# Patient Record
Sex: Male | Born: 1940
Health system: Southern US, Community
[De-identification: ages and names within clinical notes are randomized; demographics above are authoritative.]

## PROBLEM LIST (undated history)

## (undated) DIAGNOSIS — M1990 Unspecified osteoarthritis, unspecified site: Secondary | ICD-10-CM

## (undated) DIAGNOSIS — R748 Abnormal levels of other serum enzymes: Secondary | ICD-10-CM

## (undated) DIAGNOSIS — Z9289 Personal history of other medical treatment: Secondary | ICD-10-CM

## (undated) DIAGNOSIS — D696 Thrombocytopenia, unspecified: Secondary | ICD-10-CM

## (undated) DIAGNOSIS — K5790 Diverticulosis of intestine, part unspecified, without perforation or abscess without bleeding: Secondary | ICD-10-CM

## (undated) DIAGNOSIS — D869 Sarcoidosis, unspecified: Secondary | ICD-10-CM

## (undated) DIAGNOSIS — I10 Essential (primary) hypertension: Secondary | ICD-10-CM

## (undated) DIAGNOSIS — H18519 Endothelial corneal dystrophy, unspecified eye: Secondary | ICD-10-CM

## (undated) DIAGNOSIS — J42 Unspecified chronic bronchitis: Secondary | ICD-10-CM

## (undated) DIAGNOSIS — J189 Pneumonia, unspecified organism: Secondary | ICD-10-CM

## (undated) DIAGNOSIS — D500 Iron deficiency anemia secondary to blood loss (chronic): Secondary | ICD-10-CM

## (undated) DIAGNOSIS — E785 Hyperlipidemia, unspecified: Secondary | ICD-10-CM

## (undated) DIAGNOSIS — I453 Trifascicular block: Secondary | ICD-10-CM

## (undated) DIAGNOSIS — C61 Malignant neoplasm of prostate: Secondary | ICD-10-CM

## (undated) DIAGNOSIS — Z86010 Personal history of colonic polyps: Secondary | ICD-10-CM

## (undated) DIAGNOSIS — C4491 Basal cell carcinoma of skin, unspecified: Secondary | ICD-10-CM

## (undated) DIAGNOSIS — H1851 Endothelial corneal dystrophy: Secondary | ICD-10-CM

## (undated) DIAGNOSIS — C180 Malignant neoplasm of cecum: Secondary | ICD-10-CM

## (undated) DIAGNOSIS — I2510 Atherosclerotic heart disease of native coronary artery without angina pectoris: Secondary | ICD-10-CM

## (undated) HISTORY — DX: Abnormal levels of other serum enzymes: R74.8

## (undated) HISTORY — DX: Iron deficiency anemia secondary to blood loss (chronic): D50.0

## (undated) HISTORY — PX: CORONARY ANGIOPLASTY WITH STENT PLACEMENT: SHX49

## (undated) HISTORY — PX: MOHS SURGERY: SUR867

## (undated) HISTORY — DX: Hyperlipidemia, unspecified: E78.5

## (undated) HISTORY — DX: Malignant neoplasm of cecum: C18.0

## (undated) HISTORY — DX: Essential (primary) hypertension: I10

## (undated) HISTORY — DX: Trifascicular block: I45.3

## (undated) HISTORY — DX: Thrombocytopenia, unspecified: D69.6

## (undated) HISTORY — DX: Sarcoidosis, unspecified: D86.9

## (undated) HISTORY — PX: CATARACT EXTRACTION W/ INTRAOCULAR LENS  IMPLANT, BILATERAL: SHX1307

## (undated) HISTORY — PX: TONSILLECTOMY: SUR1361

## (undated) HISTORY — PX: LYMPH NODE BIOPSY: SHX201

## (undated) HISTORY — PX: INSERTION PROSTATE RADIATION SEED: SUR718

## (undated) HISTORY — DX: Diverticulosis of intestine, part unspecified, without perforation or abscess without bleeding: K57.90

## (undated) HISTORY — DX: Personal history of colonic polyps: Z86.010

## (undated) HISTORY — PX: BASAL CELL CARCINOMA EXCISION: SHX1214

## (undated) HISTORY — PX: THOROCOTOMY WITH LOBECTOMY: SHX6118

## (undated) HISTORY — PX: CORNEAL TRANSPLANT: SHX108

## (undated) HISTORY — PX: EYE SURGERY: SHX253

## (undated) HISTORY — PX: COLONOSCOPY W/ POLYPECTOMY: SHX1380

## (undated) SURGERY — THORACENTESIS
Anesthesia: LOCAL | Laterality: Right

---

## 2000-02-07 ENCOUNTER — Ambulatory Visit: Admission: RE | Admit: 2000-02-07 | Discharge: 2000-02-07 | Payer: Self-pay | Admitting: Critical Care Medicine

## 2003-09-25 ENCOUNTER — Encounter: Admission: RE | Admit: 2003-09-25 | Discharge: 2003-09-25 | Payer: Self-pay | Admitting: Critical Care Medicine

## 2003-12-17 ENCOUNTER — Emergency Department (HOSPITAL_COMMUNITY): Admission: EM | Admit: 2003-12-17 | Discharge: 2003-12-17 | Payer: Self-pay | Admitting: Emergency Medicine

## 2003-12-25 ENCOUNTER — Emergency Department (HOSPITAL_COMMUNITY): Admission: EM | Admit: 2003-12-25 | Discharge: 2003-12-25 | Payer: Self-pay

## 2004-10-31 ENCOUNTER — Ambulatory Visit: Payer: Self-pay

## 2004-10-31 ENCOUNTER — Encounter: Payer: Self-pay | Admitting: Internal Medicine

## 2005-05-12 DIAGNOSIS — Z860101 Personal history of adenomatous and serrated colon polyps: Secondary | ICD-10-CM

## 2005-05-12 DIAGNOSIS — K5790 Diverticulosis of intestine, part unspecified, without perforation or abscess without bleeding: Secondary | ICD-10-CM

## 2005-05-12 DIAGNOSIS — Z86010 Personal history of colonic polyps: Secondary | ICD-10-CM

## 2005-05-12 HISTORY — DX: Diverticulosis of intestine, part unspecified, without perforation or abscess without bleeding: K57.90

## 2005-05-12 HISTORY — DX: Personal history of adenomatous and serrated colon polyps: Z86.0101

## 2005-05-12 HISTORY — DX: Personal history of colonic polyps: Z86.010

## 2005-05-26 ENCOUNTER — Ambulatory Visit: Payer: Self-pay | Admitting: Internal Medicine

## 2005-06-08 ENCOUNTER — Encounter (INDEPENDENT_AMBULATORY_CARE_PROVIDER_SITE_OTHER): Payer: Self-pay | Admitting: Specialist

## 2005-06-08 ENCOUNTER — Ambulatory Visit: Payer: Self-pay | Admitting: Internal Medicine

## 2008-05-29 ENCOUNTER — Emergency Department: Payer: Self-pay | Admitting: Emergency Medicine

## 2009-06-12 DIAGNOSIS — C61 Malignant neoplasm of prostate: Secondary | ICD-10-CM

## 2009-06-12 HISTORY — DX: Malignant neoplasm of prostate: C61

## 2009-07-06 ENCOUNTER — Ambulatory Visit: Admission: RE | Admit: 2009-07-06 | Discharge: 2009-10-04 | Payer: Self-pay | Admitting: Radiation Oncology

## 2009-07-26 ENCOUNTER — Encounter: Admission: RE | Admit: 2009-07-26 | Discharge: 2009-07-26 | Payer: Self-pay | Admitting: Urology

## 2009-08-30 ENCOUNTER — Ambulatory Visit (HOSPITAL_BASED_OUTPATIENT_CLINIC_OR_DEPARTMENT_OTHER): Admission: RE | Admit: 2009-08-30 | Discharge: 2009-08-30 | Payer: Self-pay | Admitting: Urology

## 2009-10-20 ENCOUNTER — Ambulatory Visit: Admission: RE | Admit: 2009-10-20 | Discharge: 2009-10-24 | Payer: Self-pay | Admitting: Radiation Oncology

## 2010-09-05 LAB — COMPREHENSIVE METABOLIC PANEL
AST: 32 U/L (ref 0–37)
Albumin: 4.1 g/dL (ref 3.5–5.2)
Alkaline Phosphatase: 53 U/L (ref 39–117)
BUN: 17 mg/dL (ref 6–23)
CO2: 31 mEq/L (ref 19–32)
Chloride: 106 mEq/L (ref 96–112)
Creatinine, Ser: 0.92 mg/dL (ref 0.4–1.5)
GFR calc non Af Amer: >60 mL/min (ref >60)
Potassium: 4.4 mEq/L (ref 3.5–5.1)
Total Bilirubin: 0.9 mg/dL (ref 0.3–1.2)

## 2010-09-05 LAB — APTT: aPTT: 36 seconds (ref 24–37)

## 2010-09-05 LAB — CBC
MCV: 96.4 fL (ref 78.0–100.0)
Platelets: 134 10*3/uL — ABNORMAL LOW (ref 150–400)
WBC: 5.7 10*3/uL (ref 4.0–10.5)

## 2010-09-05 LAB — PROTIME-INR: Prothrombin Time: 12.8 seconds (ref 11.6–15.2)

## 2010-10-04 ENCOUNTER — Ambulatory Visit (AMBULATORY_SURGERY_CENTER): Payer: Medicare Other

## 2010-10-04 VITALS — Ht 72.0 in | Wt 226.0 lb

## 2010-10-04 DIAGNOSIS — Z86010 Personal history of colonic polyps: Secondary | ICD-10-CM

## 2010-10-04 MED ORDER — PEG-KCL-NACL-NASULF-NA ASC-C 100 G PO SOLR: 1.0000 | Freq: Once | ORAL | Status: AC

## 2010-10-17 ENCOUNTER — Encounter: Payer: Self-pay | Admitting: Internal Medicine

## 2010-10-18 ENCOUNTER — Encounter: Payer: Self-pay | Admitting: Internal Medicine

## 2010-10-18 ENCOUNTER — Ambulatory Visit (AMBULATORY_SURGERY_CENTER): Payer: Medicare Other | Admitting: Internal Medicine

## 2010-10-18 VITALS — HR 72 | Temp 97.9°F | Resp 16 | Ht 72.0 in | Wt 216.0 lb

## 2010-10-18 DIAGNOSIS — Z1211 Encounter for screening for malignant neoplasm of colon: Secondary | ICD-10-CM

## 2010-10-18 DIAGNOSIS — K5730 Diverticulosis of large intestine without perforation or abscess without bleeding: Secondary | ICD-10-CM

## 2010-10-18 DIAGNOSIS — Z86010 Personal history of colonic polyps: Secondary | ICD-10-CM

## 2010-10-18 MED ORDER — SODIUM CHLORIDE 0.9 % IV SOLN: 500.0000 mL | INTRAVENOUS | Status: DC

## 2010-10-18 NOTE — Patient Instructions (Signed)
Discharged instructions given with verbal understanding.  Handouts on diverticulosis and hemorrhoids given. Resume previous medications.

## 2010-10-19 ENCOUNTER — Telehealth: Payer: Self-pay | Admitting: *Deleted

## 2010-10-19 NOTE — Telephone Encounter (Signed)
Follow up Call- Patient questions:  Do you have a fever, pain , or abdominal swelling? no Pain Score  0 *  Have you tolerated food without any problems? yes  Have you been able to return to your normal activities? yes  Do you have any questions about your discharge instructions: Diet   no Medications  no Follow up visit  no  Do you have questions or concerns about your Care? no  Actions: * If pain score is 4 or above: No action needed, pain <4.  Patient states he had small amt of blood in stool this morning. He said it was a very small amt and no other symptoms. He did not seemed concern with this but i did encourage him to call us back if the continues or if it is greater amt. He understands. Sherren Kerns

## 2012-01-18 ENCOUNTER — Ambulatory Visit
Admission: RE | Admit: 2012-01-18 | Discharge: 2012-01-18 | Disposition: A | Discharge status: Home / Self Care | Payer: Medicare Other | Source: Ambulatory Visit | Attending: Physician Assistant | Admitting: Physician Assistant

## 2012-01-18 ENCOUNTER — Other Ambulatory Visit: Payer: Self-pay | Admitting: Physician Assistant

## 2012-01-18 DIAGNOSIS — R55 Syncope and collapse: Secondary | ICD-10-CM

## 2012-01-18 MED ORDER — IOHEXOL 300 MG/ML  SOLN
75.0000 mL | Freq: Once | INTRAMUSCULAR | Status: AC | PRN
  Administered 2012-01-18: 75 mL via INTRAVENOUS

## 2012-01-23 ENCOUNTER — Other Ambulatory Visit: Payer: Self-pay | Admitting: Physician Assistant

## 2012-01-23 DIAGNOSIS — H538 Other visual disturbances: Secondary | ICD-10-CM

## 2012-01-24 ENCOUNTER — Ambulatory Visit
Admission: RE | Admit: 2012-01-24 | Discharge: 2012-01-24 | Disposition: A | Discharge status: Home / Self Care | Payer: Medicare Other | Source: Ambulatory Visit | Attending: Physician Assistant | Admitting: Physician Assistant

## 2012-01-24 ENCOUNTER — Other Ambulatory Visit: Payer: Self-pay | Admitting: Physician Assistant

## 2012-01-24 DIAGNOSIS — H538 Other visual disturbances: Secondary | ICD-10-CM

## 2012-01-24 MED ORDER — GADOBENATE DIMEGLUMINE 529 MG/ML IV SOLN
20.0000 mL | Freq: Once | INTRAVENOUS | Status: AC | PRN
  Administered 2012-01-24: 20 mL via INTRAVENOUS

## 2012-01-29 ENCOUNTER — Encounter: Payer: Self-pay | Admitting: Cardiology

## 2012-01-29 ENCOUNTER — Ambulatory Visit (INDEPENDENT_AMBULATORY_CARE_PROVIDER_SITE_OTHER): Payer: Medicare Other | Admitting: Cardiology

## 2012-01-29 VITALS — BP 160/60 | HR 68 | Ht 72.0 in | Wt 229.8 lb

## 2012-01-29 DIAGNOSIS — R55 Syncope and collapse: Secondary | ICD-10-CM

## 2012-01-29 NOTE — Progress Notes (Signed)
HPI The patient presents as a new patient for evaluation of syncope. He has no past cardiac history though he has significant cardiovascular risk factors. About 2 weeks ago he was playing racket ball. He saw everything going gray. He then slumped against the back wall and slid down to the ground almost completely losing consciousness but not quite. He did have an evaluation which included a head CT which I have reviewed which demonstrated some small vessel mild ischemic changes. A few days later he tried to play again and during the second game had some lightheadedness. Evaluation following this included an MRI MRA which demonstrated some mild left anterior cerebral artery narrowing, narrowing of the proximal right subclavian and narrowing of the right internal carotid artery. There were some changes consistent with his previous history of sarcoid but no obvious etiology for the event.  The patient does not report any palpitations. He has had no chest pressure, neck or arm discomfort. He has had no new shortness of breath, PND or orthopnea. He has had no weight gain or edema.  No Known Allergies  Current Outpatient Prescriptions  Medication Sig Dispense Refill  . aspirin 81 MG tablet Take 81 mg by mouth daily.        . celecoxib (CELEBREX) 200 MG capsule Take 200 mg by mouth daily.      . Ezetimibe-Simvastatin (VYTORIN PO) Take 1 tablet by mouth daily.        [name] glucosamine-chondroitin 500-400 MG tablet Take 1 tablet by mouth daily.       [name] KRILL OIL 1000 MG CAPS Take 1,000 mg by mouth daily.      [name] LEVITRA 20 MG tablet 1 tablet as needed.      [name] losartan (COZAAR) 50 MG tablet Take 50 mg by mouth daily.        . Multiple Vitamins-Minerals (MULTIVITAMIN WITH IRON-MINERALS) liquid Take by mouth daily. Centrum mens MVI Daily        Current Facility-Administered Medications  Medication Dose Route Frequency Provider Last Rate Last Dose  . 0.9 %  sodium chloride infusion  500 mL Intravenous  Continuous Iva Boop, MD        Past Medical History  Diagnosis Date  . Hypertension   . Hyperlipidemia   . Sarcoidosis   . Cataract   . Skin cancer     left shoulder.and mid chest  . History of adenomatous polyp of colon 05/2005    8 mm adenoma    Past Surgical History  Procedure Date  . Tosilectomy   . Lung surgery     partial removal right lung  . Colonoscopy w/ polypectomy 06/08/2005 and 10/18/2010    8 mm adenoma 2006, none 2012. Diverticulosis and internal hemorrhoids.  . Eye surgery     CARCINOMA RIGHT EYELID  . Cataract extraction     WITH CORNEAL TRANSPLANT/ bil  . Lymph node biopsy     mid chest  . Prostate surgery     radiation seed implant  . Corneal transplant     x 2    Family History  Problem Relation Age of Onset  . Heart disease Father 12    Died from "hardening of the arteries" age 71  . Coronary artery disease Sister 87    History   Social History  . Marital Status: Married    Spouse Name: N/A    Number of Children: 2  . Years of Education: N/A   Occupational History  .  Unemployed    Social History Main Topics  . Smoking status: Current Some Day Smoker -- 0.5 packs/day for 45 years    Types: Cigarettes  . Smokeless tobacco: Never Used   Comment: Quit cigarettes 7 years ago but smokes cigars infrequently  . Alcohol Use: 0.5 oz/week    1 drink(s) per week  . Drug Use: No  . Sexually Active: Not on file   Other Topics Concern  . Not on file   Social History Narrative   Lives at home wife.      ROS:  Night sweats.  Otherwise as stated in the HPI and negative for all other systems.  PHYSICAL EXAM BP 160/60  Pulse 68  Ht 6' (1.829 m)  Wt 229 lb 12.8 oz (104.237 kg)  BMI 31.17 kg/m2 GENERAL:  Well appearing HEENT:  Pupils equal round and reactive, fundi not visualized, oral mucosa unremarkable NECK:  No jugular venous distention, waveform within normal limits, carotid upstroke brisk and symmetric, no bruits, no  thyromegaly LYMPHATICS:  No cervical, inguinal adenopathy LUNGS:  Clear to auscultation bilaterally BACK:  No CVA tenderness CHEST:  Unremarkable HEART:  PMI not displaced or sustained,S1 and S2 within normal limits, no S3, no S4, no clicks, no rubs, no murmurs ABD:  Flat, positive bowel sounds normal in frequency in pitch, no bruits, no rebound, no guarding, no midline pulsatile mass, no hepatomegaly, no splenomegaly EXT:  2 plus pulses throughout, no edema, no cyanosis no clubbing SKIN:  No rashes no nodules NEURO:  Cranial nerves II through XII grossly intact, motor grossly intact throughout PSYCH:  Cognitively intact, oriented to person place and time  EKG:  Sinus rhythm, right bundle branch block left anterior fascicular block, rate 68, no acute ST-T wave changes.  01/29/2012  ASSESSMENT AND PLAN  Syncope - The etiology of this patient's event is not clear. He does have significant cardiovascular risk factors.  At this point I will bring him back for an exercise perfusion study. I will also apply a 21 day event monitor particularly given the conduction disturbance on his EKG. Further evaluation will be based on these results.  Hypertension - His blood pressure is elevated today but he says he is otherwise not typically elevated. He will keep a blood pressure diary.  Tobacco use -  We discussed the need to stop smoking all tobacco.

## 2012-01-29 NOTE — Patient Instructions (Addendum)
The current medical regimen is effective;  continue present plan and medications.  Your physician has requested that you have a lexiscan myoview. For further information please visit [email or web address] Please follow instruction sheet, as given.  Your physician has recommended that you wear an event monitor for 21 days. Event monitors are medical devices that record the heart's electrical activity. Doctors most often [place] these monitors to diagnose arrhythmias. Arrhythmias are problems with the speed or rhythm of the heartbeat. The monitor is a small, portable device. You can wear one while you do your normal daily activities. This is usually used to diagnose what is causing palpitations/syncope (passing out).  Follow up with Dr Antoine Poche after all testing has been completed.

## 2012-02-08 ENCOUNTER — Ambulatory Visit (HOSPITAL_COMMUNITY): Payer: Medicare Other | Attending: Cardiovascular Disease | Admitting: Radiology

## 2012-02-08 ENCOUNTER — Encounter (INDEPENDENT_AMBULATORY_CARE_PROVIDER_SITE_OTHER): Payer: Medicare Other

## 2012-02-08 VITALS — BP 148/80 | Ht 72.0 in | Wt 225.0 lb

## 2012-02-08 DIAGNOSIS — R55 Syncope and collapse: Secondary | ICD-10-CM | POA: Insufficient documentation

## 2012-02-08 DIAGNOSIS — I10 Essential (primary) hypertension: Secondary | ICD-10-CM | POA: Insufficient documentation

## 2012-02-08 DIAGNOSIS — R0609 Other forms of dyspnea: Secondary | ICD-10-CM | POA: Insufficient documentation

## 2012-02-08 DIAGNOSIS — R0602 Shortness of breath: Secondary | ICD-10-CM

## 2012-02-08 DIAGNOSIS — F17200 Nicotine dependence, unspecified, uncomplicated: Secondary | ICD-10-CM | POA: Insufficient documentation

## 2012-02-08 DIAGNOSIS — I4510 Unspecified right bundle-branch block: Secondary | ICD-10-CM | POA: Insufficient documentation

## 2012-02-08 DIAGNOSIS — R0989 Other specified symptoms and signs involving the circulatory and respiratory systems: Secondary | ICD-10-CM | POA: Insufficient documentation

## 2012-02-08 MED ORDER — TECHNETIUM TC 99M TETROFOSMIN IV KIT
33.0000 | PACK | Freq: Once | INTRAVENOUS | Status: AC | PRN
  Administered 2012-02-08: 33 via INTRAVENOUS

## 2012-02-08 MED ORDER — TECHNETIUM TC 99M TETROFOSMIN IV KIT
11.0000 | PACK | Freq: Once | INTRAVENOUS | Status: AC | PRN
  Administered 2012-02-08: 11 via INTRAVENOUS

## 2012-02-08 MED ORDER — REGADENOSON 0.4 MG/5ML IV SOLN
0.4000 mg | Freq: Once | INTRAVENOUS | Status: AC
  Administered 2012-02-08: 0.4 mg via INTRAVENOUS

## 2012-02-08 NOTE — Progress Notes (Signed)
[facility] SITE 3 NUCLEAR MED [address] [name] [address] [phone]  Cardiology Nuclear Med Study  Luis [name] is a 71 y.o. male     MRN : [id number]     DOB: 1941/04/24  Procedure Date: 02/08/2012  Nuclear Med Background Indication for Stress Test:  Evaluation for Ischemia History:  2006 MPS Normal EF 61% Cardiac Risk Factors: Family History - CAD, Hypertension, Lipids, RBBB and Smoker  Symptoms:  DOE, Light-Headedness, Near Syncope and Syncope   Nuclear Pre-Procedure Caffeine/Decaff Intake:  None > 12 hrs NPO After: 6:30pm   Lungs:  clear O2 Sat: 97% on room air. IV 0.9% NS with Angio Cath:  20g  IV Site: R Antecubital x 1, tolerated well IV Started by:  Irean Hong, RN  Chest Size (in):  46 Cup Size: n/a  Height: 6' (1.829 m)  Weight:  225 lb (102.059 kg)  BMI:  Body mass index is 30.52 kg/(m^2). Tech Comments:  n/a    Nuclear Med Study 1 or 2 day study: 1 day  Stress Test Type:  Treadmill/Lexiscan  Reading MD: Charlton Haws, MD  Order Authorizing Provider:  Rollene Rotunda, MD  Resting Radionuclide: Technetium 38m Tetrofosmin  Resting Radionuclide Dose: 11.0 mCi   Stress Radionuclide:  Technetium 67m Tetrofosmin  Stress Radionuclide Dose: 33.0 mCi           Stress Protocol Rest HR: 64 Stress HR: 107  Rest BP: 148/80 Stress BP: 179/60  Exercise Time (min): 6:36 METS: 7.0   Predicted Max HR: 150 bpm % Max HR: 71.33 bpm Rate Pressure Product: 46962   Dose of Adenosine (mg):  n/a Dose of Lexiscan: 0.4 mg  Dose of Atropine (mg): n/a Dose of Dobutamine: n/a mcg/kg/min (at max HR)  Stress Test Technologist: Bonnita Levan, RN  Nuclear Technologist:  Domenic Polite, CNMT     Rest Procedure:  Myocardial perfusion imaging was performed at rest 45 minutes following the intravenous administration of Technetium 51m Tetrofosmin. Rest ECG: RBBB  Stress Procedure:  The patient performed treadmill exercise using a Bruce  Protocol for 6:36  minutes. The patient was unable to achieve THR, and c/o dyspnea and fatigue and denied any chest pain. The patient was given IV Lexiscan 0.4 mg over 15-seconds with concurrent low level exercise and then Technetium 33m Tetrofosmin was injected at 30-seconds while the patient continued walking one more minute. There were no significant changes with Lexiscan. Quantitative spect images were obtained after a 45-minute delay. Stress ECG: No significant change from baseline ECG  QPS Raw Data Images:  Normal; no motion artifact; normal heart/lung ratio. Stress Images:  Normal homogeneous uptake in all areas of the myocardium. Rest Images:  Normal homogeneous uptake in all areas of the myocardium. Subtraction (SDS):  Normal Transient Ischemic Dilatation (Normal <1.22):  1.07 Lung/Heart Ratio (Normal <0.45):  0.31  Quantitative Gated Spect Images QGS EDV:  101 ml QGS ESV:  38 ml  Impression Exercise Capacity:  Poor exercise capacity. BP Response:  Normal blood pressure response. Clinical Symptoms:  There is dyspnea. ECG Impression:  No significant ST segment change suggestive of ischemia. Comparison with Prior Nuclear Study: No images to compare  Overall Impression:  Normal stress nuclear study. Baseline ECG with RBBB  LV Ejection Fraction: 62%.  LV Wall Motion:  NL LV Function; NL Wall Motion   Charlton Haws

## 2012-02-19 ENCOUNTER — Other Ambulatory Visit: Payer: Self-pay | Admitting: Diagnostic Neuroimaging

## 2012-02-19 DIAGNOSIS — H5310 Unspecified subjective visual disturbances: Secondary | ICD-10-CM

## 2012-02-19 DIAGNOSIS — R42 Dizziness and giddiness: Secondary | ICD-10-CM

## 2012-02-19 DIAGNOSIS — R51 Headache: Secondary | ICD-10-CM

## 2012-02-19 DIAGNOSIS — D869 Sarcoidosis, unspecified: Secondary | ICD-10-CM

## 2012-03-14 ENCOUNTER — Encounter: Payer: Self-pay | Admitting: Cardiology

## 2012-03-14 ENCOUNTER — Ambulatory Visit (INDEPENDENT_AMBULATORY_CARE_PROVIDER_SITE_OTHER): Payer: Medicare Other | Admitting: Cardiology

## 2012-03-14 VITALS — BP 119/60 | HR 74 | Ht 72.0 in | Wt 228.8 lb

## 2012-03-14 DIAGNOSIS — R55 Syncope and collapse: Secondary | ICD-10-CM

## 2012-03-14 NOTE — Patient Instructions (Addendum)
The current medical regimen is effective;  continue present plan and medications.  Follow up as needed 

## 2012-03-14 NOTE — Progress Notes (Signed)
HPI The patient presents for followup of a syncopal episode. This is described in the previous note. Since I last saw him he has had no frank syncope presyncope though he has had a couple of episodes of lightheadedness. He's had some atypical chest discomfort sporadically sometimes at rest and sometimes with activities. He says this feels like "sandpaper" in his chest. He denies any palpitations. He's had no shortness of breath, PND or orthopnea. He has been able to play racket ball without bringing on any symptoms.  I reviewed with him today an event monitor which demonstrated normal sinus rhythm. There was artifact with a monitor came loose. He had no severe events while wearing this. He also had a stress perfusion study which I reviewed today and discussed at length with him. There was no evidence of ischemia or infarct.  No Known Allergies  Current Outpatient Prescriptions  Medication Sig Dispense Refill  . aspirin 81 MG tablet Take 81 mg by mouth daily.        . celecoxib (CELEBREX) 200 MG capsule Take 200 mg by mouth daily.      . Ezetimibe-Simvastatin (VYTORIN PO) Take 1 tablet by mouth daily.        [name] glucosamine-chondroitin 500-400 MG tablet Take 1 tablet by mouth daily.       [name] KRILL OIL 1000 MG CAPS Take 1,000 mg by mouth daily.      [name] LEVITRA 20 MG tablet 1 tablet as needed.      [name] losartan (COZAAR) 50 MG tablet Take 100 mg by mouth daily.       . Multiple Vitamins-Minerals (MULTIVITAMIN WITH IRON-MINERALS) liquid Take by mouth daily. Centrum mens MVI Daily        Current Facility-Administered Medications  Medication Dose Route Frequency Provider Last Rate Last Dose  . 0.9 %  sodium chloride infusion  500 mL Intravenous Continuous Iva Boop, MD        Past Medical History  Diagnosis Date  . Hypertension   . Hyperlipidemia   . Sarcoidosis   . Cataract   . Skin cancer     left shoulder.and mid chest  . History of adenomatous polyp of colon 05/2005    8 mm adenoma      Past Surgical History  Procedure Date  . Tosilectomy   . Lung surgery     partial removal right lung  . Colonoscopy w/ polypectomy 06/08/2005 and 10/18/2010    8 mm adenoma 2006, none 2012. Diverticulosis and internal hemorrhoids.  . Eye surgery     CARCINOMA RIGHT EYELID  . Cataract extraction     WITH CORNEAL TRANSPLANT/ bil  . Lymph node biopsy     mid chest  . Prostate surgery     radiation seed implant  . Corneal transplant     x 2    ROS:  As stated in the HPI and negative for all other systems.  PHYSICAL EXAM BP 119/60  Pulse 74  Ht 6' (1.829 m)  Wt 103.783 kg (228 lb 12.8 oz)  BMI 31.03 kg/m2 GENERAL:  Well appearing HEENT:  Pupils anisocoric , fundi not visualized, oral mucosa unremarkable NECK:  No jugular venous distention, waveform within normal limits, carotid upstroke brisk and symmetric, no bruits, no thyromegaly LYMPHATICS:  No cervical, inguinal adenopathy LUNGS:  Clear to auscultation bilaterally BACK:  No CVA tenderness CHEST:  Unremarkable HEART:  PMI not displaced or sustained,S1 and S2 within normal limits, no S3, no S4, no  clicks, no rubs, no murmurs ABD:  Flat, positive bowel sounds normal in frequency in pitch, no bruits, no rebound, no guarding, no midline pulsatile mass, no hepatomegaly, no splenomegaly EXT:  2 plus pulses throughout, no edema, no cyanosis no clubbing   ASSESSMENT AND PLAN  Syncope -  At this point in the absence of further events and with the negative studies listed above no further testing is indicated. We reviewed this at length. He will let me know if he has recurrent symptoms or change in his chest discomfort and I would reevaluate this given his risk factors.  Hypertension -  The blood pressure is at target. No change in medications is indicated. We will continue with therapeutic lifestyle changes (TLC).  Tobacco use -  We discussed the need to stop smoking all tobacco.

## 2012-04-11 NOTE — Progress Notes (Signed)
Blood drawn for labs for MRI (to be done 04/23/12); from right [facility] space without difficulty; site unremarkable.  dd

## 2012-04-23 ENCOUNTER — Other Ambulatory Visit: Payer: Medicare Other

## 2012-04-23 ENCOUNTER — Ambulatory Visit
Admission: RE | Admit: 2012-04-23 | Discharge: 2012-04-23 | Disposition: A | Discharge status: Home / Self Care | Payer: Medicare Other | Source: Ambulatory Visit | Attending: Diagnostic Neuroimaging | Admitting: Diagnostic Neuroimaging

## 2012-04-23 DIAGNOSIS — R42 Dizziness and giddiness: Secondary | ICD-10-CM

## 2012-04-23 DIAGNOSIS — H5310 Unspecified subjective visual disturbances: Secondary | ICD-10-CM

## 2012-04-23 DIAGNOSIS — R51 Headache: Secondary | ICD-10-CM

## 2012-04-23 DIAGNOSIS — D869 Sarcoidosis, unspecified: Secondary | ICD-10-CM

## 2012-04-23 MED ORDER — GADOBENATE DIMEGLUMINE 529 MG/ML IV SOLN
20.0000 mL | Freq: Once | INTRAVENOUS | Status: AC | PRN
  Administered 2012-04-23: 20 mL via INTRAVENOUS

## 2012-06-12 HISTORY — PX: FINGER SURGERY: SHX640

## 2013-06-12 HISTORY — PX: LAPAROSCOPIC CHOLECYSTECTOMY: SUR755

## 2013-08-03 ENCOUNTER — Emergency Department (HOSPITAL_COMMUNITY)
Admission: EM | Admit: 2013-08-03 | Discharge: 2013-08-04 | Disposition: A | Discharge status: Home / Self Care | Payer: Medicare Other | Attending: Emergency Medicine | Admitting: Emergency Medicine

## 2013-08-03 ENCOUNTER — Emergency Department (HOSPITAL_COMMUNITY): Payer: Medicare Other

## 2013-08-03 ENCOUNTER — Encounter (HOSPITAL_COMMUNITY): Payer: Self-pay | Admitting: Emergency Medicine

## 2013-08-03 DIAGNOSIS — Z860100 Personal history of colon polyps, unspecified: Secondary | ICD-10-CM | POA: Insufficient documentation

## 2013-08-03 DIAGNOSIS — E785 Hyperlipidemia, unspecified: Secondary | ICD-10-CM | POA: Insufficient documentation

## 2013-08-03 DIAGNOSIS — Z85828 Personal history of other malignant neoplasm of skin: Secondary | ICD-10-CM | POA: Insufficient documentation

## 2013-08-03 DIAGNOSIS — Z7982 Long term (current) use of aspirin: Secondary | ICD-10-CM | POA: Insufficient documentation

## 2013-08-03 DIAGNOSIS — I10 Essential (primary) hypertension: Secondary | ICD-10-CM | POA: Insufficient documentation

## 2013-08-03 DIAGNOSIS — Z8619 Personal history of other infectious and parasitic diseases: Secondary | ICD-10-CM | POA: Insufficient documentation

## 2013-08-03 DIAGNOSIS — Z8669 Personal history of other diseases of the nervous system and sense organs: Secondary | ICD-10-CM | POA: Insufficient documentation

## 2013-08-03 DIAGNOSIS — Z79899 Other long term (current) drug therapy: Secondary | ICD-10-CM | POA: Insufficient documentation

## 2013-08-03 DIAGNOSIS — F17200 Nicotine dependence, unspecified, uncomplicated: Secondary | ICD-10-CM | POA: Insufficient documentation

## 2013-08-03 DIAGNOSIS — K859 Acute pancreatitis without necrosis or infection, unspecified: Secondary | ICD-10-CM | POA: Insufficient documentation

## 2013-08-03 LAB — BASIC METABOLIC PANEL
BUN: 15 mg/dL (ref 6–23)
CALCIUM: 9.2 mg/dL (ref 8.4–10.5)
CO2: 27 mEq/L (ref 19–32)
Chloride: 101 mEq/L (ref 96–112)
Creatinine, Ser: 0.88 mg/dL (ref 0.50–1.35)
GFR calc Af Amer: >90 mL/min (ref >90)
GFR, EST NON AFRICAN AMERICAN: 84 mL/min — AB (ref >90)
GLUCOSE: 94 mg/dL (ref 70–99)
Potassium: 4.3 mEq/L (ref 3.7–5.3)
Sodium: 139 mEq/L (ref 137–147)

## 2013-08-03 LAB — HEPATIC FUNCTION PANEL
ALBUMIN: 3.9 g/dL (ref 3.5–5.2)
ALK PHOS: 71 U/L (ref 39–117)
ALT: 31 U/L (ref 0–53)
AST: 34 U/L (ref 0–37)
Bilirubin, Direct: <0.2 mg/dL (ref 0.0–0.3)
TOTAL PROTEIN: 6.9 g/dL (ref 6.0–8.3)
Total Bilirubin: 0.5 mg/dL (ref 0.3–1.2)

## 2013-08-03 LAB — CBC
HEMATOCRIT: 41.5 % (ref 39.0–52.0)
HEMOGLOBIN: 14.8 g/dL (ref 13.0–17.0)
MCH: 32.7 pg (ref 26.0–34.0)
MCHC: 35.7 g/dL (ref 30.0–36.0)
MCV: 91.6 fL (ref 78.0–100.0)
Platelets: 131 10*3/uL — ABNORMAL LOW (ref 150–400)
RBC: 4.53 MIL/uL (ref 4.22–5.81)
RDW: 13.3 % (ref 11.5–15.5)
WBC: 5.9 10*3/uL (ref 4.0–10.5)

## 2013-08-03 LAB — LIPASE, BLOOD: Lipase: 175 U/L — ABNORMAL HIGH (ref 11–59)

## 2013-08-03 LAB — I-STAT TROPONIN, ED
Troponin i, poc: 0 ng/mL (ref 0.00–0.08)
Troponin i, poc: 0.01 ng/mL (ref 0.00–0.08)

## 2013-08-03 LAB — PRO B NATRIURETIC PEPTIDE: Pro B Natriuretic peptide (BNP): 201.2 pg/mL — ABNORMAL HIGH (ref 0–125)

## 2013-08-03 NOTE — ED Provider Notes (Signed)
CSN: [id number]     Arrival date & time 08/03/13  1835 History   First MD Initiated Contact with Patient 08/03/13 1904     Chief Complaint  Patient presents with  . Chest Pain     (Consider location/radiation/quality/duration/timing/severity/associated sxs/prior Treatment) HPI Comments: Patient states, that he's been having intermittent episodes of a tearing sensation in his left chest that wraps around to his left mid back intermittently throughout the day, lasting seconds to a few minutes.  Not precipitated by breathing, movement, lifting, pushing, pulling.  This is associated with momentary, shortness of breath.  No diaphoresis, no nausea. He, states he's had this pain, on and off for several years, but this is the first, time it's lasted long enough for him to become concerned. He does have a medical history of high cholesterol, hypertension, sarcoidosis, for which she had a partial, lung resection in early 2003, and has been discharged from care by pulmonology. He also stated that at some point in 2013.  He had a syncopal episode where he was seen by cardiology and had a negative stress test. Patient denies any history of DVTs, recent travel, lower extremity edema.  Cancers. Patient states he is in occurs in all drinker of a beer or 2, but not on a daily basis.   Patient is a 73 y.o. male presenting with chest pain. The history is provided by the patient.  Chest Pain Pain location:  L chest Pain quality: tearing   Pain radiates to:  Mid back Pain radiates to the back: yes   Pain severity:  Moderate Onset quality:  Gradual Duration:  8 hours Timing:  Intermittent Progression:  Unchanged Chronicity:  Recurrent Context: not breathing, not eating, not lifting, no movement, not at rest and no trauma   Relieved by:  None tried Worsened by:  Nothing tried Ineffective treatments:  None tried Associated symptoms: no abdominal pain, no anorexia, no anxiety, no back pain, no dizziness, no  fatigue, no fever, no heartburn, no lower extremity edema, no nausea, no near-syncope, no numbness, no orthopnea, no shortness of breath, no syncope, not vomiting and no weakness   Risk factors: high cholesterol and male sex   Risk factors: no aortic disease, no coronary artery disease, no diabetes mellitus, no hypertension, no immobilization, no Marfan's syndrome, not obese, no prior DVT/PE, no smoking and no surgery   Risk factors comment:  Occasional cigar   Past Medical History  Diagnosis Date  . Hypertension   . Hyperlipidemia   . Sarcoidosis   . Cataract   . Skin cancer     left shoulder.and mid chest  . History of adenomatous polyp of colon 05/2005    8 mm adenoma   Past Surgical History  Procedure Laterality Date  . Tosilectomy    . Lung surgery      partial removal right lung  . Colonoscopy w/ polypectomy  06/08/2005 and 10/18/2010    8 mm adenoma 2006, none 2012. Diverticulosis and internal hemorrhoids.  . Eye surgery      CARCINOMA RIGHT EYELID  . Cataract extraction      WITH CORNEAL TRANSPLANT/ bil  . Lymph node biopsy      mid chest  . Prostate surgery      radiation seed implant  . Corneal transplant      x 2   Family History  Problem Relation Age of Onset  . Heart disease Father 27    Died from "hardening of the arteries" age 60  .  Coronary artery disease Sister 24   History  Substance Use Topics  . Smoking status: Current Some Day Smoker -- 0.50 packs/day for 45 years    Types: Cigarettes  . Smokeless tobacco: Never Used     Comment: Quit cigarettes 7 years ago but smokes cigars infrequently  . Alcohol Use: 0.5 oz/week    1 drink(s) per week    Review of Systems  Constitutional: Negative for fever and fatigue.  HENT: Negative for rhinorrhea.   Respiratory: Negative for shortness of breath.   Cardiovascular: Positive for chest pain. Negative for orthopnea, leg swelling, syncope and near-syncope.  Gastrointestinal: Negative for heartburn, nausea,  vomiting, abdominal pain, diarrhea and anorexia.  Musculoskeletal: Negative for back pain and myalgias.  Neurological: Negative for dizziness, weakness and numbness.  All other systems reviewed and are negative.      Allergies  Review of patient's allergies indicates no known allergies.  Home Medications   Current Outpatient Rx  Name  Route  Sig  Dispense  Refill  . aspirin 81 MG tablet   Oral   Take 81 mg by mouth daily.           . celecoxib (CELEBREX) 200 MG capsule   Oral   Take 200 mg by mouth 2 (two) times daily.          . Cholecalciferol (VITAMIN D-3) 1000 UNITS CAPS   Oral   Take 2,000 Units by mouth daily.         . clotrimazole-betamethasone (LOTRISONE) cream   Topical   Apply 1 application topically 2 (two) times daily as needed (for rash).         . ezetimibe (ZETIA) 10 MG tablet   Oral   Take 10 mg by mouth daily.         . fluocinonide ointment (LIDEX) 0.05 %   Topical   Apply 1 application topically 2 (two) times daily as needed (for rash).         [name] glucosamine-chondroitin 500-400 MG tablet   Oral   Take 1 tablet by mouth daily.          [name] KRILL OIL 1000 MG CAPS   Oral   Take 1,000 mg by mouth daily.         [name] LEVITRA 20 MG tablet   Oral   Take 1 tablet by mouth daily as needed for erectile dysfunction.          [name] losartan (COZAAR) 25 MG tablet   Oral   Take 25 mg by mouth every morning.         . Multiple Vitamins-Minerals (MULTIVITAMIN WITH IRON-MINERALS) liquid   Oral   Take by mouth daily. Centrum mens MVI Daily         . pravastatin (PRAVACHOL) 20 MG tablet   Oral   Take 20 mg by mouth daily.          BP 161/76  Pulse 98  Temp(Src) 98.4 F (36.9 C) (Oral)  Resp 66  SpO2 94% Physical Exam  Nursing note and vitals reviewed. Constitutional: He is oriented to person, place, and time. He appears well-developed and well-nourished.  HENT:  Head: Normocephalic.  Eyes: Pupils are equal, round, and  reactive to light.  Neck: Normal range of motion.  Cardiovascular: Normal rate and regular rhythm.   Pulmonary/Chest: Effort normal and breath sounds normal.  Abdominal: Soft. He exhibits no distension. There is no tenderness.  Musculoskeletal: Normal range of motion. He exhibits no  edema.  Neurological: He is alert and oriented to person, place, and time.  Skin: Skin is warm. No pallor.    ED Course  Procedures (including critical care time) Labs Review Labs Reviewed  CBC - Abnormal; Notable for the following:    Platelets 131 (*)    All other components within normal limits  BASIC METABOLIC PANEL - Abnormal; Notable for the following:    GFR calc non Af Amer 84 (*)    All other components within normal limits  PRO B NATRIURETIC PEPTIDE - Abnormal; Notable for the following:    Pro B Natriuretic peptide (BNP) 201.2 (*)    All other components within normal limits  LIPASE, BLOOD - Abnormal; Notable for the following:    Lipase 175 (*)    All other components within normal limits  HEPATIC FUNCTION PANEL  I-STAT TROPOININ, ED  I-STAT TROPOININ, ED   Imaging Review  patient has had 2 sets of negative cardiac markers.  His CBC and electrolytes have been reviewed.  His LFTs are normal.  It is noted, that his lipase is 175, which perhaps is the cause for his left chest pain, radiating to his left scapula, but after being seen by Dr. Wyvonnia Dusky a d-dimer has been added and waiting for the results of this, although his risk factors for a PE are minimal Discussed at length lab results, and working Dx fo pancreatitis  Pantien  has been advised to avoid all ETOH and not to smoke He has been referred to GI for follow up evaluation MDM   Final diagnoses:  Pancreatitis        Garald Balding, NP 08/04/13 0102

## 2013-08-03 NOTE — ED Notes (Signed)
Per pt sts chest pain that started about 2 hours ago. sts under his left breast and radiating to left arm and some left arm numbness. sts the pain is constant. sts some SOB.

## 2013-08-03 NOTE — ED Notes (Signed)
Patient transported to X-ray 

## 2013-08-04 ENCOUNTER — Other Ambulatory Visit (INDEPENDENT_AMBULATORY_CARE_PROVIDER_SITE_OTHER): Payer: Medicare Other

## 2013-08-04 ENCOUNTER — Encounter: Payer: Self-pay | Admitting: Internal Medicine

## 2013-08-04 ENCOUNTER — Ambulatory Visit (INDEPENDENT_AMBULATORY_CARE_PROVIDER_SITE_OTHER): Payer: Medicare Other | Admitting: Internal Medicine

## 2013-08-04 VITALS — BP 158/80 | HR 72 | Ht 72.0 in | Wt 238.0 lb

## 2013-08-04 DIAGNOSIS — R748 Abnormal levels of other serum enzymes: Secondary | ICD-10-CM

## 2013-08-04 DIAGNOSIS — R10812 Left upper quadrant abdominal tenderness: Secondary | ICD-10-CM

## 2013-08-04 DIAGNOSIS — R079 Chest pain, unspecified: Secondary | ICD-10-CM

## 2013-08-04 LAB — LIPASE: Lipase: 46 U/L (ref 11.0–59.0)

## 2013-08-04 LAB — AMYLASE: Amylase: 111 U/L (ref 27–131)

## 2013-08-04 NOTE — Patient Instructions (Addendum)
Your physician has requested that you go to the basement for the following lab work before leaving today: Amylase, Lipase  You have been scheduled for a CT scan of the abdomen  at [place].You are scheduled on 08/06/13 at 11:30am. You should arrive 15 minutes prior to your appointment time for registration. Please follow the written instructions below on the day of your exam:  WARNING: IF YOU ARE ALLERGIC TO IODINE/X-RAY DYE, PLEASE NOTIFY RADIOLOGY IMMEDIATELY AT [phone]! YOU WILL BE GIVEN A 13 HOUR PREMEDICATION PREP.  1) Do not eat or drink anything after 7:30am (4 hours prior to your test) 2) You have been given 2 bottles of oral contrast to drink. The solution may taste better if refrigerated, but do NOT add ice or any other liquid to this solution. Shake  well before drinking.    Drink 1 bottle of contrast @ 9:30am (2 hours prior to your exam)  Drink 1 bottle of contrast @ 10:30am (1 hour prior to your exam)  You may take any medications as prescribed with a small amount of water except for the following: Metformin, Glucophage, Glucovance, Avandamet, Riomet, Fortamet, Actoplus Met, Janumet, Glumetza or Metaglip. The above medications must be held the day of the exam AND 48 hours after the exam.  The purpose of you drinking the oral contrast is to aid in the visualization of your intestinal tract. The contrast solution may cause some diarrhea. Before your exam is started, you will be given a small amount of fluid to drink. Depending on your individual set of symptoms, you may also receive an intravenous injection of x-ray contrast/dye. Plan on being at [facility] for 30-45 minutes, depending on the type of exam you are having performed.     I appreciate the opportunity to care for you.  If you have any questions regarding your exam or if you need to reschedule, you may call the CT department at [phone] between the hours of 8:00 am and 5:00 pm,  Monday-Friday.  ________________________________________________________________________    I appreciate the opportunity to care for you.

## 2013-08-04 NOTE — Progress Notes (Signed)
Subjective:    Patient ID: Luis [name], male    DOB: 1941/05/31, 73 y.o.   MRN: [id number]  HPI The patient is here for evaluation after he was in the emergency department yesterday evening, because of chest pain. He had intermittent mild to moderate chest pain in the left inframammary area for about 7 or 8 hours. He has had problems like this before, off and on without obvious trigger. His lipase was elevated and he was told he had pancreatitis. He was no associated diaphoresis, dyspnea or nausea or vomiting. The pain would last for minutes at a time and then disappear and then recur.  He feels fine now. Yesterday he which had a [place] salad bleu cheese dressing for lunch, did some shopping and on the way home developed this pain and went to the emergency department because he was concerned that it could be his heart. EKG and cardiac labs were negative. The elevated lipase the conclusion was it was pancreatitis. He is noted some slightly loose stools lately but no other GI problems. I know him from prior colonoscopy and polypectomy. She also has diverticulosis.  No Known Allergies Outpatient Prescriptions Prior to Visit  Medication Sig Dispense Refill  . aspirin 81 MG tablet Take 81 mg by mouth daily.        . celecoxib (CELEBREX) 200 MG capsule Take 200 mg by mouth 2 (two) times daily.       . Cholecalciferol (VITAMIN D-3) 1000 UNITS CAPS Take 2,000 Units by mouth daily.      . clotrimazole-betamethasone (LOTRISONE) cream Apply 1 application topically 2 (two) times daily as needed (for rash).      . ezetimibe (ZETIA) 10 MG tablet Take 10 mg by mouth daily.      . fluocinonide ointment (LIDEX) 1.88 % Apply 1 application topically 2 (two) times daily as needed (for rash).      [name] glucosamine-chondroitin 500-400 MG tablet Take 1 tablet by mouth daily.       [name] KRILL OIL 1000 MG CAPS Take 1,000 mg by mouth daily.      [name] LEVITRA 20 MG tablet Take 1 tablet by mouth daily as needed for  erectile dysfunction.       [name] losartan (COZAAR) 25 MG tablet Take 25 mg by mouth every morning.      . Multiple Vitamins-Minerals (MULTIVITAMIN WITH IRON-MINERALS) liquid Take by mouth daily. Centrum mens MVI Daily      . pravastatin (PRAVACHOL) 20 MG tablet Take 20 mg by mouth daily.       Facility-Administered Medications Prior to Visit  Medication Dose Route Frequency Provider Last Rate Last Dose  . 0.9 %  sodium chloride infusion  500 mL Intravenous Continuous Gatha Mayer, MD       Past Medical History  Diagnosis Date  . Hypertension   . Hyperlipidemia   . Sarcoidosis   . Cataract   . Skin cancer     left shoulder.and mid chest  . History of adenomatous polyp of colon 05/2005    8 mm adenoma  . Elevated lipase    Past Surgical History  Procedure Laterality Date  . Tonsillectomy    . Lung surgery      partial removal right lung  . Colonoscopy w/ polypectomy  06/08/2005 and 10/18/2010    8 mm adenoma 2006, none 2012. Diverticulosis and internal hemorrhoids.  . Eye surgery Right     melanom  . Cataract  extraction Bilateral   . Lymph node biopsy      mid chest  . Prostate surgery      radiation seed implant  . Corneal transplant Bilateral   . Finger surgery Right     middle finger   History   Social History  . Marital Status: Married         Number of Children: 2    Social History Main Topics  . Smoking status: Current Some Day Smoker -- 0.50 packs/day for 45 years    Types: Cigarettes  . Smokeless tobacco: Never Used     Comment: Quit cigarettes 7 years ago but smokes cigars infrequently; form given 08-04-13  . Alcohol Use: 0.5 oz/week    1 drink(s) per week  . Drug Use: No  .     Other Topics Concern  . None   Social History Narrative   Lives at home wife.     Family History  Problem Relation Age of Onset  . Heart disease Father 68    Died from "hardening of the arteries" age 40  . Coronary artery disease Sister 61  . Colon cancer Neg Hx   .  Throat cancer Neg Hx   . Pancreatic cancer Neg Hx   . Prostate cancer Neg Hx        Review of Systems Positive for recent upper respiratory infection, eyeglasses and hearing aids. All other review of systems negative at this time.    Objective:   Physical Exam General:  Well-developed, well-nourished and in no acute distress Eyes:  anicteric. ENT:   Mouth and posterior pharynx free of lesions. Positive upper denture Neck:   supple w/o thyromegaly or mass.  Lungs: Clear to auscultation bilaterally there is a right thoracotomy scar with decreased breath sounds in this area. Heart:  S1S2, no rubs, murmurs, gallops. Abdomen:  Obese, soft, mildly tender left upper quadrant, no hepatosplenomegaly, hernia, or mass and BS+. Faint prominence of the abdominal veins Lymph:  no cervical or supraclavicular adenopathy. Extremities:   no edema Skin   no rash. Neuro:  A&O x 3.  Psych:  appropriate mood and  Affect.   Data Reviewed: Lab Results  Component Value Date   WBC 5.9 08/03/2013   HGB 14.8 08/03/2013   HCT 41.5 08/03/2013   MCV 91.6 08/03/2013   PLT 131* 08/03/2013     Chemistry      Component Value Date/Time   NA 139 08/03/2013 1900   K 4.3 08/03/2013 1900   CL 101 08/03/2013 1900   CO2 27 08/03/2013 1900   BUN 15 08/03/2013 1900   CREATININE 0.88 08/03/2013 1900      Component Value Date/Time   CALCIUM 9.2 08/03/2013 1900   ALKPHOS 71 08/03/2013 2139   AST 34 08/03/2013 2139   ALT 31 08/03/2013 2139   BILITOT 0.5 08/03/2013 2139     Lab Results  Component Value Date   LIPASE 175* 08/03/2013   Chest x-ray showed some chronic scarring and thoracotomy changes.    Assessment & Plan:   1. Elevated lipase   2. Chest pain - left   3. LUQ abdominal tenderness - mild    Cause of these problems not entirely clear to me. He could have pancreatitis. The location of pain is somewhat atypical but there could be some referred pain. It does not sound cardiac. He had a negative chemical  stress test about 18 months ago when he had some syncope. That workup was negative.  I'm going to evaluate with repeat lipase and check an amylase, will also go ahead with a CT of the abdomen with contrast. Further plans pending that. I have decided not to order an ultrasound as he does not have typical gallbladder symptoms, his LFTs were normal also.  CC: [name], MD   Lab Results  Component Value Date   LIPASE 46.0 08/04/2013   Lab Results  Component Value Date   AMYLASE 111 08/04/2013

## 2013-08-04 NOTE — ED Provider Notes (Signed)
Medical screening examination/treatment/procedure(s) were conducted as a shared visit with non-physician practitioner(s) and myself.  I personally evaluated the patient during the encounter.  Episode of "soreness" at left lower ribs, lasting a few minutes at a time.  On and off throughout the day today. Not exertional, no radiation, not pleurtic. No nausea, diaphoresis, syncope. No distress, reading book TTP L lower ribs and upper abdomen, no rash EKG unchanged. Troponin negative x2. Patient reports negative stress 2013. No tachycardia or hypoxia. Pain seems atypical for ACS, lasting a few minutes at a time, EKG unchanged, troponin negative x2. Lipase elevated. Exam not consistent with dissection. Check d-dimer to r/o PE.      Ezequiel Essex, MD 08/04/13 [phone]

## 2013-08-04 NOTE — ED Notes (Signed)
Patient dr    Daiva Eves  MD            Office phone  [phone]     Fax  [phone]

## 2013-08-04 NOTE — Discharge Instructions (Signed)
Acute Pancreatitis Acute pancreatitis is a disease in which the pancreas becomes suddenly irritated (inflamed). The pancreas is a large gland behind your stomach. The pancreas makes enzymes that help digest food. The pancreas also makes 2 hormones that help control your blood sugar. Acute pancreatitis happens when the enzymes attack and damage the pancreas. Most attacks last a couple of days and can cause serious problems. HOME CARE  Follow your doctor's diet instructions. You may need to avoid alcohol and limit fat in your diet.  Eat small meals often.  Drink enough fluids to keep your pee (urine) clear or pale yellow.  Only take medicines as told by your doctor.  Avoid drinking alcohol if it caused your disease.  Do not smoke.  Get plenty of rest.    Keep all doctor visits as told. GET HELP RIGHT AWAY IF:   You are unable to eat or keep fluids down.  Your pain becomes severe.  You have a fever or lasting symptoms for more than 2 to 3 days.  You have a fever and your symptoms suddenly get worse.  Your skin or the white part of your eyes turn yellow (jaundice).  You throw up (vomit).  You feel dizzy, or you pass out (faint).    You do not get better as quickly as expected.  You have new or worsening symptoms.  You have lasting pain, weakness, or feel sick to your stomach (nauseous).  You get better and then have another pain attack. MAKE SURE YOU:   Understand these instructions.  Will watch your condition.  Will get help right away if you are not doing well or get worse. Document Released: 11/15/2007 Document Revised: 11/28/2011 Document Reviewed: 09/07/2011 [facility] Patient Information 2014 [place]. As discussed please avoid all alcoholic beverages, do not smoke.  Make an appointment with [facility] for further evaluation

## 2013-08-06 ENCOUNTER — Ambulatory Visit (HOSPITAL_COMMUNITY)
Admission: RE | Admit: 2013-08-06 | Discharge: 2013-08-06 | Disposition: A | Discharge status: Home / Self Care | Payer: Medicare Other | Source: Ambulatory Visit | Attending: Internal Medicine | Admitting: Internal Medicine

## 2013-08-06 ENCOUNTER — Encounter (HOSPITAL_COMMUNITY): Payer: Self-pay

## 2013-08-06 ENCOUNTER — Other Ambulatory Visit: Payer: Self-pay | Admitting: Internal Medicine

## 2013-08-06 DIAGNOSIS — R1012 Left upper quadrant pain: Secondary | ICD-10-CM | POA: Insufficient documentation

## 2013-08-06 DIAGNOSIS — R10812 Left upper quadrant abdominal tenderness: Secondary | ICD-10-CM

## 2013-08-06 DIAGNOSIS — R748 Abnormal levels of other serum enzymes: Secondary | ICD-10-CM

## 2013-08-06 DIAGNOSIS — K8020 Calculus of gallbladder without cholecystitis without obstruction: Secondary | ICD-10-CM | POA: Insufficient documentation

## 2013-08-06 DIAGNOSIS — J984 Other disorders of lung: Secondary | ICD-10-CM | POA: Insufficient documentation

## 2013-08-06 DIAGNOSIS — R079 Chest pain, unspecified: Secondary | ICD-10-CM

## 2013-08-06 DIAGNOSIS — I7090 Unspecified atherosclerosis: Secondary | ICD-10-CM | POA: Insufficient documentation

## 2013-08-06 DIAGNOSIS — K7689 Other specified diseases of liver: Secondary | ICD-10-CM | POA: Insufficient documentation

## 2013-08-06 MED ORDER — IOHEXOL 300 MG/ML  SOLN
100.0000 mL | Freq: Once | INTRAMUSCULAR | Status: AC | PRN
  Administered 2013-08-06: 100 mL via INTRAVENOUS

## 2013-08-06 NOTE — Progress Notes (Signed)
Quick Note:  Pancreas ok on CT scan He does have gallstones His sxs did not match gallstones that much but it could be possible they were the problem We can send him to a surgeon vs. Watching and waiting for more problems - what would he prefer - i am happy to see hiim in f/u in 6-8 weeks also just to recheck Note repeat lipase and amylase tests were normal ______

## 2013-09-29 ENCOUNTER — Encounter: Payer: Self-pay | Admitting: Internal Medicine

## 2013-09-29 ENCOUNTER — Ambulatory Visit (INDEPENDENT_AMBULATORY_CARE_PROVIDER_SITE_OTHER): Payer: Medicare Other | Admitting: Internal Medicine

## 2013-09-29 VITALS — BP 110/66 | HR 76 | Ht 70.75 in | Wt 238.0 lb

## 2013-09-29 DIAGNOSIS — K8020 Calculus of gallbladder without cholecystitis without obstruction: Secondary | ICD-10-CM

## 2013-09-29 NOTE — Assessment & Plan Note (Addendum)
He has cholelithiasis based upon his February CT scan. I'm not a percent certain this is symptomatic though it could be. He is interested in pursuing treatment so her for her to surgery for consideration of cholecystectomy. I explained that his symptoms are not typical of symptomatic cholelithiasis though there are always exceptions.  CC: [name], MD

## 2013-09-29 NOTE — Patient Instructions (Signed)
You have been scheduled for an appointment with Dr. Greer Pickerel at [facility]. Your appointment is on May 13th at 9:45AM. Please arrive at 9:30AM for registration. Make certain to bring a list of current medications, including any over the counter medications or vitamins. Also bring your co-pay if you have one as well as your insurance cards. [facility] is located at [address], [address]. Should you need to reschedule your appointment, please contact them at [phone].   I appreciate the opportunity to care for you.

## 2013-09-29 NOTE — Progress Notes (Signed)
         Subjective:    Patient ID: Ashten Prats, male    DOB: 05/05/1941, 73 y.o.   MRN: [id number]  HPI The patient is an elderly white man I saw with an elevated lipase in the setting of some chest pain. These were detected when he was in the emergency department. I ended up getting a CT scan which showed gallstones. His situation is a bit atypical medications that left-sided pain, it does seem to be postprandially he said he has had some problem since then. When we call the results I offered a surgical referral versus followup. He chose followup come speak to me though I think he thought I could perform cholecystectomy.  Medications, allergies, past medical history, past surgical history, family history and social history are reviewed and updated in the EMR.   Review of Systems As above    Objective:   Physical Exam Obese, elderly no acute distress   Assessment & Plan:  Cholelithiasis He has cholelithiasis based upon his February CT scan. I'm not a percent certain this is symptomatic though it could be. He is interested in pursuing treatment so her for her to surgery for consideration of cholecystectomy. I explained that his symptoms are not typical of symptomatic cholelithiasis though there are always exceptions.  CC: [name], MD

## 2013-10-22 ENCOUNTER — Encounter (INDEPENDENT_AMBULATORY_CARE_PROVIDER_SITE_OTHER): Payer: Self-pay | Admitting: General Surgery

## 2013-10-22 ENCOUNTER — Ambulatory Visit (INDEPENDENT_AMBULATORY_CARE_PROVIDER_SITE_OTHER): Payer: Medicare Other | Admitting: General Surgery

## 2013-10-22 VITALS — BP 130/81 | HR 82 | Temp 97.7°F | Resp 16 | Ht 71.0 in | Wt 236.2 lb

## 2013-10-22 DIAGNOSIS — K8020 Calculus of gallbladder without cholecystitis without obstruction: Secondary | ICD-10-CM | POA: Insufficient documentation

## 2013-10-22 NOTE — Patient Instructions (Signed)
Laparoscopic Cholecystectomy °Laparoscopic cholecystectomy is surgery to remove the gallbladder. The gallbladder is located in the upper right part of the abdomen, behind the liver. It is a storage sac for bile produced in the liver. Bile aids in the digestion and absorption of fats. Cholecystectomy is often done for inflammation of the gallbladder (cholecystitis). This condition is usually caused by a buildup of gallstones (cholelithiasis) in your gallbladder. Gallstones can block the flow of bile, resulting in inflammation and pain. In severe cases, emergency surgery may be required. When emergency surgery is not required, you will have time to prepare for the procedure. °Laparoscopic surgery is an alternative to open surgery. Laparoscopic surgery has a shorter recovery time. Your common bile duct may also need to be examined during the procedure. If stones are found in the common bile duct, they may be removed. °LET YOUR HEALTH CARE PROVIDER KNOW ABOUT: °· Any allergies you have. °· All medicines you are taking, including vitamins, herbs, eye drops, creams, and over-the-counter medicines. °· Previous problems you or members of your family have had with the use of anesthetics. °· Any blood disorders you have. °· Previous surgeries you have had. °· Medical conditions you have. °RISKS AND COMPLICATIONS °Generally, this is a safe procedure. However, as with any procedure, complications can occur. Possible complications include: °· Infection. °· Damage to the common bile duct, nerves, arteries, veins, or other internal organs such as the stomach, liver, or intestines. °· Bleeding. °· A stone may remain in the common bile duct. °· A bile leak from the cyst duct that is clipped when your gallbladder is removed. °· The need to convert to open surgery, which requires a larger incision in the abdomen. This may be necessary if your surgeon thinks it is not safe to continue with a laparoscopic procedure. °BEFORE THE  PROCEDURE °· Ask your health care provider about changing or stopping any regular medicines. You will need to stop taking aspirin or blood thinners at least 5 days prior to surgery. °· Do not eat or drink anything after midnight the night before surgery. °· Let your health care provider know if you develop a cold or other infectious problem before surgery. °PROCEDURE  °· You will be given medicine to make you sleep through the procedure (general anesthetic). A breathing tube will be placed in your mouth. °· When you are asleep, your surgeon will make several small cuts (incisions) in your abdomen. °· A thin, lighted tube with a tiny camera on the end (laparoscope) is inserted through one of the small incisions. The camera on the laparoscope sends a picture to a TV screen in the operating room. This gives the surgeon a good view inside your abdomen. °· A gas will be pumped into your abdomen. This expands your abdomen so that the surgeon has more room to perform the surgery. °· Other tools needed for the procedure are inserted through the other incisions. The gallbladder is removed through one of the incisions. °· After the removal of your gallbladder, the incisions will be closed with stitches, staples, or skin glue. °AFTER THE PROCEDURE °· You will be taken to a recovery area where your progress will be checked often. °· You may be allowed to go home the same day if your pain is controlled and you can tolerate liquids. °Document Released: 05/29/2005 Document Revised: 03/19/2013 Document Reviewed: 01/08/2013 °[employer]® Patient Information ©2014 [employer]. ° °

## 2013-10-24 ENCOUNTER — Encounter (HOSPITAL_COMMUNITY): Payer: Self-pay | Admitting: Pharmacist

## 2013-10-24 NOTE — Progress Notes (Signed)
Patient ID: Luis [name], male   DOB: February 01, 1941, 73 y.o.   MRN: [id number]  Chief Complaint  Patient presents with  . Abdominal Pain    gallstone    HPI Luis [name] is a 73 y.o. male.   HPI 73 year old Caucasian male referred by Dr. Carlean Purl for evaluation of abdominal pain. The patient has been having intermittent episodes of mainly epigastric and left upper quadrant pain and left lower chest wall pain for the past several months. He ended up going to the emergency room in February for evaluation because the pain was quite severe. His cardiac workup was negative. He had an elevated lipase. He was found to have gallstones on the CT scan otherwise his scan was negative. He denies any recent medication changes around that time. He drinks alcohol occasionally but only maybe once a week. He ended up seeing gastroenterology in February and in [place] denies any jaundice, weight loss, or acholic stools. He denies any frequent NSAID use. He denies any melena or hematochezia. The discomfort is mainly in his left upper abdomen and left lower chest wall. There is no particular pattern to it. Occasionally it may occur after meals. It generally lasts for a while when it comes on. He is convinced that his gallbladder is the source of his pain and discomfort Past Medical History  Diagnosis Date  . Hypertension   . Hyperlipidemia   . Sarcoidosis   . Cataract   . Skin cancer     left shoulder.and mid chest  . History of adenomatous polyp of colon 05/2005    8 mm adenoma  . Elevated lipase   . Diverticulosis 05/2005  . Gallstones     Past Surgical History  Procedure Laterality Date  . Tonsillectomy    . Lung surgery      partial removal right lung  . Colonoscopy w/ polypectomy  06/08/2005 and 10/18/2010    8 mm adenoma 2006, none 2012. Diverticulosis and internal hemorrhoids.  . Eye surgery Right     melanom  . Cataract extraction Bilateral   . Lymph node biopsy      mid chest  . Prostate  surgery      radiation seed implant  . Corneal transplant Bilateral   . Finger surgery Right     middle finger    Family History  Problem Relation Age of Onset  . Heart disease Father 70    Died from "hardening of the arteries" age 39  . Coronary artery disease Sister 9  . Colon cancer Neg Hx   . Throat cancer Neg Hx   . Pancreatic cancer Neg Hx   . Prostate cancer Neg Hx     Social History History  Substance Use Topics  . Smoking status: Current Some Day Smoker -- 0.50 packs/day for 45 years    Types: Cigars  . Smokeless tobacco: Never Used     Comment: Quit cigarettes 7 years ago but smokes cigars infrequently; form given 08-04-13  . Alcohol Use: 0.5 oz/week    1 drink(s) per week    No Known Allergies  Current Outpatient Prescriptions  Medication Sig Dispense Refill  . celecoxib (CELEBREX) 200 MG capsule Take 200 mg by mouth 2 (two) times daily.       . clotrimazole-betamethasone (LOTRISONE) cream Apply 1 application topically 2 (two) times daily as needed (rash).       . ezetimibe (ZETIA) 10 MG tablet Take 10 mg by mouth every evening.       [name]  glucosamine-chondroitin 500-400 MG tablet Take 1 tablet by mouth every evening.       [name] aspirin EC 81 MG tablet Take 81 mg by mouth every evening.      . Ergocalciferol (VITAMIN D2) 2000 UNITS TABS Take 2,000 Units by mouth every evening.      [name] losartan (COZAAR) 50 MG tablet Take 50 mg by mouth every morning.      . Multiple Vitamin (MULTIVITAMIN WITH MINERALS) TABS tablet Take 1 tablet by mouth every evening.      . pravastatin (PRAVACHOL) 40 MG tablet Take 40 mg by mouth every evening.      [name] RA KRILL OIL 500 MG CAPS Take 500 mg by mouth every evening.      . sildenafil (REVATIO) 20 MG tablet Take 60-100 mg by mouth daily as needed (for erectile dysfunction).       No current facility-administered medications for this visit.    Review of Systems Review of Systems  Constitutional: Negative for fever, chills, appetite  change and unexpected weight change.  HENT: Positive for hearing loss. Negative for congestion and trouble swallowing.   Eyes: Negative for visual disturbance.  Respiratory: Negative for chest tightness and shortness of breath.   Cardiovascular: Negative for chest pain and leg swelling.       No PND, no orthopnea, no DOE  Gastrointestinal:       See HPI  Genitourinary: Negative for dysuria and hematuria.  Musculoskeletal: Negative.   Skin: Negative for rash.  Neurological: Negative for seizures and speech difficulty.  Hematological: Does not bruise/bleed easily.  Psychiatric/Behavioral: Negative for behavioral problems and confusion.    Blood pressure 130/81, pulse 82, temperature 97.7 F (36.5 C), temperature source Temporal, resp. rate 16, height 5\' 11"  (1.803 m), weight 236 lb 3.2 oz (107.14 kg).  Physical Exam Physical Exam  Vitals reviewed. Constitutional: He is oriented to person, place, and time. He appears well-developed and well-nourished. No distress.  obese  HENT:  Head: Normocephalic and atraumatic.  Right Ear: External ear normal.  Left Ear: External ear normal.  Eyes: Conjunctivae are normal. No scleral icterus.  Neck: Normal range of motion. Neck supple. No tracheal deviation present. No thyromegaly present.  Cardiovascular: Normal rate, normal heart sounds and intact distal pulses.   Pulmonary/Chest: Effort normal and breath sounds normal. No respiratory distress. He has no wheezes.  Abdominal: Soft. He exhibits no distension. There is no tenderness. There is no rebound and no guarding.  Protuberant abdomen; small fascial defect at umbilicus; upper midline diastasis recti  Musculoskeletal: Normal range of motion. He exhibits no edema and no tenderness.  Lymphadenopathy:    He has no cervical adenopathy.  Neurological: He is alert and oriented to person, place, and time. He exhibits normal muscle tone.  Skin: Skin is warm and dry. No rash noted. He is not  diaphoretic. No erythema. No pallor.  Psychiatric: He has a normal mood and affect. His behavior is normal. Judgment and thought content normal.    Data Reviewed Dr Celesta Aver office notes ED note 08/03/13 and labs - lipase 125 nml cardiac stress study 01/2012 Ct abd /pelvis - cholelithiasis only   Assessment    Left lower chest wall-upper abdominal pain Cholelithiasis     Plan    I had a very long conversation with the patient. His symptoms are definitely not classic for gallbladder disease. However I did explain that sometimes some patients present with atypical gallbladder symptoms. I explained that in this situation with  these types of atypical symptoms cholecystectomy has mixed results. I explained that in some patients the pain is resolved, and others there is a minor relief, and in others no change in their symptoms.  I offered him a cholecystectomy with the understanding that it may not ameliorate his pain  We discussed gallbladder disease. The patient was given Neurosurgeon. We discussed non-operative and operative management. We discussed the signs & symptoms of acute cholecystitis  I discussed laparoscopic cholecystectomy with IOC in detail.  The patient was given educational material as well as diagrams detailing the procedure.  We discussed the risks and benefits of a laparoscopic cholecystectomy including, but not limited to bleeding, infection, injury to surrounding structures such as the intestine or liver, bile leak, retained gallstones, need to convert to an open procedure, prolonged diarrhea, blood clots such as  DVT, common bile duct injury, anesthesia risks, and possible need for additional procedures.  We discussed the typical post-operative recovery course. I explained that the likelihood of improvement of their symptoms is mixed.  He has elected to proceed with surgery and will meet with the schedulers to schedule surgery  Leighton Ruff. Redmond Pulling, MD, FACS General,  Bariatric, & Minimally Invasive Surgery [facility] [place]         Gayland Curry 10/24/2013, 4:34 PM

## 2013-10-27 NOTE — Pre-Procedure Instructions (Signed)
Luis [name]  10/27/2013   Your procedure is scheduled on:  Friday, May 22nd  Report to [facility] Admitting at 0730 AM.  Call this number if you have problems the morning of surgery: [phone]   Remember:   Do not eat food or drink liquids after midnight.   Take these medicines the morning of surgery with A SIP OF WATER: none   Do not wear jewelry,  Do not wear lotions, powders, or perfumes. You may wear deodorant.  Do not shave 48 hours prior to surgery. Men may shave face and neck.  Do not bring valuables to the hospital.  [facility] is not responsible  for any belongings or valuables.               Contacts, dentures or bridgework may not be worn into surgery.  Leave suitcase in the car. After surgery it may be brought to your room.  For patients admitted to the hospital, discharge time is determined by your   treatment team.               Patients discharged the day of surgery will not be allowed to drive home.  Please read over the following fact sheets that you were given: Pain Booklet, Coughing and Deep Breathing and Surgical Site Infection Prevention [place] - Preparing for Surgery  Before surgery, you can play an important role.  Because skin is not sterile, your skin needs to be as free of germs as possible.  You can reduce the number of germs on you skin by washing with CHG (chlorahexidine gluconate) soap before surgery.  CHG is an antiseptic cleaner which kills germs and bonds with the skin to continue killing germs even after washing.  Please DO NOT use if you have an allergy to CHG or antibacterial soaps.  If your skin becomes reddened/irritated stop using the CHG and inform your nurse when you arrive at Short Stay.  Do not shave (including legs and underarms) for at least 48 hours prior to the first CHG shower.  You may shave your face.  Please follow these instructions carefully:   1.  Shower with CHG Soap the night before surgery and the morning  of Surgery.  2.  If you choose to wash your hair, wash your hair first as usual with your normal shampoo.  3.  After you shampoo, rinse your hair and body thoroughly to remove the shampoo.  4.  Use CHG as you would any other liquid soap.  You can apply CHG directly to the skin and wash gently with scrungie or a clean washcloth.  5.  Apply the CHG Soap to your body ONLY FROM THE NECK DOWN.  Do not use on open wounds or open sores.  Avoid contact with your eyes, ears, mouth and genitals (private parts).  Wash genitals (private parts) with your normal soap.  6.  Wash thoroughly, paying special attention to the area where your surgery will be performed.  7.  Thoroughly rinse your body with warm water from the neck down.  8.  DO NOT shower/wash with your normal soap after using and rinsing off the CHG Soap.  9.  Pat yourself dry with a clean towel.            10.  Wear clean pajamas.            11.  Place clean sheets on your bed the night of your first shower and do not sleep  with pets.  Day of Surgery  Do not apply any lotions/deoderants the morning of surgery.  Please wear clean clothes to the hospital/surgery center.

## 2013-10-28 ENCOUNTER — Inpatient Hospital Stay (HOSPITAL_COMMUNITY)
Admission: RE | Admit: 2013-10-28 | Discharge: 2013-10-28 | Disposition: A | Discharge status: Home / Self Care | Payer: Medicare Other | Source: Ambulatory Visit

## 2013-10-29 ENCOUNTER — Telehealth (INDEPENDENT_AMBULATORY_CARE_PROVIDER_SITE_OTHER): Payer: Self-pay | Admitting: General Surgery

## 2013-10-29 NOTE — Telephone Encounter (Signed)
changed surgery date and location from 5/22 to 6/12 from [facility] to [facility] per pt needed another date that would work for his wife

## 2013-10-29 NOTE — Telephone Encounter (Signed)
Remind me to fill out orders for [facility] on Thursday please

## 2013-10-31 ENCOUNTER — Ambulatory Visit (HOSPITAL_COMMUNITY): Admission: RE | Admit: 2013-10-31 | Payer: Medicare Other | Source: Ambulatory Visit | Admitting: General Surgery

## 2013-10-31 ENCOUNTER — Encounter (HOSPITAL_COMMUNITY): Admission: RE | Payer: Self-pay | Source: Ambulatory Visit

## 2013-10-31 SURGERY — LAPAROSCOPIC CHOLECYSTECTOMY WITH INTRAOPERATIVE CHOLANGIOGRAM
Anesthesia: General

## 2013-11-21 ENCOUNTER — Other Ambulatory Visit (INDEPENDENT_AMBULATORY_CARE_PROVIDER_SITE_OTHER): Payer: Self-pay | Admitting: General Surgery

## 2013-11-21 DIAGNOSIS — K8010 Calculus of gallbladder with chronic cholecystitis without obstruction: Secondary | ICD-10-CM

## 2013-11-24 ENCOUNTER — Other Ambulatory Visit (INDEPENDENT_AMBULATORY_CARE_PROVIDER_SITE_OTHER): Payer: Self-pay

## 2013-11-24 MED ORDER — OXYCODONE-ACETAMINOPHEN 5-325 MG PO TABS: 1.0000 | ORAL_TABLET | ORAL | Status: DC | PRN

## 2013-12-25 ENCOUNTER — Encounter (INDEPENDENT_AMBULATORY_CARE_PROVIDER_SITE_OTHER): Payer: Self-pay | Admitting: General Surgery

## 2013-12-25 ENCOUNTER — Ambulatory Visit (INDEPENDENT_AMBULATORY_CARE_PROVIDER_SITE_OTHER): Payer: Medicare Other | Admitting: General Surgery

## 2013-12-25 VITALS — BP 130/76 | HR 77 | Temp 97.5°F | Ht 71.0 in | Wt 237.0 lb

## 2013-12-25 DIAGNOSIS — Z09 Encounter for follow-up examination after completed treatment for conditions other than malignant neoplasm: Secondary | ICD-10-CM

## 2013-12-25 NOTE — Patient Instructions (Signed)
Can resume full activities

## 2013-12-25 NOTE — Progress Notes (Signed)
Subjective:     Patient ID: Luis [name], male   DOB: 10-21-1940, 73 y.o.   MRN: [id number]  HPI 73 year old male comes in for postop check after undergoing laparoscopic cholecystectomy with cholangiogram last month. He states he has had no other symptoms he had preoperatively postoperatively. He has had no epigastric or chest wall pain. He denies any nausea, vomiting, diarrhea or constipation. He denies any problems with his incisions.  Review of Systems     Objective:   Physical Exam BP 130/76  Pulse 77  Temp(Src) 97.5 F (36.4 C)  Ht 5\' 11"  (1.803 m)  Wt 237 lb (107.502 kg)  BMI 33.07 kg/m2  Gen: alert, NAD, non-toxic appearing Pupils: equal, no scleral icterus Pulm: Lungs clear to auscultation, symmetric chest rise CV: regular rate and rhythm Abd: soft, nontender, nondistended. Well-healed trocar sites. No cellulitis. No incisional hernia. Protuberant abdomen Skin: no rash, no jaundice     Assessment:     Status post laparoscopic cholecystectomy with cholangiogram     Plan:     We discussed his pathology and he was given a copy of his pathology report which showed chronic cholecystitis and cholelithiasis. I have released him to full activities. All of his questions were asked and answered. Followup as needed  Leighton Ruff. Redmond Pulling, MD, FACS General, Bariatric, & Minimally Invasive Surgery [facility], [place]

## 2014-08-10 ENCOUNTER — Encounter: Payer: Self-pay | Admitting: Nurse Practitioner

## 2014-08-10 ENCOUNTER — Ambulatory Visit (INDEPENDENT_AMBULATORY_CARE_PROVIDER_SITE_OTHER): Payer: Medicare Other | Admitting: Nurse Practitioner

## 2014-08-10 ENCOUNTER — Ambulatory Visit
Admission: RE | Admit: 2014-08-10 | Discharge: 2014-08-10 | Disposition: A | Discharge status: Home / Self Care | Payer: Medicare Other | Source: Ambulatory Visit | Attending: Nurse Practitioner | Admitting: Nurse Practitioner

## 2014-08-10 ENCOUNTER — Other Ambulatory Visit: Payer: Self-pay | Admitting: Nurse Practitioner

## 2014-08-10 VITALS — BP 120/62 | HR 76 | Ht 72.0 in | Wt 242.0 lb

## 2014-08-10 DIAGNOSIS — R079 Chest pain, unspecified: Secondary | ICD-10-CM

## 2014-08-10 DIAGNOSIS — R0600 Dyspnea, unspecified: Secondary | ICD-10-CM

## 2014-08-10 DIAGNOSIS — I200 Unstable angina: Secondary | ICD-10-CM

## 2014-08-10 LAB — CBC
HCT: 43.7 % (ref 39.0–52.0)
Hemoglobin: 15.1 g/dL (ref 13.0–17.0)
MCHC: 34.5 g/dL (ref 30.0–36.0)
MCV: 93.2 fl (ref 78.0–100.0)
Platelets: 139 10*3/uL — ABNORMAL LOW (ref 150.0–400.0)
RBC: 4.69 Mil/uL (ref 4.22–5.81)
RDW: 14.5 % (ref 11.5–15.5)
WBC: 7.8 10*3/uL (ref 4.0–10.5)

## 2014-08-10 LAB — BASIC METABOLIC PANEL
BUN: 20 mg/dL (ref 6–23)
CO2: 30 mEq/L (ref 19–32)
Calcium: 9.5 mg/dL (ref 8.4–10.5)
Chloride: 102 mEq/L (ref 96–112)
Creatinine, Ser: 0.94 mg/dL (ref 0.40–1.50)
GFR: 83.53 mL/min (ref >60.00)
Glucose, Bld: 111 mg/dL — ABNORMAL HIGH (ref 70–99)
Potassium: 4 mEq/L (ref 3.5–5.1)
Sodium: 137 mEq/L (ref 135–145)

## 2014-08-10 LAB — PROTIME-INR
INR: 1 ratio (ref 0.8–1.0)
Prothrombin Time: 10.6 s (ref 9.6–13.1)

## 2014-08-10 LAB — APTT: aPTT: 30.2 s (ref 23.4–32.7)

## 2014-08-10 LAB — BRAIN NATRIURETIC PEPTIDE: Pro B Natriuretic peptide (BNP): 60 pg/mL (ref 0.0–100.0)

## 2014-08-10 MED ORDER — NITROGLYCERIN 0.4 MG SL SUBL
0.4000 mg | SUBLINGUAL_TABLET | SUBLINGUAL | Status: DC | PRN
Start: 2014-08-10 — End: 2015-02-25

## 2014-08-10 MED ORDER — DIAZEPAM 2 MG PO TABS: 5.0000 mg | ORAL_TABLET | ORAL | Status: AC

## 2014-08-10 NOTE — Progress Notes (Addendum)
CARDIOLOGY OFFICE NOTE  Date:  08/10/2014    Luis [name] Date of Birth: December 24, 1940 Medical Record #[id number]  PCP:  Luis Sake, MD  Cardiologist:  Luis [name]  Chief Complaint  Patient presents with  . Chest Pain    Work in visit - seen for Dr. Percival [name]    History of Present Illness: Luis [name] is a 74 y.o. male who presents today for a work in visit. Seen for Dr. Percival [name]. He has no known CAD. Does have a history of HTN, HLD, prostate cancer and fibrosis due to sarcoid (remotely seen by Dr. Joya [name]).   Last seen in October of 2013 - that was for syncope - negative event monitor and negative Myoview at that time. Has not been seen since.  Comes in today. Referred by his PCP. Continues to smoke - rare cigar. He notes that he has basically done well since his last visit here - until about 3 to 4 months ago. He has started having a "burning" sensation across his chest - associated with SOB - sometimes with radiation down left arm - this is always with exertion. He has had spells with playing racquet ball. Now having with more of just daily activities. No spells at rest. Not using Viagra. Spells are getting worse but certainly not any better. He is anxious to proceed with "whatever is necessary".    Past Medical History  Diagnosis Date  . Hypertension   . Hyperlipidemia   . Sarcoidosis   . Cataract   . Skin cancer     left shoulder.and mid chest  . History of adenomatous polyp of colon 05/2005    8 mm adenoma  . Elevated lipase   . Diverticulosis 05/2005  . Gallstones     Past Surgical History  Procedure Laterality Date  . Tonsillectomy    . Lung surgery      partial removal right lung  . Colonoscopy w/ polypectomy  06/08/2005 and 10/18/2010    8 mm adenoma 2006, none 2012. Diverticulosis and internal hemorrhoids.  . Eye surgery Right     melanom  . Cataract extraction Bilateral   . Lymph node biopsy      mid chest  . Prostate surgery      radiation seed  implant  . Corneal transplant Bilateral   . Finger surgery Right     middle finger     Medications: Current Outpatient Prescriptions  Medication Sig Dispense Refill  . aspirin EC 81 MG tablet Take 81 mg by mouth every evening.    . celecoxib (CELEBREX) 200 MG capsule Take 200 mg by mouth 2 (two) times daily.     . clotrimazole-betamethasone (LOTRISONE) cream Apply 1 application topically 2 (two) times daily as needed (rash).     . Ergocalciferol (VITAMIN D2) 2000 UNITS TABS Take 2,000 Units by mouth every evening.    . ezetimibe (ZETIA) 10 MG tablet Take 10 mg by mouth every evening.     [name] glucosamine-chondroitin 500-400 MG tablet Take 1 tablet by mouth every evening.     [name] losartan (COZAAR) 50 MG tablet Take 50 mg by mouth every morning.    . Multiple Vitamin (MULTIVITAMIN WITH MINERALS) TABS tablet Take 1 tablet by mouth every evening.    . pravastatin (PRAVACHOL) 40 MG tablet Take 40 mg by mouth every evening.    [name] RA KRILL OIL 500 MG CAPS Take 500 mg by mouth every evening.    . sildenafil (REVATIO) 20 MG tablet Take  60-100 mg by mouth daily as needed (for erectile dysfunction).    . nitroGLYCERIN (NITROSTAT) 0.4 MG SL tablet Place 1 tablet (0.4 mg total) under the tongue every 5 (five) minutes as needed for chest pain. 25 tablet 3   No current facility-administered medications for this visit.    Allergies: No Known Allergies  Social History: The patient  reports that he has been smoking Cigars.  He has never used smokeless tobacco. He reports that he drinks about 0.5 oz of alcohol per week. He reports that he does not use illicit drugs.   Family History: The patient's family history includes Coronary artery disease (age of onset: 61) in his sister; Heart disease (age of onset: 1) in his father. There is no history of Colon cancer, Throat cancer, Pancreatic cancer, or Prostate cancer.   Review of Systems: Please see the history of present illness.   Otherwise, the review of  systems is positive for chest pain and DOE.   All other systems are reviewed and negative.   Physical Exam: VS:  BP 120/62 mmHg  Pulse 76  Ht 6' (1.829 m)  Wt 242 lb (109.77 kg)  BMI 32.81 kg/m2 .  BMI Body mass index is 32.81 kg/(m^2).   Oxygen level is 94% on RA.   Wt Readings from Last 3 Encounters:  08/10/14 242 lb (109.77 kg)  12/25/13 237 lb (107.502 kg)  10/22/13 236 lb 3.2 oz (107.14 kg)    General: Pleasant. Well developed, well nourished and in no acute distress.  HEENT: Normal. Neck: Supple, no JVD, carotid bruits, or masses noted.  Cardiac: Regular rate and rhythm. No murmurs, rubs, or gallops. No edema.  Respiratory:  Lungs are clear to auscultation bilaterally with normal work of breathing.  GI: Soft and nontender.  MS: No deformity or atrophy. Gait and ROM intact. Skin: Warm and dry. Color is normal.  Neuro:  Strength and sensation are intact and no gross focal deficits noted.  Psych: Alert, appropriate and with normal affect.   LABORATORY DATA:  EKG:  EKG is ordered today. This demonstrates NSR with 1st degree AV block and RBBB. Unchanged from prior tracing.   Lab Results  Component Value Date   WBC 5.9 08/03/2013   HGB 14.8 08/03/2013   HCT 41.5 08/03/2013   PLT 131* 08/03/2013   GLUCOSE 94 08/03/2013   ALT 31 08/03/2013   AST 34 08/03/2013   NA 139 08/03/2013   K 4.3 08/03/2013   CL 101 08/03/2013   CREATININE 0.88 08/03/2013   BUN 15 08/03/2013   CO2 27 08/03/2013   INR 0.97 08/23/2009    BNP (last 3 results) No results for input(s): BNP in the last 8760 hours.  ProBNP (last 3 results) No results for input(s): PROBNP in the last 8760 hours.   Other Studies Reviewed Today:  Myoview Impression from 01/2012 Exercise Capacity: Poor exercise capacity. BP Response: Normal blood pressure response. Clinical Symptoms: There is dyspnea. ECG Impression: No significant ST segment change suggestive of ischemia. Comparison with Prior Nuclear  Study: No images to compare  Overall Impression: Normal stress nuclear study. Baseline ECG with RBBB  LV Ejection Fraction: 62%. LV Wall Motion: NL LV Function; NL Wall Motion  [place]    Assessment/Plan: 1. Chest pain - exertional symptoms - sounds like unstable angina that is progressive. He needs cardiac cath. The patient understands that risks include but are not limited to stroke (1 in 1000), death (1 in 41), kidney failure [usually temporary] (  1 in 500), bleeding (1 in 200), allergic reaction [possibly serious] (1 in 200), and agrees to proceed. NTG sent in and he is instructed in how to take. He is to limit his activities in the interim.   2. HTN -  BP ok on current regimen.   3. HLD -  On statin  4. Ongoing tobacco abuse -  Smoking rare cigar.   5. Sarcoid  6. Fibrosis -  His sats do drop to 88% with walking from POD I to the lab. May need to refer back to pulmonary but will see what cath shows first.   Current medicines are reviewed with the patient today.  The patient does not have concerns regarding medicines other than what has been noted above.  The following changes have been made:  See above.  Labs/ tests ordered today include:    Orders Placed This Encounter  Procedures  . DG Chest 2 View  . Brain natriuretic peptide  . Basic metabolic panel  . CBC  . Protime-INR  . APTT  . EKG 12-Lead     Disposition:  Further disposition to follow.   Patient is agreeable to this plan and will call if any problems develop in the interim.   Signed: Burtis Junes, RN, ANP-C 08/10/2014 10:50 AM  [facility] [address] [place], [place]  [address] Phone: [phone] Fax: [phone]

## 2014-08-10 NOTE — Patient Instructions (Signed)
We will be checking the following labs today BNP, BMET, CBC, PT, PTT  Please go to [facility] to [place] on the first floor for a chest Xray - you may walk in.   Stay on your current medicines but I have sent in a RX for NTG  Use your NTG under your tongue for recurrent chest pain. May take one tablet every 5 minutes. If you are still having discomfort after 3 tablets in 15 minutes, call 911.  Your provider has recommended a cardiac catherization  You are scheduled for a cardiac catheterization on Wednesday, March 2nd at 2:30pm with Dr. Burt Knack or associate.  Go to [facility] [place] on Wednesday, March 2nd at 12:30 pm   Enter thru the [employer] entrance A No food or drink after 6am on Wednesday - clear liquids prior to 6 am is ok You may take your medications with a sip of water on the day of your procedure.   Coronary Angiogram A coronary angiogram, also called coronary angiography, is an X-ray procedure used to look at the arteries in the heart. In this procedure, a dye (contrast dye) is injected through a long, hollow tube (catheter). The catheter is about the size of a piece of cooked spaghetti and is inserted through your groin, wrist, or arm. The dye is injected into each artery, and X-rays are then taken to show if there is a blockage in the arteries of your heart.  LET [facility] CARE PROVIDER KNOW ABOUT:  Any allergies you have, including allergies to shellfish or contrast dye.   All medicines you are taking, including vitamins, herbs, eye drops, creams, and over-the-counter medicines.   Previous problems you or members of your family have had with the use of anesthetics.   Any blood disorders you have.   Previous surgeries you have had.  History of kidney problems or failure.   Other medical conditions you have.  RISKS AND COMPLICATIONS  Generally, a coronary angiogram is a safe procedure. However, about 1 person out of 1000 can  have problems that may include:  Allergic reaction to the dye.  Bleeding/bruising from the access site or other locations.  Kidney injury, especially in people with impaired kidney function.  Stroke (rare).  Heart attack (rare).  Irregular rhythms (rare)  Death (rare)  BEFORE THE PROCEDURE   Do not eat or drink anything after midnight the night before the procedure or as directed by your health care provider.   Ask your health care provider about changing or stopping your regular medicines. This is especially important if you are taking diabetes medicines or blood thinners.  PROCEDURE  You may be given a medicine to help you relax (sedative) before the procedure. This medicine is given through an intravenous (IV) access tube that is inserted into one of your veins.   The area where the catheter will be inserted will be washed and shaved. This is usually done in the groin but may be done in the fold of your arm (near your elbow) or in the wrist.   A medicine will be given to numb the area where the catheter will be inserted (local anesthetic).   The health care provider will insert the catheter into an artery. The catheter will be guided by using a special type of X-ray (fluoroscopy) of the blood vessel being examined.   A special dye will then be injected into the catheter, and X-rays will be taken. The dye will help  to show where any narrowing or blockages are located in the heart arteries.    AFTER THE PROCEDURE   If the procedure is done through the leg, you will be kept in bed lying flat for several hours. You will be instructed to not bend or cross your legs.  The insertion site will be checked frequently.   The pulse in your feet or wrist will be checked frequently.   Additional blood tests, X-rays, and an electrocardiogram may be done.     We will be arranging for a   Call the [place] office at [phone] if you have  any questions, problems or concerns.

## 2014-08-11 ENCOUNTER — Telehealth: Payer: Self-pay | Admitting: Cardiovascular Disease

## 2014-08-11 NOTE — Telephone Encounter (Signed)
New message    Patient calling wanted to know can he still have his procedure - due to  cold.

## 2014-08-11 NOTE — Telephone Encounter (Signed)
I spoke with the pt and he has developed a head cold, no fever at this time. The pt is scheduled for cardiac cath tomorrow.  The pt is currently taking Theraflu OTC and this contains a decongestant. I advised the pt to stop this medication and use Coricidin HBP as this is okay from a heart stand point.  Pt agreed with plan and at this time with no fever he can proceed with cath.

## 2014-08-12 ENCOUNTER — Ambulatory Visit (HOSPITAL_COMMUNITY)
Admission: RE | Admit: 2014-08-12 | Discharge: 2014-08-13 | Disposition: A | Discharge status: Home / Self Care | Payer: Medicare Other | Source: Ambulatory Visit | Attending: Cardiovascular Disease | Admitting: Cardiovascular Disease

## 2014-08-12 ENCOUNTER — Encounter (HOSPITAL_COMMUNITY)
Admission: RE | Disposition: A | Discharge status: Home / Self Care | Payer: Self-pay | Source: Ambulatory Visit | Attending: Cardiovascular Disease

## 2014-08-12 ENCOUNTER — Encounter (HOSPITAL_COMMUNITY): Payer: Self-pay | Admitting: Cardiovascular Disease

## 2014-08-12 DIAGNOSIS — D869 Sarcoidosis, unspecified: Secondary | ICD-10-CM | POA: Diagnosis not present

## 2014-08-12 DIAGNOSIS — I2582 Chronic total occlusion of coronary artery: Secondary | ICD-10-CM | POA: Diagnosis not present

## 2014-08-12 DIAGNOSIS — E785 Hyperlipidemia, unspecified: Secondary | ICD-10-CM | POA: Diagnosis present

## 2014-08-12 DIAGNOSIS — Z79899 Other long term (current) drug therapy: Secondary | ICD-10-CM | POA: Insufficient documentation

## 2014-08-12 DIAGNOSIS — I25118 Atherosclerotic heart disease of native coronary artery with other forms of angina pectoris: Secondary | ICD-10-CM

## 2014-08-12 DIAGNOSIS — I25110 Atherosclerotic heart disease of native coronary artery with unstable angina pectoris: Secondary | ICD-10-CM | POA: Insufficient documentation

## 2014-08-12 DIAGNOSIS — K5790 Diverticulosis of intestine, part unspecified, without perforation or abscess without bleeding: Secondary | ICD-10-CM | POA: Insufficient documentation

## 2014-08-12 DIAGNOSIS — I209 Angina pectoris, unspecified: Secondary | ICD-10-CM | POA: Diagnosis present

## 2014-08-12 DIAGNOSIS — Z85828 Personal history of other malignant neoplasm of skin: Secondary | ICD-10-CM | POA: Insufficient documentation

## 2014-08-12 DIAGNOSIS — F17290 Nicotine dependence, other tobacco product, uncomplicated: Secondary | ICD-10-CM | POA: Diagnosis not present

## 2014-08-12 DIAGNOSIS — Z8546 Personal history of malignant neoplasm of prostate: Secondary | ICD-10-CM | POA: Diagnosis not present

## 2014-08-12 DIAGNOSIS — I2510 Atherosclerotic heart disease of native coronary artery without angina pectoris: Secondary | ICD-10-CM

## 2014-08-12 DIAGNOSIS — I10 Essential (primary) hypertension: Secondary | ICD-10-CM

## 2014-08-12 DIAGNOSIS — Z955 Presence of coronary angioplasty implant and graft: Secondary | ICD-10-CM

## 2014-08-12 DIAGNOSIS — Z7982 Long term (current) use of aspirin: Secondary | ICD-10-CM | POA: Diagnosis not present

## 2014-08-12 DIAGNOSIS — I200 Unstable angina: Secondary | ICD-10-CM

## 2014-08-12 HISTORY — DX: Atherosclerotic heart disease of native coronary artery without angina pectoris: I25.10

## 2014-08-12 HISTORY — PX: LEFT HEART CATHETERIZATION WITH CORONARY ANGIOGRAM: SHX5451

## 2014-08-12 LAB — POCT ACTIVATED CLOTTING TIME: ACTIVATED CLOTTING TIME: 479 s

## 2014-08-12 SURGERY — LEFT HEART CATHETERIZATION WITH CORONARY ANGIOGRAM

## 2014-08-12 MED ORDER — ASPIRIN 81 MG PO CHEW
CHEWABLE_TABLET | ORAL | Status: AC
  Filled 2014-08-12: qty 1

## 2014-08-12 MED ORDER — ACETAMINOPHEN 325 MG PO TABS: 650.0000 mg | ORAL_TABLET | ORAL | Status: DC | PRN

## 2014-08-12 MED ORDER — SODIUM CHLORIDE 0.9 % IJ SOLN: 3.0000 mL | INTRAMUSCULAR | Status: DC | PRN

## 2014-08-12 MED ORDER — EZETIMIBE 10 MG PO TABS
10.0000 mg | ORAL_TABLET | Freq: Every evening | ORAL | Status: DC
  Administered 2014-08-12: 10 mg via ORAL
  Filled 2014-08-12 (×2): qty 1

## 2014-08-12 MED ORDER — CLOPIDOGREL BISULFATE 300 MG PO TABS
ORAL_TABLET | ORAL | Status: AC
  Filled 2014-08-12: qty 1

## 2014-08-12 MED ORDER — CLOPIDOGREL BISULFATE 75 MG PO TABS
75.0000 mg | ORAL_TABLET | Freq: Every day | ORAL | Status: DC
  Administered 2014-08-13: 08:00:00 75 mg via ORAL
  Filled 2014-08-12: qty 1

## 2014-08-12 MED ORDER — MIDAZOLAM HCL 2 MG/2ML IJ SOLN
INTRAMUSCULAR | Status: AC
  Filled 2014-08-12: qty 2

## 2014-08-12 MED ORDER — SODIUM CHLORIDE 0.9 % IJ SOLN: 3.0000 mL | Freq: Two times a day (BID) | INTRAMUSCULAR | Status: DC

## 2014-08-12 MED ORDER — VERAPAMIL HCL 2.5 MG/ML IV SOLN
INTRAVENOUS | Status: AC
  Filled 2014-08-12: qty 2

## 2014-08-12 MED ORDER — ADULT MULTIVITAMIN W/MINERALS CH
1.0000 | ORAL_TABLET | Freq: Every evening | ORAL | Status: DC
  Filled 2014-08-12 (×2): qty 1

## 2014-08-12 MED ORDER — ASPIRIN EC 81 MG PO TBEC: 81.0000 mg | DELAYED_RELEASE_TABLET | Freq: Every evening | ORAL | Status: DC

## 2014-08-12 MED ORDER — SODIUM CHLORIDE 0.9 % IV SOLN
0.2500 mg/kg/h | INTRAVENOUS | Status: DC
  Filled 2014-08-12 (×5): qty 250

## 2014-08-12 MED ORDER — FENTANYL CITRATE 0.05 MG/ML IJ SOLN
INTRAMUSCULAR | Status: AC
  Filled 2014-08-12: qty 2

## 2014-08-12 MED ORDER — PRAVASTATIN SODIUM 40 MG PO TABS
40.0000 mg | ORAL_TABLET | Freq: Every evening | ORAL | Status: DC
  Administered 2014-08-12: 40 mg via ORAL
  Filled 2014-08-12 (×4): qty 1

## 2014-08-12 MED ORDER — GUAIFENESIN-DM 100-10 MG/5ML PO SYRP
5.0000 mL | ORAL_SOLUTION | ORAL | Status: DC | PRN
  Administered 2014-08-12 – 2014-08-13 (×2): 5 mL via ORAL
  Filled 2014-08-12 (×2): qty 5

## 2014-08-12 MED ORDER — NITROGLYCERIN 1 MG/10 ML FOR IR/CATH LAB
INTRA_ARTERIAL | Status: AC
  Filled 2014-08-12: qty 10

## 2014-08-12 MED ORDER — SODIUM CHLORIDE 0.9 % IV SOLN
1.0000 mL/kg/h | INTRAVENOUS | Status: AC
Start: 2014-08-12 — End: 2014-08-13

## 2014-08-12 MED ORDER — SODIUM CHLORIDE 0.9 % IV SOLN: 250.0000 mL | INTRAVENOUS | Status: DC | PRN

## 2014-08-12 MED ORDER — MENTHOL 3 MG MT LOZG
1.0000 | LOZENGE | OROMUCOSAL | Status: DC | PRN
  Administered 2014-08-12: 3 mg via ORAL
  Filled 2014-08-12 (×2): qty 9

## 2014-08-12 MED ORDER — METOPROLOL TARTRATE 12.5 MG HALF TABLET
12.5000 mg | ORAL_TABLET | Freq: Two times a day (BID) | ORAL | Status: DC
  Administered 2014-08-12 – 2014-08-13 (×2): 12.5 mg via ORAL
  Filled 2014-08-12 (×2): qty 1

## 2014-08-12 MED ORDER — SODIUM CHLORIDE 0.9 % IV SOLN
INTRAVENOUS | Status: DC
  Administered 2014-08-12: 12:00:00 via INTRAVENOUS

## 2014-08-12 MED ORDER — OXYCODONE-ACETAMINOPHEN 5-325 MG PO TABS: 1.0000 | ORAL_TABLET | ORAL | Status: DC | PRN

## 2014-08-12 MED ORDER — LOSARTAN POTASSIUM 25 MG PO TABS
25.0000 mg | ORAL_TABLET | Freq: Every morning | ORAL | Status: DC
  Administered 2014-08-13: 25 mg via ORAL
  Filled 2014-08-12: qty 1

## 2014-08-12 MED ORDER — ONDANSETRON HCL 4 MG/2ML IJ SOLN: 4.0000 mg | Freq: Four times a day (QID) | INTRAMUSCULAR | Status: DC | PRN

## 2014-08-12 MED ORDER — HEPARIN SODIUM (PORCINE) 1000 UNIT/ML IJ SOLN
INTRAMUSCULAR | Status: AC
  Filled 2014-08-12: qty 1

## 2014-08-12 MED ORDER — ASPIRIN 81 MG PO CHEW
81.0000 mg | CHEWABLE_TABLET | ORAL | Status: AC
  Administered 2014-08-12: 81 mg via ORAL

## 2014-08-12 MED ORDER — HEPARIN (PORCINE) IN NACL 2-0.9 UNIT/ML-% IJ SOLN
INTRAMUSCULAR | Status: AC
  Filled 2014-08-12: qty 1000

## 2014-08-12 MED ORDER — BIVALIRUDIN 250 MG IV SOLR
INTRAVENOUS | Status: AC
  Filled 2014-08-12: qty 250

## 2014-08-12 MED ORDER — LIDOCAINE HCL (PF) 1 % IJ SOLN
INTRAMUSCULAR | Status: AC
  Filled 2014-08-12: qty 30

## 2014-08-12 NOTE — Progress Notes (Signed)
UR Completed Aquilla Voiles Graves-Bigelow, RN,BSN [phone]  

## 2014-08-12 NOTE — CV Procedure (Signed)
Cardiac Catheterization Procedure Note  Name: Luis [name] MRN: [id number] DOB: 04-29-1941  Procedure: Left Heart Cath, Selective Coronary Angiography, LV angiography, PTCA and stenting of the first diagonal  Indication: Exertional chest pain, CCS Class 3 angina  Procedural Details:  The right wrist was prepped, draped, and anesthetized with 1% lidocaine. Using the modified Seldinger technique, a 5/6 French Slender sheath was introduced into the right radial artery. 3 mg of verapamil was administered through the sheath, weight-based unfractionated heparin was administered intravenously. Standard Judkins catheters were used for selective coronary angiography and left ventriculography. Catheter exchanges were performed over an exchange length guidewire.  PROCEDURAL FINDINGS Hemodynamics: AO 132/59 LV 140/17   Coronary angiography: Coronary dominance: right  Left mainstem: The left main is patent. There is no obstructive disease. The vessel divides into the LAD, intermediate branch, and left circumflex.  Left anterior descending (LAD): The LAD is patent to the apex. The proximal LAD has diffuse irregularities. At the first septal perforator there is 30% stenosis. The mid and distal LAD are widely patent and the vessel wraps around the LV apex. The first diagonal is medium in caliber with a 95% focal stenosis.  Left circumflex (LCx): The left circumflex courses down the AV groove and is small in caliber. There is an intermediate branch that is totally occluded proximally just after the left main divides. This is a large branch with 2 major subbranches that fill from left to left collaterals and retrograde fill back to the proximal vessel.  Right coronary artery (RCA): This is a dominant vessel. The vessel is smooth throughout its course. The PDA, PLA, and acute marginal branches are all patent.  Left ventriculography: There is mild hypokinesis of the mid inferior wall. The LVEF is  60%.  PCI Note:  Following the diagnostic procedure, the decision was made to proceed with PCI of the diagonal.  I suspected that this would be technically feasible, but felt the totally occluded intermediate branch would be difficult to open. The patient was loaded with Plavix 600 mg. Weight-based bivalirudin was given for anticoagulation. Once a therapeutic ACT was achieved, a 6 [place] XB 4 cm guide catheter was inserted.  A cougar coronary guidewire was used to cross the lesion.  The lesion was predilated with a 2.0 mm balloon.  The lesion was then stented with a 2.25 x 12 mm Resolute Integrity DES.  The stent was postdilated with a 2.5 mm noncompliant balloon.  Following PCI, there was 0% residual stenosis and TIMI-3 flow. Final angiography confirmed an excellent result. The guide catheter was then repositioned and the wire pulled back. I made a brief attempt with the cougar workhorse wire to cross the total occlusion in the intermediate branch. I then attempted with a feeler XT wire. Both of these wires were unsuccessful. I was able to advance them into the the beak of the occlusion, but could not cross. The patient tolerated the procedure well. There were no immediate procedural complications. A TR band was used for radial hemostasis. The patient was transferred to the post catheterization recovery area for further monitoring.  PCI Data: Vessel - diagonal/Segment - diagonal 1 Percent Stenosis (pre)  90 TIMI-flow 3 Stent 2.25x12 mm Resolute DES Percent Stenosis (post) 0 TIMI-flow (post) 3  Estimated Blood Loss: minimal  Final Conclusions:   1. 2 vessel CAD with severe diagonal stenosis and total occlusion of the ramus intermedius 2. Widely patent LAD and RCA 3. Preserved LV systolic function 4. Successful PCI of the  diagonal with a DES platform 5. Unable to cross total ramus occlusion   Recommendations:  DAPT With ASA and plavix x 12 months. Initiate medical therapy. If angina persists  would refer for CTO-PCI. Films reviewed with Dr Irish Lack.  [name] MD, [facility] 08/12/2014, 3:48 PM

## 2014-08-12 NOTE — H&P (View-Only) (Signed)
CARDIOLOGY OFFICE NOTE  Date:  08/10/2014    Luis [name] Date of Birth: 1941-03-19 Medical Record #[id number]  PCP:  [name], MD  Cardiologist:  [facility]  Chief Complaint  Patient presents with  . Chest Pain    Work in visit - seen for Dr. Percival Spanish    History of Present Illness: Luis [name] is a 74 y.o. male who presents today for a work in visit. Seen for Dr. Percival Spanish. He has no known CAD. Does have a history of HTN, HLD, prostate cancer and fibrosis due to sarcoid (remotely seen by Dr. Joya Gaskins).   Last seen in October of 2013 - that was for syncope - negative event monitor and negative Myoview at that time. Has not been seen since.  Comes in today. Referred by his PCP. Continues to smoke - rare cigar. He notes that he has basically done well since his last visit here - until about 3 to 4 months ago. He has started having a "burning" sensation across his chest - associated with SOB - sometimes with radiation down left arm - this is always with exertion. He has had spells with playing racquet ball. Now having with more of just daily activities. No spells at rest. Not using Viagra. Spells are getting worse but certainly not any better. He is anxious to proceed with "whatever is necessary".    Past Medical History  Diagnosis Date  . Hypertension   . Hyperlipidemia   . Sarcoidosis   . Cataract   . Skin cancer     left shoulder.and mid chest  . History of adenomatous polyp of colon 05/2005    8 mm adenoma  . Elevated lipase   . Diverticulosis 05/2005  . Gallstones     Past Surgical History  Procedure Laterality Date  . Tonsillectomy    . Lung surgery      partial removal right lung  . Colonoscopy w/ polypectomy  06/08/2005 and 10/18/2010    8 mm adenoma 2006, none 2012. Diverticulosis and internal hemorrhoids.  . Eye surgery Right     melanom  . Cataract extraction Bilateral   . Lymph node biopsy      mid chest  . Prostate surgery      radiation seed  implant  . Corneal transplant Bilateral   . Finger surgery Right     middle finger     Medications: Current Outpatient Prescriptions  Medication Sig Dispense Refill  . aspirin EC 81 MG tablet Take 81 mg by mouth every evening.    . celecoxib (CELEBREX) 200 MG capsule Take 200 mg by mouth 2 (two) times daily.     . clotrimazole-betamethasone (LOTRISONE) cream Apply 1 application topically 2 (two) times daily as needed (rash).     . Ergocalciferol (VITAMIN D2) 2000 UNITS TABS Take 2,000 Units by mouth every evening.    . ezetimibe (ZETIA) 10 MG tablet Take 10 mg by mouth every evening.     [name] glucosamine-chondroitin 500-400 MG tablet Take 1 tablet by mouth every evening.     [name] losartan (COZAAR) 50 MG tablet Take 50 mg by mouth every morning.    . Multiple Vitamin (MULTIVITAMIN WITH MINERALS) TABS tablet Take 1 tablet by mouth every evening.    . pravastatin (PRAVACHOL) 40 MG tablet Take 40 mg by mouth every evening.    [name] RA KRILL OIL 500 MG CAPS Take 500 mg by mouth every evening.    . sildenafil (REVATIO) 20 MG tablet Take  60-100 mg by mouth daily as needed (for erectile dysfunction).    . nitroGLYCERIN (NITROSTAT) 0.4 MG SL tablet Place 1 tablet (0.4 mg total) under the tongue every 5 (five) minutes as needed for chest pain. 25 tablet 3   No current facility-administered medications for this visit.    Allergies: No Known Allergies  Social History: The patient  reports that he has been smoking Cigars.  He has never used smokeless tobacco. He reports that he drinks about 0.5 oz of alcohol per week. He reports that he does not use illicit drugs.   Family History: The patient's family history includes Coronary artery disease (age of onset: 10) in his sister; Heart disease (age of onset: 45) in his father. There is no history of Colon cancer, Throat cancer, Pancreatic cancer, or Prostate cancer.   Review of Systems: Please see the history of present illness.   Otherwise, the review of  systems is positive for chest pain and DOE.   All other systems are reviewed and negative.   Physical Exam: VS:  BP 120/62 mmHg  Pulse 76  Ht 6' (1.829 m)  Wt 242 lb (109.77 kg)  BMI 32.81 kg/m2 .  BMI Body mass index is 32.81 kg/(m^2).   Oxygen level is 94% on RA.   Wt Readings from Last 3 Encounters:  08/10/14 242 lb (109.77 kg)  12/25/13 237 lb (107.502 kg)  10/22/13 236 lb 3.2 oz (107.14 kg)    General: Pleasant. Well developed, well nourished and in no acute distress.  HEENT: Normal. Neck: Supple, no JVD, carotid bruits, or masses noted.  Cardiac: Regular rate and rhythm. No murmurs, rubs, or gallops. No edema.  Respiratory:  Lungs are clear to auscultation bilaterally with normal work of breathing.  GI: Soft and nontender.  MS: No deformity or atrophy. Gait and ROM intact. Skin: Warm and dry. Color is normal.  Neuro:  Strength and sensation are intact and no gross focal deficits noted.  Psych: Alert, appropriate and with normal affect.   LABORATORY DATA:  EKG:  EKG is ordered today. This demonstrates NSR with 1st degree AV block and RBBB. Unchanged from prior tracing.   Lab Results  Component Value Date   WBC 5.9 08/03/2013   HGB 14.8 08/03/2013   HCT 41.5 08/03/2013   PLT 131* 08/03/2013   GLUCOSE 94 08/03/2013   ALT 31 08/03/2013   AST 34 08/03/2013   NA 139 08/03/2013   K 4.3 08/03/2013   CL 101 08/03/2013   CREATININE 0.88 08/03/2013   BUN 15 08/03/2013   CO2 27 08/03/2013   INR 0.97 08/23/2009    BNP (last 3 results) No results for input(s): BNP in the last 8760 hours.  ProBNP (last 3 results) No results for input(s): PROBNP in the last 8760 hours.   Other Studies Reviewed Today:  Myoview Impression from 01/2012 Exercise Capacity: Poor exercise capacity. BP Response: Normal blood pressure response. Clinical Symptoms: There is dyspnea. ECG Impression: No significant ST segment change suggestive of ischemia. Comparison with Prior Nuclear  Study: No images to compare  Overall Impression: Normal stress nuclear study. Baseline ECG with RBBB  LV Ejection Fraction: 62%. LV Wall Motion: NL LV Function; NL Wall Motion  [place]    Assessment/Plan: 1. Chest pain - exertional symptoms - sounds like unstable angina that is progressive. He needs cardiac cath. The patient understands that risks include but are not limited to stroke (1 in 1000), death (1 in 96), kidney failure [usually temporary] (  1 in 500), bleeding (1 in 200), allergic reaction [possibly serious] (1 in 200), and agrees to proceed. NTG sent in and he is instructed in how to take. He is to limit his activities in the interim.   2. HTN -  BP ok on current regimen.   3. HLD -  On statin  4. Ongoing tobacco abuse -  Smoking rare cigar.   5. Sarcoid  6. Fibrosis -  His sats do drop to 88% with walking from POD I to the lab. May need to refer back to pulmonary but will see what cath shows first.   Current medicines are reviewed with the patient today.  The patient does not have concerns regarding medicines other than what has been noted above.  The following changes have been made:  See above.  Labs/ tests ordered today include:    Orders Placed This Encounter  Procedures  . DG Chest 2 View  . Brain natriuretic peptide  . Basic metabolic panel  . CBC  . Protime-INR  . APTT  . EKG 12-Lead     Disposition:  Further disposition to follow.   Patient is agreeable to this plan and will call if any problems develop in the interim.   Signed: Burtis Junes, RN, ANP-C 08/10/2014 10:50 AM  [place] [address] [place], [place]  [address] Phone: [phone] Fax: [phone]

## 2014-08-12 NOTE — Interval H&P Note (Signed)
History and Physical Interval Note:  08/12/2014 2:31 PM  Luis [name]  has presented today for surgery, with the diagnosis of cp  The various methods of treatment have been discussed with the patient and family. After consideration of risks, benefits and other options for treatment, the patient has consented to  Procedure(s): LEFT HEART CATHETERIZATION WITH CORONARY ANGIOGRAM (N/A) as a surgical intervention .  The patient's history has been reviewed, patient examined, no change in status, stable for surgery.  I have reviewed the patient's chart and labs.  Questions were answered to the patient's satisfaction.    Cath Lab Visit (complete for each Cath Lab visit)  Clinical Evaluation Leading to the Procedure:   ACS: No.  Non-ACS:    Anginal Classification: CCS III  Anti-ischemic medical therapy: No Therapy  Non-Invasive Test Results: No non-invasive testing performed  Prior CABG: No previous CABG       [name]

## 2014-08-13 ENCOUNTER — Encounter (HOSPITAL_COMMUNITY): Payer: Self-pay | Admitting: Nurse Practitioner

## 2014-08-13 ENCOUNTER — Telehealth: Payer: Self-pay | Admitting: Nurse Practitioner

## 2014-08-13 DIAGNOSIS — E785 Hyperlipidemia, unspecified: Secondary | ICD-10-CM | POA: Diagnosis not present

## 2014-08-13 DIAGNOSIS — I2510 Atherosclerotic heart disease of native coronary artery without angina pectoris: Secondary | ICD-10-CM

## 2014-08-13 DIAGNOSIS — D869 Sarcoidosis, unspecified: Secondary | ICD-10-CM | POA: Diagnosis present

## 2014-08-13 DIAGNOSIS — I25110 Atherosclerotic heart disease of native coronary artery with unstable angina pectoris: Secondary | ICD-10-CM | POA: Diagnosis not present

## 2014-08-13 DIAGNOSIS — I10 Essential (primary) hypertension: Secondary | ICD-10-CM

## 2014-08-13 DIAGNOSIS — I2582 Chronic total occlusion of coronary artery: Secondary | ICD-10-CM | POA: Diagnosis not present

## 2014-08-13 DIAGNOSIS — J8410 Pulmonary fibrosis, unspecified: Secondary | ICD-10-CM | POA: Insufficient documentation

## 2014-08-13 DIAGNOSIS — I200 Unstable angina: Secondary | ICD-10-CM

## 2014-08-13 LAB — BASIC METABOLIC PANEL
Anion gap: 5 (ref 5–15)
BUN: 13 mg/dL (ref 6–23)
CO2: 28 mmol/L (ref 19–32)
Calcium: 8.5 mg/dL (ref 8.4–10.5)
Chloride: 104 mmol/L (ref 96–112)
Creatinine, Ser: 0.95 mg/dL (ref 0.50–1.35)
GFR calc Af Amer: >90 mL/min (ref >90)
GFR, EST NON AFRICAN AMERICAN: 81 mL/min — AB (ref >90)
GLUCOSE: 106 mg/dL — AB (ref 70–99)
POTASSIUM: 4.2 mmol/L (ref 3.5–5.1)
Sodium: 137 mmol/L (ref 135–145)

## 2014-08-13 LAB — CBC
HCT: 39.3 % (ref 39.0–52.0)
Hemoglobin: 13.8 g/dL (ref 13.0–17.0)
MCH: 32.5 pg (ref 26.0–34.0)
MCHC: 35.1 g/dL (ref 30.0–36.0)
MCV: 92.7 fL (ref 78.0–100.0)
Platelets: 112 10*3/uL — ABNORMAL LOW (ref 150–400)
RBC: 4.24 MIL/uL (ref 4.22–5.81)
RDW: 14.1 % (ref 11.5–15.5)
WBC: 5.5 10*3/uL (ref 4.0–10.5)

## 2014-08-13 MED ORDER — CLOPIDOGREL BISULFATE 75 MG PO TABS: 75.0000 mg | ORAL_TABLET | Freq: Every day | ORAL | Status: DC

## 2014-08-13 MED ORDER — METOPROLOL TARTRATE 25 MG PO TABS: 12.5000 mg | ORAL_TABLET | Freq: Two times a day (BID) | ORAL | Status: DC

## 2014-08-13 NOTE — Discharge Summary (Signed)
Discharge Summary   Patient ID: Luis [name],  MRN: [id number], DOB/AGE: December 03, 1940 74 y.o.  Admit date: 08/12/2014 Discharge date: 08/13/2014  Primary Care Provider: [facility] Primary Cardiologist: J. Hochrein, MD   Discharge Diagnoses Principal Problem:   Unstable angina  **Status post successful PCI and drug-eluting stent placement within the first diagonal branch of the LAD with attempted PCI of the ramus intermedius.  Active Problems:   CAD (coronary artery disease)   Essential hypertension   Sarcoidosis   Hyperlipidemia  Allergies No Known Allergies  Procedures  Cardiac Catheterization and Percutaneous Coronary Intervention 3.2.2016  Hemodynamics: AO 132/59 LV 140/17              Coronary angiography: Coronary dominance: right  Left mainstem: The left main is patent. There is no obstructive disease. The vessel divides into the LAD, intermediate branch, and left circumflex. Left anterior descending (LAD): The LAD is patent to the apex. The proximal LAD has diffuse irregularities. At the first septal perforator there is 30% stenosis. The mid and distal LAD are widely patent and the vessel wraps around the LV apex. The first diagonal is medium in caliber with a 95% focal stenosis.   **The first diagonal was successfully stented using a 2.25 x 12 mm resolute integrity drug-eluting stent.**  Left circumflex (LCx): The left circumflex courses down the AV groove and is small in caliber. There is an intermediate branch that is totally occluded proximally just after the left main divides. This is a large branch with 2 major subbranches that fill from left to left collaterals and retrograde fill back to the proximal vessel.   **An attempt was made to wire the occluded ramus intermedius however this was unsuccessful.**  Right coronary artery (RCA): This is a dominant vessel. The vessel is smooth throughout its course. The PDA, PLA, and acute marginal branches are all  patent. Left ventriculography: There is mild hypokinesis of the mid inferior wall. The LVEF is 60%. _____________   History of Present Illness   74 year old male without prior cardiac history. He does have a history of hypertension, hyperlipidemia, prostate cancer, and sarcoidosis. He was recently seen in clinic secondary to complaints of exertional chest burning with occasional radiation down the left arm. Decision was made to pursue outpatient diagnostic cardiac catheterization.  Hospital Course  Patient presented to the [facility] cardiac catheterization laboratory. Diagnostic catheterization was undertaken and revealed a 95% focal stenosis within a medium caliber first diagonal branch of the LAD. He also had an occluded ramus intermedius. He otherwise had nonobstructive CAD with normal LV function. The first diagonal was successfully intervened upon using a 2.25 x 12 mm resolute integrity drug-eluting stent. An attempt was made to wire the ramus intermedius however this was unsuccessful and further intervention was aborted. Postprocedure, patient has had no recurrence of chest pain or dyspnea. He has been ambulating without recurrent symptoms or limitations and will be discharged home today in good condition.   Discharge Vitals Blood pressure 164/81, pulse 68, temperature 98.4 F (36.9 C), temperature source Oral, resp. rate 23, height 6' (1.829 m), weight 240 lb 4.8 oz (109 kg), SpO2 95 %.  Filed Weights   08/12/14 1207 08/13/14 0016  Weight: 240 lb (108.863 kg) 240 lb 4.8 oz (109 kg)  General: Pleasant, NAD Psych: Normal affect. Neuro: Alert and oriented X 3. Moves all extremities spontaneously. HEENT: Normal  Neck: Supple without bruits or JVD. Lungs:  Resp regular and unlabored, CTA. Heart: RRR no s3, s4, or  murmurs. Abdomen: Soft, non-tender, non-distended, BS + x 4.  Extremities: No clubbing, cyanosis or edema. DP/PT/Radials 2+ and equal bilaterally.  Labs  CBC  Recent  Labs  08/10/14 1052 08/13/14 0338  WBC 7.8 5.5  HGB 15.1 13.8  HCT 43.7 39.3  MCV 93.2 92.7  PLT 139.0* 037*   Basic Metabolic Panel  Recent Labs  08/10/14 1052 08/13/14 0338  NA 137 137  K 4.0 4.2  CL 102 104  CO2 30 28  GLUCOSE 111* 106*  BUN 20 13  CREATININE 0.94 0.95  CALCIUM 9.5 8.5   Disposition  Pt is being discharged home today in good condition.  Follow-up Plans & Appointments  Follow-up Information    Follow up with Truitt Merle, NP On 08/25/2014.   Specialty:  Nurse Practitioner   Why:  2:00 PM   Contact information:   [place] [place] [address] [phone]       Follow up with [name], MD.   Specialty:  Family Medicine   Why:  as scheduled.   Contact information:   Dr. Daiva Eves [place] [address] [phone]       Discharge Medications    Medication List    STOP taking these medications        celecoxib 200 MG capsule  Commonly known as:  CELEBREX     sildenafil 20 MG tablet - at least until you have followed up with your cardiologist.  Commonly known as:  REVATIO      TAKE these medications        aspirin EC 81 MG tablet  Take 81 mg by mouth every evening.     clopidogrel 75 MG tablet  Commonly known as:  PLAVIX  Take 1 tablet (75 mg total) by mouth daily with breakfast.     clotrimazole-betamethasone cream  Commonly known as:  LOTRISONE  Apply 1 application topically 2 (two) times daily as needed (rash).     ezetimibe 10 MG tablet  Commonly known as:  ZETIA  Take 10 mg by mouth every evening.     glucosamine-chondroitin 500-400 MG tablet  Take 1 tablet by mouth every evening.     losartan 50 MG tablet  Commonly known as:  COZAAR  Take 25 mg by mouth every morning.     metoprolol tartrate 25 MG tablet  Commonly known as:  LOPRESSOR  Take 0.5 tablets (12.5 mg total) by mouth 2 (two) times daily.     multivitamin with minerals Tabs tablet  Take 1  tablet by mouth every evening.     nitroGLYCERIN 0.4 MG SL tablet  Commonly known as:  NITROSTAT  Place 1 tablet (0.4 mg total) under the tongue every 5 (five) minutes as needed for chest pain.     pravastatin 40 MG tablet  Commonly known as:  PRAVACHOL  Take 40 mg by mouth every evening.     RA KRILL OIL 500 MG Caps  Take 500 mg by mouth every evening.     Vitamin D2 2000 UNITS Tabs  Take 2,000 Units by mouth every evening.       Outstanding Labs/Studies  None  Duration of Discharge Encounter   Greater than 30 minutes including physician time.  Signed, Murray Hodgkins NP 08/13/2014, 8:26 AM  I saw & examined the patient during AM rounds on the day of d/c.   Was stable for d/c.   Agree with exam (performed by me). Ready for  d/c with plan noted above.  Leonie Man, M.D., M.S. Interventional Cardiologist   Pager # [phone]

## 2014-08-13 NOTE — Progress Notes (Signed)
CARDIAC REHAB PHASE I   PRE:  Rate/Rhythm: 80 SR  BP:  Supine:   Sitting: 144/71  Standing:    SaO2:   MODE:  Ambulation: 500 ft   POST:  Rate/Rhythm: 90 SR  BP:  Supine:   Sitting: 200/80, 181/68  Standing:    SaO2:  0820-0913 Pt walked 500 ft with steady gait. No CP. Tolerated well. Education completed with pt who voiced understanding. Stressed importance of plavix with stent. Reviewed NTG use. Gave smoking cessation handout, pt smokes occasional cigar and stated not giving up. Last one in January. Left smoking cessation handout. Gave heart healthy diet, ex ed and discussed CRP 2. Permission given to refer to [place] Phase 2. Works in [employer].   Graylon Good, RN BSN  08/13/2014 9:08 AM

## 2014-08-13 NOTE — Discharge Instructions (Signed)

## 2014-08-13 NOTE — Telephone Encounter (Signed)
New Msg          Pt has TCM appt with Truitt Merle on 08/25/14 per Ignacia Bayley.

## 2014-08-14 NOTE — Telephone Encounter (Signed)
Patient contacted regarding discharge from [facility] on 08/13/14.  Patient understands to follow up with Truitt Merle, NP on 08/25/14 at 2:00 pm at [facility]. Patient understands discharge instructions? yes Patient understands medications and regiment? yes Patient understands to bring all medications to this visit? yes

## 2014-08-18 NOTE — Progress Notes (Signed)
Will forward to Truitt Merle, NP to review.  The patient is scheduled for follow with her on 08/25/14.

## 2014-08-25 ENCOUNTER — Ambulatory Visit (INDEPENDENT_AMBULATORY_CARE_PROVIDER_SITE_OTHER): Payer: Medicare Other | Admitting: Pharmacist

## 2014-08-25 ENCOUNTER — Ambulatory Visit (INDEPENDENT_AMBULATORY_CARE_PROVIDER_SITE_OTHER): Payer: Medicare Other | Admitting: Nurse Practitioner

## 2014-08-25 VITALS — BP 150/72 | HR 73 | Ht 72.0 in | Wt 238.0 lb

## 2014-08-25 DIAGNOSIS — Z955 Presence of coronary angioplasty implant and graft: Secondary | ICD-10-CM

## 2014-08-25 DIAGNOSIS — I259 Chronic ischemic heart disease, unspecified: Secondary | ICD-10-CM

## 2014-08-25 DIAGNOSIS — I10 Essential (primary) hypertension: Secondary | ICD-10-CM | POA: Diagnosis not present

## 2014-08-25 DIAGNOSIS — I200 Unstable angina: Secondary | ICD-10-CM

## 2014-08-25 DIAGNOSIS — E785 Hyperlipidemia, unspecified: Secondary | ICD-10-CM | POA: Diagnosis not present

## 2014-08-25 MED ORDER — ATORVASTATIN CALCIUM 40 MG PO TABS: 40.0000 mg | ORAL_TABLET | Freq: Every day | ORAL | Status: DC

## 2014-08-25 NOTE — Patient Instructions (Addendum)
Stay on your current medicines but we will stop the Pravachol and will start Lipitor 40 mg - I have sent this to your mail order  Fasting labs with your primary care mid April    Stay on Zetia  Keep a check on your blood pressure  Ok to stop the Losartan  Gradually resume your activities  See me in 3 months but sooner if you have any change in symptoms  Call the [place] office at [phone] if you have any questions, problems or concerns.

## 2014-08-25 NOTE — Progress Notes (Signed)
Pharmacy Transitions of Care Post-ACS Visit  Admit Complaint: [name] is a very pleasant 74 yo male discharged on 08/13/14 with unstable angina and DES. PMH is significant for HTN, HLD, prostate cancer, sarcoidosis, and CAD. Patient presents to clinic for pharmacy medication reconciliation.  Medications and allergies were reviewed with patient. All medications were brought to today's appointment.  Current Outpatient Prescriptions  Medication Sig Dispense Refill  . acetaminophen (TYLENOL) 500 MG tablet Take 500 mg by mouth every 4 (four) hours as needed for moderate pain.    [name] aspirin EC 81 MG tablet Take 81 mg by mouth every evening.    . clopidogrel (PLAVIX) 75 MG tablet Take 1 tablet (75 mg total) by mouth daily with breakfast. 30 tablet 6  . Ergocalciferol (VITAMIN D2) 2000 UNITS TABS Take 2,000 Units by mouth every evening.    . ezetimibe (ZETIA) 10 MG tablet Take 10 mg by mouth every evening.     [name] glucosamine-chondroitin 500-400 MG tablet Take 1 tablet by mouth every evening.     [name] losartan (COZAAR) 50 MG tablet Take 25 mg by mouth every morning.     . metoprolol tartrate (LOPRESSOR) 25 MG tablet Take 0.5 tablets (12.5 mg total) by mouth 2 (two) times daily. 30 tablet 6  . Multiple Vitamin (MULTIVITAMIN WITH MINERALS) TABS tablet Take 1 tablet by mouth every evening.    . nitroGLYCERIN (NITROSTAT) 0.4 MG SL tablet Place 1 tablet (0.4 mg total) under the tongue every 5 (five) minutes as needed for chest pain. 25 tablet 3  . RA KRILL OIL 500 MG CAPS Take 500 mg by mouth every evening.    . clotrimazole-betamethasone (LOTRISONE) cream Apply 1 application topically 2 (two) times daily as needed (rash).     . silodosin (RAPAFLO) 8 MG CAPS capsule Take 8 mg by mouth as needed (When he takes trips (hasn't used recently)).     No current facility-administered medications for this visit.   ACS Medication Checklist:     Medication      YES      NO N/A or C/I and Explanation  Beta Blocker [x]   []    ACEi or ARB [x]  []     High-dose statin    (atorvastatin 40 or 80mg , rosuvastatin 20 or 40mg ) []  [x]  Pravastatin 40mg  + Zetia 10mg .  Clopidogrel +/- other         anti-platelets [x]         []     Aspirin [x]  []    Nitroglycerin [x]  []     Smoking cessation counseling provided []  [x]  Patient smokes ~1 cigar per month. Has not had any since January.  Cardiac rehab referral [x]         []      Assessment/Plan: refer to chart below.    Interventions during today's visit include: Intervention     YES      NO  Explanation   Indications      Needs Therapy []  [x]     Unnecessary Therapy []  [x]    Efficacy     Suboptimal Drug Selection [x]  []  Patient qualifies for high intensity statin therapy with UA and CAD. Patient reports that he has tried multiple statins in the past and has only had muscle aches with Crestor. Most recent lipid panel from November 2015: TC 194, LDL 111, HDL 60, TG 113. Patient willing to switch to Lipitor 40mg .  Insufficient Dose/Duration []  [x]     Safety      Adverse Drug Event [x]  []  Patient still  has some sildenafil left. Discussed the interaction with NTG and informed patient that he should not take them within 24 hours of each other.    Drug Interaction []  [x]     Excessive Dose/Duration []  [x]    Compliance     Underuse []  [x]     Overuse []  [x]     Medication changes today include discontinuing pravastatin and initiating atorvastatin 40mg  daily. Will follow up with patient by phone in 1 month to discuss adherence and any medication related concerns.  Jillane Po E. Casey Fye, Pharm.D Clinical Pharmacy Resident Pager: [phone] 08/25/2014 2:36 PM

## 2014-08-25 NOTE — Progress Notes (Signed)
CARDIOLOGY OFFICE NOTE  Date:  08/25/2014    Luis [name] Date of Birth: May 07, 1941 Medical Record #[id number]  PCP:  [name], MD  Cardiologist:  [facility]    Chief Complaint  Patient presents with  . Post PCI - DES to the DX    Seen for Dr. Percival Spanish     History of Present Illness: Luis [name] is a 74 y.o. male who presents today for a post hospital/TOC visit. Seen for Dr. Percival Spanish. He has no known CAD. Does have a history of HTN, HLD, prostate cancer and fibrosis due to sarcoid (remotely seen by Dr. Joya Gaskins).   Last seen here in October of 2013 - that was for syncope - negative event monitor and negative Myoview at that time. Had not been seen since.  I saw him back earlier this month - was having exertional chest pain - cardiac cath recommended. He underwent PCI of the DX. He does have a total occlusion of the ramus intermediate as well - advised to pursue intervention if he was to continue to have angina. His LV function is normal.   Comes in today. Here alone. He has met with the pharmacist prior to my visit. He is agreeable to changing back to Lipitor - his last LDL is 111 from November (on Pravachol and Zetia). He is doing well. No chest pain. Not short of breath. He is now on Lopressor and with this his BP is much lower. His PCP stopped his Losartan as of Friday. He has been monitoring at home and it has stayed down. He is gradually increasing his activities. He "pushed it" one day shortly after discharge and had some chest pain - took NTG x 1 with complete relief - has not recurred. He was out using a chain saw yesterday and did fine. Anxious to get back to the gym and to racquet ball.    Past Medical History  Diagnosis Date  . Hypertension   . Hyperlipidemia   . Sarcoidosis   . Cataract   . Skin cancer     left shoulder.and mid chest  . History of adenomatous polyp of colon 05/2005    8 mm adenoma  . Elevated lipase   . Diverticulosis 05/2005  .  Gallstones   . CAD (coronary artery disease)     a. 08/2014 Cath/PCI: LM nl, LAD 30p, D1 95 (2.25x12 Resolute Integrity DES), LCX small, RI 100 (attempted PCI) - branches fill via L->L collats, RCA dominant, nl, RPDA/PLA nl, EF 60^.    Past Surgical History  Procedure Laterality Date  . Tonsillectomy    . Lung surgery      partial removal right lung  . Colonoscopy w/ polypectomy  06/08/2005 and 10/18/2010    8 mm adenoma 2006, none 2012. Diverticulosis and internal hemorrhoids.  . Eye surgery Right     melanom  . Cataract extraction Bilateral   . Lymph node biopsy      mid chest  . Prostate surgery      radiation seed implant  . Corneal transplant Bilateral   . Finger surgery Right     middle finger  . Left heart catheterization with coronary angiogram N/A 08/12/2014    Procedure: LEFT HEART CATHETERIZATION WITH CORONARY ANGIOGRAM;  Surgeon: Blane Ohara, MD;  Location[facility] C[facility] LAB;  Service: Cardiovascular;  Laterality: N/A;     Medications: Current Outpatient Prescriptions  Medication Sig Dispense Refill  . acetaminophen (TYLENOL) 500 MG tablet Take 500 mg by  mouth every 4 (four) hours as needed for moderate pain.    [name] aspirin EC 81 MG tablet Take 81 mg by mouth every evening.    . clopidogrel (PLAVIX) 75 MG tablet Take 1 tablet (75 mg total) by mouth daily with breakfast. 30 tablet 6  . clotrimazole-betamethasone (LOTRISONE) cream Apply 1 application topically 2 (two) times daily as needed (rash).     . Ergocalciferol (VITAMIN D2) 2000 UNITS TABS Take 2,000 Units by mouth every evening.    . ezetimibe (ZETIA) 10 MG tablet Take 10 mg by mouth every evening.     [name] glucosamine-chondroitin 500-400 MG tablet Take 1 tablet by mouth every evening.     . metoprolol tartrate (LOPRESSOR) 25 MG tablet Take 0.5 tablets (12.5 mg total) by mouth 2 (two) times daily. 30 tablet 6  . Multiple Vitamin (MULTIVITAMIN WITH MINERALS) TABS tablet Take 1 tablet by mouth every evening.    .  nitroGLYCERIN (NITROSTAT) 0.4 MG SL tablet Place 1 tablet (0.4 mg total) under the tongue every 5 (five) minutes as needed for chest pain. 25 tablet 3  . RA KRILL OIL 500 MG CAPS Take 500 mg by mouth every evening.    . silodosin (RAPAFLO) 8 MG CAPS capsule Take 8 mg by mouth as needed (When he takes trips (hasn't used recently)).    [name] atorvastatin (LIPITOR) 40 MG tablet Take 1 tablet (40 mg total) by mouth daily. 90 tablet 3   No current facility-administered medications for this visit.    Allergies: Allergies  Allergen Reactions  . Crestor [Rosuvastatin] Other (See Comments)    Muscle aches with Crestor. Has tolerated multiple other statins just fine.    Social History: The patient  reports that he has been smoking Cigars.  He has never used smokeless tobacco. He reports that he drinks about 0.5 oz of alcohol per week. He reports that he does not use illicit drugs.   Family History: The patient's family history includes Coronary artery disease (age of onset: 68) in his sister; Heart disease (age of onset: 66) in his father. There is no history of Colon cancer, Throat cancer, Pancreatic cancer, or Prostate cancer.   Review of Systems: Please see the history of present illness.   Otherwise, the review of systems is positive for .   All other systems are reviewed and negative.   Physical Exam: VS:  BP 150/72 mmHg  Pulse 73  Ht 6' (1.829 m)  Wt 238 lb (107.956 kg)  BMI 32.27 kg/m2  SpO2 96% .  BMI Body mass index is 32.27 kg/(m^2).  Repeat BP by me is 122/80.  Wt Readings from Last 3 Encounters:  08/25/14 238 lb (107.956 kg)  08/13/14 240 lb 4.8 oz (109 kg)  08/10/14 242 lb (109.77 kg)    General: Pleasant. Well developed, well nourished and in no acute distress.  HEENT: Normal. Neck: Supple, no JVD, carotid bruits, or masses noted.  Cardiac: Regular rate and rhythm. No murmurs, rubs, or gallops. No edema.  Respiratory:  Lungs are clear to auscultation bilaterally with normal  work of breathing.  GI: Soft and nontender.  MS: No deformity or atrophy. Gait and ROM intact. Skin: Warm and dry. Color is normal.  Neuro:  Strength and sensation are intact and no gross focal deficits noted.  Psych: Alert, appropriate and with normal affect.   LABORATORY DATA:  EKG:  EKG is not ordered today.   Lab Results  Component Value Date   WBC 5.5 08/13/2014  HGB 13.8 08/13/2014   HCT 39.3 08/13/2014   PLT 112* 08/13/2014   GLUCOSE 106* 08/13/2014   ALT 31 08/03/2013   AST 34 08/03/2013   NA 137 08/13/2014   K 4.2 08/13/2014   CL 104 08/13/2014   CREATININE 0.95 08/13/2014   BUN 13 08/13/2014   CO2 28 08/13/2014   INR 1.0 08/10/2014    BNP (last 3 results) No results for input(s): BNP in the last 8760 hours.  ProBNP (last 3 results)  Recent Labs  08/10/14 1052  PROBNP 60.0   No results found for: CKTOTAL, CKMB, CKMBINDEX, TROPONINI  Other Studies Reviewed Today:  Cardiac Catheterization Procedure Note  Procedure: Left Heart Cath, Selective Coronary Angiography, LV angiography, PTCA and stenting of the first diagonal  Indication: Exertional chest pain, CCS Class 3 angina  Procedural Details: The right wrist was prepped, draped, and anesthetized with 1% lidocaine. Using the modified Seldinger technique, a 5/6 French Slender sheath was introduced into the right radial artery. 3 mg of verapamil was administered through the sheath, weight-based unfractionated heparin was administered intravenously. Standard Judkins catheters were used for selective coronary angiography and left ventriculography. Catheter exchanges were performed over an exchange length guidewire.  PROCEDURAL FINDINGS Hemodynamics: AO 132/59 LV 140/17  Coronary angiography: Coronary dominance: right  Left mainstem: The left main is patent. There is no obstructive disease. The vessel divides into the LAD, intermediate branch, and left circumflex.  Left anterior descending  (LAD): The LAD is patent to the apex. The proximal LAD has diffuse irregularities. At the first septal perforator there is 30% stenosis. The mid and distal LAD are widely patent and the vessel wraps around the LV apex. The first diagonal is medium in caliber with a 95% focal stenosis.  Left circumflex (LCx): The left circumflex courses down the AV groove and is small in caliber. There is an intermediate branch that is totally occluded proximally just after the left main divides. This is a large branch with 2 major subbranches that fill from left to left collaterals and retrograde fill back to the proximal vessel.  Right coronary artery (RCA): This is a dominant vessel. The vessel is smooth throughout its course. The PDA, PLA, and acute marginal branches are all patent.  Left ventriculography: There is mild hypokinesis of the mid inferior wall. The LVEF is 60%.  PCI Note: Following the diagnostic procedure, the decision was made to proceed with PCI of the diagonal. I suspected that this would be technically feasible, but felt the totally occluded intermediate branch would be difficult to open. The patient was loaded with Plavix 600 mg. Weight-based bivalirudin was given for anticoagulation. Once a therapeutic ACT was achieved, a 6 [place] XB 4 cm guide catheter was inserted. A cougar coronary guidewire was used to cross the lesion. The lesion was predilated with a 2.0 mm balloon. The lesion was then stented with a 2.25 x 12 mm Resolute Integrity DES. The stent was postdilated with a 2.5 mm noncompliant balloon. Following PCI, there was 0% residual stenosis and TIMI-3 flow. Final angiography confirmed an excellent result. The guide catheter was then repositioned and the wire pulled back. I made a brief attempt with the cougar workhorse wire to cross the total occlusion in the intermediate branch. I then attempted with a feeler XT wire. Both of these wires were unsuccessful. I was able to advance them  into the the beak of the occlusion, but could not cross. The patient tolerated the procedure well. There were no immediate  procedural complications. A TR band was used for radial hemostasis. The patient was transferred to the post catheterization recovery area for further monitoring.  PCI Data: Vessel - diagonal/Segment - diagonal 1 Percent Stenosis (pre) 90 TIMI-flow 3 Stent 2.25x12 mm Resolute DES Percent Stenosis (post) 0 TIMI-flow (post) 3  Estimated Blood Loss: minimal  Final Conclusions:  1. 2 vessel CAD with severe diagonal stenosis and total occlusion of the ramus intermedius 2. Widely patent LAD and RCA 3. Preserved LV systolic function 4. Successful PCI of the diagonal with a DES platform 5. Unable to cross total ramus occlusion   Recommendations:  DAPT With ASA and plavix x 12 months. Initiate medical therapy. If angina persists would refer for CTO-PCI. Films reviewed with Dr Irish Lack.  [name] MD, [facility] 08/12/2014, 3:48 PM   Assessment/Plan: 1. Chest pain - s/p cath with PCI - doing well clinically. No change in current regimen. He has a plan in place to progress his activity level. See back in 3 months but sooner if he were to have a change in symptoms. For now, we do not need to entertain further PCI.   2. HTN - BP ok on current regimen. He will continue to monitor. Holding Losartan for now. Repeat by me is fine.   3. HLD - On statin - changing to Lipitor 40 mg - he will need fasting labs in about 6 weeks - he would like to do this with his PCP  4. Ongoing tobacco abuse - Smoking rare cigar.   5. Sarcoid  6. Fibrosis - currently with no complaint of dyspnea.  Current medicines are reviewed with the patient today. The patient does not have concerns regarding medicines other than what has been noted above.  Current medicines are reviewed with the patient today.  The patient does not have concerns regarding medicines other than what has been noted  above.  The following changes have been made:  See above.  Labs/ tests ordered today include:   No orders of the defined types were placed in this encounter.     Disposition:   FU with me in 3 months. He would like to continue to see me due to easy access.   Patient is agreeable to this plan and will call if any problems develop in the interim.   Signed: Burtis Junes, RN, ANP-C 08/25/2014 2:43 PM  [place] [facility] [address] [place], [place]  [address] Phone: [phone] Fax: [phone]

## 2014-08-26 ENCOUNTER — Telehealth: Payer: Self-pay | Admitting: Nurse Practitioner

## 2014-08-26 NOTE — Telephone Encounter (Signed)
New message      Talk to [facility] regarding getting a referral to cardiac rehab at [facility]

## 2014-08-28 NOTE — Telephone Encounter (Signed)
Pt stated t/w doctor's office this am about cardiac rehab they will send over referral

## 2014-08-31 ENCOUNTER — Telehealth: Payer: Self-pay | Admitting: Nurse Practitioner

## 2014-08-31 ENCOUNTER — Other Ambulatory Visit: Payer: Self-pay | Admitting: *Deleted

## 2014-08-31 MED ORDER — ATORVASTATIN CALCIUM 40 MG PO TABS: 40.0000 mg | ORAL_TABLET | Freq: Every day | ORAL | Status: DC

## 2014-08-31 NOTE — Telephone Encounter (Signed)
Pt stated insurance would not cover Lipitor and would cover the generic atorvastatin. I sent in generic to [facility] today per patient. Will Mickle Asper

## 2014-08-31 NOTE — Telephone Encounter (Signed)
Generic Lipitor will be fine.

## 2014-08-31 NOTE — Telephone Encounter (Signed)
Pt c/o medication issue:  1. Name of Medication: Lipitor  2. How are you currently taking this medication (dosage and times per day)? Unknown  3. Are you having a reaction (difficulty breathing--STAT)? No  4. What is your medication issue? Pt would like to be prescribed the generic for Lipitor. Please call back and advise.

## 2014-09-01 ENCOUNTER — Telehealth: Payer: Self-pay | Admitting: Cardiology

## 2014-09-01 ENCOUNTER — Other Ambulatory Visit: Payer: Self-pay

## 2014-09-01 MED ORDER — CLOPIDOGREL BISULFATE 75 MG PO TABS: 75.0000 mg | ORAL_TABLET | Freq: Every day | ORAL | Status: DC

## 2014-09-01 MED ORDER — METOPROLOL TARTRATE 25 MG PO TABS: 12.5000 mg | ORAL_TABLET | Freq: Two times a day (BID) | ORAL | Status: DC

## 2014-09-01 NOTE — Telephone Encounter (Signed)
Pt is aware Cecille Rubin sent a letter to Verdis Frederickson at cardiac rehab and pt should be getting a phone call

## 2014-09-01 NOTE — Telephone Encounter (Signed)
Don't see in [facility] office note that Verdis Frederickson from cardiac rehab was sent a note.  Will Mickle Asper and have Cecille Rubin route back to me

## 2014-09-01 NOTE — Telephone Encounter (Signed)
Spoke with Luis [name], patient is anxious to start cardiac rehab. Aware dr Percival Spanish is on vacation and will return next week and will be able to get signed at that time.

## 2014-09-01 NOTE — Telephone Encounter (Signed)
I have sent a message to Card[facility] Rehab that he is ok to go if he would like.

## 2014-09-01 NOTE — Telephone Encounter (Signed)
F/U       Pt states that he was recommended him for a cardiac rehab program.   Pt requesting for this to be approved.    Please return call.

## 2014-09-10 ENCOUNTER — Encounter (HOSPITAL_COMMUNITY)
Admission: RE | Admit: 2014-09-10 | Discharge: 2014-09-10 | Disposition: A | Discharge status: Home / Self Care | Payer: Medicare Other | Source: Ambulatory Visit | Attending: Cardiology | Admitting: Cardiology

## 2014-09-10 NOTE — Progress Notes (Signed)
Cardiac Rehab Medication Review by a Pharmacist  Does the patient  feel that his/her medications are working for him/her?  yes  Has the patient been experiencing any side effects to the medications prescribed?  no  Does the patient measure his/her own blood pressure or blood glucose at home?  Yes. Systolic BP runs normally between 130-140.  Does the patient have any problems obtaining medications due to transportation or finances?   no  Understanding of regimen: good Understanding of indications: good Potential of compliance: good  Pharmacist comments: Pt reports taking medications as prescribed and no issues with current medications. Pt did ask about Plavix and Lopressor if he will ever get off these medications. I explained to the patient that with a stent Plavix will generally stay on for around 1 year and then may be continued depending on the physicians recommendation. I also explained the mortality benefit to the patient of Lopressor and that he will most likely remain on that indefinitely.    Luis [name] 09/10/2014 8:20 AM

## 2014-09-14 ENCOUNTER — Encounter (HOSPITAL_COMMUNITY)
Admission: RE | Admit: 2014-09-14 | Discharge: 2014-09-14 | Disposition: A | Discharge status: Home / Self Care | Payer: Medicare Other | Source: Ambulatory Visit | Attending: Cardiology | Admitting: Cardiology

## 2014-09-14 DIAGNOSIS — Z955 Presence of coronary angioplasty implant and graft: Secondary | ICD-10-CM | POA: Insufficient documentation

## 2014-09-14 NOTE — Progress Notes (Signed)
Pt in today for her first day of exercise in the 6:45 cardiac rehab phase II program.  Pt tolerated exercise with no complaints.  Monitor shows Sr with small P wave, neg qrs.which is present on her 12 lead ekg.  Medication list reconciled and pt verbalizes compliance to her medications and denies any barriers. Psychological Assessment PHQ2 score 0.  Pt denies any depression and feels "pretty good about life".  Pt with support system which includes spouse, church family, friends and co-workers. No needs identified at this time.  Will continue to periodically check in with pt to assess maintenance of pts well being. Short term goal is to get back to playing racquet ball knowing that his heart is ok. Pt is an avid racquet ball player and desires to be heart healthy enough to continue with this as a form of aerobic activity with no fear or reoccurrence of angina.  Playing racquet ball requires a met average of 7-8.  Will monitor pt progression with his met levels adherence to home exercise will be key in achieving this goal.  Pt belongs to the [employer] silver sne[employer] program and exercises on weight machines.  Long term gaol is to lose about 26 pounds to a ideal weight of 210.  Pt desires to be around for grandchildren who he sees often. Advised pt of nutritional classes offered on tuesdays- heart healthy and lifestyle skills. Monitor weights on days of exercise.  Will monitor pt progress toward achieving this goal. Carlette Carlton RN, BSN 

## 2014-09-16 ENCOUNTER — Encounter (HOSPITAL_COMMUNITY): Payer: Medicare Other

## 2014-09-18 ENCOUNTER — Encounter (HOSPITAL_COMMUNITY): Payer: Medicare Other

## 2014-09-21 ENCOUNTER — Encounter (HOSPITAL_COMMUNITY): Payer: Medicare Other

## 2014-09-23 ENCOUNTER — Encounter (HOSPITAL_COMMUNITY)
Admission: RE | Admit: 2014-09-23 | Discharge: 2014-09-23 | Disposition: A | Discharge status: Home / Self Care | Payer: Medicare Other | Source: Ambulatory Visit | Attending: Cardiology | Admitting: Cardiology

## 2014-09-23 DIAGNOSIS — Z955 Presence of coronary angioplasty implant and graft: Secondary | ICD-10-CM | POA: Diagnosis not present

## 2014-09-23 NOTE — Progress Notes (Signed)
Pt returned to exercise today after brief absence. Quality of Life Survey Results -  Pt with scores well above 21. No needs identified, no further intervention needed at this time.  Continue to evaluate and periodic check in for mental well being. Cherre Huger, BSN

## 2014-09-25 ENCOUNTER — Encounter (HOSPITAL_COMMUNITY)
Admission: RE | Admit: 2014-09-25 | Discharge: 2014-09-25 | Disposition: A | Discharge status: Home / Self Care | Payer: Medicare Other | Source: Ambulatory Visit | Attending: Cardiology | Admitting: Cardiology

## 2014-09-25 DIAGNOSIS — Z955 Presence of coronary angioplasty implant and graft: Secondary | ICD-10-CM | POA: Diagnosis not present

## 2014-09-28 ENCOUNTER — Encounter (HOSPITAL_COMMUNITY)
Admission: RE | Admit: 2014-09-28 | Discharge: 2014-09-28 | Disposition: A | Discharge status: Home / Self Care | Payer: Medicare Other | Source: Ambulatory Visit | Attending: Cardiology | Admitting: Cardiology

## 2014-09-28 ENCOUNTER — Telehealth: Payer: Self-pay | Admitting: Nurse Practitioner

## 2014-09-28 ENCOUNTER — Encounter: Payer: Self-pay | Admitting: Cardiovascular Disease

## 2014-09-28 DIAGNOSIS — E785 Hyperlipidemia, unspecified: Secondary | ICD-10-CM

## 2014-09-28 DIAGNOSIS — Z955 Presence of coronary angioplasty implant and graft: Secondary | ICD-10-CM | POA: Diagnosis not present

## 2014-09-28 MED ORDER — PRAVASTATIN SODIUM 40 MG PO TABS: 40.0000 mg | ORAL_TABLET | Freq: Every evening | ORAL | Status: DC

## 2014-09-28 NOTE — Telephone Encounter (Signed)
New message       Pt request to talk to Truitt Merle regarding his lipitor.  He would not tell me what he wanted

## 2014-09-28 NOTE — Progress Notes (Signed)
Reviewed home exercise guidelines with patient including endpoints, temperature precautions, target heart rate and rate of perceived exertion. Pt is walking 30 minutes on 3 days outside of cardiac rehab as his mode of home exercise. [facility]. Pt voices understanding of instructions given. [name], MS, ACSM CCEP

## 2014-09-28 NOTE — Telephone Encounter (Addendum)
The pt is advised Per Truitt Merle, NP that Red Yeast Rice will not be enough to control his cholesterol level and that she wants him to stop taking Lipitor and go back on Pravastatin 40 mg daily along with Zetia 10 mg daily. Also, per Cecille Rubin the pt needs repeat lab work in 6 weeks. The pt verbalized understanding and agrees with plan. The pt states that he still has some Pravachol 40 mg at home and does not need refill at this time.  Lipids and LFTs ordered and scheduled to be drawn on 11/10/14, the pt agrees with date and time. Also, the pt is advised that Cecille Rubin says he may play Racket ball, he verbalized understanding.

## 2014-09-28 NOTE — Telephone Encounter (Signed)
The pt states that he is having aches in his legs, arms and shoulders and feels that Lipitor maybe the cause. He wants to know if he can take Red Yeast rice instead and recheck a lipids in 1 month. He also wants to know if it is okay for him to play Racket ball.  Will discuss with Truitt Merle, NP and call the pt back.

## 2014-09-30 ENCOUNTER — Encounter (HOSPITAL_COMMUNITY)
Admission: RE | Admit: 2014-09-30 | Discharge: 2014-09-30 | Disposition: A | Discharge status: Home / Self Care | Payer: Medicare Other | Source: Ambulatory Visit | Attending: Cardiology | Admitting: Cardiology

## 2014-09-30 DIAGNOSIS — Z955 Presence of coronary angioplasty implant and graft: Secondary | ICD-10-CM | POA: Diagnosis not present

## 2014-10-02 ENCOUNTER — Encounter (HOSPITAL_COMMUNITY)
Admission: RE | Admit: 2014-10-02 | Discharge: 2014-10-02 | Disposition: A | Discharge status: Home / Self Care | Payer: Medicare Other | Source: Ambulatory Visit | Attending: Cardiology | Admitting: Cardiology

## 2014-10-02 DIAGNOSIS — Z955 Presence of coronary angioplasty implant and graft: Secondary | ICD-10-CM | POA: Diagnosis not present

## 2014-10-05 ENCOUNTER — Encounter (HOSPITAL_COMMUNITY)
Admission: RE | Admit: 2014-10-05 | Discharge: 2014-10-05 | Disposition: A | Discharge status: Home / Self Care | Payer: Medicare Other | Source: Ambulatory Visit | Attending: Cardiology | Admitting: Cardiology

## 2014-10-05 DIAGNOSIS — Z955 Presence of coronary angioplasty implant and graft: Secondary | ICD-10-CM | POA: Diagnosis not present

## 2014-10-07 ENCOUNTER — Encounter (HOSPITAL_COMMUNITY)
Admission: RE | Admit: 2014-10-07 | Discharge: 2014-10-07 | Disposition: A | Discharge status: Home / Self Care | Payer: Medicare Other | Source: Ambulatory Visit | Attending: Cardiology | Admitting: Cardiology

## 2014-10-07 DIAGNOSIS — Z955 Presence of coronary angioplasty implant and graft: Secondary | ICD-10-CM | POA: Diagnosis not present

## 2014-10-09 ENCOUNTER — Encounter (HOSPITAL_COMMUNITY)
Admission: RE | Admit: 2014-10-09 | Discharge: 2014-10-09 | Disposition: A | Discharge status: Home / Self Care | Payer: Medicare Other | Source: Ambulatory Visit | Attending: Cardiology | Admitting: Cardiology

## 2014-10-09 DIAGNOSIS — Z955 Presence of coronary angioplasty implant and graft: Secondary | ICD-10-CM | POA: Diagnosis not present

## 2014-10-09 NOTE — Progress Notes (Signed)
Luis [name] 74 y.o. male Nutrition Note Spoke with pt.  Nutrition Survey reviewed with pt. Pt is following Step 2 of the Therapeutic Lifestyle Changes diet. Pt wants to lose wt. Pt has been trying to lose wt by eating healthier and decreasing portion sizes. Wt loss tips reviewed. Pt expressed understanding of the information reviewed. Pt aware of nutrition education classes offered.  Nutrition Diagnosis ? Food-and nutrition-related knowledge deficit related to lack of exposure to information as related to diagnosis of: ? CVD ? Obesity related to excessive energy intake as evidenced by a BMI of 34.1  Nutrition Intervention ? Benefits of adopting Therapeutic Lifestyle Changes discussed when Medficts reviewed. ? Pt to attend the Portion Distortion class ? Pt to attend the  ? Nutrition I class - met; 09/15/14                    ? Nutrition II class ? Pt given handouts for: ? Nutrition II class ? Continue client-centered nutrition education by RD, as part of interdisciplinary care.  Goal(s) ? Pt to identify food quantities necessary to achieve: ? wt loss to a goal wt of 216-234 lb (98.4-106.6 kg) at graduation from cardiac rehab.   Monitor and Evaluate progress toward nutrition goal with team.  Derek Mound, M.Ed, RD, LDN, CDE 10/09/2014 8:44 AM

## 2014-10-12 ENCOUNTER — Encounter (HOSPITAL_COMMUNITY)
Admission: RE | Admit: 2014-10-12 | Discharge: 2014-10-12 | Disposition: A | Discharge status: Home / Self Care | Payer: Medicare Other | Source: Ambulatory Visit | Attending: Cardiology | Admitting: Cardiology

## 2014-10-12 DIAGNOSIS — Z955 Presence of coronary angioplasty implant and graft: Secondary | ICD-10-CM | POA: Insufficient documentation

## 2014-10-14 ENCOUNTER — Encounter (HOSPITAL_COMMUNITY)
Admission: RE | Admit: 2014-10-14 | Discharge: 2014-10-14 | Disposition: A | Discharge status: Home / Self Care | Payer: Medicare Other | Source: Ambulatory Visit | Attending: Cardiology | Admitting: Cardiology

## 2014-10-14 DIAGNOSIS — Z955 Presence of coronary angioplasty implant and graft: Secondary | ICD-10-CM | POA: Diagnosis not present

## 2014-10-16 ENCOUNTER — Encounter (HOSPITAL_COMMUNITY)
Admission: RE | Admit: 2014-10-16 | Discharge: 2014-10-16 | Disposition: A | Discharge status: Home / Self Care | Payer: Medicare Other | Source: Ambulatory Visit | Attending: Cardiology | Admitting: Cardiology

## 2014-10-16 DIAGNOSIS — Z955 Presence of coronary angioplasty implant and graft: Secondary | ICD-10-CM | POA: Diagnosis not present

## 2014-10-19 ENCOUNTER — Encounter (HOSPITAL_COMMUNITY)
Admission: RE | Admit: 2014-10-19 | Discharge: 2014-10-19 | Disposition: A | Discharge status: Home / Self Care | Payer: Medicare Other | Source: Ambulatory Visit | Attending: Cardiology | Admitting: Cardiology

## 2014-10-19 DIAGNOSIS — Z955 Presence of coronary angioplasty implant and graft: Secondary | ICD-10-CM | POA: Diagnosis not present

## 2014-10-21 ENCOUNTER — Encounter (HOSPITAL_COMMUNITY)
Admission: RE | Admit: 2014-10-21 | Discharge: 2014-10-21 | Disposition: A | Discharge status: Home / Self Care | Payer: Medicare Other | Source: Ambulatory Visit | Attending: Cardiology | Admitting: Cardiology

## 2014-10-21 ENCOUNTER — Encounter: Payer: Self-pay | Admitting: *Deleted

## 2014-10-21 DIAGNOSIS — Z955 Presence of coronary angioplasty implant and graft: Secondary | ICD-10-CM | POA: Diagnosis not present

## 2014-10-23 ENCOUNTER — Telehealth: Payer: Self-pay | Admitting: Nurse Practitioner

## 2014-10-23 ENCOUNTER — Encounter (HOSPITAL_COMMUNITY)
Admission: RE | Admit: 2014-10-23 | Discharge: 2014-10-23 | Disposition: A | Discharge status: Home / Self Care | Payer: Medicare Other | Source: Ambulatory Visit | Attending: Cardiology | Admitting: Cardiology

## 2014-10-23 DIAGNOSIS — Z955 Presence of coronary angioplasty implant and graft: Secondary | ICD-10-CM | POA: Diagnosis not present

## 2014-10-23 NOTE — Progress Notes (Signed)
Pt graduated from cardiac rehab program today with completion of 12 exercise sessions in Phase II. Pt elected to discontinue his participation in cardiac rehab due to high copay.  Pt pays 40.00 per session. Pt maintained good attendance and progressed nicely during his participation in rehab as evidenced by increased MET level to 4.1.   Medication list reconciled. Repeat  PHQ score-0 .  Pt has a very positive outlook on life and enjoys having a good time.  Pt has the huge support of his wife. Pt has made lifestyle changes and should be commended for his success even during the short period of time. Pt lost 4.6 pounds during his short time with [place].  Pt feels he has the tools needed to continue with  Pt feels he has achieved his goals during cardiac rehab. Pt given clearance to return to playing raquet ball.  Cautioned pt to hold off on this due to his statement that he has been having chest tightness off and on over a couple of weeks. Pt describes this discomfort as extremely minor goes away on its own with no further complaint that day but may come back in 3-4 days.  Pt aware that he has one additional blockage that is being treated medically.  Pt does not take ntg because the tightness is so slight and quickly goes away.  This occurs at random activity levels with no real trigger.  Pt plans to contact Dr Warren Lacy office today upon urging of the rehab staff.  Offered to call the office on pt behalf, pt declined and preferred to contact them himself. Pt plans to continue exercise at the [employer] 3-4 times a week when medically appropriate.  This is a delightful pt and we enjoyed having him. Maurice Small RN

## 2014-10-23 NOTE — Telephone Encounter (Signed)
Spoke with pt about his concerns.  Advised to use Tylenol prn to which he reports "that does as much good as drinking a glass of water."  States he would like to come off of blood thinners so that he can have his back pain managed as it is almost unbearable.  States his friend uses Tramadol and he wants to know if he can take this.  Advised d/t his recent stent placement he can not come off Plavix at this time.  He would like a recommendation from [place].  He is aware she is not in the office today.   In reference to chest pain - he reports sometimes occurs once or twice a day but other times not at all, doesn't occur with activity or exercise.  Reports the discomfort is a 1 or less (scale 1 - 10) when it occurs.  It can last a few seconds or up to 5 mins.   Usually resolves on its own but he has taken SL Ntg X 2 since his procedure though not recently.  No SOB no other s/s.  He states he has not reported it because he really doesn't think it is a big deal.  Advised I will forward information for further recommendations.  He should use SL Ntg if chest pain reoccurs and report to ED for eval if no relief.

## 2014-10-23 NOTE — Telephone Encounter (Signed)
Left message to call back to discuss his concerns.  

## 2014-10-23 NOTE — Telephone Encounter (Signed)
New message      Pt is on plavix.  What can he take for arthritis?  Aleve and advil he cannot take. While at cardiac rehab---pt sometimes have "minor heart pain".  It is is very random and does not last long.  He can be exercising or sitting down.  Cardiac rehab told him to call the office.  He waits a few minutes and then it goes away.  Nitro is not needed.  Please advise on both

## 2014-10-26 ENCOUNTER — Encounter (HOSPITAL_COMMUNITY): Payer: Medicare Other

## 2014-10-26 ENCOUNTER — Telehealth: Payer: Self-pay

## 2014-10-26 NOTE — Telephone Encounter (Signed)
Left message to call back  

## 2014-10-26 NOTE — Telephone Encounter (Signed)
-----   Message from Burtis Junes, NP sent at 10/26/2014  7:44 AM EDT ----- This is in regards to his phone call from last week.  Due to recent stent placement, he cannot stop his Plavix. If he does he risks his stent clotting up which can be life threatening.  He may see his PCP regarding a RX for Tramadol - I do not prescribe this.  Cecille Rubin

## 2014-10-27 NOTE — Telephone Encounter (Signed)
Patient called back. Informed him of Truitt Merle NP reply to his message last week, due to recent stent placement, he cannot stop his Plavix. Patient states he is aware of this. Also informed him of following up with his PCP in regards to a prescription for Tramadol. Patient verbalized understanding.

## 2014-10-28 ENCOUNTER — Encounter (HOSPITAL_COMMUNITY): Payer: Medicare Other

## 2014-10-30 ENCOUNTER — Encounter (HOSPITAL_COMMUNITY): Payer: Medicare Other

## 2014-11-02 ENCOUNTER — Encounter (HOSPITAL_COMMUNITY): Payer: Medicare Other

## 2014-11-04 ENCOUNTER — Encounter (HOSPITAL_COMMUNITY): Payer: Medicare Other

## 2014-11-06 ENCOUNTER — Encounter (HOSPITAL_COMMUNITY): Payer: Medicare Other

## 2014-11-10 ENCOUNTER — Other Ambulatory Visit (INDEPENDENT_AMBULATORY_CARE_PROVIDER_SITE_OTHER): Payer: Medicare Other | Admitting: *Deleted

## 2014-11-10 ENCOUNTER — Other Ambulatory Visit: Payer: Medicare Other

## 2014-11-10 DIAGNOSIS — E785 Hyperlipidemia, unspecified: Secondary | ICD-10-CM

## 2014-11-10 LAB — HEPATIC FUNCTION PANEL
ALT: 34 U/L (ref 0–53)
AST: 39 U/L — ABNORMAL HIGH (ref 0–37)
Albumin: 4.2 g/dL (ref 3.5–5.2)
Alkaline Phosphatase: 68 U/L (ref 39–117)
Bilirubin, Direct: 0.2 mg/dL (ref 0.0–0.3)
Total Bilirubin: 0.6 mg/dL (ref 0.2–1.2)
Total Protein: 6.8 g/dL (ref 6.0–8.3)

## 2014-11-10 LAB — LIPID PANEL
Cholesterol: 161 mg/dL (ref 0–200)
HDL: 44.7 mg/dL (ref >39.00)
LDL Cholesterol: 95 mg/dL (ref 0–99)
NonHDL: 116.3
Total CHOL/HDL Ratio: 4
Triglycerides: 108 mg/dL (ref 0.0–149.0)
VLDL: 21.6 mg/dL (ref 0.0–40.0)

## 2014-11-11 ENCOUNTER — Encounter (HOSPITAL_COMMUNITY): Payer: Medicare Other

## 2014-11-13 ENCOUNTER — Encounter (HOSPITAL_COMMUNITY): Payer: Medicare Other

## 2014-11-16 ENCOUNTER — Encounter (HOSPITAL_COMMUNITY): Payer: Medicare Other

## 2014-11-18 ENCOUNTER — Encounter (HOSPITAL_COMMUNITY): Payer: Medicare Other

## 2014-11-20 ENCOUNTER — Encounter (HOSPITAL_COMMUNITY): Payer: Medicare Other

## 2014-11-20 ENCOUNTER — Ambulatory Visit: Payer: Medicare Other | Admitting: Nurse Practitioner

## 2014-11-23 ENCOUNTER — Encounter (HOSPITAL_COMMUNITY): Payer: Medicare Other

## 2014-11-25 ENCOUNTER — Encounter (HOSPITAL_COMMUNITY): Payer: Medicare Other

## 2014-11-27 ENCOUNTER — Encounter (HOSPITAL_COMMUNITY): Payer: Medicare Other

## 2014-11-30 ENCOUNTER — Encounter (HOSPITAL_COMMUNITY): Payer: Medicare Other

## 2014-12-02 ENCOUNTER — Encounter (HOSPITAL_COMMUNITY): Payer: Medicare Other

## 2014-12-04 ENCOUNTER — Encounter (HOSPITAL_COMMUNITY): Payer: Medicare Other

## 2014-12-07 ENCOUNTER — Encounter (HOSPITAL_COMMUNITY): Payer: Medicare Other

## 2014-12-09 ENCOUNTER — Encounter (HOSPITAL_COMMUNITY): Payer: Medicare Other

## 2014-12-09 ENCOUNTER — Encounter: Payer: Self-pay | Admitting: Nurse Practitioner

## 2014-12-09 ENCOUNTER — Ambulatory Visit (INDEPENDENT_AMBULATORY_CARE_PROVIDER_SITE_OTHER): Payer: Medicare Other | Admitting: Nurse Practitioner

## 2014-12-09 VITALS — BP 148/76 | HR 64 | Ht 71.75 in | Wt 233.0 lb

## 2014-12-09 DIAGNOSIS — I10 Essential (primary) hypertension: Secondary | ICD-10-CM

## 2014-12-09 DIAGNOSIS — Z955 Presence of coronary angioplasty implant and graft: Secondary | ICD-10-CM

## 2014-12-09 DIAGNOSIS — I259 Chronic ischemic heart disease, unspecified: Secondary | ICD-10-CM

## 2014-12-09 DIAGNOSIS — E785 Hyperlipidemia, unspecified: Secondary | ICD-10-CM | POA: Diagnosis not present

## 2014-12-09 MED ORDER — ISOSORBIDE MONONITRATE ER 30 MG PO TB24: 30.0000 mg | ORAL_TABLET | Freq: Every day | ORAL | Status: DC

## 2014-12-09 NOTE — Progress Notes (Signed)
CARDIOLOGY OFFICE NOTE  Date:  12/09/2014    Luis [name] Date of Birth: 01/10/1941 Medical Record #[id number]  PCP:  [name], MD  Cardiologist:  [facility]    Chief Complaint  Patient presents with  . Coronary Artery Disease    Follow up visit at request of cardiac rehab - seen for Dr. Percival Spanish.     History of Present Illness: Luis [name] is a 74 y.o. male who presents today for a follow up visit at the request of cardiac rehab. Seen for Dr. Percival Spanish. Now with known CAD. Does have a history of HTN, HLD, prostate cancer and fibrosis due to sarcoid (remotely seen by Dr. Joya Gaskins).   Last seen here in October of 2013 - that was for syncope - negative event monitor and negative Myoview at that time. Had not been seen since.  I saw him back in March - was having exertional chest pain - cardiac cath recommended. He underwent PCI of the DX by Dr. Burt Knack.  He does have a total occlusion of the ramus intermediate as well - advised to pursue intervention if he was to continue to have angina. His LV function is normal.   I saw him back for his post hospital visit back in March - he was doing well. ARB had been stopped by PCP due to low BP. He was participating in cardiac rehab.   Comes in today. Here alone. Wanting to resume playing raquetball. However, he is still having some chest pain. It is quite random. Will last about 5 minutes. Is not exertional. No NTG use. Identical to prior chest pain syndrome. Not getting worse and maybe getting less frequent. He is not using Viagra. He is exercising. Otherwise he has no complaint.    Past Medical History  Diagnosis Date  . Hypertension   . Hyperlipidemia   . Sarcoidosis   . Cataract   . Skin cancer     left shoulder.and mid chest  . History of adenomatous polyp of colon 05/2005    8 mm adenoma  . Elevated lipase   . Diverticulosis 05/2005  . Gallstones   . CAD (coronary artery disease)     a. 08/2014 Cath/PCI: LM nl, LAD  30p, D1 95 (2.25x12 Resolute Integrity DES), LCX small, RI 100 (attempted PCI) - branches fill via L->L collats, RCA dominant, nl, RPDA/PLA nl, EF 60^.    Past Surgical History  Procedure Laterality Date  . Tonsillectomy    . Lung surgery      partial removal right lung  . Colonoscopy w/ polypectomy  06/08/2005 and 10/18/2010    8 mm adenoma 2006, none 2012. Diverticulosis and internal hemorrhoids.  . Eye surgery Right     melanom  . Cataract extraction Bilateral   . Lymph node biopsy      mid chest  . Prostate surgery      radiation seed implant  . Corneal transplant Bilateral   . Finger surgery Right     middle finger  . Left heart catheterization with coronary angiogram N/A 08/12/2014    Procedure: LEFT HEART CATHETERIZATION WITH CORONARY ANGIOGRAM;  Surgeon: Blane Ohara, MD;  Location: [facility] CATH LAB;  Service: Cardiovascular;  Laterality: N/A;     Medications: Current Outpatient Prescriptions  Medication Sig Dispense Refill  . aspirin EC 81 MG tablet Take 81 mg by mouth every evening.    . clopidogrel (PLAVIX) 75 MG tablet Take 1 tablet (75 mg total) by mouth daily with breakfast.  90 tablet 1  . clotrimazole-betamethasone (LOTRISONE) cream Apply 1 application topically 2 (two) times daily as needed (rash).     . Ergocalciferol (VITAMIN D2) 2000 UNITS TABS Take 2,000 Units by mouth every evening.    . ezetimibe (ZETIA) 10 MG tablet Take 10 mg by mouth every evening.     . fluocinonide ointment (LIDEX) 2.09 % Apply 1 application topically as needed (Rash).    [name] glucosamine-chondroitin 500-400 MG tablet Take 1 tablet by mouth every evening.     . metoprolol tartrate (LOPRESSOR) 25 MG tablet Take 0.5 tablets (12.5 mg total) by mouth 2 (two) times daily. 90 tablet 1  . Multiple Vitamin (MULTIVITAMIN WITH MINERALS) TABS tablet Take 1 tablet by mouth every evening.    . nitroGLYCERIN (NITROSTAT) 0.4 MG SL tablet Place 1 tablet (0.4 mg total) under the tongue every 5 (five) minutes as  needed for chest pain. 25 tablet 3  . pravastatin (PRAVACHOL) 40 MG tablet Take 1 tablet (40 mg total) by mouth every evening. 90 tablet 3  . silodosin (RAPAFLO) 8 MG CAPS capsule Take 8 mg by mouth as needed (When he takes trips (hasn't used recently)).    . traMADol (ULTRAM) 50 MG tablet Take by mouth every 6 (six) hours as needed.    . isosorbide mononitrate (IMDUR) 30 MG 24 hr tablet Take 1 tablet (30 mg total) by mouth daily. 30 tablet 6   No current facility-administered medications for this visit.    Allergies: Allergies  Allergen Reactions  . Crestor [Rosuvastatin] Other (See Comments)    Muscle aches with Crestor. Has tolerated multiple other statins just fine.    Social History: The patient  reports that he has been smoking Cigars.  He has never used smokeless tobacco. He reports that he drinks about 0.5 oz of alcohol per week. He reports that he does not use illicit drugs.   Family History: The patient's family history includes Coronary artery disease (age of onset: 39) in his sister; Heart disease (age of onset: 70) in his father. There is no history of Colon cancer, Throat cancer, Pancreatic cancer, or Prostate cancer.   Review of Systems: Please see the history of present illness.   Otherwise, the review of systems is positive for none.   All other systems are reviewed and negative.   Physical Exam: VS:  BP 148/76 mmHg  Pulse 64  Ht 5' 11.75" (1.822 m)  Wt 233 lb (105.688 kg)  BMI 31.84 kg/m2 .  BMI Body mass index is 31.84 kg/(m^2).  Wt Readings from Last 3 Encounters:  12/09/14 233 lb (105.688 kg)  09/10/14 240 lb 15.4 oz (109.3 kg)  08/25/14 238 lb (107.956 kg)    General: Pleasant. Well developed, well nourished and in no acute distress.  HEENT: Normal. Neck: Supple, no JVD, carotid bruits, or masses noted.  Cardiac: Regular rate and rhythm. No murmurs, rubs, or gallops. No edema.  Respiratory:  Lungs are clear to auscultation bilaterally with normal work  of breathing.  GI: Soft and nontender.  MS: No deformity or atrophy. Gait and ROM intact. Skin: Warm and dry. Color is normal.  Neuro:  Strength and sensation are intact and no gross focal deficits noted.  Psych: Alert, appropriate and with normal affect.   LABORATORY DATA:  EKG:  EKG is ordered today. This shows sinus with a bifascicular block - unchanged.   Lab Results  Component Value Date   WBC 5.5 08/13/2014   HGB 13.8 08/13/2014  HCT 39.3 08/13/2014   PLT 112* 08/13/2014   GLUCOSE 106* 08/13/2014   CHOL 161 11/10/2014   TRIG 108.0 11/10/2014   HDL 44.70 11/10/2014   LDLCALC 95 11/10/2014   ALT 34 11/10/2014   AST 39* 11/10/2014   NA 137 08/13/2014   K 4.2 08/13/2014   CL 104 08/13/2014   CREATININE 0.95 08/13/2014   BUN 13 08/13/2014   CO2 28 08/13/2014   INR 1.0 08/10/2014    BNP (last 3 results) No results for input(s): BNP in the last 8760 hours.  ProBNP (last 3 results)  Recent Labs  08/10/14 1052  PROBNP 60.0     Other Studies Reviewed Today: Cardiac Catheterization Procedure Note  Procedure: Left Heart Cath, Selective Coronary Angiography, LV angiography, PTCA and stenting of the first diagonal  Indication: Exertional chest pain, CCS Class 3 angina  Procedural Details: The right wrist was prepped, draped, and anesthetized with 1% lidocaine. Using the modified Seldinger technique, a 5/6 French Slender sheath was introduced into the right radial artery. 3 mg of verapamil was administered through the sheath, weight-based unfractionated heparin was administered intravenously. Standard Judkins catheters were used for selective coronary angiography and left ventriculography. Catheter exchanges were performed over an exchange length guidewire.  PROCEDURAL FINDINGS Hemodynamics: AO 132/59 LV 140/17  Coronary angiography: Coronary dominance: right  Left mainstem: The left main is patent. There is no obstructive disease. The vessel  divides into the LAD, intermediate branch, and left circumflex.  Left anterior descending (LAD): The LAD is patent to the apex. The proximal LAD has diffuse irregularities. At the first septal perforator there is 30% stenosis. The mid and distal LAD are widely patent and the vessel wraps around the LV apex. The first diagonal is medium in caliber with a 95% focal stenosis.  Left circumflex (LCx): The left circumflex courses down the AV groove and is small in caliber. There is an intermediate branch that is totally occluded proximally just after the left main divides. This is a large branch with 2 major subbranches that fill from left to left collaterals and retrograde fill back to the proximal vessel.  Right coronary artery (RCA): This is a dominant vessel. The vessel is smooth throughout its course. The PDA, PLA, and acute marginal branches are all patent.  Left ventriculography: There is mild hypokinesis of the mid inferior wall. The LVEF is 60%.  PCI Note: Following the diagnostic procedure, the decision was made to proceed with PCI of the diagonal. I suspected that this would be technically feasible, but felt the totally occluded intermediate branch would be difficult to open. The patient was loaded with Plavix 600 mg. Weight-based bivalirudin was given for anticoagulation. Once a therapeutic ACT was achieved, a 6 [place] XB 4 cm guide catheter was inserted. A cougar coronary guidewire was used to cross the lesion. The lesion was predilated with a 2.0 mm balloon. The lesion was then stented with a 2.25 x 12 mm Resolute Integrity DES. The stent was postdilated with a 2.5 mm noncompliant balloon. Following PCI, there was 0% residual stenosis and TIMI-3 flow. Final angiography confirmed an excellent result. The guide catheter was then repositioned and the wire pulled back. I made a brief attempt with the cougar workhorse wire to cross the total occlusion in the intermediate branch. I then attempted  with a feeler XT wire. Both of these wires were unsuccessful. I was able to advance them into the the beak of the occlusion, but could not cross. The patient tolerated  the procedure well. There were no immediate procedural complications. A TR band was used for radial hemostasis. The patient was transferred to the post catheterization recovery area for further monitoring.  PCI Data: Vessel - diagonal/Segment - diagonal 1 Percent Stenosis (pre) 90 TIMI-flow 3 Stent 2.25x12 mm Resolute DES Percent Stenosis (post) 0 TIMI-flow (post) 3  Estimated Blood Loss: minimal  Final Conclusions:  1. 2 vessel CAD with severe diagonal stenosis and total occlusion of the ramus intermedius 2. Widely patent LAD and RCA 3. Preserved LV systolic function 4. Successful PCI of the diagonal with a DES platform 5. Unable to cross total ramus occlusion   Recommendations:  DAPT With ASA and plavix x 12 months. Initiate medical therapy. If angina persists would refer for CTO-PCI. Films reviewed with Dr Irish Lack.  [name] MD, [facility] 08/12/2014, 3:48 PM   Assessment/Plan: 1. Chest pain - s/p cath with PCI - overall doing well but does endorse chest pain - not exertional but similar to prior syndrome - will add low dose Imdur. To hold on racquetball for the next month - if symptoms resolve, he may go play - if symptoms do not resolve - he needs to be back in touch with [place] to consider PCI.   2. HTN - BP better at home. No change for now.   3. HLD - On statin - monitored by PCP  4. Ongoing tobacco abuse - Smoking rare cigar.   5. Sarcoid  6. Fibrosis - currently with no complaint of dyspnea.  7. Bifascicular block - no syncope noted.   Current medicines are reviewed with the patient today.  The patient does not have concerns regarding medicines other than what has been noted above.  The following changes have been made:  See above.  Labs/ tests ordered today include:    Orders Placed This  Encounter  Procedures  . EKG 12-Lead     Disposition:   FU with Dr. Burt Knack in 3 months.   Patient is agreeable to this plan and will call if any problems develop in the interim.   Signed: Burtis Junes, RN, ANP-C 12/09/2014 8:20 AM  [facility] [address] [place], [place]  [address] Phone: [phone] Fax: [phone]

## 2014-12-09 NOTE — Patient Instructions (Addendum)
We will be checking the following labs today - NONE   Medication Instructions:    Continue with your current medicines but  I am adding Imdur 30 mg a day - this is a long acting nitroglycerin to increase blood flow to your heart and prevent chest pain  Stop Viagra    Testing/Procedures To Be Arranged:  N/A  Follow-Up:   See Dr. Percival Spanish in 3 months. However, if over the next month your chest pain continues - you need to call me so we can decide if further intervention is needed.      Other Special Instructions:   May start playing raquetball in one month if your chest pain is improved.   Call the [place] office at [phone] if you have any questions, problems or concerns.

## 2014-12-11 ENCOUNTER — Encounter (HOSPITAL_COMMUNITY): Payer: Medicare Other

## 2014-12-14 ENCOUNTER — Encounter (HOSPITAL_COMMUNITY): Payer: Medicare Other

## 2014-12-16 ENCOUNTER — Encounter (HOSPITAL_COMMUNITY): Payer: Medicare Other

## 2014-12-18 ENCOUNTER — Encounter (HOSPITAL_COMMUNITY): Payer: Medicare Other

## 2014-12-21 ENCOUNTER — Encounter (HOSPITAL_COMMUNITY): Payer: Medicare Other

## 2015-01-04 ENCOUNTER — Telehealth: Payer: Self-pay | Admitting: Nurse Practitioner

## 2015-01-04 NOTE — Telephone Encounter (Signed)
New message    Patient calling regarding new medication. Patient kept a  track of his chest pain for Cecille Rubin.   Medication is not working.

## 2015-01-04 NOTE — Telephone Encounter (Signed)
Follow Up        Pt calling stating that he sent Danielle a fax and wants to make sure she received it. Please call back and advise.

## 2015-01-04 NOTE — Telephone Encounter (Signed)
Pt called still having more frequent chest pain, denies dizziness,chills, fever,  but does have some sob.  Sometimes pain radiates down pt's left arm and also has burning in chest. Stated imdur is not working.  Stated Tera Helper stated to keep diary and pt is faxing over diary today.  Pt has not tried nitro.  Stated if it continually gets worse with no relief need to go to ER.  Stated Cecille Rubin is on vacation and won't be back until next week but stated verbal understanding.  Will send to Tera Helper to advise.

## 2015-01-05 NOTE — Telephone Encounter (Signed)
S/w pt very indecisive, wants to stop imdur, pt has 5 tablets left, I advised against it.  Pt stated should really be opened again for a cath. Really think this is what pt wants. Stated if they just opened pt up again pain would go away.  Wanted to call in Ranexa ( 500 mg) bid and take it maybe for two days before seeing Cecille Rubin but after imdur was done stated will discuss with Kathrene Alu, decided to bring pt in on Wednesday with Truitt Merle, NP.

## 2015-01-05 NOTE — Telephone Encounter (Signed)
He could try Ranexa.  If symptoms somewhat better on Imdur >> increase Imdur to 60 mg QD.  If symptoms no different >> continue same dose of Imdur and start Ranexa 500 mg Twice daily.  If symptoms seem to be getting worse, he can go ahead and start the Ranexa.  Plus, he should have an appointment this week.  Richardson Dopp, PA-C   01/05/2015 4:47 PM

## 2015-01-06 NOTE — Telephone Encounter (Signed)
He needs an appointment with Dr. Irish Lack or Dr. Martinique to discuss proceeding on  PCI of his total occlusion.   I can see him but I am going to refer him on to Dr. Irish Lack or [place]ique for an intervention.

## 2015-01-06 NOTE — Telephone Encounter (Signed)
Sent scheduling a message about patient needing an appointment with Dr. Irish Lack or Dr. Martinique.

## 2015-01-11 NOTE — Telephone Encounter (Signed)
It looks like an appt has been scheduled with Kathrene Alu for 01/13/15

## 2015-01-13 ENCOUNTER — Ambulatory Visit: Payer: Medicare Other | Admitting: Nurse Practitioner

## 2015-01-13 ENCOUNTER — Telehealth: Payer: Self-pay | Admitting: *Deleted

## 2015-01-13 NOTE — Telephone Encounter (Signed)
Spoke with pt 01/12/15 and cancelled appt with Truitt Merle, NP for 01/13/15. Pt needs to be seen for CTO by Dr. Irish Lack or Dr. Martinique per telephone encounter on 7/25. Informed pt that I would call him back with an appt with Dr. Irish Lack. Pt verbalized understanding and was in agreement with this plan.

## 2015-01-18 ENCOUNTER — Telehealth: Payer: Self-pay | Admitting: Interventional Cardiology

## 2015-01-18 NOTE — Telephone Encounter (Signed)
Pt called stating that he had some episodes of CP, SOB and sweating on Friday and Saturday. No vitals available. Pt did not seek medical attention at that time. Pt states that he took one Nitro and CP was relieved. Pt states that one of the episodes was brought on by stress. Pt denies any symptoms/issues on Sunday or this morning. Pt cancelled trip for this upcoming week because of these episodes. Spoke with Truitt Merle, NP and she said pt needs to be seen for CTO by Dr. Irish Lack or Dr. [place] soon. Will discuss with Dr. Irish Lack to see when pt can be brought in for appt.

## 2015-01-18 NOTE — Telephone Encounter (Signed)
Left message to call back  

## 2015-01-18 NOTE — Telephone Encounter (Signed)
OK to add to my schedule

## 2015-01-18 NOTE — Telephone Encounter (Signed)
New message      Talk to Mylo.  He thinks he may need to be seen sooner.  He has an "episode" over the weekend.

## 2015-01-19 NOTE — Telephone Encounter (Signed)
Follow up ° ° °Pt returning your call °

## 2015-01-19 NOTE — Telephone Encounter (Signed)
Spoke with pt and scheduled him 8/10 at 9:45A with Dr. Irish Lack.

## 2015-01-20 ENCOUNTER — Ambulatory Visit (INDEPENDENT_AMBULATORY_CARE_PROVIDER_SITE_OTHER): Payer: Medicare Other | Admitting: Interventional Cardiology

## 2015-01-20 ENCOUNTER — Encounter: Payer: Self-pay | Admitting: Interventional Cardiology

## 2015-01-20 VITALS — BP 128/78 | HR 62 | Ht 72.0 in | Wt 232.8 lb

## 2015-01-20 DIAGNOSIS — I25119 Atherosclerotic heart disease of native coronary artery with unspecified angina pectoris: Secondary | ICD-10-CM | POA: Diagnosis not present

## 2015-01-20 DIAGNOSIS — I10 Essential (primary) hypertension: Secondary | ICD-10-CM

## 2015-01-20 DIAGNOSIS — I209 Angina pectoris, unspecified: Secondary | ICD-10-CM

## 2015-01-20 MED ORDER — ISOSORBIDE MONONITRATE ER 60 MG PO TB24
60.0000 mg | ORAL_TABLET | Freq: Every day | ORAL | Status: DC
Start: 2015-01-20 — End: 2015-01-27

## 2015-01-20 NOTE — Progress Notes (Signed)
Patient ID: Luis [name], male   DOB: Feb 28, 1941, 74 y.o.   MRN: [id number]     Cardiology Office Note   Date:  01/20/2015   ID:  Luis [name], DOB 1940/07/15, MRN [id number]  PCP:  [name], MD    No chief complaint on file. CAD, CTO ramus   Wt Readings from Last 3 Encounters:  01/20/15 232 lb 12.8 oz (105.597 kg)  12/09/14 233 lb (105.688 kg)  09/10/14 240 lb 15.4 oz (109.3 kg)       History of Present Illness: Luis [name] is a 74 y.o. male  Who had a diagonal stent placed about 5 months ago.  He had a CTO of the Ramus vessel which could not be opened with wire escalation.  He felt well for the first few months after the stet.  More recently, he is having more DOE ad some chest pain with exertion.  It is not every time but "more often than not."  He has been more irritable acording to his wife.  He has used NTG if the chest pain lasts for 4-5 minutes and one NTG tab gives some relief.    Prior to the stent, he had a burning feeling in his chest.  THe sx that he has now are different at times than what he had at that time. In the past few days, he has had some burning return.  He has had no change with Imdur.     Past Medical History  Diagnosis Date  . Hypertension   . Hyperlipidemia   . Sarcoidosis   . Cataract   . Skin cancer     left shoulder.and mid chest  . History of adenomatous polyp of colon 05/2005    8 mm adenoma  . Elevated lipase   . Diverticulosis 05/2005  . Gallstones   . CAD (coronary artery disease)     a. 08/2014 Cath/PCI: LM nl, LAD 30p, D1 95 (2.25x12 Resolute Integrity DES), LCX small, RI 100 (attempted PCI) - branches fill via L->L collats, RCA dominant, nl, RPDA/PLA nl, EF 60^.    Past Surgical History  Procedure Laterality Date  . Tonsillectomy    . Lung surgery      partial removal right lung  . Colonoscopy w/ polypectomy  06/08/2005 and 10/18/2010    8 mm adenoma 2006, none 2012. Diverticulosis and internal hemorrhoids.  . Eye  surgery Right     melanom  . Cataract extraction Bilateral   . Lymph node biopsy      mid chest  . Prostate surgery      radiation seed implant  . Corneal transplant Bilateral   . Finger surgery Right     middle finger  . Left heart catheterization with coronary angiogram N/A 08/12/2014    Procedure: LEFT HEART CATHETERIZATION WITH CORONARY ANGIOGRAM;  Surgeon: Blane Ohara, MD;  Location: [facility] CATH LAB;  Service: Cardiovascular;  Laterality: N/A;     Current Outpatient Prescriptions  Medication Sig Dispense Refill  . aspirin EC 81 MG tablet Take 81 mg by mouth every evening.    . clopidogrel (PLAVIX) 75 MG tablet Take 1 tablet (75 mg total) by mouth daily with breakfast. 90 tablet 1  . clotrimazole-betamethasone (LOTRISONE) cream Apply 1 application topically 2 (two) times daily as needed (rash).     . Ergocalciferol (VITAMIN D2) 2000 UNITS TABS Take 2,000 Units by mouth every evening.    . ezetimibe (ZETIA) 10 MG tablet Take 10 mg by mouth every evening.     [name]  fluocinonide ointment (LIDEX) 2.77 % Apply 1 application topically as needed (Rash).    [name] glucosamine-chondroitin 500-400 MG tablet Take 1 tablet by mouth every evening.     . isosorbide mononitrate (IMDUR) 30 MG 24 hr tablet Take 1 tablet (30 mg total) by mouth daily. 30 tablet 6  . metoprolol tartrate (LOPRESSOR) 25 MG tablet Take 0.5 tablets (12.5 mg total) by mouth 2 (two) times daily. 90 tablet 1  . Multiple Vitamin (MULTIVITAMIN WITH MINERALS) TABS tablet Take 1 tablet by mouth every evening.    . nitroGLYCERIN (NITROSTAT) 0.4 MG SL tablet Place 1 tablet (0.4 mg total) under the tongue every 5 (five) minutes as needed for chest pain. 25 tablet 3  . pravastatin (PRAVACHOL) 40 MG tablet Take 1 tablet (40 mg total) by mouth every evening. 90 tablet 3  . silodosin (RAPAFLO) 8 MG CAPS capsule Take 8 mg by mouth as needed (When he takes trips (hasn't used recently)).    . traMADol (ULTRAM) 50 MG tablet Take by mouth every 4  (four) hours.      No current facility-administered medications for this visit.    Allergies:   Crestor    Social History:  The patient  reports that he has been smoking Cigars.  He has never used smokeless tobacco. He reports that he drinks about 0.6 oz of alcohol per week. He reports that he does not use illicit drugs.   Family History:  The patient's family history includes Coronary artery disease (age of onset: 9) in his sister; Heart disease (age of onset: 60) in his father. There is no history of Colon cancer, Throat cancer, Pancreatic cancer, or Prostate cancer.    ROS:  Please see the history of present illness.   Otherwise, review of systems are positive for recurrent chest pain.   All other systems are reviewed and negative.    PHYSICAL EXAM: VS:  BP 128/78 mmHg  Pulse 62  Ht 6' (1.829 m)  Wt 232 lb 12.8 oz (105.597 kg)  BMI 31.57 kg/m2  SpO2 97% , BMI Body mass index is 31.57 kg/(m^2). GEN: Well nourished, well developed, in no acute distress HEENT: normal Neck: no JVD, carotid bruits, or masses Cardiac: RRR; no murmurs, rubs, or gallops,no edema  Respiratory:  clear to auscultation bilaterally, normal work of breathing GI: soft, nontender, nondistended, + BS MS: no deformity or atrophy Skin: warm and dry, no rash Neuro:  Strength and sensation are intact Psych: euthymic mood, full affect     Recent Labs: 08/10/2014: Pro B Natriuretic peptide (BNP) 60.0 08/13/2014: BUN 13; Creatinine, Ser 0.95; Hemoglobin 13.8; Platelets 112*; Potassium 4.2; Sodium 137 11/10/2014: ALT 34   Lipid Panel    Component Value Date/Time   CHOL 161 11/10/2014 0754   TRIG 108.0 11/10/2014 0754   HDL 44.70 11/10/2014 0754   CHOLHDL 4 11/10/2014 0754   VLDL 21.6 11/10/2014 0754   LDLCALC 95 11/10/2014 0754     Other studies Reviewed: Additional studies/ records that were reviewed today with results demonstrating: cath films reviewed..   ASSESSMENT AND PLAN:  1. CAD: Recurrent  angina. This could be from his chronic total occlusion. There is a chance that he could've had an issue with his diagonal stent. Will increase isosorbide to 60 mg daily. If this does not help, would consider switching him door to Ranexa. If that does not help, would then plan for cardiac cath. Due to his slow heart rate, metoprolol cannot be increased anymore. 2. We discussed  the risks and benefits of CTO PCI specifically. He is willing to try medical therapy. If all options are exhausted, he is willing to take the risks of CTO PCI. This lesion was already attempted once with wire escalation and not crossed. 3.   Continue aggressive secondary prevention.  He is trying to lose weight. He was able to get off of his ARB.  He will call back to let us know if his angina has improved with the medication changes. Emotional stress also seems to give him chest pain as well. Hopefully can minimize this.  Current medicines are reviewed at length with the patient today.  The patient concerns regarding his medicines were addressed.  The following changes have been made:  increased imdur  Labs/ tests ordered today include:  No orders of the defined types were placed in this encounter.    Recommend 150 minutes/week of aerobic exercise Low fat, low carb, high fiber diet recommended  Disposition:   FU Based on his symptoms   Teresita Madura., MD  01/20/2015 10:35 AM    [facility] [address], [place], [place]  [address] Phone: [phone]; Fax: [phone]

## 2015-01-20 NOTE — Patient Instructions (Signed)
Medication Instructions:  Increase Imdur to 60 mg daily  Labwork: None  Testing/Procedures: None  Follow-Up: Your physician recommends that you schedule a follow-up appointment in: To be determined   Any Other Special Instructions Will Be Listed Below (If Applicable). Please call the office in 1 week at [phone] to let us know how you are doing.

## 2015-01-26 ENCOUNTER — Telehealth: Payer: Self-pay | Admitting: Interventional Cardiology

## 2015-01-26 NOTE — Telephone Encounter (Signed)
Informed pt of new order for Ranexa 500mg  BID. Samples placed at the front. Pt verbalized understanding and states he will be by this afternoon to pick up samples.

## 2015-01-26 NOTE — Telephone Encounter (Signed)
Spoke with pt and he states that the Imdur is not working. Pt states that the pain is now occuring 2-3 times a day but is not severe. Pt states that he does check his BP at home and it typically runs 125-135/60-70, HR 70's. Pt states that Dr. Irish Lack had mentioned trying another medication if Imdur did not work. Will forward to Dr. Irish Lack for review and advisement.

## 2015-01-26 NOTE — Telephone Encounter (Signed)
New message     Pt states new medication is not working If pt does not answer at work, please call cell phone

## 2015-01-26 NOTE — Telephone Encounter (Signed)
Can try Ranexa 500 mg BID.

## 2015-01-27 ENCOUNTER — Other Ambulatory Visit: Payer: Self-pay | Admitting: *Deleted

## 2015-01-27 MED ORDER — RANOLAZINE ER 500 MG PO TB12: 500.0000 mg | ORAL_TABLET | Freq: Two times a day (BID) | ORAL | Status: DC

## 2015-02-03 ENCOUNTER — Telehealth: Payer: Self-pay | Admitting: Interventional Cardiology

## 2015-02-03 DIAGNOSIS — Z01812 Encounter for preprocedural laboratory examination: Secondary | ICD-10-CM

## 2015-02-03 NOTE — Telephone Encounter (Signed)
New message     Pt calling to give you an update on the changes that have been made

## 2015-02-03 NOTE — Telephone Encounter (Signed)
Pt called stating that the Ranexa has not helped with his pain. Pt states that CP is still occuring 2-3 times a day. Pt states that he would like to stop the Ranexa and see is sx's worsen or stay the same. Pt states that Dr. Irish Lack had pt down on CTO schedule for Sept in the event that medications didn't work. Will forward to Dr. Irish Lack for review and advisement.

## 2015-02-04 NOTE — Telephone Encounter (Signed)
Would put him on the schedule officially for the CTO day in September.

## 2015-02-04 NOTE — Telephone Encounter (Signed)
Spoke with Dr. Irish Lack and he said ok for pt to stop Ranexa. Spoke with pt and informed him that Dr. Irish Lack said ok to stop Ranexa. Informed pt that I would call and get him added to CTO schedule for Sept and would call with instructions. Pt verbalized understanding and was appreciative for call.

## 2015-02-05 ENCOUNTER — Encounter: Payer: Self-pay | Admitting: *Deleted

## 2015-02-05 NOTE — Telephone Encounter (Signed)
Spoke with pt and provided information about his CTO. Went over instructions with pt. Scheduled labs for 9/7. Pt verbalized understanding and was in agreement with this plan.

## 2015-02-09 ENCOUNTER — Other Ambulatory Visit: Payer: Self-pay | Admitting: Cardiology

## 2015-02-16 ENCOUNTER — Telehealth: Payer: Self-pay | Admitting: Interventional Cardiology

## 2015-02-16 MED ORDER — PRAVASTATIN SODIUM 40 MG PO TABS: 40.0000 mg | ORAL_TABLET | Freq: Every evening | ORAL | Status: DC

## 2015-02-16 NOTE — Telephone Encounter (Signed)
New message    Pt needs prescription for medication (unable to understand the prescription name) Pt was also given samples at last appt; pt wanting to know if he can return the samples

## 2015-02-16 NOTE — Telephone Encounter (Signed)
Spoke with pt and he requested a refill of his Pravastatin be sent in to [facility]. Pt also stated that he wanted to return the last two sample packs of Ranexa that he had so someone else could use them. Informed pt it was fine to return them but they would be destroyed because we couldn't give medication to another pt. Pt states that he will dispose of medications then.

## 2015-02-17 ENCOUNTER — Other Ambulatory Visit (INDEPENDENT_AMBULATORY_CARE_PROVIDER_SITE_OTHER): Payer: Medicare Other

## 2015-02-17 DIAGNOSIS — Z01812 Encounter for preprocedural laboratory examination: Secondary | ICD-10-CM

## 2015-02-17 DIAGNOSIS — I10 Essential (primary) hypertension: Secondary | ICD-10-CM

## 2015-02-17 DIAGNOSIS — Z5181 Encounter for therapeutic drug level monitoring: Secondary | ICD-10-CM | POA: Diagnosis not present

## 2015-02-17 DIAGNOSIS — E785 Hyperlipidemia, unspecified: Secondary | ICD-10-CM | POA: Diagnosis not present

## 2015-02-17 LAB — BASIC METABOLIC PANEL
BUN: 16 mg/dL (ref 6–23)
CHLORIDE: 102 meq/L (ref 96–112)
CO2: 27 mEq/L (ref 19–32)
CREATININE: 0.99 mg/dL (ref 0.40–1.50)
Calcium: 9 mg/dL (ref 8.4–10.5)
GFR: 78.57 mL/min (ref >60.00)
Glucose, Bld: 111 mg/dL — ABNORMAL HIGH (ref 70–99)
Potassium: 4.6 mEq/L (ref 3.5–5.1)
Sodium: 136 mEq/L (ref 135–145)

## 2015-02-17 LAB — CBC
HEMATOCRIT: 44.2 % (ref 39.0–52.0)
Hemoglobin: 15.1 g/dL (ref 13.0–17.0)
MCHC: 34.1 g/dL (ref 30.0–36.0)
MCV: 93 fl (ref 78.0–100.0)
Platelets: 130 10*3/uL — ABNORMAL LOW (ref 150.0–400.0)
RBC: 4.75 Mil/uL (ref 4.22–5.81)
RDW: 14.3 % (ref 11.5–15.5)
WBC: 5.7 10*3/uL (ref 4.0–10.5)

## 2015-02-17 LAB — PROTIME-INR
INR: 0.9 ratio (ref 0.8–1.0)
Prothrombin Time: 10.2 s (ref 9.6–13.1)

## 2015-02-23 ENCOUNTER — Telehealth: Payer: Self-pay | Admitting: Cardiology

## 2015-02-23 NOTE — Telephone Encounter (Signed)
Returned call to [facility] CPT code for CTO scheduled 02/24/15 [id number].

## 2015-02-23 NOTE — Telephone Encounter (Signed)
Caller from pre services needing CPT code.  Can be reached at [phone] ex [phone]

## 2015-02-23 NOTE — Telephone Encounter (Signed)
She needs a CPT code for the pt's procedure tomorrow

## 2015-02-24 ENCOUNTER — Encounter (HOSPITAL_COMMUNITY)
Admission: RE | Disposition: A | Discharge status: Home / Self Care | Payer: Self-pay | Source: Ambulatory Visit | Attending: Cardiology

## 2015-02-24 ENCOUNTER — Ambulatory Visit (HOSPITAL_COMMUNITY)
Admission: RE | Admit: 2015-02-24 | Discharge: 2015-02-25 | Disposition: A | Discharge status: Home / Self Care | Payer: Medicare Other | Source: Ambulatory Visit | Attending: Cardiology | Admitting: Cardiology

## 2015-02-24 ENCOUNTER — Encounter (HOSPITAL_COMMUNITY): Payer: Self-pay | Admitting: General Practice

## 2015-02-24 DIAGNOSIS — I10 Essential (primary) hypertension: Secondary | ICD-10-CM | POA: Diagnosis present

## 2015-02-24 DIAGNOSIS — I2582 Chronic total occlusion of coronary artery: Secondary | ICD-10-CM | POA: Insufficient documentation

## 2015-02-24 DIAGNOSIS — I25119 Atherosclerotic heart disease of native coronary artery with unspecified angina pectoris: Secondary | ICD-10-CM | POA: Diagnosis not present

## 2015-02-24 DIAGNOSIS — R079 Chest pain, unspecified: Secondary | ICD-10-CM | POA: Diagnosis present

## 2015-02-24 DIAGNOSIS — F17210 Nicotine dependence, cigarettes, uncomplicated: Secondary | ICD-10-CM | POA: Diagnosis not present

## 2015-02-24 DIAGNOSIS — Z85828 Personal history of other malignant neoplasm of skin: Secondary | ICD-10-CM | POA: Insufficient documentation

## 2015-02-24 DIAGNOSIS — D869 Sarcoidosis, unspecified: Secondary | ICD-10-CM | POA: Diagnosis not present

## 2015-02-24 DIAGNOSIS — I209 Angina pectoris, unspecified: Secondary | ICD-10-CM | POA: Diagnosis present

## 2015-02-24 DIAGNOSIS — J8410 Pulmonary fibrosis, unspecified: Secondary | ICD-10-CM | POA: Diagnosis not present

## 2015-02-24 DIAGNOSIS — Z8546 Personal history of malignant neoplasm of prostate: Secondary | ICD-10-CM | POA: Diagnosis not present

## 2015-02-24 DIAGNOSIS — I2510 Atherosclerotic heart disease of native coronary artery without angina pectoris: Secondary | ICD-10-CM | POA: Diagnosis present

## 2015-02-24 DIAGNOSIS — Z7982 Long term (current) use of aspirin: Secondary | ICD-10-CM | POA: Insufficient documentation

## 2015-02-24 DIAGNOSIS — E785 Hyperlipidemia, unspecified: Secondary | ICD-10-CM | POA: Diagnosis not present

## 2015-02-24 DIAGNOSIS — Z7902 Long term (current) use of antithrombotics/antiplatelets: Secondary | ICD-10-CM | POA: Diagnosis not present

## 2015-02-24 DIAGNOSIS — Z955 Presence of coronary angioplasty implant and graft: Secondary | ICD-10-CM

## 2015-02-24 HISTORY — DX: Unspecified chronic bronchitis: J42

## 2015-02-24 HISTORY — DX: Unspecified osteoarthritis, unspecified site: M19.90

## 2015-02-24 HISTORY — DX: Endothelial corneal dystrophy: H18.51

## 2015-02-24 HISTORY — DX: Endothelial corneal dystrophy, unspecified eye: H18.519

## 2015-02-24 HISTORY — DX: Basal cell carcinoma of skin, unspecified: C44.91

## 2015-02-24 HISTORY — DX: Personal history of other medical treatment: Z92.89

## 2015-02-24 HISTORY — DX: Malignant neoplasm of prostate: C61

## 2015-02-24 HISTORY — PX: CARDIAC CATHETERIZATION: SHX172

## 2015-02-24 LAB — POCT ACTIVATED CLOTTING TIME
ACTIVATED CLOTTING TIME: 276 s
ACTIVATED CLOTTING TIME: 159 s
Activated Clotting Time: 282 seconds
Activated Clotting Time: 294 seconds
Activated Clotting Time: 429 seconds
Activated Clotting Time: 214 seconds
Activated Clotting Time: 251 seconds

## 2015-02-24 SURGERY — CORONARY CTO INTERVENTION
Anesthesia: LOCAL

## 2015-02-24 MED ORDER — ONDANSETRON HCL 4 MG/2ML IJ SOLN: 4.0000 mg | Freq: Four times a day (QID) | INTRAMUSCULAR | Status: DC | PRN

## 2015-02-24 MED ORDER — HEPARIN (PORCINE) IN NACL 2-0.9 UNIT/ML-% IJ SOLN
INTRAMUSCULAR | Status: AC
  Filled 2015-02-24: qty 1500

## 2015-02-24 MED ORDER — ADULT MULTIVITAMIN W/MINERALS CH
1.0000 | ORAL_TABLET | Freq: Every evening | ORAL | Status: DC
  Filled 2015-02-24: qty 1

## 2015-02-24 MED ORDER — HEPARIN SODIUM (PORCINE) 1000 UNIT/ML IJ SOLN
INTRAMUSCULAR | Status: AC
  Filled 2015-02-24: qty 1

## 2015-02-24 MED ORDER — SODIUM CHLORIDE 0.9 % IV SOLN: 250.0000 mL | INTRAVENOUS | Status: DC | PRN

## 2015-02-24 MED ORDER — NITROGLYCERIN 0.4 MG SL SUBL: 0.4000 mg | SUBLINGUAL_TABLET | SUBLINGUAL | Status: DC | PRN

## 2015-02-24 MED ORDER — MIDAZOLAM HCL 2 MG/2ML IJ SOLN
INTRAMUSCULAR | Status: AC
  Filled 2015-02-24: qty 4

## 2015-02-24 MED ORDER — SODIUM CHLORIDE 0.9 % IJ SOLN
3.0000 mL | INTRAMUSCULAR | Status: DC | PRN
Start: 2015-02-24 — End: 2015-02-24

## 2015-02-24 MED ORDER — ACETAMINOPHEN 325 MG PO TABS: 650.0000 mg | ORAL_TABLET | ORAL | Status: DC | PRN

## 2015-02-24 MED ORDER — LIDOCAINE HCL (PF) 1 % IJ SOLN
INTRAMUSCULAR | Status: DC | PRN
  Administered 2015-02-24: 09:00:00

## 2015-02-24 MED ORDER — HYDROMORPHONE HCL 1 MG/ML IJ SOLN
0.5000 mg | INTRAMUSCULAR | Status: DC | PRN
  Administered 2015-02-24 (×3): 1 mg via INTRAVENOUS
  Administered 2015-02-25: 05:00:00 2 mg via INTRAVENOUS
  Filled 2015-02-24 (×2): qty 2
  Filled 2015-02-24: qty 1

## 2015-02-24 MED ORDER — IOHEXOL 350 MG/ML SOLN
INTRAVENOUS | Status: DC | PRN
  Administered 2015-02-24: 100 mL via INTRAVENOUS
  Administered 2015-02-24: 125 mL via INTRAVENOUS
  Administered 2015-02-24: 50 mL via INTRAVENOUS

## 2015-02-24 MED ORDER — SODIUM CHLORIDE 0.9 % IJ SOLN: 3.0000 mL | INTRAMUSCULAR | Status: DC | PRN

## 2015-02-24 MED ORDER — TRAMADOL HCL 50 MG PO TABS
50.0000 mg | ORAL_TABLET | Freq: Three times a day (TID) | ORAL | Status: DC | PRN
  Administered 2015-02-24 – 2015-02-25 (×2): 100 mg via ORAL
  Filled 2015-02-24 (×3): qty 2

## 2015-02-24 MED ORDER — NITROGLYCERIN 1 MG/10 ML FOR IR/CATH LAB
INTRA_ARTERIAL | Status: DC | PRN
  Administered 2015-02-24 (×2): 200 ug

## 2015-02-24 MED ORDER — FENTANYL CITRATE (PF) 100 MCG/2ML IJ SOLN
INTRAMUSCULAR | Status: AC
  Filled 2015-02-24: qty 4

## 2015-02-24 MED ORDER — TAMSULOSIN HCL 0.4 MG PO CAPS: 0.4000 mg | ORAL_CAPSULE | Freq: Every day | ORAL | Status: DC

## 2015-02-24 MED ORDER — LIDOCAINE HCL (PF) 1 % IJ SOLN
INTRAMUSCULAR | Status: DC | PRN
  Administered 2015-02-24: 30 mL

## 2015-02-24 MED ORDER — VITAMIN D2 50 MCG (2000 UT) PO TABS: 2000.0000 [IU] | ORAL_TABLET | Freq: Every evening | ORAL | Status: DC

## 2015-02-24 MED ORDER — NITROGLYCERIN 1 MG/10 ML FOR IR/CATH LAB
INTRA_ARTERIAL | Status: AC
  Filled 2015-02-24: qty 10

## 2015-02-24 MED ORDER — SODIUM CHLORIDE 0.9 % IJ SOLN: 3.0000 mL | Freq: Two times a day (BID) | INTRAMUSCULAR | Status: DC

## 2015-02-24 MED ORDER — LIDOCAINE HCL (PF) 1 % IJ SOLN
INTRAMUSCULAR | Status: AC
  Filled 2015-02-24: qty 60

## 2015-02-24 MED ORDER — CLOPIDOGREL BISULFATE 75 MG PO TABS
75.0000 mg | ORAL_TABLET | Freq: Once | ORAL | Status: DC
Start: 2015-02-24 — End: 2015-02-24

## 2015-02-24 MED ORDER — METOPROLOL TARTRATE 25 MG PO TABS
25.0000 mg | ORAL_TABLET | Freq: Two times a day (BID) | ORAL | Status: DC
  Administered 2015-02-24 – 2015-02-25 (×2): 25 mg via ORAL
  Filled 2015-02-24 (×3): qty 1

## 2015-02-24 MED ORDER — SODIUM CHLORIDE 0.9 % WEIGHT BASED INFUSION: 1.0000 mL/kg/h | INTRAVENOUS | Status: DC

## 2015-02-24 MED ORDER — ASPIRIN 81 MG PO CHEW
CHEWABLE_TABLET | ORAL | Status: AC
  Filled 2015-02-24: qty 1

## 2015-02-24 MED ORDER — OMEGA-3-ACID ETHYL ESTERS 1 G PO CAPS
1.0000 g | ORAL_CAPSULE | Freq: Every day | ORAL | Status: DC
  Administered 2015-02-24 – 2015-02-25 (×2): 1 g via ORAL
  Filled 2015-02-24 (×2): qty 1

## 2015-02-24 MED ORDER — PRAVASTATIN SODIUM 40 MG PO TABS
40.0000 mg | ORAL_TABLET | Freq: Every evening | ORAL | Status: DC
  Administered 2015-02-24: 40 mg via ORAL
  Filled 2015-02-24: qty 1

## 2015-02-24 MED ORDER — ASPIRIN 81 MG PO CHEW
81.0000 mg | CHEWABLE_TABLET | ORAL | Status: AC
  Administered 2015-02-24: 81 mg via ORAL

## 2015-02-24 MED ORDER — ASPIRIN EC 81 MG PO TBEC
81.0000 mg | DELAYED_RELEASE_TABLET | Freq: Every evening | ORAL | Status: DC
  Filled 2015-02-24: qty 1

## 2015-02-24 MED ORDER — SODIUM CHLORIDE 0.9 % IV SOLN
INTRAVENOUS | Status: DC | PRN
  Administered 2015-02-24: 307 mL via INTRAVENOUS

## 2015-02-24 MED ORDER — HEPARIN SODIUM (PORCINE) 1000 UNIT/ML IJ SOLN
INTRAMUSCULAR | Status: DC | PRN
  Administered 2015-02-24: 2000 [IU] via INTRAVENOUS
  Administered 2015-02-24: 3000 [IU] via INTRAVENOUS
  Administered 2015-02-24: 8000 [IU] via INTRAVENOUS
  Administered 2015-02-24: 2000 [IU] via INTRAVENOUS

## 2015-02-24 MED ORDER — FENTANYL CITRATE (PF) 100 MCG/2ML IJ SOLN
INTRAMUSCULAR | Status: DC | PRN
  Administered 2015-02-24: 50 ug via INTRAVENOUS
  Administered 2015-02-24 (×6): 25 ug via INTRAVENOUS

## 2015-02-24 MED ORDER — EZETIMIBE 10 MG PO TABS
10.0000 mg | ORAL_TABLET | Freq: Every evening | ORAL | Status: DC
  Administered 2015-02-24: 10 mg via ORAL
  Filled 2015-02-24: qty 1

## 2015-02-24 MED ORDER — SODIUM CHLORIDE 0.9 % WEIGHT BASED INFUSION
3.0000 mL/kg/h | INTRAVENOUS | Status: AC
Start: 2015-02-24 — End: 2015-02-24

## 2015-02-24 MED ORDER — CLOPIDOGREL BISULFATE 75 MG PO TABS
75.0000 mg | ORAL_TABLET | Freq: Every day | ORAL | Status: DC
  Administered 2015-02-25: 75 mg via ORAL
  Filled 2015-02-24: qty 1

## 2015-02-24 MED ORDER — SODIUM CHLORIDE 0.9 % WEIGHT BASED INFUSION
3.0000 mL/kg/h | INTRAVENOUS | Status: DC
  Administered 2015-02-24: 3 mL/kg/h via INTRAVENOUS

## 2015-02-24 MED ORDER — MIDAZOLAM HCL 2 MG/2ML IJ SOLN
INTRAMUSCULAR | Status: DC | PRN
  Administered 2015-02-24 (×2): 1 mg via INTRAVENOUS
  Administered 2015-02-24: 2 mg via INTRAVENOUS
  Administered 2015-02-24 (×4): 1 mg via INTRAVENOUS

## 2015-02-24 MED ORDER — VERAPAMIL HCL 2.5 MG/ML IV SOLN
INTRAVENOUS | Status: AC
  Filled 2015-02-24: qty 4

## 2015-02-24 MED ORDER — VITAMIN D 1000 UNITS PO TABS
2000.0000 [IU] | ORAL_TABLET | Freq: Every evening | ORAL | Status: DC
  Filled 2015-02-24: qty 2

## 2015-02-24 SURGICAL SUPPLY — 37 items
BALLN EMERGE MR 2.0X20 (BALLOONS) ×3
BALLN EMERGE MR 2.5X20 (BALLOONS) ×3
BALLN EMERGE MR 3.0X15 (BALLOONS) ×3
BALLN ~~LOC~~ EMERGE MR 3.25X20 (BALLOONS) ×3
BALLOON EMERGE MR 2.0X20 (BALLOONS) IMPLANT
BALLOON EMERGE MR 2.5X20 (BALLOONS) IMPLANT
BALLOON EMERGE MR 3.0X15 (BALLOONS) IMPLANT
BALLOON ~~LOC~~ EMERGE MR 3.25X20 (BALLOONS) IMPLANT
CATH MACH1 8F VL3.5 (CATHETERS) ×1 IMPLANT
CATH MACH1 8F VL4 (CATHETERS) ×1 IMPLANT
CATH ROTALINK PLUS 1.25MM (BURR) ×1 IMPLANT
CATH TURNPIKE 135CM (CATHETERS) ×1 IMPLANT
DEVICE WIRE ANGIOSEAL 6FR (Vascular Products) ×1 IMPLANT
ELECT DEFIB PAD ADLT CADENCE (PAD) ×1 IMPLANT
FEM STOP ARCH (HEMOSTASIS) ×1
GUIDE CATH RUNWAY 6FR FR4 (CATHETERS) ×1 IMPLANT
KIT ENCORE 26 ADVANTAGE (KITS) ×3 IMPLANT
KIT HEART LEFT (KITS) ×4 IMPLANT
LUBRICANT ROTAGLIDE 20CC VIAL (MISCELLANEOUS) ×1 IMPLANT
PACK CARDIAC CATHETERIZATION (CUSTOM PROCEDURE TRAY) ×3 IMPLANT
SHEATH BRITE TIP 8FR 35CM (SHEATH) ×1 IMPLANT
SHEATH PINNACLE 6F 10CM (SHEATH) ×1 IMPLANT
SHEATH PINNACLE 8F 10CM (SHEATH) ×1 IMPLANT
STENT SYNERGY DES 2.5X38 (Permanent Stent) ×1 IMPLANT
STENT SYNERGY DES 3X12 (Permanent Stent) ×1 IMPLANT
SYSTEM COMPRESSION FEMOSTOP (HEMOSTASIS) IMPLANT
TRANSDUCER W/STOPCOCK (MISCELLANEOUS) ×4 IMPLANT
TUBING ART PRESS 72  MALE/FEM (TUBING) ×1
TUBING ART PRESS 72 MALE/FEM (TUBING) IMPLANT
TUBING CIL FLEX 10 FLL-RA (TUBING) ×4 IMPLANT
VALVE GUARDIAN II ~~LOC~~ HEMO (MISCELLANEOUS) ×1 IMPLANT
WIRE ASAHI CONFIANZA 300CM (WIRE) ×1 IMPLANT
WIRE ASAHI MIRACLEBROS-3 180CM (WIRE) ×1 IMPLANT
WIRE ASAHI MIRACLEBROS-6 180CM (WIRE) ×1 IMPLANT
WIRE ASAHI PROWATER 180CM (WIRE) ×1 IMPLANT
WIRE EMERALD 3MM-J .035X150CM (WIRE) ×1 IMPLANT
WIRE ROTA FLOPPY .009X325CM (WIRE) ×1 IMPLANT

## 2015-02-24 NOTE — Progress Notes (Signed)
Patient arrived from cath lab with arterial sheath in right groin; small hematoma noted at site.  Left groin with Femstop in place; extensive bruising and large hematoma (marked).  Right groin sheath pulled at 1440.  Hematoma at right groin had increased in size (marked), and pressure was applied in conjunction with the sheath pull hold.  Applied pressure for 10 minutes again at right groin site for hematoma fullness.  Right groin has remained stable since then.  Femstop was removed from the left groin at time of right groin hold (1440).  Left groin remained stable until 1915, at which time additional fullness noted and pressure held for ten minutes.  Femstop reapplied to left groin at that time with moderate pressure from the belt; no air pressure applied to device.  Vital signs have remained stable and pain treated throughout the afternoon.   Night RN will continue to closely monitor.

## 2015-02-24 NOTE — Progress Notes (Signed)
Post Procedure it was noted after manual compression that a hematoma was forming. Dr. Irish Lack was notified and assessed left groin. Per Dr. Irish Lack a Femstop was place on groin. Groin is stable, Distal pulses present.

## 2015-02-24 NOTE — Progress Notes (Signed)
Site area: right groin  Site Prior to Removal:  Level 2  Pressure Applied For 20 MINUTES    Minutes Beginning at 1440  Manual:   Yes.    Patient Status During Pull:  stable  Post Pull Groin Site:  Level 2  Post Pull Instructions Given:  Yes.    Post Pull Pulses Present:  Yes.    Dressing Applied:  Yes.    Comments:

## 2015-02-24 NOTE — H&P (Signed)
Luis [name] is an 74 y.o. male.   Primary Cardiologist: PMD: Chief Complaint: CP HPI:  Who had a diagonal stent placed about 6 months ago. He had a CTO of the Ramus vessel which could not be opened with wire escalation. He felt well for the first few months after the stet. More recently, he is having more DOE ad some chest pain with exertion. It is not every time but "more often than not." He has been more irritable acording to his wife.  He has used NTG if the chest pain lasts for 4-5 minutes and one NTG tab gives some relief.   Prior to the stent, he had a burning feeling in his chest. THe sx that he has now are different at times than what he had at that time. In the past few days, he has had some burning return. He has had no change with Imdur or Ranexa.  Past Medical History  Diagnosis Date  . Hypertension   . Hyperlipidemia   . Sarcoidosis   . Cataract   . Skin cancer     left shoulder.and mid chest  . History of adenomatous polyp of colon 05/2005    8 mm adenoma  . Elevated lipase   . Diverticulosis 05/2005  . Gallstones   . CAD (coronary artery disease)     a. 08/2014 Cath/PCI: LM nl, LAD 30p, D1 95 (2.25x12 Resolute Integrity DES), LCX small, RI 100 (attempted PCI) - branches fill via L->L collats, RCA dominant, nl, RPDA/PLA nl, EF 60^.    Past Surgical History  Procedure Laterality Date  . Tonsillectomy    . Lung surgery      partial removal right lung  . Colonoscopy w/ polypectomy  06/08/2005 and 10/18/2010    8 mm adenoma 2006, none 2012. Diverticulosis and internal hemorrhoids.  . Eye surgery Right     melanom  . Cataract extraction Bilateral   . Lymph node biopsy      mid chest  . Prostate surgery      radiation seed implant  . Corneal transplant Bilateral   . Finger surgery Right     middle finger  . Left heart catheterization with coronary angiogram N/A 08/12/2014    Procedure: LEFT HEART CATHETERIZATION WITH CORONARY ANGIOGRAM;  Surgeon: Blane Ohara, MD;  Location: [facility] CATH LAB;  Service: Cardiovascular;  Laterality: N/A;    Family History  Problem Relation Age of Onset  . Heart disease Father 61    Died from "hardening of the arteries" age 46  . Coronary artery disease Sister 8  . Colon cancer Neg Hx   . Throat cancer Neg Hx   . Pancreatic cancer Neg Hx   . Prostate cancer Neg Hx    Social History:  reports that he has been smoking Cigars.  He has never used smokeless tobacco. He reports that he drinks about 0.6 oz of alcohol per week. He reports that he does not use illicit drugs.  Allergies:  Allergies  Allergen Reactions  . Crestor [Rosuvastatin] Other (See Comments)    Muscle aches with Crestor. Has tolerated multiple other statins just fine.    Medications Prior to Admission  Medication Sig Dispense Refill  . aspirin EC 81 MG tablet Take 81 mg by mouth every evening.    . clopidogrel (PLAVIX) 75 MG tablet Take 1 tablet by mouth  daily with breakfast 90 tablet 0  . clotrimazole-betamethasone (LOTRISONE) cream Apply 1 application topically 2 (two) times daily as needed (  rash).     . Ergocalciferol (VITAMIN D2) 2000 UNITS TABS Take 2,000 Units by mouth every evening.    . ezetimibe (ZETIA) 10 MG tablet Take 10 mg by mouth every evening.     . fluocinonide ointment (LIDEX) 3.29 % Apply 1 application topically as needed (Rash).    [name] MEGARED OMEGA-3 KRILL OIL PO Take 1 capsule by mouth daily.    . metoprolol tartrate (LOPRESSOR) 25 MG tablet Take one-half tablet by  mouth two times daily 90 tablet 0  . Multiple Vitamin (MULTIVITAMIN WITH MINERALS) TABS tablet Take 1 tablet by mouth every evening.    . nitroGLYCERIN (NITROSTAT) 0.4 MG SL tablet Place 1 tablet (0.4 mg total) under the tongue every 5 (five) minutes as needed for chest pain. 25 tablet 3  . Omega-3 Fatty Acids (FISH OIL) 500 MG CAPS Take 1 capsule by mouth daily.    . pravastatin (PRAVACHOL) 40 MG tablet Take 1 tablet (40 mg total) by mouth every evening. 90  tablet 3  . sildenafil (REVATIO) 20 MG tablet Take 60-120 mg by mouth as needed (erectile dysfunction).    . traMADol (ULTRAM) 50 MG tablet Take 50-100 mg by mouth See admin instructions. Take 2 tablets (100 mg) every morning, Take 1 tablet (50 mg ) every evening. Take 1 tablet (50 mg) in the afternoon if needed for pain relief.    . silodosin (RAPAFLO) 8 MG CAPS capsule Take 8 mg by mouth as needed (When he takes trips (hasn't used recently)).      No results found for this or any previous visit (from the past 48 hour(s)). No results found.  ROS: CP as noted above. No bleeing issues.  OBJECTIVE:   Vitals:   Filed Vitals:   02/24/15 0639  BP: 141/79  Pulse: 63  Temp: 97.8 F (36.6 C)  TempSrc: Oral  Resp: 18  Height: 6' (1.829 m)  Weight: 226 lb (102.513 kg)  SpO2: 97%   I&O's:  No intake or output data in the 24 hours ending 02/24/15 0904 TELEMETRY: Reviewed telemetry pt in NSR:     PHYSICAL EXAM General: Well developed, well nourished, in no acute distress Head:   Normal cephalic and atramatic  Lungs:   Clear bilaterally to auscultation. Heart:   HRRR S1 S2  No JVD.   Abdomen: abdomen soft and non-tender Msk:  Back normal,  Normal strength and tone for age. Extremities:   No edema.   Neuro: Alert and oriented. Psych:  Normal affect, responds appropriately  LABS: Basic Metabolic Panel: No results for input(s): NA, K, CL, CO2, GLUCOSE, BUN, CREATININE, CALCIUM, MG, PHOS in the last 72 hours. Liver Function Tests: No results for input(s): AST, ALT, ALKPHOS, BILITOT, PROT, ALBUMIN in the last 72 hours. No results for input(s): LIPASE, AMYLASE in the last 72 hours. CBC: No results for input(s): WBC, NEUTROABS, HGB, HCT, MCV, PLT in the last 72 hours. Cardiac Enzymes: No results for input(s): CKTOTAL, CKMB, CKMBINDEX, TROPONINI in the last 72 hours. BNP: Invalid input(s): POCBNP D-Dimer: No results for input(s): DDIMER in the last 72 hours. Hemoglobin A1C: No  results for input(s): HGBA1C in the last 72 hours. Fasting Lipid Panel: No results for input(s): CHOL, HDL, LDLCALC, TRIG, CHOLHDL, LDLDIRECT in the last 72 hours. Thyroid Function Tests: No results for input(s): TSH, T4TOTAL, T3FREE, THYROIDAB in the last 72 hours.  Invalid input(s): FREET3 Anemia Panel: No results for input(s): VITAMINB12, FOLATE, FERRITIN, TIBC, IRON, RETICCTPCT in the last 72 hours. Coag  Panel:   Lab Results  Component Value Date   INR 0.9 02/17/2015   INR 1.0 08/10/2014   INR 0.97 08/23/2009       Assessment/Plan 1) CTO of Ramus with sx refractory to medical therapy.  THe risks and benefits of CTO PCI were explained to the patient.   All questions were answered, spoke to wife and paitent. Patient on DAPT.  Plan overnight stay as well.  Alix Stowers S. 02/24/2015, 9:04 AM

## 2015-02-25 ENCOUNTER — Encounter (HOSPITAL_COMMUNITY): Payer: Self-pay | Admitting: Interventional Cardiology

## 2015-02-25 DIAGNOSIS — I10 Essential (primary) hypertension: Secondary | ICD-10-CM | POA: Diagnosis not present

## 2015-02-25 DIAGNOSIS — J8410 Pulmonary fibrosis, unspecified: Secondary | ICD-10-CM | POA: Diagnosis not present

## 2015-02-25 DIAGNOSIS — I209 Angina pectoris, unspecified: Secondary | ICD-10-CM | POA: Diagnosis not present

## 2015-02-25 DIAGNOSIS — I25119 Atherosclerotic heart disease of native coronary artery with unspecified angina pectoris: Secondary | ICD-10-CM | POA: Diagnosis not present

## 2015-02-25 DIAGNOSIS — E785 Hyperlipidemia, unspecified: Secondary | ICD-10-CM | POA: Diagnosis not present

## 2015-02-25 LAB — BASIC METABOLIC PANEL
Anion gap: 4 — ABNORMAL LOW (ref 5–15)
BUN: 18 mg/dL (ref 6–20)
CALCIUM: 8.2 mg/dL — AB (ref 8.9–10.3)
CHLORIDE: 99 mmol/L — AB (ref 101–111)
CO2: 28 mmol/L (ref 22–32)
CREATININE: 1.01 mg/dL (ref 0.61–1.24)
GFR calc non Af Amer: >60 mL/min (ref >60)
Glucose, Bld: 109 mg/dL — ABNORMAL HIGH (ref 65–99)
Potassium: 4.8 mmol/L (ref 3.5–5.1)
SODIUM: 131 mmol/L — AB (ref 135–145)

## 2015-02-25 LAB — CBC
HCT: 36.1 % — ABNORMAL LOW (ref 39.0–52.0)
Hemoglobin: 12.3 g/dL — ABNORMAL LOW (ref 13.0–17.0)
MCH: 31.8 pg (ref 26.0–34.0)
MCHC: 34.1 g/dL (ref 30.0–36.0)
MCV: 93.3 fL (ref 78.0–100.0)
PLATELETS: 123 10*3/uL — AB (ref 150–400)
RBC: 3.87 MIL/uL — AB (ref 4.22–5.81)
RDW: 14.7 % (ref 11.5–15.5)
WBC: 9.4 10*3/uL (ref 4.0–10.5)

## 2015-02-25 MED ORDER — NITROGLYCERIN 0.4 MG SL SUBL: 0.4000 mg | SUBLINGUAL_TABLET | SUBLINGUAL | Status: DC | PRN

## 2015-02-25 MED ORDER — CLOPIDOGREL BISULFATE 75 MG PO TABS
ORAL_TABLET | ORAL | Status: DC
Start: 2015-02-25 — End: 2015-03-16

## 2015-02-25 MED ORDER — VERAPAMIL HCL 2.5 MG/ML IV SOLN
INTRA_ARTERIAL | Status: DC | PRN
  Administered 2015-02-24: 11:00:00 via INTRACORONARY

## 2015-02-25 NOTE — Progress Notes (Signed)
Patient Name: Luis [name] Date of Encounter: 02/25/2015  Primary Cardiologist: Dr. Colen Darling. Irish Lack   Principal Problem:   Angina pectoris Active Problems:   CAD (coronary artery disease)   Essential hypertension   Hyperlipidemia   Pulmonary fibrosis   Sarcoidosis    SUBJECTIVE  Denies any CP or SOB last night. Some back pain laying on the bed  CURRENT MEDS . aspirin EC  81 mg Oral QPM  . cholecalciferol  2,000 Units Oral QPM  . clopidogrel  75 mg Oral Daily  . ezetimibe  10 mg Oral QPM  . metoprolol tartrate  25 mg Oral BID  . multivitamin with minerals  1 tablet Oral QPM  . omega-3 acid ethyl esters  1 g Oral Daily  . pravastatin  40 mg Oral QPM    OBJECTIVE  Filed Vitals:   02/24/15 1630 02/24/15 1800 02/24/15 1900 02/25/15 0435  BP: 151/80 146/62 138/58 118/62  Pulse:   79 67  Temp:   97.8 F (36.6 C) 97.8 F (36.6 C)  TempSrc:   Oral Oral  Resp: 15 19 19 18   Height:      Weight:    223 lb 15.8 oz (101.6 kg)  SpO2: 93% 96% 97% 98%    Intake/Output Summary (Last 24 hours) at 02/25/15 0734 Last data filed at 02/24/15 1832  Gross per 24 hour  Intake 1444.38 ml  Output      0 ml  Net 1444.38 ml   Filed Weights   02/24/15 0639 02/25/15 0435  Weight: 226 lb (102.513 kg) 223 lb 15.8 oz (101.6 kg)    PHYSICAL EXAM  General: Pleasant, NAD. Neuro: Alert and oriented X 3. Moves all extremities spontaneously. Psych: Normal affect. HEENT:  Normal  Neck: Supple without bruits or JVD. Lungs:  Resp regular and unlabored, CTA. Heart: RRR no s3, s4, or murmurs. L groin cath site dressing in place, diffusely bruised but no tenderness on palpation; right groin bruised Abdomen: Soft, non-tender, non-distended, BS + x 4.  Extremities: No clubbing, cyanosis or edema. DP/PT/Radials 2+ and equal bilaterally.  Accessory Clinical Findings  CBC  Recent Labs  02/25/15 0327  WBC 9.4  HGB 12.3*  HCT 36.1*  MCV 93.3  PLT 536*   Basic Metabolic  Panel  Recent Labs  02/25/15 0327  NA 131*  K 4.8  CL 99*  CO2 28  GLUCOSE 109*  BUN 18  CREATININE 1.01  CALCIUM 8.2*    TELE NSR without significant ventricular ectopy    ECG  NSR with RBBB   Radiology/Studies  No results found.  ASSESSMENT AND PLAN  1. CTO of ramus with sx refractory to medical therapy  - cath 02/24/2015 100% stenosis in Ramus treated with overlapping Synergy DES 2.5x38 mm and 3.0 x 53mm.   - continue ASA, plavix, metoprolol, pravastatin and zetia.  - stable for discharge today if walk ok with cardiac rehab, will give 1 week work note  2. CAD  - 08/2014 Cath/PCI: LM nl, LAD 30p, D1 95 (2.25x12 Resolute Integrity DES), LCX small, RI 100 (attempted PCI) - branches fill via L->L collats, RCA dominant, nl, RPDA/PLA nl, EF 60%.  3. HTN 4. HLD 5. Sarcoidosis  Signed, Almyra Deforest PA-C Pager: [phone]  I have examined the patient and reviewed assessment and plan and discussed with patient.  Agree with above as stated.  Bilateral groin bruising.  Palpable right femoral pulse.  2+ palpable DP pulses bilaterally.  No problems walking.  Asked him to  be careful to avoid any heavy lifting for the next week.  He agrees.  DAPT for at least a year and likely beyond.  Elleni Mozingo S.

## 2015-02-25 NOTE — Discharge Summary (Signed)
Discharge Summary   Patient ID: Luis [name],  MRN: [id number], DOB/AGE: 1941/04/26 74 y.o.  Admit date: 02/24/2015 Discharge date: 02/25/2015  Primary Care Provider: [facility] Primary Cardiologist: Dr. Percival [name]  Discharge Diagnoses Principal Problem:   Angina pectoris Active Problems:   CAD (coronary artery disease)   Essential hypertension   Hyperlipidemia   Pulmonary fibrosis   Sarcoidosis   Allergies Allergies  Allergen Reactions  . Crestor [Rosuvastatin] Other (See Comments)    Muscle aches with Crestor. Has tolerated multiple other statins just fine.    Procedures  Cardiac catheterization 02/24/2015 Conclusion     Ramus lesion, 100% stenosed. There is a 0% residual stenosis post intervention with overlapping Synergy drug-eluting stents. 2.5 x 38 mm and 3.0 x 12 mm.  Continue dual anti-platelet therapy for at least a year and likely longer given the length of this occlusion. He'll need aggressive secondary prevention as well. He'll be watched overnight.      Hospital Course  The patient is a 74 year old male with PMH of CAD, hypertension, hyperlipidemia, prostate cancer, and fibrosis due to sarcoid who presented for CTO of ramus procedure. He underwent cardiac catheterization in March 2016 which showed two-vessel CAD with severe diagonal stenosis and total occlusion of ramus intermedius. Unable to cross total ramus occlusion at the time. He underwent successful PCI of the diagonal stenosis with DES. He was placed on aspirin and Plavix. He was referred to Dr. Irish [name] in August due to increasing shortness of breath with exertion and the chest pain. After discussing various options with the patient, he agreed to undergo CTO PCI of the ramus.  Patient presented to for scheduled cardiac catheterization on 02/24/2015, the 100% stenosis in the ramus was treated with 2 overlapping DES (Synergy DES 2.4x38 mm and 3.0 x 91mm). He was seen on the following morning, at  which time she denies significant chest discomfort or shortness of breath. He does have significant left groin ecchymosis. He has been advised to avoid lifting anything greater than 5 pounds for at least one week. Emphasis has been placed on compliance with DAPT. He has ambulated with cardiac rehabilitation without significant discomfort. He is deemed stable for discharge from cardiology perspective.   Discharge Vitals Blood pressure 125/77, pulse 74, temperature 97.8 F (36.6 C), temperature source Oral, resp. rate 18, height 6' (1.829 m), weight 223 lb 15.8 oz (101.6 kg), SpO2 98 %.  Filed Weights   02/24/15 0639 02/25/15 0435  Weight: 226 lb (102.513 kg) 223 lb 15.8 oz (101.6 kg)    Labs  CBC  Recent Labs  02/25/15 0327  WBC 9.4  HGB 12.3*  HCT 36.1*  MCV 93.3  PLT 732*   Basic Metabolic Panel  Recent Labs  02/25/15 0327  NA 131*  K 4.8  CL 99*  CO2 28  GLUCOSE 109*  BUN 18  CREATININE 1.01  CALCIUM 8.2*   Disposition  Pt is being discharged home today in good condition.  Follow-up Plans & Appointments      Follow-up Information    Follow up with Luis Breeding, MD On 03/05/2015.   Specialty:  Cardiology   Why:  8:15am   Contact information:   [address] [place] [address] [phone]       Discharge Medications    Medication List    TAKE these medications        aspirin EC 81 MG tablet  Take 81 mg by mouth every evening.     clopidogrel 75  MG tablet  Commonly known as:  PLAVIX  Take 1 tablet by mouth  daily with breakfast     clotrimazole-betamethasone cream  Commonly known as:  LOTRISONE  Apply 1 application topically 2 (two) times daily as needed (rash).     ezetimibe 10 MG tablet  Commonly known as:  ZETIA  Take 10 mg by mouth every evening.     Fish Oil 500 MG Caps  Take 1 capsule by mouth daily.     fluocinonide ointment 0.05 %  Commonly known as:  LIDEX  Apply 1 application topically as needed (Rash).       MEGARED OMEGA-3 KRILL OIL PO  Take 1 capsule by mouth daily.     metoprolol tartrate 25 MG tablet  Commonly known as:  LOPRESSOR  Take one-half tablet by  mouth two times daily     multivitamin with minerals Tabs tablet  Take 1 tablet by mouth every evening.     nitroGLYCERIN 0.4 MG SL tablet  Commonly known as:  NITROSTAT  Place 1 tablet (0.4 mg total) under the tongue every 5 (five) minutes as needed for chest pain.     pravastatin 40 MG tablet  Commonly known as:  PRAVACHOL  Take 1 tablet (40 mg total) by mouth every evening.     sildenafil 20 MG tablet  Commonly known as:  REVATIO  Take 60-120 mg by mouth as needed (erectile dysfunction).     silodosin 8 MG Caps capsule  Commonly known as:  RAPAFLO  Take 8 mg by mouth as needed (When he takes trips (hasn't used recently)).     traMADol 50 MG tablet  Commonly known as:  ULTRAM  Take 50-100 mg by mouth See admin instructions. Take 2 tablets (100 mg) every morning, Take 1 tablet (50 mg ) every evening. Take 1 tablet (50 mg) in the afternoon if needed for pain relief.     Vitamin D2 2000 UNITS Tabs  Take 2,000 Units by mouth every evening.        Duration of Discharge Encounter   Greater than 30 minutes including physician time.  Luis Corrigan PA-C Pager: [phone] 02/25/2015, 9:09 AM    I have examined the patient and reviewed assessment and plan and discussed with patient. Agree with above as stated. Bilateral groin bruising. Palpable right femoral pulse. 2+ palpable DP pulses bilaterally. No problems walking. Asked him to be careful to avoid any heavy lifting for the next week. He agrees. DAPT for at least a year and likely beyond.  Luis [name].

## 2015-02-25 NOTE — Discharge Instructions (Signed)
Angina Pectoris Angina pectoris is extreme discomfort in your chest, neck, or arm. Your doctor may call it just angina. It is caused by a lack of oxygen to your heart wall. It may feel like tightness or heavy pressure. It may feel like a crushing or squeezing pain. Some people say it feels like gas. It may go down your shoulders, back, and arms. Some people have symptoms other than pain. These include:  Tiredness.  Shortness of breath.  Cold sweats.  Feeling sick to your stomach (nausea). There are four types of angina:  Stable angina. This type often lasts the same amount of time each time it happens. Activity, stress, or excitement can bring it on. It often gets better after taking a medicine called nitroglycerin. This goes under your tongue.  Unstable angina. This type can happen when you are not active or even during sleep. It can suddenly get worse or happen more often. It may not get better after taking the special medicine. It can last up to 30 minutes.  Microvascular angina. This type is more common in women. It may be more severe or last longer than other types.  Prinzmetal angina. This type often happens when you are not active or in the early morning hours. HOME CARE   Only take medicines as told by your doctor.  Stay active or exercise more as told by your doctor.  Limit very hard activity as told by your doctor.  Limit heavy lifting as told by your doctor.  Keep a healthy weight.  Learn about and eat foods that are healthy for your heart.  Do not use any tobacco such as cigarettes, chewing tobacco, or e-cigarettes. GET HELP RIGHT AWAY IF:   You have chest, neck, deep shoulder, or arm pain or discomfort that lasts more than a few minutes.  You have chest, neck, deep shoulder, or arm pain or discomfort that goes away and comes back over and over again.  You have heavy sweating that seems to happen for no reason.  You have shortness of breath or trouble  breathing.  Your angina does not get better after a few minutes of rest.  Your angina does not get better after you take nitroglycerin medicine. These can all be symptoms of a heart attack. Get help right away. Call your local emergency service (911 in [place] Do not  drive yourself to the hospital. Do not  wait to for your symptoms to go away. MAKE SURE YOU:   Understand these instructions.  Will watch your condition.  Will get help right away if you are not doing well or get worse. Document Released: 11/15/2007 Document Revised: 06/03/2013 Document Reviewed: 09/30/2013 [facility] Patient Information 2015 [place], [place]. This information is not intended to replace advice given to you by your health care provider. Make sure you discuss any questions you have with your health care provider.  No driving for 24 hours. No lifting over 5 lbs for 1 week. No sexual activity for 1 week. Keep procedure site clean & dry. If you notice increased pain, swelling, bleeding or pus, call/return!  You may shower, but no soaking baths/hot tubs/pools for 1 week.   Please do not take sublingual nitroglycerine within 24 hours of taking sildenafil

## 2015-02-25 NOTE — Progress Notes (Addendum)
CARDIAC REHAB PHASE I   PRE:  Rate/Rhythm: 74 SR    BP: sitting 125/77    SaO2:   MODE:  Ambulation: 600 ft   POST:  Rate/Rhythm: 85 SR    BP: sitting 153/64     SaO2:   Tolerated well, no c/o. Ed completed/reviewed. Pt somewhat difficult to educate due to his own perceived understanding but wife was receptive. Will refer to [facility] in [place] although pt just did in the spring and feels confident from what he learned.  [phone]   Josephina Shih Winfield CES, ACSM 02/25/2015 8:45 AM

## 2015-03-01 ENCOUNTER — Telehealth: Payer: Self-pay | Admitting: Interventional Cardiology

## 2015-03-01 NOTE — Telephone Encounter (Signed)
Discussed with patient that DC summary stated for patient to refrain from lifting over 5 lbs for at least one week. Encouraged him to go slowly when advancing weight and pace himself. Patient verbalized understanding and agreement. He changed appt with Dr. Percival Spanish on 9/23 to an appt with Dr. Irish Lack on 10/18. Will route question to Dr. Irish Lack as to when/if he would like pt to be seen sooner s/p surgery.

## 2015-03-01 NOTE — Telephone Encounter (Signed)
New Message    Pt wants to know when can he lift past 5lbs?   For the follow up after surgery when should he come in?

## 2015-03-01 NOTE — Telephone Encounter (Signed)
WOuld like him to have a groin check with extender in the next week.

## 2015-03-02 ENCOUNTER — Encounter: Payer: Self-pay | Admitting: Physician Assistant

## 2015-03-02 ENCOUNTER — Other Ambulatory Visit: Payer: Self-pay | Admitting: Physician Assistant

## 2015-03-02 ENCOUNTER — Ambulatory Visit (INDEPENDENT_AMBULATORY_CARE_PROVIDER_SITE_OTHER): Payer: Medicare Other | Admitting: Physician Assistant

## 2015-03-02 VITALS — BP 126/72 | HR 67 | Ht 72.0 in | Wt 232.1 lb

## 2015-03-02 DIAGNOSIS — Z9861 Coronary angioplasty status: Secondary | ICD-10-CM

## 2015-03-02 DIAGNOSIS — R739 Hyperglycemia, unspecified: Secondary | ICD-10-CM

## 2015-03-02 DIAGNOSIS — I453 Trifascicular block: Secondary | ICD-10-CM | POA: Insufficient documentation

## 2015-03-02 DIAGNOSIS — T148XXA Other injury of unspecified body region, initial encounter: Secondary | ICD-10-CM

## 2015-03-02 DIAGNOSIS — E785 Hyperlipidemia, unspecified: Secondary | ICD-10-CM

## 2015-03-02 DIAGNOSIS — Z9889 Other specified postprocedural states: Secondary | ICD-10-CM

## 2015-03-02 DIAGNOSIS — I2510 Atherosclerotic heart disease of native coronary artery without angina pectoris: Secondary | ICD-10-CM

## 2015-03-02 DIAGNOSIS — I10 Essential (primary) hypertension: Secondary | ICD-10-CM | POA: Diagnosis not present

## 2015-03-02 DIAGNOSIS — D696 Thrombocytopenia, unspecified: Secondary | ICD-10-CM

## 2015-03-02 DIAGNOSIS — E871 Hypo-osmolality and hyponatremia: Secondary | ICD-10-CM

## 2015-03-02 LAB — COMPREHENSIVE METABOLIC PANEL
ALBUMIN: 4.1 g/dL (ref 3.5–5.2)
ALK PHOS: 72 U/L (ref 39–117)
ALT: 41 U/L (ref 0–53)
AST: 34 U/L (ref 0–37)
BUN: 17 mg/dL (ref 6–23)
CO2: 30 mEq/L (ref 19–32)
Calcium: 9.3 mg/dL (ref 8.4–10.5)
Chloride: 101 mEq/L (ref 96–112)
Creatinine, Ser: 0.96 mg/dL (ref 0.40–1.50)
GFR: 81.4 mL/min (ref >60.00)
Glucose, Bld: 86 mg/dL (ref 70–99)
POTASSIUM: 4.3 meq/L (ref 3.5–5.1)
Sodium: 137 mEq/L (ref 135–145)
TOTAL PROTEIN: 6.8 g/dL (ref 6.0–8.3)
Total Bilirubin: 1.2 mg/dL (ref 0.2–1.2)

## 2015-03-02 LAB — CBC WITH DIFFERENTIAL/PLATELET
Basophils Absolute: 0 10*3/uL (ref 0.0–0.1)
Basophils Relative: 0.4 % (ref 0.0–3.0)
EOS PCT: 3.7 % (ref 0.0–5.0)
Eosinophils Absolute: 0.2 10*3/uL (ref 0.0–0.7)
HCT: 38.7 % — ABNORMAL LOW (ref 39.0–52.0)
HEMOGLOBIN: 13 g/dL (ref 13.0–17.0)
LYMPHS ABS: 0.6 10*3/uL — AB (ref 0.7–4.0)
Lymphocytes Relative: 10.3 % — ABNORMAL LOW (ref 12.0–46.0)
MCHC: 33.7 g/dL (ref 30.0–36.0)
MCV: 92.7 fl (ref 78.0–100.0)
MONOS PCT: 10.5 % (ref 3.0–12.0)
Monocytes Absolute: 0.6 10*3/uL (ref 0.1–1.0)
NEUTROS PCT: 75.1 % (ref 43.0–77.0)
Neutro Abs: 4.6 10*3/uL (ref 1.4–7.7)
Platelets: 159 10*3/uL (ref 150.0–400.0)
RBC: 4.17 Mil/uL — AB (ref 4.22–5.81)
RDW: 15 % (ref 11.5–15.5)
WBC: 6.1 10*3/uL (ref 4.0–10.5)

## 2015-03-02 MED ORDER — ROSUVASTATIN CALCIUM 20 MG PO TABS: 20.0000 mg | ORAL_TABLET | Freq: Every day | ORAL | Status: DC

## 2015-03-02 NOTE — Patient Instructions (Addendum)
Medication Instructions:  Your physician has recommended you make the following change in your medication:  1.  STOP THE PRAVASTATIN. 2.  START THE CRESTOR 20 mg tablet taking 1 tablet daily. 3.  It is ok to continue on the Metoprolol.  Labwork:TODAY:  CBC W/DIFF                 CMET  04-13-2015:  LIPID                     LFT'S    Testing/Procedures: We have ordered a Lower Extremity Arterial Duplex (Bilateral) to be done ASAP).  Follow-Up: Your physician wants you keep your scheduled follow-up appointment with Dr. Irish Lack 03-30-15 @ 8:15 a.m.

## 2015-03-02 NOTE — Progress Notes (Signed)
Cardiology Office Note Date:  03/02/2015  Patient ID:  Luis [name], DOB [birth year], MRN [id number] PCP:  [name], MD  Cardiologist:  Hochrein/[facility]anasi   Chief Complaint: follow-up cath  History of Present Illness: Luis [name] is a 74 y.o. male with history of CAD (s/p DES to diagonal 08/2014, s/p recent CTO PCI with overlapping DES to ramus 02/2015), HTN, HLD, prostate CA, and pulmonary fibrosis due to sarcoidosis who presents for post-cath check. He has history of cath in 08/2014 showing 2 vessel CAD with severe diagonal stenosis and total occlusion of ramus intermedius. They were unable to cross total ramus occlusion at the time. He underwent successful PCI of the diagonal stenosis with DES. He was placed on aspirin and Plavix. He was referred to Dr. Irish Lack in August due to increasing shortness of breath with exertion and the chest pain. After discussing various options with the patient, he agreed to undergo CTO PCI of the ramus. He underwent this procedure on 02/24/15 - the 100% stenosis in the ramus was treated with 2 overlapping DES (Synergy DES 2.4x38 mm and 3.0 x 25mm). Post-procedure he did well except he did have significant left groin ecchymosis. Labwork did show anemia - Hgb went from 15.1 on 02/17/15 pre-cath to 12.3 post-cath on 02/25/15. Platelet count was 123 (but this appears to generally run low per prior labs). Sodium was low at 131 on post-cath BMET with glucose 109. Last LDL was 95 in 10/2014.  He feels great since discharge. No chest pain, SOB, palpitations, near-syncope or syncope. He is anxious to get back into normal physical activity and plans to resume playing racquetball eventually. He does report progression of ecchymosis bilaterally post-cath. He says it was in both legs post-cath but has spread slightly further. This happened in the first few days after cath but seems to have stabilized with slow change in bruising to yellow/brown. He had bilateral femoral cath  sticks.   Past Medical History  Diagnosis Date  . Hypertension   . Hyperlipidemia   . Sarcoidosis   . History of adenomatous polyp of colon 05/2005    8 mm adenoma  . Elevated lipase   . Diverticulosis 05/2005  . CAD (coronary artery disease)     a. 08/2014 Cath/PCI: LM nl, LAD 30p, D1 95 (2.25x12 Resolute Integrity DES), LCX small, RI 100 (attempted PCI) - branches fill via L->L collats, RCA dominant, nl, RPDA/PLA nl, EF 60^. b. 02/24/2015 PCI CTO of Ramus DES x2.  . Chronic bronchitis     hx  . History of blood transfusion     "related to some of my surgeries"  . Arthritis     "mid back; hands; knees" (02/24/2015)  . Basal cell carcinoma     left shoulder; mid chest; right eyelid (02/24/2015)  . Prostate cancer     S/P seed implant  . Fuchs' corneal dystrophy   . Thrombocytopenia     a. Noted on prior labs, unclear of what w/u done.  . Trifascicular block     Past Surgical History  Procedure Laterality Date  . Thorocotomy with lobectomy Right ~ 1991    partial removal right lung  . Colonoscopy w/ polypectomy  06/08/2005 and 10/18/2010    8 mm adenoma 2006, none 2012. Diverticulosis and internal hemorrhoids.  . Eye surgery    . Cataract extraction w/ intraocular lens  implant, bilateral Bilateral ~ 2011-2012  . Lymph node biopsy      mid chest  . Insertion prostate radiation seed  ~  2011  . Corneal transplant Bilateral ~ 2011-2012    "@ same time as cataract OR"  . Finger surgery Right 2014    "reattached middle finger"  . Left heart catheterization with coronary angiogram N/A 08/12/2014    Procedure: LEFT HEART CATHETERIZATION WITH CORONARY ANGIOGRAM;  Surgeon: Blane Ohara, MD;  Location: [facility] CATH LAB;  Service: Cardiovascular;  Laterality: N/A;  . Coronary angioplasty with stent placement  08/2014; 02/24/2015    "1 stent + 1 stent"  . Tonsillectomy  ~ 1950  . Laparoscopic cholecystectomy  2015  . Mohs surgery Right ~ 2011    "eyelid; for basal cell"  . Basal cell  carcinoma excision Left     shoulder  . Cardiac catheterization N/A 02/24/2015    Procedure: Coronary/Bypass Graft CTO Intervention;  Surgeon: Jettie Booze, MD;  Location: [place] CV LAB;  Service: Cardiovascular;  Laterality: N/A;  . Cardiac catheterization  02/24/2015    Procedure: Coronary/Graft Atherectomy;  Surgeon: Jettie Booze, MD;  Location: [place] CV LAB;  Service: Cardiovascular;;    Current Outpatient Prescriptions  Medication Sig Dispense Refill  . aspirin EC 81 MG tablet Take 81 mg by mouth every evening.    . clopidogrel (PLAVIX) 75 MG tablet Take 1 tablet by mouth  daily with breakfast 90 tablet 3  . clotrimazole-betamethasone (LOTRISONE) cream Apply 1 application topically 2 (two) times daily as needed (rash).     . Ergocalciferol (VITAMIN D2) 2000 UNITS TABS Take 2,000 Units by mouth every evening.    . ezetimibe (ZETIA) 10 MG tablet Take 10 mg by mouth every evening.     . fluocinonide ointment (LIDEX) 2.63 % Apply 1 application topically as needed (Rash).    . metoprolol tartrate (LOPRESSOR) 25 MG tablet Take one-half tablet by  mouth two times daily 90 tablet 0  . Multiple Vitamin (MULTIVITAMIN WITH MINERALS) TABS tablet Take 1 tablet by mouth every evening.    . nitroGLYCERIN (NITROSTAT) 0.4 MG SL tablet Place 1 tablet (0.4 mg total) under the tongue every 5 (five) minutes as needed for chest pain. 25 tablet 3  . Omega-3 Fatty Acids (FISH OIL) 500 MG CAPS Take 1 capsule by mouth daily.    . pravastatin (PRAVACHOL) 40 MG tablet Take 1 tablet (40 mg total) by mouth every evening. 90 tablet 3  . sildenafil (REVATIO) 20 MG tablet Take 60-120 mg by mouth as needed (erectile dysfunction).    . silodosin (RAPAFLO) 8 MG CAPS capsule Take 8 mg by mouth as needed (When he takes trips (hasn't used recently)).    . traMADol (ULTRAM) 50 MG tablet Take 50-100 mg by mouth See admin instructions. Take 2 tablets (100 mg) every morning, Take 1 tablet (50 mg ) every  evening. Take 1 tablet (50 mg) in the afternoon if needed for pain relief.     No current facility-administered medications for this visit.    Allergies:   Crestor   Social History:  The patient  reports that he has been smoking Cigars.  He has never used smokeless tobacco. He reports that he drinks about 1.2 oz of alcohol per week. He reports that he does not use illicit drugs.   Family History:  The patient's family history includes Coronary artery disease (age of onset: 79) in his sister; Heart disease (age of onset: 83) in his father. There is no history of Colon cancer, Throat cancer, Pancreatic cancer, or Prostate cancer.  ROS:  Please see the history of  present illness.  All other systems are reviewed and otherwise negative.   PHYSICAL EXAM:  VS:  BP 126/72 mmHg  Pulse 67  Ht 6' (1.829 m)  Wt 232 lb 1.9 oz (105.289 kg)  BMI 31.47 kg/m2 BMI: Body mass index is 31.47 kg/(m^2). Well nourished, well developed WM in no acute distress HEENT: normocephalic, atraumatic Neck: no JVD, carotid bruits or masses Cardiac:  normal S1, S2; RRR; no murmurs, rubs, or gallops Lungs:  clear to auscultation bilaterally, no wheezing, rhonchi or rales Abd: soft, nontender, no hepatomegaly, + BS MS: no deformity or atrophy Ext: no edema but positive for bilateral groin ecchymosis that is primarily centered around groin sites then extends slightly around flanks. There is slight discoloration of bruising that goes down medial thighs. The bruising is soft and appears to be in early stages of resolution with yellow/brownish discoloration around the peripheries. No bruits.  Skin: warm and dry, no rash Neuro:  moves all extremities spontaneously, no focal abnormalities noted, follows commands Psych: euthymic mood, full affect   EKG:  Done today shows NSR 67bpm, 1st degree AVB, RBBB, LAFB  Recent Labs: 08/10/2014: Pro B Natriuretic peptide (BNP) 60.0 11/10/2014: ALT 34 02/25/2015: BUN 18; Creatinine, Ser  1.01; Hemoglobin 12.3*; Platelets 123*; Potassium 4.8; Sodium 131*  11/10/2014: Cholesterol 161; HDL 44.70; LDL Cholesterol 95; Total CHOL/HDL Ratio 4; Triglycerides 108.0; VLDL 21.6   Estimated Creatinine Clearance: 81.7 mL/min (by C-G formula based on Cr of 1.01).   Wt Readings from Last 3 Encounters:  03/02/15 232 lb 1.9 oz (105.289 kg)  02/25/15 223 lb 15.8 oz (101.6 kg)  01/20/15 232 lb 12.8 oz (105.597 kg)     Other studies reviewed: Additional studies/records reviewed today include: summarized above  ASSESSMENT AND PLAN:  1. CAD s/p PCI - doing great from cardiac standpoint. However, he has continued groin ecchymosis. This appears to be in early stages of resolution but he did report some extension of bruising after discharge. He also had a drop in Hgb while inpatient. As a precaution I will check bilateral groin [place] to rule out pseudoaneurysm or fistula. I asked patient to refrain from heavy lifting or strenuous activity until we get this result. Continue DAPT. 2. Essential HTN - controlled. 3. Hyperlipidemia - ideally he should be on higher potency statin given his CAD. He reports h/o myalgias many years ago with Crestor as well as myalgias with Lipitor. He has tolerated higher dose Pravachol just fine. He also tolerated Vytorin well in the past as well. He wants to try Crestor again. Will d/c Pravachol and start Crestor 20mg  daily. Check CMET today. Check lipids/LFTs in 6 weeks. He will alert [place] if he has recurrent myalgias at which time I would consider reducing the dose. 4. Recent hyponatremia on labwork - recheck today since we are getting CMET. 5. Recent hyperglycemia on labwork - patient reports h/o elevated glucose in the past. This is followed by PCP. 6. Recent thrombocytopenia on labwork - f/u today. This does appear somewhat chronic. 7. Trifascicular block - reviewed EKG with Dr. Caryl Comes. At present time he does not see a need to stop the metoprolol since Mr. Baldus has been  asymptomatic and doing well. I discussed observation of symptoms of bradycardia with the patient.   Disposition: Will plan to keep f/u with Dr. Irish Lack as previously scheduled in 1 month to reassess for full resolution of ecchymosis.  Current medicines are reviewed at length with the patient today.  The patient did not have  any concerns regarding medicines.  Raechel Ache PA-C 03/02/2015 3:38 PM     [address] [place] [address] [phone] (office)  [phone] (fax)

## 2015-03-02 NOTE — Telephone Encounter (Signed)
Contacted patient regarding Dr. Irish Lack recommendation that patient be seen this week by extended provider to follow up s/p surgery. Pt scheduled to see Melina Copa, PA, today at 2:45 pm at [facility]. Pt agreed to plan.

## 2015-03-03 ENCOUNTER — Other Ambulatory Visit: Payer: Medicare Other | Admitting: Physician Assistant

## 2015-03-03 ENCOUNTER — Telehealth: Payer: Self-pay

## 2015-03-03 ENCOUNTER — Ambulatory Visit (HOSPITAL_COMMUNITY)
Admission: RE | Admit: 2015-03-03 | Discharge: 2015-03-03 | Disposition: A | Discharge status: Home / Self Care | Payer: Medicare Other | Source: Ambulatory Visit | Attending: Physician Assistant | Admitting: Physician Assistant

## 2015-03-03 DIAGNOSIS — R58 Hemorrhage, not elsewhere classified: Secondary | ICD-10-CM | POA: Diagnosis not present

## 2015-03-03 DIAGNOSIS — I10 Essential (primary) hypertension: Secondary | ICD-10-CM | POA: Insufficient documentation

## 2015-03-03 DIAGNOSIS — Z9889 Other specified postprocedural states: Secondary | ICD-10-CM | POA: Diagnosis not present

## 2015-03-03 DIAGNOSIS — R1909 Other intra-abdominal and pelvic swelling, mass and lump: Secondary | ICD-10-CM | POA: Diagnosis not present

## 2015-03-03 DIAGNOSIS — D869 Sarcoidosis, unspecified: Secondary | ICD-10-CM | POA: Diagnosis not present

## 2015-03-03 DIAGNOSIS — I2510 Atherosclerotic heart disease of native coronary artery without angina pectoris: Secondary | ICD-10-CM | POA: Diagnosis not present

## 2015-03-03 DIAGNOSIS — E785 Hyperlipidemia, unspecified: Secondary | ICD-10-CM | POA: Diagnosis not present

## 2015-03-03 DIAGNOSIS — T148XXA Other injury of unspecified body region, initial encounter: Secondary | ICD-10-CM

## 2015-03-03 DIAGNOSIS — R1030 Lower abdominal pain, unspecified: Secondary | ICD-10-CM | POA: Diagnosis not present

## 2015-03-03 NOTE — Telephone Encounter (Signed)
Patient has decided to hold the Zetia for now, until his next blood work results to see how the Crestor works by itself. Will update medication list.  Notes Recorded by Charlie Pitter, PA-C on 03/03/2015 at 1:28 PM It's up to him - we can either continue, or hold Zetia while we try the Crestor. Either is reasonable. The Zetia was likely in place to help bring down the LDL because the pravastatin was not strong enough to do so but I think Crestor has the potential to get him to goal. Melina Copa PA-C  Notes Recorded by Dalphine Handing, RN on 03/03/2015 at 1:04 PM Patient called back about lab results. Per Melina Copa PA, please let patient know CMET totally normal. CBC shows hemoglobin is stable (improved slightly) from last week. Patient wanted to know if he was suppose to continue his zetia after starting the crestor. Will forward to [employer]. Notes Recorded by Dalphine Handing, RN on 03/03/2015 at 12:41 PM Left message for patient to call back. Notes Recorded by Charlie Pitter, PA-C on 03/03/2015 at 8:01 AM Pls let patient know CMET totally normal. CBC shows hemoglobin is stable (improved slightly) from last week. Dayna Dunn PA-C

## 2015-03-05 ENCOUNTER — Ambulatory Visit: Payer: Medicare Other | Admitting: Cardiology

## 2015-03-10 ENCOUNTER — Telehealth: Payer: Self-pay | Admitting: Interventional Cardiology

## 2015-03-10 NOTE — Telephone Encounter (Signed)
Called patient back about test results. Called patient about ultra sound of groin results. Per Melina Copa PA, shows no evidence of vascular abnormality bilaterally. OK to return to normal activity as tolerated. Patient verbalized understanding.

## 2015-03-10 NOTE — Telephone Encounter (Signed)
New message     Patient calling for test results  & restriction after procedures

## 2015-03-15 ENCOUNTER — Telehealth: Payer: Self-pay | Admitting: Interventional Cardiology

## 2015-03-15 NOTE — Telephone Encounter (Signed)
Called patient about message below. Informed patient that he has no appointment with Radiology in our system and reminded him of his next appointment with Dr. Irish Lack. Patient verbalized understanding.

## 2015-03-15 NOTE — Telephone Encounter (Signed)
New Message   Pt states that he was told that he has a radiology appointment on 03/16/2015. Pt states that it was discussed with Dr. Irish Lack that the appt was not needed. Pt requests a call back to discuss.

## 2015-03-16 ENCOUNTER — Other Ambulatory Visit: Payer: Self-pay | Admitting: Cardiology

## 2015-03-24 ENCOUNTER — Other Ambulatory Visit: Payer: Self-pay

## 2015-03-30 ENCOUNTER — Ambulatory Visit (INDEPENDENT_AMBULATORY_CARE_PROVIDER_SITE_OTHER): Payer: Medicare Other | Admitting: Interventional Cardiology

## 2015-03-30 ENCOUNTER — Encounter: Payer: Self-pay | Admitting: Interventional Cardiology

## 2015-03-30 VITALS — BP 140/60 | HR 72 | Ht 72.0 in | Wt 235.0 lb

## 2015-03-30 DIAGNOSIS — I25119 Atherosclerotic heart disease of native coronary artery with unspecified angina pectoris: Secondary | ICD-10-CM

## 2015-03-30 DIAGNOSIS — R0609 Other forms of dyspnea: Secondary | ICD-10-CM | POA: Insufficient documentation

## 2015-03-30 DIAGNOSIS — E669 Obesity, unspecified: Secondary | ICD-10-CM | POA: Diagnosis not present

## 2015-03-30 DIAGNOSIS — E785 Hyperlipidemia, unspecified: Secondary | ICD-10-CM | POA: Diagnosis not present

## 2015-03-30 NOTE — Patient Instructions (Signed)
Medication Instructions:  Same-no changes  Labwork: None  Testing/Procedures: None  Follow-Up: Your physician wants you to follow-up in: 6 months. You will receive a reminder letter in the mail two months in advance. If you don't receive a letter, please call our office to schedule the follow-up appointment.      

## 2015-03-30 NOTE — Progress Notes (Signed)
Patient ID: Luis [name], male   DOB: 02-07-1941, 74 y.o.   MRN: [id number]     Cardiology Office Note   Date:  03/30/2015   ID:  [name], DOB [birth year], MRN [id number]  PCP:  [name], MD    No chief complaint on file. f/u CTO PCI   Wt Readings from Last 3 Encounters:  03/30/15 235 lb (106.595 kg)  03/02/15 232 lb 1.9 oz (105.289 kg)  02/25/15 223 lb 15.8 oz (101.6 kg)       History of Present Illness: Luis [name] is a 74 y.o. male  with history of CAD (s/p DES to diagonal 08/2014, s/p recent CTO PCI with overlapping DES to ramus 02/2015), HTN, HLD, prostate CA, and pulmonary fibrosis due to sarcoidosis. He has history of cath in 08/2014 showing 2 vessel CAD with severe diagonal stenosis and total occlusion of ramus intermedius. They were unable to cross total ramus occlusion at the time. He underwent successful PCI of the diagonal stenosis with DES. He was placed on aspirin and Plavix.  No relief with Ranexa or Imdur.  He had CTO PCI of the ramus due to increasing shortness of breath with exertion and the chest pain.  He underwent this procedure on 02/24/15 - the 100% stenosis in the ramus was treated with 2 overlapping DES (Synergy DES 2.4x38 mm and 3.0 x 58mm).    Rare chest pain.  Not as frequent as prior to the stents.  He has even resumed jogging at this time.  No CP with jogging.  Groin bruising has resolved.    He is planning a trip to the [employer] in 2017 where he does a lot of walking.    Past Medical History  Diagnosis Date  . Hypertension   . Hyperlipidemia   . Sarcoidosis ([place])   . History of adenomatous polyp of colon 05/2005    8 mm adenoma  . Elevated lipase   . Diverticulosis 05/2005  . CAD (coronary artery disease)     a. 08/2014 Cath/PCI: LM nl, LAD 30p, D1 95 (2.25x12 Resolute Integrity DES), LCX small, RI 100 (attempted PCI) - branches fill via L->L collats, RCA dominant, nl, RPDA/PLA nl, EF 60^. b. 02/24/2015 PCI CTO of Ramus DES x2.  .  Chronic bronchitis (HCC)     hx  . History of blood transfusion     "related to some of my surgeries"  . Arthritis     "mid back; hands; knees" (02/24/2015)  . Basal cell carcinoma     left shoulder; mid chest; right eyelid (02/24/2015)  . Prostate cancer ([place])     S/P seed implant  . Fuchs' corneal dystrophy   . Thrombocytopenia ([place])     a. Noted on prior labs, unclear of what w/u done.  . Trifascicular block     Past Surgical History  Procedure Laterality Date  . Thorocotomy with lobectomy Right ~ 1991    partial removal right lung  . Colonoscopy w/ polypectomy  06/08/2005 and 10/18/2010    8 mm adenoma 2006, none 2012. Diverticulosis and internal hemorrhoids.  . Eye surgery    . Cataract extraction w/ intraocular lens  implant, bilateral Bilateral ~ 2011-2012  . Lymph node biopsy      mid chest  . Insertion prostate radiation seed  ~ 2011  . Corneal transplant Bilateral ~ 2011-2012    "@ same time as cataract OR"  . Finger surgery Right 2014    "reattached middle finger"  . Left  heart catheterization with coronary angiogram N/A 08/12/2014    Procedure: LEFT HEART CATHETERIZATION WITH CORONARY ANGIOGRAM;  Surgeon: Blane Ohara, MD;  Location: [facility] CATH LAB;  Service: Cardiovascular;  Laterality: N/A;  . Coronary angioplasty with stent placement  08/2014; 02/24/2015    "1 stent + 1 stent"  . Tonsillectomy  ~ 1950  . Laparoscopic cholecystectomy  2015  . Mohs surgery Right ~ 2011    "eyelid; for basal cell"  . Basal cell carcinoma excision Left     shoulder  . Cardiac catheterization N/A 02/24/2015    Procedure: Coronary/Bypass Graft CTO Intervention;  Surgeon: Jettie Booze, MD;  Location: [place] CV LAB;  Service: Cardiovascular;  Laterality: N/A;  . Cardiac catheterization  02/24/2015    Procedure: Coronary/Graft Atherectomy;  Surgeon: Jettie Booze, MD;  Location: [place] CV LAB;  Service: Cardiovascular;;     Current Outpatient Prescriptions    Medication Sig Dispense Refill  . aspirin EC 81 MG tablet Take 81 mg by mouth every evening.    . clopidogrel (PLAVIX) 75 MG tablet Take 1 tablet by mouth  daily with breakfast 90 tablet 0  . clotrimazole-betamethasone (LOTRISONE) cream Apply 1 application topically 2 (two) times daily as needed (rash).     . Ergocalciferol (VITAMIN D2) 2000 UNITS TABS Take 2,000 Units by mouth every evening.    . fluocinonide ointment (LIDEX) 6.06 % Apply 1 application topically as needed (Rash).    . metoprolol tartrate (LOPRESSOR) 25 MG tablet Take one-half tablet by  mouth twice a day 90 tablet 0  . Multiple Vitamin (MULTIVITAMIN WITH MINERALS) TABS tablet Take 1 tablet by mouth every evening.    . nitroGLYCERIN (NITROSTAT) 0.4 MG SL tablet Place 1 tablet (0.4 mg total) under the tongue every 5 (five) minutes as needed for chest pain. 25 tablet 3  . Omega-3 Fatty Acids (FISH OIL) 500 MG CAPS Take 1 capsule by mouth daily.    . rosuvastatin (CRESTOR) 20 MG tablet Take 1 tablet (20 mg total) by mouth daily. 90 tablet 0  . sildenafil (REVATIO) 20 MG tablet Take 60-120 mg by mouth as needed (erectile dysfunction).    . silodosin (RAPAFLO) 8 MG CAPS capsule Take 8 mg by mouth as needed (When he takes trips (hasn't used recently)).    . traMADol (ULTRAM) 50 MG tablet Take 50-100 mg by mouth See admin instructions. Take 2 tablets (100 mg) every morning, Take 1 tablet (50 mg ) every evening. Take 1 tablet (50 mg) in the afternoon if needed for pain relief.    [name] ZETIA 10 MG tablet      No current facility-administered medications for this visit.    Allergies:   Crestor    Social History:  The patient  reports that he has been smoking Cigars.  He has never used smokeless tobacco. He reports that he drinks about 1.2 oz of alcohol per week. He reports that he does not use illicit drugs.   Family History:  The patient's family history includes Coronary artery disease (age of onset: 59) in his sister; Heart disease  (age of onset: 20) in his father. There is no history of Colon cancer, Throat cancer, Pancreatic cancer, or Prostate cancer.    ROS:  Please see the history of present illness.   Otherwise, review of systems are positive for mild DOE-improving; weight gain.   All other systems are reviewed and negative.    PHYSICAL EXAM: VS:  BP 140/60 mmHg  Pulse  72  Ht 6' (1.829 m)  Wt 235 lb (106.595 kg)  BMI 31.86 kg/m2 , BMI Body mass index is 31.86 kg/(m^2). GEN: Well nourished, well developed, in no acute distress HEENT: scar above upper lip, otherwise normal Neck: no JVD, carotid bruits, or masses Cardiac: RRR; no murmurs, rubs, or gallops,no edema  Respiratory:  clear to auscultation bilaterally, normal work of breathing GI: soft, nontender, nondistended, + BS MS: no deformity or atrophy Skin: warm and dry, no rash;  No groin bruising, 2+ femoral pulses bilaterally Neuro:  Strength and sensation are intact Psych: euthymic mood, full affect     Recent Labs: 08/10/2014: Pro B Natriuretic peptide (BNP) 60.0 03/02/2015: ALT 41; BUN 17; Creatinine, Ser 0.96; Hemoglobin 13.0; Platelets 159.0; Potassium 4.3; Sodium 137   Lipid Panel    Component Value Date/Time   CHOL 161 11/10/2014 0754   TRIG 108.0 11/10/2014 0754   HDL 44.70 11/10/2014 0754   CHOLHDL 4 11/10/2014 0754   VLDL 21.6 11/10/2014 0754   LDLCALC 95 11/10/2014 0754     Other studies Reviewed: Additional studies/ records that were reviewed today with results demonstrating: cath with PCI report, vascular u/s results negative .   ASSESSMENT AND PLAN:  1. CAD: s/p multiple PCI.  DOing well.  Angina significantly reduced.  Not completely gone.  He has not felt the need to take a NTG. He will let us know if things get worse.  Imdur and Ranexa did not help in the past  2. Obesity: We spoke about trying to lose weight through diet and exercise.   3. Hyperlipidemia: Tolerating Crestor. Lipids well controlled in May  2016. 4. Dyspnea on exertion: This should improve with more exercise. BNP was normal earlier in 2016. I did give him the go ahead to try to increase his activity level slowly to improve stamina.   Current medicines are reviewed at length with the patient today.  The patient concerns regarding his medicines were addressed.  The following changes have been made:  No change  Labs/ tests ordered today include:  No orders of the defined types were placed in this encounter.    Recommend 150 minutes/week of aerobic exercise Low fat, low carb, high fiber diet recommended  Disposition:   FU in 6 months   Teresita Madura., MD  03/30/2015 8:49 AM    [facility] [address], [place], [place]  [address] Phone: [phone]; Fax: [phone]

## 2015-04-13 ENCOUNTER — Other Ambulatory Visit (INDEPENDENT_AMBULATORY_CARE_PROVIDER_SITE_OTHER): Payer: Medicare Other

## 2015-04-13 DIAGNOSIS — I25119 Atherosclerotic heart disease of native coronary artery with unspecified angina pectoris: Secondary | ICD-10-CM

## 2015-05-18 ENCOUNTER — Other Ambulatory Visit: Payer: Self-pay | Admitting: Cardiology

## 2015-05-18 NOTE — Telephone Encounter (Signed)
Rx request sent to pharmacy.  

## 2015-05-20 ENCOUNTER — Other Ambulatory Visit: Payer: Self-pay | Admitting: *Deleted

## 2015-05-20 MED ORDER — ROSUVASTATIN CALCIUM 20 MG PO TABS: 20.0000 mg | ORAL_TABLET | Freq: Every day | ORAL | Status: DC

## 2015-05-27 ENCOUNTER — Other Ambulatory Visit: Payer: Self-pay | Admitting: Physician Assistant

## 2015-06-24 ENCOUNTER — Encounter: Payer: Self-pay | Admitting: Internal Medicine

## 2015-08-16 ENCOUNTER — Other Ambulatory Visit: Payer: Self-pay

## 2015-08-16 ENCOUNTER — Telehealth: Payer: Self-pay | Admitting: Interventional Cardiology

## 2015-08-16 DIAGNOSIS — E785 Hyperlipidemia, unspecified: Secondary | ICD-10-CM

## 2015-08-16 NOTE — Telephone Encounter (Signed)
If he can stay on Plavix for the injection, it is ok.

## 2015-08-16 NOTE — Telephone Encounter (Signed)
New message   Pt calling to speak to rn about if he should have a shot or not.   pt did not go in to detail about name of shot or what the shot is for   he wants rn or Dr. To call him  It is not for an appt- pt has appt made 10-13-15

## 2015-08-16 NOTE — Telephone Encounter (Signed)
**Note De-Identified Luis [name] Obfuscation** The pt wants to know if it is ok for him to have a Cortizone injection in his spine. He states that he is a pt at [facility] but cant remember his physician's name at this time. Please advise.

## 2015-08-17 NOTE — Telephone Encounter (Signed)
The pt is advised and he verbalized understanding.  Also, this message has been faxed to Dr Laroy Apple at [facility] through the [facility] system.

## 2015-10-06 ENCOUNTER — Other Ambulatory Visit: Payer: Medicare Other

## 2015-10-13 ENCOUNTER — Encounter: Payer: Self-pay | Admitting: Interventional Cardiology

## 2015-10-13 ENCOUNTER — Other Ambulatory Visit (INDEPENDENT_AMBULATORY_CARE_PROVIDER_SITE_OTHER): Payer: Medicare Other | Admitting: *Deleted

## 2015-10-13 ENCOUNTER — Ambulatory Visit (INDEPENDENT_AMBULATORY_CARE_PROVIDER_SITE_OTHER): Payer: Medicare Other | Admitting: Interventional Cardiology

## 2015-10-13 VITALS — BP 160/80 | HR 64 | Ht 72.0 in | Wt 233.0 lb

## 2015-10-13 DIAGNOSIS — E669 Obesity, unspecified: Secondary | ICD-10-CM

## 2015-10-13 DIAGNOSIS — E785 Hyperlipidemia, unspecified: Secondary | ICD-10-CM

## 2015-10-13 DIAGNOSIS — I25119 Atherosclerotic heart disease of native coronary artery with unspecified angina pectoris: Secondary | ICD-10-CM

## 2015-10-13 DIAGNOSIS — R0609 Other forms of dyspnea: Secondary | ICD-10-CM

## 2015-10-13 DIAGNOSIS — I10 Essential (primary) hypertension: Secondary | ICD-10-CM

## 2015-10-13 LAB — LIPID PANEL
CHOL/HDL RATIO: 3.2 ratio (ref <=5.0)
CHOLESTEROL: 155 mg/dL (ref 125–200)
HDL: 49 mg/dL (ref >=40)
LDL Cholesterol: 69 mg/dL (ref <130)
TRIGLYCERIDES: 186 mg/dL — AB (ref <150)
VLDL: 37 mg/dL — ABNORMAL HIGH (ref <30)

## 2015-10-13 LAB — HEPATIC FUNCTION PANEL
ALT: 27 U/L (ref 9–46)
AST: 31 U/L (ref 10–35)
Albumin: 4.1 g/dL (ref 3.6–5.1)
Alkaline Phosphatase: 61 U/L (ref 40–115)
BILIRUBIN DIRECT: 0.2 mg/dL (ref <=0.2)
Indirect Bilirubin: 0.4 mg/dL (ref 0.2–1.2)
TOTAL PROTEIN: 6.2 g/dL (ref 6.1–8.1)
Total Bilirubin: 0.6 mg/dL (ref 0.2–1.2)

## 2015-10-13 NOTE — Addendum Note (Signed)
Addended by: Eulis Foster on: 10/13/2015 09:58 AM   Modules accepted: Orders

## 2015-10-13 NOTE — Patient Instructions (Signed)
Medication Instructions:  Stop taking Aspirin in October 2017-all other medications remain the same.  Labwork: None  Testing/Procedures: None  Follow-Up: Your physician wants you to follow-up in: 1 year. You will receive a reminder letter in the mail two months in advance. If you don't receive a letter, please call our office to schedule the follow-up appointment.   Any Other Special Instructions Will Be Listed Below (If Applicable).     If you need a refill on your cardiac medications before your next appointment, please call your pharmacy.

## 2015-10-13 NOTE — Progress Notes (Signed)
Patient ID: Luis [name], male   DOB: 30-Apr-1941, 75 y.o.   MRN: [id number]     Cardiology Office Note   Date:  10/13/2015   ID:  Luis [name], DOB Jul 02, 1940, MRN [id number]  PCP:  [name], MD    No chief complaint on file. f/u CAD   Wt Readings from Last 3 Encounters:  10/13/15 233 lb (105.688 kg)  03/30/15 235 lb (106.595 kg)  03/02/15 232 lb 1.9 oz (105.289 kg)       History of Present Illness: Luis [name] is a 75 y.o. male  with history of CAD (s/p DES to diagonal 08/2014, s/p recent CTO PCI with overlapping DES to ramus 02/2015), HTN, HLD, prostate CA, and pulmonary fibrosis due to sarcoidosis. He has history of cath in 08/2014 showing 2 vessel CAD with severe diagonal stenosis and total occlusion of ramus intermedius. They were unable to cross total ramus occlusion at the time. He underwent successful PCI of the diagonal stenosis with DES. He was placed on aspirin and Plavix. No relief with Ranexa or Imdur. He had CTO PCI of the ramus due to increasing shortness of breath with exertion and the chest pain. He underwent this procedure on 02/24/15 - the 100% stenosis in the ramus was treated with 2 overlapping DES (Synergy DES 2.4x38 mm and 3.0 x 18mm).   No chest pain or SHOB.  Walks nearly daily for exercise.  He h[place].  Weight has increased with a recent vacation.  Was recently in [place].  No bleeding problems.  Some easy bruising.    Past Medical History  Diagnosis Date  . Hypertension   . Hyperlipidemia   . Sarcoidosis ([place])   . History of adenomatous polyp of colon 05/2005    8 mm adenoma  . Elevated lipase   . Diverticulosis 05/2005  . CAD (coronary artery disease)     a. 08/2014 Cath/PCI: LM nl, LAD 30p, D1 95 (2.25x12 Resolute Integrity DES), LCX small, RI 100 (attempted PCI) - branches fill via L->L collats, RCA dominant, nl, RPDA/PLA nl, EF 60^. b. 02/24/2015 PCI CTO of Ramus DES x2.  . Chronic bronchitis (HCC)     hx  . History  of blood transfusion     "related to some of my surgeries"  . Arthritis     "mid back; hands; knees" (02/24/2015)  . Basal cell carcinoma     left shoulder; mid chest; right eyelid (02/24/2015)  . Prostate cancer ([place])     S/P seed implant  . Fuchs' corneal dystrophy   . Thrombocytopenia ([place])     a. Noted on prior labs, unclear of what w/u done.  . Trifascicular block     Past Surgical History  Procedure Laterality Date  . Thorocotomy with lobectomy Right ~ 1991    partial removal right lung  . Colonoscopy w/ polypectomy  06/08/2005 and 10/18/2010    8 mm adenoma 2006, none 2012. Diverticulosis and internal hemorrhoids.  . Eye surgery    . Cataract extraction w/ intraocular lens  implant, bilateral Bilateral ~ 2011-2012  . Lymph node biopsy      mid chest  . Insertion prostate radiation seed  ~ 2011  . Corneal transplant Bilateral ~ 2011-2012    "@ same time as cataract OR"  . Finger surgery Right 2014    "reattached middle finger"  . Left heart catheterization with coronary angiogram N/A 08/12/2014    Procedure: LEFT HEART CATHETERIZATION WITH CORONARY ANGIOGRAM;  Surgeon:  Blane Ohara, MD;  Location: [facility] CATH LAB;  Service: Cardiovascular;  Laterality: N/A;  . Coronary angioplasty with stent placement  08/2014; 02/24/2015    "1 stent + 1 stent"  . Tonsillectomy  ~ 1950  . Laparoscopic cholecystectomy  2015  . Mohs surgery Right ~ 2011    "eyelid; for basal cell"  . Basal cell carcinoma excision Left     shoulder  . Cardiac catheterization N/A 02/24/2015    Procedure: Coronary/Bypass Graft CTO Intervention;  Surgeon: Jettie Booze, MD;  Location: [place] CV LAB;  Service: Cardiovascular;  Laterality: N/A;  . Cardiac catheterization  02/24/2015    Procedure: Coronary/Graft Atherectomy;  Surgeon: Jettie Booze, MD;  Location: [place] CV LAB;  Service: Cardiovascular;;     Current Outpatient Prescriptions  Medication Sig Dispense Refill  . aspirin EC 81 MG  tablet Take 81 mg by mouth every evening.    . Cholecalciferol (VITAMIN D3) 3000 units TABS Take 3,000 mg by mouth daily.    . clopidogrel (PLAVIX) 75 MG tablet Take 1 tablet by mouth  daily with breakfast 90 tablet 1  . clotrimazole-betamethasone (LOTRISONE) cream Apply 1 application topically 2 (two) times daily as needed (rash).     . fluocinonide ointment (LIDEX) [id number] % Apply 1 application topically daily as needed (Rash).     . Glucosamine Sulfate 500 MG TABS Take 1 tablet by mouth daily.     . metoprolol tartrate (LOPRESSOR) 25 MG tablet Take one-half tablet by  mouth twice a day 90 tablet 1  . Multiple Vitamin (MULTIVITAMIN WITH MINERALS) TABS tablet Take 1 tablet by mouth every evening.    . nitroGLYCERIN (NITROSTAT) 0.4 MG SL tablet Place 0.4 mg under the tongue every 5 (five) minutes as needed for chest pain (MAY TAKE UP TO 3 DOSES BEFORE CALLING EMS).    . Omega-3 Fatty Acids (FISH OIL) 1200 MG CAPS Take 1,400 mg by mouth daily.     . rosuvastatin (CRESTOR) 20 MG tablet Take 1 tablet (20 mg total) by mouth daily. 90 tablet 1  . sildenafil (REVATIO) 20 MG tablet Take 60-120 mg by mouth as needed (erectile dysfunction).    . silodosin (RAPAFLO) 8 MG CAPS capsule Take 8 mg by mouth as needed (When he takes trips (hasn't used recently)).    . traMADol (ULTRAM) 50 MG tablet Take 50-100 mg by mouth See admin instructions. Take 2 tablets (100 mg) every morning, Take 1 tablet (50 mg ) every evening. Take 1 tablet (50 mg) in the afternoon if needed for pain relief.     No current facility-administered medications for this visit.    Allergies:   Review of patient's allergies indicates no active allergies.    Social History:  The patient  reports that he has been smoking Cigars.  He has never used smokeless tobacco. He reports that he drinks about 1.2 oz of alcohol per week. He reports that he does not use illicit drugs.   Family History:  The patient's family history includes Coronary artery  disease (age of onset: 28) in his sister; Heart attack in his sister; Heart disease (age of onset: 72) in his father. There is no history of Colon cancer, Throat cancer, Pancreatic cancer, Prostate cancer, Stroke, or Hypertension.    ROS:  Please see the history of present illness.   Otherwise, review of systems are positive for joint pains (left hip).   All other systems are reviewed and negative.    PHYSICAL  EXAM: VS:  BP 160/80 mmHg  Pulse 64  Ht 6' (1.829 m)  Wt 233 lb (105.688 kg)  BMI 31.59 kg/m2 , BMI Body mass index is 31.59 kg/(m^2). GEN: Well nourished, well developed, in no acute distress HEENT: normal Neck: no JVD, carotid bruits, or masses Cardiac: RRR; no murmurs, rubs, or gallops,no edema  Respiratory:  clear to auscultation bilaterally, normal work of breathing GI: soft, nontender, nondistended, + BS MS: no deformity or atrophy Skin: warm and dry, no rash Neuro:  Strength and sensation are intact Psych: euthymic mood, full affect    Recent Labs: 03/02/2015: ALT 41; BUN 17; Creatinine, Ser 0.96; Hemoglobin 13.0; Platelets 159.0; Potassium 4.3; Sodium 137   Lipid Panel    Component Value Date/Time   CHOL 161 11/10/2014 0754   TRIG 108.0 11/10/2014 0754   HDL 44.70 11/10/2014 0754   CHOLHDL 4 11/10/2014 0754   VLDL 21.6 11/10/2014 0754   LDLCALC 95 11/10/2014 0754     Other studies Reviewed: Additional studies/ records that were reviewed today with results demonstrating: prior cath records.   ASSESSMENT AND PLAN:  1. CAD: s/p multiple PCI. DOing well. Angina resolved. He has not felt the need to take a NTG. He will let us know if things get worse. Imdur and Ranexa did not help in the past.  No NTG use since the last visit.  2. Obesity: We spoke about trying to lose weight through diet and exercise. In October 2017, could stop aspirin but would continue clopidogrel. 3. Hyperlipidemia: Tolerating Crestor. Lipids well controlled in May 2016. 4. Dyspnea  on exertion: Only with hiking inclines.  This should improve with more exercise. BNP was normal in early 2016. I did give him the go ahead to try to increase his activity level slowly to improve stamina.  5. HTN: Controlled outside of MDs office.  He was on losartan in the past.  BP came down when he went back to work, so he stopped his medicines.    Current medicines are reviewed at length with the patient today.  The patient concerns regarding his medicines were addressed.  The following changes have been made:  No change  Labs/ tests ordered today include:  No orders of the defined types were placed in this encounter.    Recommend 150 minutes/week of aerobic exercise Low fat, low carb, high fiber diet recommended  Disposition:   FU in 1 year   Teresita Madura., MD  10/13/2015 10:54 AM    [facility] [address], [place], [place]  [address] Phone: [phone]; Fax: [phone]

## 2015-10-25 ENCOUNTER — Encounter: Payer: Self-pay | Admitting: Interventional Cardiology

## 2015-11-15 ENCOUNTER — Other Ambulatory Visit: Payer: Self-pay | Admitting: Cardiology

## 2016-01-20 ENCOUNTER — Other Ambulatory Visit: Payer: Self-pay | Admitting: Interventional Cardiology

## 2016-02-29 ENCOUNTER — Other Ambulatory Visit: Payer: Self-pay | Admitting: Nurse Practitioner

## 2016-03-14 ENCOUNTER — Other Ambulatory Visit: Payer: Self-pay | Admitting: Cardiology

## 2016-03-14 NOTE — Telephone Encounter (Signed)
Rx request sent to pharmacy.  

## 2016-04-05 ENCOUNTER — Other Ambulatory Visit: Payer: Self-pay | Admitting: Interventional Cardiology

## 2016-05-15 ENCOUNTER — Telehealth: Payer: Self-pay | Admitting: Interventional Cardiology

## 2016-05-15 NOTE — Telephone Encounter (Signed)
Pt states he continued walking, the pain resolved in seconds, pt denies any other symptoms. Pt states over the last couple of weeks he has had  several episodes of chest pain at rest that resolve in seconds or immediately with 1 NTG. Pt has been scheduled to see Nell Range, PA tomorrow at 8:30 AM.  Pt advised if he has more chest pain/shortness of breath not immediately resolved with 1 NTG he should call 911 right away.  Pt agreed.

## 2016-05-15 NOTE — Telephone Encounter (Signed)
New message  Chest pains intermitten  1. No pains now 2. SOB, sweating 3. 2 weeks 4. Comes and goes 5. Pt has nitro, has not taken it

## 2016-05-15 NOTE — Progress Notes (Signed)
Cardiology Office Note    Date:  05/16/2016   ID:  Luis [name], DOB 1941/03/18, MRN [id number]  PCP:  No primary care provider on file.  Cardiologist:  Dr. Irish Lack  CC: SOB  History of Present Illness:  Luis [name] is a 75 y.o. male with a history of CAD s/p multiple PCIs, HTN, HLD, obesity, prostate CA and pulmonary fibrosis due to sarcoidosis who presents to clinic for evaluation of chest pain.   He has history of cath in 08/2014 showing 2 vessel CAD with severe diagonal stenosis and total occlusion of ramus intermedius. They were unable to cross total ramus occlusion at the time. He underwent successful PCI of the diagonal stenosis with DES. He was placed on aspirin and Plavix. No relief with Ranexa or Imdur. He had CTO PCI of the ramus due to increasing shortness of breath with exertion and the chest pain. He underwent this procedure on 02/24/15 - the 100% stenosis in the ramus was treated with 2 overlapping DES (Synergy DES 2.4x38 mm and 3.0 x 25mm).   He was last seen by Dr. Irish Lack in 10/2015 and was doing well at that time.   He called the office yesterday to report chest pain while walking that resolved with 1 SL NTG .  Today he presents to clinic for evaluation of SOB and chest pain. For about 6 weeks he has noticed some increased exertional SOB. He can been doing something and the he will sit down and relax and then will get a twinge that last seconds. Yesterday while driving home he got some chest pain that he did take a SL NTG for. Noticed his HR went up to 124 randomly this AM on his I watch. No LE edema, orthopnea or PND. No dizziness or syncope. No blood in his stool or urine.    Past Medical History:  Diagnosis Date  . Arthritis    "mid back; hands; knees" (02/24/2015)  . Basal cell carcinoma    left shoulder; mid chest; right eyelid (02/24/2015)  . CAD (coronary artery disease)    a. 08/2014 Cath/PCI: LM nl, LAD 30p, D1 95 (2.25x12 Resolute Integrity DES), LCX  small, RI 100 (attempted PCI) - branches fill via L->L collats, RCA dominant, nl, RPDA/PLA nl, EF 60^. b. 02/24/2015 PCI CTO of Ramus DES x2.  . Chronic bronchitis (HCC)    hx  . Diverticulosis 05/2005  . Elevated lipase   . Fuchs' corneal dystrophy   . History of adenomatous polyp of colon 05/2005   8 mm adenoma  . History of blood transfusion    "related to some of my surgeries"  . Hyperlipidemia   . Hypertension   . Prostate cancer ([place])    S/P seed implant  . Sarcoidosis ([place])   . Thrombocytopenia ([place])    a. Noted on prior labs, unclear of what w/u done.  . Trifascicular block     Past Surgical History:  Procedure Laterality Date  . BASAL CELL CARCINOMA EXCISION Left    shoulder  . CARDIAC CATHETERIZATION N/A 02/24/2015   Procedure: Coronary/Bypass Graft CTO Intervention;  Surgeon: Jettie Booze, MD;  Location: [facility];  Service: Cardiovascular;  Laterality: N/A;  . CARDIAC CATHETERIZATION  02/24/2015   Procedure: Coronary/Graft Atherectomy;  Surgeon: Jettie Booze, MD;  Location: [place] [facility];  Service: Cardiovascular;;  . CATARACT EXTRACTION W/ INTRAOCULAR LENS  IMPLANT, BILATERAL Bilateral ~ 2011-2012  . COLONOSCOPY W/ POLYPECTOMY  06/08/2005 and 10/18/2010   8  mm adenoma 2006, none 2012. Diverticulosis and internal hemorrhoids.  . CORNEAL TRANSPLANT Bilateral ~ 2011-2012   "@ same time as cataract OR"  . CORONARY ANGIOPLASTY WITH STENT PLACEMENT  08/2014; 02/24/2015   "1 stent + 1 stent"  . EYE SURGERY    . FINGER SURGERY Right 2014   "reattached middle finger"  . INSERTION PROSTATE RADIATION SEED  ~ 2011  . LAPAROSCOPIC CHOLECYSTECTOMY  2015  . LEFT HEART CATHETERIZATION WITH CORONARY ANGIOGRAM N/A 08/12/2014   Procedure: LEFT HEART CATHETERIZATION WITH CORONARY ANGIOGRAM;  Surgeon: Blane Ohara, MD;  [facility];  Service: Cardiovascular;  Laterality: N/A;  . LYMPH NODE BIOPSY     mid chest  . MOHS SURGERY Right ~ 2011    "eyelid; for basal cell"  . THOROCOTOMY WITH LOBECTOMY Right ~ 1991   partial removal right lung  . TONSILLECTOMY  ~ 1950    Current Medications: Outpatient Medications Prior to Visit  Medication Sig Dispense Refill  . Cholecalciferol (VITAMIN D3) 3000 units TABS Take 3,000 mg by mouth daily.    . clopidogrel (PLAVIX) 75 MG tablet TAKE 1 TABLET BY MOUTH  DAILY WITH BREAKFAST 90 tablet 3  . clotrimazole-betamethasone (LOTRISONE) cream Apply 1 application topically 2 (two) times daily as needed (rash).     . fluocinonide ointment (LIDEX) [id number] % Apply 1 application topically daily as needed (Rash).     . Glucosamine Sulfate 500 MG TABS Take 1 tablet by mouth daily.     . metoprolol tartrate (LOPRESSOR) 25 MG tablet Take 0.5 tablets (12.5 mg total) by mouth 2 (two) times daily. 30 tablet 11  . Multiple Vitamin (MULTIVITAMIN WITH MINERALS) TABS tablet Take 1 tablet by mouth every evening.    [name] NITROSTAT 0.4 MG SL tablet PLACE 1 TABLET (0.4 MG TOTAL) UNDER THE TONGUE EVERY 5 (FIVE) MINUTES AS NEEDED FOR CHEST PAIN. 25 tablet 4  . Omega-3 Fatty Acids (FISH OIL) 1200 MG CAPS Take 1,400 mg by mouth daily.     . rosuvastatin (CRESTOR) 20 MG tablet Take 1 tablet by mouth  daily 90 tablet 3  . sildenafil (REVATIO) 20 MG tablet Take 60-120 mg by mouth as needed (erectile dysfunction).    . silodosin (RAPAFLO) 8 MG CAPS capsule Take 8 mg by mouth as needed (When he takes trips (hasn't used recently)).    . traMADol (ULTRAM) 50 MG tablet Take 50-100 mg by mouth See admin instructions. Take 2 tablets (100 mg) every morning, Take 1 tablet (50 mg ) every evening. Take 1 tablet (50 mg) in the afternoon if needed for pain relief.    [name] aspirin EC 81 MG tablet Take 81 mg by mouth every evening.    . metoprolol tartrate (LOPRESSOR) 25 MG tablet Take one-half tablet by  mouth twice a day 90 tablet 1   No facility-administered medications prior to visit.      Allergies:   Patient has no known allergies.    Social History   Social History  . Marital status: Married    Spouse name: N/A  . Number of children: 2  . Years of education: N/A   Occupational History  . Unemployed    Social History Main Topics  . Smoking status: Current Some Day Smoker    Packs/day: 0.00    Years: 59.00    Types: Cigars  . Smokeless tobacco: Never Used     Comment: 02/24/2015 "quit cigarettes ~ 2000 ago but smokes cigars ionce/month"  . Alcohol use  1.2 oz/week    1 Glasses of wine, 1 Standard drinks or equivalent per week     Comment: Drinks maybe 1-2 drinks a week or less  . Drug use: No  . Sexual activity: Not Asked   Other Topics Concern  . None   Social History Narrative   Lives at home wife.       Family History:  The patient's family history includes Coronary artery disease (age of onset: 74) in his sister; Heart attack in his sister; Heart disease (age of onset: 55) in his father.     ROS:   Please see the history of present illness.    ROS All other systems reviewed and are negative.   PHYSICAL EXAM:   VS:  BP 128/72   Pulse (!) 59   Ht 6' (1.829 m)   Wt 236 lb (107 kg)   BMI 32.01 kg/m    GEN: Well nourished, well developed, in no acute distress, obese HEENT: normal  Neck: no JVD, carotid bruits, or masses Cardiac: RRR; no murmurs, rubs, or gallops,no edema  Respiratory:  clear to auscultation bilaterally, normal work of breathing GI: soft, nontender, nondistended, + BS MS: no deformity or atrophy  Skin: warm and dry, no rash Neuro:  Alert and Oriented x 3, Strength and sensation are intact Psych: euthymic mood, full affect  Wt Readings from Last 3 Encounters:  05/16/16 236 lb (107 kg)  10/13/15 233 lb (105.7 kg)  03/30/15 235 lb (106.6 kg)      Studies/Labs Reviewed:   EKG:  EKG is ordered today.  The ekg ordered today demonstrates shows sinus brady with 1st AV block, RBBB, LAFB (trifascicilar block) stable from previous.  Recent Labs: 10/13/2015: ALT 27   Lipid  Panel    Component Value Date/Time   CHOL 155 10/13/2015 1005   TRIG 186 (H) 10/13/2015 1005   HDL 49 10/13/2015 1005   CHOLHDL 3.2 10/13/2015 1005   VLDL 37 (H) 10/13/2015 1005   LDLCALC 69 10/13/2015 1005    Additional studies/ records that were reviewed today include:   08/2014 Cardiac Catheterization Procedure Note Procedure: Left Heart Cath, Selective Coronary Angiography, LV angiography, PTCA and stenting of the first diagonal  Indication: Exertional chest pain, CCS Class 3 angina  Procedural Details: The right wrist was prepped, draped, and anesthetized with 1% lidocaine. Using the modified Seldinger technique, a 5/6 French Slender sheath was introduced into the right radial artery. 3 mg of verapamil was administered through the sheath, weight-based unfractionated heparin was administered intravenously. Standard Judkins catheters were used for selective coronary angiography and left ventriculography. Catheter exchanges were performed over an exchange length guidewire.  PROCEDURAL FINDINGS Hemodynamics: AO 132/59 LV 140/17  Coronary angiography: Coronary dominance: right  Left mainstem: The left main is patent. There is no obstructive disease. The vessel divides into the LAD, intermediate branch, and left circumflex.  Left anterior descending (LAD): The LAD is patent to the apex. The proximal LAD has diffuse irregularities. At the first septal perforator there is 30% stenosis. The mid and distal LAD are widely patent and the vessel wraps around the LV apex. The first diagonal is medium in caliber with a 95% focal stenosis.  Left circumflex (LCx): The left circumflex courses down the AV groove and is small in caliber. There is an intermediate branch that is totally occluded proximally just after the left main divides. This is a large branch with 2 major subbranches that fill from left to left collaterals and  retrograde fill back to the proximal  vessel.  Right coronary artery (RCA): This is a dominant vessel. The vessel is smooth throughout its course. The PDA, PLA, and acute marginal branches are all patent.  Left ventriculography: There is mild hypokinesis of the mid inferior wall. The LVEF is 60%. PCI Data: Vessel - diagonal/Segment - diagonal 1 Percent Stenosis (pre) 90 TIMI-flow 3 Stent 2.25x12 mm Resolute DES Percent Stenosis (post) 0 TIMI-flow (post) 3  Estimated Blood Loss: minimal  Final Conclusions:  1. 2 vessel CAD with severe diagonal stenosis and total occlusion of the ramus intermedius 2. Widely patent LAD and RCA 3. Preserved LV systolic function 4. Successful PCI of the diagonal with a DES platform 5. Unable to cross total ramus occlusion   Recommendations:  DAPT With ASA and plavix x 12 months. Initiate medical therapy. If angina persists would refer for CTO-PCI. Films reviewed with Dr Irish Lack.    02/24/15 Procedures   Coronary/Bypass Graft CTO Intervention  Coronary/Graft Atherectomy  Conclusion    Ramus lesion, 100% stenosed. There is a 0% residual stenosis post intervention with overlapping Synergy drug-eluting stents. 2.5 x 38 mm and 3.0 x 12 mm.   Continue dual anti-platelet therapy for at least a year and likely longer given the length of this occlusion.  He'll need aggressive secondary prevention as well. He'll be watched overnight.      ASSESSMENT & PLAN:   SOB: my initial plan was to do a nuclear stress test but he was hesitant as "these never show anything on him." I discussed his case with Dr. Irish Lack, his primary cardiologist. Plan is to check a BNP and basic lab work and start Ranexa 500mg  BID and follow him. If Ranexa is too expensive than we can trial imdur ( he actually does not take the sildenafil for ED often and is okay not taking this ). Will also check a 2D ECHO to evaluate for pulm HTN given his pulmonary sarcoid. If he does not improve we will plan for repeat heart  catheterization.   CAD: continue ASA/plavix, statin and BB.   HLD: LDL at goal @ 69 in 10/2015. Continue Crestor  HTN: BP well controlled currently.   Trifascicular block: HR 59. Will continue to monitor. Continue lopressor 12.5 mg daily for now. We discussed how eventually he may require a pacemaker.   Elevated HR: noted on his I watch once this AM (HR transiently up to 124 while at rest). He will watch for anymore episodes like this and let us know. We could potentially order a cardiac monitor.    Total time spent with patient was over 40 minutes which included evaluating patient, reviewing record and coordinating care. Face to face time >50%. He had many questions and I discussed case with his primary cardiologist.   Medication Adjustments/Labs and Tests Ordered: Current medicines are reviewed at length with the patient today.  Concerns regarding medicines are outlined above.  Medication changes, Labs and Tests ordered today are listed in the Patient Instructions below. Patient Instructions  Medication Instructions:  Your physician has recommended you make the following change in your medication:  1.  START Ranexa 500 mg taking 1 tablet twice a day   Labwork: TODAY:  BMET, BNP, & CBC  Testing/Procedures: Your physician has requested that you have an echocardiogram. Echocardiography is a painless test that uses sound waves to create images of your heart. It provides your doctor with information about the size and shape of your heart and how well  your heart's chambers and valves are working. This procedure takes approximately one hour. There are no restrictions for this procedure.   Follow-Up: Your physician recommends that you schedule a follow-up appointment in: 06/01/16 WITH KATIE Lyrik Dockstader, PA-C, ARRIVE AT 8:15 A.M.   Any Other Special Instructions Will Be Listed Below (If Applicable).  Echocardiogram An echocardiogram, or echocardiography, uses sound waves (ultrasound) to  produce an image of your heart. The echocardiogram is simple, painless, obtained within a short period of time, and offers valuable information to your health care provider. The images from an echocardiogram can provide information such as:  Evidence of coronary artery disease (CAD).  Heart size.  Heart muscle function.  Heart valve function.  Aneurysm detection.  Evidence of a past heart attack.  Fluid buildup around the heart.  Heart muscle thickening.  Assess heart valve function. Tell a health care provider about:  Any allergies you have.  All medicines you are taking, including vitamins, herbs, eye drops, creams, and over-the-counter medicines.  Any problems you or family members have had with anesthetic medicines.  Any blood disorders you have.  Any surgeries you have had.  Any medical conditions you have.  Whether you are pregnant or may be pregnant. What happens before the procedure? No special preparation is needed. Eat and drink normally. What happens during the procedure?  In order to produce an image of your heart, gel will be applied to your chest and a wand-like tool (transducer) will be moved over your chest. The gel will help transmit the sound waves from the transducer. The sound waves will harmlessly bounce off your heart to allow the heart images to be captured in real-time motion. These images will then be recorded.  You may need an IV to receive a medicine that improves the quality of the pictures. What happens after the procedure? You may return to your normal schedule including diet, activities, and medicines, unless your health care provider tells you otherwise. This information is not intended to replace advice given to you by your health care provider. Make sure you discuss any questions you have with your health care provider. Document Released: 05/26/2000 Document Revised: 01/15/2016 Document Reviewed: 02/03/2013 Elsevier Interactive Patient  Education  2017 [employer].  If you need a refill on your cardiac medications before your next appointment, please call your pharmacy.      Signed, Angelena Form, PA-C  05/16/2016 9:42 AM    [facility] [address], [place], [place]  [address] Phone: [phone]; Fax: [phone]

## 2016-05-15 NOTE — Telephone Encounter (Signed)
Pt states moving a cabinet a couple of days he was very short of breath. Pt states he exercises regularly, he is short of breath in the beginning but it gets better as he exercise. Pt states this morning while walking he had chest pain with pain down his left arm.

## 2016-05-16 ENCOUNTER — Ambulatory Visit (INDEPENDENT_AMBULATORY_CARE_PROVIDER_SITE_OTHER): Payer: Medicare Other | Admitting: Physician Assistant

## 2016-05-16 ENCOUNTER — Encounter: Payer: Self-pay | Admitting: Physician Assistant

## 2016-05-16 ENCOUNTER — Encounter (INDEPENDENT_AMBULATORY_CARE_PROVIDER_SITE_OTHER): Payer: Self-pay

## 2016-05-16 VITALS — BP 128/72 | HR 59 | Ht 72.0 in | Wt 236.0 lb

## 2016-05-16 DIAGNOSIS — I25119 Atherosclerotic heart disease of native coronary artery with unspecified angina pectoris: Secondary | ICD-10-CM | POA: Diagnosis not present

## 2016-05-16 DIAGNOSIS — R079 Chest pain, unspecified: Secondary | ICD-10-CM

## 2016-05-16 DIAGNOSIS — I453 Trifascicular block: Secondary | ICD-10-CM

## 2016-05-16 DIAGNOSIS — D860 Sarcoidosis of lung: Secondary | ICD-10-CM

## 2016-05-16 DIAGNOSIS — E785 Hyperlipidemia, unspecified: Secondary | ICD-10-CM | POA: Diagnosis not present

## 2016-05-16 DIAGNOSIS — E669 Obesity, unspecified: Secondary | ICD-10-CM

## 2016-05-16 DIAGNOSIS — I10 Essential (primary) hypertension: Secondary | ICD-10-CM

## 2016-05-16 DIAGNOSIS — R0609 Other forms of dyspnea: Secondary | ICD-10-CM | POA: Diagnosis not present

## 2016-05-16 LAB — CBC
HCT: 44.4 % (ref 38.5–50.0)
Hemoglobin: 14.8 g/dL (ref 13.2–17.1)
MCH: 30.6 pg (ref 27.0–33.0)
MCHC: 33.3 g/dL (ref 32.0–36.0)
MCV: 91.9 fL (ref 80.0–100.0)
MPV: 12.5 fL (ref 7.5–12.5)
PLATELETS: 150 10*3/uL (ref 140–400)
RBC: 4.83 MIL/uL (ref 4.20–5.80)
RDW: 14.5 % (ref 11.0–15.0)
WBC: 5.4 10*3/uL (ref 3.8–10.8)

## 2016-05-16 LAB — BASIC METABOLIC PANEL
BUN: 14 mg/dL (ref 7–25)
CALCIUM: 9 mg/dL (ref 8.6–10.3)
CO2: 31 mmol/L (ref 20–31)
CREATININE: 1.02 mg/dL (ref 0.70–1.18)
Chloride: 102 mmol/L (ref 98–110)
Glucose, Bld: 111 mg/dL — ABNORMAL HIGH (ref 65–99)
Potassium: 4.9 mmol/L (ref 3.5–5.3)
Sodium: 138 mmol/L (ref 135–146)

## 2016-05-16 LAB — BRAIN NATRIURETIC PEPTIDE: BRAIN NATRIURETIC PEPTIDE: 43.4 pg/mL (ref <100)

## 2016-05-16 MED ORDER — RANOLAZINE ER 500 MG PO TB12: 500.0000 mg | ORAL_TABLET | Freq: Two times a day (BID) | ORAL | 0 refills | Status: DC

## 2016-05-16 NOTE — Patient Instructions (Addendum)
Medication Instructions:  Your physician has recommended you make the following change in your medication:  1.  START Ranexa 500 mg taking 1 tablet twice a day   Labwork: TODAY:  BMET, BNP, & CBC  Testing/Procedures: Your physician has requested that you have an echocardiogram. Echocardiography is a painless test that uses sound waves to create images of your heart. It provides your doctor with information about the size and shape of your heart and how well your heart's chambers and valves are working. This procedure takes approximately one hour. There are no restrictions for this procedure.   Follow-Up: Your physician recommends that you schedule a follow-up appointment in: 06/01/16 WITH KATIE THOMPSON, PA-C, ARRIVE AT 8:15 A.M.   Any Other Special Instructions Will Be Listed Below (If Applicable).  Echocardiogram An echocardiogram, or echocardiography, uses sound waves (ultrasound) to produce an image of your heart. The echocardiogram is simple, painless, obtained within a short period of time, and offers valuable information to your health care provider. The images from an echocardiogram can provide information such as:  Evidence of coronary artery disease (CAD).  Heart size.  Heart muscle function.  Heart valve function.  Aneurysm detection.  Evidence of a past heart attack.  Fluid buildup around the heart.  Heart muscle thickening.  Assess heart valve function. Tell a health care provider about:  Any allergies you have.  All medicines you are taking, including vitamins, herbs, eye drops, creams, and over-the-counter medicines.  Any problems you or family members have had with anesthetic medicines.  Any blood disorders you have.  Any surgeries you have had.  Any medical conditions you have.  Whether you are pregnant or may be pregnant. What happens before the procedure? No special preparation is needed. Eat and drink normally. What happens during the  procedure?  In order to produce an image of your heart, gel will be applied to your chest and a wand-like tool (transducer) will be moved over your chest. The gel will help transmit the sound waves from the transducer. The sound waves will harmlessly bounce off your heart to allow the heart images to be captured in real-time motion. These images will then be recorded.  You may need an IV to receive a medicine that improves the quality of the pictures. What happens after the procedure? You may return to your normal schedule including diet, activities, and medicines, unless your health care provider tells you otherwise. This information is not intended to replace advice given to you by your health care provider. Make sure you discuss any questions you have with your health care provider. Document Released: 05/26/2000 Document Revised: 01/15/2016 Document Reviewed: 02/03/2013 Elsevier Interactive Patient Education  2017 [employer].  If you need a refill on your cardiac medications before your next appointment, please call your pharmacy.

## 2016-05-24 ENCOUNTER — Encounter: Payer: Self-pay | Admitting: Physician Assistant

## 2016-05-29 NOTE — Progress Notes (Signed)
Cardiology Office Note    Date:  06/01/2016   ID:  Luis [name], DOB 1940-08-09, MRN [id number]  PCP:  [name], MD  Cardiologist:  Dr. Irish Lack  CC: follow up of SOB/chest pain  History of Present Illness:  Luis [name] is a 75 y.o. male with a history of CAD s/p multiple PCIs, HTN, HLD, obesity, prostate CA and pulmonary fibrosis due to sarcoidosis who presents to clinic for follow up.   He has history of cath in 08/2014 showing 2 vessel CAD with severe diagonal stenosis and total occlusion of ramus intermedius. They were unable to cross total ramus occlusion at the time. He underwent successful PCI of the diagonal stenosis with DES. He was placed on aspirin and Plavix. He did not have good relief with Ranexa or Imdur and eventually had CTO PCI of the ramus due to increasing shortness of breath with exertion and the chest pain. He underwent this procedure on 02/24/15 - the 100% stenosis in the ramus was treated with 2 overlapping DES (Synergy DES 2.4x38 mm and 3.0 x 96mm).    I saw him in clinic on 05/16/16 for evaluation of SOB and chest pain x 6 weeks. I discussed case with Dr. Irish Lack who recommended checking a BNP (which was normal) and starting Ranexa to see if this helped. Plan was to re-assess at follow up and if still symptomatic set up coronary angiography. He reported his Katheran Awe said his HR went up above 120 that Am. I ordered a 2D ECHO to evaluate for pulmonary HTN given pulmonary sarcoid. This was completed on 05/30/16 and showed normal LV function, mild LVH, G1DD with no pulm HTN.   Today he presents to clinic for follow up. Ranexa has improved the CP and SOB, but still having minor chest pains that is more of a nuisance than anything.  Comes and goes and no set pattern to it. His I watch found this his HR went up to 120 again in the middle of the night. No palpitations. No LE edema, orthopnea or PND. No dizziness or syncope.   Past Medical History:  Diagnosis Date    . Arthritis    "mid back; hands; knees" (02/24/2015)  . Basal cell carcinoma    left shoulder; mid chest; right eyelid (02/24/2015)  . CAD (coronary artery disease)    a. 08/2014 Cath/PCI: LM nl, LAD 30p, D1 95 (2.25x12 Resolute Integrity DES), LCX small, RI 100 (attempted PCI) - branches fill via L->L collats, RCA dominant, nl, RPDA/PLA nl, EF 60^. b. 02/24/2015 PCI CTO of Ramus DES x2.  . Chronic bronchitis (HCC)    hx  . Diverticulosis 05/2005  . Elevated lipase   . Fuchs' corneal dystrophy   . History of adenomatous polyp of colon 05/2005   8 mm adenoma  . History of blood transfusion    "related to some of my surgeries"  . Hyperlipidemia   . Hypertension   . Prostate cancer ([place])    S/P seed implant  . Sarcoidosis ([place])   . Thrombocytopenia ([place])    a. Noted on prior labs, unclear of what w/u done.  . Trifascicular block     Past Surgical History:  Procedure Laterality Date  . BASAL CELL CARCINOMA EXCISION Left    shoulder  . CARDIAC CATHETERIZATION N/A 02/24/2015   Procedure: Coronary/Bypass Graft CTO Intervention;  Surgeon: Jettie Booze, MD;  Location: [place] [facility];  Service: Cardiovascular;  Laterality: N/A;  . CARDIAC CATHETERIZATION  02/24/2015  Procedure: Coronary/Graft Atherectomy;  Surgeon: Jettie Booze, MD;  Location: [place] [facility];  Service: Cardiovascular;;  . CATARACT EXTRACTION W/ INTRAOCULAR LENS  IMPLANT, BILATERAL Bilateral ~ 2011-2012  . COLONOSCOPY W/ POLYPECTOMY  06/08/2005 and 10/18/2010   8 mm adenoma 2006, none 2012. Diverticulosis and internal hemorrhoids.  . CORNEAL TRANSPLANT Bilateral ~ 2011-2012   "@ same time as cataract OR"  . CORONARY ANGIOPLASTY WITH STENT PLACEMENT  08/2014; 02/24/2015   "1 stent + 1 stent"  . EYE SURGERY    . FINGER SURGERY Right 2014   "reattached middle finger"  . INSERTION PROSTATE RADIATION SEED  ~ 2011  . LAPAROSCOPIC CHOLECYSTECTOMY  2015  . LEFT HEART CATHETERIZATION WITH CORONARY ANGIOGRAM  N/A 08/12/2014   Procedure: LEFT HEART CATHETERIZATION WITH CORONARY ANGIOGRAM;  Surgeon: Blane Ohara, MD;  Location: [facility] CATH LAB;  Service: Cardiovascular;  Laterality: N/A;  . LYMPH NODE BIOPSY     mid chest  . MOHS SURGERY Right ~ 2011   "eyelid; for basal cell"  . THOROCOTOMY WITH LOBECTOMY Right ~ 1991   partial removal right lung  . TONSILLECTOMY  ~ 1950    Current Medications: Outpatient Medications Prior to Visit  Medication Sig Dispense Refill  . Cholecalciferol (VITAMIN D3) 3000 units TABS Take 3,000 mg by mouth daily.    . clopidogrel (PLAVIX) 75 MG tablet TAKE 1 TABLET BY MOUTH  DAILY WITH BREAKFAST 90 tablet 3  . clotrimazole-betamethasone (LOTRISONE) cream Apply 1 application topically 2 (two) times daily as needed (rash).     . fluocinonide ointment (LIDEX) [id number] % Apply 1 application topically daily as needed (Rash).     . Glucosamine Sulfate 500 MG TABS Take 1 tablet by mouth daily.     . metoprolol tartrate (LOPRESSOR) 25 MG tablet Take 0.5 tablets (12.5 mg total) by mouth 2 (two) times daily. 30 tablet 11  . Multiple Vitamin (MULTIVITAMIN WITH MINERALS) TABS tablet Take 1 tablet by mouth every evening.    . Omega-3 Fatty Acids (FISH OIL) 1200 MG CAPS Take 1,400 mg by mouth daily.     . ranolazine (RANEXA) 500 MG 12 hr tablet Take 1 tablet (500 mg total) by mouth 2 (two) times daily. 60 tablet 0  . rosuvastatin (CRESTOR) 20 MG tablet Take 1 tablet by mouth  daily 90 tablet 3  . sildenafil (REVATIO) 20 MG tablet Take 60-120 mg by mouth as needed (erectile dysfunction).    . silodosin (RAPAFLO) 8 MG CAPS capsule Take 8 mg by mouth as needed (When he takes trips (hasn't used recently)).    . traMADol (ULTRAM) 50 MG tablet Take 50-100 mg by mouth See admin instructions. Take 2 tablets (100 mg) every morning, Take 1 tablet (50 mg ) every evening. Take 1 tablet (50 mg) in the afternoon if needed for pain relief.    [name] NITROSTAT 0.4 MG SL tablet PLACE 1 TABLET (0.4 MG TOTAL)  UNDER THE TONGUE EVERY 5 (FIVE) MINUTES AS NEEDED FOR CHEST PAIN. 25 tablet 4   No facility-administered medications prior to visit.      Allergies:   Patient has no known allergies.   Social History   Social History  . Marital status: Married    Spouse name: N/A  . Number of children: 2  . Years of education: N/A   Occupational History  . Unemployed    Social History Main Topics  . Smoking status: Current Some Day Smoker    Packs/day: 0.00    Years:  59.00    Types: Cigars  . Smokeless tobacco: Never Used     Comment: 02/24/2015 "quit cigarettes ~ 2000 ago but smokes cigars ionce/month"  . Alcohol use 1.2 oz/week    1 Glasses of wine, 1 Standard drinks or equivalent per week     Comment: Drinks maybe 1-2 drinks a week or less  . Drug use: No  . Sexual activity: Not Asked   Other Topics Concern  . None   Social History Narrative   Lives at home wife.       Family History:  The patient's family history includes Coronary artery disease (age of onset: 68) in his sister; Heart attack in his sister; Heart disease (age of onset: 43) in his father.     ROS:   Please see the history of present illness.    ROS All other systems reviewed and are negative.   PHYSICAL EXAM:   VS:  BP 136/68   Pulse 62   Ht 6' (1.829 m)   Wt 237 lb 12.8 oz (107.9 kg)   BMI 32.25 kg/m    GEN: Well nourished, well developed, in no acute distress, obese HEENT: normal  Neck: no JVD, carotid bruits, or masses Cardiac: RRR; no murmurs, rubs, or gallops,no edema  Respiratory:  clear to auscultation bilaterally, normal work of breathing GI: soft, nontender, nondistended, + BS MS: no deformity or atrophy  Skin: warm and dry, no rash Neuro:  Alert and Oriented x 3, Strength and sensation are intact Psych: euthymic mood, full affect  Wt Readings from Last 3 Encounters:  06/01/16 237 lb 12.8 oz (107.9 kg)  05/16/16 236 lb (107 kg)  10/13/15 233 lb (105.7 kg)      Studies/Labs Reviewed:    EKG:  EKG is NOT ordered today.   Recent Labs: 10/13/2015: ALT 27 05/16/2016: Brain Natriuretic Peptide 43.4; BUN 14; Creat 1.02; Hemoglobin 14.8; Platelets 150; Potassium 4.9; Sodium 138   Lipid Panel    Component Value Date/Time   CHOL 155 10/13/2015 1005   TRIG 186 (H) 10/13/2015 1005   HDL 49 10/13/2015 1005   CHOLHDL 3.2 10/13/2015 1005   VLDL 37 (H) 10/13/2015 1005   LDLCALC 69 10/13/2015 1005    Additional studies/ records that were reviewed today include:   08/2014 Cardiac Catheterization Procedure Note Procedure: Left Heart Cath, Selective Coronary Angiography, LV angiography, PTCA and stenting of the first diagonal  Indication: Exertional chest pain, CCS Class 3 angina  Procedural Details: The right wrist was prepped, draped, and anesthetized with 1% lidocaine. Using the modified Seldinger technique, a 5/6 French Slender sheath was introduced into the right radial artery. 3 mg of verapamil was administered through the sheath, weight-based unfractionated heparin was administered intravenously. Standard Judkins catheters were used for selective coronary angiography and left ventriculography. Catheter exchanges were performed over an exchange length guidewire.  PROCEDURAL FINDINGS Hemodynamics: AO 132/59 LV 140/17  Coronary angiography: Coronary dominance: right  Left mainstem: The left main is patent. There is no obstructive disease. The vessel divides into the LAD, intermediate branch, and left circumflex.  Left anterior descending (LAD): The LAD is patent to the apex. The proximal LAD has diffuse irregularities. At the first septal perforator there is 30% stenosis. The mid and distal LAD are widely patent and the vessel wraps around the LV apex. The first diagonal is medium in caliber with a 95% focal stenosis.  Left circumflex (LCx): The left circumflex courses down the AV groove and is small in caliber.  There is an intermediate branch that is  totally occluded proximally just after the left main divides. This is a large branch with 2 major subbranches that fill from left to left collaterals and retrograde fill back to the proximal vessel.  Right coronary artery (RCA): This is a dominant vessel. The vessel is smooth throughout its course. The PDA, PLA, and acute marginal branches are all patent.  Left ventriculography: There is mild hypokinesis of the mid inferior wall. The LVEF is 60%. PCI Data: Vessel - diagonal/Segment - diagonal 1 Percent Stenosis (pre) 90 TIMI-flow 3 Stent 2.25x12 mm Resolute DES Percent Stenosis (post) 0 TIMI-flow (post) 3  Estimated Blood Loss: minimal  Final Conclusions:  1. 2 vessel CAD with severe diagonal stenosis and total occlusion of the ramus intermedius 2. Widely patent LAD and RCA 3. Preserved LV systolic function 4. Successful PCI of the diagonal with a DES platform 5. Unable to cross total ramus occlusion   Recommendations:  DAPT With ASA and plavix x 12 months. Initiate medical therapy. If angina persists would refer for CTO-PCI. Films reviewed with Dr Irish Lack.    02/24/15 Coronary/Bypass Graft CTO Intervention  Coronary/Graft Atherectomy  Conclusion   Ramus lesion, 100% stenosed. There is a 0% residual stenosis post intervention with overlapping Synergy drug-eluting stents. 2.5 x 38 mm and 3.0 x 12 mm.   Continue dual anti-platelet therapy for at least a year and likely longer given the length of this occlusion.  He'll need aggressive secondary prevention as well. He'll be watched overnight.    2D ECHO: 05/30/2016 LV EF: 60% -  65% Study Conclusions - Left ventricle: The cavity size was normal. Posterior wall thickness was increased in a pattern of mild LVH. Systolic function was normal. The estimated ejection fraction was in the range of 60% to 65%. Wall motion was normal; there were no regional wall motion abnormalities. Doppler parameters  are consistent with abnormal left ventricular relaxation (grade 1 diastolic dysfunction). Doppler parameters are consistent with indeterminate ventricular filling pressure. - Aortic valve: Transvalvular velocity was within the normal range. There was no stenosis. There was no regurgitation. - Mitral valve: Transvalvular velocity was within the normal range. There was no evidence for stenosis. There was no regurgitation. - Right ventricle: The cavity size was normal. Wall thickness was normal. Systolic function was normal. - Atrial septum: No defect or patent foramen ovale was identified. - Tricuspid valve: There was no regurgitation.    ASSESSMENT & PLAN:   Chest pain/SOB: BNP normal. 2D ECHO with normal LV function, G1DD and no pulm HTN. Much better on Ranexa. Will continue to monitor this.   CAD: continue ASA/plavix, statin and BB. Ranexa has improved the CP and SOB, but still having minor chest pains that is more of a nuisance than anything. Continue to monitor for now. Provided reassurance.   HLD: LDL at goal @ 69 in 10/2015. Continue Crestor  HTN: BP well controlled currently.   Trifascicular block: HR 59. Will continue to monitor. Continue lopressor 12.5 mg daily for now. We discussed how eventually he may require a pacemaker.   Transiently Elevated HR: noted on his I watch twice. Asymptomatic. Wants to hold off on a cardiac monitor for now.    Medication Adjustments/Labs and Tests Ordered: Current medicines are reviewed at length with the patient today.  Concerns regarding medicines are outlined above.  Medication changes, Labs and Tests ordered today are listed in the Patient Instructions below. Patient Instructions  Medication Instructions:  Your physician recommends  that you continue on your current medications as directed. Please refer to the Current Medication list given to you today.   Labwork: None ordered  Testing/Procedures: None  ordered  Follow-Up: Your physician recommends that you schedule a follow-up appointment in: 3 MONTHS WITH DR. VARANASI   Any Other Special Instructions Will Be Listed Below (If Applicable).     If you need a refill on your cardiac medications before your next appointment, please call your pharmacy.      Signed, Angelena Form, PA-C  06/01/2016 9:04 AM    [facility] [place], [place], [place]  [address] Phone: [phone]; Fax: [phone]

## 2016-05-30 ENCOUNTER — Ambulatory Visit (HOSPITAL_COMMUNITY)
Admission: RE | Admit: 2016-05-30 | Discharge: 2016-05-30 | Disposition: A | Discharge status: Home / Self Care | Payer: Medicare Other | Source: Ambulatory Visit | Attending: Physician Assistant | Admitting: Physician Assistant

## 2016-05-30 DIAGNOSIS — R079 Chest pain, unspecified: Secondary | ICD-10-CM | POA: Insufficient documentation

## 2016-05-30 DIAGNOSIS — Z6832 Body mass index (BMI) 32.0-32.9, adult: Secondary | ICD-10-CM | POA: Insufficient documentation

## 2016-05-30 DIAGNOSIS — I209 Angina pectoris, unspecified: Secondary | ICD-10-CM | POA: Insufficient documentation

## 2016-05-30 DIAGNOSIS — R0609 Other forms of dyspnea: Secondary | ICD-10-CM | POA: Diagnosis not present

## 2016-05-30 LAB — ECHOCARDIOGRAM COMPLETE
CHL CUP DOP CALC LVOT VTI: 29.3 cm
E decel time: 246 msec
E/e' ratio: 10.4
FS: 38 % (ref 28–44)
IVS/LV PW RATIO, ED: 0.83
LA ID, A-P, ES: 37 mm
LA diam index: 1.62 cm/m2
LA vol A4C: 50.1 ml
LA vol index: 25.4 mL/m2
LA vol: 58.2 mL
LDCA: 1.77 cm2
LEFT ATRIUM END SYS DIAM: 37 mm
LV E/e' medial: 10.4
LV PW d: 11.8 mm — AB (ref 0.6–1.1)
LV TDI E'LATERAL: 9
LV TDI E'MEDIAL: 7.32
LV e' LATERAL: 9 cm/s
LVEEAVG: 10.4
LVOT peak grad rest: 5 mmHg
LVOTD: 15 mm
LVOTPV: 116 cm/s
LVOTSV: 52 mL
MV Dec: 246
MV pk E vel: 93.6 m/s
MVPG: 4 mmHg
MVPKAVEL: 113 m/s
RV LATERAL S' VELOCITY: 13.9 cm/s
RV TAPSE: 27.2 mm

## 2016-05-30 NOTE — Progress Notes (Signed)
  Echocardiogram 2D Echocardiogram has been performed.  Luis [name] 05/30/2016, 5:02 PM

## 2016-06-01 ENCOUNTER — Encounter: Payer: Self-pay | Admitting: Physician Assistant

## 2016-06-01 ENCOUNTER — Ambulatory Visit (INDEPENDENT_AMBULATORY_CARE_PROVIDER_SITE_OTHER): Payer: Medicare Other | Admitting: Physician Assistant

## 2016-06-01 ENCOUNTER — Encounter (INDEPENDENT_AMBULATORY_CARE_PROVIDER_SITE_OTHER): Payer: Self-pay

## 2016-06-01 VITALS — BP 136/68 | HR 62 | Ht 72.0 in | Wt 237.8 lb

## 2016-06-01 DIAGNOSIS — I10 Essential (primary) hypertension: Secondary | ICD-10-CM

## 2016-06-01 DIAGNOSIS — I25119 Atherosclerotic heart disease of native coronary artery with unspecified angina pectoris: Secondary | ICD-10-CM

## 2016-06-01 DIAGNOSIS — E785 Hyperlipidemia, unspecified: Secondary | ICD-10-CM

## 2016-06-01 NOTE — Patient Instructions (Signed)
Medication Instructions:  Your physician recommends that you continue on your current medications as directed. Please refer to the Current Medication list given to you today.   Labwork: None ordered  Testing/Procedures: None ordered  Follow-Up: Your physician recommends that you schedule a follow-up appointment in: 3 MONTHS WITH DR. VARANASI   Any Other Special Instructions Will Be Listed Below (If Applicable).     If you need a refill on your cardiac medications before your next appointment, please call your pharmacy.   

## 2016-06-11 ENCOUNTER — Other Ambulatory Visit: Payer: Self-pay | Admitting: Physician Assistant

## 2016-08-17 NOTE — Progress Notes (Signed)
Patient ID: Luis [name], male   DOB: [birth year], 76 y.o.   MRN: [id number]     Cardiology Office Note   Date:  08/18/2016   ID:  [name], DOB 28-Dec-1940, MRN [id number]  PCP:  [name], MD    Chief Complaint  Patient presents with  . 3 month f/u  f/u CAD   Wt Readings from Last 3 Encounters:  08/18/16 238 lb 6.4 oz (108.1 kg)  06/01/16 237 lb 12.8 oz (107.9 kg)  05/16/16 236 lb (107 kg)       History of Present Illness: Luis [name] is a 76 y.o. male  with history of CAD (s/p DES to diagonal 08/2014, s/p recent CTO PCI with overlapping DES to ramus 02/2015), HTN, HLD, prostate CA, and pulmonary fibrosis due to sarcoidosis. He has history of cath in 08/2014 showing 2 vessel CAD with severe diagonal stenosis and total occlusion of ramus intermedius. They were unable to cross total ramus occlusion at the time. He underwent successful PCI of the diagonal stenosis with DES. He was placed on aspirin and Plavix. No relief with Ranexa or Imdur. He had CTO PCI of the ramus due to increasing shortness of breath with exertion and the chest pain. He underwent this procedure on 02/24/15 - the 100% stenosis in the ramus was treated with 2 overlapping DES (Synergy DES 2.4x38 mm and 3.0 x 70mm).   No chest pain or SHOB until Dec 2017.  Walks nearly daily for exercise.  He hikes as well in the woods.  Weight has increased with a recent vacation.  Was recently in [place].  No bleeding problems.  Some easy bruising.  In Dec 2017, he had an episode of angina relieved with NTG.  Ranexa was started.  Since then, he has not had any significant chest pain. He wants to try being off of Ranexa.  He has not used any SL NTG.  He also wants to try coming off of the metoprolol.       Past Medical History:  Diagnosis Date  . Arthritis    "mid back; hands; knees" (02/24/2015)  . Basal cell carcinoma    left shoulder; mid chest; right eyelid (02/24/2015)  . CAD (coronary artery disease)    a.  08/2014 Cath/PCI: LM nl, LAD 30p, D1 95 (2.25x12 Resolute Integrity DES), LCX small, RI 100 (attempted PCI) - branches fill via L->L collats, RCA dominant, nl, RPDA/PLA nl, EF 60^. b. 02/24/2015 PCI CTO of Ramus DES x2.  . Chronic bronchitis (HCC)    hx  . Diverticulosis 05/2005  . Elevated lipase   . Fuchs' corneal dystrophy   . History of adenomatous polyp of colon 05/2005   8 mm adenoma  . History of blood transfusion    "related to some of my surgeries"  . Hyperlipidemia   . Hypertension   . Prostate cancer ([place])    S/P seed implant  . Sarcoidosis ([place])   . Thrombocytopenia ([place])    a. Noted on prior labs, unclear of what w/u done.  . Trifascicular block     Past Surgical History:  Procedure Laterality Date  . BASAL CELL CARCINOMA EXCISION Left    shoulder  . CARDIAC CATHETERIZATION N/A 02/24/2015   Procedure: Coronary/Bypass Graft CTO Intervention;  Surgeon: Jettie Booze, MD;  Location: [place] [facility];  Service: Cardiovascular;  Laterality: N/A;  . CARDIAC CATHETERIZATION  02/24/2015   Procedure: Coronary/Graft Atherectomy;  Surgeon: Jettie Booze, MD;  Location: [place] [facility];  Service: Cardiovascular;;  . CATARACT EXTRACTION W/ INTRAOCULAR LENS  IMPLANT, BILATERAL Bilateral ~ 2011-2012  . COLONOSCOPY W/ POLYPECTOMY  06/08/2005 and 10/18/2010   8 mm adenoma 2006, none 2012. Diverticulosis and internal hemorrhoids.  . CORNEAL TRANSPLANT Bilateral ~ 2011-2012   "@ same time as cataract OR"  . CORONARY ANGIOPLASTY WITH STENT PLACEMENT  08/2014; 02/24/2015   "1 stent + 1 stent"  . EYE SURGERY    . FINGER SURGERY Right 2014   "reattached middle finger"  . INSERTION PROSTATE RADIATION SEED  ~ 2011  . LAPAROSCOPIC CHOLECYSTECTOMY  2015  . LEFT HEART CATHETERIZATION WITH CORONARY ANGIOGRAM N/A 08/12/2014   Procedure: LEFT HEART CATHETERIZATION WITH CORONARY ANGIOGRAM;  Surgeon: Blane Ohara, MD;  Location: [facility] CATH LAB;  Service: Cardiovascular;  Laterality: N/A;   . LYMPH NODE BIOPSY     mid chest  . MOHS SURGERY Right ~ 2011   "eyelid; for basal cell"  . THOROCOTOMY WITH LOBECTOMY Right ~ 1991   partial removal right lung  . TONSILLECTOMY  ~ 1950     Current Outpatient Prescriptions  Medication Sig Dispense Refill  . azelastine (ASTELIN) 0.1 % nasal spray Place 2 sprays into both nostrils 2 (two) times daily.  0  . Cholecalciferol (VITAMIN D3) 3000 units TABS Take 3,000 mg by mouth daily.    . clopidogrel (PLAVIX) 75 MG tablet TAKE 1 TABLET BY MOUTH  DAILY WITH BREAKFAST 90 tablet 3  . clotrimazole-betamethasone (LOTRISONE) cream Apply 1 application topically 2 (two) times daily as needed (rash).     . fluocinonide ointment (LIDEX) 6.06 % Apply 1 application topically daily as needed (Rash).     . fluticasone (FLONASE) 50 MCG/ACT nasal spray Place 2 sprays into both nostrils daily.  0  . Glucosamine Sulfate 500 MG TABS Take 1 tablet by mouth daily.     . metoprolol tartrate (LOPRESSOR) 25 MG tablet Take 0.5 tablets (12.5 mg total) by mouth 2 (two) times daily. 30 tablet 11  . Multiple Vitamin (MULTIVITAMIN WITH MINERALS) TABS tablet Take 1 tablet by mouth every evening.    . nitroGLYCERIN (NITROSTAT) 0.4 MG SL tablet Place 0.4 mg under the tongue every 5 (five) minutes as needed for chest pain. X 3 doses    . Omega-3 Fatty Acids (FISH OIL) 1200 MG CAPS Take 1,400 mg by mouth daily.     [name] RANEXA 500 MG 12 hr tablet TAKE 1 TABLET (500 MG TOTAL) BY MOUTH 2 (TWO) TIMES DAILY. 60 tablet 11  . rosuvastatin (CRESTOR) 20 MG tablet Take 1 tablet by mouth  daily 90 tablet 3  . sildenafil (REVATIO) 20 MG tablet Take 60-120 mg by mouth as needed (erectile dysfunction).    . silodosin (RAPAFLO) 8 MG CAPS capsule Take 8 mg by mouth as needed (When he takes trips (hasn't used recently)).    . traMADol (ULTRAM) 50 MG tablet Take 50-100 mg by mouth See admin instructions. Take 2 tablets (100 mg) every morning, Take 1 tablet (50 mg ) every evening. Take 1 tablet  (50 mg) in the afternoon if needed for pain relief.     No current facility-administered medications for this visit.     Allergies:   Patient has no known allergies.    Social History:  The patient  reports that he has been smoking Cigars.  He has been smoking about 0.00 packs per day for the past 59.00 years. He has never used smokeless tobacco. He reports that he drinks about 1.2 oz  of alcohol per week . He reports that he does not use drugs.   Family History:  The patient's family history includes Coronary artery disease (age of onset: 62) in his sister; Heart attack in his sister; Heart disease (age of onset: 103) in his father.    ROS:  Please see the history of present illness.   Otherwise, review of systems are positive for joint pains (left hip).   All other systems are reviewed and negative.    PHYSICAL EXAM: VS:  BP 130/60   Pulse 70   Ht 5\' 11"  (1.803 m)   Wt 238 lb 6.4 oz (108.1 kg)   SpO2 97%   BMI 33.25 kg/m  , BMI Body mass index is 33.25 kg/m. GEN: Well nourished, well developed, in no acute distress  HEENT: normal  Neck: no JVD, carotid bruits, or masses Cardiac: RRR; no murmurs, rubs, or gallops,no edema  Respiratory:  clear to auscultation bilaterally, normal work of breathing GI: soft, nontender, nondistended, + BS MS: no deformity or atrophy  Skin: warm and dry, no rash Neuro:  Strength and sensation are intact Psych: euthymic mood, full affect    Recent Labs: 10/13/2015: ALT 27 05/16/2016: Brain Natriuretic Peptide 43.4; BUN 14; Creat 1.02; Hemoglobin 14.8; Platelets 150; Potassium 4.9; Sodium 138   Lipid Panel    Component Value Date/Time   CHOL 155 10/13/2015 1005   TRIG 186 (H) 10/13/2015 1005   HDL 49 10/13/2015 1005   CHOLHDL 3.2 10/13/2015 1005   VLDL 37 (H) 10/13/2015 1005   LDLCALC 69 10/13/2015 1005     Other studies Reviewed: Additional studies/ records that were reviewed today with results demonstrating: prior cath  records.   ASSESSMENT AND PLAN:  1. CAD: s/p multiple PCI. Doing well. Angina resolved. He has not felt the need to take a NTG. He would like to wean of of Ranexa.  OK to try. If he does ok without angina for a week, he can try weaning off metoprolol.  He will restart either if he has pain.  No NTG use since the last visit. 2. Obesity: We spoke about trying to lose weight through diet and exercise. In October 2017, stopped aspirin and continues clopidogrel.  Took prednisone a few weeks ago which cause some weight gain.  H/o sarcoid. 3. Hyperlipidemia: Tolerating Crestor. Lipids well controlled in May 2017.  To be checked in May 2018. 4. Dyspnea on exertion: Occurs with hiking inclines.  This should improve with more exercise. BNP was normal in early 2016. He is walking regularly.  He has had a partial lung resection in the past for sarcoid.  5. HTN: Controlled outside of MDs office and today.  He was on losartan in the past.  BP came down when he went back to work, so he stopped his medicines.  OK to try to come off of metoprolol.    Current medicines are reviewed at length with the patient today.  The patient concerns regarding his medicines were addressed.  The following changes have been made:  No change  Labs/ tests ordered today include:  No orders of the defined types were placed in this encounter.   Recommend 150 minutes/week of aerobic exercise Low fat, low carb, high fiber diet recommended  Disposition:   FU in 1 year   Signed, Larae Grooms, MD  08/18/2016 8:34 AM    [facility] [place], [place], [place]  [address] Phone: [phone]; Fax: [phone]

## 2016-08-18 ENCOUNTER — Ambulatory Visit (INDEPENDENT_AMBULATORY_CARE_PROVIDER_SITE_OTHER): Payer: Medicare Other | Admitting: Interventional Cardiology

## 2016-08-18 ENCOUNTER — Encounter: Payer: Self-pay | Admitting: Interventional Cardiology

## 2016-08-18 ENCOUNTER — Encounter (INDEPENDENT_AMBULATORY_CARE_PROVIDER_SITE_OTHER): Payer: Self-pay

## 2016-08-18 VITALS — BP 130/60 | HR 70 | Ht 71.0 in | Wt 238.4 lb

## 2016-08-18 DIAGNOSIS — I10 Essential (primary) hypertension: Secondary | ICD-10-CM

## 2016-08-18 DIAGNOSIS — E785 Hyperlipidemia, unspecified: Secondary | ICD-10-CM

## 2016-08-18 DIAGNOSIS — R0609 Other forms of dyspnea: Secondary | ICD-10-CM

## 2016-08-18 DIAGNOSIS — I25118 Atherosclerotic heart disease of native coronary artery with other forms of angina pectoris: Secondary | ICD-10-CM

## 2016-08-18 DIAGNOSIS — E669 Obesity, unspecified: Secondary | ICD-10-CM | POA: Diagnosis not present

## 2016-08-18 NOTE — Patient Instructions (Addendum)
**Note De-Identified Princesa Willig Obfuscation** Medication Instructions:  Start to wean yourself off of Renexa-if you develop chest pain please resume taking. A few weeks after you stop Ranexa you can stop taking Metoprolol.  Please call the office at [phone] to let us know if you are successful at stopping Renexa and Metoprolol. All other medications remain the same.  Labwork: None  Testing/Procedures: None  Follow-Up: Your physician wants you to follow-up in: 1 year. You will receive a reminder letter in the mail two months in advance. If you don't receive a letter, please call our office to schedule the follow-up appointment.     If you need a refill on your cardiac medications before your next appointment, please call your pharmacy.

## 2016-11-28 ENCOUNTER — Telehealth: Payer: Self-pay | Admitting: Interventional Cardiology

## 2016-11-28 NOTE — Telephone Encounter (Signed)
Patient calling complaining of not pain but "tingling" in both of his arms last night while he was laying down. He states that the tingling would go away when he got up and moved around. He states that he was up and down all night long and was unable to sleep. Patient denies having any pain or tingling in his arms at this time and states that it only happenened when he was laying down trying to sleep last night. He states that he also has diarrhea. Patient denies having any chest pain, SOB, lightheadedness, dizziness, N/V, sweating, or any other symptoms. Patient advised to follow up with his PCP. Patient states that he has an appointment with them today. ER precautions reviewed with the patient. Patient verbalized understanding.

## 2016-11-28 NOTE — Telephone Encounter (Signed)
New Message:   Pt said he had some issues last night. His arm were tingling,fel like it had fallen asleep.t  when he laid down,when he would get up it stopped. This went on all night long,he could not sleep. This is the third time this have happen.If not at [facility] call [phone].

## 2016-12-20 ENCOUNTER — Telehealth: Payer: Self-pay | Admitting: Interventional Cardiology

## 2016-12-20 DIAGNOSIS — R0989 Other specified symptoms and signs involving the circulatory and respiratory systems: Secondary | ICD-10-CM

## 2016-12-20 NOTE — Telephone Encounter (Signed)
Patient requesting call, would not go into detail. thanks

## 2016-12-20 NOTE — Telephone Encounter (Signed)
Patient states that he had his annual physical a couple of weeks ago and was told that he should contact his cardiologist about having a carotid U/S because on exam they could hear a possible blockage.

## 2016-12-21 NOTE — Telephone Encounter (Signed)
OK to schedule carotid DOppler for bruit.

## 2016-12-21 NOTE — Telephone Encounter (Signed)
Called and made patient aware that carotid doppler was ordered and that the patient would be contacted soon to schedule the appointment. Patient verbalized understanding and thanked me for the call.

## 2017-01-01 ENCOUNTER — Ambulatory Visit (HOSPITAL_COMMUNITY)
Admission: RE | Admit: 2017-01-01 | Discharge: 2017-01-01 | Disposition: A | Discharge status: Home / Self Care | Payer: Medicare Other | Source: Ambulatory Visit | Attending: Cardiology | Admitting: Cardiology

## 2017-01-01 DIAGNOSIS — R0989 Other specified symptoms and signs involving the circulatory and respiratory systems: Secondary | ICD-10-CM | POA: Diagnosis not present

## 2017-01-01 DIAGNOSIS — I6523 Occlusion and stenosis of bilateral carotid arteries: Secondary | ICD-10-CM

## 2017-01-05 ENCOUNTER — Telehealth: Payer: Self-pay | Admitting: Interventional Cardiology

## 2017-01-05 DIAGNOSIS — I739 Peripheral vascular disease, unspecified: Principal | ICD-10-CM

## 2017-01-05 DIAGNOSIS — I779 Disorder of arteries and arterioles, unspecified: Secondary | ICD-10-CM

## 2017-01-05 NOTE — Telephone Encounter (Signed)
Patient returning call for results 

## 2017-01-05 NOTE — Telephone Encounter (Signed)
Patient made aware of results. Patient verbalizes understanding. Repeat Carotid ordered for 1 year.

## 2017-01-05 NOTE — Telephone Encounter (Signed)
-----   Message from Jettie Booze, MD sent at 01/05/2017 12:45 AM EDT ----- Moderate right sided disease.  Repeat study in one year.

## 2017-02-18 ENCOUNTER — Other Ambulatory Visit: Payer: Self-pay | Admitting: Interventional Cardiology

## 2017-02-22 ENCOUNTER — Other Ambulatory Visit: Payer: Self-pay | Admitting: Nurse Practitioner

## 2017-02-23 ENCOUNTER — Other Ambulatory Visit: Payer: Self-pay

## 2017-02-23 MED ORDER — METOPROLOL TARTRATE 25 MG PO TABS: 12.5000 mg | ORAL_TABLET | Freq: Two times a day (BID) | ORAL | 3 refills | Status: DC

## 2017-03-09 ENCOUNTER — Emergency Department (HOSPITAL_COMMUNITY)
Admission: EM | Admit: 2017-03-09 | Discharge: 2017-03-09 | Disposition: A | Discharge status: Home / Self Care | Payer: Medicare Other | Attending: Emergency Medicine | Admitting: Emergency Medicine

## 2017-03-09 ENCOUNTER — Encounter (HOSPITAL_COMMUNITY): Payer: Self-pay

## 2017-03-09 ENCOUNTER — Telehealth: Payer: Self-pay

## 2017-03-09 DIAGNOSIS — Z8546 Personal history of malignant neoplasm of prostate: Secondary | ICD-10-CM | POA: Diagnosis not present

## 2017-03-09 DIAGNOSIS — I10 Essential (primary) hypertension: Secondary | ICD-10-CM | POA: Insufficient documentation

## 2017-03-09 DIAGNOSIS — K625 Hemorrhage of anus and rectum: Secondary | ICD-10-CM | POA: Diagnosis present

## 2017-03-09 DIAGNOSIS — I2510 Atherosclerotic heart disease of native coronary artery without angina pectoris: Secondary | ICD-10-CM | POA: Diagnosis not present

## 2017-03-09 DIAGNOSIS — K922 Gastrointestinal hemorrhage, unspecified: Secondary | ICD-10-CM | POA: Diagnosis not present

## 2017-03-09 DIAGNOSIS — Z85828 Personal history of other malignant neoplasm of skin: Secondary | ICD-10-CM | POA: Insufficient documentation

## 2017-03-09 DIAGNOSIS — F17210 Nicotine dependence, cigarettes, uncomplicated: Secondary | ICD-10-CM | POA: Insufficient documentation

## 2017-03-09 DIAGNOSIS — Z7902 Long term (current) use of antithrombotics/antiplatelets: Secondary | ICD-10-CM | POA: Insufficient documentation

## 2017-03-09 DIAGNOSIS — D500 Iron deficiency anemia secondary to blood loss (chronic): Secondary | ICD-10-CM | POA: Diagnosis not present

## 2017-03-09 LAB — COMPREHENSIVE METABOLIC PANEL
ALBUMIN: 3.7 g/dL (ref 3.5–5.0)
ALK PHOS: 76 U/L (ref 38–126)
ALT: 26 U/L (ref 17–63)
ANION GAP: 7 (ref 5–15)
AST: 32 U/L (ref 15–41)
BILIRUBIN TOTAL: 0.4 mg/dL (ref 0.3–1.2)
BUN: 19 mg/dL (ref 6–20)
CO2: 25 mmol/L (ref 22–32)
Calcium: 8.8 mg/dL — ABNORMAL LOW (ref 8.9–10.3)
Chloride: 106 mmol/L (ref 101–111)
Creatinine, Ser: 0.83 mg/dL (ref 0.61–1.24)
GFR calc non Af Amer: >60 mL/min (ref >60)
GLUCOSE: 123 mg/dL — AB (ref 65–99)
POTASSIUM: 4.4 mmol/L (ref 3.5–5.1)
Sodium: 138 mmol/L (ref 135–145)
TOTAL PROTEIN: 6.5 g/dL (ref 6.5–8.1)

## 2017-03-09 LAB — CBC
HCT: 35 % — ABNORMAL LOW (ref 39.0–52.0)
HEMATOCRIT: 36.8 % — AB (ref 39.0–52.0)
HEMOGLOBIN: 11.4 g/dL — AB (ref 13.0–17.0)
HEMOGLOBIN: 12 g/dL — AB (ref 13.0–17.0)
MCH: 27.5 pg (ref 26.0–34.0)
MCH: 27.5 pg (ref 26.0–34.0)
MCHC: 32.6 g/dL (ref 30.0–36.0)
MCHC: 32.6 g/dL (ref 30.0–36.0)
MCV: 84.3 fL (ref 78.0–100.0)
MCV: 84.4 fL (ref 78.0–100.0)
Platelets: 150 10*3/uL (ref 150–400)
Platelets: 163 10*3/uL (ref 150–400)
RBC: 4.15 MIL/uL — AB (ref 4.22–5.81)
RBC: 4.36 MIL/uL (ref 4.22–5.81)
RDW: 16 % — ABNORMAL HIGH (ref 11.5–15.5)
RDW: 15.9 % — AB (ref 11.5–15.5)
WBC: 5.8 10*3/uL (ref 4.0–10.5)
WBC: 5.4 10*3/uL (ref 4.0–10.5)

## 2017-03-09 LAB — POC OCCULT BLOOD, ED: Fecal Occult Bld: POSITIVE — AB

## 2017-03-09 NOTE — Telephone Encounter (Signed)
Call from PCP. Patient has presented to her clinic with complaints of bright red blood per rectum. Hgb today at her office is 7.8. The patient reports he last had a bloody bowel movement about 2 hours ago. He is On Plavix. CAD with stent. Patient will be asked to present to [facility] long ED for evaluation by ED provider. GI will be consulted if ED doctor requests.

## 2017-03-09 NOTE — ED Triage Notes (Signed)
Patient having bright red blood in his stool x 2 days. Patient went to PCP this AM and was told to come to the ED because his blood count was too low.

## 2017-03-09 NOTE — ED Provider Notes (Signed)
[facility] DEPT Provider Note   CSN: [id number] Arrival date & time: 03/09/17  1525     History   Chief Complaint Chief Complaint  Patient presents with  . Rectal Bleeding  . Abnormal Lab    HPI Luis [name] is a 76 y.o. male.  HPI  76 year old male with a history of internal hemorrhoids, diverticulosis who presents to the emergency department with hematochezia that has been ongoing for several days. He reports seeing his primary care provider today who obtained a point-of-care hemoglobin of 7.1. The patient denied any abdominal pain, fatigue, shortness of breath. Reports that is been having loose stools for the past several weeks which his primary care provider is been seeing him for. No alleviating or aggravating factors. Patient denies any other physical complaints.  Past Medical History:  Diagnosis Date  . Arthritis    "mid back; hands; knees" (02/24/2015)  . Basal cell carcinoma    left shoulder; mid chest; right eyelid (02/24/2015)  . CAD (coronary artery disease)    a. 08/2014 Cath/PCI: LM nl, LAD 30p, D1 95 (2.25x12 Resolute Integrity DES), LCX small, RI 100 (attempted PCI) - branches fill via L->L collats, RCA dominant, nl, RPDA/PLA nl, EF 60^. b. 02/24/2015 PCI CTO of Ramus DES x2.  . Chronic bronchitis (HCC)    hx  . Diverticulosis 05/2005  . Elevated lipase   . Fuchs' corneal dystrophy   . History of adenomatous polyp of colon 05/2005   8 mm adenoma  . History of blood transfusion    "related to some of my surgeries"  . Hyperlipidemia   . Hypertension   . Prostate cancer ([place])    S/P seed implant  . Sarcoidosis   . Thrombocytopenia ([place])    a. Noted on prior labs, unclear of what w/u done.  . Trifascicular block     Patient Active Problem List   Diagnosis Date Noted  . DOE (dyspnea on exertion) 03/30/2015  . Obesity (BMI 30-39.9) 03/30/2015  . Trifascicular block   . Angina pectoris ([place]) 02/24/2015  . Unstable angina ([place]) 08/13/2014  . CAD  (coronary artery disease) 08/13/2014  . Essential hypertension 08/13/2014  . Hyperlipidemia 08/13/2014  . Pulmonary fibrosis ([place]) 08/13/2014  . Sarcoidosis   . Ischemic chest pain 08/12/2014  . Syncope 01/29/2012    Past Surgical History:  Procedure Laterality Date  . BASAL CELL CARCINOMA EXCISION Left    shoulder  . CARDIAC CATHETERIZATION N/A 02/24/2015   Procedure: Coronary/Bypass Graft CTO Intervention;  Surgeon: Jettie Booze, MD;  Location: [facility];  Service: Cardiovascular;  Laterality: N/A;  . CARDIAC CATHETERIZATION  02/24/2015   Procedure: Coronary/Graft Atherectomy;  Surgeon: Jettie Booze, MD;  Location: [facility];  Service: Cardiovascular;;  . CATARACT EXTRACTION W/ INTRAOCULAR LENS  IMPLANT, BILATERAL Bilateral ~ 2011-2012  . COLONOSCOPY W/ POLYPECTOMY  06/08/2005 and 10/18/2010   8 mm adenoma 2006, none 2012. Diverticulosis and internal hemorrhoids.  . CORNEAL TRANSPLANT Bilateral ~ 2011-2012   "@ same time as cataract OR"  . CORONARY ANGIOPLASTY WITH STENT PLACEMENT  08/2014; 02/24/2015   "1 stent + 1 stent"  . EYE SURGERY    . FINGER SURGERY Right 2014   "reattached middle finger"  . INSERTION PROSTATE RADIATION SEED  ~ 2011  . LAPAROSCOPIC CHOLECYSTECTOMY  2015  . LEFT HEART CATHETERIZATION WITH CORONARY ANGIOGRAM N/A 08/12/2014   Procedure: LEFT HEART CATHETERIZATION WITH CORONARY ANGIOGRAM;  Surgeon: Blane Ohara, MD;  Location: [facility] CATH LAB;  Service: Cardiovascular;  Laterality: N/A;  . LYMPH NODE BIOPSY     mid chest  . MOHS SURGERY Right ~ 2011   "eyelid; for basal cell"  . THOROCOTOMY WITH LOBECTOMY Right ~ 1991   partial removal right lung  . TONSILLECTOMY  ~ 1950       Home Medications    Prior to Admission medications   Medication Sig Start Date End Date Taking? Authorizing Provider  Cholecalciferol (VITAMIN D3) 3000 units TABS Take 3,000 mg by mouth daily.   Yes [provider]  clopidogrel (PLAVIX) 75  MG tablet TAKE 1 TABLET BY MOUTH  DAILY WITH BREAKFAST 03/14/16  Yes Leonie Man, MD  clotrimazole-betamethasone (LOTRISONE) cream Apply 1 application topically 2 (two) times daily as needed (rash).    Yes [provider]  fluocinonide ointment (LIDEX) 5.80 % Apply 1 application topically daily as needed (Rash).    Yes [provider]  Glucosamine Sulfate 500 MG TABS Take 1 tablet by mouth daily.    Yes [provider]  metoprolol tartrate (LOPRESSOR) 25 MG tablet Take 0.5 tablets (12.5 mg total) by mouth 2 (two) times daily. 02/23/17  Yes Rogelia Mire, NP  Multiple Vitamin (MULTIVITAMIN WITH MINERALS) TABS tablet Take 1 tablet by mouth every evening.   Yes [provider]  Omega-3 Fatty Acids (FISH OIL) 1200 MG CAPS Take 1,400 mg by mouth daily.    Yes [provider]  rosuvastatin (CRESTOR) 20 MG tablet Take 1 tablet (20 mg total) by mouth daily. 02/19/17  Yes Jettie Booze, MD  traMADol (ULTRAM) 50 MG tablet Take 50-100 mg by mouth See admin instructions. Take 2 tablets (100 mg) every morning, Take 1 tablet (50 mg ) every evening. Take 1 tablet (50 mg) in the afternoon if needed for pain relief.   Yes [provider]  fluticasone (FLONASE) 50 MCG/ACT nasal spray Place 2 sprays into both nostrils daily. 08/10/16   [provider]  nitroGLYCERIN (NITROSTAT) 0.4 MG SL tablet Place 0.4 mg under the tongue every 5 (five) minutes as needed for chest pain. X 3 doses    [provider]  RANEXA 500 MG 12 hr tablet TAKE 1 TABLET (500 MG TOTAL) BY MOUTH 2 (TWO) TIMES DAILY. Patient not taking: Reported on 03/09/2017 06/13/16   Eileen Stanford, PA-C    Family History Family History  Problem Relation Age of Onset  . Heart disease Father 22       Died from "hardening of the arteries" age 63  . Coronary artery disease Sister 38  . Heart attack Sister   . Colon cancer Neg Hx   . Throat cancer Neg Hx   . Pancreatic  cancer Neg Hx   . Prostate cancer Neg Hx   . Stroke Neg Hx   . Hypertension Neg Hx     Social History Social History  Substance Use Topics  . Smoking status: Current Some Day Smoker    Packs/day: 0.00    Years: 59.00    Types: Cigars  . Smokeless tobacco: Never Used     Comment: 02/24/2015 "quit cigarettes ~ 2000 ago but smokes cigars ionce/month"  . Alcohol use 1.2 oz/week    1 Glasses of wine, 1 Standard drinks or equivalent per week     Comment: Drinks maybe 1-2 drinks a week or less     Allergies   Patient has no known allergies.   Review of Systems Review of Systems All other systems are  reviewed and are negative for acute change except as noted in the HPI   Physical Exam Updated Vital Signs BP (!) 161/80 (BP Location: Left Arm)   Pulse 67   Temp 98.2 F (36.8 C) (Oral)   Resp 18   Ht 5\' 11"  (1.803 m)   Wt 104.3 kg (230 lb)   BMI 32.08 kg/m   Physical Exam  Constitutional: He is oriented to person, place, and time. He appears well-developed and well-nourished. No distress.  HENT:  Head: Normocephalic and atraumatic.  Nose: Nose normal.  Eyes: Pupils are equal, round, and reactive to light. Conjunctivae and EOM are normal. Right eye exhibits no discharge. Left eye exhibits no discharge. No scleral icterus.  Neck: Normal range of motion. Neck supple.  Cardiovascular: Normal rate and regular rhythm.  Exam reveals no gallop and no friction rub.   No murmur heard. Pulmonary/Chest: Effort normal and breath sounds normal. No stridor. No respiratory distress. He has no rales.  Abdominal: Soft. He exhibits no distension. There is no tenderness.  Genitourinary: Rectal exam shows internal hemorrhoid and guaiac positive stool. Prostate is enlarged. Prostate is not tender.  Musculoskeletal: He exhibits no edema or tenderness.  Neurological: He is alert and oriented to person, place, and time.  Skin: Skin is warm and dry. No rash noted. He is not diaphoretic. No  erythema.  Psychiatric: He has a normal mood and affect.  Vitals reviewed.    ED Treatments / Results  Labs (all labs ordered are listed, but only abnormal results are displayed) Labs Reviewed  COMPREHENSIVE METABOLIC PANEL - Abnormal; Notable for the following:       Result Value   Glucose, Bld 123 (*)    Calcium 8.8 (*)    All other components within normal limits  CBC - Abnormal; Notable for the following:    RBC 4.15 (*)    Hemoglobin 11.4 (*)    HCT 35.0 (*)    RDW 16.0 (*)    All other components within normal limits  CBC - Abnormal; Notable for the following:    Hemoglobin 12.0 (*)    HCT 36.8 (*)    RDW 15.9 (*)    All other components within normal limits  POC OCCULT BLOOD, ED - Abnormal; Notable for the following:    Fecal Occult Bld POSITIVE (*)    All other components within normal limits  TYPE AND SCREEN  ABO/RH    EKG  EKG Interpretation None       Radiology No results found.  Procedures Procedures (including critical care time)  Medications Ordered in ED Medications - No data to display   Initial Impression / Assessment and Plan / ED Course  I have reviewed the triage vital signs and the nursing notes.  Pertinent labs & imaging results that were available during my care of the patient were reviewed by me and considered in my medical decision making (see chart for details).     Hematochezia likely secondary to internal hemorrhoids versus diverticulosis. Abdomen benign. Labs revealed the hemoglobin is 11.4. Given the discrepancy with her primary care provider's hemoglobin, a repeat hemoglobin was obtained here and noted to be 12.  Labs grossly reassuring. No indication for advanced imaging at this time.  Recommended close follow-up with primary care provider and gastroenterologist.  The patient is safe for discharge with strict return precautions.   Final Clinical Impressions(s) / ED Diagnoses   Final diagnoses:  Lower GI bleed    Disposition: Discharge  Condition: Good  I have discussed the results, Dx and Tx plan with the patient who expressed understanding and agree(s) with the plan. Discharge instructions discussed at great length. The patient was given strict return precautions who verbalized understanding of the instructions. No further questions at time of discharge.    Discharge Medication List as of 03/09/2017 10:49 PM      Follow Up: [address], [place] [address] [phone]     Gastroenterology         Fatima Blank, MD 03/09/17 [phone]

## 2017-03-10 LAB — ABO/RH: ABO/RH(D): O POS

## 2017-03-10 LAB — TYPE AND SCREEN
ABO/RH(D): O POS
Antibody Screen: NEGATIVE

## 2017-03-15 ENCOUNTER — Telehealth: Payer: Self-pay | Admitting: Internal Medicine

## 2017-03-15 NOTE — Telephone Encounter (Signed)
Patient with intermittent diarrhea. He is advised he is ok to take OTC anti- diarrheal medications until his office visit next week.

## 2017-03-20 ENCOUNTER — Ambulatory Visit (INDEPENDENT_AMBULATORY_CARE_PROVIDER_SITE_OTHER): Payer: Medicare Other | Admitting: Physician Assistant

## 2017-03-20 ENCOUNTER — Encounter: Payer: Self-pay | Admitting: Physician Assistant

## 2017-03-20 ENCOUNTER — Other Ambulatory Visit: Payer: Medicare Other

## 2017-03-20 VITALS — BP 142/58 | HR 64 | Ht 71.0 in | Wt 238.0 lb

## 2017-03-20 DIAGNOSIS — K625 Hemorrhage of anus and rectum: Secondary | ICD-10-CM

## 2017-03-20 DIAGNOSIS — R197 Diarrhea, unspecified: Secondary | ICD-10-CM | POA: Diagnosis not present

## 2017-03-20 NOTE — Progress Notes (Addendum)
Chief Complaint: Change in bowel habits, rectal bleeding  HPI:  Luis [name] is a 76 year old Caucasian male with a past medical history as listed below, who was referred to me by Alonna Buckler* for a complaint of diarrhea and rectal bleeding .     Patient regualarly sees Dr. Carlean Purl and was seen for his last colonoscopy 10/18/10. He does have history of adenomatous colon polyp removed in 2006.  At that time, patient had a finding of moderate diverticulosis in the sigmoid colon, internal hemorrhoids and was otherwise normal. Repeat was recommended in 7 years.   Patient was seen in the ER 03/09/17 with a lower GI bleed , he had been sent from his primary care provider for hemoglobin checked and noted at 7.1. Patient described having loose stools for the past several weeks at that point. His hemoglobin was rechecked twice at the ER, the first time this was 11.4 and the second time this was 12.0. Fecal occult blood was positive. It was thought this was either related to hemorrhoids or diverticular bleed.   Today, the patient explains that he started with some rectal bleeding on 03/07/17, this was after a few weeks of diarrhea. Patient tells me he went to see his PCP and was told that his hemoglobin was low and told that he needed to go to the ER. After being there for 9 hours the patient had his hemoglobin rechecked and tells me that it was normal. He was told to go home.     Patient describes that he has had diarrhea "off and on" with the help of Imodium over the past few weeks. Patient tells that when he takes Imodium he will have no bowel movement for 2-3 days and then it will come back and be loose and he will have at least 3-4 loose bowel movements that day. He has seen no further rectal bleeding since the ER- for the past 1.5 weeks.  The patient tells me he has had no changes in medication, no changes in diet and has been around nobody else who has been sick. Accompanying symptoms include an  increase in gas with bowel movements.   Patient denies any further rectal bleeding, abdominal pain, heartburn, reflux or symptoms that awaken him at night.  Past Medical History:  Diagnosis Date  . Arthritis    "mid back; hands; knees" (02/24/2015)  . Basal cell carcinoma    left shoulder; mid chest; right eyelid (02/24/2015)  . CAD (coronary artery disease)    a. 08/2014 Cath/PCI: LM nl, LAD 30p, D1 95 (2.25x12 Resolute Integrity DES), LCX small, RI 100 (attempted PCI) - branches fill via L->L collats, RCA dominant, nl, RPDA/PLA nl, EF 60^. b. 02/24/2015 PCI CTO of Ramus DES x2.  . Chronic bronchitis (HCC)    hx  . Diverticulosis 05/2005  . Elevated lipase   . Fuchs' corneal dystrophy   . History of adenomatous polyp of colon 05/2005   8 mm adenoma  . History of blood transfusion    "related to some of my surgeries"  . Hyperlipidemia   . Hypertension   . Prostate cancer ([place])    S/P seed implant  . Sarcoidosis   . Thrombocytopenia ([place])    a. Noted on prior labs, unclear of what w/u done.  . Trifascicular block     Past Surgical History:  Procedure Laterality Date  . BASAL CELL CARCINOMA EXCISION Left    shoulder  . CARDIAC CATHETERIZATION N/A 02/24/2015   Procedure: Coronary/Bypass  Graft CTO Intervention;  Surgeon: Jettie Booze, MD;  Location: [facility];  Service: Cardiovascular;  Laterality: N/A;  . CARDIAC CATHETERIZATION  02/24/2015   Procedure: Coronary/Graft Atherectomy;  Surgeon: Jettie Booze, MD;  Location: [facility];  Service: Cardiovascular;;  . CATARACT EXTRACTION W/ INTRAOCULAR LENS  IMPLANT, BILATERAL Bilateral ~ 2011-2012  . COLONOSCOPY W/ POLYPECTOMY  06/08/2005 and 10/18/2010   8 mm adenoma 2006, none 2012. Diverticulosis and internal hemorrhoids.  . CORNEAL TRANSPLANT Bilateral ~ 2011-2012   "@ same time as cataract OR"  . CORONARY ANGIOPLASTY WITH STENT PLACEMENT  08/2014; 02/24/2015   "1 stent + 1 stent"  . EYE SURGERY    .  FINGER SURGERY Right 2014   "reattached middle finger"  . INSERTION PROSTATE RADIATION SEED  ~ 2011  . LAPAROSCOPIC CHOLECYSTECTOMY  2015  . LEFT HEART CATHETERIZATION WITH CORONARY ANGIOGRAM N/A 08/12/2014   Procedure: LEFT HEART CATHETERIZATION WITH CORONARY ANGIOGRAM;  Surgeon: Blane Ohara, MD;  Location: [facility] CATH LAB;  Service: Cardiovascular;  Laterality: N/A;  . LYMPH NODE BIOPSY     mid chest  . MOHS SURGERY Right ~ 2011   "eyelid; for basal cell"  . THOROCOTOMY WITH LOBECTOMY Right ~ 1991   partial removal right lung  . TONSILLECTOMY  ~ 1950    Current Outpatient Prescriptions  Medication Sig Dispense Refill  . Cholecalciferol (VITAMIN D3) 3000 units TABS Take 3,000 mg by mouth daily.    . clopidogrel (PLAVIX) 75 MG tablet TAKE 1 TABLET BY MOUTH  DAILY WITH BREAKFAST 90 tablet 3  . clotrimazole-betamethasone (LOTRISONE) cream Apply 1 application topically 2 (two) times daily as needed (rash).     . fluocinonide ointment (LIDEX) 2.95 % Apply 1 application topically daily as needed (Rash).     . Glucosamine Sulfate 500 MG TABS Take 1 tablet by mouth daily.     . metoprolol tartrate (LOPRESSOR) 25 MG tablet Take 0.5 tablets (12.5 mg total) by mouth 2 (two) times daily. 90 tablet 3  . Multiple Vitamin (MULTIVITAMIN WITH MINERALS) TABS tablet Take 1 tablet by mouth every evening.    . nitroGLYCERIN (NITROSTAT) 0.4 MG SL tablet Place 0.4 mg under the tongue every 5 (five) minutes as needed for chest pain. X 3 doses    . Omega-3 Fatty Acids (FISH OIL) 1200 MG CAPS Take 1,400 mg by mouth daily.     . rosuvastatin (CRESTOR) 20 MG tablet Take 1 tablet (20 mg total) by mouth daily. 90 tablet 1  . sildenafil (REVATIO) 20 MG tablet Take 20 mg by mouth as needed. Takes 60mg  prn    . silodosin (RAPAFLO) 8 MG CAPS capsule Take 8 mg by mouth as needed.    . traMADol (ULTRAM) 50 MG tablet Take 50-100 mg by mouth See admin instructions. Take 2 tablets (100 mg) every morning, Take 1 tablet (50 mg  ) every evening. Take 1 tablet (50 mg) in the afternoon if needed for pain relief.     No current facility-administered medications for this visit.     Allergies as of 03/20/2017  . (No Known Allergies)    Family History  Problem Relation Age of Onset  . Heart disease Father 83       Died from "hardening of the arteries" age 54  . Coronary artery disease Sister 7  . Heart attack Sister   . Colon cancer Neg Hx   . Throat cancer Neg Hx   . Pancreatic cancer Neg Hx   .  Prostate cancer Neg Hx   . Stroke Neg Hx   . Hypertension Neg Hx     Social History   Social History  . Marital status: Married    Spouse name: N/A  . Number of children: 2  . Years of education: N/A   Occupational History  . Unemployed    Social History Main Topics  . Smoking status: Current Some Day Smoker    Packs/day: 0.00    Years: 59.00    Types: Cigars  . Smokeless tobacco: Never Used     Comment: 02/24/2015 "quit cigarettes ~ 2000 ago but smokes cigars ionce/month"  . Alcohol use 1.2 oz/week    1 Glasses of wine, 1 Standard drinks or equivalent per week     Comment: Drinks maybe 1-2 drinks a week or less  . Drug use: No  . Sexual activity: Not on file   Other Topics Concern  . Not on file   Social History Narrative   Lives at home wife.      Review of Systems:    Constitutional: No weight loss, fever or chills Skin: No rash  Cardiovascular: No chest pain Respiratory: No SOB Gastrointestinal: See HPI and otherwise negative Genitourinary: No dysuria Neurological: No headache Musculoskeletal: No new muscle or joint pain Hematologic: No bruisng Psychiatric: No history of depression or anxiety   Physical Exam:  Vital signs: BP (!) 142/58   Pulse 64   Ht 5\' 11"  (1.803 m)   Wt 238 lb (108 kg)   BMI 33.19 kg/m   Constitutional:   Pleasant Caucasian male appears to be in NAD, Well developed, Well nourished, alert and cooperative Head:  Normocephalic and atraumatic. Eyes:   PEERL,  EOMI. No icterus. Conjunctiva pink. Ears:  Normal auditory acuity. Neck:  Supple Throat: Oral cavity and pharynx without inflammation, swelling or lesion.  Respiratory: Respirations even and unlabored. Lungs clear to auscultation bilaterally.   No wheezes, crackles, or rhonchi.  Cardiovascular: Normal S1, S2. No MRG. Regular rate and rhythm. No peripheral edema, cyanosis or pallor.  Gastrointestinal:  Soft, nondistended, nontender. No rebound or guarding. Normal bowel sounds. No appreciable masses or hepatomegaly. Rectal:  Not performed.  Msk:  Symmetrical without gross deformities. Without edema, no deformity or joint abnormality.  Neurologic:  Alert and  oriented x4;  grossly normal neurologically.  Skin:  Dry and intact without significant lesions or rashes. Psychiatric: Demonstrates good judgement and reason without abnormal affect or behaviors.  RELEVANT LABS AND IMAGING: CBC    Component Value Date/Time   WBC 5.4 03/09/2017 2134   RBC 4.36 03/09/2017 2134   HGB 12.0 (L) 03/09/2017 2134   HCT 36.8 (L) 03/09/2017 2134   PLT 163 03/09/2017 2134   MCV 84.4 03/09/2017 2134   MCH 27.5 03/09/2017 2134   MCHC 32.6 03/09/2017 2134   RDW 15.9 (H) 03/09/2017 2134   LYMPHSABS 0.6 (L) 03/02/2015 1558   MONOABS 0.6 03/02/2015 1558   EOSABS 0.2 03/02/2015 1558   BASOSABS 0.0 03/02/2015 1558    CMP     Component Value Date/Time   NA 138 03/09/2017 1629   K 4.4 03/09/2017 1629   CL 106 03/09/2017 1629   CO2 25 03/09/2017 1629   GLUCOSE 123 (H) 03/09/2017 1629   BUN 19 03/09/2017 1629   CREATININE 0.83 03/09/2017 1629   CREATININE 1.02 05/16/2016 0937   CALCIUM 8.8 (L) 03/09/2017 1629   PROT 6.5 03/09/2017 1629   ALBUMIN 3.7 03/09/2017 1629   AST 32 03/09/2017  1629   ALT 26 03/09/2017 1629   ALKPHOS 76 03/09/2017 1629   BILITOT 0.4 03/09/2017 1629   GFRNONAA >60 03/09/2017 1629   GFRAA >60 03/09/2017 1629    Assessment: 1. Diarrhea: 3-4 loose stools per day over the past  3-4 weeks, does stop with Imodium for 1-2 days, initially saw some bright red blood when wiping but has not seen any over the past week or so; consider infectious versus other cause 2. Rectal bleeding: See above, likely related to internal hemorrhoids  Plan: 1. Discussed with the patient that if he sees any further rectal bleeding would recommend that we go ahead and schedule him for his colonoscopy which will be due in May of next year anyways due to his history of polyps. 2. Ordered stool studies including GI pathogen panel and O&P 3. Patient may continue Imodium use when necessary 4. Patient to follow in clinic in 3-4 weeks with me or sooner if necessary  Ellouise Newer, PA-C [facility] 03/20/2017, 1:35 PM  Cc: Alonna Buckler*   Agree with Ms. [facility] evaluation and management.  Gatha Mayer, MD, [facility]

## 2017-03-20 NOTE — Patient Instructions (Addendum)
Your physician has requested that you go to the basement for lab work before leaving today.  Stool Studies  Anderson Malta has requested that you follow up in clinic in 4-6 weeks with Dr Carlean Purl or her.  There are no open slots at this time for Dr. Carlean Purl. Please call back in 2 weeks to see if the schedule is out for [facility] and please schedule at that times. If you wish to see Dr. Carlean Purl you can see if he has any cancellations.  Thank you.

## 2017-03-22 ENCOUNTER — Ambulatory Visit: Payer: Medicare Other | Admitting: Physician Assistant

## 2017-03-26 ENCOUNTER — Other Ambulatory Visit: Payer: Medicare Other

## 2017-03-26 DIAGNOSIS — K625 Hemorrhage of anus and rectum: Secondary | ICD-10-CM

## 2017-03-26 DIAGNOSIS — R197 Diarrhea, unspecified: Secondary | ICD-10-CM

## 2017-03-27 LAB — OVA AND PARASITE EXAMINATION
CONCENTRATE RESULT:: NONE SEEN
SPECIMEN QUALITY:: ADEQUATE
TRICHROME RESULT:: NONE SEEN
VKL: 81146348

## 2017-04-02 LAB — GASTROINTESTINAL PATHOGEN PANEL PCR
C. difficile Tox A/B, PCR: NOT DETECTED
CAMPYLOBACTER, PCR: NOT DETECTED
Cryptosporidium, PCR: NOT DETECTED
E COLI (STEC) STX1/STX2, PCR: NOT DETECTED
E coli (ETEC) LT/ST PCR: NOT DETECTED
E coli 0157, PCR: NOT DETECTED
Giardia lamblia, PCR: NOT DETECTED
NOROVIRUS, PCR: NOT DETECTED
ROTAVIRUS, PCR: NOT DETECTED
SALMONELLA, PCR: NOT DETECTED
SHIGELLA, PCR: NOT DETECTED

## 2017-04-27 ENCOUNTER — Telehealth: Payer: Self-pay

## 2017-04-27 NOTE — Telephone Encounter (Signed)
-----   Message from Levin Erp, [place] sent at 04/27/2017  8:56 AM EST ----- Regarding: FW: needs a f/u appt Please have him come in for follow up appt. Thanks-JLL  ----- Message ----- From: Gatha Mayer, MD Sent: 04/26/2017   6:15 PM To: Levin Erp, PA Subject: needs a f/u appt                               Sounds like you planned for him to be seen again but I do not see that is arranged  Let's get him back in or the other option is to just set up a colonoscopy since he had bleeding and is still having intermittent diarrhea  Thanks  Glendell Docker

## 2017-04-27 NOTE — Telephone Encounter (Signed)
The pt has been schedule for followup with Anderson Malta, she is aware

## 2017-05-09 ENCOUNTER — Ambulatory Visit: Payer: Medicare Other | Admitting: Physician Assistant

## 2017-05-09 ENCOUNTER — Encounter (INDEPENDENT_AMBULATORY_CARE_PROVIDER_SITE_OTHER): Payer: Self-pay

## 2017-05-09 ENCOUNTER — Encounter: Payer: Self-pay | Admitting: Physician Assistant

## 2017-05-09 VITALS — BP 132/72 | HR 68 | Ht 70.75 in | Wt 236.4 lb

## 2017-05-09 DIAGNOSIS — Z86010 Personal history of colonic polyps: Secondary | ICD-10-CM | POA: Diagnosis not present

## 2017-05-09 DIAGNOSIS — R195 Other fecal abnormalities: Secondary | ICD-10-CM

## 2017-05-09 DIAGNOSIS — K625 Hemorrhage of anus and rectum: Secondary | ICD-10-CM

## 2017-05-09 NOTE — Progress Notes (Addendum)
Chief Complaint: Loose stools  HPI:    Luis [name] is a 76 year old Caucasian male with a past medical history as listed below, who returns to clinic today for follow-up of his diarrhea and rectal bleeding..      Please recall patient was last seen in clinic by me 03/20/17.  He regularly follows with Dr. Carlean Purl and his last colonoscopy was 5/812.  Patient does have a history of adenomatous colon polyps and repeat colonoscopy was recommended to 10/17/2017.  At time of last visit patient describes some rectal bleeding and also diarrhea "off-and-on" with the help of Imodium.  At that time patient had stool studies to include a GI pathogen panel and O&P.  He was continued on Imodium as needed.  Stool studies returned negative.    Today, the patient describes that he continues with a change in his bowel habits noting mostly loose stools.  Patient tells me that sometimes this can occur 4-5 times a day and other times can just happen once or twice.  Patient does explain that he also has occasional solid stools mixed in.  Patient denies any urgency with the stools and tells me that "it is not really hindering my life".  He tells me he it is just "inconvenient sometimes".  Patient describes that he has continued Imodium occasionally.  Patient denies seeing any further rectal bleeding.    Patient denies fever, chills, melena, weight loss, anorexia, nausea, vomiting, heartburn, reflux or symptoms that wake in the night.  Past Medical History:  Diagnosis Date  . Arthritis    "mid back; hands; knees" (02/24/2015)  . Basal cell carcinoma    left shoulder; mid chest; right eyelid (02/24/2015)  . CAD (coronary artery disease)    a. 08/2014 Cath/PCI: LM nl, LAD 30p, D1 95 (2.25x12 Resolute Integrity DES), LCX small, RI 100 (attempted PCI) - branches fill via L->L collats, RCA dominant, nl, RPDA/PLA nl, EF 60^. b. 02/24/2015 PCI CTO of Ramus DES x2.  . Chronic bronchitis (HCC)    hx  . Diverticulosis 05/2005  .  Elevated lipase   . Fuchs' corneal dystrophy   . History of adenomatous polyp of colon 05/2005   8 mm adenoma  . History of blood transfusion    "related to some of my surgeries"  . Hyperlipidemia   . Hypertension   . Prostate cancer ([place])    S/P seed implant  . Sarcoidosis   . Thrombocytopenia ([place])    a. Noted on prior labs, unclear of what w/u done.  . Trifascicular block     Past Surgical History:  Procedure Laterality Date  . BASAL CELL CARCINOMA EXCISION Left    shoulder  . CARDIAC CATHETERIZATION N/A 02/24/2015   Procedure: Coronary/Bypass Graft CTO Intervention;  Surgeon: Jettie Booze, MD;  Location: [place];  Service: Cardiovascular;  Laterality: N/A;  . CARDIAC CATHETERIZATION  02/24/2015   Procedure: Coronary/Graft Atherectomy;  Surgeon: Jettie Booze, MD;  Location: [place] [facility];  Service: Cardiovascular;;  . CATARACT EXTRACTION W/ INTRAOCULAR LENS  IMPLANT, BILATERAL Bilateral ~ 2011-2012  . COLONOSCOPY W/ POLYPECTOMY  06/08/2005 and 10/18/2010   8 mm adenoma 2006, none 2012. Diverticulosis and internal hemorrhoids.  . CORNEAL TRANSPLANT Bilateral ~ 2011-2012   "@ same time as cataract OR"  . CORONARY ANGIOPLASTY WITH STENT PLACEMENT  08/2014; 02/24/2015   "1 stent + 1 stent"  . EYE SURGERY    . FINGER SURGERY Right 2014   "reattached middle finger"  .  INSERTION PROSTATE RADIATION SEED  ~ 2011  . LAPAROSCOPIC CHOLECYSTECTOMY  2015  . LEFT HEART CATHETERIZATION WITH CORONARY ANGIOGRAM N/A 08/12/2014   Procedure: LEFT HEART CATHETERIZATION WITH CORONARY ANGIOGRAM;  Surgeon: Blane Ohara, MD;  Location[facility];  Service: Cardiovascular;  Laterality: N/A;  . LYMPH NODE BIOPSY     mid chest  . MOHS SURGERY Right ~ 2011   "eyelid; for basal cell"  . THOROCOTOMY WITH LOBECTOMY Right ~ 1991   partial removal right lung  . TONSILLECTOMY  ~ 1950    Current Outpatient Medications  Medication Sig Dispense Refill  . Cholecalciferol  (VITAMIN D3) 3000 units TABS Take 3,000 mg by mouth daily.    . clopidogrel (PLAVIX) 75 MG tablet TAKE 1 TABLET BY MOUTH  DAILY WITH BREAKFAST 90 tablet 3  . clotrimazole-betamethasone (LOTRISONE) cream Apply 1 application topically 2 (two) times daily as needed (rash).     . fluocinonide ointment (LIDEX) 5.28 % Apply 1 application topically daily as needed (Rash).     . Glucosamine Sulfate 500 MG TABS Take 1 tablet by mouth daily.     . metoprolol tartrate (LOPRESSOR) 25 MG tablet Take 0.5 tablets (12.5 mg total) by mouth 2 (two) times daily. 90 tablet 3  . Multiple Vitamin (MULTIVITAMIN WITH MINERALS) TABS tablet Take 1 tablet by mouth every evening.    . nitroGLYCERIN (NITROSTAT) 0.4 MG SL tablet Place 0.4 mg under the tongue every 5 (five) minutes as needed for chest pain. X 3 doses    . Omega-3 Fatty Acids (FISH OIL) 1200 MG CAPS Take 1,400 mg by mouth daily.     . rosuvastatin (CRESTOR) 20 MG tablet Take 1 tablet (20 mg total) by mouth daily. 90 tablet 1  . sildenafil (REVATIO) 20 MG tablet Take 20 mg by mouth as needed. Takes 60mg  prn    . silodosin (RAPAFLO) 8 MG CAPS capsule Take 8 mg by mouth as needed.    . traMADol (ULTRAM) 50 MG tablet Take 50-100 mg by mouth See admin instructions. Take 2 tablets (100 mg) every morning, Take 1 tablet (50 mg ) every evening. Take 1 tablet (50 mg) in the afternoon if needed for pain relief.     No current facility-administered medications for this visit.     Allergies as of 05/09/2017  . (No Known Allergies)    Family History  Problem Relation Age of Onset  . Heart disease Father 79       Died from "hardening of the arteries" age 76  . Coronary artery disease Sister 44  . Heart attack Sister   . Colon cancer Neg Hx   . Throat cancer Neg Hx   . Pancreatic cancer Neg Hx   . Prostate cancer Neg Hx   . Stroke Neg Hx   . Hypertension Neg Hx     Social History   Socioeconomic History  . Marital status: Married    Spouse name: Not on file    . Number of children: 2                                      Tobacco Use  . Smoking status: Current Some Day Smoker    Packs/day: 0.00    Years: 59.00    Pack years: 0.00    Types: Cigars  . Smokeless tobacco: Never Used  . Tobacco comment: 02/24/2015 "quit cigarettes ~ 2000  ago but smokes cigars ionce/month"  Substance and Sexual Activity  . Alcohol use: Yes    Alcohol/week: 1.2 oz    Types: 1 Glasses of wine, 1 Standard drinks or equivalent per week    Comment: Drinks maybe 1-2 drinks a week or less  . Drug use: No  . Sexual activity: Not on file  Other Topics Concern  . Not on file  Social History Narrative   Lives at home wife.      Review of Systems:    Constitutional: No weight loss, fever or chills Cardiovascular: No chest pain Respiratory: No SOB  Gastrointestinal: See HPI and otherwise negative   Physical Exam:  Vital signs: BP 132/72   Pulse 68   Ht 5' 10.75" (1.797 m)   Wt 236 lb 6.4 oz (107.2 kg)   SpO2 98%   BMI 33.20 kg/m   Constitutional:   Pleasant Caucasian male appears to be in NAD, Well developed, Well nourished, alert and cooperative Respiratory: Respirations even and unlabored. Lungs clear to auscultation bilaterally.   No wheezes, crackles, or rhonchi.  Cardiovascular: Normal S1, S2. No MRG. Regular rate and rhythm. No peripheral edema, cyanosis or pallor.  Gastrointestinal:  Soft, nondistended, nontender. No rebound or guarding. Normal bowel sounds. No appreciable masses or hepatomegaly. Psychiatric: Demonstrates good judgement and reason without abnormal affect or behaviors.  No recent labs or imaging.  Assessment: 1.  Loose stools: Continue, intermittent frequency, some solid, GI pathogen panel and O&P negative; most likely IBS 2.  Rectal bleeding: Has stopped 3.  History of adenomatous polyps: On colonoscopy in 2012, recommendations for repeat in 7 years  Plan: 1.  Discussed with the patient that he will be due for his  surveillance colonoscopy in May. 2.  Recommend the patient start a probiotic such as Align.  He should continue this for at least 2 months.  Provided him with a coupon. 3.  Recommend patient increase fiber in his diet to at least 25-35 g/day with use of fiber segment such as Benefiber 4.  Patient will follow in clinic with me in 3-4 months.  At that time, we will schedule him for his colonoscopy and discuss holding his Plavix.  Ellouise Newer, PA-C [facility] 05/09/2017, 9:25 AM  Cc: [name], Luis Spike*   I recommend we go ahead and schedule his colonoscopy sooner than may given change in bowel habits.  We will need to contact him about this - I think we could get ok to hold Plavix without seeing him in office and do a direct procedure.  Gatha Mayer, MD, [facility]

## 2017-05-09 NOTE — Patient Instructions (Signed)
A high fiber diet with plenty of fluids (up to 8 glasses of water daily) is suggested to relieve these symptoms.  Benefiber, 1 tablespoon once or twice daily can be used to keep bowels regular if needed.  We have given you a high fiber diet handout. Please strive to have 25-30 grams of fiber daily.   Please purchase the following medications over the counter and take as directed: Align probiotic once daily

## 2017-05-18 ENCOUNTER — Telehealth: Payer: Self-pay

## 2017-05-18 NOTE — Telephone Encounter (Signed)
The pt states he needs to call back to set up appt after he looks at his schedule.  He will call back next week.

## 2017-05-18 NOTE — Telephone Encounter (Signed)
Levin Erp, [place]  Luis Massed, RN        Please schedule for colo with Dr. Carlean Purl. He will need to hold blood thinner. Dr. Darnell Level sais ok for direct colo though, since he was just seen in office.   Thanks-JLL

## 2017-05-23 ENCOUNTER — Telehealth: Payer: Self-pay

## 2017-05-23 NOTE — Telephone Encounter (Signed)
-----   Message from Jettie Booze, MD sent at 05/23/2017 10:37 AM EST ----- OK to hold Plavix 5 days prior to procedure.  JV ----- Message ----- From: Jeoffrey Massed, RN Sent: 05/23/2017   9:56 AM To: Jettie Booze, MD  Can you please review for upcoming procedure.

## 2017-05-23 NOTE — Telephone Encounter (Signed)
The pt has been advised and verbalized understanding.  

## 2017-05-29 ENCOUNTER — Other Ambulatory Visit: Payer: Self-pay | Admitting: *Deleted

## 2017-05-29 ENCOUNTER — Telehealth: Payer: Self-pay

## 2017-05-29 MED ORDER — CLOPIDOGREL BISULFATE 75 MG PO TABS: 75.0000 mg | ORAL_TABLET | Freq: Every day | ORAL | 1 refills | Status: DC

## 2017-05-29 NOTE — Telephone Encounter (Signed)
Rx has been sent to the pharmacy electronically. ° °

## 2017-05-29 NOTE — Telephone Encounter (Signed)
error 

## 2017-06-08 ENCOUNTER — Ambulatory Visit (AMBULATORY_SURGERY_CENTER): Payer: Self-pay | Admitting: *Deleted

## 2017-06-08 ENCOUNTER — Other Ambulatory Visit: Payer: Self-pay

## 2017-06-08 VITALS — Ht 71.0 in | Wt 228.0 lb

## 2017-06-08 DIAGNOSIS — Z86010 Personal history of colonic polyps: Secondary | ICD-10-CM

## 2017-06-08 NOTE — Progress Notes (Signed)
Patient denies any allergies to eggs or soy. Patient denies any problems with anesthesia/sedation. Patient denies any oxygen use at home. Patient denies taking any diet/weight loss medications or blood thinners. EMMI education declined by pt.  

## 2017-06-15 ENCOUNTER — Telehealth: Payer: Self-pay | Admitting: Internal Medicine

## 2017-06-15 NOTE — Telephone Encounter (Signed)
Patient notified that he can keep procedure as scheduled.  He should let us know if he has fever or other concerns prior to the procedure next week.

## 2017-06-20 ENCOUNTER — Other Ambulatory Visit: Payer: Self-pay

## 2017-06-20 ENCOUNTER — Other Ambulatory Visit (INDEPENDENT_AMBULATORY_CARE_PROVIDER_SITE_OTHER): Payer: Medicare Other

## 2017-06-20 ENCOUNTER — Encounter: Payer: Self-pay | Admitting: Internal Medicine

## 2017-06-20 ENCOUNTER — Ambulatory Visit (AMBULATORY_SURGERY_CENTER): Payer: Medicare Other | Admitting: Internal Medicine

## 2017-06-20 VITALS — BP 165/61 | HR 65 | Temp 99.5°F | Resp 12 | Ht 70.0 in | Wt 236.0 lb

## 2017-06-20 DIAGNOSIS — C180 Malignant neoplasm of cecum: Secondary | ICD-10-CM

## 2017-06-20 DIAGNOSIS — Z1211 Encounter for screening for malignant neoplasm of colon: Secondary | ICD-10-CM

## 2017-06-20 DIAGNOSIS — Z86010 Personal history of colonic polyps: Secondary | ICD-10-CM | POA: Diagnosis present

## 2017-06-20 DIAGNOSIS — D120 Benign neoplasm of cecum: Secondary | ICD-10-CM

## 2017-06-20 LAB — COMPREHENSIVE METABOLIC PANEL
ALT: 22 U/L (ref 0–53)
AST: 20 U/L (ref 0–37)
Albumin: 3.6 g/dL (ref 3.5–5.2)
Alkaline Phosphatase: 61 U/L (ref 39–117)
BUN: 14 mg/dL (ref 6–23)
CALCIUM: 8.4 mg/dL (ref 8.4–10.5)
CHLORIDE: 102 meq/L (ref 96–112)
CO2: 27 meq/L (ref 19–32)
CREATININE: 0.85 mg/dL (ref 0.40–1.50)
GFR: 93.09 mL/min (ref >60.00)
GLUCOSE: 107 mg/dL — AB (ref 70–99)
Potassium: 4.6 mEq/L (ref 3.5–5.1)
SODIUM: 136 meq/L (ref 135–145)
Total Bilirubin: 0.6 mg/dL (ref 0.2–1.2)
Total Protein: 6 g/dL (ref 6.0–8.3)

## 2017-06-20 LAB — CBC WITH DIFFERENTIAL/PLATELET
BASOS PCT: 1.1 % (ref 0.0–3.0)
Basophils Absolute: 0.1 10*3/uL (ref 0.0–0.1)
Eosinophils Absolute: 0.2 10*3/uL (ref 0.0–0.7)
Eosinophils Relative: 2.1 % (ref 0.0–5.0)
HEMATOCRIT: 29.5 % — AB (ref 39.0–52.0)
Hemoglobin: 9.2 g/dL — ABNORMAL LOW (ref 13.0–17.0)
LYMPHS ABS: 0.5 10*3/uL — AB (ref 0.7–4.0)
MCHC: 31.3 g/dL (ref 30.0–36.0)
MCV: 75.3 fl — AB (ref 78.0–100.0)
Monocytes Absolute: 0.6 10*3/uL (ref 0.1–1.0)
Monocytes Relative: 8.7 % (ref 3.0–12.0)
NEUTROS ABS: 6 10*3/uL (ref 1.4–7.7)
NEUTROS PCT: 80.8 % — AB (ref 43.0–77.0)
PLATELETS: 228 10*3/uL (ref 150.0–400.0)
RBC: 3.92 Mil/uL — ABNORMAL LOW (ref 4.22–5.81)
RDW: 18 % — AB (ref 11.5–15.5)
WBC: 7.5 10*3/uL (ref 4.0–10.5)

## 2017-06-20 MED ORDER — SODIUM CHLORIDE 0.9 % IV SOLN: 500.0000 mL | INTRAVENOUS | Status: DC

## 2017-06-20 NOTE — Progress Notes (Signed)
Called to room to assist during endoscopic procedure.  Patient ID and intended procedure confirmed with present staff. Received instructions for my participation in the procedure from the performing physician.  

## 2017-06-20 NOTE — Op Note (Addendum)
[place] Patient Name: Luis [name] Procedure Date: 06/20/2017 9:34 AM MRN: [id number] Endoscopist: Gatha Mayer , MD Age: 77 Referring MD:  Date of Birth: 1940-11-01 Gender: Male Account #: [id number] Procedure:                Colonoscopy Indications:              Surveillance: Personal history of adenomatous                            polyps on last colonoscopy > 5 years ago Medicines:                Propofol per Anesthesia, Monitored Anesthesia Care Procedure:                Pre-Anesthesia Assessment:                           - Prior to the procedure, a History and Physical                            was performed, and patient medications and                            allergies were reviewed. The patient's tolerance of                            previous anesthesia was also reviewed. The risks                            and benefits of the procedure and the sedation                            options and risks were discussed with the patient.                            All questions were answered, and informed consent                            was obtained. Prior Anticoagulants: The patient                            last took Plavix (clopidogrel) 5 days prior to the                            procedure. ASA Grade Assessment: III - A patient                            with severe systemic disease. After reviewing the                            risks and benefits, the patient was deemed in                            satisfactory condition to undergo the procedure.  After obtaining informed consent, the colonoscope                            was passed under direct vision. Throughout the                            procedure, the patient's blood pressure, pulse, and                            oxygen saturations were monitored continuously. The                            Colonoscope was introduced through the anus and     advanced to the the cecum, identified by                            appendiceal orifice and ileocecal valve. The                            colonoscopy was performed without difficulty. The                            patient tolerated the procedure well. The quality                            of the bowel preparation was good. The ileocecal                            valve, appendiceal orifice, and rectum were                            photographed. The bowel preparation used was                            Miralax. Scope In: 9:50:12 AM Scope Out: 10:02:32 AM Scope Withdrawal Time: 0 hours 7 minutes 38 seconds  Total Procedure Duration: 0 hours 12 minutes 20 seconds  Findings:                 The perianal examination was normal.                           The digital rectal exam findings include flat firm                            prostate bed s/p radiation.                           An ulcerated mass was found in the cecum. The mass                            was partially circumferential (involving one-half                            of the lumen circumference). No bleeding was  present. This was biopsied with a cold forceps for                            histology. Verification of patient identification                            for the specimen was done. Estimated blood loss was                            minimal.                           Multiple diverticula were found in the entire colon.                           The exam was otherwise without abnormality on                            direct and retroflexion views. Complications:            No immediate complications. Estimated Blood Loss:     Estimated blood loss was minimal. Impression:               - Flat firm prostate bed s/p radiation found on                            digital rectal exam.                           - Malignant tumor in the cecum. Biopsied.                           -  Diverticulosis in the entire examined colon.                           - The examination was otherwise normal on direct                            and retroflexion views.                           - Personal history of colonic polyps. 8 mm adenoma                            2006, no polyps 2012 Recommendation:           - Patient has a contact number available for                            emergencies. The signs and symptoms of potential                            delayed complications were discussed with the                            patient. Return to normal activities tomorrow.  Written discharge instructions were provided to the                            patient.                           - Resume previous diet.                           - Continue present medications.                           - Await pathology results.                           - I have ordered labs - CBC, CEA and CMET                           Office will scheule CT chest/abdomen and pelvis                            with contrast re: cecal cancer                           I will call results of biopsies - sent rush                           - Repeat colonoscopy is recommended. The                            colonoscopy date will be determined after pathology                            results from today's exam become available for                            review.                           - Stay on ASA but continue to hold clopidogrel. Gatha Mayer, MD 06/20/2017 10:16:23 AM This report has been signed electronically.

## 2017-06-20 NOTE — Patient Instructions (Addendum)
There is a mass in the cecum - first part of the colon. It looks like a cancer.  I took biopsies and should know results tomorrow. Whatever it is I think it will need surgery to remove it.  To understand things better we will order CT scans and also some labs today.  Gatha Mayer, MD, [facility]   CT scan will be ordered by Dr Celesta Aver office staff and you will be called with date and time for this and instructions as to when to drink contrast solution ( given to you today)  You will be taken to lab for lab work today on discharge  Hold Plavix but continue to stay on Aspirin   Information on diverticulosis given to you today    YOU HAD AN ENDOSCOPIC PROCEDURE TODAY AT [facility]:   Refer to the procedure report that was given to you for any specific questions about what was found during the examination.  If the procedure report does not answer your questions, please call your gastroenterologist to clarify.  If you requested that your care partner not be given the details of your procedure findings, then the procedure report has been included in a sealed envelope for you to review at your convenience later.  YOU SHOULD EXPECT: Some feelings of bloating in the abdomen. Passage of more gas than usual.  Walking can help get rid of the air that was put into your GI tract during the procedure and reduce the bloating. If you had a lower endoscopy (such as a colonoscopy or flexible sigmoidoscopy) you may notice spotting of blood in your stool or on the toilet paper. If you underwent a bowel prep for your procedure, you may not have a normal bowel movement for a few days.  Please Note:  You might notice some irritation and congestion in your nose or some drainage.  This is from the oxygen used during your procedure.  There is no need for concern and it should clear up in a day or so.  SYMPTOMS TO REPORT IMMEDIATELY:   Following lower endoscopy (colonoscopy or flexible  sigmoidoscopy):  Excessive amounts of blood in the stool  Significant tenderness or worsening of abdominal pains  Swelling of the abdomen that is new, acute  Fever of 100F or higher    For urgent or emergent issues, a gastroenterologist can be reached at any hour by calling [phone].   DIET:  We do recommend a small meal at first, but then you may proceed to your regular diet.  Drink plenty of fluids but you should avoid alcoholic beverages for 24 hours.  ACTIVITY:  You should plan to take it easy for the rest of today and you should NOT DRIVE or use heavy machinery until tomorrow (because of the sedation medicines used during the test).    FOLLOW UP: Our staff will call the number listed on your records the next business day following your procedure to check on you and address any questions or concerns that you may have regarding the information given to you following your procedure. If we do not reach you, we will leave a message.  However, if you are feeling well and you are not experiencing any problems, there is no need to return our call.  We will assume that you have returned to your regular daily activities without incident.  If any biopsies were taken you will be contacted by phone or by letter within the next 1-3 weeks.  Please call us at [phone] if you have not heard about the biopsies in 3 weeks.    SIGNATURES/CONFIDENTIALITY: You and/or your care partner have signed paperwork which will be entered into your electronic medical record.  These signatures attest to the fact that that the information above on your After Visit Summary has been reviewed and is understood.  Full responsibility of the confidentiality of this discharge information lies with you and/or your care-partner

## 2017-06-21 ENCOUNTER — Encounter: Payer: Self-pay | Admitting: Internal Medicine

## 2017-06-21 ENCOUNTER — Telehealth: Payer: Self-pay

## 2017-06-21 DIAGNOSIS — K6389 Other specified diseases of intestine: Secondary | ICD-10-CM

## 2017-06-21 DIAGNOSIS — C180 Malignant neoplasm of cecum: Secondary | ICD-10-CM

## 2017-06-21 DIAGNOSIS — D500 Iron deficiency anemia secondary to blood loss (chronic): Secondary | ICD-10-CM

## 2017-06-21 HISTORY — DX: Iron deficiency anemia secondary to blood loss (chronic): D50.0

## 2017-06-21 HISTORY — DX: Malignant neoplasm of cecum: C18.0

## 2017-06-21 LAB — CEA: CEA: 1.3 ng/mL

## 2017-06-21 NOTE — Telephone Encounter (Signed)
Patient notified of CT scan abd/pelvis scheduled for 06/22/17 1:45 arrival for 2:00.  He has his contrast at home.  We reviewed instructions and he verbalized understanding.  He will call back for any questions or concerns.

## 2017-06-21 NOTE — Telephone Encounter (Signed)
  Follow up Call-  Call back number 06/20/2017  Post procedure Call Back phone  # [phone]  Permission to leave phone message Yes  Some recent data might be hidden     Patient questions:  Do you have a fever, pain , or abdominal swelling? No. Pain Score  0 *  Have you tolerated food without any problems? Yes.    Have you been able to return to your normal activities? Yes.    Do you have any questions about your discharge instructions: Diet   No. Medications  No. Follow up visit  No.  Do you have questions or concerns about your Care? No.  Actions: * If pain score is 4 or above: No action needed, pain <4.

## 2017-06-22 ENCOUNTER — Ambulatory Visit (INDEPENDENT_AMBULATORY_CARE_PROVIDER_SITE_OTHER)
Admission: RE | Admit: 2017-06-22 | Discharge: 2017-06-22 | Disposition: A | Discharge status: Home / Self Care | Payer: Medicare Other | Source: Ambulatory Visit | Attending: Internal Medicine | Admitting: Internal Medicine

## 2017-06-22 ENCOUNTER — Other Ambulatory Visit: Payer: Self-pay

## 2017-06-22 DIAGNOSIS — C189 Malignant neoplasm of colon, unspecified: Secondary | ICD-10-CM

## 2017-06-22 DIAGNOSIS — K6389 Other specified diseases of intestine: Secondary | ICD-10-CM

## 2017-06-22 DIAGNOSIS — K639 Disease of intestine, unspecified: Secondary | ICD-10-CM | POA: Diagnosis not present

## 2017-06-22 DIAGNOSIS — D509 Iron deficiency anemia, unspecified: Secondary | ICD-10-CM

## 2017-06-22 MED ORDER — IOPAMIDOL (ISOVUE-300) INJECTION 61%
100.0000 mL | Freq: Once | INTRAVENOUS | Status: AC | PRN
  Administered 2017-06-22: 100 mL via INTRAVENOUS

## 2017-06-22 NOTE — Addendum Note (Signed)
Addended by: Marlon Pel on: 06/22/2017 12:12 PM   Modules accepted: Orders

## 2017-06-22 NOTE — Progress Notes (Signed)
a 

## 2017-06-25 NOTE — Progress Notes (Signed)
I reviewed results with patient. He sees cardiology 1/22 Is waiting to hear from [place] for appointment with Dr. Redmond Pulling

## 2017-07-02 NOTE — Progress Notes (Signed)
Cardiology Office Note   Date:  07/03/2017   ID:  Luis [name], DOB March 01, 1941, MRN [id number]  PCP:  Isaias Sakai, DO    No chief complaint on file. CAD   Wt Readings from Last 3 Encounters:  07/03/17 227 lb 6.4 oz (103.1 kg)  06/20/17 236 lb (107 kg)  06/08/17 228 lb (103.4 kg)       History of Present Illness: Luis [name] is a 77 y.o. male   with history of CAD (s/p DES to diagonal 08/2014, s/p recent CTO PCI with overlapping DES to ramus 02/2015), HTN, HLD, prostate CA, and pulmonary fibrosis due to sarcoidosis. He has history of cath in 08/2014 showing 2 vessel CAD with severe diagonal stenosis and total occlusion of ramus intermedius. They were unable to cross total ramus occlusion at the time. He underwent successful PCI of the diagonal stenosis with DES. He was placed on aspirin and Plavix. No relief with Ranexa or Imdur. He had CTO PCI of the ramus due to increasing shortness of breath with exertion and the chest pain. He underwent this procedure on 02/24/15 - the 100% stenosis in the ramus was treated with 2 overlapping DES (Synergy DES 2.4x38 mm and 3.0 x 53mm).   In Dec 2017, he had an episode of angina relieved with NTG.  Ranexa was started.  He has noticed some fatigue.  He was told after colonoscopy that he had GI bleeding.  He is now getting iron infusions.  He will need a Gi surgery to remove a colon CA.    He walks regularly.  He has to go up hills.  He feels well.  Leg pains are the most prominent issue with walking.  Denies : Chest pain. Dizziness. Leg edema. Nitroglycerin use. Orthopnea. Palpitations. Paroxysmal nocturnal dyspnea. Shortness of breath. Syncope.   Past Medical History:  Diagnosis Date  . Adenocarcinoma of cecum ([place]) 06/21/2017  . Arthritis    "mid back; hands; knees" (02/24/2015)  . Basal cell carcinoma    left shoulder; mid chest; right eyelid (02/24/2015)  . CAD (coronary artery disease)    a. 08/2014 Cath/PCI: LM nl, LAD  30p, D1 95 (2.25x12 Resolute Integrity DES), LCX small, RI 100 (attempted PCI) - branches fill via L->L collats, RCA dominant, nl, RPDA/PLA nl, EF 60^. b. 02/24/2015 PCI CTO of Ramus DES x2.  . Chronic bronchitis ([facility])    hx  . Diverticulosis 05/2005  . Elevated lipase   . Fuchs' corneal dystrophy   . History of adenomatous polyp of colon 05/2005   8 mm adenoma  . History of blood transfusion    "related to some of my surgeries"  . Hyperlipidemia   . Hypertension   . Iron deficiency anemia due to chronic blood loss 06/21/2017  . Prostate cancer ([place]) 2011   S/P seed implant  . Sarcoidosis   . Thrombocytopenia ([place])    a. Noted on prior labs, unclear of what w/u done.  . Trifascicular block     Past Surgical History:  Procedure Laterality Date  . BASAL CELL CARCINOMA EXCISION Left    shoulder  . CARDIAC CATHETERIZATION N/A 02/24/2015   Procedure: Coronary/Bypass Graft CTO Intervention;  Surgeon: Jettie Booze, MD;  Location: [facility];  Service: Cardiovascular;  Laterality: N/A;  . CARDIAC CATHETERIZATION  02/24/2015   Procedure: Coronary/Graft Atherectomy;  Surgeon: Jettie Booze, MD;  Location: [facility];  Service: Cardiovascular;;  . CATARACT EXTRACTION W/ INTRAOCULAR LENS  IMPLANT,  BILATERAL Bilateral ~ 2011-2012  . COLONOSCOPY W/ POLYPECTOMY  06/08/2005 and 10/18/2010   8 mm adenoma 2006, none 2012. Diverticulosis and internal hemorrhoids.  . CORNEAL TRANSPLANT Bilateral ~ 2011-2012   "@ same time as cataract OR"  . CORONARY ANGIOPLASTY WITH STENT PLACEMENT  08/2014; 02/24/2015   "1 stent + 1 stent"  . EYE SURGERY    . FINGER SURGERY Right 2014   "reattached middle finger"  . INSERTION PROSTATE RADIATION SEED  ~ 2011  . LAPAROSCOPIC CHOLECYSTECTOMY  2015  . LEFT HEART CATHETERIZATION WITH CORONARY ANGIOGRAM N/A 08/12/2014   Procedure: LEFT HEART CATHETERIZATION WITH CORONARY ANGIOGRAM;  Surgeon: Blane Ohara, MD;  Location: [facility];  Service:  Cardiovascular;  Laterality: N/A;  . LYMPH NODE BIOPSY     mid chest  . MOHS SURGERY Right ~ 2011   "eyelid; for basal cell"  . THOROCOTOMY WITH LOBECTOMY Right ~ 1991   partial removal right lung  . TONSILLECTOMY  ~ 1950     Current Outpatient Medications  Medication Sig Dispense Refill  . ASPIRIN 81 PO Take 81 mg by mouth daily.    . Cholecalciferol (VITAMIN D3) 3000 units TABS Take 3,000 mg by mouth daily.    . clopidogrel (PLAVIX) 75 MG tablet Take 1 tablet (75 mg total) by mouth daily with breakfast. 90 tablet 1  . clotrimazole-betamethasone (LOTRISONE) cream Apply 1 application topically 2 (two) times daily as needed (rash).     . fluocinonide ointment (LIDEX) 8.52 % Apply 1 application topically daily as needed (Rash).     . Glucosamine Sulfate 500 MG TABS Take 1 tablet by mouth daily.     . metoprolol tartrate (LOPRESSOR) 25 MG tablet Take 0.5 tablets (12.5 mg total) by mouth 2 (two) times daily. 90 tablet 3  . Multiple Vitamin (MULTIVITAMIN WITH MINERALS) TABS tablet Take 1 tablet by mouth every evening.    . nitroGLYCERIN (NITROSTAT) 0.4 MG SL tablet Place 0.4 mg under the tongue every 5 (five) minutes as needed for chest pain. X 3 doses    . Omega-3 Fatty Acids (FISH OIL) 1200 MG CAPS Take 1,400 mg by mouth daily.     . rosuvastatin (CRESTOR) 20 MG tablet Take 1 tablet (20 mg total) by mouth daily. 90 tablet 1  . sildenafil (REVATIO) 20 MG tablet Take 20 mg by mouth as needed. Takes 60mg  prn    . silodosin (RAPAFLO) 8 MG CAPS capsule Take 8 mg by mouth as needed.    . traMADol (ULTRAM) 50 MG tablet Take 50-100 mg by mouth See admin instructions. Take 2 tablets (100 mg) every morning, Take 1 tablet (50 mg ) every evening. Take 1 tablet (50 mg) in the afternoon if needed for pain relief.     Current Facility-Administered Medications  Medication Dose Route Frequency Provider Last Rate Last Dose  . 0.9 %  sodium chloride infusion  500 mL Intravenous Continuous Gatha Mayer, MD         Allergies:   Patient has no known allergies.    Social History:  The patient  reports that he has been smoking cigars.  He has been smoking about 0.00 packs per day for the past 59.00 years. he has never used smokeless tobacco. He reports that he drinks about 1.2 oz of alcohol per week. He reports that he does not use drugs.   Family History:  The patient's family history includes Coronary artery disease (age of onset: 46) in his sister; Heart attack  in his sister; Heart disease (age of onset: 67) in his father.    ROS:  Please see the history of present illness.   Otherwise, review of systems are positive for recent GI bleed; he has been of of Plavix since early January; intentional weight loss.   All other systems are reviewed and negative.    PHYSICAL EXAM: VS:  BP (!) 110/54   Pulse (!) 58   Ht 5\' 10"  (1.778 m)   Wt 227 lb 6.4 oz (103.1 kg)   BMI 32.63 kg/m  , BMI Body mass index is 32.63 kg/m. GEN: Well nourished, well developed, in no acute distress  HEENT: normal  Neck: no JVD, carotid bruits, or masses Cardiac: RRR; no murmurs, rubs, or gallops,no edema  Respiratory:  clear to auscultation bilaterally, normal work of breathing GI: soft, nontender, nondistended, + BS MS: no deformity or atrophy  Skin: warm and dry, no rash Neuro:  Strength and sensation are intact Psych: euthymic mood, full affect   EKG:   The ekg ordered today demonstrates NSR, IVCD   Recent Labs: 06/20/2017: ALT 22; BUN 14; Creatinine, Ser 0.85; Hemoglobin 9.2; Platelets 228.0; Potassium 4.6; Sodium 136   Lipid Panel    Component Value Date/Time   CHOL 155 10/13/2015 1005   TRIG 186 (H) 10/13/2015 1005   HDL 49 10/13/2015 1005   CHOLHDL 3.2 10/13/2015 1005   VLDL 37 (H) 10/13/2015 1005   LDLCALC 69 10/13/2015 1005     Other studies Reviewed: Additional studies/ records that were reviewed today with results demonstrating: labs reviewed.   ASSESSMENT AND PLAN:  1. CAD: s/p  multiple PCI. No angina on medical therapy.  Continue aggressive secondary prevention. 2. Obesity: He has lost weight, with weight watchers.  3. Hyperlipidemia: LDL 87 in 5/18 4. DOE: Improved.  THen returned but likely related to anemia. Starting iron infusions. 5. HTN: Well controlled.   6. Preop eval: No further cardiac testing needed before surgery.  OK to hold aspirin 7 days prior to surgery.   Current medicines are reviewed at length with the patient today.  The patient concerns regarding his medicines were addressed.  The following changes have been made:  No change  Labs/ tests ordered today include:  No orders of the defined types were placed in this encounter.   Recommend 150 minutes/week of aerobic exercise Low fat, low carb, high fiber diet recommended  Disposition:   FU in 6 months   Signed, Larae Grooms, MD  07/03/2017 8:53 AM    [facility] [address], [place], [place]  [address] Phone: [phone]; Fax: [phone]

## 2017-07-03 ENCOUNTER — Ambulatory Visit: Payer: Medicare Other | Admitting: Interventional Cardiology

## 2017-07-03 ENCOUNTER — Encounter: Payer: Self-pay | Admitting: Interventional Cardiology

## 2017-07-03 ENCOUNTER — Ambulatory Visit (HOSPITAL_COMMUNITY)
Admission: RE | Admit: 2017-07-03 | Discharge: 2017-07-03 | Disposition: A | Discharge status: Home / Self Care | Payer: Medicare Other | Source: Ambulatory Visit | Attending: Family Medicine | Admitting: Family Medicine

## 2017-07-03 VITALS — BP 110/54 | HR 58 | Ht 70.0 in | Wt 227.4 lb

## 2017-07-03 DIAGNOSIS — E785 Hyperlipidemia, unspecified: Secondary | ICD-10-CM | POA: Diagnosis not present

## 2017-07-03 DIAGNOSIS — E669 Obesity, unspecified: Secondary | ICD-10-CM | POA: Diagnosis not present

## 2017-07-03 DIAGNOSIS — I25118 Atherosclerotic heart disease of native coronary artery with other forms of angina pectoris: Secondary | ICD-10-CM | POA: Diagnosis not present

## 2017-07-03 DIAGNOSIS — D509 Iron deficiency anemia, unspecified: Secondary | ICD-10-CM | POA: Diagnosis not present

## 2017-07-03 DIAGNOSIS — R0609 Other forms of dyspnea: Secondary | ICD-10-CM | POA: Diagnosis not present

## 2017-07-03 DIAGNOSIS — I10 Essential (primary) hypertension: Secondary | ICD-10-CM

## 2017-07-03 MED ORDER — FERUMOXYTOL INJECTION 510 MG/17 ML
510.0000 mg | INTRAVENOUS | Status: DC
  Administered 2017-07-03: 510 mg via INTRAVENOUS
  Filled 2017-07-03: qty 17

## 2017-07-03 NOTE — Discharge Instructions (Signed)

## 2017-07-03 NOTE — Patient Instructions (Signed)

## 2017-07-05 ENCOUNTER — Ambulatory Visit: Payer: Self-pay | Admitting: General Surgery

## 2017-07-10 ENCOUNTER — Ambulatory Visit (HOSPITAL_COMMUNITY)
Admission: RE | Admit: 2017-07-10 | Discharge: 2017-07-10 | Disposition: A | Discharge status: Home / Self Care | Payer: Medicare Other | Source: Ambulatory Visit | Attending: Internal Medicine | Admitting: Internal Medicine

## 2017-07-10 DIAGNOSIS — D509 Iron deficiency anemia, unspecified: Secondary | ICD-10-CM | POA: Diagnosis present

## 2017-07-10 MED ORDER — FERUMOXYTOL INJECTION 510 MG/17 ML
510.0000 mg | INTRAVENOUS | Status: DC
  Administered 2017-07-10: 510 mg via INTRAVENOUS
  Filled 2017-07-10: qty 17

## 2017-07-10 NOTE — Discharge Instructions (Signed)

## 2017-07-18 ENCOUNTER — Ambulatory Visit: Payer: Self-pay | Admitting: General Surgery

## 2017-07-20 NOTE — Patient Instructions (Addendum)
Luis [name]  07/20/2017   Your procedure is scheduled on: 07-26-17   Report to [facility] Main  Entrance    Report to admitting at 12PM   Call this number if you have problems the morning of surgery [phone]     Remember: Do not eat food After Midnight on Tuesday 07-24-17.  Drink only clear liquids all day Wednesday 07-24-17 and follow all bowel prep instructions provided by your surgeon! DRINK 2 PRESURGERY ENSURE DRINKS ON Wednesday NIGHT AT 10:00 PM AND 1 PRESURGERY DRINK THE DAY OF THE SURGERY AT 11:00AM.  NOTHING BY MOUTH EXCEPT CLEAR LIQUIDS UNTIL THREE HOURS PRIOR TO SCHEDULED SURGERY. NOTHING BY MOUTH AFTER 11:00AM DAY OF SURGERY!!!      Take these medicines the morning of surgery with A SIP OF WATER: METOPROLOL, TRAMADOL IF NEEDED                                You may not have any metal on your body including hair pins and              piercings  Do not wear jewelry, make-up, lotions, powders or perfumes, deodorant                       Men may shave face and neck.   Do not bring valuables to the hospital. [place].  Contacts, dentures or bridgework may not be worn into surgery.  Leave suitcase in the car. After surgery it may be brought to your room.                 Please read over the following fact sheets you were given: _____________________________________________________________________     CLEAR LIQUID DIET   Foods Allowed                                                                     Foods Excluded  Coffee and tea, regular and decaf                             liquids that you cannot  Plain Jell-O in any flavor                                             see through such as: Fruit ices (not with fruit pulp)                                     milk, soups, orange juice  Iced Popsicles                                    All solid food Carbonated beverages, regular and diet  Cranberry, grape and apple juices Sports drinks like Gatorade Lightly seasoned clear broth or consume(fat free) Sugar, honey syrup  Sample Menu Breakfast                                Lunch                                     Supper Cranberry juice                    Beef broth                            Chicken broth Jell-O                                     Grape juice                           Apple juice Coffee or tea                        Jell-O                                      Popsicle                                                Coffee or tea                        Coffee or tea  _____________________________________________________________________  [facility] - Preparing for Surgery Before surgery, you can play an important role.  Because skin is not sterile, your skin needs to be as free of germs as possible.  You can reduce the number of germs on your skin by washing with CHG (chlorahexidine gluconate) soap before surgery.  CHG is an antiseptic cleaner which kills germs and bonds with the skin to continue killing germs even after washing. Please DO NOT use if you have an allergy to CHG or antibacterial soaps.  If your skin becomes reddened/irritated stop using the CHG and inform your nurse when you arrive at Short Stay. Do not shave (including legs and underarms) for at least 48 hours prior to the first CHG shower.  You may shave your face/neck. Please follow these instructions carefully:  1.  Shower with CHG Soap the night before surgery and the  morning of Surgery.  2.  If you choose to wash your hair, wash your hair first as usual with your  normal  shampoo.  3.  After you shampoo, rinse your hair and body thoroughly to remove the  shampoo.                           4.  Use CHG as you would any other liquid soap.  You can apply chg directly  to the skin and wash  Gently with a scrungie or clean washcloth.  5.  Apply the CHG Soap  to your body ONLY FROM THE NECK DOWN.   Do not use on face/ open                           Wound or open sores. Avoid contact with eyes, ears mouth and genitals (private parts).                       Wash face,  Genitals (private parts) with your normal soap.             6.  Wash thoroughly, paying special attention to the area where your surgery  will be performed.  7.  Thoroughly rinse your body with warm water from the neck down.  8.  DO NOT shower/wash with your normal soap after using and rinsing off  the CHG Soap.                9.  Pat yourself dry with a clean towel.            10.  Wear clean pajamas.            11.  Place clean sheets on your bed the night of your first shower and do not  sleep with pets. Day of Surgery : Do not apply any lotions/deodorants the morning of surgery.  Please wear clean clothes to the hospital/surgery center.  FAILURE TO FOLLOW THESE INSTRUCTIONS MAY RESULT IN THE CANCELLATION OF YOUR SURGERY PATIENT SIGNATURE_________________________________  NURSE SIGNATURE__________________________________  ________________________________________________________________________   Adam Phenix  An incentive spirometer is a tool that can help keep your lungs clear and active. This tool measures how well you are filling your lungs with each breath. Taking long deep breaths may help reverse or decrease the chance of developing breathing (pulmonary) problems (especially infection) following:  A long period of time when you are unable to move or be active. BEFORE THE PROCEDURE   If the spirometer includes an indicator to show your best effort, your nurse or respiratory therapist will set it to a desired goal.  If possible, sit up straight or lean slightly forward. Try not to slouch.  Hold the incentive spirometer in an upright position. INSTRUCTIONS FOR USE  1. Sit on the edge of your bed if possible, or sit up as far as you can in bed or on a  chair. 2. Hold the incentive spirometer in an upright position. 3. Breathe out normally. 4. Place the mouthpiece in your mouth and seal your lips tightly around it. 5. Breathe in slowly and as deeply as possible, raising the piston or the ball toward the top of the column. 6. Hold your breath for 3-5 seconds or for as long as possible. Allow the piston or ball to fall to the bottom of the column. 7. Remove the mouthpiece from your mouth and breathe out normally. 8. Rest for a few seconds and repeat Steps 1 through 7 at least 10 times every 1-2 hours when you are awake. Take your time and take a few normal breaths between deep breaths. 9. The spirometer may include an indicator to show your best effort. Use the indicator as a goal to work toward during each repetition. 10. After each set of 10 deep breaths, practice coughing to be sure your lungs are clear. If you have an incision (the cut made at the time of  surgery), support your incision when coughing by placing a pillow or rolled up towels firmly against it. Once you are able to get out of bed, walk around indoors and cough well. You may stop using the incentive spirometer when instructed by your caregiver.  RISKS AND COMPLICATIONS  Take your time so you do not get dizzy or light-headed.  If you are in pain, you may need to take or ask for pain medication before doing incentive spirometry. It is harder to take a deep breath if you are having pain. AFTER USE  Rest and breathe slowly and easily.  It can be helpful to keep track of a log of your progress. Your caregiver can provide you with a simple table to help with this. If you are using the spirometer at home, follow these instructions: [place] IF:   You are having difficultly using the spirometer.  You have trouble using the spirometer as often as instructed.  Your pain medication is not giving enough relief while using the spirometer.  You develop fever of 100.5 F  (38.1 C) or higher. SEEK IMMEDIATE MEDICAL CARE IF:   You cough up bloody sputum that had not been present before.  You develop fever of 102 F (38.9 C) or greater.  You develop worsening pain at or near the incision site. MAKE SURE YOU:   Understand these instructions.  Will watch your condition.  Will get help right away if you are not doing well or get worse. Document Released: 10/09/2006 Document Revised: 08/21/2011 Document Reviewed: 12/10/2006 [facility] Patient Information 2014 [place], [place].                 ________________________________________________________________________

## 2017-07-20 NOTE — Progress Notes (Signed)
LOV/CARDIAC CLEARANCE DR Irish Lack 07-03-17 Epic  EKG 07-03-17 Epic  CT CHEST 06-22-17 Epic  ECHO 05-30-16 Epic

## 2017-07-23 ENCOUNTER — Encounter (HOSPITAL_COMMUNITY)
Admission: RE | Admit: 2017-07-23 | Discharge: 2017-07-23 | Disposition: A | Discharge status: Home / Self Care | Payer: Medicare Other | Source: Ambulatory Visit | Attending: General Surgery | Admitting: General Surgery

## 2017-07-23 ENCOUNTER — Other Ambulatory Visit: Payer: Self-pay

## 2017-07-23 ENCOUNTER — Encounter (HOSPITAL_COMMUNITY): Payer: Self-pay

## 2017-07-23 LAB — COMPREHENSIVE METABOLIC PANEL
ALBUMIN: 3.5 g/dL (ref 3.5–5.0)
ALK PHOS: 67 U/L (ref 38–126)
ALT: 23 U/L (ref 17–63)
AST: 30 U/L (ref 15–41)
Anion gap: 8 (ref 5–15)
BUN: 16 mg/dL (ref 6–20)
CALCIUM: 8.8 mg/dL — AB (ref 8.9–10.3)
CO2: 25 mmol/L (ref 22–32)
CREATININE: 0.86 mg/dL (ref 0.61–1.24)
Chloride: 105 mmol/L (ref 101–111)
GFR calc non Af Amer: >60 mL/min (ref >60)
GLUCOSE: 97 mg/dL (ref 65–99)
Potassium: 5 mmol/L (ref 3.5–5.1)
SODIUM: 138 mmol/L (ref 135–145)
Total Bilirubin: 0.6 mg/dL (ref 0.3–1.2)
Total Protein: 6.1 g/dL — ABNORMAL LOW (ref 6.5–8.1)

## 2017-07-23 LAB — CBC WITH DIFFERENTIAL/PLATELET
BASOS PCT: 0 %
Basophils Absolute: 0 10*3/uL (ref 0.0–0.1)
EOS ABS: 0.3 10*3/uL (ref 0.0–0.7)
Eosinophils Relative: 5 %
HCT: 32.9 % — ABNORMAL LOW (ref 39.0–52.0)
Hemoglobin: 10.4 g/dL — ABNORMAL LOW (ref 13.0–17.0)
Lymphocytes Relative: 13 %
Lymphs Abs: 0.8 10*3/uL (ref 0.7–4.0)
MCH: 26.5 pg (ref 26.0–34.0)
MCHC: 31.6 g/dL (ref 30.0–36.0)
MCV: 83.9 fL (ref 78.0–100.0)
Monocytes Absolute: 0.6 10*3/uL (ref 0.1–1.0)
Monocytes Relative: 10 %
Neutro Abs: 4.1 10*3/uL (ref 1.7–7.7)
Neutrophils Relative %: 72 %
Platelets: 159 10*3/uL (ref 150–400)
RBC: 3.92 MIL/uL — ABNORMAL LOW (ref 4.22–5.81)
RDW: 24.5 % — ABNORMAL HIGH (ref 11.5–15.5)
WBC: 5.8 10*3/uL (ref 4.0–10.5)

## 2017-07-23 LAB — HEMOGLOBIN A1C
Hgb A1c MFr Bld: 5.2 % (ref 4.8–5.6)
Mean Plasma Glucose: 102.54 mg/dL

## 2017-07-26 ENCOUNTER — Inpatient Hospital Stay (HOSPITAL_COMMUNITY)
Admission: RE | Admit: 2017-07-26 | Discharge: 2017-07-31 | DRG: 331 | Disposition: A | Discharge status: Home / Self Care | Payer: Medicare Other | Source: Ambulatory Visit | Attending: General Surgery | Admitting: General Surgery

## 2017-07-26 ENCOUNTER — Inpatient Hospital Stay (HOSPITAL_COMMUNITY): Payer: Medicare Other | Admitting: Anesthesiology

## 2017-07-26 ENCOUNTER — Other Ambulatory Visit: Payer: Self-pay

## 2017-07-26 ENCOUNTER — Encounter (HOSPITAL_COMMUNITY): Payer: Self-pay | Admitting: *Deleted

## 2017-07-26 ENCOUNTER — Encounter (HOSPITAL_COMMUNITY)
Admission: RE | Disposition: A | Discharge status: Home / Self Care | Payer: Self-pay | Source: Ambulatory Visit | Attending: General Surgery

## 2017-07-26 DIAGNOSIS — J209 Acute bronchitis, unspecified: Secondary | ICD-10-CM | POA: Diagnosis not present

## 2017-07-26 DIAGNOSIS — Z955 Presence of coronary angioplasty implant and graft: Secondary | ICD-10-CM | POA: Diagnosis not present

## 2017-07-26 DIAGNOSIS — Z800 Family history of malignant neoplasm of digestive organs: Secondary | ICD-10-CM

## 2017-07-26 DIAGNOSIS — E669 Obesity, unspecified: Secondary | ICD-10-CM | POA: Diagnosis present

## 2017-07-26 DIAGNOSIS — Z86010 Personal history of colonic polyps: Secondary | ICD-10-CM | POA: Diagnosis not present

## 2017-07-26 DIAGNOSIS — D860 Sarcoidosis of lung: Secondary | ICD-10-CM | POA: Diagnosis present

## 2017-07-26 DIAGNOSIS — I10 Essential (primary) hypertension: Secondary | ICD-10-CM | POA: Diagnosis present

## 2017-07-26 DIAGNOSIS — K7689 Other specified diseases of liver: Secondary | ICD-10-CM | POA: Diagnosis present

## 2017-07-26 DIAGNOSIS — Z79899 Other long term (current) drug therapy: Secondary | ICD-10-CM | POA: Diagnosis not present

## 2017-07-26 DIAGNOSIS — L728 Other follicular cysts of the skin and subcutaneous tissue: Secondary | ICD-10-CM | POA: Diagnosis present

## 2017-07-26 DIAGNOSIS — Z7982 Long term (current) use of aspirin: Secondary | ICD-10-CM

## 2017-07-26 DIAGNOSIS — Z85828 Personal history of other malignant neoplasm of skin: Secondary | ICD-10-CM | POA: Diagnosis not present

## 2017-07-26 DIAGNOSIS — Z79891 Long term (current) use of opiate analgesic: Secondary | ICD-10-CM

## 2017-07-26 DIAGNOSIS — Z947 Corneal transplant status: Secondary | ICD-10-CM

## 2017-07-26 DIAGNOSIS — Z6830 Body mass index (BMI) 30.0-30.9, adult: Secondary | ICD-10-CM

## 2017-07-26 DIAGNOSIS — Z961 Presence of intraocular lens: Secondary | ICD-10-CM | POA: Diagnosis present

## 2017-07-26 DIAGNOSIS — I2510 Atherosclerotic heart disease of native coronary artery without angina pectoris: Secondary | ICD-10-CM | POA: Diagnosis present

## 2017-07-26 DIAGNOSIS — R112 Nausea with vomiting, unspecified: Secondary | ICD-10-CM | POA: Diagnosis not present

## 2017-07-26 DIAGNOSIS — F17290 Nicotine dependence, other tobacco product, uncomplicated: Secondary | ICD-10-CM | POA: Diagnosis present

## 2017-07-26 DIAGNOSIS — Z8546 Personal history of malignant neoplasm of prostate: Secondary | ICD-10-CM | POA: Diagnosis not present

## 2017-07-26 DIAGNOSIS — Z902 Acquired absence of lung [part of]: Secondary | ICD-10-CM | POA: Diagnosis not present

## 2017-07-26 DIAGNOSIS — R05 Cough: Secondary | ICD-10-CM

## 2017-07-26 DIAGNOSIS — Z973 Presence of spectacles and contact lenses: Secondary | ICD-10-CM | POA: Diagnosis not present

## 2017-07-26 DIAGNOSIS — R0902 Hypoxemia: Secondary | ICD-10-CM | POA: Diagnosis not present

## 2017-07-26 DIAGNOSIS — R0602 Shortness of breath: Secondary | ICD-10-CM

## 2017-07-26 DIAGNOSIS — Z7902 Long term (current) use of antithrombotics/antiplatelets: Secondary | ICD-10-CM

## 2017-07-26 DIAGNOSIS — Z716 Tobacco abuse counseling: Secondary | ICD-10-CM

## 2017-07-26 DIAGNOSIS — R058 Other specified cough: Secondary | ICD-10-CM

## 2017-07-26 DIAGNOSIS — Z9049 Acquired absence of other specified parts of digestive tract: Secondary | ICD-10-CM | POA: Diagnosis not present

## 2017-07-26 DIAGNOSIS — D500 Iron deficiency anemia secondary to blood loss (chronic): Secondary | ICD-10-CM | POA: Diagnosis present

## 2017-07-26 DIAGNOSIS — C180 Malignant neoplasm of cecum: Principal | ICD-10-CM | POA: Diagnosis present

## 2017-07-26 DIAGNOSIS — D869 Sarcoidosis, unspecified: Secondary | ICD-10-CM | POA: Diagnosis not present

## 2017-07-26 DIAGNOSIS — Z8249 Family history of ischemic heart disease and other diseases of the circulatory system: Secondary | ICD-10-CM

## 2017-07-26 HISTORY — PX: LAPAROSCOPIC PARTIAL COLECTOMY: SHX5907

## 2017-07-26 LAB — BASIC METABOLIC PANEL
ANION GAP: 9 (ref 5–15)
BUN: 8 mg/dL (ref 6–20)
CALCIUM: 8.1 mg/dL — AB (ref 8.9–10.3)
CO2: 22 mmol/L (ref 22–32)
Chloride: 104 mmol/L (ref 101–111)
Creatinine, Ser: 1.17 mg/dL (ref 0.61–1.24)
GFR, EST NON AFRICAN AMERICAN: 59 mL/min — AB (ref >60)
Glucose, Bld: 190 mg/dL — ABNORMAL HIGH (ref 65–99)
Potassium: 4 mmol/L (ref 3.5–5.1)
Sodium: 135 mmol/L (ref 135–145)

## 2017-07-26 LAB — TYPE AND SCREEN
ABO/RH(D): O POS
Antibody Screen: NEGATIVE

## 2017-07-26 LAB — CBC
HCT: 34.3 % — ABNORMAL LOW (ref 39.0–52.0)
HEMOGLOBIN: 11 g/dL — AB (ref 13.0–17.0)
MCH: 26.6 pg (ref 26.0–34.0)
MCHC: 32.1 g/dL (ref 30.0–36.0)
MCV: 83.1 fL (ref 78.0–100.0)
Platelets: 129 10*3/uL — ABNORMAL LOW (ref 150–400)
RBC: 4.13 MIL/uL — AB (ref 4.22–5.81)
RDW: 24.1 % — ABNORMAL HIGH (ref 11.5–15.5)
WBC: 10.8 10*3/uL — AB (ref 4.0–10.5)

## 2017-07-26 SURGERY — LAPAROSCOPIC PARTIAL COLECTOMY
Anesthesia: General | Site: Abdomen

## 2017-07-26 MED ORDER — PROMETHAZINE HCL 25 MG/ML IJ SOLN: 12.5000 mg | Freq: Four times a day (QID) | INTRAMUSCULAR | Status: DC | PRN

## 2017-07-26 MED ORDER — PROPOFOL 10 MG/ML IV BOLUS
INTRAVENOUS | Status: AC
  Filled 2017-07-26: qty 20

## 2017-07-26 MED ORDER — TRAMADOL HCL 50 MG PO TABS
50.0000 mg | ORAL_TABLET | Freq: Four times a day (QID) | ORAL | Status: DC | PRN
  Administered 2017-07-26 – 2017-07-30 (×8): 50 mg via ORAL
  Filled 2017-07-26 (×8): qty 1

## 2017-07-26 MED ORDER — ROCURONIUM BROMIDE 10 MG/ML (PF) SYRINGE
PREFILLED_SYRINGE | INTRAVENOUS | Status: DC | PRN
  Administered 2017-07-26: 10 mg via INTRAVENOUS
  Administered 2017-07-26: 60 mg via INTRAVENOUS
  Administered 2017-07-26: 10 mg via INTRAVENOUS

## 2017-07-26 MED ORDER — BUPIVACAINE-EPINEPHRINE 0.25% -1:200000 IJ SOLN
INTRAMUSCULAR | Status: DC | PRN
  Administered 2017-07-26: 3 mL
  Administered 2017-07-26: 1000 mL
  Administered 2017-07-26: 50 mL

## 2017-07-26 MED ORDER — ACETAMINOPHEN 500 MG PO TABS: 1000.0000 mg | ORAL_TABLET | ORAL | Status: DC

## 2017-07-26 MED ORDER — HYDROMORPHONE HCL 1 MG/ML IJ SOLN
INTRAMUSCULAR | Status: AC
  Administered 2017-07-26: 0.25 mg via INTRAVENOUS
  Filled 2017-07-26: qty 1

## 2017-07-26 MED ORDER — FENTANYL CITRATE (PF) 100 MCG/2ML IJ SOLN
INTRAMUSCULAR | Status: DC | PRN
  Administered 2017-07-26: 100 ug via INTRAVENOUS
  Administered 2017-07-26 (×4): 50 ug via INTRAVENOUS
  Administered 2017-07-26: 100 ug via INTRAVENOUS
  Administered 2017-07-26: 50 ug via INTRAVENOUS
  Administered 2017-07-26: 100 ug via INTRAVENOUS

## 2017-07-26 MED ORDER — LIDOCAINE 2% (20 MG/ML) 5 ML SYRINGE
INTRAMUSCULAR | Status: DC | PRN
Start: 2017-07-26 — End: 2017-07-26
  Administered 2017-07-26: 5.63 mL/h via INTRAVENOUS

## 2017-07-26 MED ORDER — BUPIVACAINE-EPINEPHRINE 0.25% -1:200000 IJ SOLN
INTRAMUSCULAR | Status: AC
  Filled 2017-07-26: qty 1

## 2017-07-26 MED ORDER — BUPIVACAINE LIPOSOME 1.3 % IJ SUSP
20.0000 mL | Freq: Once | INTRAMUSCULAR | Status: AC
  Administered 2017-07-26: 20 mL
  Filled 2017-07-26: qty 20

## 2017-07-26 MED ORDER — LIDOCAINE 2% (20 MG/ML) 5 ML SYRINGE
INTRAMUSCULAR | Status: AC
  Filled 2017-07-26: qty 10

## 2017-07-26 MED ORDER — FENTANYL CITRATE (PF) 250 MCG/5ML IJ SOLN
INTRAMUSCULAR | Status: AC
  Filled 2017-07-26: qty 5

## 2017-07-26 MED ORDER — CHLORHEXIDINE GLUCONATE 4 % EX LIQD: 60.0000 mL | Freq: Once | CUTANEOUS | Status: DC

## 2017-07-26 MED ORDER — SODIUM CHLORIDE 0.9 % IR SOLN
Status: DC | PRN
  Administered 2017-07-26: 1000 mL

## 2017-07-26 MED ORDER — ONDANSETRON HCL 4 MG PO TABS: 4.0000 mg | ORAL_TABLET | Freq: Four times a day (QID) | ORAL | Status: DC | PRN

## 2017-07-26 MED ORDER — DIPHENHYDRAMINE HCL 12.5 MG/5ML PO ELIX: 12.5000 mg | ORAL_SOLUTION | Freq: Four times a day (QID) | ORAL | Status: DC | PRN

## 2017-07-26 MED ORDER — FENTANYL CITRATE (PF) 100 MCG/2ML IJ SOLN
INTRAMUSCULAR | Status: AC
  Filled 2017-07-26: qty 2

## 2017-07-26 MED ORDER — KETAMINE HCL 10 MG/ML IJ SOLN
INTRAMUSCULAR | Status: AC
  Filled 2017-07-26: qty 1

## 2017-07-26 MED ORDER — ACETAMINOPHEN 500 MG PO TABS
1000.0000 mg | ORAL_TABLET | ORAL | Status: AC
  Administered 2017-07-26: 1000 mg via ORAL
  Filled 2017-07-26: qty 2

## 2017-07-26 MED ORDER — PROMETHAZINE HCL 25 MG/ML IJ SOLN: 6.2500 mg | INTRAMUSCULAR | Status: DC | PRN

## 2017-07-26 MED ORDER — KETAMINE HCL 50 MG/ML IJ SOLN
INTRAMUSCULAR | Status: DC | PRN
  Administered 2017-07-26: 20 mg via INTRAMUSCULAR

## 2017-07-26 MED ORDER — METOPROLOL TARTRATE 12.5 MG HALF TABLET
12.5000 mg | ORAL_TABLET | Freq: Two times a day (BID) | ORAL | Status: DC
  Administered 2017-07-26 – 2017-07-30 (×9): 12.5 mg via ORAL
  Filled 2017-07-26 (×11): qty 1

## 2017-07-26 MED ORDER — HEPARIN SODIUM (PORCINE) 5000 UNIT/ML IJ SOLN
5000.0000 [IU] | Freq: Once | INTRAMUSCULAR | Status: AC
  Administered 2017-07-26: 5000 [IU] via SUBCUTANEOUS
  Filled 2017-07-26: qty 1

## 2017-07-26 MED ORDER — GABAPENTIN 300 MG PO CAPS
300.0000 mg | ORAL_CAPSULE | ORAL | Status: AC
  Administered 2017-07-26: 300 mg via ORAL
  Filled 2017-07-26: qty 1

## 2017-07-26 MED ORDER — ALVIMOPAN 12 MG PO CAPS
12.0000 mg | ORAL_CAPSULE | Freq: Two times a day (BID) | ORAL | Status: DC
  Administered 2017-07-27 – 2017-07-28 (×4): 12 mg via ORAL
  Filled 2017-07-26 (×6): qty 1

## 2017-07-26 MED ORDER — MEPERIDINE HCL 50 MG/ML IJ SOLN
INTRAMUSCULAR | Status: AC
  Administered 2017-07-26: 12.5 mg via INTRAVENOUS
  Filled 2017-07-26: qty 1

## 2017-07-26 MED ORDER — MORPHINE SULFATE (PF) 2 MG/ML IV SOLN
1.0000 mg | INTRAVENOUS | Status: DC | PRN
Start: 2017-07-26 — End: 2017-07-31

## 2017-07-26 MED ORDER — HYDROMORPHONE HCL 1 MG/ML IJ SOLN
0.2500 mg | INTRAMUSCULAR | Status: DC | PRN
  Administered 2017-07-26: 0.5 mg via INTRAVENOUS
  Administered 2017-07-26 (×2): 0.25 mg via INTRAVENOUS

## 2017-07-26 MED ORDER — ONDANSETRON HCL 4 MG/2ML IJ SOLN
INTRAMUSCULAR | Status: DC | PRN
  Administered 2017-07-26: 4 mg via INTRAVENOUS

## 2017-07-26 MED ORDER — TAMSULOSIN HCL 0.4 MG PO CAPS
0.4000 mg | ORAL_CAPSULE | Freq: Every day | ORAL | Status: DC
  Administered 2017-07-28 – 2017-07-30 (×2): 0.4 mg via ORAL
  Filled 2017-07-26 (×3): qty 1

## 2017-07-26 MED ORDER — DIPHENHYDRAMINE HCL 50 MG/ML IJ SOLN: 12.5000 mg | Freq: Four times a day (QID) | INTRAMUSCULAR | Status: DC | PRN

## 2017-07-26 MED ORDER — LIDOCAINE 2% (20 MG/ML) 5 ML SYRINGE
INTRAMUSCULAR | Status: DC | PRN
  Administered 2017-07-26: 60 mg via INTRAVENOUS

## 2017-07-26 MED ORDER — ENOXAPARIN SODIUM 40 MG/0.4ML ~~LOC~~ SOLN
40.0000 mg | SUBCUTANEOUS | Status: DC
  Administered 2017-07-27 – 2017-07-31 (×5): 40 mg via SUBCUTANEOUS
  Filled 2017-07-26 (×5): qty 0.4

## 2017-07-26 MED ORDER — ALVIMOPAN 12 MG PO CAPS
12.0000 mg | ORAL_CAPSULE | ORAL | Status: AC
  Administered 2017-07-26: 12 mg via ORAL
  Filled 2017-07-26: qty 1

## 2017-07-26 MED ORDER — SUGAMMADEX SODIUM 500 MG/5ML IV SOLN
INTRAVENOUS | Status: DC | PRN
  Administered 2017-07-26: 300 mg via INTRAVENOUS

## 2017-07-26 MED ORDER — LACTATED RINGERS IV SOLN
INTRAVENOUS | Status: DC
  Administered 2017-07-26 (×2): via INTRAVENOUS

## 2017-07-26 MED ORDER — GABAPENTIN 300 MG PO CAPS
300.0000 mg | ORAL_CAPSULE | Freq: Two times a day (BID) | ORAL | Status: DC
  Administered 2017-07-26 – 2017-07-31 (×10): 300 mg via ORAL
  Filled 2017-07-26 (×10): qty 1

## 2017-07-26 MED ORDER — ONDANSETRON HCL 4 MG/2ML IJ SOLN: 4.0000 mg | Freq: Four times a day (QID) | INTRAMUSCULAR | Status: DC | PRN

## 2017-07-26 MED ORDER — POTASSIUM CHLORIDE IN NACL 20-0.9 MEQ/L-% IV SOLN
INTRAVENOUS | Status: DC
  Administered 2017-07-26 – 2017-07-28 (×5): via INTRAVENOUS
  Filled 2017-07-26 (×7): qty 1000

## 2017-07-26 MED ORDER — MEPERIDINE HCL 50 MG/ML IJ SOLN
6.2500 mg | INTRAMUSCULAR | Status: DC | PRN
  Administered 2017-07-26 (×2): 12.5 mg via INTRAVENOUS

## 2017-07-26 MED ORDER — ACETAMINOPHEN 500 MG PO TABS
1000.0000 mg | ORAL_TABLET | Freq: Four times a day (QID) | ORAL | Status: AC
  Administered 2017-07-26 – 2017-07-27 (×4): 1000 mg via ORAL
  Filled 2017-07-26 (×4): qty 2

## 2017-07-26 MED ORDER — 0.9 % SODIUM CHLORIDE (POUR BTL) OPTIME
TOPICAL | Status: DC | PRN
  Administered 2017-07-26: 6000 mL

## 2017-07-26 MED ORDER — CEFOTETAN DISODIUM-DEXTROSE 2-2.08 GM-%(50ML) IV SOLR
2.0000 g | INTRAVENOUS | Status: AC
  Administered 2017-07-26: 2 g via INTRAVENOUS
  Filled 2017-07-26: qty 50

## 2017-07-26 MED ORDER — ONDANSETRON HCL 4 MG/2ML IJ SOLN
INTRAMUSCULAR | Status: AC
  Filled 2017-07-26: qty 2

## 2017-07-26 MED ORDER — PROPOFOL 10 MG/ML IV BOLUS
INTRAVENOUS | Status: DC | PRN
  Administered 2017-07-26: 150 mg via INTRAVENOUS

## 2017-07-26 SURGICAL SUPPLY — 72 items
APPLIER CLIP 5 13 M/L LIGAMAX5 (MISCELLANEOUS)
APPLIER CLIP ROT 10 11.4 M/L (STAPLE)
APR CLP MED LRG 11.4X10 (STAPLE)
APR CLP MED LRG 5 ANG JAW (MISCELLANEOUS)
BLADE EXTENDED COATED 6.5IN (ELECTRODE) ×1 IMPLANT
BRR ADH 5X3 SEPRAFILM 6 SHT (MISCELLANEOUS)
BRR ADH 6X5 SEPRAFILM 1 SHT (MISCELLANEOUS)
CELLS DAT CNTRL 66122 CELL SVR (MISCELLANEOUS) IMPLANT
CHLORAPREP W/TINT 26ML (MISCELLANEOUS) ×2 IMPLANT
CLIP APPLIE 5 13 M/L LIGAMAX5 (MISCELLANEOUS) IMPLANT
CLIP APPLIE ROT 10 11.4 M/L (STAPLE) IMPLANT
COUNTER NEEDLE 20 DBL MAG RED (NEEDLE) ×2 IMPLANT
COVER MAYO STAND STRL (DRAPES) ×6 IMPLANT
DRAIN CHANNEL 19F RND (DRAIN) IMPLANT
DRAPE LAPAROSCOPIC ABDOMINAL (DRAPES) ×2 IMPLANT
DRSG OPSITE POSTOP 4X6 (GAUZE/BANDAGES/DRESSINGS) IMPLANT
DRSG OPSITE POSTOP 4X8 (GAUZE/BANDAGES/DRESSINGS) ×1 IMPLANT
DRSG TEGADERM 2-3/8X2-3/4 SM (GAUZE/BANDAGES/DRESSINGS) ×4 IMPLANT
DRSG TELFA 3X8 NADH (GAUZE/BANDAGES/DRESSINGS) IMPLANT
ELECT REM PT RETURN 15FT ADLT (MISCELLANEOUS) ×2 IMPLANT
EVACUATOR SILICONE 100CC (DRAIN) IMPLANT
GAUZE SPONGE 2X2 8PLY STRL LF (GAUZE/BANDAGES/DRESSINGS) IMPLANT
GAUZE SPONGE 4X4 12PLY STRL (GAUZE/BANDAGES/DRESSINGS) IMPLANT
GLOVE BIO SURGEON STRL SZ7.5 (GLOVE) ×12 IMPLANT
GLOVE INDICATOR 8.0 STRL GRN (GLOVE) ×4 IMPLANT
GOWN STRL REUS W/TWL XL LVL3 (GOWN DISPOSABLE) ×14 IMPLANT
LEGGING LITHOTOMY PAIR STRL (DRAPES) IMPLANT
LIGASURE IMPACT 36 18CM CVD LR (INSTRUMENTS) ×1 IMPLANT
PACK COLON (CUSTOM PROCEDURE TRAY) ×2 IMPLANT
PAD DRESSING TELFA 3X8 NADH (GAUZE/BANDAGES/DRESSINGS) IMPLANT
PAD POSITIONING PINK XL (MISCELLANEOUS) IMPLANT
POSITIONER SURGICAL ARM (MISCELLANEOUS) ×1 IMPLANT
RELOAD PROXIMATE 75MM BLUE (ENDOMECHANICALS) ×4 IMPLANT
RELOAD STAPLE 75 3.8 BLU REG (ENDOMECHANICALS) IMPLANT
RETRACTOR WND ALEXIS 18 MED (MISCELLANEOUS) IMPLANT
RTRCTR WOUND ALEXIS 18CM MED (MISCELLANEOUS)
SEALER TISSUE X1 CVD JAW (INSTRUMENTS) IMPLANT
SEPRAFILM MEMBRANE 5X6 (MISCELLANEOUS) IMPLANT
SEPRAFILM PROCEDURAL PACK 3X5 (MISCELLANEOUS) IMPLANT
SET IRRIG TUBING LAPAROSCOPIC (IRRIGATION / IRRIGATOR) ×2 IMPLANT
SHEARS HARMONIC ACE PLUS 36CM (ENDOMECHANICALS) ×2 IMPLANT
SLEEVE XCEL OPT CAN 5 100 (ENDOMECHANICALS) ×4 IMPLANT
SPONGE GAUZE 2X2 STER 10/PKG (GAUZE/BANDAGES/DRESSINGS) ×1
SPONGE LAP 18X18 X RAY DECT (DISPOSABLE) IMPLANT
STAPLER GUN LINEAR PROX 60 (STAPLE) ×1 IMPLANT
STAPLER PROXIMATE 75MM BLUE (STAPLE) ×1 IMPLANT
STAPLER VISISTAT 35W (STAPLE) ×2 IMPLANT
SUPPORT SCROTAL LG STRP (MISCELLANEOUS) ×1 IMPLANT
SUT CHROMIC 3 0 SH 27 (SUTURE) ×1 IMPLANT
SUT ETHILON 2 0 PS N (SUTURE) IMPLANT
SUT GUT CHROMIC 3 0 (SUTURE) ×1 IMPLANT
SUT PDS AB 0 CTX 60 (SUTURE) ×2 IMPLANT
SUT PDS AB 1 TP1 96 (SUTURE) IMPLANT
SUT SILK 2 0 (SUTURE) ×2
SUT SILK 2 0 SH CR/8 (SUTURE) ×2 IMPLANT
SUT SILK 2-0 18XBRD TIE 12 (SUTURE) ×1 IMPLANT
SUT SILK 3 0 (SUTURE) ×2
SUT SILK 3 0 SH CR/8 (SUTURE) ×3 IMPLANT
SUT SILK 3-0 18XBRD TIE 12 (SUTURE) ×1 IMPLANT
SUT VIC AB 1 CTX 18 (SUTURE) IMPLANT
SUT VIC AB 3-0 SH 18 (SUTURE) IMPLANT
SYR BULB IRRIGATION 50ML (SYRINGE) ×1 IMPLANT
SYS LAPSCP GELPORT 120MM (MISCELLANEOUS) ×2
SYSTEM LAPSCP GELPORT 120MM (MISCELLANEOUS) IMPLANT
TAPE UMBILICAL COTTON 1/8X30 (MISCELLANEOUS) IMPLANT
TOWEL OR 17X26 10 PK STRL BLUE (TOWEL DISPOSABLE) ×1 IMPLANT
TOWEL OR NON WOVEN STRL DISP B (DISPOSABLE) ×2 IMPLANT
TRAY FOLEY W/METER SILVER 16FR (SET/KITS/TRAYS/PACK) ×1 IMPLANT
TROCAR BLADELESS OPT 5 100 (ENDOMECHANICALS) ×2 IMPLANT
TROCAR XCEL 12X100 BLDLESS (ENDOMECHANICALS) IMPLANT
TROCAR XCEL NON-BLD 11X100MML (ENDOMECHANICALS) IMPLANT
TUBING INSUF HEATED (TUBING) ×2 IMPLANT

## 2017-07-26 NOTE — Anesthesia Postprocedure Evaluation (Signed)
Anesthesia Post Note  Patient: Luis [name]  Procedure(s) Performed: LAPAROSCOPIC PARTIAL COLECTOMY, EXCISION SCROTAL CYST (N/A Abdomen)     Patient location during evaluation: PACU Anesthesia Type: General Level of consciousness: awake and alert Pain management: pain level controlled Vital Signs Assessment: post-procedure vital signs reviewed and stable Respiratory status: spontaneous breathing, nonlabored ventilation, respiratory function stable and patient connected to nasal cannula oxygen Cardiovascular status: blood pressure returned to baseline and stable Postop Assessment: no apparent nausea or vomiting Anesthetic complications: no    Last Vitals:  Vitals:   07/26/17 2015 07/26/17 2023  BP: (!) 192/74 (!) 157/72  Pulse: 65   Resp: 17   Temp: 36.4 C   SpO2: 100%     Last Pain:  Vitals:   07/26/17 2000  TempSrc:   PainSc: 3                  Amberia Bayless S

## 2017-07-26 NOTE — Anesthesia Procedure Notes (Signed)
Procedure Name: Intubation Performed by: Analysia Dungee J, CRNA Pre-anesthesia Checklist: Patient identified, Emergency Drugs available, Suction available, Patient being monitored and Timeout performed Patient Re-evaluated:Patient Re-evaluated prior to induction Oxygen Delivery Method: Circle system utilized Preoxygenation: Pre-oxygenation with 100% oxygen Induction Type: IV induction Ventilation: Mask ventilation without difficulty Laryngoscope Size: Mac and 4 Grade View: Grade II Tube type: Oral Tube size: 7.5 mm Number of attempts: 1 Airway Equipment and Method: Stylet Placement Confirmation: ETT inserted through vocal cords under direct vision,  positive ETCO2,  CO2 detector and breath sounds checked- equal and bilateral Secured at: 23 cm Tube secured with: Tape Dental Injury: Teeth and Oropharynx as per pre-operative assessment        

## 2017-07-26 NOTE — H&P (Signed)
Luis [name] Documented: 07/05/2017 11:03 AM Location: [facility] Patient #: [id number] DOB: February 04, 1941 Married / Language: Cleophus Molt / Race: White Male   History of Present Illness Luis Hiss M. Yosef Krogh MD; 07/05/2017 1:24 PM) The patient is a 77 year old male who presents with colorectal cancer. He is referred by Dr Carlean Purl for evaluation of colon cancer. I removed his gallbladder a few years ago. He states that he noticed some blood in his stool. He was also found to have a drop in his blood count. Initially it was felt maybe to be due to internal hemorrhoids. However he was due for a colonoscopy later this year but it was moved up because of the drop in blood count. On colonoscopy he was found to have an ulcerated mass in the cecum. Biopsy proven adenocarcinoma. His colonoscopy was January 9. He underwent CT chest abdomen and pelvis on January 11. There is no evidence of metastatic disease. He had some chronic lung scarring and a stable hepatic cyst. His CEA level was 1.3. He has been getting some intermittent iron transfusions. He has no more melena or hematochezia. He denies any abdominal pain. He denies any nausea or vomiting. He reports good appetite. He denies any chest pain, chest pressure, shortness of breath, angina. He does not smoke.   Problem List/Past Medical Luis Hiss M. 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 Gallbladder Surgery - Laparoscopic  Lung Surgery  Right. Oral Surgery  Tonsillectomy  Vasectomy   Diagnostic Studies History (Janette Ranson, CMA; 07/05/2017 11:07 AM) Colonoscopy  within last year  Allergies (Janette Ranson, CMA; 07/05/2017 11:09 AM) No Known Drug Allergies [07/05/2017]:  Medication History Luis Hiss M.  Redmond Pulling, MD; 07/05/2017 1:24 PM) Rosuvastatin Calcium (20MG  Tablet, Oral) Active. Clopidogrel Bisulfate (75MG  Tablet, Oral) Active. Metoprolol Tartrate (25MG  Tablet, Oral) Active. TraMADol HCl (50MG  Tablet, Oral) Active. Nitrostat (0.3MG  Tab Sublingual, Sublingual) Active. Fish Oil (600MG  Capsule, Oral) Active. Multi Vitamin (Oral) Active. Sildenafil Citrate (50MG  Tablet, Oral) Active. Rapaflo (8MG  Capsule, Oral) Active. Fluzone High-Dose (0.5ML Susp Pref Syr, Intramuscular) Active. Betamethasone Dipropionate Aug (0.05% Cream, External) Active. Neomycin Sulfate (500MG  Tablet, 2 (two) Oral SEE NOTE, Taken starting 07/05/2017) Active. (TAKE TWO TABLETS AT 2 PM, 3 PM, AND 10 PM THE DAY PRIOR TO SURGERY) Flagyl (500MG  Tablet, 2 (two) Oral SEE NOTE, Taken starting 07/05/2017) Active. (Take at 2pm, 3pm, and 10pm the day prior to your colon operation)  Social History (Janette Ranson, CMA; 07/05/2017 11:07 AM) Alcohol use  Occasional alcohol use. Caffeine use  Carbonated beverages, Coffee, Tea. No drug use  Tobacco use  Former smoker.  Family History (Janette Ranson, CMA; 07/05/2017 11:07 AM) Arthritis  Mother, Sister. Heart Disease  Father, Sister. Heart disease in male family member before age 29   Other Problems Leighton Ruff. Redmond Pulling, MD; 07/05/2017 1:24 PM) Arthritis  Cancer  Colon Cancer  Diverticulosis  Hemorrhoids  High blood pressure  Hypercholesterolemia  Prostate Cancer     Review of Systems (Janette Ranson CMA; 07/05/2017 11:07 AM) General Not Present- Appetite Loss, Chills, Fatigue, Fever, Night Sweats, Weight Gain and Weight Loss. Skin Not Present- Change in Wart/Mole, Dryness, Hives, Jaundice, New Lesions, Non-Healing Wounds, Rash and Ulcer. HEENT Not Present- Earache, Hearing Loss, Hoarseness, Nose Bleed, Oral Ulcers, Ringing in the Ears, Seasonal Allergies, Sinus Pain, Sore Throat, Visual Disturbances, Wears  glasses/contact lenses and Yellow  Eyes. Respiratory Not Present- Bloody sputum, Chronic Cough, Difficulty Breathing, Snoring and Wheezing. Breast Not Present- Breast Mass, Breast Pain, Nipple Discharge and Skin Changes. Gastrointestinal Present- Change in Bowel Habits and Excessive gas. Not Present- Abdominal Pain, Bloating, Bloody Stool, Chronic diarrhea, Constipation, Difficulty Swallowing, Gets full quickly at meals, Hemorrhoids, Indigestion, Nausea, Rectal Pain and Vomiting. Male Genitourinary Not Present- Blood in Urine, Change in Urinary Stream, Frequency, Impotence, Nocturia, Painful Urination, Urgency and Urine Leakage. Musculoskeletal Present- Muscle Pain. Not Present- Back Pain, Joint Pain, Joint Stiffness, Muscle Weakness and Swelling of Extremities. Neurological Not Present- Decreased Memory, Fainting, Headaches, Numbness, Seizures, Tingling, Tremor, Trouble walking and Weakness. Psychiatric Not Present- Anxiety, Bipolar, Change in Sleep Pattern, Depression, Fearful and Frequent crying. Endocrine Not Present- Cold Intolerance, Excessive Hunger, Hair Changes, Heat Intolerance, Hot flashes and New Diabetes. Hematology Not Present- Blood Thinners, Easy Bruising, Excessive bleeding, Gland problems, HIV and Persistent Infections.  Vitals (Janette Ranson CMA; 07/05/2017 11:13 AM) 07/05/2017 11:12 AM Weight: 223 lb Height: 71in Body Surface Area: 2.21 m Body Mass Index: 31.1 kg/m  Pulse: 66 (Regular)  BP: 110/60 (Sitting, Left Arm, Standard)       Physical Exam Luis Hiss M. Alexes Lamarque MD; 07/05/2017 1:22 PM) General Mental Status-Alert. General Appearance-Consistent with stated age. Hydration-Well hydrated. Voice-Normal.  Head and Neck Head-normocephalic, atraumatic with no lesions or palpable masses. Trachea-midline. Thyroid Gland Characteristics - normal size and consistency.  Eye Eyeball - Bilateral-Normal. Sclera/Conjunctiva - Bilateral-No scleral icterus.  ENMT Ears -Note: normal  external ears.  Mouth and Throat -Note: lips intact.   Chest and Lung Exam Chest and lung exam reveals -quiet, even and easy respiratory effort with no use of accessory muscles and on auscultation, normal breath sounds, no adventitious sounds and normal vocal resonance. Inspection Chest Wall - Normal. Back - normal.  Breast - Did not examine.  Cardiovascular Cardiovascular examination reveals -normal heart sounds, regular rate and rhythm with no murmurs and normal pedal pulses bilaterally.  Abdomen Inspection Inspection of the abdomen reveals - No Hernias. Note: upper midline diastasis. Skin - Scar - Note: well healed trocar scars. Palpation/Percussion Palpation and Percussion of the abdomen reveal - Soft, Non Tender, No Rebound tenderness, No Rigidity (guarding) and No hepatosplenomegaly. Auscultation Auscultation of the abdomen reveals - Bowel sounds normal.  Peripheral Vascular Upper Extremity Palpation - Pulses bilaterally normal.  Neurologic Neurologic evaluation reveals -alert and oriented x 3 with no impairment of recent or remote memory. Mental Status-Normal.  Neuropsychiatric The patient's mood and affect are described as -normal. Judgment and Insight-insight is appropriate concerning matters relevant to self.  Musculoskeletal Normal Exam - Left-Upper Extremity Strength Normal and Lower Extremity Strength Normal. Normal Exam - Right-Upper Extremity Strength Normal and Lower Extremity Strength Normal.  Lymphatic Head & Neck  General Head & Neck Lymphatics: Bilateral - Description - Normal. Axillary - Did not examine. Femoral & Inguinal - Did not examine.    Assessment & Plan Luis Hiss M. Deckard Stuber MD; 07/05/2017 1:20 PM) CECAL CANCER (C18.0) Impression: We discussed his colonoscopy, pathology report and CT imaging. We discussed his diagnosis of cecal cancer. We discussed the surgical treatment which would include a right colectomy which I think can  be done laparoscopically.  I discussed the procedure in detail. The patient was given Neurosurgeon. We discussed the risks and benefits of surgery including, but not limited to bleeding, infection (such as wound infection, abdominal abscess), injury to surrounding structures, blood clot formation, urinary retention, incisional hernia, anastomotic stricture, anastomotic  leak, anesthesia risks, pulmonary & cardiac complications such as pneumonia &/or heart attack, need for additional procedures, ileus, & prolonged hospitalization. We discussed the typical postoperative recovery course, including limitations & restrictions postoperatively. I explained that the likelihood of improvement in their symptoms is good.  The patient has elected to proceed with surgery. ERAS protocol Current Plans Pt Education - [facility] Colon Bowel Prep 2018 ERAS/Miralax/Antibiotics Pt Education - Pamphlet Given - Laparoscopic Colorectal Surgery: discussed with patient and provided information. 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 Desires removal. Will do at end of case. Discussed risk/benefits.   Leighton Ruff. Redmond Pulling, MD, FACS General, Bariatric, & Minimally Invasive Surgery [facility], [place]

## 2017-07-26 NOTE — Addendum Note (Signed)
Addendum  created 07/26/17 2338 by West Pugh, CRNA   Intraprocedure Flowsheets edited

## 2017-07-26 NOTE — Interval H&P Note (Signed)
History and Physical Interval Note:  07/26/2017 3:30 PM  Luis [name]  has presented today for surgery, with the diagnosis of CECAL CANCER  The various methods of treatment have been discussed with the patient and family. After consideration of risks, benefits and other options for treatment, the patient has consented to  Procedure(s): LAPAROSCOPIC PARTIAL COLECTOMY, POSSIBLE EXCISION SCROTAL CYST (N/A) as a surgical intervention .  The patient's history has been reviewed, patient examined, no change in status, stable for surgery.  I have reviewed the patient's chart and labs.  Questions were answered to the patient's satisfaction.     Got ERAS meds/subcu heparin/shake  Leighton Ruff. Redmond Pulling, MD, FACS General, Bariatric, & Minimally Invasive Surgery [facility] [place]  Greer Pickerel

## 2017-07-26 NOTE — Anesthesia Preprocedure Evaluation (Signed)
Anesthesia Evaluation  Patient identified by MRN, date of birth, ID band Patient awake    Reviewed: Allergy & Precautions, NPO status , Patient's Chart, lab work & pertinent test results  Airway Mallampati: II  TM Distance: >3 FB Neck ROM: Full    Dental no notable dental hx.    Pulmonary Current Smoker,    Pulmonary exam normal breath sounds clear to auscultation       Cardiovascular hypertension, + CAD, + Cardiac Stents and + DOE  Normal cardiovascular exam Rhythm:Regular Rate:Normal     Neuro/Psych negative neurological ROS  negative psych ROS   GI/Hepatic negative GI ROS, Neg liver ROS,   Endo/Other  negative endocrine ROS  Renal/GU negative Renal ROS  negative genitourinary   Musculoskeletal negative musculoskeletal ROS (+)   Abdominal   Peds negative pediatric ROS (+)  Hematology  (+) anemia ,   Anesthesia Other Findings   Reproductive/Obstetrics negative OB ROS                             Anesthesia Physical Anesthesia Plan  ASA: III  Anesthesia Plan: General   Post-op Pain Management:    Induction: Intravenous  PONV Risk Score and Plan: 2 and Ondansetron, Dexamethasone and Treatment may vary due to age or medical condition  Airway Management Planned: Oral ETT  Additional Equipment:   Intra-op Plan:   Post-operative Plan: Extubation in OR  Informed Consent: I have reviewed the patients History and Physical, chart, labs and discussed the procedure including the risks, benefits and alternatives for the proposed anesthesia with the patient or authorized representative who has indicated his/her understanding and acceptance.   Dental advisory given  Plan Discussed with: CRNA  Anesthesia Plan Comments:         Anesthesia Quick Evaluation

## 2017-07-26 NOTE — Brief Op Note (Signed)
07/26/2017  7:07 PM  PATIENT:  Luis [name]  77 y.o. male  PRE-OPERATIVE DIAGNOSIS:  CECAL CANCER; scrotal cyst  POST-OPERATIVE DIAGNOSIS:  same  PROCEDURE:  Procedure(s): LAPAROSCOPIC ASSISTED RIGHT COLECTOMY, EXCISION SCROTAL CYST (N/A)  SURGEON:  Surgeon(s) and Role:    * Greer Pickerel, MD - Primary    * Cornett, Marcello Moores, MD - Assisting  PHYSICIAN ASSISTANT:   ASSISTANTS: see above   ANESTHESIA:   general  EBL:  100 mL   BLOOD ADMINISTERED:none  DRAINS: Urinary Catheter (Foley)   LOCAL MEDICATIONS USED:  OTHER Exparel  SPECIMEN:  Source of Specimen:  right colon  DISPOSITION OF SPECIMEN:  PATHOLOGY  COUNTS:  YES  TOURNIQUET:  * No tourniquets in log *  DICTATION: .Other Dictation: Dictation Number [id number]  PLAN OF CARE: Admit to inpatient   PATIENT DISPOSITION:  PACU - hemodynamically stable.   Delay start of Pharmacological VTE agent (>24hrs) due to surgical blood loss or risk of bleeding: no  Leighton Ruff. Redmond Pulling, MD, FACS General, Bariatric, & Minimally Invasive Surgery [facility], [place]

## 2017-07-26 NOTE — Transfer of Care (Signed)
Immediate Anesthesia Transfer of Care Note  Patient: Luis [name]  Procedure(s) Performed: LAPAROSCOPIC PARTIAL COLECTOMY, EXCISION SCROTAL CYST (N/A Abdomen)  Patient Location: PACU  Anesthesia Type:General  Level of Consciousness: oriented, drowsy and patient cooperative  Airway & Oxygen Therapy: Patient Spontanous Breathing and Patient connected to face mask oxygen  Post-op Assessment: Report given to RN and Post -op Vital signs reviewed and stable  Post vital signs: Reviewed and stable  Last Vitals:  Vitals:   07/26/17 1224  BP: 132/84  Pulse: 63  Resp: 18  Temp: 36.7 C  SpO2: 100%    Last Pain:  Vitals:   07/26/17 1224  TempSrc: Oral      Patients Stated Pain Goal: 4 (14/43/15 4008)  Complications: No apparent anesthesia complications

## 2017-07-27 ENCOUNTER — Encounter (HOSPITAL_COMMUNITY): Payer: Self-pay | Admitting: General Surgery

## 2017-07-27 LAB — BASIC METABOLIC PANEL
ANION GAP: 8 (ref 5–15)
BUN: 9 mg/dL (ref 6–20)
CALCIUM: 8 mg/dL — AB (ref 8.9–10.3)
CO2: 24 mmol/L (ref 22–32)
CREATININE: 0.96 mg/dL (ref 0.61–1.24)
Chloride: 105 mmol/L (ref 101–111)
GFR calc Af Amer: >60 mL/min (ref >60)
GFR calc non Af Amer: >60 mL/min (ref >60)
GLUCOSE: 121 mg/dL — AB (ref 65–99)
Potassium: 4.4 mmol/L (ref 3.5–5.1)
Sodium: 137 mmol/L (ref 135–145)

## 2017-07-27 LAB — CBC
HCT: 31.1 % — ABNORMAL LOW (ref 39.0–52.0)
Hemoglobin: 10 g/dL — ABNORMAL LOW (ref 13.0–17.0)
MCH: 27.1 pg (ref 26.0–34.0)
MCHC: 32.2 g/dL (ref 30.0–36.0)
MCV: 84.3 fL (ref 78.0–100.0)
PLATELETS: 123 10*3/uL — AB (ref 150–400)
RBC: 3.69 MIL/uL — ABNORMAL LOW (ref 4.22–5.81)
RDW: 24.7 % — AB (ref 11.5–15.5)
WBC: 5.9 10*3/uL (ref 4.0–10.5)

## 2017-07-27 MED ORDER — ACETAMINOPHEN 325 MG PO TABS
650.0000 mg | ORAL_TABLET | Freq: Four times a day (QID) | ORAL | Status: DC
  Administered 2017-07-28 – 2017-07-31 (×13): 650 mg via ORAL
  Filled 2017-07-27 (×13): qty 2

## 2017-07-27 MED ORDER — ENSURE SURGERY PO LIQD
237.0000 mL | Freq: Two times a day (BID) | ORAL | Status: DC
  Administered 2017-07-27 – 2017-07-31 (×6): 237 mL via ORAL
  Filled 2017-07-27 (×10): qty 237

## 2017-07-27 NOTE — Op Note (Signed)
NAMEMarland [name]  USIEL, ASTARITA NO.:  [id number]  MEDICAL RECORD NO.:  [id number]  LOCATION:  36                         FACILITY:  [facility]  PHYSICIAN:  Leighton Ruff. Redmond Pulling, MD, FACSDATE OF BIRTH:  Oct 12, 1940  DATE OF PROCEDURE:  07/26/2017 DATE OF DISCHARGE:                              OPERATIVE REPORT   PREOPERATIVE DIAGNOSES: 1. Cecal cancer. 2. Scrotal cyst.  POSTOPERATIVE DIAGNOSES: 1. Cecal cancer. 2. Scrotal cyst.  PROCEDURES: 1. Laparoscopic-assisted right colectomy. 2. Excision of scrotal cyst.  SURGEON:  Leighton Ruff. Redmond Pulling, MD, FACS.  ASSISTANT SURGEON:  Marcello Moores A. Cornett, M.D. FACS  ANESTHESIA:  General.  EBL:  100.  SPECIMENS: 1. Right colon. 2. Scrotal cyst, which was discarded.  INDICATIONS FOR PROCEDURE:  The patient is a 77 year old gentleman who was having some blood per rectum and anemia.  Colonoscopy was performed, which demonstrated an ulcerated cecal mass.  Biopsy came back consistent with adenocarcinoma.  Metastatic workup was unremarkable.  He was referred for surgical evaluation and consideration for partial colectomy.  I had a long discussion with the patient and the wife regarding risks and benefits of surgery.  Please see outside records for that description.  The patient also complained of a cyst in the right portion of his scrotal sac that was bothering him.  He desired surgical excision of that.  DESCRIPTION OF PROCEDURE:  The patient underwent ERAS protocol where he had mild bowel prep the day before surgery while still drinking liquids up to 3 hours before surgery including a carb-loading protein shake.  He also received oral Tylenol, gabapentin and Entereg preoperatively in addition to 5000 units of subcutaneous heparin.  He was brought to the operating room and placed supine on the operating room table.  General endotracheal anesthesia was established.  Sequential compression devices were placed.  His left arm was tucked on his  side with the appropriate padding.  His abdomen was prepped and draped in usual standard surgical fashion.  He received IV antibiotic prior to skin incision.  Surgical time-out was performed.  Access to the abdomen was gained using the Optiview technique in the left upper quadrant.  A small incision was made.  Then, using a 5-mm 0- degree laparoscope through a 5-mm trocar, it was advanced through all layers of the abdominal wall under direct visualization and the abdominal cavity was entered.  Pneumoperitoneum was smoothly established up to the patient pressure of 15 mmHg without change in vital signs. The abdominal cavity was surveilled.  There was no evidence of injury to surrounding viscera.  The patient was placed in Trendelenburg and rotated to the left.  Three additional 30mm trocars were placed, one in the left lower quadrant, one in the lower midline and one in the right lower quadrant.  The mass was palpable with atraumatic bowel grasper in the cecum.  This small bowel was reflected up out of the pelvis.  There was peritoneal adhesion from the appendix to the pelvic sidewall.  This was taken down with Harmonic scalpel.  I then incised along the white line of Toldt just below the appendix at the small bowel mesentery, at the sacrum laterally at the pelvic inlet.  I  then started taking up the ascending colon going up the white line of Toldt with Harmonic scalpel to medialize the colon.  When I was approaching the hepatic flexure, we then placed the patient in reverse Trendelenburg.  He had a prior laparoscopic cholecystectomy, so the omentum was a little bit fused to the transverse colon, this location, but nonetheless, I was able to get a window and take down the gastrocolic ligament starting in the midportion of the transverse colon and caring it out laterally toward the hepatic flexure.  This was done in sequential fashion using harmonic. The distal stomach and the duodenal  sweep was identified, taking care to avoid any thermal energy near the duodenum.  I continued to take down the hepatic flexure in sequential fashion with Harmonic scalpel.  At this point, the cecum was well mobilized and toward the midline.  At this point, I made a small supraumbilical incision extending toward the xiphoid with a 10-blade.  The length of the incision was about 6 cm. Subcutaneous tissue was divided with electrocautery.  The fascia was entered and taken down, and the abdominal cavity was entered.  In the bottom part, the GelPort wound protector was placed.  I was unable to completely bring out the cecum.  So, therefore, the GelPort was placed on top of the wound protector.  Pneumoperitoneum was re-established and I placed my hand inside the abdomen and then using laparoscopic assistance, I was able to free one or two remaining avascular attachments with Harmonic scalpel.  At this point, I was able to bring out the cecum through the wound protector.  The right colon and proximal transverse colon were well mobilized.  I decided to divide the terminal ileum about 6 cm from the ileocecal valve.  A small rent was made in the mesentery just next to the bowel wall.  It was then divided with a GIA- 75 stapler with a blue load.  I then took down some of the mesentery in sequential fashion using the LigaSure device.  I then found an area of the transverse colon and made a similar window just next to the bowel wall with the Bovie electrocautery just distal around the right branch of the middle colic vessel.  The bowel was divided with the GIA-75 stapler with a blue load.  I then started taking down the mesentery in sequential fashion using LigaSure device.  When I got down to the ileocolonic pedicle, this was clamped with a Claiborne Billings and then freed with the LigaSure.  The 2-0 silk tie was placed around the [facility] clamp and tied for hemostasis.  This had completely freed the specimen.  It  was passed off the field.  At this point, I inspected for any bleeding. There was no evidence of significant bleeding from the mesenteric edges or from the right upper quadrant or right pericolic gutter.  At this point, I brought the small bowel stapled end next to the transverse colon in a side-to-side fashion to create a functional end-to-end anastomosis. The corner of the small bowel staple line was excised.  Just based on the orientation of the transverse colon, I decided to make an enterotomy just in front of the staple line in the tenia coli.  One limb of GIA-75 stapler was placed through the ileum, stapled through the small bowel in and the other limb of the stapler was placed through the enterotomy in the transverse colon.  The staple was brought together and fired to create a common enterotomy.  The staple line was hemostatic.  Allis clamps were then placed along the common opening and a TA-60 stapler was used to close the common defect.  There was good blood flow to the anastomosis as evidenced by little bit of bleeding that was controlled with a 3-0 silk suture.  I then reinforced the staple line with several interrupted 3-0 silk sutures.  A crotch suture was placed as well with a 3-0 silk suture.  At this point, the bowel was returned to the abdomen. I did not close the mesenteric defect as it was quite large.  The abdomen was irrigated with saline.  I then placed the GelPort back on the wound protector, went back in laparoscopically and infiltrated a Tap block on both sides of the abdomen using laparoscopic guidance.  Exparel was infiltrated in the preperitoneal space around the transversus muscle.  The abdomen was irrigated again laparoscopically.  At this point, pneumoperitoneum was released.  The GelPort was removed in its entirety.  Instruments were passed off the field and we changed gown and gloves and Bovie and suction and redraped the patient.  I then closed the  mini-midline incision with a #0 looped PDS one from above, one from below and tied centrally.  I went back in laparoscopically to view the closure.  There was nothing within our closure.  There was no air leak. The fascia was well approximated.  Exparel was then infiltrated around the fascial closure.  Pneumoperitoneum was released for the final time. The subcutaneous tissue was irrigated with saline.  I then reapproximated the skin with skin staples.  The 4 trocar sites were closed with skin staples followed by application of a 2 x 2 and Tegaderm.  The honeycomb dressing was placed over the small midline incision.  At this point, I turned my attention to the scrotal cyst. The scrotum was prepped with Betadine.  I then elliptically excised the cyst in its entirety sharply using a 15-blade.  Marcaine with epinephrine was infiltrated for hemostasis.  I then closed the defect in the scrotal skin with several interrupted 3-0 chromic sutures followed by antibiotic ointment and scrotal support.  The patient was extubated and taken to the recovery room in stable condition.  There were no immediate complications.  The patient tolerated the procedure well.     Leighton Ruff. Redmond Pulling, MD, FACS     EMW/MEDQ  D:  07/26/2017  T:  07/27/2017  Job:  [id number]

## 2017-07-27 NOTE — Progress Notes (Signed)
1 Day Post-Op   Subjective/Chief Complaint: No n/v. No flatus. Pain ok. Has abd soreness.    Objective: Vital signs in last 24 hours: Temp:  [97.6 F (36.4 C)-99 F (37.2 C)] 99 F (37.2 C) (02/15 0629) Pulse Rate:  [56-65] 56 (02/15 0629) Resp:  [14-18] 15 (02/15 0629) BP: (129-201)/(53-84) 131/61 (02/15 0629) SpO2:  [98 %-100 %] 98 % (02/15 0629) Weight:  [97.6 kg (215 lb 1 oz)-100.7 kg (222 lb)] 97.6 kg (215 lb 1 oz) (02/15 0500) Last BM Date: 07/26/17  Intake/Output from previous day: 02/14 0701 - 02/15 0700 In: 3026.7 [P.O.:360; I.V.:2666.7] Out: 675 [Urine:575; Blood:100] Intake/Output this shift: Total I/O In: 360 [P.O.:360] Out: -   Sitting in chair, nad, nontoxic cta b/l Reg Soft, obese, hypoBS, mild approp TTP, dressing ok Foley out No edema, +SCDS  Lab Results:  Recent Labs    07/26/17 1935 07/27/17 0552  WBC 10.8* 5.9  HGB 11.0* 10.0*  HCT 34.3* 31.1*  PLT 129* 123*   BMET Recent Labs    07/26/17 1935 07/27/17 0552  NA 135 137  K 4.0 4.4  CL 104 105  CO2 22 24  GLUCOSE 190* 121*  BUN 8 9  CREATININE 1.17 0.96  CALCIUM 8.1* 8.0*   PT/INR No results for input(s): LABPROT, INR in the last 72 hours. ABG No results for input(s): PHART, HCO3 in the last 72 hours.  Invalid input(s): PCO2, PO2  Studies/Results: No results found.  Anti-infectives: Anti-infectives (From admission, onward)   Start     Dose/Rate Route Frequency Ordered Stop   07/26/17 1215  cefoTEtan in Dextrose 5% (CEFOTAN) IVPB 2 g     2 g Intravenous On call to O.R. 07/26/17 1201 07/26/17 1646      Assessment/Plan: s/p Procedure(s): LAPAROSCOPIC PARTIAL COLECTOMY, EXCISION SCROTAL CYST (N/A) CAD HTN Chronic anemia  Doing well. Cont clear liquid today Add ensure surgery Dec IVF Cont chemical vte prophylaxis ERAS meds Cont Home BP meds hgb stable Ambulate, pulm toilet reDiscussed typical postop course  LOS: 1 day    Greer Pickerel 07/27/2017

## 2017-07-28 LAB — MAGNESIUM: Magnesium: 1.8 mg/dL (ref 1.7–2.4)

## 2017-07-28 LAB — BASIC METABOLIC PANEL
Anion gap: 6 (ref 5–15)
BUN: 7 mg/dL (ref 6–20)
CALCIUM: 8.2 mg/dL — AB (ref 8.9–10.3)
CO2: 26 mmol/L (ref 22–32)
CREATININE: 0.89 mg/dL (ref 0.61–1.24)
Chloride: 105 mmol/L (ref 101–111)
Glucose, Bld: 122 mg/dL — ABNORMAL HIGH (ref 65–99)
Potassium: 4.2 mmol/L (ref 3.5–5.1)
Sodium: 137 mmol/L (ref 135–145)

## 2017-07-28 LAB — CBC
HCT: 30.3 % — ABNORMAL LOW (ref 39.0–52.0)
Hemoglobin: 9.4 g/dL — ABNORMAL LOW (ref 13.0–17.0)
MCH: 26.3 pg (ref 26.0–34.0)
MCHC: 31 g/dL (ref 30.0–36.0)
MCV: 84.9 fL (ref 78.0–100.0)
PLATELETS: 138 10*3/uL — AB (ref 150–400)
RBC: 3.57 MIL/uL — ABNORMAL LOW (ref 4.22–5.81)
RDW: 24.5 % — AB (ref 11.5–15.5)
WBC: 4.1 10*3/uL (ref 4.0–10.5)

## 2017-07-28 NOTE — Progress Notes (Signed)
[facility]:  [phone] General Surgery Progress Note   LOS: 2 days  POD -  2 Days Post-Op  Chief Complaint: Colon cancer  Assessment and Plan: 1.  LAPAROSCOPIC PARTIAL (right) COLECTOMY, EXCISION SCROTAL CYST - 07/26/2017 Luis [name]  On entereg  Doing okay.  Will advance to full liquids.  2.  Anemia -   Hgb - 9.4 - 07/28/2017 3.  DVT prophylaxis - Lovenox 4.  CAD 5.  HTN   Active Problems:   Cecal cancer (HCC)   Subjective:  Sorer today that yesterday.  He passed small flatus yesterday.  Voiding well.  Objective:   Vitals:   07/27/17 2118 07/28/17 0604  BP: (!) 131/59 120/64  Pulse: 70 82  Resp: 16 16  Temp: 99.8 F (37.7 C) 99.6 F (37.6 C)  SpO2: 100% 100%     Intake/Output from previous day:  02/15 0701 - 02/16 0700 In: 720 [P.O.:720] Out: 650 [Urine:650]  Intake/Output this shift:  No intake/output data recorded.   Physical Exam:   General: Obese WM who is alert and oriented.    HEENT: Normal. Pupils equal. .   Lungs: Clear.  IS - 2,000 cc   Abdomen: Mild distention.   Wound: Clean and covered   Lab Results:    Recent Labs    07/27/17 0552 07/28/17 0430  WBC 5.9 4.1  HGB 10.0* 9.4*  HCT 31.1* 30.3*  PLT 123* 138*    BMET   Recent Labs    07/27/17 0552 07/28/17 0430  NA 137 137  K 4.4 4.2  CL 105 105  CO2 24 26  GLUCOSE 121* 122*  BUN 9 7  CREATININE 0.96 0.89  CALCIUM 8.0* 8.2*    PT/INR  No results for input(s): LABPROT, INR in the last 72 hours.  ABG  No results for input(s): PHART, HCO3 in the last 72 hours.  Invalid input(s): PCO2, PO2   Studies/Results:  No results found.   Anti-infectives:   Anti-infectives (From admission, onward)   Start     Dose/Rate Route Frequency Ordered Stop   07/26/17 1215  cefoTEtan in Dextrose 5% (CEFOTAN) IVPB 2 g     2 g Intravenous On call to O.R. 07/26/17 1201 07/26/17 1646      Alphonsa Overall, MD, FACS Pager: [facility] Office:  [phone] 07/28/2017

## 2017-07-29 ENCOUNTER — Inpatient Hospital Stay (HOSPITAL_COMMUNITY): Payer: Medicare Other

## 2017-07-29 MED ORDER — ALBUTEROL SULFATE (2.5 MG/3ML) 0.083% IN NEBU
2.5000 mg | INHALATION_SOLUTION | RESPIRATORY_TRACT | Status: DC | PRN
  Administered 2017-07-29 – 2017-07-30 (×2): 2.5 mg via RESPIRATORY_TRACT
  Filled 2017-07-29 (×3): qty 3

## 2017-07-29 NOTE — Progress Notes (Signed)
[facility]:  [phone] General Surgery Progress Note   LOS: 3 days  POD -  3 Days Post-Op  Chief Complaint: Colon cancer  Assessment and Plan: 1.  LAPAROSCOPIC PARTIAL (right) COLECTOMY, EXCISION SCROTAL CYST - 07/26/2017 Redmond Pulling  On entereg  Feels better today.  Will advance to reg diet  2.  Anemia -   Hgb - 9.4 - 07/28/2017 3.  DVT prophylaxis - Lovenox 4.  CAD 5.  HTN   Active Problems:   Cecal cancer (HCC)   Subjective:  He had several BMs.  He feels much better than yesterday, but abdomen is still sore.  Objective:   Vitals:   07/28/17 2114 07/29/17 0520  BP: (!) 116/56 128/69  Pulse: 76 74  Resp: 16 18  Temp: 99.1 F (37.3 C) 99.1 F (37.3 C)  SpO2: 99% 99%     Intake/Output from previous day:  02/16 0701 - 02/17 0700 In: 640 [P.O.:240; I.V.:400] Out: 950 [Urine:950]  Intake/Output this shift:  No intake/output data recorded.   Physical Exam:   General: Obese WM who is alert and oriented.    HEENT: Normal. Pupils equal. .   Lungs: Clear.     Abdomen: Mild distention.  Has BS.   Wound: Clean and covered   Lab Results:    Recent Labs    07/27/17 0552 07/28/17 0430  WBC 5.9 4.1  HGB 10.0* 9.4*  HCT 31.1* 30.3*  PLT 123* 138*    BMET   Recent Labs    07/27/17 0552 07/28/17 0430  NA 137 137  K 4.4 4.2  CL 105 105  CO2 24 26  GLUCOSE 121* 122*  BUN 9 7  CREATININE 0.96 0.89  CALCIUM 8.0* 8.2*    PT/INR  No results for input(s): LABPROT, INR in the last 72 hours.  ABG  No results for input(s): PHART, HCO3 in the last 72 hours.  Invalid input(s): PCO2, PO2   Studies/Results:  No results found.   Anti-infectives:   Anti-infectives (From admission, onward)   Start     Dose/Rate Route Frequency Ordered Stop   07/26/17 1215  cefoTEtan in Dextrose 5% (CEFOTAN) IVPB 2 g     2 g Intravenous On call to O.R. 07/26/17 1201 07/26/17 1646      Alphonsa Overall, MD, FACS Pager: [place]  [facility]: [phone] 07/29/2017

## 2017-07-30 DIAGNOSIS — D869 Sarcoidosis, unspecified: Secondary | ICD-10-CM

## 2017-07-30 DIAGNOSIS — J209 Acute bronchitis, unspecified: Secondary | ICD-10-CM

## 2017-07-30 DIAGNOSIS — C180 Malignant neoplasm of cecum: Principal | ICD-10-CM

## 2017-07-30 LAB — CBC WITH DIFFERENTIAL/PLATELET
BASOS ABS: 0 10*3/uL (ref 0.0–0.1)
BASOS PCT: 0 %
EOS ABS: 0 10*3/uL (ref 0.0–0.7)
Eosinophils Relative: 0 %
HCT: 28.3 % — ABNORMAL LOW (ref 39.0–52.0)
HEMOGLOBIN: 9.2 g/dL — AB (ref 13.0–17.0)
LYMPHS PCT: 3 %
Lymphs Abs: 0.2 10*3/uL — ABNORMAL LOW (ref 0.7–4.0)
MCH: 26.9 pg (ref 26.0–34.0)
MCHC: 32.5 g/dL (ref 30.0–36.0)
MCV: 82.7 fL (ref 78.0–100.0)
Monocytes Absolute: 0.7 10*3/uL (ref 0.1–1.0)
Monocytes Relative: 10 %
NEUTROS PCT: 87 %
Neutro Abs: 6.3 10*3/uL (ref 1.7–7.7)
Platelets: 147 10*3/uL — ABNORMAL LOW (ref 150–400)
RBC: 3.42 MIL/uL — AB (ref 4.22–5.81)
RDW: 25.1 % — ABNORMAL HIGH (ref 11.5–15.5)
WBC: 7.2 10*3/uL (ref 4.0–10.5)

## 2017-07-30 LAB — EXPECTORATED SPUTUM ASSESSMENT W REFEX TO RESP CULTURE

## 2017-07-30 LAB — EXPECTORATED SPUTUM ASSESSMENT W GRAM STAIN, RFLX TO RESP C

## 2017-07-30 MED ORDER — IPRATROPIUM-ALBUTEROL 0.5-2.5 (3) MG/3ML IN SOLN
3.0000 mL | Freq: Four times a day (QID) | RESPIRATORY_TRACT | Status: DC
  Administered 2017-07-30 – 2017-07-31 (×4): 3 mL via RESPIRATORY_TRACT
  Filled 2017-07-30 (×4): qty 3

## 2017-07-30 MED ORDER — AMOXICILLIN-POT CLAVULANATE 875-125 MG PO TABS
1.0000 | ORAL_TABLET | Freq: Two times a day (BID) | ORAL | Status: DC
  Administered 2017-07-30 – 2017-07-31 (×2): 1 via ORAL
  Filled 2017-07-30 (×3): qty 1

## 2017-07-30 MED ORDER — BUDESONIDE 0.5 MG/2ML IN SUSP
0.5000 mg | Freq: Two times a day (BID) | RESPIRATORY_TRACT | Status: DC
  Administered 2017-07-30 – 2017-07-31 (×2): 0.5 mg via RESPIRATORY_TRACT
  Filled 2017-07-30 (×2): qty 2

## 2017-07-30 MED ORDER — AZITHROMYCIN 250 MG PO TABS: 500.0000 mg | ORAL_TABLET | Freq: Every day | ORAL | Status: DC

## 2017-07-30 MED ORDER — GUAIFENESIN ER 600 MG PO TB12
600.0000 mg | ORAL_TABLET | Freq: Two times a day (BID) | ORAL | Status: DC
  Administered 2017-07-30 – 2017-07-31 (×3): 600 mg via ORAL
  Filled 2017-07-30 (×3): qty 1

## 2017-07-30 NOTE — Consult Note (Signed)
Name: Luis [name] MRN: [id number] DOB: 12-Jun-1941    ADMISSION DATE:  07/26/2017 CONSULTATION DATE:  07/30/17  REFERRING MD :  Dr. Greer Pickerel / [facility]   CHIEF COMPLAINT:  Cough, sputum production    HISTORY OF PRESENT ILLNESS:  77 y/o M admitted 2/14 for planned laparoscopic right colectomy in the setting of cecal cancer.    He is followed by Dr. Carlean Purl and Dr. Redmond Pulling as an outpatient.  He recently had noted blood in his stool.   Initially, this was thought to be related to hemorrhoidal bleeding.  Follow up lab work noted a drop in his hemoglobin.  Given change in Hgb, his colonoscopy was moved up to 1/9 which showed an ulcerated cecal mass.  Biopsy was consistent with adenocarcinoma.  CT of the chest / abdomen / pelvis 06/22/17 demonstrated no evidence of metastatic disease.  On 2/14, he underwent planned laparoscopic right colectomy which he tolerated well.   On 2/17, he developed increased shortness of breath, productive cough with green sputum.    PCCM consulted for evaluation of SOB, cough.   In regards to pulmonary history, he reports he has had several jobs to Mining engineer, Lobbyist, Scientist, forensic, labs and maintenance work at [employer] (currently works part time).  He is originally from [place].  He began smoking at age 52 and quit in 1998.  He began smoking again but quit smoking cigarettes again and currently smokes cigars.  He was followed by Dr. Joya Gaskins in the late 90's for sarcoidosis (pt reports biopsy proven) and was released from pulmonary care in 2001.   He reports he has had PFT's in the remote past but they were "better than most men his age".    He reports after surgery he felt well but the second day the "bottom fell out".  He developed wheezing, cough with thick green sputum production.  He had one episode of small volume vomiting but did not look at the color.  He denies fevers but has had chills.  States he has felt short of breath since POD 2. CXR  evaluated shows old scarring, no new infiltrate (2/17).  Reports normal BM / PO intake currently.    PAST MEDICAL HISTORY :   has a past medical history of Adenocarcinoma of cecum ([place]) (06/21/2017), Arthritis, Basal cell carcinoma, CAD (coronary artery disease), Chronic bronchitis ([place]), Diverticulosis (05/2005), Elevated lipase, Fuchs' corneal dystrophy, History of adenomatous polyp of colon (05/2005), History of blood transfusion, Hyperlipidemia, Hypertension, Iron deficiency anemia due to chronic blood loss (06/21/2017), Prostate cancer ([place]) (2011), Sarcoidosis, Thrombocytopenia ([place]), and Trifascicular block.   has a past surgical history that includes Thorocotomy with lobectomy (Right, ~ 1991); Colonoscopy w/ polypectomy (06/08/2005 and 10/18/2010); Eye surgery; Cataract extraction w/ intraocular lens  implant, bilateral (Bilateral, ~ 2011-2012); Lymph node biopsy; Insertion prostate radiation seed (~ 2011); Corneal transplant (Bilateral, ~ 2011-2012); Finger surgery (Right, 2014); left heart catheterization with coronary angiogram (N/A, 08/12/2014); Coronary angioplasty with stent (08/2014; 02/24/2015); Tonsillectomy (~ 1950); Laparoscopic cholecystectomy (2015); Mohs surgery (Right, ~ 2011); Excision basal cell carcinoma (Left); Cardiac catheterization (N/A, 02/24/2015); Cardiac catheterization (02/24/2015); and Laparoscopic partial colectomy (N/A, 07/26/2017).  Prior to Admission medications   Medication Sig Start Date End Date Taking? Authorizing Provider  ASPIRIN 81 PO Take 81 mg by mouth daily.   Yes [provider]  Cholecalciferol (VITAMIN D3) 2000 units capsule Take 2,000 Units by mouth daily.    Yes [provider]  clotrimazole (LOTRIMIN) 1 %  cream Apply 1 application topically 2 (two) times daily as needed (for rash).   Yes [provider]  fluocinonide ointment (LIDEX) 5.09 % Apply 1 application topically daily as needed (for rash).    Yes [provider]    glucosamine-chondroitin 500-400 MG tablet Take 1 tablet by mouth daily.   Yes [provider]  metoprolol tartrate (LOPRESSOR) 25 MG tablet Take 0.5 tablets (12.5 mg total) by mouth 2 (two) times daily. 02/23/17  Yes Theora Gianotti, NP  Multiple Vitamin (MULTIVITAMIN WITH MINERALS) TABS tablet Take 1 tablet by mouth every evening.   Yes [provider]  naproxen sodium (ALEVE) 220 MG tablet Take 440-660 mg by mouth daily as needed (for pain).   Yes [provider]  nitroGLYCERIN (NITROSTAT) 0.4 MG SL tablet Place 0.4 mg under the tongue every 5 (five) minutes as needed for chest pain. X 3 doses   Yes [provider]  Omega-3 Fatty Acids (FISH OIL PO) Take 1,400 mg by mouth daily.    Yes [provider]  OVER THE COUNTER MEDICATION Take 10 mg by mouth 2 (two) times daily. CBD 10 mg Supplement   Yes [provider]  rosuvastatin (CRESTOR) 20 MG tablet Take 1 tablet (20 mg total) by mouth daily. 02/19/17  Yes Jettie Booze, MD  sildenafil (REVATIO) 20 MG tablet Take 60 mg by mouth as needed (for ED).    Yes [provider]  silodosin (RAPAFLO) 8 MG CAPS capsule Take 8 mg by mouth daily as needed (for urination).    Yes [provider]  traMADol (ULTRAM) 50 MG tablet Take 50-100 mg by mouth 4 (four) times daily as needed for moderate pain.    Yes [provider]  clopidogrel (PLAVIX) 75 MG tablet Take 1 tablet (75 mg total) by mouth daily with breakfast. 05/29/17   Leonie Man, MD  metroNIDAZOLE (FLAGYL) 500 MG tablet Take 1,000 mg by mouth See admin instructions. Take 1000 mg by mouth at 1400, 1500 and 2200 the day prior to surgery 07/05/17   [provider]  neomycin (MYCIFRADIN) 500 MG tablet Take 1,000 mg by mouth See admin instructions. Take 1000 mg by mouth at 1400, 1500 and 2200 the day prior to surgery 07/05/17   [provider]    No Known Allergies  FAMILY HISTORY:  family  history includes Coronary artery disease (age of onset: 55) in his sister; Heart attack in his sister; Heart disease (age of onset: 7) in his father.  SOCIAL HISTORY:  reports that he has been smoking cigars.  He has been smoking about 0.00 packs per day for the past 59.00 years. he has never used smokeless tobacco. He reports that he drinks about 1.2 oz of alcohol per week. He reports that he does not use drugs.  REVIEW OF SYSTEMS:  POSITIVES IN BOLD Constitutional: Negative for fever, chills, weight loss, malaise/fatigue and diaphoresis.  HENT: Negative for hearing loss, ear pain, nosebleeds, congestion, sore throat, neck pain, tinnitus and ear discharge.   Eyes: Negative for blurred vision, double vision, photophobia, pain, discharge and redness.  Respiratory: Negative for cough, hemoptysis, green sputum production, shortness of breath, wheezing and stridor.   Cardiovascular: Negative for chest pain, palpitations, orthopnea, claudication, leg swelling and PND.  Gastrointestinal: Negative for heartburn, nausea, vomiting, abdominal pain, diarrhea, constipation, blood in stool and melena.  Genitourinary: Negative for dysuria, urgency, frequency, hematuria and flank pain.  Musculoskeletal: Negative for myalgias, back pain, joint pain and  falls.  Skin: Negative for itching and rash.  Neurological: Negative for dizziness, tingling, tremors, sensory change, speech change, focal weakness, seizures, loss of consciousness, weakness and headaches.  Endo/Heme/Allergies: Negative for environmental allergies and polydipsia. Does not bruise/bleed easily.   SUBJECTIVE:   VITAL SIGNS: Temp:  [98.1 F (36.7 C)-100.6 F (38.1 C)] 98.8 F (37.1 C) (02/18 0914) Pulse Rate:  [69-102] 77 (02/18 0914) Resp:  [18-24] 18 (02/18 0914) BP: (111-180)/(47-70) 135/68 (02/18 0914) SpO2:  [97 %-100 %] 100 % (02/18 0914) Weight:  [211 lb 11.2 oz (96 kg)] 211 lb 11.2 oz (96 kg) (02/18 0519)  PHYSICAL  EXAMINATION: General: well developed elderly male in NAD  HEENT: MM pink/moist, no jvd Neuro: AAOx4, speech clear, MAE  CV: s1s2 rrr, no m/r/g PULM: even/non-labored, coarse with occasional rhonchi bilaterally GI: soft, bsx4 active, midline abd incision with waffle dressing, lap site c/d/i  Extremities: warm/dry, no edema  Skin: no rashes or lesions   Recent Labs  Lab 07/26/17 1935 07/27/17 0552 07/28/17 0430  NA 135 137 137  K 4.0 4.4 4.2  CL 104 105 105  CO2 22 24 26   BUN 8 9 7   CREATININE 1.17 0.96 0.89  GLUCOSE 190* 121* 122*    Recent Labs  Lab 07/27/17 0552 07/28/17 0430 07/30/17 0633  HGB 10.0* 9.4* 9.2*  HCT 31.1* 30.3* 28.3*  WBC 5.9 4.1 7.2  PLT 123* 138* 147*    Dg Chest 2 View  Result Date: 07/29/2017 CLINICAL DATA:  Productive cough. Shortness of breath. Colectomy 3 days ago. EXAM: CHEST  2 VIEW COMPARISON:  Chest CT 06/22/2017 FINDINGS: Unchanged heart size and mediastinal contours. Calcified mediastinal hilar lymph nodes again seen. Surgical change in the right hemithorax with chain suture and surgical clips. Scattered right lung scarring appears similar to prior. Scattered calcified granulomas. No pulmonary edema. No pleural effusion. No pneumothorax. No acute osseous abnormalities are seen. IMPRESSION: Unchanged appearance of the chest with right lung scarring and postsurgical change. Sequela of prior granulomatous disease. No acute abnormalities. Electronically Signed   By: Jeb Levering M.D.   On: 07/29/2017 22:48      SIGNIFICANT EVENTS  2/14  Admit for planned laparoscopic right colectomy 2/17  Pt developed increased SOB, cough with green sputum production  2/18  PCCM consulted    STUDIES CT Chest / ABD / Pelvis 1/11 >> large mass like appearance in the cecum, extensive findings of old granulomatous disease with calcified mediastinal & hilar / perihilar lymph nodes, no evidence fo distant metastatic disease  CULTURES Sputum 2/18 >>    ANTIBIOTICS  Azithromycin 2/18 >>    ASSESSMENT / PLAN:  Discussion:  77 y/o M admitted 2/14 for planned laparoscopic right colectomy in the setting of ulcerated cecal mass consistent with adenocarcinoma.  POD 3/4 developed increased SOB, wheezing & cough with productive green sputum.  CXR does not show acute infiltrate.  Suspect this is acute bronchitis vs AECOPD.   Productive Cough - CXR negative  Dyspnea  Suspected COPD  Sarcoidosis  Granulomatous Lung Disease - suspect secondary to sarcoidosis, prior occupational exposures Tobacco Abuse  Plan: Azithromycin 500 mg PO x5 days  Mucinex BID Duoneb Q6 for next 24 hours > doubt will need at discharge  Aspiration precautions Plan for follow up pulmonary evaluation for obstructive lung disease with PFT's Smoking cessation counseling    Ulcerated Cecal Mass s/p R Colectomy   Plan: Post-operative care per Primary   Thank you for the consultation.  PCCM will  follow up 2/19.    Noe Gens, NP-C [facility] Pgr: [phone] or if no answer [phone] 07/30/2017, 1:02 PM

## 2017-07-30 NOTE — Discharge Instructions (Signed)
SURGERY: POST OP INSTRUCTIONS (Surgery for small bowel obstruction, colon resection, etc)   ######################################################################  EAT Gradually transition to a high fiber diet with a fiber supplement over the next few days after discharge  WALK Walk an hour a day.  Control your pain to do that.    CONTROL PAIN Control pain so that you can walk, sleep, tolerate sneezing/coughing, go up/down stairs.  HAVE A BOWEL MOVEMENT DAILY Keep your bowels regular to avoid problems.  OK to try a laxative (Miralax) to override constipation.  OK to use an antidairrheal to slow down diarrhea.  Call if not better after 2 tries  CALL IF YOU HAVE PROBLEMS/CONCERNS Call if you are still struggling despite following these instructions. Call if you have concerns not answered by these instructions  ######################################################################   DIET Follow a light diet the first few days at home.  Start with a bland diet such as soups, liquids, starchy foods, low fat foods, etc.  If you feel full, bloated, or constipated, stay on a ful liquid or pureed/blenderized diet for a few days until you feel better and no longer constipated. Be sure to drink plenty of fluids every day to avoid getting dehydrated (feeling dizzy, not urinating, etc.). Gradually add a fiber supplement to your diet over the next week.  Gradually get back to a regular solid diet.  Avoid fast food or heavy meals the first week as you are more likely to get nauseated. It is expected for your digestive tract to need a few months to get back to normal.  It is common for your bowel movements and stools to be irregular.  You will have occasional bloating and cramping that should eventually fade away.  Until you are eating solid food normally, off all pain medications, and back to regular activities; your bowels will not be normal. Focus on eating a low-fat, high fiber diet the rest of  your life (See Getting to [place], below).  CARE of your INCISION or WOUND It is good for closed incision and even open wounds to be washed every day.  Shower every day.  Short baths are fine.  Wash the incisions and wounds clean with soap & water.    If you have a closed incision(s), wash the incision with soap & water every day.  You may leave closed incisions open to air if it is dry.   You may cover the incision with clean gauze & replace it after your daily shower for comfort. If you have skin tapes (Steristrips) or skin glue (Dermabond) on your incision, leave them in place.  They will fall off on their own like a scab.  You may trim any edges that curl up with clean scissors.  If you have staples, set up an appointment for them to be removed in the office in 10 days after surgery.  If you have a drain, wash around the skin exit site with soap & water and place a new dressing of gauze or band aid around the skin every day.  Keep the drain site clean & dry.    If you have an open wound with packing, see wound care instructions.  In general, it is encouraged that you remove your dressing and packing, shower with soap & water, and replace your dressing once a day.  Pack the wound with clean gauze moistened with normal (0.9%) saline to keep the wound moist & uninfected.  Pressure on the dressing for 30 minutes will stop most  wound bleeding.  Eventually your body will heal & pull the open wound closed over the next few months.  Raw open wounds will occasionally bleed or secrete yellow drainage until it heals closed.  Drain sites will drain a little until the drain is removed.  Even closed incisions can have mild bleeding or drainage the first few days until the skin edges scab over & seal.   If you have an open wound with a wound vac, see wound vac care instructions.     ACTIVITIES as tolerated Start light daily activities --- self-care, walking, climbing stairs-- beginning the day after  surgery.  Gradually increase activities as tolerated.  Control your pain to be active.  Stop when you are tired.  Ideally, walk several times a day, eventually an hour a day.   Most people are back to most day-to-day activities in a few weeks.  It takes 4-8 weeks to get back to unrestricted, intense activity. If you can walk 30 minutes without difficulty, it is safe to try more intense activity such as jogging, treadmill, bicycling, low-impact aerobics, swimming, etc. Save the most intensive and strenuous activity for last (Usually 4-8 weeks after surgery) such as sit-ups, heavy lifting, contact sports, etc.  Refrain from any intense heavy lifting or straining until you are off narcotics for pain control.  You will have off days, but things should improve week-by-week. DO NOT PUSH THROUGH PAIN.  Let pain be your guide: If it hurts to do something, don't do it.  Pain is your body warning you to avoid that activity for another week until the pain goes down. You may drive when you are no longer taking narcotic prescription pain medication, you can comfortably wear a seatbelt, and you can safely make sudden turns/stops to protect yourself without hesitating due to pain. You may have sexual intercourse when it is comfortable. If it hurts to do something, stop.  MEDICATIONS Take your usually prescribed home medications unless otherwise directed.   Blood thinners:  Usually you can restart any strong blood thinners after the second postoperative day.  It is OK to take aspirin right away.     If you are on strong blood thinners (warfarin/Coumadin, Plavix, Xerelto, Eliquis, Pradaxa, etc), discuss with your surgeon, medicine PCP, and/or cardiologist for instructions on when to restart the blood thinner & if blood monitoring is needed (PT/INR blood check, etc).     PAIN CONTROL Pain after surgery or related to activity is often due to strain/injury to muscle, tendon, nerves and/or incisions.  This pain is  usually short-term and will improve in a few months.  To help speed the process of healing and to get back to regular activity more quickly, DO THE FOLLOWING THINGS TOGETHER: 1. Increase activity gradually.  DO NOT PUSH THROUGH PAIN 2. Use Ice and/or Heat 3. Try Gentle Massage and/or Stretching 4. Take over the counter pain medication 5. Take Narcotic prescription pain medication for more severe pain  Good pain control = faster recovery.  It is better to take more medicine to be more active than to stay in bed all day to avoid medications. 1.  Increase activity gradually Avoid heavy lifting at first, then increase to lifting as tolerated over the next 6 weeks. Do not push through the pain.  Listen to your body and avoid positions and maneuvers than reproduce the pain.  Wait a few days before trying something more intense Walking an hour a day is encouraged to help your body recover  faster and more safely.  Start slowly and stop when getting sore.  If you can walk 30 minutes without stopping or pain, you can try more intense activity (running, jogging, aerobics, cycling, swimming, treadmill, sex, sports, weightlifting, etc.) Remember: If it hurts to do it, then dont do it! 2. Use Ice and/or Heat You will have swelling and bruising around the incisions.  This will take several weeks to resolve. Ice packs or heating pads (6-8 times a day, 30-60 minutes at a time) will help sooth soreness & bruising. Some people prefer to use ice alone, heat alone, or alternate between ice & heat.  Experiment and see what works best for you.  Consider trying ice for the first few days to help decrease swelling and bruising; then, switch to heat to help relax sore spots and speed recovery. Shower every day.  Short baths are fine.  It feels good!  Keep the incisions and wounds clean with soap & water.   3. Try Gentle Massage and/or Stretching Massage at the area of pain many times a day Stop if you feel pain - do  not overdo it 4. Take over the counter pain medication This helps the muscle and nerve tissues become less irritable and calm down faster Over-the-counter anti-inflammatory medications: Acetaminophen 500mg  tabs (Tylenol) 1-2 pills with every meal and just before bedtime (avoid if you have liver problems or if you have acetaminophen in you narcotic prescription)  Ibuprofen 200mg  tabs (ex. Advil, Motrin) 3-4 pills with every meal and just before bedtime (avoid if you have kidney, stomach, IBD, or bleeding problems) Take with food/snack several times a day as directed for at least 2 weeks to help keep pain / soreness down & more manageable. 5. Take Narcotic prescription pain medication for more severe pain A prescription for strong pain control is often given to you upon discharge (for example: oxycodone/Percocet, hydrocodone/Norco/Vicodin, or tramadol/Ultram) Take your pain medication as prescribed. Be mindful that most narcotic prescriptions contain Tylenol (acetaminophen) as well - avoid taking too much Tylenol. If you are having problems/concerns with the prescription medicine (does not control pain, nausea, vomiting, rash, itching, etc.), please call us [phone] to see if we need to switch you to a different pain medicine that will work better for you and/or control your side effects better. If you need a refill on your pain medication, you must call the office before 4 pm and on weekdays only.  By federal law, prescriptions for narcotics cannot be called into a pharmacy.  They must be filled out on paper & picked up from our office by the patient or authorized caretaker.  Prescriptions cannot be filled after 4 pm nor on weekends.    WHEN TO CALL us [phone] Severe uncontrolled or worsening pain  Fever over 101 F (38.5 C) Concerns with the incision: Worsening pain, redness, rash/hives, swelling, bleeding, or drainage Reactions / problems with new medications (itching, rash, hives,  nausea, etc.) Nausea and/or vomiting Difficulty urinating Difficulty breathing Worsening fatigue, dizziness, lightheadedness, blurred vision Other concerns  The clinic staff is available to answer your questions during regular business hours (8:30am-5pm).  Please dont hesitate to call and ask to speak to one of our nurses for clinical concerns.    A surgeon from [facility] is always on call at the hospitals 24 hours/day If you have a medical emergency, go to the nearest emergency room or call 911.  FOLLOW UP in our office One the day of your discharge  from the hospital (or the next business weekday), please call [facility] to set up or confirm an appointment to see your surgeon in the office for a follow-up appointment.  Usually it is 2-3 weeks after your surgery.   If you have skin staples at your incision(s), let the office know so we can set up a time in the office for the nurse to remove them (usually around 10 days after surgery). Make sure that you call for appointments the day of discharge (or the next business weekday) from the hospital to ensure a convenient appointment time. IF YOU HAVE DISABILITY OR FAMILY LEAVE FORMS, BRING THEM TO THE OFFICE FOR PROCESSING.  DO NOT GIVE THEM TO YOUR DOCTOR.  [facility] [address], [place], [place], [place]  [address] ? [phone] - Main [phone] - [place],  [phone] - Fax [email or web address]  GETTING TO GOOD BOWEL HEALTH. It is expected for your digestive tract to need a few months to get back to normal.  It is common for your bowel movements and stools to be irregular.  You will have occasional bloating and cramping that should eventually fade away.  Until you are eating solid food normally, off all pain medications, and back to regular activities; your bowels will not be normal.   Avoiding constipation The goal: ONE SOFT BOWEL MOVEMENT A DAY!    Drink  plenty of fluids.  Choose water first. TAKE A FIBER SUPPLEMENT EVERY DAY THE REST OF YOUR LIFE During your first week back home, gradually add back a fiber supplement every day Experiment which form you can tolerate.   There are many forms such as powders, tablets, wafers, gummies, etc Psyllium bran (Metamucil), methylcellulose (Citrucel), Miralax or Glycolax, Benefiber, Flax Seed.  Adjust the dose week-by-week (1/2 dose/day to 6 doses a day) until you are moving your bowels 1-2 times a day.  Cut back the dose or try a different fiber product if it is giving you problems such as diarrhea or bloating. Sometimes a laxative is needed to help jump-start bowels if constipated until the fiber supplement can help regulate your bowels.  If you are tolerating eating & you are farting, it is okay to try a gentle laxative such as double dose MiraLax, prune juice, or Milk of Magnesia.  Avoid using laxatives too often. Stool softeners can sometimes help counteract the constipating effects of narcotic pain medicines.  It can also cause diarrhea, so avoid using for too long. If you are still constipated despite taking fiber daily, eating solids, and a few doses of laxatives, call our office. Controlling diarrhea Try drinking liquids and eating bland foods for a few days to avoid stressing your intestines further. Avoid dairy products (especially milk & ice cream) for a short time.  The intestines often can lose the ability to digest lactose when stressed. Avoid foods that cause gassiness or bloating.  Typical foods include beans and other legumes, cabbage, broccoli, and dairy foods.  Avoid greasy, spicy, fast foods.  Every person has some sensitivity to other foods, so listen to your body and avoid those foods that trigger problems for you. Probiotics (such as active yogurt, Align, etc) may help repopulate the intestines and colon with normal bacteria and calm down a sensitive digestive tract Adding a fiber  supplement gradually can help thicken stools by absorbing excess fluid and retrain the intestines to act more normally.  Slowly increase the dose over a few weeks.  Too much  fiber too soon can backfire and cause cramping & bloating. It is okay to try and slow down diarrhea with a few doses of antidiarrheal medicines.   Bismuth subsalicylate (ex. Kayopectate, Pepto Bismol) for a few doses can help control diarrhea.  Avoid if pregnant.   Loperamide (Imodium) can slow down diarrhea.  Start with one tablet (2mg ) first.  Avoid if you are having fevers or severe pain.  ILEOSTOMY PATIENTS WILL HAVE CHRONIC DIARRHEA since their colon is not in use.    Drink plenty of liquids.  You will need to drink even more glasses of water/liquid a day to avoid getting dehydrated. Record output from your ileostomy.  Expect to empty the bag every 3-4 hours at first.  Most people with a permanent ileostomy empty their bag 4-6 times at the least.   Use antidiarrheal medicine (especially Imodium) several times a day to avoid getting dehydrated.  Start with a dose at bedtime & breakfast.  Adjust up or down as needed.  Increase antidiarrheal medications as directed to avoid emptying the bag more than 8 times a day (every 3 hours). Work with your wound ostomy nurse to learn care for your ostomy.  See ostomy care instructions. TROUBLESHOOTING IRREGULAR BOWELS 1) Start with a soft & bland diet. No spicy, greasy, or fried foods.  2) Avoid gluten/wheat or dairy products from diet to see if symptoms improve. 3) Miralax 17gm or flax seed mixed in Tolu. water or juice-daily. May use 2-4 times a day as needed. 4) Gas-X, Phazyme, etc. as needed for gas & bloating.  5) Prilosec (omeprazole) over-the-counter as needed 6)  Consider probiotics (Align, Activa, etc) to help calm the bowels down  Call your doctor if you are getting worse or not getting better.  Sometimes further testing (cultures, endoscopy, X-ray studies, CT scans, bloodwork,  etc.) may be needed to help diagnose and treat the cause of the diarrhea. [facility], [address], [place], [place], [place]  [address] [phone] - Main.    [phone]  - Toll Free.   [phone] - Fax [email or web address]

## 2017-07-30 NOTE — Care Management Important Message (Signed)
Important Message  Patient Details  Name: Luis [name] MRN: [id number] Date of Birth: August 13, 1940   Medicare Important Message Given:  Yes    Kerin Salen 07/30/2017, 10:30 AMImportant Message  Patient Details  Name: Luis [name] MRN: [id number] Date of Birth: 19-Feb-1941   Medicare Important Message Given:  Yes    Kerin Salen 07/30/2017, 10:30 AM

## 2017-07-30 NOTE — Progress Notes (Addendum)
4 Days Post-Op   Subjective/Chief Complaint: Didn't walk yesterday; had SOB/ "trouble breathing"; productive cough with greenish sputum; was able to get up to 2200 on IS yesterday, so far today about 1300 Had 1 episode of vomiting yesterday am none since then +loose BMs. Min burping +flatus hasnt walked today yet   Objective: Vital signs in last 24 hours: Temp:  [98.1 F (36.7 C)-100.6 F (38.1 C)] 98.8 F (37.1 C) (02/18 0914) Pulse Rate:  [69-102] 77 (02/18 0914) Resp:  [18-24] 18 (02/18 0914) BP: (111-180)/(47-70) 135/68 (02/18 0914) SpO2:  [97 %-100 %] 100 % (02/18 0914) Weight:  [96 kg (211 lb 11.2 oz)] 96 kg (211 lb 11.2 oz) (02/18 0519) Last BM Date: 07/28/17  Intake/Output from previous day: 02/17 0701 - 02/18 0700 In: 400 [I.V.:400] Out: 600 [Urine:600] Intake/Output this shift: Total I/O In: 240 [P.O.:240] Out: -   Sitting in chair, nad, nontoxic Occasional productive cough, greenish sputum Some coarse BS on expiration Reg Soft, obese, incisions ok, min TTP +SCDs, no edema  Lab Results:  Recent Labs    07/28/17 0430 07/30/17 0633  WBC 4.1 7.2  HGB 9.4* 9.2*  HCT 30.3* 28.3*  PLT 138* 147*   BMET Recent Labs    07/28/17 0430  NA 137  K 4.2  CL 105  CO2 26  GLUCOSE 122*  BUN 7  CREATININE 0.89  CALCIUM 8.2*   PT/INR No results for input(s): LABPROT, INR in the last 72 hours. ABG No results for input(s): PHART, HCO3 in the last 72 hours.  Invalid input(s): PCO2, PO2  Studies/Results: Dg Chest 2 View  Result Date: 07/29/2017 CLINICAL DATA:  Productive cough. Shortness of breath. Colectomy 3 days ago. EXAM: CHEST  2 VIEW COMPARISON:  Chest CT 06/22/2017 FINDINGS: Unchanged heart size and mediastinal contours. Calcified mediastinal hilar lymph nodes again seen. Surgical change in the right hemithorax with chain suture and surgical clips. Scattered right lung scarring appears similar to prior. Scattered calcified granulomas. No pulmonary  edema. No pleural effusion. No pneumothorax. No acute osseous abnormalities are seen. IMPRESSION: Unchanged appearance of the chest with right lung scarring and postsurgical change. Sequela of prior granulomatous disease. No acute abnormalities. Electronically Signed   By: Jeb Levering M.D.   On: 07/29/2017 22:48    Anti-infectives: Anti-infectives (From admission, onward)   Start     Dose/Rate Route Frequency Ordered Stop   07/26/17 1215  cefoTEtan in Dextrose 5% (CEFOTAN) IVPB 2 g     2 g Intravenous On call to O.R. 07/26/17 1201 07/26/17 1646      Assessment/Plan: s/p Procedure(s): LAPAROSCOPIC PARTIAL COLECTOMY, EXCISION SCROTAL CYST (N/A) LAPAROSCOPIC PARTIAL COLECTOMY, EXCISION SCROTAL CYST (N/A) CAD HTN Chronic anemia  Low grade temp overnight, productive cough but no overt evidence of PNA on CXR, no edema on exam Send sputum for cx Will ask pulm to see pt - (would like to avoid prednisone if able too) Cont pulm toilet Add mucinex Cont diet as tolerated Cont chemical vte prophylaxis Not ready for dc today Hopefully Tuesday  Leighton Ruff. Redmond Pulling, MD, [place], Bariatric, & Minimally Invasive Surgery [facility], [place]   LOS: 4 days    Greer Pickerel 07/30/2017

## 2017-07-31 MED ORDER — FLUTICASONE PROPIONATE HFA 110 MCG/ACT IN AERO: 2.0000 | INHALATION_SPRAY | Freq: Two times a day (BID) | RESPIRATORY_TRACT | 2 refills | Status: DC

## 2017-07-31 MED ORDER — AMOXICILLIN-POT CLAVULANATE 875-125 MG PO TABS: 1.0000 | ORAL_TABLET | Freq: Two times a day (BID) | ORAL | 0 refills | Status: DC

## 2017-07-31 MED ORDER — DEXTROMETHORPHAN POLISTIREX ER 30 MG/5ML PO SUER: 30.0000 mg | Freq: Every evening | ORAL | 0 refills | Status: DC | PRN

## 2017-07-31 MED ORDER — GUAIFENESIN ER 600 MG PO TB12: 600.0000 mg | ORAL_TABLET | Freq: Two times a day (BID) | ORAL | 0 refills | Status: DC | PRN

## 2017-07-31 NOTE — Progress Notes (Signed)
Name: Luis [name] MRN: [id number] DOB: 1940-11-18    ADMISSION DATE:  07/26/2017 CONSULTATION DATE:  07/30/17  REFERRING MD :  Dr. Greer Pickerel / [facility]   CHIEF COMPLAINT:  Cough, sputum production    HISTORY OF PRESENT ILLNESS:  77 y/o M admitted 2/14 for planned laparoscopic right colectomy in the setting of cecal cancer.    He is followed by Dr. Carlean Purl and Dr. Redmond Pulling as an outpatient.  He recently had noted blood in his stool.   Initially, this was thought to be related to hemorrhoidal bleeding.  Follow up lab work noted a drop in his hemoglobin.  Given change in Hgb, his colonoscopy was moved up to 1/9 which showed an ulcerated cecal mass.  Biopsy was consistent with adenocarcinoma.  CT of the chest / abdomen / pelvis 06/22/17 demonstrated no evidence of metastatic disease.  On 2/14, he underwent planned laparoscopic right colectomy which he tolerated well.   On 2/17, he developed increased shortness of breath, productive cough with green sputum.    PCCM consulted for evaluation of SOB, cough.   In regards to pulmonary history, he reports he has had several jobs to Mining engineer, Lobbyist, Scientist, forensic, labs and maintenance work at [employer] (currently works part time).  He is originally from [place].  He began smoking at age 20 and quit in 1998.  He began smoking again but quit smoking cigarettes again and currently smokes cigars.  He was followed by Dr. Joya Gaskins in the late 90's for sarcoidosis (pt reports biopsy proven) and was released from pulmonary care in 2001.   He reports he has had PFT's in the remote past but they were "better than most men his age".    He reports after surgery he felt well but the second day the "bottom fell out".  He developed wheezing, cough with thick green sputum production.  He had one episode of small volume vomiting but did not look at the color.  He denies fevers but has had chills.  States he has felt short of breath since POD 2. CXR  evaluated shows old scarring, no new infiltrate (2/17).  Reports normal BM / PO intake currently.     SUBJECTIVE:  Pt reports feeling better today.  Hopeful to go home.  Reports less sputum production.  No fevers / chills.  Ambulated in hall without difficulty.    VITAL SIGNS: Temp:  [98.4 F (36.9 C)-99 F (37.2 C)] 98.5 F (36.9 C) (02/19 0519) Pulse Rate:  [64-83] 68 (02/19 0519) Resp:  [17-18] 18 (02/19 0519) BP: (109-125)/(50-53) 109/52 (02/19 0519) SpO2:  [93 %-100 %] 98 % (02/19 0935) Weight:  [221 lb 12.5 oz (100.6 kg)] 221 lb 12.5 oz (100.6 kg) (02/19 0519)  PHYSICAL EXAMINATION: General: elderly male in NAD sitting up in chair  HEENT: MM pink/moist, no jvd PSY: calm / appropriate  Neuro: AAOx4, speech clear, MAE  CV: s1s2 rrr, no m/r/g PULM: even/non-labored, soft rhonchi bilaterally  GI: soft, midline abd dressing c/d/i, bsx4 active  Extremities: warm/dry, no edema  Skin: no rashes or lesions    Recent Labs  Lab 07/26/17 1935 07/27/17 0552 07/28/17 0430  NA 135 137 137  K 4.0 4.4 4.2  CL 104 105 105  CO2 22 24 26   BUN 8 9 7   CREATININE 1.17 0.96 0.89  GLUCOSE 190* 121* 122*    Recent Labs  Lab 07/27/17 0552 07/28/17 0430 07/30/17 0633  HGB 10.0* 9.4* 9.2*  HCT  31.1* 30.3* 28.3*  WBC 5.9 4.1 7.2  PLT 123* 138* 147*    Dg Chest 2 View  Result Date: 07/29/2017 CLINICAL DATA:  Productive cough. Shortness of breath. Colectomy 3 days ago. EXAM: CHEST  2 VIEW COMPARISON:  Chest CT 06/22/2017 FINDINGS: Unchanged heart size and mediastinal contours. Calcified mediastinal hilar lymph nodes again seen. Surgical change in the right hemithorax with chain suture and surgical clips. Scattered right lung scarring appears similar to prior. Scattered calcified granulomas. No pulmonary edema. No pleural effusion. No pneumothorax. No acute osseous abnormalities are seen. IMPRESSION: Unchanged appearance of the chest with right lung scarring and postsurgical change.  Sequela of prior granulomatous disease. No acute abnormalities. Electronically Signed   By: Jeb Levering M.D.   On: 07/29/2017 22:48    SIGNIFICANT EVENTS  2/14  Admit for planned laparoscopic right colectomy 2/17  Pt developed increased SOB, cough with green sputum production  2/18  PCCM consulted    STUDIES CT Chest / ABD / Pelvis 1/11 >> large mass like appearance in the cecum, extensive findings of old granulomatous disease with calcified mediastinal & hilar / perihilar lymph nodes, no evidence fo distant metastatic disease  CULTURES Sputum 2/18 >>   ANTIBIOTICS  Augmentin 2/18 >>    ASSESSMENT / PLAN:  Discussion:  77 y/o M admitted 2/14 for planned laparoscopic right colectomy in the setting of ulcerated cecal mass consistent with adenocarcinoma.  POD 3/4 developed increased SOB, wheezing & cough with productive green sputum.  CXR does not show acute infiltrate.  Suspect this is acute bronchitis vs AECOPD.   Productive Cough / Acute Bronchitis - CXR negative  Dyspnea  Suspected COPD  Sarcoidosis  Granulomatous Lung Disease - suspect secondary to sarcoidosis, prior occupational exposures Tobacco Abuse  Plan: Augmentin PO x5 days  Mucinex BID Fluticasone inhaler for discharge  Delsym QHS PRN for cough   Smoking cessation counseling completed > pt does not seem ready to quit  Follow up in office with PFT's   Ulcerated Cecal Mass s/p R Colectomy   Plan: Rec's per primary     Pt is cleared for discharge from pulmonary standpoint.  Follow up as arranged.   Noe Gens, NP-C [facility] Pgr: [phone] or if no answer [phone] 07/31/2017, 12:00 PM

## 2017-08-01 ENCOUNTER — Telehealth: Payer: Self-pay | Admitting: Pulmonary Disease

## 2017-08-01 LAB — CULTURE, RESPIRATORY: CULTURE: NORMAL

## 2017-08-01 LAB — CULTURE, RESPIRATORY W GRAM STAIN

## 2017-08-02 NOTE — Telephone Encounter (Signed)
Will close encounter

## 2017-08-05 ENCOUNTER — Telehealth: Payer: Self-pay | Admitting: Pulmonary Disease

## 2017-08-05 NOTE — Telephone Encounter (Signed)
Given 5ds of augmentin on dc + flovent Still congested , sputum ahs lightened up  Offered him prednisone, but he would like [place] to check with dr Redmond Pulling, colon surgery on 2/15 He will call back tomorrow if persists

## 2017-08-06 NOTE — Telephone Encounter (Signed)
He (the patient) needs to call the surgeon to see if he can take prednisone. I would have him take 20mg  daily x 7 days

## 2017-08-06 NOTE — Telephone Encounter (Signed)
Patient is calling to see if we have checked with GI physician about patient taking prednisone.  Patient states symptoms are still persisting.  CB is [phone].

## 2017-08-06 NOTE — Telephone Encounter (Signed)
Spoke with patient. He was upset because he was under the impression that RA or BQ would call the surgeon for him. He was concerned that Dr. Redmond Pulling may have medical questions that he can't answer.   He stated that he would contact Dr. Dois Davenport office and have him contact BQ if he had any questions.   Will close this message.

## 2017-08-07 ENCOUNTER — Telehealth: Payer: Self-pay | Admitting: Pulmonary Disease

## 2017-08-07 MED ORDER — PREDNISONE 10 MG PO TABS: ORAL_TABLET | ORAL | 0 refills | Status: DC

## 2017-08-07 NOTE — Discharge Summary (Signed)
Physician Discharge Summary  Luis [name] ZJI:[id number] DOB: 05-May-1941 DOA: 07/26/2017  PCP: Isaias Sakai, DO  Admit date: 07/26/2017 Discharge date: 07/31/2017  Recommendations for Outpatient Follow-up:    Follow-up Information    Juanito Doom, MD Follow up on 08/28/2017.   Specialty:  Pulmonary Disease Why:  Appt at 11:15 am   Contact information: [place] [address] [phone]        Surgery, [place]. Go on 08/06/2017.   Specialty:  General Surgery Why:  NURSE APPOINTMENT for STAPLE REMOVAL at 3:30 PM Contact information: [address] [place] [address] [phone]        Greer Pickerel, MD. Go on 08/14/2017.   Specialty:  General Surgery Why:  at 3:15 PM for postop check Contact information: [place]  [place] [address] [phone]          Discharge Diagnoses:  1. Cecal cancer 2. Sarcoidosis with acute bronchitis 3. HTN 4. Obesity 5. Iron deficiency anemia 6. CAD  Surgical Procedure: Laparoscopic assisted right colectomy  Discharge Condition: Good Disposition: Home  Diet recommendation: Soft  Filed Weights   07/29/17 0625 07/30/17 0519 07/31/17 0519  Weight: 99.3 kg (218 lb 14.7 oz) 96 kg (211 lb 11.2 oz) 100.6 kg (221 lb 12.5 oz)    History of present illness: The patient is a 77 year old male who presents with colorectal cancer. He is referred by Dr Carlean Purl for evaluation of colon cancer. I removed his gallbladder a few years ago. He states that he noticed some blood in his stool. He was also found to have a drop in his blood count. Initially it was felt maybe to be due to internal hemorrhoids. However he was due for a colonoscopy later this year but it was moved up because of the drop in blood count. On colonoscopy he was found to have an ulcerated mass in the cecum. Biopsy proven adenocarcinoma. His colonoscopy was January 9. He underwent CT chest abdomen and pelvis  on January 11. There is no evidence of metastatic disease. He had some chronic lung scarring and a stable hepatic cyst. His CEA level was 1.3. He has been getting some intermittent iron transfusions. He has no more melena or hematochezia. He denies any abdominal pain. He denies any nausea or vomiting. He reports good appetite. He denies any chest pain, chest pressure, shortness of breath, angina. He does not smoke.     Hospital Course:  The patient was admitted on February 14 for planned laparoscopic assisted right colectomy.  ERAS protocol was utilized.  He was continued on perioperative chemical DVT prophylaxis.  He was started on clears the evening of surgery.  His diet was gradually advanced as bowel function recovered.  However his length of stay was increased due to active flare of his bronchitis.  He started having productive sputum.  Pulmonary was consulted.  He was started on oral antibiotic.  He was felt safe for discharge.  On day of discharge she was tolerating diet.  His vital signs are stable.  His pain was controlled with oral medication.  He was ambulating without difficulty.  Pulmonary had cleared him for discharge  BP (!) 109/52 (BP Location: Right Arm)   Pulse 68   Temp 98.5 F (36.9 C) (Oral)   Resp 18   Ht 5\' 11"  (1.803 m)   Wt 100.6 kg (221 lb 12.5 oz)   SpO2 98%   BMI 30.93 kg/m  Gen: alert, NAD, non-toxic appearing Pupils: equal, no scleral icterus Pulm: Lungs clear to auscultation, symmetric chest rise CV: regular rate and rhythm Abd: soft, approp mild tender, nondistended. No cellulitis. No incisional hernia Ext: no edema, no calf tenderness Skin: no rash, no jaundice    Discharge Instructions  Discharge Instructions    Call MD for:   Complete by:  As directed    Temperature >101   Call MD for:  hives   Complete by:  As directed    Call MD for:  persistant dizziness or light-headedness   Complete by:  As directed    Call MD for:   persistant nausea and vomiting   Complete by:  As directed    Call MD for:  redness, tenderness, or signs of infection (pain, swelling, redness, odor or green/yellow discharge around incision site)   Complete by:  As directed    Call MD for:  severe uncontrolled pain   Complete by:  As directed    Diet - low sodium heart healthy   Complete by:  As directed    Discharge instructions   Complete by:  As directed    See [facility] discharge instructions   Increase activity slowly   Complete by:  As directed      Allergies as of 07/31/2017   No Known Allergies     Medication List    STOP taking these medications   metroNIDAZOLE 500 MG tablet Commonly known as:  FLAGYL   neomycin 500 MG tablet Commonly known as:  MYCIFRADIN     TAKE these medications   amoxicillin-clavulanate 875-125 MG tablet Commonly known as:  AUGMENTIN Take 1 tablet by mouth 2 (two) times daily.   ASPIRIN 81 PO Take 81 mg by mouth daily.   clopidogrel 75 MG tablet Commonly known as:  PLAVIX Take 1 tablet (75 mg total) by mouth daily with breakfast.   clotrimazole 1 % cream Commonly known as:  LOTRIMIN Apply 1 application topically 2 (two) times daily as needed (for rash).   dextromethorphan 30 MG/5ML liquid Commonly known as:  DELSYM Take 5 mLs (30 mg total) by mouth at bedtime as needed for cough.   FISH OIL PO Take 1,400 mg by mouth daily.   fluocinonide ointment 0.05 % Commonly known as:  LIDEX Apply 1 application topically daily as needed (for rash).   fluticasone 110 MCG/ACT inhaler Commonly known as:  FLOVENT HFA Inhale 2 puffs into the lungs 2 (two) times daily.   glucosamine-chondroitin 500-400 MG tablet Take 1 tablet by mouth daily.   guaiFENesin 600 MG 12 hr tablet Commonly known as:  MUCINEX Take 1 tablet (600 mg total) by mouth 2 (two) times daily as needed for to loosen phlegm.   metoprolol tartrate 25 MG tablet Commonly known as:  LOPRESSOR Take 0.5 tablets (12.5 mg total) by  mouth 2 (two) times daily.   multivitamin with minerals Tabs tablet Take 1 tablet by mouth every evening.   naproxen sodium 220 MG tablet Commonly known as:  ALEVE Take 440-660 mg by mouth daily as needed (for pain).   nitroGLYCERIN 0.4 MG SL tablet Commonly known as:  NITROSTAT Place 0.4 mg under the tongue every 5 (five) minutes as needed for chest pain. X 3 doses   OVER THE COUNTER MEDICATION Take 10 mg by mouth 2 (two) times daily. CBD 10 mg Supplement   RAPAFLO 8 MG Caps capsule Generic drug:  silodosin Take 8 mg by mouth daily as needed (for urination).  rosuvastatin 20 MG tablet Commonly known as:  CRESTOR Take 1 tablet (20 mg total) by mouth daily.   sildenafil 20 MG tablet Commonly known as:  REVATIO Take 60 mg by mouth as needed (for ED).   traMADol 50 MG tablet Commonly known as:  ULTRAM Take 50-100 mg by mouth 4 (four) times daily as needed for moderate pain.   Vitamin D3 2000 units capsule Take 2,000 Units by mouth daily.      Follow-up Information    Juanito Doom, MD Follow up on 08/28/2017.   Specialty:  Pulmonary Disease Why:  Appt at 11:15 am   Contact information: [place] [address] [phone]        Surgery, [place]. Go on 08/06/2017.   Specialty:  General Surgery Why:  NURSE APPOINTMENT for STAPLE REMOVAL at 3:30 PM Contact information: [address] [place] [address] [phone]        Greer Pickerel, MD. Go on 08/14/2017.   Specialty:  General Surgery Why:  at 3:15 PM for postop check Contact information: [place] [address] [phone]            The results of significant diagnostics from this hospitalization (including imaging, microbiology, ancillary and laboratory) are listed below for reference.    Significant Diagnostic Studies: Dg Chest 2 View  Result Date: 07/29/2017 CLINICAL DATA:  Productive cough. Shortness of breath. Colectomy 3 days ago.  EXAM: CHEST  2 VIEW COMPARISON:  Chest CT 06/22/2017 FINDINGS: Unchanged heart size and mediastinal contours. Calcified mediastinal hilar lymph nodes again seen. Surgical change in the right hemithorax with chain suture and surgical clips. Scattered right lung scarring appears similar to prior. Scattered calcified granulomas. No pulmonary edema. No pleural effusion. No pneumothorax. No acute osseous abnormalities are seen. IMPRESSION: Unchanged appearance of the chest with right lung scarring and postsurgical change. Sequela of prior granulomatous disease. No acute abnormalities. Electronically Signed   By: Jeb Levering M.D.   On: 07/29/2017 22:48    Microbiology: Recent Results (from the past 240 hour(s))  Culture, expectorated sputum-assessment     Status: None   Collection Time: 07/30/17 10:56 AM  Result Value Ref Range Status   Specimen Description SPUTUM  Final   Sputum evaluation   Final    THIS SPECIMEN IS ACCEPTABLE FOR SPUTUM CULTURE Performed at [facility], [place] [address] [place], [place] [address]    Report Status 07/30/2017 FINAL  Final  Culture, respiratory (NON-Expectorated)     Status: None   Collection Time: 07/30/17 10:56 AM  Result Value Ref Range Status   Specimen Description   Final    SPUTUM Performed at [facility] [address], [place], [place] [address]    Gram Stain   Final    RARE WBC PRESENT, PREDOMINANTLY PMN FEW GRAM NEGATIVE RODS RARE GRAM POSITIVE COCCI IN PAIRS IN CLUSTERS    Culture   Final    Consistent with normal respiratory flora. Performed at [facility], [place] [address] [place], [place] [address]    Report Status 08/01/2017 FINAL  Final     Labs: BMP Latest Ref Rng & Units 07/28/2017 07/27/2017 07/26/2017  Glucose 65 - 99 mg/dL 122(H) 121(H) 190(H)  BUN 6 - 20 mg/dL 7 9 8   Creatinine 0.61 - 1.24 mg/dL 0.89 0.96 1.17  Sodium 135 - 145 mmol/L 137 137 135  Potassium 3.5 - 5.1 mmol/L 4.2 4.4  4.0  Chloride 101 - 111 mmol/L 105 105 104  CO2 22 - 32 mmol/L 26 24 22   Calcium 8.9 - 10.3 mg/dL 8.2(L) 8.0(L) 8.1(L)   CBC Latest Ref Rng & Units 07/30/2017 07/28/2017 07/27/2017  WBC 4.0 - 10.5 K/uL 7.2 4.1 5.9  Hemoglobin 13.0 - 17.0 g/dL 9.2(L) 9.4(L) 10.0(L)  Hematocrit 39.0 - 52.0 % 28.3(L) 30.3(L) 31.1(L)  Platelets 150 - 400 K/uL 147(L) 138(L) 123(L)     Active Problems:   Cecal cancer ([place])   Time coordinating discharge: 15 min  Signed:  Gayland Curry, MD [facility] Surgery, [place] [phone] 08/07/2017, 9:20 AM

## 2017-08-07 NOTE — Telephone Encounter (Signed)
Spoke with patient. He is aware of the prednisone. Will go ahead and call this into [facility] in [place].   Nothing else needed at time of call.

## 2017-08-07 NOTE — Telephone Encounter (Signed)
Thanks OK for him to have prednisone 20mg  daily x 7 days

## 2017-08-07 NOTE — Telephone Encounter (Signed)
pred Rx was already sent to pt's pharmacy by Cherina.  Called pt to let him know we already sent script to his pharmacy for him.  Pt expressed understanding. Nothing further needed at this current time.

## 2017-08-07 NOTE — Telephone Encounter (Signed)
Dr. Redmond Pulling got back to me that it is okay to give him prednisone

## 2017-08-16 ENCOUNTER — Other Ambulatory Visit: Payer: Self-pay | Admitting: Interventional Cardiology

## 2017-08-21 ENCOUNTER — Encounter: Payer: Self-pay | Admitting: Oncology

## 2017-08-21 ENCOUNTER — Telehealth: Payer: Self-pay | Admitting: Oncology

## 2017-08-21 NOTE — Telephone Encounter (Signed)
Appt has been scheduled for the pt to see Dr. Benay Spice on 3/18 at 2pm. Pt aware to arrive 30 minutes early. Letter mailed.

## 2017-08-27 ENCOUNTER — Inpatient Hospital Stay: Payer: Medicare Other | Attending: Oncology | Admitting: Oncology

## 2017-08-27 ENCOUNTER — Telehealth: Payer: Self-pay | Admitting: Oncology

## 2017-08-27 ENCOUNTER — Inpatient Hospital Stay: Payer: Medicare Other

## 2017-08-27 ENCOUNTER — Telehealth: Payer: Self-pay | Admitting: *Deleted

## 2017-08-27 VITALS — BP 138/45 | HR 63 | Temp 98.0°F | Resp 18 | Ht 71.0 in | Wt 218.7 lb

## 2017-08-27 DIAGNOSIS — Z79899 Other long term (current) drug therapy: Secondary | ICD-10-CM | POA: Insufficient documentation

## 2017-08-27 DIAGNOSIS — I2510 Atherosclerotic heart disease of native coronary artery without angina pectoris: Secondary | ICD-10-CM | POA: Insufficient documentation

## 2017-08-27 DIAGNOSIS — Z85828 Personal history of other malignant neoplasm of skin: Secondary | ICD-10-CM | POA: Diagnosis not present

## 2017-08-27 DIAGNOSIS — Z8546 Personal history of malignant neoplasm of prostate: Secondary | ICD-10-CM | POA: Diagnosis not present

## 2017-08-27 DIAGNOSIS — Z86010 Personal history of colonic polyps: Secondary | ICD-10-CM | POA: Diagnosis not present

## 2017-08-27 DIAGNOSIS — D500 Iron deficiency anemia secondary to blood loss (chronic): Secondary | ICD-10-CM

## 2017-08-27 DIAGNOSIS — Z955 Presence of coronary angioplasty implant and graft: Secondary | ICD-10-CM | POA: Insufficient documentation

## 2017-08-27 DIAGNOSIS — C180 Malignant neoplasm of cecum: Secondary | ICD-10-CM | POA: Insufficient documentation

## 2017-08-27 DIAGNOSIS — D869 Sarcoidosis, unspecified: Secondary | ICD-10-CM | POA: Diagnosis not present

## 2017-08-27 DIAGNOSIS — Z87891 Personal history of nicotine dependence: Secondary | ICD-10-CM | POA: Diagnosis not present

## 2017-08-27 DIAGNOSIS — I10 Essential (primary) hypertension: Secondary | ICD-10-CM | POA: Diagnosis not present

## 2017-08-27 DIAGNOSIS — E785 Hyperlipidemia, unspecified: Secondary | ICD-10-CM | POA: Insufficient documentation

## 2017-08-27 DIAGNOSIS — Z9841 Cataract extraction status, right eye: Secondary | ICD-10-CM | POA: Insufficient documentation

## 2017-08-27 DIAGNOSIS — D509 Iron deficiency anemia, unspecified: Secondary | ICD-10-CM | POA: Insufficient documentation

## 2017-08-27 LAB — CBC WITH DIFFERENTIAL (CANCER CENTER ONLY)
BASOS PCT: 0 %
Basophils Absolute: 0 10*3/uL (ref 0.0–0.1)
EOS ABS: 0.2 10*3/uL (ref 0.0–0.5)
Eosinophils Relative: 4 %
HEMATOCRIT: 32.9 % — AB (ref 38.4–49.9)
HEMOGLOBIN: 10.4 g/dL — AB (ref 13.0–17.1)
LYMPHS ABS: 0.8 10*3/uL — AB (ref 0.9–3.3)
Lymphocytes Relative: 13 %
MCH: 26.1 pg — AB (ref 27.2–33.4)
MCHC: 31.6 g/dL — ABNORMAL LOW (ref 32.0–36.0)
MCV: 82.7 fL (ref 79.3–98.0)
Monocytes Absolute: 0.5 10*3/uL (ref 0.1–0.9)
Monocytes Relative: 8 %
NEUTROS ABS: 4.6 10*3/uL (ref 1.5–6.5)
NEUTROS PCT: 75 %
Platelet Count: 193 10*3/uL (ref 140–400)
RBC: 3.98 MIL/uL — AB (ref 4.20–5.82)
RDW: 20.8 % — ABNORMAL HIGH (ref 11.0–14.6)
WBC: 6.1 10*3/uL (ref 4.0–10.3)

## 2017-08-27 NOTE — Progress Notes (Signed)
Luis [name]   Referring MD: Machael Raine 77 y.o.  [birth year]    Reason for Referral: Colon cancer   HPI: Luis [name] reports having a few episodes of rectal bleeding.  He was referred to Dr. Arelia Longest for a colonoscopy 06/20/2017.  An ulcerated mass was found in the cecum.  No bleeding was present.  The mass was biopsied.  Multiple diverticula were found in the entire colon.  A flat firm prostate bed was noted on digital rectal exam.  The biopsy from the cecum mass revealed invasive adenocarcinoma.  CTs of the chest, abdomen, and pelvis on 06/22/2017 revealed scattered calcified mediastinal hilar nodes.  Granulomatous disease was noted in the lungs bilaterally.  No worrisome new nodules.  A hypodense lesion in segment 2 of the liver is stable from 2015.  Soft tissue prominence was noted at the cecum no pathologic adenopathy.  Seed implants in the prostate gland.  He was found to have anemia.  He was referred to Dr. Redmond Pulling.  He was taken to the operating room on 07/26/2017 for a laparoscopic-assisted right colectomy and excision of a scrotal cyst. The pathology ([id number]) confirmed an invasive adenocarcinoma of the cecum.  Tumor invaded into pericecal connective tissue and involved the mucosa of the adjacent terminal ileum.  The resection margins were negative.  16 lymph nodes were negative for metastatic carcinoma.  No lymphovascular invasion.  Perineural invasion is present.  No macroscopic tumor perforation.  No tumor deposits.  No additional polyps.  The tumor returned microsatellite stable with no loss of mismatch repair protein expression.  He reports developing a flare of sarcoidosis following surgery.  His respiratory status has improved.  Past Medical History:  Diagnosis Date  . Adenocarcinoma of cecum (HCC)-stage II, T3N0  07/26/2017  . Arthritis    "mid back; hands; knees" (02/24/2015)  . Basal cell carcinoma    left shoulder; mid  chest; right eyelid (02/24/2015)  . CAD (coronary artery disease)    a. 08/2014 Cath/PCI: LM nl, LAD 30p, D1 95 (2.25x12 Resolute Integrity DES), LCX small, RI 100 (attempted PCI) - branches fill via L->L collats, RCA dominant, nl, RPDA/PLA nl, EF 60^. b. 02/24/2015 PCI CTO of Ramus DES x2.  . Chronic bronchitis (HCC)    hx  . Diverticulosis 05/2005  . Elevated lipase   . Fuchs' corneal dystrophy   . History of adenomatous polyp of colon 05/2005   8 mm adenoma  . History of blood transfusion    "related to some of my surgeries"  . Hyperlipidemia   . Hypertension   . Iron deficiency anemia due to chronic blood loss 06/21/2017  . Prostate cancer ([place]) 2011   S/P seed implant  . Sarcoidosis   . Thrombocytopenia ([place])    a. Noted on prior labs, unclear of what w/u done.  . Trifascicular block     Past Surgical History:  Procedure Laterality Date  . BASAL CELL CARCINOMA EXCISION Left    shoulder  . CARDIAC CATHETERIZATION N/A 02/24/2015   Procedure: Coronary/Bypass Graft CTO Intervention;  Surgeon: Jettie Booze, MD;  Location: [facility];  Service: Cardiovascular;  Laterality: N/A;  . CARDIAC CATHETERIZATION  02/24/2015   Procedure: Coronary/Graft Atherectomy;  Surgeon: Jettie Booze, MD;  Location: [facility];  Service: Cardiovascular;;  . CATARACT EXTRACTION W/ INTRAOCULAR LENS  IMPLANT, BILATERAL Bilateral ~ 2011-2012  . COLONOSCOPY W/ POLYPECTOMY  06/08/2005 and 10/18/2010   8 mm  adenoma 2006, none 2012. Diverticulosis and internal hemorrhoids.  . CORNEAL TRANSPLANT Bilateral ~ 2011-2012   "@ same time as cataract OR"  . CORONARY ANGIOPLASTY WITH STENT PLACEMENT  08/2014; 02/24/2015   "1 stent + 1 stent"  . EYE SURGERY    . FINGER SURGERY Right 2014   "reattached middle finger"  . INSERTION PROSTATE RADIATION SEED  ~ 2011  . LAPAROSCOPIC CHOLECYSTECTOMY  2015  . LAPAROSCOPIC PARTIAL COLECTOMY N/A 07/26/2017   Procedure: LAPAROSCOPIC PARTIAL COLECTOMY,  EXCISION SCROTAL CYST;  Surgeon: Greer Pickerel, MD;  Location: [facility];  Service: General;  Laterality: N/A;  . LEFT HEART CATHETERIZATION WITH CORONARY ANGIOGRAM N/A 08/12/2014   Procedure: LEFT HEART CATHETERIZATION WITH CORONARY ANGIOGRAM;  Surgeon: Blane Ohara, MD;  Location: [facility];  Service: Cardiovascular;  Laterality: N/A;  . LYMPH NODE BIOPSY     mid chest  . MOHS SURGERY Right ~ 2011   "eyelid; for basal cell"  . THOROCOTOMY WITH LOBECTOMY Right ~ 1991   partial removal right lung  . TONSILLECTOMY  ~ 1950    Medications: Reviewed  Allergies: No Known Allergies  Family history: No family history of cancer  Social History:   He lives wife in [place].  He is a Adult nurse.  He quit smoking cigarettes 10-12 years ago.  He reports occasional alcohol use.  He thinks he may have received a transfusion with lung surgery in the remote past.  No risk factor for HIV or hepatitis.  ROS:   Positives include: Sarcoidosis flare following colon surgery, rectal bleeding for a few days prior to colon surgery  A complete ROS was otherwise negative.  Physical Exam:  Blood pressure (!) 138/45, pulse 63, temperature 98 F (36.7 C), temperature source Oral, resp. rate 18, height '5\' 11"'$  (1.803 m), weight 218 lb 11.2 oz (99.2 kg), SpO2 99 %.  HEENT: Upper denture plate, oropharynx without visible mass, neck without mass Lungs: Clear bilaterally, no respiratory distress Cardiac: Regular rate and rhythm Abdomen: No hepatosplenomegaly, no mass, nontender, healed surgical incision GU: Testes without mass Vascular: No leg edema Lymph nodes: No cervical, supraclavicular, axillary, or inguinal nodes Neurologic: Alert and oriented, the motor exam appears intact in the upper and lower extremities Skin: No rash Musculoskeletal: No spine tenderness   LAB:  CBC  Hemoglobin 10.4, MCV 82.7, platelets 193,000     CMP     Component Value Date/Time   NA 137 07/28/2017 0430   K  4.2 07/28/2017 0430   CL 105 07/28/2017 0430   CO2 26 07/28/2017 0430   GLUCOSE 122 (H) 07/28/2017 0430   BUN 7 07/28/2017 0430   CREATININE 0.89 07/28/2017 0430   CREATININE 1.02 05/16/2016 0937   CALCIUM 8.2 (L) 07/28/2017 0430   PROT 6.1 (L) 07/23/2017 1150   ALBUMIN 3.5 07/23/2017 1150   AST 30 07/23/2017 1150   ALT 23 07/23/2017 1150   ALKPHOS 67 07/23/2017 1150   BILITOT 0.6 07/23/2017 1150   GFRNONAA >60 07/28/2017 0430   GFRAA >60 07/28/2017 0430     9 2019-1 0.3  Imaging:  As per HPI, CT images from 06/22/2017-reviewed   Assessment/Plan:   1. Adenocarcinoma of the cecum, stage IIA (T3N0), status post a right colectomy 07/26/2017  0/16 lymph nodes positive, no lymphovascular invasion, perineural invasion present  MSI-stable, no loss of mismatch repair protein expression 2. History of colon polyps 3. Microcytic anemia January 2019 4. Sarcoidosis 5. Coronary artery disease    Disposition:   Luis [name] has been diagnosed with adenocarcinoma of the cecum.  I discussed the prognosis and details of the surgical pathology report with him.  He has an early stage II cancer.  I discussed the lack of clear benefit from adjuvant systemic therapy in the majority of patients with resected stage II colon cancer.  The only high risk feature in his tumor is the presence of perineural invasion.  There is a small potential absolute benefit with adjuvant capecitabine chemotherapy in his case.  He has a good prognosis for a long-term disease-free survival.  I do not recommend adjuvant chemotherapy.  He is comfortable with this decision.  I contacted Dr. Saralyn Pilar and confirmed the tumor was a T3 lesion.  Luis [name] understands his family members are at increased risk of developing colorectal cancer despite the negative screening for HNPCC.  He will be sure they receive appropriate screening.  We discussed diet and exercise maneuvers that may decrease the risk of developing colon  cancer.  Luis [name] will return for an office visit and CEA in 6 months.  He will continue colonoscopy surveillance with Dr. Carlean Purl.  50 minutes were spent with the patient today.  The majority of the time was used for counseling and coordination of care.  Betsy Coder, MD  08/27/2017, 6:17 PM

## 2017-08-27 NOTE — Telephone Encounter (Signed)
-----   Message from Ladell Pier, MD sent at 08/27/2017  5:14 PM EDT ----- Please call patient, the hemoglobin is better, follow-up as scheduled

## 2017-08-27 NOTE — Telephone Encounter (Signed)
Notified pt of HGB result, per MD note below. He voiced understanding.

## 2017-08-27 NOTE — Progress Notes (Signed)
  Oncology Nurse Navigator Documentation  Navigator Location: [facility] (08/27/17 1434) Referral date to RadOnc/MedOnc: 08/20/17 (08/27/17 1434) )Navigator Encounter Type: Initial MedOnc (08/27/17 1434)   Abnormal Finding Date: 06/20/17 (08/27/17 1434) Confirmed Diagnosis Date: 06/21/17 (08/27/17 1434) Surgery Date: 07/26/17 (08/27/17 1434)           Treatment Initiated Date: 07/26/17 (08/27/17 1434) Patient Visit Type: Initial;MedOnc (08/27/17 1434)   Barriers/Navigation Needs: No barriers at this time (08/27/17 1434) Met with patient and wife at initial med/onc appointment. I introduced my role as GI navigator and provided my contact information for future questions or concerns. No barriers identified.                Acuity: Level 1 (08/27/17 1434)         Time Spent with Patient: 15 (08/27/17 1434)

## 2017-08-27 NOTE — Telephone Encounter (Signed)
Appointments scheduled AVS/Calendar printed per 3/18 los °

## 2017-08-28 ENCOUNTER — Ambulatory Visit (INDEPENDENT_AMBULATORY_CARE_PROVIDER_SITE_OTHER)
Admission: RE | Admit: 2017-08-28 | Discharge: 2017-08-28 | Disposition: A | Discharge status: Home / Self Care | Payer: Medicare Other | Source: Ambulatory Visit | Attending: Pulmonary Disease | Admitting: Pulmonary Disease

## 2017-08-28 ENCOUNTER — Encounter: Payer: Self-pay | Admitting: Pulmonary Disease

## 2017-08-28 ENCOUNTER — Ambulatory Visit: Payer: Medicare Other | Admitting: Pulmonary Disease

## 2017-08-28 ENCOUNTER — Telehealth: Payer: Self-pay | Admitting: Pulmonary Disease

## 2017-08-28 VITALS — BP 134/76 | HR 61 | Ht 71.0 in | Wt 217.0 lb

## 2017-08-28 DIAGNOSIS — D869 Sarcoidosis, unspecified: Secondary | ICD-10-CM

## 2017-08-28 NOTE — Telephone Encounter (Signed)
Now please

## 2017-08-28 NOTE — Progress Notes (Signed)
Subjective:   PATIENT ID: Luis [name] GENDER: male DOB: [birth year], MRN: [id number]  Synopsis: This is a former patient of Dr. Joya Gaskins who has biopsy-proven sarcoidosis.  He was treated off and on with prednisone for about 2-3 years after his diagnosis around 2008.  Since 2011 he did only needed periodic prednisone for episodes of acute bronchitis.  He was not seen by the pulmonary clinic for several years until 2019 when he was admitted for an elective right partial colectomy and then developed some hypoxemia and mucus production afterwards.  HPI  Chief Complaint  Patient presents with  . Hospitalization Follow-up    pt seen in hospital X1 month ago d/t dyspnea.  pt states he is doing well currently.     Luis [name] is doing well.  His breathing has improved and he has recovered from his bronchitis and surgery.  He is still taking Flovent.  He says his surgical healing is well.    He has not had any trouble breathing or cough.  He wants to start playing raquetball again.    Past Medical History:  Diagnosis Date  . Adenocarcinoma of cecum ([place]) 06/21/2017  . Arthritis    "mid back; hands; knees" (02/24/2015)  . Basal cell carcinoma    left shoulder; mid chest; right eyelid (02/24/2015)  . CAD (coronary artery disease)    a. 08/2014 Cath/PCI: LM nl, LAD 30p, D1 95 (2.25x12 Resolute Integrity DES), LCX small, RI 100 (attempted PCI) - branches fill via L->L collats, RCA dominant, nl, RPDA/PLA nl, EF 60^. b. 02/24/2015 PCI CTO of Ramus DES x2.  . Chronic bronchitis (HCC)    hx  . Diverticulosis 05/2005  . Elevated lipase   . Fuchs' corneal dystrophy   . History of adenomatous polyp of colon 05/2005   8 mm adenoma  . History of blood transfusion    "related to some of my surgeries"  . Hyperlipidemia   . Hypertension   . Iron deficiency anemia due to chronic blood loss 06/21/2017  . Prostate cancer ([place]) 2011   S/P seed implant  . Sarcoidosis   . Thrombocytopenia ([place])    a. Noted  on prior labs, unclear of what w/u done.  . Trifascicular block      Family History  Problem Relation Age of Onset  . Heart disease Father 40       Died from "hardening of the arteries" age 23  . Coronary artery disease Sister 85  . Heart attack Sister   . Colon cancer Neg Hx   . Throat cancer Neg Hx   . Pancreatic cancer Neg Hx   . Prostate cancer Neg Hx   . Stroke Neg Hx   . Hypertension Neg Hx      Social History   Socioeconomic History  . Marital status: Married    Spouse name: Not on file  . Number of children: 2  . Years of education: Not on file  . Highest education level: Not on file  Social Needs  . Financial resource strain: Not on file  . Food insecurity - worry: Not on file  . Food insecurity - inability: Not on file  . Transportation needs - medical: Not on file  . Transportation needs - non-medical: Not on file  Occupational History  . Occupation: Unemployed  Tobacco Use  . Smoking status: Former Smoker    Packs/day: 0.00    Years: 59.00    Pack years: 0.00    Types: Cigars  .  Smokeless tobacco: Never Used  . Tobacco comment: 02/24/2015 "quit cigarettes ~ 2000 ago but smokes cigars ionce/month"  Substance and Sexual Activity  . Alcohol use: Yes    Alcohol/week: 1.2 oz    Types: 1 Glasses of wine, 1 Standard drinks or equivalent per week    Comment: Drinks maybe 1-2 drinks a week or less  . Drug use: No  . Sexual activity: Not on file  Other Topics Concern  . Not on file  Social History Narrative   Lives at home wife.       No Known Allergies   Outpatient Medications Prior to Visit  Medication Sig Dispense Refill  . Cholecalciferol (VITAMIN D3) 2000 units capsule Take 2,000 Units by mouth daily.     . clopidogrel (PLAVIX) 75 MG tablet Take 1 tablet (75 mg total) by mouth daily with breakfast. 90 tablet 1  . clotrimazole (LOTRIMIN) 1 % cream Apply 1 application topically 2 (two) times daily as needed (for rash).    [name] glucosamine-chondroitin  500-400 MG tablet Take 1 tablet by mouth daily.    . metoprolol tartrate (LOPRESSOR) 25 MG tablet Take 0.5 tablets (12.5 mg total) by mouth 2 (two) times daily. 90 tablet 3  . Multiple Vitamin (MULTIVITAMIN WITH MINERALS) TABS tablet Take 1 tablet by mouth every evening.    . nitroGLYCERIN (NITROSTAT) 0.4 MG SL tablet Place 0.4 mg under the tongue every 5 (five) minutes as needed for chest pain. X 3 doses    . Omega-3 Fatty Acids (FISH OIL PO) Take 1,400 mg by mouth daily.     [name] OVER THE COUNTER MEDICATION Take 4 drops by mouth 2 (two) times daily. CBD oil    . rosuvastatin (CRESTOR) 20 MG tablet TAKE 1 TABLET BY MOUTH EVERY DAY 90 tablet 2  . sildenafil (REVATIO) 20 MG tablet Take 60 mg by mouth as needed (for ED).     [name] silodosin (RAPAFLO) 8 MG CAPS capsule Take 8 mg by mouth daily as needed (for urination).     . traMADol (ULTRAM) 50 MG tablet Take 50-100 mg by mouth 4 (four) times daily as needed for moderate pain.     . fluticasone (FLOVENT HFA) 110 MCG/ACT inhaler Inhale 2 puffs into the lungs 2 (two) times daily. 1 Inhaler 2  . ASPIRIN 81 PO Take 81 mg by mouth daily.    . fluocinonide ointment (LIDEX) 1.61 % Apply 1 application topically daily as needed (for rash).     . naproxen sodium (ALEVE) 220 MG tablet Take 440-660 mg by mouth daily as needed (for pain).     Facility-Administered Medications Prior to Visit  Medication Dose Route Frequency Provider Last Rate Last Dose  . 0.9 %  sodium chloride infusion  500 mL Intravenous Continuous Gatha Mayer, MD        Review of Systems  Constitutional: Negative for chills and malaise/fatigue.  HENT: Negative for ear discharge, nosebleeds and sinus pain.   Respiratory: Negative for cough, sputum production and wheezing.   Cardiovascular: Negative for palpitations, claudication and leg swelling.  Neurological: Negative for weakness.      Objective:  Physical Exam   Vitals:   08/28/17 1123  BP: 134/76  Pulse: 61  SpO2: 99%    Weight: 217 lb (98.4 kg)  Height: 5\' 11"  (1.803 m)    Gen: well appearing HENT: OP clear, TM's clear, neck supple PULM: Crackles left base B, normal percussion CV: RRR, no mgr, trace edema GI: BS+, soft,  nontender Derm: no cyanosis or rash Psyche: normal mood and affect   CBC    Component Value Date/Time   WBC 6.1 08/27/2017 1619   WBC 7.2 07/30/2017 0633   RBC 3.98 (L) 08/27/2017 1619   HGB 9.2 (L) 07/30/2017 0633   HCT 32.9 (L) 08/27/2017 1619   PLT 193 08/27/2017 1619   MCV 82.7 08/27/2017 1619   MCH 26.1 (L) 08/27/2017 1619   MCHC 31.6 (L) 08/27/2017 1619   RDW 20.8 (H) 08/27/2017 1619   LYMPHSABS 0.8 (L) 08/27/2017 1619   MONOABS 0.5 08/27/2017 1619   EOSABS 0.2 08/27/2017 1619   BASOSABS 0.0 08/27/2017 1619     Chest imaging: 07/2015 CXR Right lung scarring, prior rgranrulomatous disease  Other imaging: CT Chest / ABD / Pelvis 1/11 >> large mass like appearance in the cecum, extensive findings of old granulomatous disease with calcified mediastinal & hilar / perihilar lymph nodes, no evidence fo distant metastatic disease    PFT:  Labs:  Path:  Echo:  Heart Catheterization:  Records from his hospitalization for an elective hemicolectomy in February 2019 reviewed     Assessment & Plan:   Sarcoidosis - Plan: Pulmonary function test, DG Chest 2 View  Discussion: Luis [name] was hospitalized for an elective right hemicolectomy and during that time he had bronchitis and mild hypoxemia.  This is all resolved.  He has baseline sarcoidosis and had not been seen in a few years.  He has annual episodes of bronchitis.  I think the best approach at this point is to get a repeat lung function test and chest x-ray.  We will plan on seeing him on an annual basis.  Pulmonary sarcoidosis: Stop Flovent Lung function test Chest x-ray Ambulatory oximetry monitoring today  Follow-up in 1 year or sooner if needed  > 50% of this 28 minute visit spent face to  face    Current Outpatient Medications:  .  Cholecalciferol (VITAMIN D3) 2000 units capsule, Take 2,000 Units by mouth daily. , Disp: , Rfl:  .  clopidogrel (PLAVIX) 75 MG tablet, Take 1 tablet (75 mg total) by mouth daily with breakfast., Disp: 90 tablet, Rfl: 1 .  clotrimazole (LOTRIMIN) 1 % cream, Apply 1 application topically 2 (two) times daily as needed (for rash)., Disp: , Rfl:  .  glucosamine-chondroitin 500-400 MG tablet, Take 1 tablet by mouth daily., Disp: , Rfl:  .  metoprolol tartrate (LOPRESSOR) 25 MG tablet, Take 0.5 tablets (12.5 mg total) by mouth 2 (two) times daily., Disp: 90 tablet, Rfl: 3 .  Multiple Vitamin (MULTIVITAMIN WITH MINERALS) TABS tablet, Take 1 tablet by mouth every evening., Disp: , Rfl:  .  nitroGLYCERIN (NITROSTAT) 0.4 MG SL tablet, Place 0.4 mg under the tongue every 5 (five) minutes as needed for chest pain. X 3 doses, Disp: , Rfl:  .  Omega-3 Fatty Acids (FISH OIL PO), Take 1,400 mg by mouth daily. , Disp: , Rfl:  .  OVER THE COUNTER MEDICATION, Take 4 drops by mouth 2 (two) times daily. CBD oil, Disp: , Rfl:  .  rosuvastatin (CRESTOR) 20 MG tablet, TAKE 1 TABLET BY MOUTH EVERY DAY, Disp: 90 tablet, Rfl: 2 .  sildenafil (REVATIO) 20 MG tablet, Take 60 mg by mouth as needed (for ED). , Disp: , Rfl:  .  silodosin (RAPAFLO) 8 MG CAPS capsule, Take 8 mg by mouth daily as needed (for urination). , Disp: , Rfl:  .  traMADol (ULTRAM) 50 MG tablet, Take 50-100 mg by mouth  4 (four) times daily as needed for moderate pain. , Disp: , Rfl:   Current Facility-Administered Medications:  .  0.9 %  sodium chloride infusion, 500 mL, Intravenous, Continuous, Carlean Purl Ofilia Neas, MD

## 2017-08-28 NOTE — Telephone Encounter (Signed)
Patient is aware that PFT was to be scheduled now. Nothing further needed.

## 2017-08-28 NOTE — Patient Instructions (Signed)
For Sarcoidosis: Stop Flovent Check PFT Check Chest X-ray Check O2 while walking Follow up 1year of sooner if needed

## 2017-08-28 NOTE — Telephone Encounter (Signed)
BQ please advise if the PFT should be scheduled on now or at one year follow up?

## 2017-08-29 NOTE — Progress Notes (Signed)
Was able to talk to the patient regarding their results.  They verbalized an understanding of what was discussed. No further questions at this time. 

## 2017-08-30 ENCOUNTER — Ambulatory Visit (INDEPENDENT_AMBULATORY_CARE_PROVIDER_SITE_OTHER): Payer: Medicare Other | Admitting: Pulmonary Disease

## 2017-08-30 DIAGNOSIS — D869 Sarcoidosis, unspecified: Secondary | ICD-10-CM

## 2017-08-30 LAB — PULMONARY FUNCTION TEST
DL/VA % PRED: 98 %
DL/VA: 4.58 ml/min/mmHg/L
DLCO cor % pred: 61 %
DLCO cor: 20.68 ml/min/mmHg
DLCO unc % pred: 52 %
DLCO unc: 17.73 ml/min/mmHg
FEF 25-75 PRE: 1.35 L/s
FEF 25-75 Post: 1.72 L/sec
FEF2575-%CHANGE-POST: 27 %
FEF2575-%PRED-PRE: 59 %
FEF2575-%Pred-Post: 76 %
FEV1-%Change-Post: 5 %
FEV1-%PRED-PRE: 69 %
FEV1-%Pred-Post: 73 %
FEV1-PRE: 2.19 L
FEV1-Post: 2.31 L
FEV1FVC-%CHANGE-POST: 4 %
FEV1FVC-%Pred-Pre: 97 %
FEV6-%CHANGE-POST: 1 %
FEV6-%PRED-PRE: 76 %
FEV6-%Pred-Post: 77 %
FEV6-Post: 3.15 L
FEV6-Pre: 3.11 L
FEV6FVC-%Change-Post: 0 %
FEV6FVC-%PRED-POST: 105 %
FEV6FVC-%PRED-PRE: 105 %
FVC-%CHANGE-POST: 1 %
FVC-%PRED-POST: 73 %
FVC-%PRED-PRE: 72 %
FVC-POST: 3.18 L
FVC-Pre: 3.14 L
POST FEV6/FVC RATIO: 99 %
PRE FEV1/FVC RATIO: 70 %
Post FEV1/FVC ratio: 73 %
Pre FEV6/FVC Ratio: 99 %
RV % PRED: 71 %
RV: 1.89 L
TLC % PRED: 68 %
TLC: 4.99 L

## 2017-08-30 NOTE — Progress Notes (Signed)
PFT done today. 

## 2017-09-05 ENCOUNTER — Telehealth: Payer: Self-pay

## 2017-09-05 NOTE — Telephone Encounter (Signed)
-----   Message from Juanito Doom, MD sent at 09/05/2017  8:15 AM EDT ----- A, Please let him know that the PFT showed findings consistent with sarcoidosis.  He doesn't have any severe abnormalities, but I still think it is a good idea for him to keep annual visits with [place]. Thanks, Ruby Cola

## 2017-09-05 NOTE — Telephone Encounter (Signed)
Spoke with pt, aware of results/recs.  Nothing further needed.  

## 2017-10-21 ENCOUNTER — Other Ambulatory Visit: Payer: Self-pay | Admitting: Pulmonary Disease

## 2017-11-25 ENCOUNTER — Other Ambulatory Visit: Payer: Self-pay | Admitting: Cardiology

## 2017-12-19 ENCOUNTER — Other Ambulatory Visit: Payer: Self-pay | Admitting: Interventional Cardiology

## 2017-12-19 ENCOUNTER — Telehealth: Payer: Self-pay | Admitting: Interventional Cardiology

## 2017-12-19 MED ORDER — NITROGLYCERIN 0.4 MG SL SUBL: 0.4000 mg | SUBLINGUAL_TABLET | SUBLINGUAL | 4 refills | Status: DC | PRN

## 2017-12-19 NOTE — Telephone Encounter (Signed)
Discussed with Dr Rayann Heman and pt needs to be seen earlier per Dr Rayann Heman.Appt made with Almyra Deforest PA at [facility] on 12-26-17 at 3:00 pm Pt aware of recommendations if has increase in s/s will go to ED for eval ./cy

## 2017-12-19 NOTE — Telephone Encounter (Signed)
New Message    *STAT* If patient is at the pharmacy, call can be transferred to refill team.   1. Which medications need to be refilled? (please list name of each medication and dose if known) nitroGLYCERIN (NITROSTAT) 0.4 MG SL tablet  2. Which pharmacy/location (including street and city if local pharmacy) is medication to be sent to? [id number] - [place], [place]  3. Do they need a 30 day or 90 day supply?

## 2017-12-19 NOTE — Telephone Encounter (Signed)
New Message   Pt states he just wants the doctor aware when he comes to his appt on 7/25 that he has been walking 3 miles a day and recently is is becoming sob and having a little chest pain every 3-4 days  Pt c/o of Chest Pain: STAT if CP now or developed within 24 hours  1. Are you having CP right now? no  2. Are you experiencing any other symptoms (ex. SOB, nausea, vomiting, sweating)? sob  3. How long have you been experiencing CP?   4. Is your CP continuous or coming and going? coming and going  5. Have you taken Nitroglycerin? yes ?

## 2017-12-19 NOTE — Telephone Encounter (Signed)
Pt's medication was sent to pt's pharmacy as requested. Confirmation received.  °

## 2017-12-19 NOTE — Telephone Encounter (Signed)
Spoke with pt and and has noted this last week has had chest pain while walking and has been walking for 3-4 weeks , 4-5 times a week , 3 miles and notes SOB when going up hill. Pt has taken ntg 2 times with relief Pt has upcoming appt with Dr Irish Lack  Will forward to to Dr Irish Lack for review .Adonis Housekeeper

## 2017-12-25 NOTE — Telephone Encounter (Signed)
Would consider adding Imdur 30 mg daily, or increasing metoprolol of HR > 65

## 2017-12-26 ENCOUNTER — Encounter: Payer: Self-pay | Admitting: Physician Assistant

## 2017-12-26 ENCOUNTER — Ambulatory Visit: Payer: Medicare Other | Admitting: Physician Assistant

## 2017-12-26 VITALS — BP 128/54 | HR 62 | Ht 71.0 in | Wt 224.0 lb

## 2017-12-26 DIAGNOSIS — I10 Essential (primary) hypertension: Secondary | ICD-10-CM | POA: Diagnosis not present

## 2017-12-26 DIAGNOSIS — C189 Malignant neoplasm of colon, unspecified: Secondary | ICD-10-CM

## 2017-12-26 DIAGNOSIS — I25119 Atherosclerotic heart disease of native coronary artery with unspecified angina pectoris: Secondary | ICD-10-CM | POA: Diagnosis not present

## 2017-12-26 DIAGNOSIS — J8410 Pulmonary fibrosis, unspecified: Secondary | ICD-10-CM

## 2017-12-26 DIAGNOSIS — E785 Hyperlipidemia, unspecified: Secondary | ICD-10-CM

## 2017-12-26 DIAGNOSIS — R0609 Other forms of dyspnea: Secondary | ICD-10-CM | POA: Diagnosis not present

## 2017-12-26 DIAGNOSIS — R079 Chest pain, unspecified: Secondary | ICD-10-CM | POA: Diagnosis not present

## 2017-12-26 NOTE — Patient Instructions (Signed)
Medication Instructions: Your physician recommends that you continue on your current medications as directed. Please refer to the Current Medication list given to you today.  If you need a refill on your cardiac medications before your next appointment, please call your pharmacy.    Procedures/Testing: Your physician has requested that you have an exercise stress myoview. For further information please visit [email or web address]. Please follow instruction sheet, as given.  Hold: Metoprolol the morning of the test.   Follow-Up: Your physician wants you to follow-up in 3-4 months with Dr. Irish Lack.    Thank you for choosing [facility]!!

## 2017-12-26 NOTE — Progress Notes (Signed)
Cardiology Office Note    Date:  12/28/2017   ID:  [name], DOB 04-Nov-1940, MRN [id number]  PCP:  Isaias Sakai, DO  Cardiologist:  Dr. Irish Lack   Chief Complaint  Patient presents with  . Follow-up    pt c/o intermittent CP that happens at rest and goes away quickly--had an episode about 4 weeks ago that did not go away until taking nitro; sob that comes and goes. States he has been running and walking--has started getting tired more quickly.    History of Present Illness:  Luis [name] is a 77 y.o. male with PMH of CAD, hypertension, hyperlipidemia, prostate cancer s/p seed implantation, pulmonary fibrosis and history of sarcoidosis.  He has a history of DES to diagonal in March 2016, ramus intermedius was totally occluded at the time.  He eventually underwent CTO PCI with overlapping DES x2 to ramus in September 2016.  In December 2017, he had an episode of chest pain relieved with nitroglycerin.  Ranexa was started.  He was last seen by Dr. Irish Lack in January 2019, at which time he was doing well.  He has a history of colon cancer followed by Dr. Carlean Purl.  Colonoscopy this year revealed a ulcerated mass in the cecum.  Biopsy showed adenocarcinoma.  CT of chest abdomen and pelvis in January showed no evidence of metastatic disease.  He was admitted in February 2019 for laparoscopic assisted right colectomy.  During the admission, he had an episode of bronchitis and was treated with oral antibiotic.  Patient presents today for cardiology office visit.  He has been having some intermittent chest discomfort mainly at rest.  This last roughly between 30 seconds up to 2 to 3 minutes each.  There is no obvious correlation with physical exertion at this time.  He has no lower extremity edema, orthopnea or PND.   I recommend a treadmill Myoview to further assess.   Past Medical History:  Diagnosis Date  . Adenocarcinoma of cecum ([place]) 06/21/2017  . Arthritis    "mid  back; hands; knees" (02/24/2015)  . Basal cell carcinoma    left shoulder; mid chest; right eyelid (02/24/2015)  . CAD (coronary artery disease)    a. 08/2014 Cath/PCI: LM nl, LAD 30p, D1 95 (2.25x12 Resolute Integrity DES), LCX small, RI 100 (attempted PCI) - branches fill via L->L collats, RCA dominant, nl, RPDA/PLA nl, EF 60^. b. 02/24/2015 PCI CTO of Ramus DES x2.  . Chronic bronchitis (HCC)    hx  . Diverticulosis 05/2005  . Elevated lipase   . Fuchs' corneal dystrophy   . History of adenomatous polyp of colon 05/2005   8 mm adenoma  . History of blood transfusion    "related to some of my surgeries"  . Hyperlipidemia   . Hypertension   . Iron deficiency anemia due to chronic blood loss 06/21/2017  . Prostate cancer ([place]) 2011   S/P seed implant  . Sarcoidosis   . Thrombocytopenia ([place])    a. Noted on prior labs, unclear of what w/u done.  . Trifascicular block     Past Surgical History:  Procedure Laterality Date  . BASAL CELL CARCINOMA EXCISION Left    shoulder  . CARDIAC CATHETERIZATION N/A 02/24/2015   Procedure: Coronary/Bypass Graft CTO Intervention;  Surgeon: Jettie Booze, MD;  Location: [facility];  Service: Cardiovascular;  Laterality: N/A;  . CARDIAC CATHETERIZATION  02/24/2015   Procedure: Coronary/Graft Atherectomy;  Surgeon: Jettie Booze, MD;  Location: [facility];  Service: Cardiovascular;;  . CATARACT EXTRACTION W/ INTRAOCULAR LENS  IMPLANT, BILATERAL Bilateral ~ 2011-2012  . COLONOSCOPY W/ POLYPECTOMY  06/08/2005 and 10/18/2010   8 mm adenoma 2006, none 2012. Diverticulosis and internal hemorrhoids.  . CORNEAL TRANSPLANT Bilateral ~ 2011-2012   "@ same time as cataract OR"  . CORONARY ANGIOPLASTY WITH STENT PLACEMENT  08/2014; 02/24/2015   "1 stent + 1 stent"  . EYE SURGERY    . FINGER SURGERY Right 2014   "reattached middle finger"  . INSERTION PROSTATE RADIATION SEED  ~ 2011  . LAPAROSCOPIC CHOLECYSTECTOMY  2015  . LAPAROSCOPIC  PARTIAL COLECTOMY N/A 07/26/2017   Procedure: LAPAROSCOPIC PARTIAL COLECTOMY, EXCISION SCROTAL CYST;  Surgeon: Greer Pickerel, MD;  Location: [facility];  Service: General;  Laterality: N/A;  . LEFT HEART CATHETERIZATION WITH CORONARY ANGIOGRAM N/A 08/12/2014   Procedure: LEFT HEART CATHETERIZATION WITH CORONARY ANGIOGRAM;  Surgeon: Blane Ohara, MD;  Location: [facility];  Service: Cardiovascular;  Laterality: N/A;  . LYMPH NODE BIOPSY     mid chest  . MOHS SURGERY Right ~ 2011   "eyelid; for basal cell"  . THOROCOTOMY WITH LOBECTOMY Right ~ 1991   partial removal right lung  . TONSILLECTOMY  ~ 1950    Current Medications: Outpatient Medications Prior to Visit  Medication Sig Dispense Refill  . Cholecalciferol (VITAMIN D3) 2000 units capsule Take 2,000 Units by mouth daily.     . clopidogrel (PLAVIX) 75 MG tablet TAKE 1 TABLET (75 MG TOTAL) BY MOUTH DAILY WITH BREAKFAST. 90 tablet 2  . clotrimazole (LOTRIMIN) 1 % cream Apply 1 application topically 2 (two) times daily as needed (for rash).    . metoprolol tartrate (LOPRESSOR) 25 MG tablet Take 0.5 tablets (12.5 mg total) by mouth 2 (two) times daily. 90 tablet 3  . Multiple Vitamin (MULTIVITAMIN WITH MINERALS) TABS tablet Take 1 tablet by mouth every evening.    . nitroGLYCERIN (NITROSTAT) 0.4 MG SL tablet Place 1 tablet (0.4 mg total) under the tongue every 5 (five) minutes as needed for chest pain. X 3 doses 25 tablet 4  . Omega-3 Fatty Acids (FISH OIL PO) Take 3,400 mg by mouth daily.     [name] OVER THE COUNTER MEDICATION Take 1 drop by mouth 2 (two) times daily. CBD oil    . rosuvastatin (CRESTOR) 20 MG tablet TAKE 1 TABLET BY MOUTH EVERY DAY 90 tablet 2  . sildenafil (REVATIO) 20 MG tablet Take 60 mg by mouth as needed (for ED).     [name] silodosin (RAPAFLO) 8 MG CAPS capsule Take 8 mg by mouth daily as needed (for urination).     . traMADol (ULTRAM) 50 MG tablet Take 50-100 mg by mouth 4 (four) times daily as needed for moderate pain.     [name]  glucosamine-chondroitin 500-400 MG tablet Take 1 tablet by mouth daily.     Facility-Administered Medications Prior to Visit  Medication Dose Route Frequency Provider Last Rate Last Dose  . 0.9 %  sodium chloride infusion  500 mL Intravenous Continuous Gatha Mayer, MD         Allergies:   Patient has no known allergies.   Social History   Socioeconomic History  . Marital status: Married    Spouse name: Not on file  . Number of children: 2  . Years of education: Not on file  . Highest education level: Not on file  Occupational History  . Occupation: Unemployed  Social Needs  .  Financial resource strain: Not on file  . Food insecurity:    Worry: Not on file    Inability: Not on file  . Transportation needs:    Medical: Not on file    Non-medical: Not on file  Tobacco Use  . Smoking status: Former Smoker    Packs/day: 0.00    Years: 59.00    Pack years: 0.00    Types: Cigars  . Smokeless tobacco: Never Used  . Tobacco comment: 02/24/2015 "quit cigarettes ~ 2000 ago but smokes cigars ionce/month"  Substance and Sexual Activity  . Alcohol use: Yes    Alcohol/week: 1.2 oz    Types: 1 Glasses of wine, 1 Standard drinks or equivalent per week    Comment: Drinks maybe 1-2 drinks a week or less  . Drug use: No  . Sexual activity: Not on file  Lifestyle  . Physical activity:    Days per week: Not on file    Minutes per session: Not on file  . Stress: Not on file  Relationships  . Social connections:    Talks on phone: Not on file    Gets together: Not on file    Attends religious service: Not on file    Active member of club or organization: Not on file    Attends meetings of clubs or organizations: Not on file    Relationship status: Not on file  Other Topics Concern  . Not on file  Social History Narrative   Lives at home wife.       Family History:  The patient's family history includes Coronary artery disease (age of onset: 75) in his sister; Heart attack in  his sister; Heart disease (age of onset: 16) in his father.   ROS:   Please see the history of present illness.    ROS All other systems reviewed and are negative.   PHYSICAL EXAM:   VS:  BP (!) 128/54   Pulse 62   Ht 5\' 11"  (1.803 m)   Wt 224 lb (101.6 kg)   BMI 31.24 kg/m    GEN: Well nourished, well developed, in no acute distress  HEENT: normal  Neck: no JVD, carotid bruits, or masses Cardiac: RRR; no murmurs, rubs, or gallops,no edema  Respiratory:  clear to auscultation bilaterally, normal work of breathing GI: soft, nontender, nondistended, + BS MS: no deformity or atrophy  Skin: warm and dry, no rash Neuro:  Alert and Oriented x 3, Strength and sensation are intact Psych: euthymic mood, full affect  Wt Readings from Last 3 Encounters:  12/26/17 224 lb (101.6 kg)  08/28/17 217 lb (98.4 kg)  08/27/17 218 lb 11.2 oz (99.2 kg)      Studies/Labs Reviewed:   EKG:  EKG is ordered today.  The ekg ordered today demonstrates NSR with RBBB  Recent Labs: 07/23/2017: ALT 23 07/28/2017: BUN 7; Creatinine, Ser 0.89; Magnesium 1.8; Potassium 4.2; Sodium 137 08/27/2017: Hemoglobin 10.4; Platelet Count 193   Lipid Panel    Component Value Date/Time   CHOL 155 10/13/2015 1005   TRIG 186 (H) 10/13/2015 1005   HDL 49 10/13/2015 1005   CHOLHDL 3.2 10/13/2015 1005   VLDL 37 (H) 10/13/2015 1005   LDLCALC 69 10/13/2015 1005    Additional studies/ records that were reviewed today include:   Echo 05/30/2016 LV EF: 60% -   65% Study Conclusions  - Left ventricle: The cavity size was normal. Posterior wall   thickness was increased in a pattern of  mild LVH. Systolic   function was normal. The estimated ejection fraction was in the   range of 60% to 65%. Wall motion was normal; there were no   regional wall motion abnormalities. Doppler parameters are   consistent with abnormal left ventricular relaxation (grade 1   diastolic dysfunction). Doppler parameters are consistent  with   indeterminate ventricular filling pressure. - Aortic valve: Transvalvular velocity was within the normal range.   There was no stenosis. There was no regurgitation. - Mitral valve: Transvalvular velocity was within the normal range.   There was no evidence for stenosis. There was no regurgitation. - Right ventricle: The cavity size was normal. Wall thickness was   normal. Systolic function was normal. - Atrial septum: No defect or patent foramen ovale was identified. - Tricuspid valve: There was no regurgitation.   ASSESSMENT:    1. Chest pain, unspecified type   2. Dyspnea on exertion   3. Coronary artery disease involving native coronary artery of native heart with angina pectoris ([place])   4. Essential hypertension   5. Hyperlipidemia, unspecified hyperlipidemia type   6. Pulmonary fibrosis ([place])   7. Malignant neoplasm of colon, unspecified part of colon ([place])      PLAN:  In order of problems listed above:  1. Chest pain: Has both typical and atypical features.  Patient symptom typically occurs at rest and does not appears to have clear correlation with exertion.  It only last summer between 30 seconds up to 2 to 3 minutes.  Proceed with treadmill Myoview  2. CAD: Currently on Plavix monotherapy.  Continue metoprolol and Crestor  3. Hypertension: Blood pressure stable  4. Hyperlipidemia: Continue on Crestor 20 mg daily  5. Pulmonary fibrosis: Which may contribute to his baseline dyspnea.  Pending Myoview to rule out cardiac component of dyspnea  6. Colon cancer: Diagnosed this year.  Underwent right colectomy    Medication Adjustments/Labs and Tests Ordered: Current medicines are reviewed at length with the patient today.  Concerns regarding medicines are outlined above.  Medication changes, Labs and Tests ordered today are listed in the Patient Instructions below. Patient Instructions  Medication Instructions: Your physician recommends that you continue on your  current medications as directed. Please refer to the Current Medication list given to you today.  If you need a refill on your cardiac medications before your next appointment, please call your pharmacy.    Procedures/Testing: Your physician has requested that you have an exercise stress myoview. For further information please visit [email or web address]. Please follow instruction sheet, as given.  Hold: Metoprolol the morning of the test.   Follow-Up: Your physician wants you to follow-up in 3-4 months with Dr. Irish Lack.    Thank you for choosing [facility] at [facility]!!         Signed, Almyra Deforest, [place]  12/28/2017 11:47 PM    [address], [place], [place]  [address] Phone: [phone]; Fax: [phone]

## 2017-12-28 ENCOUNTER — Telehealth (HOSPITAL_COMMUNITY): Payer: Self-pay

## 2017-12-28 ENCOUNTER — Encounter: Payer: Self-pay | Admitting: Physician Assistant

## 2017-12-28 NOTE — Telephone Encounter (Signed)
Encounter complete. 

## 2018-01-01 NOTE — Telephone Encounter (Signed)
Patient was seen and evaluated by Almyra Deforest, PA on 12/26/17.

## 2018-01-02 ENCOUNTER — Ambulatory Visit (HOSPITAL_COMMUNITY)
Admission: RE | Admit: 2018-01-02 | Discharge: 2018-01-02 | Disposition: A | Discharge status: Home / Self Care | Payer: Medicare Other | Source: Ambulatory Visit | Attending: Cardiology | Admitting: Cardiology

## 2018-01-02 ENCOUNTER — Encounter (HOSPITAL_COMMUNITY): Payer: Self-pay | Admitting: Interventional Cardiology

## 2018-01-02 DIAGNOSIS — R0609 Other forms of dyspnea: Secondary | ICD-10-CM | POA: Insufficient documentation

## 2018-01-02 DIAGNOSIS — R079 Chest pain, unspecified: Secondary | ICD-10-CM | POA: Diagnosis present

## 2018-01-02 LAB — MYOCARDIAL PERFUSION IMAGING
CHL CUP RESTING HR STRESS: 71 {beats}/min
LV sys vol: 46 mL
LVDIAVOL: 120 mL (ref 62–150)
NUC STRESS TID: 0.91
Peak HR: 77 {beats}/min
SDS: 1
SRS: 1
SSS: 2

## 2018-01-02 MED ORDER — REGADENOSON 0.4 MG/5ML IV SOLN
0.4000 mg | Freq: Once | INTRAVENOUS | Status: AC
  Administered 2018-01-02: 0.4 mg via INTRAVENOUS

## 2018-01-02 MED ORDER — TECHNETIUM TC 99M TETROFOSMIN IV KIT
30.1000 | PACK | Freq: Once | INTRAVENOUS | Status: AC | PRN
  Administered 2018-01-02: 30.1 via INTRAVENOUS
  Filled 2018-01-02: qty 31

## 2018-01-02 MED ORDER — TECHNETIUM TC 99M TETROFOSMIN IV KIT
10.3000 | PACK | Freq: Once | INTRAVENOUS | Status: AC | PRN
  Administered 2018-01-02: 10.3 via INTRAVENOUS
  Filled 2018-01-02: qty 11

## 2018-01-03 ENCOUNTER — Ambulatory Visit: Payer: Medicare Other | Admitting: Interventional Cardiology

## 2018-01-07 ENCOUNTER — Telehealth: Payer: Self-pay | Admitting: Interventional Cardiology

## 2018-01-07 NOTE — Telephone Encounter (Signed)
New message  Pt states that he had stress test on 01/02/2018. He has questions if he can start running. pls advise.

## 2018-01-07 NOTE — Telephone Encounter (Signed)
Pt called because pt wife was in an exercise class today and another person in the class had fell to the ground in cardiac arrest. She is very concerned since the pt runs/walks 2-3 miles a day. They would like to hear if running is okay even though he just had a low risk stress test. Advised them that I will forward to [place] PA since he last saw the pt. Pt still has some chest discomfort and sob when running but relieved with rest,. Pt agrees and will wait for a phone call back.

## 2018-01-08 NOTE — Telephone Encounter (Signed)
I have called and informed the patient myself to exercise as tolerated, no need to push himself too hard in the summer. If symptom become worse, he will let us know and additional test can be considered at that time. He is also thinking about playing Pickleball, and understand he need to keep himself hydrated during the summer.

## 2018-02-27 ENCOUNTER — Encounter: Payer: Self-pay | Admitting: Nurse Practitioner

## 2018-02-27 ENCOUNTER — Telehealth: Payer: Self-pay | Admitting: Nurse Practitioner

## 2018-02-27 ENCOUNTER — Inpatient Hospital Stay: Payer: Medicare Other | Attending: Oncology

## 2018-02-27 ENCOUNTER — Inpatient Hospital Stay: Payer: Medicare Other | Admitting: Nurse Practitioner

## 2018-02-27 VITALS — BP 159/81 | HR 55 | Temp 97.7°F | Resp 17 | Ht 71.0 in | Wt 223.2 lb

## 2018-02-27 DIAGNOSIS — I2510 Atherosclerotic heart disease of native coronary artery without angina pectoris: Secondary | ICD-10-CM | POA: Diagnosis not present

## 2018-02-27 DIAGNOSIS — D869 Sarcoidosis, unspecified: Secondary | ICD-10-CM

## 2018-02-27 DIAGNOSIS — Z86010 Personal history of colonic polyps: Secondary | ICD-10-CM | POA: Diagnosis not present

## 2018-02-27 DIAGNOSIS — Z85038 Personal history of other malignant neoplasm of large intestine: Secondary | ICD-10-CM | POA: Diagnosis present

## 2018-02-27 DIAGNOSIS — C180 Malignant neoplasm of cecum: Secondary | ICD-10-CM

## 2018-02-27 DIAGNOSIS — Z9049 Acquired absence of other specified parts of digestive tract: Secondary | ICD-10-CM | POA: Insufficient documentation

## 2018-02-27 DIAGNOSIS — D509 Iron deficiency anemia, unspecified: Secondary | ICD-10-CM | POA: Insufficient documentation

## 2018-02-27 LAB — CEA (IN HOUSE-CHCC): CEA (CHCC-IN HOUSE): 2.96 ng/mL (ref 0.00–5.00)

## 2018-02-27 NOTE — Progress Notes (Signed)
  [facility] OFFICE PROGRESS NOTE   Diagnosis: Colon cancer  INTERVAL HISTORY:   Mr. Jaworski returns as scheduled.  He overall feels well.  Bowels moving regularly.  He occasionally notes a small amount of blood with bowel movements and feels this is due to hemorrhoids.  No abdominal pain.  No nausea or vomiting.  He has a good appetite.  Objective:  Vital signs in last 24 hours:  Blood pressure (!) 159/81, pulse (!) 55, temperature 97.7 F (36.5 C), temperature source Oral, resp. rate 17, height _0  (1.803 m), weight 223 lb 3.2 oz (101.2 kg), SpO2 96 %.    HEENT: Neck without mass. Lymphatics: No palpable cervical, supraclavicular, axillary or inguinal lymph nodes. Resp: Lungs clear bilaterally. Cardio: Regular rate and rhythm. GI: Abdomen soft and nontender.  No hepatospleno megaly.  No mass. Vascular: No leg edema.  Lab Results:  Lab Results  Component Value Date   WBC 6.1 08/27/2017   HGB 10.4 (L) 08/27/2017   HCT 32.9 (L) 08/27/2017   MCV 82.7 08/27/2017   PLT 193 08/27/2017   NEUTROABS 4.6 08/27/2017    Imaging:  No results found.  Medications: I have reviewed the patient's current medications.  Assessment/Plan: 1. Adenocarcinoma of the cecum, stage IIA (T3N0), status post a right colectomy 07/26/2017 ? 0/16 lymph nodes positive, no lymphovascular invasion, perineural invasion present ? MSI-stable, no loss of mismatch repair protein expression 2. History of colon polyps 3. Microcytic anemia January 2019 4. Sarcoidosis 5. Coronary artery disease  Disposition: Mr. Maysonet remains in clinical remission from colon cancer.  We will follow-up on the CEA from today.  He will be due for the one year colonoscopy in early 2020.  We made a referral to Dr. Celesta Aver office.  He will return for a CEA and follow-up visit in 6 months.  He will contact the office in the interim with any problems.    Ned Card ANP/GNP-BC   02/27/2018  9:48  AM

## 2018-02-27 NOTE — Telephone Encounter (Signed)
Scheduled appt per 9/18 los - gave patient AVS and calender per los.   

## 2018-03-01 ENCOUNTER — Telehealth: Payer: Self-pay | Admitting: Emergency Medicine

## 2018-03-01 NOTE — Telephone Encounter (Addendum)
He verbalized understanding of this   ----- Message from Owens Shark, NP sent at 02/27/2018  4:47 PM EDT ----- Please let him know the CEA is normal.  Follow-up as scheduled.

## 2018-03-25 ENCOUNTER — Other Ambulatory Visit: Payer: Self-pay | Admitting: Interventional Cardiology

## 2018-03-25 MED ORDER — METOPROLOL TARTRATE 25 MG PO TABS: 12.5000 mg | ORAL_TABLET | Freq: Two times a day (BID) | ORAL | 2 refills | Status: DC

## 2018-04-03 NOTE — Progress Notes (Signed)
Cardiology Office Note   Date:  04/04/2018   ID:  Luis [name], DOB 27-Feb-1941, MRN [id number]  PCP:  Isaias Sakai, DO    No chief complaint on file.  CAD  Wt Readings from Last 3 Encounters:  04/04/18 228 lb (103.4 kg)  02/27/18 223 lb 3.2 oz (101.2 kg)  01/02/18 224 lb (101.6 kg)       History of Present Illness: Luis [name] is a 77 y.o. male  with PMH of CAD, hypertension, hyperlipidemia, prostate cancer s/p seed implantation, pulmonary fibrosis and history of sarcoidosis.  He has a history of DES to diagonal in March 2016, ramus intermedius was totally occluded at the time.  He eventually underwent CTO PCI with overlapping DES x2 to ramus in September 2016.  In December 2017, he had an episode of chest pain relieved with nitroglycerin.  Ranexa was started.  Colonoscopy in early 2019 revealed a ulcerated mass in the cecum.  Biopsy showed adenocarcinoma.  CT of chest abdomen and pelvis in January showed no evidence of metastatic disease.  He was admitted in February 2019 for laparoscopic assisted right colectomy.  During the admission, he had an episode of bronchitis and was treated with oral antibiotic.  Stress test in July 2019 showed:  The left ventricular ejection fraction is normal (55-65%).  Nuclear stress EF: 62%.  No T wave inversion was noted during stress.  There was no ST segment deviation noted during stress.  This is a low risk study.  The study is normal.   Normal perfusion. LVEF 62% with normal wall motion. This is a low risk study.  Since that time, he has done well.  Denies : Chest pain. Dizziness. Leg edema. Nitroglycerin use. Orthopnea. Palpitations. Paroxysmal nocturnal dyspnea. Shortness of breath. Syncope.   He alternates jogging and walking, several times a week. He has joined [employer] and los some weight.  Past Medical History:  Diagnosis Date  . Adenocarcinoma of cecum ([place]) 06/21/2017  . Arthritis    "mid  back; hands; knees" (02/24/2015)  . Basal cell carcinoma    left shoulder; mid chest; right eyelid (02/24/2015)  . CAD (coronary artery disease)    a. 08/2014 Cath/PCI: LM nl, LAD 30p, D1 95 (2.25x12 Resolute Integrity DES), LCX small, RI 100 (attempted PCI) - branches fill via L->L collats, RCA dominant, nl, RPDA/PLA nl, EF 60^. b. 02/24/2015 PCI CTO of Ramus DES x2.  . Chronic bronchitis (HCC)    hx  . Diverticulosis 05/2005  . Elevated lipase   . Fuchs' corneal dystrophy   . History of adenomatous polyp of colon 05/2005   8 mm adenoma  . History of blood transfusion    "related to some of my surgeries"  . Hyperlipidemia   . Hypertension   . Iron deficiency anemia due to chronic blood loss 06/21/2017  . Prostate cancer ([place]) 2011   S/P seed implant  . Sarcoidosis   . Thrombocytopenia ([place])    a. Noted on prior labs, unclear of what w/u done.  . Trifascicular block     Past Surgical History:  Procedure Laterality Date  . BASAL CELL CARCINOMA EXCISION Left    shoulder  . CARDIAC CATHETERIZATION N/A 02/24/2015   Procedure: Coronary/Bypass Graft CTO Intervention;  Surgeon: Jettie Booze, MD;  Location: [facility];  Service: Cardiovascular;  Laterality: N/A;  . CARDIAC CATHETERIZATION  02/24/2015   Procedure: Coronary/Graft Atherectomy;  Surgeon: Jettie Booze, MD;  Location: [facility];  Service: Cardiovascular;;  . CATARACT EXTRACTION W/ INTRAOCULAR LENS  IMPLANT, BILATERAL Bilateral ~ 2011-2012  . COLONOSCOPY W/ POLYPECTOMY  06/08/2005 and 10/18/2010   8 mm adenoma 2006, none 2012. Diverticulosis and internal hemorrhoids.  . CORNEAL TRANSPLANT Bilateral ~ 2011-2012   "@ same time as cataract OR"  . CORONARY ANGIOPLASTY WITH STENT PLACEMENT  08/2014; 02/24/2015   "1 stent + 1 stent"  . EYE SURGERY    . FINGER SURGERY Right 2014   "reattached middle finger"  . INSERTION PROSTATE RADIATION SEED  ~ 2011  . LAPAROSCOPIC CHOLECYSTECTOMY  2015  . LAPAROSCOPIC  PARTIAL COLECTOMY N/A 07/26/2017   Procedure: LAPAROSCOPIC PARTIAL COLECTOMY, EXCISION SCROTAL CYST;  Surgeon: Greer Pickerel, MD;  Location: [facility];  Service: General;  Laterality: N/A;  . LEFT HEART CATHETERIZATION WITH CORONARY ANGIOGRAM N/A 08/12/2014   Procedure: LEFT HEART CATHETERIZATION WITH CORONARY ANGIOGRAM;  Surgeon: Blane Ohara, MD;  [facility];  Service: Cardiovascular;  Laterality: N/A;  . LYMPH NODE BIOPSY     mid chest  . MOHS SURGERY Right ~ 2011   "eyelid; for basal cell"  . THOROCOTOMY WITH LOBECTOMY Right ~ 1991   partial removal right lung  . TONSILLECTOMY  ~ 1950     Current Outpatient Medications  Medication Sig Dispense Refill  . aspirin EC 81 MG tablet Take 81 mg by mouth daily.    . Cholecalciferol (VITAMIN D3) 2000 units capsule Take 2,000 Units by mouth daily.     . clopidogrel (PLAVIX) 75 MG tablet TAKE 1 TABLET (75 MG TOTAL) BY MOUTH DAILY WITH BREAKFAST. 90 tablet 2  . metoprolol tartrate (LOPRESSOR) 25 MG tablet Take 0.5 tablets (12.5 mg total) by mouth 2 (two) times daily. 90 tablet 2  . Multiple Vitamin (MULTIVITAMIN WITH MINERALS) TABS tablet Take 1 tablet by mouth every evening.    . nitroGLYCERIN (NITROSTAT) 0.4 MG SL tablet Place 1 tablet (0.4 mg total) under the tongue every 5 (five) minutes as needed for chest pain. X 3 doses 25 tablet 4  . OVER THE COUNTER MEDICATION Take 1 drop by mouth 2 (two) times daily. CBD oil    . rosuvastatin (CRESTOR) 20 MG tablet TAKE 1 TABLET BY MOUTH EVERY DAY 90 tablet 2  . sildenafil (REVATIO) 20 MG tablet Take 60 mg by mouth as needed (for ED).     [name] silodosin (RAPAFLO) 8 MG CAPS capsule Take 8 mg by mouth daily as needed (for urination).     . traMADol (ULTRAM) 50 MG tablet Take 50-100 mg by mouth 4 (four) times daily as needed for moderate pain.     [name] triamcinolone cream (KENALOG) 0.5 % as needed for rash. APPLY TOPICALLY 2-3 TIMES A DAY TO SCAR FOR 2-3 WEEKS THEN AS NEEDED  2   Current  Facility-Administered Medications  Medication Dose Route Frequency Provider Last Rate Last Dose  . 0.9 %  sodium chloride infusion  500 mL Intravenous Continuous Gatha Mayer, MD        Allergies:   Patient has no known allergies.    Social History:  The patient  reports that he has quit smoking. His smoking use included cigars. He smoked 0.00 packs per day for 59.00 years. He has never used smokeless tobacco. He reports that he drinks about 2.0 standard drinks of alcohol per week. He reports that he does not use drugs.   Family History:  The patient's family history includes Coronary artery disease (age of onset:  24) in his sister; Heart attack in his sister; Heart disease (age of onset: 55) in his father.    ROS:  Please see the history of present illness.   Otherwise, review of systems are positive for intentional weight loss.   All other systems are reviewed and negative.    PHYSICAL EXAM: VS:  BP 128/76   Pulse 60   Ht 5\' 11"  (1.803 m)   Wt 228 lb (103.4 kg)   SpO2 93%   BMI 31.80 kg/m  , BMI Body mass index is 31.8 kg/m. GEN: Well nourished, well developed, in no acute distress  HEENT: normal  Neck: no JVD, carotid bruits, or masses Cardiac: RRR; no murmurs, rubs, or gallops,no edema  Respiratory:  clear to auscultation bilaterally, normal work of breathing GI: soft, nontender, nondistended, + BS MS: no deformity or atrophy ; brace on left wrist Skin: warm and dry, no rash Neuro:  Strength and sensation are intact Psych: euthymic mood, full affect    Recent Labs: 07/23/2017: ALT 23 07/28/2017: BUN 7; Creatinine, Ser 0.89; Magnesium 1.8; Potassium 4.2; Sodium 137 08/27/2017: Hemoglobin 10.4; Platelet Count 193   Lipid Panel    Component Value Date/Time   CHOL 155 10/13/2015 1005   TRIG 186 (H) 10/13/2015 1005   HDL 49 10/13/2015 1005   CHOLHDL 3.2 10/13/2015 1005   VLDL 37 (H) 10/13/2015 1005   LDLCALC 69 10/13/2015 1005     Other studies  Reviewed: Additional studies/ records that were reviewed today with results demonstrating: 2019 stress test reviewed.   ASSESSMENT AND PLAN:  1. CAD: Angina controlled on medical therapy.  Continue aggressive secondary prevention.  Continue healthy lifestyle. 2. HTN: The current medical regimen is effective;  continue present plan and medications. 3. Hyperlipidemia: He will get fasting lipids with his PMD.  It is more convenient to get blood work then.  4. Colon cancer: s/p right colectomy. Doing well.  5. Carotid artery disease: Moderate.  Due for f/u Doppler in 2019.  Will schedule.   Current medicines are reviewed at length with the patient today.  The patient concerns regarding his medicines were addressed.  The following changes have been made:  No change  Labs/ tests ordered today include:  No orders of the defined types were placed in this encounter.   Recommend 150 minutes/week of aerobic exercise Low fat, low carb, high fiber diet recommended  Disposition:   FU in 1 year   Signed, Larae Grooms, MD  04/04/2018 10:21 AM    [facility] [address], [place], [place]  [address] Phone: [phone]; Fax: [phone]

## 2018-04-04 ENCOUNTER — Ambulatory Visit: Payer: Medicare Other | Admitting: Interventional Cardiology

## 2018-04-04 ENCOUNTER — Encounter: Payer: Self-pay | Admitting: Interventional Cardiology

## 2018-04-04 VITALS — BP 128/76 | HR 60 | Ht 71.0 in | Wt 228.0 lb

## 2018-04-04 DIAGNOSIS — I739 Peripheral vascular disease, unspecified: Secondary | ICD-10-CM | POA: Diagnosis not present

## 2018-04-04 DIAGNOSIS — E785 Hyperlipidemia, unspecified: Secondary | ICD-10-CM | POA: Diagnosis not present

## 2018-04-04 DIAGNOSIS — I10 Essential (primary) hypertension: Secondary | ICD-10-CM | POA: Diagnosis not present

## 2018-04-04 DIAGNOSIS — I779 Disorder of arteries and arterioles, unspecified: Secondary | ICD-10-CM

## 2018-04-04 DIAGNOSIS — I25119 Atherosclerotic heart disease of native coronary artery with unspecified angina pectoris: Secondary | ICD-10-CM | POA: Diagnosis not present

## 2018-04-04 NOTE — Patient Instructions (Signed)
Medication Instructions:  Your physician recommends that you continue on your current medications as directed. Please refer to the Current Medication list given to you today.  If you need a refill on your cardiac medications before your next appointment, please call your pharmacy.   Lab work: Your physician recommends that you have FASTING lipid profile and complete metabolic panel at your Primary [place] office--Prescription given   If you have labs (blood work) drawn today and your tests are completely normal, you will receive your results only by: [name] MyChart Message (if you have MyChart) OR . A paper copy in the mail If you have any lab test that is abnormal or we need to change your treatment, we will call you to review the results.  Testing/Procedures: Your physician has requested that you have a carotid duplex. This test is an ultrasound of the carotid arteries in your neck. It looks at blood flow through these arteries that supply the brain with blood. Allow one hour for this exam. There are no restrictions or special instructions.   Follow-Up: At [facility], you and your health needs are our priority.  As part of our continuing mission to provide you with exceptional heart care, we have created designated Provider Care Teams.  These Care Teams include your primary Cardiologist (physician) and Advanced Practice Providers (APPs -  Physician Assistants and Nurse Practitioners) who all work together to provide you with the care you need, when you need it. . You will need a follow up appointment in 1 year.  Please call our office 2 months in advance to schedule this appointment.  You may see Casandra Doffing, MD or one of the following Advanced Practice Providers on your designated Care Team:   . Lyda Jester, PA-C . Dayna Dunn, PA-C . Ermalinda Barrios, PA-C  Any Other Special Instructions Will Be Listed Below (If Applicable).

## 2018-04-16 ENCOUNTER — Other Ambulatory Visit: Payer: Self-pay | Admitting: Interventional Cardiology

## 2018-04-16 DIAGNOSIS — I6523 Occlusion and stenosis of bilateral carotid arteries: Secondary | ICD-10-CM

## 2018-04-22 ENCOUNTER — Ambulatory Visit (HOSPITAL_COMMUNITY)
Admission: RE | Admit: 2018-04-22 | Discharge: 2018-04-22 | Disposition: A | Discharge status: Home / Self Care | Payer: Medicare Other | Source: Ambulatory Visit | Attending: Cardiology | Admitting: Cardiology

## 2018-04-22 DIAGNOSIS — I6523 Occlusion and stenosis of bilateral carotid arteries: Secondary | ICD-10-CM

## 2018-04-23 ENCOUNTER — Telehealth: Payer: Self-pay

## 2018-04-23 DIAGNOSIS — I6523 Occlusion and stenosis of bilateral carotid arteries: Secondary | ICD-10-CM

## 2018-04-23 NOTE — Telephone Encounter (Signed)
Called and made patient aware that his right internal carotid artery has occluded, but the other three arteries to the brain are compensating well.  Made patient aware that there was no significant blockage on the left side. Made patient aware that it is not recommended to try to unblock an occluded carotid when the body is giving adequate flow from the other branches. Made patient aware that we will continue to monitor by performing a carotid doppler every year and continue with aggressive preventive therapy including lipid lowering therapy. Patient verbalized understanding and denied having any questions at this time. Repeat study ordered for 1 year.

## 2018-04-23 NOTE — Telephone Encounter (Signed)
-----   Message from Jettie Booze, MD sent at 04/22/2018  6:37 PM EST ----- It appears the right internal carotid has occluded, but the other three arteries to the brain are compensating well.  No significant blockage on the left carotid or in either vertebrals.  It is not recommended to try to unblock an occluded carotid when the body is giving adequate flow from the other branches.  WOuld continue annual carotid DOppler and aggressive rpeventive therapy including lipid lowering therapy.

## 2018-05-19 ENCOUNTER — Other Ambulatory Visit: Payer: Self-pay | Admitting: Interventional Cardiology

## 2018-06-26 ENCOUNTER — Encounter: Payer: Self-pay | Admitting: Internal Medicine

## 2018-06-26 ENCOUNTER — Ambulatory Visit: Payer: Medicare Other | Admitting: Internal Medicine

## 2018-06-26 ENCOUNTER — Telehealth: Payer: Self-pay

## 2018-06-26 VITALS — BP 140/72 | HR 64 | Ht 71.0 in | Wt 234.0 lb

## 2018-06-26 DIAGNOSIS — I2510 Atherosclerotic heart disease of native coronary artery without angina pectoris: Secondary | ICD-10-CM

## 2018-06-26 DIAGNOSIS — Z85038 Personal history of other malignant neoplasm of large intestine: Secondary | ICD-10-CM | POA: Diagnosis not present

## 2018-06-26 DIAGNOSIS — Z7902 Long term (current) use of antithrombotics/antiplatelets: Secondary | ICD-10-CM | POA: Diagnosis not present

## 2018-06-26 DIAGNOSIS — Z9861 Coronary angioplasty status: Secondary | ICD-10-CM | POA: Diagnosis not present

## 2018-06-26 NOTE — Progress Notes (Signed)
Luis [name] 78 y.o. [birth year] [id number]  Assessment & Plan:   Encounter Diagnoses  Name Primary?  . Personal history of colon cancer Yes  . Long term current use of antithrombotics/antiplatelets   . CAD S/P percutaneous coronary angioplasty     Schedule surveillance colonoscopy.  Hold clopidogrel 5 days before and clarify with cardiology.  Okay to continue aspirin in my mind.  The risks and benefits as well as alternatives of endoscopic procedure(s) have been discussed and reviewed. All questions answered. The patient agrees to proceed.   Subjective:   Chief Complaint: History of colon cancer  HPI The patient is a 79 year old white man that had a  colonoscopy about a year ago and was found to have a cecal carcinoma.  He had a resection and has not required chemotherapy.  It was stage IIa.  He is not having any bowel issues.  He takes Plavix with a history of coronary artery disease and prior stenting many years ago but is not having cardiac symptoms. No Known Allergies Current Meds  Medication Sig  . aspirin EC 81 MG tablet Take 81 mg by mouth daily.  . Cholecalciferol (VITAMIN D3) 2000 units capsule Take 2,000 Units by mouth daily.   . clopidogrel (PLAVIX) 75 MG tablet TAKE 1 TABLET (75 MG TOTAL) BY MOUTH DAILY WITH BREAKFAST.  . metoprolol tartrate (LOPRESSOR) 25 MG tablet Take 0.5 tablets (12.5 mg total) by mouth 2 (two) times daily.  . Multiple Vitamin (MULTIVITAMIN WITH MINERALS) TABS tablet Take 1 tablet by mouth every evening.  . nitroGLYCERIN (NITROSTAT) 0.4 MG SL tablet Place 1 tablet (0.4 mg total) under the tongue every 5 (five) minutes as needed for chest pain. X 3 doses  . OVER THE COUNTER MEDICATION Take 1 drop by mouth 2 (two) times daily. CBD oil  . rosuvastatin (CRESTOR) 20 MG tablet TAKE 1 TABLET BY MOUTH EVERY DAY  . sildenafil (REVATIO) 20 MG tablet Take 60 mg by mouth as needed (for ED).   [name] silodosin (RAPAFLO) 8 MG CAPS capsule Take 8 mg by mouth  daily as needed (for urination).   . traMADol (ULTRAM) 50 MG tablet Take 50-100 mg by mouth 4 (four) times daily as needed for moderate pain.   [name] triamcinolone cream (KENALOG) 0.5 % as needed for rash. APPLY TOPICALLY 2-3 TIMES A DAY TO SCAR FOR 2-3 WEEKS THEN AS NEEDED   Past Medical History:  Diagnosis Date  . Adenocarcinoma of cecum ([place]) 06/21/2017  . Arthritis    "mid back; hands; knees" (02/24/2015)  . Basal cell carcinoma    left shoulder; mid chest; right eyelid (02/24/2015)  . CAD (coronary artery disease)    a. 08/2014 Cath/PCI: LM nl, LAD 30p, D1 95 (2.25x12 Resolute Integrity DES), LCX small, RI 100 (attempted PCI) - branches fill via L->L collats, RCA dominant, nl, RPDA/PLA nl, EF 60^. b. 02/24/2015 PCI CTO of Ramus DES x2.  . Chronic bronchitis (HCC)    hx  . Diverticulosis 05/2005  . Elevated lipase   . Fuchs' corneal dystrophy   . History of adenomatous polyp of colon 05/2005   8 mm adenoma  . History of blood transfusion    "related to some of my surgeries"  . Hyperlipidemia   . Hypertension   . Iron deficiency anemia due to chronic blood loss 06/21/2017  . Prostate cancer ([place]) 2011   S/P seed implant  . Sarcoidosis   . Thrombocytopenia ([place])    a. Noted on prior labs, unclear  of what w/u done.  . Trifascicular block    Past Surgical History:  Procedure Laterality Date  . BASAL CELL CARCINOMA EXCISION Left    shoulder  . CARDIAC CATHETERIZATION N/A 02/24/2015   Procedure: Coronary/Bypass Graft CTO Intervention;  Surgeon: Jettie Booze, MD;  Location: [facility];  Service: Cardiovascular;  Laterality: N/A;  . CARDIAC CATHETERIZATION  02/24/2015   Procedure: Coronary/Graft Atherectomy;  Surgeon: Jettie Booze, MD;  Location: [facility];  Service: Cardiovascular;;  . CATARACT EXTRACTION W/ INTRAOCULAR LENS  IMPLANT, BILATERAL Bilateral ~ 2011-2012  . COLONOSCOPY W/ POLYPECTOMY  06/08/2005 and 10/18/2010   8 mm adenoma 2006, none 2012.  Diverticulosis and internal hemorrhoids.  . CORNEAL TRANSPLANT Bilateral ~ 2011-2012   "@ same time as cataract OR"  . CORONARY ANGIOPLASTY WITH STENT PLACEMENT  08/2014; 02/24/2015   "1 stent + 1 stent"  . EYE SURGERY    . FINGER SURGERY Right 2014   "reattached middle finger"  . INSERTION PROSTATE RADIATION SEED  ~ 2011  . LAPAROSCOPIC CHOLECYSTECTOMY  2015  . LAPAROSCOPIC PARTIAL COLECTOMY N/A 07/26/2017   Procedure: LAPAROSCOPIC PARTIAL COLECTOMY, EXCISION SCROTAL CYST;  Surgeon: Greer Pickerel, MD;  [facility]: [facility];  Service: General;  Laterality: N/A;  . LEFT HEART CATHETERIZATION WITH CORONARY ANGIOGRAM N/A 08/12/2014   Procedure: LEFT HEART CATHETERIZATION WITH CORONARY ANGIOGRAM;  Surgeon: Blane Ohara, MD;  Location: [facility] CATH LAB;  Service: Cardiovascular;  Laterality: N/A;  . LYMPH NODE BIOPSY     mid chest  . MOHS SURGERY Right ~ 2011   "eyelid; for basal cell"  . THOROCOTOMY WITH LOBECTOMY Right ~ 1991   partial removal right lung  . TONSILLECTOMY  ~ 38   Social History   Social History Narrative   Lives at home wife.     family history includes Coronary artery disease (age of onset: 56) in his sister; Heart attack in his sister; Heart disease (age of onset: 32) in his father.   Review of Systems As above  Objective:   Physical Exam BP 140/72   Pulse 64   Ht 5\' 11"  (1.803 m)   Wt 234 lb (106.1 kg)   BMI 32.64 kg/m  NAD

## 2018-06-26 NOTE — Telephone Encounter (Signed)
[facility] Pre-operative Risk Assessment     Request for surgical clearance:     Endoscopy Procedure  What type of surgery is being performed?     colonscopy  When is this surgery scheduled?     07/26/2018  What type of clearance is required ?   Pharmacy  Are there any medications that need to be held prior to surgery and how long? Plavix for 5 days  Practice name and name of physician performing surgery?      [facility]  What is your office phone and fax number?      Phone- [phone]  [phone]  Anesthesia type (None, local, MAC, general) ?       MAC

## 2018-06-26 NOTE — Patient Instructions (Addendum)
You have been scheduled for a colonoscopy. Please follow written instructions given to you at your visit today.  Please pick up your prep supplies at the pharmacy within the next 1-3 days. If you use inhalers (even only as needed), please bring them with you on the day of your procedure.   You will be contacted by our office prior to your procedure for directions on holding your Plavix.  If you do not hear from our office 1 week prior to your scheduled procedure, please call [phone] to discuss.     I appreciate the opportunity to care for you. Silvano Rusk, MD, [facility]  .hospital

## 2018-07-03 ENCOUNTER — Telehealth: Payer: Self-pay

## 2018-07-03 NOTE — Telephone Encounter (Signed)
Patient informed okay to hold his plavix 5 days prior to procedure per Cardiology. He verbalized understanding. He said he has some questions he's going to write down and call back with.

## 2018-07-03 NOTE — Telephone Encounter (Signed)
   Primary Cardiologist: Dr Irish Lack  Chart reviewed as part of pre-operative protocol coverage. Given past medical history and time since last visit, based on ACC/AHA guidelines, Luis [name] would be at acceptable risk for the planned procedure without further cardiovascular testing.   OK to hold Plavix 5 days pre op if needed.   I will route this recommendation to the requesting party via Epic fax function and remove from pre-op pool.  Please call with questions.  Kerin Ransom, PA-C 07/03/2018, 12:44 PM

## 2018-07-03 NOTE — Telephone Encounter (Signed)
OK to hold Plavix 5 days prior to colonoscopy.  

## 2018-07-03 NOTE — Telephone Encounter (Signed)
Dr Valentino Hue to hold Plavix for 5 days pre colonoscopy?  Please respond to CV DIV PRE OP  Kailani Brass PA-C 07/03/2018 8:41 AM

## 2018-07-23 ENCOUNTER — Telehealth: Payer: Self-pay | Admitting: Internal Medicine

## 2018-07-23 NOTE — Telephone Encounter (Signed)
Questions answered about his diet before his procedure.

## 2018-07-23 NOTE — Telephone Encounter (Signed)
Please call pt, he has some questions regarding his procedure on Friday.

## 2018-07-26 ENCOUNTER — Ambulatory Visit (AMBULATORY_SURGERY_CENTER): Payer: Medicare Other | Admitting: Internal Medicine

## 2018-07-26 ENCOUNTER — Encounter: Payer: Self-pay | Admitting: Internal Medicine

## 2018-07-26 VITALS — BP 153/67 | HR 66 | Temp 98.9°F | Resp 8 | Ht 71.0 in | Wt 234.0 lb

## 2018-07-26 DIAGNOSIS — Z85038 Personal history of other malignant neoplasm of large intestine: Secondary | ICD-10-CM | POA: Diagnosis not present

## 2018-07-26 MED ORDER — SODIUM CHLORIDE 0.9 % IV SOLN: 500.0000 mL | Freq: Once | INTRAVENOUS | Status: DC

## 2018-07-26 NOTE — Op Note (Signed)
[place] Patient Name: Luis [name] Procedure Date: 07/26/2018 3:18 PM MRN: [id number] Endoscopist: Gatha Mayer , MD Age: 78 Referring MD:  Date of Birth: 24-Mar-1941 Gender: Male Account #: [id number] Procedure:                Colonoscopy Indications:              High risk colon cancer surveillance: Personal                            history of colon cancer Medicines:                Propofol per Anesthesia, Monitored Anesthesia Care Procedure:                Pre-Anesthesia Assessment:                           - Prior to the procedure, a History and Physical                            was performed, and patient medications and                            allergies were reviewed. The patient's tolerance of                            previous anesthesia was also reviewed. The risks                            and benefits of the procedure and the sedation                            options and risks were discussed with the patient.                            All questions were answered, and informed consent                            was obtained. Prior Anticoagulants: The patient has                            taken no previous anticoagulant or antiplatelet                            agents. ASA Grade Assessment: III - A patient with                            severe systemic disease. After reviewing the risks                            and benefits, the patient was deemed in                            satisfactory condition to undergo the procedure.  After obtaining informed consent, the colonoscope                            was passed under direct vision. Throughout the                            procedure, the patient's blood pressure, pulse, and                            oxygen saturations were monitored continuously. The                            Colonoscope was introduced through the anus and                            advanced to the  the ileocolonic anastomosis. The                            colonoscopy was performed without difficulty. The                            patient tolerated the procedure well. The quality                            of the bowel preparation was good. The rectum and                            Ileocolonic anastomsis areas were photographed. The                            bowel preparation used was Miralax. Scope In: 3:33:09 PM Scope Out: 3:44:18 PM Scope Withdrawal Time: 0 hours 9 minutes 10 seconds  Total Procedure Duration: 0 hours 11 minutes 9 seconds  Findings:                 The perianal and digital rectal examinations were                            normal.                           Multiple diverticula were found in the sigmoid                            colon.                           There was evidence of a prior end-to-side                            ileo-colonic anastomosis in the transverse colon.                            This was patent and was characterized by healthy  appearing mucosa.                           The exam was otherwise without abnormality on                            direct and retroflexion views. Complications:            No immediate complications. Estimated Blood Loss:     Estimated blood loss: none. Impression:               - Moderate diverticulosis in the sigmoid colon.                           - Patent end-to-side ileo-colonic anastomosis,                            characterized by healthy appearing mucosa.                           - The examination was otherwise normal on direct                            and retroflexion views.                           - No specimens collected.                           - Personal history of colonic polyps.                           - Personal history of malignant neoplasm of the                            colon. Recommendation:           - Patient has a contact number available for                             emergencies. The signs and symptoms of potential                            delayed complications were discussed with the                            patient. Return to normal activities tomorrow.                            Written discharge instructions were provided to the                            patient.                           - Resume previous diet.                           -  Continue present medications.                           - Repeat colonoscopy in 3 years for surveillance. Gatha Mayer, MD 07/26/2018 3:51:33 PM This report has been signed electronically.

## 2018-07-26 NOTE — Patient Instructions (Addendum)
No polyps or cancer seen today.  Guidelines indicate repeating a colonoscopy in 3 years - if doing well then we can do it.  I appreciate the opportunity to care for you. Gatha Mayer, MD, [facility]   Handout on diverticulosis given to you today Resume Plavix today per Dr Carlean Purl  YOU HAD AN ENDOSCOPIC PROCEDURE TODAY AT THE [facility]:   Refer to the procedure report that was given to you for any specific questions about what was found during the examination.  If the procedure report does not answer your questions, please call your gastroenterologist to clarify.  If you requested that your care partner not be given the details of your procedure findings, then the procedure report has been included in a sealed envelope for you to review at your convenience later.  YOU SHOULD EXPECT: Some feelings of bloating in the abdomen. Passage of more gas than usual.  Walking can help get rid of the air that was put into your GI tract during the procedure and reduce the bloating. If you had a lower endoscopy (such as a colonoscopy or flexible sigmoidoscopy) you may notice spotting of blood in your stool or on the toilet paper. If you underwent a bowel prep for your procedure, you may not have a normal bowel movement for a few days.  Please Note:  You might notice some irritation and congestion in your nose or some drainage.  This is from the oxygen used during your procedure.  There is no need for concern and it should clear up in a day or so.  SYMPTOMS TO REPORT IMMEDIATELY:   Following lower endoscopy (colonoscopy or flexible sigmoidoscopy):  Excessive amounts of blood in the stool  Significant tenderness or worsening of abdominal pains  Swelling of the abdomen that is new, acute  Fever of 100F or higher    For urgent or emergent issues, a gastroenterologist can be reached at any hour by calling [phone].   DIET:  We do recommend a small meal at first, but then you may  proceed to your regular diet.  Drink plenty of fluids but you should avoid alcoholic beverages for 24 hours.  ACTIVITY:  You should plan to take it easy for the rest of today and you should NOT DRIVE or use heavy machinery until tomorrow (because of the sedation medicines used during the test).    FOLLOW UP: Our staff will call the number listed on your records the next business day following your procedure to check on you and address any questions or concerns that you may have regarding the information given to you following your procedure. If we do not reach you, we will leave a message.  However, if you are feeling well and you are not experiencing any problems, there is no need to return our call.  We will assume that you have returned to your regular daily activities without incident.  If any biopsies were taken you will be contacted by phone or by letter within the next 1-3 weeks.  Please call us at [phone] if you have not heard about the biopsies in 3 weeks.    SIGNATURES/CONFIDENTIALITY: You and/or your care partner have signed paperwork which will be entered into your electronic medical record.  These signatures attest to the fact that that the information above on your After Visit Summary has been reviewed and is understood.  Full responsibility of the confidentiality of this discharge information lies with you and/or your care-partner.

## 2018-07-26 NOTE — Progress Notes (Signed)
To PACU, VSS. Report to [email or web address] 

## 2018-07-29 ENCOUNTER — Telehealth: Payer: Self-pay

## 2018-07-29 NOTE — Telephone Encounter (Signed)
  Follow up Call-  Call back number 07/26/2018 06/20/2017  Post procedure Call Back phone  # [phone]  Permission to leave phone message Yes Yes  Some recent data might be hidden     Patient questions:  Do you have a fever, pain , or abdominal swelling? No. Pain Score  0 *  Have you tolerated food without any problems? Yes.    Have you been able to return to your normal activities? Yes.    Do you have any questions about your discharge instructions: Diet   No. Medications  No. Follow up visit  No.  Do you have questions or concerns about your Care? No.  Actions: * If pain score is 4 or above: No action needed, pain <4.

## 2018-08-23 ENCOUNTER — Other Ambulatory Visit: Payer: Self-pay | Admitting: Cardiology

## 2018-08-27 ENCOUNTER — Inpatient Hospital Stay: Payer: Medicare Other

## 2018-08-27 ENCOUNTER — Inpatient Hospital Stay: Payer: Medicare Other | Admitting: Oncology

## 2018-09-26 ENCOUNTER — Other Ambulatory Visit: Payer: Medicare Other

## 2018-09-26 ENCOUNTER — Ambulatory Visit: Payer: Medicare Other | Admitting: Oncology

## 2018-11-25 ENCOUNTER — Other Ambulatory Visit: Payer: Self-pay

## 2018-11-25 ENCOUNTER — Telehealth: Payer: Self-pay | Admitting: Oncology

## 2018-11-25 ENCOUNTER — Inpatient Hospital Stay (HOSPITAL_BASED_OUTPATIENT_CLINIC_OR_DEPARTMENT_OTHER): Payer: Medicare Other | Admitting: Oncology

## 2018-11-25 ENCOUNTER — Inpatient Hospital Stay: Payer: Medicare Other | Attending: Oncology

## 2018-11-25 VITALS — BP 151/70 | HR 68 | Temp 98.7°F | Resp 17 | Ht 71.0 in | Wt 235.8 lb

## 2018-11-25 DIAGNOSIS — Z8719 Personal history of other diseases of the digestive system: Secondary | ICD-10-CM | POA: Insufficient documentation

## 2018-11-25 DIAGNOSIS — D509 Iron deficiency anemia, unspecified: Secondary | ICD-10-CM

## 2018-11-25 DIAGNOSIS — C180 Malignant neoplasm of cecum: Secondary | ICD-10-CM | POA: Diagnosis present

## 2018-11-25 DIAGNOSIS — I2510 Atherosclerotic heart disease of native coronary artery without angina pectoris: Secondary | ICD-10-CM | POA: Diagnosis not present

## 2018-11-25 DIAGNOSIS — D869 Sarcoidosis, unspecified: Secondary | ICD-10-CM | POA: Diagnosis not present

## 2018-11-25 DIAGNOSIS — Z79899 Other long term (current) drug therapy: Secondary | ICD-10-CM | POA: Diagnosis not present

## 2018-11-25 LAB — CEA (IN HOUSE-CHCC): CEA (CHCC-In House): 5.81 ng/mL — ABNORMAL HIGH (ref 0.00–5.00)

## 2018-11-25 NOTE — Progress Notes (Signed)
  Luis [name] OFFICE PROGRESS NOTE   Diagnosis: Colon cancer  INTERVAL HISTORY:   Luis [name] returns as scheduled.  He feels well.  Good appetite.  No difficulty with bowel function.  He recently had a basal cell carcinoma removed from the left face. He underwent a colonoscopy by Dr. Katha Cabal on 07/26/2018.  No polyps were found.  Objective:  Vital signs in last 24 hours:  Blood pressure (!) 151/70, pulse 68, temperature 98.7 F (37.1 C), temperature source Oral, resp. rate 17, height '5\' 11"'$  (1.803 m), weight 235 lb 12.8 oz (107 kg), SpO2 96 %. Limited physical examination secondary to distancing with the COVID pandemic  HEENT: Neck without mass Lymphatics: No cervical, supraclavicular, axillary, or inguinal nodes.  Prominent bilateral axillary and inguinal fat pads GI: No hepatosplenomegaly, no mass, nontender Vascular: No leg edema   Lab Results:  Lab Results  Component Value Date   WBC 6.1 08/27/2017   HGB 10.4 (L) 08/27/2017   HCT 32.9 (L) 08/27/2017   MCV 82.7 08/27/2017   PLT 193 08/27/2017   NEUTROABS 4.6 08/27/2017    CMP  Lab Results  Component Value Date   NA 137 07/28/2017   K 4.2 07/28/2017   CL 105 07/28/2017   CO2 26 07/28/2017   GLUCOSE 122 (H) 07/28/2017   BUN 7 07/28/2017   CREATININE 0.89 07/28/2017   CALCIUM 8.2 (L) 07/28/2017   PROT 6.1 (L) 07/23/2017   ALBUMIN 3.5 07/23/2017   AST 30 07/23/2017   ALT 23 07/23/2017   ALKPHOS 67 07/23/2017   BILITOT 0.6 07/23/2017   GFRNONAA >60 07/28/2017   GFRAA >60 07/28/2017    Lab Results  Component Value Date   CEA1 2.96 02/27/2018    Medications: I have reviewed the patient's current medications.   Assessment/Plan: 1. Adenocarcinoma of the cecum, stage IIA (T3N0), status post a right colectomy 07/26/2017 ? 0/16 lymph nodes positive, no lymphovascular invasion, perineural invasion present ? MSI-stable, no loss of mismatch repair protein expression ? Surveillance colonoscopy  07/26/2018-no polyps found 2. History of colon polyps 3. Microcytic anemia January 2019 4. Sarcoidosis 5. Coronary artery disease    Disposition: Luis [name] is in clinical remission from colon cancer.  We will follow-up on the CEA from today.  He will return for an office visit and CEA in 3 months.  15 minutes were spent with the patient today.  The majority of the time was used for counseling and coordination of care.  Betsy Coder, MD  11/25/2018  9:44 AM

## 2018-11-25 NOTE — Telephone Encounter (Signed)
Scheduled appt per los. Mailed printout  °

## 2018-11-25 NOTE — Patient Instructions (Signed)
Please provide a copy of your Medical Advanced Directive/Healthcare POA and Living Will  at your next appointment to have scanned into chart.

## 2018-11-26 ENCOUNTER — Telehealth: Payer: Self-pay | Admitting: *Deleted

## 2018-11-26 ENCOUNTER — Telehealth: Payer: Self-pay

## 2018-11-26 DIAGNOSIS — C180 Malignant neoplasm of cecum: Secondary | ICD-10-CM

## 2018-11-26 NOTE — Telephone Encounter (Signed)
-----   Message from Ladell Pier, MD sent at 11/26/2018 11:57 AM EDT ----- Please call patient, CEA is just above the upper normal range, likely unrelated to cancer, repeat in 2 months, will schedule CT scans if higher

## 2018-11-26 NOTE — Telephone Encounter (Signed)
Called patient per Dr. Benay Spice to let him know about recent CEA results. No answer. Left voicemail for patient to please return call to [phone].

## 2018-11-26 NOTE — Telephone Encounter (Signed)
Informed of CEA result and MD wants to recheck in 2 months. He agrees.

## 2018-11-29 ENCOUNTER — Telehealth: Payer: Self-pay | Admitting: Nurse Practitioner

## 2018-11-29 NOTE — Telephone Encounter (Signed)
Spoke with patient re 8/14 lab

## 2018-12-24 ENCOUNTER — Other Ambulatory Visit: Payer: Self-pay | Admitting: Interventional Cardiology

## 2018-12-24 ENCOUNTER — Telehealth: Payer: Self-pay | Admitting: Interventional Cardiology

## 2018-12-24 NOTE — Telephone Encounter (Signed)
° °  Pt c/o Shortness Of Breath: STAT if SOB developed within the last 24 hours or pt is noticeably SOB on the phone  1. Are you currently SOB (can you hear that pt is SOB on the phone)? no  2. How long have you been experiencing SOB? A week   3. Are you SOB when sitting or when up moving around? Up and moving; upon exertion   4. Are you currently experiencing any other symptoms? occasional pain in left arm. Pain near carotid artery

## 2018-12-24 NOTE — Telephone Encounter (Signed)
Called and spoke to patient. He states that for the past week he has had increased SOB on exertion. He states that he has also had intermittent episodes of pain in his left arm and "carotid artery" on the right side. He states that his Sx are relieved with rest. He denies chest pain, lightheadedness, dizziness, LEE, weight gain, or any other Sx. He states that these are the Sx when he had his blockage before. Patient denies NTG use. Appointment scheduled with Ermalinda Barrios, PA tomorrow at 12:00 PM. Patient answers no to all screening questions below. Patient understands that there are no visitors allowed. Patient understands not to arrive more than 15 minutes prior to the scheduled appointment time and that a mask will be required for the visit.       COVID-19 Pre-Screening Questions:  . In the past 7 to 10 days have you had a cough,  shortness of breath, headache, congestion, fever (100 or greater) body aches, chills, sore throat, or sudden loss of taste or sense of smell? NO . Have you been around anyone with known Covid 19? NO . Have you been around anyone who is awaiting Covid 19 test results in the past 7 to 10 days? NO . Have you been around anyone who has been exposed to Covid 19, or has mentioned symptoms of Covid 19 within the past 7 to 10 days? NO

## 2018-12-25 ENCOUNTER — Ambulatory Visit (INDEPENDENT_AMBULATORY_CARE_PROVIDER_SITE_OTHER): Payer: Medicare Other | Admitting: Physician Assistant

## 2018-12-25 ENCOUNTER — Other Ambulatory Visit: Payer: Self-pay

## 2018-12-25 ENCOUNTER — Encounter: Payer: Self-pay | Admitting: Physician Assistant

## 2018-12-25 VITALS — BP 140/72 | HR 60 | Ht 71.0 in | Wt 235.8 lb

## 2018-12-25 DIAGNOSIS — I6521 Occlusion and stenosis of right carotid artery: Secondary | ICD-10-CM | POA: Diagnosis not present

## 2018-12-25 DIAGNOSIS — R0609 Other forms of dyspnea: Secondary | ICD-10-CM

## 2018-12-25 DIAGNOSIS — I10 Essential (primary) hypertension: Secondary | ICD-10-CM | POA: Diagnosis not present

## 2018-12-25 DIAGNOSIS — I25810 Atherosclerosis of coronary artery bypass graft(s) without angina pectoris: Secondary | ICD-10-CM | POA: Diagnosis not present

## 2018-12-25 DIAGNOSIS — E785 Hyperlipidemia, unspecified: Secondary | ICD-10-CM

## 2018-12-25 NOTE — Patient Instructions (Signed)
Medication Instructions:  Your physician recommends that you continue on your current medications as directed. Please refer to the Current Medication list given to you today.  If you need a refill on your cardiac medications before your next appointment, please call your pharmacy.   Lab work: TODAY: CBC, BMET  If you have labs (blood work) drawn today and your tests are completely normal, you will receive your results only by: [name] MyChart Message (if you have MyChart) OR . A paper copy in the mail If you have any lab test that is abnormal or we need to change your treatment, we will call you to review the results.  Testing/Procedures: Your physician has requested that you have a cardiac catheterization. Cardiac catheterization is used to diagnose and/or treat various heart conditions. Doctors may recommend this procedure for a number of different reasons. The most common reason is to evaluate chest pain. Chest pain can be a symptom of coronary artery disease (CAD), and cardiac catheterization can show whether plaque is narrowing or blocking your heart's arteries. This procedure is also used to evaluate the valves, as well as measure the blood flow and oxygen levels in different parts of your heart. For further information please visit [email or web address]. Please follow instruction sheet, as given.   Follow-Up: Follow up with Luis Barrios, PA on 01/21/19 at 11:00 AM  Any Other Special Instructions Will Be Listed Below (If Applicable).     [facility] OFFICE [place], [address] [place] [address] Dept: [phone] Loc: [phone]  Luis [name]  12/25/2018  You are scheduled for a Cardiac Catheterization on Thursday, July 23 with Dr. Larae Grooms.  1. Please arrive at the [facility] (Main Entrance A) at [facility]: [address] [place], [place] [address] at 5:30 AM (This time is  two hours before your procedure to ensure your preparation). Free valet parking service is available.   Special note: Every effort is made to have your procedure done on time. Please understand that emergencies sometimes delay scheduled procedures.  2. Diet: Do not eat solid foods after midnight.  The patient may have clear liquids until 5am upon the day of the procedure.  3. Labs: TODAY: CBC, BMET  Your Pre-procedure COVID-19 Testing will be done on 12/30/18 at 3:20 PM at [facility] at [address] [place], [place] [address] After your swab you will be given a mask to wear and instructed to go home and quarantine/no visitors until after your procedure. If you test positive you will be notified and your procedure will be cancelled.    4. Medication instructions in preparation for your procedure:   Contrast Allergy: No  On the morning of your procedure, take your Aspirin and Plavix/Clopidogrel and any of your regular morning medicines.  You may use sips of water.  5. Plan for one night stay--bring personal belongings. 6. Bring a current list of your medications and current insurance cards. 7. You MUST have a responsible person to drive you home. 8. Someone MUST be with you the first 24 hours after you arrive home or your discharge will be delayed. 9. Please wear clothes that are easy to get on and off and wear slip-on shoes.  Thank you for allowing [place] to care for you!   -- [facility]

## 2018-12-25 NOTE — H&P (View-Only) (Signed)
Cardiology Office Note    Date:  12/25/2018   ID:  Luis [name], DOB [birth year], MRN [id number]  PCP:  Isaias Sakai, DO  Cardiologist: Larae Grooms, MD EPS: None  Chief Complaint  Patient presents with  . Shortness of Breath    History of Present Illness:  Luis [name] is a 78 y.o. male with history of CAD status post DES to the diagonal 08/2014 ramus intermedius was totally occluded at the time, eventually underwent CTO PCI with overlapping DES x2 to the ramus 02/2015.  Chest pain 05/2016 relieved with nitroglycerin and treated with Ranexa but has been off.  NST 12/2017 normal LVEF normal study.  Found to have a totally occluded right carotid on Dopplers 04/2018 but other vessels open. HTN, HLD, Also has adenocarcinoma of the colon status post colectomy.  Patient added onto my schedule for increased dyspnea on exertion and left arm pain relieved with rest. Was walking 3 miles daily but has cut back to 1 1/2 miles daily because of shortness of breath. Was camping last weekend and after carrying a propane tank he was very short of breath and extremely tired.occassionally gets left arm pain when he has dyspnea on exertion. No chest tightness. Similar to when he had his stents placed. Didn't have chest tightness then. Has gained 10-12 lbs.   Past Medical History:  Diagnosis Date  . Adenocarcinoma of cecum ([place]) 06/21/2017  . Arthritis    "mid back; hands; knees" (02/24/2015)  . Basal cell carcinoma    left shoulder; mid chest; right eyelid (02/24/2015)  . CAD (coronary artery disease)    a. 08/2014 Cath/PCI: LM nl, LAD 30p, D1 95 (2.25x12 Resolute Integrity DES), LCX small, RI 100 (attempted PCI) - branches fill via L->L collats, RCA dominant, nl, RPDA/PLA nl, EF 60^. b. 02/24/2015 PCI CTO of Ramus DES x2.  . Chronic bronchitis (HCC)    hx  . Diverticulosis 05/2005  . Elevated lipase   . Fuchs' corneal dystrophy   . History of adenomatous polyp of colon 05/2005   8  mm adenoma  . History of blood transfusion    "related to some of my surgeries"  . Hyperlipidemia   . Hypertension   . Iron deficiency anemia due to chronic blood loss 06/21/2017  . Prostate cancer ([place]) 2011   S/P seed implant  . Sarcoidosis   . Thrombocytopenia ([place])    a. Noted on prior labs, unclear of what w/u done.  . Trifascicular block     Past Surgical History:  Procedure Laterality Date  . BASAL CELL CARCINOMA EXCISION Left    shoulder  . CARDIAC CATHETERIZATION N/A 02/24/2015   Procedure: Coronary/Bypass Graft CTO Intervention;  Surgeon: Jettie Booze, MD;  Location: [facility];  Service: Cardiovascular;  Laterality: N/A;  . CARDIAC CATHETERIZATION  02/24/2015   Procedure: Coronary/Graft Atherectomy;  Surgeon: Jettie Booze, MD;  Location: [facility];  Service: Cardiovascular;;  . CATARACT EXTRACTION W/ INTRAOCULAR LENS  IMPLANT, BILATERAL Bilateral ~ 2011-2012  . COLONOSCOPY W/ POLYPECTOMY  06/08/2005 and 10/18/2010   8 mm adenoma 2006, none 2012. Diverticulosis and internal hemorrhoids.  . CORNEAL TRANSPLANT Bilateral ~ 2011-2012   "@ same time as cataract OR"  . CORONARY ANGIOPLASTY WITH STENT PLACEMENT  08/2014; 02/24/2015   "1 stent + 1 stent"  . EYE SURGERY    . FINGER SURGERY Right 2014   "reattached middle finger"  . INSERTION PROSTATE RADIATION SEED  ~ 2011  .  LAPAROSCOPIC CHOLECYSTECTOMY  2015  . LAPAROSCOPIC PARTIAL COLECTOMY N/A 07/26/2017   Procedure: LAPAROSCOPIC PARTIAL COLECTOMY, EXCISION SCROTAL CYST;  Surgeon: Greer Pickerel, MD;  Location: [facility];  Service: General;  Laterality: N/A;  . LEFT HEART CATHETERIZATION WITH CORONARY ANGIOGRAM N/A 08/12/2014   Procedure: LEFT HEART CATHETERIZATION WITH CORONARY ANGIOGRAM;  Surgeon: Blane Ohara, MD;  Location: [facility] C[facility] LAB;  Service: Cardiovascular;  Laterality: N/A;  . LYMPH NODE BIOPSY     mid chest  . MOHS SURGERY Right ~ 2011   "eyelid; for basal cell"  . THOROCOTOMY WITH  LOBECTOMY Right ~ 1991   partial removal right lung  . TONSILLECTOMY  ~ 1950    Current Medications: Current Meds  Medication Sig  . aspirin EC 81 MG tablet Take 81 mg by mouth daily.  . Cholecalciferol (VITAMIN D3) 2000 units capsule Take 2,000 Units by mouth daily.   . clopidogrel (PLAVIX) 75 MG tablet TAKE 1 TABLET (75 MG TOTAL) BY MOUTH DAILY WITH BREAKFAST.  . metoprolol tartrate (LOPRESSOR) 25 MG tablet TAKE 0.5 TABLETS BY MOUTH TWICE A DAY  . Multiple Vitamin (MULTIVITAMIN WITH MINERALS) TABS tablet Take 1 tablet by mouth every evening.  . nitroGLYCERIN (NITROSTAT) 0.4 MG SL tablet Place 1 tablet (0.4 mg total) under the tongue every 5 (five) minutes as needed for chest pain. X 3 doses  . OVER THE COUNTER MEDICATION Take 12 drops by mouth 2 (two) times daily. CBD oil  . rosuvastatin (CRESTOR) 20 MG tablet TAKE 1 TABLET BY MOUTH EVERY DAY  . sildenafil (REVATIO) 20 MG tablet Take 60 mg by mouth as needed (for ED).   [name] silodosin (RAPAFLO) 8 MG CAPS capsule Take 8 mg by mouth daily as needed (for urination).   . traMADol (ULTRAM) 50 MG tablet Take 50-100 mg by mouth 4 (four) times daily as needed for moderate pain.   [name] triamcinolone cream (KENALOG) 0.5 % as needed for rash. APPLY TOPICALLY 2-3 TIMES A DAY TO SCAR FOR 2-3 WEEKS THEN AS NEEDED     Allergies:   Patient has no known allergies.   Social History   Socioeconomic History  . Marital status: Married    Spouse name: Not on file  . Number of children: 2  . Years of education: Not on file  . Highest education level: Not on file  Occupational History  . Occupation: Unemployed  Social Needs  . Financial resource strain: Not on file  . Food insecurity    Worry: Not on file    Inability: Not on file  . Transportation needs    Medical: Not on file    Non-medical: Not on file  Tobacco Use  . Smoking status: Former Smoker    Packs/day: 0.00    Years: 59.00    Pack years: 0.00    Types: Cigars  . Smokeless tobacco:  Never Used  . Tobacco comment: 02/24/2015 "quit cigarettes ~ 2000 ago but smokes cigars ionce/month"  Substance and Sexual Activity  . Alcohol use: Yes    Alcohol/week: 2.0 standard drinks    Types: 1 Glasses of wine, 1 Standard drinks or equivalent per week    Comment: Drinks maybe 1-2 drinks a week or less  . Drug use: No  . Sexual activity: Not on file  Lifestyle  . Physical activity    Days per week: Not on file    Minutes per session: Not on file  . Stress: Not on file  Relationships  . Social connections  Talks on phone: Not on file    Gets together: Not on file    Attends religious service: Not on file    Active member of club or organization: Not on file    Attends meetings of clubs or organizations: Not on file    Relationship status: Not on file  Other Topics Concern  . Not on file  Social History Narrative   Lives at home wife.       Family History:  The patient's   family history includes Coronary artery disease (age of onset: 21) in his sister; Heart attack in his sister; Heart disease (age of onset: 68) in his father.   ROS:   Please see the history of present illness.    ROS All other systems reviewed and are negative.   PHYSICAL EXAM:   VS:  BP 140/72   Pulse 60   Ht 5\' 11"  (1.803 m)   Wt 235 lb 12.8 oz (107 kg)   SpO2 97%   BMI 32.89 kg/m   Physical Exam  GEN: Obese, in no acute distress  Neck: no JVD, carotid bruits, or masses Cardiac:RRR; no murmurs, rubs, or gallops  Respiratory:  clear to auscultation bilaterally, normal work of breathing GI: soft, nontender, nondistended, + BS Ext: without cyanosis, clubbing, or edema, Good distal pulses bilaterally Neuro:  Alert and Oriented x 3 Psych: euthymic mood, full affect  Wt Readings from Last 3 Encounters:  12/25/18 235 lb 12.8 oz (107 kg)  11/25/18 235 lb 12.8 oz (107 kg)  07/26/18 234 lb (106.1 kg)      Studies/Labs Reviewed:   EKG:  EKG is  ordered today.  The ekg ordered today  demonstrates  NSR with RBBB and LAFB  Recent Labs: No results found for requested labs within last 8760 hours.   Lipid Panel    Component Value Date/Time   CHOL 155 10/13/2015 1005   TRIG 186 (H) 10/13/2015 1005   HDL 49 10/13/2015 1005   CHOLHDL 3.2 10/13/2015 1005   VLDL 37 (H) 10/13/2015 1005   LDLCALC 69 10/13/2015 1005    Additional studies/ records that were reviewed today include:   Carotid Dopplers 11/2019Summary:   Technically challenging examination   Right Carotid: Velocities in the right ICA are consistent with a suspected total                occlusion. The CCA appears occluded. Bulky plaque visualized and                no flow was able to be detected in these segments.   Left Carotid: Velocities in the left ICA are consistent with a 1-39% stenosis.   Vertebrals:  Bilateral vertebral arteries demonstrate antegrade flow. Subclavians: Normal flow hemodynamics were seen in bilateral subclavian              arteries.       Stress test in July 2019 showed:  The left ventricular ejection fraction is normal (55-65%).  Nuclear stress EF: 62%.  No T wave inversion was noted during stress.  There was no ST segment deviation noted during stress.  This is a low risk study.  The study is normal.   Normal perfusion. LVEF 62% with normal wall motion. This is a low risk study.  Cath 2016  Ramus lesion, 100% stenosed. There is a 0% residual stenosis post intervention with overlapping Synergy drug-eluting stents. 2.5 x 38 mm and 3.0 x 12 mm.   Continue dual anti-platelet  therapy for at least a year and likely longer given the length of this occlusion.  He'll need aggressive secondary prevention as well. He'll be watched overnight.     ASSESSMENT:    1. Dyspnea on exertion   2. Coronary artery disease involving coronary bypass graft of native heart, angina presence unspecified   3. Occlusion of right carotid artery   4. Essential hypertension   5.  Hyperlipidemia, unspecified hyperlipidemia type      PLAN:  In order of problems listed above:  Dyspnea on exertion worsened over the past month and similar to his symptoms with prior stent. Patient does not want to have a stress test because does not feel like it showed anything in the past although the only 2 stress tests were in 2013 and last year.  He prefers to have a cardiac catheterization.  Does not want to start any new meds but offered him Ranexa.  Discussed with Dr. Johnsie Cancel who concurs that we can proceed with cardiac catheterization. I have reviewed the risks, indications, and alternatives to angioplasty and stenting with the patient. Risks include but are not limited to bleeding, infection, vascular injury, stroke, myocardial infection, arrhythmia, kidney injury, radiation-related injury in the case of prolonged fluoroscopy use, emergency cardiac surgery, and death. The patient understands the risks of serious complication is low (<2%) and patient agrees to proceed.     CAD status post DES to the diagonal 08/2014 ramus intermedius was totally occluded at the time, eventually underwent CTO PCI with overlapping DES x2 to the ramus 02/2015.  Chest pain 05/2016 relieved with nitroglycerin and treated with Ranexa but since stopped.  NST 12/2017 normal LVEF normal study.   Occlusion of the right carotid 04/2018 left was 1 to 39% stenosed  Essential hypertension BP stable  Hyperlipidemia managed by pcp  Covid antibody positive as part as [facility] study-no exposure or symptoms.   Medication Adjustments/Labs and Tests Ordered: Current medicines are reviewed at length with the patient today.  Concerns regarding medicines are outlined above.  Medication changes, Labs and Tests ordered today are listed in the Patient Instructions below. Patient Instructions  Medication Instructions:  Your physician recommends that you continue on your current medications as directed. Please refer to the  Current Medication list given to you today.  If you need a refill on your cardiac medications before your next appointment, please call your pharmacy.   Lab work: TODAY: CBC, BMET  If you have labs (blood work) drawn today and your tests are completely normal, you will receive your results only by: [name] MyChart Message (if you have MyChart) OR . A paper copy in the mail If you have any lab test that is abnormal or we need to change your treatment, we will call you to review the results.  Testing/Procedures: Your physician has requested that you have a cardiac catheterization. Cardiac catheterization is used to diagnose and/or treat various heart conditions. Doctors may recommend this procedure for a number of different reasons. The most common reason is to evaluate chest pain. Chest pain can be a symptom of coronary artery disease (CAD), and cardiac catheterization can show whether plaque is narrowing or blocking your heart's arteries. This procedure is also used to evaluate the valves, as well as measure the blood flow and oxygen levels in different parts of your heart. For further information please visit [email or web address]. Please follow instruction sheet, as given.   Follow-Up: Follow up with Ermalinda Barrios, PA on 01/21/19 at 11:00 AM  Any Other Special Instructions Will Be Listed Below (If Applicable).     [facility] OFFICE [place], [place] [address] Dept: [phone] Loc: [phone]  KHARSON RASMUSSON  12/25/2018  You are scheduled for a Cardiac Catheterization on Thursday, July 23 with Dr. Larae Grooms.  1. Please arrive at the [facility] (Main Entrance A) at [facility]: [address] [place], [place] [address] at 5:30 AM (This time is two hours before your procedure to ensure your preparation). Free valet parking service is available.   Special note: Every effort  is made to have your procedure done on time. Please understand that emergencies sometimes delay scheduled procedures.  2. Diet: Do not eat solid foods after midnight.  The patient may have clear liquids until 5am upon the day of the procedure.  3. Labs: TODAY: CBC, BMET  Your Pre-procedure COVID-19 Testing will be done on 12/30/18 at 3:20 PM at [facility] at [address] [place], [place] [address] After your swab you will be given a mask to wear and instructed to go home and quarantine/no visitors until after your procedure. If you test positive you will be notified and your procedure will be cancelled.    4. Medication instructions in preparation for your procedure:   Contrast Allergy: No  On the morning of your procedure, take your Aspirin and Plavix/Clopidogrel and any of your regular morning medicines.  You may use sips of water.  5. Plan for one night stay--bring personal belongings. 6. Bring a current list of your medications and current insurance cards. 7. You MUST have a responsible person to drive you home. 8. Someone MUST be with you the first 24 hours after you arrive home or your discharge will be delayed. 9. Please wear clothes that are easy to get on and off and wear slip-on shoes.  Thank you for allowing [place] to care for you!   -- [facility] Invasive Cardiovascular services      Signed, Ermalinda Barrios, PA-C  12/25/2018 1:09 PM    [address] [place], [place], [place]  [address] Phone: [phone]; Fax: [phone]

## 2018-12-25 NOTE — Progress Notes (Signed)
Cardiology Office Note    Date:  12/25/2018   ID:  Luis [name], DOB 1940-06-30, MRN [id number]  PCP:  Isaias Sakai, DO  Cardiologist: Larae Grooms, MD EPS: None  Chief Complaint  Patient presents with  . Shortness of Breath    History of Present Illness:  Luis [name] is a 78 y.o. male with history of CAD status post DES to the diagonal 08/2014 ramus intermedius was totally occluded at the time, eventually underwent CTO PCI with overlapping DES x2 to the ramus 02/2015.  Chest pain 05/2016 relieved with nitroglycerin and treated with Ranexa but has been off.  NST 12/2017 normal LVEF normal study.  Found to have a totally occluded right carotid on Dopplers 04/2018 but other vessels open. HTN, HLD, Also has adenocarcinoma of the colon status post colectomy.  Patient added onto my schedule for increased dyspnea on exertion and left arm pain relieved with rest. Was walking 3 miles daily but has cut back to 1 1/2 miles daily because of shortness of breath. Was camping last weekend and after carrying a propane tank he was very short of breath and extremely tired.occassionally gets left arm pain when he has dyspnea on exertion. No chest tightness. Similar to when he had his stents placed. Didn't have chest tightness then. Has gained 10-12 lbs.   Past Medical History:  Diagnosis Date  . Adenocarcinoma of cecum ([place]) 06/21/2017  . Arthritis    "mid back; hands; knees" (02/24/2015)  . Basal cell carcinoma    left shoulder; mid chest; right eyelid (02/24/2015)  . CAD (coronary artery disease)    a. 08/2014 Cath/PCI: LM nl, LAD 30p, D1 95 (2.25x12 Resolute Integrity DES), LCX small, RI 100 (attempted PCI) - branches fill via L->L collats, RCA dominant, nl, RPDA/PLA nl, EF 60^. b. 02/24/2015 PCI CTO of Ramus DES x2.  . Chronic bronchitis (HCC)    hx  . Diverticulosis 05/2005  . Elevated lipase   . Fuchs' corneal dystrophy   . History of adenomatous polyp of colon 05/2005   8  mm adenoma  . History of blood transfusion    "related to some of my surgeries"  . Hyperlipidemia   . Hypertension   . Iron deficiency anemia due to chronic blood loss 06/21/2017  . Prostate cancer ([place]) 2011   S/P seed implant  . Sarcoidosis   . Thrombocytopenia ([place])    a. Noted on prior labs, unclear of what w/u done.  . Trifascicular block     Past Surgical History:  Procedure Laterality Date  . BASAL CELL CARCINOMA EXCISION Left    shoulder  . CARDIAC CATHETERIZATION N/A 02/24/2015   Procedure: Coronary/Bypass Graft CTO Intervention;  Surgeon: Jettie Booze, MD;  Location: [facility];  Service: Cardiovascular;  Laterality: N/A;  . CARDIAC CATHETERIZATION  02/24/2015   Procedure: Coronary/Graft Atherectomy;  Surgeon: Jettie Booze, MD;  Location: [facility];  Service: Cardiovascular;;  . CATARACT EXTRACTION W/ INTRAOCULAR LENS  IMPLANT, BILATERAL Bilateral ~ 2011-2012  . COLONOSCOPY W/ POLYPECTOMY  06/08/2005 and 10/18/2010   8 mm adenoma 2006, none 2012. Diverticulosis and internal hemorrhoids.  . CORNEAL TRANSPLANT Bilateral ~ 2011-2012   "@ same time as cataract OR"  . CORONARY ANGIOPLASTY WITH STENT PLACEMENT  08/2014; 02/24/2015   "1 stent + 1 stent"  . EYE SURGERY    . FINGER SURGERY Right 2014   "reattached middle finger"  . INSERTION PROSTATE RADIATION SEED  ~ 2011  .  LAPAROSCOPIC CHOLECYSTECTOMY  2015  . LAPAROSCOPIC PARTIAL COLECTOMY N/A 07/26/2017   Procedure: LAPAROSCOPIC PARTIAL COLECTOMY, EXCISION SCROTAL CYST;  Surgeon: Greer Pickerel, MD;  Location: [facility];  Service: General;  Laterality: N/A;  . LEFT HEART CATHETERIZATION WITH CORONARY ANGIOGRAM N/A 08/12/2014   Procedure: LEFT HEART CATHETERIZATION WITH CORONARY ANGIOGRAM;  Surgeon: Blane Ohara, MD;  Location: [facility] C[facility];  Service: Cardiovascular;  Laterality: N/A;  . LYMPH NODE BIOPSY     mid chest  . MOHS SURGERY Right ~ 2011   "eyelid; for basal cell"  . THOROCOTOMY WITH  LOBECTOMY Right ~ 1991   partial removal right lung  . TONSILLECTOMY  ~ 1950    Current Medications: Current Meds  Medication Sig  . aspirin EC 81 MG tablet Take 81 mg by mouth daily.  . Cholecalciferol (VITAMIN D3) 2000 units capsule Take 2,000 Units by mouth daily.   . clopidogrel (PLAVIX) 75 MG tablet TAKE 1 TABLET (75 MG TOTAL) BY MOUTH DAILY WITH BREAKFAST.  . metoprolol tartrate (LOPRESSOR) 25 MG tablet TAKE 0.5 TABLETS BY MOUTH TWICE A DAY  . Multiple Vitamin (MULTIVITAMIN WITH MINERALS) TABS tablet Take 1 tablet by mouth every evening.  . nitroGLYCERIN (NITROSTAT) 0.4 MG SL tablet Place 1 tablet (0.4 mg total) under the tongue every 5 (five) minutes as needed for chest pain. X 3 doses  . OVER THE COUNTER MEDICATION Take 12 drops by mouth 2 (two) times daily. CBD oil  . rosuvastatin (CRESTOR) 20 MG tablet TAKE 1 TABLET BY MOUTH EVERY DAY  . sildenafil (REVATIO) 20 MG tablet Take 60 mg by mouth as needed (for ED).   [name] silodosin (RAPAFLO) 8 MG CAPS capsule Take 8 mg by mouth daily as needed (for urination).   . traMADol (ULTRAM) 50 MG tablet Take 50-100 mg by mouth 4 (four) times daily as needed for moderate pain.   [name] triamcinolone cream (KENALOG) 0.5 % as needed for rash. APPLY TOPICALLY 2-3 TIMES A DAY TO SCAR FOR 2-3 WEEKS THEN AS NEEDED     Allergies:   Patient has no known allergies.   Social History   Socioeconomic History  . Marital status: Married    Spouse name: Not on file  . Number of children: 2  . Years of education: Not on file  . Highest education level: Not on file  Occupational History  . Occupation: Unemployed  Social Needs  . Financial resource strain: Not on file  . Food insecurity    Worry: Not on file    Inability: Not on file  . Transportation needs    Medical: Not on file    Non-medical: Not on file  Tobacco Use  . Smoking status: Former Smoker    Packs/day: 0.00    Years: 59.00    Pack years: 0.00    Types: Cigars  . Smokeless tobacco:  Never Used  . Tobacco comment: 02/24/2015 "quit cigarettes ~ 2000 ago but smokes cigars ionce/month"  Substance and Sexual Activity  . Alcohol use: Yes    Alcohol/week: 2.0 standard drinks    Types: 1 Glasses of wine, 1 Standard drinks or equivalent per week    Comment: Drinks maybe 1-2 drinks a week or less  . Drug use: No  . Sexual activity: Not on file  Lifestyle  . Physical activity    Days per week: Not on file    Minutes per session: Not on file  . Stress: Not on file  Relationships  . Social connections  Talks on phone: Not on file    Gets together: Not on file    Attends religious service: Not on file    Active member of club or organization: Not on file    Attends meetings of clubs or organizations: Not on file    Relationship status: Not on file  Other Topics Concern  . Not on file  Social History Narrative   Lives at home wife.       Family History:  The patient's   family history includes Coronary artery disease (age of onset: 83) in his sister; Heart attack in his sister; Heart disease (age of onset: 46) in his father.   ROS:   Please see the history of present illness.    ROS All other systems reviewed and are negative.   PHYSICAL EXAM:   VS:  BP 140/72   Pulse 60   Ht 5\' 11"  (1.803 m)   Wt 235 lb 12.8 oz (107 kg)   SpO2 97%   BMI 32.89 kg/m   Physical Exam  GEN: Obese, in no acute distress  Neck: no JVD, carotid bruits, or masses Cardiac:RRR; no murmurs, rubs, or gallops  Respiratory:  clear to auscultation bilaterally, normal work of breathing GI: soft, nontender, nondistended, + BS Ext: without cyanosis, clubbing, or edema, Good distal pulses bilaterally Neuro:  Alert and Oriented x 3 Psych: euthymic mood, full affect  Wt Readings from Last 3 Encounters:  12/25/18 235 lb 12.8 oz (107 kg)  11/25/18 235 lb 12.8 oz (107 kg)  07/26/18 234 lb (106.1 kg)      Studies/Labs Reviewed:   EKG:  EKG is  ordered today.  The ekg ordered today  demonstrates  NSR with RBBB and LAFB  Recent Labs: No results found for requested labs within last 8760 hours.   Lipid Panel    Component Value Date/Time   CHOL 155 10/13/2015 1005   TRIG 186 (H) 10/13/2015 1005   HDL 49 10/13/2015 1005   CHOLHDL 3.2 10/13/2015 1005   VLDL 37 (H) 10/13/2015 1005   LDLCALC 69 10/13/2015 1005    Additional studies/ records that were reviewed today include:   Carotid Dopplers 11/2019Summary:   Technically challenging examination   Right Carotid: Velocities in the right ICA are consistent with a suspected total                occlusion. The CCA appears occluded. Bulky plaque visualized and                no flow was able to be detected in these segments.   Left Carotid: Velocities in the left ICA are consistent with a 1-39% stenosis.   Vertebrals:  Bilateral vertebral arteries demonstrate antegrade flow. Subclavians: Normal flow hemodynamics were seen in bilateral subclavian              arteries.       Stress test in July 2019 showed:  The left ventricular ejection fraction is normal (55-65%).  Nuclear stress EF: 62%.  No T wave inversion was noted during stress.  There was no ST segment deviation noted during stress.  This is a low risk study.  The study is normal.   Normal perfusion. LVEF 62% with normal wall motion. This is a low risk study.  Cath 2016  Ramus lesion, 100% stenosed. There is a 0% residual stenosis post intervention with overlapping Synergy drug-eluting stents. 2.5 x 38 mm and 3.0 x 12 mm.   Continue dual anti-platelet  therapy for at least a year and likely longer given the length of this occlusion.  He'll need aggressive secondary prevention as well. He'll be watched overnight.     ASSESSMENT:    1. Dyspnea on exertion   2. Coronary artery disease involving coronary bypass graft of native heart, angina presence unspecified   3. Occlusion of right carotid artery   4. Essential hypertension   5.  Hyperlipidemia, unspecified hyperlipidemia type      PLAN:  In order of problems listed above:  Dyspnea on exertion worsened over the past month and similar to his symptoms with prior stent. Patient does not want to have a stress test because does not feel like it showed anything in the past although the only 2 stress tests were in 2013 and last year.  He prefers to have a cardiac catheterization.  Does not want to start any new meds but offered him Ranexa.  Discussed with Dr. Johnsie Cancel who concurs that we can proceed with cardiac catheterization. I have reviewed the risks, indications, and alternatives to angioplasty and stenting with the patient. Risks include but are not limited to bleeding, infection, vascular injury, stroke, myocardial infection, arrhythmia, kidney injury, radiation-related injury in the case of prolonged fluoroscopy use, emergency cardiac surgery, and death. The patient understands the risks of serious complication is low (<0%) and patient agrees to proceed.     CAD status post DES to the diagonal 08/2014 ramus intermedius was totally occluded at the time, eventually underwent CTO PCI with overlapping DES x2 to the ramus 02/2015.  Chest pain 05/2016 relieved with nitroglycerin and treated with Ranexa but since stopped.  NST 12/2017 normal LVEF normal study.   Occlusion of the right carotid 04/2018 left was 1 to 39% stenosed  Essential hypertension BP stable  Hyperlipidemia managed by pcp  Covid antibody positive as part as [facility] study-no exposure or symptoms.   Medication Adjustments/Labs and Tests Ordered: Current medicines are reviewed at length with the patient today.  Concerns regarding medicines are outlined above.  Medication changes, Labs and Tests ordered today are listed in the Patient Instructions below. Patient Instructions  Medication Instructions:  Your physician recommends that you continue on your current medications as directed. Please refer to the  Current Medication list given to you today.  If you need a refill on your cardiac medications before your next appointment, please call your pharmacy.   Lab work: TODAY: CBC, BMET  If you have labs (blood work) drawn today and your tests are completely normal, you will receive your results only by: [name] MyChart Message (if you have MyChart) OR . A paper copy in the mail If you have any lab test that is abnormal or we need to change your treatment, we will call you to review the results.  Testing/Procedures: Your physician has requested that you have a cardiac catheterization. Cardiac catheterization is used to diagnose and/or treat various heart conditions. Doctors may recommend this procedure for a number of different reasons. The most common reason is to evaluate chest pain. Chest pain can be a symptom of coronary artery disease (CAD), and cardiac catheterization can show whether plaque is narrowing or blocking your heart's arteries. This procedure is also used to evaluate the valves, as well as measure the blood flow and oxygen levels in different parts of your heart. For further information please visit [facility]tn. Please follow instruction sheet, as given.   Follow-Up: Follow up with Ermalinda Barrios, PA on 01/21/19 at 11:00 AM  Any Other Special Instructions Will Be Listed Below (If Applicable).     [facility] OFFICE [place], [place] [address] Dept: [phone] Loc: [phone]  DAYN BARICH  12/25/2018  You are scheduled for a Cardiac Catheterization on Thursday, July 23 with Dr. Larae Grooms.  1. Please arrive at the [facility] (Main Entrance A) at [facility]: [address] [place], [place] [address] at 5:30 AM (This time is two hours before your procedure to ensure your preparation). Free valet parking service is available.   Special note: Every effort  is made to have your procedure done on time. Please understand that emergencies sometimes delay scheduled procedures.  2. Diet: Do not eat solid foods after midnight.  The patient may have clear liquids until 5am upon the day of the procedure.  3. Labs: TODAY: CBC, BMET  Your Pre-procedure COVID-19 Testing will be done on 12/30/18 at 3:20 PM at [facility] at [address] [place], [place] [address] After your swab you will be given a mask to wear and instructed to go home and quarantine/no visitors until after your procedure. If you test positive you will be notified and your procedure will be cancelled.    4. Medication instructions in preparation for your procedure:   Contrast Allergy: No  On the morning of your procedure, take your Aspirin and Plavix/Clopidogrel and any of your regular morning medicines.  You may use sips of water.  5. Plan for one night stay--bring personal belongings. 6. Bring a current list of your medications and current insurance cards. 7. You MUST have a responsible person to drive you home. 8. Someone MUST be with you the first 24 hours after you arrive home or your discharge will be delayed. 9. Please wear clothes that are easy to get on and off and wear slip-on shoes.  Thank you for allowing [place] to care for you!   -- [facility] Invasive Cardiovascular services      Signed, Ermalinda Barrios, PA-C  12/25/2018 1:09 PM    [place], [place], [place]  [address] Phone: [phone]; Fax: [phone]

## 2018-12-26 LAB — BASIC METABOLIC PANEL
BUN/Creatinine Ratio: 17 (ref 10–24)
BUN: 15 mg/dL (ref 8–27)
CO2: 25 mmol/L (ref 20–29)
Calcium: 9.2 mg/dL (ref 8.6–10.2)
Chloride: 99 mmol/L (ref 96–106)
Creatinine, Ser: 0.89 mg/dL (ref 0.76–1.27)
GFR calc Af Amer: 95 mL/min/{1.73_m2} (ref >59)
GFR calc non Af Amer: 82 mL/min/{1.73_m2} (ref >59)
Glucose: 84 mg/dL (ref 65–99)
Potassium: 4.8 mmol/L (ref 3.5–5.2)
Sodium: 138 mmol/L (ref 134–144)

## 2018-12-26 LAB — CBC
Hematocrit: 45.7 % (ref 37.5–51.0)
Hemoglobin: 15.5 g/dL (ref 13.0–17.7)
MCH: 31.1 pg (ref 26.6–33.0)
MCHC: 33.9 g/dL (ref 31.5–35.7)
MCV: 92 fL (ref 79–97)
Platelets: 142 10*3/uL — ABNORMAL LOW (ref 150–450)
RBC: 4.99 x10E6/uL (ref 4.14–5.80)
RDW: 14.4 % (ref 11.6–15.4)
WBC: 5.7 10*3/uL (ref 3.4–10.8)

## 2018-12-30 ENCOUNTER — Other Ambulatory Visit (HOSPITAL_COMMUNITY)
Admission: RE | Admit: 2018-12-30 | Discharge: 2018-12-30 | Disposition: A | Discharge status: Home / Self Care | Payer: Medicare Other | Source: Ambulatory Visit | Attending: Interventional Cardiology | Admitting: Interventional Cardiology

## 2018-12-30 DIAGNOSIS — Z1159 Encounter for screening for other viral diseases: Secondary | ICD-10-CM | POA: Insufficient documentation

## 2018-12-30 LAB — SARS CORONAVIRUS 2 (TAT 6-24 HRS): SARS Coronavirus 2: NEGATIVE

## 2019-01-01 ENCOUNTER — Telehealth: Payer: Self-pay | Admitting: *Deleted

## 2019-01-01 NOTE — Telephone Encounter (Signed)
Pt contacted pre-catheterization scheduled at [facility] for: Thursday January 02, 2019 7:30 AM Verified arrival time and place: [facility] Entrance A at: 5:30 AM  Covid-19 test date: 12/31/18  No solid food after midnight prior to cath, clear liquids until 5 AM day of procedure. Contrast allergy: no  Hold: Sildenafil until post procedure.  AM meds can be  taken pre-cath with sip of water including: ASA 81 mg Plavix 75 mg   Confirmed patient has responsible person to drive home post procedure and observe 24 hours after arriving home: yes  Due to Covid-19 pandemic, only one support person will be allowed with patient. Must be the same support person for that patient's entire stay. On arrival the support person will be required to wear a mask and will be screened, including having temporal temperature checked (below 100.4 to be cleared). They will be required to wait in the [facility] waiting room for the duration of the procedure. To limit exposure, MDs will continue to call support person after the procedure instead of speaking with them in consult room.   Patients are required to wear a mask when they enter the hospital.      COVID-19 Pre-Screening Questions:  . In the past 7 to 10 days have you had a cough,  shortness of breath, headache, congestion, fever (100 or greater) body aches, chills, sore throat, or sudden loss of taste or sense of smell? Shortness of breath . Have you been around anyone with known Covid 19? no . Have you been around anyone who is awaiting Covid 19 test results in the past 7 to 10 days? no . Have you been around anyone who has been exposed to Covid 19, or has mentioned symptoms of Covid 19 within the past 7 to 10 days? No  I reviewed procedure, mask, visitor instructions with patient, Covid-19 screening questions with patient, he verbalized understanding, thanked me for call.

## 2019-01-02 ENCOUNTER — Ambulatory Visit (HOSPITAL_COMMUNITY)
Admission: RE | Admit: 2019-01-02 | Discharge: 2019-01-02 | Disposition: A | Discharge status: Home / Self Care | Payer: Medicare Other | Attending: Interventional Cardiology | Admitting: Interventional Cardiology

## 2019-01-02 ENCOUNTER — Other Ambulatory Visit: Payer: Self-pay

## 2019-01-02 ENCOUNTER — Encounter (HOSPITAL_COMMUNITY)
Admission: RE | Disposition: A | Discharge status: Home / Self Care | Payer: Medicare Other | Source: Home / Self Care | Attending: Interventional Cardiology

## 2019-01-02 DIAGNOSIS — M1990 Unspecified osteoarthritis, unspecified site: Secondary | ICD-10-CM | POA: Insufficient documentation

## 2019-01-02 DIAGNOSIS — I6521 Occlusion and stenosis of right carotid artery: Secondary | ICD-10-CM | POA: Diagnosis not present

## 2019-01-02 DIAGNOSIS — I25119 Atherosclerotic heart disease of native coronary artery with unspecified angina pectoris: Secondary | ICD-10-CM | POA: Diagnosis not present

## 2019-01-02 DIAGNOSIS — Z8249 Family history of ischemic heart disease and other diseases of the circulatory system: Secondary | ICD-10-CM | POA: Insufficient documentation

## 2019-01-02 DIAGNOSIS — E785 Hyperlipidemia, unspecified: Secondary | ICD-10-CM | POA: Diagnosis not present

## 2019-01-02 DIAGNOSIS — I10 Essential (primary) hypertension: Secondary | ICD-10-CM | POA: Diagnosis not present

## 2019-01-02 DIAGNOSIS — Z947 Corneal transplant status: Secondary | ICD-10-CM | POA: Insufficient documentation

## 2019-01-02 DIAGNOSIS — Z79899 Other long term (current) drug therapy: Secondary | ICD-10-CM | POA: Insufficient documentation

## 2019-01-02 DIAGNOSIS — Z902 Acquired absence of lung [part of]: Secondary | ICD-10-CM | POA: Insufficient documentation

## 2019-01-02 DIAGNOSIS — I209 Angina pectoris, unspecified: Secondary | ICD-10-CM | POA: Diagnosis present

## 2019-01-02 DIAGNOSIS — R0609 Other forms of dyspnea: Secondary | ICD-10-CM | POA: Insufficient documentation

## 2019-01-02 DIAGNOSIS — Z955 Presence of coronary angioplasty implant and graft: Secondary | ICD-10-CM | POA: Insufficient documentation

## 2019-01-02 DIAGNOSIS — Z7982 Long term (current) use of aspirin: Secondary | ICD-10-CM | POA: Insufficient documentation

## 2019-01-02 DIAGNOSIS — I2584 Coronary atherosclerosis due to calcified coronary lesion: Secondary | ICD-10-CM | POA: Insufficient documentation

## 2019-01-02 DIAGNOSIS — F17290 Nicotine dependence, other tobacco product, uncomplicated: Secondary | ICD-10-CM | POA: Insufficient documentation

## 2019-01-02 DIAGNOSIS — R079 Chest pain, unspecified: Secondary | ICD-10-CM | POA: Diagnosis present

## 2019-01-02 HISTORY — PX: CORONARY STENT INTERVENTION: CATH118234

## 2019-01-02 HISTORY — PX: LEFT HEART CATH AND CORONARY ANGIOGRAPHY: CATH118249

## 2019-01-02 LAB — POCT ACTIVATED CLOTTING TIME: Activated Clotting Time: 329 seconds

## 2019-01-02 SURGERY — LEFT HEART CATH AND CORONARY ANGIOGRAPHY
Anesthesia: LOCAL

## 2019-01-02 MED ORDER — FENTANYL CITRATE (PF) 100 MCG/2ML IJ SOLN
INTRAMUSCULAR | Status: AC
  Filled 2019-01-02: qty 2

## 2019-01-02 MED ORDER — METOPROLOL TARTRATE 12.5 MG HALF TABLET: 12.5000 mg | ORAL_TABLET | Freq: Two times a day (BID) | ORAL | Status: DC

## 2019-01-02 MED ORDER — MIDAZOLAM HCL 2 MG/2ML IJ SOLN
INTRAMUSCULAR | Status: AC
  Filled 2019-01-02: qty 2

## 2019-01-02 MED ORDER — HEPARIN (PORCINE) IN NACL 1000-0.9 UT/500ML-% IV SOLN
INTRAVENOUS | Status: DC | PRN
  Administered 2019-01-02 (×2): 500 mL

## 2019-01-02 MED ORDER — HEPARIN SODIUM (PORCINE) 1000 UNIT/ML IJ SOLN
INTRAMUSCULAR | Status: DC | PRN
  Administered 2019-01-02: 2000 [IU] via INTRAVENOUS
  Administered 2019-01-02: 5000 [IU] via INTRAVENOUS
  Administered 2019-01-02: 7000 [IU] via INTRAVENOUS

## 2019-01-02 MED ORDER — ASPIRIN 81 MG PO CHEW: 81.0000 mg | CHEWABLE_TABLET | ORAL | Status: DC

## 2019-01-02 MED ORDER — SODIUM CHLORIDE 0.9 % WEIGHT BASED INFUSION
3.0000 mL/kg/h | INTRAVENOUS | Status: AC
  Administered 2019-01-02: 3 mL/kg/h via INTRAVENOUS

## 2019-01-02 MED ORDER — ACETAMINOPHEN 325 MG PO TABS: 650.0000 mg | ORAL_TABLET | ORAL | Status: DC | PRN

## 2019-01-02 MED ORDER — ASPIRIN EC 81 MG PO TBEC: 81.0000 mg | DELAYED_RELEASE_TABLET | Freq: Every day | ORAL | Status: DC

## 2019-01-02 MED ORDER — CLOPIDOGREL BISULFATE 75 MG PO TABS: 75.0000 mg | ORAL_TABLET | Freq: Every day | ORAL | Status: DC

## 2019-01-02 MED ORDER — SODIUM CHLORIDE 0.9 % IV SOLN: INTRAVENOUS | Status: AC

## 2019-01-02 MED ORDER — VITAMIN D3 50 MCG (2000 UT) PO CAPS: 2000.0000 [IU] | ORAL_CAPSULE | Freq: Every evening | ORAL | Status: DC

## 2019-01-02 MED ORDER — ACETAMINOPHEN 500 MG PO TABS: 500.0000 mg | ORAL_TABLET | Freq: Four times a day (QID) | ORAL | Status: DC | PRN

## 2019-01-02 MED ORDER — NITROGLYCERIN 0.4 MG SL SUBL: 0.4000 mg | SUBLINGUAL_TABLET | SUBLINGUAL | Status: DC | PRN

## 2019-01-02 MED ORDER — VERAPAMIL HCL 2.5 MG/ML IV SOLN
INTRAVENOUS | Status: DC | PRN
  Administered 2019-01-02: 10 mL via INTRA_ARTERIAL

## 2019-01-02 MED ORDER — HYDRALAZINE HCL 20 MG/ML IJ SOLN: 10.0000 mg | INTRAMUSCULAR | Status: DC | PRN

## 2019-01-02 MED ORDER — CLOPIDOGREL BISULFATE 75 MG PO TABS: 75.0000 mg | ORAL_TABLET | ORAL | Status: DC

## 2019-01-02 MED ORDER — ASPIRIN 81 MG PO CHEW: 81.0000 mg | CHEWABLE_TABLET | Freq: Every day | ORAL | Status: DC

## 2019-01-02 MED ORDER — TRAMADOL HCL 50 MG PO TABS: 100.0000 mg | ORAL_TABLET | Freq: Two times a day (BID) | ORAL | Status: DC

## 2019-01-02 MED ORDER — SODIUM CHLORIDE 0.9 % WEIGHT BASED INFUSION: 1.0000 mL/kg/h | INTRAVENOUS | Status: DC

## 2019-01-02 MED ORDER — SODIUM CHLORIDE 0.9 % IV SOLN: 250.0000 mL | INTRAVENOUS | Status: DC | PRN

## 2019-01-02 MED ORDER — SODIUM CHLORIDE 0.9% FLUSH: 3.0000 mL | INTRAVENOUS | Status: DC | PRN

## 2019-01-02 MED ORDER — FENTANYL CITRATE (PF) 100 MCG/2ML IJ SOLN
INTRAMUSCULAR | Status: DC | PRN
  Administered 2019-01-02 (×2): 25 ug via INTRAVENOUS

## 2019-01-02 MED ORDER — VERAPAMIL HCL 2.5 MG/ML IV SOLN
INTRAVENOUS | Status: AC
  Filled 2019-01-02: qty 2

## 2019-01-02 MED ORDER — SODIUM CHLORIDE 0.9% FLUSH: 3.0000 mL | Freq: Two times a day (BID) | INTRAVENOUS | Status: DC

## 2019-01-02 MED ORDER — NAPROXEN SODIUM 220 MG PO TABS: 220.0000 mg | ORAL_TABLET | Freq: Two times a day (BID) | ORAL | Status: DC | PRN

## 2019-01-02 MED ORDER — NITROGLYCERIN 1 MG/10 ML FOR IR/CATH LAB
INTRA_ARTERIAL | Status: DC | PRN
  Administered 2019-01-02: 400 ug via INTRA_ARTERIAL

## 2019-01-02 MED ORDER — SILDENAFIL CITRATE 20 MG PO TABS: 60.0000 mg | ORAL_TABLET | ORAL | Status: DC | PRN

## 2019-01-02 MED ORDER — ONDANSETRON HCL 4 MG/2ML IJ SOLN: 4.0000 mg | Freq: Four times a day (QID) | INTRAMUSCULAR | Status: DC | PRN

## 2019-01-02 MED ORDER — ROSUVASTATIN CALCIUM 20 MG PO TABS: 20.0000 mg | ORAL_TABLET | Freq: Every day | ORAL | Status: DC

## 2019-01-02 MED ORDER — MIDAZOLAM HCL 2 MG/2ML IJ SOLN
INTRAMUSCULAR | Status: DC | PRN
  Administered 2019-01-02: 1 mg via INTRAVENOUS
  Administered 2019-01-02: 2 mg via INTRAVENOUS

## 2019-01-02 MED ORDER — NITROGLYCERIN 1 MG/10 ML FOR IR/CATH LAB
INTRA_ARTERIAL | Status: AC
  Filled 2019-01-02: qty 10

## 2019-01-02 MED ORDER — HEPARIN SODIUM (PORCINE) 1000 UNIT/ML IJ SOLN
INTRAMUSCULAR | Status: AC
  Filled 2019-01-02: qty 1

## 2019-01-02 MED ORDER — IOHEXOL 350 MG/ML SOLN
INTRAVENOUS | Status: DC | PRN
  Administered 2019-01-02: 130 mL via INTRAVENOUS

## 2019-01-02 MED ORDER — LABETALOL HCL 5 MG/ML IV SOLN: 10.0000 mg | INTRAVENOUS | Status: DC | PRN

## 2019-01-02 MED ORDER — ADULT MULTIVITAMIN W/MINERALS CH: 1.0000 | ORAL_TABLET | Freq: Every evening | ORAL | Status: DC

## 2019-01-02 MED ORDER — LIDOCAINE HCL (PF) 1 % IJ SOLN
INTRAMUSCULAR | Status: DC | PRN
  Administered 2019-01-02: 2 mL

## 2019-01-02 MED ORDER — HEPARIN (PORCINE) IN NACL 1000-0.9 UT/500ML-% IV SOLN
INTRAVENOUS | Status: AC
  Filled 2019-01-02: qty 1000

## 2019-01-02 MED ORDER — LIDOCAINE HCL (PF) 1 % IJ SOLN
INTRAMUSCULAR | Status: AC
  Filled 2019-01-02: qty 30

## 2019-01-02 SURGICAL SUPPLY — 21 items
BALLN SAPPHIRE 2.0X12 (BALLOONS) ×2
BALLN SAPPHIRE 2.5X15 (BALLOONS) ×2
BALLN SAPPHIRE ~~LOC~~ 3.5X15 (BALLOONS) ×1 IMPLANT
BALLOON SAPPHIRE 2.0X12 (BALLOONS) IMPLANT
BALLOON SAPPHIRE 2.5X15 (BALLOONS) IMPLANT
BAND CMPR LRG ZPHR (HEMOSTASIS) ×1
BAND ZEPHYR COMPRESS 30 LONG (HEMOSTASIS) ×1 IMPLANT
CATH 5FR JL3.5 JR4 ANG PIG MP (CATHETERS) ×1 IMPLANT
CATH LAUNCHER 6FR EBU3.5 (CATHETERS) ×1 IMPLANT
GLIDESHEATH SLEND SS 6F .021 (SHEATH) ×1 IMPLANT
GUIDEWIRE INQWIRE 1.5J.035X260 (WIRE) IMPLANT
INQWIRE 1.5J .035X260CM (WIRE) ×2
KIT ENCORE 26 ADVANTAGE (KITS) ×2 IMPLANT
KIT HEART LEFT (KITS) ×2 IMPLANT
KIT HEMO VALVE WATCHDOG (MISCELLANEOUS) ×1 IMPLANT
PACK CARDIAC CATHETERIZATION (CUSTOM PROCEDURE TRAY) ×2 IMPLANT
STENT SYNERGY DES 3X24 (Permanent Stent) ×1 IMPLANT
TRANSDUCER W/STOPCOCK (MISCELLANEOUS) ×2 IMPLANT
TUBING CIL FLEX 10 FLL-RA (TUBING) ×2 IMPLANT
WIRE ASAHI PROWATER 180CM (WIRE) ×1 IMPLANT
WIRE HI TORQ BMW 190CM (WIRE) ×1 IMPLANT

## 2019-01-02 NOTE — Interval H&P Note (Signed)
Cath Lab Visit (complete for each Cath Lab visit)  Clinical Evaluation Leading to the Procedure:   ACS: No.  Non-ACS:    Anginal Classification: CCS III  Anti-ischemic medical therapy: Minimal Therapy (1 class of medications)  Non-Invasive Test Results: No non-invasive testing performed  Prior CABG: No previous CABG      History and Physical Interval Note:  01/02/2019 7:33 AM  Luis [name]  has presented today for surgery, with the diagnosis of angina.  The various methods of treatment have been discussed with the patient and family. After consideration of risks, benefits and other options for treatment, the patient has consented to  Procedure(s): LEFT HEART CATH AND CORONARY ANGIOGRAPHY (N/A) as a surgical intervention.  The patient's history has been reviewed, patient examined, no change in status, stable for surgery.  I have reviewed the patient's chart and labs.  Questions were answered to the patient's satisfaction.     Larae Grooms

## 2019-01-02 NOTE — Discharge Instructions (Signed)
DRINK PLENTY OF FLUIDS FOR THE NEXT 2-3 DAYS TO KEEP HYDRATED.  Radial Site Care  This sheet gives you information about how to care for yourself after your procedure. Your health care provider may also give you more specific instructions. If you have problems or questions, contact your health care provider. What can I expect after the procedure? After the procedure, it is common to have:  Bruising and tenderness at the catheter insertion area. Follow these instructions at home: Medicines  Take over-the-counter and prescription medicines only as told by your health care provider. Insertion site care  Follow instructions from your health care provider about how to take care of your insertion site. Make sure you: ? Wash your hands with soap and water before you change your bandage (dressing). If soap and water are not available, use hand sanitizer. ? Change your dressing as told by your health care provider.  Check your insertion site every day for signs of infection. Check for: ? Redness, swelling, or pain. ? Fluid or blood. ? Pus or a bad smell. ? Warmth.  Do not take baths, swim, or use a hot tub for 5 days. ? You may shower 24-48 hours after the procedure. ? Remove the dressing and gently wash the site with plain soap and water. ? Pat the area dry with a clean towel. ? Do not rub the site. That could cause bleeding.  Do not apply powder or lotion to the site. Activity   For 24 hours after the procedure, or as directed by your health care provider: ? Do not flex or bend the affected arm. ? Do not push or pull heavy objects with the affected arm. ? Do not drive yourself home from the hospital or clinic. You may drive 24 hours after the procedure unless your health care provider tells you not to. ? Do not operate machinery or power tools.  Do not lift anything that is heavier than 10 lbs for 5 days.  Ask your health care provider when it is okay to: ? Return to work or  school. ? Resume usual physical activities or sports. ? Resume sexual activity. General instructions  If the catheter site starts to bleed, raise your arm and put firm pressure on the site. If the bleeding does not stop, get help right away. This is a medical emergency.  If you went home on the same day as your procedure, a responsible adult should be with you for the first 24 hours after you arrive home.  Keep all follow-up visits as told by your health care provider. This is important. Contact a health care provider if:  You have a fever.  You have redness, swelling, or yellow drainage around your insertion site. Get help right away if:  You have unusual pain at the radial site.  The catheter insertion area swells very fast.  The insertion area is bleeding, and the bleeding does not stop when you hold steady pressure on the area.  Your arm or hand becomes pale, cool, tingly, or numb. These symptoms may represent a serious problem that is an emergency. Do not wait to see if the symptoms will go away. Get medical help right away. Call your local emergency services (911 in the [place] Do not drive yourself to the hospital. Summary  After the procedure, it is common to have bruising and tenderness at the site.  Follow instructions from your health care provider about how to take care of your radial site wound.  Check the wound every day for signs of infection.  Do not lift anything that is heavier than 10 lb (4.5 kg), or the limit that you are told, until your health care provider says that it is safe. This information is not intended to replace advice given to you by your health care provider. Make sure you discuss any questions you have with your health care provider. Document Released: 07/01/2010 Document Revised: 07/04/2017 Document Reviewed: 07/04/2017 Elsevier Patient Education  2020 [employer].

## 2019-01-02 NOTE — Progress Notes (Signed)
CARDIAC REHAB PHASE I   Stent education completed with pt. Pt educated on importance of ASA, Plavix, and NTG. Pt given heart healthy diet. Reviewed restrictions, site care, and exercise guidelines. Will refer to CRP II [place]. Pt very knowledgeable on heart care.  [id number] Rufina Falco, RN BSN 01/02/2019 11:24 AM

## 2019-01-02 NOTE — Discharge Summary (Addendum)
Discharge Summary/SAME DAY PCI    Patient ID: Luis [name],  MRN: [id number], DOB/AGE: [birth year] 78 y.o.  Admit date: 01/02/2019 Discharge date: 01/02/2019  Primary Care Provider: Isaias Sakai Primary Cardiologist: Dr. Irish Lack   Discharge Diagnoses    Active Problems:   Angina pectoris [facility])   Allergies No Known Allergies  Diagnostic Studies/Procedures    Cath: 01/02/19   Patent diagonal stent.  Previously placed Ramus drug eluting stent is widely patent.  Prox LAD lesion is 75% stenosed.  A drug-eluting stent was successfully placed using a STENT SYNERGY DES 3X24.  Post intervention, there is a 0% residual stenosis.  1st Diag-1 lesion is 75% stenosed. Balloon angioplasty was performed using a BALLOON SAPPHIRE 2.0X12.[name]  Post intervention, there is a 40% residual stenosis.  The left ventricular systolic function is normal.  LV end diastolic pressure is mildly elevated. LVEDP 15 mm Hg.  The left ventricular ejection fraction is 55-65% by visual estimate.  There is no aortic valve stenosis.   Successful PCI of the LAD diagonal bifurcation.  Continue medical therapy as well.   Possible same day PCI candidate.  Diagnostic Dominance: Right  Intervention   _____________   History of Present Illness     Luis [name] is a 78 y.o. male with history of CAD status post DES to the diagonal 08/2014 ramus intermedius was totally occluded at the time, eventually underwent CTO PCI with overlapping DES x2 to the ramus 02/2015.  Chest pain 05/2016 relieved with nitroglycerin and treated with Ranexa but has been off.  NST 12/2017 normal LVEF normal study.  Found to have a totally occluded right carotid on Dopplers 04/2018 but other vessels open. HTN, HLD, Also has adenocarcinoma of the colon status post colectomy.  Patient added to office schedule on 12/25/18 for increased dyspnea on exertion and left arm pain relieved with rest. Was walking 3 miles  daily but had to cut back to 1 1/2 miles daily because of shortness of breath. Was camping the weekend prior and after carrying a propane tank he was very short of breath and extremely tired. Occassionally would get left arm pain when he had dyspnea on exertion. No chest tightness. Similar to when he had his stents placed. Didn't have chest tightness then. Had gained 10-12 lbs. Given his ongoing symptoms he was referred for outpatient cardiac cath.   Hospital Course     Underwent cardiac cath noted above with PCI/DES to the LAD  And balloon angioplasty to diag bifurcation. Plan for DAPT with ASA/plavix for at least 6 months. Seen by cardiac rehab while in short stay. No complications post cath. Radial site stable but developed a small hematoma proximal to cath site which was compressed by RN. Instructions/precautions regarding cath site care given prior to discharge. Continued on home medications including statin and BB.    Luis [name] was seen by Dr. Irish Lack and determined stable for discharge home. Follow up in the office has been arranged. Medications are listed below.   _____________  Discharge Vitals Blood pressure (!) 164/64, pulse (!) 56, temperature 97.7 F (36.5 C), temperature source Oral, resp. rate 19, height 5\' 11"  (1.803 m), weight 105.7 kg, SpO2 96 %.  Filed Weights   01/02/19 0555  Weight: 105.7 kg    Labs & Radiologic Studies    CBC No results for input(s): WBC, NEUTROABS, HGB, HCT, MCV, PLT in the last 72 hours. Basic Metabolic Panel No results for input(s): NA, K, CL,  CO2, GLUCOSE, BUN, CREATININE, CALCIUM, MG, PHOS in the last 72 hours. Liver Function Tests No results for input(s): AST, ALT, ALKPHOS, BILITOT, PROT, ALBUMIN in the last 72 hours. No results for input(s): LIPASE, AMYLASE in the last 72 hours. Cardiac Enzymes No results for input(s): CKTOTAL, CKMB, CKMBINDEX, TROPONINI in the last 72 hours. BNP Invalid input(s): POCBNP D-Dimer No results for  input(s): DDIMER in the last 72 hours. Hemoglobin A1C No results for input(s): HGBA1C in the last 72 hours. Fasting Lipid Panel No results for input(s): CHOL, HDL, LDLCALC, TRIG, CHOLHDL, LDLDIRECT in the last 72 hours. Thyroid Function Tests No results for input(s): TSH, T4TOTAL, T3FREE, THYROIDAB in the last 72 hours.  Invalid input(s): FREET3 _____________  No results found. Disposition   Pt is being discharged home today in good condition.  Follow-up Plans & Appointments    Follow-up Information    Imogene Burn, PA-C Follow up on 01/08/2019.   Specialty: Cardiology Why: at 11:30am for your follow up appt.  Contact information: [address] [place] [address] [phone]          Discharge Instructions    Amb Referral to Cardiac Rehabilitation   Complete by: As directed    Diagnosis: Coronary Stents   After initial evaluation and assessments completed: Virtual Based Care may be provided alone or in conjunction with Phase 2 Cardiac Rehab based on patient barriers.: Yes      Discharge Medications     Medication List    STOP taking these medications   naproxen sodium 220 MG tablet Commonly known as: ALEVE     TAKE these medications   acetaminophen 500 MG tablet Commonly known as: TYLENOL Take 500-1,000 mg by mouth every 6 (six) hours as needed (HEADACHES.).   aspirin EC 81 MG tablet Take 81 mg by mouth daily.   CANNABIDIOL PO Take 12 drops by mouth 2 (two) times a day. CBD OIL   clopidogrel 75 MG tablet Commonly known as: PLAVIX TAKE 1 TABLET (75 MG TOTAL) BY MOUTH DAILY WITH BREAKFAST.   clotrimazole 1 % cream Commonly known as: LOTRIMIN Apply 1 application topically 3 (three) times daily as needed (skin irritation/rash).   metoprolol tartrate 25 MG tablet Commonly known as: LOPRESSOR TAKE 0.5 TABLETS BY MOUTH TWICE A DAY What changed: See the new instructions.   multivitamin with minerals Tabs tablet Take 1 tablet by  mouth every evening. One-A-Day for Men 50+   nitroGLYCERIN 0.4 MG SL tablet Commonly known as: NITROSTAT Place 1 tablet (0.4 mg total) under the tongue every 5 (five) minutes as needed for chest pain. X 3 doses   Rapaflo 8 MG Caps capsule Generic drug: silodosin Take 8 mg by mouth daily as needed (for urination).   rosuvastatin 20 MG tablet Commonly known as: CRESTOR TAKE 1 TABLET BY MOUTH EVERY DAY What changed: when to take this   sildenafil 20 MG tablet Commonly known as: REVATIO Take 60 mg by mouth as needed (for ED).   traMADol 50 MG tablet Commonly known as: ULTRAM Take 100 mg by mouth 2 (two) times a day.   triamcinolone cream 0.5 % Commonly known as: KENALOG Apply 1 application topically 3 (three) times daily as needed (rash/skin irritation.). APPLY TOPICALLY 2-3 TIMES A DAY TO SCAR FOR 2-3 WEEKS THEN AS NEEDED   Vitamin D3 50 MCG (2000 UT) capsule Take 2,000 Units by mouth every evening.       Outstanding Labs/Studies   N/a   Duration  of Discharge Encounter   Greater than 30 minutes including physician time.  Signed, Reino Bellis NP-C 01/02/2019, 12:26 PM   I have examined the patient and reviewed assessment and plan and discussed with patient.  Agree with above as stated.    PCI of LAD/diagonal performed.  COntinue aggressive secondary prevention including DAPT for 6 months.    OK for discharge.  Radial access.   Larae Grooms

## 2019-01-02 NOTE — Progress Notes (Signed)
Small hematoma present on arrival to short stay. Pressure held x 5 minutes with resolution.

## 2019-01-03 ENCOUNTER — Encounter (HOSPITAL_COMMUNITY): Payer: Self-pay | Admitting: Interventional Cardiology

## 2019-01-07 ENCOUNTER — Telehealth: Payer: Self-pay | Admitting: Physician Assistant

## 2019-01-07 DIAGNOSIS — I6529 Occlusion and stenosis of unspecified carotid artery: Secondary | ICD-10-CM | POA: Insufficient documentation

## 2019-01-07 NOTE — Telephone Encounter (Signed)

## 2019-01-07 NOTE — Progress Notes (Signed)
Cardiology Office Note    Date:  01/08/2019   ID:  [name], DOB 06-06-1941, MRN [id number]  PCP:  [name], MD  Cardiologist: Larae Grooms, MD EPS: None  Chief Complaint  Patient presents with  . Hospitalization Follow-up    History of Present Illness:  Luis [name] is a 78 y.o. male with history of CAD status post DES to the diagonal 08/2014 ramus intermedius was totally occluded at the time, eventually underwent CTO PCI with overlapping DES x2 to the ramus 02/2015.  Chest pain 05/2016 relieved with nitroglycerin and treated with Ranexa but has been off.  NST 12/2017 normal LVEF normal study.  Found to have a totally occluded right carotid on Dopplers 04/2018 but other vessels open. HTN, HLD, Also has adenocarcinoma of the colon status post colectomy.   Patient added to office schedule on 12/25/18 for increased dyspnea on exertion and left arm pain relieved with rest. Was walking 3 miles daily but had to cut back to 1 1/2 miles daily because of shortness of breath. Was camping the weekend prior and after carrying a propane tank he was very short of breath and extremely tired. Occassionally would get left arm pain when he had dyspnea on exertion. No chest tightness. Similar to when he had his stents placed. Didn't have chest tightness then. Had gained 10-12 lbs. Given his ongoing symptoms he was referred for outpatient cardiac cath.   Patient underwent cardiac cath 01/02/2019 with DES to the LAD and balloon angioplasty to the diagonal bifurcation.  Plan for aspirin and Plavix for at least 6 months.  Patient doing a lot better-not as short of breath with exercise. Never had chest tighness before the procedure. Has a mild left chest tightness when walking this am. Didn't stop and eased within a minute. Has had it at rest laying in bed and had a brief tightness. Like a pulled muscle. Has lost some weight.     Past Medical History:  Diagnosis Date  . Adenocarcinoma of  cecum ([place]) 06/21/2017  . Arthritis    "mid back; hands; knees" (02/24/2015)  . Basal cell carcinoma    left shoulder; mid chest; right eyelid (02/24/2015)  . CAD (coronary artery disease)    a. 08/2014 Cath/PCI: LM nl, LAD 30p, D1 95 (2.25x12 Resolute Integrity DES), LCX small, RI 100 (attempted PCI) - branches fill via L->L collats, RCA dominant, nl, RPDA/PLA nl, EF 60^. b. 02/24/2015 PCI CTO of Ramus DES x2.  . Chronic bronchitis (HCC)    hx  . Diverticulosis 05/2005  . Elevated lipase   . Fuchs' corneal dystrophy   . History of adenomatous polyp of colon 05/2005   8 mm adenoma  . History of blood transfusion    "related to some of my surgeries"  . Hyperlipidemia   . Hypertension   . Iron deficiency anemia due to chronic blood loss 06/21/2017  . Prostate cancer ([place]) 2011   S/P seed implant  . Sarcoidosis   . Thrombocytopenia ([place])    a. Noted on prior labs, unclear of what w/u done.  . Trifascicular block     Past Surgical History:  Procedure Laterality Date  . BASAL CELL CARCINOMA EXCISION Left    shoulder  . CARDIAC CATHETERIZATION N/A 02/24/2015   Procedure: Coronary/Bypass Graft CTO Intervention;  Surgeon: Jettie Booze, MD;  Location: [place] [facility];  Service: Cardiovascular;  Laterality: N/A;  . CARDIAC CATHETERIZATION  02/24/2015   Procedure: Coronary/Graft Atherectomy;  Surgeon:  Jettie Booze, MD;  Location: [place] [facility];  Service: Cardiovascular;;  . CATARACT EXTRACTION W/ INTRAOCULAR LENS  IMPLANT, BILATERAL Bilateral ~ 2011-2012  . COLONOSCOPY W/ POLYPECTOMY  06/08/2005 and 10/18/2010   8 mm adenoma 2006, none 2012. Diverticulosis and internal hemorrhoids.  . CORNEAL TRANSPLANT Bilateral ~ 2011-2012   "@ same time as cataract OR"  . CORONARY ANGIOPLASTY WITH STENT PLACEMENT  08/2014; 02/24/2015   "1 stent + 1 stent"  . CORONARY STENT INTERVENTION N/A 01/02/2019   Procedure: CORONARY STENT INTERVENTION;  Surgeon: Jettie Booze, MD;  Location:  [facility];  Service: Cardiovascular;  Laterality: N/A;  . EYE SURGERY    . FINGER SURGERY Right 2014   "reattached middle finger"  . INSERTION PROSTATE RADIATION SEED  ~ 2011  . LAPAROSCOPIC CHOLECYSTECTOMY  2015  . LAPAROSCOPIC PARTIAL COLECTOMY N/A 07/26/2017   Procedure: LAPAROSCOPIC PARTIAL COLECTOMY, EXCISION SCROTAL CYST;  Surgeon: Greer Pickerel, MD;  Location: [facility];  Service: General;  Laterality: N/A;  . LEFT HEART CATH AND CORONARY ANGIOGRAPHY N/A 01/02/2019   Procedure: LEFT HEART CATH AND CORONARY ANGIOGRAPHY;  Surgeon: Jettie Booze, MD;  Location: [facility];  Service: Cardiovascular;  Laterality: N/A;  . LEFT HEART CATHETERIZATION WITH CORONARY ANGIOGRAM N/A 08/12/2014   Procedure: LEFT HEART CATHETERIZATION WITH CORONARY ANGIOGRAM;  Surgeon: Blane Ohara, MD;  Location: [facility];  Service: Cardiovascular;  Laterality: N/A;  . LYMPH NODE BIOPSY     mid chest  . MOHS SURGERY Right ~ 2011   "eyelid; for basal cell"  . THOROCOTOMY WITH LOBECTOMY Right ~ 1991   partial removal right lung  . TONSILLECTOMY  ~ 1950    Current Medications: Current Meds  Medication Sig  . acetaminophen (TYLENOL) 500 MG tablet Take 500-1,000 mg by mouth every 6 (six) hours as needed (HEADACHES.).  [name] aspirin EC 81 MG tablet Take 81 mg by mouth daily.  [name] CANNABIDIOL PO Take 12 drops by mouth 2 (two) times a day. CBD OIL  . Cholecalciferol (VITAMIN D3) 2000 units capsule Take 2,000 Units by mouth every evening.   . clopidogrel (PLAVIX) 75 MG tablet TAKE 1 TABLET (75 MG TOTAL) BY MOUTH DAILY WITH BREAKFAST.  . clotrimazole (LOTRIMIN) 1 % cream Apply 1 application topically 3 (three) times daily as needed (skin irritation/rash).  . metoprolol tartrate (LOPRESSOR) 25 MG tablet TAKE 0.5 TABLETS BY MOUTH TWICE A DAY (Patient taking differently: Take 12.5 mg by mouth 2 (two) times a day. )  . Multiple Vitamin (MULTIVITAMIN WITH MINERALS) TABS tablet Take 1 tablet by mouth every  evening. One-A-Day for Men 50+  . nitroGLYCERIN (NITROSTAT) 0.4 MG SL tablet Place 1 tablet (0.4 mg total) under the tongue every 5 (five) minutes as needed for chest pain. X 3 doses  . rosuvastatin (CRESTOR) 20 MG tablet TAKE 1 TABLET BY MOUTH EVERY DAY (Patient taking differently: Take 20 mg by mouth at bedtime. )  . sildenafil (REVATIO) 20 MG tablet Take 60 mg by mouth as needed (for ED).   [name] silodosin (RAPAFLO) 8 MG CAPS capsule Take 8 mg by mouth daily as needed (for urination).   . traMADol (ULTRAM) 50 MG tablet Take 100 mg by mouth 2 (two) times a day.   . triamcinolone cream (KENALOG) 0.5 % Apply 1 application topically 3 (three) times daily as needed (rash/skin irritation.). APPLY TOPICALLY 2-3 TIMES A DAY TO SCAR FOR 2-3 WEEKS THEN AS NEEDED     Allergies:  Patient has no known allergies.   Social History   Socioeconomic History  . Marital status: Married    Spouse name: Not on file  . Number of children: 2  . Years of education: Not on file  . Highest education level: Not on file  Occupational History  . Occupation: Unemployed  Social Needs  . Financial resource strain: Not on file  . Food insecurity    Worry: Not on file    Inability: Not on file  . Transportation needs    Medical: Not on file    Non-medical: Not on file  Tobacco Use  . Smoking status: Former Smoker    Packs/day: 0.00    Years: 59.00    Pack years: 0.00    Types: Cigars  . Smokeless tobacco: Never Used  . Tobacco comment: 02/24/2015 "quit cigarettes ~ 2000 ago but smokes cigars ionce/month"  Substance and Sexual Activity  . Alcohol use: Yes    Alcohol/week: 2.0 standard drinks    Types: 1 Glasses of wine, 1 Standard drinks or equivalent per week    Comment: Drinks maybe 1-2 drinks a week or less  . Drug use: No  . Sexual activity: Not on file  Lifestyle  . Physical activity    Days per week: Not on file    Minutes per session: Not on file  . Stress: Not on file  Relationships  . Social  Herbalist on phone: Not on file    Gets together: Not on file    Attends religious service: Not on file    Active member of club or organization: Not on file    Attends meetings of clubs or organizations: Not on file    Relationship status: Not on file  Other Topics Concern  . Not on file  Social History Narrative   Lives at home wife.       Family History:  The patient's   family history includes Coronary artery disease (age of onset: 9) in his sister; Heart attack in his sister; Heart disease (age of onset: 34) in his father.   ROS:   Please see the history of present illness.    ROS All other systems reviewed and are negative.   PHYSICAL EXAM:   VS:  BP (!) 132/56   Pulse 91   Ht 5\' 11"  (1.803 m)   Wt 232 lb 6.4 oz (105.4 kg)   SpO2 96%   BMI 32.41 kg/m   Physical Exam  GEN: Obese, in no acute distress  Neck: no JVD, carotid bruits, or masses Cardiac:RRR; no murmurs, rubs, or gallops  Respiratory:  clear to auscultation bilaterally, normal work of breathing GI: soft, nontender, nondistended, + BS ATF:TDDUK arm with small hematoma and bruising good radial and brachial pulse, lower extremities without cyanosis, clubbing, or edema, Good distal pulses bilaterally Neuro:  Alert and Oriented x 3 Psych: euthymic mood, full affect  Wt Readings from Last 3 Encounters:  01/08/19 232 lb 6.4 oz (105.4 kg)  01/02/19 233 lb (105.7 kg)  12/25/18 235 lb 12.8 oz (107 kg)      Studies/Labs Reviewed:   EKG:  EKG is not ordered today.    Recent Labs: 12/25/2018: BUN 15; Creatinine, Ser 0.89; Hemoglobin 15.5; Platelets 142; Potassium 4.8; Sodium 138   Lipid Panel    Component Value Date/Time   CHOL 155 10/13/2015 1005   TRIG 186 (H) 10/13/2015 1005   HDL 49 10/13/2015 1005   CHOLHDL 3.2 10/13/2015  1005   VLDL 37 (H) 10/13/2015 1005   LDLCALC 69 10/13/2015 1005    Additional studies/ records that were reviewed today include:  Cardiac catheterization  7/23/2020Cath: 01/02/19    Patent diagonal stent.  Previously placed Ramus drug eluting stent is widely patent.  Prox LAD lesion is 75% stenosed.  A drug-eluting stent was successfully placed using a STENT SYNERGY DES 3X24.  Post intervention, there is a 0% residual stenosis.  1st Diag-1 lesion is 75% stenosed. Balloon angioplasty was performed using a BALLOON SAPPHIRE 2.0X12.[name]  Post intervention, there is a 40% residual stenosis.  The left ventricular systolic function is normal.  LV end diastolic pressure is mildly elevated. LVEDP 15 mm Hg.  The left ventricular ejection fraction is 55-65% by visual estimate.  There is no aortic valve stenosis.   Successful PCI of the LAD diagonal bifurcation.  Continue medical therapy as well.    Possible same day PCI candidate.     ASSESSMENT:    1. Coronary artery disease involving native coronary artery of native heart without angina pectoris   2. Essential hypertension   3. Mixed hyperlipidemia   4. Stenosis of right carotid artery      PLAN:  In order of problems listed above:  CAD status post DES to the diagonal 08/2014 totally occluded ramus intermedius at that time with CTO PCI with DES x2 to the ramus 02/2015, recent increase in angina and DES to the proximal LAD and PTCA of the diagonal bifurcation 01/02/2019. Having some chest pain at rest and with exertion but overall feeling much better. Offered to start amlodipine but patient wants to wait a week and let us know how he's feeling. Can't use Imdur with sildenaflil  Essential hypertension BP stable  Hyperlipidemia  on crestor managed by pcp  Occluded right carotid on Dopplers 04/2018     Medication Adjustments/Labs and Tests Ordered: Current medicines are reviewed at length with the patient today.  Concerns regarding medicines are outlined above.  Medication changes, Labs and Tests ordered today are listed in the Patient Instructions below. Patient Instructions   Medication Instructions:  Your physician recommends that you continue on your current medications as directed. Please refer to the Current Medication list given to you today.  If you need a refill on your cardiac medications before your next appointment, please call your pharmacy.   Lab work: None   If you have labs (blood work) drawn today and your tests are completely normal, you will receive your results only by: [name] MyChart Message (if you have MyChart) OR . A paper copy in the mail If you have any lab test that is abnormal or we need to change your treatment, we will call you to review the results.  Testing/Procedures: None   Follow-Up: At [facility], you and your health needs are our priority.  As part of our continuing mission to provide you with exceptional heart care, we have created designated Provider Care Teams.  These Care Teams include your primary Cardiologist (physician) and Advanced Practice Providers (APPs -  Physician Assistants and Nurse Practitioners) who all work together to provide you with the care you need, when you need it. You will need a follow up appointment in 2 months.  Please call our office 2 months in advance to schedule this appointment.  You may see Larae Grooms, MD or one of the following Advanced Practice Providers on your designated Care Team: . Ermalinda Barrios, PA-C  Any Other Special Instructions Will Be  Listed Below (If Applicable).       Sumner Boast, PA-C  01/08/2019 12:03 PM    [facility] [address], [place], [place]  [address] Phone: [phone]; Fax: [phone]

## 2019-01-08 ENCOUNTER — Ambulatory Visit (INDEPENDENT_AMBULATORY_CARE_PROVIDER_SITE_OTHER): Payer: Medicare Other | Admitting: Physician Assistant

## 2019-01-08 ENCOUNTER — Encounter: Payer: Self-pay | Admitting: Physician Assistant

## 2019-01-08 ENCOUNTER — Telehealth (HOSPITAL_COMMUNITY): Payer: Self-pay

## 2019-01-08 ENCOUNTER — Other Ambulatory Visit: Payer: Self-pay

## 2019-01-08 VITALS — BP 132/56 | HR 91 | Ht 71.0 in | Wt 232.4 lb

## 2019-01-08 DIAGNOSIS — I10 Essential (primary) hypertension: Secondary | ICD-10-CM | POA: Diagnosis not present

## 2019-01-08 DIAGNOSIS — E782 Mixed hyperlipidemia: Secondary | ICD-10-CM

## 2019-01-08 DIAGNOSIS — I6521 Occlusion and stenosis of right carotid artery: Secondary | ICD-10-CM | POA: Diagnosis not present

## 2019-01-08 DIAGNOSIS — I2510 Atherosclerotic heart disease of native coronary artery without angina pectoris: Secondary | ICD-10-CM | POA: Diagnosis not present

## 2019-01-08 NOTE — Patient Instructions (Addendum)
Medication Instructions:  Your physician recommends that you continue on your current medications as directed. Please refer to the Current Medication list given to you today.  If you need a refill on your cardiac medications before your next appointment, please call your pharmacy.   Lab work: None   If you have labs (blood work) drawn today and your tests are completely normal, you will receive your results only by: [name] MyChart Message (if you have MyChart) OR . A paper copy in the mail If you have any lab test that is abnormal or we need to change your treatment, we will call you to review the results.  Testing/Procedures: None   Follow-Up: At [facility], you and your health needs are our priority.  As part of our continuing mission to provide you with exceptional heart care, we have created designated Provider Care Teams.  These Care Teams include your primary Cardiologist (physician) and Advanced Practice Providers (APPs -  Physician Assistants and Nurse Practitioners) who all work together to provide you with the care you need, when you need it. You will need a follow up appointment in 2 months.  Please call our office 2 months in advance to schedule this appointment.  You may see Larae Grooms, MD or one of the following Advanced Practice Providers on your designated Care Team: . Ermalinda Barrios, PA-C  Any Other Special Instructions Will Be Listed Below (If Applicable).

## 2019-01-08 NOTE — Telephone Encounter (Signed)
Pt insurance is active and benefits verified through [facility] $20.00, DED 0/0 met, out of pocket $3,300/$152.10 met, co-insurance 0%. no pre-authorization required. [employer]port, 01/08/2019 @ 8:47am, REF# [id number]  Will contact patient to see if he is interested in the Cardiac Rehab Program. If interested, patient will need to complete follow up appt. Once completed, patient will be contacted for scheduling upon review by the RN Navigator.

## 2019-01-13 NOTE — Telephone Encounter (Signed)
Called patient to see if he was interested in participating in the Cardiac Rehab Program. Patient stated yes. Patient will come in for orientation on 01/30/2019 and will attend the 7:15 exercise class.  Mailed homework package.

## 2019-01-14 ENCOUNTER — Encounter (HOSPITAL_COMMUNITY)
Admission: RE | Admit: 2019-01-14 | Discharge: 2019-01-14 | Disposition: A | Discharge status: Home / Self Care | Payer: Self-pay | Source: Ambulatory Visit | Attending: Interventional Cardiology | Admitting: Interventional Cardiology

## 2019-01-14 ENCOUNTER — Telehealth (HOSPITAL_COMMUNITY): Payer: Self-pay

## 2019-01-14 ENCOUNTER — Other Ambulatory Visit: Payer: Self-pay

## 2019-01-14 NOTE — Progress Notes (Signed)
Called and spoke to pt regarding Virtual Cardiac Rehab.  Pt  was able to download the [facility] app on their smart device with no issues. Pt set up their account and received the following welcome message -"Welcome to the [facility] program. We hope that you will find the exercise program beneficial in your recovery process. Our staff is available to assist with any questions/concerns about your exercise routine. Best wishes". Brief orientation provided to with the advisement to watch the "Intro to Rehab" series located under the Resource tab. Pt verbalized understanding. Will continue to follow and monitor pt progress with feedback as needed. Cherre Huger, BSN Cardiac and Training and development officer

## 2019-01-14 NOTE — Telephone Encounter (Signed)
° °        Confirm Consent - In the setting of the current Covid19 crisis, you are scheduled for a phone visit with your Cardiac or Pulmonary team member.  Just as we do with many in-gym visits, in order for you to participate in this visit, we must obtain consent.  If you'd like, I can send this to your mychart (if signed up) or email for you to review.  Otherwise, I can obtain your verbal consent now.  By agreeing to a telephone visit, we'd like you to understand that the technology does not allow for your Cardiac or Pulmonary Rehab team member to perform a physical assessment, and thus may limit their ability to fully assess your ability to perform exercise programs. If your provider identifies any concerns that need to be evaluated in person, we will make arrangements to do so.  Finally, though the technology is pretty good, we cannot assure that it will always work on either your or our end and we cannot ensure that we have a secure connection.  Cardiac and Pulmonary Rehab Telehealth visits and At Home cardiac and pulmonary rehab are provided at no cost to you.        Are you willing to proceed?"        STAFF: Did the patient verbally acknowledge consent to telehealth visit? Document YES/NO here: Yes     Peter [email or web address]  Cardiac and Pulmonary Rehab Staff        Date 01/14/2019    @ Time 12:05pm  Pt is interested in participating in Virtual Cardiac Rehab. Pt advised that Virtual Cardiac Rehab is provided at no cost to the patient.  Checklist:  1. Pt has smart device  ie smartphone and/or ipad for downloading an app  Yes 2. Reliable internet/wifi service    Yes 3. Understands how to use their smartphone and navigate within an app.  Yes   Reviewed with pt the scheduling process for virtual cardiac rehab.  Pt verbalized understanding.

## 2019-01-21 ENCOUNTER — Ambulatory Visit: Payer: Medicare Other | Admitting: Physician Assistant

## 2019-01-23 ENCOUNTER — Other Ambulatory Visit: Payer: Self-pay | Admitting: Cardiology

## 2019-01-23 ENCOUNTER — Other Ambulatory Visit: Payer: Self-pay | Admitting: Interventional Cardiology

## 2019-01-24 ENCOUNTER — Inpatient Hospital Stay: Payer: Medicare Other | Attending: Oncology

## 2019-01-24 ENCOUNTER — Other Ambulatory Visit: Payer: Self-pay

## 2019-01-24 DIAGNOSIS — C180 Malignant neoplasm of cecum: Secondary | ICD-10-CM | POA: Insufficient documentation

## 2019-01-24 DIAGNOSIS — D509 Iron deficiency anemia, unspecified: Secondary | ICD-10-CM | POA: Insufficient documentation

## 2019-01-24 LAB — CEA (IN HOUSE-CHCC): CEA (CHCC-In House): 6.82 ng/mL — ABNORMAL HIGH (ref 0.00–5.00)

## 2019-01-27 ENCOUNTER — Telehealth: Payer: Self-pay | Admitting: *Deleted

## 2019-01-27 ENCOUNTER — Encounter: Payer: Self-pay | Admitting: *Deleted

## 2019-01-27 ENCOUNTER — Telehealth: Payer: Self-pay | Admitting: Oncology

## 2019-01-27 DIAGNOSIS — C180 Malignant neoplasm of cecum: Secondary | ICD-10-CM

## 2019-01-27 NOTE — Telephone Encounter (Signed)
-----   Message from Ladell Pier, MD sent at 01/25/2019  8:53 AM EDT ----- Please call patient, cea is still mildly elevated, schedule CTs chest and abd/pelvis with contrast next 2-3 weeks, office sherrill or lisa 1-2 days after CTs

## 2019-01-27 NOTE — Addendum Note (Signed)
Addended by: Tania Ade on: 01/27/2019 11:52 AM   Modules accepted: Orders

## 2019-01-27 NOTE — Telephone Encounter (Signed)
Scheduled appt per 8/17 sch message - pt is aware of appt date and time

## 2019-01-27 NOTE — Telephone Encounter (Addendum)
Notified patient that CEA continues to increase and MD wants CT scan of C/A/P with contrast in next 2-3 weeks with OV few days afterward. He agrees to this. Avoid Mon/Thursday. If Tue/Wed needs late afternoon. Friday anytime OK.  Orders entered and staff message sent to managed care for PA. @ 1150: Scan does not need PA. Scheduled for8/28/20 at 1045/1100. Lab scheduled for 0945. Left VM requesting a return call to go over specifics. Also sent Mychart message.

## 2019-01-30 ENCOUNTER — Other Ambulatory Visit: Payer: Self-pay | Admitting: *Deleted

## 2019-01-30 ENCOUNTER — Ambulatory Visit (HOSPITAL_COMMUNITY): Payer: Medicare Other

## 2019-01-30 DIAGNOSIS — C180 Malignant neoplasm of cecum: Secondary | ICD-10-CM

## 2019-02-03 ENCOUNTER — Ambulatory Visit (HOSPITAL_COMMUNITY): Payer: Medicare Other

## 2019-02-05 ENCOUNTER — Ambulatory Visit (HOSPITAL_COMMUNITY): Payer: Medicare Other

## 2019-02-07 ENCOUNTER — Ambulatory Visit (HOSPITAL_COMMUNITY)
Admission: RE | Admit: 2019-02-07 | Discharge: 2019-02-07 | Disposition: A | Discharge status: Home / Self Care | Payer: Medicare Other | Source: Ambulatory Visit | Attending: Oncology | Admitting: Oncology

## 2019-02-07 ENCOUNTER — Ambulatory Visit (HOSPITAL_COMMUNITY): Admission: RE | Admit: 2019-02-07 | Payer: Medicare Other | Source: Ambulatory Visit

## 2019-02-07 ENCOUNTER — Other Ambulatory Visit: Payer: Self-pay

## 2019-02-07 ENCOUNTER — Ambulatory Visit (HOSPITAL_COMMUNITY): Payer: Medicare Other

## 2019-02-07 ENCOUNTER — Inpatient Hospital Stay: Payer: Medicare Other

## 2019-02-07 DIAGNOSIS — C180 Malignant neoplasm of cecum: Secondary | ICD-10-CM | POA: Insufficient documentation

## 2019-02-07 LAB — BASIC METABOLIC PANEL - CANCER CENTER ONLY
Anion gap: 8 (ref 5–15)
BUN: 15 mg/dL (ref 8–23)
CO2: 28 mmol/L (ref 22–32)
Calcium: 9.2 mg/dL (ref 8.9–10.3)
Chloride: 102 mmol/L (ref 98–111)
Creatinine: 0.94 mg/dL (ref 0.61–1.24)
GFR, Est AFR Am: >60 mL/min (ref >60)
GFR, Estimated: >60 mL/min (ref >60)
Glucose, Bld: 98 mg/dL (ref 70–99)
Potassium: 4.5 mmol/L (ref 3.5–5.1)
Sodium: 138 mmol/L (ref 135–145)

## 2019-02-07 MED ORDER — IOHEXOL 300 MG/ML  SOLN
100.0000 mL | Freq: Once | INTRAMUSCULAR | Status: AC | PRN
  Administered 2019-02-07: 100 mL via INTRAVENOUS

## 2019-02-07 MED ORDER — SODIUM CHLORIDE (PF) 0.9 % IJ SOLN
INTRAMUSCULAR | Status: AC
  Filled 2019-02-07: qty 50

## 2019-02-10 ENCOUNTER — Telehealth (HOSPITAL_COMMUNITY): Payer: Self-pay | Admitting: *Deleted

## 2019-02-10 ENCOUNTER — Ambulatory Visit (HOSPITAL_COMMUNITY): Payer: Medicare Other

## 2019-02-10 NOTE — Telephone Encounter (Signed)
PC to pt regarding symptoms of chest discomfort reported in [facility] Virtual Cardiac Rehab App.  No answer. VM left.  Message sent to patient within app earlier today with no answer.

## 2019-02-11 ENCOUNTER — Encounter (HOSPITAL_COMMUNITY)
Admission: RE | Admit: 2019-02-11 | Discharge: 2019-02-11 | Disposition: A | Discharge status: Home / Self Care | Payer: Self-pay | Source: Ambulatory Visit | Attending: Interventional Cardiology | Admitting: Interventional Cardiology

## 2019-02-11 ENCOUNTER — Other Ambulatory Visit: Payer: Self-pay | Admitting: *Deleted

## 2019-02-11 ENCOUNTER — Inpatient Hospital Stay: Payer: Medicare Other | Attending: Oncology | Admitting: Oncology

## 2019-02-11 ENCOUNTER — Telehealth: Payer: Self-pay | Admitting: Interventional Cardiology

## 2019-02-11 ENCOUNTER — Other Ambulatory Visit: Payer: Self-pay

## 2019-02-11 ENCOUNTER — Telehealth: Payer: Self-pay | Admitting: Oncology

## 2019-02-11 VITALS — BP 146/78 | HR 61 | Temp 98.3°F | Resp 17 | Ht 71.0 in | Wt 228.9 lb

## 2019-02-11 DIAGNOSIS — Z5111 Encounter for antineoplastic chemotherapy: Secondary | ICD-10-CM | POA: Insufficient documentation

## 2019-02-11 DIAGNOSIS — C787 Secondary malignant neoplasm of liver and intrahepatic bile duct: Secondary | ICD-10-CM | POA: Diagnosis not present

## 2019-02-11 DIAGNOSIS — C180 Malignant neoplasm of cecum: Secondary | ICD-10-CM

## 2019-02-11 DIAGNOSIS — I2510 Atherosclerotic heart disease of native coronary artery without angina pectoris: Secondary | ICD-10-CM | POA: Insufficient documentation

## 2019-02-11 MED ORDER — AMLODIPINE BESYLATE 5 MG PO TABS: 5.0000 mg | ORAL_TABLET | Freq: Every day | ORAL | 5 refills | Status: DC

## 2019-02-11 NOTE — Telephone Encounter (Signed)
Called the pt on his mobile number and he reports that he has been having on/ off chest discomfort since he saw Ermalinda Barrios PA in 12/2018... he says it usually lasts for a minute or less and relieves on it's own. He says it does not happen consistently and not related to exercise only. He has not had to take any nitro until yesterday. The pain usually always occurs in the morning between rising at 5 am and going to work about 8-9 am.   Wilburn Mylar he was walking for his morning workout and he developed pain in his chest that was sharp in nature and it did not go away as quick as it usually does so he took a nitro and repeated it in 5 minutes before it went away... he denies SOB, dizziness assoc with it.   He has felt well ever since and has not had any more pain to report.   He is asking to have Michele L. Review the message... he says she spoke to him about starting some new meds at his OV if this was going to contimue to happen.   Will forward to Ermalinda Barrios PA for review and recommnedations.

## 2019-02-11 NOTE — Telephone Encounter (Signed)
Spoke with maria at [facility] and she notes the pt had exercised through their virtual APP and walked for a mile yesterday but noted chest pain before and after...she attempted to call the pt but she could not reach him... she called him this morning to follow up with him and he reports no further chest pain today and he walked 2 1/2 miles this morning but in reviewing his meds... he noted to her that he took 2 nitro for chest pain yesterday. His BP on the APP today was 147/84 and resting HR 65..   I called the pts home number and his wife said to call his mobile in about 15 minus... since he is on his way into his office.   I will call the pt back mid-morning.

## 2019-02-11 NOTE — Telephone Encounter (Signed)
Cardiac Rehab called wanting to speak to triage. She wouldn't state what is was about.

## 2019-02-11 NOTE — Telephone Encounter (Signed)
Gave avs and calendar ° °

## 2019-02-11 NOTE — Telephone Encounter (Signed)
Please send in Rx for Norvasc 5 mg once daily and f/u with Dr. Doristine Johns be virtual in the next couple weeks. thanks

## 2019-02-11 NOTE — Telephone Encounter (Signed)
Patient documented having chest discomfort yesterday before and after exercise on the virtual cardiac rehab APP. Attempted to speak to patient regarding this episode and had to leave message. Spoke to the patient this morning. The patient did not tell me how bad his discomfort was but after reviewing his medications over the phone. Mr Luis [name] disclosed that he had taken two sublingual nitroglycerin yesterday with relief. " I had planned to call Sharyn Lull about putting me on that new medication. Harrie Jeans denies having any chest discomfort today and has already walked 2.5 miles this morning. Dr Hassell Done office called and notified. Spoke with Lelon Frohlich who will contact the patient.Barnet Pall, RN,BSN 02/11/2019 8:54 AM

## 2019-02-11 NOTE — Telephone Encounter (Signed)
Called and spoke to patient and made him aware of recommendations below. Patient already has an appt on 9/16 with Ermalinda Barrios and will keep that appt. Rx for amlodipine 5 mg QD sent to preferred pharmacy.

## 2019-02-11 NOTE — Progress Notes (Signed)
  [facility] OFFICE PROGRESS NOTE   Diagnosis: Colon cancer  INTERVAL HISTORY:   Luis [name] returns for a scheduled visit.  He reports feeling well.  Good appetite and energy level.  He reports intentional weight loss. The CEA was mildly elevated on 11/25/2018.  A repeat CEA on 01/24/2019 again returned elevated.  He was referred for restaging CTs.  Objective:  Vital signs in last 24 hours:  Blood pressure (!) 146/78, pulse 61, temperature 98.3 F (36.8 C), resp. rate 17, height '5\' 11"'$  (1.803 m), weight 228 lb 14.4 oz (103.8 kg), SpO2 98 %.    HEENT: Neck without mass Lymphatics: No cervical, supraclavicular, axillary, or inguinal nodes GI: No hepatosplenomegaly, nontender, no mass Vascular: No leg edema    Lab Results:  Lab Results  Component Value Date   WBC 5.7 12/25/2018   HGB 15.5 12/25/2018   HCT 45.7 12/25/2018   MCV 92 12/25/2018   PLT 142 (L) 12/25/2018   NEUTROABS 4.6 08/27/2017    CMP  Lab Results  Component Value Date   NA 138 02/07/2019   K 4.5 02/07/2019   CL 102 02/07/2019   CO2 28 02/07/2019   GLUCOSE 98 02/07/2019   BUN 15 02/07/2019   CREATININE 0.94 02/07/2019   CALCIUM 9.2 02/07/2019   PROT 6.1 (L) 07/23/2017   ALBUMIN 3.5 07/23/2017   AST 30 07/23/2017   ALT 23 07/23/2017   ALKPHOS 67 07/23/2017   BILITOT 0.6 07/23/2017   GFRNONAA >60 02/07/2019   GFRAA >60 02/07/2019    Lab Results  Component Value Date   CEA1 6.82 (H) 01/24/2019    Imaging: CT images from 02/07/2019 reviewed with Luis [name]   Medications: I have reviewed the patient's current medications.   Assessment/Plan:  1. Adenocarcinoma of the cecum, stage IIA (T3N0), status post a right colectomy 07/26/2017 ? 0/16 lymph nodes positive, no lymphovascular invasion, perineural invasion present ? MSI-stable, no loss of mismatch repair protein expression ? Surveillance colonoscopy 07/26/2018-no polyps found ? Elevated CEA June 2020, persistently elevated  August 2020 ? CTs 02/07/2019- bilateral liver metastases, no extrahepatic metastatic disease, changes of sarcoidosis in the chest 2. History of colon polyps 3. Microcytic anemia January 2019 4. Sarcoidosis 5. Coronary artery disease     Disposition: Luis [name] has a history of early stage colon cancer.  The CEA is mildly elevated and restaging CTs are consistent with development of liver metastases.  I discussed the probable diagnosis of metastatic colon cancer, the prognosis, and treatment options with Luis [name].  We reviewed the CT images.  He will be referred for a diagnostic liver biopsy.  He will return for an office visit after the liver biopsy to make a treatment plan.  We will likely recommend initial chemotherapy and he may be a candidate for hepatic directed therapy in the future.  I will present his case at the GI tumor conference.  25 minutes were spent with the patient today.  The majority of the time was used for counseling of care.  Betsy Coder, MD  02/11/2019  12:30 PM

## 2019-02-12 ENCOUNTER — Telehealth: Payer: Self-pay | Admitting: *Deleted

## 2019-02-12 ENCOUNTER — Ambulatory Visit (HOSPITAL_COMMUNITY): Payer: Medicare Other

## 2019-02-12 NOTE — Telephone Encounter (Signed)
Patient called to inquire about status of his US liver biopsy. Determined after speaking w/central scheduling that they are waiting to hear from Dr. Irish Lack about holding Plavix for procedure. He plans to call Dr. Hassell Done office himself to request they contact radiology as soon as possible.

## 2019-02-12 NOTE — Telephone Encounter (Signed)
"  Luis [name] [phone]).  What's going on with the Biopsy I need scheduled?  Called earlier today and have called Dr. Emily Filbert office.  Dr. Emily Filbert office said they ned a call from Dr. Gearldine Shown office not from me.  I need this biopsy as soon as possible."  Message left for collaborative with this call information.  Currently no further questions or needs.

## 2019-02-13 ENCOUNTER — Encounter: Payer: Self-pay | Admitting: Oncology

## 2019-02-14 ENCOUNTER — Ambulatory Visit (HOSPITAL_COMMUNITY): Payer: Medicare Other

## 2019-02-14 ENCOUNTER — Telehealth: Payer: Self-pay | Admitting: *Deleted

## 2019-02-14 NOTE — Telephone Encounter (Signed)
Informed patient of Dr. Hassell Done response that it would be ideal to wait 3 weeks, but patient wants to pursue biopsy now. Directions were to hold Plavix x 5 days prior and resume asap after biopsy since it will take a few days for it to become therapeutic. Dr. Benay Spice defers timing of biopsy to patient. He will keep his 9/15 office visit as scheduled to discuss future chemotherapy treatment. Called central scheduling and was told to fax Dr. Hassell Done directions to [phone] att: Morey Hummingbird and they will start on scheduling. Informed them that he is very anxious to get this done as soon as possible.

## 2019-02-19 ENCOUNTER — Ambulatory Visit (HOSPITAL_COMMUNITY): Payer: Medicare Other

## 2019-02-21 ENCOUNTER — Ambulatory Visit (HOSPITAL_COMMUNITY): Payer: Medicare Other

## 2019-02-21 ENCOUNTER — Other Ambulatory Visit: Payer: Self-pay | Admitting: Student

## 2019-02-24 ENCOUNTER — Ambulatory Visit (HOSPITAL_COMMUNITY): Payer: Medicare Other

## 2019-02-24 ENCOUNTER — Telehealth: Payer: Self-pay | Admitting: Interventional Cardiology

## 2019-02-24 ENCOUNTER — Telehealth: Payer: Self-pay | Admitting: *Deleted

## 2019-02-24 ENCOUNTER — Other Ambulatory Visit: Payer: Self-pay | Admitting: Oncology

## 2019-02-24 NOTE — Telephone Encounter (Signed)
Called and spoke to patient. He states that he is having a biopsy done soon and was approved by Dr. Irish Lack to hold his plavix for 5 days prior to the procedure (I do not see this documentation). The patient states that he has already been holding for 2 days and and his procedure has been postponed until 9/21. Patient is s/p DES to LAD and  I have instructed the patient to restart his plavix for the next 2 days and I will forward to Dr. Irish Lack for additional recommendation.

## 2019-02-24 NOTE — Telephone Encounter (Signed)
  Patient got approval to be off his clopidogrel (PLAVIX) 75 MG tablet to have a biopsy done. He has been off of it for 3 days but the biopsy got rescheduled to 03/03/19. Should he take today and tomorrow and then go off of it again? Or continue off of it until the biopsy on the 21st? Please call Morey Hummingbird in Radiology Scheduling at [facility] to let her know. Please see note on 02/14/19 from Merceda Elks, RN regarding previous instructions from Dr Irish Lack. Did not see a clearance to attach message to.

## 2019-02-24 NOTE — Telephone Encounter (Signed)
Had to reschedule liver biopsy to 03/03/2019. Patient only held his Plavix 3 days instead of required 5 days. Asking if he should take it today and tomorrow and start holding it on 02/19/15/2020? Per Dr. Benay Spice: take plavix today and tomorrow, then hold. Patient notified.

## 2019-02-25 ENCOUNTER — Inpatient Hospital Stay: Payer: Medicare Other | Admitting: Oncology

## 2019-02-25 ENCOUNTER — Telehealth: Payer: Self-pay | Admitting: Oncology

## 2019-02-25 ENCOUNTER — Other Ambulatory Visit: Payer: Self-pay

## 2019-02-25 VITALS — BP 140/70 | HR 60 | Temp 98.0°F | Resp 19 | Ht 71.0 in | Wt 228.3 lb

## 2019-02-25 DIAGNOSIS — C180 Malignant neoplasm of cecum: Secondary | ICD-10-CM

## 2019-02-25 DIAGNOSIS — Z7189 Other specified counseling: Secondary | ICD-10-CM

## 2019-02-25 DIAGNOSIS — Z5111 Encounter for antineoplastic chemotherapy: Secondary | ICD-10-CM | POA: Diagnosis not present

## 2019-02-25 NOTE — Progress Notes (Signed)
  [facility] OFFICE PROGRESS NOTE   Diagnosis: Colon cancer  INTERVAL HISTORY:   Luis [name] returns for a scheduled visit.  He has not undergone the liver biopsy secondary to a delay in scheduling with the need to hold Plavix.  He feels well.  No complaint.  Objective:  Vital signs in last 24 hours:  Blood pressure 140/70, pulse 60, temperature 98 F (36.7 C), temperature source Oral, resp. rate 19, height '5\' 11"'$  (1.803 m), weight 228 lb 4.8 oz (103.6 kg), SpO2 99 %.    Physical examination not performed today  Lab Results:  Lab Results  Component Value Date   WBC 5.7 12/25/2018   HGB 15.5 12/25/2018   HCT 45.7 12/25/2018   MCV 92 12/25/2018   PLT 142 (L) 12/25/2018   NEUTROABS 4.6 08/27/2017    CMP  Lab Results  Component Value Date   NA 138 02/07/2019   K 4.5 02/07/2019   CL 102 02/07/2019   CO2 28 02/07/2019   GLUCOSE 98 02/07/2019   BUN 15 02/07/2019   CREATININE 0.94 02/07/2019   CALCIUM 9.2 02/07/2019   PROT 6.1 (L) 07/23/2017   ALBUMIN 3.5 07/23/2017   AST 30 07/23/2017   ALT 23 07/23/2017   ALKPHOS 67 07/23/2017   BILITOT 0.6 07/23/2017   GFRNONAA >60 02/07/2019   GFRAA >60 02/07/2019    Lab Results  Component Value Date   CEA1 6.82 (H) 01/24/2019     Medications: I have reviewed the patient's current medications.   Assessment/Plan: 1. Adenocarcinoma of the cecum, stage IIA (T3N0), status post a right colectomy 07/26/2017 ? 0/16 lymph nodes positive, no lymphovascular invasion, perineural invasion present ? MSI-stable, no loss of mismatch repair protein expression ? Surveillance colonoscopy 07/26/2018-no polyps found ? Elevated CEA June 2020, persistently elevated August 2020 ? CTs 02/07/2019- bilateral liver metastases, no extrahepatic metastatic disease, changes of sarcoidosis in the chest 2. History of colon polyps 3. Microcytic anemia January 2019 4. Sarcoidosis 5. Coronary artery disease    Disposition: Mr. Prinsen  appears well.  He has a history of colon cancer.  The CEA is elevated and a CT is consistent with liver metastases.  I discussed the likely diagnosis of metastatic colon cancer and treatment options with Mr. Teeple and his wife.  His wife was present by telephone for today's visit.  He is scheduled for a liver biopsy on 03/03/2019.  He has a vacation scheduled for later next week.  He will return for an office visit on 03/10/2019 with the plan to begin chemotherapy later that week.  He will be referred for placement of a Port-A-Cath.  He understands the small possibility the biopsy will reveal benign tissue.  I will present his case at the GI tumor conference next week.  I recommend proceeding with FOLFOX chemotherapy if the biopsy confirms metastatic colon cancer.  We reviewed potential toxicities associated with the FOLFOX regimen including the chance for nausea/vomiting, mucositis, diarrhea, alopecia, and hematologic toxicity.  We discussed the rash, sun sensitivity, hyperpigmentation, and hand/foot syndrome associated with 5-fluorouracil.  We discussed the allergic reaction to various types of neuropathy seen with oxaliplatin.  He agrees to proceed.  He will attend chemotherapy teaching class.  Betsy Coder, MD  02/25/2019  10:02 AM

## 2019-02-25 NOTE — Telephone Encounter (Signed)
Called and spoke with patient. Confirmed appts  °

## 2019-02-25 NOTE — Progress Notes (Signed)
START ON PATHWAY REGIMEN - Colorectal     A cycle is every 14 days:     Oxaliplatin      Leucovorin      Fluorouracil      Fluorouracil   **Always confirm dose/schedule in your pharmacy ordering system**  Patient Characteristics: Distant Metastases, Nonsurgical Candidate, KRAS/NRAS Mutation Positive/Unknown (BRAF V600 Wild-Type/Unknown), Standard Cytotoxic Therapy, First Line Standard Cytotoxic Therapy, Bevacizumab Ineligible, PS = 0,1 Tumor Location: Colon Therapeutic Status: Distant Metastases Microsatellite/Mismatch Repair Status: MSS/pMMR BRAF Mutation Status: Awaiting Test Results KRAS/NRAS Mutation Status: Awaiting Test Results Standard Cytotoxic Line of Therapy: First Line Standard Cytotoxic Therapy ECOG Performance Status: 0 Bevacizumab Eligibility: Ineligible Intent of Therapy: Non-Curative / Palliative Intent, Discussed with Patient 

## 2019-02-25 NOTE — Progress Notes (Signed)
Cardiology Office Note    Date:  02/26/2019   ID:  Luis [name], DOB 1941/05/29, MRN [id number]  PCP:  [name], MD  Cardiologist: Larae Grooms, MD EPS: None  No chief complaint on file.   History of Present Illness:  Luis [name] is a 78 y.o. male  with history of CAD status post DES to the diagonal 08/2014 ramus intermedius was totally occluded at the time, eventually underwent CTO PCI with overlapping DES x2 to the ramus 02/2015.  Chest pain 05/2016 relieved with nitroglycerin and treated with Ranexa but has been off.  NST 12/2017 normal LVEF normal study.  Found to have a totally occluded right carotid on Dopplers 04/2018 but other vessels open. HTN, HLD, Also has adenocarcinoma of the colon status post colectomy.   Patient added to office schedule on 12/25/18 for increased dyspnea on exertion and left arm pain relieved with rest. Was walking 3 miles daily but had to cut back to 1 1/2 miles daily because of shortness of breath.  No chest tightness. Similar to when he had his stents placed. Didn't have chest tightness then. Had gained 10-12 lbs. Given his ongoing symptoms he was referred for outpatient cardiac cath.    Patient underwent cardiac cath 01/02/2019 with DES to the LAD and balloon angioplasty to the diagonal bifurcation.  Plan for aspirin and Plavix for at least 6 months.  I had telemedicine visit 01/08/19 and he had some chest pain. I offered to start amlodipine but he wanted to hold off. Didn't use Imdur because of sildenafil. Patient needs liver  biopsy and has to be off his Plavix for 5 days.Discussed with Dr. Irish Lack who says it's fine to proceed with liver biopsy and hold plavix for 5 days.  A note from cardiac rehab states patient was taking NTG for chest pain and also had stopped plavix 9/8, restarted it and stopped again 9/11/200 and restarted 02/24/19 in anticipation of his liver biopsy. Patient having chest pain-like a sore muscle-keeps walking and it  goes away within seconds. Had a chest pain yesterday at rest 5-10 min relieved with 2 NTG. He says amlodipine did help a lot-not as frequent or as long. Chest pain over the weekend unrelieved with NtG but relieved with belching. He had eaten prime rib, mac and cheese, fried shrimp and loaded bake potato which is unusual for him. BP running 140's at home.  Prior to his stent in July he was not having any chest pain but his symptom was dyspnea on exertion which  he is not having now.     Past Medical History:  Diagnosis Date  . Adenocarcinoma of cecum ([place]) 06/21/2017  . Arthritis    "mid back; hands; knees" (02/24/2015)  . Basal cell carcinoma    left shoulder; mid chest; right eyelid (02/24/2015)  . CAD (coronary artery disease)    a. 08/2014 Cath/PCI: LM nl, LAD 30p, D1 95 (2.25x12 Resolute Integrity DES), LCX small, RI 100 (attempted PCI) - branches fill via L->L collats, RCA dominant, nl, RPDA/PLA nl, EF 60^. b. 02/24/2015 PCI CTO of Ramus DES x2.  . Chronic bronchitis (HCC)    hx  . Diverticulosis 05/2005  . Elevated lipase   . Fuchs' corneal dystrophy   . History of adenomatous polyp of colon 05/2005   8 mm adenoma  . History of blood transfusion    "related to some of my surgeries"  . Hyperlipidemia   . Hypertension   . Iron deficiency anemia  due to chronic blood loss 06/21/2017  . Prostate cancer ([place]) 2011   S/P seed implant  . Sarcoidosis   . Thrombocytopenia ([place])    a. Noted on prior labs, unclear of what w/u done.  . Trifascicular block     Past Surgical History:  Procedure Laterality Date  . BASAL CELL CARCINOMA EXCISION Left    shoulder  . CARDIAC CATHETERIZATION N/A 02/24/2015   Procedure: Coronary/Bypass Graft CTO Intervention;  Surgeon: Jettie Booze, MD;  Location: [facility];  Service: Cardiovascular;  Laterality: N/A;  . CARDIAC CATHETERIZATION  02/24/2015   Procedure: Coronary/Graft Atherectomy;  Surgeon: Jettie Booze, MD;  Location: [facility];  Service: Cardiovascular;;  . CATARACT EXTRACTION W/ INTRAOCULAR LENS  IMPLANT, BILATERAL Bilateral ~ 2011-2012  . COLONOSCOPY W/ POLYPECTOMY  06/08/2005 and 10/18/2010   8 mm adenoma 2006, none 2012. Diverticulosis and internal hemorrhoids.  . CORNEAL TRANSPLANT Bilateral ~ 2011-2012   "@ same time as cataract OR"  . CORONARY ANGIOPLASTY WITH STENT PLACEMENT  08/2014; 02/24/2015   "1 stent + 1 stent"  . CORONARY STENT INTERVENTION N/A 01/02/2019   Procedure: CORONARY STENT INTERVENTION;  Surgeon: Jettie Booze, MD;  Location: [facility];  Service: Cardiovascular;  Laterality: N/A;  . EYE SURGERY    . FINGER SURGERY Right 2014   "reattached middle finger"  . INSERTION PROSTATE RADIATION SEED  ~ 2011  . LAPAROSCOPIC CHOLECYSTECTOMY  2015  . LAPAROSCOPIC PARTIAL COLECTOMY N/A 07/26/2017   Procedure: LAPAROSCOPIC PARTIAL COLECTOMY, EXCISION SCROTAL CYST;  Surgeon: Greer Pickerel, MD;  Location: [facility];  Service: General;  Laterality: N/A;  . LEFT HEART CATH AND CORONARY ANGIOGRAPHY N/A 01/02/2019   Procedure: LEFT HEART CATH AND CORONARY ANGIOGRAPHY;  Surgeon: Jettie Booze, MD;  Location: [facility];  Service: Cardiovascular;  Laterality: N/A;  . LEFT HEART CATHETERIZATION WITH CORONARY ANGIOGRAM N/A 08/12/2014   Procedure: LEFT HEART CATHETERIZATION WITH CORONARY ANGIOGRAM;  Surgeon: Blane Ohara, MD;  Location: [facility];  Service: Cardiovascular;  Laterality: N/A;  . LYMPH NODE BIOPSY     mid chest  . MOHS SURGERY Right ~ 2011   "eyelid; for basal cell"  . THOROCOTOMY WITH LOBECTOMY Right ~ 1991   partial removal right lung  . TONSILLECTOMY  ~ 1950    Current Medications: Current Meds  Medication Sig  . aspirin EC 81 MG tablet Take 81 mg by mouth daily.  [name] CANNABIDIOL PO Take 12 drops by mouth 2 (two) times a day. CBD OIL  . Cholecalciferol (VITAMIN D3) 2000 units capsule Take 2,000 Units by mouth every evening.   . clopidogrel (PLAVIX)  75 MG tablet TAKE 1 TABLET (75 MG TOTAL) BY MOUTH DAILY WITH BREAKFAST.  . clotrimazole (LOTRIMIN) 1 % cream Apply 1 application topically 3 (three) times daily as needed (skin irritation/rash).  . metoprolol tartrate (LOPRESSOR) 25 MG tablet TAKE 0.5 TABLETS BY MOUTH TWICE A DAY  . Multiple Vitamin (MULTIVITAMIN WITH MINERALS) TABS tablet Take 1 tablet by mouth every evening. One-A-Day for Men 50+  . nitroGLYCERIN (NITROSTAT) 0.4 MG SL tablet Place 1 tablet (0.4 mg total) under the tongue every 5 (five) minutes as needed for chest pain. X 3 doses  . rosuvastatin (CRESTOR) 20 MG tablet TAKE 1 TABLET BY MOUTH EVERY DAY  . sildenafil (REVATIO) 20 MG tablet Take 60 mg by mouth as needed (for ED).   [name] silodosin (RAPAFLO) 8 MG CAPS capsule Take 8 mg by  mouth daily as needed (for urination).   . traMADol (ULTRAM) 50 MG tablet Take 100 mg by mouth 2 (two) times a day.   . triamcinolone cream (KENALOG) 0.5 % Apply 1 application topically 3 (three) times daily as needed (rash/skin irritation.). APPLY TOPICALLY 2-3 TIMES A DAY TO SCAR FOR 2-3 WEEKS THEN AS NEEDED  . [DISCONTINUED] amLODipine (NORVASC) 5 MG tablet Take 1 tablet (5 mg total) by mouth daily.     Allergies:   Patient has no known allergies.   Social History   Socioeconomic History  . Marital status: Married    Spouse name: Not on file  . Number of children: 2  . Years of education: Not on file  . Highest education level: Not on file  Occupational History  . Occupation: Unemployed  Social Needs  . Financial resource strain: Not on file  . Food insecurity    Worry: Not on file    Inability: Not on file  . Transportation needs    Medical: Not on file    Non-medical: Not on file  Tobacco Use  . Smoking status: Former Smoker    Packs/day: 0.00    Years: 59.00    Pack years: 0.00    Types: Cigars  . Smokeless tobacco: Never Used  . Tobacco comment: 02/24/2015 "quit cigarettes ~ 2000 ago but smokes cigars ionce/month"  Substance  and Sexual Activity  . Alcohol use: Yes    Alcohol/week: 2.0 standard drinks    Types: 1 Glasses of wine, 1 Standard drinks or equivalent per week    Comment: Drinks maybe 1-2 drinks a week or less  . Drug use: No  . Sexual activity: Not on file  Lifestyle  . Physical activity    Days per week: Not on file    Minutes per session: Not on file  . Stress: Not on file  Relationships  . Social Herbalist on phone: Not on file    Gets together: Not on file    Attends religious service: Not on file    Active member of club or organization: Not on file    Attends meetings of clubs or organizations: Not on file    Relationship status: Not on file  Other Topics Concern  . Not on file  Social History Narrative   Lives at home wife.       Family History:  The patient's family history includes Coronary artery disease (age of onset: 43) in his sister; Heart attack in his sister; Heart disease (age of onset: 108) in his father.   ROS:   Please see the history of present illness.    ROS All other systems reviewed and are negative.   PHYSICAL EXAM:   VS:  BP (!) 118/58   Pulse 61   Ht _0  (1.803 m)   Wt 229 lb 6.4 oz (104.1 kg)   SpO2 96%   BMI 31.99 kg/m   Physical Exam  GEN: Well nourished, well developed, in no acute distress  Neck: no JVD, carotid bruits, or masses Cardiac:RRR; no murmurs, rubs, or gallops  Respiratory:  clear to auscultation bilaterally, normal work of breathing GI: soft, nontender, nondistended, + BS Ext: without cyanosis, clubbing, or edema, Good distal pulses bilaterally Neuro:  Alert and Oriented x 3 Psych: euthymic mood, full affect  Wt Readings from Last 3 Encounters:  02/26/19 229 lb 6.4 oz (104.1 kg)  02/25/19 228 lb 4.8 oz (103.6 kg)  02/11/19 228 lb 14.4  oz (103.8 kg)      Studies/Labs Reviewed:   EKG:  EKG is not ordered today.   Recent Labs: 12/25/2018: Hemoglobin 15.5; Platelets 142 02/07/2019: BUN 15; Creatinine 0.94;  Potassium 4.5; Sodium 138   Lipid Panel    Component Value Date/Time   CHOL 155 10/13/2015 1005   TRIG 186 (H) 10/13/2015 1005   HDL 49 10/13/2015 1005   CHOLHDL 3.2 10/13/2015 1005   VLDL 37 (H) 10/13/2015 1005   LDLCALC 69 10/13/2015 1005    Additional studies/ records that were reviewed today include:   Cardiac catheterization 7/23/2020Cath: 01/02/19    Patent diagonal stent.  Previously placed Ramus drug eluting stent is widely patent.  Prox LAD lesion is 75% stenosed.  A drug-eluting stent was successfully placed using a STENT SYNERGY DES 3X24.  Post intervention, there is a 0% residual stenosis.  1st Diag-1 lesion is 75% stenosed. Balloon angioplasty was performed using a BALLOON SAPPHIRE 2.0X12.[name]  Post intervention, there is a 40% residual stenosis.  The left ventricular systolic function is normal.  LV end diastolic pressure is mildly elevated. LVEDP 15 mm Hg.  The left ventricular ejection fraction is 55-65% by visual estimate.  There is no aortic valve stenosis.   Successful PCI of the LAD diagonal bifurcation.  Continue medical therapy as well.    Possible same day PCI candidate.         ASSESSMENT:    1. Coronary artery disease involving native coronary artery of native heart without angina pectoris   2. Essential hypertension   3. Mixed hyperlipidemia   4. Stenosis of right carotid artery      PLAN:  In order of problems listed above:  CAD status post DES to the diagonal 08/2014 totally occluded ramus intermedius at that time with CTO PCI with DES x2 to the ramus 02/2015, recent increase in angina and DES to the proximal LAD and PTCA of the diagonal bifurcation 01/02/2019. Patient is scheduled for liver biopsy 03/03/19 and needs to hold Plavix for 5 days.I discussed with  Dr. Beau Fanny said he can proceed with this.   Patient is having some atypical chest pain described as a soreness along his left lower rib cage but is responsive to nitroglycerin at  times.  His symptom before his last stent was dyspnea on exertion without chest pain.  He says amlodipine seemed to help.  Will increase amlodipine to 5 mg twice daily.  Follow-up with Dr. Irish Lack in 4 to 6 weeks.  Essential hypertension BP stable   Hyperlipidemia  on crestor managed by pcp   Occluded right carotid on Dopplers 04/2018          Medication Adjustments/Labs and Tests Ordered: Current medicines are reviewed at length with the patient today.  Concerns regarding medicines are outlined above.  Medication changes, Labs and Tests ordered today are listed in the Patient Instructions below. There are no Patient Instructions on file for this visit.   Sumner Boast, PA-C  02/26/2019 12:08 PM    [facility] [address], [place], [place]  [address] Phone: [phone]; Fax: [phone]

## 2019-02-26 ENCOUNTER — Ambulatory Visit (INDEPENDENT_AMBULATORY_CARE_PROVIDER_SITE_OTHER): Payer: Medicare Other | Admitting: Physician Assistant

## 2019-02-26 ENCOUNTER — Telehealth (HOSPITAL_COMMUNITY): Payer: Self-pay

## 2019-02-26 ENCOUNTER — Encounter: Payer: Self-pay | Admitting: Physician Assistant

## 2019-02-26 ENCOUNTER — Telehealth: Payer: Self-pay | Admitting: *Deleted

## 2019-02-26 ENCOUNTER — Encounter (HOSPITAL_COMMUNITY)
Admission: RE | Admit: 2019-02-26 | Discharge: 2019-02-26 | Disposition: A | Discharge status: Home / Self Care | Payer: Self-pay | Source: Ambulatory Visit | Attending: Interventional Cardiology | Admitting: Interventional Cardiology

## 2019-02-26 ENCOUNTER — Ambulatory Visit (HOSPITAL_COMMUNITY): Payer: Medicare Other

## 2019-02-26 ENCOUNTER — Telehealth (HOSPITAL_COMMUNITY): Payer: Self-pay | Admitting: Family Medicine

## 2019-02-26 VITALS — BP 118/58 | HR 61 | Ht 71.0 in | Wt 229.4 lb

## 2019-02-26 DIAGNOSIS — I2510 Atherosclerotic heart disease of native coronary artery without angina pectoris: Secondary | ICD-10-CM | POA: Diagnosis not present

## 2019-02-26 DIAGNOSIS — I10 Essential (primary) hypertension: Secondary | ICD-10-CM | POA: Diagnosis not present

## 2019-02-26 DIAGNOSIS — I6521 Occlusion and stenosis of right carotid artery: Secondary | ICD-10-CM | POA: Diagnosis not present

## 2019-02-26 DIAGNOSIS — E782 Mixed hyperlipidemia: Secondary | ICD-10-CM

## 2019-02-26 MED ORDER — PROCHLORPERAZINE MALEATE 10 MG PO TABS: 10.0000 mg | ORAL_TABLET | Freq: Four times a day (QID) | ORAL | 1 refills | Status: DC | PRN

## 2019-02-26 MED ORDER — AMLODIPINE BESYLATE 5 MG PO TABS: 5.0000 mg | ORAL_TABLET | Freq: Two times a day (BID) | ORAL | 3 refills | Status: DC

## 2019-02-26 MED ORDER — LIDOCAINE-PRILOCAINE 2.5-2.5 % EX CREA: 1.0000 "application " | TOPICAL_CREAM | CUTANEOUS | 1 refills | Status: DC

## 2019-02-26 NOTE — Telephone Encounter (Signed)
It has been more than a month since his last stent.  Patient wants to have the procedure done and will have him hold Plavix 5 days prior to procedure.  Resume when safe from a bleeding standpoint.  Hopefully, this can be restarted quickly.

## 2019-02-26 NOTE — Telephone Encounter (Signed)
Successful telephone encounter to [name] to follow up on chest pain complaint during this morning virtual CR exercise session. Per [name], the chest pain was under his left breast, 2/10 in discomfort ("felt like a pulled muscle") and lasted 2-3 seconds in duration. Patient continued to walk during discomfort which was self limiting. He did not rest nor need to take nitro. Pain has not returned this morning. He DID verbalize the need to take nitro x 2 this weekend for chest discomfort which he now states was indigestion/heart burn as 2 nitro did not relieve pain but a "large belch" did. He also took nitro x 1 yesterday for chest discomfort. Cardiology is aware of onging chest discomfort and Amlodipin was prescribe 02/11/2019 for symptom control. Patient has appointment today with Ermalinda Barrios, [place] at 11:30 for follow-up and to discuss ongoing chest discomfort. Patient is strongly encouraged to discuss frequent use of Nitro with Ms. Bonnell Public as well as his appropriateness for Virtual Cardiac Rehab. Patient is instructed to follow up with CR Nursing post appointment today. Of Note, it is documented that patient stopped his plavix 02/18/2019 in preparation for liver biopsy however he forgot and does as follows ( stopped plavis 02/21/2019, restarted on 9/14, and stopped again after yesterdays dose). Will route this note to Ms. Bonnell Public. [name] E. Rollene Rotunda, RN, BSN

## 2019-02-26 NOTE — Telephone Encounter (Signed)
Unsuccessful telephone encounter to [name] in response to his incoming telephone call requesting call back. HIPAA compliant VM message left requesting call back to CR department. Per provider response to inbasket message from CR RN, patient is cleared to return to CR virtual exercise. Naz Denunzio E. Rollene Rotunda, RN BSN

## 2019-02-26 NOTE — Telephone Encounter (Signed)
Patient made aware at [place] today

## 2019-02-26 NOTE — Telephone Encounter (Signed)
VM from [place] in IR-not able to do liver biopsy and PAC on 9/21. Could do both on 03/04/19. IF not, liver bx on 9/21 and port on 9/25. Requesting return call before she calls patient. Spoke with patient, and since he is going out of town on 9/23, he wants to have both procedures on 03/04/19 instead. He will continue to hold Plavix and will expect call tomorrow from IR with specific times for 9/22. Called IR and left message that he wants both on 03/04/19.

## 2019-02-26 NOTE — Telephone Encounter (Signed)
-----   Message from Imogene Burn, PA-C sent at 02/26/2019  3:51 PM EDT ----- Yes he can continue rehab! ----- Message ----- From: Benedetto Goad, RN Sent: 02/26/2019   2:00 PM EDT To: Imogene Burn, PA-C  Thank you Selinda Eon! Read your note. Is patient appropriate to continue with virtual cardiac rehab? Just seeking clarification since he continues to log CP on the app.  Luis [name] ----- Message ----- From: Murrell Converse Sent: 02/26/2019  11:59 AM EDT To: Benedetto Goad, RN  Thank you seeing him now ----- Message ----- From: Benedetto Goad, RN Sent: 02/26/2019   8:31 AM EDT To: Imogene Burn, PA-C

## 2019-02-26 NOTE — Patient Instructions (Addendum)
Medication Instructions:  Your physician has recommended you make the following change in your medication:  INCREASE: Amlodipine to 5 mg Twice Daily  Lab work: None Ordered  Testing/Procedures: None Ordered  Follow-Up: With Dr Irish Lack 04/25/19 at 9:00

## 2019-02-27 ENCOUNTER — Telehealth (HOSPITAL_COMMUNITY): Payer: Self-pay | Admitting: Family Medicine

## 2019-02-28 ENCOUNTER — Telehealth (HOSPITAL_COMMUNITY): Payer: Self-pay

## 2019-02-28 ENCOUNTER — Ambulatory Visit (HOSPITAL_COMMUNITY): Payer: Medicare Other

## 2019-02-28 NOTE — Telephone Encounter (Signed)
Successful telephone encounter to Luis [name], per his request for call back. Luis [name] wanted to make sure CR RN understood that he had been given clearance by Cardiology to continue with his Virtual CR exercise sessions. CR RN has reviewed last office visit note and confirmed with patient that he has increased his Norvasc dosage as prescribed. He denies chest discomfort since last encounter. Will continue to monitor through virtual CR app. Lakina Mcintire E. Rollene Rotunda, RN

## 2019-03-03 ENCOUNTER — Ambulatory Visit (HOSPITAL_COMMUNITY): Payer: Medicare Other

## 2019-03-03 ENCOUNTER — Other Ambulatory Visit: Payer: Self-pay | Admitting: Radiology

## 2019-03-04 ENCOUNTER — Ambulatory Visit (HOSPITAL_COMMUNITY)
Admission: RE | Admit: 2019-03-04 | Discharge: 2019-03-04 | Disposition: A | Discharge status: Home / Self Care | Payer: Medicare Other | Source: Ambulatory Visit | Attending: Oncology | Admitting: Oncology

## 2019-03-04 ENCOUNTER — Encounter (HOSPITAL_COMMUNITY): Payer: Self-pay

## 2019-03-04 ENCOUNTER — Other Ambulatory Visit: Payer: Self-pay | Admitting: Oncology

## 2019-03-04 ENCOUNTER — Other Ambulatory Visit: Payer: Self-pay

## 2019-03-04 DIAGNOSIS — C787 Secondary malignant neoplasm of liver and intrahepatic bile duct: Secondary | ICD-10-CM | POA: Diagnosis not present

## 2019-03-04 DIAGNOSIS — C180 Malignant neoplasm of cecum: Secondary | ICD-10-CM | POA: Insufficient documentation

## 2019-03-04 HISTORY — PX: IR US GUIDE BX ASP/DRAIN: IMG2392

## 2019-03-04 HISTORY — PX: IR IMAGING GUIDED PORT INSERTION: IMG5740

## 2019-03-04 LAB — COMPREHENSIVE METABOLIC PANEL
ALT: 47 U/L — ABNORMAL HIGH (ref 0–44)
AST: 57 U/L — ABNORMAL HIGH (ref 15–41)
Albumin: 4.4 g/dL (ref 3.5–5.0)
Alkaline Phosphatase: 130 U/L — ABNORMAL HIGH (ref 38–126)
Anion gap: 8 (ref 5–15)
BUN: 19 mg/dL (ref 8–23)
CO2: 27 mmol/L (ref 22–32)
Calcium: 9.1 mg/dL (ref 8.9–10.3)
Chloride: 101 mmol/L (ref 98–111)
Creatinine, Ser: 0.87 mg/dL (ref 0.61–1.24)
GFR calc Af Amer: >60 mL/min (ref >60)
GFR calc non Af Amer: >60 mL/min (ref >60)
Glucose, Bld: 98 mg/dL (ref 70–99)
Potassium: 4.3 mmol/L (ref 3.5–5.1)
Sodium: 136 mmol/L (ref 135–145)
Total Bilirubin: 0.6 mg/dL (ref 0.3–1.2)
Total Protein: 7.3 g/dL (ref 6.5–8.1)

## 2019-03-04 LAB — CBC
HCT: 46.6 % (ref 39.0–52.0)
Hemoglobin: 15.5 g/dL (ref 13.0–17.0)
MCH: 31.1 pg (ref 26.0–34.0)
MCHC: 33.3 g/dL (ref 30.0–36.0)
MCV: 93.4 fL (ref 80.0–100.0)
Platelets: 132 10*3/uL — ABNORMAL LOW (ref 150–400)
RBC: 4.99 MIL/uL (ref 4.22–5.81)
RDW: 13.6 % (ref 11.5–15.5)
WBC: 6.4 10*3/uL (ref 4.0–10.5)
nRBC: 0 % (ref 0.0–0.2)

## 2019-03-04 LAB — PROTIME-INR
INR: 0.9 (ref 0.8–1.2)
Prothrombin Time: 12.5 seconds (ref 11.4–15.2)

## 2019-03-04 MED ORDER — FENTANYL CITRATE (PF) 100 MCG/2ML IJ SOLN
INTRAMUSCULAR | Status: AC
  Filled 2019-03-04: qty 2

## 2019-03-04 MED ORDER — MIDAZOLAM HCL 2 MG/2ML IJ SOLN
INTRAMUSCULAR | Status: AC
  Filled 2019-03-04: qty 4

## 2019-03-04 MED ORDER — SODIUM CHLORIDE 0.9 % IV SOLN
INTRAVENOUS | Status: DC
  Administered 2019-03-04: 11:00:00 via INTRAVENOUS

## 2019-03-04 MED ORDER — MIDAZOLAM HCL 2 MG/2ML IJ SOLN
INTRAMUSCULAR | Status: AC | PRN
  Administered 2019-03-04: 1 mg via INTRAVENOUS
  Administered 2019-03-04 (×2): 0.5 mg via INTRAVENOUS
  Administered 2019-03-04 (×3): 1 mg via INTRAVENOUS
  Administered 2019-03-04: 0.5 mg via INTRAVENOUS

## 2019-03-04 MED ORDER — LIDOCAINE HCL 1 % IJ SOLN
INTRAMUSCULAR | Status: AC
  Filled 2019-03-04: qty 20

## 2019-03-04 MED ORDER — LIDOCAINE-EPINEPHRINE (PF) 2 %-1:200000 IJ SOLN
INTRAMUSCULAR | Status: AC
  Filled 2019-03-04: qty 20

## 2019-03-04 MED ORDER — HEPARIN SOD (PORK) LOCK FLUSH 100 UNIT/ML IV SOLN
INTRAVENOUS | Status: AC
  Filled 2019-03-04: qty 5

## 2019-03-04 MED ORDER — HYDROCODONE-ACETAMINOPHEN 5-325 MG PO TABS
1.0000 | ORAL_TABLET | ORAL | Status: DC | PRN
  Administered 2019-03-04: 1 via ORAL
  Filled 2019-03-04: qty 1

## 2019-03-04 MED ORDER — MIDAZOLAM HCL 2 MG/2ML IJ SOLN
INTRAMUSCULAR | Status: AC
  Filled 2019-03-04: qty 2

## 2019-03-04 MED ORDER — CEFAZOLIN SODIUM-DEXTROSE 2-4 GM/100ML-% IV SOLN
INTRAVENOUS | Status: AC
  Filled 2019-03-04: qty 100

## 2019-03-04 MED ORDER — LIDOCAINE-EPINEPHRINE (PF) 1 %-1:200000 IJ SOLN
INTRAMUSCULAR | Status: AC | PRN
  Administered 2019-03-04: 10 mL

## 2019-03-04 MED ORDER — FENTANYL CITRATE (PF) 100 MCG/2ML IJ SOLN
INTRAMUSCULAR | Status: AC | PRN
  Administered 2019-03-04 (×2): 50 ug via INTRAVENOUS

## 2019-03-04 MED ORDER — LIDOCAINE HCL (PF) 1 % IJ SOLN
INTRAMUSCULAR | Status: AC | PRN
  Administered 2019-03-04 (×2): 10 mL

## 2019-03-04 MED ORDER — HEPARIN SOD (PORK) LOCK FLUSH 100 UNIT/ML IV SOLN
INTRAVENOUS | Status: AC | PRN
  Administered 2019-03-04: 500 [IU] via INTRAVENOUS

## 2019-03-04 MED ORDER — CEFAZOLIN SODIUM-DEXTROSE 2-4 GM/100ML-% IV SOLN
2.0000 g | INTRAVENOUS | Status: AC
  Administered 2019-03-04: 2 g via INTRAVENOUS

## 2019-03-04 NOTE — Discharge Instructions (Signed)
Liver Biopsy, Care After °These instructions give you information on caring for yourself after your procedure. Your doctor may also give you more specific instructions. Call your doctor if you have any problems or questions after your procedure. °What can I expect after the procedure? °After the procedure, it is common to have: °· Pain and soreness where the biopsy was done. °· Bruising around the area where the biopsy was done. °· Sleepiness and be tired for a few days. °Follow these instructions at home: °Medicines °· Take over-the-counter and prescription medicines only as told by your doctor. °· If you were prescribed an antibiotic medicine, take it as told by your doctor. Do not stop taking the antibiotic even if you start to feel better. °· Do not take medicines such as aspirin and ibuprofen. These medicines can thin your blood. Do not take these medicines unless your doctor tells you to take them. °· If you are taking prescription pain medicine, take actions to prevent or treat constipation. Your doctor may recommend that you: °? Drink enough fluid to keep your pee (urine) clear or pale yellow. °? Take over-the-counter or prescription medicines. °? Eat foods that are high in fiber, such as fresh fruits and vegetables, whole grains, and beans. °? Limit foods that are high in fat and processed sugars, such as fried and sweet foods. °Caring for your cut °· Follow instructions from your doctor about how to take care of your cuts from surgery (incisions). Make sure you: °? Wash your hands with soap and water before you change your bandage (dressing). If you cannot use soap and water, use hand sanitizer. °? Change your bandage as told by your doctor. °? Leave stitches (sutures), skin glue, or skin tape (adhesive) strips in place. They may need to stay in place for 2 weeks or longer. If tape strips get loose and curl up, you may trim the loose edges. Do not remove tape strips completely unless your doctor says it is  okay. °· Check your cuts every day for signs of infection. Check for: °? Redness, swelling, or more pain. °? Fluid or blood. °? Pus or a bad smell. °? Warmth. °· Do not take baths, swim, or use a hot tub until your doctor says it is okay to do so. °Activity ° °· Rest at home for 1-2 days or as told by your doctor. °? Avoid sitting for a long time without moving. Get up to take short walks every 1-2 hours. °· Return to your normal activities as told by your doctor. Ask what activities are safe for you. °· Do not do these things in the first 24 hours: °? Drive. °? Use machinery. °? Take a bath or shower. °· Do not lift more than 10 pounds (4.5 kg) or play contact sports for the first 2 weeks. °General instructions ° °· Do not drink alcohol in the first week after the procedure. °· Have someone stay with you for at least 24 hours after the procedure. °· Get your test results. Ask your doctor or the department that is doing the test: °? When will my results be ready? °? How will I get my results? °? What are my treatment options? °? What other tests do I need? °? What are my next steps? °· Keep all follow-up visits as told by your doctor. This is important. °Contact a doctor if: °· A cut bleeds and leaves more than just a small spot of blood. °· A cut is red, puffs up (  swells), or hurts more than before.  Fluid or something else comes from a cut.  A cut smells bad.  You have a fever or chills. Get help right away if:  You have swelling, bloating, or pain in your belly (abdomen).  You get dizzy or faint.  You have a rash.  You feel sick to your stomach (nauseous) or throw up (vomit).  You have trouble breathing, feel short of breath, or feel faint.  Your chest hurts.  You have problems talking or seeing.  You have trouble with your balance or moving your arms or legs. Summary  After the procedure, it is common to have pain, soreness, bruising, and tiredness.  Your doctor will tell you how to  take care of yourself at home. Change your bandage, take your medicines, and limit your activities as told by your doctor.  Call your doctor if you have symptoms of infection. Get help right away if your belly swells, your cut bleeds a lot, or you have trouble talking or breathing. This information is not intended to replace advice given to you by your health care provider. Make sure you discuss any questions you have with your health care provider. Document Released: 03/07/2008 Document Revised: 06/08/2017 Document Reviewed: 06/08/2017 Elsevier Patient Education  [place] An implanted port is a device that is placed under the skin. It is usually placed in the chest. The device can be used to give IV medicine, to take blood, or for dialysis. You may have an implanted port if:  You need IV medicine that would be irritating to the small veins in your hands or arms.  You need IV medicines, such as antibiotics, for a long period of time.  You need IV nutrition for a long period of time.  You need dialysis. Having a port means that your health care provider will not need to use the veins in your arms for these procedures. You may have fewer limitations when using a port than you would if you used other types of long-term IVs, and you will likely be able to return to normal activities after your incision heals. An implanted port has two main parts:  Reservoir. The reservoir is the part where a needle is inserted to give medicines or draw blood. The reservoir is round. After it is placed, it appears as a small, raised area under your skin.  Catheter. The catheter is a thin, flexible tube that connects the reservoir to a vein. Medicine that is inserted into the reservoir goes into the catheter and then into the vein. How is my port accessed? To access your port:  A numbing cream may be placed on the skin over the port site.  Your health care provider will put  on a mask and sterile gloves.  The skin over your port will be cleaned carefully with a germ-killing soap and allowed to dry.  Your health care provider will gently pinch the port and insert a needle into it.  Your health care provider will check for a blood return to make sure the port is in the vein and is not clogged.  If your port needs to remain accessed to get medicine continuously (constant infusion), your health care provider will place a clear bandage (dressing) over the needle site. The dressing and needle will need to be changed every week, or as told by your health care provider. What is flushing? Flushing helps keep the port from getting clogged. Follow  instructions from your health care provider about how and when to flush the port. Ports are usually flushed with saline solution or a medicine called heparin. The need for flushing will depend on how the port is used:  If the port is only used from time to time to give medicines or draw blood, the port may need to be flushed: ? Before and after medicines have been given. ? Before and after blood has been drawn. ? As part of routine maintenance. Flushing may be recommended every 4-6 weeks.  If a constant infusion is running, the port may not need to be flushed.  Throw away any syringes in a disposal container that is meant for sharp items (sharps container). You can buy a sharps container from a pharmacy, or you can make one by using an empty hard plastic bottle with a cover. How long will my port stay implanted? The port can stay in for as long as your health care provider thinks it is needed. When it is time for the port to come out, a surgery will be done to remove it. The surgery will be similar to the procedure that was done to put the port in. Follow these instructions at home:   Flush your port as told by your health care provider.  If you need an infusion over several days, follow instructions from your health care  provider about how to take care of your port site. Make sure you: ? Wash your hands with soap and water before you change your dressing. If soap and water are not available, use alcohol-based hand sanitizer. ? Change your dressing as told by your health care provider. ? Place any used dressings or infusion bags into a plastic bag. Throw that bag in the trash. ? Keep the dressing that covers the needle clean and dry. Do not get it wet. ? Do not use scissors or sharp objects near the tube. ? Keep the tube clamped, unless it is being used.  Check your port site every day for signs of infection. Check for: ? Redness, swelling, or pain. ? Fluid or blood. ? Pus or a bad smell.  Protect the skin around the port site. ? Avoid wearing bra straps that rub or irritate the site. ? Protect the skin around your port from seat belts. Place a soft pad over your chest if needed.  Bathe or shower as told by your health care provider. The site may get wet as long as you are not actively receiving an infusion.  Return to your normal activities as told by your health care provider. Ask your health care provider what activities are safe for you.  Carry a medical alert card or wear a medical alert bracelet at all times. This will let health care providers know that you have an implanted port in case of an emergency. Get help right away if:  You have redness, swelling, or pain at the port site.  You have fluid or blood coming from your port site.  You have pus or a bad smell coming from the port site.  You have a fever. Summary  Implanted ports are usually placed in the chest for long-term IV access.  Follow instructions from your health care provider about flushing the port and changing bandages (dressings).  Take care of the area around your port by avoiding clothing that puts pressure on the area, and by watching for signs of infection.  Protect the skin around your port from seat belts.  Place a  soft pad over your chest if needed.  Get help right away if you have a fever or you have redness, swelling, pain, drainage, or a bad smell at the port site. This information is not intended to replace advice given to you by your health care provider. Make sure you discuss any questions you have with your health care provider. Document Released: 05/29/2005 Document Revised: 09/20/2018 Document Reviewed: 07/01/2016 Elsevier Patient Education  [name]. Moderate Conscious Sedation, Adult, Care After These instructions provide you with information about caring for yourself after your procedure. Your health care provider may also give you more specific instructions. Your treatment has been planned according to current medical practices, but problems sometimes occur. Call your health care provider if you have any problems or questions after your procedure. What can I expect after the procedure? After your procedure, it is common:  To feel sleepy for several hours.  To feel clumsy and have poor balance for several hours.  To have poor judgment for several hours.  To vomit if you eat too soon. Follow these instructions at home: For at least 24 hours after the procedure:   Do not: ? Participate in activities where you could fall or become injured. ? Drive. ? Use heavy machinery. ? Drink alcohol. ? Take sleeping pills or medicines that cause drowsiness. ? Make important decisions or sign legal documents. ? Take care of children on your own.  Rest. Eating and drinking  Follow the diet recommended by your health care provider.  If you vomit: ? Drink water, juice, or soup when you can drink without vomiting. ? Make sure you have little or no nausea before eating solid foods. General instructions  Have a responsible adult stay with you until you are awake and alert.  Take over-the-counter and prescription medicines only as told by your health care provider.  If you smoke, do  not smoke without supervision.  Keep all follow-up visits as told by your health care provider. This is important. Contact a health care provider if:  You keep feeling nauseous or you keep vomiting.  You feel light-headed.  You develop a rash.  You have a fever. Get help right away if:  You have trouble breathing. This information is not intended to replace advice given to you by your health care provider. Make sure you discuss any questions you have with your health care provider. Document Released: 03/19/2013 Document Revised: 05/11/2017 Document Reviewed: 09/18/2015 Elsevier Patient Education  2020 [employer].

## 2019-03-04 NOTE — H&P (Signed)
Chief Complaint: Patient was seen in consultation today for image guided liver lesion biopsy and port placement.  Referring Physician(s): Ladell Pier  Supervising Physician: Markus Daft  Patient Status: [facility] - Out-pt  History of Present Illness: Luis [name] is a 78 y.o. male with a past medical history significant for sarcoidosis, anemia, HTN, HLD, CAD, diverticulosis, basal cell carcinoma, prostate cancer and colon cancer s/p right colectomy 07/26/17 followed by Dr. Benay Spice who presents today for a liver lesion biopsy and port placement. Mr. Zirkle has been followed by oncology since 2019 after his diagnosis of colon cancer, he was seen by Dr. Benay Spice on 02/11/19 where the results of his CT chest/abd/pelvis dated 02/07/19 were discussed. CT chest/abd/pelvis with contrast had showed development of bilateral hepatic metastasis without extrahepatic metastatic disease in the abdomen or pelvis. After discussion of these results patient was planned to start chemotherapy and undergo a liver lesion biopsy for tissue confirmation. He was scheduled for a liver lesion biopsy in IR however this was rescheduled for today as his Plavix was not held.   Patient reports he feels well overall, he read a lot about ports and liver biopsies so he feels he has a very good understanding of what we are doing today. He reports he had a lung biopsy many years ago which was very painful and he is hoping these procedures will not be painful like that.   Past Medical History:  Diagnosis Date  . Adenocarcinoma of cecum ([place]) 06/21/2017  . Arthritis    "mid back; hands; knees" (02/24/2015)  . Basal cell carcinoma    left shoulder; mid chest; right eyelid (02/24/2015)  . CAD (coronary artery disease)    a. 08/2014 Cath/PCI: LM nl, LAD 30p, D1 95 (2.25x12 Resolute Integrity DES), LCX small, RI 100 (attempted PCI) - branches fill via L->L collats, RCA dominant, nl, RPDA/PLA nl, EF 60^. b. 02/24/2015 PCI CTO of Ramus DES  x2.  . Chronic bronchitis (HCC)    hx  . Diverticulosis 05/2005  . Elevated lipase   . Fuchs' corneal dystrophy   . History of adenomatous polyp of colon 05/2005   8 mm adenoma  . History of blood transfusion    "related to some of my surgeries"  . Hyperlipidemia   . Hypertension   . Iron deficiency anemia due to chronic blood loss 06/21/2017  . Prostate cancer ([place]) 2011   S/P seed implant  . Sarcoidosis   . Thrombocytopenia ([place])    a. Noted on prior labs, unclear of what w/u done.  . Trifascicular block     Past Surgical History:  Procedure Laterality Date  . BASAL CELL CARCINOMA EXCISION Left    shoulder  . CARDIAC CATHETERIZATION N/A 02/24/2015   Procedure: Coronary/Bypass Graft CTO Intervention;  Surgeon: Jettie Booze, MD;  Location: [facility];  Service: Cardiovascular;  Laterality: N/A;  . CARDIAC CATHETERIZATION  02/24/2015   Procedure: Coronary/Graft Atherectomy;  Surgeon: Jettie Booze, MD;  Location: [place] [facility];  Service: Cardiovascular;;  . CATARACT EXTRACTION W/ INTRAOCULAR LENS  IMPLANT, BILATERAL Bilateral ~ 2011-2012  . COLONOSCOPY W/ POLYPECTOMY  06/08/2005 and 10/18/2010   8 mm adenoma 2006, none 2012. Diverticulosis and internal hemorrhoids.  . CORNEAL TRANSPLANT Bilateral ~ 2011-2012   "@ same time as cataract OR"  . CORONARY ANGIOPLASTY WITH STENT PLACEMENT  08/2014; 02/24/2015   "1 stent + 1 stent"  . CORONARY STENT INTERVENTION N/A 01/02/2019   Procedure: CORONARY STENT INTERVENTION;  Surgeon:  Jettie Booze, MD;  Location: [place] [facility];  Service: Cardiovascular;  Laterality: N/A;  . EYE SURGERY    . FINGER SURGERY Right 2014   "reattached middle finger"  . INSERTION PROSTATE RADIATION SEED  ~ 2011  . LAPAROSCOPIC CHOLECYSTECTOMY  2015  . LAPAROSCOPIC PARTIAL COLECTOMY N/A 07/26/2017   Procedure: LAPAROSCOPIC PARTIAL COLECTOMY, EXCISION SCROTAL CYST;  Surgeon: Greer Pickerel, MD;  Location: [facility];  Service: General;   Laterality: N/A;  . LEFT HEART CATH AND CORONARY ANGIOGRAPHY N/A 01/02/2019   Procedure: LEFT HEART CATH AND CORONARY ANGIOGRAPHY;  Surgeon: Jettie Booze, MD;  Location: [place] [facility];  Service: Cardiovascular;  Laterality: N/A;  . LEFT HEART CATHETERIZATION WITH CORONARY ANGIOGRAM N/A 08/12/2014   Procedure: LEFT HEART CATHETERIZATION WITH CORONARY ANGIOGRAM;  Surgeon: Blane Ohara, MD;  Location: [facility] CATH LAB;  Service: Cardiovascular;  Laterality: N/A;  . LYMPH NODE BIOPSY     mid chest  . MOHS SURGERY Right ~ 2011   "eyelid; for basal cell"  . THOROCOTOMY WITH LOBECTOMY Right ~ 1991   partial removal right lung  . TONSILLECTOMY  ~ 1950    Allergies: Patient has no known allergies.  Medications: Prior to Admission medications   Medication Sig Start Date End Date Taking? Authorizing Provider  amLODipine (NORVASC) 5 MG tablet Take 1 tablet (5 mg total) by mouth 2 (two) times daily. 02/26/19   Imogene Burn, PA-C  aspirin EC 81 MG tablet Take 81 mg by mouth daily.    [provider]  CANNABIDIOL PO Take 12 drops by mouth 2 (two) times a day. CBD OIL    [provider]  Cholecalciferol (VITAMIN D3) 2000 units capsule Take 2,000 Units by mouth every evening.     [provider]  clopidogrel (PLAVIX) 75 MG tablet TAKE 1 TABLET (75 MG TOTAL) BY MOUTH DAILY WITH BREAKFAST. 01/24/19   Jettie Booze, MD  clotrimazole (LOTRIMIN) 1 % cream Apply 1 application topically 3 (three) times daily as needed (skin irritation/rash).    [provider]  lidocaine-prilocaine (EMLA) cream Apply 1 application topically as directed. Apply to port 1 hour prior to stick and cover with plastic wrap 02/26/19   Ladell Pier, MD  metoprolol tartrate (LOPRESSOR) 25 MG tablet TAKE 0.5 TABLETS BY MOUTH TWICE A DAY 12/24/18   Jettie Booze, MD  Multiple Vitamin (MULTIVITAMIN WITH MINERALS) TABS tablet Take 1 tablet by mouth every evening. One-A-Day for Men  50+    [provider]  nitroGLYCERIN (NITROSTAT) 0.4 MG SL tablet Place 1 tablet (0.4 mg total) under the tongue every 5 (five) minutes as needed for chest pain. X 3 doses 12/19/17   Jettie Booze, MD  prochlorperazine (COMPAZINE) 10 MG tablet Take 1 tablet (10 mg total) by mouth every 6 (six) hours as needed. 02/26/19   Ladell Pier, MD  rosuvastatin (CRESTOR) 20 MG tablet TAKE 1 TABLET BY MOUTH EVERY DAY 01/24/19   Jettie Booze, MD  sildenafil (REVATIO) 20 MG tablet Take 60 mg by mouth as needed (for ED).     [provider]  silodosin (RAPAFLO) 8 MG CAPS capsule Take 8 mg by mouth daily as needed (for urination).     [provider]  traMADol (ULTRAM) 50 MG tablet Take 100 mg by mouth 2 (two) times a day.     [provider]  triamcinolone cream (KENALOG) 0.5 % Apply 1 application topically 3 (three) times daily as  needed (rash/skin irritation.). APPLY TOPICALLY 2-3 TIMES A DAY TO SCAR FOR 2-3 WEEKS THEN AS NEEDED 03/29/18   [provider]     Family History  Problem Relation Age of Onset  . Heart disease Father 48       Died from "hardening of the arteries" age 22  . Coronary artery disease Sister 64  . Heart attack Sister   . Colon cancer Neg Hx   . Throat cancer Neg Hx   . Pancreatic cancer Neg Hx   . Prostate cancer Neg Hx   . Stroke Neg Hx   . Hypertension Neg Hx     Social History   Socioeconomic History  . Marital status: Married    Spouse name: Not on file  . Number of children: 2  . Years of education: Not on file  . Highest education level: Not on file  Occupational History  . Occupation: Unemployed  Social Needs  . Financial resource strain: Not on file  . Food insecurity    Worry: Not on file    Inability: Not on file  . Transportation needs    Medical: Not on file    Non-medical: Not on file  Tobacco Use  . Smoking status: Former Smoker    Packs/day: 0.00    Years: 59.00    Pack years: 0.00     Types: Cigars  . Smokeless tobacco: Never Used  . Tobacco comment: 02/24/2015 "quit cigarettes ~ 2000 ago but smokes cigars ionce/month"  Substance and Sexual Activity  . Alcohol use: Yes    Alcohol/week: 2.0 standard drinks    Types: 1 Glasses of wine, 1 Standard drinks or equivalent per week    Comment: Drinks maybe 1-2 drinks a week or less  . Drug use: No  . Sexual activity: Not on file  Lifestyle  . Physical activity    Days per week: Not on file    Minutes per session: Not on file  . Stress: Not on file  Relationships  . Social Herbalist on phone: Not on file    Gets together: Not on file    Attends religious service: Not on file    Active member of club or organization: Not on file    Attends meetings of clubs or organizations: Not on file    Relationship status: Not on file  Other Topics Concern  . Not on file  Social History Narrative   Lives at home wife.       Review of Systems: A 12 point ROS discussed and pertinent positives are indicated in the HPI above.  All other systems are negative.  Review of Systems  Constitutional: Negative for chills and fever.  HENT: Negative for nosebleeds.   Respiratory: Negative for cough and shortness of breath.   Cardiovascular: Negative for chest pain.  Gastrointestinal: Negative for abdominal pain, blood in stool, diarrhea, nausea and vomiting.  Genitourinary: Negative for dysuria and hematuria.  Musculoskeletal: Negative for back pain.  Skin: Negative for rash.  Neurological: Negative for dizziness, syncope and headaches.    Vital Signs: There were no vitals taken for this visit.  Physical Exam Vitals signs reviewed.  Constitutional:      General: He is not in acute distress.    Comments: Pleasant, talkative, good historian.  HENT:     Head: Normocephalic.     Mouth/Throat:     Mouth: Mucous membranes are moist.     Pharynx: Oropharynx is clear. No oropharyngeal  exudate or posterior oropharyngeal  erythema.  Cardiovascular:     Rate and Rhythm: Normal rate and regular rhythm.  Pulmonary:     Effort: Pulmonary effort is normal.     Breath sounds: Normal breath sounds.  Abdominal:     General: Bowel sounds are normal. There is no distension.     Palpations: Abdomen is soft.     Tenderness: There is no abdominal tenderness.  Skin:    General: Skin is warm and dry.  Neurological:     Mental Status: He is alert and oriented to person, place, and time.  Psychiatric:        Mood and Affect: Mood normal.        Behavior: Behavior normal.        Thought Content: Thought content normal.        Judgment: Judgment normal.      MD Evaluation Airway: WNL Heart: WNL Abdomen: WNL Chest/ Lungs: WNL ASA  Classification: 3 Mallampati/Airway Score: Two   Imaging: Ct Chest W Contrast  Result Date: 02/07/2019 CLINICAL DATA:  Colon cancer. Rising CEA. Restaging. Prostate cancer 9 years ago. Right thoracotomy for sarcoidosis. EXAM: CT CHEST, ABDOMEN, AND PELVIS WITH CONTRAST TECHNIQUE: Multidetector CT imaging of the chest, abdomen and pelvis was performed following the standard protocol during bolus administration of intravenous contrast. CONTRAST:  165mL OMNIPAQUE IOHEXOL 300 MG/ML  SOLN COMPARISON:  06/22/2017 FINDINGS: CT CHEST FINDINGS Cardiovascular: Aortic atherosclerosis. Tortuous thoracic aorta. Mild cardiomegaly, without pericardial effusion. Dense coronary artery atherosclerosis. No central pulmonary embolism, on this non-dedicated study. Mediastinum/Nodes: No supraclavicular adenopathy. Calcified mediastinal and hilar nodes are likely related to the clinical history of sarcoidosis. Lungs/Pleura: No pleural fluid. Right upper lobe wedge resection. Bilateral multiple calcified granulomas. Mild progression of upper lobe predominant areas of peribronchovascular nodularity, architectural distortion, and consolidation. The most masslike is in the posterior right upper lobe at image 50/4.  Given this underlying diffuse process, no convincing evidence of pulmonary metastasis. Musculoskeletal: No acute osseous abnormality. Right thoracotomy changes. CT ABDOMEN PELVIS FINDINGS Hepatobiliary: Development of bilateral hepatic metastasis. Segment 7/8 5.4 x 5.2 cm lesion image 54/2. Segment 6 mass measures 5.8 x 5.6 cm on 66/2. Subtle segment 2 lesion measures 3.8 x 3.4 cm on 50/2. Cholecystectomy, without biliary ductal dilatation. Pancreas: Fatty replacement valve in the head and uncinate process. Spleen: Normal in size, without focal abnormality. Normal in size, without focal abnormality. Adrenals/Urinary Tract: Normal adrenal glands. Inter and lower pole left larger than right renal sinus cysts. Subcentimeter upper pole right renal lesion is too small to characterize but favored to represent a cyst or tiny angiomyolipoma. No hydronephrosis. Normal urinary bladder. Stomach/Bowel: Normal stomach, without wall thickening. Tiny periampullary duodenal diverticulum. Otherwise normal small bowel. Extensive colonic diverticulosis. Partial right hemicolectomy. No locally recurrent disease. Vascular/Lymphatic: Aortic and branch vessel atherosclerosis. No abdominopelvic adenopathy. Reproductive: Radiation seeds in the prostate. Other: No significant free fluid. Musculoskeletal: Left superior pubic ramus sclerotic lesion is similar and likely a bone island. Partial degenerative fusion of the bilateral sacroiliac joints. IMPRESSION: 1. Development of bilateral hepatic metastasis. 2. No extrahepatic metastatic disease in the abdomen or pelvis. 3. Mild progression of sarcoidosis since 06/22/2017. Given the underlying chronic findings, no evidence of thoracic metastasis. 4. Coronary artery atherosclerosis. Aortic Atherosclerosis (ICD10-I70.0). Electronically Signed   By: Abigail Miyamoto M.D.   On: 02/07/2019 16:07   Ct Abdomen Pelvis W Contrast  Result Date: 02/07/2019 CLINICAL DATA:  Colon cancer. Rising CEA.  Restaging. Prostate cancer 9 years  ago. Right thoracotomy for sarcoidosis. EXAM: CT CHEST, ABDOMEN, AND PELVIS WITH CONTRAST TECHNIQUE: Multidetector CT imaging of the chest, abdomen and pelvis was performed following the standard protocol during bolus administration of intravenous contrast. CONTRAST:  181mL OMNIPAQUE IOHEXOL 300 MG/ML  SOLN COMPARISON:  06/22/2017 FINDINGS: CT CHEST FINDINGS Cardiovascular: Aortic atherosclerosis. Tortuous thoracic aorta. Mild cardiomegaly, without pericardial effusion. Dense coronary artery atherosclerosis. No central pulmonary embolism, on this non-dedicated study. Mediastinum/Nodes: No supraclavicular adenopathy. Calcified mediastinal and hilar nodes are likely related to the clinical history of sarcoidosis. Lungs/Pleura: No pleural fluid. Right upper lobe wedge resection. Bilateral multiple calcified granulomas. Mild progression of upper lobe predominant areas of peribronchovascular nodularity, architectural distortion, and consolidation. The most masslike is in the posterior right upper lobe at image 50/4. Given this underlying diffuse process, no convincing evidence of pulmonary metastasis. Musculoskeletal: No acute osseous abnormality. Right thoracotomy changes. CT ABDOMEN PELVIS FINDINGS Hepatobiliary: Development of bilateral hepatic metastasis. Segment 7/8 5.4 x 5.2 cm lesion image 54/2. Segment 6 mass measures 5.8 x 5.6 cm on 66/2. Subtle segment 2 lesion measures 3.8 x 3.4 cm on 50/2. Cholecystectomy, without biliary ductal dilatation. Pancreas: Fatty replacement valve in the head and uncinate process. Spleen: Normal in size, without focal abnormality. Normal in size, without focal abnormality. Adrenals/Urinary Tract: Normal adrenal glands. Inter and lower pole left larger than right renal sinus cysts. Subcentimeter upper pole right renal lesion is too small to characterize but favored to represent a cyst or tiny angiomyolipoma. No hydronephrosis. Normal urinary  bladder. Stomach/Bowel: Normal stomach, without wall thickening. Tiny periampullary duodenal diverticulum. Otherwise normal small bowel. Extensive colonic diverticulosis. Partial right hemicolectomy. No locally recurrent disease. Vascular/Lymphatic: Aortic and branch vessel atherosclerosis. No abdominopelvic adenopathy. Reproductive: Radiation seeds in the prostate. Other: No significant free fluid. Musculoskeletal: Left superior pubic ramus sclerotic lesion is similar and likely a bone island. Partial degenerative fusion of the bilateral sacroiliac joints. IMPRESSION: 1. Development of bilateral hepatic metastasis. 2. No extrahepatic metastatic disease in the abdomen or pelvis. 3. Mild progression of sarcoidosis since 06/22/2017. Given the underlying chronic findings, no evidence of thoracic metastasis. 4. Coronary artery atherosclerosis. Aortic Atherosclerosis (ICD10-I70.0). Electronically Signed   By: Abigail Miyamoto M.D.   On: 02/07/2019 16:07    Labs:  CBC: Recent Labs    12/25/18 1306  WBC 5.7  HGB 15.5  HCT 45.7  PLT 142*    COAGS: No results for input(s): INR, APTT in the last 8760 hours.  BMP: Recent Labs    12/25/18 1306 02/07/19 1004  NA 138 138  K 4.8 4.5  CL 99 102  CO2 25 28  GLUCOSE 84 98  BUN 15 15  CALCIUM 9.2 9.2  CREATININE 0.89 0.94  GFRNONAA 82 >60  GFRAA 95 >60    LIVER FUNCTION TESTS: No results for input(s): BILITOT, AST, ALT, ALKPHOS, PROT, ALBUMIN in the last 8760 hours.  TUMOR MARKERS: No results for input(s): AFPTM, CEA, CA199, CHROMGRNA in the last 8760 hours.  Assessment and Plan:  78 y/o M with history of colon cancer s/p right colectomy (07/2017) followed by Dr. Benay Spice noted to have bilateral hepatic metastasis on CT chest/abd/pelvis dated 02/07/19 who presents today for a liver lesion biopsy + port placement.   Patient has been NPO since 6 am today, last does of Plavix 02/25/19. Afebrile, WBC 6.4, hgb 15.5, plt 132, creatinine 0.87, INR  0.9  Risks and benefits of liver lesion biopsy was discussed with the patient and/or patient's family including, but not limited  to bleeding, infection, damage to adjacent structures or low yield requiring additional tests.  Risks and benefits of image guided port-a-catheter placement were discussed with the patient including, but not limited to bleeding, infection, pneumothorax, or fibrin sheath development and need for additional procedures.  All of the patient's questions were answered, patient is agreeable to proceed.  Consent signed and in chart.  Thank you for this interesting consult.  I greatly enjoyed meeting KELLEE KNEECE and look forward to participating in their care.  A copy of this report was sent to the requesting provider on this date.  Electronically Signed: Joaquim Nam, PA-C 03/04/2019, 10:49 AM   I spent a total of  15 Minutes in face to face in clinical consultation, greater than 50% of which was counseling/coordinating care for liver lesion biopsy + port placement.

## 2019-03-04 NOTE — Sedation Documentation (Signed)
Port a cath placement completed. Patient being prepped for liver biopsy.

## 2019-03-04 NOTE — Procedures (Signed)
Interventional Radiology Procedure:   Indications: New liver lesions  Procedure: Port placement and US guided liver lesion biopsy  Findings: Right jugular port, tip at SVC/RA junction; 3 cores from right hepatic lesion.  Complications: None     EBL: Minimal, less than 10 ml  Plan: Discharge in 3 hours.  Keep port site and incisions dry for at least 24 hours.     Zanyiah Posten R. Anselm Pancoast, MD  Pager: [phone]

## 2019-03-04 NOTE — Sedation Documentation (Signed)
Liver biopsy procedure begun.

## 2019-03-05 ENCOUNTER — Ambulatory Visit (HOSPITAL_COMMUNITY): Payer: Medicare Other

## 2019-03-05 ENCOUNTER — Encounter: Payer: Self-pay | Admitting: *Deleted

## 2019-03-05 ENCOUNTER — Telehealth: Payer: Self-pay | Admitting: *Deleted

## 2019-03-05 ENCOUNTER — Other Ambulatory Visit: Payer: Self-pay | Admitting: Oncology

## 2019-03-05 LAB — SURGICAL PATHOLOGY

## 2019-03-05 NOTE — Progress Notes (Addendum)
Per Dr. Benay Spice request: email to [facility] Pathology requesting Foundation One testing on case #[id number]. Dx: Stage IV Colon Code: C18.9

## 2019-03-05 NOTE — Telephone Encounter (Signed)
Had biopsy and port placed on 9/22. Asking when to resume ASA and Plavix? Per Dr. Benay Spice: Resume both on 03/06/2019. Notified of Dr. Gearldine Shown answer and he reports he forgot and took it today anyway. Instructed him to call for any bleeding or a hematoma at biopsy site or port site.

## 2019-03-07 ENCOUNTER — Other Ambulatory Visit (HOSPITAL_COMMUNITY): Payer: Medicare Other

## 2019-03-07 ENCOUNTER — Ambulatory Visit (HOSPITAL_COMMUNITY): Payer: Medicare Other

## 2019-03-07 ENCOUNTER — Other Ambulatory Visit: Payer: Self-pay | Admitting: *Deleted

## 2019-03-07 ENCOUNTER — Telehealth: Payer: Self-pay | Admitting: Oncology

## 2019-03-07 ENCOUNTER — Other Ambulatory Visit: Payer: Self-pay | Admitting: Oncology

## 2019-03-07 DIAGNOSIS — C180 Malignant neoplasm of cecum: Secondary | ICD-10-CM

## 2019-03-07 NOTE — Progress Notes (Signed)
Per Dr. Benay Spice: Needs CBC and CMP on 03/10/19. Order placed and scheduling message sent.

## 2019-03-07 NOTE — Telephone Encounter (Signed)
Confirmed 9/28 add on lab with patient.

## 2019-03-10 ENCOUNTER — Encounter: Payer: Self-pay | Admitting: Nurse Practitioner

## 2019-03-10 ENCOUNTER — Ambulatory Visit (HOSPITAL_COMMUNITY): Payer: Medicare Other

## 2019-03-10 ENCOUNTER — Other Ambulatory Visit: Payer: Self-pay

## 2019-03-10 ENCOUNTER — Inpatient Hospital Stay (HOSPITAL_BASED_OUTPATIENT_CLINIC_OR_DEPARTMENT_OTHER): Payer: Medicare Other | Admitting: Nurse Practitioner

## 2019-03-10 ENCOUNTER — Inpatient Hospital Stay: Payer: Medicare Other

## 2019-03-10 VITALS — BP 140/71 | HR 59 | Temp 98.3°F | Resp 17 | Ht 71.0 in | Wt 227.2 lb

## 2019-03-10 DIAGNOSIS — C180 Malignant neoplasm of cecum: Secondary | ICD-10-CM | POA: Diagnosis not present

## 2019-03-10 DIAGNOSIS — Z5111 Encounter for antineoplastic chemotherapy: Secondary | ICD-10-CM | POA: Diagnosis not present

## 2019-03-10 LAB — CBC WITH DIFFERENTIAL (CANCER CENTER ONLY)
Abs Immature Granulocytes: 0.01 10*3/uL (ref 0.00–0.07)
Basophils Absolute: 0 10*3/uL (ref 0.0–0.1)
Basophils Relative: 1 %
Eosinophils Absolute: 0.3 10*3/uL (ref 0.0–0.5)
Eosinophils Relative: 5 %
HCT: 44.5 % (ref 39.0–52.0)
Hemoglobin: 14.9 g/dL (ref 13.0–17.0)
Immature Granulocytes: 0 %
Lymphocytes Relative: 10 %
Lymphs Abs: 0.6 10*3/uL — ABNORMAL LOW (ref 0.7–4.0)
MCH: 30.8 pg (ref 26.0–34.0)
MCHC: 33.5 g/dL (ref 30.0–36.0)
MCV: 91.9 fL (ref 80.0–100.0)
Monocytes Absolute: 0.7 10*3/uL (ref 0.1–1.0)
Monocytes Relative: 11 %
Neutro Abs: 4.4 10*3/uL (ref 1.7–7.7)
Neutrophils Relative %: 73 %
Platelet Count: 145 10*3/uL — ABNORMAL LOW (ref 150–400)
RBC: 4.84 MIL/uL (ref 4.22–5.81)
RDW: 13.6 % (ref 11.5–15.5)
WBC Count: 6 10*3/uL (ref 4.0–10.5)
nRBC: 0 % (ref 0.0–0.2)

## 2019-03-10 LAB — CMP (CANCER CENTER ONLY)
ALT: 49 U/L — ABNORMAL HIGH (ref 0–44)
AST: 53 U/L — ABNORMAL HIGH (ref 15–41)
Albumin: 4.2 g/dL (ref 3.5–5.0)
Alkaline Phosphatase: 132 U/L — ABNORMAL HIGH (ref 38–126)
Anion gap: 8 (ref 5–15)
BUN: 18 mg/dL (ref 8–23)
CO2: 28 mmol/L (ref 22–32)
Calcium: 9.4 mg/dL (ref 8.9–10.3)
Chloride: 101 mmol/L (ref 98–111)
Creatinine: 0.97 mg/dL (ref 0.61–1.24)
GFR, Est AFR Am: >60 mL/min (ref >60)
GFR, Estimated: >60 mL/min (ref >60)
Glucose, Bld: 104 mg/dL — ABNORMAL HIGH (ref 70–99)
Potassium: 4.9 mmol/L (ref 3.5–5.1)
Sodium: 137 mmol/L (ref 135–145)
Total Bilirubin: 0.6 mg/dL (ref 0.3–1.2)
Total Protein: 7 g/dL (ref 6.5–8.1)

## 2019-03-10 NOTE — Progress Notes (Addendum)
  [facility] OFFICE PROGRESS NOTE   Diagnosis: Colon cancer  INTERVAL HISTORY:   Mr. Oleson returns as scheduled.  He feels well overall.  He has a good appetite and good energy level.  No pre-existing neuropathy symptoms.  Objective:  Vital signs in last 24 hours:  Blood pressure 140/71, pulse (!) 59, temperature 98.3 F (36.8 C), temperature source Oral, resp. rate 17, height _0  (1.803 m), weight 227 lb 3.2 oz (103.1 kg), SpO2 99 %.    GI: Abdomen soft and nontender.  No hepatomegaly.  Soft, reducible ventral hernia. Vascular: No leg edema. Skin: Palms without erythema. Port-A-Cath without erythema.   Lab Results:  Lab Results  Component Value Date   WBC 6.0 03/10/2019   HGB 14.9 03/10/2019   HCT 44.5 03/10/2019   MCV 91.9 03/10/2019   PLT 145 (L) 03/10/2019   NEUTROABS 4.4 03/10/2019    Imaging:  No results found.  Medications: I have reviewed the patient's current medications.  Assessment/Plan: 1. Adenocarcinoma of the cecum, stage IIA(T3N0), status post a right colectomy 07/26/2017 ? 0/16 lymph nodes positive, no lymphovascular invasion, perineural invasion present ? MSI-stable, no loss of mismatch repair protein expression ? Surveillance colonoscopy 07/26/2018-no polyps found ? Elevated CEA June 2020, persistently elevated August 2020 ? CTs 02/07/2019- bilateral liver metastases, no extrahepatic metastatic disease, changes of sarcoidosis in the chest ? Biopsy liver lesion 03/04/2019-metastatic adenocarcinoma, with morphology consistent with metastatic colonic adenocarcinoma 2. History of colon polyps 3. Microcytic anemia January 2019 4. Sarcoidosis 5. Coronary artery disease 6. Port-A-Cath placement, interventional radiology, 03/04/2019  Disposition: Mr. Lotito appears stable.  We reviewed the biopsy result from the liver lesion.  He understands the biopsy showed metastatic adenocarcinoma consistent with metastatic colon cancer.  He is  scheduled for cycle 1 FOLFOX 03/12/2019.  He agrees to proceed.  We again reviewed potential toxicities.  We discussed bone marrow toxicity, nausea, mouth sores, diarrhea, hair loss, allergic reaction.  We discussed the various forms of neuropathy associated with oxaliplatin.  He will attend a chemotherapy education class later today.  He has Compazine and EMLA cream at home.  We reviewed the CBC from today, adequate to proceed with treatment.  He will return for cycle 1 FOLFOX 03/12/2019.  He will return for lab, follow-up, cycle 2 FOLFOX 03/26/2019.  He will contact the office in the interim with any problems.  Patient seen with Dr. Benay Spice.  25 minutes were spent face-to-face at today's visit with the majority of that time involved in counseling/coordination of care.    Ned Card ANP/GNP-BC   03/10/2019  11:30 AM This was a shared visit with Ned Card.  The liver biopsy confirmed a diagnosis of metastatic colon cancer.  We are waiting on results of molecular testing.  I presented his case at the GI tumor conference last week.  Her Byerly reviewed the CT images and does not feel he is currently a candidate for hepatic resection.  Mr. Daniel will begin FOLFOX chemotherapy this week.  We reviewed potential toxicities associated with this regimen and he agrees to proceed. The plan is to schedule a restaging CT evaluation after 5 cycles of FOLFOX. Julieanne Manson, MD

## 2019-03-10 NOTE — Progress Notes (Signed)
Foundation 1 was requested by Manuela Schwartz RN on 03/05/19 on the biopsy from 03/04/2019.

## 2019-03-11 ENCOUNTER — Telehealth: Payer: Self-pay | Admitting: Oncology

## 2019-03-11 NOTE — Telephone Encounter (Signed)
I talk with patient he uses my chart for appointments

## 2019-03-12 ENCOUNTER — Inpatient Hospital Stay: Payer: Medicare Other

## 2019-03-12 ENCOUNTER — Encounter: Payer: Self-pay | Admitting: Oncology

## 2019-03-12 ENCOUNTER — Other Ambulatory Visit: Payer: Self-pay

## 2019-03-12 ENCOUNTER — Ambulatory Visit (HOSPITAL_COMMUNITY): Payer: Medicare Other

## 2019-03-12 VITALS — BP 141/64 | HR 61 | Temp 98.3°F | Resp 18

## 2019-03-12 DIAGNOSIS — C180 Malignant neoplasm of cecum: Secondary | ICD-10-CM

## 2019-03-12 DIAGNOSIS — Z5111 Encounter for antineoplastic chemotherapy: Secondary | ICD-10-CM | POA: Diagnosis not present

## 2019-03-12 MED ORDER — PALONOSETRON HCL INJECTION 0.25 MG/5ML
INTRAVENOUS | Status: AC
  Filled 2019-03-12: qty 5

## 2019-03-12 MED ORDER — DEXAMETHASONE SODIUM PHOSPHATE 10 MG/ML IJ SOLN
INTRAMUSCULAR | Status: AC
  Filled 2019-03-12: qty 1

## 2019-03-12 MED ORDER — DEXAMETHASONE SODIUM PHOSPHATE 10 MG/ML IJ SOLN
10.0000 mg | Freq: Once | INTRAMUSCULAR | Status: AC
  Administered 2019-03-12: 10 mg via INTRAVENOUS

## 2019-03-12 MED ORDER — OXALIPLATIN CHEMO INJECTION 100 MG/20ML
85.0000 mg/m2 | Freq: Once | INTRAVENOUS | Status: AC
  Administered 2019-03-12: 195 mg via INTRAVENOUS
  Filled 2019-03-12: qty 39

## 2019-03-12 MED ORDER — LEUCOVORIN CALCIUM INJECTION 350 MG
400.0000 mg/m2 | Freq: Once | INTRAVENOUS | Status: AC
  Administered 2019-03-12: 912 mg via INTRAVENOUS
  Filled 2019-03-12: qty 45.6

## 2019-03-12 MED ORDER — DEXTROSE 5 % IV SOLN
Freq: Once | INTRAVENOUS | Status: AC
  Administered 2019-03-12: 10:00:00 via INTRAVENOUS
  Filled 2019-03-12: qty 250

## 2019-03-12 MED ORDER — SODIUM CHLORIDE 0.9 % IV SOLN
2400.0000 mg/m2 | INTRAVENOUS | Status: DC
  Administered 2019-03-12: 13:00:00 5450 mg via INTRAVENOUS
  Filled 2019-03-12: qty 109

## 2019-03-12 MED ORDER — FLUOROURACIL CHEMO INJECTION 2.5 GM/50ML
400.0000 mg/m2 | Freq: Once | INTRAVENOUS | Status: AC
  Administered 2019-03-12: 900 mg via INTRAVENOUS
  Filled 2019-03-12: qty 18

## 2019-03-12 MED ORDER — DEXTROSE 5 % IV SOLN
Freq: Once | INTRAVENOUS | Status: AC
  Administered 2019-03-12: 09:00:00 via INTRAVENOUS
  Filled 2019-03-12: qty 250

## 2019-03-12 MED ORDER — PALONOSETRON HCL INJECTION 0.25 MG/5ML
0.2500 mg | Freq: Once | INTRAVENOUS | Status: AC
  Administered 2019-03-12: 0.25 mg via INTRAVENOUS

## 2019-03-12 NOTE — Progress Notes (Signed)
Met with patient in lobby area to introduce myself as Arboriculturist and to offer available resources.  Discussed one-time $26 Engineer, drilling to assist with personal expenses while going through treatment.  Gave him my card if interested in applying and for any additional financial questions or concerns.

## 2019-03-12 NOTE — Patient Instructions (Signed)
[place] Discharge Instructions for Patients Receiving Chemotherapy  Today you received the following chemotherapy agents Oxaliplatin, leucovorin, 5FU (Fluorouraci)  To help prevent nausea and vomiting after your treatment, we encourage you to take your nausea medication as directed   If you develop nausea and vomiting that is not controlled by your nausea medication, call the clinic.   BELOW ARE SYMPTOMS THAT SHOULD BE REPORTED IMMEDIATELY:  *FEVER GREATER THAN 100.5 F  *CHILLS WITH OR WITHOUT FEVER  NAUSEA AND VOMITING THAT IS NOT CONTROLLED WITH YOUR NAUSEA MEDICATION  *UNUSUAL SHORTNESS OF BREATH  *UNUSUAL BRUISING OR BLEEDING  TENDERNESS IN MOUTH AND THROAT WITH OR WITHOUT PRESENCE OF ULCERS  *URINARY PROBLEMS  *BOWEL PROBLEMS  UNUSUAL RASH Items with * indicate a potential emergency and should be followed up as soon as possible.  Feel free to call the clinic should you have any questions or concerns. The clinic phone number is [phone].  Please show the [place] at check-in to the Emergency Department and triage nurse.  Oxaliplatin Injection What is this medicine? OXALIPLATIN (ox AL i PLA tin) is a chemotherapy drug. It targets fast dividing cells, like cancer cells, and causes these cells to die. This medicine is used to treat cancers of the colon and rectum, and many other cancers. This medicine may be used for other purposes; ask your health care provider or pharmacist if you have questions. COMMON BRAND NAME(S): Eloxatin What should I tell my health care provider before I take this medicine? They need to know if you have any of these conditions:  kidney disease  an unusual or allergic reaction to oxaliplatin, other chemotherapy, other medicines, foods, dyes, or preservatives  pregnant or trying to get pregnant  breast-feeding How should I use this medicine? This drug is given as an infusion into a vein. It is administered in a  hospital or clinic by a specially trained health care professional. Talk to your pediatrician regarding the use of this medicine in children. Special care may be needed. Overdosage: If you think you have taken too much of this medicine contact a poison control center or emergency room at once. NOTE: This medicine is only for you. Do not share this medicine with others. What if I miss a dose? It is important not to miss a dose. Call your doctor or health care professional if you are unable to keep an appointment. What may interact with this medicine?  medicines to increase blood counts like filgrastim, pegfilgrastim, sargramostim  probenecid  some antibiotics like amikacin, gentamicin, neomycin, polymyxin B, streptomycin, tobramycin  zalcitabine Talk to your doctor or health care professional before taking any of these medicines:  acetaminophen  aspirin  ibuprofen  ketoprofen  naproxen This list may not describe all possible interactions. Give your health care provider a list of all the medicines, herbs, non-prescription drugs, or dietary supplements you use. Also tell them if you smoke, drink alcohol, or use illegal drugs. Some items may interact with your medicine. What should I watch for while using this medicine? Your condition will be monitored carefully while you are receiving this medicine. You will need important blood work done while you are taking this medicine. This medicine can make you more sensitive to cold. Do not drink cold drinks or use ice. Cover exposed skin before coming in contact with cold temperatures or cold objects. When out in cold weather wear warm clothing and cover your mouth and nose to warm the air that goes  into your lungs. Tell your doctor if you get sensitive to the cold. This drug may make you feel generally unwell. This is not uncommon, as chemotherapy can affect healthy cells as well as cancer cells. Report any side effects. Continue your course of  treatment even though you feel ill unless your doctor tells you to stop. In some cases, you may be given additional medicines to help with side effects. Follow all directions for their use. Call your doctor or health care professional for advice if you get a fever, chills or sore throat, or other symptoms of a cold or flu. Do not treat yourself. This drug decreases your body's ability to fight infections. Try to avoid being around people who are sick. This medicine may increase your risk to bruise or bleed. Call your doctor or health care professional if you notice any unusual bleeding. Be careful brushing and flossing your teeth or using a toothpick because you may get an infection or bleed more easily. If you have any dental work done, tell your dentist you are receiving this medicine. Avoid taking products that contain aspirin, acetaminophen, ibuprofen, naproxen, or ketoprofen unless instructed by your doctor. These medicines may hide a fever. Do not become pregnant while taking this medicine. Women should inform their doctor if they wish to become pregnant or think they might be pregnant. There is a potential for serious side effects to an unborn child. Talk to your health care professional or pharmacist for more information. Do not breast-feed an infant while taking this medicine. Call your doctor or health care professional if you get diarrhea. Do not treat yourself. What side effects may I notice from receiving this medicine? Side effects that you should report to your doctor or health care professional as soon as possible:  allergic reactions like skin rash, itching or hives, swelling of the face, lips, or tongue  low blood counts - This drug may decrease the number of white blood cells, red blood cells and platelets. You may be at increased risk for infections and bleeding.  signs of infection - fever or chills, cough, sore throat, pain or difficulty passing urine  signs of decreased  platelets or bleeding - bruising, pinpoint red spots on the skin, black, tarry stools, nosebleeds  signs of decreased red blood cells - unusually weak or tired, fainting spells, lightheadedness  breathing problems  chest pain, pressure  cough  diarrhea  jaw tightness  mouth sores  nausea and vomiting  pain, swelling, redness or irritation at the injection site  pain, tingling, numbness in the hands or feet  problems with balance, talking, walking  redness, blistering, peeling or loosening of the skin, including inside the mouth  trouble passing urine or change in the amount of urine Side effects that usually do not require medical attention (report to your doctor or health care professional if they continue or are bothersome):  changes in vision  constipation  hair loss  loss of appetite  metallic taste in the mouth or changes in taste  stomach pain This list may not describe all possible side effects. Call your doctor for medical advice about side effects. You may report side effects to FDA at 1-800-FDA-1088. Where should I keep my medicine? This drug is given in a hospital or clinic and will not be stored at home. NOTE: This sheet is a summary. It may not cover all possible information. If you have questions about this medicine, talk to your doctor, pharmacist, or health care provider.  2020 Elsevier/Gold Standard (2007-12-24 17:22:47)   Leucovorin injection What is this medicine? LEUCOVORIN (loo koe VOR in) is used to prevent or treat the harmful effects of some medicines. This medicine is used to treat anemia caused by a low amount of folic acid in the body. It is also used with 5-fluorouracil (5-FU) to treat colon cancer. This medicine may be used for other purposes; ask your health care provider or pharmacist if you have questions. What should I tell my health care provider before I take this medicine? They need to know if you have any of these  conditions:  anemia from low levels of vitamin B-12 in the blood  an unusual or allergic reaction to leucovorin, folic acid, other medicines, foods, dyes, or preservatives  pregnant or trying to get pregnant  breast-feeding How should I use this medicine? This medicine is for injection into a muscle or into a vein. It is given by a health care professional in a hospital or clinic setting. Talk to your pediatrician regarding the use of this medicine in children. Special care may be needed. Overdosage: If you think you have taken too much of this medicine contact a poison control center or emergency room at once. NOTE: This medicine is only for you. Do not share this medicine with others. What if I miss a dose? This does not apply. What may interact with this medicine?  capecitabine  fluorouracil  phenobarbital  phenytoin  primidone  trimethoprim-sulfamethoxazole This list may not describe all possible interactions. Give your health care provider a list of all the medicines, herbs, non-prescription drugs, or dietary supplements you use. Also tell them if you smoke, drink alcohol, or use illegal drugs. Some items may interact with your medicine. What should I watch for while using this medicine? Your condition will be monitored carefully while you are receiving this medicine. This medicine may increase the side effects of 5-fluorouracil, 5-FU. Tell your doctor or health care professional if you have diarrhea or mouth sores that do not get better or that get worse. What side effects may I notice from receiving this medicine? Side effects that you should report to your doctor or health care professional as soon as possible:  allergic reactions like skin rash, itching or hives, swelling of the face, lips, or tongue  breathing problems  fever, infection  mouth sores  unusual bleeding or bruising  unusually weak or tired Side effects that usually do not require medical  attention (report to your doctor or health care professional if they continue or are bothersome):  constipation or diarrhea  loss of appetite  nausea, vomiting This list may not describe all possible side effects. Call your doctor for medical advice about side effects. You may report side effects to FDA at 1-800-FDA-1088. Where should I keep my medicine? This drug is given in a hospital or clinic and will not be stored at home. NOTE: This sheet is a summary. It may not cover all possible information. If you have questions about this medicine, talk to your doctor, pharmacist, or health care provider.  2020 Elsevier/Gold Standard (2007-12-03 16:50:29)   Fluorouracil, 5FU; Diclofenac topical cream What is this medicine? FLUOROURACIL; DICLOFENAC (flure oh YOOR a sil; dye KLOE fen ak) is a combination of a topical chemotherapy agent and non-steroidal anti-inflammatory drug (NSAID). It is used on the skin to treat skin cancer and skin conditions that could become cancer. This medicine may be used for other purposes; ask your health care provider or pharmacist  if you have questions. COMMON BRAND NAME(S): FLUORAC What should I tell my health care provider before I take this medicine? They need to know if you have any of these conditions:  bleeding problems  cigarette smoker  DPD enzyme deficiency  heart disease  high blood pressure  if you frequently drink alcohol containing drinks  kidney disease  liver disease  open or infected skin  stomach problems  swelling or open sores at the treatment site  recent or planned coronary artery bypass graft (CABG) surgery  an unusual or allergic reaction to fluorouracil, diclofenac, aspirin, other NSAIDs, other medicines, foods, dyes, or preservatives  pregnant or trying to get pregnant  breast-feeding How should I use this medicine? This medicine is only for use on the skin. Follow the directions on the prescription label. Wash hands  before and after use. Wash affected area and gently pat dry. To apply this medicine use a cotton-tipped applicator, or use gloves if applying with fingertips. If applied with unprotected fingertips, it is very important to wash your hands well after you apply this medicine. Avoid applying to the eyes, nose, or mouth. Apply enough medicine to cover the affected area. You can cover the area with a light gauze dressing, but do not use tight or air-tight dressings. Finish the full course prescribed by your doctor or health care professional, even if you think your condition is better. Do not stop taking except on the advice of your doctor or health care professional. Talk to your pediatrician regarding the use of this medicine in children. Special care may be needed. Overdosage: If you think you have taken too much of this medicine contact a poison control center or emergency room at once. NOTE: This medicine is only for you. Do not share this medicine with others. What if I miss a dose? If you miss a dose, apply it as soon as you can. If it is almost time for your next dose, only use that dose. Do not apply extra doses. Contact your doctor or health care professional if you miss more than one dose. What may interact with this medicine? Interactions are not expected. Do not use any other skin products without telling your doctor or health care professional. This list may not describe all possible interactions. Give your health care provider a list of all the medicines, herbs, non-prescription drugs, or dietary supplements you use. Also tell them if you smoke, drink alcohol, or use illegal drugs. Some items may interact with your medicine. What should I watch for while using this medicine? Visit your doctor or healthcare provider for checks on your progress. You will need to use this medicine for 2 to 6 weeks. This may be longer depending on the condition being treated. You may not see full healing for another  1 to 2 months after you stop using the medicine. This medicine may cause serious skin reactions. They can happen weeks to months after starting the medicine. Contact your healthcare provider right away if you notice fevers or flu-like symptoms with a rash. The rash may be red or purple and then turn into blisters or peeling of the skin. Or, you might notice a red rash with swelling of the face, lips or lymph nodes in your neck or under your arms. Treated areas of skin can look unsightly during and for several weeks after treatment with this medicine. This medicine can make you more sensitive to the sun. Keep out of the sun. If you cannot avoid  being in the sun, wear protective clothing and use sunscreen. Do not use sun lamps or tanning beds/booths. If a pet comes in contact with the area where this medicine was applied to your skin or if it is ingested, they may have a serious risk of side effects. If accidental contact happens, the skin of the pet should be washed right away with soap and water. Contact your vet right away if your pet becomes exposed. Do not become pregnant while taking this medicine. Women should inform their doctor if they wish to become pregnant or think they might be pregnant. There is a potential for serious side effects to an unborn child. Talk to your healthcare provider or pharmacist for more information. What side effects may I notice from receiving this medicine? Side effects that you should report to your doctor or health care professional as soon as possible:  allergic reactions like skin rash, itching or hives, swelling of the face, lips, or tongue  black or bloody stools, blood in the urine or vomit  blurred vision  chest pain  difficulty breathing or wheezing  rash, fever, and swollen lymph nodes  redness, blistering, peeling or loosening of the skin, including inside the mouth  severe redness and swelling of normal skin  slurred speech or weakness on one  side of the body  trouble passing urine or change in the amount of urine  unexplained weight gain or swelling  unusually weak or tired  yellowing of eyes or skin Side effects that usually do not require medical attention (report to your doctor or health care professional if they continue or are bothersome):  increased sensitivity of the skin to sun and ultraviolet light  pain and burning of the affected area  scaling or swelling of the affected area  skin rash, itching of the affected area  tenderness This list may not describe all possible side effects. Call your doctor for medical advice about side effects. You may report side effects to FDA at 1-800-FDA-1088. Where should I keep my medicine? Keep out of the reach of children and pets. Store at room temperature between 20 and 25 degrees C (68 and 77 degrees F). Throw away any unused medicine after the expiration date. NOTE: This sheet is a summary. It may not cover all possible information. If you have questions about this medicine, talk to your doctor, pharmacist, or health care provider.  2020 Elsevier/Gold Standard (2018-08-14 13:31:57)

## 2019-03-13 ENCOUNTER — Telehealth: Payer: Self-pay | Admitting: *Deleted

## 2019-03-13 ENCOUNTER — Telehealth: Payer: Self-pay | Admitting: Emergency Medicine

## 2019-03-13 NOTE — Telephone Encounter (Signed)
Returning pt's VM left earlier for [facility].  Pt reporting constipation starting today.  States that he would like to wait to see if it resolves by tomorrow, and if it doesn't he would try stool softeners.  RN agreed with pt's plan.  Pt also reports that on his schedule there are only 3 courses out of the original 5 listed at this time.  Pt made aware that appts are being made at this time and that a more detailed long term calendar would soon be available for the rest of his txs.  Pt verbalized understanding.  Pt also wanted to let us know that his d/c paperwork stated d/c instructions for 5FU skin cream rather than the 5FU infusion, but that he is aware of information/side effects of 5FU infusion and that he has no concerns or questions about his infusion pump at this time.

## 2019-03-14 ENCOUNTER — Inpatient Hospital Stay: Payer: Medicare Other | Attending: Oncology

## 2019-03-14 ENCOUNTER — Other Ambulatory Visit: Payer: Self-pay

## 2019-03-14 ENCOUNTER — Ambulatory Visit (HOSPITAL_COMMUNITY): Payer: Medicare Other

## 2019-03-14 VITALS — BP 128/72 | HR 62 | Temp 98.3°F | Resp 18

## 2019-03-14 DIAGNOSIS — Z5111 Encounter for antineoplastic chemotherapy: Secondary | ICD-10-CM | POA: Insufficient documentation

## 2019-03-14 DIAGNOSIS — C180 Malignant neoplasm of cecum: Secondary | ICD-10-CM

## 2019-03-14 MED ORDER — SODIUM CHLORIDE 0.9% FLUSH
10.0000 mL | INTRAVENOUS | Status: DC | PRN
  Administered 2019-03-14: 10 mL
  Filled 2019-03-14: qty 10

## 2019-03-14 MED ORDER — HEPARIN SOD (PORK) LOCK FLUSH 100 UNIT/ML IV SOLN
500.0000 [IU] | Freq: Once | INTRAVENOUS | Status: AC | PRN
  Administered 2019-03-14: 500 [IU]
  Filled 2019-03-14: qty 5

## 2019-03-18 NOTE — Progress Notes (Signed)
[facility] results received and placed on MD desk for review.

## 2019-03-19 ENCOUNTER — Encounter (HOSPITAL_COMMUNITY): Payer: Self-pay | Admitting: Oncology

## 2019-03-21 ENCOUNTER — Encounter: Payer: Self-pay | Admitting: Oncology

## 2019-03-23 ENCOUNTER — Other Ambulatory Visit: Payer: Self-pay | Admitting: Oncology

## 2019-03-24 ENCOUNTER — Encounter: Payer: Self-pay | Admitting: *Deleted

## 2019-03-26 ENCOUNTER — Inpatient Hospital Stay: Payer: Medicare Other

## 2019-03-26 ENCOUNTER — Inpatient Hospital Stay (HOSPITAL_BASED_OUTPATIENT_CLINIC_OR_DEPARTMENT_OTHER): Payer: Medicare Other | Admitting: Oncology

## 2019-03-26 ENCOUNTER — Other Ambulatory Visit: Payer: Self-pay

## 2019-03-26 ENCOUNTER — Encounter: Payer: Self-pay | Admitting: Oncology

## 2019-03-26 VITALS — BP 127/55 | HR 57 | Temp 98.3°F | Resp 18 | Ht 71.0 in | Wt 227.1 lb

## 2019-03-26 DIAGNOSIS — C180 Malignant neoplasm of cecum: Secondary | ICD-10-CM

## 2019-03-26 DIAGNOSIS — Z5111 Encounter for antineoplastic chemotherapy: Secondary | ICD-10-CM | POA: Diagnosis not present

## 2019-03-26 DIAGNOSIS — Z95828 Presence of other vascular implants and grafts: Secondary | ICD-10-CM

## 2019-03-26 LAB — CBC WITH DIFFERENTIAL (CANCER CENTER ONLY)
Abs Immature Granulocytes: 0 10*3/uL (ref 0.00–0.07)
Basophils Absolute: 0 10*3/uL (ref 0.0–0.1)
Basophils Relative: 1 %
Eosinophils Absolute: 0.3 10*3/uL (ref 0.0–0.5)
Eosinophils Relative: 9 %
HCT: 39.9 % (ref 39.0–52.0)
Hemoglobin: 13.7 g/dL (ref 13.0–17.0)
Immature Granulocytes: 0 %
Lymphocytes Relative: 12 %
Lymphs Abs: 0.4 10*3/uL — ABNORMAL LOW (ref 0.7–4.0)
MCH: 31.2 pg (ref 26.0–34.0)
MCHC: 34.3 g/dL (ref 30.0–36.0)
MCV: 90.9 fL (ref 80.0–100.0)
Monocytes Absolute: 0.5 10*3/uL (ref 0.1–1.0)
Monocytes Relative: 14 %
Neutro Abs: 2.4 10*3/uL (ref 1.7–7.7)
Neutrophils Relative %: 64 %
Platelet Count: 96 10*3/uL — ABNORMAL LOW (ref 150–400)
RBC: 4.39 MIL/uL (ref 4.22–5.81)
RDW: 13.7 % (ref 11.5–15.5)
WBC Count: 3.8 10*3/uL — ABNORMAL LOW (ref 4.0–10.5)
nRBC: 0 % (ref 0.0–0.2)

## 2019-03-26 LAB — CMP (CANCER CENTER ONLY)
ALT: 42 U/L (ref 0–44)
AST: 44 U/L — ABNORMAL HIGH (ref 15–41)
Albumin: 3.7 g/dL (ref 3.5–5.0)
Alkaline Phosphatase: 148 U/L — ABNORMAL HIGH (ref 38–126)
Anion gap: 8 (ref 5–15)
BUN: 15 mg/dL (ref 8–23)
CO2: 26 mmol/L (ref 22–32)
Calcium: 8.4 mg/dL — ABNORMAL LOW (ref 8.9–10.3)
Chloride: 105 mmol/L (ref 98–111)
Creatinine: 0.85 mg/dL (ref 0.61–1.24)
GFR, Est AFR Am: >60 mL/min (ref >60)
GFR, Estimated: >60 mL/min (ref >60)
Glucose, Bld: 109 mg/dL — ABNORMAL HIGH (ref 70–99)
Potassium: 4.5 mmol/L (ref 3.5–5.1)
Sodium: 139 mmol/L (ref 135–145)
Total Bilirubin: 0.5 mg/dL (ref 0.3–1.2)
Total Protein: 6.4 g/dL — ABNORMAL LOW (ref 6.5–8.1)

## 2019-03-26 MED ORDER — DEXTROSE 5 % IV SOLN
Freq: Once | INTRAVENOUS | Status: AC
  Administered 2019-03-26: 12:00:00 via INTRAVENOUS
  Filled 2019-03-26: qty 250

## 2019-03-26 MED ORDER — SODIUM CHLORIDE 0.9 % IV SOLN
2400.0000 mg/m2 | INTRAVENOUS | Status: DC
  Administered 2019-03-26: 5450 mg via INTRAVENOUS
  Filled 2019-03-26: qty 109

## 2019-03-26 MED ORDER — DEXAMETHASONE SODIUM PHOSPHATE 10 MG/ML IJ SOLN
INTRAMUSCULAR | Status: AC
  Filled 2019-03-26: qty 1

## 2019-03-26 MED ORDER — OXALIPLATIN CHEMO INJECTION 100 MG/20ML
65.0000 mg/m2 | Freq: Once | INTRAVENOUS | Status: AC
  Administered 2019-03-26: 150 mg via INTRAVENOUS
  Filled 2019-03-26: qty 20

## 2019-03-26 MED ORDER — PALONOSETRON HCL INJECTION 0.25 MG/5ML
0.2500 mg | Freq: Once | INTRAVENOUS | Status: AC
  Administered 2019-03-26: 0.25 mg via INTRAVENOUS

## 2019-03-26 MED ORDER — DEXAMETHASONE SODIUM PHOSPHATE 10 MG/ML IJ SOLN
10.0000 mg | Freq: Once | INTRAMUSCULAR | Status: AC
  Administered 2019-03-26: 10 mg via INTRAVENOUS

## 2019-03-26 MED ORDER — FLUOROURACIL CHEMO INJECTION 2.5 GM/50ML
400.0000 mg/m2 | Freq: Once | INTRAVENOUS | Status: AC
  Administered 2019-03-26: 900 mg via INTRAVENOUS
  Filled 2019-03-26: qty 18

## 2019-03-26 MED ORDER — PALONOSETRON HCL INJECTION 0.25 MG/5ML
INTRAVENOUS | Status: AC
  Filled 2019-03-26: qty 5

## 2019-03-26 MED ORDER — LEUCOVORIN CALCIUM INJECTION 350 MG
400.0000 mg/m2 | Freq: Once | INTRAVENOUS | Status: AC
  Administered 2019-03-26: 912 mg via INTRAVENOUS
  Filled 2019-03-26: qty 45.6

## 2019-03-26 MED ORDER — SODIUM CHLORIDE 0.9% FLUSH
10.0000 mL | INTRAVENOUS | Status: DC | PRN
  Administered 2019-03-26: 10 mL
  Filled 2019-03-26: qty 10

## 2019-03-26 NOTE — Patient Instructions (Signed)
[facility] Discharge Instructions for Patients Receiving Chemotherapy  Today you received the following chemotherapy agents Oxaliplatin, leucovorin, 5FU (Fluorouraci)  To help prevent nausea and vomiting after your treatment, we encourage you to take your nausea medication as directed   If you develop nausea and vomiting that is not controlled by your nausea medication, call the clinic.   BELOW ARE SYMPTOMS THAT SHOULD BE REPORTED IMMEDIATELY:  *FEVER GREATER THAN 100.5 F  *CHILLS WITH OR WITHOUT FEVER  NAUSEA AND VOMITING THAT IS NOT CONTROLLED WITH YOUR NAUSEA MEDICATION  *UNUSUAL SHORTNESS OF BREATH  *UNUSUAL BRUISING OR BLEEDING  TENDERNESS IN MOUTH AND THROAT WITH OR WITHOUT PRESENCE OF ULCERS  *URINARY PROBLEMS  *BOWEL PROBLEMS  UNUSUAL RASH Items with * indicate a potential emergency and should be followed up as soon as possible.  Feel free to call the clinic should you have any questions or concerns. The clinic phone number is [phone].  Please show the [facility] at check-in to the Emergency Department and triage nurse.  Oxaliplatin Injection What is this medicine? OXALIPLATIN (ox AL i PLA tin) is a chemotherapy drug. It targets fast dividing cells, like cancer cells, and causes these cells to die. This medicine is used to treat cancers of the colon and rectum, and many other cancers. This medicine may be used for other purposes; ask your health care provider or pharmacist if you have questions. COMMON BRAND NAME(S): Eloxatin What should I tell my health care provider before I take this medicine? They need to know if you have any of these conditions:  kidney disease  an unusual or allergic reaction to oxaliplatin, other chemotherapy, other medicines, foods, dyes, or preservatives  pregnant or trying to get pregnant  breast-feeding How should I use this medicine? This drug is given as an infusion into a vein. It is administered in a  hospital or clinic by a specially trained health care professional. Talk to your pediatrician regarding the use of this medicine in children. Special care may be needed. Overdosage: If you think you have taken too much of this medicine contact a poison control center or emergency room at once. NOTE: This medicine is only for you. Do not share this medicine with others. What if I miss a dose? It is important not to miss a dose. Call your doctor or health care professional if you are unable to keep an appointment. What may interact with this medicine?  medicines to increase blood counts like filgrastim, pegfilgrastim, sargramostim  probenecid  some antibiotics like amikacin, gentamicin, neomycin, polymyxin B, streptomycin, tobramycin  zalcitabine Talk to your doctor or health care professional before taking any of these medicines:  acetaminophen  aspirin  ibuprofen  ketoprofen  naproxen This list may not describe all possible interactions. Give your health care provider a list of all the medicines, herbs, non-prescription drugs, or dietary supplements you use. Also tell them if you smoke, drink alcohol, or use illegal drugs. Some items may interact with your medicine. What should I watch for while using this medicine? Your condition will be monitored carefully while you are receiving this medicine. You will need important blood work done while you are taking this medicine. This medicine can make you more sensitive to cold. Do not drink cold drinks or use ice. Cover exposed skin before coming in contact with cold temperatures or cold objects. When out in cold weather wear warm clothing and cover your mouth and nose to warm the air that goes  into your lungs. Tell your doctor if you get sensitive to the cold. This drug may make you feel generally unwell. This is not uncommon, as chemotherapy can affect healthy cells as well as cancer cells. Report any side effects. Continue your course of  treatment even though you feel ill unless your doctor tells you to stop. In some cases, you may be given additional medicines to help with side effects. Follow all directions for their use. Call your doctor or health care professional for advice if you get a fever, chills or sore throat, or other symptoms of a cold or flu. Do not treat yourself. This drug decreases your body's ability to fight infections. Try to avoid being around people who are sick. This medicine may increase your risk to bruise or bleed. Call your doctor or health care professional if you notice any unusual bleeding. Be careful brushing and flossing your teeth or using a toothpick because you may get an infection or bleed more easily. If you have any dental work done, tell your dentist you are receiving this medicine. Avoid taking products that contain aspirin, acetaminophen, ibuprofen, naproxen, or ketoprofen unless instructed by your doctor. These medicines may hide a fever. Do not become pregnant while taking this medicine. Women should inform their doctor if they wish to become pregnant or think they might be pregnant. There is a potential for serious side effects to an unborn child. Talk to your health care professional or pharmacist for more information. Do not breast-feed an infant while taking this medicine. Call your doctor or health care professional if you get diarrhea. Do not treat yourself. What side effects may I notice from receiving this medicine? Side effects that you should report to your doctor or health care professional as soon as possible:  allergic reactions like skin rash, itching or hives, swelling of the face, lips, or tongue  low blood counts - This drug may decrease the number of white blood cells, red blood cells and platelets. You may be at increased risk for infections and bleeding.  signs of infection - fever or chills, cough, sore throat, pain or difficulty passing urine  signs of decreased  platelets or bleeding - bruising, pinpoint red spots on the skin, black, tarry stools, nosebleeds  signs of decreased red blood cells - unusually weak or tired, fainting spells, lightheadedness  breathing problems  chest pain, pressure  cough  diarrhea  jaw tightness  mouth sores  nausea and vomiting  pain, swelling, redness or irritation at the injection site  pain, tingling, numbness in the hands or feet  problems with balance, talking, walking  redness, blistering, peeling or loosening of the skin, including inside the mouth  trouble passing urine or change in the amount of urine Side effects that usually do not require medical attention (report to your doctor or health care professional if they continue or are bothersome):  changes in vision  constipation  hair loss  loss of appetite  metallic taste in the mouth or changes in taste  stomach pain This list may not describe all possible side effects. Call your doctor for medical advice about side effects. You may report side effects to FDA at 1-800-FDA-1088. Where should I keep my medicine? This drug is given in a hospital or clinic and will not be stored at home. NOTE: This sheet is a summary. It may not cover all possible information. If you have questions about this medicine, talk to your doctor, pharmacist, or health care provider.  2020 Elsevier/Gold Standard (2007-12-24 17:22:47)   Leucovorin injection What is this medicine? LEUCOVORIN (loo koe VOR in) is used to prevent or treat the harmful effects of some medicines. This medicine is used to treat anemia caused by a low amount of folic acid in the body. It is also used with 5-fluorouracil (5-FU) to treat colon cancer. This medicine may be used for other purposes; ask your health care provider or pharmacist if you have questions. What should I tell my health care provider before I take this medicine? They need to know if you have any of these  conditions:  anemia from low levels of vitamin B-12 in the blood  an unusual or allergic reaction to leucovorin, folic acid, other medicines, foods, dyes, or preservatives  pregnant or trying to get pregnant  breast-feeding How should I use this medicine? This medicine is for injection into a muscle or into a vein. It is given by a health care professional in a hospital or clinic setting. Talk to your pediatrician regarding the use of this medicine in children. Special care may be needed. Overdosage: If you think you have taken too much of this medicine contact a poison control center or emergency room at once. NOTE: This medicine is only for you. Do not share this medicine with others. What if I miss a dose? This does not apply. What may interact with this medicine?  capecitabine  fluorouracil  phenobarbital  phenytoin  primidone  trimethoprim-sulfamethoxazole This list may not describe all possible interactions. Give your health care provider a list of all the medicines, herbs, non-prescription drugs, or dietary supplements you use. Also tell them if you smoke, drink alcohol, or use illegal drugs. Some items may interact with your medicine. What should I watch for while using this medicine? Your condition will be monitored carefully while you are receiving this medicine. This medicine may increase the side effects of 5-fluorouracil, 5-FU. Tell your doctor or health care professional if you have diarrhea or mouth sores that do not get better or that get worse. What side effects may I notice from receiving this medicine? Side effects that you should report to your doctor or health care professional as soon as possible:  allergic reactions like skin rash, itching or hives, swelling of the face, lips, or tongue  breathing problems  fever, infection  mouth sores  unusual bleeding or bruising  unusually weak or tired Side effects that usually do not require medical  attention (report to your doctor or health care professional if they continue or are bothersome):  constipation or diarrhea  loss of appetite  nausea, vomiting This list may not describe all possible side effects. Call your doctor for medical advice about side effects. You may report side effects to FDA at 1-800-FDA-1088. Where should I keep my medicine? This drug is given in a hospital or clinic and will not be stored at home. NOTE: This sheet is a summary. It may not cover all possible information. If you have questions about this medicine, talk to your doctor, pharmacist, or health care provider.  2020 Elsevier/Gold Standard (2007-12-03 16:50:29)   Fluorouracil, 5FU; Diclofenac topical cream What is this medicine? FLUOROURACIL; DICLOFENAC (flure oh YOOR a sil; dye KLOE fen ak) is a combination of a topical chemotherapy agent and non-steroidal anti-inflammatory drug (NSAID). It is used on the skin to treat skin cancer and skin conditions that could become cancer. This medicine may be used for other purposes; ask your health care provider or pharmacist  if you have questions. COMMON BRAND NAME(S): FLUORAC What should I tell my health care provider before I take this medicine? They need to know if you have any of these conditions:  bleeding problems  cigarette smoker  DPD enzyme deficiency  heart disease  high blood pressure  if you frequently drink alcohol containing drinks  kidney disease  liver disease  open or infected skin  stomach problems  swelling or open sores at the treatment site  recent or planned coronary artery bypass graft (CABG) surgery  an unusual or allergic reaction to fluorouracil, diclofenac, aspirin, other NSAIDs, other medicines, foods, dyes, or preservatives  pregnant or trying to get pregnant  breast-feeding How should I use this medicine? This medicine is only for use on the skin. Follow the directions on the prescription label. Wash hands  before and after use. Wash affected area and gently pat dry. To apply this medicine use a cotton-tipped applicator, or use gloves if applying with fingertips. If applied with unprotected fingertips, it is very important to wash your hands well after you apply this medicine. Avoid applying to the eyes, nose, or mouth. Apply enough medicine to cover the affected area. You can cover the area with a light gauze dressing, but do not use tight or air-tight dressings. Finish the full course prescribed by your doctor or health care professional, even if you think your condition is better. Do not stop taking except on the advice of your doctor or health care professional. Talk to your pediatrician regarding the use of this medicine in children. Special care may be needed. Overdosage: If you think you have taken too much of this medicine contact a poison control center or emergency room at once. NOTE: This medicine is only for you. Do not share this medicine with others. What if I miss a dose? If you miss a dose, apply it as soon as you can. If it is almost time for your next dose, only use that dose. Do not apply extra doses. Contact your doctor or health care professional if you miss more than one dose. What may interact with this medicine? Interactions are not expected. Do not use any other skin products without telling your doctor or health care professional. This list may not describe all possible interactions. Give your health care provider a list of all the medicines, herbs, non-prescription drugs, or dietary supplements you use. Also tell them if you smoke, drink alcohol, or use illegal drugs. Some items may interact with your medicine. What should I watch for while using this medicine? Visit your doctor or healthcare provider for checks on your progress. You will need to use this medicine for 2 to 6 weeks. This may be longer depending on the condition being treated. You may not see full healing for another  1 to 2 months after you stop using the medicine. This medicine may cause serious skin reactions. They can happen weeks to months after starting the medicine. Contact your healthcare provider right away if you notice fevers or flu-like symptoms with a rash. The rash may be red or purple and then turn into blisters or peeling of the skin. Or, you might notice a red rash with swelling of the face, lips or lymph nodes in your neck or under your arms. Treated areas of skin can look unsightly during and for several weeks after treatment with this medicine. This medicine can make you more sensitive to the sun. Keep out of the sun. If you cannot avoid  being in the sun, wear protective clothing and use sunscreen. Do not use sun lamps or tanning beds/booths. If a pet comes in contact with the area where this medicine was applied to your skin or if it is ingested, they may have a serious risk of side effects. If accidental contact happens, the skin of the pet should be washed right away with soap and water. Contact your vet right away if your pet becomes exposed. Do not become pregnant while taking this medicine. Women should inform their doctor if they wish to become pregnant or think they might be pregnant. There is a potential for serious side effects to an unborn child. Talk to your healthcare provider or pharmacist for more information. What side effects may I notice from receiving this medicine? Side effects that you should report to your doctor or health care professional as soon as possible:  allergic reactions like skin rash, itching or hives, swelling of the face, lips, or tongue  black or bloody stools, blood in the urine or vomit  blurred vision  chest pain  difficulty breathing or wheezing  rash, fever, and swollen lymph nodes  redness, blistering, peeling or loosening of the skin, including inside the mouth  severe redness and swelling of normal skin  slurred speech or weakness on one  side of the body  trouble passing urine or change in the amount of urine  unexplained weight gain or swelling  unusually weak or tired  yellowing of eyes or skin Side effects that usually do not require medical attention (report to your doctor or health care professional if they continue or are bothersome):  increased sensitivity of the skin to sun and ultraviolet light  pain and burning of the affected area  scaling or swelling of the affected area  skin rash, itching of the affected area  tenderness This list may not describe all possible side effects. Call your doctor for medical advice about side effects. You may report side effects to FDA at 1-800-FDA-1088. Where should I keep my medicine? Keep out of the reach of children and pets. Store at room temperature between 20 and 25 degrees C (68 and 77 degrees F). Throw away any unused medicine after the expiration date. NOTE: This sheet is a summary. It may not cover all possible information. If you have questions about this medicine, talk to your doctor, pharmacist, or health care provider.  2020 Elsevier/Gold Standard (2018-08-14 13:31:57)

## 2019-03-26 NOTE — Progress Notes (Signed)
Per Dr. Benay Spice ok to treat with platelets below 100. Oxali was dose reduced.

## 2019-03-26 NOTE — Progress Notes (Signed)
Patient called to obtain information regarding one-time $700 [place].  Based on income guidelines for his household of 2, patient states he exceeds income guidelines.  Advised patient I would also research if there is any copay assistance available for his diagnosis and should something come available, I would reach out to him to advise. He verbalized understanding.  He has my card for any additional financial questions or concerns.

## 2019-03-26 NOTE — Progress Notes (Signed)
  [facility] OFFICE PROGRESS NOTE   Diagnosis: Colon cancer  INTERVAL HISTORY:   Luis [name] completed cycle 1 of FOLFOX on 03/12/2019.  No nausea, mouth sores, or diarrhea.  He had cold sensitivity for 2 days following chemotherapy.  He noticed tingling in the feet in the evening beginning approximately 2 days ago.  He is exercising.  He has a "cold sore "at the upper outer lip.  Objective:  Vital signs in last 24 hours:  Blood pressure (!) 127/55, pulse (!) 57, temperature 98.3 F (36.8 C), temperature source Temporal, resp. rate 18, height '5\' 11"'$  (1.803 m), weight 227 lb 1.6 oz (103 kg), SpO2 99 %.    HEENT: No thrush or ulcers, resolving ulceration beneath the nasal septum GI: No hepatosplenomegaly, nontender Vascular: No leg edema Skin: Palms without erythema, mild plaque of erythema at the right lower anterior lateral neck  Portacath/PICC-without erythema  Lab Results:  Lab Results  Component Value Date   WBC 3.8 (L) 03/26/2019   HGB 13.7 03/26/2019   HCT 39.9 03/26/2019   MCV 90.9 03/26/2019   PLT 96 (L) 03/26/2019   NEUTROABS 2.4 03/26/2019    CMP  Lab Results  Component Value Date   NA 137 03/10/2019   K 4.9 03/10/2019   CL 101 03/10/2019   CO2 28 03/10/2019   GLUCOSE 104 (H) 03/10/2019   BUN 18 03/10/2019   CREATININE 0.97 03/10/2019   CALCIUM 9.4 03/10/2019   PROT 7.0 03/10/2019   ALBUMIN 4.2 03/10/2019   AST 53 (H) 03/10/2019   ALT 49 (H) 03/10/2019   ALKPHOS 132 (H) 03/10/2019   BILITOT 0.6 03/10/2019   GFRNONAA >60 03/10/2019   GFRAA >60 03/10/2019    Lab Results  Component Value Date   CEA1 6.82 (H) 01/24/2019     Medications: I have reviewed the patient's current medications.   Assessment/Plan: 1. Adenocarcinoma of the cecum, stage IIA(T3N0), status post a right colectomy 07/26/2017 ? 0/16 lymph nodes positive, no lymphovascular invasion, perineural invasion present ? MSI-stable, no loss of mismatch repair protein  expression ? Surveillance colonoscopy 07/26/2018-no polyps found ? Elevated CEA June 2020, persistently elevated August 2020 ? CTs 02/07/2019- bilateral liver metastases, no extrahepatic metastatic disease, changes of sarcoidosis in the chest ? Biopsy liver lesion 03/04/2019-metastatic adenocarcinoma, with morphology consistent with metastatic colonic adenocarcinoma ? Cycle 1 FOLFOX 03/12/2019 ? Cycle 2 FOLFOX 03/26/2019, oxaliplatin dose reduced secondary to thrombocytopenia 2. History of colon polyps 3. Microcytic anemia January 2019 4. Sarcoidosis 5. Coronary artery disease 6. Port-A-Cath placement, interventional radiology, 03/04/2019 7. Thrombocytopenia, mild thrombocytopenia predating chemotherapy     Disposition: Luis [name] tolerated the first cycle of FOLFOX well.  He will complete cycle 2 today.  I dose reduced the oxaliplatin secondary to mild thrombocytopenia.  He is maintained on aspirin and Plavix.  He will call for bleeding or bruising.  Luis [name] will return for an office visit and chemotherapy in 2 weeks.  Betsy Coder, MD  03/26/2019  10:52 AM

## 2019-03-28 ENCOUNTER — Inpatient Hospital Stay: Payer: Medicare Other

## 2019-03-28 ENCOUNTER — Other Ambulatory Visit: Payer: Self-pay

## 2019-03-28 VITALS — BP 145/63 | HR 61 | Temp 98.2°F | Resp 18

## 2019-03-28 DIAGNOSIS — C180 Malignant neoplasm of cecum: Secondary | ICD-10-CM

## 2019-03-28 DIAGNOSIS — Z5111 Encounter for antineoplastic chemotherapy: Secondary | ICD-10-CM | POA: Diagnosis not present

## 2019-03-28 MED ORDER — HEPARIN SOD (PORK) LOCK FLUSH 100 UNIT/ML IV SOLN
500.0000 [IU] | Freq: Once | INTRAVENOUS | Status: AC | PRN
  Administered 2019-03-28: 500 [IU]
  Filled 2019-03-28: qty 5

## 2019-03-28 MED ORDER — SODIUM CHLORIDE 0.9% FLUSH
10.0000 mL | INTRAVENOUS | Status: DC | PRN
  Administered 2019-03-28: 10 mL
  Filled 2019-03-28: qty 10

## 2019-04-05 ENCOUNTER — Other Ambulatory Visit: Payer: Self-pay | Admitting: Oncology

## 2019-04-09 ENCOUNTER — Inpatient Hospital Stay: Payer: Medicare Other

## 2019-04-09 ENCOUNTER — Other Ambulatory Visit: Payer: Self-pay

## 2019-04-09 ENCOUNTER — Inpatient Hospital Stay (HOSPITAL_BASED_OUTPATIENT_CLINIC_OR_DEPARTMENT_OTHER): Payer: Medicare Other | Admitting: Oncology

## 2019-04-09 VITALS — BP 120/59 | HR 58 | Temp 98.5°F | Resp 17 | Ht 71.0 in | Wt 226.8 lb

## 2019-04-09 DIAGNOSIS — Z95828 Presence of other vascular implants and grafts: Secondary | ICD-10-CM

## 2019-04-09 DIAGNOSIS — C180 Malignant neoplasm of cecum: Secondary | ICD-10-CM

## 2019-04-09 DIAGNOSIS — Z5111 Encounter for antineoplastic chemotherapy: Secondary | ICD-10-CM | POA: Diagnosis not present

## 2019-04-09 LAB — CBC WITH DIFFERENTIAL (CANCER CENTER ONLY)
Abs Immature Granulocytes: 0.01 10*3/uL (ref 0.00–0.07)
Basophils Absolute: 0 10*3/uL (ref 0.0–0.1)
Basophils Relative: 1 %
Eosinophils Absolute: 0.1 10*3/uL (ref 0.0–0.5)
Eosinophils Relative: 3 %
HCT: 38.1 % — ABNORMAL LOW (ref 39.0–52.0)
Hemoglobin: 13.2 g/dL (ref 13.0–17.0)
Immature Granulocytes: 0 %
Lymphocytes Relative: 11 %
Lymphs Abs: 0.4 10*3/uL — ABNORMAL LOW (ref 0.7–4.0)
MCH: 30.9 pg (ref 26.0–34.0)
MCHC: 34.6 g/dL (ref 30.0–36.0)
MCV: 89.2 fL (ref 80.0–100.0)
Monocytes Absolute: 0.6 10*3/uL (ref 0.1–1.0)
Monocytes Relative: 16 %
Neutro Abs: 2.6 10*3/uL (ref 1.7–7.7)
Neutrophils Relative %: 69 %
Platelet Count: 80 10*3/uL — ABNORMAL LOW (ref 150–400)
RBC: 4.27 MIL/uL (ref 4.22–5.81)
RDW: 14.5 % (ref 11.5–15.5)
WBC Count: 3.8 10*3/uL — ABNORMAL LOW (ref 4.0–10.5)
nRBC: 0 % (ref 0.0–0.2)

## 2019-04-09 LAB — CMP (CANCER CENTER ONLY)
ALT: 37 U/L (ref 0–44)
AST: 40 U/L (ref 15–41)
Albumin: 3.6 g/dL (ref 3.5–5.0)
Alkaline Phosphatase: 107 U/L (ref 38–126)
Anion gap: 8 (ref 5–15)
BUN: 18 mg/dL (ref 8–23)
CO2: 26 mmol/L (ref 22–32)
Calcium: 8.7 mg/dL — ABNORMAL LOW (ref 8.9–10.3)
Chloride: 104 mmol/L (ref 98–111)
Creatinine: 0.89 mg/dL (ref 0.61–1.24)
GFR, Est AFR Am: >60 mL/min (ref >60)
GFR, Estimated: >60 mL/min (ref >60)
Glucose, Bld: 118 mg/dL — ABNORMAL HIGH (ref 70–99)
Potassium: 4.6 mmol/L (ref 3.5–5.1)
Sodium: 138 mmol/L (ref 135–145)
Total Bilirubin: 0.5 mg/dL (ref 0.3–1.2)
Total Protein: 6.2 g/dL — ABNORMAL LOW (ref 6.5–8.1)

## 2019-04-09 LAB — CEA (IN HOUSE-CHCC): CEA (CHCC-In House): 6.69 ng/mL — ABNORMAL HIGH (ref 0.00–5.00)

## 2019-04-09 MED ORDER — DEXAMETHASONE SODIUM PHOSPHATE 10 MG/ML IJ SOLN
INTRAMUSCULAR | Status: AC
  Filled 2019-04-09: qty 1

## 2019-04-09 MED ORDER — OXALIPLATIN CHEMO INJECTION 100 MG/20ML
55.0000 mg/m2 | Freq: Once | INTRAVENOUS | Status: AC
  Administered 2019-04-09: 125 mg via INTRAVENOUS
  Filled 2019-04-09: qty 20

## 2019-04-09 MED ORDER — SODIUM CHLORIDE 0.9 % IV SOLN
2400.0000 mg/m2 | INTRAVENOUS | Status: DC
  Administered 2019-04-09: 5450 mg via INTRAVENOUS
  Filled 2019-04-09: qty 109

## 2019-04-09 MED ORDER — PALONOSETRON HCL INJECTION 0.25 MG/5ML
INTRAVENOUS | Status: AC
  Filled 2019-04-09: qty 5

## 2019-04-09 MED ORDER — FLUOROURACIL CHEMO INJECTION 2.5 GM/50ML
400.0000 mg/m2 | Freq: Once | INTRAVENOUS | Status: AC
  Administered 2019-04-09: 900 mg via INTRAVENOUS
  Filled 2019-04-09: qty 18

## 2019-04-09 MED ORDER — PALONOSETRON HCL INJECTION 0.25 MG/5ML
0.2500 mg | Freq: Once | INTRAVENOUS | Status: AC
  Administered 2019-04-09: 0.25 mg via INTRAVENOUS

## 2019-04-09 MED ORDER — DEXAMETHASONE SODIUM PHOSPHATE 10 MG/ML IJ SOLN
10.0000 mg | Freq: Once | INTRAMUSCULAR | Status: AC
  Administered 2019-04-09: 10 mg via INTRAVENOUS

## 2019-04-09 MED ORDER — DEXTROSE 5 % IV SOLN
Freq: Once | INTRAVENOUS | Status: AC
  Administered 2019-04-09: 11:00:00 via INTRAVENOUS
  Filled 2019-04-09: qty 250

## 2019-04-09 MED ORDER — SODIUM CHLORIDE 0.9% FLUSH
10.0000 mL | INTRAVENOUS | Status: DC | PRN
  Administered 2019-04-09: 10 mL
  Filled 2019-04-09: qty 10

## 2019-04-09 MED ORDER — LEUCOVORIN CALCIUM INJECTION 350 MG
400.0000 mg/m2 | Freq: Once | INTRAVENOUS | Status: AC
  Administered 2019-04-09: 912 mg via INTRAVENOUS
  Filled 2019-04-09: qty 45.6

## 2019-04-09 NOTE — Patient Instructions (Signed)
Oxaliplatin Injection What is this medicine? OXALIPLATIN (ox AL i PLA tin) is a chemotherapy drug. It targets fast dividing cells, like cancer cells, and causes these cells to die. This medicine is used to treat cancers of the colon and rectum, and many other cancers. This medicine may be used for other purposes; ask your health care provider or pharmacist if you have questions. COMMON BRAND NAME(S): Eloxatin What should I tell my health care provider before I take this medicine? They need to know if you have any of these conditions:  kidney disease  an unusual or allergic reaction to oxaliplatin, other chemotherapy, other medicines, foods, dyes, or preservatives  pregnant or trying to get pregnant  breast-feeding How should I use this medicine? This drug is given as an infusion into a vein. It is administered in a hospital or clinic by a specially trained health care professional. Talk to your pediatrician regarding the use of this medicine in children. Special care may be needed. Overdosage: If you think you have taken too much of this medicine contact a poison control center or emergency room at once. NOTE: This medicine is only for you. Do not share this medicine with others. What if I miss a dose? It is important not to miss a dose. Call your doctor or health care professional if you are unable to keep an appointment. What may interact with this medicine?  medicines to increase blood counts like filgrastim, pegfilgrastim, sargramostim  probenecid  some antibiotics like amikacin, gentamicin, neomycin, polymyxin B, streptomycin, tobramycin  zalcitabine Talk to your doctor or health care professional before taking any of these medicines:  acetaminophen  aspirin  ibuprofen  ketoprofen  naproxen This list may not describe all possible interactions. Give your health care provider a list of all the medicines, herbs, non-prescription drugs, or dietary supplements you use. Also  tell them if you smoke, drink alcohol, or use illegal drugs. Some items may interact with your medicine. What should I watch for while using this medicine? Your condition will be monitored carefully while you are receiving this medicine. You will need important blood work done while you are taking this medicine. This medicine can make you more sensitive to cold. Do not drink cold drinks or use ice. Cover exposed skin before coming in contact with cold temperatures or cold objects. When out in cold weather wear warm clothing and cover your mouth and nose to warm the air that goes into your lungs. Tell your doctor if you get sensitive to the cold. This drug may make you feel generally unwell. This is not uncommon, as chemotherapy can affect healthy cells as well as cancer cells. Report any side effects. Continue your course of treatment even though you feel ill unless your doctor tells you to stop. In some cases, you may be given additional medicines to help with side effects. Follow all directions for their use. Call your doctor or health care professional for advice if you get a fever, chills or sore throat, or other symptoms of a cold or flu. Do not treat yourself. This drug decreases your body's ability to fight infections. Try to avoid being around people who are sick. This medicine may increase your risk to bruise or bleed. Call your doctor or health care professional if you notice any unusual bleeding. Be careful brushing and flossing your teeth or using a toothpick because you may get an infection or bleed more easily. If you have any dental work done, tell your dentist you   are receiving this medicine. Avoid taking products that contain aspirin, acetaminophen, ibuprofen, naproxen, or ketoprofen unless instructed by your doctor. These medicines may hide a fever. Do not become pregnant while taking this medicine. Women should inform their doctor if they wish to become pregnant or think they might be  pregnant. There is a potential for serious side effects to an unborn child. Talk to your health care professional or pharmacist for more information. Do not breast-feed an infant while taking this medicine. Call your doctor or health care professional if you get diarrhea. Do not treat yourself. What side effects may I notice from receiving this medicine? Side effects that you should report to your doctor or health care professional as soon as possible:  allergic reactions like skin rash, itching or hives, swelling of the face, lips, or tongue  low blood counts - This drug may decrease the number of white blood cells, red blood cells and platelets. You may be at increased risk for infections and bleeding.  signs of infection - fever or chills, cough, sore throat, pain or difficulty passing urine  signs of decreased platelets or bleeding - bruising, pinpoint red spots on the skin, black, tarry stools, nosebleeds  signs of decreased red blood cells - unusually weak or tired, fainting spells, lightheadedness  breathing problems  chest pain, pressure  cough  diarrhea  jaw tightness  mouth sores  nausea and vomiting  pain, swelling, redness or irritation at the injection site  pain, tingling, numbness in the hands or feet  problems with balance, talking, walking  redness, blistering, peeling or loosening of the skin, including inside the mouth  trouble passing urine or change in the amount of urine Side effects that usually do not require medical attention (report to your doctor or health care professional if they continue or are bothersome):  changes in vision  constipation  hair loss  loss of appetite  metallic taste in the mouth or changes in taste  stomach pain This list may not describe all possible side effects. Call your doctor for medical advice about side effects. You may report side effects to FDA at 1-800-FDA-1088. Where should I keep my medicine? This drug  is given in a hospital or clinic and will not be stored at home. NOTE: This sheet is a summary. It may not cover all possible information. If you have questions about this medicine, talk to your doctor, pharmacist, or health care provider.  2020 Elsevier/Gold Standard (2007-12-24 17:22:47)  Leucovorin injection What is this medicine? LEUCOVORIN (loo koe VOR in) is used to prevent or treat the harmful effects of some medicines. This medicine is used to treat anemia caused by a low amount of folic acid in the body. It is also used with 5-fluorouracil (5-FU) to treat colon cancer. This medicine may be used for other purposes; ask your health care provider or pharmacist if you have questions. What should I tell my health care provider before I take this medicine? They need to know if you have any of these conditions:  anemia from low levels of vitamin B-12 in the blood  an unusual or allergic reaction to leucovorin, folic acid, other medicines, foods, dyes, or preservatives  pregnant or trying to get pregnant  breast-feeding How should I use this medicine? This medicine is for injection into a muscle or into a vein. It is given by a health care professional in a hospital or clinic setting. Talk to your pediatrician regarding the use of this medicine   in children. Special care may be needed. Overdosage: If you think you have taken too much of this medicine contact a poison control center or emergency room at once. NOTE: This medicine is only for you. Do not share this medicine with others. What if I miss a dose? This does not apply. What may interact with this medicine?  capecitabine  fluorouracil  phenobarbital  phenytoin  primidone  trimethoprim-sulfamethoxazole This list may not describe all possible interactions. Give your health care provider a list of all the medicines, herbs, non-prescription drugs, or dietary supplements you use. Also tell them if you smoke, drink alcohol, or  use illegal drugs. Some items may interact with your medicine. What should I watch for while using this medicine? Your condition will be monitored carefully while you are receiving this medicine. This medicine may increase the side effects of 5-fluorouracil, 5-FU. Tell your doctor or health care professional if you have diarrhea or mouth sores that do not get better or that get worse. What side effects may I notice from receiving this medicine? Side effects that you should report to your doctor or health care professional as soon as possible:  allergic reactions like skin rash, itching or hives, swelling of the face, lips, or tongue  breathing problems  fever, infection  mouth sores  unusual bleeding or bruising  unusually weak or tired Side effects that usually do not require medical attention (report to your doctor or health care professional if they continue or are bothersome):  constipation or diarrhea  loss of appetite  nausea, vomiting This list may not describe all possible side effects. Call your doctor for medical advice about side effects. You may report side effects to FDA at 1-800-FDA-1088. Where should I keep my medicine? This drug is given in a hospital or clinic and will not be stored at home. NOTE: This sheet is a summary. It may not cover all possible information. If you have questions about this medicine, talk to your doctor, pharmacist, or health care provider.  2020 Elsevier/Gold Standard (2007-12-03 16:50:29)  Fluorouracil, 5-FU injection What is this medicine? FLUOROURACIL, 5-FU (flure oh YOOR a sil) is a chemotherapy drug. It slows the growth of cancer cells. This medicine is used to treat many types of cancer like breast cancer, colon or rectal cancer, pancreatic cancer, and stomach cancer. This medicine may be used for other purposes; ask your health care provider or pharmacist if you have questions. COMMON BRAND NAME(S): Adrucil What should I tell my  health care provider before I take this medicine? They need to know if you have any of these conditions:  blood disorders  dihydropyrimidine dehydrogenase (DPD) deficiency  infection (especially a virus infection such as chickenpox, cold sores, or herpes)  kidney disease  liver disease  malnourished, poor nutrition  recent or ongoing radiation therapy  an unusual or allergic reaction to fluorouracil, other chemotherapy, other medicines, foods, dyes, or preservatives  pregnant or trying to get pregnant  breast-feeding How should I use this medicine? This drug is given as an infusion or injection into a vein. It is administered in a hospital or clinic by a specially trained health care professional. Talk to your pediatrician regarding the use of this medicine in children. Special care may be needed. Overdosage: If you think you have taken too much of this medicine contact a poison control center or emergency room at once. NOTE: This medicine is only for you. Do not share this medicine with others. What if I miss   a dose? It is important not to miss your dose. Call your doctor or health care professional if you are unable to keep an appointment. What may interact with this medicine?  allopurinol  cimetidine  dapsone  digoxin  hydroxyurea  leucovorin  levamisole  medicines for seizures like ethotoin, fosphenytoin, phenytoin  medicines to increase blood counts like filgrastim, pegfilgrastim, sargramostim  medicines that treat or prevent blood clots like warfarin, enoxaparin, and dalteparin  methotrexate  metronidazole  pyrimethamine  some other chemotherapy drugs like busulfan, cisplatin, estramustine, vinblastine  trimethoprim  trimetrexate  vaccines Talk to your doctor or health care professional before taking any of these medicines:  acetaminophen  aspirin  ibuprofen  ketoprofen  naproxen This list may not describe all possible interactions.  Give your health care provider a list of all the medicines, herbs, non-prescription drugs, or dietary supplements you use. Also tell them if you smoke, drink alcohol, or use illegal drugs. Some items may interact with your medicine. What should I watch for while using this medicine? Visit your doctor for checks on your progress. This drug may make you feel generally unwell. This is not uncommon, as chemotherapy can affect healthy cells as well as cancer cells. Report any side effects. Continue your course of treatment even though you feel ill unless your doctor tells you to stop. In some cases, you may be given additional medicines to help with side effects. Follow all directions for their use. Call your doctor or health care professional for advice if you get a fever, chills or sore throat, or other symptoms of a cold or flu. Do not treat yourself. This drug decreases your body's ability to fight infections. Try to avoid being around people who are sick. This medicine may increase your risk to bruise or bleed. Call your doctor or health care professional if you notice any unusual bleeding. Be careful brushing and flossing your teeth or using a toothpick because you may get an infection or bleed more easily. If you have any dental work done, tell your dentist you are receiving this medicine. Avoid taking products that contain aspirin, acetaminophen, ibuprofen, naproxen, or ketoprofen unless instructed by your doctor. These medicines may hide a fever. Do not become pregnant while taking this medicine. Women should inform their doctor if they wish to become pregnant or think they might be pregnant. There is a potential for serious side effects to an unborn child. Talk to your health care professional or pharmacist for more information. Do not breast-feed an infant while taking this medicine. Men should inform their doctor if they wish to father a child. This medicine may lower sperm counts. Do not treat  diarrhea with over the counter products. Contact your doctor if you have diarrhea that lasts more than 2 days or if it is severe and watery. This medicine can make you more sensitive to the sun. Keep out of the sun. If you cannot avoid being in the sun, wear protective clothing and use sunscreen. Do not use sun lamps or tanning beds/booths. What side effects may I notice from receiving this medicine? Side effects that you should report to your doctor or health care professional as soon as possible:  allergic reactions like skin rash, itching or hives, swelling of the face, lips, or tongue  low blood counts - this medicine may decrease the number of white blood cells, red blood cells and platelets. You may be at increased risk for infections and bleeding.  signs of infection - fever   or chills, cough, sore throat, pain or difficulty passing urine  signs of decreased platelets or bleeding - bruising, pinpoint red spots on the skin, black, tarry stools, blood in the urine  signs of decreased red blood cells - unusually weak or tired, fainting spells, lightheadedness  breathing problems  changes in vision  chest pain  mouth sores  nausea and vomiting  pain, swelling, redness at site where injected  pain, tingling, numbness in the hands or feet  redness, swelling, or sores on hands or feet  stomach pain  unusual bleeding Side effects that usually do not require medical attention (report to your doctor or health care professional if they continue or are bothersome):  changes in finger or toe nails  diarrhea  dry or itchy skin  hair loss  headache  loss of appetite  sensitivity of eyes to the light  stomach upset  unusually teary eyes This list may not describe all possible side effects. Call your doctor for medical advice about side effects. You may report side effects to FDA at 1-800-FDA-1088. Where should I keep my medicine? This drug is given in a hospital or clinic  and will not be stored at home. NOTE: This sheet is a summary. It may not cover all possible information. If you have questions about this medicine, talk to your doctor, pharmacist, or health care provider.  2020 Elsevier/Gold Standard (2007-10-02 13:53:16)  

## 2019-04-09 NOTE — Progress Notes (Signed)
Per Dr. Benay Spice: OK to treat today with platelets 80,000. Dose reduced the oxaliplatin.

## 2019-04-09 NOTE — Progress Notes (Signed)
  [place] OFFICE PROGRESS NOTE   Diagnosis: Colon cancer  INTERVAL HISTORY:   Luis [name] completed another cycle of FOLFOX on 03/26/2019.  No nausea/vomiting.  He has 1 ulcer at the lower inner lip.  This has resolved.  No diarrhea.  Cold sensitivity lasted 2 to 3 days following chemotherapy.  No neuropathy symptoms at present.  Good appetite and energy level.  Objective:  Vital signs in last 24 hours:  Blood pressure (!) 120/59, pulse (!) 58, temperature 98.5 F (36.9 C), temperature source Temporal, resp. rate 17, height '5\' 11"'$  (1.803 m), weight 226 lb 12.8 oz (102.9 kg), SpO2 98 %.   Limited physical examination secondary to distancing with the Covid pandemic HEENT: No thrush or ulcers GI: Soft and nontender, no hepatomegaly Vascular: No leg edema  Skin: Palms without erythema  Portacath/PICC-without erythema  Lab Results:  Lab Results  Component Value Date   WBC 3.8 (L) 04/09/2019   HGB 13.2 04/09/2019   HCT 38.1 (L) 04/09/2019   MCV 89.2 04/09/2019   PLT 80 (L) 04/09/2019   NEUTROABS 2.6 04/09/2019    CMP  Lab Results  Component Value Date   NA 139 03/26/2019   K 4.5 03/26/2019   CL 105 03/26/2019   CO2 26 03/26/2019   GLUCOSE 109 (H) 03/26/2019   BUN 15 03/26/2019   CREATININE 0.85 03/26/2019   CALCIUM 8.4 (L) 03/26/2019   PROT 6.4 (L) 03/26/2019   ALBUMIN 3.7 03/26/2019   AST 44 (H) 03/26/2019   ALT 42 03/26/2019   ALKPHOS 148 (H) 03/26/2019   BILITOT 0.5 03/26/2019   GFRNONAA >60 03/26/2019   GFRAA >60 03/26/2019    Lab Results  Component Value Date   CEA1 6.82 (H) 01/24/2019     Medications: I have reviewed the patient's current medications.   Assessment/Plan: 1. Adenocarcinoma of the cecum, stage IIA(T3N0), status post a right colectomy 07/26/2017 ? 0/16 lymph nodes positive, no lymphovascular invasion, perineural invasion present ? MSI-stable, no loss of mismatch repair protein expression ? Surveillance colonoscopy  07/26/2018-no polyps found ? Elevated CEA June 2020, persistently elevated August 2020 ? CTs 02/07/2019- bilateral liver metastases, no extrahepatic metastatic disease, changes of sarcoidosis in the chest ? Biopsy liver lesion 03/04/2019-metastatic adenocarcinoma, with morphology consistent with metastatic colonic adenocarcinoma ? Cycle 1 FOLFOX 03/12/2019 ? Cycle 2 FOLFOX 03/26/2019, oxaliplatin dose reduced secondary to thrombocytopenia ? Cycle 3 FOLFOX 04/09/2019, oxaliplatin further dose reduced and aspirin held secondary to thrombocytopenia 2. History of colon polyps 3. Microcytic anemia January 2019 4. Sarcoidosis 5. Coronary artery disease 6. Port-A-Cath placement, interventional radiology, 03/04/2019 7. Thrombocytopenia, mild thrombocytopenia predating chemotherapy   Disposition: Mr. Berwick appears stable.  He has tolerated the chemotherapy well.  He has progressive thrombocytopenia.  We dose reduced the oxaliplatin again today.  He will hold aspirin.  He will call for bleeding or bruising. Mr. Balan will return for an office visit and cycle 4 FOLFOX in 2 weeks.  We will follow-up on the CEA from today.  Betsy Coder, MD  04/09/2019  10:22 AM

## 2019-04-10 ENCOUNTER — Telehealth: Payer: Self-pay | Admitting: Oncology

## 2019-04-10 NOTE — Telephone Encounter (Signed)
Scheduled appt per 10/28 los - added additional appts to what is already scheduled. Pt to get an updated schedule next visit.

## 2019-04-11 ENCOUNTER — Inpatient Hospital Stay: Payer: Medicare Other

## 2019-04-11 ENCOUNTER — Other Ambulatory Visit: Payer: Self-pay

## 2019-04-11 VITALS — BP 122/67 | HR 61 | Temp 98.6°F | Resp 18

## 2019-04-11 DIAGNOSIS — C180 Malignant neoplasm of cecum: Secondary | ICD-10-CM

## 2019-04-11 DIAGNOSIS — Z5111 Encounter for antineoplastic chemotherapy: Secondary | ICD-10-CM | POA: Diagnosis not present

## 2019-04-11 MED ORDER — SODIUM CHLORIDE 0.9% FLUSH
10.0000 mL | INTRAVENOUS | Status: DC | PRN
  Administered 2019-04-11: 10 mL
  Filled 2019-04-11: qty 10

## 2019-04-11 MED ORDER — HEPARIN SOD (PORK) LOCK FLUSH 100 UNIT/ML IV SOLN
500.0000 [IU] | Freq: Once | INTRAVENOUS | Status: AC | PRN
  Administered 2019-04-11: 13:00:00 500 [IU]
  Filled 2019-04-11: qty 5

## 2019-04-18 ENCOUNTER — Other Ambulatory Visit: Payer: Self-pay | Admitting: Oncology

## 2019-04-23 ENCOUNTER — Inpatient Hospital Stay: Payer: Medicare Other | Admitting: Oncology

## 2019-04-23 ENCOUNTER — Other Ambulatory Visit: Payer: Self-pay

## 2019-04-23 ENCOUNTER — Inpatient Hospital Stay: Payer: Medicare Other

## 2019-04-23 ENCOUNTER — Inpatient Hospital Stay: Payer: Medicare Other | Attending: Oncology

## 2019-04-23 VITALS — BP 125/63 | HR 63 | Temp 98.5°F | Resp 17 | Ht 71.0 in | Wt 230.4 lb

## 2019-04-23 DIAGNOSIS — Z95828 Presence of other vascular implants and grafts: Secondary | ICD-10-CM

## 2019-04-23 DIAGNOSIS — C787 Secondary malignant neoplasm of liver and intrahepatic bile duct: Secondary | ICD-10-CM | POA: Insufficient documentation

## 2019-04-23 DIAGNOSIS — Z5111 Encounter for antineoplastic chemotherapy: Secondary | ICD-10-CM | POA: Diagnosis not present

## 2019-04-23 DIAGNOSIS — C180 Malignant neoplasm of cecum: Secondary | ICD-10-CM

## 2019-04-23 LAB — CMP (CANCER CENTER ONLY)
ALT: 35 U/L (ref 0–44)
AST: 40 U/L (ref 15–41)
Albumin: 3.7 g/dL (ref 3.5–5.0)
Alkaline Phosphatase: 107 U/L (ref 38–126)
Anion gap: 7 (ref 5–15)
BUN: 16 mg/dL (ref 8–23)
CO2: 26 mmol/L (ref 22–32)
Calcium: 8.5 mg/dL — ABNORMAL LOW (ref 8.9–10.3)
Chloride: 103 mmol/L (ref 98–111)
Creatinine: 0.9 mg/dL (ref 0.61–1.24)
GFR, Est AFR Am: >60 mL/min (ref >60)
GFR, Estimated: >60 mL/min (ref >60)
Glucose, Bld: 99 mg/dL (ref 70–99)
Potassium: 4.6 mmol/L (ref 3.5–5.1)
Sodium: 136 mmol/L (ref 135–145)
Total Bilirubin: 0.6 mg/dL (ref 0.3–1.2)
Total Protein: 6.4 g/dL — ABNORMAL LOW (ref 6.5–8.1)

## 2019-04-23 LAB — CBC WITH DIFFERENTIAL (CANCER CENTER ONLY)
Abs Immature Granulocytes: 0.01 10*3/uL (ref 0.00–0.07)
Basophils Absolute: 0.1 10*3/uL (ref 0.0–0.1)
Basophils Relative: 1 %
Eosinophils Absolute: 0.1 10*3/uL (ref 0.0–0.5)
Eosinophils Relative: 3 %
HCT: 37.7 % — ABNORMAL LOW (ref 39.0–52.0)
Hemoglobin: 13.1 g/dL (ref 13.0–17.0)
Immature Granulocytes: 0 %
Lymphocytes Relative: 11 %
Lymphs Abs: 0.5 10*3/uL — ABNORMAL LOW (ref 0.7–4.0)
MCH: 31.3 pg (ref 26.0–34.0)
MCHC: 34.7 g/dL (ref 30.0–36.0)
MCV: 90.2 fL (ref 80.0–100.0)
Monocytes Absolute: 0.8 10*3/uL (ref 0.1–1.0)
Monocytes Relative: 17 %
Neutro Abs: 3.1 10*3/uL (ref 1.7–7.7)
Neutrophils Relative %: 68 %
Platelet Count: 82 10*3/uL — ABNORMAL LOW (ref 150–400)
RBC: 4.18 MIL/uL — ABNORMAL LOW (ref 4.22–5.81)
RDW: 15.7 % — ABNORMAL HIGH (ref 11.5–15.5)
WBC Count: 4.6 10*3/uL (ref 4.0–10.5)
nRBC: 0 % (ref 0.0–0.2)

## 2019-04-23 MED ORDER — DEXAMETHASONE SODIUM PHOSPHATE 10 MG/ML IJ SOLN
INTRAMUSCULAR | Status: AC
  Filled 2019-04-23: qty 1

## 2019-04-23 MED ORDER — DEXAMETHASONE SODIUM PHOSPHATE 10 MG/ML IJ SOLN
10.0000 mg | Freq: Once | INTRAMUSCULAR | Status: AC
  Administered 2019-04-23: 10 mg via INTRAVENOUS

## 2019-04-23 MED ORDER — DEXTROSE 5 % IV SOLN
Freq: Once | INTRAVENOUS | Status: AC
  Administered 2019-04-23: 13:00:00 via INTRAVENOUS
  Filled 2019-04-23: qty 250

## 2019-04-23 MED ORDER — SODIUM CHLORIDE 0.9 % IV SOLN
2400.0000 mg/m2 | INTRAVENOUS | Status: DC
  Administered 2019-04-23: 5450 mg via INTRAVENOUS
  Filled 2019-04-23: qty 109

## 2019-04-23 MED ORDER — LEUCOVORIN CALCIUM INJECTION 350 MG
400.0000 mg/m2 | Freq: Once | INTRAVENOUS | Status: AC
  Administered 2019-04-23: 912 mg via INTRAVENOUS
  Filled 2019-04-23: qty 45.6

## 2019-04-23 MED ORDER — OXALIPLATIN CHEMO INJECTION 100 MG/20ML
55.0000 mg/m2 | Freq: Once | INTRAVENOUS | Status: AC
  Administered 2019-04-23: 125 mg via INTRAVENOUS
  Filled 2019-04-23: qty 20

## 2019-04-23 MED ORDER — FLUOROURACIL CHEMO INJECTION 2.5 GM/50ML
400.0000 mg/m2 | Freq: Once | INTRAVENOUS | Status: AC
  Administered 2019-04-23: 16:00:00 900 mg via INTRAVENOUS
  Filled 2019-04-23: qty 18

## 2019-04-23 MED ORDER — PALONOSETRON HCL INJECTION 0.25 MG/5ML
INTRAVENOUS | Status: AC
  Filled 2019-04-23: qty 5

## 2019-04-23 MED ORDER — SODIUM CHLORIDE 0.9% FLUSH
10.0000 mL | INTRAVENOUS | Status: DC | PRN
  Filled 2019-04-23: qty 10

## 2019-04-23 MED ORDER — SODIUM CHLORIDE 0.9% FLUSH
10.0000 mL | INTRAVENOUS | Status: DC | PRN
  Administered 2019-04-23: 12:00:00 10 mL
  Filled 2019-04-23: qty 10

## 2019-04-23 MED ORDER — PALONOSETRON HCL INJECTION 0.25 MG/5ML
0.2500 mg | Freq: Once | INTRAVENOUS | Status: AC
  Administered 2019-04-23: 0.25 mg via INTRAVENOUS

## 2019-04-23 NOTE — Progress Notes (Signed)
Per Dr. Benay Spice: OK to treat with platelet count 82,000

## 2019-04-23 NOTE — Progress Notes (Signed)
  [place] OFFICE PROGRESS NOTE   Diagnosis: Colon cancer  INTERVAL HISTORY:   Luis [name] completed another cycle of FOLFOX on 04/09/2019.  No nausea or mouth sores.  He had cold sensitivity following chemotherapy.  This has resolved.  He noticed a "pins-and-needles "sensation in his feet today.  No peripheral numbness at present.  Mild diarrhea did not require medication.  Objective:  Vital signs in last 24 hours:  Blood pressure 125/63, pulse 63, temperature 98.5 F (36.9 C), temperature source Temporal, resp. rate 17, height '5\' 11"'$  (1.803 m), weight 230 lb 6.4 oz (104.5 kg), SpO2 98 %.     Limited physical examination secondary to distancing Covid pandemic GI: No hepatomegaly, nontender Vascular: No leg edema Neuro: The vibratory sense is intact in most of the fingertips, mild loss of vibratory sense at a few fingertips Skin: Palms without erythema  Portacath/PICC-without erythema  Lab Results:  Lab Results  Component Value Date   WBC 4.6 04/23/2019   HGB 13.1 04/23/2019   HCT 37.7 (L) 04/23/2019   MCV 90.2 04/23/2019   PLT 82 (L) 04/23/2019   NEUTROABS 3.1 04/23/2019    CMP  Lab Results  Component Value Date   NA 138 04/09/2019   K 4.6 04/09/2019   CL 104 04/09/2019   CO2 26 04/09/2019   GLUCOSE 118 (H) 04/09/2019   BUN 18 04/09/2019   CREATININE 0.89 04/09/2019   CALCIUM 8.7 (L) 04/09/2019   PROT 6.2 (L) 04/09/2019   ALBUMIN 3.6 04/09/2019   AST 40 04/09/2019   ALT 37 04/09/2019   ALKPHOS 107 04/09/2019   BILITOT 0.5 04/09/2019   GFRNONAA >60 04/09/2019   GFRAA >60 04/09/2019    Lab Results  Component Value Date   CEA1 6.69 (H) 04/09/2019     Medications: I have reviewed the patient's current medications.   Assessment/Plan: 1. Adenocarcinoma of the cecum, stage IIA(T3N0), status post a right colectomy 07/26/2017 ? 0/16 lymph nodes positive, no lymphovascular invasion, perineural invasion present ? MSI-stable, no loss of  mismatch repair protein expression ? Surveillance colonoscopy 07/26/2018-no polyps found ? Elevated CEA June 2020, persistently elevated August 2020 ? CTs 02/07/2019- bilateral liver metastases, no extrahepatic metastatic disease, changes of sarcoidosis in the chest ? Biopsy liver lesion 03/04/2019-metastatic adenocarcinoma, with morphology consistent with metastatic colonic adenocarcinoma ? Cycle 1 FOLFOX 03/12/2019 ? Cycle 2 FOLFOX 03/26/2019, oxaliplatin dose reduced secondary to thrombocytopenia ? Cycle 3 FOLFOX 04/09/2019, oxaliplatin further dose reduced and aspirin held secondary to thrombocytopenia ? Cycle 4 FOLFOX 04/23/2019 2. History of colon polyps 3. Microcytic anemia January 2019 4. Sarcoidosis 5. Coronary artery disease 6. Port-A-Cath placement, interventional radiology, 03/04/2019 7. Thrombocytopenia, mild thrombocytopenia predating chemotherapy  Disposition: Luis [name] has completed 3 cycles of FOLFOX.  He has tolerated the chemotherapy well.  He has stable mild thrombocytopenia.  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  He will complete cycle 4 FOLFOX today. Luis [name] will return for an office visit and chemotherapy in 2 weeks.  He will undergo a restaging CT evaluation after cycle 5 FOLFOX.  Betsy Coder, MD  04/23/2019  12:40 PM

## 2019-04-23 NOTE — Progress Notes (Signed)
Cardiology Office Note   Date:  04/25/2019   ID:  Luis [name], DOB Aug 28, 1940, MRN [id number]  PCP:  [name], MD    No chief complaint on file.  CAD  Wt Readings from Last 3 Encounters:  04/25/19 238 lb 12.8 oz (108.3 kg)  04/23/19 230 lb 6.4 oz (104.5 kg)  04/09/19 226 lb 12.8 oz (102.9 kg)       History of Present Illness: Luis [name] is a 78 y.o. male  with history of CADstatus post DES to the diagonal 08/2014 ramus intermedius was totally occluded at the time, eventually underwent CTO PCI with overlapping DES x2 to the ramus 02/2015. Chest pain 05/2016 relieved with nitroglycerin and treated with Ranexa but has been off. NST 12/2017 normal LVEF normal study. Found to have a totally occluded right carotid on Dopplers 04/2018 but other vessels open. HTN, HLD,Also has adenocarcinoma of the colon status post colectomy.  Patient addedto office schedule on 7/15/20for increased dyspnea on exertion and left arm pain relieved with rest.Was walking 3 miles daily but had tocut back to 1 1/2 miles daily because of shortness of breath.  No chest tightness. Similar to when he had his stents placed. Didn't have chest tightness then. Hadgained 10-12 lbs.Given his ongoing symptoms he was referred for outpatient cardiac cath.  Patient underwent cardiac cath 01/02/2019 with DES to the LAD and balloon angioplasty to the diagonal bifurcation. Plan for aspirin and Plavix for at least 6 months.  He needed liver biopsy.He has had some CP with eating certin foods and with exertion but improved with NTG and amlodipine.  Sx resolved.  He is getting chemotherapy until Thanksgiving- then another scan, may get nausea, but this has been ok so far.  Surgery planned once tumor has shrunk.    Denies : Chest pain. Dizziness. Leg edema. Nitroglycerin use. Orthopnea. Palpitations. Paroxysmal nocturnal dyspnea. Shortness of breath. Syncope.   Still working.  Fatigue on the day of  chemo.    He has been walking some.     Past Medical History:  Diagnosis Date  . Adenocarcinoma of cecum ([facility]) 06/21/2017  . Arthritis    "mid back; hands; knees" (02/24/2015)  . Basal cell carcinoma    left shoulder; mid chest; right eyelid (02/24/2015)  . CAD (coronary artery disease)    a. 08/2014 Cath/PCI: LM nl, LAD 30p, D1 95 (2.25x12 Resolute Integrity DES), LCX small, RI 100 (attempted PCI) - branches fill via L->L collats, RCA dominant, nl, RPDA/PLA nl, EF 60^. b. 02/24/2015 PCI CTO of Ramus DES x2.  . Chronic bronchitis ([facility])    hx  . Diverticulosis 05/2005  . Elevated lipase   . Fuchs' corneal dystrophy   . History of adenomatous polyp of colon 05/2005   8 mm adenoma  . History of blood transfusion    "related to some of my surgeries"  . Hyperlipidemia   . Hypertension   . Iron deficiency anemia due to chronic blood loss 06/21/2017  . Prostate cancer ([facility]) 2011   S/P seed implant  . Sarcoidosis   . Thrombocytopenia ([facility])    a. Noted on prior labs, unclear of what w/u done.  . Trifascicular block     Past Surgical History:  Procedure Laterality Date  . BASAL CELL CARCINOMA EXCISION Left    shoulder  . CARDIAC CATHETERIZATION N/A 02/24/2015   Procedure: Coronary/Bypass Graft CTO Intervention;  Surgeon: Jettie Booze, MD;  Location: [facility];  Service: Cardiovascular;  Laterality: N/A;  . CARDIAC CATHETERIZATION  02/24/2015   Procedure: Coronary/Graft Atherectomy;  Surgeon: Jettie Booze, MD;  Location: [facility];  Service: Cardiovascular;;  . CATARACT EXTRACTION W/ INTRAOCULAR LENS  IMPLANT, BILATERAL Bilateral ~ 2011-2012  . COLONOSCOPY W/ POLYPECTOMY  06/08/2005 and 10/18/2010   8 mm adenoma 2006, none 2012. Diverticulosis and internal hemorrhoids.  . CORNEAL TRANSPLANT Bilateral ~ 2011-2012   "@ same time as cataract OR"  . CORONARY ANGIOPLASTY WITH STENT PLACEMENT  08/2014; 02/24/2015   "1 stent + 1 stent"  . CORONARY STENT INTERVENTION  N/A 01/02/2019   Procedure: CORONARY STENT INTERVENTION;  Surgeon: Jettie Booze, MD;  Location: [facility];  Service: Cardiovascular;  Laterality: N/A;  . EYE SURGERY    . FINGER SURGERY Right 2014   "reattached middle finger"  . INSERTION PROSTATE RADIATION SEED  ~ 2011  . IR IMAGING GUIDED PORT INSERTION  03/04/2019  . IR US GUIDE BX ASP/DRAIN  03/04/2019  . LAPAROSCOPIC CHOLECYSTECTOMY  2015  . LAPAROSCOPIC PARTIAL COLECTOMY N/A 07/26/2017   Procedure: LAPAROSCOPIC PARTIAL COLECTOMY, EXCISION SCROTAL CYST;  Surgeon: Greer Pickerel, MD;  Location: [facility];  Service: General;  Laterality: N/A;  . LEFT HEART CATH AND CORONARY ANGIOGRAPHY N/A 01/02/2019   Procedure: LEFT HEART CATH AND CORONARY ANGIOGRAPHY;  Surgeon: Jettie Booze, MD;  Location: [facility];  Service: Cardiovascular;  Laterality: N/A;  . LEFT HEART CATHETERIZATION WITH CORONARY ANGIOGRAM N/A 08/12/2014   Procedure: LEFT HEART CATHETERIZATION WITH CORONARY ANGIOGRAM;  Surgeon: Blane Ohara, MD;  Location: [facility];  Service: Cardiovascular;  Laterality: N/A;  . LYMPH NODE BIOPSY     mid chest  . MOHS SURGERY Right ~ 2011   "eyelid; for basal cell"  . THOROCOTOMY WITH LOBECTOMY Right ~ 1991   partial removal right lung  . TONSILLECTOMY  ~ 1950     Current Outpatient Medications  Medication Sig Dispense Refill  . amLODipine (NORVASC) 5 MG tablet Take 1 tablet (5 mg total) by mouth 2 (two) times daily. 180 tablet 3  . aspirin EC 81 MG tablet Take 81 mg by mouth daily.    [name] CANNABIDIOL PO Take 12 drops by mouth 2 (two) times a day. CBD OIL    . Cholecalciferol (VITAMIN D3) 2000 units capsule Take 2,000 Units by mouth every evening.     . clopidogrel (PLAVIX) 75 MG tablet TAKE 1 TABLET (75 MG TOTAL) BY MOUTH DAILY WITH BREAKFAST. 90 tablet 3  . clotrimazole (LOTRIMIN) 1 % cream Apply 1 application topically 3 (three) times daily as needed (skin irritation/rash).    [name] lidocaine-prilocaine (EMLA)  cream Apply 1 application topically as directed. Apply to port 1 hour prior to stick and cover with plastic wrap 30 g 1  . metoprolol tartrate (LOPRESSOR) 25 MG tablet TAKE 0.5 TABLETS BY MOUTH TWICE A DAY 90 tablet 2  . Multiple Vitamin (MULTIVITAMIN WITH MINERALS) TABS tablet Take 1 tablet by mouth every evening. One-A-Day for Men 50+    . nitroGLYCERIN (NITROSTAT) 0.4 MG SL tablet Place 1 tablet (0.4 mg total) under the tongue every 5 (five) minutes as needed for chest pain. X 3 doses 25 tablet 4  . prochlorperazine (COMPAZINE) 10 MG tablet Take 1 tablet (10 mg total) by mouth every 6 (six) hours as needed. 60 tablet 1  . rosuvastatin (CRESTOR) 20 MG tablet TAKE 1 TABLET BY MOUTH EVERY DAY 90 tablet 2  . sildenafil (REVATIO) 20 MG tablet  Take 60 mg by mouth as needed (for ED).     [name] silodosin (RAPAFLO) 8 MG CAPS capsule Take 8 mg by mouth daily as needed (for urination).     . traMADol (ULTRAM) 50 MG tablet Take 100 mg by mouth 2 (two) times a day.     . triamcinolone cream (KENALOG) 0.5 % Apply 1 application topically 3 (three) times daily as needed (rash/skin irritation.). APPLY TOPICALLY 2-3 TIMES A DAY TO SCAR FOR 2-3 WEEKS THEN AS NEEDED  2   No current facility-administered medications for this visit.     Allergies:   Patient has no known allergies.    Social History:  The patient  reports that he has quit smoking. His smoking use included cigars. He smoked 0.00 packs per day for 59.00 years. He has never used smokeless tobacco. He reports current alcohol use of about 2.0 standard drinks of alcohol per week. He reports that he does not use drugs.   Family History:  The patient's family history includes Coronary artery disease (age of onset: 51) in his sister; Heart attack in his sister; Heart disease (age of onset: 20) in his father.    ROS:  Please see the history of present illness.   Otherwise, review of systems are positive for .   All other systems are reviewed and negative.     PHYSICAL EXAM: VS:  BP 126/60   Pulse 85   Ht 5\' 11"  (1.803 m)   Wt 238 lb 12.8 oz (108.3 kg)   SpO2 95%   BMI 33.31 kg/m  , BMI Body mass index is 33.31 kg/m. GEN: Well nourished, well developed, in no acute distress  HEENT: normal  Neck: no JVD, carotid bruits, or masses Cardiac: RRR; no murmurs, rubs, or gallops,no edema  Respiratory:  clear to auscultation bilaterally, normal work of breathing GI: soft, nontender, nondistended, + BS MS: no deformity or atrophy  Skin: warm and dry, no rash Neuro:  Strength and sensation are intact Psych: euthymic mood, full affect   EKG:   The ekg ordered 12/2018 demonstrates NSR, bifascicular block   Recent Labs: 04/23/2019: ALT 35; BUN 16; Creatinine 0.90; Hemoglobin 13.1; Platelet Count 82; Potassium 4.6; Sodium 136   Lipid Panel    Component Value Date/Time   CHOL 155 10/13/2015 1005   TRIG 186 (H) 10/13/2015 1005   HDL 49 10/13/2015 1005   CHOLHDL 3.2 10/13/2015 1005   VLDL 37 (H) 10/13/2015 1005   LDLCALC 69 10/13/2015 1005     Other studies Reviewed: Additional studies/ records that were reviewed today with results demonstrating: labs reviewed.   ASSESSMENT AND PLAN:  1. CAD: Clopidogrel monotherapy at this time. No angina.  COntinue aggressive secondary prevention.   2. HTN: The current medical regimen is effective;  continue present plan and medications. 3. Hyperlipidemia: Will get fasting lipid panel with PMD. - Dr. Lisbeth Ply. 4. Carotid disease: KNown occluded carotid artery. No sx. Routine Dopplers.   Current medicines are reviewed at length with the patient today.  The patient concerns regarding his medicines were addressed.  The following changes have been made:  No change  Labs/ tests ordered today include:  No orders of the defined types were placed in this encounter.   Recommend 150 minutes/week of aerobic exercise Low fat, low carb, high fiber diet recommended  Disposition:   FU in 1  year   Signed, Larae Grooms, MD  04/25/2019 9:22 AM    [facility]  [address], [place], [place]  [address] Phone: [phone]; Fax: [phone]

## 2019-04-23 NOTE — Patient Instructions (Signed)
[facility] Discharge Instructions for Patients Receiving Chemotherapy  Today you received the following chemotherapy agents Oxaliplatin, leucovorin, 5FU (Fluorouraci)  To help prevent nausea and vomiting after your treatment, we encourage you to take your nausea medication as directed   If you develop nausea and vomiting that is not controlled by your nausea medication, call the clinic.   BELOW ARE SYMPTOMS THAT SHOULD BE REPORTED IMMEDIATELY:  *FEVER GREATER THAN 100.5 F  *CHILLS WITH OR WITHOUT FEVER  NAUSEA AND VOMITING THAT IS NOT CONTROLLED WITH YOUR NAUSEA MEDICATION  *UNUSUAL SHORTNESS OF BREATH  *UNUSUAL BRUISING OR BLEEDING  TENDERNESS IN MOUTH AND THROAT WITH OR WITHOUT PRESENCE OF ULCERS  *URINARY PROBLEMS  *BOWEL PROBLEMS  UNUSUAL RASH Items with * indicate a potential emergency and should be followed up as soon as possible.  Feel free to call the clinic should you have any questions or concerns. The clinic phone number is [phone].  Please show the [place] at check-in to the Emergency Department and triage nurse.  Oxaliplatin Injection What is this medicine? OXALIPLATIN (ox AL i PLA tin) is a chemotherapy drug. It targets fast dividing cells, like cancer cells, and causes these cells to die. This medicine is used to treat cancers of the colon and rectum, and many other cancers. This medicine may be used for other purposes; ask your health care provider or pharmacist if you have questions. COMMON BRAND NAME(S): Eloxatin What should I tell my health care provider before I take this medicine? They need to know if you have any of these conditions:  kidney disease  an unusual or allergic reaction to oxaliplatin, other chemotherapy, other medicines, foods, dyes, or preservatives  pregnant or trying to get pregnant  breast-feeding How should I use this medicine? This drug is given as an infusion into a vein. It is administered in a  hospital or clinic by a specially trained health care professional. Talk to your pediatrician regarding the use of this medicine in children. Special care may be needed. Overdosage: If you think you have taken too much of this medicine contact a poison control center or emergency room at once. NOTE: This medicine is only for you. Do not share this medicine with others. What if I miss a dose? It is important not to miss a dose. Call your doctor or health care professional if you are unable to keep an appointment. What may interact with this medicine?  medicines to increase blood counts like filgrastim, pegfilgrastim, sargramostim  probenecid  some antibiotics like amikacin, gentamicin, neomycin, polymyxin B, streptomycin, tobramycin  zalcitabine Talk to your doctor or health care professional before taking any of these medicines:  acetaminophen  aspirin  ibuprofen  ketoprofen  naproxen This list may not describe all possible interactions. Give your health care provider a list of all the medicines, herbs, non-prescription drugs, or dietary supplements you use. Also tell them if you smoke, drink alcohol, or use illegal drugs. Some items may interact with your medicine. What should I watch for while using this medicine? Your condition will be monitored carefully while you are receiving this medicine. You will need important blood work done while you are taking this medicine. This medicine can make you more sensitive to cold. Do not drink cold drinks or use ice. Cover exposed skin before coming in contact with cold temperatures or cold objects. When out in cold weather wear warm clothing and cover your mouth and nose to warm the air that goes  into your lungs. Tell your doctor if you get sensitive to the cold. This drug may make you feel generally unwell. This is not uncommon, as chemotherapy can affect healthy cells as well as cancer cells. Report any side effects. Continue your course of  treatment even though you feel ill unless your doctor tells you to stop. In some cases, you may be given additional medicines to help with side effects. Follow all directions for their use. Call your doctor or health care professional for advice if you get a fever, chills or sore throat, or other symptoms of a cold or flu. Do not treat yourself. This drug decreases your body's ability to fight infections. Try to avoid being around people who are sick. This medicine may increase your risk to bruise or bleed. Call your doctor or health care professional if you notice any unusual bleeding. Be careful brushing and flossing your teeth or using a toothpick because you may get an infection or bleed more easily. If you have any dental work done, tell your dentist you are receiving this medicine. Avoid taking products that contain aspirin, acetaminophen, ibuprofen, naproxen, or ketoprofen unless instructed by your doctor. These medicines may hide a fever. Do not become pregnant while taking this medicine. Women should inform their doctor if they wish to become pregnant or think they might be pregnant. There is a potential for serious side effects to an unborn child. Talk to your health care professional or pharmacist for more information. Do not breast-feed an infant while taking this medicine. Call your doctor or health care professional if you get diarrhea. Do not treat yourself. What side effects may I notice from receiving this medicine? Side effects that you should report to your doctor or health care professional as soon as possible:  allergic reactions like skin rash, itching or hives, swelling of the face, lips, or tongue  low blood counts - This drug may decrease the number of white blood cells, red blood cells and platelets. You may be at increased risk for infections and bleeding.  signs of infection - fever or chills, cough, sore throat, pain or difficulty passing urine  signs of decreased  platelets or bleeding - bruising, pinpoint red spots on the skin, black, tarry stools, nosebleeds  signs of decreased red blood cells - unusually weak or tired, fainting spells, lightheadedness  breathing problems  chest pain, pressure  cough  diarrhea  jaw tightness  mouth sores  nausea and vomiting  pain, swelling, redness or irritation at the injection site  pain, tingling, numbness in the hands or feet  problems with balance, talking, walking  redness, blistering, peeling or loosening of the skin, including inside the mouth  trouble passing urine or change in the amount of urine Side effects that usually do not require medical attention (report to your doctor or health care professional if they continue or are bothersome):  changes in vision  constipation  hair loss  loss of appetite  metallic taste in the mouth or changes in taste  stomach pain This list may not describe all possible side effects. Call your doctor for medical advice about side effects. You may report side effects to FDA at 1-800-FDA-1088. Where should I keep my medicine? This drug is given in a hospital or clinic and will not be stored at home. NOTE: This sheet is a summary. It may not cover all possible information. If you have questions about this medicine, talk to your doctor, pharmacist, or health care provider.  2020 Elsevier/Gold Standard (2007-12-24 17:22:47)   Leucovorin injection What is this medicine? LEUCOVORIN (loo koe VOR in) is used to prevent or treat the harmful effects of some medicines. This medicine is used to treat anemia caused by a low amount of folic acid in the body. It is also used with 5-fluorouracil (5-FU) to treat colon cancer. This medicine may be used for other purposes; ask your health care provider or pharmacist if you have questions. What should I tell my health care provider before I take this medicine? They need to know if you have any of these  conditions:  anemia from low levels of vitamin B-12 in the blood  an unusual or allergic reaction to leucovorin, folic acid, other medicines, foods, dyes, or preservatives  pregnant or trying to get pregnant  breast-feeding How should I use this medicine? This medicine is for injection into a muscle or into a vein. It is given by a health care professional in a hospital or clinic setting. Talk to your pediatrician regarding the use of this medicine in children. Special care may be needed. Overdosage: If you think you have taken too much of this medicine contact a poison control center or emergency room at once. NOTE: This medicine is only for you. Do not share this medicine with others. What if I miss a dose? This does not apply. What may interact with this medicine?  capecitabine  fluorouracil  phenobarbital  phenytoin  primidone  trimethoprim-sulfamethoxazole This list may not describe all possible interactions. Give your health care provider a list of all the medicines, herbs, non-prescription drugs, or dietary supplements you use. Also tell them if you smoke, drink alcohol, or use illegal drugs. Some items may interact with your medicine. What should I watch for while using this medicine? Your condition will be monitored carefully while you are receiving this medicine. This medicine may increase the side effects of 5-fluorouracil, 5-FU. Tell your doctor or health care professional if you have diarrhea or mouth sores that do not get better or that get worse. What side effects may I notice from receiving this medicine? Side effects that you should report to your doctor or health care professional as soon as possible:  allergic reactions like skin rash, itching or hives, swelling of the face, lips, or tongue  breathing problems  fever, infection  mouth sores  unusual bleeding or bruising  unusually weak or tired Side effects that usually do not require medical  attention (report to your doctor or health care professional if they continue or are bothersome):  constipation or diarrhea  loss of appetite  nausea, vomiting This list may not describe all possible side effects. Call your doctor for medical advice about side effects. You may report side effects to FDA at 1-800-FDA-1088. Where should I keep my medicine? This drug is given in a hospital or clinic and will not be stored at home. NOTE: This sheet is a summary. It may not cover all possible information. If you have questions about this medicine, talk to your doctor, pharmacist, or health care provider.  2020 Elsevier/Gold Standard (2007-12-03 16:50:29)   Fluorouracil, 5FU; Diclofenac topical cream What is this medicine? FLUOROURACIL; DICLOFENAC (flure oh YOOR a sil; dye KLOE fen ak) is a combination of a topical chemotherapy agent and non-steroidal anti-inflammatory drug (NSAID). It is used on the skin to treat skin cancer and skin conditions that could become cancer. This medicine may be used for other purposes; ask your health care provider or pharmacist  if you have questions. COMMON BRAND NAME(S): FLUORAC What should I tell my health care provider before I take this medicine? They need to know if you have any of these conditions:  bleeding problems  cigarette smoker  DPD enzyme deficiency  heart disease  high blood pressure  if you frequently drink alcohol containing drinks  kidney disease  liver disease  open or infected skin  stomach problems  swelling or open sores at the treatment site  recent or planned coronary artery bypass graft (CABG) surgery  an unusual or allergic reaction to fluorouracil, diclofenac, aspirin, other NSAIDs, other medicines, foods, dyes, or preservatives  pregnant or trying to get pregnant  breast-feeding How should I use this medicine? This medicine is only for use on the skin. Follow the directions on the prescription label. Wash hands  before and after use. Wash affected area and gently pat dry. To apply this medicine use a cotton-tipped applicator, or use gloves if applying with fingertips. If applied with unprotected fingertips, it is very important to wash your hands well after you apply this medicine. Avoid applying to the eyes, nose, or mouth. Apply enough medicine to cover the affected area. You can cover the area with a light gauze dressing, but do not use tight or air-tight dressings. Finish the full course prescribed by your doctor or health care professional, even if you think your condition is better. Do not stop taking except on the advice of your doctor or health care professional. Talk to your pediatrician regarding the use of this medicine in children. Special care may be needed. Overdosage: If you think you have taken too much of this medicine contact a poison control center or emergency room at once. NOTE: This medicine is only for you. Do not share this medicine with others. What if I miss a dose? If you miss a dose, apply it as soon as you can. If it is almost time for your next dose, only use that dose. Do not apply extra doses. Contact your doctor or health care professional if you miss more than one dose. What may interact with this medicine? Interactions are not expected. Do not use any other skin products without telling your doctor or health care professional. This list may not describe all possible interactions. Give your health care provider a list of all the medicines, herbs, non-prescription drugs, or dietary supplements you use. Also tell them if you smoke, drink alcohol, or use illegal drugs. Some items may interact with your medicine. What should I watch for while using this medicine? Visit your doctor or healthcare provider for checks on your progress. You will need to use this medicine for 2 to 6 weeks. This may be longer depending on the condition being treated. You may not see full healing for another  1 to 2 months after you stop using the medicine. This medicine may cause serious skin reactions. They can happen weeks to months after starting the medicine. Contact your healthcare provider right away if you notice fevers or flu-like symptoms with a rash. The rash may be red or purple and then turn into blisters or peeling of the skin. Or, you might notice a red rash with swelling of the face, lips or lymph nodes in your neck or under your arms. Treated areas of skin can look unsightly during and for several weeks after treatment with this medicine. This medicine can make you more sensitive to the sun. Keep out of the sun. If you cannot avoid  being in the sun, wear protective clothing and use sunscreen. Do not use sun lamps or tanning beds/booths. If a pet comes in contact with the area where this medicine was applied to your skin or if it is ingested, they may have a serious risk of side effects. If accidental contact happens, the skin of the pet should be washed right away with soap and water. Contact your vet right away if your pet becomes exposed. Do not become pregnant while taking this medicine. Women should inform their doctor if they wish to become pregnant or think they might be pregnant. There is a potential for serious side effects to an unborn child. Talk to your healthcare provider or pharmacist for more information. What side effects may I notice from receiving this medicine? Side effects that you should report to your doctor or health care professional as soon as possible:  allergic reactions like skin rash, itching or hives, swelling of the face, lips, or tongue  black or bloody stools, blood in the urine or vomit  blurred vision  chest pain  difficulty breathing or wheezing  rash, fever, and swollen lymph nodes  redness, blistering, peeling or loosening of the skin, including inside the mouth  severe redness and swelling of normal skin  slurred speech or weakness on one  side of the body  trouble passing urine or change in the amount of urine  unexplained weight gain or swelling  unusually weak or tired  yellowing of eyes or skin Side effects that usually do not require medical attention (report to your doctor or health care professional if they continue or are bothersome):  increased sensitivity of the skin to sun and ultraviolet light  pain and burning of the affected area  scaling or swelling of the affected area  skin rash, itching of the affected area  tenderness This list may not describe all possible side effects. Call your doctor for medical advice about side effects. You may report side effects to FDA at 1-800-FDA-1088. Where should I keep my medicine? Keep out of the reach of children and pets. Store at room temperature between 20 and 25 degrees C (68 and 77 degrees F). Throw away any unused medicine after the expiration date. NOTE: This sheet is a summary. It may not cover all possible information. If you have questions about this medicine, talk to your doctor, pharmacist, or health care provider.  2020 Elsevier/Gold Standard (2018-08-14 13:31:57)

## 2019-04-25 ENCOUNTER — Other Ambulatory Visit: Payer: Self-pay

## 2019-04-25 ENCOUNTER — Encounter: Payer: Self-pay | Admitting: Interventional Cardiology

## 2019-04-25 ENCOUNTER — Ambulatory Visit: Payer: Medicare Other | Admitting: Interventional Cardiology

## 2019-04-25 ENCOUNTER — Inpatient Hospital Stay: Payer: Medicare Other

## 2019-04-25 VITALS — BP 124/56 | HR 62 | Temp 98.9°F | Resp 18

## 2019-04-25 VITALS — BP 126/60 | HR 85 | Ht 71.0 in | Wt 238.8 lb

## 2019-04-25 DIAGNOSIS — I6521 Occlusion and stenosis of right carotid artery: Secondary | ICD-10-CM | POA: Diagnosis not present

## 2019-04-25 DIAGNOSIS — Z95828 Presence of other vascular implants and grafts: Secondary | ICD-10-CM

## 2019-04-25 DIAGNOSIS — E782 Mixed hyperlipidemia: Secondary | ICD-10-CM

## 2019-04-25 DIAGNOSIS — Z5111 Encounter for antineoplastic chemotherapy: Secondary | ICD-10-CM | POA: Diagnosis not present

## 2019-04-25 DIAGNOSIS — I2510 Atherosclerotic heart disease of native coronary artery without angina pectoris: Secondary | ICD-10-CM

## 2019-04-25 DIAGNOSIS — I10 Essential (primary) hypertension: Secondary | ICD-10-CM | POA: Diagnosis not present

## 2019-04-25 MED ORDER — HEPARIN SOD (PORK) LOCK FLUSH 100 UNIT/ML IV SOLN
500.0000 [IU] | Freq: Once | INTRAVENOUS | Status: AC | PRN
  Administered 2019-04-25: 500 [IU]
  Filled 2019-04-25: qty 5

## 2019-04-25 MED ORDER — SODIUM CHLORIDE 0.9% FLUSH
10.0000 mL | INTRAVENOUS | Status: DC | PRN
  Administered 2019-04-25: 15:00:00 10 mL
  Filled 2019-04-25: qty 10

## 2019-04-25 NOTE — Patient Instructions (Signed)

## 2019-04-25 NOTE — Patient Instructions (Signed)
Medication Instructions:  Your physician recommends that you continue on your current medications as directed. Please refer to the Current Medication list given to you today.  *If you need a refill on your cardiac medications before your next appointment, please call your pharmacy*  Lab Work: Please have fasting cholesterol checked by your Primary Care Doctor  If you have labs (blood work) drawn today and your tests are completely normal, you will receive your results only by: [name] MyChart Message (if you have MyChart) OR . A paper copy in the mail If you have any lab test that is abnormal or we need to change your treatment, we will call you to review the results.  Testing/Procedures: None ordered  Follow-Up: At [facility], you and your health needs are our priority.  As part of our continuing mission to provide you with exceptional heart care, we have created designated Provider Care Teams.  These Care Teams include your primary Cardiologist (physician) and Advanced Practice Providers (APPs -  Physician Assistants and Nurse Practitioners) who all work together to provide you with the care you need, when you need it.  Your next appointment:   12 months  The format for your next appointment:   In Person  Provider:   You may see Larae Grooms, MD or one of the following Advanced Practice Providers on your designated Care Team:    Melina Copa, PA-C  Ermalinda Barrios, PA-C   Other Instructions

## 2019-05-02 ENCOUNTER — Ambulatory Visit (HOSPITAL_COMMUNITY)
Admission: RE | Admit: 2019-05-02 | Discharge: 2019-05-02 | Disposition: A | Discharge status: Home / Self Care | Payer: Medicare Other | Source: Ambulatory Visit | Attending: Cardiology | Admitting: Cardiology

## 2019-05-02 ENCOUNTER — Other Ambulatory Visit: Payer: Self-pay

## 2019-05-02 DIAGNOSIS — I6523 Occlusion and stenosis of bilateral carotid arteries: Secondary | ICD-10-CM | POA: Diagnosis present

## 2019-05-04 ENCOUNTER — Other Ambulatory Visit: Payer: Self-pay | Admitting: Oncology

## 2019-05-06 ENCOUNTER — Other Ambulatory Visit: Payer: Self-pay

## 2019-05-06 DIAGNOSIS — I6523 Occlusion and stenosis of bilateral carotid arteries: Secondary | ICD-10-CM

## 2019-05-07 ENCOUNTER — Inpatient Hospital Stay (HOSPITAL_BASED_OUTPATIENT_CLINIC_OR_DEPARTMENT_OTHER): Payer: Medicare Other | Admitting: Oncology

## 2019-05-07 ENCOUNTER — Other Ambulatory Visit: Payer: Self-pay

## 2019-05-07 ENCOUNTER — Inpatient Hospital Stay: Payer: Medicare Other

## 2019-05-07 VITALS — BP 106/72 | HR 63 | Temp 98.7°F | Resp 17 | Ht 71.0 in | Wt 231.6 lb

## 2019-05-07 DIAGNOSIS — C180 Malignant neoplasm of cecum: Secondary | ICD-10-CM | POA: Diagnosis not present

## 2019-05-07 DIAGNOSIS — Z5111 Encounter for antineoplastic chemotherapy: Secondary | ICD-10-CM | POA: Diagnosis not present

## 2019-05-07 DIAGNOSIS — Z95828 Presence of other vascular implants and grafts: Secondary | ICD-10-CM

## 2019-05-07 LAB — CBC WITH DIFFERENTIAL (CANCER CENTER ONLY)
Abs Immature Granulocytes: 0.01 10*3/uL (ref 0.00–0.07)
Basophils Absolute: 0 10*3/uL (ref 0.0–0.1)
Basophils Relative: 1 %
Eosinophils Absolute: 0.1 10*3/uL (ref 0.0–0.5)
Eosinophils Relative: 3 %
HCT: 36.5 % — ABNORMAL LOW (ref 39.0–52.0)
Hemoglobin: 12.6 g/dL — ABNORMAL LOW (ref 13.0–17.0)
Immature Granulocytes: 0 %
Lymphocytes Relative: 14 %
Lymphs Abs: 0.5 10*3/uL — ABNORMAL LOW (ref 0.7–4.0)
MCH: 32.1 pg (ref 26.0–34.0)
MCHC: 34.5 g/dL (ref 30.0–36.0)
MCV: 92.9 fL (ref 80.0–100.0)
Monocytes Absolute: 0.6 10*3/uL (ref 0.1–1.0)
Monocytes Relative: 18 %
Neutro Abs: 2.2 10*3/uL (ref 1.7–7.7)
Neutrophils Relative %: 64 %
Platelet Count: 74 10*3/uL — ABNORMAL LOW (ref 150–400)
RBC: 3.93 MIL/uL — ABNORMAL LOW (ref 4.22–5.81)
RDW: 16.5 % — ABNORMAL HIGH (ref 11.5–15.5)
WBC Count: 3.5 10*3/uL — ABNORMAL LOW (ref 4.0–10.5)
nRBC: 0 % (ref 0.0–0.2)

## 2019-05-07 LAB — CMP (CANCER CENTER ONLY)
ALT: 37 U/L (ref 0–44)
AST: 45 U/L — ABNORMAL HIGH (ref 15–41)
Albumin: 3.6 g/dL (ref 3.5–5.0)
Alkaline Phosphatase: 101 U/L (ref 38–126)
Anion gap: 7 (ref 5–15)
BUN: 18 mg/dL (ref 8–23)
CO2: 25 mmol/L (ref 22–32)
Calcium: 8.5 mg/dL — ABNORMAL LOW (ref 8.9–10.3)
Chloride: 105 mmol/L (ref 98–111)
Creatinine: 0.97 mg/dL (ref 0.61–1.24)
GFR, Est AFR Am: >60 mL/min (ref >60)
GFR, Estimated: >60 mL/min (ref >60)
Glucose, Bld: 114 mg/dL — ABNORMAL HIGH (ref 70–99)
Potassium: 4.6 mmol/L (ref 3.5–5.1)
Sodium: 137 mmol/L (ref 135–145)
Total Bilirubin: 0.7 mg/dL (ref 0.3–1.2)
Total Protein: 6.3 g/dL — ABNORMAL LOW (ref 6.5–8.1)

## 2019-05-07 LAB — CEA (IN HOUSE-CHCC): CEA (CHCC-In House): 3.34 ng/mL (ref 0.00–5.00)

## 2019-05-07 MED ORDER — DEXTROSE 5 % IV SOLN
Freq: Once | INTRAVENOUS | Status: AC
  Administered 2019-05-07: 13:00:00 via INTRAVENOUS
  Filled 2019-05-07: qty 250

## 2019-05-07 MED ORDER — PALONOSETRON HCL INJECTION 0.25 MG/5ML
INTRAVENOUS | Status: AC
  Filled 2019-05-07: qty 5

## 2019-05-07 MED ORDER — SODIUM CHLORIDE 0.9 % IV SOLN
2400.0000 mg/m2 | INTRAVENOUS | Status: DC
  Administered 2019-05-07: 5450 mg via INTRAVENOUS
  Filled 2019-05-07: qty 109

## 2019-05-07 MED ORDER — PALONOSETRON HCL INJECTION 0.25 MG/5ML
0.2500 mg | Freq: Once | INTRAVENOUS | Status: AC
  Administered 2019-05-07: 0.25 mg via INTRAVENOUS

## 2019-05-07 MED ORDER — SODIUM CHLORIDE 0.9% FLUSH
10.0000 mL | INTRAVENOUS | Status: DC | PRN
  Administered 2019-05-07: 10 mL
  Filled 2019-05-07: qty 10

## 2019-05-07 MED ORDER — DEXAMETHASONE SODIUM PHOSPHATE 10 MG/ML IJ SOLN
INTRAMUSCULAR | Status: AC
  Filled 2019-05-07: qty 1

## 2019-05-07 MED ORDER — DEXAMETHASONE SODIUM PHOSPHATE 10 MG/ML IJ SOLN
10.0000 mg | Freq: Once | INTRAMUSCULAR | Status: AC
  Administered 2019-05-07: 10 mg via INTRAVENOUS

## 2019-05-07 MED ORDER — OXALIPLATIN CHEMO INJECTION 100 MG/20ML
55.0000 mg/m2 | Freq: Once | INTRAVENOUS | Status: AC
  Administered 2019-05-07: 125 mg via INTRAVENOUS
  Filled 2019-05-07: qty 20

## 2019-05-07 MED ORDER — FLUOROURACIL CHEMO INJECTION 2.5 GM/50ML
400.0000 mg/m2 | Freq: Once | INTRAVENOUS | Status: AC
  Administered 2019-05-07: 900 mg via INTRAVENOUS
  Filled 2019-05-07: qty 18

## 2019-05-07 MED ORDER — LEUCOVORIN CALCIUM INJECTION 350 MG
400.0000 mg/m2 | Freq: Once | INTRAVENOUS | Status: AC
  Administered 2019-05-07: 912 mg via INTRAVENOUS
  Filled 2019-05-07: qty 45.6

## 2019-05-07 NOTE — Patient Instructions (Signed)
[facility] Discharge Instructions for Patients Receiving Chemotherapy  Today you received the following chemotherapy agents Oxaliplatin, Leucovorin and Adrucil   To help prevent nausea and vomiting after your treatment, we encourage you to take your nausea medication as directed.    If you develop nausea and vomiting that is not controlled by your nausea medication, call the clinic.   BELOW ARE SYMPTOMS THAT SHOULD BE REPORTED IMMEDIATELY:  *FEVER GREATER THAN 100.5 F  *CHILLS WITH OR WITHOUT FEVER  NAUSEA AND VOMITING THAT IS NOT CONTROLLED WITH YOUR NAUSEA MEDICATION  *UNUSUAL SHORTNESS OF BREATH  *UNUSUAL BRUISING OR BLEEDING  TENDERNESS IN MOUTH AND THROAT WITH OR WITHOUT PRESENCE OF ULCERS  *URINARY PROBLEMS  *BOWEL PROBLEMS  UNUSUAL RASH Items with * indicate a potential emergency and should be followed up as soon as possible.  Feel free to call the clinic should you have any questions or concerns. The clinic phone number is [phone].  Please show the CHEMO ALERT CARD at check-in to the Emergency Department and triage nurse.   

## 2019-05-07 NOTE — Progress Notes (Signed)
Per Dr. Benay Spice: OK to treat w/platelets 74,000

## 2019-05-07 NOTE — Patient Instructions (Signed)

## 2019-05-07 NOTE — Progress Notes (Signed)
  [facility] OFFICE PROGRESS NOTE   Diagnosis: Colon cancer  INTERVAL HISTORY:   Luis [name] completed another cycle of FOLFOX on 04/23/2019.  No nausea/vomiting or mouth sores.  He reports loose stool following chemotherapy, up to daily.  He had burning with bowel movements.  He had cold sensitivity following chemotherapy lasting 3-4 days.  This has resolved.  No neuropathy symptoms at present.  He would like to have a "cortisone injection "for chronic hip pain.  No bleeding.  Objective:  Vital signs in last 24 hours:  Blood pressure 106/72, pulse 63, temperature 98.7 F (37.1 C), temperature source Temporal, resp. rate 17, height '5\' 11"'$  (1.803 m), weight 231 lb 9.6 oz (105.1 kg), SpO2 98 %.   Limited physical examination secondary to distancing with the Covid pandemic GI: No hepatomegaly, nontender Vascular: No leg edema Neuro: The vibratory sense is intact at the fingertips bilaterally Skin: Palms without erythema  Portacath/PICC-without erythema  Lab Results:  Lab Results  Component Value Date   WBC 3.5 (L) 05/07/2019   HGB 12.6 (L) 05/07/2019   HCT 36.5 (L) 05/07/2019   MCV 92.9 05/07/2019   PLT 74 (L) 05/07/2019   NEUTROABS 2.2 05/07/2019    CMP  Lab Results  Component Value Date   NA 137 05/07/2019   K 4.6 05/07/2019   CL 105 05/07/2019   CO2 25 05/07/2019   GLUCOSE 114 (H) 05/07/2019   BUN 18 05/07/2019   CREATININE 0.97 05/07/2019   CALCIUM 8.5 (L) 05/07/2019   PROT 6.3 (L) 05/07/2019   ALBUMIN 3.6 05/07/2019   AST 45 (H) 05/07/2019   ALT 37 05/07/2019   ALKPHOS 101 05/07/2019   BILITOT 0.7 05/07/2019   GFRNONAA >60 05/07/2019   GFRAA >60 05/07/2019    Lab Results  Component Value Date   CEA1 6.69 (H) 04/09/2019     Medications: I have reviewed the patient's current medications.   Assessment/Plan: 1. Adenocarcinoma of the cecum, stage IIA(T3N0), status post a right colectomy 07/26/2017 ? 0/16 lymph nodes positive, no  lymphovascular invasion, perineural invasion present ? MSI-stable, no loss of mismatch repair protein expression ? Surveillance colonoscopy 07/26/2018-no polyps found ? Elevated CEA June 2020, persistently elevated August 2020 ? CTs 02/07/2019- bilateral liver metastases, no extrahepatic metastatic disease, changes of sarcoidosis in the chest ? Biopsy liver lesion 03/04/2019-metastatic adenocarcinoma, with morphology consistent with metastatic colonic adenocarcinoma ? Cycle 1 FOLFOX 03/12/2019 ? Cycle 2 FOLFOX 03/26/2019, oxaliplatin dose reduced secondary to thrombocytopenia ? Cycle 3 FOLFOX 04/09/2019, oxaliplatin further dose reduced and aspirin held secondary to thrombocytopenia ? Cycle 4 FOLFOX 04/23/2019 ? Cycle 5 FOLFOX 05/07/2019 2. History of colon polyps 3. Microcytic anemia January 2019 4. Sarcoidosis 5. Coronary artery disease 6. Port-A-Cath placement, interventional radiology, 03/04/2019 7. Thrombocytopenia, mild thrombocytopenia predating chemotherapy    Disposition: Luis [name] appears stable.  He has completed 4 cycles of FOLFOX.  He will complete cycle 5 today.  He has stable thrombocytopenia.  He will call for bleeding.  He remains off of aspirin. Luis [name] will undergo a restaging CT evaluation after this cycle.  He will return for an office visit on 05/27/2019.  Betsy Coder, MD  05/07/2019  12:20 PM

## 2019-05-09 ENCOUNTER — Inpatient Hospital Stay: Payer: Medicare Other

## 2019-05-09 ENCOUNTER — Other Ambulatory Visit: Payer: Self-pay

## 2019-05-09 VITALS — BP 138/63 | HR 61 | Temp 98.7°F | Resp 18

## 2019-05-09 DIAGNOSIS — Z5111 Encounter for antineoplastic chemotherapy: Secondary | ICD-10-CM | POA: Diagnosis not present

## 2019-05-09 DIAGNOSIS — C180 Malignant neoplasm of cecum: Secondary | ICD-10-CM

## 2019-05-09 MED ORDER — SODIUM CHLORIDE 0.9% FLUSH
10.0000 mL | INTRAVENOUS | Status: DC | PRN
  Administered 2019-05-09: 10 mL
  Filled 2019-05-09: qty 10

## 2019-05-09 MED ORDER — HEPARIN SOD (PORK) LOCK FLUSH 100 UNIT/ML IV SOLN
500.0000 [IU] | Freq: Once | INTRAVENOUS | Status: AC | PRN
  Administered 2019-05-09: 14:00:00 500 [IU]
  Filled 2019-05-09: qty 5

## 2019-05-23 ENCOUNTER — Ambulatory Visit (HOSPITAL_COMMUNITY)
Admission: RE | Admit: 2019-05-23 | Discharge: 2019-05-23 | Disposition: A | Discharge status: Home / Self Care | Payer: Medicare Other | Source: Ambulatory Visit | Attending: Oncology | Admitting: Oncology

## 2019-05-23 ENCOUNTER — Other Ambulatory Visit: Payer: Self-pay

## 2019-05-23 ENCOUNTER — Telehealth: Payer: Self-pay | Admitting: *Deleted

## 2019-05-23 ENCOUNTER — Encounter (HOSPITAL_COMMUNITY): Payer: Self-pay

## 2019-05-23 DIAGNOSIS — C180 Malignant neoplasm of cecum: Secondary | ICD-10-CM | POA: Diagnosis not present

## 2019-05-23 MED ORDER — SODIUM CHLORIDE (PF) 0.9 % IJ SOLN
INTRAMUSCULAR | Status: AC
  Filled 2019-05-23: qty 50

## 2019-05-23 MED ORDER — IOHEXOL 300 MG/ML  SOLN
100.0000 mL | Freq: Once | INTRAMUSCULAR | Status: AC | PRN
  Administered 2019-05-23: 100 mL via INTRAVENOUS

## 2019-05-23 NOTE — Telephone Encounter (Signed)
-----   Message from Ladell Pier, MD sent at 05/23/2019  3:03 PM EST ----- Please call patient, CT shows improvement in liver metastases and no new finding, follow-up as scheduled

## 2019-05-23 NOTE — Telephone Encounter (Signed)
Notified of CT showing improvement in liver metastasis with no new cancer observed. F/U as scheduled.

## 2019-05-25 ENCOUNTER — Other Ambulatory Visit: Payer: Self-pay | Admitting: Oncology

## 2019-05-27 ENCOUNTER — Encounter: Payer: Self-pay | Admitting: Nurse Practitioner

## 2019-05-27 ENCOUNTER — Inpatient Hospital Stay: Payer: Medicare Other

## 2019-05-27 ENCOUNTER — Inpatient Hospital Stay: Payer: Medicare Other | Attending: Nurse Practitioner | Admitting: Nurse Practitioner

## 2019-05-27 ENCOUNTER — Other Ambulatory Visit: Payer: Self-pay

## 2019-05-27 VITALS — BP 131/57 | HR 66 | Temp 98.3°F | Resp 18

## 2019-05-27 VITALS — BP 131/57 | HR 97 | Temp 98.3°F | Resp 17 | Wt 232.0 lb

## 2019-05-27 DIAGNOSIS — C180 Malignant neoplasm of cecum: Secondary | ICD-10-CM | POA: Insufficient documentation

## 2019-05-27 DIAGNOSIS — C787 Secondary malignant neoplasm of liver and intrahepatic bile duct: Secondary | ICD-10-CM | POA: Diagnosis not present

## 2019-05-27 DIAGNOSIS — Z95828 Presence of other vascular implants and grafts: Secondary | ICD-10-CM

## 2019-05-27 DIAGNOSIS — Z5189 Encounter for other specified aftercare: Secondary | ICD-10-CM | POA: Diagnosis not present

## 2019-05-27 DIAGNOSIS — Z5111 Encounter for antineoplastic chemotherapy: Secondary | ICD-10-CM | POA: Insufficient documentation

## 2019-05-27 LAB — CMP (CANCER CENTER ONLY)
ALT: 41 U/L (ref 0–44)
AST: 51 U/L — ABNORMAL HIGH (ref 15–41)
Albumin: 3.7 g/dL (ref 3.5–5.0)
Alkaline Phosphatase: 116 U/L (ref 38–126)
Anion gap: 8 (ref 5–15)
BUN: 16 mg/dL (ref 8–23)
CO2: 27 mmol/L (ref 22–32)
Calcium: 8.5 mg/dL — ABNORMAL LOW (ref 8.9–10.3)
Chloride: 105 mmol/L (ref 98–111)
Creatinine: 0.84 mg/dL (ref 0.61–1.24)
GFR, Est AFR Am: >60 mL/min (ref >60)
GFR, Estimated: >60 mL/min (ref >60)
Glucose, Bld: 104 mg/dL — ABNORMAL HIGH (ref 70–99)
Potassium: 4.5 mmol/L (ref 3.5–5.1)
Sodium: 140 mmol/L (ref 135–145)
Total Bilirubin: 0.4 mg/dL (ref 0.3–1.2)
Total Protein: 6.2 g/dL — ABNORMAL LOW (ref 6.5–8.1)

## 2019-05-27 LAB — CBC WITH DIFFERENTIAL (CANCER CENTER ONLY)
Abs Immature Granulocytes: 0.01 10*3/uL (ref 0.00–0.07)
Basophils Absolute: 0 10*3/uL (ref 0.0–0.1)
Basophils Relative: 1 %
Eosinophils Absolute: 0.1 10*3/uL (ref 0.0–0.5)
Eosinophils Relative: 4 %
HCT: 36.5 % — ABNORMAL LOW (ref 39.0–52.0)
Hemoglobin: 12.5 g/dL — ABNORMAL LOW (ref 13.0–17.0)
Immature Granulocytes: 0 %
Lymphocytes Relative: 14 %
Lymphs Abs: 0.4 10*3/uL — ABNORMAL LOW (ref 0.7–4.0)
MCH: 32.7 pg (ref 26.0–34.0)
MCHC: 34.2 g/dL (ref 30.0–36.0)
MCV: 95.5 fL (ref 80.0–100.0)
Monocytes Absolute: 0.7 10*3/uL (ref 0.1–1.0)
Monocytes Relative: 26 %
Neutro Abs: 1.5 10*3/uL — ABNORMAL LOW (ref 1.7–7.7)
Neutrophils Relative %: 55 %
Platelet Count: 116 10*3/uL — ABNORMAL LOW (ref 150–400)
RBC: 3.82 MIL/uL — ABNORMAL LOW (ref 4.22–5.81)
RDW: 17.2 % — ABNORMAL HIGH (ref 11.5–15.5)
WBC Count: 2.7 10*3/uL — ABNORMAL LOW (ref 4.0–10.5)
nRBC: 0 % (ref 0.0–0.2)

## 2019-05-27 LAB — CEA (IN HOUSE-CHCC): CEA (CHCC-In House): 3.3 ng/mL (ref 0.00–5.00)

## 2019-05-27 MED ORDER — SODIUM CHLORIDE 0.9% FLUSH
10.0000 mL | INTRAVENOUS | Status: DC | PRN
  Filled 2019-05-27: qty 10

## 2019-05-27 MED ORDER — SODIUM CHLORIDE 0.9 % IV SOLN
2400.0000 mg/m2 | INTRAVENOUS | Status: DC
  Administered 2019-05-27: 5450 mg via INTRAVENOUS
  Filled 2019-05-27: qty 109

## 2019-05-27 MED ORDER — DEXAMETHASONE SODIUM PHOSPHATE 10 MG/ML IJ SOLN
10.0000 mg | Freq: Once | INTRAMUSCULAR | Status: AC
  Administered 2019-05-27: 10 mg via INTRAVENOUS

## 2019-05-27 MED ORDER — PALONOSETRON HCL INJECTION 0.25 MG/5ML
INTRAVENOUS | Status: AC
  Filled 2019-05-27: qty 5

## 2019-05-27 MED ORDER — LEUCOVORIN CALCIUM INJECTION 350 MG
400.0000 mg/m2 | Freq: Once | INTRAVENOUS | Status: AC
  Administered 2019-05-27: 912 mg via INTRAVENOUS
  Filled 2019-05-27: qty 45.6

## 2019-05-27 MED ORDER — HEPARIN SOD (PORK) LOCK FLUSH 100 UNIT/ML IV SOLN
500.0000 [IU] | Freq: Once | INTRAVENOUS | Status: DC | PRN
  Filled 2019-05-27: qty 5

## 2019-05-27 MED ORDER — SODIUM CHLORIDE 0.9% FLUSH
10.0000 mL | INTRAVENOUS | Status: DC | PRN
  Administered 2019-05-27: 10 mL
  Filled 2019-05-27: qty 10

## 2019-05-27 MED ORDER — DEXTROSE 5 % IV SOLN
Freq: Once | INTRAVENOUS | Status: AC
  Filled 2019-05-27: qty 250

## 2019-05-27 MED ORDER — PALONOSETRON HCL INJECTION 0.25 MG/5ML
0.2500 mg | Freq: Once | INTRAVENOUS | Status: AC
  Administered 2019-05-27: 0.25 mg via INTRAVENOUS

## 2019-05-27 MED ORDER — FLUOROURACIL CHEMO INJECTION 2.5 GM/50ML
400.0000 mg/m2 | Freq: Once | INTRAVENOUS | Status: AC
  Administered 2019-05-27: 900 mg via INTRAVENOUS
  Filled 2019-05-27: qty 18

## 2019-05-27 MED ORDER — OXALIPLATIN CHEMO INJECTION 100 MG/20ML
55.0000 mg/m2 | Freq: Once | INTRAVENOUS | Status: AC
  Administered 2019-05-27: 125 mg via INTRAVENOUS
  Filled 2019-05-27: qty 20

## 2019-05-27 MED ORDER — DEXAMETHASONE SODIUM PHOSPHATE 10 MG/ML IJ SOLN
INTRAMUSCULAR | Status: AC
  Filled 2019-05-27: qty 1

## 2019-05-27 NOTE — Progress Notes (Addendum)
[place] OFFICE PROGRESS NOTE   Diagnosis: Colon cancer  INTERVAL HISTORY:   Mr. Daigler returns as scheduled.  He completed cycle 5 FOLFOX 05/07/2019.  He denies nausea/vomiting.  He had a mouth sore a few weeks ago which has resolved.  No diarrhea.  Cold sensitivity typically lasts 3 or 4 days.  He has occasional intermittent numbness and tingling in the hands and feet unrelated to cold exposure.  Symptoms do not interfere with activity.  Objective:  Vital signs in last 24 hours:  Blood pressure (!) 131/57, pulse 97, temperature 98.3 F (36.8 C), resp. rate 17, weight 232 lb (105.2 kg).    HEENT: No thrush or ulcers. GI: Abdomen soft and nontender.  No hepatomegaly.  No apparent ascites. Vascular: No leg edema. Neuro: Vibratory sense mildly decreased over the fingertips per tuning fork exam. Skin: Palms without erythema. Port-A-Cath without erythema.   Lab Results:  Lab Results  Component Value Date   WBC 2.7 (L) 05/27/2019   HGB 12.5 (L) 05/27/2019   HCT 36.5 (L) 05/27/2019   MCV 95.5 05/27/2019   PLT 116 (L) 05/27/2019   NEUTROABS 1.5 (L) 05/27/2019    Imaging:  No results found.  Medications: I have reviewed the patient's current medications.  Assessment/Plan: 1. Adenocarcinoma of the cecum, stage IIA(T3N0), status post a right colectomy 07/26/2017 ? 0/16 lymph nodes positive, no lymphovascular invasion, perineural invasion present ? MSI-stable, no loss of mismatch repair protein expression ? Surveillance colonoscopy 07/26/2018-no polyps found ? Elevated CEA June 2020, persistently elevated August 2020 ? CTs 02/07/2019-bilateral liver metastases, no extrahepatic metastatic disease, changes of sarcoidosis in the chest ? Biopsy liver lesion 03/04/2019-metastatic adenocarcinoma, with morphology consistent with metastatic colonic adenocarcinoma ? Cycle 1 FOLFOX 03/12/2019 ? Cycle 2 FOLFOX 03/26/2019, oxaliplatin dose reduced secondary to  thrombocytopenia ? Cycle 3 FOLFOX 04/09/2019, oxaliplatin further dose reduced and aspirin held secondary to thrombocytopenia ? Cycle 4 FOLFOX 04/23/2019 ? Cycle 5 FOLFOX 05/07/2019 ? CTs 05/23/2019-decrease in liver lesions.  No new or progressive findings. 2. History of colon polyps 3. Microcytic anemia January 2019 4. Sarcoidosis 5. Coronary artery disease 6. Port-A-Cath placement, interventional radiology, 03/04/2019 7. Thrombocytopenia, mild thrombocytopenia predating chemotherapy   Disposition: Mr. Mitzel appears stable.  He has completed 5 cycles of FOLFOX.  Restaging CTs show improvement in the liver lesions and no new disease.  Plan to proceed with cycle 6 FOLFOX today as scheduled.  His case will be presented at the GI tumor conference to see if he is a candidate for ablation of the liver lesions.  We reviewed the CBC from today.  He has mild neutropenia.  He will receive white cell growth factor support on the day of pump discontinuation.  We reviewed potential toxicities including bone pain, rash, splenic rupture.  He agrees to proceed.  We reviewed neutropenic precautions.  He understands to contact the office with fever, chills, other signs of infection.  He will return for lab, follow-up, FOLFOX in 2 weeks.  He will contact the office in the interim as outlined above or with any other problems.    Patient seen with Dr. Benay Spice.  CT images reviewed on the computer with Mr. Wissing.  25 minutes were spent face-to-face at today's visit with the majority of that time involved in counseling/coordination of care.    Ned Card ANP/GNP-BC   05/27/2019  10:04 AM This was a shared visit with Ned Card.  We reviewed the restaging CT findings and images Mr. Menees.  The liver lesions are smaller.  He will complete another cycle of FOLFOX today.  I doubt he will be a candidate for liver resection.  I will present his case at the GI tumor conference to discuss the possibility of  ablation and SBRT.  Julieanne Manson, MD

## 2019-05-27 NOTE — Patient Instructions (Signed)
[facility] Discharge Instructions for Patients Receiving Chemotherapy  Today you received the following chemotherapy agents: Oxaliplatin/Leucovorin/5FU  To help prevent nausea and vomiting after your treatment, we encourage you to take your nausea medication as directed.   If you develop nausea and vomiting that is not controlled by your nausea medication, call the clinic.   BELOW ARE SYMPTOMS THAT SHOULD BE REPORTED IMMEDIATELY:  *FEVER GREATER THAN 100.5 F  *CHILLS WITH OR WITHOUT FEVER  NAUSEA AND VOMITING THAT IS NOT CONTROLLED WITH YOUR NAUSEA MEDICATION  *UNUSUAL SHORTNESS OF BREATH  *UNUSUAL BRUISING OR BLEEDING  TENDERNESS IN MOUTH AND THROAT WITH OR WITHOUT PRESENCE OF ULCERS  *URINARY PROBLEMS  *BOWEL PROBLEMS  UNUSUAL RASH Items with * indicate a potential emergency and should be followed up as soon as possible.  Feel free to call the clinic should you have any questions or concerns. The clinic phone number is [phone].  Please show the CHEMO ALERT CARD at check-in to the Emergency Department and triage nurse.   

## 2019-05-29 ENCOUNTER — Other Ambulatory Visit: Payer: Self-pay

## 2019-05-29 ENCOUNTER — Inpatient Hospital Stay: Payer: Medicare Other

## 2019-05-29 VITALS — BP 134/65 | HR 64 | Temp 98.1°F | Resp 20

## 2019-05-29 DIAGNOSIS — C180 Malignant neoplasm of cecum: Secondary | ICD-10-CM

## 2019-05-29 DIAGNOSIS — Z5111 Encounter for antineoplastic chemotherapy: Secondary | ICD-10-CM | POA: Diagnosis not present

## 2019-05-29 MED ORDER — PEGFILGRASTIM-CBQV 6 MG/0.6ML ~~LOC~~ SOSY
PREFILLED_SYRINGE | SUBCUTANEOUS | Status: AC
  Filled 2019-05-29: qty 0.6

## 2019-05-29 MED ORDER — SODIUM CHLORIDE 0.9% FLUSH
10.0000 mL | INTRAVENOUS | Status: DC | PRN
  Administered 2019-05-29: 10 mL
  Filled 2019-05-29: qty 10

## 2019-05-29 MED ORDER — HEPARIN SOD (PORK) LOCK FLUSH 100 UNIT/ML IV SOLN
500.0000 [IU] | Freq: Once | INTRAVENOUS | Status: AC | PRN
  Administered 2019-05-29: 500 [IU]
  Filled 2019-05-29: qty 5

## 2019-05-29 MED ORDER — PEGFILGRASTIM-CBQV 6 MG/0.6ML ~~LOC~~ SOSY
6.0000 mg | PREFILLED_SYRINGE | Freq: Once | SUBCUTANEOUS | Status: AC
  Administered 2019-05-29: 6 mg via SUBCUTANEOUS

## 2019-05-29 NOTE — Patient Instructions (Signed)

## 2019-06-03 ENCOUNTER — Telehealth: Payer: Self-pay | Admitting: Oncology

## 2019-06-03 NOTE — Telephone Encounter (Signed)
Scheduled per los. Called and spoke with patient. Confirmed appt 

## 2019-06-08 ENCOUNTER — Other Ambulatory Visit: Payer: Self-pay | Admitting: Oncology

## 2019-06-10 ENCOUNTER — Encounter: Payer: Self-pay | Admitting: Nurse Practitioner

## 2019-06-10 ENCOUNTER — Other Ambulatory Visit: Payer: Self-pay

## 2019-06-10 ENCOUNTER — Inpatient Hospital Stay: Payer: Medicare Other

## 2019-06-10 ENCOUNTER — Inpatient Hospital Stay (HOSPITAL_BASED_OUTPATIENT_CLINIC_OR_DEPARTMENT_OTHER): Payer: Medicare Other | Admitting: Nurse Practitioner

## 2019-06-10 VITALS — BP 140/66 | HR 66 | Temp 98.7°F | Resp 18 | Ht 71.0 in | Wt 234.1 lb

## 2019-06-10 DIAGNOSIS — Z95828 Presence of other vascular implants and grafts: Secondary | ICD-10-CM

## 2019-06-10 DIAGNOSIS — C180 Malignant neoplasm of cecum: Secondary | ICD-10-CM | POA: Diagnosis not present

## 2019-06-10 DIAGNOSIS — Z5111 Encounter for antineoplastic chemotherapy: Secondary | ICD-10-CM | POA: Diagnosis not present

## 2019-06-10 LAB — CMP (CANCER CENTER ONLY)
ALT: 33 U/L (ref 0–44)
AST: 42 U/L — ABNORMAL HIGH (ref 15–41)
Albumin: 3.8 g/dL (ref 3.5–5.0)
Alkaline Phosphatase: 120 U/L (ref 38–126)
Anion gap: 10 (ref 5–15)
BUN: 16 mg/dL (ref 8–23)
CO2: 26 mmol/L (ref 22–32)
Calcium: 8.6 mg/dL — ABNORMAL LOW (ref 8.9–10.3)
Chloride: 103 mmol/L (ref 98–111)
Creatinine: 0.88 mg/dL (ref 0.61–1.24)
GFR, Est AFR Am: >60 mL/min (ref >60)
GFR, Estimated: >60 mL/min (ref >60)
Glucose, Bld: 137 mg/dL — ABNORMAL HIGH (ref 70–99)
Potassium: 4.2 mmol/L (ref 3.5–5.1)
Sodium: 139 mmol/L (ref 135–145)
Total Bilirubin: 0.5 mg/dL (ref 0.3–1.2)
Total Protein: 6.3 g/dL — ABNORMAL LOW (ref 6.5–8.1)

## 2019-06-10 LAB — CBC WITH DIFFERENTIAL (CANCER CENTER ONLY)
Abs Immature Granulocytes: 0.12 10*3/uL — ABNORMAL HIGH (ref 0.00–0.07)
Basophils Absolute: 0 10*3/uL (ref 0.0–0.1)
Basophils Relative: 1 %
Eosinophils Absolute: 0.2 10*3/uL (ref 0.0–0.5)
Eosinophils Relative: 2 %
HCT: 37.4 % — ABNORMAL LOW (ref 39.0–52.0)
Hemoglobin: 12.6 g/dL — ABNORMAL LOW (ref 13.0–17.0)
Immature Granulocytes: 2 %
Lymphocytes Relative: 7 %
Lymphs Abs: 0.5 10*3/uL — ABNORMAL LOW (ref 0.7–4.0)
MCH: 32.9 pg (ref 26.0–34.0)
MCHC: 33.7 g/dL (ref 30.0–36.0)
MCV: 97.7 fL (ref 80.0–100.0)
Monocytes Absolute: 0.6 10*3/uL (ref 0.1–1.0)
Monocytes Relative: 9 %
Neutro Abs: 5.1 10*3/uL (ref 1.7–7.7)
Neutrophils Relative %: 79 %
Platelet Count: 94 10*3/uL — ABNORMAL LOW (ref 150–400)
RBC: 3.83 MIL/uL — ABNORMAL LOW (ref 4.22–5.81)
RDW: 17.5 % — ABNORMAL HIGH (ref 11.5–15.5)
WBC Count: 6.4 10*3/uL (ref 4.0–10.5)
nRBC: 0 % (ref 0.0–0.2)

## 2019-06-10 MED ORDER — SODIUM CHLORIDE 0.9% FLUSH
10.0000 mL | INTRAVENOUS | Status: DC | PRN
  Administered 2019-06-10: 10 mL
  Filled 2019-06-10: qty 10

## 2019-06-10 NOTE — Progress Notes (Signed)
  [place] OFFICE PROGRESS NOTE   Diagnosis: Colon cancer  INTERVAL HISTORY:   [name] returns as scheduled.  He completed cycle 6 FOLFOX 05/27/2019.  He denies nausea/vomiting.  No mouth sores.  No diarrhea.  He estimates cold sensitivity lasted 3 or 4 days.  No persistent neuropathy symptoms.  He noted "muscle soreness" after the Udenyca injection.  Objective:  Vital signs in last 24 hours:  Blood pressure 140/66, pulse 66, temperature 98.7 F (37.1 C), temperature source Temporal, resp. rate 18, height '5\' 11"'$  (1.803 m), weight 234 lb 1.6 oz (106.2 kg), SpO2 97 %.    HEENT: No thrush or ulcers. GI: Abdomen soft and nontender.  No hepatomegaly. Vascular: No leg edema. Neuro: Vibratory sense intact over the fingertips per tuning fork exam. Skin: Palms with mild dryness, no erythema. Port-A-Cath without erythema.   Lab Results:  Lab Results  Component Value Date   WBC 6.4 06/10/2019   HGB 12.6 (L) 06/10/2019   HCT 37.4 (L) 06/10/2019   MCV 97.7 06/10/2019   PLT 94 (L) 06/10/2019   NEUTROABS 5.1 06/10/2019    Imaging:  No results found.  Medications: I have reviewed the patient's current medications.  Assessment/Plan: 1. Adenocarcinoma of the cecum, stage IIA(T3N0), status post a right colectomy 07/26/2017 ? 0/16 lymph nodes positive, no lymphovascular invasion, perineural invasion present ? MSI-stable, no loss of mismatch repair protein expression ? Surveillance colonoscopy 07/26/2018-no polyps found ? Elevated CEA June 2020, persistently elevated August 2020 ? CTs 02/07/2019-bilateral liver metastases, no extrahepatic metastatic disease, changes of sarcoidosis in the chest ? Biopsy liver lesion 03/04/2019-metastatic adenocarcinoma, with morphology consistent with metastatic colonic adenocarcinoma ? Cycle 1 FOLFOX 03/12/2019 ? Cycle 2 FOLFOX 03/26/2019, oxaliplatin dose reduced secondary to thrombocytopenia ? Cycle 3 FOLFOX 04/09/2019, oxaliplatin  further dose reduced and aspirin held secondary to thrombocytopenia ? Cycle 4 FOLFOX 04/23/2019 ? Cycle 5 FOLFOX 05/07/2019 ? CTs 05/23/2019-decrease in liver lesions.  No new or progressive findings. ? Cycle 6 FOLFOX 05/27/2019 ? Cycle 7 FOLFOX 06/14/2019 2. History of colon polyps 3. Microcytic anemia January 2019 4. Sarcoidosis 5. Coronary artery disease 6. Port-A-Cath placement, interventional radiology, 03/04/2019 7. Thrombocytopenia, mild thrombocytopenia predating chemotherapy  Disposition: [name] appears stable.  He has completed 6 cycles of FOLFOX.  Plan to proceed with cycle 7 as scheduled on 06/14/2019.  We reviewed the CBC from today.  Counts adequate to proceed with treatment.  He has stable mild thrombocytopenia.  He will return for lab, follow-up, cycle 8 FOLFOX on 07/02/2019.  He will contact the office in the interim with any problems.  Plan reviewed with Dr. Benay Spice.    Luis [name] ANP/GNP-BC   06/10/2019  10:53 AM

## 2019-06-12 ENCOUNTER — Telehealth: Payer: Self-pay | Admitting: Oncology

## 2019-06-12 NOTE — Telephone Encounter (Signed)
Scheduled per los. Called and left msg. Mailed printout  °

## 2019-06-14 ENCOUNTER — Other Ambulatory Visit: Payer: Self-pay

## 2019-06-14 ENCOUNTER — Inpatient Hospital Stay: Payer: Medicare PPO | Attending: Nurse Practitioner

## 2019-06-14 VITALS — BP 129/63 | HR 60 | Temp 99.0°F | Resp 18

## 2019-06-14 DIAGNOSIS — Z5111 Encounter for antineoplastic chemotherapy: Secondary | ICD-10-CM | POA: Insufficient documentation

## 2019-06-14 DIAGNOSIS — C180 Malignant neoplasm of cecum: Secondary | ICD-10-CM | POA: Diagnosis present

## 2019-06-14 DIAGNOSIS — Z5189 Encounter for other specified aftercare: Secondary | ICD-10-CM | POA: Diagnosis not present

## 2019-06-14 MED ORDER — LEUCOVORIN CALCIUM INJECTION 350 MG
400.0000 mg/m2 | Freq: Once | INTRAVENOUS | Status: AC
  Administered 2019-06-14: 10:00:00 912 mg via INTRAVENOUS
  Filled 2019-06-14: qty 45.6

## 2019-06-14 MED ORDER — DEXTROSE 5 % IV SOLN
Freq: Once | INTRAVENOUS | Status: AC
  Filled 2019-06-14: qty 250

## 2019-06-14 MED ORDER — OXALIPLATIN CHEMO INJECTION 100 MG/20ML
55.0000 mg/m2 | Freq: Once | INTRAVENOUS | Status: AC
  Administered 2019-06-14: 10:00:00 125 mg via INTRAVENOUS
  Filled 2019-06-14: qty 20

## 2019-06-14 MED ORDER — FLUOROURACIL CHEMO INJECTION 2.5 GM/50ML
400.0000 mg/m2 | Freq: Once | INTRAVENOUS | Status: AC
  Administered 2019-06-14: 12:00:00 900 mg via INTRAVENOUS
  Filled 2019-06-14: qty 18

## 2019-06-14 MED ORDER — DEXAMETHASONE SODIUM PHOSPHATE 10 MG/ML IJ SOLN
10.0000 mg | Freq: Once | INTRAMUSCULAR | Status: AC
  Administered 2019-06-14: 09:00:00 10 mg via INTRAVENOUS

## 2019-06-14 MED ORDER — DEXAMETHASONE SODIUM PHOSPHATE 10 MG/ML IJ SOLN
INTRAMUSCULAR | Status: AC
  Filled 2019-06-14: qty 1

## 2019-06-14 MED ORDER — PALONOSETRON HCL INJECTION 0.25 MG/5ML
INTRAVENOUS | Status: AC
  Filled 2019-06-14: qty 5

## 2019-06-14 MED ORDER — PALONOSETRON HCL INJECTION 0.25 MG/5ML
0.2500 mg | Freq: Once | INTRAVENOUS | Status: AC
  Administered 2019-06-14: 0.25 mg via INTRAVENOUS

## 2019-06-14 MED ORDER — SODIUM CHLORIDE 0.9 % IV SOLN
2400.0000 mg/m2 | INTRAVENOUS | Status: DC
  Administered 2019-06-14: 12:00:00 5450 mg via INTRAVENOUS
  Filled 2019-06-14: qty 109

## 2019-06-14 MED ORDER — SODIUM CHLORIDE 0.9% FLUSH
10.0000 mL | INTRAVENOUS | Status: DC | PRN
  Filled 2019-06-14: qty 10

## 2019-06-16 ENCOUNTER — Other Ambulatory Visit: Payer: Self-pay

## 2019-06-16 ENCOUNTER — Inpatient Hospital Stay: Payer: Medicare PPO

## 2019-06-16 VITALS — BP 128/62 | HR 65 | Temp 98.2°F | Resp 18

## 2019-06-16 DIAGNOSIS — C180 Malignant neoplasm of cecum: Secondary | ICD-10-CM

## 2019-06-16 DIAGNOSIS — Z5111 Encounter for antineoplastic chemotherapy: Secondary | ICD-10-CM | POA: Diagnosis not present

## 2019-06-16 MED ORDER — PEGFILGRASTIM-CBQV 6 MG/0.6ML ~~LOC~~ SOSY
6.0000 mg | PREFILLED_SYRINGE | Freq: Once | SUBCUTANEOUS | Status: AC
  Administered 2019-06-16: 6 mg via SUBCUTANEOUS

## 2019-06-16 MED ORDER — HEPARIN SOD (PORK) LOCK FLUSH 100 UNIT/ML IV SOLN
500.0000 [IU] | Freq: Once | INTRAVENOUS | Status: AC | PRN
  Administered 2019-06-16: 500 [IU]
  Filled 2019-06-16: qty 5

## 2019-06-16 MED ORDER — PEGFILGRASTIM-CBQV 6 MG/0.6ML ~~LOC~~ SOSY
PREFILLED_SYRINGE | SUBCUTANEOUS | Status: AC
  Filled 2019-06-16: qty 0.6

## 2019-06-16 MED ORDER — SODIUM CHLORIDE 0.9% FLUSH
10.0000 mL | INTRAVENOUS | Status: DC | PRN
  Administered 2019-06-16: 10 mL
  Filled 2019-06-16: qty 10

## 2019-06-16 NOTE — Patient Instructions (Signed)

## 2019-06-17 ENCOUNTER — Telehealth: Payer: Self-pay | Admitting: *Deleted

## 2019-06-17 NOTE — Telephone Encounter (Signed)
Having some diarrhea and asking if OK to take Imodium? Informed him OK to take Imodium and please call if not under control in 24 hours.

## 2019-06-18 ENCOUNTER — Telehealth: Payer: Self-pay | Admitting: *Deleted

## 2019-06-18 NOTE — Telephone Encounter (Signed)
Has H/O sarcoidosis and has had left thoracotomy. Tends to get bronchitis in the winter. IF he needs to be treated is it OK to take antibiotic, cough med and prednisone if PCP thinks he needs it? Informed him this would be OK in regards to his chemo.

## 2019-06-23 ENCOUNTER — Encounter (HOSPITAL_COMMUNITY)
Admission: RE | Admit: 2019-06-23 | Discharge: 2019-06-23 | Disposition: A | Discharge status: Home / Self Care | Payer: Self-pay | Source: Ambulatory Visit | Attending: Interventional Cardiology | Admitting: Interventional Cardiology

## 2019-06-23 NOTE — Progress Notes (Signed)
[facility]  Patient is currently undergoing chemotherapy and is sometimes tired and having bone pain after his treatment and isn't able to exercise on those days. Patient states yesterday was the first day he was able to walk a mile without pain since his last treatment. Patient finds the exercise in between his chemo treatments to be beneficial in helping him stay in shape. Patient also states that the app is beneficial for him being able to log his exercise and his vital signs for [place] to review.  Luis Passer, MS, ACSM [facility] Doom 06/23/2019 [phone]

## 2019-06-27 ENCOUNTER — Other Ambulatory Visit: Payer: Self-pay | Admitting: Interventional Cardiology

## 2019-06-28 ENCOUNTER — Other Ambulatory Visit: Payer: Self-pay | Admitting: Oncology

## 2019-07-02 ENCOUNTER — Inpatient Hospital Stay: Payer: Medicare PPO

## 2019-07-02 ENCOUNTER — Inpatient Hospital Stay (HOSPITAL_BASED_OUTPATIENT_CLINIC_OR_DEPARTMENT_OTHER): Payer: Medicare PPO | Admitting: Oncology

## 2019-07-02 ENCOUNTER — Other Ambulatory Visit: Payer: Self-pay

## 2019-07-02 VITALS — BP 125/68 | HR 66 | Temp 97.7°F | Resp 18 | Ht 71.0 in | Wt 235.3 lb

## 2019-07-02 DIAGNOSIS — C180 Malignant neoplasm of cecum: Secondary | ICD-10-CM | POA: Diagnosis not present

## 2019-07-02 DIAGNOSIS — Z95828 Presence of other vascular implants and grafts: Secondary | ICD-10-CM

## 2019-07-02 DIAGNOSIS — Z5111 Encounter for antineoplastic chemotherapy: Secondary | ICD-10-CM | POA: Diagnosis not present

## 2019-07-02 LAB — CBC WITH DIFFERENTIAL (CANCER CENTER ONLY)
Abs Immature Granulocytes: 0.05 10*3/uL (ref 0.00–0.07)
Basophils Absolute: 0 10*3/uL (ref 0.0–0.1)
Basophils Relative: 1 %
Eosinophils Absolute: 0.1 10*3/uL (ref 0.0–0.5)
Eosinophils Relative: 2 %
HCT: 37.5 % — ABNORMAL LOW (ref 39.0–52.0)
Hemoglobin: 12.6 g/dL — ABNORMAL LOW (ref 13.0–17.0)
Immature Granulocytes: 1 %
Lymphocytes Relative: 7 %
Lymphs Abs: 0.5 10*3/uL — ABNORMAL LOW (ref 0.7–4.0)
MCH: 33.4 pg (ref 26.0–34.0)
MCHC: 33.6 g/dL (ref 30.0–36.0)
MCV: 99.5 fL (ref 80.0–100.0)
Monocytes Absolute: 0.8 10*3/uL (ref 0.1–1.0)
Monocytes Relative: 11 %
Neutro Abs: 5.6 10*3/uL (ref 1.7–7.7)
Neutrophils Relative %: 78 %
Platelet Count: 143 10*3/uL — ABNORMAL LOW (ref 150–400)
RBC: 3.77 MIL/uL — ABNORMAL LOW (ref 4.22–5.81)
RDW: 16.3 % — ABNORMAL HIGH (ref 11.5–15.5)
WBC Count: 7.1 10*3/uL (ref 4.0–10.5)
nRBC: 0 % (ref 0.0–0.2)

## 2019-07-02 LAB — CMP (CANCER CENTER ONLY)
ALT: 40 U/L (ref 0–44)
AST: 45 U/L — ABNORMAL HIGH (ref 15–41)
Albumin: 3.8 g/dL (ref 3.5–5.0)
Alkaline Phosphatase: 129 U/L — ABNORMAL HIGH (ref 38–126)
Anion gap: 10 (ref 5–15)
BUN: 15 mg/dL (ref 8–23)
CO2: 26 mmol/L (ref 22–32)
Calcium: 8.3 mg/dL — ABNORMAL LOW (ref 8.9–10.3)
Chloride: 103 mmol/L (ref 98–111)
Creatinine: 0.81 mg/dL (ref 0.61–1.24)
GFR, Est AFR Am: >60 mL/min (ref >60)
GFR, Estimated: >60 mL/min (ref >60)
Glucose, Bld: 138 mg/dL — ABNORMAL HIGH (ref 70–99)
Potassium: 4.5 mmol/L (ref 3.5–5.1)
Sodium: 139 mmol/L (ref 135–145)
Total Bilirubin: 0.5 mg/dL (ref 0.3–1.2)
Total Protein: 6.4 g/dL — ABNORMAL LOW (ref 6.5–8.1)

## 2019-07-02 LAB — CEA (IN HOUSE-CHCC): CEA (CHCC-In House): 3.71 ng/mL (ref 0.00–5.00)

## 2019-07-02 MED ORDER — DEXTROSE 5 % IV SOLN
Freq: Once | INTRAVENOUS | Status: AC
  Filled 2019-07-02: qty 250

## 2019-07-02 MED ORDER — FLUOROURACIL CHEMO INJECTION 2.5 GM/50ML
400.0000 mg/m2 | Freq: Once | INTRAVENOUS | Status: AC
  Administered 2019-07-02: 16:00:00 900 mg via INTRAVENOUS
  Filled 2019-07-02: qty 18

## 2019-07-02 MED ORDER — PALONOSETRON HCL INJECTION 0.25 MG/5ML
0.2500 mg | Freq: Once | INTRAVENOUS | Status: AC
  Administered 2019-07-02: 13:00:00 0.25 mg via INTRAVENOUS

## 2019-07-02 MED ORDER — DEXAMETHASONE SODIUM PHOSPHATE 10 MG/ML IJ SOLN
10.0000 mg | Freq: Once | INTRAMUSCULAR | Status: AC
  Administered 2019-07-02: 13:00:00 10 mg via INTRAVENOUS

## 2019-07-02 MED ORDER — DEXAMETHASONE SODIUM PHOSPHATE 10 MG/ML IJ SOLN
INTRAMUSCULAR | Status: AC
  Filled 2019-07-02: qty 1

## 2019-07-02 MED ORDER — SODIUM CHLORIDE 0.9 % IV SOLN
2400.0000 mg/m2 | INTRAVENOUS | Status: DC
  Administered 2019-07-02: 16:00:00 5450 mg via INTRAVENOUS
  Filled 2019-07-02: qty 109

## 2019-07-02 MED ORDER — SODIUM CHLORIDE 0.9% FLUSH
10.0000 mL | INTRAVENOUS | Status: DC | PRN
  Administered 2019-07-02: 10 mL
  Filled 2019-07-02: qty 10

## 2019-07-02 MED ORDER — PALONOSETRON HCL INJECTION 0.25 MG/5ML
INTRAVENOUS | Status: AC
  Filled 2019-07-02: qty 5

## 2019-07-02 MED ORDER — OXALIPLATIN CHEMO INJECTION 100 MG/20ML
55.0000 mg/m2 | Freq: Once | INTRAVENOUS | Status: AC
  Administered 2019-07-02: 14:00:00 125 mg via INTRAVENOUS
  Filled 2019-07-02: qty 25

## 2019-07-02 MED ORDER — LEUCOVORIN CALCIUM INJECTION 350 MG
400.0000 mg/m2 | Freq: Once | INTRAVENOUS | Status: AC
  Administered 2019-07-02: 912 mg via INTRAVENOUS
  Filled 2019-07-02: qty 45.6

## 2019-07-02 NOTE — Patient Instructions (Signed)
[facility] Discharge Instructions for Patients Receiving Chemotherapy  Today you received the following chemotherapy agents: Oxaliplatin/Leucovorin/5FU  To help prevent nausea and vomiting after your treatment, we encourage you to take your nausea medication as directed.   If you develop nausea and vomiting that is not controlled by your nausea medication, call the clinic.   BELOW ARE SYMPTOMS THAT SHOULD BE REPORTED IMMEDIATELY:  *FEVER GREATER THAN 100.5 F  *CHILLS WITH OR WITHOUT FEVER  NAUSEA AND VOMITING THAT IS NOT CONTROLLED WITH YOUR NAUSEA MEDICATION  *UNUSUAL SHORTNESS OF BREATH  *UNUSUAL BRUISING OR BLEEDING  TENDERNESS IN MOUTH AND THROAT WITH OR WITHOUT PRESENCE OF ULCERS  *URINARY PROBLEMS  *BOWEL PROBLEMS  UNUSUAL RASH Items with * indicate a potential emergency and should be followed up as soon as possible.  Feel free to call the clinic should you have any questions or concerns. The clinic phone number is [phone].  Please show the CHEMO ALERT CARD at check-in to the Emergency Department and triage nurse.   

## 2019-07-02 NOTE — Progress Notes (Signed)
[place] OFFICE PROGRESS NOTE   Diagnosis: Colon cancer  INTERVAL HISTORY:   Luis [name] completed another cycle of FOLFOX on 06/14/2019.  No nausea/vomiting, mouth sores, or diarrhea.  He had prolonged cold sensitivity following chemotherapy.  No neuropathy symptoms at present.  He reports increased exertional dyspnea.  No chest pain, cough, or fever.  No dyspnea ambulating into the office today. He reports prolonged bone pain after receiving Udenyca.  Objective:  Vital signs in last 24 hours:  Blood pressure 125/68, pulse 66, temperature 97.7 F (36.5 C), temperature source Temporal, resp. rate 18, height '5\' 11"'$  (1.803 m), weight 235 lb 4.8 oz (106.7 kg), SpO2 98 %.    HEENT: No thrush or ulcers Resp: Lungs clear bilaterally, no respiratory distress Cardio: Regular rate and rhythm GI: No hepatomegaly, no mass, nontender Vascular: No leg edema Neuro: Loss of vibratory sense at the negatives bilaterally Skin: Palms without erythema  Portacath/PICC-without erythema  Lab Results:  Lab Results  Component Value Date   WBC 7.1 07/02/2019   HGB 12.6 (L) 07/02/2019   HCT 37.5 (L) 07/02/2019   MCV 99.5 07/02/2019   PLT 143 (L) 07/02/2019   NEUTROABS 5.6 07/02/2019    CMP  Lab Results  Component Value Date   NA 139 06/10/2019   K 4.2 06/10/2019   CL 103 06/10/2019   CO2 26 06/10/2019   GLUCOSE 137 (H) 06/10/2019   BUN 16 06/10/2019   CREATININE 0.88 06/10/2019   CALCIUM 8.6 (L) 06/10/2019   PROT 6.3 (L) 06/10/2019   ALBUMIN 3.8 06/10/2019   AST 42 (H) 06/10/2019   ALT 33 06/10/2019   ALKPHOS 120 06/10/2019   BILITOT 0.5 06/10/2019   GFRNONAA >60 06/10/2019   GFRAA >60 06/10/2019    Lab Results  Component Value Date   CEA1 3.30 05/27/2019    Medications: I have reviewed the patient's current medications.   Assessment/Plan: 1. Adenocarcinoma of the cecum, stage IIA(T3N0), status post a right colectomy 07/26/2017 ? 0/16 lymph nodes positive,  no lymphovascular invasion, perineural invasion present ? MSI-stable, no loss of mismatch repair protein expression ? Surveillance colonoscopy 07/26/2018-no polyps found ? Elevated CEA June 2020, persistently elevated August 2020 ? CTs 02/07/2019-bilateral liver metastases, no extrahepatic metastatic disease, changes of sarcoidosis in the chest ? Biopsy liver lesion 03/04/2019-metastatic adenocarcinoma, with morphology consistent with metastatic colonic adenocarcinoma ? Cycle 1 FOLFOX 03/12/2019 ? Cycle 2 FOLFOX 03/26/2019, oxaliplatin dose reduced secondary to thrombocytopenia ? Cycle 3 FOLFOX 04/09/2019, oxaliplatin further dose reduced and aspirin held secondary to thrombocytopenia ? Cycle 4 FOLFOX 04/23/2019 ? Cycle 5 FOLFOX 05/07/2019 ? CTs 05/23/2019-decrease in liver lesions.  No new or progressive findings. ? Cycle 6 FOLFOX 05/27/2019 ? Cycle 7 FOLFOX 06/14/2019 ? Cycle 8 FOLFOX 07/02/2019-Udenyca held 2. History of colon polyps 3. Microcytic anemia January 2019 4. Sarcoidosis 5. Coronary artery disease 6. Port-A-Cath placement, interventional radiology, 03/04/2019 7. Thrombocytopenia, mild thrombocytopenia predating chemotherapy 8. Oxaliplatin neuropathy    Disposition: Luis [name] has completed 7 cycles of FOLFOX.  He continues to tolerate the chemotherapy well.  We will hold Udenyca with this cycle secondary to bone pain.  The neutrophil count is adequate today.  I doubt the exertional dyspnea is related to chemotherapy or colon cancer.  I will refer him to cardiology or his primary physician if the dyspnea persists.  He has mild oxaliplatin neuropathy.  Oxaliplatin will be discontinued if the neuropathy symptoms progress.  Luis [name] has received a COVID-19 vaccine.  He will  return for an office visit and chemotherapy in 2 weeks.  The plan is to schedule a restaging CT evaluation after cycle 10 FOLFOX. Betsy Coder, MD     07/02/2019  12:01 PM

## 2019-07-03 MED ORDER — ONDANSETRON HCL 8 MG PO TABS
ORAL_TABLET | ORAL | Status: AC
  Filled 2019-07-03: qty 1

## 2019-07-04 ENCOUNTER — Inpatient Hospital Stay: Payer: Medicare PPO

## 2019-07-04 ENCOUNTER — Other Ambulatory Visit: Payer: Self-pay

## 2019-07-04 ENCOUNTER — Telehealth: Payer: Self-pay | Admitting: Oncology

## 2019-07-04 VITALS — BP 117/69 | HR 64 | Temp 98.7°F | Resp 18

## 2019-07-04 DIAGNOSIS — Z5111 Encounter for antineoplastic chemotherapy: Secondary | ICD-10-CM | POA: Diagnosis not present

## 2019-07-04 DIAGNOSIS — Z95828 Presence of other vascular implants and grafts: Secondary | ICD-10-CM

## 2019-07-04 MED ORDER — HEPARIN SOD (PORK) LOCK FLUSH 100 UNIT/ML IV SOLN
500.0000 [IU] | Freq: Once | INTRAVENOUS | Status: AC | PRN
  Administered 2019-07-04: 500 [IU]
  Filled 2019-07-04: qty 5

## 2019-07-04 MED ORDER — SODIUM CHLORIDE 0.9% FLUSH
10.0000 mL | INTRAVENOUS | Status: DC | PRN
  Administered 2019-07-04: 10 mL
  Filled 2019-07-04: qty 10

## 2019-07-04 NOTE — Telephone Encounter (Signed)
Scheduled per los. Called and left msg. Mailed pirntout  °

## 2019-07-04 NOTE — Patient Instructions (Signed)

## 2019-07-13 ENCOUNTER — Other Ambulatory Visit: Payer: Self-pay | Admitting: Oncology

## 2019-07-16 ENCOUNTER — Inpatient Hospital Stay: Payer: Medicare PPO | Attending: Nurse Practitioner | Admitting: Oncology

## 2019-07-16 ENCOUNTER — Other Ambulatory Visit: Payer: Self-pay

## 2019-07-16 ENCOUNTER — Inpatient Hospital Stay: Payer: Medicare PPO

## 2019-07-16 VITALS — BP 129/56 | HR 62 | Temp 99.1°F | Resp 18 | Ht 71.0 in | Wt 236.5 lb

## 2019-07-16 DIAGNOSIS — C787 Secondary malignant neoplasm of liver and intrahepatic bile duct: Secondary | ICD-10-CM | POA: Diagnosis not present

## 2019-07-16 DIAGNOSIS — Z95828 Presence of other vascular implants and grafts: Secondary | ICD-10-CM

## 2019-07-16 DIAGNOSIS — C180 Malignant neoplasm of cecum: Secondary | ICD-10-CM

## 2019-07-16 DIAGNOSIS — Z5111 Encounter for antineoplastic chemotherapy: Secondary | ICD-10-CM | POA: Diagnosis present

## 2019-07-16 LAB — CBC WITH DIFFERENTIAL (CANCER CENTER ONLY)
Abs Immature Granulocytes: 0.01 10*3/uL (ref 0.00–0.07)
Basophils Absolute: 0 10*3/uL (ref 0.0–0.1)
Basophils Relative: 1 %
Eosinophils Absolute: 0.2 10*3/uL (ref 0.0–0.5)
Eosinophils Relative: 4 %
HCT: 35 % — ABNORMAL LOW (ref 39.0–52.0)
Hemoglobin: 12 g/dL — ABNORMAL LOW (ref 13.0–17.0)
Immature Granulocytes: 0 %
Lymphocytes Relative: 8 %
Lymphs Abs: 0.4 10*3/uL — ABNORMAL LOW (ref 0.7–4.0)
MCH: 33.4 pg (ref 26.0–34.0)
MCHC: 34.3 g/dL (ref 30.0–36.0)
MCV: 97.5 fL (ref 80.0–100.0)
Monocytes Absolute: 0.8 10*3/uL (ref 0.1–1.0)
Monocytes Relative: 15 %
Neutro Abs: 3.8 10*3/uL (ref 1.7–7.7)
Neutrophils Relative %: 72 %
Platelet Count: 113 10*3/uL — ABNORMAL LOW (ref 150–400)
RBC: 3.59 MIL/uL — ABNORMAL LOW (ref 4.22–5.81)
RDW: 15.1 % (ref 11.5–15.5)
WBC Count: 5.2 10*3/uL (ref 4.0–10.5)
nRBC: 0 % (ref 0.0–0.2)

## 2019-07-16 LAB — CMP (CANCER CENTER ONLY)
ALT: 34 U/L (ref 0–44)
AST: 43 U/L — ABNORMAL HIGH (ref 15–41)
Albumin: 3.6 g/dL (ref 3.5–5.0)
Alkaline Phosphatase: 109 U/L (ref 38–126)
Anion gap: 5 (ref 5–15)
BUN: 15 mg/dL (ref 8–23)
CO2: 28 mmol/L (ref 22–32)
Calcium: 8.4 mg/dL — ABNORMAL LOW (ref 8.9–10.3)
Chloride: 105 mmol/L (ref 98–111)
Creatinine: 0.83 mg/dL (ref 0.61–1.24)
GFR, Est AFR Am: >60 mL/min (ref >60)
GFR, Estimated: >60 mL/min (ref >60)
Glucose, Bld: 111 mg/dL — ABNORMAL HIGH (ref 70–99)
Potassium: 4.5 mmol/L (ref 3.5–5.1)
Sodium: 138 mmol/L (ref 135–145)
Total Bilirubin: 0.5 mg/dL (ref 0.3–1.2)
Total Protein: 6 g/dL — ABNORMAL LOW (ref 6.5–8.1)

## 2019-07-16 MED ORDER — PALONOSETRON HCL INJECTION 0.25 MG/5ML
0.2500 mg | Freq: Once | INTRAVENOUS | Status: AC
  Administered 2019-07-16: 0.25 mg via INTRAVENOUS

## 2019-07-16 MED ORDER — DEXAMETHASONE SODIUM PHOSPHATE 10 MG/ML IJ SOLN
10.0000 mg | Freq: Once | INTRAMUSCULAR | Status: AC
  Administered 2019-07-16: 10 mg via INTRAVENOUS

## 2019-07-16 MED ORDER — FLUOROURACIL CHEMO INJECTION 2.5 GM/50ML
400.0000 mg/m2 | Freq: Once | INTRAVENOUS | Status: AC
  Administered 2019-07-16: 900 mg via INTRAVENOUS
  Filled 2019-07-16: qty 18

## 2019-07-16 MED ORDER — DEXAMETHASONE SODIUM PHOSPHATE 10 MG/ML IJ SOLN
INTRAMUSCULAR | Status: AC
  Filled 2019-07-16: qty 1

## 2019-07-16 MED ORDER — OXALIPLATIN CHEMO INJECTION 100 MG/20ML
55.0000 mg/m2 | Freq: Once | INTRAVENOUS | Status: AC
  Administered 2019-07-16: 125 mg via INTRAVENOUS
  Filled 2019-07-16: qty 25

## 2019-07-16 MED ORDER — DEXTROSE 5 % IV SOLN
Freq: Once | INTRAVENOUS | Status: AC
  Filled 2019-07-16: qty 250

## 2019-07-16 MED ORDER — SODIUM CHLORIDE 0.9 % IV SOLN
2400.0000 mg/m2 | INTRAVENOUS | Status: DC
  Administered 2019-07-16: 5450 mg via INTRAVENOUS
  Filled 2019-07-16: qty 109

## 2019-07-16 MED ORDER — SODIUM CHLORIDE 0.9% FLUSH
10.0000 mL | INTRAVENOUS | Status: DC | PRN
  Administered 2019-07-16: 10:00:00 10 mL
  Filled 2019-07-16: qty 10

## 2019-07-16 MED ORDER — PALONOSETRON HCL INJECTION 0.25 MG/5ML
INTRAVENOUS | Status: AC
  Filled 2019-07-16: qty 5

## 2019-07-16 MED ORDER — LEUCOVORIN CALCIUM INJECTION 350 MG
400.0000 mg/m2 | Freq: Once | INTRAVENOUS | Status: AC
  Administered 2019-07-16: 12:00:00 912 mg via INTRAVENOUS
  Filled 2019-07-16: qty 45.6

## 2019-07-16 NOTE — Progress Notes (Signed)
  [place] OFFICE PROGRESS NOTE   Diagnosis: Colon cancer  INTERVAL HISTORY:   Mr. Hoos completed another cycle of FOLFOX on 07/02/2019.  No nausea/vomiting, mouth sores, or diarrhea.  He feels well.  He is working.  He reports cold sensitivity for several days following chemotherapy.  No neuropathy symptoms at present.  Objective:  Vital signs in last 24 hours:  Blood pressure (!) 129/56, pulse 62, temperature 99.1 F (37.3 C), temperature source Temporal, resp. rate 18, height '5\' 11"'$  (1.803 m), weight 236 lb 8 oz (107.3 kg), SpO2 96 %.    Limited physical examination secondary to distancing GI: No hepatomegaly, no mass, nontender Vascular: No leg edema Neuro: Mild loss of vibratory sense at the greater than left fingers    Portacath/PICC-without erythema  Lab Results:  Lab Results  Component Value Date   WBC 5.2 07/16/2019   HGB 12.0 (L) 07/16/2019   HCT 35.0 (L) 07/16/2019   MCV 97.5 07/16/2019   PLT 113 (L) 07/16/2019   NEUTROABS 3.8 07/16/2019    CMP  Lab Results  Component Value Date   NA 139 07/02/2019   K 4.5 07/02/2019   CL 103 07/02/2019   CO2 26 07/02/2019   GLUCOSE 138 (H) 07/02/2019   BUN 15 07/02/2019   CREATININE 0.81 07/02/2019   CALCIUM 8.3 (L) 07/02/2019   PROT 6.4 (L) 07/02/2019   ALBUMIN 3.8 07/02/2019   AST 45 (H) 07/02/2019   ALT 40 07/02/2019   ALKPHOS 129 (H) 07/02/2019   BILITOT 0.5 07/02/2019   GFRNONAA >60 07/02/2019   GFRAA >60 07/02/2019    Lab Results  Component Value Date   CEA1 3.71 07/02/2019    Medications: I have reviewed the patient's current medications.   Assessment/Plan: 1. Adenocarcinoma of the cecum, stage IIA(T3N0), status post a right colectomy 07/26/2017 ? 0/16 lymph nodes positive, no lymphovascular invasion, perineural invasion present ? MSI-stable, no loss of mismatch repair protein expression ? Surveillance colonoscopy 07/26/2018-no polyps found ? Elevated CEA June 2020,  persistently elevated August 2020 ? CTs 02/07/2019-bilateral liver metastases, no extrahepatic metastatic disease, changes of sarcoidosis in the chest ? Biopsy liver lesion 03/04/2019-metastatic adenocarcinoma, with morphology consistent with metastatic colonic adenocarcinoma ? Cycle 1 FOLFOX 03/12/2019 ? Cycle 2 FOLFOX 03/26/2019, oxaliplatin dose reduced secondary to thrombocytopenia ? Cycle 3 FOLFOX 04/09/2019, oxaliplatin further dose reduced and aspirin held secondary to thrombocytopenia ? Cycle 4 FOLFOX 04/23/2019 ? Cycle 5 FOLFOX 05/07/2019 ? CTs 05/23/2019-decrease in liver lesions.  No new or progressive findings. ? Cycle 6 FOLFOX 05/27/2019 ? Cycle 7 FOLFOX 06/14/2019 ? Cycle 8 FOLFOX 07/02/2019-Udenyca held ? Cycle 9 FOLFOX 07/16/2019-Udenyca held 2. History of colon polyps 3. Microcytic anemia January 2019 4. Sarcoidosis 5. Coronary artery disease 6. Port-A-Cath placement, interventional radiology, 03/04/2019 7. Thrombocytopenia, mild thrombocytopenia predating chemotherapy 8. Oxaliplatin neuropathy     Disposition: Mr. Sky appears unchanged.  He is tolerating the chemotherapy well.  He has mild oxaliplatin neuropathy symptoms.  He will complete another cycle of FOLFOX today.  Ellen Henri will be held again today.  Mr. Parlee will return for an office visit and chemotherapy in 2 weeks.  He will undergo a restaging CT evaluation after cycle 10.  Betsy Coder, MD  07/16/2019  10:25 AM

## 2019-07-16 NOTE — Patient Instructions (Signed)
[facility] Discharge Instructions for Patients Receiving Chemotherapy  Today you received the following chemotherapy agents Oxaliplatin, Leucovorin and Adrucil   To help prevent nausea and vomiting after your treatment, we encourage you to take your nausea medication as directed.    If you develop nausea and vomiting that is not controlled by your nausea medication, call the clinic.   BELOW ARE SYMPTOMS THAT SHOULD BE REPORTED IMMEDIATELY:  *FEVER GREATER THAN 100.5 F  *CHILLS WITH OR WITHOUT FEVER  NAUSEA AND VOMITING THAT IS NOT CONTROLLED WITH YOUR NAUSEA MEDICATION  *UNUSUAL SHORTNESS OF BREATH  *UNUSUAL BRUISING OR BLEEDING  TENDERNESS IN MOUTH AND THROAT WITH OR WITHOUT PRESENCE OF ULCERS  *URINARY PROBLEMS  *BOWEL PROBLEMS  UNUSUAL RASH Items with * indicate a potential emergency and should be followed up as soon as possible.  Feel free to call the clinic should you have any questions or concerns. The clinic phone number is [phone].  Please show the CHEMO ALERT CARD at check-in to the Emergency Department and triage nurse.   

## 2019-07-18 ENCOUNTER — Inpatient Hospital Stay: Payer: Medicare PPO

## 2019-07-18 ENCOUNTER — Telehealth: Payer: Self-pay | Admitting: Physician Assistant

## 2019-07-18 ENCOUNTER — Other Ambulatory Visit: Payer: Self-pay

## 2019-07-18 VITALS — BP 128/62 | HR 62 | Temp 98.7°F | Resp 18

## 2019-07-18 DIAGNOSIS — C180 Malignant neoplasm of cecum: Secondary | ICD-10-CM

## 2019-07-18 MED ORDER — SODIUM CHLORIDE 0.9% FLUSH
10.0000 mL | INTRAVENOUS | Status: DC | PRN
  Filled 2019-07-18: qty 10

## 2019-07-18 MED ORDER — HEPARIN SOD (PORK) LOCK FLUSH 100 UNIT/ML IV SOLN
500.0000 [IU] | Freq: Once | INTRAVENOUS | Status: DC | PRN
  Filled 2019-07-18: qty 5

## 2019-07-18 NOTE — Telephone Encounter (Signed)
OK to add back RAnexa 500 mg BID.  JV

## 2019-07-18 NOTE — Telephone Encounter (Signed)
Patient calling stating he is having  Pt c/o Shortness Of Breath: STAT if SOB developed within the last 24 hours or pt is noticeably SOB on the phone  1. Are you currently SOB (can you hear that pt is SOB on the phone)? no  2. How long have you been experiencing SOB? Few weeks  3. Are you SOB when sitting or when up moving around? Moving around and walking briskly   4. Are you currently experiencing any other symptoms? No  Patient states he had SOB before and took Ranexa, which helped. He would like to know if he should start that medication again or be seen first.

## 2019-07-18 NOTE — Telephone Encounter (Signed)
Called and spoke to patient. He states that for the past several weeks that he has had increased SOB on exertion, especially when he goes for his morning walk [place]. He states that he has not been able to be as active as he used to. He denies SOB at rest, arm pain, chest pain, swelling, or any other Sx. Patient states that he has gained a little weight 5-10 lbs in the past several weeks that attributes to being on steroids. BP 125/55 HR 60-70s. Patient is taking his plavix and other meds as prescribed. He states that he was on Ranexa in the past and his SOB was better while on it. Denies NTG use. Patient had Cath in 01/02/19 with DES to LAD and balloon angioplasty to diagonal bifurcation. Will forward to Dr. Irish Lack for review and recommendation.

## 2019-07-21 MED ORDER — RANOLAZINE ER 500 MG PO TB12: 500.0000 mg | ORAL_TABLET | Freq: Two times a day (BID) | ORAL | 3 refills | Status: DC

## 2019-07-21 NOTE — Telephone Encounter (Signed)
Called and spoke to patient and made him aware that Dr. Irish Lack would like to restart ranexa 500 mg BID. Instructed patient to let us know if his Sx did not improve. Patient verbalized understanding and thanked me for the call.

## 2019-07-27 ENCOUNTER — Other Ambulatory Visit: Payer: Self-pay | Admitting: Oncology

## 2019-07-30 ENCOUNTER — Inpatient Hospital Stay (HOSPITAL_BASED_OUTPATIENT_CLINIC_OR_DEPARTMENT_OTHER): Payer: Medicare PPO | Admitting: Oncology

## 2019-07-30 ENCOUNTER — Other Ambulatory Visit: Payer: Self-pay

## 2019-07-30 ENCOUNTER — Inpatient Hospital Stay: Payer: Medicare PPO

## 2019-07-30 VITALS — BP 132/78 | HR 60 | Temp 98.5°F | Resp 17 | Ht 71.0 in | Wt 236.7 lb

## 2019-07-30 DIAGNOSIS — Z5111 Encounter for antineoplastic chemotherapy: Secondary | ICD-10-CM | POA: Diagnosis not present

## 2019-07-30 DIAGNOSIS — Z95828 Presence of other vascular implants and grafts: Secondary | ICD-10-CM

## 2019-07-30 DIAGNOSIS — C180 Malignant neoplasm of cecum: Secondary | ICD-10-CM

## 2019-07-30 LAB — CBC WITH DIFFERENTIAL (CANCER CENTER ONLY)
Abs Immature Granulocytes: 0.01 10*3/uL (ref 0.00–0.07)
Basophils Absolute: 0 10*3/uL (ref 0.0–0.1)
Basophils Relative: 1 %
Eosinophils Absolute: 0.2 10*3/uL (ref 0.0–0.5)
Eosinophils Relative: 5 %
HCT: 36.1 % — ABNORMAL LOW (ref 39.0–52.0)
Hemoglobin: 12.4 g/dL — ABNORMAL LOW (ref 13.0–17.0)
Immature Granulocytes: 0 %
Lymphocytes Relative: 12 %
Lymphs Abs: 0.5 10*3/uL — ABNORMAL LOW (ref 0.7–4.0)
MCH: 33.7 pg (ref 26.0–34.0)
MCHC: 34.3 g/dL (ref 30.0–36.0)
MCV: 98.1 fL (ref 80.0–100.0)
Monocytes Absolute: 0.7 10*3/uL (ref 0.1–1.0)
Monocytes Relative: 18 %
Neutro Abs: 2.5 10*3/uL (ref 1.7–7.7)
Neutrophils Relative %: 64 %
Platelet Count: 82 10*3/uL — ABNORMAL LOW (ref 150–400)
RBC: 3.68 MIL/uL — ABNORMAL LOW (ref 4.22–5.81)
RDW: 14.6 % (ref 11.5–15.5)
WBC Count: 3.9 10*3/uL — ABNORMAL LOW (ref 4.0–10.5)
nRBC: 0 % (ref 0.0–0.2)

## 2019-07-30 LAB — CMP (CANCER CENTER ONLY)
ALT: 39 U/L (ref 0–44)
AST: 47 U/L — ABNORMAL HIGH (ref 15–41)
Albumin: 3.8 g/dL (ref 3.5–5.0)
Alkaline Phosphatase: 89 U/L (ref 38–126)
Anion gap: 6 (ref 5–15)
BUN: 16 mg/dL (ref 8–23)
CO2: 27 mmol/L (ref 22–32)
Calcium: 8.5 mg/dL — ABNORMAL LOW (ref 8.9–10.3)
Chloride: 104 mmol/L (ref 98–111)
Creatinine: 0.9 mg/dL (ref 0.61–1.24)
GFR, Est AFR Am: >60 mL/min (ref >60)
GFR, Estimated: >60 mL/min (ref >60)
Glucose, Bld: 93 mg/dL (ref 70–99)
Potassium: 4.5 mmol/L (ref 3.5–5.1)
Sodium: 137 mmol/L (ref 135–145)
Total Bilirubin: 0.6 mg/dL (ref 0.3–1.2)
Total Protein: 6.4 g/dL — ABNORMAL LOW (ref 6.5–8.1)

## 2019-07-30 LAB — CEA (IN HOUSE-CHCC): CEA (CHCC-In House): 2.53 ng/mL (ref 0.00–5.00)

## 2019-07-30 MED ORDER — PALONOSETRON HCL INJECTION 0.25 MG/5ML
INTRAVENOUS | Status: AC
  Filled 2019-07-30: qty 5

## 2019-07-30 MED ORDER — FLUOROURACIL CHEMO INJECTION 2.5 GM/50ML
400.0000 mg/m2 | Freq: Once | INTRAVENOUS | Status: AC
  Administered 2019-07-30: 900 mg via INTRAVENOUS
  Filled 2019-07-30: qty 18

## 2019-07-30 MED ORDER — DEXTROSE 5 % IV SOLN
Freq: Once | INTRAVENOUS | Status: AC
  Filled 2019-07-30: qty 250

## 2019-07-30 MED ORDER — SODIUM CHLORIDE 0.9 % IV SOLN
2400.0000 mg/m2 | INTRAVENOUS | Status: DC
  Administered 2019-07-30: 5450 mg via INTRAVENOUS
  Filled 2019-07-30: qty 109

## 2019-07-30 MED ORDER — PALONOSETRON HCL INJECTION 0.25 MG/5ML
0.2500 mg | Freq: Once | INTRAVENOUS | Status: AC
  Administered 2019-07-30: 0.25 mg via INTRAVENOUS

## 2019-07-30 MED ORDER — DEXAMETHASONE SODIUM PHOSPHATE 10 MG/ML IJ SOLN
INTRAMUSCULAR | Status: AC
  Filled 2019-07-30: qty 1

## 2019-07-30 MED ORDER — SODIUM CHLORIDE 0.9% FLUSH
10.0000 mL | INTRAVENOUS | Status: DC | PRN
  Administered 2019-07-30: 10 mL
  Filled 2019-07-30: qty 10

## 2019-07-30 MED ORDER — LEUCOVORIN CALCIUM INJECTION 350 MG
400.0000 mg/m2 | Freq: Once | INTRAVENOUS | Status: AC
  Administered 2019-07-30: 912 mg via INTRAVENOUS
  Filled 2019-07-30: qty 45.6

## 2019-07-30 MED ORDER — OXALIPLATIN CHEMO INJECTION 100 MG/20ML
55.0000 mg/m2 | Freq: Once | INTRAVENOUS | Status: AC
  Administered 2019-07-30: 125 mg via INTRAVENOUS
  Filled 2019-07-30: qty 25

## 2019-07-30 MED ORDER — DEXAMETHASONE SODIUM PHOSPHATE 10 MG/ML IJ SOLN
10.0000 mg | Freq: Once | INTRAMUSCULAR | Status: AC
  Administered 2019-07-30: 10 mg via INTRAVENOUS

## 2019-07-30 NOTE — Progress Notes (Signed)
[place] OFFICE PROGRESS NOTE   Diagnosis: Colon cancer  INTERVAL HISTORY:   Luis [name] completed another cycle of FOLFOX on 07/16/2019.  No nausea or diarrhea.  He reports prolonged cold sensitivity.  No peripheral numbness at present.  He has noted thickening of the skin with a slight feeling at the distal fingers and has to lick his finger prior to returning the page in the paper.  This has been present since beginning chemotherapy and has not progressed.  Objective:  Vital signs in last 24 hours:  Blood pressure 132/78, pulse 60, temperature 98.5 F (36.9 C), temperature source Temporal, resp. rate 17, height '5\' 11"'$  (1.803 m), weight 236 lb 11.2 oz (107.4 kg), SpO2 98 %.   Limited physical examination secondary to distancing with the Covid pandemic GI: No hepatomegaly, nontender Vascular: No leg edema Neuro: Mild loss of vibratory sense at the fingertips bilaterally Skin: Palms without erythema, mild skin thickening of the distal fingers  Portacath/PICC-without erythema  Lab Results:  Lab Results  Component Value Date   WBC 3.9 (L) 07/30/2019   HGB 12.4 (L) 07/30/2019   HCT 36.1 (L) 07/30/2019   MCV 98.1 07/30/2019   PLT 82 (L) 07/30/2019   NEUTROABS 2.5 07/30/2019    CMP  Lab Results  Component Value Date   NA 137 07/30/2019   K 4.5 07/30/2019   CL 104 07/30/2019   CO2 27 07/30/2019   GLUCOSE 93 07/30/2019   BUN 16 07/30/2019   CREATININE 0.90 07/30/2019   CALCIUM 8.5 (L) 07/30/2019   PROT 6.4 (L) 07/30/2019   ALBUMIN 3.8 07/30/2019   AST 47 (H) 07/30/2019   ALT 39 07/30/2019   ALKPHOS 89 07/30/2019   BILITOT 0.6 07/30/2019   GFRNONAA >60 07/30/2019   GFRAA >60 07/30/2019    Lab Results  Component Value Date   CEA1 3.71 07/02/2019     Medications: I have reviewed the patient's current medications.   Assessment/Plan:  1. Adenocarcinoma of the cecum, stage IIA(T3N0), status post a right colectomy 07/26/2017 ? 0/16 lymph nodes  positive, no lymphovascular invasion, perineural invasion present ? MSI-stable, no loss of mismatch repair protein expression ? Surveillance colonoscopy 07/26/2018-no polyps found ? Elevated CEA June 2020, persistently elevated August 2020 ? CTs 02/07/2019-bilateral liver metastases, no extrahepatic metastatic disease, changes of sarcoidosis in the chest ? Biopsy liver lesion 03/04/2019-metastatic adenocarcinoma, with morphology consistent with metastatic colonic adenocarcinoma ? Cycle 1 FOLFOX 03/12/2019 ? Cycle 2 FOLFOX 03/26/2019, oxaliplatin dose reduced secondary to thrombocytopenia ? Cycle 3 FOLFOX 04/09/2019, oxaliplatin further dose reduced and aspirin held secondary to thrombocytopenia ? Cycle 4 FOLFOX 04/23/2019 ? Cycle 5 FOLFOX 05/07/2019 ? CTs 05/23/2019-decrease in liver lesions.  No new or progressive findings. ? Cycle 6 FOLFOX 05/27/2019 ? Cycle 7 FOLFOX 06/14/2019 ? Cycle 8 FOLFOX 07/02/2019-Udenyca held ? Cycle 9 FOLFOX 07/16/2019-Udenyca held ? Cycle 10 FOLFOX 07/30/2019-Udenyca held 2. History of colon polyps 3. Microcytic anemia January 2019 4. Sarcoidosis 5. Coronary artery disease 6. Port-A-Cath placement, interventional radiology, 03/04/2019 7. Thrombocytopenia, mild thrombocytopenia predating chemotherapy 8. Oxaliplatin neuropathy   Disposition: Luis [name] appears unchanged.  He has mild oxaliplatin neuropathy.  He understands the chance of developing progressive and long-lasting neuropathy.  He agrees to proceed with cycle 10 FOLFOX today.  He has mild thrombocytopenia.  He will call for bleeding or bruising.  He has been treated in the past with a platelet count in this range and has not developed severe thrombocytopenia.  He will undergo a  restaging CT after this cycle.  Betsy Coder, MD  07/30/2019  11:37 AM

## 2019-07-30 NOTE — Patient Instructions (Signed)
[facility] Discharge Instructions for Patients Receiving Chemotherapy  Today you received the following chemotherapy agents Oxaliplatin, Leucovorin and Adrucil   To help prevent nausea and vomiting after your treatment, we encourage you to take your nausea medication as directed.    If you develop nausea and vomiting that is not controlled by your nausea medication, call the clinic.   BELOW ARE SYMPTOMS THAT SHOULD BE REPORTED IMMEDIATELY:  *FEVER GREATER THAN 100.5 F  *CHILLS WITH OR WITHOUT FEVER  NAUSEA AND VOMITING THAT IS NOT CONTROLLED WITH YOUR NAUSEA MEDICATION  *UNUSUAL SHORTNESS OF BREATH  *UNUSUAL BRUISING OR BLEEDING  TENDERNESS IN MOUTH AND THROAT WITH OR WITHOUT PRESENCE OF ULCERS  *URINARY PROBLEMS  *BOWEL PROBLEMS  UNUSUAL RASH Items with * indicate a potential emergency and should be followed up as soon as possible.  Feel free to call the clinic should you have any questions or concerns. The clinic phone number is [phone].  Please show the CHEMO ALERT CARD at check-in to the Emergency Department and triage nurse.   

## 2019-07-30 NOTE — Progress Notes (Signed)
Per Dr. Benay Spice: OK to treat w/platelets 82,000

## 2019-08-01 ENCOUNTER — Other Ambulatory Visit: Payer: Self-pay

## 2019-08-01 ENCOUNTER — Telehealth: Payer: Self-pay | Admitting: Oncology

## 2019-08-01 ENCOUNTER — Inpatient Hospital Stay: Payer: Medicare PPO

## 2019-08-01 DIAGNOSIS — C180 Malignant neoplasm of cecum: Secondary | ICD-10-CM

## 2019-08-01 DIAGNOSIS — Z5111 Encounter for antineoplastic chemotherapy: Secondary | ICD-10-CM | POA: Diagnosis not present

## 2019-08-01 MED ORDER — SODIUM CHLORIDE 0.9% FLUSH
10.0000 mL | INTRAVENOUS | Status: DC | PRN
  Administered 2019-08-01: 10 mL
  Filled 2019-08-01: qty 10

## 2019-08-01 MED ORDER — HEPARIN SOD (PORK) LOCK FLUSH 100 UNIT/ML IV SOLN
500.0000 [IU] | Freq: Once | INTRAVENOUS | Status: AC | PRN
  Administered 2019-08-01: 500 [IU]
  Filled 2019-08-01: qty 5

## 2019-08-01 NOTE — Telephone Encounter (Signed)
Scheduled per los. Called and spoke with patient. Confirmed appt 

## 2019-08-04 ENCOUNTER — Encounter (HOSPITAL_COMMUNITY)
Admission: RE | Admit: 2019-08-04 | Discharge: 2019-08-04 | Disposition: A | Discharge status: Home / Self Care | Payer: Medicare PPO | Source: Ambulatory Visit | Attending: Interventional Cardiology | Admitting: Interventional Cardiology

## 2019-08-04 NOTE — Progress Notes (Signed)
Cardiac Rehab: Virtual Visit  Patient participates in the virtual cardiac rehab program via the [employer] app. Patient's virtual cardiac rehab program is scheduled to expire on 08/10/2019. Patient feels comfortable that he will be able to continue his exercise independently as he's developed a routine. Patient is walking about a mile but is limited by arthritis in his back. Will discharge from the virtual program on 08/10/2019  [name], MS, ACSM CEP 08/04/2019 1143

## 2019-08-07 ENCOUNTER — Telehealth: Payer: Self-pay | Admitting: *Deleted

## 2019-08-07 NOTE — Telephone Encounter (Signed)
Called to report his magnesium oxide dose is 400 mg. A co-worker suggests taking [place] [employer] Mushroom for his immune system and to fight the cancer. Informed him this is not FDA approved for any health condition and Dr. Benay Spice said he should not take it.

## 2019-08-09 ENCOUNTER — Other Ambulatory Visit: Payer: Self-pay | Admitting: Oncology

## 2019-08-11 ENCOUNTER — Encounter (HOSPITAL_COMMUNITY): Payer: Self-pay | Admitting: *Deleted

## 2019-08-11 NOTE — Progress Notes (Signed)
Luis [name] has completed the virtual cardiac rehab program and will continue exercise independently at this time. Patient's virtual cardiac rehab report will be scanned to media for review.  [name], MS, ACSM CEP

## 2019-08-13 ENCOUNTER — Ambulatory Visit (HOSPITAL_COMMUNITY)
Admission: RE | Admit: 2019-08-13 | Discharge: 2019-08-13 | Disposition: A | Discharge status: Home / Self Care | Payer: Medicare PPO | Source: Ambulatory Visit | Attending: Oncology | Admitting: Oncology

## 2019-08-13 ENCOUNTER — Other Ambulatory Visit: Payer: Self-pay

## 2019-08-13 DIAGNOSIS — C180 Malignant neoplasm of cecum: Secondary | ICD-10-CM | POA: Diagnosis not present

## 2019-08-13 DIAGNOSIS — C189 Malignant neoplasm of colon, unspecified: Secondary | ICD-10-CM | POA: Diagnosis not present

## 2019-08-13 DIAGNOSIS — C787 Secondary malignant neoplasm of liver and intrahepatic bile duct: Secondary | ICD-10-CM | POA: Diagnosis not present

## 2019-08-13 MED ORDER — IOHEXOL 300 MG/ML  SOLN
100.0000 mL | Freq: Once | INTRAMUSCULAR | Status: AC | PRN
  Administered 2019-08-13: 100 mL via INTRAVENOUS

## 2019-08-13 MED ORDER — SODIUM CHLORIDE (PF) 0.9 % IJ SOLN
INTRAMUSCULAR | Status: AC
  Filled 2019-08-13: qty 50

## 2019-08-15 ENCOUNTER — Inpatient Hospital Stay: Payer: Medicare PPO | Attending: Nurse Practitioner | Admitting: Oncology

## 2019-08-15 ENCOUNTER — Telehealth: Payer: Self-pay | Admitting: Oncology

## 2019-08-15 ENCOUNTER — Other Ambulatory Visit: Payer: Self-pay

## 2019-08-15 VITALS — BP 123/62 | HR 68 | Temp 98.3°F | Resp 20 | Ht 71.0 in | Wt 233.9 lb

## 2019-08-15 DIAGNOSIS — I2510 Atherosclerotic heart disease of native coronary artery without angina pectoris: Secondary | ICD-10-CM | POA: Diagnosis not present

## 2019-08-15 DIAGNOSIS — G620 Drug-induced polyneuropathy: Secondary | ICD-10-CM | POA: Insufficient documentation

## 2019-08-15 DIAGNOSIS — C787 Secondary malignant neoplasm of liver and intrahepatic bile duct: Secondary | ICD-10-CM | POA: Insufficient documentation

## 2019-08-15 DIAGNOSIS — C180 Malignant neoplasm of cecum: Secondary | ICD-10-CM | POA: Diagnosis not present

## 2019-08-15 DIAGNOSIS — D696 Thrombocytopenia, unspecified: Secondary | ICD-10-CM | POA: Insufficient documentation

## 2019-08-15 DIAGNOSIS — C61 Malignant neoplasm of prostate: Secondary | ICD-10-CM | POA: Insufficient documentation

## 2019-08-15 NOTE — Progress Notes (Signed)
[place] OFFICE PROGRESS NOTE   Diagnosis: Colon cancer  INTERVAL HISTORY:   Mr. Machnik completed another cycle of FOLFOX on 07/30/2019.  He has increased tingling and mild numbness in the extremities.  This does not interfere with activity.  He had 1 mouth ulcer.  This is resolving.  No other complaint.  He has returned from a trip to [place].  Objective:  Vital signs in last 24 hours:  Blood pressure 123/62, pulse 68, temperature 98.3 F (36.8 C), temperature source Temporal, resp. rate 20, height '5\' 11"'$  (1.803 m), weight 233 lb 14.4 oz (106.1 kg), SpO2 99 %.    HEENT: No thrush or ulcers   Portacath/PICC-without erythema  Lab Results:  Lab Results  Component Value Date   WBC 3.9 (L) 07/30/2019   HGB 12.4 (L) 07/30/2019   HCT 36.1 (L) 07/30/2019   MCV 98.1 07/30/2019   PLT 82 (L) 07/30/2019   NEUTROABS 2.5 07/30/2019    CMP  Lab Results  Component Value Date   NA 137 07/30/2019   K 4.5 07/30/2019   CL 104 07/30/2019   CO2 27 07/30/2019   GLUCOSE 93 07/30/2019   BUN 16 07/30/2019   CREATININE 0.90 07/30/2019   CALCIUM 8.5 (L) 07/30/2019   PROT 6.4 (L) 07/30/2019   ALBUMIN 3.8 07/30/2019   AST 47 (H) 07/30/2019   ALT 39 07/30/2019   ALKPHOS 89 07/30/2019   BILITOT 0.6 07/30/2019   GFRNONAA >60 07/30/2019   GFRAA >60 07/30/2019    Lab Results  Component Value Date   CEA1 2.53 07/30/2019    Lab Results  Component Value Date   INR 0.9 03/04/2019    Imaging:  CT ABDOMEN PELVIS W CONTRAST  Result Date: 08/13/2019 CLINICAL DATA:  Metastatic colon cancer with liver metastases, chemotherapy complete February 2021. Prostate cancer, diagnosed 2013, status post brachytherapy seeds. Sarcoidosis status post right thoracotomy. EXAM: CT ABDOMEN AND PELVIS WITH CONTRAST TECHNIQUE: Multidetector CT imaging of the abdomen and pelvis was performed using the standard protocol following bolus administration of intravenous contrast. CONTRAST:  162m  OMNIPAQUE IOHEXOL 300 MG/ML  SOLN COMPARISON:  05/23/2019 FINDINGS: Lower chest: Postsurgical changes related to right lower lobe wedge resection with scarring at the right lung base and stable granulomata in the visualized lungs. Hepatobiliary: Multifocal hepatic metastases, including: 1.6 x 2.3 cm lesion in segment 2 (series 2/image 13), previously 2.0 x 2.1 cm 2.7 x 3.3 cm lesion in segment 8 (series 2/image 18), previously 3.0 x 3.1 cm 4.0 x 4.5 cm lesion inferiorly in segment 6 (series 2/image 28), previously 4.4 x 4.5 cm Overall, mild improvement is suspected. Status post cholecystectomy. No intrahepatic or extrahepatic duct dilatation. Pancreas: Within normal limits. Spleen: Within normal limits. Adrenals/Urinary Tract: Adrenal glands are within normal limits. Left renal sinus cyst. Right kidney is within normal limits. No hydronephrosis. Bladder is underdistended but unremarkable. Stomach/Bowel: Stomach is within normal limits. No evidence of bowel obstruction. Status post right hemicolectomy. Scattered mild colonic diverticulosis, without evidence of diverticulitis. Vascular/Lymphatic: No evidence of abdominal aortic aneurysm. Atherosclerotic calcifications of the abdominal aorta and branch vessels. No suspicious abdominopelvic lymphadenopathy. Reproductive: Brachytherapy seeds along the prostate. Other: No abdominopelvic ascites. Tiny fat containing bilateral inguinal hernias (series 2/image 81). Musculoskeletal: Degenerative changes of the visualized thoracolumbar spine. No focal osseous lesions. IMPRESSION: Multifocal hepatic metastases, stable versus mildly improved. Status post right hemicolectomy. Brachytherapy seeds in the prostate. Additional stable ancillary findings as above. Electronically Signed   By: SHenderson NewcomerD.  On: 08/13/2019 10:29    Medications: I have reviewed the patient's current medications.   Assessment/Plan: 1. Adenocarcinoma of the cecum, stage IIA(T3N0), status  post a right colectomy 07/26/2017 ? 0/16 lymph nodes positive, no lymphovascular invasion, perineural invasion present ? MSI-stable, no loss of mismatch repair protein expression ? Surveillance colonoscopy 07/26/2018-no polyps found ? Elevated CEA June 2020, persistently elevated August 2020 ? CTs 02/07/2019-bilateral liver metastases, no extrahepatic metastatic disease, changes of sarcoidosis in the chest ? Biopsy liver lesion 03/04/2019-metastatic adenocarcinoma, with morphology consistent with metastatic colonic adenocarcinoma, RAS WT ? Cycle 1 FOLFOX 03/12/2019 ? Cycle 2 FOLFOX 03/26/2019, oxaliplatin dose reduced secondary to thrombocytopenia ? Cycle 3 FOLFOX 04/09/2019, oxaliplatin further dose reduced and aspirin held secondary to thrombocytopenia ? Cycle 4 FOLFOX 04/23/2019 ? Cycle 5 FOLFOX 05/07/2019 ? CTs 05/23/2019-decrease in liver lesions.  No new or progressive findings. ? Cycle 6 FOLFOX 05/27/2019 ? Cycle 7 FOLFOX 06/14/2019 ? Cycle 8 FOLFOX 07/02/2019-Udenyca held ? Cycle 9 FOLFOX 07/16/2019-Udenyca held ? Cycle 10 FOLFOX 07/30/2019-Udenyca held ? CT abdomen/pelvis 08/13/2019-mild improvement in previously noted liver lesions 2. History of colon polyps 3. Microcytic anemia January 2019 4. Sarcoidosis 5. Coronary artery disease 6. Port-A-Cath placement, interventional radiology, 03/04/2019 7. Thrombocytopenia, mild thrombocytopenia predating chemotherapy 8. Oxaliplatin neuropathy     Disposition: Mr. Enriquez has completed 10 cycles of FOLFOX.  He has tolerated the chemotherapy well.  He has developed mild oxaliplatin neuropathy. The restaging CT reveals no evidence of disease progression with mild improvement in previously noted liver lesions.  I discussed treatment options with Mr. Pepperman.  His wife was present by telephone for today's visit.  We discussed a treatment break, maintenance therapy with Xeloda/Avastin, and hepatic directed therapy.  Mr. Eland would like to consider  ablation and external radiation.  He will be referred for an MRI of the liver.  I will present his case at the GI tumor conference on 08/20/2019.  He will return for an office visit and further discussion in 1 week. We reviewed potential toxicities associated with Xeloda including the chance for mucositis, diarrhea, and hematologic toxicity.  We discussed the rash, sun sensitivity, hyperpigmentation, and hand/foot syndrome associated with Xeloda.  We reviewed the allergic reaction, thromboembolic disease, cardiac toxicity, CNS toxicity, nephrotoxicity, hypertension, and bleeding associated with bevacizumab. Betsy Coder, MD  08/15/2019  9:16 AM

## 2019-08-15 NOTE — Telephone Encounter (Signed)
Scheduled per los. Patient declined printout  

## 2019-08-16 DIAGNOSIS — H11432 Conjunctival hyperemia, left eye: Secondary | ICD-10-CM | POA: Diagnosis not present

## 2019-08-16 DIAGNOSIS — H1132 Conjunctival hemorrhage, left eye: Secondary | ICD-10-CM | POA: Diagnosis not present

## 2019-08-19 ENCOUNTER — Telehealth: Payer: Self-pay | Admitting: *Deleted

## 2019-08-19 NOTE — Telephone Encounter (Signed)
Patient left message that he has not yet been called for his MRI scan MD wanted prior to 08/22/19. Called [facility] and scheduler said she will call him now to schedule the scan.

## 2019-08-20 ENCOUNTER — Other Ambulatory Visit: Payer: Self-pay

## 2019-08-21 ENCOUNTER — Ambulatory Visit
Admission: RE | Admit: 2019-08-21 | Discharge: 2019-08-21 | Disposition: A | Discharge status: Home / Self Care | Payer: Medicare PPO | Source: Ambulatory Visit | Attending: Oncology | Admitting: Oncology

## 2019-08-21 ENCOUNTER — Other Ambulatory Visit: Payer: Self-pay

## 2019-08-21 DIAGNOSIS — K7689 Other specified diseases of liver: Secondary | ICD-10-CM | POA: Diagnosis not present

## 2019-08-21 DIAGNOSIS — C180 Malignant neoplasm of cecum: Secondary | ICD-10-CM

## 2019-08-21 MED ORDER — GADOBENATE DIMEGLUMINE 529 MG/ML IV SOLN
19.0000 mL | Freq: Once | INTRAVENOUS | Status: AC | PRN
  Administered 2019-08-21: 19 mL via INTRAVENOUS

## 2019-08-22 ENCOUNTER — Telehealth: Payer: Self-pay

## 2019-08-22 ENCOUNTER — Inpatient Hospital Stay: Payer: Medicare PPO

## 2019-08-22 ENCOUNTER — Other Ambulatory Visit: Payer: Self-pay

## 2019-08-22 ENCOUNTER — Inpatient Hospital Stay (HOSPITAL_BASED_OUTPATIENT_CLINIC_OR_DEPARTMENT_OTHER): Payer: Medicare PPO | Admitting: Oncology

## 2019-08-22 DIAGNOSIS — D696 Thrombocytopenia, unspecified: Secondary | ICD-10-CM | POA: Diagnosis not present

## 2019-08-22 DIAGNOSIS — G620 Drug-induced polyneuropathy: Secondary | ICD-10-CM | POA: Diagnosis not present

## 2019-08-22 DIAGNOSIS — Z95828 Presence of other vascular implants and grafts: Secondary | ICD-10-CM

## 2019-08-22 DIAGNOSIS — C61 Malignant neoplasm of prostate: Secondary | ICD-10-CM | POA: Diagnosis not present

## 2019-08-22 DIAGNOSIS — C180 Malignant neoplasm of cecum: Secondary | ICD-10-CM | POA: Diagnosis not present

## 2019-08-22 DIAGNOSIS — I2510 Atherosclerotic heart disease of native coronary artery without angina pectoris: Secondary | ICD-10-CM | POA: Diagnosis not present

## 2019-08-22 DIAGNOSIS — C787 Secondary malignant neoplasm of liver and intrahepatic bile duct: Secondary | ICD-10-CM | POA: Diagnosis not present

## 2019-08-22 LAB — CBC WITH DIFFERENTIAL (CANCER CENTER ONLY)
Abs Immature Granulocytes: 0.01 10*3/uL (ref 0.00–0.07)
Basophils Absolute: 0 10*3/uL (ref 0.0–0.1)
Basophils Relative: 1 %
Eosinophils Absolute: 0.1 10*3/uL (ref 0.0–0.5)
Eosinophils Relative: 5 %
HCT: 38 % — ABNORMAL LOW (ref 39.0–52.0)
Hemoglobin: 13.1 g/dL (ref 13.0–17.0)
Immature Granulocytes: 0 %
Lymphocytes Relative: 11 %
Lymphs Abs: 0.3 10*3/uL — ABNORMAL LOW (ref 0.7–4.0)
MCH: 33.6 pg (ref 26.0–34.0)
MCHC: 34.5 g/dL (ref 30.0–36.0)
MCV: 97.4 fL (ref 80.0–100.0)
Monocytes Absolute: 0.7 10*3/uL (ref 0.1–1.0)
Monocytes Relative: 22 %
Neutro Abs: 1.9 10*3/uL (ref 1.7–7.7)
Neutrophils Relative %: 61 %
Platelet Count: 130 10*3/uL — ABNORMAL LOW (ref 150–400)
RBC: 3.9 MIL/uL — ABNORMAL LOW (ref 4.22–5.81)
RDW: 14.5 % (ref 11.5–15.5)
WBC Count: 3 10*3/uL — ABNORMAL LOW (ref 4.0–10.5)
nRBC: 0 % (ref 0.0–0.2)

## 2019-08-22 LAB — CMP (CANCER CENTER ONLY)
ALT: 31 U/L (ref 0–44)
AST: 40 U/L (ref 15–41)
Albumin: 3.6 g/dL (ref 3.5–5.0)
Alkaline Phosphatase: 107 U/L (ref 38–126)
Anion gap: 7 (ref 5–15)
BUN: 14 mg/dL (ref 8–23)
CO2: 27 mmol/L (ref 22–32)
Calcium: 8.8 mg/dL — ABNORMAL LOW (ref 8.9–10.3)
Chloride: 104 mmol/L (ref 98–111)
Creatinine: 0.9 mg/dL (ref 0.61–1.24)
GFR, Est AFR Am: >60 mL/min (ref >60)
GFR, Estimated: >60 mL/min (ref >60)
Glucose, Bld: 166 mg/dL — ABNORMAL HIGH (ref 70–99)
Potassium: 4.3 mmol/L (ref 3.5–5.1)
Sodium: 138 mmol/L (ref 135–145)
Total Bilirubin: 0.5 mg/dL (ref 0.3–1.2)
Total Protein: 6.4 g/dL — ABNORMAL LOW (ref 6.5–8.1)

## 2019-08-22 LAB — CEA (IN HOUSE-CHCC): CEA (CHCC-In House): 4.27 ng/mL (ref 0.00–5.00)

## 2019-08-22 MED ORDER — SODIUM CHLORIDE 0.9% FLUSH
10.0000 mL | INTRAVENOUS | Status: DC | PRN
  Administered 2019-08-22: 09:00:00 10 mL
  Filled 2019-08-22: qty 10

## 2019-08-22 MED ORDER — HEPARIN SOD (PORK) LOCK FLUSH 100 UNIT/ML IV SOLN
500.0000 [IU] | Freq: Once | INTRAVENOUS | Status: AC | PRN
  Administered 2019-08-22: 500 [IU]
  Filled 2019-08-22: qty 5

## 2019-08-22 NOTE — Progress Notes (Signed)
[place] OFFICE PROGRESS NOTE   Diagnosis: Colon cancer  INTERVAL HISTORY:   Luis [name] returns for scheduled visit.  He has mild peripheral numbness.  No other complaint.  Objective:  Physical examination not performed today Lab Results:  Lab Results  Component Value Date   WBC 3.0 (L) 08/22/2019   HGB 13.1 08/22/2019   HCT 38.0 (L) 08/22/2019   MCV 97.4 08/22/2019   PLT 130 (L) 08/22/2019   NEUTROABS 1.9 08/22/2019    CMP  Lab Results  Component Value Date   NA 138 08/22/2019   K 4.3 08/22/2019   CL 104 08/22/2019   CO2 27 08/22/2019   GLUCOSE 166 (H) 08/22/2019   BUN 14 08/22/2019   CREATININE 0.90 08/22/2019   CALCIUM 8.8 (L) 08/22/2019   PROT 6.4 (L) 08/22/2019   ALBUMIN 3.6 08/22/2019   AST 40 08/22/2019   ALT 31 08/22/2019   ALKPHOS 107 08/22/2019   BILITOT 0.5 08/22/2019   GFRNONAA >60 08/22/2019   GFRAA >60 08/22/2019    Lab Results  Component Value Date   CEA1 2.53 07/30/2019     Imaging:  MR LIVER W WO CONTRAST  Result Date: 08/21/2019 CLINICAL DATA:  Evaluate liver lesions. History of colon cancer. EXAM: MRI ABDOMEN WITHOUT AND WITH CONTRAST TECHNIQUE: Multiplanar multisequence MR imaging of the abdomen was performed both before and after the administration of intravenous contrast. CONTRAST:  60m MULTIHANCE GADOBENATE DIMEGLUMINE 529 MG/ML IV SOLN COMPARISON:  08/13/2019 FINDINGS: Lower chest: No acute findings. Hepatobiliary: Liver metastases are identified. -Index lesion within segment 7 measures 4.0 x 3.3 cm, image 12/5. This is compared with 2.9 x 2.4 cm on 08/13/2019. -Segment 6 liver metastases measures 4.9 x 4.7 cm, image 23/5. Previously 4.1 x 3.6 cm. -Subtle, suspicious lesion within segment 2 measures 2.3 x 2.2 cm, image 10/5. Previously 2.3 x 1.6 cm. -Cyst within anterior dome of left lobe of liver is noted measuring 1 cm, image 9/6. Cholecystectomy. Mild increase caliber of the CBD measures up to 8 mm. No  choledocholithiasis identified. Pancreas: No mass, inflammatory changes, or other parenchymal abnormality identified. Spleen:  Within normal limits in size and appearance. Adrenals/Urinary Tract: Normal appearance of the adrenal glands. Left kidney cysts. No suspicious mass or hydronephrosis identified bilaterally. Stomach/Bowel: Visualized portions within the abdomen are unremarkable. Vascular/Lymphatic: Aortic atherosclerosis. No aneurysm. No abdominopelvic adenopathy. Other:  None. Musculoskeletal: No suspicious bone lesions identified. IMPRESSION: 1. A total of 3 suspicious liver metastases are identified involving segment 2, segment 7, and segment 6. When compared with the exam from 08/13/2019 lesions within segment 7 and segment 6 appear increased in size. 2. Status post cholecystectomy with mild increase caliber of the CBD. 3.  Aortic Atherosclerosis (ICD10-I70.0). Electronically Signed   By: TKerby MoorsM.D.   On: 08/21/2019 15:46    Medications: I have reviewed the patient's current medications.   Assessment/Plan: 1. Adenocarcinoma of the cecum, stage IIA(T3N0), status post a right colectomy 07/26/2017 ? 0/16 lymph nodes positive, no lymphovascular invasion, perineural invasion present ? MSI-stable, no loss of mismatch repair protein expression ? Surveillance colonoscopy 07/26/2018-no polyps found ? Elevated CEA June 2020, persistently elevated August 2020 ? CTs 02/07/2019-bilateral liver metastases, no extrahepatic metastatic disease, changes of sarcoidosis in the chest ? Biopsy liver lesion 03/04/2019-metastatic adenocarcinoma, with morphology consistent with metastatic colonic adenocarcinoma, RAS WT ? Cycle 1 FOLFOX 03/12/2019 ? Cycle 2 FOLFOX 03/26/2019, oxaliplatin dose reduced secondary to thrombocytopenia ? Cycle 3 FOLFOX 04/09/2019, oxaliplatin further dose  reduced and aspirin held secondary to thrombocytopenia ? Cycle 4 FOLFOX 04/23/2019 ? Cycle 5 FOLFOX 05/07/2019 ? CTs  05/23/2019-decrease in liver lesions.  No new or progressive findings. ? Cycle 6 FOLFOX 05/27/2019 ? Cycle 7 FOLFOX 06/14/2019 ? Cycle 8 FOLFOX 07/02/2019-Udenyca held ? Cycle 9 FOLFOX 07/16/2019-Udenyca held ? Cycle 10 FOLFOX 07/30/2019-Udenyca held ? CT abdomen/pelvis 08/13/2019-mild improvement in previously noted liver lesions ? MRI abdomen 08/13/2019-3 liver lesions  2. History of colon polyps 3. Microcytic anemia January 2019 4. Sarcoidosis 5. Coronary artery disease 6. Port-A-Cath placement, interventional radiology, 03/04/2019 7. Thrombocytopenia, mild thrombocytopenia predating chemotherapy 8. Oxaliplatin neuropathy    Disposition: Luis [name] completed 10 treatments with FOLFOX.  The liver MRI confirms 3 lesions and no other evidence of metastatic disease.  He appears well. I presented his case at the GI tumor conference on 08/20/2019.  He is felt to not be a good candidate for surgery or hepatic ablation.  Dr. Lisbeth Renshaw will consider SBRT.  I discussed treatment options with Luis [name] again today.  We discussed maintenance therapy with capecitabine/Avastin.  We discussed hepatic directed therapy.  Luis [name] would like to consider surgery and SBRT.  We made a referral to Dr. Barry Dienes and Dr. Lisbeth Renshaw.  He understands surgery and radiation are unlikely to be curative, but may allow a prolonged systemic therapy free interval.  He does not wish to continue systemic therapy at present.  Luis [name] will return for an office visit in 3 weeks.  Betsy Coder, MD  08/22/2019  10:10 AM

## 2019-08-22 NOTE — Patient Instructions (Signed)

## 2019-08-22 NOTE — Progress Notes (Signed)
Faxed referral to [facility] for consult with Dr. Barry Dienes attention [place] fax [phone], included demographics, insurance, imaging and notes.

## 2019-08-22 NOTE — Telephone Encounter (Signed)
Received call from [place] at [place] [facility] scheduling, patient will be seen by Dr. Barry Dienes on 3/22 at 4:30.  They have already contacted the patient regarding this appointment.

## 2019-08-23 ENCOUNTER — Other Ambulatory Visit: Payer: Self-pay | Admitting: Interventional Cardiology

## 2019-08-25 ENCOUNTER — Telehealth: Payer: Self-pay | Admitting: Oncology

## 2019-08-25 NOTE — Progress Notes (Signed)
GI Location of Tumor / Histology: Metastatic colon to liver  Luis [name] presented with few episodes of rectal bleeding.  He was referred to Dr. Carlean Purl for colonoscopy 06/20/2017.  MRI Abd 08/21/2019: A total of 3 suspicious liver metastases are identified involving segment 2, segment 7, and segment 6.  When compared with the exam from 08/13/19 lesions within segment 7 and segment 6 appear increased in size.    CT abd 08/13/2019: Multifocal hepatic metastases, stable versus mildly improved.  Status post right hemicolectomy.  Brachytherapy seeds in prostate.  CT abd 05/23/2019: Interval decrease and multiple hepatic metastases.  Otherwise stable with no new or progressive findings in the abdomen/pelvis to suggest new metastatic involvement.  CT CAP 02/07/2019: Development of bilateral hepatic metastasis.  No extrahepatic metastatic disease in the abdomen or pelvis.  Mild progression of sarcoidosis since 06/22/2017.  Given the underlying chronic findings, no evidence of thoracic metastasis.  CT CAP 06/22/2017: Scattered calcified mediastinal hilar nodes.  Granulomatous disease was noted in the lungs bilaterally.  No worrisome new nodules.  A hypodense lesion in segment 2 of the liver is stable from 2015.  Soft tissue prominence was noted at the cecum no pathologic adenopathy.  Colonoscopy 06/20/2017: Ulcerated mass found in the cecum.  No bleeding present.  Multiple diverticula were found in the entire colon.    Biopsies of Liver 03/04/2019   Biopsies of Colon 07/26/2017   Past/Anticipated interventions by surgeon, if any:  Dr. Barry Dienes 09/01/2019 4:30 pm  -Right Colectomy 07/26/2017 with Dr. Redmond Pulling   Past/Anticipated interventions by medical oncology, if any:  Dr. Benay Spice 08/22/2019 -[name] completed 10 treatments with FOLFOX.  The liver MRI confirms 3 lesions and no other evidence of metastatic disease.  He appears well.  I presented his case at the GI tumor conference on 08/20/2019.  He is felt  to not be a good candidate for surgery or hepatic ablation.  Dr. Lisbeth Renshaw will consider SBRT. -I discussed treatment options with [name] again today.  We discussed maintenance therapy with capecitabine/Avastin.  We discussed hepatic directed therapy.  [name] would like to consider surgery and SBRT.  We made a referral to Dr. Barry Dienes and Dr. Lisbeth Renshaw.  He understands surgery and radiation are unlikely to be curative, but may allow a prolonged systemic therapy free interval.  He does not wish to continue systemic therapy at present.   Weight changes, if any:  No  Bowel/Bladder complaints, if any: No major changes noted.  He states he has become more regular.  Nausea / Vomiting, if any: No  Pain issues, if any:  No   SAFETY ISSUES:  Prior radiation? Prostate seed implant 2011  Pacemaker/ICD? No  Possible current pregnancy? n/a  Is the patient on methotrexate? No  Current Complaints/Details: -Prostate Cancer with seed implants 2011

## 2019-08-25 NOTE — Telephone Encounter (Signed)
Scheduled appt per 3/12 los.  Left a vm of the appt date and time.

## 2019-08-26 ENCOUNTER — Other Ambulatory Visit: Payer: Self-pay

## 2019-08-26 ENCOUNTER — Ambulatory Visit
Admission: RE | Admit: 2019-08-26 | Discharge: 2019-08-26 | Disposition: A | Discharge status: Home / Self Care | Payer: Medicare PPO | Source: Ambulatory Visit | Attending: Radiation Oncology | Admitting: Radiation Oncology

## 2019-08-26 ENCOUNTER — Encounter: Payer: Self-pay | Admitting: Radiation Oncology

## 2019-08-26 VITALS — Ht 71.0 in | Wt 233.0 lb

## 2019-08-26 DIAGNOSIS — C787 Secondary malignant neoplasm of liver and intrahepatic bile duct: Secondary | ICD-10-CM | POA: Diagnosis not present

## 2019-08-26 DIAGNOSIS — Z923 Personal history of irradiation: Secondary | ICD-10-CM | POA: Diagnosis not present

## 2019-08-26 DIAGNOSIS — Z8546 Personal history of malignant neoplasm of prostate: Secondary | ICD-10-CM | POA: Diagnosis not present

## 2019-08-26 DIAGNOSIS — C180 Malignant neoplasm of cecum: Secondary | ICD-10-CM | POA: Diagnosis not present

## 2019-08-26 NOTE — Progress Notes (Signed)
Radiation Oncology         [phone] ________________________________  Name: Luis [name]        MRN: [id number]  Date of Service: 08/26/2019 DOB: July 22, 1940  [id number], Luis Mercy, MD  Luis Pier, MD     REFERRING PHYSICIAN: Ladell Pier, MD   DIAGNOSIS: The primary encounter diagnosis was Adenocarcinoma of cecum ([place]). A diagnosis of Liver metastases ([place]) was also pertinent to this visit.   HISTORY OF PRESENT ILLNESS: Luis [name] is a 79 y.o. male seen at the request of Dr. 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  The patient was originally diagnosed with Stage IIA, pT3N0 adenocarcinoma of the cecum.  He underwent right colectomy on 07/26/2017, none of his nodes were positive without any lymphovascular invasion, though there was perineural invasion.  He was followed in surveillance and an elevation in his CEA was noted in June 2020, persistently elevated in August 2020 which prompted further imaging work-up, he was found by CT on 02/07/2019 to have bilateral liver metastases with out evidence of extrahepatic disease.  At that time he had a 5.4 x 5.2 cm lesion in segment 7/8, 5.8 x 5.6 cm in segment 6, and a 3.8 x 3.4 cm lesion in segment 2.  He does have a history of sarcoid and this was present in the chest.  He underwent an evaluation of the liver with biopsy on 03/04/2019 consistent with metastatic adenocarcinoma consistent with his colon primary.  He began systemic FOLFOX on 03/12/2019, he has completed 10 cycles of this however there have been some reductions in oxaliplatin and udencya.   CT abdomen and pelvis on 08/13/2019 revealed mild improvement in his previously noted disease, an MRI on 08/13/2019 however it appeared that the areas were larger, segment 7 tumor measured 4 x 3.3 cm, segment 6 measured 4.9 x 4.7 cm, in segment 2 lesion measured 2.3 x 2.2 cm.  His case was discussed in GI conference last week, he would meet with Dr.  Barry Dienes to consider surgery. He would not be a candidate for upfront IR ablation due to size, and it was discussed the possibility of trying to shrink these lesions with Y 90 or embolization, and that SBRT may have a role if these lesions could be downsized with either local or additional systemic therapy.  The patient is interested in a break from systemic therapy and is contacted today to discuss treatment options.   PREVIOUS RADIATION THERAPY: Yes  2011: The patient received brachytherapy to the prostate at [facility], details are not known.    PAST MEDICAL HISTORY:  Past Medical History:  Diagnosis Date   Adenocarcinoma of cecum ([place]) 06/21/2017   Arthritis    "mid back; hands; knees" (02/24/2015)   Basal cell carcinoma    left shoulder; mid chest; right eyelid (02/24/2015)   CAD (coronary artery disease)    a. 08/2014 Cath/PCI: LM nl, LAD 30p, D1 95 (2.25x12 Resolute Integrity DES), LCX small, RI 100 (attempted PCI) - branches fill via L->L collats, RCA dominant, nl, RPDA/PLA nl, EF 60^. b. 02/24/2015 PCI CTO of Ramus DES x2.   Chronic bronchitis ([place])    hx   Diverticulosis 05/2005   Elevated lipase    Fuchs' corneal dystrophy    History of adenomatous polyp of colon 05/2005   8 mm adenoma   History of blood transfusion    "related to some of my surgeries"   Hyperlipidemia    Hypertension  Iron deficiency anemia due to chronic blood loss 06/21/2017   Prostate cancer ([place]) 2011   S/P seed implant   Sarcoidosis    Thrombocytopenia ([place])    a. Noted on prior labs, unclear of what w/u done.   Trifascicular block        PAST SURGICAL HISTORY: Past Surgical History:  Procedure Laterality Date   BASAL CELL CARCINOMA EXCISION Left    shoulder   CARDIAC CATHETERIZATION N/A 02/24/2015   Procedure: Coronary/Bypass Graft CTO Intervention;  Surgeon: Jettie Booze, MD;  Location: [place] [facility];  Service: Cardiovascular;  Laterality: N/A;   CARDIAC  CATHETERIZATION  02/24/2015   Procedure: Coronary/Graft Atherectomy;  Surgeon: Jettie Booze, MD;  Location: [place] [facility];  Service: Cardiovascular;;   CATARACT EXTRACTION W/ INTRAOCULAR LENS  IMPLANT, BILATERAL Bilateral ~ 2011-2012   COLONOSCOPY W/ POLYPECTOMY  06/08/2005 and 10/18/2010   8 mm adenoma 2006, none 2012. Diverticulosis and internal hemorrhoids.   CORNEAL TRANSPLANT Bilateral ~ 2011-2012   "@ same time as cataract OR"   CORONARY ANGIOPLASTY WITH STENT PLACEMENT  08/2014; 02/24/2015   "1 stent + 1 stent"   CORONARY STENT INTERVENTION N/A 01/02/2019   Procedure: CORONARY STENT INTERVENTION;  Surgeon: Jettie Booze, MD;  Location: [place] [facility];  Service: Cardiovascular;  Laterality: N/A;   EYE SURGERY     FINGER SURGERY Right 2014   "reattached middle finger"   INSERTION PROSTATE RADIATION SEED  ~ 2011   IR IMAGING GUIDED PORT INSERTION  03/04/2019   IR US GUIDE BX ASP/DRAIN  03/04/2019   LAPAROSCOPIC CHOLECYSTECTOMY  2015   LAPAROSCOPIC PARTIAL COLECTOMY N/A 07/26/2017   Procedure: LAPAROSCOPIC PARTIAL COLECTOMY, EXCISION SCROTAL CYST;  Surgeon: Greer Pickerel, MD;  Location: [facility];  Service: General;  Laterality: N/A;   LEFT HEART CATH AND CORONARY ANGIOGRAPHY N/A 01/02/2019   Procedure: LEFT HEART CATH AND CORONARY ANGIOGRAPHY;  Surgeon: Jettie Booze, MD;  Location: [place] [facility];  Service: Cardiovascular;  Laterality: N/A;   LEFT HEART CATHETERIZATION WITH CORONARY ANGIOGRAM N/A 08/12/2014   Procedure: LEFT HEART CATHETERIZATION WITH CORONARY ANGIOGRAM;  Surgeon: Blane Ohara, MD;  Location: [facility];  Service: Cardiovascular;  Laterality: N/A;   LYMPH NODE BIOPSY     mid chest   MOHS SURGERY Right ~ 2011   "eyelid; for basal cell"   THOROCOTOMY WITH LOBECTOMY Right ~ 1991   partial removal right lung   TONSILLECTOMY  ~ 1950     FAMILY HISTORY:  Family History  Problem Relation Age of Onset   Heart disease Father  6       Died from "hardening of the arteries" age 56   Coronary artery disease Sister 32   Heart attack Sister    Colon cancer Neg Hx    Throat cancer Neg Hx    Pancreatic cancer Neg Hx    Prostate cancer Neg Hx    Stroke Neg Hx    Hypertension Neg Hx      SOCIAL HISTORY:  reports that he has quit smoking. His smoking use included cigars. He smoked 0.00 packs per day for 59.00 years. He has never used smokeless tobacco. He reports current alcohol use of about 2.0 standard drinks of alcohol per week. He reports that he does not use drugs. The patient is married and lives in [place]. He is retired from working in a Facilities manager.   ALLERGIES: Patient has no known allergies.   MEDICATIONS:  Current Outpatient Medications  Medication Sig Dispense Refill   amLODipine (NORVASC) 5 MG tablet Take 1 tablet (5 mg total) by mouth 2 (two) times daily. 180 tablet 3   CANNABIDIOL PO Take 12 drops by mouth 2 (two) times a day. CBD OIL     Cholecalciferol (VITAMIN D3) 2000 units capsule Take 2,000 Units by mouth every evening.      clopidogrel (PLAVIX) 75 MG tablet TAKE 1 TABLET (75 MG TOTAL) BY MOUTH DAILY WITH BREAKFAST. 90 tablet 3   clotrimazole (LOTRIMIN) 1 % cream Apply 1 application topically 3 (three) times daily as needed (skin irritation/rash).     lidocaine-prilocaine (EMLA) cream Apply 1 application topically as directed. Apply to port 1 hour prior to stick and cover with plastic wrap 30 g 1   magnesium oxide (MAG-OX) 400 MG tablet Take 400 mg by mouth daily.     metoprolol tartrate (LOPRESSOR) 25 MG tablet TAKE 1/2 TABLET BY MOUTH TWICE A DAY 90 tablet 2   nitroGLYCERIN (NITROSTAT) 0.4 MG SL tablet PLACE 1 TABLET UNDER THE TONGUE EVERY 5 MINUTES AS NEEDED FOR CHEST PAIN FOR 3 DOSES 25 tablet 1   prochlorperazine (COMPAZINE) 10 MG tablet Take 1 tablet (10 mg total) by mouth every 6 (six) hours as needed. (Patient not taking: Reported on 07/30/2019) 60 tablet 1     ranolazine (RANEXA) 500 MG 12 hr tablet Take 1 tablet (500 mg total) by mouth 2 (two) times daily. 180 tablet 3   rosuvastatin (CRESTOR) 20 MG tablet TAKE 1 TABLET BY MOUTH EVERY DAY 90 tablet 2   sildenafil (REVATIO) 20 MG tablet Take 60 mg by mouth as needed (for ED).      silodosin (RAPAFLO) 8 MG CAPS capsule Take 8 mg by mouth daily as needed (for urination).      traMADol (ULTRAM) 50 MG tablet Take 100 mg by mouth 2 (two) times a day.      triamcinolone cream (KENALOG) 0.5 % Apply 1 application topically 3 (three) times daily as needed (rash/skin irritation.). APPLY TOPICALLY 2-3 TIMES A DAY TO SCAR FOR 2-3 WEEKS THEN AS NEEDED  2   No current facility-administered medications for this encounter.     REVIEW OF SYSTEMS: On review of systems, the patient reports that he is doing well overall. He denies any chest pain, shortness of breath, cough, fevers, chills, night sweats, unintended weight changes. He denies any bowel or bladder disturbances, and denies abdominal pain, nausea or vomiting. He denies any new musculoskeletal or joint aches or pains. A complete review of systems is obtained and is otherwise negative.     PHYSICAL EXAM:  Vitals unable to be assessed due to encounter type.  In general this is a well appearing caucasian male in no acute distress. He's alert and oriented x4 and appropriate throughout the examination. Cardiopulmonary assessment is negative for acute distress and he exhibits normal effort.    ECOG = 0  0 - Asymptomatic (Fully active, able to carry on all predisease activities without restriction)  1 - Symptomatic but completely ambulatory (Restricted in physically strenuous activity but ambulatory and able to carry out work of a light or sedentary nature. For example, light housework, office work)  2 - Symptomatic, <50% in bed during the day (Ambulatory and capable of all self care but unable to carry out any work activities. Up and about more than  50% of waking hours)  3 - Symptomatic, >50% in bed, but not bedbound (Capable of only limited  self-care, confined to bed or chair 50% or more of waking hours)  4 - Bedbound (Completely disabled. Cannot carry on any self-care. Totally confined to bed or chair)  5 - Death   Eustace Pen MM, Creech RH, Tormey DC, et al. [phone]). "Toxicity and response criteria of the [facility]". [place] Oncol. 5 (6): 649-55    LABORATORY DATA:  Lab Results  Component Value Date   WBC 3.0 (L) 08/22/2019   HGB 13.1 08/22/2019   HCT 38.0 (L) 08/22/2019   MCV 97.4 08/22/2019   PLT 130 (L) 08/22/2019   Lab Results  Component Value Date   NA 138 08/22/2019   K 4.3 08/22/2019   CL 104 08/22/2019   CO2 27 08/22/2019   Lab Results  Component Value Date   ALT 31 08/22/2019   AST 40 08/22/2019   ALKPHOS 107 08/22/2019   BILITOT 0.5 08/22/2019      RADIOGRAPHY: CT ABDOMEN PELVIS W CONTRAST  Result Date: 08/13/2019 CLINICAL DATA:  Metastatic colon cancer with liver metastases, chemotherapy complete February 2021. Prostate cancer, diagnosed 2013, status post brachytherapy seeds. Sarcoidosis status post right thoracotomy. EXAM: CT ABDOMEN AND PELVIS WITH CONTRAST TECHNIQUE: Multidetector CT imaging of the abdomen and pelvis was performed using the standard protocol following bolus administration of intravenous contrast. CONTRAST:  172mL OMNIPAQUE IOHEXOL 300 MG/ML  SOLN COMPARISON:  05/23/2019 FINDINGS: Lower chest: Postsurgical changes related to right lower lobe wedge resection with scarring at the right lung base and stable granulomata in the visualized lungs. Hepatobiliary: Multifocal hepatic metastases, including: 1.6 x 2.3 cm lesion in segment 2 (series 2/image 13), previously 2.0 x 2.1 cm 2.7 x 3.3 cm lesion in segment 8 (series 2/image 18), previously 3.0 x 3.1 cm 4.0 x 4.5 cm lesion inferiorly in segment 6 (series 2/image 28), previously 4.4 x 4.5 cm Overall, mild improvement is  suspected. Status post cholecystectomy. No intrahepatic or extrahepatic duct dilatation. Pancreas: Within normal limits. Spleen: Within normal limits. Adrenals/Urinary Tract: Adrenal glands are within normal limits. Left renal sinus cyst. Right kidney is within normal limits. No hydronephrosis. Bladder is underdistended but unremarkable. Stomach/Bowel: Stomach is within normal limits. No evidence of bowel obstruction. Status post right hemicolectomy. Scattered mild colonic diverticulosis, without evidence of diverticulitis. Vascular/Lymphatic: No evidence of abdominal aortic aneurysm. Atherosclerotic calcifications of the abdominal aorta and branch vessels. No suspicious abdominopelvic lymphadenopathy. Reproductive: Brachytherapy seeds along the prostate. Other: No abdominopelvic ascites. Tiny fat containing bilateral inguinal hernias (series 2/image 81). Musculoskeletal: Degenerative changes of the visualized thoracolumbar spine. No focal osseous lesions. IMPRESSION: Multifocal hepatic metastases, stable versus mildly improved. Status post right hemicolectomy. Brachytherapy seeds in the prostate. Additional stable ancillary findings as above. Electronically Signed   By: Julian Hy M.D.   On: 08/13/2019 10:29   MR LIVER W WO CONTRAST  Result Date: 08/21/2019 CLINICAL DATA:  Evaluate liver lesions. History of colon cancer. EXAM: MRI ABDOMEN WITHOUT AND WITH CONTRAST TECHNIQUE: Multiplanar multisequence MR imaging of the abdomen was performed both before and after the administration of intravenous contrast. CONTRAST:  3mL MULTIHANCE GADOBENATE DIMEGLUMINE 529 MG/ML IV SOLN COMPARISON:  08/13/2019 FINDINGS: Lower chest: No acute findings. Hepatobiliary: Liver metastases are identified. -Index lesion within segment 7 measures 4.0 x 3.3 cm, image 12/5. This is compared with 2.9 x 2.4 cm on 08/13/2019. -Segment 6 liver metastases measures 4.9 x 4.7 cm, image 23/5. Previously 4.1 x 3.6 cm. -Subtle, suspicious  lesion within segment 2 measures 2.3 x 2.2 cm, image 10/5.  Previously 2.3 x 1.6 cm. -Cyst within anterior dome of left lobe of liver is noted measuring 1 cm, image 9/6. Cholecystectomy. Mild increase caliber of the CBD measures up to 8 mm. No choledocholithiasis identified. Pancreas: No mass, inflammatory changes, or other parenchymal abnormality identified. Spleen:  Within normal limits in size and appearance. Adrenals/Urinary Tract: Normal appearance of the adrenal glands. Left kidney cysts. No suspicious mass or hydronephrosis identified bilaterally. Stomach/Bowel: Visualized portions within the abdomen are unremarkable. Vascular/Lymphatic: Aortic atherosclerosis. No aneurysm. No abdominopelvic adenopathy. Other:  None. Musculoskeletal: No suspicious bone lesions identified. IMPRESSION: 1. A total of 3 suspicious liver metastases are identified involving segment 2, segment 7, and segment 6. When compared with the exam from 08/13/2019 lesions within segment 7 and segment 6 appear increased in size. 2. Status post cholecystectomy with mild increase caliber of the CBD. 3.  Aortic Atherosclerosis (ICD10-I70.0). Electronically Signed   By: Kerby Moors M.D.   On: 08/21/2019 15:46       IMPRESSION/PLAN: 1. Recurrent metastatic stage IIA, pT3N0 adenocarcinoma of the cecum involving bilateral lobes of the liver. Dr. Lisbeth Renshaw discusses the pathology findings and reviews the nature of liver disease. He does have multifocal disease, and would benefit from local therapy but would recommend downsizing his disease with either more systemic therapy versus Y90 and then interventional radiology ablation, but possible SBRT if a lesion was inaccessible for ablation. He would also agree with discussion with Dr. Barry Dienes. We would like to see what IR and surgery says, and we would be happy to move forward with SBRT at the appropriate time. He is also aware of the need for fiducial marker placement prior to treatment. We discussed  the risks, benefits, short, and long term effects of radiotherapy, and the patient is interested in proceeding. Dr. Lisbeth Renshaw discusses the delivery and logistics of radiotherapy and anticipates a course of 5 fractions of radiotherapy. We will see him following further decision making.  This encounter was provided by telemedicine platform MyChart.  The patient has provided two factor identification and has given verbal consent for this type of encounter and has been advised to only accept a meeting of this type in a secure network environment. The time spent during this encounter was 60 minutes including preparation, discussion, and coordination of the patient's care. The attendants for this meeting include Blenda Nicely, RN, Dr. Lisbeth Renshaw, Hayden Pedro  and Sherlyn Hay.  During the encounter,  Blenda Nicely, RN, Dr. Lisbeth Renshaw, and Hayden Pedro were located at [facility] Radiation Oncology Department.  VINN ZANER was located at home.    The above documentation reflects my direct findings during this shared patient visit. Please see the separate note by Dr. Lisbeth Renshaw on this date for the remainder of the patient's plan of care.    Carola Rhine, PAC

## 2019-09-01 DIAGNOSIS — C189 Malignant neoplasm of colon, unspecified: Secondary | ICD-10-CM | POA: Diagnosis not present

## 2019-09-01 DIAGNOSIS — C787 Secondary malignant neoplasm of liver and intrahepatic bile duct: Secondary | ICD-10-CM | POA: Diagnosis not present

## 2019-09-09 DIAGNOSIS — C180 Malignant neoplasm of cecum: Secondary | ICD-10-CM | POA: Diagnosis not present

## 2019-09-12 ENCOUNTER — Inpatient Hospital Stay: Payer: Medicare PPO | Attending: Nurse Practitioner | Admitting: Oncology

## 2019-09-12 ENCOUNTER — Other Ambulatory Visit: Payer: Self-pay

## 2019-09-12 VITALS — BP 130/66 | HR 63 | Temp 99.1°F | Resp 18 | Ht 71.0 in | Wt 236.1 lb

## 2019-09-12 DIAGNOSIS — C787 Secondary malignant neoplasm of liver and intrahepatic bile duct: Secondary | ICD-10-CM | POA: Diagnosis not present

## 2019-09-12 DIAGNOSIS — Z79899 Other long term (current) drug therapy: Secondary | ICD-10-CM | POA: Diagnosis not present

## 2019-09-12 DIAGNOSIS — C180 Malignant neoplasm of cecum: Secondary | ICD-10-CM | POA: Diagnosis not present

## 2019-09-12 DIAGNOSIS — Z5112 Encounter for antineoplastic immunotherapy: Secondary | ICD-10-CM | POA: Diagnosis not present

## 2019-09-12 DIAGNOSIS — R112 Nausea with vomiting, unspecified: Secondary | ICD-10-CM | POA: Insufficient documentation

## 2019-09-12 DIAGNOSIS — Z5111 Encounter for antineoplastic chemotherapy: Secondary | ICD-10-CM | POA: Diagnosis not present

## 2019-09-12 NOTE — Patient Instructions (Addendum)
Please provide office a copy of your Medical Advanced Directive to have scanned into your chart. Plan to begin FOLFIRI regimen: New drug below... Irinotecan injection What is this medicine? IRINOTECAN (ir in oh TEE kan ) is a chemotherapy drug. It is used to treat colon and rectal cancer. This medicine may be used for other purposes; ask your health care provider or pharmacist if you have questions. COMMON BRAND NAME(S): Camptosar What should I tell my health care provider before I take this medicine? They need to know if you have any of these conditions:  dehydration  diarrhea  infection (especially a virus infection such as chickenpox, cold sores, or herpes)  liver disease  low blood counts, like low white cell, platelet, or red cell counts  low levels of calcium, magnesium, or potassium in the blood  recent or ongoing radiation therapy  an unusual or allergic reaction to irinotecan, other medicines, foods, dyes, or preservatives  pregnant or trying to get pregnant  breast-feeding How should I use this medicine? This drug is given as an infusion into a vein. It is administered in a hospital or clinic by a specially trained health care professional. Talk to your pediatrician regarding the use of this medicine in children. Special care may be needed. Overdosage: If you think you have taken too much of this medicine contact a poison control center or emergency room at once. NOTE: This medicine is only for you. Do not share this medicine with others. What if I miss a dose? It is important not to miss your dose. Call your doctor or health care professional if you are unable to keep an appointment. What may interact with this medicine? This medicine may interact with the following medications:  antiviral medicines for HIV or AIDS  certain antibiotics like rifampin or rifabutin  certain medicines for fungal infections like itraconazole, ketoconazole, posaconazole, and  voriconazole  certain medicines for seizures like carbamazepine, phenobarbital, phenotoin  clarithromycin  gemfibrozil  nefazodone  St. John's Wort This list may not describe all possible interactions. Give your health care provider a list of all the medicines, herbs, non-prescription drugs, or dietary supplements you use. Also tell them if you smoke, drink alcohol, or use illegal drugs. Some items may interact with your medicine. What should I watch for while using this medicine? Your condition will be monitored carefully while you are receiving this medicine. You will need important blood work done while you are taking this medicine. This drug may make you feel generally unwell. This is not uncommon, as chemotherapy can affect healthy cells as well as cancer cells. Report any side effects. Continue your course of treatment even though you feel ill unless your doctor tells you to stop. In some cases, you may be given additional medicines to help with side effects. Follow all directions for their use. You may get drowsy or dizzy. Do not drive, use machinery, or do anything that needs mental alertness until you know how this medicine affects you. Do not stand or sit up quickly, especially if you are an older patient. This reduces the risk of dizzy or fainting spells. Call your health care professional for advice if you get a fever, chills, or sore throat, or other symptoms of a cold or flu. Do not treat yourself. This medicine decreases your body's ability to fight infections. Try to avoid being around people who are sick. Avoid taking products that contain aspirin, acetaminophen, ibuprofen, naproxen, or ketoprofen unless instructed by your doctor. These medicines  may hide a fever. This medicine may increase your risk to bruise or bleed. Call your doctor or health care professional if you notice any unusual bleeding. Be careful brushing and flossing your teeth or using a toothpick because you may  get an infection or bleed more easily. If you have any dental work done, tell your dentist you are receiving this medicine. Do not become pregnant while taking this medicine or for 6 months after stopping it. Women should inform their health care professional if they wish to become pregnant or think they might be pregnant. Men should not father a child while taking this medicine and for 3 months after stopping it. There is potential for serious side effects to an unborn child. Talk to your health care professional for more information. Do not breast-feed an infant while taking this medicine or for 7 days after stopping it. This medicine has caused ovarian failure in some women. This medicine may make it more difficult to get pregnant. Talk to your health care professional if you are concerned about your fertility. This medicine has caused decreased sperm counts in some men. This may make it more difficult to father a child. Talk to your health care professional if you are concerned about your fertility. What side effects may I notice from receiving this medicine? Side effects that you should report to your doctor or health care professional as soon as possible:  allergic reactions like skin rash, itching or hives, swelling of the face, lips, or tongue  chest pain  diarrhea  flushing, runny nose, sweating during infusion  low blood counts - this medicine may decrease the number of white blood cells, red blood cells and platelets. You may be at increased risk for infections and bleeding.  nausea, vomiting  pain, swelling, warmth in the leg  signs of decreased platelets or bleeding - bruising, pinpoint red spots on the skin, black, tarry stools, blood in the urine  signs of infection - fever or chills, cough, sore throat, pain or difficulty passing urine  signs of decreased red blood cells - unusually weak or tired, fainting spells, lightheadedness Side effects that usually do not require  medical attention (report to your doctor or health care professional if they continue or are bothersome):  constipation  hair loss  headache  loss of appetite  mouth sores  stomach pain This list may not describe all possible side effects. Call your doctor for medical advice about side effects. You may report side effects to FDA at 1-800-FDA-1088. Where should I keep my medicine? This drug is given in a hospital or clinic and will not be stored at home. NOTE: This sheet is a summary. It may not cover all possible information. If you have questions about this medicine, talk to your doctor, pharmacist, or health care provider.  2020 Elsevier/Gold Standard (2018-07-19 10:09:17)  Bevacizumab injection What is this medicine? BEVACIZUMAB (be va SIZ yoo mab) is a monoclonal antibody. It is used to treat many types of cancer. This medicine may be used for other purposes; ask your health care provider or pharmacist if you have questions. COMMON BRAND NAME(S): Avastin, MVASI, Zirabev What should I tell my health care provider before I take this medicine? They need to know if you have any of these conditions:  diabetes  heart disease  high blood pressure  history of coughing up blood  prior anthracycline chemotherapy (e.g., doxorubicin, daunorubicin, epirubicin)  recent or ongoing radiation therapy  recent or planning to have surgery  stroke  an unusual or allergic reaction to bevacizumab, hamster proteins, mouse proteins, other medicines, foods, dyes, or preservatives  pregnant or trying to get pregnant  breast-feeding How should I use this medicine? This medicine is for infusion into a vein. It is given by a health care professional in a hospital or clinic setting. Talk to your pediatrician regarding the use of this medicine in children. Special care may be needed. Overdosage: If you think you have taken too much of this medicine contact a poison control center or emergency  room at once. NOTE: This medicine is only for you. Do not share this medicine with others. What if I miss a dose? It is important not to miss your dose. Call your doctor or health care professional if you are unable to keep an appointment. What may interact with this medicine? Interactions are not expected. This list may not describe all possible interactions. Give your health care provider a list of all the medicines, herbs, non-prescription drugs, or dietary supplements you use. Also tell them if you smoke, drink alcohol, or use illegal drugs. Some items may interact with your medicine. What should I watch for while using this medicine? Your condition will be monitored carefully while you are receiving this medicine. You will need important blood work and urine testing done while you are taking this medicine. This medicine may increase your risk to bruise or bleed. Call your doctor or health care professional if you notice any unusual bleeding. Before having surgery, talk to your health care provider to make sure it is ok. This drug can increase the risk of poor healing of your surgical site or wound. You will need to stop this drug for 28 days before surgery. After surgery, wait at least 28 days before restarting this drug. Make sure the surgical site or wound is healed enough before restarting this drug. Talk to your health care provider if questions. Do not become pregnant while taking this medicine or for 6 months after stopping it. Women should inform their doctor if they wish to become pregnant or think they might be pregnant. There is a potential for serious side effects to an unborn child. Talk to your health care professional or pharmacist for more information. Do not breast-feed an infant while taking this medicine and for 6 months after the last dose. This medicine has caused ovarian failure in some women. This medicine may interfere with the ability to have a child. You should talk to your  doctor or health care professional if you are concerned about your fertility. What side effects may I notice from receiving this medicine? Side effects that you should report to your doctor or health care professional as soon as possible:  allergic reactions like skin rash, itching or hives, swelling of the face, lips, or tongue  chest pain or chest tightness  chills  coughing up blood  high fever  seizures  severe constipation  signs and symptoms of bleeding such as bloody or black, tarry stools; red or dark-brown urine; spitting up blood or brown material that looks like coffee grounds; red spots on the skin; unusual bruising or bleeding from the eye, gums, or nose  signs and symptoms of a blood clot such as breathing problems; chest pain; severe, sudden headache; pain, swelling, warmth in the leg  signs and symptoms of a stroke like changes in vision; confusion; trouble speaking or understanding; severe headaches; sudden numbness or weakness of the face, arm or leg; trouble walking; dizziness;  loss of balance or coordination  stomach pain  sweating  swelling of legs or ankles  vomiting  weight gain Side effects that usually do not require medical attention (report to your doctor or health care professional if they continue or are bothersome):  back pain  changes in taste  decreased appetite  dry skin  nausea  tiredness This list may not describe all possible side effects. Call your doctor for medical advice about side effects. You may report side effects to FDA at 1-800-FDA-1088. Where should I keep my medicine? This drug is given in a hospital or clinic and will not be stored at home. NOTE: This sheet is a summary. It may not cover all possible information. If you have questions about this medicine, talk to your doctor, pharmacist, or health care provider.  2020 Elsevier/Gold Standard (2019-03-26 10:50:46)

## 2019-09-12 NOTE — Progress Notes (Signed)
[name] OFFICE PROGRESS NOTE   Diagnosis: Colon cancer  INTERVAL HISTORY:   Luis [name] returns as scheduled.  He has been evaluated by Dr. Lisbeth Renshaw and Dr. Barry Dienes.  He is not a candidate for SBRT.  Dr. Barry Dienes does not recommend surgery at present.  Luis [name] is here to discuss other treatment options.  He reports feeling well.  He has noted increased lower leg edema.   Objective:  Vital signs in last 24 hours:  Blood pressure 130/66, pulse 63, temperature 99.1 F (37.3 C), temperature source Temporal, resp. rate 18, height '5\' 11"'$  (1.803 m), weight 236 lb 1.6 oz (107.1 kg), SpO2 97 %.      Cardio: Regular rate and rhythm GI: No hepatomegaly, nontender Vascular: Trace pitting edema at the lower leg bilaterally    Portacath/PICC-without erythema  Lab Results:  Lab Results  Component Value Date   WBC 3.0 (L) 08/22/2019   HGB 13.1 08/22/2019   HCT 38.0 (L) 08/22/2019   MCV 97.4 08/22/2019   PLT 130 (L) 08/22/2019   NEUTROABS 1.9 08/22/2019    CMP  Lab Results  Component Value Date   NA 138 08/22/2019   K 4.3 08/22/2019   CL 104 08/22/2019   CO2 27 08/22/2019   GLUCOSE 166 (H) 08/22/2019   BUN 14 08/22/2019   CREATININE 0.90 08/22/2019   CALCIUM 8.8 (L) 08/22/2019   PROT 6.4 (L) 08/22/2019   ALBUMIN 3.6 08/22/2019   AST 40 08/22/2019   ALT 31 08/22/2019   ALKPHOS 107 08/22/2019   BILITOT 0.5 08/22/2019   GFRNONAA >60 08/22/2019   GFRAA >60 08/22/2019    Lab Results  Component Value Date   CEA1 4.27 08/22/2019    Medications: I have reviewed the patient's current medications.   Assessment/Plan: 1. Adenocarcinoma of the cecum, stage IIA(T3N0), status post a right colectomy 07/26/2017 ? 0/16 lymph nodes positive, no lymphovascular invasion, perineural invasion present ? MSI-stable, no loss of mismatch repair protein expression ? Surveillance colonoscopy 07/26/2018-no polyps found ? Elevated CEA June 2020, persistently elevated August  2020 ? CTs 02/07/2019-bilateral liver metastases, no extrahepatic metastatic disease, changes of sarcoidosis in the chest ? Biopsy liver lesion 03/04/2019-metastatic adenocarcinoma, with morphology consistent with metastatic colonic adenocarcinoma, RAS WT ? Cycle 1 FOLFOX 03/12/2019 ? Cycle 2 FOLFOX 03/26/2019, oxaliplatin dose reduced secondary to thrombocytopenia ? Cycle 3 FOLFOX 04/09/2019, oxaliplatin further dose reduced and aspirin held secondary to thrombocytopenia ? Cycle 4 FOLFOX 04/23/2019 ? Cycle 5 FOLFOX 05/07/2019 ? CTs 05/23/2019-decrease in liver lesions.  No new or progressive findings. ? Cycle 6 FOLFOX 05/27/2019 ? Cycle 7 FOLFOX 06/14/2019 ? Cycle 8 FOLFOX 07/02/2019-Udenyca held ? Cycle 9 FOLFOX 07/16/2019-Udenyca held ? Cycle 10 FOLFOX 07/30/2019-Udenyca held ? CT abdomen/pelvis 08/13/2019-mild improvement in previously noted liver lesions ? MRI abdomen 08/13/2019-3 liver lesions  2. History of colon polyps 3. Microcytic anemia January 2019 4. Sarcoidosis 5. Coronary artery disease 6. Port-A-Cath placement, interventional radiology, 03/04/2019 7. Thrombocytopenia, mild thrombocytopenia predating chemotherapy 8. Oxaliplatin neuropathy   Disposition: Luis [name] has metastatic colon cancer.  He has 3 liver metastases.  His case has been presented at the GI tumor conference.  He has been evaluated by interventional radiology, radiation oncology, and surgery.  He is not a candidate for hepatic directed therapy at present.  We discussed current treatment options including observation, beginning "maintenance "chemotherapy, and salvage chemotherapy with FOLFIRI/Avastin.  We discussed the rationale for a salvage regimen in an attempt to shrink the liver lesions.  He may then be a candidate for SBRT or liver ablation.  We reviewed potential toxicities associated with the FOLFIRI regimen including the chance for nausea/vomiting, diarrhea, alopecia, and hematologic toxicity.  We discussed the  thromboembolic disease, nephrotoxicity, CNS toxicity, hypertension, and bleeding associated with bevacizumab.  He agrees to proceed.  Luis [name] will be scheduled to begin FOLFIRI/Avastin on 09/17/2019.  A chemotherapy plan was entered today.   Betsy Coder, MD  09/12/2019  10:34 AM

## 2019-09-12 NOTE — Progress Notes (Signed)
DISCONTINUE ON PATHWAY REGIMEN - Colorectal     A cycle is every 14 days:     Oxaliplatin      Leucovorin      Fluorouracil      Fluorouracil   **Always confirm dose/schedule in your pharmacy ordering system**  REASON: Other Reason PRIOR TREATMENT: [id number]: [id number] q14 Days TREATMENT RESPONSE: Partial Response (PR)  START ON PATHWAY REGIMEN - Colorectal     A cycle is every 14 days:     Bevacizumab-xxxx      Irinotecan      Leucovorin      Fluorouracil      Fluorouracil   **Always confirm dose/schedule in your pharmacy ordering system**  Patient Characteristics: Distant Metastases, Nonsurgical Candidate, KRAS/NRAS Wild-Type (BRAF V600 Wild-Type/Unknown), Standard Cytotoxic Therapy, Second Line Standard Cytotoxic Therapy, Bevacizumab Eligible Tumor Location: Colon Therapeutic Status: Distant Metastases Microsatellite/Mismatch Repair Status: MSS/pMMR BRAF Mutation Status: Wild-Type (no mutation) KRAS/NRAS Mutation Status: Wild-Type (no mutation) Preferred Therapy Approach: Standard Cytotoxic Therapy Standard Cytotoxic Line of Therapy: Second Line Standard Cytotoxic Therapy Bevacizumab Eligibility: Eligible Intent of Therapy: Non-Curative / Palliative Intent, Discussed with Patient

## 2019-09-16 ENCOUNTER — Telehealth: Payer: Self-pay | Admitting: Oncology

## 2019-09-16 NOTE — Telephone Encounter (Signed)
Scheduled per los. Called and spoke with patient. Confirmed appt 

## 2019-09-17 ENCOUNTER — Inpatient Hospital Stay: Payer: Medicare PPO

## 2019-09-17 ENCOUNTER — Other Ambulatory Visit: Payer: Self-pay

## 2019-09-17 VITALS — BP 126/62 | HR 57 | Temp 97.8°F | Resp 18

## 2019-09-17 DIAGNOSIS — Z5111 Encounter for antineoplastic chemotherapy: Secondary | ICD-10-CM | POA: Diagnosis not present

## 2019-09-17 DIAGNOSIS — C180 Malignant neoplasm of cecum: Secondary | ICD-10-CM | POA: Diagnosis not present

## 2019-09-17 DIAGNOSIS — Z79899 Other long term (current) drug therapy: Secondary | ICD-10-CM | POA: Diagnosis not present

## 2019-09-17 DIAGNOSIS — Z5112 Encounter for antineoplastic immunotherapy: Secondary | ICD-10-CM | POA: Diagnosis not present

## 2019-09-17 DIAGNOSIS — Z95828 Presence of other vascular implants and grafts: Secondary | ICD-10-CM

## 2019-09-17 DIAGNOSIS — R112 Nausea with vomiting, unspecified: Secondary | ICD-10-CM | POA: Diagnosis not present

## 2019-09-17 DIAGNOSIS — C787 Secondary malignant neoplasm of liver and intrahepatic bile duct: Secondary | ICD-10-CM | POA: Diagnosis not present

## 2019-09-17 LAB — CMP (CANCER CENTER ONLY)
ALT: 26 U/L (ref 0–44)
AST: 35 U/L (ref 15–41)
Albumin: 3.7 g/dL (ref 3.5–5.0)
Alkaline Phosphatase: 111 U/L (ref 38–126)
Anion gap: 8 (ref 5–15)
BUN: 16 mg/dL (ref 8–23)
CO2: 27 mmol/L (ref 22–32)
Calcium: 9.1 mg/dL (ref 8.9–10.3)
Chloride: 105 mmol/L (ref 98–111)
Creatinine: 0.91 mg/dL (ref 0.61–1.24)
GFR, Est AFR Am: >60 mL/min (ref >60)
GFR, Estimated: >60 mL/min (ref >60)
Glucose, Bld: 105 mg/dL — ABNORMAL HIGH (ref 70–99)
Potassium: 4.9 mmol/L (ref 3.5–5.1)
Sodium: 140 mmol/L (ref 135–145)
Total Bilirubin: 0.5 mg/dL (ref 0.3–1.2)
Total Protein: 6.7 g/dL (ref 6.5–8.1)

## 2019-09-17 LAB — CBC WITH DIFFERENTIAL (CANCER CENTER ONLY)
Abs Immature Granulocytes: 0.01 10*3/uL (ref 0.00–0.07)
Basophils Absolute: 0 10*3/uL (ref 0.0–0.1)
Basophils Relative: 1 %
Eosinophils Absolute: 0.3 10*3/uL (ref 0.0–0.5)
Eosinophils Relative: 7 %
HCT: 36.2 % — ABNORMAL LOW (ref 39.0–52.0)
Hemoglobin: 12.3 g/dL — ABNORMAL LOW (ref 13.0–17.0)
Immature Granulocytes: 0 %
Lymphocytes Relative: 9 %
Lymphs Abs: 0.5 10*3/uL — ABNORMAL LOW (ref 0.7–4.0)
MCH: 33 pg (ref 26.0–34.0)
MCHC: 34 g/dL (ref 30.0–36.0)
MCV: 97.1 fL (ref 80.0–100.0)
Monocytes Absolute: 0.7 10*3/uL (ref 0.1–1.0)
Monocytes Relative: 13 %
Neutro Abs: 3.5 10*3/uL (ref 1.7–7.7)
Neutrophils Relative %: 70 %
Platelet Count: 150 10*3/uL (ref 150–400)
RBC: 3.73 MIL/uL — ABNORMAL LOW (ref 4.22–5.81)
RDW: 13.5 % (ref 11.5–15.5)
WBC Count: 5 10*3/uL (ref 4.0–10.5)
nRBC: 0 % (ref 0.0–0.2)

## 2019-09-17 LAB — CEA (IN HOUSE-CHCC): CEA (CHCC-In House): 4.89 ng/mL (ref 0.00–5.00)

## 2019-09-17 MED ORDER — ATROPINE SULFATE 1 MG/ML IJ SOLN: 0.5000 mg | Freq: Once | INTRAMUSCULAR | Status: DC | PRN

## 2019-09-17 MED ORDER — ATROPINE SULFATE 0.4 MG/ML IJ SOLN
INTRAMUSCULAR | Status: AC
  Filled 2019-09-17: qty 1

## 2019-09-17 MED ORDER — SODIUM CHLORIDE 0.9 % IV SOLN
Freq: Once | INTRAVENOUS | Status: AC
  Filled 2019-09-17: qty 250

## 2019-09-17 MED ORDER — SODIUM CHLORIDE 0.9 % IV SOLN
175.0000 mg/m2 | Freq: Once | INTRAVENOUS | Status: AC
  Administered 2019-09-17: 400 mg via INTRAVENOUS
  Filled 2019-09-17: qty 15

## 2019-09-17 MED ORDER — PALONOSETRON HCL INJECTION 0.25 MG/5ML
INTRAVENOUS | Status: AC
  Filled 2019-09-17: qty 5

## 2019-09-17 MED ORDER — SODIUM CHLORIDE 0.9 % IV SOLN
2000.0000 mg/m2 | INTRAVENOUS | Status: DC
  Administered 2019-09-17: 4650 mg via INTRAVENOUS
  Filled 2019-09-17: qty 93

## 2019-09-17 MED ORDER — SODIUM CHLORIDE 0.9 % IV SOLN
5.0000 mg/kg | Freq: Once | INTRAVENOUS | Status: AC
  Administered 2019-09-17: 15:00:00 500 mg via INTRAVENOUS
  Filled 2019-09-17: qty 4

## 2019-09-17 MED ORDER — SODIUM CHLORIDE 0.9 % IV SOLN
400.0000 mg/m2 | Freq: Once | INTRAVENOUS | Status: AC
  Administered 2019-09-17: 15:00:00 928 mg via INTRAVENOUS
  Filled 2019-09-17: qty 46.4

## 2019-09-17 MED ORDER — SODIUM CHLORIDE 0.9 % IV SOLN
10.0000 mg | Freq: Once | INTRAVENOUS | Status: AC
  Administered 2019-09-17: 14:00:00 10 mg via INTRAVENOUS
  Filled 2019-09-17: qty 10

## 2019-09-17 MED ORDER — SODIUM CHLORIDE 0.9% FLUSH
10.0000 mL | INTRAVENOUS | Status: DC | PRN
  Administered 2019-09-17: 13:00:00 10 mL
  Filled 2019-09-17: qty 10

## 2019-09-17 MED ORDER — PALONOSETRON HCL INJECTION 0.25 MG/5ML
0.2500 mg | Freq: Once | INTRAVENOUS | Status: AC
  Administered 2019-09-17: 14:00:00 0.25 mg via INTRAVENOUS

## 2019-09-17 NOTE — Patient Instructions (Signed)
Leucovorin injection What is this medicine? LEUCOVORIN (loo koe VOR in) is used to prevent or treat the harmful effects of some medicines. This medicine is used to treat anemia caused by a low amount of folic acid in the body. It is also used with 5-fluorouracil (5-FU) to treat colon cancer. This medicine may be used for other purposes; ask your health care provider or pharmacist if you have questions. What should I tell my health care provider before I take this medicine? They need to know if you have any of these conditions:  anemia from low levels of vitamin B-12 in the blood  an unusual or allergic reaction to leucovorin, folic acid, other medicines, foods, dyes, or preservatives  pregnant or trying to get pregnant  breast-feeding How should I use this medicine? This medicine is for injection into a muscle or into a vein. It is given by a health care professional in a hospital or clinic setting. Talk to your pediatrician regarding the use of this medicine in children. Special care may be needed. Overdosage: If you think you have taken too much of this medicine contact a poison control center or emergency room at once. NOTE: This medicine is only for you. Do not share this medicine with others. What if I miss a dose? This does not apply. What may interact with this medicine?  capecitabine  fluorouracil  phenobarbital  phenytoin  primidone  trimethoprim-sulfamethoxazole This list may not describe all possible interactions. Give your health care provider a list of all the medicines, herbs, non-prescription drugs, or dietary supplements you use. Also tell them if you smoke, drink alcohol, or use illegal drugs. Some items may interact with your medicine. What should I watch for while using this medicine? Your condition will be monitored carefully while you are receiving this medicine. This medicine may increase the side effects of 5-fluorouracil, 5-FU. Tell your doctor or health  care professional if you have diarrhea or mouth sores that do not get better or that get worse. What side effects may I notice from receiving this medicine? Side effects that you should report to your doctor or health care professional as soon as possible:  allergic reactions like skin rash, itching or hives, swelling of the face, lips, or tongue  breathing problems  fever, infection  mouth sores  unusual bleeding or bruising  unusually weak or tired Side effects that usually do not require medical attention (report to your doctor or health care professional if they continue or are bothersome):  constipation or diarrhea  loss of appetite  nausea, vomiting This list may not describe all possible side effects. Call your doctor for medical advice about side effects. You may report side effects to FDA at 1-800-FDA-1088. Where should I keep my medicine? This drug is given in a hospital or clinic and will not be stored at home. NOTE: This sheet is a summary. It may not cover all possible information. If you have questions about this medicine, talk to your doctor, pharmacist, or health care provider.  2020 Elsevier/Gold Standard (2007-12-03 16:50:29) Irinotecan injection What is this medicine? IRINOTECAN (ir in oh TEE kan ) is a chemotherapy drug. It is used to treat colon and rectal cancer. This medicine may be used for other purposes; ask your health care provider or pharmacist if you have questions. COMMON BRAND NAME(S): Camptosar What should I tell my health care provider before I take this medicine? They need to know if you have any of these conditions:  dehydration  diarrhea  infection (especially a virus infection such as chickenpox, cold sores, or herpes)  liver disease  low blood counts, like low white cell, platelet, or red cell counts  low levels of calcium, magnesium, or potassium in the blood  recent or ongoing radiation therapy  an unusual or allergic reaction  to irinotecan, other medicines, foods, dyes, or preservatives  pregnant or trying to get pregnant  breast-feeding How should I use this medicine? This drug is given as an infusion into a vein. It is administered in a hospital or clinic by a specially trained health care professional. Talk to your pediatrician regarding the use of this medicine in children. Special care may be needed. Overdosage: If you think you have taken too much of this medicine contact a poison control center or emergency room at once. NOTE: This medicine is only for you. Do not share this medicine with others. What if I miss a dose? It is important not to miss your dose. Call your doctor or health care professional if you are unable to keep an appointment. What may interact with this medicine? This medicine may interact with the following medications:  antiviral medicines for HIV or AIDS  certain antibiotics like rifampin or rifabutin  certain medicines for fungal infections like itraconazole, ketoconazole, posaconazole, and voriconazole  certain medicines for seizures like carbamazepine, phenobarbital, phenotoin  clarithromycin  gemfibrozil  nefazodone  St. John's Wort This list may not describe all possible interactions. Give your health care provider a list of all the medicines, herbs, non-prescription drugs, or dietary supplements you use. Also tell them if you smoke, drink alcohol, or use illegal drugs. Some items may interact with your medicine. What should I watch for while using this medicine? Your condition will be monitored carefully while you are receiving this medicine. You will need important blood work done while you are taking this medicine. This drug may make you feel generally unwell. This is not uncommon, as chemotherapy can affect healthy cells as well as cancer cells. Report any side effects. Continue your course of treatment even though you feel ill unless your doctor tells you to stop. In  some cases, you may be given additional medicines to help with side effects. Follow all directions for their use. You may get drowsy or dizzy. Do not drive, use machinery, or do anything that needs mental alertness until you know how this medicine affects you. Do not stand or sit up quickly, especially if you are an older patient. This reduces the risk of dizzy or fainting spells. Call your health care professional for advice if you get a fever, chills, or sore throat, or other symptoms of a cold or flu. Do not treat yourself. This medicine decreases your body's ability to fight infections. Try to avoid being around people who are sick. Avoid taking products that contain aspirin, acetaminophen, ibuprofen, naproxen, or ketoprofen unless instructed by your doctor. These medicines may hide a fever. This medicine may increase your risk to bruise or bleed. Call your doctor or health care professional if you notice any unusual bleeding. Be careful brushing and flossing your teeth or using a toothpick because you may get an infection or bleed more easily. If you have any dental work done, tell your dentist you are receiving this medicine. Do not become pregnant while taking this medicine or for 6 months after stopping it. Women should inform their health care professional if they wish to become pregnant or think they might be pregnant. Men should  not father a child while taking this medicine and for 3 months after stopping it. There is potential for serious side effects to an unborn child. Talk to your health care professional for more information. Do not breast-feed an infant while taking this medicine or for 7 days after stopping it. This medicine has caused ovarian failure in some women. This medicine may make it more difficult to get pregnant. Talk to your health care professional if you are concerned about your fertility. This medicine has caused decreased sperm counts in some men. This may make it more  difficult to father a child. Talk to your health care professional if you are concerned about your fertility. What side effects may I notice from receiving this medicine? Side effects that you should report to your doctor or health care professional as soon as possible:  allergic reactions like skin rash, itching or hives, swelling of the face, lips, or tongue  chest pain  diarrhea  flushing, runny nose, sweating during infusion  low blood counts - this medicine may decrease the number of white blood cells, red blood cells and platelets. You may be at increased risk for infections and bleeding.  nausea, vomiting  pain, swelling, warmth in the leg  signs of decreased platelets or bleeding - bruising, pinpoint red spots on the skin, black, tarry stools, blood in the urine  signs of infection - fever or chills, cough, sore throat, pain or difficulty passing urine  signs of decreased red blood cells - unusually weak or tired, fainting spells, lightheadedness Side effects that usually do not require medical attention (report to your doctor or health care professional if they continue or are bothersome):  constipation  hair loss  headache  loss of appetite  mouth sores  stomach pain This list may not describe all possible side effects. Call your doctor for medical advice about side effects. You may report side effects to FDA at 1-800-FDA-1088. Where should I keep my medicine? This drug is given in a hospital or clinic and will not be stored at home. NOTE: This sheet is a summary. It may not cover all possible information. If you have questions about this medicine, talk to your doctor, pharmacist, or health care provider.  2020 Elsevier/Gold Standard (2018-07-19 10:09:17) Bevacizumab injection What is this medicine? BEVACIZUMAB (be va SIZ yoo mab) is a monoclonal antibody. It is used to treat many types of cancer. This medicine may be used for other purposes; ask your health  care provider or pharmacist if you have questions. COMMON BRAND NAME(S): Avastin, MVASI, Zirabev What should I tell my health care provider before I take this medicine? They need to know if you have any of these conditions:  diabetes  heart disease  high blood pressure  history of coughing up blood  prior anthracycline chemotherapy (e.g., doxorubicin, daunorubicin, epirubicin)  recent or ongoing radiation therapy  recent or planning to have surgery  stroke  an unusual or allergic reaction to bevacizumab, hamster proteins, mouse proteins, other medicines, foods, dyes, or preservatives  pregnant or trying to get pregnant  breast-feeding How should I use this medicine? This medicine is for infusion into a vein. It is given by a health care professional in a hospital or clinic setting. Talk to your pediatrician regarding the use of this medicine in children. Special care may be needed. Overdosage: If you think you have taken too much of this medicine contact a poison control center or emergency room at once. NOTE: This medicine is  only for you. Do not share this medicine with others. What if I miss a dose? It is important not to miss your dose. Call your doctor or health care professional if you are unable to keep an appointment. What may interact with this medicine? Interactions are not expected. This list may not describe all possible interactions. Give your health care provider a list of all the medicines, herbs, non-prescription drugs, or dietary supplements you use. Also tell them if you smoke, drink alcohol, or use illegal drugs. Some items may interact with your medicine. What should I watch for while using this medicine? Your condition will be monitored carefully while you are receiving this medicine. You will need important blood work and urine testing done while you are taking this medicine. This medicine may increase your risk to bruise or bleed. Call your doctor or  health care professional if you notice any unusual bleeding. Before having surgery, talk to your health care provider to make sure it is ok. This drug can increase the risk of poor healing of your surgical site or wound. You will need to stop this drug for 28 days before surgery. After surgery, wait at least 28 days before restarting this drug. Make sure the surgical site or wound is healed enough before restarting this drug. Talk to your health care provider if questions. Do not become pregnant while taking this medicine or for 6 months after stopping it. Women should inform their doctor if they wish to become pregnant or think they might be pregnant. There is a potential for serious side effects to an unborn child. Talk to your health care professional or pharmacist for more information. Do not breast-feed an infant while taking this medicine and for 6 months after the last dose. This medicine has caused ovarian failure in some women. This medicine may interfere with the ability to have a child. You should talk to your doctor or health care professional if you are concerned about your fertility. What side effects may I notice from receiving this medicine? Side effects that you should report to your doctor or health care professional as soon as possible:  allergic reactions like skin rash, itching or hives, swelling of the face, lips, or tongue  chest pain or chest tightness  chills  coughing up blood  high fever  seizures  severe constipation  signs and symptoms of bleeding such as bloody or black, tarry stools; red or dark-brown urine; spitting up blood or brown material that looks like coffee grounds; red spots on the skin; unusual bruising or bleeding from the eye, gums, or nose  signs and symptoms of a blood clot such as breathing problems; chest pain; severe, sudden headache; pain, swelling, warmth in the leg  signs and symptoms of a stroke like changes in vision; confusion; trouble  speaking or understanding; severe headaches; sudden numbness or weakness of the face, arm or leg; trouble walking; dizziness; loss of balance or coordination  stomach pain  sweating  swelling of legs or ankles  vomiting  weight gain Side effects that usually do not require medical attention (report to your doctor or health care professional if they continue or are bothersome):  back pain  changes in taste  decreased appetite  dry skin  nausea  tiredness This list may not describe all possible side effects. Call your doctor for medical advice about side effects. You may report side effects to FDA at 1-800-FDA-1088. Where should I keep my medicine? This drug is given in  a hospital or clinic and will not be stored at home. NOTE: This sheet is a summary. It may not cover all possible information. If you have questions about this medicine, talk to your doctor, pharmacist, or health care provider.  2020 Elsevier/Gold Standard (2019-03-26 10:50:46)

## 2019-09-17 NOTE — Progress Notes (Signed)
First time Zirabev, Irinotecan, Leucovorin tolerated without complaint. NO stomach cramping reported when asked. Pt. Familiar with pump process and return date.

## 2019-09-17 NOTE — Progress Notes (Signed)
Ok to give bevacizumab today without urine protein per Ned Card, NP

## 2019-09-17 NOTE — Patient Instructions (Signed)

## 2019-09-18 ENCOUNTER — Telehealth: Payer: Self-pay | Admitting: *Deleted

## 2019-09-18 NOTE — Telephone Encounter (Signed)
Had sweats and some chills during the night (no fever) and woke up w/nausea this morning. Vomited X 2, but feeling better now (was at work). Asking if OK to take the prn compazine. Encouraged him to take the compazine prn and start with light diet today and advance as tolerated. Call back if not effective and if he develops a fever.

## 2019-09-19 ENCOUNTER — Other Ambulatory Visit: Payer: Self-pay

## 2019-09-19 ENCOUNTER — Inpatient Hospital Stay: Payer: Medicare PPO

## 2019-09-19 VITALS — BP 126/72 | HR 62 | Temp 98.2°F | Resp 18

## 2019-09-19 DIAGNOSIS — Z79899 Other long term (current) drug therapy: Secondary | ICD-10-CM | POA: Diagnosis not present

## 2019-09-19 DIAGNOSIS — R112 Nausea with vomiting, unspecified: Secondary | ICD-10-CM | POA: Diagnosis not present

## 2019-09-19 DIAGNOSIS — C180 Malignant neoplasm of cecum: Secondary | ICD-10-CM

## 2019-09-19 DIAGNOSIS — Z5111 Encounter for antineoplastic chemotherapy: Secondary | ICD-10-CM | POA: Diagnosis not present

## 2019-09-19 DIAGNOSIS — C787 Secondary malignant neoplasm of liver and intrahepatic bile duct: Secondary | ICD-10-CM | POA: Diagnosis not present

## 2019-09-19 DIAGNOSIS — Z5112 Encounter for antineoplastic immunotherapy: Secondary | ICD-10-CM | POA: Diagnosis not present

## 2019-09-19 MED ORDER — HEPARIN SOD (PORK) LOCK FLUSH 100 UNIT/ML IV SOLN
500.0000 [IU] | Freq: Once | INTRAVENOUS | Status: AC | PRN
  Administered 2019-09-19: 500 [IU]
  Filled 2019-09-19: qty 5

## 2019-09-19 MED ORDER — SODIUM CHLORIDE 0.9% FLUSH
10.0000 mL | INTRAVENOUS | Status: DC | PRN
  Administered 2019-09-19: 10 mL
  Filled 2019-09-19: qty 10

## 2019-09-25 NOTE — Progress Notes (Signed)
Pharmacist Chemotherapy Monitoring - Follow Up Assessment    I verify that I have reviewed each item in the below checklist:  . Regimen for the patient is scheduled for the appropriate day and plan matches scheduled date. [name] Appropriate non-routine labs are ordered dependent on drug ordered. . If applicable, additional medications reviewed and ordered per protocol based on lifetime cumulative doses and/or treatment regimen.   Plan for follow-up and/or issues identified: No . I-vent associated with next due treatment: No Miller Edgington D 09/25/2019 4:01 PM

## 2019-09-26 DIAGNOSIS — G8929 Other chronic pain: Secondary | ICD-10-CM | POA: Diagnosis not present

## 2019-09-26 DIAGNOSIS — C787 Secondary malignant neoplasm of liver and intrahepatic bile duct: Secondary | ICD-10-CM | POA: Diagnosis not present

## 2019-09-26 DIAGNOSIS — I25119 Atherosclerotic heart disease of native coronary artery with unspecified angina pectoris: Secondary | ICD-10-CM | POA: Diagnosis not present

## 2019-09-26 DIAGNOSIS — Z8546 Personal history of malignant neoplasm of prostate: Secondary | ICD-10-CM | POA: Diagnosis not present

## 2019-09-26 DIAGNOSIS — Z6831 Body mass index (BMI) 31.0-31.9, adult: Secondary | ICD-10-CM | POA: Diagnosis not present

## 2019-09-26 DIAGNOSIS — M1990 Unspecified osteoarthritis, unspecified site: Secondary | ICD-10-CM | POA: Diagnosis not present

## 2019-09-26 DIAGNOSIS — C189 Malignant neoplasm of colon, unspecified: Secondary | ICD-10-CM | POA: Diagnosis not present

## 2019-09-26 DIAGNOSIS — E669 Obesity, unspecified: Secondary | ICD-10-CM | POA: Diagnosis not present

## 2019-09-26 DIAGNOSIS — Z85828 Personal history of other malignant neoplasm of skin: Secondary | ICD-10-CM | POA: Diagnosis not present

## 2019-09-26 DIAGNOSIS — Z87891 Personal history of nicotine dependence: Secondary | ICD-10-CM | POA: Diagnosis not present

## 2019-09-26 DIAGNOSIS — E785 Hyperlipidemia, unspecified: Secondary | ICD-10-CM | POA: Diagnosis not present

## 2019-09-26 DIAGNOSIS — I10 Essential (primary) hypertension: Secondary | ICD-10-CM | POA: Diagnosis not present

## 2019-09-27 ENCOUNTER — Other Ambulatory Visit: Payer: Self-pay | Admitting: Interventional Cardiology

## 2019-09-28 ENCOUNTER — Other Ambulatory Visit: Payer: Self-pay | Admitting: Oncology

## 2019-10-01 ENCOUNTER — Other Ambulatory Visit: Payer: Self-pay

## 2019-10-01 ENCOUNTER — Inpatient Hospital Stay: Payer: Medicare PPO

## 2019-10-01 ENCOUNTER — Inpatient Hospital Stay: Payer: Medicare PPO | Admitting: Oncology

## 2019-10-01 VITALS — BP 139/66 | HR 62 | Temp 98.5°F | Resp 17 | Ht 71.0 in | Wt 231.9 lb

## 2019-10-01 DIAGNOSIS — Z5112 Encounter for antineoplastic immunotherapy: Secondary | ICD-10-CM | POA: Diagnosis not present

## 2019-10-01 DIAGNOSIS — C180 Malignant neoplasm of cecum: Secondary | ICD-10-CM

## 2019-10-01 DIAGNOSIS — Z95828 Presence of other vascular implants and grafts: Secondary | ICD-10-CM

## 2019-10-01 DIAGNOSIS — Z5111 Encounter for antineoplastic chemotherapy: Secondary | ICD-10-CM | POA: Diagnosis not present

## 2019-10-01 DIAGNOSIS — C787 Secondary malignant neoplasm of liver and intrahepatic bile duct: Secondary | ICD-10-CM | POA: Diagnosis not present

## 2019-10-01 DIAGNOSIS — R112 Nausea with vomiting, unspecified: Secondary | ICD-10-CM | POA: Diagnosis not present

## 2019-10-01 DIAGNOSIS — Z79899 Other long term (current) drug therapy: Secondary | ICD-10-CM | POA: Diagnosis not present

## 2019-10-01 LAB — CMP (CANCER CENTER ONLY)
ALT: 25 U/L (ref 0–44)
AST: 33 U/L (ref 15–41)
Albumin: 3.7 g/dL (ref 3.5–5.0)
Alkaline Phosphatase: 119 U/L (ref 38–126)
Anion gap: 7 (ref 5–15)
BUN: 16 mg/dL (ref 8–23)
CO2: 26 mmol/L (ref 22–32)
Calcium: 8.6 mg/dL — ABNORMAL LOW (ref 8.9–10.3)
Chloride: 106 mmol/L (ref 98–111)
Creatinine: 0.91 mg/dL (ref 0.61–1.24)
GFR, Est AFR Am: >60 mL/min (ref >60)
GFR, Estimated: >60 mL/min (ref >60)
Glucose, Bld: 96 mg/dL (ref 70–99)
Potassium: 4.6 mmol/L (ref 3.5–5.1)
Sodium: 139 mmol/L (ref 135–145)
Total Bilirubin: 0.5 mg/dL (ref 0.3–1.2)
Total Protein: 6.3 g/dL — ABNORMAL LOW (ref 6.5–8.1)

## 2019-10-01 LAB — CBC WITH DIFFERENTIAL (CANCER CENTER ONLY)
Abs Immature Granulocytes: 0.01 10*3/uL (ref 0.00–0.07)
Basophils Absolute: 0 10*3/uL (ref 0.0–0.1)
Basophils Relative: 1 %
Eosinophils Absolute: 0.2 10*3/uL (ref 0.0–0.5)
Eosinophils Relative: 5 %
HCT: 35 % — ABNORMAL LOW (ref 39.0–52.0)
Hemoglobin: 11.9 g/dL — ABNORMAL LOW (ref 13.0–17.0)
Immature Granulocytes: 0 %
Lymphocytes Relative: 10 %
Lymphs Abs: 0.4 10*3/uL — ABNORMAL LOW (ref 0.7–4.0)
MCH: 32.6 pg (ref 26.0–34.0)
MCHC: 34 g/dL (ref 30.0–36.0)
MCV: 95.9 fL (ref 80.0–100.0)
Monocytes Absolute: 0.5 10*3/uL (ref 0.1–1.0)
Monocytes Relative: 13 %
Neutro Abs: 2.9 10*3/uL (ref 1.7–7.7)
Neutrophils Relative %: 71 %
Platelet Count: 119 10*3/uL — ABNORMAL LOW (ref 150–400)
RBC: 3.65 MIL/uL — ABNORMAL LOW (ref 4.22–5.81)
RDW: 13.4 % (ref 11.5–15.5)
WBC Count: 4 10*3/uL (ref 4.0–10.5)
nRBC: 0 % (ref 0.0–0.2)

## 2019-10-01 LAB — TOTAL PROTEIN, URINE DIPSTICK: Protein, ur: NEGATIVE mg/dL

## 2019-10-01 MED ORDER — ATROPINE SULFATE 1 MG/ML IJ SOLN: 0.5000 mg | Freq: Once | INTRAMUSCULAR | Status: DC | PRN

## 2019-10-01 MED ORDER — SODIUM CHLORIDE 0.9 % IV SOLN
175.0000 mg/m2 | Freq: Once | INTRAVENOUS | Status: AC
  Administered 2019-10-01: 400 mg via INTRAVENOUS
  Filled 2019-10-01: qty 15

## 2019-10-01 MED ORDER — SODIUM CHLORIDE 0.9 % IV SOLN
Freq: Once | INTRAVENOUS | Status: AC
  Filled 2019-10-01: qty 250

## 2019-10-01 MED ORDER — ATROPINE SULFATE 1 MG/ML IJ SOLN
INTRAMUSCULAR | Status: AC
  Filled 2019-10-01: qty 1

## 2019-10-01 MED ORDER — SODIUM CHLORIDE 0.9 % IV SOLN
2000.0000 mg/m2 | INTRAVENOUS | Status: DC
  Administered 2019-10-01: 16:00:00 4650 mg via INTRAVENOUS
  Filled 2019-10-01: qty 93

## 2019-10-01 MED ORDER — SODIUM CHLORIDE 0.9 % IV SOLN
10.0000 mg | Freq: Once | INTRAVENOUS | Status: AC
  Administered 2019-10-01: 10 mg via INTRAVENOUS
  Filled 2019-10-01: qty 10

## 2019-10-01 MED ORDER — PALONOSETRON HCL INJECTION 0.25 MG/5ML
INTRAVENOUS | Status: AC
  Filled 2019-10-01: qty 5

## 2019-10-01 MED ORDER — SODIUM CHLORIDE 0.9% FLUSH
10.0000 mL | INTRAVENOUS | Status: DC | PRN
  Filled 2019-10-01: qty 10

## 2019-10-01 MED ORDER — SODIUM CHLORIDE 0.9 % IV SOLN
400.0000 mg/m2 | Freq: Once | INTRAVENOUS | Status: AC
  Administered 2019-10-01: 928 mg via INTRAVENOUS
  Filled 2019-10-01: qty 46.4

## 2019-10-01 MED ORDER — PALONOSETRON HCL INJECTION 0.25 MG/5ML
0.2500 mg | Freq: Once | INTRAVENOUS | Status: AC
  Administered 2019-10-01: 0.25 mg via INTRAVENOUS

## 2019-10-01 MED ORDER — SODIUM CHLORIDE 0.9% FLUSH
10.0000 mL | INTRAVENOUS | Status: DC | PRN
  Administered 2019-10-01: 10 mL
  Filled 2019-10-01: qty 10

## 2019-10-01 MED ORDER — SODIUM CHLORIDE 0.9 % IV SOLN
5.0000 mg/kg | Freq: Once | INTRAVENOUS | Status: AC
  Administered 2019-10-01: 500 mg via INTRAVENOUS
  Filled 2019-10-01: qty 16

## 2019-10-01 NOTE — Patient Instructions (Addendum)
[facility] Discharge Instructions for Patients Receiving Chemotherapy  Today you received the following chemotherapy agents: Bevacizumab, Irinotecan, Leucovorin and Adrucil.  To help prevent nausea and vomiting after your treatment, we encourage you to take your nausea medication as directed.    If you develop nausea and vomiting that is not controlled by your nausea medication, call the clinic.   BELOW ARE SYMPTOMS THAT SHOULD BE REPORTED IMMEDIATELY:  *FEVER GREATER THAN 100.5 F  *CHILLS WITH OR WITHOUT FEVER  NAUSEA AND VOMITING THAT IS NOT CONTROLLED WITH YOUR NAUSEA MEDICATION  *UNUSUAL SHORTNESS OF BREATH  *UNUSUAL BRUISING OR BLEEDING  TENDERNESS IN MOUTH AND THROAT WITH OR WITHOUT PRESENCE OF ULCERS  *URINARY PROBLEMS  *BOWEL PROBLEMS  UNUSUAL RASH Items with * indicate a potential emergency and should be followed up as soon as possible.  Feel free to call the clinic should you have any questions or concerns. The clinic phone number is [phone].  Please show the CHEMO ALERT CARD at check-in to the Emergency Department and triage nurse.   

## 2019-10-01 NOTE — Progress Notes (Signed)
[facility] OFFICE PROGRESS NOTE   Diagnosis: Colon cancer  INTERVAL HISTORY:   Luis [name] completed a first cycle of FOLFIRI/Avastin on 09/17/2019.  He had 2 episodes of nausea/vomiting on the day following chemotherapy.  The nausea then resolved.  No diarrhea or mouth sores.  He had an episode of chest discomfort typical of his angina days following chemotherapy.  This lasted for less than 5 minutes and was relieved with nitroglycerin. Neuropathy symptoms have improved.  Objective:  Vital signs in last 24 hours:  Blood pressure 139/66, pulse 62, temperature 98.5 F (36.9 C), temperature source Temporal, resp. rate 17, height '5\' 11"'$  (1.803 m), weight 231 lb 14.4 oz (105.2 kg), SpO2 98 %.    Resp: Lungs clear bilaterally Cardio: Regular rate and rhythm GI: No hepatosplenomegaly, nontender Vascular: Trace edema at the lower leg bilaterally, left greater than right  Skin: Palms without erythema  Portacath/PICC-without erythema  Lab Results:  Lab Results  Component Value Date   WBC 4.0 10/01/2019   HGB 11.9 (L) 10/01/2019   HCT 35.0 (L) 10/01/2019   MCV 95.9 10/01/2019   PLT 119 (L) 10/01/2019   NEUTROABS 2.9 10/01/2019    CMP  Lab Results  Component Value Date   NA 139 10/01/2019   K 4.6 10/01/2019   CL 106 10/01/2019   CO2 26 10/01/2019   GLUCOSE 96 10/01/2019   BUN 16 10/01/2019   CREATININE 0.91 10/01/2019   CALCIUM 8.6 (L) 10/01/2019   PROT 6.3 (L) 10/01/2019   ALBUMIN 3.7 10/01/2019   AST 33 10/01/2019   ALT 25 10/01/2019   ALKPHOS 119 10/01/2019   BILITOT 0.5 10/01/2019   GFRNONAA >60 10/01/2019   GFRAA >60 10/01/2019    Lab Results  Component Value Date   CEA1 4.89 09/17/2019     Medications: I have reviewed the patient's current medications.   Assessment/Plan: 1. Adenocarcinoma of the cecum, stage IIA(T3N0), status post a right colectomy 07/26/2017 ? 0/16 lymph nodes positive, no lymphovascular invasion, perineural invasion  present ? MSI-stable, no loss of mismatch repair protein expression ? Surveillance colonoscopy 07/26/2018-no polyps found ? Elevated CEA June 2020, persistently elevated August 2020 ? CTs 02/07/2019-bilateral liver metastases, no extrahepatic metastatic disease, changes of sarcoidosis in the chest ? Biopsy liver lesion 03/04/2019-metastatic adenocarcinoma, with morphology consistent with metastatic colonic adenocarcinoma, RAS WT ? Cycle 1 FOLFOX 03/12/2019 ? Cycle 2 FOLFOX 03/26/2019, oxaliplatin dose reduced secondary to thrombocytopenia ? Cycle 3 FOLFOX 04/09/2019, oxaliplatin further dose reduced and aspirin held secondary to thrombocytopenia ? Cycle 4 FOLFOX 04/23/2019 ? Cycle 5 FOLFOX 05/07/2019 ? CTs 05/23/2019-decrease in liver lesions.  No new or progressive findings. ? Cycle 6 FOLFOX 05/27/2019 ? Cycle 7 FOLFOX 06/14/2019 ? Cycle 8 FOLFOX 07/02/2019-Udenyca held ? Cycle 9 FOLFOX 07/16/2019-Udenyca held ? Cycle 10 FOLFOX 07/30/2019-Udenyca held ? CT abdomen/pelvis 08/13/2019-mild improvement in previously noted liver lesions ? MRI abdomen 08/13/2019-3 liver lesions  ? Cycle 1 FOLFIRI/Avastin 09/17/2019 ? Cycle 2 FOLFIRI/Avastin 10/01/2019 2. History of colon polyps 3. Microcytic anemia January 2019 4. Sarcoidosis 5. Coronary artery disease 6. Port-A-Cath placement, interventional radiology, 03/04/2019 7. Thrombocytopenia, mild thrombocytopenia predating chemotherapy 8. Oxaliplatin neuropathy    Disposition: Luis [name] appears stable.  He had nausea and vomiting on the day following cycle 1 FOLFIRI.  I offered to adjust the antiemetic premedications today, but he did not wish to make a change.  He will call for nausea following this cycle of chemotherapy.  He will complete cycle 2 FOLFIRI/Avastin today.  Luis [name] will contact us for recurrent chest pain.  He will return for an office visit in the next cycle of chemotherapy in 2 weeks.  Betsy Coder, MD  10/01/2019  12:06 PM

## 2019-10-02 ENCOUNTER — Telehealth: Payer: Self-pay | Admitting: Oncology

## 2019-10-02 NOTE — Telephone Encounter (Signed)
Scheduled per los. Called and spoke with patient. Confirmed appt 

## 2019-10-03 ENCOUNTER — Other Ambulatory Visit: Payer: Self-pay

## 2019-10-03 ENCOUNTER — Inpatient Hospital Stay: Payer: Medicare PPO

## 2019-10-03 VITALS — BP 128/72 | HR 72 | Temp 98.0°F | Resp 18

## 2019-10-03 DIAGNOSIS — R112 Nausea with vomiting, unspecified: Secondary | ICD-10-CM | POA: Diagnosis not present

## 2019-10-03 DIAGNOSIS — I10 Essential (primary) hypertension: Secondary | ICD-10-CM | POA: Diagnosis not present

## 2019-10-03 DIAGNOSIS — C787 Secondary malignant neoplasm of liver and intrahepatic bile duct: Secondary | ICD-10-CM | POA: Diagnosis not present

## 2019-10-03 DIAGNOSIS — C180 Malignant neoplasm of cecum: Secondary | ICD-10-CM | POA: Diagnosis not present

## 2019-10-03 DIAGNOSIS — Z5112 Encounter for antineoplastic immunotherapy: Secondary | ICD-10-CM | POA: Diagnosis not present

## 2019-10-03 DIAGNOSIS — Z79899 Other long term (current) drug therapy: Secondary | ICD-10-CM | POA: Diagnosis not present

## 2019-10-03 DIAGNOSIS — I2510 Atherosclerotic heart disease of native coronary artery without angina pectoris: Secondary | ICD-10-CM | POA: Diagnosis not present

## 2019-10-03 DIAGNOSIS — Z5111 Encounter for antineoplastic chemotherapy: Secondary | ICD-10-CM | POA: Diagnosis not present

## 2019-10-03 DIAGNOSIS — I771 Stricture of artery: Secondary | ICD-10-CM | POA: Diagnosis not present

## 2019-10-03 DIAGNOSIS — Z1331 Encounter for screening for depression: Secondary | ICD-10-CM | POA: Diagnosis not present

## 2019-10-03 DIAGNOSIS — C189 Malignant neoplasm of colon, unspecified: Secondary | ICD-10-CM | POA: Diagnosis not present

## 2019-10-03 DIAGNOSIS — D860 Sarcoidosis of lung: Secondary | ICD-10-CM | POA: Diagnosis not present

## 2019-10-03 DIAGNOSIS — E785 Hyperlipidemia, unspecified: Secondary | ICD-10-CM | POA: Diagnosis not present

## 2019-10-03 MED ORDER — SODIUM CHLORIDE 0.9% FLUSH
10.0000 mL | INTRAVENOUS | Status: DC | PRN
  Administered 2019-10-03: 10 mL
  Filled 2019-10-03: qty 10

## 2019-10-03 MED ORDER — HEPARIN SOD (PORK) LOCK FLUSH 100 UNIT/ML IV SOLN
500.0000 [IU] | Freq: Once | INTRAVENOUS | Status: AC | PRN
  Administered 2019-10-03: 500 [IU]
  Filled 2019-10-03: qty 5

## 2019-10-09 NOTE — Progress Notes (Signed)
Pharmacist Chemotherapy Monitoring - Follow Up Assessment    I verify that I have reviewed each item in the below checklist:  . Regimen for the patient is scheduled for the appropriate day and plan matches scheduled date. [name] Appropriate non-routine labs are ordered dependent on drug ordered. . If applicable, additional medications reviewed and ordered per protocol based on lifetime cumulative doses and/or treatment regimen.   Plan for follow-up and/or issues identified: Yes . I-vent associated with next due treatment: Yes . MD and/or nursing notified: No   Kennith Center, Pharm.D., CPP 10/09/2019@8 :46 AM

## 2019-10-10 DIAGNOSIS — C180 Malignant neoplasm of cecum: Secondary | ICD-10-CM | POA: Diagnosis not present

## 2019-10-12 ENCOUNTER — Other Ambulatory Visit: Payer: Self-pay | Admitting: Oncology

## 2019-10-15 ENCOUNTER — Inpatient Hospital Stay: Payer: Medicare PPO | Attending: Nurse Practitioner | Admitting: Nurse Practitioner

## 2019-10-15 ENCOUNTER — Encounter: Payer: Self-pay | Admitting: Nurse Practitioner

## 2019-10-15 ENCOUNTER — Inpatient Hospital Stay: Payer: Medicare PPO

## 2019-10-15 ENCOUNTER — Other Ambulatory Visit: Payer: Self-pay

## 2019-10-15 VITALS — BP 135/62 | HR 60 | Temp 98.0°F | Resp 18 | Ht 71.0 in | Wt 234.4 lb

## 2019-10-15 DIAGNOSIS — Z79899 Other long term (current) drug therapy: Secondary | ICD-10-CM | POA: Insufficient documentation

## 2019-10-15 DIAGNOSIS — C180 Malignant neoplasm of cecum: Secondary | ICD-10-CM | POA: Insufficient documentation

## 2019-10-15 DIAGNOSIS — Z5112 Encounter for antineoplastic immunotherapy: Secondary | ICD-10-CM | POA: Diagnosis not present

## 2019-10-15 DIAGNOSIS — Z5111 Encounter for antineoplastic chemotherapy: Secondary | ICD-10-CM | POA: Diagnosis not present

## 2019-10-15 DIAGNOSIS — C787 Secondary malignant neoplasm of liver and intrahepatic bile duct: Secondary | ICD-10-CM | POA: Diagnosis not present

## 2019-10-15 LAB — CMP (CANCER CENTER ONLY)
ALT: 24 U/L (ref 0–44)
AST: 29 U/L (ref 15–41)
Albumin: 3.6 g/dL (ref 3.5–5.0)
Alkaline Phosphatase: 102 U/L (ref 38–126)
Anion gap: 7 (ref 5–15)
BUN: 17 mg/dL (ref 8–23)
CO2: 26 mmol/L (ref 22–32)
Calcium: 8.6 mg/dL — ABNORMAL LOW (ref 8.9–10.3)
Chloride: 107 mmol/L (ref 98–111)
Creatinine: 0.87 mg/dL (ref 0.61–1.24)
GFR, Est AFR Am: >60 mL/min (ref >60)
GFR, Estimated: >60 mL/min (ref >60)
Glucose, Bld: 107 mg/dL — ABNORMAL HIGH (ref 70–99)
Potassium: 4.5 mmol/L (ref 3.5–5.1)
Sodium: 140 mmol/L (ref 135–145)
Total Bilirubin: 0.4 mg/dL (ref 0.3–1.2)
Total Protein: 6.4 g/dL — ABNORMAL LOW (ref 6.5–8.1)

## 2019-10-15 LAB — CBC WITH DIFFERENTIAL (CANCER CENTER ONLY)
Abs Immature Granulocytes: 0.01 10*3/uL (ref 0.00–0.07)
Basophils Absolute: 0 10*3/uL (ref 0.0–0.1)
Basophils Relative: 1 %
Eosinophils Absolute: 0.1 10*3/uL (ref 0.0–0.5)
Eosinophils Relative: 3 %
HCT: 34.2 % — ABNORMAL LOW (ref 39.0–52.0)
Hemoglobin: 11.7 g/dL — ABNORMAL LOW (ref 13.0–17.0)
Immature Granulocytes: 0 %
Lymphocytes Relative: 12 %
Lymphs Abs: 0.4 10*3/uL — ABNORMAL LOW (ref 0.7–4.0)
MCH: 32.8 pg (ref 26.0–34.0)
MCHC: 34.2 g/dL (ref 30.0–36.0)
MCV: 95.8 fL (ref 80.0–100.0)
Monocytes Absolute: 0.5 10*3/uL (ref 0.1–1.0)
Monocytes Relative: 14 %
Neutro Abs: 2.4 10*3/uL (ref 1.7–7.7)
Neutrophils Relative %: 70 %
Platelet Count: 129 10*3/uL — ABNORMAL LOW (ref 150–400)
RBC: 3.57 MIL/uL — ABNORMAL LOW (ref 4.22–5.81)
RDW: 14 % (ref 11.5–15.5)
WBC Count: 3.4 10*3/uL — ABNORMAL LOW (ref 4.0–10.5)
nRBC: 0 % (ref 0.0–0.2)

## 2019-10-15 MED ORDER — ATROPINE SULFATE 1 MG/ML IJ SOLN: 0.5000 mg | Freq: Once | INTRAMUSCULAR | Status: DC | PRN

## 2019-10-15 MED ORDER — SODIUM CHLORIDE 0.9 % IV SOLN
175.0000 mg/m2 | Freq: Once | INTRAVENOUS | Status: AC
  Administered 2019-10-15: 400 mg via INTRAVENOUS
  Filled 2019-10-15: qty 15

## 2019-10-15 MED ORDER — PALONOSETRON HCL INJECTION 0.25 MG/5ML
0.2500 mg | Freq: Once | INTRAVENOUS | Status: AC
  Administered 2019-10-15: 0.25 mg via INTRAVENOUS

## 2019-10-15 MED ORDER — SODIUM CHLORIDE 0.9 % IV SOLN
400.0000 mg/m2 | Freq: Once | INTRAVENOUS | Status: AC
  Administered 2019-10-15: 928 mg via INTRAVENOUS
  Filled 2019-10-15: qty 46.4

## 2019-10-15 MED ORDER — ATROPINE SULFATE 0.4 MG/ML IJ SOLN
INTRAMUSCULAR | Status: AC
  Filled 2019-10-15: qty 1

## 2019-10-15 MED ORDER — SODIUM CHLORIDE 0.9 % IV SOLN
Freq: Once | INTRAVENOUS | Status: AC
  Filled 2019-10-15: qty 250

## 2019-10-15 MED ORDER — HEPARIN SOD (PORK) LOCK FLUSH 100 UNIT/ML IV SOLN
500.0000 [IU] | Freq: Once | INTRAVENOUS | Status: DC | PRN
  Filled 2019-10-15: qty 5

## 2019-10-15 MED ORDER — SODIUM CHLORIDE 0.9 % IV SOLN
10.0000 mg | Freq: Once | INTRAVENOUS | Status: AC
  Administered 2019-10-15: 10 mg via INTRAVENOUS
  Filled 2019-10-15: qty 10

## 2019-10-15 MED ORDER — SODIUM CHLORIDE 0.9% FLUSH
10.0000 mL | INTRAVENOUS | Status: DC | PRN
  Filled 2019-10-15: qty 10

## 2019-10-15 MED ORDER — PALONOSETRON HCL INJECTION 0.25 MG/5ML
INTRAVENOUS | Status: AC
  Filled 2019-10-15: qty 5

## 2019-10-15 MED ORDER — SODIUM CHLORIDE 0.9 % IV SOLN
5.0000 mg/kg | Freq: Once | INTRAVENOUS | Status: AC
  Administered 2019-10-15: 500 mg via INTRAVENOUS
  Filled 2019-10-15: qty 4

## 2019-10-15 MED ORDER — SODIUM CHLORIDE 0.9 % IV SOLN
2000.0000 mg/m2 | INTRAVENOUS | Status: DC
  Administered 2019-10-15: 4650 mg via INTRAVENOUS
  Filled 2019-10-15: qty 93

## 2019-10-15 NOTE — Patient Instructions (Signed)
Please provide copy of your Medical Advanced Directive to have scanned into your record. 

## 2019-10-15 NOTE — Progress Notes (Signed)
  [place] OFFICE PROGRESS NOTE   Diagnosis: Colon cancer  INTERVAL HISTORY:   Luis [name] returns as scheduled.  He completed cycle 2 FOLFIRI/Avastin 10/01/2019.  He denies nausea/vomiting.  He has a single mouth sore that has resolved.  No diarrhea.  He intermittently notes burning at the "edge of the anus" with a bowel movement.  At times he sees small spots of blood on the toilet tissue.  He reports having internal hemorrhoids, no external hemorrhoids.  He has been applying clobetasol cream with improvement in the burning sensation.  Stable dyspnea on exertion.  No further episodes of chest pain.  Objective:  Vital signs in last 24 hours:  Blood pressure 135/62, pulse 60, temperature 98 F (36.7 C), temperature source Temporal, resp. rate 18, height '5\' 11"'$  (1.803 m), weight 234 lb 6.4 oz (106.3 kg), SpO2 100 %.    HEENT: No thrush or ulcers. GI: Abdomen soft and nontender.  No hepatomegaly.  No external hemorrhoids noted.  No erythema.  Tiny very superficial break in the skin, right anal verge, no drainage. Vascular: Trace edema at the lower legs bilaterally.  Skin: Palms without erythema. Port-A-Cath without erythema.   Lab Results:  Lab Results  Component Value Date   WBC 3.4 (L) 10/15/2019   HGB 11.7 (L) 10/15/2019   HCT 34.2 (L) 10/15/2019   MCV 95.8 10/15/2019   PLT 129 (L) 10/15/2019   NEUTROABS 2.4 10/15/2019    Imaging:  No results found.  Medications: I have reviewed the patient's current medications.  Assessment/Plan: 1. Adenocarcinoma of the cecum, stage IIA(T3N0), status post a right colectomy 07/26/2017 ? 0/16 lymph nodes positive, no lymphovascular invasion, perineural invasion present ? MSI-stable, no loss of mismatch repair protein expression ? Surveillance colonoscopy 07/26/2018-no polyps found ? Elevated CEA June 2020, persistently elevated August 2020 ? CTs 02/07/2019-bilateral liver metastases, no extrahepatic metastatic disease,  changes of sarcoidosis in the chest ? Biopsy liver lesion 03/04/2019-metastatic adenocarcinoma, with morphology consistent with metastatic colonic adenocarcinoma, RAS WT ? Cycle 1 FOLFOX 03/12/2019 ? Cycle 2 FOLFOX 03/26/2019, oxaliplatin dose reduced secondary to thrombocytopenia ? Cycle 3 FOLFOX 04/09/2019, oxaliplatin further dose reduced and aspirin held secondary to thrombocytopenia ? Cycle 4 FOLFOX 04/23/2019 ? Cycle 5 FOLFOX 05/07/2019 ? CTs 05/23/2019-decrease in liver lesions. No new or progressive findings. ? Cycle 6 FOLFOX 05/27/2019 ? Cycle 7 FOLFOX 06/14/2019 ? Cycle 8 FOLFOX 07/02/2019-Udenyca held ? Cycle 9 FOLFOX 07/16/2019-Udenyca held ? Cycle 10 FOLFOX 07/30/2019-Udenyca held ? CT abdomen/pelvis 08/13/2019-mild improvement in previously noted liver lesions ? MRI abdomen 08/13/2019-3 liver lesions  ? Cycle 1 FOLFIRI/Avastin 09/17/2019 ? Cycle 2 FOLFIRI/Avastin 10/01/2019 ? Cycle 3 FOLFIRI/Avastin 10/15/2019 2. History of colon polyps 3. Microcytic anemia January 2019 4. Sarcoidosis 5. Coronary artery disease 6. Port-A-Cath placement, interventional radiology, 03/04/2019 7. Thrombocytopenia, mild thrombocytopenia predating chemotherapy 8. Oxaliplatin neuropathy  Disposition: Luis [name] appears stable.  He has completed 2 cycles of FOLFIRI/Avastin.  He tolerated cycle 2 well.  Plan to proceed with cycle 3 today as scheduled.  We reviewed the CBC from today.  Counts adequate to proceed with treatment.  He will return for lab, follow-up, FOLFIRI/Avastin in 2 weeks.  He will contact the office in the interim with any problems.  Plan reviewed with Dr. Benay Spice.    Ned Card ANP/GNP-BC   10/15/2019  10:12 AM

## 2019-10-15 NOTE — Patient Instructions (Signed)
[facility] Discharge Instructions for Patients Receiving Chemotherapy  Today you received the following chemotherapy agents: Bevacizumab, Irinotecan, Leucovorin and Adrucil.  To help prevent nausea and vomiting after your treatment, we encourage you to take your nausea medication as directed.    If you develop nausea and vomiting that is not controlled by your nausea medication, call the clinic.   BELOW ARE SYMPTOMS THAT SHOULD BE REPORTED IMMEDIATELY:  *FEVER GREATER THAN 100.5 F  *CHILLS WITH OR WITHOUT FEVER  NAUSEA AND VOMITING THAT IS NOT CONTROLLED WITH YOUR NAUSEA MEDICATION  *UNUSUAL SHORTNESS OF BREATH  *UNUSUAL BRUISING OR BLEEDING  TENDERNESS IN MOUTH AND THROAT WITH OR WITHOUT PRESENCE OF ULCERS  *URINARY PROBLEMS  *BOWEL PROBLEMS  UNUSUAL RASH Items with * indicate a potential emergency and should be followed up as soon as possible.  Feel free to call the clinic should you have any questions or concerns. The clinic phone number is [phone].  Please show the CHEMO ALERT CARD at check-in to the Emergency Department and triage nurse.   

## 2019-10-17 ENCOUNTER — Inpatient Hospital Stay: Payer: Medicare PPO

## 2019-10-17 ENCOUNTER — Other Ambulatory Visit: Payer: Self-pay

## 2019-10-17 VITALS — BP 142/70 | HR 58 | Temp 98.7°F | Resp 18

## 2019-10-17 DIAGNOSIS — Z5111 Encounter for antineoplastic chemotherapy: Secondary | ICD-10-CM | POA: Diagnosis not present

## 2019-10-17 DIAGNOSIS — Z5112 Encounter for antineoplastic immunotherapy: Secondary | ICD-10-CM | POA: Diagnosis not present

## 2019-10-17 DIAGNOSIS — C180 Malignant neoplasm of cecum: Secondary | ICD-10-CM | POA: Diagnosis not present

## 2019-10-17 DIAGNOSIS — C787 Secondary malignant neoplasm of liver and intrahepatic bile duct: Secondary | ICD-10-CM | POA: Diagnosis not present

## 2019-10-17 DIAGNOSIS — Z79899 Other long term (current) drug therapy: Secondary | ICD-10-CM | POA: Diagnosis not present

## 2019-10-17 MED ORDER — HEPARIN SOD (PORK) LOCK FLUSH 100 UNIT/ML IV SOLN
500.0000 [IU] | Freq: Once | INTRAVENOUS | Status: AC | PRN
  Administered 2019-10-17: 500 [IU]
  Filled 2019-10-17: qty 5

## 2019-10-17 MED ORDER — SODIUM CHLORIDE 0.9% FLUSH
10.0000 mL | INTRAVENOUS | Status: DC | PRN
  Administered 2019-10-17: 10 mL
  Filled 2019-10-17: qty 10

## 2019-10-17 NOTE — Patient Instructions (Signed)

## 2019-10-22 ENCOUNTER — Telehealth: Payer: Self-pay | Admitting: Interventional Cardiology

## 2019-10-22 ENCOUNTER — Telehealth: Payer: Self-pay | Admitting: *Deleted

## 2019-10-22 NOTE — Telephone Encounter (Signed)
*  STAT* If patient is at the pharmacy, call can be transferred to refill team.   1. Which medications need to be refilled? (please list name of each medication and dose if known)  ranolazine (RANEXA) 500 MG 12 hr tablet  2. Which pharmacy/location (including street and city if local pharmacy) is medication to be sent to? [id number] - [place], [place]  3. Do they need a 30 day or 90 day supply? 90 day supply   Patient states when he tried to pick up this medication they stated he was not due for a refill until 11/17/19, but they had only given him a 30 day supply prior. Please advise.

## 2019-10-22 NOTE — Telephone Encounter (Signed)
I spoke to the patient who has experienced CP, SOB and sweating twice over the past 7 weeks (the start of Chemo treatment).  His most recent occurred on 5/12 @ 3 am.  His CP radiated to his left arm, but was relieved after 2 Nitros.    He was told by his Oncologist that Chemo would give him these side effects.  His Oncologist is supposed to be calling Dr Irish Lack to discuss.  The patient has 2 more treatments of Chemo and would like to wait until after treatments to see if symptoms subside.  He will call to f/u or go to the ED, if symptoms worsen.

## 2019-10-22 NOTE — Telephone Encounter (Signed)
Pt c/o of Chest Pain: STAT if CP now or developed within 24 hours  1. Are you having CP right now? No  2. Are you experiencing any other symptoms (ex. SOB, nausea, vomiting, sweating)? Sweating   3. How long have you been experiencing CP? About 15 minutes   4. Is your CP continuous or coming and going? Continuous   5. Have you taken Nitroglycerin? Yes, took 2 after first didn't work    Jabe is calling stating he had about a 15 minute episode of CP yesterday 10/21/19. He states he also experienced swelling. He took two nitroglycerin, but it was not at the same time. He states he took the first one and waited, after feeling like it wasn't working he took another. He states the pain was continuous. Please advise.   ?

## 2019-10-22 NOTE — Telephone Encounter (Signed)
Called pt and informed him that his medication was ready to be picked up at his pharmacy and if he has any other problems, questions or concerns, to give our office a call back. Pt verbalized understanding.

## 2019-10-22 NOTE — Telephone Encounter (Addendum)
Reports he was awakened from sleep last night with angina. Took a NTG and repeated 5 minutes later w/relief. No dyspnea or other symptoms. Says he feels "very sensitive to cold today". Will relay information to Dr. Benay Spice and suggested he contact his cardiologist as well. Per Dr. Benay Spice: He needs to call cardiologist. Patient agrees.

## 2019-10-30 NOTE — Progress Notes (Signed)
Pharmacist Chemotherapy Monitoring - Follow Up Assessment    I verify that I have reviewed each item in the below checklist:  . Regimen for the patient is scheduled for the appropriate day and plan matches scheduled date. [name] Appropriate non-routine labs are ordered dependent on drug ordered. . If applicable, additional medications reviewed and ordered per protocol based on lifetime cumulative doses and/or treatment regimen.   Plan for follow-up and/or issues identified: Yes . I-vent associated with next due treatment: Yes . MD and/or nursing notified: No  Luis [name] 10/30/2019 8:30 AM

## 2019-11-01 ENCOUNTER — Other Ambulatory Visit: Payer: Self-pay | Admitting: Oncology

## 2019-11-05 ENCOUNTER — Inpatient Hospital Stay: Payer: Medicare PPO

## 2019-11-05 ENCOUNTER — Inpatient Hospital Stay: Payer: Medicare PPO | Admitting: Oncology

## 2019-11-05 ENCOUNTER — Other Ambulatory Visit: Payer: Self-pay

## 2019-11-05 VITALS — BP 141/71 | HR 57 | Temp 98.0°F | Resp 16 | Wt 234.4 lb

## 2019-11-05 DIAGNOSIS — Z95828 Presence of other vascular implants and grafts: Secondary | ICD-10-CM

## 2019-11-05 DIAGNOSIS — C180 Malignant neoplasm of cecum: Secondary | ICD-10-CM

## 2019-11-05 DIAGNOSIS — Z5111 Encounter for antineoplastic chemotherapy: Secondary | ICD-10-CM | POA: Diagnosis not present

## 2019-11-05 DIAGNOSIS — C787 Secondary malignant neoplasm of liver and intrahepatic bile duct: Secondary | ICD-10-CM | POA: Diagnosis not present

## 2019-11-05 DIAGNOSIS — Z79899 Other long term (current) drug therapy: Secondary | ICD-10-CM | POA: Diagnosis not present

## 2019-11-05 DIAGNOSIS — Z5112 Encounter for antineoplastic immunotherapy: Secondary | ICD-10-CM | POA: Diagnosis not present

## 2019-11-05 LAB — CBC WITH DIFFERENTIAL (CANCER CENTER ONLY)
Abs Immature Granulocytes: 0.02 10*3/uL (ref 0.00–0.07)
Basophils Absolute: 0 10*3/uL (ref 0.0–0.1)
Basophils Relative: 1 %
Eosinophils Absolute: 0.2 10*3/uL (ref 0.0–0.5)
Eosinophils Relative: 6 %
HCT: 35.9 % — ABNORMAL LOW (ref 39.0–52.0)
Hemoglobin: 12.1 g/dL — ABNORMAL LOW (ref 13.0–17.0)
Immature Granulocytes: 1 %
Lymphocytes Relative: 10 %
Lymphs Abs: 0.3 10*3/uL — ABNORMAL LOW (ref 0.7–4.0)
MCH: 31.8 pg (ref 26.0–34.0)
MCHC: 33.7 g/dL (ref 30.0–36.0)
MCV: 94.5 fL (ref 80.0–100.0)
Monocytes Absolute: 0.7 10*3/uL (ref 0.1–1.0)
Monocytes Relative: 21 %
Neutro Abs: 2.1 10*3/uL (ref 1.7–7.7)
Neutrophils Relative %: 61 %
Platelet Count: 135 10*3/uL — ABNORMAL LOW (ref 150–400)
RBC: 3.8 MIL/uL — ABNORMAL LOW (ref 4.22–5.81)
RDW: 14.7 % (ref 11.5–15.5)
WBC Count: 3.4 10*3/uL — ABNORMAL LOW (ref 4.0–10.5)
nRBC: 0 % (ref 0.0–0.2)

## 2019-11-05 LAB — CMP (CANCER CENTER ONLY)
ALT: 26 U/L (ref 0–44)
AST: 32 U/L (ref 15–41)
Albumin: 3.7 g/dL (ref 3.5–5.0)
Alkaline Phosphatase: 93 U/L (ref 38–126)
Anion gap: 7 (ref 5–15)
BUN: 16 mg/dL (ref 8–23)
CO2: 28 mmol/L (ref 22–32)
Calcium: 8.9 mg/dL (ref 8.9–10.3)
Chloride: 105 mmol/L (ref 98–111)
Creatinine: 0.87 mg/dL (ref 0.61–1.24)
GFR, Est AFR Am: >60 mL/min (ref >60)
GFR, Estimated: >60 mL/min (ref >60)
Glucose, Bld: 103 mg/dL — ABNORMAL HIGH (ref 70–99)
Potassium: 4.9 mmol/L (ref 3.5–5.1)
Sodium: 140 mmol/L (ref 135–145)
Total Bilirubin: 0.6 mg/dL (ref 0.3–1.2)
Total Protein: 6.6 g/dL (ref 6.5–8.1)

## 2019-11-05 LAB — TOTAL PROTEIN, URINE DIPSTICK: Protein, ur: NEGATIVE mg/dL

## 2019-11-05 MED ORDER — ATROPINE SULFATE 0.4 MG/ML IJ SOLN
INTRAMUSCULAR | Status: AC
  Filled 2019-11-05: qty 1

## 2019-11-05 MED ORDER — PALONOSETRON HCL INJECTION 0.25 MG/5ML
INTRAVENOUS | Status: AC
  Filled 2019-11-05: qty 5

## 2019-11-05 MED ORDER — ATROPINE SULFATE 0.4 MG/ML IJ SOLN
0.4000 mg | Freq: Once | INTRAMUSCULAR | Status: AC
  Administered 2019-11-05: 0.4 mg via INTRAVENOUS

## 2019-11-05 MED ORDER — SODIUM CHLORIDE 0.9 % IV SOLN
5.0000 mg/kg | Freq: Once | INTRAVENOUS | Status: AC
  Administered 2019-11-05: 500 mg via INTRAVENOUS
  Filled 2019-11-05: qty 4

## 2019-11-05 MED ORDER — SODIUM CHLORIDE 0.9 % IV SOLN
Freq: Once | INTRAVENOUS | Status: AC
  Filled 2019-11-05: qty 250

## 2019-11-05 MED ORDER — SODIUM CHLORIDE 0.9 % IV SOLN
400.0000 mg/m2 | Freq: Once | INTRAVENOUS | Status: AC
  Administered 2019-11-05: 928 mg via INTRAVENOUS
  Filled 2019-11-05: qty 46.4

## 2019-11-05 MED ORDER — SODIUM CHLORIDE 0.9 % IV SOLN
2000.0000 mg/m2 | INTRAVENOUS | Status: DC
  Administered 2019-11-05: 4650 mg via INTRAVENOUS
  Filled 2019-11-05: qty 93

## 2019-11-05 MED ORDER — ATROPINE SULFATE 1 MG/ML IJ SOLN: 0.5000 mg | Freq: Once | INTRAMUSCULAR | Status: DC | PRN

## 2019-11-05 MED ORDER — SODIUM CHLORIDE 0.9% FLUSH
10.0000 mL | INTRAVENOUS | Status: DC | PRN
  Administered 2019-11-05: 10 mL
  Filled 2019-11-05: qty 10

## 2019-11-05 MED ORDER — PALONOSETRON HCL INJECTION 0.25 MG/5ML
0.2500 mg | Freq: Once | INTRAVENOUS | Status: AC
  Administered 2019-11-05: 0.25 mg via INTRAVENOUS

## 2019-11-05 MED ORDER — SODIUM CHLORIDE 0.9 % IV SOLN
10.0000 mg | Freq: Once | INTRAVENOUS | Status: AC
  Administered 2019-11-05: 10 mg via INTRAVENOUS
  Filled 2019-11-05: qty 10

## 2019-11-05 MED ORDER — SODIUM CHLORIDE 0.9 % IV SOLN
176.0000 mg/m2 | Freq: Once | INTRAVENOUS | Status: AC
  Administered 2019-11-05: 400 mg via INTRAVENOUS
  Filled 2019-11-05: qty 15
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
## 2019-11-05 NOTE — Patient Instructions (Signed)
Please provide a copy of your Medical Advanced Directive to have scanned into your record 

## 2019-11-05 NOTE — Patient Instructions (Signed)
[facility] Discharge Instructions for Patients Receiving Chemotherapy  Today you received the following chemotherapy agents: Bevacizumab, Irinotecan, Leucovorin and Adrucil.  To help prevent nausea and vomiting after your treatment, we encourage you to take your nausea medication as directed.    If you develop nausea and vomiting that is not controlled by your nausea medication, call the clinic.   BELOW ARE SYMPTOMS THAT SHOULD BE REPORTED IMMEDIATELY:  *FEVER GREATER THAN 100.5 F  *CHILLS WITH OR WITHOUT FEVER  NAUSEA AND VOMITING THAT IS NOT CONTROLLED WITH YOUR NAUSEA MEDICATION  *UNUSUAL SHORTNESS OF BREATH  *UNUSUAL BRUISING OR BLEEDING  TENDERNESS IN MOUTH AND THROAT WITH OR WITHOUT PRESENCE OF ULCERS  *URINARY PROBLEMS  *BOWEL PROBLEMS  UNUSUAL RASH Items with * indicate a potential emergency and should be followed up as soon as possible.  Feel free to call the clinic should you have any questions or concerns. The clinic phone number is [phone].  Please show the CHEMO ALERT CARD at check-in to the Emergency Department and triage nurse.   

## 2019-11-07 ENCOUNTER — Inpatient Hospital Stay: Payer: Medicare PPO

## 2019-11-07 ENCOUNTER — Other Ambulatory Visit: Payer: Self-pay

## 2019-11-07 DIAGNOSIS — C787 Secondary malignant neoplasm of liver and intrahepatic bile duct: Secondary | ICD-10-CM | POA: Diagnosis not present

## 2019-11-07 DIAGNOSIS — Z5111 Encounter for antineoplastic chemotherapy: Secondary | ICD-10-CM | POA: Diagnosis not present

## 2019-11-07 DIAGNOSIS — C180 Malignant neoplasm of cecum: Secondary | ICD-10-CM | POA: Diagnosis not present

## 2019-11-07 DIAGNOSIS — Z95828 Presence of other vascular implants and grafts: Secondary | ICD-10-CM

## 2019-11-07 DIAGNOSIS — Z5112 Encounter for antineoplastic immunotherapy: Secondary | ICD-10-CM | POA: Diagnosis not present

## 2019-11-07 DIAGNOSIS — Z79899 Other long term (current) drug therapy: Secondary | ICD-10-CM | POA: Diagnosis not present

## 2019-11-07 MED ORDER — SODIUM CHLORIDE 0.9% FLUSH
10.0000 mL | INTRAVENOUS | Status: DC | PRN
  Administered 2019-11-07: 10 mL
  Filled 2019-11-07: qty 10

## 2019-11-07 MED ORDER — HEPARIN SOD (PORK) LOCK FLUSH 100 UNIT/ML IV SOLN
500.0000 [IU] | Freq: Once | INTRAVENOUS | Status: AC | PRN
  Administered 2019-11-07: 500 [IU]
  Filled 2019-11-07: qty 5

## 2019-11-09 DIAGNOSIS — C180 Malignant neoplasm of cecum: Secondary | ICD-10-CM | POA: Diagnosis not present

## 2019-11-09 IMAGING — CT CT CHEST WITH CONTRAST
2 of 5 series · 12 of 36 positions shown, 15 images · IV contrast (omnipaque)
Comparison: 06/22/2017

CLINICAL DATA: Colon cancer. Rising CEA. Restaging. Prostate cancer
9 years ago. Right thoracotomy for sarcoidosis.

EXAM:
CT CHEST, ABDOMEN, AND PELVIS WITH CONTRAST
TECHNIQUE: Multidetector CT imaging of the chest, abdomen and pelvis was
performed following the standard protocol during bolus
administration of intravenous contrast.
CONTRAST:  100mL OMNIPAQUE IOHEXOL 300 MG/ML  SOLN

[Series 2: cap with · axial · 0.97mm/px · z∈[-600,-40]mm · 9 of 140 slices shown, 12 images]
[im 14/140  mediastinal]
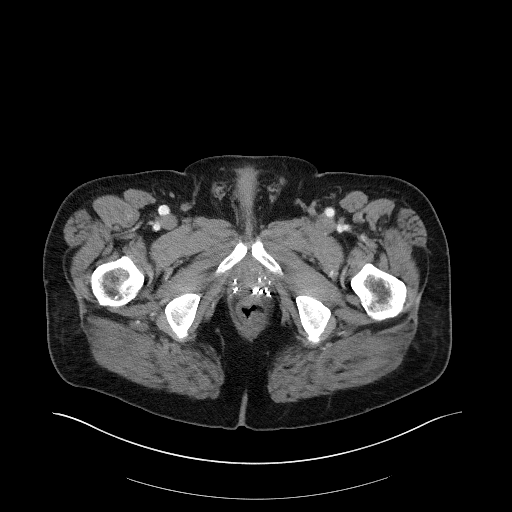
[im 14/140  lung]
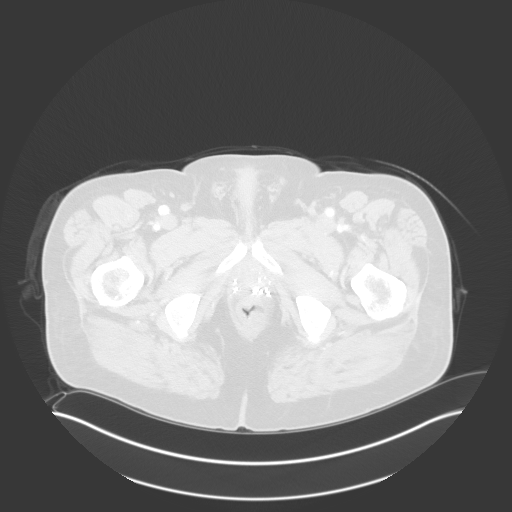
[im 28/140  lung]
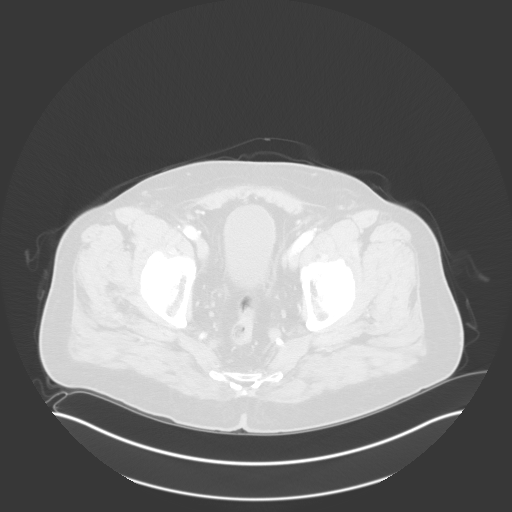
[im 42/140  lung]
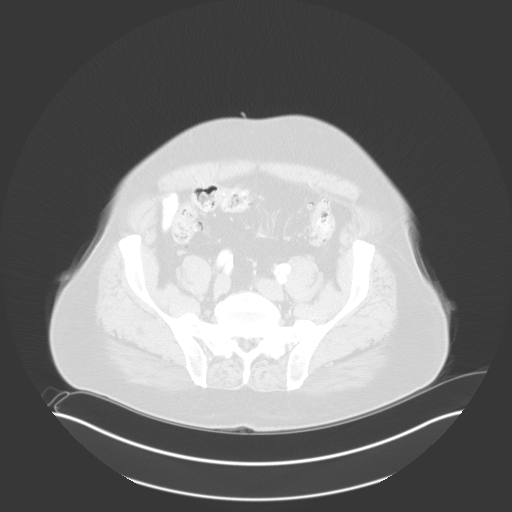
[im 56/140  lung]
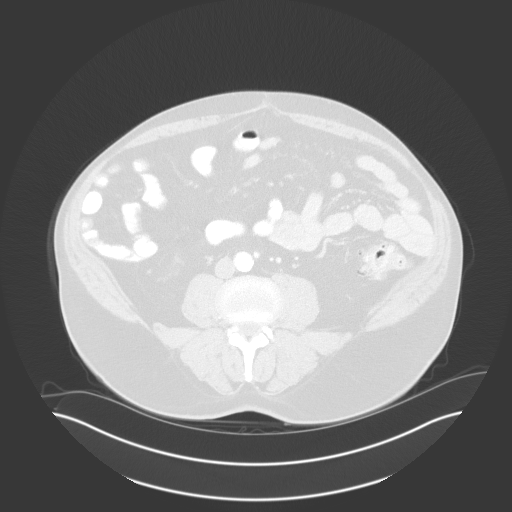
[im 70/140  mediastinal]
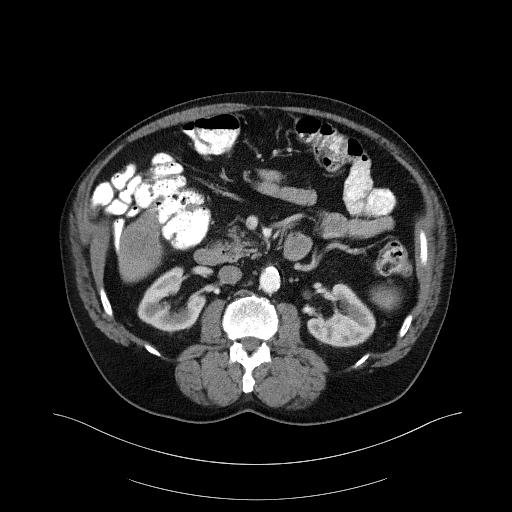
[im 70/140  lung]
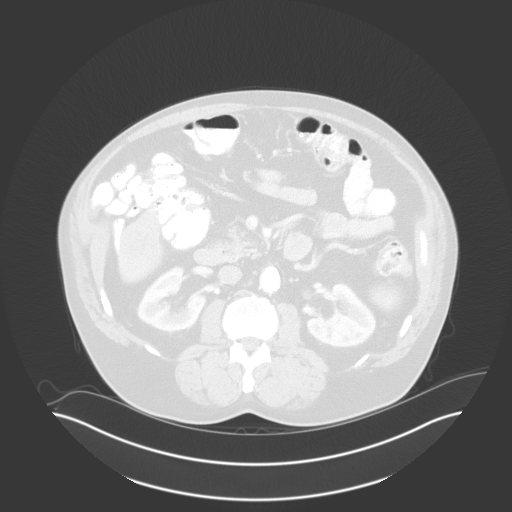
[im 84/140  lung]
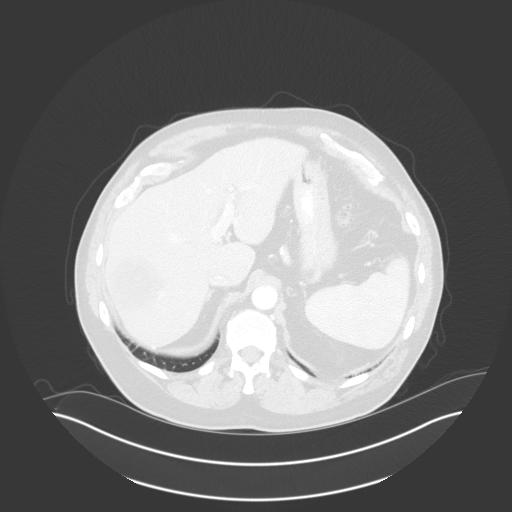
[im 98/140  lung]
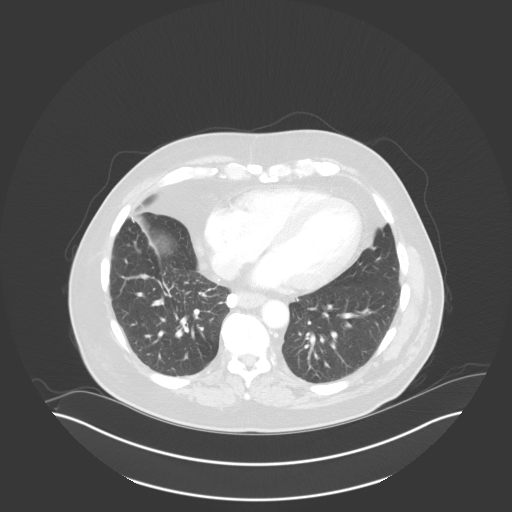
[im 112/140  lung]
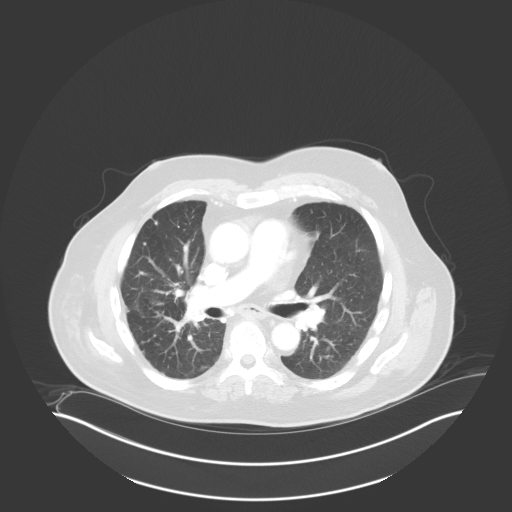
[im 126/140  mediastinal]
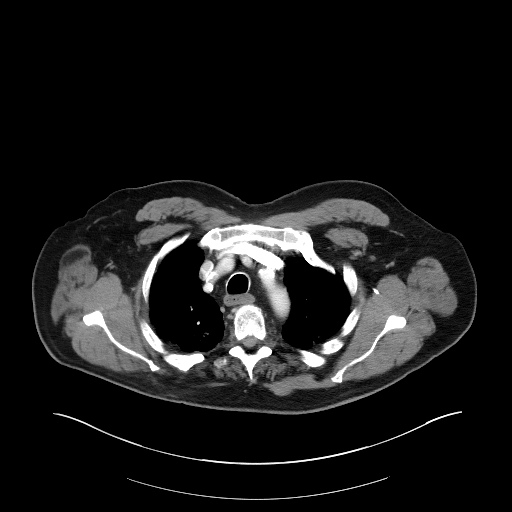
[im 126/140  lung]
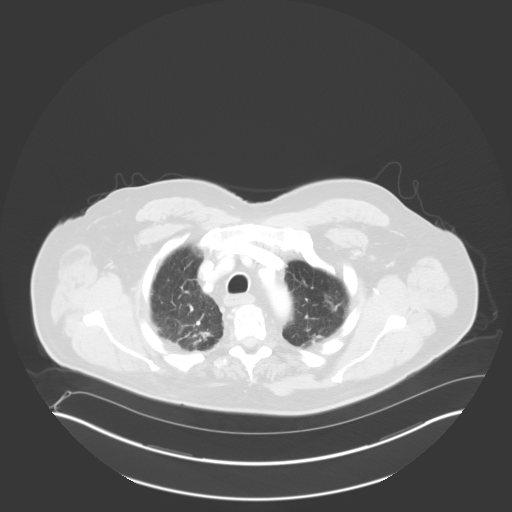

[Series 5: coronals · coronal · 0.90mm/px · 3 of 175 slices shown]
[im 35/175  lung]
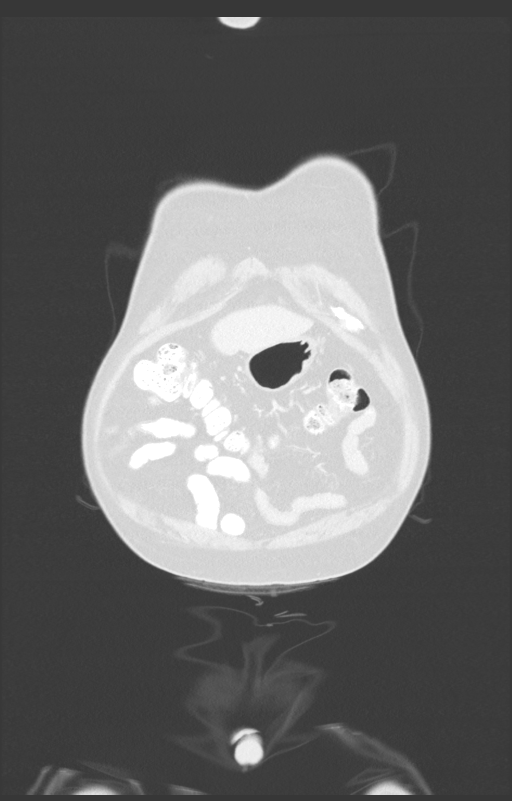
[im 70/175  lung]
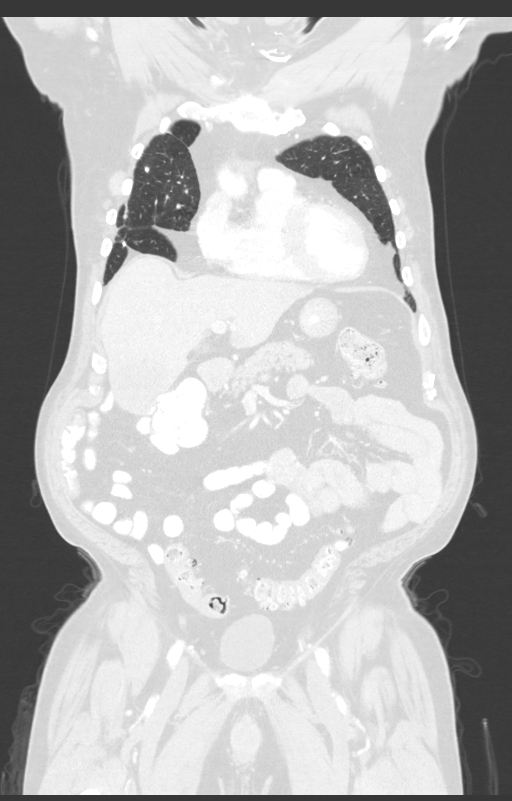
[im 105/175  lung]
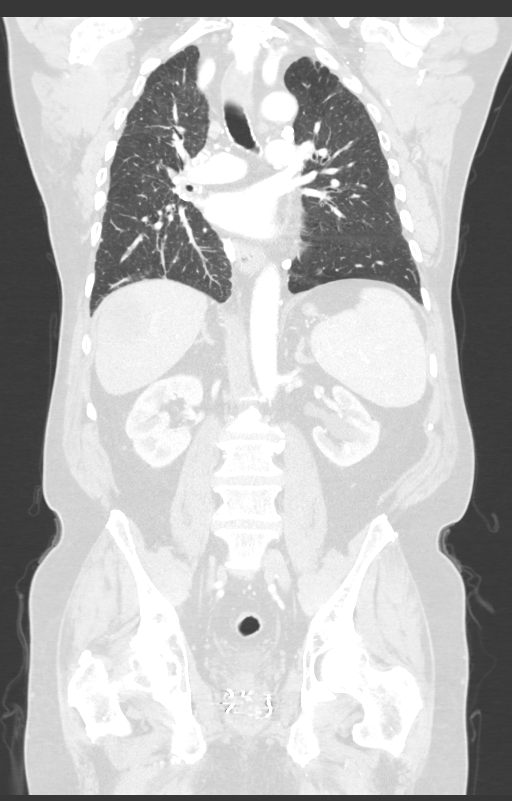

[12 of 36 positions shown; findings below may reference images not displayed]

FINDINGS: CT CHEST FINDINGS

Cardiovascular: Aortic atherosclerosis. Tortuous thoracic aorta.
Mild cardiomegaly, without pericardial effusion. Dense coronary
artery atherosclerosis. No central pulmonary embolism, on this
non-dedicated study.

Mediastinum/Nodes: No supraclavicular adenopathy. Calcified
mediastinal and hilar nodes are likely related to the clinical
history of sarcoidosis.

Lungs/Pleura: No pleural fluid. Right upper lobe wedge resection.
Bilateral multiple calcified granulomas.

Mild progression of upper lobe predominant areas of
peribronchovascular nodularity, architectural distortion, and
consolidation. The most masslike is in the posterior right upper
lobe at image 50/4. Given this underlying diffuse process, no
convincing evidence of pulmonary metastasis.

Musculoskeletal: No acute osseous abnormality. Right thoracotomy
changes.

CT ABDOMEN PELVIS FINDINGS

Hepatobiliary: Development of bilateral hepatic metastasis. Segment
[DATE] 5.4 x 5.2 cm lesion image 54/2.

Segment 6 mass measures 5.8 x 5.6 cm on 66/2.

Subtle segment 2 lesion measures 3.8 x 3.4 cm on 50/2.

Cholecystectomy, without biliary ductal dilatation.

Pancreas: Fatty replacement valve in the head and uncinate process.

Spleen: Normal in size, without focal abnormality. Normal in size,
without focal abnormality.

Adrenals/Urinary Tract: Normal adrenal glands. Inter and lower pole
left larger than right renal sinus cysts. Subcentimeter upper pole
right renal lesion is too small to characterize but favored to
represent a cyst or tiny angiomyolipoma. No hydronephrosis. Normal
urinary bladder.

Stomach/Bowel: Normal stomach, without wall thickening. Tiny
periampullary duodenal diverticulum. Otherwise normal small bowel.

Extensive colonic diverticulosis. Partial right hemicolectomy. No
locally recurrent disease.

Vascular/Lymphatic: Aortic and branch vessel atherosclerosis. No
abdominopelvic adenopathy.

Reproductive: Radiation seeds in the prostate.

Other: No significant free fluid.

Musculoskeletal: Left superior pubic ramus sclerotic lesion is
similar and likely a bone island. Partial degenerative fusion of the
bilateral sacroiliac joints.
IMPRESSION: 1. Development of bilateral hepatic metastasis.
2. No extrahepatic metastatic disease in the abdomen or pelvis.
3. Mild progression of sarcoidosis since 06/22/2017. Given the
underlying chronic findings, no evidence of thoracic metastasis.
4. Coronary artery atherosclerosis. Aortic Atherosclerosis
([id number]).

## 2019-11-19 ENCOUNTER — Inpatient Hospital Stay: Payer: Medicare PPO | Attending: Nurse Practitioner | Admitting: Oncology

## 2019-11-19 ENCOUNTER — Inpatient Hospital Stay: Payer: Medicare PPO

## 2019-11-19 ENCOUNTER — Other Ambulatory Visit: Payer: Self-pay

## 2019-11-19 VITALS — BP 122/65 | HR 60 | Temp 98.1°F | Resp 18 | Ht 71.0 in | Wt 230.5 lb

## 2019-11-19 DIAGNOSIS — C787 Secondary malignant neoplasm of liver and intrahepatic bile duct: Secondary | ICD-10-CM | POA: Diagnosis not present

## 2019-11-19 DIAGNOSIS — Z5111 Encounter for antineoplastic chemotherapy: Secondary | ICD-10-CM | POA: Insufficient documentation

## 2019-11-19 DIAGNOSIS — Z5112 Encounter for antineoplastic immunotherapy: Secondary | ICD-10-CM | POA: Insufficient documentation

## 2019-11-19 DIAGNOSIS — Z95828 Presence of other vascular implants and grafts: Secondary | ICD-10-CM

## 2019-11-19 DIAGNOSIS — C180 Malignant neoplasm of cecum: Secondary | ICD-10-CM

## 2019-11-19 DIAGNOSIS — Z79899 Other long term (current) drug therapy: Secondary | ICD-10-CM | POA: Diagnosis not present

## 2019-11-19 LAB — CMP (CANCER CENTER ONLY)
ALT: 27 U/L (ref 0–44)
AST: 29 U/L (ref 15–41)
Albumin: 3.6 g/dL (ref 3.5–5.0)
Alkaline Phosphatase: 100 U/L (ref 38–126)
Anion gap: 11 (ref 5–15)
BUN: 17 mg/dL (ref 8–23)
CO2: 24 mmol/L (ref 22–32)
Calcium: 8.8 mg/dL — ABNORMAL LOW (ref 8.9–10.3)
Chloride: 106 mmol/L (ref 98–111)
Creatinine: 0.95 mg/dL (ref 0.61–1.24)
GFR, Est AFR Am: >60 mL/min (ref >60)
GFR, Estimated: >60 mL/min (ref >60)
Glucose, Bld: 109 mg/dL — ABNORMAL HIGH (ref 70–99)
Potassium: 4.4 mmol/L (ref 3.5–5.1)
Sodium: 141 mmol/L (ref 135–145)
Total Bilirubin: 0.5 mg/dL (ref 0.3–1.2)
Total Protein: 6.4 g/dL — ABNORMAL LOW (ref 6.5–8.1)

## 2019-11-19 LAB — CBC WITH DIFFERENTIAL (CANCER CENTER ONLY)
Abs Immature Granulocytes: 0.01 10*3/uL (ref 0.00–0.07)
Basophils Absolute: 0 10*3/uL (ref 0.0–0.1)
Basophils Relative: 1 %
Eosinophils Absolute: 0.2 10*3/uL (ref 0.0–0.5)
Eosinophils Relative: 5 %
HCT: 35.4 % — ABNORMAL LOW (ref 39.0–52.0)
Hemoglobin: 11.9 g/dL — ABNORMAL LOW (ref 13.0–17.0)
Immature Granulocytes: 0 %
Lymphocytes Relative: 10 %
Lymphs Abs: 0.4 10*3/uL — ABNORMAL LOW (ref 0.7–4.0)
MCH: 31.9 pg (ref 26.0–34.0)
MCHC: 33.6 g/dL (ref 30.0–36.0)
MCV: 94.9 fL (ref 80.0–100.0)
Monocytes Absolute: 0.4 10*3/uL (ref 0.1–1.0)
Monocytes Relative: 11 %
Neutro Abs: 2.8 10*3/uL (ref 1.7–7.7)
Neutrophils Relative %: 73 %
Platelet Count: 113 10*3/uL — ABNORMAL LOW (ref 150–400)
RBC: 3.73 MIL/uL — ABNORMAL LOW (ref 4.22–5.81)
RDW: 14.6 % (ref 11.5–15.5)
WBC Count: 3.8 10*3/uL — ABNORMAL LOW (ref 4.0–10.5)
nRBC: 0 % (ref 0.0–0.2)

## 2019-11-19 LAB — CEA (IN HOUSE-CHCC): CEA (CHCC-In House): 3.02 ng/mL (ref 0.00–5.00)

## 2019-11-19 MED ORDER — SODIUM CHLORIDE 0.9% FLUSH
10.0000 mL | INTRAVENOUS | Status: DC | PRN
  Administered 2019-11-19: 10 mL
  Filled 2019-11-19: qty 10

## 2019-11-19 MED ORDER — SODIUM CHLORIDE 0.9 % IV SOLN
5.0000 mg/kg | Freq: Once | INTRAVENOUS | Status: AC
  Administered 2019-11-19: 500 mg via INTRAVENOUS
  Filled 2019-11-19: qty 16

## 2019-11-19 MED ORDER — PALONOSETRON HCL INJECTION 0.25 MG/5ML
INTRAVENOUS | Status: AC
  Filled 2019-11-19: qty 5

## 2019-11-19 MED ORDER — ATROPINE SULFATE 1 MG/ML IJ SOLN
INTRAMUSCULAR | Status: AC
  Filled 2019-11-19: qty 1

## 2019-11-19 MED ORDER — SODIUM CHLORIDE 0.9 % IV SOLN
Freq: Once | INTRAVENOUS | Status: AC
  Filled 2019-11-19: qty 250

## 2019-11-19 MED ORDER — SODIUM CHLORIDE 0.9 % IV SOLN
400.0000 mg/m2 | Freq: Once | INTRAVENOUS | Status: AC
  Administered 2019-11-19: 928 mg via INTRAVENOUS
  Filled 2019-11-19: qty 46.4

## 2019-11-19 MED ORDER — SODIUM CHLORIDE 0.9% FLUSH
10.0000 mL | INTRAVENOUS | Status: DC | PRN
  Filled 2019-11-19: qty 10

## 2019-11-19 MED ORDER — SODIUM CHLORIDE 0.9 % IV SOLN
176.0000 mg/m2 | Freq: Once | INTRAVENOUS | Status: AC
  Administered 2019-11-19: 400 mg via INTRAVENOUS
  Filled 2019-11-19: qty 5

## 2019-11-19 MED ORDER — PALONOSETRON HCL INJECTION 0.25 MG/5ML
0.2500 mg | Freq: Once | INTRAVENOUS | Status: AC
  Administered 2019-11-19: 0.25 mg via INTRAVENOUS

## 2019-11-19 MED ORDER — HEPARIN SOD (PORK) LOCK FLUSH 100 UNIT/ML IV SOLN
500.0000 [IU] | Freq: Once | INTRAVENOUS | Status: DC | PRN
  Filled 2019-11-19: qty 5

## 2019-11-19 MED ORDER — SODIUM CHLORIDE 0.9 % IV SOLN
10.0000 mg | Freq: Once | INTRAVENOUS | Status: AC
  Administered 2019-11-19: 10 mg via INTRAVENOUS
  Filled 2019-11-19: qty 10

## 2019-11-19 MED ORDER — ATROPINE SULFATE 1 MG/ML IJ SOLN
0.5000 mg | Freq: Once | INTRAMUSCULAR | Status: AC | PRN
  Administered 2019-11-19: 0.5 mg via INTRAVENOUS

## 2019-11-19 MED ORDER — SODIUM CHLORIDE 0.9 % IV SOLN
2000.0000 mg/m2 | INTRAVENOUS | Status: DC
  Administered 2019-11-19: 4650 mg via INTRAVENOUS
  Filled 2019-11-19: qty 93

## 2019-11-19 NOTE — Progress Notes (Signed)
[place] OFFICE PROGRESS NOTE   Diagnosis: Colon cancer  INTERVAL HISTORY:   [name] returns as scheduled.  He completed another cycle of FOLFIRI on 11/05/2019.  He developed 1 ulcer at the anterior left buccal mucosa.  This has resolved.  No nausea/vomiting or diarrhea.  He has constipation, relieved with Dulcolax and a stool softener.  He has discomfort with bowel movements when he is constipated. Good appetite.  He is exercising.  He reports stable neuropathy symptoms in the hands and feet.  This does not interfere with activity.  Objective:  Vital signs in last 24 hours:  Blood pressure 122/65, pulse 60, temperature 98.1 F (36.7 C), temperature source Temporal, resp. rate 18, height '5\' 11"'$  (1.803 m), weight 230 lb 8 oz (104.6 kg), SpO2 96 %.    HEENT: No thrush, 1 mm healing ulcer at the anterior left buccal mucosa near the angle of the lips Resp: Lungs clear bilaterally Cardio: Regular rate and rhythm GI: No hepatosplenomegaly, nontender Vascular: Trace ankle edema bilaterally  Skin: Palms without erythema  Portacath/PICC-without erythema  Lab Results:  Lab Results  Component Value Date   WBC 3.8 (L) 11/19/2019   HGB 11.9 (L) 11/19/2019   HCT 35.4 (L) 11/19/2019   MCV 94.9 11/19/2019   PLT 113 (L) 11/19/2019   NEUTROABS 2.8 11/19/2019    CMP  Lab Results  Component Value Date   NA 140 11/05/2019   K 4.9 11/05/2019   CL 105 11/05/2019   CO2 28 11/05/2019   GLUCOSE 103 (H) 11/05/2019   BUN 16 11/05/2019   CREATININE 0.87 11/05/2019   CALCIUM 8.9 11/05/2019   PROT 6.6 11/05/2019   ALBUMIN 3.7 11/05/2019   AST 32 11/05/2019   ALT 26 11/05/2019   ALKPHOS 93 11/05/2019   BILITOT 0.6 11/05/2019   GFRNONAA >60 11/05/2019   GFRAA >60 11/05/2019    Lab Results  Component Value Date   CEA1 4.89 09/17/2019    Medications: I have reviewed the patient's current medications.   Assessment/Plan: 1. Adenocarcinoma of the cecum, stage  IIA(T3N0), status post a right colectomy 07/26/2017 ? 0/16 lymph nodes positive, no lymphovascular invasion, perineural invasion present ? MSI-stable, no loss of mismatch repair protein expression ? Surveillance colonoscopy 07/26/2018-no polyps found ? Elevated CEA June 2020, persistently elevated August 2020 ? CTs 02/07/2019-bilateral liver metastases, no extrahepatic metastatic disease, changes of sarcoidosis in the chest ? Biopsy liver lesion 03/04/2019-metastatic adenocarcinoma, with morphology consistent with metastatic colonic adenocarcinoma, RAS WT ? Cycle 1 FOLFOX 03/12/2019 ? Cycle 2 FOLFOX 03/26/2019, oxaliplatin dose reduced secondary to thrombocytopenia ? Cycle 3 FOLFOX 04/09/2019, oxaliplatin further dose reduced and aspirin held secondary to thrombocytopenia ? Cycle 4 FOLFOX 04/23/2019 ? Cycle 5 FOLFOX 05/07/2019 ? CTs 05/23/2019-decrease in liver lesions. No new or progressive findings. ? Cycle 6 FOLFOX 05/27/2019 ? Cycle 7 FOLFOX 06/14/2019 ? Cycle 8 FOLFOX 07/02/2019-Udenyca held ? Cycle 9 FOLFOX 07/16/2019-Udenyca held ? Cycle 10 FOLFOX 07/30/2019-Udenyca held ? CT abdomen/pelvis 08/13/2019-mild improvement in previously noted liver lesions ? MRI abdomen 08/13/2019-3 liver lesions  ? Cycle 1 FOLFIRI/Avastin 09/17/2019 ? Cycle 2 FOLFIRI/Avastin 10/01/2019 ? Cycle 3 FOLFIRI/Avastin 10/15/2019 ? Cycle 4 FOLFIRI/Avastin 11/05/2019 ? Cycle 5 FOLFIRI/Avastin 11/19/2019 2. History of colon polyps 3. Microcytic anemia January 2019 4. Sarcoidosis 5. Coronary artery disease 6. Port-A-Cath placement, interventional radiology, 03/04/2019 7. Thrombocytopenia, mild thrombocytopenia predating chemotherapy 8. Oxaliplatin neuropathy      Disposition: Luis [name] appears stable.  He continues to tolerate the chemotherapy well.  He will complete another cycle of FOLFIRI/Avastin today.  He will return for an office visit and chemotherapy in 2 weeks.  He will undergo a restaging CT prior to the next  cycle of chemotherapy.  He will call for bleeding or bruising.  Betsy Coder, MD  11/19/2019  8:41 AM

## 2019-11-19 NOTE — Patient Instructions (Signed)
[place] Discharge Instructions for Patients Receiving Chemotherapy  Today you received the following chemotherapy agents: Bevacizumab, Irinotecan, Leucovorin and Adrucil.  To help prevent nausea and vomiting after your treatment, we encourage you to take your nausea medication as directed.    If you develop nausea and vomiting that is not controlled by your nausea medication, call the clinic.   BELOW ARE SYMPTOMS THAT SHOULD BE REPORTED IMMEDIATELY:  *FEVER GREATER THAN 100.5 F  *CHILLS WITH OR WITHOUT FEVER  NAUSEA AND VOMITING THAT IS NOT CONTROLLED WITH YOUR NAUSEA MEDICATION  *UNUSUAL SHORTNESS OF BREATH  *UNUSUAL BRUISING OR BLEEDING  TENDERNESS IN MOUTH AND THROAT WITH OR WITHOUT PRESENCE OF ULCERS  *URINARY PROBLEMS  *BOWEL PROBLEMS  UNUSUAL RASH Items with * indicate a potential emergency and should be followed up as soon as possible.  Feel free to call the clinic should you have any questions or concerns. The clinic phone number is [phone].  Please show the [place] at check-in to the Emergency Department and triage nurse.

## 2019-11-21 ENCOUNTER — Inpatient Hospital Stay: Payer: Medicare PPO

## 2019-11-21 ENCOUNTER — Other Ambulatory Visit: Payer: Self-pay

## 2019-11-21 VITALS — BP 122/72 | HR 72 | Temp 98.2°F | Resp 18

## 2019-11-21 DIAGNOSIS — C180 Malignant neoplasm of cecum: Secondary | ICD-10-CM | POA: Diagnosis not present

## 2019-11-21 DIAGNOSIS — C787 Secondary malignant neoplasm of liver and intrahepatic bile duct: Secondary | ICD-10-CM | POA: Diagnosis not present

## 2019-11-21 DIAGNOSIS — Z79899 Other long term (current) drug therapy: Secondary | ICD-10-CM | POA: Diagnosis not present

## 2019-11-21 DIAGNOSIS — Z5111 Encounter for antineoplastic chemotherapy: Secondary | ICD-10-CM | POA: Diagnosis not present

## 2019-11-21 DIAGNOSIS — Z5112 Encounter for antineoplastic immunotherapy: Secondary | ICD-10-CM | POA: Diagnosis not present

## 2019-11-21 MED ORDER — HEPARIN SOD (PORK) LOCK FLUSH 100 UNIT/ML IV SOLN
500.0000 [IU] | Freq: Once | INTRAVENOUS | Status: AC | PRN
  Administered 2019-11-21: 500 [IU]
  Filled 2019-11-21: qty 5

## 2019-11-21 MED ORDER — SODIUM CHLORIDE 0.9% FLUSH
10.0000 mL | INTRAVENOUS | Status: DC | PRN
  Administered 2019-11-21: 10 mL
  Filled 2019-11-21: qty 10

## 2019-11-26 ENCOUNTER — Telehealth: Payer: Self-pay | Admitting: *Deleted

## 2019-11-26 NOTE — Telephone Encounter (Signed)
Called to report he voided during the night and noted hematuria--no clots. No further hematuria today after several voids. No pain w/urination or fever. Has also on occasion small amount of blood in stool w/BM, but admits to some constipation after tx. Instructed him to push fluids and monitor. Per Dr. Benay Spice, call for increased bleeding. Called patient w/MD response and he reports he had soft BM today and saw blood on tissue when he wiped. States he has been told he has internal hemorrhoids. Suggested he obtain Preparation H suppository or ointment and insert in rectum 1-2 week to see if this helps.

## 2019-11-27 ENCOUNTER — Other Ambulatory Visit: Payer: Self-pay

## 2019-11-27 ENCOUNTER — Encounter (HOSPITAL_COMMUNITY): Payer: Self-pay

## 2019-11-27 ENCOUNTER — Ambulatory Visit (HOSPITAL_COMMUNITY)
Admission: RE | Admit: 2019-11-27 | Discharge: 2019-11-27 | Disposition: A | Discharge status: Home / Self Care | Payer: Medicare PPO | Source: Ambulatory Visit | Attending: Oncology | Admitting: Oncology

## 2019-11-27 DIAGNOSIS — K5730 Diverticulosis of large intestine without perforation or abscess without bleeding: Secondary | ICD-10-CM | POA: Diagnosis not present

## 2019-11-27 DIAGNOSIS — C787 Secondary malignant neoplasm of liver and intrahepatic bile duct: Secondary | ICD-10-CM | POA: Diagnosis not present

## 2019-11-27 DIAGNOSIS — C180 Malignant neoplasm of cecum: Secondary | ICD-10-CM | POA: Diagnosis not present

## 2019-11-27 DIAGNOSIS — C189 Malignant neoplasm of colon, unspecified: Secondary | ICD-10-CM | POA: Diagnosis not present

## 2019-11-27 MED ORDER — SODIUM CHLORIDE (PF) 0.9 % IJ SOLN
INTRAMUSCULAR | Status: AC
  Filled 2019-11-27: qty 50

## 2019-11-27 MED ORDER — IOHEXOL 300 MG/ML  SOLN
100.0000 mL | Freq: Once | INTRAMUSCULAR | Status: AC | PRN
  Administered 2019-11-27: 100 mL via INTRAVENOUS

## 2019-11-29 ENCOUNTER — Other Ambulatory Visit: Payer: Self-pay | Admitting: Oncology

## 2019-12-01 ENCOUNTER — Telehealth: Payer: Self-pay | Admitting: *Deleted

## 2019-12-01 NOTE — Telephone Encounter (Signed)
Called patient w/CT results and to f/u as scheduled. MD suggests to continue current treatment. He was hoping to go on maintenance therapy. Suggested he discuss all his concerns at next visit.

## 2019-12-01 NOTE — Telephone Encounter (Signed)
-----   Message from Ladell Pier, MD sent at 11/28/2019  2:00 PM EDT ----- Please call patient, CT shows stable liver lesions, and no evidence of new metastases, plan to continue FOLFIRI/Avastin, follow-up as scheduled

## 2019-12-03 ENCOUNTER — Inpatient Hospital Stay: Payer: Medicare PPO | Admitting: Nurse Practitioner

## 2019-12-03 ENCOUNTER — Inpatient Hospital Stay: Payer: Medicare PPO

## 2019-12-03 ENCOUNTER — Other Ambulatory Visit: Payer: Self-pay

## 2019-12-03 VITALS — BP 127/56 | HR 67 | Temp 97.8°F | Resp 18 | Wt 231.5 lb

## 2019-12-03 DIAGNOSIS — C180 Malignant neoplasm of cecum: Secondary | ICD-10-CM

## 2019-12-03 DIAGNOSIS — Z79899 Other long term (current) drug therapy: Secondary | ICD-10-CM | POA: Diagnosis not present

## 2019-12-03 DIAGNOSIS — Z95828 Presence of other vascular implants and grafts: Secondary | ICD-10-CM

## 2019-12-03 DIAGNOSIS — C787 Secondary malignant neoplasm of liver and intrahepatic bile duct: Secondary | ICD-10-CM | POA: Diagnosis not present

## 2019-12-03 DIAGNOSIS — Z5111 Encounter for antineoplastic chemotherapy: Secondary | ICD-10-CM | POA: Diagnosis not present

## 2019-12-03 DIAGNOSIS — Z5112 Encounter for antineoplastic immunotherapy: Secondary | ICD-10-CM | POA: Diagnosis not present

## 2019-12-03 LAB — CBC WITH DIFFERENTIAL (CANCER CENTER ONLY)
Abs Immature Granulocytes: 0.01 10*3/uL (ref 0.00–0.07)
Basophils Absolute: 0 10*3/uL (ref 0.0–0.1)
Basophils Relative: 0 %
Eosinophils Absolute: 0.2 10*3/uL (ref 0.0–0.5)
Eosinophils Relative: 5 %
HCT: 35 % — ABNORMAL LOW (ref 39.0–52.0)
Hemoglobin: 11.8 g/dL — ABNORMAL LOW (ref 13.0–17.0)
Immature Granulocytes: 0 %
Lymphocytes Relative: 12 %
Lymphs Abs: 0.4 10*3/uL — ABNORMAL LOW (ref 0.7–4.0)
MCH: 31.9 pg (ref 26.0–34.0)
MCHC: 33.7 g/dL (ref 30.0–36.0)
MCV: 94.6 fL (ref 80.0–100.0)
Monocytes Absolute: 0.4 10*3/uL (ref 0.1–1.0)
Monocytes Relative: 11 %
Neutro Abs: 2.3 10*3/uL (ref 1.7–7.7)
Neutrophils Relative %: 72 %
Platelet Count: 113 10*3/uL — ABNORMAL LOW (ref 150–400)
RBC: 3.7 MIL/uL — ABNORMAL LOW (ref 4.22–5.81)
RDW: 15.4 % (ref 11.5–15.5)
WBC Count: 3.3 10*3/uL — ABNORMAL LOW (ref 4.0–10.5)
nRBC: 0 % (ref 0.0–0.2)

## 2019-12-03 LAB — CMP (CANCER CENTER ONLY)
ALT: 35 U/L (ref 0–44)
AST: 36 U/L (ref 15–41)
Albumin: 3.7 g/dL (ref 3.5–5.0)
Alkaline Phosphatase: 86 U/L (ref 38–126)
Anion gap: 6 (ref 5–15)
BUN: 15 mg/dL (ref 8–23)
CO2: 25 mmol/L (ref 22–32)
Calcium: 8.7 mg/dL — ABNORMAL LOW (ref 8.9–10.3)
Chloride: 105 mmol/L (ref 98–111)
Creatinine: 0.98 mg/dL (ref 0.61–1.24)
GFR, Est AFR Am: >60 mL/min (ref >60)
GFR, Estimated: >60 mL/min (ref >60)
Glucose, Bld: 113 mg/dL — ABNORMAL HIGH (ref 70–99)
Potassium: 4.6 mmol/L (ref 3.5–5.1)
Sodium: 136 mmol/L (ref 135–145)
Total Bilirubin: 0.8 mg/dL (ref 0.3–1.2)
Total Protein: 6.4 g/dL — ABNORMAL LOW (ref 6.5–8.1)

## 2019-12-03 LAB — CEA (IN HOUSE-CHCC): CEA (CHCC-In House): 2.59 ng/mL (ref 0.00–5.00)

## 2019-12-03 MED ORDER — PALONOSETRON HCL INJECTION 0.25 MG/5ML
INTRAVENOUS | Status: AC
  Filled 2019-12-03: qty 5

## 2019-12-03 MED ORDER — PALONOSETRON HCL INJECTION 0.25 MG/5ML
0.2500 mg | Freq: Once | INTRAVENOUS | Status: AC
  Administered 2019-12-03: 0.25 mg via INTRAVENOUS

## 2019-12-03 MED ORDER — ATROPINE SULFATE 1 MG/ML IJ SOLN
INTRAMUSCULAR | Status: AC
  Filled 2019-12-03: qty 1

## 2019-12-03 MED ORDER — SODIUM CHLORIDE 0.9 % IV SOLN
10.0000 mg | Freq: Once | INTRAVENOUS | Status: AC
  Administered 2019-12-03: 10 mg via INTRAVENOUS
  Filled 2019-12-03: qty 10

## 2019-12-03 MED ORDER — SODIUM CHLORIDE 0.9 % IV SOLN
Freq: Once | INTRAVENOUS | Status: AC
  Filled 2019-12-03: qty 250

## 2019-12-03 MED ORDER — SODIUM CHLORIDE 0.9 % IV SOLN
2000.0000 mg/m2 | INTRAVENOUS | Status: DC
  Administered 2019-12-03: 4650 mg via INTRAVENOUS
  Filled 2019-12-03: qty 93

## 2019-12-03 MED ORDER — SODIUM CHLORIDE 0.9 % IV SOLN
176.0000 mg/m2 | Freq: Once | INTRAVENOUS | Status: AC
  Administered 2019-12-03: 400 mg via INTRAVENOUS
  Filled 2019-12-03: qty 5

## 2019-12-03 MED ORDER — SODIUM CHLORIDE 0.9% FLUSH
10.0000 mL | INTRAVENOUS | Status: DC | PRN
  Administered 2019-12-03: 10 mL
  Filled 2019-12-03: qty 10

## 2019-12-03 MED ORDER — SODIUM CHLORIDE 0.9 % IV SOLN
400.0000 mg/m2 | Freq: Once | INTRAVENOUS | Status: AC
  Administered 2019-12-03: 928 mg via INTRAVENOUS
  Filled 2019-12-03: qty 46.4

## 2019-12-03 MED ORDER — SODIUM CHLORIDE 0.9 % IV SOLN
6.0000 mg/kg | Freq: Once | INTRAVENOUS | Status: AC
  Administered 2019-12-03: 600 mg via INTRAVENOUS
  Filled 2019-12-03: qty 10

## 2019-12-03 MED ORDER — ATROPINE SULFATE 1 MG/ML IJ SOLN
0.5000 mg | Freq: Once | INTRAMUSCULAR | Status: AC | PRN
  Administered 2019-12-03: 0.5 mg via INTRAVENOUS

## 2019-12-03 MED ORDER — DOXYCYCLINE HYCLATE 100 MG PO TABS: 100.0000 mg | ORAL_TABLET | Freq: Two times a day (BID) | ORAL | 4 refills | Status: DC

## 2019-12-03 NOTE — Progress Notes (Addendum)
[place] OFFICE PROGRESS NOTE   Diagnosis: Colon cancer  INTERVAL HISTORY:   Luis [name] returns as scheduled.  He completed cycle 5 FOLFIRI/Avastin 11/19/2019.  He denies nausea/vomiting.  He has single mouth sore which has resolved.  No diarrhea.  Some constipation with an episode of associated hemorrhoidal bleeding.  No other bleeding.  No fever or cough.  He has occasional dyspnea.  Objective:  Vital signs in last 24 hours:  Blood pressure (!) 127/56, pulse 67, temperature 97.8 F (36.6 C), temperature source Temporal, resp. rate 18, weight 231 lb 8 oz (105 kg), SpO2 97 %.    HEENT: No thrush or ulcers. Resp: Lungs clear bilaterally. Cardio: Regular rate and rhythm. GI: Abdomen soft and nontender.  No hepatomegaly. Vascular: No leg edema. Port-A-Cath without erythema.  Lab Results:  Lab Results  Component Value Date   WBC 3.3 (L) 12/03/2019   HGB 11.8 (L) 12/03/2019   HCT 35.0 (L) 12/03/2019   MCV 94.6 12/03/2019   PLT 113 (L) 12/03/2019   NEUTROABS 2.3 12/03/2019    Imaging: CT images 11/27/2019-reviewed Medications: I have reviewed the patient's current medications.  Assessment/Plan: 1. Adenocarcinoma of the cecum, stage IIA(T3N0), status post a right colectomy 07/26/2017 ? 0/16 lymph nodes positive, no lymphovascular invasion, perineural invasion present ? MSI-stable, no loss of mismatch repair protein expression ? Surveillance colonoscopy 07/26/2018-no polyps found ? Elevated CEA June 2020, persistently elevated August 2020 ? CTs 02/07/2019-bilateral liver metastases, no extrahepatic metastatic disease, changes of sarcoidosis in the chest ? Biopsy liver lesion 03/04/2019-metastatic adenocarcinoma, with morphology consistent with metastatic colonic adenocarcinoma, RAS WT ? Cycle 1 FOLFOX 03/12/2019 ? Cycle 2 FOLFOX 03/26/2019, oxaliplatin dose reduced secondary to thrombocytopenia ? Cycle 3 FOLFOX 04/09/2019, oxaliplatin further dose reduced and  aspirin held secondary to thrombocytopenia ? Cycle 4 FOLFOX 04/23/2019 ? Cycle 5 FOLFOX 05/07/2019 ? CTs 05/23/2019-decrease in liver lesions. No new or progressive findings. ? Cycle 6 FOLFOX 05/27/2019 ? Cycle 7 FOLFOX 06/14/2019 ? Cycle 8 FOLFOX 07/02/2019-Udenyca held ? Cycle 9 FOLFOX 07/16/2019-Udenyca held ? Cycle 10 FOLFOX 07/30/2019-Udenyca held ? CT abdomen/pelvis 08/13/2019-mild improvement in previously noted liver lesions ? MRI abdomen 08/13/2019-3 liver lesions ? Cycle 1 FOLFIRI/Avastin 09/17/2019 ? Cycle 2 FOLFIRI/Avastin 10/01/2019 ? Cycle 3 FOLFIRI/Avastin 10/15/2019 ? Cycle 4 FOLFIRI/Avastin 11/05/2019 ? Cycle 5 FOLFIRI/Avastin 11/19/2019 ? CTs 11/27/2019-stable liver lesions; no new liver lesions.  No other evidence of metastatic disease in the abdomen or pelvis. ? Cycle 1 FOLFIRI/Panitumumab 12/03/2019 2. History of colon polyps 3. Microcytic anemia January 2019 4. Sarcoidosis 5. Coronary artery disease 6. Port-A-Cath placement, interventional radiology, 03/04/2019 7. Thrombocytopenia, mild thrombocytopenia predating chemotherapy 8. Oxaliplatin neuropathy  Disposition: Luis [name] appears stable.  He has completed 5 cycles of FOLFIRI/Avastin.  Recent restaging CTs show stable disease in the liver, no new disease.  Dr. Benay Spice discussed options to include continuation of the current treatment on a 3-week schedule, maintenance therapy with Xeloda and continuation of Avastin, changing treatment to FOLFIRI/Panitumumab for 4 or 5 cycles in an attempt to shrink the liver lesions to see if he may then be a candidate for SBRT or liver ablation.  Luis [name] would like to change treatment to FOLFIRI/Panitumumab.  We reviewed potential toxicities associated with Panitumumab including allergic reaction, diarrhea, rash, paronychia.  He understands the rationale for beginning doxycycline.  A prescription was sent to his pharmacy.  Plan to proceed with cycle 1 FOLFIRI/Panitumumab today.  He will return for  lab, follow-up, cycle 2 FOLFIRI/Panitumumab in 2  weeks.  Patient seen with Dr. Benay Spice.    Ned Card ANP/GNP-BC   12/03/2019  10:28 AM This was a shared visit with Ned Card.  We reviewed the restaging CT findings with Luis [name].  He appears to have stable disease.  We discussed treatment options.  This included a discussion of continuing FOLFIRI/Avastin, a treatment break, maintenance therapy with 5-FU or Xeloda/Avastin, and switching to a different systemic regimen.  Luis [name] would like to continue treatment with the goal of decreasing the size of the liver lesions so that he may be a candidate for hepatic directed therapy.  I recommend discontinuing bevacizumab and adding Panitumumab.  We reviewed potential toxicities associated with Panitumumab.  He agrees to proceed.  He understands the decreased predicted response rate with Panitumumab in patients with right-sided colorectal cancer.   Julieanne Manson, MD

## 2019-12-03 NOTE — Patient Instructions (Signed)
[place] Discharge Instructions for Patients Receiving Chemotherapy  Today you received the following chemotherapy agents: panitumumab, irinotecan, leucovorin, and fluorouracil.  To help prevent nausea and vomiting after your treatment, we encourage you to take your nausea medication as directed.   If you develop nausea and vomiting that is not controlled by your nausea medication, call the clinic.   BELOW ARE SYMPTOMS THAT SHOULD BE REPORTED IMMEDIATELY:  *FEVER GREATER THAN 100.5 F  *CHILLS WITH OR WITHOUT FEVER  NAUSEA AND VOMITING THAT IS NOT CONTROLLED WITH YOUR NAUSEA MEDICATION  *UNUSUAL SHORTNESS OF BREATH  *UNUSUAL BRUISING OR BLEEDING  TENDERNESS IN MOUTH AND THROAT WITH OR WITHOUT PRESENCE OF ULCERS  *URINARY PROBLEMS  *BOWEL PROBLEMS  UNUSUAL RASH Items with * indicate a potential emergency and should be followed up as soon as possible.  Feel free to call the clinic should you have any questions or concerns. The clinic phone number is [phone].  Please show the [facility] at check-in to the Emergency Department and triage nurse.  Panitumumab Solution for Injection What is this medicine? PANITUMUMAB (pan i TOOM ue mab) is a monoclonal antibody. It is used to treat colorectal cancer. This medicine may be used for other purposes; ask your health care provider or pharmacist if you have questions. COMMON BRAND NAME(S): Vectibix What should I tell my health care provider before I take this medicine? They need to know if you have any of these conditions:  eye disease, vision problems  low levels of calcium, magnesium, or potassium in the blood  lung or breathing disease, like asthma  skin conditions or sensitivity  an unusual or allergic reaction to panitumumab, other medicines, foods, dyes, or preservatives  pregnant or trying to get pregnant  breast-feeding How should I use this medicine? This drug is given as an infusion  into a vein. It is administered in a hospital or clinic by a specially trained health care professional. Talk to your pediatrician regarding the use of this medicine in children. Special care may be needed. Overdosage: If you think you have taken too much of this medicine contact a poison control center or emergency room at once. NOTE: This medicine is only for you. Do not share this medicine with others. What if I miss a dose? It is important not to miss your dose. Call your doctor or health care professional if you are unable to keep an appointment. What may interact with this medicine? Do not take this medicine with any of the following medications:  bevacizumab This list may not describe all possible interactions. Give your health care provider a list of all the medicines, herbs, non-prescription drugs, or dietary supplements you use. Also tell them if you smoke, drink alcohol, or use illegal drugs. Some items may interact with your medicine. What should I watch for while using this medicine? Visit your doctor for checks on your progress. This drug may make you feel generally unwell. This is not uncommon, as chemotherapy can affect healthy cells as well as cancer cells. Report any side effects. Continue your course of treatment even though you feel ill unless your doctor tells you to stop. This medicine can make you more sensitive to the sun. Keep out of the sun while receiving this medicine and for 2 months after the last dose. If you cannot avoid being in the sun, wear protective clothing and use sunscreen. Do not use sun lamps or tanning beds/booths. In some cases, you may be given  additional medicines to help with side effects. Follow all directions for their use. Call your doctor or health care professional for advice if you get a fever, chills or sore throat, or other symptoms of a cold or flu. Do not treat yourself. This drug decreases your body's ability to fight infections. Try to avoid  being around people who are sick. Avoid taking products that contain aspirin, acetaminophen, ibuprofen, naproxen, or ketoprofen unless instructed by your doctor. These medicines may hide a fever. Do not become pregnant while taking this medicine and for 2 months after the last dose. Women should inform their doctor if they wish to become pregnant or think they might be pregnant. There is a potential for serious side effects to an unborn child. Talk to your health care professional or pharmacist for more information. Do not breast-feed an infant while taking this medicine or for 2 months after the last dose. What side effects may I notice from receiving this medicine? Side effects that you should report to your doctor or health care professional as soon as possible:  allergic reactions like skin rash, itching or hives, swelling of the face, lips, or tongue  breathing problems  changes in vision  eye pain  fast, irregular heartbeat  fever, chills  mouth sores  red spots on the skin  redness, blistering, peeling or loosening of the skin, including inside the mouth  signs and symptoms of kidney injury like trouble passing urine or change in the amount of urine  signs and symptoms of low blood pressure like dizziness; feeling faint or lightheaded, falls; unusually weak or tired  signs of low calcium like fast heartbeat, muscle cramps or muscle pain; pain, tingling, numbness in the hands or feet; seizures  signs and symptoms of low magnesium like muscle cramps, pain, or weakness; tremors; seizures; or fast, irregular heartbeat  signs and symptoms of low potassium like muscle cramps or muscle pain; chest pain; dizziness; feeling faint or lightheaded, falls; palpitations; breathing problems; or fast, irregular heartbeat  swelling of the ankles, feet, hands Side effects that usually do not require medical attention (report to your doctor or health care professional if they continue or are  bothersome):  changes in skin like acne, cracks, skin dryness  diarrhea  eyelash growth  headache  mouth sores  nail changes  nausea, vomiting This list may not describe all possible side effects. Call your doctor for medical advice about side effects. You may report side effects to FDA at 1-800-FDA-1088. Where should I keep my medicine? This drug is given in a hospital or clinic and will not be stored at home. NOTE: This sheet is a summary. It may not cover all possible information. If you have questions about this medicine, talk to your doctor, pharmacist, or health care provider.  2020 Elsevier/Gold Standard (2015-12-17 16:45:04)

## 2019-12-03 NOTE — Progress Notes (Signed)
Spoke w/ Lisa/Dr. Benay Spice - patient's adjunctive antibody being changed from bevacizumab to panitumumab today. PA approved per Seth Bake. Pharmacy has drug on hand. Magnesium levels okay to start w/ C2 per Lattie Haw.   Demetrius Charity, PharmD, BCPS, [facility] Pharmacist Pharmacy Phone: [phone] 12/03/2019

## 2019-12-03 NOTE — Patient Instructions (Signed)
Panitumumab Solution for Injection What is this medicine? PANITUMUMAB (pan i TOOM ue mab) is a monoclonal antibody. It is used to treat colorectal cancer. This medicine may be used for other purposes; ask your health care provider or pharmacist if you have questions. COMMON BRAND NAME(S): Vectibix What should I tell my health care provider before I take this medicine? They need to know if you have any of these conditions:  eye disease, vision problems  low levels of calcium, magnesium, or potassium in the blood  lung or breathing disease, like asthma  skin conditions or sensitivity  an unusual or allergic reaction to panitumumab, other medicines, foods, dyes, or preservatives  pregnant or trying to get pregnant  breast-feeding How should I use this medicine? This drug is given as an infusion into a vein. It is administered in a hospital or clinic by a specially trained health care professional. Talk to your pediatrician regarding the use of this medicine in children. Special care may be needed. Overdosage: If you think you have taken too much of this medicine contact a poison control center or emergency room at once. NOTE: This medicine is only for you. Do not share this medicine with others. What if I miss a dose? It is important not to miss your dose. Call your doctor or health care professional if you are unable to keep an appointment. What may interact with this medicine? Do not take this medicine with any of the following medications:  bevacizumab This list may not describe all possible interactions. Give your health care provider a list of all the medicines, herbs, non-prescription drugs, or dietary supplements you use. Also tell them if you smoke, drink alcohol, or use illegal drugs. Some items may interact with your medicine. What should I watch for while using this medicine? Visit your doctor for checks on your progress. This drug may make you feel generally unwell. This is  not uncommon, as chemotherapy can affect healthy cells as well as cancer cells. Report any side effects. Continue your course of treatment even though you feel ill unless your doctor tells you to stop. This medicine can make you more sensitive to the sun. Keep out of the sun while receiving this medicine and for 2 months after the last dose. If you cannot avoid being in the sun, wear protective clothing and use sunscreen. Do not use sun lamps or tanning beds/booths. In some cases, you may be given additional medicines to help with side effects. Follow all directions for their use. Call your doctor or health care professional for advice if you get a fever, chills or sore throat, or other symptoms of a cold or flu. Do not treat yourself. This drug decreases your body's ability to fight infections. Try to avoid being around people who are sick. Avoid taking products that contain aspirin, acetaminophen, ibuprofen, naproxen, or ketoprofen unless instructed by your doctor. These medicines may hide a fever. Do not become pregnant while taking this medicine and for 2 months after the last dose. Women should inform their doctor if they wish to become pregnant or think they might be pregnant. There is a potential for serious side effects to an unborn child. Talk to your health care professional or pharmacist for more information. Do not breast-feed an infant while taking this medicine or for 2 months after the last dose. What side effects may I notice from receiving this medicine? Side effects that you should report to your doctor or health care professional as soon  as possible:  allergic reactions like skin rash, itching or hives, swelling of the face, lips, or tongue  breathing problems  changes in vision  eye pain  fast, irregular heartbeat  fever, chills  mouth sores  red spots on the skin  redness, blistering, peeling or loosening of the skin, including inside the mouth  signs and symptoms of  kidney injury like trouble passing urine or change in the amount of urine  signs and symptoms of low blood pressure like dizziness; feeling faint or lightheaded, falls; unusually weak or tired  signs of low calcium like fast heartbeat, muscle cramps or muscle pain; pain, tingling, numbness in the hands or feet; seizures  signs and symptoms of low magnesium like muscle cramps, pain, or weakness; tremors; seizures; or fast, irregular heartbeat  signs and symptoms of low potassium like muscle cramps or muscle pain; chest pain; dizziness; feeling faint or lightheaded, falls; palpitations; breathing problems; or fast, irregular heartbeat  swelling of the ankles, feet, hands Side effects that usually do not require medical attention (report to your doctor or health care professional if they continue or are bothersome):  changes in skin like acne, cracks, skin dryness  diarrhea  eyelash growth  headache  mouth sores  nail changes  nausea, vomiting This list may not describe all possible side effects. Call your doctor for medical advice about side effects. You may report side effects to FDA at 1-800-FDA-1088. Where should I keep my medicine? This drug is given in a hospital or clinic and will not be stored at home. NOTE: This sheet is a summary. It may not cover all possible information. If you have questions about this medicine, talk to your doctor, pharmacist, or health care provider.  2020 Elsevier/Gold Standard (2015-12-17 16:45:04)

## 2019-12-04 ENCOUNTER — Telehealth: Payer: Self-pay | Admitting: *Deleted

## 2019-12-04 ENCOUNTER — Telehealth: Payer: Self-pay | Admitting: Nurse Practitioner

## 2019-12-04 NOTE — Telephone Encounter (Signed)
Patient called to ask if his chemo is every 2 or 3 weeks? Also asking for his next appointment times. Informed nurse that the tubing popped out of pump last night, but he was able to put it back together and it is running now. Informed him it is every 2 weeks and to go by scheduling when he comes in for pump d/c tomorrow to get his appointments.

## 2019-12-04 NOTE — Telephone Encounter (Signed)
No los on 6/23.

## 2019-12-05 ENCOUNTER — Inpatient Hospital Stay: Payer: Medicare PPO

## 2019-12-05 ENCOUNTER — Other Ambulatory Visit: Payer: Self-pay

## 2019-12-05 VITALS — BP 131/58 | HR 61 | Temp 98.1°F | Resp 18

## 2019-12-05 DIAGNOSIS — C189 Malignant neoplasm of colon, unspecified: Secondary | ICD-10-CM | POA: Diagnosis not present

## 2019-12-05 DIAGNOSIS — C787 Secondary malignant neoplasm of liver and intrahepatic bile duct: Secondary | ICD-10-CM | POA: Diagnosis not present

## 2019-12-05 DIAGNOSIS — Z79899 Other long term (current) drug therapy: Secondary | ICD-10-CM | POA: Diagnosis not present

## 2019-12-05 DIAGNOSIS — Z5111 Encounter for antineoplastic chemotherapy: Secondary | ICD-10-CM | POA: Diagnosis not present

## 2019-12-05 DIAGNOSIS — J029 Acute pharyngitis, unspecified: Secondary | ICD-10-CM | POA: Diagnosis not present

## 2019-12-05 DIAGNOSIS — C180 Malignant neoplasm of cecum: Secondary | ICD-10-CM

## 2019-12-05 DIAGNOSIS — Z5112 Encounter for antineoplastic immunotherapy: Secondary | ICD-10-CM | POA: Diagnosis not present

## 2019-12-05 DIAGNOSIS — R062 Wheezing: Secondary | ICD-10-CM | POA: Diagnosis not present

## 2019-12-05 MED ORDER — SODIUM CHLORIDE 0.9% FLUSH
10.0000 mL | INTRAVENOUS | Status: DC | PRN
  Administered 2019-12-05: 10 mL
  Filled 2019-12-05: qty 10

## 2019-12-05 MED ORDER — HEPARIN SOD (PORK) LOCK FLUSH 100 UNIT/ML IV SOLN
500.0000 [IU] | Freq: Once | INTRAVENOUS | Status: AC | PRN
  Administered 2019-12-05: 500 [IU]
  Filled 2019-12-05: qty 5

## 2019-12-08 ENCOUNTER — Telehealth: Payer: Self-pay | Admitting: Nurse Practitioner

## 2019-12-08 NOTE — Telephone Encounter (Signed)
Scheduled per 6/25 sch message. Pt is aware of appt times and dates.

## 2019-12-10 DIAGNOSIS — C180 Malignant neoplasm of cecum: Secondary | ICD-10-CM | POA: Diagnosis not present

## 2019-12-15 ENCOUNTER — Other Ambulatory Visit: Payer: Self-pay | Admitting: Oncology

## 2019-12-17 ENCOUNTER — Inpatient Hospital Stay: Payer: Medicare PPO

## 2019-12-17 ENCOUNTER — Encounter: Payer: Self-pay | Admitting: Nurse Practitioner

## 2019-12-17 ENCOUNTER — Other Ambulatory Visit: Payer: Self-pay

## 2019-12-17 ENCOUNTER — Inpatient Hospital Stay: Payer: Medicare PPO | Attending: Nurse Practitioner | Admitting: Nurse Practitioner

## 2019-12-17 VITALS — BP 118/62 | HR 62 | Temp 97.7°F | Resp 18 | Ht 71.0 in | Wt 230.4 lb

## 2019-12-17 DIAGNOSIS — C180 Malignant neoplasm of cecum: Secondary | ICD-10-CM

## 2019-12-17 DIAGNOSIS — Z5111 Encounter for antineoplastic chemotherapy: Secondary | ICD-10-CM | POA: Diagnosis not present

## 2019-12-17 DIAGNOSIS — C787 Secondary malignant neoplasm of liver and intrahepatic bile duct: Secondary | ICD-10-CM | POA: Diagnosis not present

## 2019-12-17 DIAGNOSIS — Z5112 Encounter for antineoplastic immunotherapy: Secondary | ICD-10-CM | POA: Diagnosis not present

## 2019-12-17 DIAGNOSIS — Z79899 Other long term (current) drug therapy: Secondary | ICD-10-CM | POA: Diagnosis not present

## 2019-12-17 LAB — CMP (CANCER CENTER ONLY)
ALT: 44 U/L (ref 0–44)
AST: 40 U/L (ref 15–41)
Albumin: 3.4 g/dL — ABNORMAL LOW (ref 3.5–5.0)
Alkaline Phosphatase: 86 U/L (ref 38–126)
Anion gap: 7 (ref 5–15)
BUN: 16 mg/dL (ref 8–23)
CO2: 26 mmol/L (ref 22–32)
Calcium: 8.7 mg/dL — ABNORMAL LOW (ref 8.9–10.3)
Chloride: 102 mmol/L (ref 98–111)
Creatinine: 0.86 mg/dL (ref 0.61–1.24)
GFR, Est AFR Am: >60 mL/min (ref >60)
GFR, Estimated: >60 mL/min (ref >60)
Glucose, Bld: 100 mg/dL — ABNORMAL HIGH (ref 70–99)
Potassium: 4.6 mmol/L (ref 3.5–5.1)
Sodium: 135 mmol/L (ref 135–145)
Total Bilirubin: 0.8 mg/dL (ref 0.3–1.2)
Total Protein: 6.2 g/dL — ABNORMAL LOW (ref 6.5–8.1)

## 2019-12-17 LAB — CBC WITH DIFFERENTIAL (CANCER CENTER ONLY)
Abs Immature Granulocytes: 0.02 10*3/uL (ref 0.00–0.07)
Basophils Absolute: 0 10*3/uL (ref 0.0–0.1)
Basophils Relative: 1 %
Eosinophils Absolute: 0.1 10*3/uL (ref 0.0–0.5)
Eosinophils Relative: 3 %
HCT: 34.9 % — ABNORMAL LOW (ref 39.0–52.0)
Hemoglobin: 11.8 g/dL — ABNORMAL LOW (ref 13.0–17.0)
Immature Granulocytes: 0 %
Lymphocytes Relative: 8 %
Lymphs Abs: 0.4 10*3/uL — ABNORMAL LOW (ref 0.7–4.0)
MCH: 32.1 pg (ref 26.0–34.0)
MCHC: 33.8 g/dL (ref 30.0–36.0)
MCV: 94.8 fL (ref 80.0–100.0)
Monocytes Absolute: 0.7 10*3/uL (ref 0.1–1.0)
Monocytes Relative: 15 %
Neutro Abs: 3.4 10*3/uL (ref 1.7–7.7)
Neutrophils Relative %: 73 %
Platelet Count: 93 10*3/uL — ABNORMAL LOW (ref 150–400)
RBC: 3.68 MIL/uL — ABNORMAL LOW (ref 4.22–5.81)
RDW: 16.2 % — ABNORMAL HIGH (ref 11.5–15.5)
WBC Count: 4.6 10*3/uL (ref 4.0–10.5)
nRBC: 0 % (ref 0.0–0.2)

## 2019-12-17 LAB — MAGNESIUM: Magnesium: 1.9 mg/dL (ref 1.7–2.4)

## 2019-12-17 MED ORDER — FLUTICASONE PROPIONATE 0.05 % EX CREA: TOPICAL_CREAM | Freq: Two times a day (BID) | CUTANEOUS | 2 refills | Status: DC

## 2019-12-17 MED ORDER — SODIUM CHLORIDE 0.9 % IV SOLN
Freq: Once | INTRAVENOUS | Status: AC
  Filled 2019-12-17: qty 250

## 2019-12-17 MED ORDER — SODIUM CHLORIDE 0.9 % IV SOLN: 6.0000 mg/kg | Freq: Once | INTRAVENOUS | Status: DC

## 2019-12-17 MED ORDER — SODIUM CHLORIDE 0.9 % IV SOLN
10.0000 mg | Freq: Once | INTRAVENOUS | Status: AC
  Administered 2019-12-17: 10 mg via INTRAVENOUS
  Filled 2019-12-17: qty 10

## 2019-12-17 MED ORDER — SODIUM CHLORIDE 0.9 % IV SOLN
2000.0000 mg/m2 | INTRAVENOUS | Status: DC
  Administered 2019-12-17: 4650 mg via INTRAVENOUS
  Filled 2019-12-17: qty 93

## 2019-12-17 MED ORDER — SODIUM CHLORIDE 0.9 % IV SOLN
3.0000 mg/kg | Freq: Once | INTRAVENOUS | Status: AC
  Administered 2019-12-17: 300 mg via INTRAVENOUS
  Filled 2019-12-17: qty 15

## 2019-12-17 MED ORDER — SODIUM CHLORIDE 0.9 % IV SOLN
400.0000 mg/m2 | Freq: Once | INTRAVENOUS | Status: AC
  Administered 2019-12-17: 928 mg via INTRAVENOUS
  Filled 2019-12-17: qty 46.4

## 2019-12-17 MED ORDER — ATROPINE SULFATE 1 MG/ML IJ SOLN
0.5000 mg | Freq: Once | INTRAMUSCULAR | Status: AC | PRN
  Administered 2019-12-17: 0.5 mg via INTRAVENOUS

## 2019-12-17 MED ORDER — PALONOSETRON HCL INJECTION 0.25 MG/5ML
INTRAVENOUS | Status: AC
  Filled 2019-12-17: qty 5

## 2019-12-17 MED ORDER — ATROPINE SULFATE 1 MG/ML IJ SOLN
INTRAMUSCULAR | Status: AC
  Filled 2019-12-17: qty 1

## 2019-12-17 MED ORDER — SODIUM CHLORIDE 0.9 % IV SOLN
Freq: Once | INTRAVENOUS | Status: DC
  Filled 2019-12-17: qty 250

## 2019-12-17 MED ORDER — SODIUM CHLORIDE 0.9 % IV SOLN
176.0000 mg/m2 | Freq: Once | INTRAVENOUS | Status: AC
  Administered 2019-12-17: 400 mg via INTRAVENOUS
  Filled 2019-12-17: qty 15

## 2019-12-17 MED ORDER — PALONOSETRON HCL INJECTION 0.25 MG/5ML
0.2500 mg | Freq: Once | INTRAVENOUS | Status: AC
  Administered 2019-12-17: 0.25 mg via INTRAVENOUS

## 2019-12-17 NOTE — Progress Notes (Signed)
Spoke w/ Lattie Haw, okay to treat with platelet count today. Panitumumab dose to be reduced to 3 mg/kg today for severe rash and Mg level is being added on to labs today.   Demetrius Charity, PharmD, BCPS, [facility] Pharmacist Pharmacy Phone: [phone] 12/17/2019

## 2019-12-17 NOTE — Progress Notes (Addendum)
[place] OFFICE PROGRESS NOTE   Diagnosis: Colon cancer  INTERVAL HISTORY:   Luis [name] returns as scheduled.  He completed cycle 1 FOLFIRI/Panitumumab 12/03/2019.  He had a single episode of nausea on day 2.  No mouth sores.  He developed diarrhea after taking laxatives for 2 days.  He went to the bathroom frequently, stools semiloose, for several days.  He took Imodium with good results.  He has developed a rash over his trunk/neck/face.  The rash is pruritic.  He recently noted a mild rash on his right upper arm.  He has been wheezing recently.  He was prescribed a 5-day course of steroids last week.  Objective:  Vital signs in last 24 hours:  Blood pressure 118/62, pulse 62, temperature 97.7 F (36.5 C), temperature source Temporal, resp. rate 18, height '5\' 11"'$  (1.803 m), weight 230 lb 6.4 oz (104.5 kg), SpO2 97 %.    HEENT: No thrush or ulcers. Resp: Lungs clear bilaterally. Cardio: Regular rate and rhythm. GI: Abdomen soft and nontender.  No hepatomegaly. Vascular: No leg edema. Skin: Significant acne type rash over the anterior chest, back, neck, scalp, face.  Similar lesions scattered over the extremities as well. Port-A-Cath without erythema.   Lab Results:  Lab Results  Component Value Date   WBC 4.6 12/17/2019   HGB 11.8 (L) 12/17/2019   HCT 34.9 (L) 12/17/2019   MCV 94.8 12/17/2019   PLT 93 (L) 12/17/2019   NEUTROABS 3.4 12/17/2019    Imaging:  No results found.  Medications: I have reviewed the patient's current medications.  Assessment/Plan: 1. Adenocarcinoma of the cecum, stage IIA(T3N0), status post a right colectomy 07/26/2017 ? 0/16 lymph nodes positive, no lymphovascular invasion, perineural invasion present ? MSI-stable, no loss of mismatch repair protein expression ? Surveillance colonoscopy 07/26/2018-no polyps found ? Elevated CEA June 2020, persistently elevated August 2020 ? CTs 02/07/2019-bilateral liver metastases, no  extrahepatic metastatic disease, changes of sarcoidosis in the chest ? Biopsy liver lesion 03/04/2019-metastatic adenocarcinoma, with morphology consistent with metastatic colonic adenocarcinoma, RAS WT ? Cycle 1 FOLFOX 03/12/2019 ? Cycle 2 FOLFOX 03/26/2019, oxaliplatin dose reduced secondary to thrombocytopenia ? Cycle 3 FOLFOX 04/09/2019, oxaliplatin further dose reduced and aspirin held secondary to thrombocytopenia ? Cycle 4 FOLFOX 04/23/2019 ? Cycle 5 FOLFOX 05/07/2019 ? CTs 05/23/2019-decrease in liver lesions. No new or progressive findings. ? Cycle 6 FOLFOX 05/27/2019 ? Cycle 7 FOLFOX 06/14/2019 ? Cycle 8 FOLFOX 07/02/2019-Udenyca held ? Cycle 9 FOLFOX 07/16/2019-Udenyca held ? Cycle 10 FOLFOX 07/30/2019-Udenyca held ? CT abdomen/pelvis 08/13/2019-mild improvement in previously noted liver lesions ? MRI abdomen 08/13/2019-3 liver lesions ? Cycle 1 FOLFIRI/Avastin 09/17/2019 ? Cycle 2 FOLFIRI/Avastin 10/01/2019 ? Cycle 3 FOLFIRI/Avastin 10/15/2019 ? Cycle 4 FOLFIRI/Avastin 11/05/2019 ? Cycle 5 FOLFIRI/Avastin 11/19/2019 ? CTs 11/27/2019-stable liver lesions; no new liver lesions.  No other evidence of metastatic disease in the abdomen or pelvis. ? Cycle 1 FOLFIRI/Panitumumab 12/03/2019 ? Cycle 2 FOLFIRI/Panitumumab 12/17/2019 (Panitumumab dose reduced due to rash) 2. History of colon polyps 3. Microcytic anemia January 2019 4. Sarcoidosis 5. Coronary artery disease 6. Port-A-Cath placement, interventional radiology, 03/04/2019 7. Thrombocytopenia, mild thrombocytopenia predating chemotherapy 8. Oxaliplatin neuropathy  Disposition: Luis [name] has completed 1 cycle of FOLFIRI/Panitumumab.  He has developed a significant rash related to Panitumumab.  We discussed options to include holding Panitumumab today versus a dose reduction.  He would like to proceed with treatment and dose reduce the Panitumumab.  Plan to proceed with cycle 2 today as scheduled.  Panitumumab  will be dose reduced by 50%.  He will  continue doxycycline.  Prescription sent to his pharmacy for fluticasone to apply to the bothersome areas on his face twice a day.  We reviewed the CBC and chemistry panel from today.  Labs adequate to proceed with treatment.  He has mild progressive thrombocytopenia.  He understands to contact the office with bleeding.  We are adding a magnesium level.  He will return for lab, follow-up, cycle 3 FOLFIRI/Panitumumab in 2 weeks.  Patient seen with Dr. Benay Spice.  Ned Card ANP/GNP-BC   12/17/2019  10:51 AM This was a shared visit with Ned Card.  Luis [name] was interviewed and examined.  He has developed a Panitumumab skin rash.  He appears to be tolerating the rash well.  We discussed holding Panitumumab with a cycle versus proceeding with a dose reduction.  He prefers proceeding with FOLFIRI/Panitumumab today.  He will be prescribed fluticasone ointment to use as needed on his face.  Luis [name] will return for an office visit in the next cycle of FOLFIRI/Panitumumab in 2 weeks.  Julieanne Manson, MD

## 2019-12-17 NOTE — Patient Instructions (Signed)
[facility] Discharge Instructions for Patients Receiving Chemotherapy  Today you received the following chemotherapy agents: Vectibix, Irinotecan, Leucovorin, 5FU   To help prevent nausea and vomiting after your treatment, we encourage you to take your nausea medication as directed.    If you develop nausea and vomiting that is not controlled by your nausea medication, call the clinic.   BELOW ARE SYMPTOMS THAT SHOULD BE REPORTED IMMEDIATELY:  *FEVER GREATER THAN 100.5 F  *CHILLS WITH OR WITHOUT FEVER  NAUSEA AND VOMITING THAT IS NOT CONTROLLED WITH YOUR NAUSEA MEDICATION  *UNUSUAL SHORTNESS OF BREATH  *UNUSUAL BRUISING OR BLEEDING  TENDERNESS IN MOUTH AND THROAT WITH OR WITHOUT PRESENCE OF ULCERS  *URINARY PROBLEMS  *BOWEL PROBLEMS  UNUSUAL RASH Items with * indicate a potential emergency and should be followed up as soon as possible.  Feel free to call the clinic should you have any questions or concerns. The clinic phone number is [phone].  Please show the CHEMO ALERT CARD at check-in to the Emergency Department and triage nurse.   

## 2019-12-18 ENCOUNTER — Telehealth: Payer: Self-pay | Admitting: Nurse Practitioner

## 2019-12-18 NOTE — Telephone Encounter (Signed)
Per 7/7 los, no changes made to pt schedule  

## 2019-12-19 ENCOUNTER — Other Ambulatory Visit: Payer: Self-pay

## 2019-12-19 ENCOUNTER — Inpatient Hospital Stay: Payer: Medicare PPO

## 2019-12-19 VITALS — BP 122/69 | HR 62 | Temp 98.1°F | Resp 18

## 2019-12-19 DIAGNOSIS — C180 Malignant neoplasm of cecum: Secondary | ICD-10-CM | POA: Diagnosis not present

## 2019-12-19 DIAGNOSIS — Z5111 Encounter for antineoplastic chemotherapy: Secondary | ICD-10-CM | POA: Diagnosis not present

## 2019-12-19 DIAGNOSIS — Z79899 Other long term (current) drug therapy: Secondary | ICD-10-CM | POA: Diagnosis not present

## 2019-12-19 DIAGNOSIS — C787 Secondary malignant neoplasm of liver and intrahepatic bile duct: Secondary | ICD-10-CM | POA: Diagnosis not present

## 2019-12-19 DIAGNOSIS — Z5112 Encounter for antineoplastic immunotherapy: Secondary | ICD-10-CM | POA: Diagnosis not present

## 2019-12-19 MED ORDER — SODIUM CHLORIDE 0.9% FLUSH
10.0000 mL | INTRAVENOUS | Status: DC | PRN
  Administered 2019-12-19: 10 mL
  Filled 2019-12-19: qty 10

## 2019-12-19 MED ORDER — HEPARIN SOD (PORK) LOCK FLUSH 100 UNIT/ML IV SOLN
500.0000 [IU] | Freq: Once | INTRAVENOUS | Status: AC | PRN
  Administered 2019-12-19: 500 [IU]
  Filled 2019-12-19: qty 5

## 2019-12-19 NOTE — Patient Instructions (Signed)

## 2019-12-28 ENCOUNTER — Other Ambulatory Visit: Payer: Self-pay | Admitting: Oncology

## 2019-12-29 ENCOUNTER — Other Ambulatory Visit: Payer: Self-pay | Admitting: Physician Assistant

## 2019-12-31 ENCOUNTER — Inpatient Hospital Stay: Payer: Medicare PPO

## 2019-12-31 ENCOUNTER — Inpatient Hospital Stay: Payer: Medicare PPO | Admitting: Nurse Practitioner

## 2020-01-01 ENCOUNTER — Other Ambulatory Visit: Payer: Self-pay

## 2020-01-01 ENCOUNTER — Inpatient Hospital Stay: Payer: Medicare PPO | Admitting: Oncology

## 2020-01-01 ENCOUNTER — Telehealth: Payer: Self-pay | Admitting: Oncology

## 2020-01-01 ENCOUNTER — Inpatient Hospital Stay: Payer: Medicare PPO

## 2020-01-01 VITALS — BP 133/67 | HR 60 | Temp 97.7°F | Resp 17 | Ht 71.0 in | Wt 230.1 lb

## 2020-01-01 DIAGNOSIS — Z5111 Encounter for antineoplastic chemotherapy: Secondary | ICD-10-CM | POA: Diagnosis not present

## 2020-01-01 DIAGNOSIS — Z5112 Encounter for antineoplastic immunotherapy: Secondary | ICD-10-CM | POA: Diagnosis not present

## 2020-01-01 DIAGNOSIS — C787 Secondary malignant neoplasm of liver and intrahepatic bile duct: Secondary | ICD-10-CM | POA: Diagnosis not present

## 2020-01-01 DIAGNOSIS — Z95828 Presence of other vascular implants and grafts: Secondary | ICD-10-CM

## 2020-01-01 DIAGNOSIS — Z79899 Other long term (current) drug therapy: Secondary | ICD-10-CM | POA: Diagnosis not present

## 2020-01-01 DIAGNOSIS — C180 Malignant neoplasm of cecum: Secondary | ICD-10-CM | POA: Diagnosis not present

## 2020-01-01 LAB — CBC WITH DIFFERENTIAL (CANCER CENTER ONLY)
Abs Immature Granulocytes: 0.02 10*3/uL (ref 0.00–0.07)
Basophils Absolute: 0 10*3/uL (ref 0.0–0.1)
Basophils Relative: 1 %
Eosinophils Absolute: 0.2 10*3/uL (ref 0.0–0.5)
Eosinophils Relative: 5 %
HCT: 35.1 % — ABNORMAL LOW (ref 39.0–52.0)
Hemoglobin: 11.7 g/dL — ABNORMAL LOW (ref 13.0–17.0)
Immature Granulocytes: 1 %
Lymphocytes Relative: 15 %
Lymphs Abs: 0.6 10*3/uL — ABNORMAL LOW (ref 0.7–4.0)
MCH: 31.4 pg (ref 26.0–34.0)
MCHC: 33.3 g/dL (ref 30.0–36.0)
MCV: 94.1 fL (ref 80.0–100.0)
Monocytes Absolute: 0.6 10*3/uL (ref 0.1–1.0)
Monocytes Relative: 17 %
Neutro Abs: 2.4 10*3/uL (ref 1.7–7.7)
Neutrophils Relative %: 61 %
Platelet Count: 172 10*3/uL (ref 150–400)
RBC: 3.73 MIL/uL — ABNORMAL LOW (ref 4.22–5.81)
RDW: 16.8 % — ABNORMAL HIGH (ref 11.5–15.5)
WBC Count: 3.8 10*3/uL — ABNORMAL LOW (ref 4.0–10.5)
nRBC: 0 % (ref 0.0–0.2)

## 2020-01-01 LAB — CMP (CANCER CENTER ONLY)
ALT: 28 U/L (ref 0–44)
AST: 33 U/L (ref 15–41)
Albumin: 3.5 g/dL (ref 3.5–5.0)
Alkaline Phosphatase: 85 U/L (ref 38–126)
Anion gap: 8 (ref 5–15)
BUN: 18 mg/dL (ref 8–23)
CO2: 25 mmol/L (ref 22–32)
Calcium: 9.5 mg/dL (ref 8.9–10.3)
Chloride: 106 mmol/L (ref 98–111)
Creatinine: 0.84 mg/dL (ref 0.61–1.24)
GFR, Est AFR Am: >60 mL/min (ref >60)
GFR, Estimated: >60 mL/min (ref >60)
Glucose, Bld: 97 mg/dL (ref 70–99)
Potassium: 4.5 mmol/L (ref 3.5–5.1)
Sodium: 139 mmol/L (ref 135–145)
Total Bilirubin: 0.6 mg/dL (ref 0.3–1.2)
Total Protein: 6.6 g/dL (ref 6.5–8.1)

## 2020-01-01 LAB — MAGNESIUM: Magnesium: 2 mg/dL (ref 1.7–2.4)

## 2020-01-01 MED ORDER — ATROPINE SULFATE 1 MG/ML IJ SOLN
0.5000 mg | Freq: Once | INTRAMUSCULAR | Status: AC | PRN
  Administered 2020-01-01: 0.5 mg via INTRAVENOUS

## 2020-01-01 MED ORDER — ATROPINE SULFATE 1 MG/ML IJ SOLN
INTRAMUSCULAR | Status: AC
  Filled 2020-01-01: qty 1

## 2020-01-01 MED ORDER — SODIUM CHLORIDE 0.9% FLUSH
10.0000 mL | INTRAVENOUS | Status: DC | PRN
  Administered 2020-01-01: 10 mL
  Filled 2020-01-01: qty 10

## 2020-01-01 MED ORDER — PALONOSETRON HCL INJECTION 0.25 MG/5ML
0.2500 mg | Freq: Once | INTRAVENOUS | Status: AC
  Administered 2020-01-01: 0.25 mg via INTRAVENOUS

## 2020-01-01 MED ORDER — PALONOSETRON HCL INJECTION 0.25 MG/5ML
INTRAVENOUS | Status: AC
  Filled 2020-01-01: qty 5

## 2020-01-01 MED ORDER — SODIUM CHLORIDE 0.9 % IV SOLN
3.0000 mg/kg | Freq: Once | INTRAVENOUS | Status: AC
  Administered 2020-01-01: 300 mg via INTRAVENOUS
  Filled 2020-01-01: qty 15

## 2020-01-01 MED ORDER — SODIUM CHLORIDE 0.9 % IV SOLN
176.0000 mg/m2 | Freq: Once | INTRAVENOUS | Status: AC
  Administered 2020-01-01: 400 mg via INTRAVENOUS
  Filled 2020-01-01: qty 5

## 2020-01-01 MED ORDER — SODIUM CHLORIDE 0.9 % IV SOLN
2000.0000 mg/m2 | INTRAVENOUS | Status: DC
  Administered 2020-01-01: 4650 mg via INTRAVENOUS
  Filled 2020-01-01: qty 93

## 2020-01-01 MED ORDER — SODIUM CHLORIDE 0.9 % IV SOLN
10.0000 mg | Freq: Once | INTRAVENOUS | Status: AC
  Administered 2020-01-01: 10 mg via INTRAVENOUS
  Filled 2020-01-01: qty 1
  Filled 2020-01-01: qty 10

## 2020-01-01 MED ORDER — SODIUM CHLORIDE 0.9 % IV SOLN
400.0000 mg/m2 | Freq: Once | INTRAVENOUS | Status: AC
  Administered 2020-01-01: 928 mg via INTRAVENOUS
  Filled 2020-01-01: qty 46.4

## 2020-01-01 MED ORDER — SODIUM CHLORIDE 0.9 % IV SOLN
Freq: Once | INTRAVENOUS | Status: AC
  Filled 2020-01-01: qty 250

## 2020-01-01 NOTE — Patient Instructions (Signed)
[facility] Discharge Instructions for Patients Receiving Chemotherapy  Today you received the following chemotherapy agents: Irinotecan, Leucovorin, Fluorouracil, and Vectibix  To help prevent nausea and vomiting after your treatment, we encourage you to take your nausea medication as prescribed.    If you develop nausea and vomiting that is not controlled by your nausea medication, call the clinic.   BELOW ARE SYMPTOMS THAT SHOULD BE REPORTED IMMEDIATELY:  *FEVER GREATER THAN 100.5 F  *CHILLS WITH OR WITHOUT FEVER  NAUSEA AND VOMITING THAT IS NOT CONTROLLED WITH YOUR NAUSEA MEDICATION  *UNUSUAL SHORTNESS OF BREATH  *UNUSUAL BRUISING OR BLEEDING  TENDERNESS IN MOUTH AND THROAT WITH OR WITHOUT PRESENCE OF ULCERS  *URINARY PROBLEMS  *BOWEL PROBLEMS  UNUSUAL RASH Items with * indicate a potential emergency and should be followed up as soon as possible.  Feel free to call the clinic should you have any questions or concerns. The clinic phone number is [phone].  Please show the CHEMO ALERT CARD at check-in to the Emergency Department and triage nurse.   

## 2020-01-01 NOTE — Telephone Encounter (Signed)
Scheduled per 07/22 los, patient will be notified per My chart.

## 2020-01-01 NOTE — Progress Notes (Signed)
[place] OFFICE PROGRESS NOTE   Diagnosis: Colon cancer  INTERVAL HISTORY:   Luis [name] completed another cycle of FOLFIRI/Panitumumab on 12/17/2019.  No nausea/vomiting or diarrhea.  The rash over the face has improved with fluticasone.  Stable rash over the trunk.  He is exercising.  No chest pain.  Objective:  Vital signs in last 24 hours:  Blood pressure (!) 133/67, pulse 60, temperature 97.7 F (36.5 C), temperature source Temporal, resp. rate 17, height '5\' 11"'$  (1.803 m), weight (!) 230 lb 1.6 oz (104.4 kg), SpO2 100 %.    HEENT: No thrush or ulcers Resp: Lungs clear bilaterally Cardio: Regular rate and rhythm GI: No hepatomegaly, nontender Vascular: Trace lower pretibial edema bilaterally  Skin: Acne type rash over the face and trunk, the rash over the face has regressed significantly, the trunk rash is less bright  Portacath/PICC-without erythema  Lab Results:  Lab Results  Component Value Date   WBC 3.8 (L) 01/01/2020   HGB 11.7 (L) 01/01/2020   HCT 35.1 (L) 01/01/2020   MCV 94.1 01/01/2020   PLT 172 01/01/2020   NEUTROABS 2.4 01/01/2020    CMP  Lab Results  Component Value Date   NA 139 01/01/2020   K 4.5 01/01/2020   CL 106 01/01/2020   CO2 25 01/01/2020   GLUCOSE 97 01/01/2020   BUN 18 01/01/2020   CREATININE 0.84 01/01/2020   CALCIUM 9.5 01/01/2020   PROT 6.6 01/01/2020   ALBUMIN 3.5 01/01/2020   AST 33 01/01/2020   ALT 28 01/01/2020   ALKPHOS 85 01/01/2020   BILITOT 0.6 01/01/2020   GFRNONAA >60 01/01/2020   GFRAA >60 01/01/2020    Lab Results  Component Value Date   CEA1 2.59 12/03/2019     Medications: I have reviewed the patient's current medications.   Assessment/Plan: 1. Adenocarcinoma of the cecum, stage IIA(T3N0), status post a right colectomy 07/26/2017 ? 0/16 lymph nodes positive, no lymphovascular invasion, perineural invasion present ? MSI-stable, no loss of mismatch repair protein  expression ? Surveillance colonoscopy 07/26/2018-no polyps found ? Elevated CEA June 2020, persistently elevated August 2020 ? CTs 02/07/2019-bilateral liver metastases, no extrahepatic metastatic disease, changes of sarcoidosis in the chest ? Biopsy liver lesion 03/04/2019-metastatic adenocarcinoma, with morphology consistent with metastatic colonic adenocarcinoma, RAS WT ? Cycle 1 FOLFOX 03/12/2019 ? Cycle 2 FOLFOX 03/26/2019, oxaliplatin dose reduced secondary to thrombocytopenia ? Cycle 3 FOLFOX 04/09/2019, oxaliplatin further dose reduced and aspirin held secondary to thrombocytopenia ? Cycle 4 FOLFOX 04/23/2019 ? Cycle 5 FOLFOX 05/07/2019 ? CTs 05/23/2019-decrease in liver lesions. No new or progressive findings. ? Cycle 6 FOLFOX 05/27/2019 ? Cycle 7 FOLFOX 06/14/2019 ? Cycle 8 FOLFOX 07/02/2019-Udenyca held ? Cycle 9 FOLFOX 07/16/2019-Udenyca held ? Cycle 10 FOLFOX 07/30/2019-Udenyca held ? CT abdomen/pelvis 08/13/2019-mild improvement in previously noted liver lesions ? MRI abdomen 08/13/2019-3 liver lesions ? Cycle 1 FOLFIRI/Avastin 09/17/2019 ? Cycle 2 FOLFIRI/Avastin 10/01/2019 ? Cycle 3 FOLFIRI/Avastin 10/15/2019 ? Cycle 4 FOLFIRI/Avastin 11/05/2019 ? Cycle 5 FOLFIRI/Avastin 11/19/2019 ? CTs 11/27/2019-stable liver lesions; no new liver lesions.  No other evidence of metastatic disease in the abdomen or pelvis. ? Cycle 1 FOLFIRI/Panitumumab 12/03/2019 ? Cycle 2 FOLFIRI/Panitumumab 12/17/2019 (Panitumumab dose reduced due to rash) ? Cycle 3 FOLFIRI/Panitumumab 01/01/2020 2. History of colon polyps 3. Microcytic anemia January 2019 4. Sarcoidosis 5. Coronary artery disease 6. Port-A-Cath placement, interventional radiology, 03/04/2019 7. Thrombocytopenia, mild thrombocytopenia predating chemotherapy 8. Oxaliplatin neuropathy    Disposition: Luis [name] appears stable.  The skin rash  is partially improved.  The plan is to continue Panitumumab at the current dose.  He will complete another cycle  of FOLFIRI/Panitumumab today.  Luis [name] will return for an office visit and chemotherapy in 2 weeks.  Betsy Coder, MD  01/01/2020  12:33 PM

## 2020-01-01 NOTE — Patient Instructions (Signed)

## 2020-01-02 ENCOUNTER — Inpatient Hospital Stay: Payer: Medicare PPO

## 2020-01-03 ENCOUNTER — Other Ambulatory Visit: Payer: Self-pay

## 2020-01-03 ENCOUNTER — Inpatient Hospital Stay: Payer: Medicare PPO

## 2020-01-03 VITALS — BP 133/59 | HR 68 | Temp 98.1°F | Resp 17 | Ht 71.0 in

## 2020-01-03 DIAGNOSIS — Z5111 Encounter for antineoplastic chemotherapy: Secondary | ICD-10-CM | POA: Diagnosis not present

## 2020-01-03 DIAGNOSIS — Z5112 Encounter for antineoplastic immunotherapy: Secondary | ICD-10-CM | POA: Diagnosis not present

## 2020-01-03 DIAGNOSIS — C787 Secondary malignant neoplasm of liver and intrahepatic bile duct: Secondary | ICD-10-CM | POA: Diagnosis not present

## 2020-01-03 DIAGNOSIS — Z79899 Other long term (current) drug therapy: Secondary | ICD-10-CM | POA: Diagnosis not present

## 2020-01-03 DIAGNOSIS — C180 Malignant neoplasm of cecum: Secondary | ICD-10-CM | POA: Diagnosis not present

## 2020-01-03 MED ORDER — SODIUM CHLORIDE 0.9% FLUSH
10.0000 mL | INTRAVENOUS | Status: DC | PRN
  Administered 2020-01-03: 10 mL
  Filled 2020-01-03: qty 10

## 2020-01-03 MED ORDER — HEPARIN SOD (PORK) LOCK FLUSH 100 UNIT/ML IV SOLN
500.0000 [IU] | Freq: Once | INTRAVENOUS | Status: AC | PRN
  Administered 2020-01-03: 500 [IU]
  Filled 2020-01-03: qty 5

## 2020-01-09 DIAGNOSIS — C180 Malignant neoplasm of cecum: Secondary | ICD-10-CM | POA: Diagnosis not present

## 2020-01-10 ENCOUNTER — Other Ambulatory Visit: Payer: Self-pay | Admitting: Oncology

## 2020-01-14 ENCOUNTER — Inpatient Hospital Stay: Payer: Medicare PPO

## 2020-01-14 ENCOUNTER — Other Ambulatory Visit: Payer: Self-pay

## 2020-01-14 ENCOUNTER — Inpatient Hospital Stay: Payer: Medicare PPO | Attending: Nurse Practitioner | Admitting: Oncology

## 2020-01-14 ENCOUNTER — Telehealth: Payer: Self-pay | Admitting: Interventional Cardiology

## 2020-01-14 VITALS — BP 136/65 | HR 67 | Temp 97.6°F | Resp 18 | Ht 71.0 in | Wt 232.7 lb

## 2020-01-14 DIAGNOSIS — C787 Secondary malignant neoplasm of liver and intrahepatic bile duct: Secondary | ICD-10-CM | POA: Insufficient documentation

## 2020-01-14 DIAGNOSIS — Z79899 Other long term (current) drug therapy: Secondary | ICD-10-CM | POA: Insufficient documentation

## 2020-01-14 DIAGNOSIS — Z95828 Presence of other vascular implants and grafts: Secondary | ICD-10-CM

## 2020-01-14 DIAGNOSIS — C180 Malignant neoplasm of cecum: Secondary | ICD-10-CM | POA: Insufficient documentation

## 2020-01-14 DIAGNOSIS — Z5112 Encounter for antineoplastic immunotherapy: Secondary | ICD-10-CM | POA: Insufficient documentation

## 2020-01-14 DIAGNOSIS — Z5111 Encounter for antineoplastic chemotherapy: Secondary | ICD-10-CM | POA: Diagnosis not present

## 2020-01-14 LAB — CBC WITH DIFFERENTIAL (CANCER CENTER ONLY)
Abs Immature Granulocytes: 0.01 10*3/uL (ref 0.00–0.07)
Basophils Absolute: 0 10*3/uL (ref 0.0–0.1)
Basophils Relative: 0 %
Eosinophils Absolute: 0.2 10*3/uL (ref 0.0–0.5)
Eosinophils Relative: 5 %
HCT: 33.4 % — ABNORMAL LOW (ref 39.0–52.0)
Hemoglobin: 11.2 g/dL — ABNORMAL LOW (ref 13.0–17.0)
Immature Granulocytes: 0 %
Lymphocytes Relative: 8 %
Lymphs Abs: 0.4 10*3/uL — ABNORMAL LOW (ref 0.7–4.0)
MCH: 32 pg (ref 26.0–34.0)
MCHC: 33.5 g/dL (ref 30.0–36.0)
MCV: 95.4 fL (ref 80.0–100.0)
Monocytes Absolute: 0.6 10*3/uL (ref 0.1–1.0)
Monocytes Relative: 10 %
Neutro Abs: 4 10*3/uL (ref 1.7–7.7)
Neutrophils Relative %: 77 %
Platelet Count: 115 10*3/uL — ABNORMAL LOW (ref 150–400)
RBC: 3.5 MIL/uL — ABNORMAL LOW (ref 4.22–5.81)
RDW: 17.1 % — ABNORMAL HIGH (ref 11.5–15.5)
WBC Count: 5.3 10*3/uL (ref 4.0–10.5)
nRBC: 0 % (ref 0.0–0.2)

## 2020-01-14 LAB — CMP (CANCER CENTER ONLY)
ALT: 28 U/L (ref 0–44)
AST: 35 U/L (ref 15–41)
Albumin: 3.5 g/dL (ref 3.5–5.0)
Alkaline Phosphatase: 84 U/L (ref 38–126)
Anion gap: 7 (ref 5–15)
BUN: 16 mg/dL (ref 8–23)
CO2: 24 mmol/L (ref 22–32)
Calcium: 9.2 mg/dL (ref 8.9–10.3)
Chloride: 106 mmol/L (ref 98–111)
Creatinine: 0.84 mg/dL (ref 0.61–1.24)
GFR, Est AFR Am: >60 mL/min (ref >60)
GFR, Estimated: >60 mL/min (ref >60)
Glucose, Bld: 129 mg/dL — ABNORMAL HIGH (ref 70–99)
Potassium: 4.3 mmol/L (ref 3.5–5.1)
Sodium: 137 mmol/L (ref 135–145)
Total Bilirubin: 0.6 mg/dL (ref 0.3–1.2)
Total Protein: 6.2 g/dL — ABNORMAL LOW (ref 6.5–8.1)

## 2020-01-14 LAB — MAGNESIUM: Magnesium: 1.8 mg/dL (ref 1.7–2.4)

## 2020-01-14 MED ORDER — ATROPINE SULFATE 1 MG/ML IJ SOLN
INTRAMUSCULAR | Status: AC
  Filled 2020-01-14: qty 1

## 2020-01-14 MED ORDER — ATROPINE SULFATE 1 MG/ML IJ SOLN
0.5000 mg | Freq: Once | INTRAMUSCULAR | Status: AC | PRN
  Administered 2020-01-14: 0.5 mg via INTRAVENOUS

## 2020-01-14 MED ORDER — SODIUM CHLORIDE 0.9 % IV SOLN
3.0000 mg/kg | Freq: Once | INTRAVENOUS | Status: AC
  Administered 2020-01-14: 300 mg via INTRAVENOUS
  Filled 2020-01-14: qty 15

## 2020-01-14 MED ORDER — SODIUM CHLORIDE 0.9 % IV SOLN
Freq: Once | INTRAVENOUS | Status: AC
  Filled 2020-01-14: qty 250

## 2020-01-14 MED ORDER — PALONOSETRON HCL INJECTION 0.25 MG/5ML
0.2500 mg | Freq: Once | INTRAVENOUS | Status: AC
  Administered 2020-01-14: 0.25 mg via INTRAVENOUS

## 2020-01-14 MED ORDER — SODIUM CHLORIDE 0.9 % IV SOLN
Freq: Once | INTRAVENOUS | Status: DC
  Filled 2020-01-14: qty 250

## 2020-01-14 MED ORDER — SODIUM CHLORIDE 0.9 % IV SOLN
10.0000 mg | Freq: Once | INTRAVENOUS | Status: AC
  Administered 2020-01-14: 10 mg via INTRAVENOUS
  Filled 2020-01-14: qty 10

## 2020-01-14 MED ORDER — PALONOSETRON HCL INJECTION 0.25 MG/5ML
INTRAVENOUS | Status: AC
  Filled 2020-01-14: qty 5

## 2020-01-14 MED ORDER — SODIUM CHLORIDE 0.9% FLUSH
10.0000 mL | INTRAVENOUS | Status: DC | PRN
  Administered 2020-01-14: 10 mL
  Filled 2020-01-14: qty 10

## 2020-01-14 MED ORDER — SODIUM CHLORIDE 0.9 % IV SOLN
400.0000 mg/m2 | Freq: Once | INTRAVENOUS | Status: AC
  Administered 2020-01-14: 928 mg via INTRAVENOUS
  Filled 2020-01-14: qty 46.4

## 2020-01-14 MED ORDER — SODIUM CHLORIDE 0.9 % IV SOLN
2000.0000 mg/m2 | INTRAVENOUS | Status: DC
  Administered 2020-01-14: 4650 mg via INTRAVENOUS
  Filled 2020-01-14: qty 93

## 2020-01-14 MED ORDER — SODIUM CHLORIDE 0.9 % IV SOLN
400.0000 mg | Freq: Once | INTRAVENOUS | Status: AC
  Administered 2020-01-14: 400 mg via INTRAVENOUS
  Filled 2020-01-14: qty 15

## 2020-01-14 NOTE — Telephone Encounter (Signed)
Pt c/o Shortness Of Breath: STAT if SOB developed within the last 24 hours or pt is noticeably SOB on the phone  1. Are you currently SOB (can you hear that pt is SOB on the phone)? No  2. How long have you been experiencing SOB? A few month, patient states SOB is gradually becoming worse  3. Are you SOB when sitting or when up moving around? When up and moving around  4. Are you currently experiencing any other symptoms? No

## 2020-01-14 NOTE — Progress Notes (Signed)
[place] OFFICE PROGRESS NOTE   Diagnosis: Colon cancer  INTERVAL HISTORY:   Luis [name] completed another cycle of FOLFIRI/Panitumumab on 01/01/2020.  No nausea/vomiting, mouth sores, or diarrhea.  The skin rash has improved.  He reports exertional dyspnea for several months.  This has progressed recently.  No chest pain. He reports mild lower leg swelling bilaterally since beginning chemotherapy.  He had dyspnea when working around his campsite last weekend.  He has dyspnea when walking up hills.  He does not have dyspnea when walking on flat surfaces.  He walked 2 miles this morning.  Objective:  Vital signs in last 24 hours:  Blood pressure 136/65, pulse 67, temperature 97.6 F (36.4 C), temperature source Temporal, resp. rate 18, height '5\' 11"'$  (1.803 m), weight 232 lb 11.2 oz (105.6 kg), SpO2 100 %.    HEENT: No thrush or ulcers Resp: Lungs clear bilaterally, no respiratory distress Cardio: Regular rate and rhythm, the neck veins are not distended GI: No hepatomegaly Vascular: Trace pitting edema at the lower leg and ankle bilaterally  Skin: Acne type rash over the face and trunk, palms without erythema.  The rash appears less red and is drying.  Portacath/PICC-without erythema  Lab Results:  Lab Results  Component Value Date   WBC 5.3 01/14/2020   HGB 11.2 (L) 01/14/2020   HCT 33.4 (L) 01/14/2020   MCV 95.4 01/14/2020   PLT 115 (L) 01/14/2020   NEUTROABS 4.0 01/14/2020    CMP  Lab Results  Component Value Date   NA 139 01/01/2020   K 4.5 01/01/2020   CL 106 01/01/2020   CO2 25 01/01/2020   GLUCOSE 97 01/01/2020   BUN 18 01/01/2020   CREATININE 0.84 01/01/2020   CALCIUM 9.5 01/01/2020   PROT 6.6 01/01/2020   ALBUMIN 3.5 01/01/2020   AST 33 01/01/2020   ALT 28 01/01/2020   ALKPHOS 85 01/01/2020   BILITOT 0.6 01/01/2020   GFRNONAA >60 01/01/2020   GFRAA >60 01/01/2020    Lab Results  Component Value Date   CEA1 2.59 12/03/2019      Medications: I have reviewed the patient's current medications.   Assessment/Plan: 1. Adenocarcinoma of the cecum, stage IIA(T3N0), status post a right colectomy 07/26/2017 ? 0/16 lymph nodes positive, no lymphovascular invasion, perineural invasion present ? MSI-stable, no loss of mismatch repair protein expression ? Surveillance colonoscopy 07/26/2018-no polyps found ? Elevated CEA June 2020, persistently elevated August 2020 ? CTs 02/07/2019-bilateral liver metastases, no extrahepatic metastatic disease, changes of sarcoidosis in the chest ? Biopsy liver lesion 03/04/2019-metastatic adenocarcinoma, with morphology consistent with metastatic colonic adenocarcinoma, RAS WT ? Cycle 1 FOLFOX 03/12/2019 ? Cycle 2 FOLFOX 03/26/2019, oxaliplatin dose reduced secondary to thrombocytopenia ? Cycle 3 FOLFOX 04/09/2019, oxaliplatin further dose reduced and aspirin held secondary to thrombocytopenia ? Cycle 4 FOLFOX 04/23/2019 ? Cycle 5 FOLFOX 05/07/2019 ? CTs 05/23/2019-decrease in liver lesions. No new or progressive findings. ? Cycle 6 FOLFOX 05/27/2019 ? Cycle 7 FOLFOX 06/14/2019 ? Cycle 8 FOLFOX 07/02/2019-Udenyca held ? Cycle 9 FOLFOX 07/16/2019-Udenyca held ? Cycle 10 FOLFOX 07/30/2019-Udenyca held ? CT abdomen/pelvis 08/13/2019-mild improvement in previously noted liver lesions ? MRI abdomen 08/13/2019-3 liver lesions ? Cycle 1 FOLFIRI/Avastin 09/17/2019 ? Cycle 2 FOLFIRI/Avastin 10/01/2019 ? Cycle 3 FOLFIRI/Avastin 10/15/2019 ? Cycle 4 FOLFIRI/Avastin 11/05/2019 ? Cycle 5 FOLFIRI/Avastin 11/19/2019 ? CTs 11/27/2019-stable liver lesions; no new liver lesions.  No other evidence of metastatic disease in the abdomen or pelvis. ? Cycle 1 FOLFIRI/Panitumumab 12/03/2019 ? Cycle  2 FOLFIRI/Panitumumab 12/17/2019 (Panitumumab dose reduced due to rash) ? Cycle 3 FOLFIRI/Panitumumab 01/01/2020 ? Cycle 4 FOLFIRI/Panitumumab 01/14/2020 2. History of colon polyps 3. Microcytic anemia January  2019 4. Sarcoidosis 5. Coronary artery disease 6. Port-A-Cath placement, interventional radiology, 03/04/2019 7. Thrombocytopenia, mild thrombocytopenia predating chemotherapy 8. Oxaliplatin neuropathy   Disposition: Luis [name] has completed 3 cycles of FOLFIRI/Panitumumab.  The skin rash has improved.  He is tolerating the chemotherapy well.  He has developed progressive exertional dyspnea.  I doubt this is related to the chemotherapy or the mild anemia.  I am concerned the dyspnea may be related to heart disease.  I recommended he contact Dr. Irish Lack to evaluate the exertional dyspnea.  Luis [name] will complete another cycle of FOLFIRI/Panitumumab today.  He will return for an office visit and chemotherapy in 2 weeks.  He will undergo a restaging CT evaluation after the next cycle of chemotherapy.  Betsy Coder, MD  01/14/2020  10:04 AM

## 2020-01-14 NOTE — Telephone Encounter (Signed)
Attempted phone call to pt and left voicemail message to contact triage at [phone]. 

## 2020-01-15 ENCOUNTER — Telehealth: Payer: Self-pay | Admitting: Oncology

## 2020-01-15 ENCOUNTER — Telehealth: Payer: Self-pay | Admitting: Interventional Cardiology

## 2020-01-15 NOTE — Telephone Encounter (Signed)
Spoke with the pt and he reports that a few days ago he was packing for a camping trip and he was exerting himself quite a bit and he got very short of breath and he had to stop for a while to rest.. he says his HR was 113 but it got better after a few minutes to 73 and he felt much better. He says that he walks everyday and sometimes on an incline and does not have any difficulty with it.   He says that his initial time that he was diagnosed with a blockage he had SOB with a "normal" stress test... he is asking to possibly have the MD consider another Cath if Dr. Irish Lack feels necessary. He says it felt like it did when he needed his stent.   I advised him that I will need to forward to Dr. Irish Lack and Gerrianne Scale PA per his request since he last saw her and reports that he felt comfortable with her advice.   I have asked that he considers going to the ED prior to hearing back from [place] if anything is to change or worsens... I have also instructed him to take a nitro of needed.   I am unable to find him an appt in the next few weeks.   Will forward to Dr. Irish Lack to review when he returns to the office.

## 2020-01-15 NOTE — Telephone Encounter (Signed)
Follow Up:     Pt said somebody called him from here, he did not know who called.

## 2020-01-15 NOTE — Telephone Encounter (Signed)
Scheduled per 8/4 los. Noted to give pt appt calendar on next visit. 

## 2020-01-15 NOTE — Telephone Encounter (Signed)
Follow up  ° ° °Pt returning call  °

## 2020-01-16 ENCOUNTER — Other Ambulatory Visit: Payer: Self-pay | Admitting: Interventional Cardiology

## 2020-01-16 ENCOUNTER — Other Ambulatory Visit: Payer: Self-pay

## 2020-01-16 ENCOUNTER — Inpatient Hospital Stay: Payer: Medicare PPO

## 2020-01-16 VITALS — BP 139/63 | HR 62 | Temp 98.2°F | Resp 16

## 2020-01-16 DIAGNOSIS — C787 Secondary malignant neoplasm of liver and intrahepatic bile duct: Secondary | ICD-10-CM | POA: Diagnosis not present

## 2020-01-16 DIAGNOSIS — Z5112 Encounter for antineoplastic immunotherapy: Secondary | ICD-10-CM | POA: Diagnosis not present

## 2020-01-16 DIAGNOSIS — C180 Malignant neoplasm of cecum: Secondary | ICD-10-CM

## 2020-01-16 DIAGNOSIS — Z5111 Encounter for antineoplastic chemotherapy: Secondary | ICD-10-CM | POA: Diagnosis not present

## 2020-01-16 DIAGNOSIS — Z79899 Other long term (current) drug therapy: Secondary | ICD-10-CM | POA: Diagnosis not present

## 2020-01-16 MED ORDER — HEPARIN SOD (PORK) LOCK FLUSH 100 UNIT/ML IV SOLN
500.0000 [IU] | Freq: Once | INTRAVENOUS | Status: AC | PRN
  Administered 2020-01-16: 500 [IU]
  Filled 2020-01-16: qty 5

## 2020-01-16 MED ORDER — SODIUM CHLORIDE 0.9% FLUSH
10.0000 mL | INTRAVENOUS | Status: DC | PRN
  Administered 2020-01-16: 10 mL
  Filled 2020-01-16: qty 10

## 2020-01-16 NOTE — Telephone Encounter (Signed)
I will let Dr. Irish Lack weigh in on this as he only had one episode of shortness of breath when exerting himself pretty hard and no symptoms with walking daily. Continue to monitor closely and wait for Dr. Irish Lack to review. Thanks, Selinda Eon

## 2020-01-16 NOTE — Patient Instructions (Signed)

## 2020-01-17 NOTE — Telephone Encounter (Signed)
WOuld continue to monitor symptoms.  Make sure he has SL NTG to use at home if needed.  If he has recurrent sx, could consider another ischemic eval or adding additional medical therapy.   JV

## 2020-01-19 DIAGNOSIS — M48061 Spinal stenosis, lumbar region without neurogenic claudication: Secondary | ICD-10-CM | POA: Diagnosis not present

## 2020-01-19 DIAGNOSIS — M545 Low back pain: Secondary | ICD-10-CM | POA: Diagnosis not present

## 2020-01-19 NOTE — Telephone Encounter (Signed)
Called and spoke to patient. He states that he continues to have SOB on exertion. He states that it has been going on for the past 3 months. He states that it is not unbearable and that he has not had another episode that was bad like the other day. I asked the patient to continue to monitor his Sx and let us know if they change or worsen. The patient does have SL NTG at home to use if needed. Made patient aware that if his Sx return we will consider ischemic work-up or changing his meds. Patient verbalized understanding to all instruction and is agreement with this plan.

## 2020-01-23 DIAGNOSIS — R7303 Prediabetes: Secondary | ICD-10-CM | POA: Diagnosis not present

## 2020-01-23 DIAGNOSIS — E785 Hyperlipidemia, unspecified: Secondary | ICD-10-CM | POA: Diagnosis not present

## 2020-01-23 DIAGNOSIS — I10 Essential (primary) hypertension: Secondary | ICD-10-CM | POA: Diagnosis not present

## 2020-01-23 DIAGNOSIS — M545 Low back pain: Secondary | ICD-10-CM | POA: Diagnosis not present

## 2020-01-23 DIAGNOSIS — C189 Malignant neoplasm of colon, unspecified: Secondary | ICD-10-CM | POA: Diagnosis not present

## 2020-01-23 DIAGNOSIS — C787 Secondary malignant neoplasm of liver and intrahepatic bile duct: Secondary | ICD-10-CM | POA: Diagnosis not present

## 2020-01-23 DIAGNOSIS — M48061 Spinal stenosis, lumbar region without neurogenic claudication: Secondary | ICD-10-CM | POA: Diagnosis not present

## 2020-01-23 DIAGNOSIS — Z79899 Other long term (current) drug therapy: Secondary | ICD-10-CM | POA: Diagnosis not present

## 2020-01-23 DIAGNOSIS — Z125 Encounter for screening for malignant neoplasm of prostate: Secondary | ICD-10-CM | POA: Diagnosis not present

## 2020-01-23 DIAGNOSIS — D696 Thrombocytopenia, unspecified: Secondary | ICD-10-CM | POA: Diagnosis not present

## 2020-01-23 DIAGNOSIS — Z9181 History of falling: Secondary | ICD-10-CM | POA: Diagnosis not present

## 2020-01-23 DIAGNOSIS — M5416 Radiculopathy, lumbar region: Secondary | ICD-10-CM | POA: Diagnosis not present

## 2020-01-24 ENCOUNTER — Other Ambulatory Visit: Payer: Self-pay | Admitting: Oncology

## 2020-01-28 ENCOUNTER — Inpatient Hospital Stay: Payer: Medicare PPO

## 2020-01-28 ENCOUNTER — Inpatient Hospital Stay: Payer: Medicare PPO | Admitting: Nurse Practitioner

## 2020-01-28 ENCOUNTER — Other Ambulatory Visit: Payer: Self-pay | Admitting: Nurse Practitioner

## 2020-01-28 ENCOUNTER — Encounter: Payer: Self-pay | Admitting: Nurse Practitioner

## 2020-01-28 ENCOUNTER — Other Ambulatory Visit: Payer: Self-pay

## 2020-01-28 VITALS — BP 129/63 | HR 67 | Temp 97.8°F | Resp 18 | Ht 71.0 in | Wt 231.7 lb

## 2020-01-28 DIAGNOSIS — C787 Secondary malignant neoplasm of liver and intrahepatic bile duct: Secondary | ICD-10-CM | POA: Diagnosis not present

## 2020-01-28 DIAGNOSIS — C180 Malignant neoplasm of cecum: Secondary | ICD-10-CM

## 2020-01-28 DIAGNOSIS — Z5112 Encounter for antineoplastic immunotherapy: Secondary | ICD-10-CM | POA: Diagnosis not present

## 2020-01-28 DIAGNOSIS — Z79899 Other long term (current) drug therapy: Secondary | ICD-10-CM | POA: Diagnosis not present

## 2020-01-28 DIAGNOSIS — Z95828 Presence of other vascular implants and grafts: Secondary | ICD-10-CM

## 2020-01-28 DIAGNOSIS — Z5111 Encounter for antineoplastic chemotherapy: Secondary | ICD-10-CM | POA: Diagnosis not present

## 2020-01-28 LAB — CBC WITH DIFFERENTIAL (CANCER CENTER ONLY)
Abs Immature Granulocytes: 0.01 10*3/uL (ref 0.00–0.07)
Basophils Absolute: 0 10*3/uL (ref 0.0–0.1)
Basophils Relative: 1 %
Eosinophils Absolute: 0.3 10*3/uL (ref 0.0–0.5)
Eosinophils Relative: 6 %
HCT: 34.2 % — ABNORMAL LOW (ref 39.0–52.0)
Hemoglobin: 11.2 g/dL — ABNORMAL LOW (ref 13.0–17.0)
Immature Granulocytes: 0 %
Lymphocytes Relative: 10 %
Lymphs Abs: 0.5 10*3/uL — ABNORMAL LOW (ref 0.7–4.0)
MCH: 31.6 pg (ref 26.0–34.0)
MCHC: 32.7 g/dL (ref 30.0–36.0)
MCV: 96.6 fL (ref 80.0–100.0)
Monocytes Absolute: 0.6 10*3/uL (ref 0.1–1.0)
Monocytes Relative: 13 %
Neutro Abs: 3.3 10*3/uL (ref 1.7–7.7)
Neutrophils Relative %: 70 %
Platelet Count: 147 10*3/uL — ABNORMAL LOW (ref 150–400)
RBC: 3.54 MIL/uL — ABNORMAL LOW (ref 4.22–5.81)
RDW: 17.5 % — ABNORMAL HIGH (ref 11.5–15.5)
WBC Count: 4.7 10*3/uL (ref 4.0–10.5)
nRBC: 0 % (ref 0.0–0.2)

## 2020-01-28 LAB — CMP (CANCER CENTER ONLY)
ALT: 25 U/L (ref 0–44)
AST: 31 U/L (ref 15–41)
Albumin: 3.4 g/dL — ABNORMAL LOW (ref 3.5–5.0)
Alkaline Phosphatase: 93 U/L (ref 38–126)
Anion gap: 5 (ref 5–15)
BUN: 14 mg/dL (ref 8–23)
CO2: 26 mmol/L (ref 22–32)
Calcium: 9.2 mg/dL (ref 8.9–10.3)
Chloride: 106 mmol/L (ref 98–111)
Creatinine: 0.8 mg/dL (ref 0.61–1.24)
GFR, Est AFR Am: >60 mL/min (ref >60)
GFR, Estimated: >60 mL/min (ref >60)
Glucose, Bld: 105 mg/dL — ABNORMAL HIGH (ref 70–99)
Potassium: 4.1 mmol/L (ref 3.5–5.1)
Sodium: 137 mmol/L (ref 135–145)
Total Bilirubin: 0.6 mg/dL (ref 0.3–1.2)
Total Protein: 6.2 g/dL — ABNORMAL LOW (ref 6.5–8.1)

## 2020-01-28 LAB — MAGNESIUM: Magnesium: 1.7 mg/dL (ref 1.7–2.4)

## 2020-01-28 LAB — CEA (IN HOUSE-CHCC): CEA (CHCC-In House): 2.26 ng/mL (ref 0.00–5.00)

## 2020-01-28 MED ORDER — SODIUM CHLORIDE 0.9 % IV SOLN
10.0000 mg | Freq: Once | INTRAVENOUS | Status: AC
  Administered 2020-01-28: 10 mg via INTRAVENOUS
  Filled 2020-01-28: qty 10

## 2020-01-28 MED ORDER — ATROPINE SULFATE 1 MG/ML IJ SOLN
0.5000 mg | Freq: Once | INTRAMUSCULAR | Status: AC | PRN
  Administered 2020-01-28: 0.5 mg via INTRAVENOUS

## 2020-01-28 MED ORDER — SODIUM CHLORIDE 0.9% FLUSH
10.0000 mL | INTRAVENOUS | Status: DC | PRN
  Administered 2020-01-28: 10 mL
  Filled 2020-01-28: qty 10

## 2020-01-28 MED ORDER — ATROPINE SULFATE 0.4 MG/ML IJ SOLN
INTRAMUSCULAR | Status: AC
  Filled 2020-01-28: qty 1

## 2020-01-28 MED ORDER — SODIUM CHLORIDE 0.9% FLUSH
10.0000 mL | INTRAVENOUS | Status: DC | PRN
  Filled 2020-01-28: qty 10

## 2020-01-28 MED ORDER — ATROPINE SULFATE 1 MG/ML IJ SOLN
INTRAMUSCULAR | Status: AC
  Filled 2020-01-28: qty 1

## 2020-01-28 MED ORDER — SODIUM CHLORIDE 0.9 % IV SOLN
2000.0000 mg/m2 | INTRAVENOUS | Status: DC
  Administered 2020-01-28: 4650 mg via INTRAVENOUS
  Filled 2020-01-28: qty 93

## 2020-01-28 MED ORDER — SODIUM CHLORIDE 0.9 % IV SOLN
176.0000 mg/m2 | Freq: Once | INTRAVENOUS | Status: AC
  Administered 2020-01-28: 400 mg via INTRAVENOUS
  Filled 2020-01-28: qty 15

## 2020-01-28 MED ORDER — SODIUM CHLORIDE 0.9 % IV SOLN
Freq: Once | INTRAVENOUS | Status: AC
  Filled 2020-01-28: qty 250

## 2020-01-28 MED ORDER — SODIUM CHLORIDE 0.9 % IV SOLN
400.0000 mg/m2 | Freq: Once | INTRAVENOUS | Status: AC
  Administered 2020-01-28: 928 mg via INTRAVENOUS
  Filled 2020-01-28: qty 46.4

## 2020-01-28 MED ORDER — PALONOSETRON HCL INJECTION 0.25 MG/5ML
0.2500 mg | Freq: Once | INTRAVENOUS | Status: AC
  Administered 2020-01-28: 0.25 mg via INTRAVENOUS

## 2020-01-28 MED ORDER — SODIUM CHLORIDE 0.9 % IV SOLN
3.0000 mg/kg | Freq: Once | INTRAVENOUS | Status: AC
  Administered 2020-01-28: 300 mg via INTRAVENOUS
  Filled 2020-01-28: qty 15

## 2020-01-28 MED ORDER — HEPARIN SOD (PORK) LOCK FLUSH 100 UNIT/ML IV SOLN
500.0000 [IU] | Freq: Once | INTRAVENOUS | Status: DC | PRN
  Filled 2020-01-28: qty 5

## 2020-01-28 MED ORDER — PALONOSETRON HCL INJECTION 0.25 MG/5ML
INTRAVENOUS | Status: AC
  Filled 2020-01-28: qty 5

## 2020-01-28 NOTE — Progress Notes (Signed)
[place] OFFICE PROGRESS NOTE   Diagnosis: Colon cancer  INTERVAL HISTORY:   Luis [name] returns as scheduled.  He completed cycle 4 FOLFIRI/Panitumumab 01/14/2020.  He has occasional mild nausea.  No vomiting.  No mouth sores.  No diarrhea.  Some issues with constipation.  He is taking Dulcolax.  Skin rash overall is better.  He continues doxycycline.  He continues to have mild exertional dyspnea.  No fever.  He has recently noticed a cough and mild wheezing in the morning hours.  He plans to contact pulmonary.  He reports receiving the Covid booster recently.  Objective:  Vital signs in last 24 hours:  Blood pressure 129/63, pulse 67, temperature 97.8 F (36.6 C), temperature source Tympanic, resp. rate 18, height _0  (1.803 m), weight 231 lb 11.2 oz (105.1 kg), SpO2 96 %.    HEENT: No thrush or ulcers. Resp: A few expiratory wheezes right lung field.  No respiratory distress. Cardio: Regular rate and rhythm. GI: Abdomen soft and nontender.  No hepatomegaly. Vascular: Trace bilateral pretibial edema.  Skin: Acne type rash face and trunk. Port-A-Cath without erythema.   Lab Results:  Lab Results  Component Value Date   WBC 5.3 01/14/2020   HGB 11.2 (L) 01/14/2020   HCT 33.4 (L) 01/14/2020   MCV 95.4 01/14/2020   PLT 115 (L) 01/14/2020   NEUTROABS 4.0 01/14/2020    Imaging:  No results found.  Medications: I have reviewed the patient's current medications.  Assessment/Plan: 1. Adenocarcinoma of the cecum, stage IIA(T3N0), status post a right colectomy 07/26/2017 ? 0/16 lymph nodes positive, no lymphovascular invasion, perineural invasion present ? MSI-stable, no loss of mismatch repair protein expression ? Surveillance colonoscopy 07/26/2018-no polyps found ? Elevated CEA June 2020, persistently elevated August 2020 ? CTs 02/07/2019-bilateral liver metastases, no extrahepatic metastatic disease, changes of sarcoidosis in the chest ? Biopsy liver  lesion 03/04/2019-metastatic adenocarcinoma, with morphology consistent with metastatic colonic adenocarcinoma, RAS WT ? Cycle 1 FOLFOX 03/12/2019 ? Cycle 2 FOLFOX 03/26/2019, oxaliplatin dose reduced secondary to thrombocytopenia ? Cycle 3 FOLFOX 04/09/2019, oxaliplatin further dose reduced and aspirin held secondary to thrombocytopenia ? Cycle 4 FOLFOX 04/23/2019 ? Cycle 5 FOLFOX 05/07/2019 ? CTs 05/23/2019-decrease in liver lesions. No new or progressive findings. ? Cycle 6 FOLFOX 05/27/2019 ? Cycle 7 FOLFOX 06/14/2019 ? Cycle 8 FOLFOX 07/02/2019-Udenyca held ? Cycle 9 FOLFOX 07/16/2019-Udenyca held ? Cycle 10 FOLFOX 07/30/2019-Udenyca held ? CT abdomen/pelvis 08/13/2019-mild improvement in previously noted liver lesions ? MRI abdomen 08/13/2019-3 liver lesions ? Cycle 1 FOLFIRI/Avastin 09/17/2019 ? Cycle 2 FOLFIRI/Avastin 10/01/2019 ? Cycle 3 FOLFIRI/Avastin 10/15/2019 ? Cycle 4 FOLFIRI/Avastin 11/05/2019 ? Cycle 5 FOLFIRI/Avastin 11/19/2019 ? CTs 11/27/2019-stable liver lesions; no new liver lesions. No other evidence of metastatic disease in the abdomen or pelvis. ? Cycle 1 FOLFIRI/Panitumumab 12/03/2019 ? Cycle 2 FOLFIRI/Panitumumab 12/17/2019 (Panitumumab dose reduced due to rash) ? Cycle 3 FOLFIRI/Panitumumab 01/01/2020 ? Cycle 4 FOLFIRI/Panitumumab 01/14/2020 ? Cycle 5 FOLFIRI/Panitumumab 01/28/2020 2. History of colon polyps 3. Microcytic anemia January 2019 4. Sarcoidosis 5. Coronary artery disease 6. Port-A-Cath placement, interventional radiology, 03/04/2019 7. Thrombocytopenia, mild thrombocytopenia predating chemotherapy 8. Oxaliplatin neuropathy   Disposition: [name] appears stable.  He has completed 4 cycles of FOLFIRI/Panitumumab.  He continues to tolerate the chemotherapy well.  Plan to proceed with cycle 5 today as scheduled.  Restaging CTs prior to his next visit.  We reviewed the CBC from today.  Counts adequate to proceed with treatment.    The mild dyspnea  on exertion is  better.  Recently he has noticed mild wheezing in the morning hours.  He plans to contact his pulmonologist.  He will return for follow-up in 2 weeks.  He will contact the office in the interim with any problems.    Ned Card ANP/GNP-BC   01/28/2020  9:34 AM

## 2020-01-28 NOTE — Patient Instructions (Signed)

## 2020-01-28 NOTE — Patient Instructions (Signed)
[facility] Discharge Instructions for Patients Receiving Chemotherapy  Today you received the following chemotherapy agents: Irinotecan, Leucovorin, Fluorouracil, and Vectibix  To help prevent nausea and vomiting after your treatment, we encourage you to take your nausea medication as prescribed.    If you develop nausea and vomiting that is not controlled by your nausea medication, call the clinic.   BELOW ARE SYMPTOMS THAT SHOULD BE REPORTED IMMEDIATELY:  *FEVER GREATER THAN 100.5 F  *CHILLS WITH OR WITHOUT FEVER  NAUSEA AND VOMITING THAT IS NOT CONTROLLED WITH YOUR NAUSEA MEDICATION  *UNUSUAL SHORTNESS OF BREATH  *UNUSUAL BRUISING OR BLEEDING  TENDERNESS IN MOUTH AND THROAT WITH OR WITHOUT PRESENCE OF ULCERS  *URINARY PROBLEMS  *BOWEL PROBLEMS  UNUSUAL RASH Items with * indicate a potential emergency and should be followed up as soon as possible.  Feel free to call the clinic should you have any questions or concerns. The clinic phone number is [phone].  Please show the [place] at check-in to the Emergency Department and triage nurse.

## 2020-01-29 ENCOUNTER — Telehealth: Payer: Self-pay | Admitting: Nurse Practitioner

## 2020-01-29 NOTE — Telephone Encounter (Signed)
Scheduled appointments per 8/18 los. Patient is aware of appointments dates and times.

## 2020-01-30 ENCOUNTER — Other Ambulatory Visit: Payer: Self-pay

## 2020-01-30 ENCOUNTER — Inpatient Hospital Stay: Payer: Medicare PPO

## 2020-01-30 DIAGNOSIS — Z95828 Presence of other vascular implants and grafts: Secondary | ICD-10-CM

## 2020-01-30 DIAGNOSIS — C787 Secondary malignant neoplasm of liver and intrahepatic bile duct: Secondary | ICD-10-CM | POA: Diagnosis not present

## 2020-01-30 DIAGNOSIS — Z5112 Encounter for antineoplastic immunotherapy: Secondary | ICD-10-CM | POA: Diagnosis not present

## 2020-01-30 DIAGNOSIS — Z5111 Encounter for antineoplastic chemotherapy: Secondary | ICD-10-CM | POA: Diagnosis not present

## 2020-01-30 DIAGNOSIS — Z79899 Other long term (current) drug therapy: Secondary | ICD-10-CM | POA: Diagnosis not present

## 2020-01-30 DIAGNOSIS — C180 Malignant neoplasm of cecum: Secondary | ICD-10-CM | POA: Diagnosis not present

## 2020-01-30 MED ORDER — HEPARIN SOD (PORK) LOCK FLUSH 100 UNIT/ML IV SOLN
500.0000 [IU] | Freq: Once | INTRAVENOUS | Status: AC | PRN
  Administered 2020-01-30: 500 [IU]
  Filled 2020-01-30: qty 5

## 2020-01-30 MED ORDER — SODIUM CHLORIDE 0.9% FLUSH
10.0000 mL | INTRAVENOUS | Status: DC | PRN
  Administered 2020-01-30: 10 mL
  Filled 2020-01-30: qty 10

## 2020-02-02 DIAGNOSIS — M48061 Spinal stenosis, lumbar region without neurogenic claudication: Secondary | ICD-10-CM | POA: Diagnosis not present

## 2020-02-02 DIAGNOSIS — M545 Low back pain: Secondary | ICD-10-CM | POA: Diagnosis not present

## 2020-02-04 ENCOUNTER — Other Ambulatory Visit: Payer: Self-pay

## 2020-02-04 ENCOUNTER — Ambulatory Visit: Payer: Medicare PPO | Admitting: Pulmonary Disease

## 2020-02-04 ENCOUNTER — Ambulatory Visit (INDEPENDENT_AMBULATORY_CARE_PROVIDER_SITE_OTHER): Payer: Medicare PPO

## 2020-02-04 ENCOUNTER — Encounter: Payer: Self-pay | Admitting: Pulmonary Disease

## 2020-02-04 VITALS — BP 126/72 | HR 72 | Temp 98.4°F | Ht 71.0 in | Wt 234.0 lb

## 2020-02-04 DIAGNOSIS — C180 Malignant neoplasm of cecum: Secondary | ICD-10-CM | POA: Diagnosis not present

## 2020-02-04 DIAGNOSIS — R0609 Other forms of dyspnea: Secondary | ICD-10-CM

## 2020-02-04 DIAGNOSIS — D869 Sarcoidosis, unspecified: Secondary | ICD-10-CM | POA: Diagnosis not present

## 2020-02-04 DIAGNOSIS — R0600 Dyspnea, unspecified: Secondary | ICD-10-CM

## 2020-02-04 DIAGNOSIS — J90 Pleural effusion, not elsewhere classified: Secondary | ICD-10-CM | POA: Diagnosis not present

## 2020-02-04 DIAGNOSIS — F17200 Nicotine dependence, unspecified, uncomplicated: Secondary | ICD-10-CM | POA: Diagnosis not present

## 2020-02-04 NOTE — Assessment & Plan Note (Signed)
Plan: We recommend that you stop smoking 

## 2020-02-04 NOTE — Patient Instructions (Addendum)
You were seen today by Lauraine Rinne, NP  for:   1. Sarcoidosis 2. DOE (dyspnea on exertion)  - DG Chest 2 View; Future - Pulmonary function test; Future  Walk today in office  Chest x-ray today  We will order pulmonary function testing to compare to 2019 pulmonary function test  We will have you establish with a new pulmonologist in our office  3. Adenocarcinoma of cecum [facility])  Keep follow-up with oncology   We recommend today:  Orders Placed This Encounter  Procedures  . DG Chest 2 View    Standing Status:   Future    Standing Expiration Date:   06/05/2020    Order Specific Question:   Reason for Exam (SYMPTOM  OR DIAGNOSIS REQUIRED)    Answer:   sarcoid    Order Specific Question:   Preferred imaging location?    Answer:   Internal    Order Specific Question:   Radiology Contrast Protocol - do NOT remove file path    Answer:   [email or web address]  . Pulmonary function test    Standing Status:   Future    Standing Expiration Date:   02/03/2021    Order Specific Question:   Where should this test be performed?    Answer:   [facility]    Order Specific Question:   Full PFT: includes the following: basic spirometry, spirometry pre & post bronchodilator, diffusion capacity (DLCO), lung volumes    Answer:   Full PFT   Orders Placed This Encounter  Procedures  . DG Chest 2 View  . Pulmonary function test   No orders of the defined types were placed in this encounter.   Follow Up:    Return in about 6 weeks (around 03/17/2020), or if symptoms worsen or fail to improve, for Follow up for FULL PFT - 60 min, NEW PULMONOLOGIST IN 70min SLOT.   Please do your part to reduce the spread of COVID-19:      Reduce your risk of any infection  and COVID19 by using the similar precautions used for avoiding the common cold or flu:  [name] Wash your hands often with soap and warm water for at least 20 seconds.  If soap and water are not  readily available, use an alcohol-based hand sanitizer with at least 60% alcohol.  . If coughing or sneezing, cover your mouth and nose by coughing or sneezing into the elbow areas of your shirt or coat, into a tissue or into your sleeve (not your hands). Langley Gauss A MASK when in public  . Avoid shaking hands with others and consider head nods or verbal greetings only. . Avoid touching your eyes, nose, or mouth with unwashed hands.  . Avoid close contact with people who are sick. . Avoid places or events with large numbers of people in one location, like concerts or sporting events. . If you have some symptoms but not all symptoms, continue to monitor at home and seek medical attention if your symptoms worsen. . If you are having a medical emergency, call 911.   [place] / e-Visit: [email or web address]         [facility]: [phone]  [facility]: [phone]                   [facility]: [phone]     It is flu season:   >>> Best ways to protect herself from  the flu: Receive the yearly flu vaccine, practice good hand hygiene washing with soap and also using hand sanitizer when available, eat a nutritious meals, get adequate rest, hydrate appropriately   Please contact the office if your symptoms worsen or you have concerns that you are not improving.   Thank you for choosing [facility] for your healthcare, and for allowing [place] to partner with you on your healthcare journey. I am thankful to be able to provide care to you today.   [name] FNP-C

## 2020-02-04 NOTE — Assessment & Plan Note (Signed)
Plan: Continue follow-up with oncology 

## 2020-02-04 NOTE — Progress Notes (Signed)
$'@Patient'r$  ID: Luis [name], male    DOB: December 09, 1940, 79 y.o.   MRN: [id number]  Chief Complaint  Patient presents with  . Follow-up    Sarcoidosis, more SOB and wheezing    Referring provider: HamrickLorin Mercy, MD  HPI:  79 year old male current smoker (1 cigar a month) followed in our office for sarcoidosis and bronchitis  PMH: CAD, hypertension, hyperlipidemia, adenocarcinoma of the cecum  smoker/ Smoking History: Current smoker Maintenance:   Pt of: Former patient of [name]  02/04/2020  - Visit   79 year old male current smoker followed in our office for sarcoidosis and annual bronchitis.  Patient was last seen in our office in March/2019 by [name].  At that point time it was recommended that he stop Flovent.  He have a walking test in office.  They obtain a chest x-ray and a pulmonary function test.  It was recommended that he follow-up in 1 year or sooner if needed.  Patient has been following with oncology Dr. Ammie Dalton for an adenocarcinoma of the cecum.  Those most recent follow-ups are on 01/14/2020 as well as 01/28/2020 see those assessment and plan as listed below:  01/14/20 - Oncology  Assessment/Plan: 1. Adenocarcinoma of the cecum, stage IIA(T3N0), status post a right colectomy 07/26/2017 ? 0/16 lymph nodes positive, no lymphovascular invasion, perineural invasion present ? MSI-stable, no loss of mismatch repair protein expression ? Surveillance colonoscopy 07/26/2018-no polyps found ? Elevated CEA June 2020, persistently elevated August 2020 ? CTs 02/07/2019-bilateral liver metastases, no extrahepatic metastatic disease, changes of sarcoidosis in the chest ? Biopsy liver lesion 03/04/2019-metastatic adenocarcinoma, with morphology consistent with metastatic colonic adenocarcinoma, RAS WT ? Cycle 1 FOLFOX 03/12/2019 ? Cycle 2 FOLFOX 03/26/2019, oxaliplatin dose reduced secondary to thrombocytopenia ? Cycle 3 FOLFOX 04/09/2019, oxaliplatin further dose reduced and  aspirin held secondary to thrombocytopenia ? Cycle 4 FOLFOX 04/23/2019 ? Cycle 5 FOLFOX 05/07/2019 ? CTs 05/23/2019-decrease in liver lesions. No new or progressive findings. ? Cycle 6 FOLFOX 05/27/2019 ? Cycle 7 FOLFOX 06/14/2019 ? Cycle 8 FOLFOX 07/02/2019-Udenyca held ? Cycle 9 FOLFOX 07/16/2019-Udenyca held ? Cycle 10 FOLFOX 07/30/2019-Udenyca held ? CT abdomen/pelvis 08/13/2019-mild improvement in previously noted liver lesions ? MRI abdomen 08/13/2019-3 liver lesions ? Cycle 1 FOLFIRI/Avastin 09/17/2019 ? Cycle 2 FOLFIRI/Avastin 10/01/2019 ? Cycle 3 FOLFIRI/Avastin 10/15/2019 ? Cycle 4 FOLFIRI/Avastin 11/05/2019 ? Cycle 5 FOLFIRI/Avastin 11/19/2019 ? CTs 11/27/2019-stable liver lesions; no new liver lesions. No other evidence of metastatic disease in the abdomen or pelvis. ? Cycle 1 FOLFIRI/Panitumumab 12/03/2019 ? Cycle 2 FOLFIRI/Panitumumab 12/17/2019 (Panitumumab dose reduced due to rash) ? Cycle 3 FOLFIRI/Panitumumab 01/01/2020 ? Cycle 4 FOLFIRI/Panitumumab 01/14/2020 2. History of colon polyps 3. Microcytic anemia January 2019 4. Sarcoidosis 5. Coronary artery disease 6. Port-A-Cath placement, interventional radiology, 03/04/2019 7. Thrombocytopenia, mild thrombocytopenia predating chemotherapy 8. Oxaliplatin neuropathy   Disposition: [name] has completed 3 cycles of FOLFIRI/Panitumumab.  The skin rash has improved.  He is tolerating the chemotherapy well.  He has developed progressive exertional dyspnea.  I doubt this is related to the chemotherapy or the mild anemia.  I am concerned the dyspnea may be related to heart disease.  I recommended he contact Dr. Irish Lack to evaluate the exertional dyspnea.  Mr. Fuster will complete another cycle of FOLFIRI/Panitumumab today.  He will return for an office visit and chemotherapy in 2 weeks.  He will undergo a restaging CT evaluation after the next cycle of chemotherapy.  Betsy Coder, MD  01/28/20 - Oncology NP Disposition:  [name]  appears stable.  He has completed 4 cycles of FOLFIRI/Panitumumab.  He continues to tolerate the chemotherapy well.  Plan to proceed with cycle 5 today as scheduled.  Restaging CTs prior to his next visit.  We reviewed the CBC from today.  Counts adequate to proceed with treatment.    The mild dyspnea on exertion is better.  Recently he has noticed mild wheezing in the morning hours.  He plans to contact his pulmonologist.  He will return for follow-up in 2 weeks.  He will contact the office in the interim with any problems.     It appears that patient has not yet been scheduled with cardiology for further evaluation of his progressive dyspnea.  Patient has noticed that he is having worsening cough as well as shortness of breath.  We will discuss and evaluate this today.  Patient was walked in office today without any oxygen desaturations on room air.  He is already maintained on chronic doxycycline due to reactions from his chemotherapy medications.  He occasionally has a cough and wheezing.  He has aspects of postnasal drip.  The cough is occasionally productive with yellow to green mucus.  He has some occasional lower extremity swelling.  Patient reports that he has contacted cardiology who did not feel this was a cardiovascular issue.  They have encouraged him to monitor his symptoms and if chest pain develops to schedule an appointment with them.  Questionaires / Pulmonary Flowsheets:   ACT:  No flowsheet data found.  MMRC: No flowsheet data found.  Epworth:  No flowsheet data found.  Tests:   02/07/2019-CT chest with contrast-mild progression of sarcoidosis since 06/22/2017  08/30/2017-pulmonary function test-FVC 3.14 (72% predicted), postbronchodilator ratio 73, postbronchodilator FEV1 2.31 (73% predicted),  no postbronchodilator response, TLC 4.99 (68% predicted), DLCO 17.73 (52% predicted) Mild obstructive airways disease, moderate restriction, moderate diffusion  defect  01/02/2019-cardiac cath   Patent diagonal stent.  Previously placed Ramus drug eluting stent is widely patent.  Prox LAD lesion is 75% stenosed.  A drug-eluting stent was successfully placed using a STENT SYNERGY DES 3X24.  Post intervention, there is a 0% residual stenosis.  1st Diag-1 lesion is 75% stenosed. Balloon angioplasty was performed using a BALLOON SAPPHIRE 2.0X12.[name]  Post intervention, there is a 40% residual stenosis.  The left ventricular systolic function is normal.  LV end diastolic pressure is mildly elevated. LVEDP 15 mm Hg.  The left ventricular ejection fraction is 55-65% by visual estimate.  There is no aortic valve stenosis.   FENO:  No results found for: NITRICOXIDE  PFT: PFT Results Latest Ref Rng & Units 08/30/2017  FVC-Pre L 3.14  FVC-Predicted Pre % 72  FVC-Post L 3.18  FVC-Predicted Post % 73  Pre FEV1/FVC % % 70  Post FEV1/FCV % % 73  FEV1-Pre L 2.19  FEV1-Predicted Pre % 69  FEV1-Post L 2.31  DLCO uncorrected ml/min/mmHg 17.73  DLCO UNC% % 52  DLCO corrected ml/min/mmHg 20.68  DLCO COR %Predicted % 61  DLVA Predicted % 98  TLC L 4.99  TLC % Predicted % 68  RV % Predicted % 71    WALK:  SIX MIN WALK 08/28/2017  Supplimental Oxygen during Test? (L/min) No  Tech Comments: pt walked a fast pace, tolerated walk well.     Imaging: No results found.  Lab Results:  CBC    Component Value Date/Time   WBC 4.7 01/28/2020 0844   WBC 6.4 03/04/2019 1100   RBC 3.54 (  L) 01/28/2020 0844   HGB 11.2 (L) 01/28/2020 0844   HGB 15.5 12/25/2018 1306   HCT 34.2 (L) 01/28/2020 0844   HCT 45.7 12/25/2018 1306   PLT 147 (L) 01/28/2020 0844   PLT 142 (L) 12/25/2018 1306   MCV 96.6 01/28/2020 0844   MCV 92 12/25/2018 1306   MCH 31.6 01/28/2020 0844   MCHC 32.7 01/28/2020 0844   RDW 17.5 (H) 01/28/2020 0844   RDW 14.4 12/25/2018 1306   LYMPHSABS 0.5 (L) 01/28/2020 0844   MONOABS 0.6 01/28/2020 0844   EOSABS 0.3 01/28/2020 0844    BASOSABS 0.0 01/28/2020 0844    BMET    Component Value Date/Time   NA 137 01/28/2020 0844   NA 138 12/25/2018 1306   K 4.1 01/28/2020 0844   CL 106 01/28/2020 0844   CO2 26 01/28/2020 0844   GLUCOSE 105 (H) 01/28/2020 0844   BUN 14 01/28/2020 0844   BUN 15 12/25/2018 1306   CREATININE 0.80 01/28/2020 0844   CREATININE 1.02 05/16/2016 0937   CALCIUM 9.2 01/28/2020 0844   GFRNONAA >60 01/28/2020 0844   GFRAA >60 01/28/2020 0844    BNP    Component Value Date/Time   BNP 43.4 05/16/2016 0937    ProBNP    Component Value Date/Time   PROBNP 60.0 08/10/2014 1052    Specialty Problems      Pulmonary Problems   Pulmonary fibrosis (HCC)   DOE (dyspnea on exertion)      No Known Allergies  Immunization History  Administered Date(s) Administered  . DTaP / IPV 09/25/2012  . Fluad Quad(high Dose 65+) 02/19/2019  . Influenza Split 03/27/2014  . Influenza, High Dose Seasonal PF 03/27/2014, 01/27/2017, 02/22/2018  . Influenza-Unspecified 02/24/2017  . PFIZER SARS-COV-2 Vaccination 07/11/2019, 07/21/2019  . Pneumococcal Conjugate-13 03/27/2014  . Pneumococcal Polysaccharide-23 11/10/2009  . Pneumococcal-Unspecified 03/12/2014  . Tdap 03/24/2011, 09/25/2012  . Zoster 03/21/2010  . Zoster Recombinat (Shingrix) 09/01/2018, 02/19/2019   Received [id number] booster   Past Medical History:  Diagnosis Date  . Adenocarcinoma of cecum (HCC) 06/21/2017  . Arthritis    "mid back; hands; knees" (02/24/2015)  . Basal cell carcinoma    left shoulder; mid chest; right eyelid (02/24/2015)  . CAD (coronary artery disease)    a. 08/2014 Cath/PCI: LM nl, LAD 30p, D1 95 (2.25x12 Resolute Integrity DES), LCX small, RI 100 (attempted PCI) - branches fill via L->L collats, RCA dominant, nl, RPDA/PLA nl, EF 60^. b. 02/24/2015 PCI CTO of Ramus DES x2.  . Chronic bronchitis (HCC)    hx  . Diverticulosis 05/2005  . Elevated lipase   . Fuchs' corneal dystrophy   . History of adenomatous polyp of  colon 05/2005   8 mm adenoma  . History of blood transfusion    "related to some of my surgeries"  . Hyperlipidemia   . Hypertension   . Iron deficiency anemia due to chronic blood loss 06/21/2017  . Prostate cancer (HCC) 2011   S/P seed implant  . Sarcoidosis   . Thrombocytopenia (HCC)    a. Noted on prior labs, unclear of what w/u done.  . Trifascicular block     Tobacco History: Social History   Tobacco Use  Smoking Status Light Tobacco Smoker  . Packs/day: 0.00  . Years: 59.00  . Pack years: 0.00  . Types: Cigars  Smokeless Tobacco Never Used  Tobacco Comment   02/24/2015 "quit cigarettes ~ 2000 ago but smokes cigars ionce/month"   Ready to quit: No Counseling  given: Yes Comment: 02/24/2015 "quit cigarettes ~ 2000 ago but smokes cigars ionce/month"  We recommend that you stop smoking completely  Outpatient Encounter Medications as of 02/04/2020  Medication Sig  . amLODipine (NORVASC) 5 MG tablet TAKE 1 TABLET BY MOUTH TWICE A DAY  . CANNABIDIOL PO Take 12 drops by mouth 2 (two) times a day. CBD OIL  . Cholecalciferol (VITAMIN D3) 2000 units capsule Take 2,000 Units by mouth every evening.   . clopidogrel (PLAVIX) 75 MG tablet TAKE 1 TABLET (75 MG TOTAL) BY MOUTH DAILY WITH BREAKFAST.  . clotrimazole (LOTRIMIN) 1 % cream Apply 1 application topically 3 (three) times daily as needed (skin irritation/rash).  [name] doxycycline (VIBRA-TABS) 100 MG tablet Take 1 tablet (100 mg total) by mouth 2 (two) times daily.  . fluticasone (CUTIVATE) 0.05 % cream Apply topically 2 (two) times daily. Apply to affected areas on face 2 times daily  . magnesium oxide (MAG-OX) 400 MG tablet Take 400 mg by mouth daily.  . metoprolol tartrate (LOPRESSOR) 25 MG tablet TAKE 1/2 TABLET BY MOUTH TWICE A DAY  . nitroGLYCERIN (NITROSTAT) 0.4 MG SL tablet PLACE 1 TABLET UNDER THE TONGUE EVERY 5 MINUTES AS NEEDED FOR CHEST PAIN FOR 3 DOSES  . prochlorperazine (COMPAZINE) 10 MG tablet Take 1 tablet (10 mg  total) by mouth every 6 (six) hours as needed.  . ranolazine (RANEXA) 500 MG 12 hr tablet Take 1 tablet (500 mg total) by mouth 2 (two) times daily.  . rosuvastatin (CRESTOR) 20 MG tablet TAKE 1 TABLET BY MOUTH EVERY DAY  . traMADol (ULTRAM) 50 MG tablet Take 100 mg by mouth 2 (two) times a day.    No facility-administered encounter medications on file as of 02/04/2020.     Review of Systems  Review of Systems  Constitutional: Positive for fatigue. Negative for activity change, chills, fever and unexpected weight change.  HENT: Positive for congestion (yellow / green mucous ) and postnasal drip. Negative for sinus pressure, sinus pain and sore throat.   Eyes: Negative.   Respiratory: Positive for cough, shortness of breath and wheezing.   Cardiovascular: Positive for leg swelling. Negative for chest pain and palpitations.  Gastrointestinal: Negative for constipation, diarrhea, nausea and vomiting.  Endocrine: Negative.   Genitourinary: Negative.   Musculoskeletal: Negative.   Skin: Positive for rash.       Reactions to chemo - on chronic doxy d/t rash   Neurological: Negative for dizziness and headaches.  Psychiatric/Behavioral: Negative.  Negative for dysphoric mood. The patient is not nervous/anxious.   All other systems reviewed and are negative.    Physical Exam  BP 126/72 (BP Location: Left Arm, Cuff Size: Normal)   Pulse 72   Temp 98.4 F (36.9 C) (Oral)   Ht $R'5\' 11"'GQ$  (1.803 m)   Wt 234 lb (106.1 kg)   SpO2 98%   BMI 32.64 kg/m   Wt Readings from Last 5 Encounters:  02/04/20 234 lb (106.1 kg)  01/28/20 231 lb 11.2 oz (105.1 kg)  01/14/20 232 lb 11.2 oz (105.6 kg)  01/01/20 (!) 230 lb 1.6 oz (104.4 kg)  12/17/19 230 lb 6.4 oz (104.5 kg)    BMI Readings from Last 5 Encounters:  02/04/20 32.64 kg/m  01/28/20 32.32 kg/m  01/14/20 32.46 kg/m  01/03/20 32.09 kg/m  01/01/20 32.09 kg/m     Physical Exam Vitals and nursing note reviewed.  Constitutional:       General: He is not in acute distress.    Appearance:  Normal appearance. He is obese.  HENT:     Head: Normocephalic and atraumatic.     Right Ear: Hearing and external ear normal.     Left Ear: Hearing and external ear normal.     Nose: Nose normal. No mucosal edema or rhinorrhea.     Right Turbinates: Not enlarged.     Left Turbinates: Not enlarged.     Mouth/Throat:     Mouth: Mucous membranes are dry.     Pharynx: Oropharynx is clear. No oropharyngeal exudate.  Eyes:     Pupils: Pupils are equal, round, and reactive to light.  Cardiovascular:     Rate and Rhythm: Normal rate and regular rhythm.     Pulses: Normal pulses.     Heart sounds: Normal heart sounds. No murmur heard.   Pulmonary:     Effort: Pulmonary effort is normal.     Breath sounds: Wheezing (RUL wheeze) present. No decreased breath sounds or rales.  Musculoskeletal:     Cervical back: Normal range of motion.     Right lower leg: No edema.     Left lower leg: No edema.  Lymphadenopathy:     Cervical: No cervical adenopathy.  Skin:    General: Skin is warm and dry.     Capillary Refill: Capillary refill takes less than 2 seconds.     Findings: No erythema or rash.  Neurological:     General: No focal deficit present.     Mental Status: He is alert and oriented to person, place, and time.     Motor: No weakness.     Coordination: Coordination normal.     Gait: Gait is intact. Gait normal.  Psychiatric:        Mood and Affect: Mood normal.        Behavior: Behavior normal. Behavior is cooperative.        Thought Content: Thought content normal.        Judgment: Judgment normal.       Assessment & Plan:   Adenocarcinoma of cecum ([place]) Plan: Continue follow-up with oncology  Sarcoidosis Previous history of sarcoidosis Previous management of [name] Noticed increased dyspnea on exertion over the last 3 to 4 months with occasional cough and wheezing Increased allergic rhinitis symptoms, mild  postnasal drip on exam Right upper lobe airway wheeze on clinical exam today Mild progression seen of sarcoid in 2020 CT chest Last pulmonary function testing 2019  Plan: Walk today in office Chest x-ray today We will obtain pulmonary function testing We will have patient establish with new pulmonologist after obtaining pulmonary function testing  DOE (dyspnea on exertion) Patient with worsened dyspnea on exertion Presenting today as a follow-up from last being seen in 2019 Patient with history of sarcoid 2020 CT chest showed mild progression Dyspnea could be him multifactorial given ongoing oncology treatments, fluid retention, sarcoid  Plan: Walk today in office Chest x-ray today We will order pulmonary function testing to further evaluate We will have patient establish with new pulmonologist in our office  Smoker Plan: We recommend that you stop smoking    Return in about 6 weeks (around 03/17/2020), or if symptoms worsen or fail to improve, for Follow up for FULL PFT - 60 min, NEW PULMONOLOGIST IN 55min SLOT.   Lauraine Rinne, NP 02/04/2020   This appointment required 32 minutes of patient care (this includes precharting, chart review, review of results, face-to-face care, etc.).

## 2020-02-04 NOTE — Assessment & Plan Note (Signed)
Previous history of sarcoidosis Previous management of [name] Noticed increased dyspnea on exertion over the last 3 to 4 months with occasional cough and wheezing Increased allergic rhinitis symptoms, mild postnasal drip on exam Right upper lobe airway wheeze on clinical exam today Mild progression seen of sarcoid in 2020 CT chest Last pulmonary function testing 2019  Plan: Walk today in office Chest x-ray today We will obtain pulmonary function testing We will have patient establish with new pulmonologist after obtaining pulmonary function testing

## 2020-02-04 NOTE — Assessment & Plan Note (Signed)
Patient with worsened dyspnea on exertion Presenting today as a follow-up from last being seen in 2019 Patient with history of sarcoid 2020 CT chest showed mild progression Dyspnea could be him multifactorial given ongoing oncology treatments, fluid retention, sarcoid  Plan: Walk today in office Chest x-ray today We will order pulmonary function testing to further evaluate We will have patient establish with new pulmonologist in our office

## 2020-02-05 NOTE — Progress Notes (Signed)
Reviewed and agree with assessment/plan.   Chesley Mires, MD [facility] Pulmonary/Critical Care 02/05/2020, 8:53 AM Pager:  [phone]

## 2020-02-06 ENCOUNTER — Ambulatory Visit
Admission: RE | Admit: 2020-02-06 | Discharge: 2020-02-06 | Disposition: A | Discharge status: Home / Self Care | Payer: Medicare PPO | Source: Ambulatory Visit | Attending: Oncology | Admitting: Oncology

## 2020-02-06 DIAGNOSIS — C180 Malignant neoplasm of cecum: Secondary | ICD-10-CM

## 2020-02-06 DIAGNOSIS — I700 Atherosclerosis of aorta: Secondary | ICD-10-CM | POA: Diagnosis not present

## 2020-02-06 DIAGNOSIS — K5730 Diverticulosis of large intestine without perforation or abscess without bleeding: Secondary | ICD-10-CM | POA: Diagnosis not present

## 2020-02-06 DIAGNOSIS — K449 Diaphragmatic hernia without obstruction or gangrene: Secondary | ICD-10-CM | POA: Diagnosis not present

## 2020-02-06 DIAGNOSIS — K7689 Other specified diseases of liver: Secondary | ICD-10-CM | POA: Diagnosis not present

## 2020-02-06 MED ORDER — IOPAMIDOL (ISOVUE-300) INJECTION 61%
100.0000 mL | Freq: Once | INTRAVENOUS | Status: AC | PRN
  Administered 2020-02-06: 100 mL via INTRAVENOUS

## 2020-02-08 ENCOUNTER — Other Ambulatory Visit: Payer: Self-pay | Admitting: Oncology

## 2020-02-09 DIAGNOSIS — C180 Malignant neoplasm of cecum: Secondary | ICD-10-CM | POA: Diagnosis not present

## 2020-02-10 NOTE — Progress Notes (Signed)
Called and spoke with patient about xray results per [name] NP. All questions answered and patient stated he saw the result on mychart. Patient expressed full understanding.

## 2020-02-11 ENCOUNTER — Other Ambulatory Visit: Payer: Self-pay

## 2020-02-11 ENCOUNTER — Inpatient Hospital Stay: Payer: Medicare PPO

## 2020-02-11 ENCOUNTER — Telehealth: Payer: Self-pay | Admitting: Oncology

## 2020-02-11 ENCOUNTER — Inpatient Hospital Stay: Payer: Medicare PPO | Attending: Nurse Practitioner | Admitting: Oncology

## 2020-02-11 VITALS — BP 138/62 | HR 63 | Temp 97.9°F | Resp 17 | Wt 234.0 lb

## 2020-02-11 DIAGNOSIS — C180 Malignant neoplasm of cecum: Secondary | ICD-10-CM

## 2020-02-11 DIAGNOSIS — Z79899 Other long term (current) drug therapy: Secondary | ICD-10-CM | POA: Insufficient documentation

## 2020-02-11 DIAGNOSIS — C787 Secondary malignant neoplasm of liver and intrahepatic bile duct: Secondary | ICD-10-CM | POA: Diagnosis not present

## 2020-02-11 DIAGNOSIS — Z5111 Encounter for antineoplastic chemotherapy: Secondary | ICD-10-CM | POA: Insufficient documentation

## 2020-02-11 DIAGNOSIS — Z95828 Presence of other vascular implants and grafts: Secondary | ICD-10-CM

## 2020-02-11 DIAGNOSIS — Z5112 Encounter for antineoplastic immunotherapy: Secondary | ICD-10-CM | POA: Insufficient documentation

## 2020-02-11 LAB — CBC WITH DIFFERENTIAL (CANCER CENTER ONLY)
Abs Immature Granulocytes: 0.01 10*3/uL (ref 0.00–0.07)
Basophils Absolute: 0 10*3/uL (ref 0.0–0.1)
Basophils Relative: 1 %
Eosinophils Absolute: 0.2 10*3/uL (ref 0.0–0.5)
Eosinophils Relative: 6 %
HCT: 33.6 % — ABNORMAL LOW (ref 39.0–52.0)
Hemoglobin: 11.1 g/dL — ABNORMAL LOW (ref 13.0–17.0)
Immature Granulocytes: 0 %
Lymphocytes Relative: 13 %
Lymphs Abs: 0.5 10*3/uL — ABNORMAL LOW (ref 0.7–4.0)
MCH: 31.5 pg (ref 26.0–34.0)
MCHC: 33 g/dL (ref 30.0–36.0)
MCV: 95.5 fL (ref 80.0–100.0)
Monocytes Absolute: 0.5 10*3/uL (ref 0.1–1.0)
Monocytes Relative: 14 %
Neutro Abs: 2.5 10*3/uL (ref 1.7–7.7)
Neutrophils Relative %: 66 %
Platelet Count: 146 10*3/uL — ABNORMAL LOW (ref 150–400)
RBC: 3.52 MIL/uL — ABNORMAL LOW (ref 4.22–5.81)
RDW: 17.4 % — ABNORMAL HIGH (ref 11.5–15.5)
WBC Count: 3.7 10*3/uL — ABNORMAL LOW (ref 4.0–10.5)
nRBC: 0 % (ref 0.0–0.2)

## 2020-02-11 LAB — CMP (CANCER CENTER ONLY)
ALT: 28 U/L (ref 0–44)
AST: 36 U/L (ref 15–41)
Albumin: 3.4 g/dL — ABNORMAL LOW (ref 3.5–5.0)
Alkaline Phosphatase: 73 U/L (ref 38–126)
Anion gap: 5 (ref 5–15)
BUN: 12 mg/dL (ref 8–23)
CO2: 28 mmol/L (ref 22–32)
Calcium: 9.6 mg/dL (ref 8.9–10.3)
Chloride: 104 mmol/L (ref 98–111)
Creatinine: 0.79 mg/dL (ref 0.61–1.24)
GFR, Est AFR Am: >60 mL/min (ref >60)
GFR, Estimated: >60 mL/min (ref >60)
Glucose, Bld: 103 mg/dL — ABNORMAL HIGH (ref 70–99)
Potassium: 4.4 mmol/L (ref 3.5–5.1)
Sodium: 137 mmol/L (ref 135–145)
Total Bilirubin: 0.6 mg/dL (ref 0.3–1.2)
Total Protein: 6.3 g/dL — ABNORMAL LOW (ref 6.5–8.1)

## 2020-02-11 LAB — MAGNESIUM: Magnesium: 1.8 mg/dL (ref 1.7–2.4)

## 2020-02-11 MED ORDER — ATROPINE SULFATE 1 MG/ML IJ SOLN
0.5000 mg | Freq: Once | INTRAMUSCULAR | Status: AC | PRN
  Administered 2020-02-11: 0.5 mg via INTRAVENOUS

## 2020-02-11 MED ORDER — ATROPINE SULFATE 1 MG/ML IJ SOLN
INTRAMUSCULAR | Status: AC
  Filled 2020-02-11: qty 1

## 2020-02-11 MED ORDER — SODIUM CHLORIDE 0.9% FLUSH
10.0000 mL | INTRAVENOUS | Status: DC | PRN
  Filled 2020-02-11: qty 10

## 2020-02-11 MED ORDER — SODIUM CHLORIDE 0.9 % IV SOLN
3.0000 mg/kg | Freq: Once | INTRAVENOUS | Status: AC
  Administered 2020-02-11: 300 mg via INTRAVENOUS
  Filled 2020-02-11: qty 15

## 2020-02-11 MED ORDER — SODIUM CHLORIDE 0.9 % IV SOLN
10.0000 mg | Freq: Once | INTRAVENOUS | Status: AC
  Administered 2020-02-11: 10 mg via INTRAVENOUS
  Filled 2020-02-11: qty 10

## 2020-02-11 MED ORDER — SODIUM CHLORIDE 0.9% FLUSH
10.0000 mL | INTRAVENOUS | Status: DC | PRN
  Administered 2020-02-11: 10 mL
  Filled 2020-02-11: qty 10

## 2020-02-11 MED ORDER — SODIUM CHLORIDE 0.9 % IV SOLN
175.0000 mg/m2 | Freq: Once | INTRAVENOUS | Status: AC
  Administered 2020-02-11: 400 mg via INTRAVENOUS
  Filled 2020-02-11: qty 5

## 2020-02-11 MED ORDER — HEPARIN SOD (PORK) LOCK FLUSH 100 UNIT/ML IV SOLN
500.0000 [IU] | Freq: Once | INTRAVENOUS | Status: DC | PRN
  Filled 2020-02-11: qty 5

## 2020-02-11 MED ORDER — PALONOSETRON HCL INJECTION 0.25 MG/5ML
INTRAVENOUS | Status: AC
  Filled 2020-02-11: qty 5

## 2020-02-11 MED ORDER — SODIUM CHLORIDE 0.9 % IV SOLN
Freq: Once | INTRAVENOUS | Status: AC
  Filled 2020-02-11: qty 250

## 2020-02-11 MED ORDER — PALONOSETRON HCL INJECTION 0.25 MG/5ML
0.2500 mg | Freq: Once | INTRAVENOUS | Status: AC
  Administered 2020-02-11: 0.25 mg via INTRAVENOUS

## 2020-02-11 MED ORDER — SODIUM CHLORIDE 0.9 % IV SOLN
2000.0000 mg/m2 | INTRAVENOUS | Status: DC
  Administered 2020-02-11: 4650 mg via INTRAVENOUS
  Filled 2020-02-11: qty 93

## 2020-02-11 MED ORDER — SODIUM CHLORIDE 0.9 % IV SOLN
400.0000 mg/m2 | Freq: Once | INTRAVENOUS | Status: AC
  Administered 2020-02-11: 928 mg via INTRAVENOUS
  Filled 2020-02-11: qty 46.4

## 2020-02-11 NOTE — Telephone Encounter (Signed)
Scheduled per 9/1 los. Pt is aware of appt time and date.

## 2020-02-11 NOTE — Progress Notes (Signed)
[facility] OFFICE PROGRESS NOTE   Diagnosis: Colon cancer  INTERVAL HISTORY:   Luis [name] returns as scheduled.  He completed another cycle of FOLFIRI/Panitumumab on 01/28/2020.  The skin rash is partially improved.  He has developed linear cracks at the right sole.  This is healing.  No other complaint.  He is scheduled for pulmonary function studies to evaluate the exertional dyspnea.  Objective:  Vital signs in last 24 hours:  There were no vitals taken for this visit.    HEENT: No thrush or ulcers Resp: Lungs clear bilaterally Cardio: Regular rate and rhythm GI: No hepatosplenomegaly, nontender Vascular: No leg edema  Skin: Acne type rash over the trunk, few areas of paronychia at the hands, mild erythema, superficial desquamation, and superficial linear ulcers at the bilateral soles  Portacath/PICC-without erythema  Lab Results:  Lab Results  Component Value Date   WBC 3.7 (L) 02/11/2020   HGB 11.1 (L) 02/11/2020   HCT 33.6 (L) 02/11/2020   MCV 95.5 02/11/2020   PLT 146 (L) 02/11/2020   NEUTROABS 2.5 02/11/2020    CMP  Lab Results  Component Value Date   NA 137 02/11/2020   K 4.4 02/11/2020   CL 104 02/11/2020   CO2 28 02/11/2020   GLUCOSE 103 (H) 02/11/2020   BUN 12 02/11/2020   CREATININE 0.79 02/11/2020   CALCIUM 9.6 02/11/2020   PROT 6.3 (L) 02/11/2020   ALBUMIN 3.4 (L) 02/11/2020   AST 36 02/11/2020   ALT 28 02/11/2020   ALKPHOS 73 02/11/2020   BILITOT 0.6 02/11/2020   GFRNONAA >60 02/11/2020   GFRAA >60 02/11/2020    Lab Results  Component Value Date   CEA1 2.26 01/28/2020    Medications: I have reviewed the patient's current medications.   Assessment/Plan: 1. Adenocarcinoma of the cecum, stage IIA(T3N0), status post a right colectomy 07/26/2017 ? 0/16 lymph nodes positive, no lymphovascular invasion, perineural invasion present ? MSI-stable, no loss of mismatch repair protein expression ? Surveillance colonoscopy  07/26/2018-no polyps found ? Elevated CEA June 2020, persistently elevated August 2020 ? CTs 02/07/2019-bilateral liver metastases, no extrahepatic metastatic disease, changes of sarcoidosis in the chest ? Biopsy liver lesion 03/04/2019-metastatic adenocarcinoma, with morphology consistent with metastatic colonic adenocarcinoma, RAS WT ? Cycle 1 FOLFOX 03/12/2019 ? Cycle 2 FOLFOX 03/26/2019, oxaliplatin dose reduced secondary to thrombocytopenia ? Cycle 3 FOLFOX 04/09/2019, oxaliplatin further dose reduced and aspirin held secondary to thrombocytopenia ? Cycle 4 FOLFOX 04/23/2019 ? Cycle 5 FOLFOX 05/07/2019 ? CTs 05/23/2019-decrease in liver lesions. No new or progressive findings. ? Cycle 6 FOLFOX 05/27/2019 ? Cycle 7 FOLFOX 06/14/2019 ? Cycle 8 FOLFOX 07/02/2019-Udenyca held ? Cycle 9 FOLFOX 07/16/2019-Udenyca held ? Cycle 10 FOLFOX 07/30/2019-Udenyca held ? CT abdomen/pelvis 08/13/2019-mild improvement in previously noted liver lesions ? MRI abdomen 08/13/2019-3 liver lesions ? Cycle 1 FOLFIRI/Avastin 09/17/2019 ? Cycle 2 FOLFIRI/Avastin 10/01/2019 ? Cycle 3 FOLFIRI/Avastin 10/15/2019 ? Cycle 4 FOLFIRI/Avastin 11/05/2019 ? Cycle 5 FOLFIRI/Avastin 11/19/2019 ? CTs 11/27/2019-stable liver lesions; no new liver lesions. No other evidence of metastatic disease in the abdomen or pelvis. ? Cycle 1 FOLFIRI/Panitumumab 12/03/2019 ? Cycle 2 FOLFIRI/Panitumumab 12/17/2019 (Panitumumab dose reduced due to rash) ? Cycle 3 FOLFIRI/Panitumumab 01/01/2020 ? Cycle 4 FOLFIRI/Panitumumab 01/14/2020 ? Cycle 5 FOLFIRI/Panitumumab 01/28/2020 ? CT abdomen/pelvis 02/06/2020-decreased size of right hepatic lesions, subtle lesion in the lateral left hepatic lobe-not clearly identified, new evidence of disease progression ? Cycle 6 FOLFIRI/Panitumumab 02/11/2020 2. History of colon polyps 3. Microcytic anemia January 2019 4. Sarcoidosis  5. Coronary artery disease 6. Port-A-Cath placement, interventional radiology,  03/04/2019 7. Thrombocytopenia, mild thrombocytopenia predating chemotherapy 8. Oxaliplatin neuropathy     Disposition: Luis [name] appears stable.  He has completed 5 cycles of FOLFIRI/Panitumumab.  The restaging CT confirms improvement in the liver metastases.  I reviewed the CT images with Luis [name].  I recommend continuing FOLFIRI/Panitumumab he agrees.  I will present his case at the GI tumor conference.  He may be a candidate for hepatic ablation or SBRT if the liver lesions continue to decrease in size.  We will plan for a restaging CT or MRI after cycle 8.  He will moisturizers and as needed fluticasone for the skin rash.  He will follow up with pulmonary medicine to evaluate the exertional dyspnea.    Betsy Coder, MD  02/11/2020  11:46 AM

## 2020-02-11 NOTE — Patient Instructions (Signed)
[facility] Discharge Instructions for Patients Receiving Chemotherapy  Today you received the following chemotherapy agents: Irinotecan, Leucovorin, Fluorouracil, and Vectibix  To help prevent nausea and vomiting after your treatment, we encourage you to take your nausea medication as prescribed.    If you develop nausea and vomiting that is not controlled by your nausea medication, call the clinic.   BELOW ARE SYMPTOMS THAT SHOULD BE REPORTED IMMEDIATELY:  *FEVER GREATER THAN 100.5 F  *CHILLS WITH OR WITHOUT FEVER  NAUSEA AND VOMITING THAT IS NOT CONTROLLED WITH YOUR NAUSEA MEDICATION  *UNUSUAL SHORTNESS OF BREATH  *UNUSUAL BRUISING OR BLEEDING  TENDERNESS IN MOUTH AND THROAT WITH OR WITHOUT PRESENCE OF ULCERS  *URINARY PROBLEMS  *BOWEL PROBLEMS  UNUSUAL RASH Items with * indicate a potential emergency and should be followed up as soon as possible.  Feel free to call the clinic should you have any questions or concerns. The clinic phone number is [phone].  Please show the [facility] at check-in to the Emergency Department and triage nurse.

## 2020-02-13 ENCOUNTER — Other Ambulatory Visit: Payer: Self-pay

## 2020-02-13 ENCOUNTER — Inpatient Hospital Stay: Payer: Medicare PPO

## 2020-02-13 VITALS — BP 148/68 | HR 71 | Temp 97.8°F | Resp 18

## 2020-02-13 DIAGNOSIS — Z79899 Other long term (current) drug therapy: Secondary | ICD-10-CM | POA: Diagnosis not present

## 2020-02-13 DIAGNOSIS — C180 Malignant neoplasm of cecum: Secondary | ICD-10-CM | POA: Diagnosis not present

## 2020-02-13 DIAGNOSIS — Z5112 Encounter for antineoplastic immunotherapy: Secondary | ICD-10-CM | POA: Diagnosis not present

## 2020-02-13 DIAGNOSIS — C787 Secondary malignant neoplasm of liver and intrahepatic bile duct: Secondary | ICD-10-CM | POA: Diagnosis not present

## 2020-02-13 DIAGNOSIS — Z5111 Encounter for antineoplastic chemotherapy: Secondary | ICD-10-CM | POA: Diagnosis not present

## 2020-02-13 MED ORDER — HEPARIN SOD (PORK) LOCK FLUSH 100 UNIT/ML IV SOLN
500.0000 [IU] | Freq: Once | INTRAVENOUS | Status: AC | PRN
  Administered 2020-02-13: 500 [IU]
  Filled 2020-02-13: qty 5

## 2020-02-13 MED ORDER — SODIUM CHLORIDE 0.9% FLUSH
10.0000 mL | INTRAVENOUS | Status: DC | PRN
  Administered 2020-02-13: 10 mL
  Filled 2020-02-13: qty 10

## 2020-02-20 DIAGNOSIS — M48061 Spinal stenosis, lumbar region without neurogenic claudication: Secondary | ICD-10-CM | POA: Diagnosis not present

## 2020-02-20 DIAGNOSIS — M545 Low back pain: Secondary | ICD-10-CM | POA: Diagnosis not present

## 2020-02-22 ENCOUNTER — Other Ambulatory Visit: Payer: Self-pay | Admitting: Oncology

## 2020-02-24 MED FILL — Dexamethasone Sodium Phosphate Inj 100 MG/10ML: INTRAMUSCULAR | Qty: 1 | Status: AC

## 2020-02-25 ENCOUNTER — Inpatient Hospital Stay: Payer: Medicare PPO

## 2020-02-25 ENCOUNTER — Other Ambulatory Visit: Payer: Self-pay

## 2020-02-25 ENCOUNTER — Ambulatory Visit (HOSPITAL_COMMUNITY)
Admission: RE | Admit: 2020-02-25 | Discharge: 2020-02-25 | Disposition: A | Discharge status: Home / Self Care | Payer: Medicare PPO | Source: Ambulatory Visit | Attending: Nurse Practitioner | Admitting: Nurse Practitioner

## 2020-02-25 ENCOUNTER — Inpatient Hospital Stay: Payer: Medicare PPO | Admitting: Nurse Practitioner

## 2020-02-25 ENCOUNTER — Encounter: Payer: Self-pay | Admitting: Nurse Practitioner

## 2020-02-25 VITALS — BP 129/64 | HR 68 | Temp 98.1°F | Resp 18 | Ht 71.0 in | Wt 234.2 lb

## 2020-02-25 DIAGNOSIS — Z5111 Encounter for antineoplastic chemotherapy: Secondary | ICD-10-CM | POA: Diagnosis not present

## 2020-02-25 DIAGNOSIS — C180 Malignant neoplasm of cecum: Secondary | ICD-10-CM

## 2020-02-25 DIAGNOSIS — R0602 Shortness of breath: Secondary | ICD-10-CM | POA: Diagnosis not present

## 2020-02-25 DIAGNOSIS — Z5112 Encounter for antineoplastic immunotherapy: Secondary | ICD-10-CM | POA: Diagnosis not present

## 2020-02-25 DIAGNOSIS — C787 Secondary malignant neoplasm of liver and intrahepatic bile duct: Secondary | ICD-10-CM | POA: Diagnosis not present

## 2020-02-25 DIAGNOSIS — Z79899 Other long term (current) drug therapy: Secondary | ICD-10-CM | POA: Diagnosis not present

## 2020-02-25 LAB — CMP (CANCER CENTER ONLY)
ALT: 39 U/L (ref 0–44)
AST: 36 U/L (ref 15–41)
Albumin: 3.4 g/dL — ABNORMAL LOW (ref 3.5–5.0)
Alkaline Phosphatase: 78 U/L (ref 38–126)
Anion gap: 4 — ABNORMAL LOW (ref 5–15)
BUN: 17 mg/dL (ref 8–23)
CO2: 26 mmol/L (ref 22–32)
Calcium: 8.6 mg/dL — ABNORMAL LOW (ref 8.9–10.3)
Chloride: 106 mmol/L (ref 98–111)
Creatinine: 0.78 mg/dL (ref 0.61–1.24)
GFR, Est AFR Am: >60 mL/min (ref >60)
GFR, Estimated: >60 mL/min (ref >60)
Glucose, Bld: 103 mg/dL — ABNORMAL HIGH (ref 70–99)
Potassium: 4.2 mmol/L (ref 3.5–5.1)
Sodium: 136 mmol/L (ref 135–145)
Total Bilirubin: 0.7 mg/dL (ref 0.3–1.2)
Total Protein: 6 g/dL — ABNORMAL LOW (ref 6.5–8.1)

## 2020-02-25 LAB — CBC WITH DIFFERENTIAL (CANCER CENTER ONLY)
Abs Immature Granulocytes: 0.03 10*3/uL (ref 0.00–0.07)
Basophils Absolute: 0 10*3/uL (ref 0.0–0.1)
Basophils Relative: 0 %
Eosinophils Absolute: 0.3 10*3/uL (ref 0.0–0.5)
Eosinophils Relative: 6 %
HCT: 34.3 % — ABNORMAL LOW (ref 39.0–52.0)
Hemoglobin: 11.3 g/dL — ABNORMAL LOW (ref 13.0–17.0)
Immature Granulocytes: 1 %
Lymphocytes Relative: 11 %
Lymphs Abs: 0.5 10*3/uL — ABNORMAL LOW (ref 0.7–4.0)
MCH: 31.5 pg (ref 26.0–34.0)
MCHC: 32.9 g/dL (ref 30.0–36.0)
MCV: 95.5 fL (ref 80.0–100.0)
Monocytes Absolute: 0.7 10*3/uL (ref 0.1–1.0)
Monocytes Relative: 15 %
Neutro Abs: 3.3 10*3/uL (ref 1.7–7.7)
Neutrophils Relative %: 67 %
Platelet Count: 142 10*3/uL — ABNORMAL LOW (ref 150–400)
RBC: 3.59 MIL/uL — ABNORMAL LOW (ref 4.22–5.81)
RDW: 17.8 % — ABNORMAL HIGH (ref 11.5–15.5)
WBC Count: 4.8 10*3/uL (ref 4.0–10.5)
nRBC: 0 % (ref 0.0–0.2)

## 2020-02-25 LAB — MAGNESIUM: Magnesium: 1.7 mg/dL (ref 1.7–2.4)

## 2020-02-25 MED ORDER — IOHEXOL 350 MG/ML SOLN
100.0000 mL | Freq: Once | INTRAVENOUS | Status: AC | PRN
  Administered 2020-02-25: 100 mL via INTRAVENOUS

## 2020-02-25 MED ORDER — HEPARIN SOD (PORK) LOCK FLUSH 100 UNIT/ML IV SOLN: 500.0000 [IU] | Freq: Once | INTRAVENOUS | Status: AC

## 2020-02-25 MED ORDER — HEPARIN SOD (PORK) LOCK FLUSH 100 UNIT/ML IV SOLN
INTRAVENOUS | Status: AC
  Administered 2020-02-25: 500 [IU] via INTRAVENOUS
  Filled 2020-02-25: qty 5

## 2020-02-25 NOTE — Progress Notes (Addendum)
[place] OFFICE PROGRESS NOTE   Diagnosis: Colon cancer  INTERVAL HISTORY:   Luis [name] returns as scheduled.  He completed cycle 6 FOLFIRI/Panitumumab 02/11/2020.  No significant nausea/vomiting after chemotherapy.  He had a single mouth sore which has resolved.  No diarrhea.  He feels the skin rash is better.  He continues doxycycline.  He reports improvement in back pain since an injection.  He notes increased dyspnea with less exertion.  He has an occasional cough.  He wheezes in the morning hours.  The wheezing resolves when he expectorates mucus.  No fever.  He wonders if the dyspnea is related to sarcoid.  He reports being scheduled for PFTs 03/10/2020.  He denies chest pain.  He notes bilateral leg swelling.  No calf pain.  He quit smoking cigarettes 20 years ago.  He has an occasional cigar.  Objective:  Vital signs in last 24 hours:  Blood pressure 129/64, pulse 68, temperature 98.1 F (36.7 C), temperature source Tympanic, resp. rate 18, height 5' 11" (1.803 m), weight 234 lb 3.2 oz (106.2 kg), SpO2 100 %.    HEENT: No thrush or ulcers. Resp: Distant breath sounds, occasional rhonchi.  No respiratory distress. Cardio: Regular rate and rhythm. GI: Abdomen soft and nontender.  No hepatomegaly. Vascular: Pitting edema at the lower legs/ankles. Neuro: Alert and oriented. Skin: Acne type rash over the trunk. Port-A-Cath without erythema.  Lab Results:  Lab Results  Component Value Date   WBC 4.8 02/25/2020   HGB 11.3 (L) 02/25/2020   HCT 34.3 (L) 02/25/2020   MCV 95.5 02/25/2020   PLT 142 (L) 02/25/2020   NEUTROABS 3.3 02/25/2020    Imaging:  No results found.  Medications: I have reviewed the patient's current medications.  Assessment/Plan: 1. Adenocarcinoma of the cecum, stage IIA(T3N0), status post a right colectomy 07/26/2017 ? 0/16 lymph nodes positive, no lymphovascular invasion, perineural invasion present ? MSI-stable, no loss of mismatch  repair protein expression ? Surveillance colonoscopy 07/26/2018-no polyps found ? Elevated CEA June 2020, persistently elevated August 2020 ? CTs 02/07/2019-bilateral liver metastases, no extrahepatic metastatic disease, changes of sarcoidosis in the chest ? Biopsy liver lesion 03/04/2019-metastatic adenocarcinoma, with morphology consistent with metastatic colonic adenocarcinoma, RAS WT ? Cycle 1 FOLFOX 03/12/2019 ? Cycle 2 FOLFOX 03/26/2019, oxaliplatin dose reduced secondary to thrombocytopenia ? Cycle 3 FOLFOX 04/09/2019, oxaliplatin further dose reduced and aspirin held secondary to thrombocytopenia ? Cycle 4 FOLFOX 04/23/2019 ? Cycle 5 FOLFOX 05/07/2019 ? CTs 05/23/2019-decrease in liver lesions. No new or progressive findings. ? Cycle 6 FOLFOX 05/27/2019 ? Cycle 7 FOLFOX 06/14/2019 ? Cycle 8 FOLFOX 07/02/2019-Udenyca held ? Cycle 9 FOLFOX 07/16/2019-Udenyca held ? Cycle 10 FOLFOX 07/30/2019-Udenyca held ? CT abdomen/pelvis 08/13/2019-mild improvement in previously noted liver lesions ? MRI abdomen 08/13/2019-3 liver lesions ? Cycle 1 FOLFIRI/Avastin 09/17/2019 ? Cycle 2 FOLFIRI/Avastin 10/01/2019 ? Cycle 3 FOLFIRI/Avastin 10/15/2019 ? Cycle 4 FOLFIRI/Avastin 11/05/2019 ? Cycle 5 FOLFIRI/Avastin 11/19/2019 ? CTs 11/27/2019-stable liver lesions; no new liver lesions. No other evidence of metastatic disease in the abdomen or pelvis. ? Cycle 1 FOLFIRI/Panitumumab 12/03/2019 ? Cycle 2 FOLFIRI/Panitumumab 12/17/2019 (Panitumumab dose reduced due to rash) ? Cycle 3 FOLFIRI/Panitumumab 01/01/2020 ? Cycle 4 FOLFIRI/Panitumumab 01/14/2020 ? Cycle 5 FOLFIRI/Panitumumab 01/28/2020 ? CT abdomen/pelvis 02/06/2020-decreased size of right hepatic lesions, subtle lesion in the lateral left hepatic lobe-not clearly identified, new evidence of disease progression ? Cycle 6 FOLFIRI/Panitumumab 02/11/2020 2. History of colon polyps 3. Microcytic anemia January 2019 4. Sarcoidosis 5. Coronary artery disease  6. Port-A-Cath  placement, interventional radiology, 03/04/2019 7. Thrombocytopenia, mild thrombocytopenia predating chemotherapy 8. Oxaliplatin neuropathy    Disposition: Luis [name] appears stable.  He has completed 6 cycles of FOLFIRI/Panitumumab.  We are holding today's treatment.  He has progressive dyspnea on exertion.  We are referring him for a stat chest CT rule out PE, interstitial lung disease.  He is scheduled for PFTs in 2 weeks.  We will try to arrange for PFTs this week.  Additional follow-up will be scheduled pending today's CT results.  Patient seen with Dr. Benay Spice.  Ned Card ANP/GNP-BC   02/25/2020  10:32 AM  Addendum 4:45 PM-CT negative for PE, areas of scarring and granulomatous disease noted.  This was a shared visit with Ned Card.  Luis [name] continues treatment with FOLFIRI/Panitumumab.  He has developed progressive exertional dyspnea.  We referred him for a CT evaluation to look for evidence of pulmonary embolism, infection, or pneumonitis.  There are no acute changes on the CT.  He has been scheduled for pulmonary function studies.  I suspect the dyspnea is related to chronic lung disease including sarcoidosis   The plan is to continue FOLFIRI/Panitumumab.  He will be referred for repeat pulmonary evaluation.  Julieanne Manson, MD

## 2020-02-26 ENCOUNTER — Telehealth: Payer: Self-pay | Admitting: Nurse Practitioner

## 2020-02-26 ENCOUNTER — Telehealth: Payer: Self-pay | Admitting: *Deleted

## 2020-02-26 NOTE — Telephone Encounter (Signed)
Infusion and pump appointments cancelled per 9/15 los. No new appointments scheduled per 9/15 los.

## 2020-02-26 NOTE — Telephone Encounter (Signed)
Patient left VM asking for CT results to be explained and what is next step and when? MD notified of request.

## 2020-02-26 NOTE — Telephone Encounter (Signed)
Per Dr. Benay Spice: CT shows no PE, cancer or pneumonitis related to his cancer. Next step is to get the PFT's done as soon as we are able that were ordered on 02/04/20 by pulmonary. Offer his chemo next week or 9/29 as scheduled. Left VM for patient with this information. Sent email to Andre Lefort at [facility] to request assistance in getting the PFTs done as soon as possible.

## 2020-02-27 ENCOUNTER — Telehealth: Payer: Self-pay | Admitting: *Deleted

## 2020-02-27 ENCOUNTER — Inpatient Hospital Stay: Payer: Medicare PPO

## 2020-02-27 ENCOUNTER — Telehealth: Payer: Self-pay

## 2020-02-27 NOTE — Telephone Encounter (Signed)
Patient left VM that PFT's were scheduled for 03/10/20 after his chemo is completed. He would like to keep next tx at 03/10/20 and asking if OK to schedule the next two treatments in 3 week intervals? This was OK per Dr. Benay Spice. Also sent copy of CT scan and staff message to [name], [place] w/pulmonary requesting he be seen week of 9/20--may need inhaler.

## 2020-02-27 NOTE — Telephone Encounter (Signed)
Thank you   Luis [name]  

## 2020-03-01 ENCOUNTER — Telehealth: Payer: Self-pay | Admitting: *Deleted

## 2020-03-01 NOTE — Telephone Encounter (Signed)
Over past week has had increasing trouble sleeping. Sometimes, can't get to sleep and others, can't stay asleep. Asking if he can try the OTC ZzzQuil Nighttime Sleep Aid? OK per Dr. Benay Spice.

## 2020-03-07 ENCOUNTER — Other Ambulatory Visit: Payer: Self-pay | Admitting: Oncology

## 2020-03-09 ENCOUNTER — Other Ambulatory Visit: Payer: Self-pay | Admitting: Nurse Practitioner

## 2020-03-09 DIAGNOSIS — C180 Malignant neoplasm of cecum: Secondary | ICD-10-CM

## 2020-03-10 ENCOUNTER — Inpatient Hospital Stay: Payer: Medicare PPO

## 2020-03-10 ENCOUNTER — Other Ambulatory Visit: Payer: Self-pay

## 2020-03-10 ENCOUNTER — Ambulatory Visit (INDEPENDENT_AMBULATORY_CARE_PROVIDER_SITE_OTHER): Payer: Medicare PPO | Admitting: Pulmonary Disease

## 2020-03-10 ENCOUNTER — Encounter: Payer: Self-pay | Admitting: Nurse Practitioner

## 2020-03-10 ENCOUNTER — Inpatient Hospital Stay: Payer: Medicare PPO | Admitting: Nurse Practitioner

## 2020-03-10 VITALS — BP 127/67 | HR 68 | Temp 98.0°F | Resp 18 | Ht 71.0 in | Wt 237.0 lb

## 2020-03-10 DIAGNOSIS — C180 Malignant neoplasm of cecum: Secondary | ICD-10-CM

## 2020-03-10 DIAGNOSIS — D869 Sarcoidosis, unspecified: Secondary | ICD-10-CM

## 2020-03-10 DIAGNOSIS — Z79899 Other long term (current) drug therapy: Secondary | ICD-10-CM | POA: Diagnosis not present

## 2020-03-10 DIAGNOSIS — C787 Secondary malignant neoplasm of liver and intrahepatic bile duct: Secondary | ICD-10-CM | POA: Diagnosis not present

## 2020-03-10 DIAGNOSIS — Z5112 Encounter for antineoplastic immunotherapy: Secondary | ICD-10-CM | POA: Diagnosis not present

## 2020-03-10 DIAGNOSIS — Z5111 Encounter for antineoplastic chemotherapy: Secondary | ICD-10-CM | POA: Diagnosis not present

## 2020-03-10 DIAGNOSIS — Z95828 Presence of other vascular implants and grafts: Secondary | ICD-10-CM

## 2020-03-10 DIAGNOSIS — R0609 Other forms of dyspnea: Secondary | ICD-10-CM

## 2020-03-10 LAB — PULMONARY FUNCTION TEST
DL/VA % pred: 118 %
DL/VA: 4.63 ml/min/mmHg/L
DLCO cor % pred: 84 %
DLCO cor: 21.4 ml/min/mmHg
DLCO unc % pred: 75 %
DLCO unc: 19.1 ml/min/mmHg
FEF 25-75 Post: 1.06 L/sec
FEF 25-75 Pre: 1.05 L/sec
FEF2575-%Change-Post: 0 %
FEF2575-%Pred-Post: 48 %
FEF2575-%Pred-Pre: 48 %
FEV1-%Change-Post: 1 %
FEV1-%Pred-Post: 63 %
FEV1-%Pred-Pre: 62 %
FEV1-Post: 1.96 L
FEV1-Pre: 1.93 L
FEV1FVC-%Change-Post: 3 %
FEV1FVC-%Pred-Pre: 91 %
FEV6-%Change-Post: -1 %
FEV6-%Pred-Post: 70 %
FEV6-%Pred-Pre: 71 %
FEV6-Post: 2.85 L
FEV6-Pre: 2.88 L
FEV6FVC-%Change-Post: 0 %
FEV6FVC-%Pred-Post: 104 %
FEV6FVC-%Pred-Pre: 104 %
FVC-%Change-Post: -1 %
FVC-%Pred-Post: 67 %
FVC-%Pred-Pre: 68 %
FVC-Post: 2.9 L
FVC-Pre: 2.94 L
Post FEV1/FVC ratio: 68 %
Post FEV6/FVC ratio: 98 %
Pre FEV1/FVC ratio: 66 %
Pre FEV6/FVC Ratio: 98 %
RV % pred: 76 %
RV: 2.04 L
TLC % pred: 69 %
TLC: 5.02 L

## 2020-03-10 LAB — CBC WITH DIFFERENTIAL (CANCER CENTER ONLY)
Abs Immature Granulocytes: 0.02 10*3/uL (ref 0.00–0.07)
Basophils Absolute: 0 10*3/uL (ref 0.0–0.1)
Basophils Relative: 1 %
Eosinophils Absolute: 0.3 10*3/uL (ref 0.0–0.5)
Eosinophils Relative: 7 %
HCT: 34.9 % — ABNORMAL LOW (ref 39.0–52.0)
Hemoglobin: 11.4 g/dL — ABNORMAL LOW (ref 13.0–17.0)
Immature Granulocytes: 0 %
Lymphocytes Relative: 10 %
Lymphs Abs: 0.5 10*3/uL — ABNORMAL LOW (ref 0.7–4.0)
MCH: 31 pg (ref 26.0–34.0)
MCHC: 32.7 g/dL (ref 30.0–36.0)
MCV: 94.8 fL (ref 80.0–100.0)
Monocytes Absolute: 0.8 10*3/uL (ref 0.1–1.0)
Monocytes Relative: 15 %
Neutro Abs: 3.4 10*3/uL (ref 1.7–7.7)
Neutrophils Relative %: 67 %
Platelet Count: 145 10*3/uL — ABNORMAL LOW (ref 150–400)
RBC: 3.68 MIL/uL — ABNORMAL LOW (ref 4.22–5.81)
RDW: 17.1 % — ABNORMAL HIGH (ref 11.5–15.5)
WBC Count: 5 10*3/uL (ref 4.0–10.5)
nRBC: 0 % (ref 0.0–0.2)

## 2020-03-10 LAB — CMP (CANCER CENTER ONLY)
ALT: 44 U/L (ref 0–44)
AST: 50 U/L — ABNORMAL HIGH (ref 15–41)
Albumin: 3.5 g/dL (ref 3.5–5.0)
Alkaline Phosphatase: 78 U/L (ref 38–126)
Anion gap: 7 (ref 5–15)
BUN: 13 mg/dL (ref 8–23)
CO2: 27 mmol/L (ref 22–32)
Calcium: 8.9 mg/dL (ref 8.9–10.3)
Chloride: 106 mmol/L (ref 98–111)
Creatinine: 0.84 mg/dL (ref 0.61–1.24)
GFR, Est AFR Am: >60 mL/min (ref >60)
GFR, Estimated: >60 mL/min (ref >60)
Glucose, Bld: 91 mg/dL (ref 70–99)
Potassium: 4.2 mmol/L (ref 3.5–5.1)
Sodium: 140 mmol/L (ref 135–145)
Total Bilirubin: 0.5 mg/dL (ref 0.3–1.2)
Total Protein: 6.4 g/dL — ABNORMAL LOW (ref 6.5–8.1)

## 2020-03-10 LAB — CEA (IN HOUSE-CHCC): CEA (CHCC-In House): 2.34 ng/mL (ref 0.00–5.00)

## 2020-03-10 LAB — MAGNESIUM: Magnesium: 1.9 mg/dL (ref 1.7–2.4)

## 2020-03-10 MED ORDER — SODIUM CHLORIDE 0.9 % IV SOLN
3.0000 mg/kg | Freq: Once | INTRAVENOUS | Status: AC
  Administered 2020-03-10: 300 mg via INTRAVENOUS
  Filled 2020-03-10: qty 15

## 2020-03-10 MED ORDER — SODIUM CHLORIDE 0.9 % IV SOLN
Freq: Once | INTRAVENOUS | Status: AC
  Filled 2020-03-10: qty 250

## 2020-03-10 MED ORDER — SODIUM CHLORIDE 0.9 % IV SOLN
400.0000 mg/m2 | Freq: Once | INTRAVENOUS | Status: AC
  Administered 2020-03-10: 928 mg via INTRAVENOUS
  Filled 2020-03-10: qty 46.4

## 2020-03-10 MED ORDER — PALONOSETRON HCL INJECTION 0.25 MG/5ML
INTRAVENOUS | Status: AC
  Filled 2020-03-10: qty 5

## 2020-03-10 MED ORDER — SODIUM CHLORIDE 0.9% FLUSH
10.0000 mL | INTRAVENOUS | Status: DC | PRN
  Administered 2020-03-10: 10 mL
  Filled 2020-03-10: qty 10

## 2020-03-10 MED ORDER — PALONOSETRON HCL INJECTION 0.25 MG/5ML
0.2500 mg | Freq: Once | INTRAVENOUS | Status: AC
  Administered 2020-03-10: 0.25 mg via INTRAVENOUS

## 2020-03-10 MED ORDER — SODIUM CHLORIDE 0.9 % IV SOLN
10.0000 mg | Freq: Once | INTRAVENOUS | Status: AC
  Administered 2020-03-10: 10 mg via INTRAVENOUS
  Filled 2020-03-10: qty 10

## 2020-03-10 MED ORDER — ATROPINE SULFATE 1 MG/ML IJ SOLN
INTRAMUSCULAR | Status: AC
  Filled 2020-03-10: qty 1

## 2020-03-10 MED ORDER — LORAZEPAM 0.5 MG PO TABS: 0.5000 mg | ORAL_TABLET | Freq: Every evening | ORAL | 0 refills | Status: DC | PRN

## 2020-03-10 MED ORDER — SODIUM CHLORIDE 0.9 % IV SOLN
2000.0000 mg/m2 | INTRAVENOUS | Status: DC
  Administered 2020-03-10: 4650 mg via INTRAVENOUS
  Filled 2020-03-10: qty 93

## 2020-03-10 MED ORDER — SODIUM CHLORIDE 0.9 % IV SOLN
175.0000 mg/m2 | Freq: Once | INTRAVENOUS | Status: AC
  Administered 2020-03-10: 400 mg via INTRAVENOUS
  Filled 2020-03-10: qty 15

## 2020-03-10 MED ORDER — ATROPINE SULFATE 1 MG/ML IJ SOLN
0.5000 mg | Freq: Once | INTRAMUSCULAR | Status: AC | PRN
  Administered 2020-03-10: 0.5 mg via INTRAVENOUS

## 2020-03-10 NOTE — Progress Notes (Signed)
PFT done today. 

## 2020-03-10 NOTE — Patient Instructions (Signed)
[facility] Discharge Instructions for Patients Receiving Chemotherapy  Today you received the following chemotherapy agents: Irinotecan, Leucovorin, Fluorouracil, and Vectibix  To help prevent nausea and vomiting after your treatment, we encourage you to take your nausea medication as prescribed.    If you develop nausea and vomiting that is not controlled by your nausea medication, call the clinic.   BELOW ARE SYMPTOMS THAT SHOULD BE REPORTED IMMEDIATELY:  *FEVER GREATER THAN 100.5 F  *CHILLS WITH OR WITHOUT FEVER  NAUSEA AND VOMITING THAT IS NOT CONTROLLED WITH YOUR NAUSEA MEDICATION  *UNUSUAL SHORTNESS OF BREATH  *UNUSUAL BRUISING OR BLEEDING  TENDERNESS IN MOUTH AND THROAT WITH OR WITHOUT PRESENCE OF ULCERS  *URINARY PROBLEMS  *BOWEL PROBLEMS  UNUSUAL RASH Items with * indicate a potential emergency and should be followed up as soon as possible.  Feel free to call the clinic should you have any questions or concerns. The clinic phone number is [phone].  Please show the [facility] at check-in to the Emergency Department and triage nurse.

## 2020-03-10 NOTE — Progress Notes (Signed)
[facility] [place] OFFICE PROGRESS NOTE   Diagnosis: Colon cancer  INTERVAL HISTORY:   [name] returns as scheduled.  He completed cycle 6 FOLFIRI/Panitumumab 02/11/2020.  Cycle 7 was held on 02/25/2020 due to progressive dyspnea on exertion.  He was referred for a chest CT which showed no evidence of PE, infection or pneumonitis.  He reports his breathing is stable.  He continues to have intermittent dyspnea on exertion.  He typically notes wheezing in the morning hours and at bedtime.  No nausea or vomiting.  No diarrhea.  He has a single mouth sore which is resolving.  His main complaint today is difficulty sleeping.  He tried Benadryl which did not help.  Objective:  Vital signs in last 24 hours:  Blood pressure 127/67, pulse 68, temperature 98 F (36.7 C), temperature source Tympanic, resp. rate 18, height 5' 11" (1.803 m), weight 237 lb (107.5 kg), SpO2 100 %.    HEENT: No thrush or ulcers. Resp: Distant breath sounds.  No respiratory distress.  No wheezes. Cardio: Regular rate and rhythm. GI: Abdomen soft and nontender.  No hepatomegaly. Vascular: Trace pitting edema at the lower legs/ankles bilaterally.  Skin: Acne type rash over the trunk. Port-A-Cath without erythema.   Lab Results:  Lab Results  Component Value Date   WBC 5.0 03/10/2020   HGB 11.4 (L) 03/10/2020   HCT 34.9 (L) 03/10/2020   MCV 94.8 03/10/2020   PLT 145 (L) 03/10/2020   NEUTROABS 3.4 03/10/2020    Imaging:  No results found.  Medications: I have reviewed the patient's current medications.  Assessment/Plan: 1. Adenocarcinoma of the cecum, stage IIA(T3N0), status post a right colectomy 07/26/2017 ? 0/16 lymph nodes positive, no lymphovascular invasion, perineural invasion present ? MSI-stable, no loss of mismatch repair protein expression ? Surveillance colonoscopy 07/26/2018-no polyps found ? Elevated CEA June 2020, persistently elevated August 2020 ? CTs 02/07/2019-bilateral liver  metastases, no extrahepatic metastatic disease, changes of sarcoidosis in the chest ? Biopsy liver lesion 03/04/2019-metastatic adenocarcinoma, with morphology consistent with metastatic colonic adenocarcinoma, RAS WT ? Cycle 1 FOLFOX 03/12/2019 ? Cycle 2 FOLFOX 03/26/2019, oxaliplatin dose reduced secondary to thrombocytopenia ? Cycle 3 FOLFOX 04/09/2019, oxaliplatin further dose reduced and aspirin held secondary to thrombocytopenia ? Cycle 4 FOLFOX 04/23/2019 ? Cycle 5 FOLFOX 05/07/2019 ? CTs 05/23/2019-decrease in liver lesions. No new or progressive findings. ? Cycle 6 FOLFOX 05/27/2019 ? Cycle 7 FOLFOX 06/14/2019 ? Cycle 8 FOLFOX 07/02/2019-Udenyca held ? Cycle 9 FOLFOX 07/16/2019-Udenyca held ? Cycle 10 FOLFOX 07/30/2019-Udenyca held ? CT abdomen/pelvis 08/13/2019-mild improvement in previously noted liver lesions ? MRI abdomen 08/13/2019-3 liver lesions ? Cycle 1 FOLFIRI/Avastin 09/17/2019 ? Cycle 2 FOLFIRI/Avastin 10/01/2019 ? Cycle 3 FOLFIRI/Avastin 10/15/2019 ? Cycle 4 FOLFIRI/Avastin 11/05/2019 ? Cycle 5 FOLFIRI/Avastin 11/19/2019 ? CTs 11/27/2019-stable liver lesions; no new liver lesions. No other evidence of metastatic disease in the abdomen or pelvis. ? Cycle 1 FOLFIRI/Panitumumab 12/03/2019 ? Cycle 2 FOLFIRI/Panitumumab 12/17/2019 (Panitumumab dose reduced due to rash) ? Cycle 3 FOLFIRI/Panitumumab 01/01/2020 ? Cycle 4 FOLFIRI/Panitumumab 01/14/2020 ? Cycle 5 FOLFIRI/Panitumumab 01/28/2020 ? CT abdomen/pelvis 02/06/2020-decreased size of right hepatic lesions, subtle lesion in the lateral left hepatic lobe-not clearly identified, new evidence of disease progression ? Cycle 6 FOLFIRI/Panitumumab 02/11/2020 ? Cycle 7 FOLFIRI/Panitumumab 03/10/2020 2. History of colon polyps 3. Microcytic anemia January 2019 4. Sarcoidosis 5. Coronary artery disease 6. Port-A-Cath placement, interventional radiology, 03/04/2019 7. Thrombocytopenia, mild thrombocytopenia predating chemotherapy 8. Oxaliplatin  neuropathy 9. Dyspnea on exertion-CT chest 02/25/2020 negative for  PE, infection, pneumonitis; scheduled for PFTs 03/10/2020, followed by pulmonary.   Disposition: [name] appears unchanged.  He has completed 6 cycles of FOLFIRI/Panitumumab.  Cycle 7 was held on 02/25/2020 due to progressive dyspnea.  He was referred for a chest CT which showed no acute changes.  The dyspnea is stable.  He is scheduled for PFTs later today and will continue to follow-up with pulmonary.  Our plan is to proceed with cycle 7 FOLFIRI/Panitumumab today as scheduled.  [name] is in agreement.  We reviewed the CBC from today.  Counts adequate to proceed with treatment.  For the difficulty sleeping he will try Ativan 0.5 mg at bedtime as needed.  He understands he should not drive after taking Ativan.  He will return for lab, follow-up, cycle 8 FOLFIRI/Panitumumab in 2 weeks.  He will contact the office in the interim with any problems.  Plan reviewed with Dr. Benay Spice.    Ned Card ANP/GNP-BC   03/10/2020  9:40 AM

## 2020-03-10 NOTE — Patient Instructions (Signed)

## 2020-03-11 ENCOUNTER — Telehealth: Payer: Self-pay | Admitting: Oncology

## 2020-03-11 DIAGNOSIS — C180 Malignant neoplasm of cecum: Secondary | ICD-10-CM | POA: Diagnosis not present

## 2020-03-11 DIAGNOSIS — M48061 Spinal stenosis, lumbar region without neurogenic claudication: Secondary | ICD-10-CM | POA: Diagnosis not present

## 2020-03-11 DIAGNOSIS — M545 Low back pain: Secondary | ICD-10-CM | POA: Diagnosis not present

## 2020-03-12 ENCOUNTER — Inpatient Hospital Stay: Payer: Medicare PPO | Attending: Nurse Practitioner

## 2020-03-12 ENCOUNTER — Other Ambulatory Visit: Payer: Self-pay

## 2020-03-12 VITALS — BP 145/77 | HR 66 | Resp 18

## 2020-03-12 DIAGNOSIS — Z79899 Other long term (current) drug therapy: Secondary | ICD-10-CM | POA: Insufficient documentation

## 2020-03-12 DIAGNOSIS — C787 Secondary malignant neoplasm of liver and intrahepatic bile duct: Secondary | ICD-10-CM | POA: Insufficient documentation

## 2020-03-12 DIAGNOSIS — C180 Malignant neoplasm of cecum: Secondary | ICD-10-CM | POA: Insufficient documentation

## 2020-03-12 DIAGNOSIS — Z5112 Encounter for antineoplastic immunotherapy: Secondary | ICD-10-CM | POA: Insufficient documentation

## 2020-03-12 DIAGNOSIS — Z5111 Encounter for antineoplastic chemotherapy: Secondary | ICD-10-CM | POA: Diagnosis not present

## 2020-03-12 MED ORDER — SODIUM CHLORIDE 0.9% FLUSH
10.0000 mL | INTRAVENOUS | Status: DC | PRN
  Administered 2020-03-12: 10 mL
  Filled 2020-03-12: qty 10

## 2020-03-12 MED ORDER — HEPARIN SOD (PORK) LOCK FLUSH 100 UNIT/ML IV SOLN
500.0000 [IU] | Freq: Once | INTRAVENOUS | Status: AC | PRN
  Administered 2020-03-12: 500 [IU]
  Filled 2020-03-12: qty 5

## 2020-03-16 ENCOUNTER — Telehealth: Payer: Self-pay | Admitting: Oncology

## 2020-03-16 NOTE — Telephone Encounter (Signed)
Scheduled appointments per 9/29 los. Spoke to patient who is aware of appointments dates and times.

## 2020-03-17 ENCOUNTER — Encounter: Payer: Self-pay | Admitting: Pulmonary Disease

## 2020-03-17 ENCOUNTER — Ambulatory Visit (INDEPENDENT_AMBULATORY_CARE_PROVIDER_SITE_OTHER): Payer: Medicare PPO | Admitting: Pulmonary Disease

## 2020-03-17 ENCOUNTER — Other Ambulatory Visit: Payer: Self-pay

## 2020-03-17 VITALS — BP 124/56 | HR 85 | Temp 97.4°F | Ht 70.5 in | Wt 238.2 lb

## 2020-03-17 DIAGNOSIS — R0609 Other forms of dyspnea: Secondary | ICD-10-CM

## 2020-03-17 DIAGNOSIS — R0600 Dyspnea, unspecified: Secondary | ICD-10-CM | POA: Diagnosis not present

## 2020-03-17 MED ORDER — BREO ELLIPTA 100-25 MCG/INH IN AEPB: 1.0000 | INHALATION_SPRAY | Freq: Every day | RESPIRATORY_TRACT | 11 refills | Status: DC

## 2020-03-17 NOTE — Patient Instructions (Signed)
Nice to meet you!  I wonder if the nasal congestion and the breathing issues are related - something like asthma.  Use Breo 1 puff once a day. I picked a mid strength dose. If not better in 4 weeks I propose we add a pill called singulair (treats nasal allergies and asthma) or increase the dose. We can touch base over the phone or mychart. If able, please send me a mychart message - I find this communication tool most useful in these situations.  Come back in 3 months with Dr. Silas Flood.

## 2020-03-20 ENCOUNTER — Other Ambulatory Visit: Payer: Self-pay | Admitting: Oncology

## 2020-03-22 NOTE — Progress Notes (Signed)
@Patient  ID: Luis [name], male    DOB: 1940/06/27, 79 y.o.   MRN: [id number]  Chief Complaint  Patient presents with  . Follow-up    Recent CT and PFT--occ sob, prod cough with green mucus and occ wheezing.    Referring provider: [name], MD  HPI:   Luis [name] is a 79 year old man with past medical history significant for sarcoidosis treated for years of prednisone currently in remission, significant coronary artery disease status post multiple drug-eluting stents, currently on chemotherapy for adenocarcinoma of the cecum whom we are seeing in follow-up for dyspnea on exertion.  Overall, disorders are largely unchanged.  Formally followed with [name] for sarcoidosis.  This has been quiescent for some time.  Most recent CT scan reviewed which reveals on my interpretation stable bilateral fibrosis, scattered calcified granulomas in the lung as well as significant calcified hilar and mediastinal lymphadenopathy and sequela of prior lung surgery.  He has been seen in clinic recently by [name].  Primary complaint was dyspnea on exertion as well as cough.  These persist.  Chest imaging obtained as above.  PFTs obtained last week which reveal suggestion of mild to moderate restriction on spirometry without significant bronchodilator response.  Moderate restriction confirmed with total lung capacity 69% of predicted.  DLCO corrects to within normal limits with adjustment for hemoglobin.  Cough and shortness of breath coincide with worsening nasal congestion.  Postnasal drip.  Not really using meds to address this this.  He is tolerating chemotherapy well.  Review of labs indicate stable hemoglobin without concern for symptomatic anemia at this time.   Questionaires / Pulmonary Flowsheets:   ACT:  No flowsheet data found.  MMRC: No flowsheet data found.  [facility]:  No flowsheet data found.  Tests:   FENO:  No results found for: NITRICOXIDE  PFT: PFT Results Latest  Ref Rng & Units 03/10/2020 08/30/2017  FVC-Pre L 2.94 3.14  FVC-Predicted Pre % 68 72  FVC-Post L 2.90 3.18  FVC-Predicted Post % 67 73  Pre FEV1/FVC % % 66 70  Post FEV1/FCV % % 68 73  FEV1-Pre L 1.93 2.19  FEV1-Predicted Pre % 62 69  FEV1-Post L 1.96 2.31  DLCO uncorrected ml/min/mmHg 19.10 17.73  DLCO UNC% % 75 52  DLCO corrected ml/min/mmHg 21.40 20.68  DLCO COR %Predicted % 84 61  DLVA Predicted % 118 98  TLC L 5.02 4.99  TLC % Predicted % 69 68  RV % Predicted % 76 71    WALK:  SIX MIN WALK 08/28/2017  Supplimental Oxygen during Test? (L/min) No  Tech Comments: pt walked a fast pace, tolerated walk well.     Imaging: Personally reviewed and as per EMR and discussion in this note  CT ANGIO CHEST PE W OR WO CONTRAST  Result Date: 02/25/2020 CLINICAL DATA:  Chest pain and shortness of breath. History of colon carcinoma. History of sarcoidosis EXAM: CT ANGIOGRAPHY CHEST WITH CONTRAST TECHNIQUE: Multidetector CT imaging of the chest was performed using the standard protocol during bolus administration of intravenous contrast. Multiplanar CT image reconstructions and MIPs were obtained to evaluate the vascular anatomy. CONTRAST:  176mL OMNIPAQUE IOHEXOL 350 MG/ML SOLN COMPARISON:  Chest CT February 07, 2019; chest radiograph February 04, 2020 FINDINGS: Cardiovascular: There is no appreciable pulmonary embolus. There is no thoracic aortic aneurysm or dissection. There are scattered foci of calcification in proximal visualized great vessels. There are multiple foci of aortic atherosclerosis. There are multiple foci of coronary  artery calcification. There is no pericardial effusion or pericardial thickening. Port-A-Cath tip is at the cavoatrial junction. Mediastinum/Nodes: No thyroid lesions evident. There are multiple calcified lymph nodes throughout the hilar and mediastinal regions, likely secondary to prior sarcoidosis. There is no evident adenopathy by size criteria. There is a small hiatal  hernia. Lungs/Pleura: Postoperative change on the right. There are scattered calcified parenchymal lung granulomas, also noted previously. There is scarring in the right upper lobe with peribronchial thickening. Focal consolidation in a portion of the posterior segment right upper lobe is stable in appearance. The degree of peribronchial thickening and nodularity in the right upper lobe region is stable. A lesser degree of scarring is noted in the left apex. There is no new opacity in the lung parenchyma. No frank edema or consolidation. No pleural effusions are evident. Upper Abdomen: Gallbladder is absent. There are foci of upper abdominal aortic and proximal superior mesenteric artery vascular calcification. Visualized upper abdominal structures otherwise appear unremarkable. Musculoskeletal: There is degenerative change in the thoracic spine. No blastic or lytic bone lesions are evident. No appreciable chest wall lesions. Review of the MIP images confirms the above findings. IMPRESSION: 1. No demonstrable pulmonary embolus. No thoracic aortic aneurysm or dissection. There is aortic atherosclerosis as well as foci of coronary artery and great vessel calcification. 2. Areas of parenchymal lung scarring and granulomatous disease, likely secondary to known sarcoidosis history. Stable right upper lobe peribronchovascular thickening and nodularity with a degree of consolidation in the posterior segment right upper lobe, stable, and cicatrization. A lesser degree of scarring is noted in the left apex and in the base regions. There is postoperative change on the right inferiorly. These changes are likely secondary to sarcoidosis. There has been no progression of nodularity or consolidation in the right upper lobe. No new opacity evident. No pleural effusions. 3. Multiple calcified lymph nodes consistent with prior sarcoidosis. No adenopathy by size criteria. 4.  Small hiatal hernia. 5.  Gallbladder absent. Aortic  Atherosclerosis (ICD10-I70.0). Electronically Signed   By: Lowella Grip III M.D.   On: 02/25/2020 13:39    Lab Results: Personally reviewed, eosinophils routinely 200-300 CBC    Component Value Date/Time   WBC 5.0 03/10/2020 0855   WBC 6.4 03/04/2019 1100   RBC 3.68 (L) 03/10/2020 0855   HGB 11.4 (L) 03/10/2020 0855   HGB 15.5 12/25/2018 1306   HCT 34.9 (L) 03/10/2020 0855   HCT 45.7 12/25/2018 1306   PLT 145 (L) 03/10/2020 0855   PLT 142 (L) 12/25/2018 1306   MCV 94.8 03/10/2020 0855   MCV 92 12/25/2018 1306   MCH 31.0 03/10/2020 0855   MCHC 32.7 03/10/2020 0855   RDW 17.1 (H) 03/10/2020 0855   RDW 14.4 12/25/2018 1306   LYMPHSABS 0.5 (L) 03/10/2020 0855   MONOABS 0.8 03/10/2020 0855   EOSABS 0.3 03/10/2020 0855   BASOSABS 0.0 03/10/2020 0855    BMET    Component Value Date/Time   NA 140 03/10/2020 0855   NA 138 12/25/2018 1306   K 4.2 03/10/2020 0855   CL 106 03/10/2020 0855   CO2 27 03/10/2020 0855   GLUCOSE 91 03/10/2020 0855   BUN 13 03/10/2020 0855   BUN 15 12/25/2018 1306   CREATININE 0.84 03/10/2020 0855   CREATININE 1.02 05/16/2016 0937   CALCIUM 8.9 03/10/2020 0855   GFRNONAA >60 03/10/2020 0855   GFRAA >60 03/10/2020 0855    BNP    Component Value Date/Time   BNP 43.4 05/16/2016 3151  ProBNP    Component Value Date/Time   PROBNP 60.0 08/10/2014 1052    Specialty Problems      Pulmonary Problems   Pulmonary fibrosis (HCC)   DOE (dyspnea on exertion)      No Known Allergies  Immunization History  Administered Date(s) Administered  . DTaP / IPV 09/25/2012  . Fluad Quad(high Dose 65+) 02/19/2019  . Influenza Split 03/27/2014  . Influenza, High Dose Seasonal PF 03/27/2014, 01/27/2017, 02/22/2018  . Influenza-Unspecified 02/24/2017, 03/10/2020  . PFIZER SARS-COV-2 Vaccination 07/11/2019, 07/21/2019, 01/26/2020  . Pneumococcal Conjugate-13 03/27/2014  . Pneumococcal Polysaccharide-23 11/10/2009  . Pneumococcal-Unspecified  03/12/2014  . Tdap 03/24/2011, 09/25/2012  . Zoster 03/21/2010  . Zoster Recombinat (Shingrix) 09/01/2018, 02/19/2019    Past Medical History:  Diagnosis Date  . Adenocarcinoma of cecum ([facility]) 06/21/2017  . Arthritis    "mid back; hands; knees" (02/24/2015)  . Basal cell carcinoma    left shoulder; mid chest; right eyelid (02/24/2015)  . CAD (coronary artery disease)    a. 08/2014 Cath/PCI: LM nl, LAD 30p, D1 95 (2.25x12 Resolute Integrity DES), LCX small, RI 100 (attempted PCI) - branches fill via L->L collats, RCA dominant, nl, RPDA/PLA nl, EF 60^. b. 02/24/2015 PCI CTO of Ramus DES x2.  . Chronic bronchitis (HCC)    hx  . Diverticulosis 05/2005  . Elevated lipase   . Fuchs' corneal dystrophy   . History of adenomatous polyp of colon 05/2005   8 mm adenoma  . History of blood transfusion    "related to some of my surgeries"  . Hyperlipidemia   . Hypertension   . Iron deficiency anemia due to chronic blood loss 06/21/2017  . Prostate cancer ([facility]) 2011   S/P seed implant  . Sarcoidosis   . Thrombocytopenia ([facility])    a. Noted on prior labs, unclear of what w/u done.  . Trifascicular block     Tobacco History: Social History   Tobacco Use  Smoking Status Light Tobacco Smoker  . Packs/day: 0.00  . Years: 59.00  . Pack years: 0.00  . Types: Cigars  Smokeless Tobacco Never Used  Tobacco Comment   02/24/2015 "quit cigarettes ~ 2000 ago but smokes cigars ionce/month"   Ready to quit: Not Answered Counseling given: Not Answered Comment: 02/24/2015 "quit cigarettes ~ 2000 ago but smokes cigars ionce/month"   Continue to not smoke  Outpatient Encounter Medications as of 03/17/2020  Medication Sig  . amLODipine (NORVASC) 5 MG tablet TAKE 1 TABLET BY MOUTH TWICE A DAY  . CANNABIDIOL PO Take 12 drops by mouth 2 (two) times a day. CBD OIL  . Cholecalciferol (VITAMIN D3) 2000 units capsule Take 2,000 Units by mouth every evening.   . clopidogrel (PLAVIX) 75 MG tablet TAKE 1 TABLET  (75 MG TOTAL) BY MOUTH DAILY WITH BREAKFAST.  . clotrimazole (LOTRIMIN) 1 % cream Apply 1 application topically 3 (three) times daily as needed (skin irritation/rash).  [name] doxycycline (VIBRA-TABS) 100 MG tablet Take 1 tablet (100 mg total) by mouth 2 (two) times daily.  . fluticasone (CUTIVATE) 0.05 % cream APPLY TO AFFECTED AREAS ON FACE 2 TIMES DAILY  . LORazepam (ATIVAN) 0.5 MG tablet Take 1 tablet (0.5 mg total) by mouth at bedtime as needed for sleep. Do not drive while taking  . magnesium oxide (MAG-OX) 400 MG tablet Take 400 mg by mouth daily.   . metoprolol tartrate (LOPRESSOR) 25 MG tablet TAKE 1/2 TABLET BY MOUTH TWICE A DAY  . nitroGLYCERIN (NITROSTAT) 0.4 MG SL tablet  PLACE 1 TABLET UNDER THE TONGUE EVERY 5 MINUTES AS NEEDED FOR CHEST PAIN FOR 3 DOSES  . prochlorperazine (COMPAZINE) 10 MG tablet Take 1 tablet (10 mg total) by mouth every 6 (six) hours as needed.  . ranolazine (RANEXA) 500 MG 12 hr tablet Take 1 tablet (500 mg total) by mouth 2 (two) times daily.  . rosuvastatin (CRESTOR) 20 MG tablet TAKE 1 TABLET BY MOUTH EVERY DAY  . traMADol (ULTRAM) 50 MG tablet Take 100 mg by mouth 2 (two) times a day.   . diphenhydrAMINE HCl, Sleep, 25 MG CAPS Take 25 mg by mouth at bedtime as needed.  . fluticasone furoate-vilanterol (BREO ELLIPTA) 100-25 MCG/INH AEPB Inhale 1 puff into the lungs daily.   No facility-administered encounter medications on file as of 03/17/2020.     Review of Systems  Review of Systems  No chest pain exertion.  No orthopnea or PND.  Comprehensive review of systems otherwise negative. Physical Exam  BP (!) 124/56 (BP Location: Left Arm, Cuff Size: Normal)   Pulse 85   Temp (!) 97.4 F (36.3 C) (Temporal)   Ht 5' 10.5" (1.791 m)   Wt 238 lb 3.2 oz (108 kg)   SpO2 99%   BMI 33.70 kg/m   Wt Readings from Last 5 Encounters:  03/17/20 238 lb 3.2 oz (108 kg)  03/10/20 237 lb (107.5 kg)  02/25/20 234 lb 3.2 oz (106.2 kg)  02/11/20 234 lb (106.1 kg)    02/04/20 234 lb (106.1 kg)    BMI Readings from Last 5 Encounters:  03/17/20 33.70 kg/m  03/10/20 33.05 kg/m  02/25/20 32.66 kg/m  02/11/20 32.64 kg/m  02/04/20 32.64 kg/m     Physical Exam General: Well-appearing, no acute distress Eyes: EOMI, no icterus Neck: No JVD supple Respiratory clear to station bilaterally, no wheezes or crackles Cardiovascular: Regular rhythm, no murmurs Abdomen: Nondistended, bowel sounds present MSK: No joint effusion, no synovitis Gait, no weakness Psych: Normal mood, full affect   Assessment & Plan:   Dyspnea on exertion: Suspect is multifactorial.  PFTs reveal moderate restriction likely sequela of burned-out sarcoid in addition to prior lung resection when this was diagnosed many years ago.  No evidence of active pulmonary sarcoid on most recent imaging.  No evidence of any pneumonitis from chemotherapy regimen.  He endorses nasal congestion that coincides with worsening dyspnea.  Raises high suspicion for asthma.  Notably, he has had eosinophil levels as high as 300 as well.  Asthma: Presumed diagnosis given atopic symptoms and dyspnea on exertion.  Start mid dose of Breo for ICS/LABA therapy.  If not improving in the coming weeks anticipate adding montelukast.  Consider increasing Breo dose as well.  Nasal congestion: Seasonal allergies.  Likely exacerbating presumed asthma.  Consider adding montelukast as above in the coming weeks.  Return in about 3 months (around 06/17/2020).   Lanier Clam, MD 03/22/2020

## 2020-03-24 ENCOUNTER — Other Ambulatory Visit: Payer: Medicare PPO

## 2020-03-24 ENCOUNTER — Ambulatory Visit: Payer: Medicare PPO

## 2020-03-24 ENCOUNTER — Ambulatory Visit: Payer: Medicare PPO | Admitting: Nurse Practitioner

## 2020-03-25 ENCOUNTER — Other Ambulatory Visit: Payer: Medicare PPO

## 2020-03-25 ENCOUNTER — Inpatient Hospital Stay: Payer: Medicare PPO

## 2020-03-25 ENCOUNTER — Ambulatory Visit: Payer: Medicare PPO | Admitting: Oncology

## 2020-03-25 ENCOUNTER — Other Ambulatory Visit: Payer: Self-pay

## 2020-03-25 ENCOUNTER — Inpatient Hospital Stay: Payer: Medicare PPO | Admitting: Oncology

## 2020-03-25 ENCOUNTER — Encounter: Payer: Self-pay | Admitting: Oncology

## 2020-03-25 VITALS — BP 122/60 | HR 66 | Temp 98.0°F | Resp 20 | Ht 70.0 in | Wt 241.4 lb

## 2020-03-25 DIAGNOSIS — Z5111 Encounter for antineoplastic chemotherapy: Secondary | ICD-10-CM | POA: Diagnosis not present

## 2020-03-25 DIAGNOSIS — C180 Malignant neoplasm of cecum: Secondary | ICD-10-CM

## 2020-03-25 DIAGNOSIS — C787 Secondary malignant neoplasm of liver and intrahepatic bile duct: Secondary | ICD-10-CM | POA: Diagnosis not present

## 2020-03-25 DIAGNOSIS — Z95828 Presence of other vascular implants and grafts: Secondary | ICD-10-CM

## 2020-03-25 DIAGNOSIS — Z5112 Encounter for antineoplastic immunotherapy: Secondary | ICD-10-CM | POA: Diagnosis not present

## 2020-03-25 DIAGNOSIS — Z79899 Other long term (current) drug therapy: Secondary | ICD-10-CM | POA: Diagnosis not present

## 2020-03-25 LAB — CBC WITH DIFFERENTIAL (CANCER CENTER ONLY)
Abs Immature Granulocytes: 0.01 10*3/uL (ref 0.00–0.07)
Basophils Absolute: 0 10*3/uL (ref 0.0–0.1)
Basophils Relative: 1 %
Eosinophils Absolute: 0.3 10*3/uL (ref 0.0–0.5)
Eosinophils Relative: 10 %
HCT: 33.1 % — ABNORMAL LOW (ref 39.0–52.0)
Hemoglobin: 10.8 g/dL — ABNORMAL LOW (ref 13.0–17.0)
Immature Granulocytes: 0 %
Lymphocytes Relative: 12 %
Lymphs Abs: 0.4 10*3/uL — ABNORMAL LOW (ref 0.7–4.0)
MCH: 31.1 pg (ref 26.0–34.0)
MCHC: 32.6 g/dL (ref 30.0–36.0)
MCV: 95.4 fL (ref 80.0–100.0)
Monocytes Absolute: 0.5 10*3/uL (ref 0.1–1.0)
Monocytes Relative: 15 %
Neutro Abs: 2.1 10*3/uL (ref 1.7–7.7)
Neutrophils Relative %: 62 %
Platelet Count: 134 10*3/uL — ABNORMAL LOW (ref 150–400)
RBC: 3.47 MIL/uL — ABNORMAL LOW (ref 4.22–5.81)
RDW: 16.7 % — ABNORMAL HIGH (ref 11.5–15.5)
WBC Count: 3.4 10*3/uL — ABNORMAL LOW (ref 4.0–10.5)
nRBC: 0 % (ref 0.0–0.2)

## 2020-03-25 LAB — CMP (CANCER CENTER ONLY)
ALT: 32 U/L (ref 0–44)
AST: 37 U/L (ref 15–41)
Albumin: 3.3 g/dL — ABNORMAL LOW (ref 3.5–5.0)
Alkaline Phosphatase: 73 U/L (ref 38–126)
Anion gap: 5 (ref 5–15)
BUN: 12 mg/dL (ref 8–23)
CO2: 27 mmol/L (ref 22–32)
Calcium: 9.1 mg/dL (ref 8.9–10.3)
Chloride: 107 mmol/L (ref 98–111)
Creatinine: 0.82 mg/dL (ref 0.61–1.24)
GFR, Estimated: >60 mL/min (ref >60)
Glucose, Bld: 171 mg/dL — ABNORMAL HIGH (ref 70–99)
Potassium: 3.9 mmol/L (ref 3.5–5.1)
Sodium: 139 mmol/L (ref 135–145)
Total Bilirubin: 0.5 mg/dL (ref 0.3–1.2)
Total Protein: 5.9 g/dL — ABNORMAL LOW (ref 6.5–8.1)

## 2020-03-25 LAB — MAGNESIUM: Magnesium: 1.8 mg/dL (ref 1.7–2.4)

## 2020-03-25 MED ORDER — PALONOSETRON HCL INJECTION 0.25 MG/5ML
INTRAVENOUS | Status: AC
  Filled 2020-03-25: qty 5

## 2020-03-25 MED ORDER — SODIUM CHLORIDE 0.9 % IV SOLN
175.0000 mg/m2 | Freq: Once | INTRAVENOUS | Status: AC
  Administered 2020-03-25: 400 mg via INTRAVENOUS
  Filled 2020-03-25: qty 15

## 2020-03-25 MED ORDER — SODIUM CHLORIDE 0.9% FLUSH
10.0000 mL | INTRAVENOUS | Status: DC | PRN
  Administered 2020-03-25: 10 mL
  Filled 2020-03-25: qty 10

## 2020-03-25 MED ORDER — SODIUM CHLORIDE 0.9 % IV SOLN
10.0000 mg | Freq: Once | INTRAVENOUS | Status: AC
  Administered 2020-03-25: 10 mg via INTRAVENOUS
  Filled 2020-03-25: qty 10

## 2020-03-25 MED ORDER — SODIUM CHLORIDE 0.9 % IV SOLN
2000.0000 mg/m2 | INTRAVENOUS | Status: DC
  Administered 2020-03-25: 4650 mg via INTRAVENOUS
  Filled 2020-03-25: qty 93

## 2020-03-25 MED ORDER — SODIUM CHLORIDE 0.9 % IV SOLN
Freq: Once | INTRAVENOUS | Status: AC
  Filled 2020-03-25: qty 250

## 2020-03-25 MED ORDER — PALONOSETRON HCL INJECTION 0.25 MG/5ML
0.2500 mg | Freq: Once | INTRAVENOUS | Status: AC
  Administered 2020-03-25: 0.25 mg via INTRAVENOUS

## 2020-03-25 MED ORDER — SODIUM CHLORIDE 0.9 % IV SOLN
3.0000 mg/kg | Freq: Once | INTRAVENOUS | Status: AC
  Administered 2020-03-25: 300 mg via INTRAVENOUS
  Filled 2020-03-25: qty 15

## 2020-03-25 MED ORDER — SODIUM CHLORIDE 0.9 % IV SOLN
400.0000 mg/m2 | Freq: Once | INTRAVENOUS | Status: AC
  Administered 2020-03-25: 928 mg via INTRAVENOUS
  Filled 2020-03-25: qty 46.4

## 2020-03-25 MED ORDER — ATROPINE SULFATE 1 MG/ML IJ SOLN
INTRAMUSCULAR | Status: AC
  Filled 2020-03-25: qty 1

## 2020-03-25 MED ORDER — ATROPINE SULFATE 1 MG/ML IJ SOLN
0.5000 mg | Freq: Once | INTRAMUSCULAR | Status: AC | PRN
  Administered 2020-03-25: 0.5 mg via INTRAVENOUS

## 2020-03-25 NOTE — Patient Instructions (Signed)

## 2020-03-25 NOTE — Patient Instructions (Signed)
[facility] Discharge Instructions for Patients Receiving Chemotherapy  Today you received the following chemotherapy agents: panitumumab/irinotecan/leucovorin/fluorouracil  To help prevent nausea and vomiting after your treatment, we encourage you to take your nausea medication as directed.   If you develop nausea and vomiting that is not controlled by your nausea medication, call the clinic.   BELOW ARE SYMPTOMS THAT SHOULD BE REPORTED IMMEDIATELY:  *FEVER GREATER THAN 100.5 F  *CHILLS WITH OR WITHOUT FEVER  NAUSEA AND VOMITING THAT IS NOT CONTROLLED WITH YOUR NAUSEA MEDICATION  *UNUSUAL SHORTNESS OF BREATH  *UNUSUAL BRUISING OR BLEEDING  TENDERNESS IN MOUTH AND THROAT WITH OR WITHOUT PRESENCE OF ULCERS  *URINARY PROBLEMS  *BOWEL PROBLEMS  UNUSUAL RASH Items with * indicate a potential emergency and should be followed up as soon as possible.  Feel free to call the clinic should you have any questions or concerns. The clinic phone number is [phone].  Please show the [facility] at check-in to the Emergency Department and triage nurse.

## 2020-03-25 NOTE — Progress Notes (Signed)
[facility] OFFICE PROGRESS NOTE   Diagnosis: Colon cancer  INTERVAL HISTORY:   Luis [name] completed another cycle of FOLFIRI and Panitumumab on 03/10/2020.  No mouth sores, nausea, or diarrhea.  He has a persistent rash over the trunk and extremities.  He is taking doxycycline.  He uses fluticasone cream as needed.  He has seen pulmonary medicine for evaluation of dyspnea.  Pulmonary function studies revealed moderate restriction.  He was diagnosed with asthma.  He was started on an inhaler.  He reports partial improvement in dyspnea.  Objective:  Vital signs in last 24 hours:  Blood pressure 122/60, pulse 66, temperature 98 F (36.7 C), temperature source Tympanic, resp. rate 20, height $RemoveBe'5\' 10"'EWebeluzn$  (1.778 m), weight 241 lb 6.4 oz (109.5 kg), SpO2 98 %.    HEENT: No thrush or ulcers Resp: Lungs clear bilaterally Cardio: Regular rate and rhythm Vascular: Trace lower leg edema bilaterally  Skin: Acne type rash over the trunk and extremities  Portacath/PICC-without erythema  Lab Results:  Lab Results  Component Value Date   WBC 3.4 (L) 03/25/2020   HGB 10.8 (L) 03/25/2020   HCT 33.1 (L) 03/25/2020   MCV 95.4 03/25/2020   PLT 134 (L) 03/25/2020   NEUTROABS 2.1 03/25/2020    CMP  Lab Results  Component Value Date   NA 139 03/25/2020   K 3.9 03/25/2020   CL 107 03/25/2020   CO2 27 03/25/2020   GLUCOSE 171 (H) 03/25/2020   BUN 12 03/25/2020   CREATININE 0.82 03/25/2020   CALCIUM 9.1 03/25/2020   PROT 5.9 (L) 03/25/2020   ALBUMIN 3.3 (L) 03/25/2020   AST 37 03/25/2020   ALT 32 03/25/2020   ALKPHOS 73 03/25/2020   BILITOT 0.5 03/25/2020   GFRNONAA >60 03/25/2020   GFRAA >60 03/10/2020    Lab Results  Component Value Date   CEA1 2.34 03/10/2020    Medications: I have reviewed the patient's current medications.   Assessment/Plan: 1. Adenocarcinoma of the cecum, stage IIA(T3N0), status post a right colectomy 07/26/2017 ? 0/16 lymph nodes positive,  no lymphovascular invasion, perineural invasion present ? MSI-stable, no loss of mismatch repair protein expression ? Surveillance colonoscopy 07/26/2018-no polyps found ? Elevated CEA June 2020, persistently elevated August 2020 ? CTs 02/07/2019-bilateral liver metastases, no extrahepatic metastatic disease, changes of sarcoidosis in the chest ? Biopsy liver lesion 03/04/2019-metastatic adenocarcinoma, with morphology consistent with metastatic colonic adenocarcinoma, RAS WT ? Cycle 1 FOLFOX 03/12/2019 ? Cycle 2 FOLFOX 03/26/2019, oxaliplatin dose reduced secondary to thrombocytopenia ? Cycle 3 FOLFOX 04/09/2019, oxaliplatin further dose reduced and aspirin held secondary to thrombocytopenia ? Cycle 4 FOLFOX 04/23/2019 ? Cycle 5 FOLFOX 05/07/2019 ? CTs 05/23/2019-decrease in liver lesions. No new or progressive findings. ? Cycle 6 FOLFOX 05/27/2019 ? Cycle 7 FOLFOX 06/14/2019 ? Cycle 8 FOLFOX 07/02/2019-Udenyca held ? Cycle 9 FOLFOX 07/16/2019-Udenyca held ? Cycle 10 FOLFOX 07/30/2019-Udenyca held ? CT abdomen/pelvis 08/13/2019-mild improvement in previously noted liver lesions ? MRI abdomen 08/13/2019-3 liver lesions ? Cycle 1 FOLFIRI/Avastin 09/17/2019 ? Cycle 2 FOLFIRI/Avastin 10/01/2019 ? Cycle 3 FOLFIRI/Avastin 10/15/2019 ? Cycle 4 FOLFIRI/Avastin 11/05/2019 ? Cycle 5 FOLFIRI/Avastin 11/19/2019 ? CTs 11/27/2019-stable liver lesions; no new liver lesions. No other evidence of metastatic disease in the abdomen or pelvis. ? Cycle 1 FOLFIRI/Panitumumab 12/03/2019 ? Cycle 2 FOLFIRI/Panitumumab 12/17/2019 (Panitumumab dose reduced due to rash) ? Cycle 3 FOLFIRI/Panitumumab 01/01/2020 ? Cycle 4 FOLFIRI/Panitumumab 01/14/2020 ? Cycle 5 FOLFIRI/Panitumumab 01/28/2020 ? CT abdomen/pelvis 02/06/2020-decreased size of right hepatic lesions, subtle lesion  in the lateral left hepatic lobe-not clearly identified, no evidence of disease progression ? Cycle 6 FOLFIRI/Panitumumab 02/11/2020 ? Cycle 7 FOLFIRI/Panitumumab  03/10/2020 ? Cycle 8 FOLFIRI/Panitumumab 03/26/2020 2. History of colon polyps 3. Microcytic anemia January 2019 4. Sarcoidosis 5. Coronary artery disease 6. Port-A-Cath placement, interventional radiology, 03/04/2019 7. Thrombocytopenia, mild thrombocytopenia predating chemotherapy 8. Oxaliplatin neuropathy 9. Dyspnea on exertion-CT chest 02/25/2020 negative for PE, infection, pneumonitis; scheduled for PFTs 03/10/2020, followed by pulmonary.    Disposition: Luis [name] appears stable.  He will complete another cycle of FOLFIRI today.  He will continue follow-up with pulmonary medicine for evaluation of the exertional dyspnea.  Luis [name] will return for an office visit and chemotherapy on 04/14/2020.  He will be scheduled for a restaging CT after the next cycle of chemotherapy.  Betsy Coder, MD  03/25/2020  8:37 AM

## 2020-03-26 ENCOUNTER — Other Ambulatory Visit: Payer: Self-pay | Admitting: *Deleted

## 2020-03-26 MED ORDER — GABAPENTIN 100 MG PO CAPS: 200.0000 mg | ORAL_CAPSULE | Freq: Every day | ORAL | 1 refills | Status: DC

## 2020-03-26 NOTE — Telephone Encounter (Signed)
Dr. Silas Flood, Please see patients mychart message and advise.  Thank you.

## 2020-03-27 ENCOUNTER — Ambulatory Visit: Payer: Medicare PPO

## 2020-03-27 ENCOUNTER — Other Ambulatory Visit: Payer: Self-pay

## 2020-03-27 VITALS — BP 124/57 | HR 65 | Temp 97.7°F | Resp 18

## 2020-03-27 DIAGNOSIS — C180 Malignant neoplasm of cecum: Secondary | ICD-10-CM | POA: Diagnosis not present

## 2020-03-27 DIAGNOSIS — Z5111 Encounter for antineoplastic chemotherapy: Secondary | ICD-10-CM | POA: Diagnosis not present

## 2020-03-27 DIAGNOSIS — Z79899 Other long term (current) drug therapy: Secondary | ICD-10-CM | POA: Diagnosis not present

## 2020-03-27 DIAGNOSIS — Z5112 Encounter for antineoplastic immunotherapy: Secondary | ICD-10-CM | POA: Diagnosis not present

## 2020-03-27 DIAGNOSIS — C787 Secondary malignant neoplasm of liver and intrahepatic bile duct: Secondary | ICD-10-CM | POA: Diagnosis not present

## 2020-03-27 MED ORDER — SODIUM CHLORIDE 0.9% FLUSH
10.0000 mL | INTRAVENOUS | Status: DC | PRN
  Administered 2020-03-27: 10 mL
  Filled 2020-03-27: qty 10

## 2020-03-27 MED ORDER — HEPARIN SOD (PORK) LOCK FLUSH 100 UNIT/ML IV SOLN
500.0000 [IU] | Freq: Once | INTRAVENOUS | Status: AC | PRN
  Administered 2020-03-27: 500 [IU]
  Filled 2020-03-27: qty 5

## 2020-03-27 NOTE — Patient Instructions (Signed)

## 2020-04-09 ENCOUNTER — Other Ambulatory Visit: Payer: Self-pay | Admitting: Oncology

## 2020-04-09 DIAGNOSIS — M545 Low back pain, unspecified: Secondary | ICD-10-CM | POA: Diagnosis not present

## 2020-04-09 DIAGNOSIS — M48061 Spinal stenosis, lumbar region without neurogenic claudication: Secondary | ICD-10-CM | POA: Diagnosis not present

## 2020-04-10 DIAGNOSIS — C180 Malignant neoplasm of cecum: Secondary | ICD-10-CM | POA: Diagnosis not present

## 2020-04-13 ENCOUNTER — Telehealth: Payer: Self-pay | Admitting: Pulmonary Disease

## 2020-04-13 NOTE — Telephone Encounter (Signed)
Spoke with pt. He is wanting to schedule a follow up with Dr. Silas Flood to discuss how using [facility] is going. Not sure why this wasn't scheduled when he called in. Pt has been scheduled to see Dr. Silas Flood on 04/28/20 at 1630. Nothing further was needed.

## 2020-04-14 ENCOUNTER — Ambulatory Visit: Payer: Medicare PPO | Admitting: Oncology

## 2020-04-14 ENCOUNTER — Inpatient Hospital Stay (HOSPITAL_BASED_OUTPATIENT_CLINIC_OR_DEPARTMENT_OTHER): Payer: Medicare PPO | Admitting: Nurse Practitioner

## 2020-04-14 ENCOUNTER — Inpatient Hospital Stay: Payer: Medicare PPO

## 2020-04-14 ENCOUNTER — Other Ambulatory Visit: Payer: Self-pay

## 2020-04-14 ENCOUNTER — Other Ambulatory Visit: Payer: Medicare PPO

## 2020-04-14 ENCOUNTER — Inpatient Hospital Stay: Payer: Medicare PPO | Attending: Nurse Practitioner

## 2020-04-14 ENCOUNTER — Ambulatory Visit: Payer: Medicare PPO

## 2020-04-14 ENCOUNTER — Encounter: Payer: Self-pay | Admitting: Nurse Practitioner

## 2020-04-14 VITALS — BP 115/53 | HR 61 | Temp 98.5°F | Resp 16 | Ht 70.0 in | Wt 240.4 lb

## 2020-04-14 DIAGNOSIS — Z79899 Other long term (current) drug therapy: Secondary | ICD-10-CM | POA: Diagnosis not present

## 2020-04-14 DIAGNOSIS — Z5111 Encounter for antineoplastic chemotherapy: Secondary | ICD-10-CM | POA: Diagnosis not present

## 2020-04-14 DIAGNOSIS — Z95828 Presence of other vascular implants and grafts: Secondary | ICD-10-CM

## 2020-04-14 DIAGNOSIS — C180 Malignant neoplasm of cecum: Secondary | ICD-10-CM

## 2020-04-14 DIAGNOSIS — Z5112 Encounter for antineoplastic immunotherapy: Secondary | ICD-10-CM | POA: Diagnosis not present

## 2020-04-14 LAB — CMP (CANCER CENTER ONLY)
ALT: 36 U/L (ref 0–44)
AST: 35 U/L (ref 15–41)
Albumin: 3.4 g/dL — ABNORMAL LOW (ref 3.5–5.0)
Alkaline Phosphatase: 68 U/L (ref 38–126)
Anion gap: 6 (ref 5–15)
BUN: 15 mg/dL (ref 8–23)
CO2: 27 mmol/L (ref 22–32)
Calcium: 8.8 mg/dL — ABNORMAL LOW (ref 8.9–10.3)
Chloride: 105 mmol/L (ref 98–111)
Creatinine: 0.77 mg/dL (ref 0.61–1.24)
GFR, Estimated: >60 mL/min (ref >60)
Glucose, Bld: 101 mg/dL — ABNORMAL HIGH (ref 70–99)
Potassium: 4.2 mmol/L (ref 3.5–5.1)
Sodium: 138 mmol/L (ref 135–145)
Total Bilirubin: 0.5 mg/dL (ref 0.3–1.2)
Total Protein: 5.8 g/dL — ABNORMAL LOW (ref 6.5–8.1)

## 2020-04-14 LAB — CBC WITH DIFFERENTIAL (CANCER CENTER ONLY)
Abs Immature Granulocytes: 0.09 10*3/uL — ABNORMAL HIGH (ref 0.00–0.07)
Basophils Absolute: 0 10*3/uL (ref 0.0–0.1)
Basophils Relative: 1 %
Eosinophils Absolute: 0.5 10*3/uL (ref 0.0–0.5)
Eosinophils Relative: 8 %
HCT: 33.1 % — ABNORMAL LOW (ref 39.0–52.0)
Hemoglobin: 10.8 g/dL — ABNORMAL LOW (ref 13.0–17.0)
Immature Granulocytes: 2 %
Lymphocytes Relative: 7 %
Lymphs Abs: 0.4 10*3/uL — ABNORMAL LOW (ref 0.7–4.0)
MCH: 30.7 pg (ref 26.0–34.0)
MCHC: 32.6 g/dL (ref 30.0–36.0)
MCV: 94 fL (ref 80.0–100.0)
Monocytes Absolute: 0.9 10*3/uL (ref 0.1–1.0)
Monocytes Relative: 15 %
Neutro Abs: 4 10*3/uL (ref 1.7–7.7)
Neutrophils Relative %: 67 %
Platelet Count: 197 10*3/uL (ref 150–400)
RBC: 3.52 MIL/uL — ABNORMAL LOW (ref 4.22–5.81)
RDW: 17.3 % — ABNORMAL HIGH (ref 11.5–15.5)
WBC Count: 5.8 10*3/uL (ref 4.0–10.5)
nRBC: 0.5 % — ABNORMAL HIGH (ref 0.0–0.2)

## 2020-04-14 LAB — MAGNESIUM: Magnesium: 1.9 mg/dL (ref 1.7–2.4)

## 2020-04-14 LAB — CEA (IN HOUSE-CHCC): CEA (CHCC-In House): 2.1 ng/mL (ref 0.00–5.00)

## 2020-04-14 MED ORDER — ATROPINE SULFATE 1 MG/ML IJ SOLN
INTRAMUSCULAR | Status: AC
  Filled 2020-04-14: qty 1

## 2020-04-14 MED ORDER — ATROPINE SULFATE 1 MG/ML IJ SOLN
0.5000 mg | Freq: Once | INTRAMUSCULAR | Status: AC | PRN
  Administered 2020-04-14: 0.5 mg via INTRAVENOUS

## 2020-04-14 MED ORDER — HEPARIN SOD (PORK) LOCK FLUSH 100 UNIT/ML IV SOLN
500.0000 [IU] | Freq: Once | INTRAVENOUS | Status: DC | PRN
  Filled 2020-04-14: qty 5

## 2020-04-14 MED ORDER — PALONOSETRON HCL INJECTION 0.25 MG/5ML
INTRAVENOUS | Status: AC
  Filled 2020-04-14: qty 5

## 2020-04-14 MED ORDER — PALONOSETRON HCL INJECTION 0.25 MG/5ML
0.2500 mg | Freq: Once | INTRAVENOUS | Status: AC
  Administered 2020-04-14: 0.25 mg via INTRAVENOUS

## 2020-04-14 MED ORDER — SODIUM CHLORIDE 0.9 % IV SOLN
Freq: Once | INTRAVENOUS | Status: AC
  Filled 2020-04-14: qty 250

## 2020-04-14 MED ORDER — SODIUM CHLORIDE 0.9 % IV SOLN
400.0000 mg/m2 | Freq: Once | INTRAVENOUS | Status: AC
  Administered 2020-04-14: 928 mg via INTRAVENOUS
  Filled 2020-04-14: qty 46.4

## 2020-04-14 MED ORDER — SODIUM CHLORIDE 0.9 % IV SOLN
3.0000 mg/kg | Freq: Once | INTRAVENOUS | Status: AC
  Administered 2020-04-14: 300 mg via INTRAVENOUS
  Filled 2020-04-14: qty 15

## 2020-04-14 MED ORDER — SODIUM CHLORIDE 0.9 % IV SOLN
10.0000 mg | Freq: Once | INTRAVENOUS | Status: AC
  Administered 2020-04-14: 10 mg via INTRAVENOUS
  Filled 2020-04-14: qty 10

## 2020-04-14 MED ORDER — SODIUM CHLORIDE 0.9% FLUSH
10.0000 mL | INTRAVENOUS | Status: DC | PRN
  Administered 2020-04-14: 10 mL
  Filled 2020-04-14: qty 10

## 2020-04-14 MED ORDER — SODIUM CHLORIDE 0.9 % IV SOLN
2000.0000 mg/m2 | INTRAVENOUS | Status: DC
  Administered 2020-04-14: 4650 mg via INTRAVENOUS
  Filled 2020-04-14: qty 93

## 2020-04-14 MED ORDER — SODIUM CHLORIDE 0.9 % IV SOLN
175.0000 mg/m2 | Freq: Once | INTRAVENOUS | Status: AC
  Administered 2020-04-14: 400 mg via INTRAVENOUS
  Filled 2020-04-14: qty 15

## 2020-04-14 MED ORDER — SODIUM CHLORIDE 0.9% FLUSH
10.0000 mL | INTRAVENOUS | Status: DC | PRN
  Filled 2020-04-14: qty 10

## 2020-04-14 NOTE — Patient Instructions (Signed)

## 2020-04-14 NOTE — Patient Instructions (Signed)
[facility] Discharge Instructions for Patients Receiving Chemotherapy  Today you received the following chemotherapy agents panitumab, irinotecan, leucovorin, fluorouracil.  To help prevent nausea and vomiting after your treatment, we encourage you to take your nausea medication as directed.    If you develop nausea and vomiting that is not controlled by your nausea medication, call the clinic.   BELOW ARE SYMPTOMS THAT SHOULD BE REPORTED IMMEDIATELY:  *FEVER GREATER THAN 100.5 F  *CHILLS WITH OR WITHOUT FEVER  NAUSEA AND VOMITING THAT IS NOT CONTROLLED WITH YOUR NAUSEA MEDICATION  *UNUSUAL SHORTNESS OF BREATH  *UNUSUAL BRUISING OR BLEEDING  TENDERNESS IN MOUTH AND THROAT WITH OR WITHOUT PRESENCE OF ULCERS  *URINARY PROBLEMS  *BOWEL PROBLEMS  UNUSUAL RASH Items with * indicate a potential emergency and should be followed up as soon as possible.  Feel free to call the clinic should you have any questions or concerns. The clinic phone number is [phone].  Please show the [facility] at check-in to the Emergency Department and triage nurse.

## 2020-04-14 NOTE — Progress Notes (Signed)
[place] OFFICE PROGRESS NOTE   Diagnosis: Colon cancer  INTERVAL HISTORY:   [name] returns as scheduled.  He completed cycle 8 FOLFIRI/Panitumumab 03/26/2020.  He denies nausea/vomiting.  He had a few mouth sores which did not affect his ability to eat/drink.  Some constipation.  No diarrhea.  No hand or foot pain or redness.  Skin rash is better.  He continues doxycycline.  Dyspnea has improved beginning a new inhaler.  He notes improvement in neuropathy symptoms since beginning gabapentin 200 mg daily.  He plans to decrease to 100 mg daily.  Objective:  Vital signs in last 24 hours:  Blood pressure (!) 115/53, pulse 61, temperature 98.5 F (36.9 C), temperature source Tympanic, resp. rate 16, height $RemoveBe'5\' 10"'gXzenrdCY$  (1.778 m), weight 240 lb 6.4 oz (109 kg), SpO2 99 %.    HEENT: No thrush or ulcers. Resp: Lungs clear bilaterally. Cardio: Regular rate and rhythm. GI: No hepatomegaly. Vascular: Trace edema at the lower legs bilaterally.  Skin: Acne type rash over face, trunk, extremities. Port-A-Cath without erythema.   Lab Results:  Lab Results  Component Value Date   WBC 5.8 04/14/2020   HGB 10.8 (L) 04/14/2020   HCT 33.1 (L) 04/14/2020   MCV 94.0 04/14/2020   PLT 197 04/14/2020   NEUTROABS 4.0 04/14/2020    Imaging:  No results found.  Medications: I have reviewed the patient's current medications.  Assessment/Plan: 1. Adenocarcinoma of the cecum, stage IIA(T3N0), status post a right colectomy 07/26/2017 ? 0/16 lymph nodes positive, no lymphovascular invasion, perineural invasion present ? MSI-stable, no loss of mismatch repair protein expression ? Surveillance colonoscopy 07/26/2018-no polyps found ? Elevated CEA June 2020, persistently elevated August 2020 ? CTs 02/07/2019-bilateral liver metastases, no extrahepatic metastatic disease, changes of sarcoidosis in the chest ? Biopsy liver lesion 03/04/2019-metastatic adenocarcinoma, with morphology  consistent with metastatic colonic adenocarcinoma, RAS WT ? Cycle 1 FOLFOX 03/12/2019 ? Cycle 2 FOLFOX 03/26/2019, oxaliplatin dose reduced secondary to thrombocytopenia ? Cycle 3 FOLFOX 04/09/2019, oxaliplatin further dose reduced and aspirin held secondary to thrombocytopenia ? Cycle 4 FOLFOX 04/23/2019 ? Cycle 5 FOLFOX 05/07/2019 ? CTs 05/23/2019-decrease in liver lesions. No new or progressive findings. ? Cycle 6 FOLFOX 05/27/2019 ? Cycle 7 FOLFOX 06/14/2019 ? Cycle 8 FOLFOX 07/02/2019-Udenyca held ? Cycle 9 FOLFOX 07/16/2019-Udenyca held ? Cycle 10 FOLFOX 07/30/2019-Udenyca held ? CT abdomen/pelvis 08/13/2019-mild improvement in previously noted liver lesions ? MRI abdomen 08/13/2019-3 liver lesions ? Cycle 1 FOLFIRI/Avastin 09/17/2019 ? Cycle 2 FOLFIRI/Avastin 10/01/2019 ? Cycle 3 FOLFIRI/Avastin 10/15/2019 ? Cycle 4 FOLFIRI/Avastin 11/05/2019 ? Cycle 5 FOLFIRI/Avastin 11/19/2019 ? CTs 11/27/2019-stable liver lesions; no new liver lesions. No other evidence of metastatic disease in the abdomen or pelvis. ? Cycle 1 FOLFIRI/Panitumumab 12/03/2019 ? Cycle 2 FOLFIRI/Panitumumab 12/17/2019 (Panitumumab dose reduced due to rash) ? Cycle 3 FOLFIRI/Panitumumab 01/01/2020 ? Cycle 4 FOLFIRI/Panitumumab 01/14/2020 ? Cycle 5 FOLFIRI/Panitumumab 01/28/2020 ? CT abdomen/pelvis 02/06/2020-decreased size of right hepatic lesions, subtle lesion in the lateral left hepatic lobe-not clearly identified, no evidence of disease progression ? Cycle 6 FOLFIRI/Panitumumab 02/11/2020 ? Cycle 7 FOLFIRI/Panitumumab 03/10/2020 ? Cycle 8 FOLFIRI/Panitumumab 03/26/2020 ? Cycle 9 FOLFIRI/Panitumumab 04/14/2020 2. History of colon polyps 3. Microcytic anemia January 2019 4. Sarcoidosis 5. Coronary artery disease 6. Port-A-Cath placement, interventional radiology, 03/04/2019 7. Thrombocytopenia, mild thrombocytopenia predating chemotherapy 8. Oxaliplatin neuropathy 9. Dyspnea on exertion-CT chest 02/25/2020 negative for PE, infection,  pneumonitis; scheduled for PFTs 03/10/2020, followed by pulmonary.    Disposition: [name] appears stable.  He  has completed 8 cycles of FOLFIRI/Panitumumab.  Overall he is tolerating treatment well.  Plan to proceed with cycle 9 today as scheduled.  Restaging CTs prior to his next visit.  We reviewed the CBC and chemistry panel from today.  Labs adequate to proceed with treatment.  He will return for lab, follow-up, treatment in 2 weeks.  He will contact the office in the interim with any problems.    Ned Card ANP/GNP-BC   04/14/2020  9:40 AM

## 2020-04-15 ENCOUNTER — Telehealth: Payer: Self-pay | Admitting: Oncology

## 2020-04-15 NOTE — Telephone Encounter (Signed)
Scheduled appointments per 11/3 los. Spoke to patient who is aware of appointment date and time.

## 2020-04-16 ENCOUNTER — Other Ambulatory Visit: Payer: Self-pay

## 2020-04-16 ENCOUNTER — Inpatient Hospital Stay: Payer: Medicare PPO

## 2020-04-16 VITALS — BP 137/62 | HR 65 | Temp 98.0°F | Resp 18

## 2020-04-16 DIAGNOSIS — Z79899 Other long term (current) drug therapy: Secondary | ICD-10-CM | POA: Diagnosis not present

## 2020-04-16 DIAGNOSIS — Z5112 Encounter for antineoplastic immunotherapy: Secondary | ICD-10-CM | POA: Diagnosis not present

## 2020-04-16 DIAGNOSIS — C180 Malignant neoplasm of cecum: Secondary | ICD-10-CM

## 2020-04-16 DIAGNOSIS — Z5111 Encounter for antineoplastic chemotherapy: Secondary | ICD-10-CM | POA: Diagnosis not present

## 2020-04-16 MED ORDER — SODIUM CHLORIDE 0.9% FLUSH
10.0000 mL | INTRAVENOUS | Status: DC | PRN
  Administered 2020-04-16: 10 mL
  Filled 2020-04-16: qty 10

## 2020-04-16 MED ORDER — HEPARIN SOD (PORK) LOCK FLUSH 100 UNIT/ML IV SOLN
500.0000 [IU] | Freq: Once | INTRAVENOUS | Status: AC | PRN
  Administered 2020-04-16: 500 [IU]
  Filled 2020-04-16: qty 5

## 2020-04-25 ENCOUNTER — Other Ambulatory Visit: Payer: Self-pay | Admitting: Oncology

## 2020-04-26 ENCOUNTER — Ambulatory Visit (HOSPITAL_COMMUNITY)
Admission: RE | Admit: 2020-04-26 | Discharge: 2020-04-26 | Disposition: A | Discharge status: Home / Self Care | Payer: Medicare PPO | Source: Ambulatory Visit | Attending: Nurse Practitioner | Admitting: Nurse Practitioner

## 2020-04-26 ENCOUNTER — Other Ambulatory Visit: Payer: Self-pay

## 2020-04-26 ENCOUNTER — Encounter (HOSPITAL_COMMUNITY): Payer: Self-pay

## 2020-04-26 DIAGNOSIS — C180 Malignant neoplasm of cecum: Secondary | ICD-10-CM | POA: Diagnosis not present

## 2020-04-26 DIAGNOSIS — K4020 Bilateral inguinal hernia, without obstruction or gangrene, not specified as recurrent: Secondary | ICD-10-CM | POA: Diagnosis not present

## 2020-04-26 DIAGNOSIS — D1771 Benign lipomatous neoplasm of kidney: Secondary | ICD-10-CM | POA: Diagnosis not present

## 2020-04-26 DIAGNOSIS — K5730 Diverticulosis of large intestine without perforation or abscess without bleeding: Secondary | ICD-10-CM | POA: Diagnosis not present

## 2020-04-26 DIAGNOSIS — Z8546 Personal history of malignant neoplasm of prostate: Secondary | ICD-10-CM | POA: Diagnosis not present

## 2020-04-26 MED ORDER — IOHEXOL 300 MG/ML  SOLN
100.0000 mL | Freq: Once | INTRAMUSCULAR | Status: AC | PRN
  Administered 2020-04-26: 100 mL via INTRAVENOUS

## 2020-04-27 ENCOUNTER — Other Ambulatory Visit: Payer: Self-pay | Admitting: Nurse Practitioner

## 2020-04-27 DIAGNOSIS — C180 Malignant neoplasm of cecum: Secondary | ICD-10-CM

## 2020-04-28 ENCOUNTER — Ambulatory Visit: Payer: Medicare PPO | Admitting: Pulmonary Disease

## 2020-04-28 ENCOUNTER — Other Ambulatory Visit: Payer: Self-pay

## 2020-04-28 ENCOUNTER — Encounter: Payer: Self-pay | Admitting: Pulmonary Disease

## 2020-04-28 VITALS — BP 130/62 | HR 71 | Temp 97.1°F | Ht 71.0 in | Wt 241.2 lb

## 2020-04-28 DIAGNOSIS — J4520 Mild intermittent asthma, uncomplicated: Secondary | ICD-10-CM

## 2020-04-28 DIAGNOSIS — R0600 Dyspnea, unspecified: Secondary | ICD-10-CM | POA: Diagnosis not present

## 2020-04-28 DIAGNOSIS — R0609 Other forms of dyspnea: Secondary | ICD-10-CM

## 2020-04-28 MED ORDER — BREO ELLIPTA 200-25 MCG/INH IN AEPB: 1.0000 | INHALATION_SPRAY | Freq: Every day | RESPIRATORY_TRACT | 11 refills | Status: DC

## 2020-04-28 NOTE — Patient Instructions (Signed)
Nice to see you gain!  Use the breo you have twice a day until you run out  Then use the new higher dose prescription of breo once a day thereafter  Send me a message and let me know how the breathing is doing in 6 weeks.  Come back for follow up with Dr. Silas Flood in 3 months

## 2020-04-29 ENCOUNTER — Inpatient Hospital Stay: Payer: Medicare PPO

## 2020-04-29 ENCOUNTER — Other Ambulatory Visit: Payer: Self-pay

## 2020-04-29 ENCOUNTER — Inpatient Hospital Stay (HOSPITAL_BASED_OUTPATIENT_CLINIC_OR_DEPARTMENT_OTHER): Payer: Medicare PPO | Admitting: Oncology

## 2020-04-29 VITALS — BP 129/61 | HR 68 | Temp 97.0°F | Resp 16 | Ht 71.0 in | Wt 240.4 lb

## 2020-04-29 DIAGNOSIS — C180 Malignant neoplasm of cecum: Secondary | ICD-10-CM | POA: Diagnosis not present

## 2020-04-29 DIAGNOSIS — Z5111 Encounter for antineoplastic chemotherapy: Secondary | ICD-10-CM | POA: Diagnosis not present

## 2020-04-29 DIAGNOSIS — Z5112 Encounter for antineoplastic immunotherapy: Secondary | ICD-10-CM | POA: Diagnosis not present

## 2020-04-29 DIAGNOSIS — Z95828 Presence of other vascular implants and grafts: Secondary | ICD-10-CM

## 2020-04-29 DIAGNOSIS — Z79899 Other long term (current) drug therapy: Secondary | ICD-10-CM | POA: Diagnosis not present

## 2020-04-29 LAB — CBC WITH DIFFERENTIAL (CANCER CENTER ONLY)
Abs Immature Granulocytes: 0.02 10*3/uL (ref 0.00–0.07)
Basophils Absolute: 0 10*3/uL (ref 0.0–0.1)
Basophils Relative: 1 %
Eosinophils Absolute: 0.7 10*3/uL — ABNORMAL HIGH (ref 0.0–0.5)
Eosinophils Relative: 14 %
HCT: 32.5 % — ABNORMAL LOW (ref 39.0–52.0)
Hemoglobin: 10.6 g/dL — ABNORMAL LOW (ref 13.0–17.0)
Immature Granulocytes: 0 %
Lymphocytes Relative: 8 %
Lymphs Abs: 0.4 10*3/uL — ABNORMAL LOW (ref 0.7–4.0)
MCH: 30.7 pg (ref 26.0–34.0)
MCHC: 32.6 g/dL (ref 30.0–36.0)
MCV: 94.2 fL (ref 80.0–100.0)
Monocytes Absolute: 0.6 10*3/uL (ref 0.1–1.0)
Monocytes Relative: 12 %
Neutro Abs: 3.1 10*3/uL (ref 1.7–7.7)
Neutrophils Relative %: 65 %
Platelet Count: 127 10*3/uL — ABNORMAL LOW (ref 150–400)
RBC: 3.45 MIL/uL — ABNORMAL LOW (ref 4.22–5.81)
RDW: 18.3 % — ABNORMAL HIGH (ref 11.5–15.5)
WBC Count: 4.8 10*3/uL (ref 4.0–10.5)
nRBC: 0 % (ref 0.0–0.2)

## 2020-04-29 LAB — CMP (CANCER CENTER ONLY)
ALT: 38 U/L (ref 0–44)
AST: 45 U/L — ABNORMAL HIGH (ref 15–41)
Albumin: 3.5 g/dL (ref 3.5–5.0)
Alkaline Phosphatase: 68 U/L (ref 38–126)
Anion gap: 5 (ref 5–15)
BUN: 14 mg/dL (ref 8–23)
CO2: 26 mmol/L (ref 22–32)
Calcium: 8.6 mg/dL — ABNORMAL LOW (ref 8.9–10.3)
Chloride: 108 mmol/L (ref 98–111)
Creatinine: 0.83 mg/dL (ref 0.61–1.24)
GFR, Estimated: >60 mL/min (ref >60)
Glucose, Bld: 104 mg/dL — ABNORMAL HIGH (ref 70–99)
Potassium: 4.4 mmol/L (ref 3.5–5.1)
Sodium: 139 mmol/L (ref 135–145)
Total Bilirubin: 0.5 mg/dL (ref 0.3–1.2)
Total Protein: 6 g/dL — ABNORMAL LOW (ref 6.5–8.1)

## 2020-04-29 LAB — MAGNESIUM: Magnesium: 1.9 mg/dL (ref 1.7–2.4)

## 2020-04-29 MED ORDER — SODIUM CHLORIDE 0.9% FLUSH
10.0000 mL | INTRAVENOUS | Status: DC | PRN
  Administered 2020-04-29: 10 mL
  Filled 2020-04-29: qty 10

## 2020-04-29 MED ORDER — SODIUM CHLORIDE 0.9 % IV SOLN
3.0000 mg/kg | Freq: Once | INTRAVENOUS | Status: AC
  Administered 2020-04-29: 300 mg via INTRAVENOUS
  Filled 2020-04-29: qty 15

## 2020-04-29 MED ORDER — SODIUM CHLORIDE 0.9 % IV SOLN
175.0000 mg/m2 | Freq: Once | INTRAVENOUS | Status: AC
  Administered 2020-04-29: 400 mg via INTRAVENOUS
  Filled 2020-04-29: qty 5

## 2020-04-29 MED ORDER — ATROPINE SULFATE 1 MG/ML IJ SOLN
0.5000 mg | Freq: Once | INTRAMUSCULAR | Status: AC | PRN
  Administered 2020-04-29: 0.5 mg via INTRAVENOUS

## 2020-04-29 MED ORDER — PALONOSETRON HCL INJECTION 0.25 MG/5ML
0.2500 mg | Freq: Once | INTRAVENOUS | Status: AC
  Administered 2020-04-29: 0.25 mg via INTRAVENOUS

## 2020-04-29 MED ORDER — SODIUM CHLORIDE 0.9 % IV SOLN
2000.0000 mg/m2 | INTRAVENOUS | Status: DC
  Administered 2020-04-29: 4650 mg via INTRAVENOUS
  Filled 2020-04-29: qty 93

## 2020-04-29 MED ORDER — ATROPINE SULFATE 1 MG/ML IJ SOLN
INTRAMUSCULAR | Status: AC
  Filled 2020-04-29: qty 1

## 2020-04-29 MED ORDER — SODIUM CHLORIDE 0.9 % IV SOLN
10.0000 mg | Freq: Once | INTRAVENOUS | Status: AC
  Administered 2020-04-29: 10 mg via INTRAVENOUS
  Filled 2020-04-29: qty 10

## 2020-04-29 MED ORDER — SODIUM CHLORIDE 0.9 % IV SOLN
Freq: Once | INTRAVENOUS | Status: AC
  Filled 2020-04-29: qty 250

## 2020-04-29 MED ORDER — PALONOSETRON HCL INJECTION 0.25 MG/5ML
INTRAVENOUS | Status: AC
  Filled 2020-04-29: qty 5

## 2020-04-29 MED ORDER — SODIUM CHLORIDE 0.9 % IV SOLN
400.0000 mg/m2 | Freq: Once | INTRAVENOUS | Status: AC
  Administered 2020-04-29: 928 mg via INTRAVENOUS
  Filled 2020-04-29: qty 46.4

## 2020-04-29 NOTE — Progress Notes (Signed)
@Patient  ID: Luis [name], male    DOB: Jan 04, 1941, 79 y.o.   MRN: [id number]  Chief Complaint  Patient presents with  . Follow-up    productive cough with clear to light green phlegm    Referring provider: [name], MD  HPI:   Luis [name] is a 79 year old man with past medical history significant for sarcoidosis treated for years of prednisone currently in remission, significant coronary artery disease status post multiple drug-eluting stents, currently on chemotherapy for adenocarcinoma of the cecum whom we are seeing in follow-up for dyspnea on exertion.  Adherent to the mid dose Breo.  He feels like this is helped his breathing.  He endorses lingering cough.  Feels like it is congestion in his chest.  Sometimes brings up phlegm often dry.  Review of systems does endorse mild hoarseness of voice.  Discussed this is likely due to the inhaled steroid.  Discussed the option of increasing Breo to see if it helps with his ongoing dyspnea and cough with the anticipated side effect of possible worsening hoarseness.  Also discussed we could keep Breo dose the same and add montelukast to see if it helps with the cough.  We agreed to increase Breo and he will be [name] consistent and rinsing out his mouth with water after every use as it seems he is slacked off a bit on this.  Hopefully this will improve or minimize worsening hoarseness.  HPI at initial visit: Overall, disorders are largely unchanged.  Formally followed with [name] for sarcoidosis.  This has been quiescent for some time.  Most recent CT scan reviewed which reveals on my interpretation stable bilateral fibrosis, scattered calcified granulomas in the lung as well as significant calcified hilar and mediastinal lymphadenopathy and sequela of prior lung surgery.  He has been seen in clinic recently by [name].  Primary complaint was dyspnea on exertion as well as cough.  These persist.  Chest imaging obtained as above.   PFTs obtained last week which reveal suggestion of mild to moderate restriction on spirometry without significant bronchodilator response.  Moderate restriction confirmed with total lung capacity 69% of predicted.  DLCO corrects to within normal limits with adjustment for hemoglobin.  Cough and shortness of breath coincide with worsening nasal congestion.  Postnasal drip.  Not really using meds to address this this.  He is tolerating chemotherapy well.  Review of labs indicate stable hemoglobin without concern for symptomatic anemia at this time.   Questionaires / Pulmonary Flowsheets:   ACT:  No flowsheet data found.  MMRC: No flowsheet data found.  Epworth:  No flowsheet data found.  Tests:   FENO:  No results found for: NITRICOXIDE  PFT: PFT Results Latest Ref Rng & Units 03/10/2020 08/30/2017  FVC-Pre L 2.94 3.14  FVC-Predicted Pre % 68 72  FVC-Post L 2.90 3.18  FVC-Predicted Post % 67 73  Pre FEV1/FVC % % 66 70  Post FEV1/FCV % % 68 73  FEV1-Pre L 1.93 2.19  FEV1-Predicted Pre % 62 69  FEV1-Post L 1.96 2.31  DLCO uncorrected ml/min/mmHg 19.10 17.73  DLCO UNC% % 75 52  DLCO corrected ml/min/mmHg 21.40 20.68  DLCO COR %Predicted % 84 61  DLVA Predicted % 118 98  TLC L 5.02 4.99  TLC % Predicted % 69 68  RV % Predicted % 76 71    WALK:  SIX MIN WALK 08/28/2017  Supplimental Oxygen during Test? (L/min) No  Tech Comments: pt walked a fast pace,  tolerated walk well.     Imaging: Personally reviewed and as per EMR and discussion in this note  CT Abdomen Pelvis W Contrast  Result Date: 04/26/2020 CLINICAL DATA:  Cecal cancer. Right colectomy 07/26/2017. Ongoing chemotherapy. Known hepatic metastatic lesions. History prostate cancer with brachytherapy seeds placed in 2014. EXAM: CT ABDOMEN AND PELVIS WITH CONTRAST TECHNIQUE: Multidetector CT imaging of the abdomen and pelvis was performed using the standard protocol following bolus administration of intravenous  contrast. CONTRAST:  169mL OMNIPAQUE IOHEXOL 300 MG/ML  SOLN COMPARISON:  02/06/2020 FINDINGS: Lower chest: Stable scarring particularly at the right lung base. Stable old granulomatous disease in the lung bases. Borderline cardiomegaly. Hepatobiliary: Currently 3 discrete hepatic lesions are identified. The segment 2 lesion is sharply defined and probably a cyst. Laterally in the right hepatic lobe, and ill-defined hypodense lesion measuring 3.7 by 2.8 cm on image 20 of series 2 previously measured 3.3 by 2.3 cm by my measurements. Accordingly this has mildly enlarged. An ill-defined hypodense lesion along the inferior margin of the right hepatic lobe measures 4.2 by 2.8 cm on image 29 of series 2, previously 3.8 by 2.9 cm by my measurements. Accordingly this lesion is stable to minimally enlarged. No new lesion is identified.  Cholecystectomy noted. Pancreas: Unremarkable Spleen: Unremarkable Adrenals/Urinary Tract: Both adrenal glands appear normal. 0.8 cm angiomyolipoma of the right kidney upper pole, image 31 series 2, stable. Stomach/Bowel: Small periampullary duodenal diverticulum. Right hemicolectomy. Sigmoid colon diverticulosis. Scattered diverticula of the descending colon. Vascular/Lymphatic: Aortoiliac atherosclerotic vascular disease. No pathologic adenopathy is identified. Reproductive: Stable small size of the prostate gland with brachytherapy seeds in place. Other: No supplemental non-categorized findings. Musculoskeletal: Small bilateral direct inguinal hernias contain adipose tissue. Bridging spurring of the sacroiliac joints. Lumbar spondylosis and degenerative disc disease cause multilevel impingement, especially at L4-5. IMPRESSION: 1. Mild enlargement of the two hypodense lesions in the right hepatic lobe compatible with progressive malignancy. 2. Other imaging findings of potential clinical significance: Borderline cardiomegaly. Stable old granulomatous disease in the lung bases. Sigmoid  colon diverticulosis. Small bilateral direct inguinal hernias contain adipose tissue. Lumbar spondylosis and degenerative disc disease causing multilevel impingement, especially at L4-5. Stable small angiomyolipoma of the right kidney upper pole. 3. Aortic atherosclerosis. Aortic Atherosclerosis (ICD10-I70.0). Electronically Signed   By: Van Clines M.D.   On: 04/26/2020 14:16    Lab Results: Personally reviewed, eosinophils routinely 200-300 CBC    Component Value Date/Time   WBC 5.8 04/14/2020 0856   WBC 6.4 03/04/2019 1100   RBC 3.52 (L) 04/14/2020 0856   HGB 10.8 (L) 04/14/2020 0856   HGB 15.5 12/25/2018 1306   HCT 33.1 (L) 04/14/2020 0856   HCT 45.7 12/25/2018 1306   PLT 197 04/14/2020 0856   PLT 142 (L) 12/25/2018 1306   MCV 94.0 04/14/2020 0856   MCV 92 12/25/2018 1306   MCH 30.7 04/14/2020 0856   MCHC 32.6 04/14/2020 0856   RDW 17.3 (H) 04/14/2020 0856   RDW 14.4 12/25/2018 1306   LYMPHSABS 0.4 (L) 04/14/2020 0856   MONOABS 0.9 04/14/2020 0856   EOSABS 0.5 04/14/2020 0856   BASOSABS 0.0 04/14/2020 0856    BMET    Component Value Date/Time   NA 138 04/14/2020 0856   NA 138 12/25/2018 1306   K 4.2 04/14/2020 0856   CL 105 04/14/2020 0856   CO2 27 04/14/2020 0856   GLUCOSE 101 (H) 04/14/2020 0856   BUN 15 04/14/2020 0856   BUN 15 12/25/2018 1306   CREATININE  0.77 04/14/2020 0856   CREATININE 1.02 05/16/2016 0937   CALCIUM 8.8 (L) 04/14/2020 0856   GFRNONAA >60 04/14/2020 0856   GFRAA >60 03/10/2020 0855    BNP    Component Value Date/Time   BNP 43.4 05/16/2016 0937    ProBNP    Component Value Date/Time   PROBNP 60.0 08/10/2014 1052    Specialty Problems      Pulmonary Problems   Pulmonary fibrosis (HCC)   DOE (dyspnea on exertion)      No Known Allergies  Immunization History  Administered Date(s) Administered  . DTaP / IPV 09/25/2012  . Fluad Quad(high Dose 65+) 02/19/2019  . Influenza Split 03/27/2014  . Influenza, High Dose  Seasonal PF 03/27/2014, 01/27/2017, 02/22/2018  . Influenza-Unspecified 02/24/2017, 03/10/2020  . PFIZER SARS-COV-2 Vaccination 07/11/2019, 07/21/2019, 01/26/2020  . Pneumococcal Conjugate-13 03/27/2014  . Pneumococcal Polysaccharide-23 11/10/2009  . Pneumococcal-Unspecified 03/12/2014  . Tdap 03/24/2011, 09/25/2012  . Zoster 03/21/2010  . Zoster Recombinat (Shingrix) 09/01/2018, 02/19/2019    Past Medical History:  Diagnosis Date  . Adenocarcinoma of cecum ([place]) 06/21/2017  . Arthritis    "mid back; hands; knees" (02/24/2015)  . Basal cell carcinoma    left shoulder; mid chest; right eyelid (02/24/2015)  . CAD (coronary artery disease)    a. 08/2014 Cath/PCI: LM nl, LAD 30p, D1 95 (2.25x12 Resolute Integrity DES), LCX small, RI 100 (attempted PCI) - branches fill via L->L collats, RCA dominant, nl, RPDA/PLA nl, EF 60^. b. 02/24/2015 PCI CTO of Ramus DES x2.  . Chronic bronchitis (HCC)    hx  . Diverticulosis 05/2005  . Elevated lipase   . Fuchs' corneal dystrophy   . History of adenomatous polyp of colon 05/2005   8 mm adenoma  . History of blood transfusion    "related to some of my surgeries"  . Hyperlipidemia   . Hypertension   . Iron deficiency anemia due to chronic blood loss 06/21/2017  . Prostate cancer ([place]) 2011   S/P seed implant  . Sarcoidosis   . Thrombocytopenia ([place])    a. Noted on prior labs, unclear of what w/u done.  . Trifascicular block     Tobacco History: Social History   Tobacco Use  Smoking Status Light Tobacco Smoker  . Packs/day: 0.00  . Years: 59.00  . Pack years: 0.00  . Types: Cigars  Smokeless Tobacco Never Used  Tobacco Comment   02/24/2015 "quit cigarettes ~ 2000 ago but smokes cigars ionce/month"   Ready to quit: Not Answered Counseling given: Not Answered Comment: 02/24/2015 "quit cigarettes ~ 2000 ago but smokes cigars ionce/month"   Continue to not smoke  Outpatient Encounter Medications as of 04/28/2020  Medication Sig  .  amLODipine (NORVASC) 5 MG tablet TAKE 1 TABLET BY MOUTH TWICE A DAY  . CANNABIDIOL PO Take 12 drops by mouth 2 (two) times a day. CBD OIL  . Cholecalciferol (VITAMIN D3) 2000 units capsule Take 2,000 Units by mouth every evening.   . clopidogrel (PLAVIX) 75 MG tablet TAKE 1 TABLET (75 MG TOTAL) BY MOUTH DAILY WITH BREAKFAST.  [name] doxycycline (VIBRA-TABS) 100 MG tablet TAKE 1 TABLET BY MOUTH TWICE A DAY  . fluticasone (CUTIVATE) 0.05 % cream APPLY TO AFFECTED AREAS ON FACE 2 TIMES DAILY  . gabapentin (NEURONTIN) 100 MG capsule Take 2 capsules (200 mg total) by mouth at bedtime.  . magnesium oxide (MAG-OX) 400 MG tablet Take 400 mg by mouth daily.   . metoprolol tartrate (LOPRESSOR) 25 MG tablet TAKE  1/2 TABLET BY MOUTH TWICE A DAY  . nitroGLYCERIN (NITROSTAT) 0.4 MG SL tablet PLACE 1 TABLET UNDER THE TONGUE EVERY 5 MINUTES AS NEEDED FOR CHEST PAIN FOR 3 DOSES  . Polyethylene Glycol 3350 (MIRALAX PO) Take 17 g by mouth daily as needed.  . prochlorperazine (COMPAZINE) 10 MG tablet Take 1 tablet (10 mg total) by mouth every 6 (six) hours as needed.  . ranolazine (RANEXA) 500 MG 12 hr tablet Take 1 tablet (500 mg total) by mouth 2 (two) times daily.  . rosuvastatin (CRESTOR) 20 MG tablet TAKE 1 TABLET BY MOUTH EVERY DAY  . traMADol (ULTRAM) 50 MG tablet Take 100 mg by mouth 2 (two) times a day.   . [DISCONTINUED] fluticasone furoate-vilanterol (BREO ELLIPTA) 100-25 MCG/INH AEPB Inhale 1 puff into the lungs daily.  . fluticasone furoate-vilanterol (BREO ELLIPTA) 200-25 MCG/INH AEPB Inhale 1 puff into the lungs daily.  . [DISCONTINUED] fluticasone furoate-vilanterol (BREO ELLIPTA) 200-25 MCG/INH AEPB Inhale 1 puff into the lungs daily.   No facility-administered encounter medications on file as of 04/28/2020.     Review of Systems  N/a  Physical Exam  BP 130/62 (BP Location: Right Arm, Cuff Size: Normal)   Pulse 71   Temp (!) 97.1 F (36.2 C) (Other (Comment)) Comment (Src): wrist  Ht 5\' 11"   (1.803 m)   Wt 241 lb 3.2 oz (109.4 kg)   SpO2 98% Comment: room air  BMI 33.64 kg/m   Wt Readings from Last 5 Encounters:  04/28/20 241 lb 3.2 oz (109.4 kg)  04/14/20 240 lb 6.4 oz (109 kg)  03/25/20 241 lb 6.4 oz (109.5 kg)  03/17/20 238 lb 3.2 oz (108 kg)  03/10/20 237 lb (107.5 kg)    BMI Readings from Last 5 Encounters:  04/28/20 33.64 kg/m  04/14/20 34.49 kg/m  03/25/20 34.64 kg/m  03/17/20 33.70 kg/m  03/10/20 33.05 kg/m     Physical Exam General: Well-appearing, no acute distress Eyes: EOMI, no icterus Neck: No JVD supple Respiratory:L clear to auscultation bilaterally, no wheezes or crackles Cardiovascular: Regular rhythm, no murmurs Abdomen: Nondistended, bowel sounds present Psych: Normal mood, full affect   Assessment & Plan:   Dyspnea on exertion: Imaging and PFTs reviewed.  Suspect is multifactorial.  PFTs on my interpretation reveal moderate restriction likely sequela of burned-out sarcoid in addition to prior lung resection when this was diagnosed many years ago.  No evidence of active pulmonary sarcoid on most recent imaging.  No evidence of any pneumonitis from chemotherapy regimen.  He endorses nasal congestion that coincides with worsening dyspnea.  Raises high suspicion for asthma.  Notably, he has had eosinophil levels as high as 300 as well. Empirically treated with mid dose Breo and DOE has improved.   Asthma: Presumed diagnosis given atopic symptoms and dyspnea on exertion. Improving.  Increase Breo to high dose.  If not adequately improved in the coming weeks anticipate adding montelukast.    Cough: Seasonal allergies.  Phlegm from asthma. Addressed above.  Return in about 3 months (around 07/29/2020).   Lanier Clam, MD 04/29/2020

## 2020-04-29 NOTE — Progress Notes (Signed)
[place] OFFICE PROGRESS NOTE   Diagnosis: Colon cancer  INTERVAL HISTORY:   Luis [name] completed another cycle of FOLFIRI/Panitumumab on 04/14/2020.  No mouth sores or diarrhea.  Stable skin rash.  Good appetite.  He is working.  He continues follow-up with pulmonary medicine for the exertional dyspnea.  He is using an inhaler.  Objective:  Vital signs in last 24 hours:  Blood pressure 129/61, pulse 68, temperature (!) 97 F (36.1 C), temperature source Tympanic, resp. rate 16, height $RemoveBe'5\' 11"'hshWiOfUj$  (1.803 m), weight 240 lb 6.4 oz (109 kg), SpO2 100 %.    HEENT: No thrush or ulcers Resp: Lungs clear bilaterally Cardio: Regular rate and rhythm GI: No hepatosplenomegaly, nontender Vascular: No leg edema  Skin: Pustular rash over the trunk, palms without erythema  Portacath/PICC-without erythema  Lab Results:  Lab Results  Component Value Date   WBC 4.8 04/29/2020   HGB 10.6 (L) 04/29/2020   HCT 32.5 (L) 04/29/2020   MCV 94.2 04/29/2020   PLT 127 (L) 04/29/2020   NEUTROABS 3.1 04/29/2020    CMP  Lab Results  Component Value Date   NA 139 04/29/2020   K 4.4 04/29/2020   CL 108 04/29/2020   CO2 26 04/29/2020   GLUCOSE 104 (H) 04/29/2020   BUN 14 04/29/2020   CREATININE 0.83 04/29/2020   CALCIUM 8.6 (L) 04/29/2020   PROT 6.0 (L) 04/29/2020   ALBUMIN 3.5 04/29/2020   AST 45 (H) 04/29/2020   ALT 38 04/29/2020   ALKPHOS 68 04/29/2020   BILITOT 0.5 04/29/2020   GFRNONAA >60 04/29/2020   GFRAA >60 03/10/2020    Lab Results  Component Value Date   CEA1 2.10 04/14/2020    Lab Results  Component Value Date   INR 0.9 03/04/2019    Imaging:  CT Abdomen Pelvis W Contrast  Result Date: 04/26/2020 CLINICAL DATA:  Cecal cancer. Right colectomy 07/26/2017. Ongoing chemotherapy. Known hepatic metastatic lesions. History prostate cancer with brachytherapy seeds placed in 2014. EXAM: CT ABDOMEN AND PELVIS WITH CONTRAST TECHNIQUE: Multidetector CT imaging  of the abdomen and pelvis was performed using the standard protocol following bolus administration of intravenous contrast. CONTRAST:  128mL OMNIPAQUE IOHEXOL 300 MG/ML  SOLN COMPARISON:  02/06/2020 FINDINGS: Lower chest: Stable scarring particularly at the right lung base. Stable old granulomatous disease in the lung bases. Borderline cardiomegaly. Hepatobiliary: Currently 3 discrete hepatic lesions are identified. The segment 2 lesion is sharply defined and probably a cyst. Laterally in the right hepatic lobe, and ill-defined hypodense lesion measuring 3.7 by 2.8 cm on image 20 of series 2 previously measured 3.3 by 2.3 cm by my measurements. Accordingly this has mildly enlarged. An ill-defined hypodense lesion along the inferior margin of the right hepatic lobe measures 4.2 by 2.8 cm on image 29 of series 2, previously 3.8 by 2.9 cm by my measurements. Accordingly this lesion is stable to minimally enlarged. No new lesion is identified.  Cholecystectomy noted. Pancreas: Unremarkable Spleen: Unremarkable Adrenals/Urinary Tract: Both adrenal glands appear normal. 0.8 cm angiomyolipoma of the right kidney upper pole, image 31 series 2, stable. Stomach/Bowel: Small periampullary duodenal diverticulum. Right hemicolectomy. Sigmoid colon diverticulosis. Scattered diverticula of the descending colon. Vascular/Lymphatic: Aortoiliac atherosclerotic vascular disease. No pathologic adenopathy is identified. Reproductive: Stable small size of the prostate gland with brachytherapy seeds in place. Other: No supplemental non-categorized findings. Musculoskeletal: Small bilateral direct inguinal hernias contain adipose tissue. Bridging spurring of the sacroiliac joints. Lumbar spondylosis and degenerative disc disease cause multilevel impingement,  especially at L4-5. IMPRESSION: 1. Mild enlargement of the two hypodense lesions in the right hepatic lobe compatible with progressive malignancy. 2. Other imaging findings of  potential clinical significance: Borderline cardiomegaly. Stable old granulomatous disease in the lung bases. Sigmoid colon diverticulosis. Small bilateral direct inguinal hernias contain adipose tissue. Lumbar spondylosis and degenerative disc disease causing multilevel impingement, especially at L4-5. Stable small angiomyolipoma of the right kidney upper pole. 3. Aortic atherosclerosis. Aortic Atherosclerosis (ICD10-I70.0). Electronically Signed   By: Van Clines M.D.   On: 04/26/2020 14:16    Medications: I have reviewed the patient's current medications.   Assessment/Plan: 1. Adenocarcinoma of the cecum, stage IIA(T3N0), status post a right colectomy 07/26/2017 ? 0/16 lymph nodes positive, no lymphovascular invasion, perineural invasion present ? MSI-stable, no loss of mismatch repair protein expression ? Surveillance colonoscopy 07/26/2018-no polyps found ? Elevated CEA June 2020, persistently elevated August 2020 ? CTs 02/07/2019-bilateral liver metastases, no extrahepatic metastatic disease, changes of sarcoidosis in the chest ? Biopsy liver lesion 03/04/2019-metastatic adenocarcinoma, with morphology consistent with metastatic colonic adenocarcinoma, RAS WT ? Cycle 1 FOLFOX 03/12/2019 ? Cycle 2 FOLFOX 03/26/2019, oxaliplatin dose reduced secondary to thrombocytopenia ? Cycle 3 FOLFOX 04/09/2019, oxaliplatin further dose reduced and aspirin held secondary to thrombocytopenia ? Cycle 4 FOLFOX 04/23/2019 ? Cycle 5 FOLFOX 05/07/2019 ? CTs 05/23/2019-decrease in liver lesions. No new or progressive findings. ? Cycle 6 FOLFOX 05/27/2019 ? Cycle 7 FOLFOX 06/14/2019 ? Cycle 8 FOLFOX 07/02/2019-Udenyca held ? Cycle 9 FOLFOX 07/16/2019-Udenyca held ? Cycle 10 FOLFOX 07/30/2019-Udenyca held ? CT abdomen/pelvis 08/13/2019-mild improvement in previously noted liver lesions ? MRI abdomen 08/13/2019-3 liver lesions ? Cycle 1 FOLFIRI/Avastin 09/17/2019 ? Cycle 2 FOLFIRI/Avastin 10/01/2019 ? Cycle 3  FOLFIRI/Avastin 10/15/2019 ? Cycle 4 FOLFIRI/Avastin 11/05/2019 ? Cycle 5 FOLFIRI/Avastin 11/19/2019 ? CTs 11/27/2019-stable liver lesions; no new liver lesions. No other evidence of metastatic disease in the abdomen or pelvis. ? Cycle 1 FOLFIRI/Panitumumab 12/03/2019 ? Cycle 2 FOLFIRI/Panitumumab 12/17/2019 (Panitumumab dose reduced due to rash) ? Cycle 3 FOLFIRI/Panitumumab 01/01/2020 ? Cycle 4 FOLFIRI/Panitumumab 01/14/2020 ? Cycle 5 FOLFIRI/Panitumumab 01/28/2020 ? CT abdomen/pelvis 02/06/2020-decreased size of right hepatic lesions, subtle lesion in the lateral left hepatic lobe-not clearly identified, no evidence of disease progression ? Cycle 6 FOLFIRI/Panitumumab 02/11/2020 ? Cycle 7 FOLFIRI/Panitumumab 03/10/2020 ? Cycle 8 FOLFIRI/Panitumumab 03/26/2020 ? Cycle 9 FOLFIRI/Panitumumab 04/14/2020 ? CT abdomen/pelvis 04/26/2020-mild enlargement of 2 liver lesions on initial report, repeat review with radiology consistent with stable disease ? Cycle 10 FOLFIRI/Panitumumab 04/29/2020 2. History of colon polyps 3. Microcytic anemia January 2019 4. Sarcoidosis 5. Coronary artery disease 6. Port-A-Cath placement, interventional radiology, 03/04/2019 7. Thrombocytopenia, mild thrombocytopenia predating chemotherapy 8. Oxaliplatin neuropathy 9. Dyspnea on exertion-CT chest 02/25/2020 negative for PE, infection, pneumonitis; scheduled for PFTs 03/10/2020, followed by pulmonary.      Disposition: Luis [name] appears stable.  He has been maintained on FOLFIRI/Panitumumab since June.  I reviewed the CT images with Luis [name] and a radiologist.  The liver lesions do not appear significantly changed.  Only 2 lesions are identified.  He appears to be a candidate for ablation.  We decided to continue FOLFIRI/Panitumumab.  He will be referred for an MRI of the liver.  I will present his case at the GI tumor conference within the next few weeks to discuss options for hepatic directed therapy.  Luis [name] will return  for an office visit with the plan to continue FOLFIRI/Panitumumab on 05/19/2020.  Betsy Coder, MD  04/29/2020  11:35 AM

## 2020-04-29 NOTE — Patient Instructions (Signed)
[place] [facility] [facility] Discharge Instructions for Patients Receiving Chemotherapy  Today you received the following chemotherapy agents panitumab, irinotecan, leucovorin, fluorouracil.  To help prevent nausea and vomiting after your treatment, we encourage you to take your nausea medication as directed.    If you develop nausea and vomiting that is not controlled by your nausea medication, call the clinic.   BELOW ARE SYMPTOMS THAT SHOULD BE REPORTED IMMEDIATELY:  *FEVER GREATER THAN 100.5 F  *CHILLS WITH OR WITHOUT FEVER  NAUSEA AND VOMITING THAT IS NOT CONTROLLED WITH YOUR NAUSEA MEDICATION  *UNUSUAL SHORTNESS OF BREATH  *UNUSUAL BRUISING OR BLEEDING  TENDERNESS IN MOUTH AND THROAT WITH OR WITHOUT PRESENCE OF ULCERS  *URINARY PROBLEMS  *BOWEL PROBLEMS  UNUSUAL RASH Items with * indicate a potential emergency and should be followed up as soon as possible.  Feel free to call the clinic should you have any questions or concerns. The clinic phone number is [phone].  Please show the [facility] at check-in to the Emergency Department and triage nurse.

## 2020-04-29 NOTE — Patient Instructions (Signed)

## 2020-04-30 ENCOUNTER — Telehealth: Payer: Self-pay | Admitting: Oncology

## 2020-04-30 DIAGNOSIS — M48061 Spinal stenosis, lumbar region without neurogenic claudication: Secondary | ICD-10-CM | POA: Diagnosis not present

## 2020-04-30 DIAGNOSIS — Z139 Encounter for screening, unspecified: Secondary | ICD-10-CM | POA: Diagnosis not present

## 2020-04-30 DIAGNOSIS — Z6835 Body mass index (BMI) 35.0-35.9, adult: Secondary | ICD-10-CM | POA: Diagnosis not present

## 2020-04-30 DIAGNOSIS — M5416 Radiculopathy, lumbar region: Secondary | ICD-10-CM | POA: Diagnosis not present

## 2020-04-30 DIAGNOSIS — M545 Low back pain, unspecified: Secondary | ICD-10-CM | POA: Diagnosis not present

## 2020-04-30 DIAGNOSIS — Z79899 Other long term (current) drug therapy: Secondary | ICD-10-CM | POA: Diagnosis not present

## 2020-04-30 NOTE — Telephone Encounter (Signed)
Scheduled appointments per 11/18 los. Spoke to patient who is aware of appointment date and time.

## 2020-05-01 ENCOUNTER — Other Ambulatory Visit: Payer: Self-pay

## 2020-05-01 ENCOUNTER — Inpatient Hospital Stay: Payer: Medicare PPO

## 2020-05-01 VITALS — BP 148/87 | HR 72 | Temp 98.1°F | Resp 20

## 2020-05-01 DIAGNOSIS — Z79899 Other long term (current) drug therapy: Secondary | ICD-10-CM | POA: Diagnosis not present

## 2020-05-01 DIAGNOSIS — C180 Malignant neoplasm of cecum: Secondary | ICD-10-CM | POA: Diagnosis not present

## 2020-05-01 DIAGNOSIS — Z5112 Encounter for antineoplastic immunotherapy: Secondary | ICD-10-CM | POA: Diagnosis not present

## 2020-05-01 DIAGNOSIS — Z5111 Encounter for antineoplastic chemotherapy: Secondary | ICD-10-CM | POA: Diagnosis not present

## 2020-05-01 MED ORDER — SODIUM CHLORIDE 0.9% FLUSH
10.0000 mL | INTRAVENOUS | Status: DC | PRN
  Administered 2020-05-01: 10 mL
  Filled 2020-05-01: qty 10

## 2020-05-01 MED ORDER — HEPARIN SOD (PORK) LOCK FLUSH 100 UNIT/ML IV SOLN
500.0000 [IU] | Freq: Once | INTRAVENOUS | Status: AC | PRN
  Administered 2020-05-01: 500 [IU]
  Filled 2020-05-01: qty 5

## 2020-05-11 DIAGNOSIS — M48061 Spinal stenosis, lumbar region without neurogenic claudication: Secondary | ICD-10-CM | POA: Diagnosis not present

## 2020-05-11 DIAGNOSIS — M545 Low back pain, unspecified: Secondary | ICD-10-CM | POA: Diagnosis not present

## 2020-05-14 ENCOUNTER — Encounter (HOSPITAL_COMMUNITY): Payer: Medicare PPO

## 2020-05-16 ENCOUNTER — Other Ambulatory Visit: Payer: Self-pay | Admitting: Oncology

## 2020-05-17 ENCOUNTER — Other Ambulatory Visit: Payer: Self-pay

## 2020-05-17 ENCOUNTER — Other Ambulatory Visit: Payer: Self-pay | Admitting: Interventional Cardiology

## 2020-05-17 ENCOUNTER — Ambulatory Visit (HOSPITAL_COMMUNITY)
Admission: RE | Admit: 2020-05-17 | Discharge: 2020-05-17 | Disposition: A | Discharge status: Home / Self Care | Payer: Medicare PPO | Source: Ambulatory Visit | Attending: Cardiology | Admitting: Cardiology

## 2020-05-17 DIAGNOSIS — I6523 Occlusion and stenosis of bilateral carotid arteries: Secondary | ICD-10-CM

## 2020-05-19 ENCOUNTER — Ambulatory Visit
Admission: RE | Admit: 2020-05-19 | Discharge: 2020-05-19 | Disposition: A | Discharge status: Home / Self Care | Payer: Medicare PPO | Source: Ambulatory Visit | Attending: Oncology | Admitting: Oncology

## 2020-05-19 ENCOUNTER — Other Ambulatory Visit: Payer: Self-pay

## 2020-05-19 DIAGNOSIS — C189 Malignant neoplasm of colon, unspecified: Secondary | ICD-10-CM | POA: Diagnosis not present

## 2020-05-19 DIAGNOSIS — K7689 Other specified diseases of liver: Secondary | ICD-10-CM | POA: Diagnosis not present

## 2020-05-19 DIAGNOSIS — D179 Benign lipomatous neoplasm, unspecified: Secondary | ICD-10-CM | POA: Diagnosis not present

## 2020-05-19 DIAGNOSIS — C180 Malignant neoplasm of cecum: Secondary | ICD-10-CM

## 2020-05-19 MED ORDER — GADOBENATE DIMEGLUMINE 529 MG/ML IV SOLN
20.0000 mL | Freq: Once | INTRAVENOUS | Status: AC | PRN
  Administered 2020-05-19: 20 mL via INTRAVENOUS

## 2020-05-19 MED FILL — Dexamethasone Sodium Phosphate Inj 100 MG/10ML: INTRAMUSCULAR | Qty: 1 | Status: AC

## 2020-05-20 ENCOUNTER — Inpatient Hospital Stay: Payer: Medicare PPO

## 2020-05-20 ENCOUNTER — Encounter: Payer: Self-pay | Admitting: Nurse Practitioner

## 2020-05-20 ENCOUNTER — Inpatient Hospital Stay (HOSPITAL_BASED_OUTPATIENT_CLINIC_OR_DEPARTMENT_OTHER): Payer: Medicare PPO | Admitting: Nurse Practitioner

## 2020-05-20 ENCOUNTER — Inpatient Hospital Stay: Payer: Medicare PPO | Attending: Nurse Practitioner

## 2020-05-20 ENCOUNTER — Other Ambulatory Visit: Payer: Self-pay

## 2020-05-20 VITALS — BP 129/65 | HR 67 | Temp 98.2°F | Resp 19 | Ht 71.0 in | Wt 239.5 lb

## 2020-05-20 DIAGNOSIS — Z8719 Personal history of other diseases of the digestive system: Secondary | ICD-10-CM | POA: Insufficient documentation

## 2020-05-20 DIAGNOSIS — C180 Malignant neoplasm of cecum: Secondary | ICD-10-CM | POA: Insufficient documentation

## 2020-05-20 DIAGNOSIS — Z95828 Presence of other vascular implants and grafts: Secondary | ICD-10-CM

## 2020-05-20 DIAGNOSIS — G620 Drug-induced polyneuropathy: Secondary | ICD-10-CM | POA: Diagnosis not present

## 2020-05-20 DIAGNOSIS — I2510 Atherosclerotic heart disease of native coronary artery without angina pectoris: Secondary | ICD-10-CM | POA: Insufficient documentation

## 2020-05-20 DIAGNOSIS — C787 Secondary malignant neoplasm of liver and intrahepatic bile duct: Secondary | ICD-10-CM | POA: Insufficient documentation

## 2020-05-20 DIAGNOSIS — D696 Thrombocytopenia, unspecified: Secondary | ICD-10-CM | POA: Insufficient documentation

## 2020-05-20 LAB — CBC WITH DIFFERENTIAL (CANCER CENTER ONLY)
Abs Immature Granulocytes: 0.03 10*3/uL (ref 0.00–0.07)
Basophils Absolute: 0 10*3/uL (ref 0.0–0.1)
Basophils Relative: 1 %
Eosinophils Absolute: 0.8 10*3/uL — ABNORMAL HIGH (ref 0.0–0.5)
Eosinophils Relative: 15 %
HCT: 35.3 % — ABNORMAL LOW (ref 39.0–52.0)
Hemoglobin: 11.4 g/dL — ABNORMAL LOW (ref 13.0–17.0)
Immature Granulocytes: 1 %
Lymphocytes Relative: 11 %
Lymphs Abs: 0.5 10*3/uL — ABNORMAL LOW (ref 0.7–4.0)
MCH: 30.5 pg (ref 26.0–34.0)
MCHC: 32.3 g/dL (ref 30.0–36.0)
MCV: 94.4 fL (ref 80.0–100.0)
Monocytes Absolute: 0.8 10*3/uL (ref 0.1–1.0)
Monocytes Relative: 16 %
Neutro Abs: 2.8 10*3/uL (ref 1.7–7.7)
Neutrophils Relative %: 56 %
Platelet Count: 178 10*3/uL (ref 150–400)
RBC: 3.74 MIL/uL — ABNORMAL LOW (ref 4.22–5.81)
RDW: 18.3 % — ABNORMAL HIGH (ref 11.5–15.5)
WBC Count: 4.9 10*3/uL (ref 4.0–10.5)
nRBC: 0 % (ref 0.0–0.2)

## 2020-05-20 LAB — CMP (CANCER CENTER ONLY)
ALT: 38 U/L (ref 0–44)
AST: 45 U/L — ABNORMAL HIGH (ref 15–41)
Albumin: 3.6 g/dL (ref 3.5–5.0)
Alkaline Phosphatase: 67 U/L (ref 38–126)
Anion gap: 7 (ref 5–15)
BUN: 17 mg/dL (ref 8–23)
CO2: 25 mmol/L (ref 22–32)
Calcium: 9.1 mg/dL (ref 8.9–10.3)
Chloride: 106 mmol/L (ref 98–111)
Creatinine: 0.85 mg/dL (ref 0.61–1.24)
GFR, Estimated: >60 mL/min (ref >60)
Glucose, Bld: 109 mg/dL — ABNORMAL HIGH (ref 70–99)
Potassium: 4.5 mmol/L (ref 3.5–5.1)
Sodium: 138 mmol/L (ref 135–145)
Total Bilirubin: 0.4 mg/dL (ref 0.3–1.2)
Total Protein: 6.3 g/dL — ABNORMAL LOW (ref 6.5–8.1)

## 2020-05-20 LAB — MAGNESIUM: Magnesium: 1.9 mg/dL (ref 1.7–2.4)

## 2020-05-20 MED ORDER — SODIUM CHLORIDE 0.9% FLUSH
10.0000 mL | INTRAVENOUS | Status: DC | PRN
  Administered 2020-05-20: 10 mL
  Filled 2020-05-20: qty 10

## 2020-05-20 NOTE — Progress Notes (Signed)
[place] OFFICE PROGRESS NOTE   Diagnosis: Colon cancer  INTERVAL HISTORY:   Luis [name] returns as scheduled.  He completed cycle 11 FOLFIRI/Panitumumab 04/29/2020.  He denies nausea/vomiting.  No mouth sores.  No diarrhea.  Skin rash continues to be improved.  Objective:  Vital signs in last 24 hours:  Blood pressure 129/65, pulse 67, temperature 98.2 F (36.8 C), temperature source Tympanic, resp. rate 19, height 5' 11" (1.803 m), weight 239 lb 8 oz (108.6 kg), SpO2 98 %.    HEENT: No thrush or ulcers. Resp: Lungs clear bilaterally. Cardio: Regular rate and rhythm. GI: No hepatomegaly. Vascular: No leg edema.  Skin: Erythematous rash over the trunk. Port-A-Cath without erythema.  Lab Results:  Lab Results  Component Value Date   WBC 4.9 05/20/2020   HGB 11.4 (L) 05/20/2020   HCT 35.3 (L) 05/20/2020   MCV 94.4 05/20/2020   PLT 178 05/20/2020   NEUTROABS 2.8 05/20/2020    Imaging:  No results found.  Medications: I have reviewed the patient's current medications.  Assessment/Plan: 1. Adenocarcinoma of the cecum, stage IIA(T3N0), status post a right colectomy 07/26/2017 ? 0/16 lymph nodes positive, no lymphovascular invasion, perineural invasion present ? MSI-stable, no loss of mismatch repair protein expression ? Surveillance colonoscopy 07/26/2018-no polyps found ? Elevated CEA June 2020, persistently elevated August 2020 ? CTs 02/07/2019-bilateral liver metastases, no extrahepatic metastatic disease, changes of sarcoidosis in the chest ? Biopsy liver lesion 03/04/2019-metastatic adenocarcinoma, with morphology consistent with metastatic colonic adenocarcinoma, RAS WT ? Cycle 1 FOLFOX 03/12/2019 ? Cycle 2 FOLFOX 03/26/2019, oxaliplatin dose reduced secondary to thrombocytopenia ? Cycle 3 FOLFOX 04/09/2019, oxaliplatin further dose reduced and aspirin held secondary to thrombocytopenia ? Cycle 4 FOLFOX 04/23/2019 ? Cycle 5 FOLFOX  05/07/2019 ? CTs 05/23/2019-decrease in liver lesions. No new or progressive findings. ? Cycle 6 FOLFOX 05/27/2019 ? Cycle 7 FOLFOX 06/14/2019 ? Cycle 8 FOLFOX 07/02/2019-Udenyca held ? Cycle 9 FOLFOX 07/16/2019-Udenyca held ? Cycle 10 FOLFOX 07/30/2019-Udenyca held ? CT abdomen/pelvis 08/13/2019-mild improvement in previously noted liver lesions ? MRI abdomen 08/13/2019-3 liver lesions ? Cycle 1 FOLFIRI/Avastin 09/17/2019 ? Cycle 2 FOLFIRI/Avastin 10/01/2019 ? Cycle 3 FOLFIRI/Avastin 10/15/2019 ? Cycle 4 FOLFIRI/Avastin 11/05/2019 ? Cycle 5 FOLFIRI/Avastin 11/19/2019 ? CTs 11/27/2019-stable liver lesions; no new liver lesions. No other evidence of metastatic disease in the abdomen or pelvis. ? Cycle 1 FOLFIRI/Panitumumab 12/03/2019 ? Cycle 2 FOLFIRI/Panitumumab 12/17/2019 (Panitumumab dose reduced due to rash) ? Cycle 3 FOLFIRI/Panitumumab 01/01/2020 ? Cycle 4 FOLFIRI/Panitumumab 01/14/2020 ? Cycle 5 FOLFIRI/Panitumumab 01/28/2020 ? CT abdomen/pelvis 02/06/2020-decreased size of right hepatic lesions, subtle lesion in the lateral left hepatic lobe-not clearly identified, noevidence of disease progression ? Cycle 6 FOLFIRI/Panitumumab 02/11/2020 ? Cycle 7 FOLFIRI/Panitumumab 03/10/2020 ? Cycle 8 FOLFIRI/Panitumumab 03/26/2020 ? Cycle 9 FOLFIRI/Panitumumab 04/14/2020 ? CT abdomen/pelvis 04/26/2020-mild enlargement of 2 liver lesions on initial report, repeat review with radiology consistent with stable disease ? Cycle 10 FOLFIRI/Panitumumab 04/29/2020 ? MRI liver 05/19/2020-right hepatic lobe lesions diminished in size when compared to the study of March 2021.  Lesion in hepatic subsegment VI with very limited assessment as well due to adjacent bowel. ? 05/20/2020 referred to interventional radiology to consider ablation of liver lesions 2. History of colon polyps 3. Microcytic anemia January 2019 4. Sarcoidosis 5. Coronary artery disease 6. Port-A-Cath placement, interventional radiology,  03/04/2019 7. Thrombocytopenia, mild thrombocytopenia predating chemotherapy 8. Oxaliplatin neuropathy 9. Dyspnea on exertion-CT chest 02/25/2020 negative for PE, infection, pneumonitis; scheduled for PFTs 03/10/2020, followed by pulmonary  Disposition: Luis [name] appears stable.  He has completed 10 cycles of FOLFIRI/Panitumumab.  Recent restaging MRI of the liver shows right hepatic lobe lesions are decreased in size.  He may be an ablation candidate.  We made a referral to interventional radiology.  Chemotherapy is being placed on hold pending that evaluation.  His case will be presented at the GI tumor conference.  He will return for lab, follow-up, possible chemotherapy 06/07/2020.  We are available to see him sooner if needed.  Patient seen with Dr. Sherrill.    Lisa Thomas ANP/GNP-BC   05/20/2020  10:45 AM This was a shared visit with Lisa Thomas.  We discussed the MRI findings with Luis [name].  There is no evidence of disease progression.  The hepatic lesions are now potentially amenable to ablation.  We will refer him to interventional radiology and I will present his case at the GI tumor conference.  Systemic therapy will be placed on hold for now.  Brad Sherrill, MD       

## 2020-05-22 ENCOUNTER — Inpatient Hospital Stay: Payer: Medicare PPO

## 2020-05-26 ENCOUNTER — Other Ambulatory Visit: Payer: Self-pay

## 2020-05-26 ENCOUNTER — Emergency Department (HOSPITAL_COMMUNITY): Payer: Medicare PPO

## 2020-05-26 ENCOUNTER — Emergency Department (HOSPITAL_COMMUNITY)
Admission: EM | Admit: 2020-05-26 | Discharge: 2020-05-26 | Disposition: A | Discharge status: Home / Self Care | Payer: Medicare PPO | Attending: Emergency Medicine | Admitting: Emergency Medicine

## 2020-05-26 ENCOUNTER — Encounter: Payer: Self-pay | Admitting: *Deleted

## 2020-05-26 ENCOUNTER — Telehealth: Payer: Self-pay | Admitting: Interventional Cardiology

## 2020-05-26 ENCOUNTER — Ambulatory Visit
Admission: RE | Admit: 2020-05-26 | Discharge: 2020-05-26 | Disposition: A | Discharge status: Home / Self Care | Payer: Medicare PPO | Source: Ambulatory Visit | Attending: Nurse Practitioner | Admitting: Nurse Practitioner

## 2020-05-26 DIAGNOSIS — J90 Pleural effusion, not elsewhere classified: Secondary | ICD-10-CM | POA: Diagnosis not present

## 2020-05-26 DIAGNOSIS — I119 Hypertensive heart disease without heart failure: Secondary | ICD-10-CM | POA: Diagnosis not present

## 2020-05-26 DIAGNOSIS — Z7902 Long term (current) use of antithrombotics/antiplatelets: Secondary | ICD-10-CM | POA: Insufficient documentation

## 2020-05-26 DIAGNOSIS — C189 Malignant neoplasm of colon, unspecified: Secondary | ICD-10-CM | POA: Diagnosis not present

## 2020-05-26 DIAGNOSIS — I2510 Atherosclerotic heart disease of native coronary artery without angina pectoris: Secondary | ICD-10-CM | POA: Insufficient documentation

## 2020-05-26 DIAGNOSIS — R2243 Localized swelling, mass and lump, lower limb, bilateral: Secondary | ICD-10-CM | POA: Diagnosis not present

## 2020-05-26 DIAGNOSIS — F17290 Nicotine dependence, other tobacco product, uncomplicated: Secondary | ICD-10-CM | POA: Insufficient documentation

## 2020-05-26 DIAGNOSIS — R0789 Other chest pain: Secondary | ICD-10-CM | POA: Insufficient documentation

## 2020-05-26 DIAGNOSIS — Z79899 Other long term (current) drug therapy: Secondary | ICD-10-CM | POA: Insufficient documentation

## 2020-05-26 DIAGNOSIS — J8410 Pulmonary fibrosis, unspecified: Secondary | ICD-10-CM | POA: Diagnosis not present

## 2020-05-26 DIAGNOSIS — R079 Chest pain, unspecified: Secondary | ICD-10-CM

## 2020-05-26 DIAGNOSIS — C787 Secondary malignant neoplasm of liver and intrahepatic bile duct: Secondary | ICD-10-CM | POA: Diagnosis not present

## 2020-05-26 DIAGNOSIS — Z8546 Personal history of malignant neoplasm of prostate: Secondary | ICD-10-CM | POA: Diagnosis not present

## 2020-05-26 DIAGNOSIS — C180 Malignant neoplasm of cecum: Secondary | ICD-10-CM

## 2020-05-26 DIAGNOSIS — C801 Malignant (primary) neoplasm, unspecified: Secondary | ICD-10-CM | POA: Diagnosis not present

## 2020-05-26 DIAGNOSIS — I25119 Atherosclerotic heart disease of native coronary artery with unspecified angina pectoris: Secondary | ICD-10-CM

## 2020-05-26 DIAGNOSIS — Z20822 Contact with and (suspected) exposure to covid-19: Secondary | ICD-10-CM | POA: Diagnosis not present

## 2020-05-26 DIAGNOSIS — K449 Diaphragmatic hernia without obstruction or gangrene: Secondary | ICD-10-CM | POA: Diagnosis not present

## 2020-05-26 HISTORY — PX: IR RADIOLOGIST EVAL & MGMT: IMG5224

## 2020-05-26 LAB — CBC
HCT: 38.5 % — ABNORMAL LOW (ref 39.0–52.0)
Hemoglobin: 12 g/dL — ABNORMAL LOW (ref 13.0–17.0)
MCH: 30.3 pg (ref 26.0–34.0)
MCHC: 31.2 g/dL (ref 30.0–36.0)
MCV: 97.2 fL (ref 80.0–100.0)
Platelets: 175 10*3/uL (ref 150–400)
RBC: 3.96 MIL/uL — ABNORMAL LOW (ref 4.22–5.81)
RDW: 17.6 % — ABNORMAL HIGH (ref 11.5–15.5)
WBC: 6.1 10*3/uL (ref 4.0–10.5)
nRBC: 0 % (ref 0.0–0.2)

## 2020-05-26 LAB — BASIC METABOLIC PANEL
Anion gap: 9 (ref 5–15)
BUN: 19 mg/dL (ref 8–23)
CO2: 23 mmol/L (ref 22–32)
Calcium: 9 mg/dL (ref 8.9–10.3)
Chloride: 105 mmol/L (ref 98–111)
Creatinine, Ser: 0.86 mg/dL (ref 0.61–1.24)
GFR, Estimated: >60 mL/min (ref >60)
Glucose, Bld: 116 mg/dL — ABNORMAL HIGH (ref 70–99)
Potassium: 4.5 mmol/L (ref 3.5–5.1)
Sodium: 137 mmol/L (ref 135–145)

## 2020-05-26 LAB — RESP PANEL BY RT-PCR (FLU A&B, COVID) ARPGX2
Influenza A by PCR: NEGATIVE
Influenza B by PCR: NEGATIVE
SARS Coronavirus 2 by RT PCR: NEGATIVE

## 2020-05-26 LAB — TROPONIN I (HIGH SENSITIVITY)
Troponin I (High Sensitivity): 8 ng/L (ref <18)
Troponin I (High Sensitivity): 7 ng/L (ref <18)

## 2020-05-26 MED ORDER — HEPARIN BOLUS VIA INFUSION
4000.0000 [IU] | Freq: Once | INTRAVENOUS | Status: AC
  Administered 2020-05-26: 4000 [IU] via INTRAVENOUS
  Filled 2020-05-26: qty 4000

## 2020-05-26 MED ORDER — IOHEXOL 350 MG/ML SOLN
75.0000 mL | Freq: Once | INTRAVENOUS | Status: AC | PRN
  Administered 2020-05-26: 75 mL via INTRAVENOUS

## 2020-05-26 MED ORDER — HEPARIN (PORCINE) 25000 UT/250ML-% IV SOLN
1200.0000 [IU]/h | INTRAVENOUS | Status: DC
  Administered 2020-05-26: 1200 [IU]/h via INTRAVENOUS
  Filled 2020-05-26: qty 250

## 2020-05-26 NOTE — Progress Notes (Signed)
The proposed treatment discussed in conference is for discussion purposes only and is not a binding recommendation.  The patients have not been physically examined, or presented with their treatment options.  Therefore, final treatment plans cannot be decided.   

## 2020-05-26 NOTE — Discharge Instructions (Addendum)
Keep appointment with Dr. Irish Lack as scheduled regarding your chest pain  Return to the ED for any worsening symptoms

## 2020-05-26 NOTE — ED Provider Notes (Signed)
[facility] EMERGENCY DEPARTMENT Provider Note   CSN: [id number] Arrival date & time: 05/26/20  1006     History Chief Complaint  Patient presents with  . Chest Pain    Luis [name] is a 79 y.o. 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  Patient reports he took 3 nitroglycerin between 4 and 4:45 without much relief.  After the third nitroglycerin he had some relief of his pain.  He called his cardiologist today given he was having continued pain he told him to come to the ED for further evaluation.  Patient states that since that episode he has had intermittent sharp pain that lasts several seconds and then goes away.  He denies any shortness of breath, diaphoresis, nausea, vomiting.  Patient reports that when he had stents placed he was having only shortness of breath.  He states he never has shortness of breath and chest pain together.  He does report he is currently receiving chemotherapy, most recent chemotherapy session 2 weeks ago.  He is unsure if anything made the chest pain better or worse.  He is on Plavix however is not on anticoagulation medication.  Denies fevers, chills, any other associated symptoms.  Cath 01/02/2019  Patent diagonal stent.  Previously placed Ramus drug eluting stent is widely patent.  Prox LAD lesion is 75% stenosed.  A drug-eluting stent was successfully placed using a STENT SYNERGY DES 3X24.  Post intervention, there is a 0% residual stenosis.  1st Diag-1 lesion is 75% stenosed. Balloon angioplasty was performed using a BALLOON SAPPHIRE 2.0X12.[name]  Post intervention, there is a 40% residual stenosis.  The left ventricular systolic function is normal.  LV end diastolic pressure is mildly  elevated. LVEDP 15 mm Hg.  The left ventricular ejection fraction is 55-65% by visual estimate.  There is no aortic valve stenosis.    The history is provided by the patient and medical records.    HPI: A 79 year old patient with a history of peripheral artery disease, hypertension and hypercholesterolemia presents for evaluation of chest pain. Initial onset of pain was more than 6 hours ago. The patient's chest pain is not worse with exertion. The patient's chest pain is middle- or left-sided, is not well-localized, is not described as heaviness/pressure/tightness, is not sharp and does not radiate to the arms/jaw/neck. The patient does not complain of nausea and denies diaphoresis. The patient has a family history of coronary artery disease in a first-degree relative with onset less than age 51. The patient has no history of stroke, has not smoked in the past 90 days, denies any history of treated diabetes and does not have an elevated BMI (>=30).   Past Medical History:  Diagnosis Date  . Adenocarcinoma of cecum ([place]) 06/21/2017  . Arthritis    "mid back; hands; knees" (02/24/2015)  . Basal cell carcinoma    left shoulder; mid chest; right eyelid (02/24/2015)  . CAD (coronary artery disease)    a. 08/2014 Cath/PCI: LM nl, LAD 30p, D1 95 (2.25x12 Resolute Integrity DES), LCX small, RI 100 (attempted PCI) - branches fill via L->L collats, RCA dominant, nl, RPDA/PLA nl, EF 60^. b. 02/24/2015 PCI CTO of Ramus DES x2.  . Chronic bronchitis ([place])  hx  . Diverticulosis 05/2005  . Elevated lipase   . Fuchs' corneal dystrophy   . History of adenomatous polyp of colon 05/2005   8 mm adenoma  . History of blood transfusion    "related to some of my surgeries"  . Hyperlipidemia   . Hypertension   . Iron deficiency anemia due to chronic blood loss 06/21/2017  . Prostate cancer ([place]) 2011   S/P seed implant  . Sarcoidosis   . Thrombocytopenia ([place])    a. Noted on prior labs, unclear of what  w/u done.  . Trifascicular block     Patient Active Problem List   Diagnosis Date Noted  . Smoker 02/04/2020  . Liver metastases ([place]) 08/26/2019  . Port-A-Cath in place 03/26/2019  . Goals of care, counseling/discussion 02/25/2019  . Carotid stenosis 01/07/2019  . Cecal cancer ([place]) 07/26/2017  . Adenocarcinoma of cecum ([place]) 06/21/2017  . Iron deficiency anemia due to chronic blood loss 06/21/2017  . DOE (dyspnea on exertion) 03/30/2015  . Obesity (BMI 30-39.9) 03/30/2015  . Trifascicular block   . Angina pectoris ([place]) 02/24/2015  . CAD (coronary artery disease) 08/13/2014  . Essential hypertension 08/13/2014  . Hyperlipidemia 08/13/2014  . Pulmonary fibrosis ([place]) 08/13/2014  . Sarcoidosis   . Syncope 01/29/2012    Past Surgical History:  Procedure Laterality Date  . BASAL CELL CARCINOMA EXCISION Left    shoulder  . CARDIAC CATHETERIZATION N/A 02/24/2015   Procedure: Coronary/Bypass Graft CTO Intervention;  Surgeon: Jettie Booze, MD;  Location: [facility];  Service: Cardiovascular;  Laterality: N/A;  . CARDIAC CATHETERIZATION  02/24/2015   Procedure: Coronary/Graft Atherectomy;  Surgeon: Jettie Booze, MD;  Location: [facility];  Service: Cardiovascular;;  . CATARACT EXTRACTION W/ INTRAOCULAR LENS  IMPLANT, BILATERAL Bilateral ~ 2011-2012  . COLONOSCOPY W/ POLYPECTOMY  06/08/2005 and 10/18/2010   8 mm adenoma 2006, none 2012. Diverticulosis and internal hemorrhoids.  . CORNEAL TRANSPLANT Bilateral ~ 2011-2012   "@ same time as cataract OR"  . CORONARY ANGIOPLASTY WITH STENT PLACEMENT  08/2014; 02/24/2015   "1 stent + 1 stent"  . CORONARY STENT INTERVENTION N/A 01/02/2019   Procedure: CORONARY STENT INTERVENTION;  Surgeon: Jettie Booze, MD;  Location: [facility];  Service: Cardiovascular;  Laterality: N/A;  . EYE SURGERY    . FINGER SURGERY Right 2014   "reattached middle finger"  . INSERTION PROSTATE RADIATION SEED  ~ 2011  . IR  IMAGING GUIDED PORT INSERTION  03/04/2019  . IR RADIOLOGIST EVAL & MGMT  05/26/2020  . IR US GUIDE BX ASP/DRAIN  03/04/2019  . LAPAROSCOPIC CHOLECYSTECTOMY  2015  . LAPAROSCOPIC PARTIAL COLECTOMY N/A 07/26/2017   Procedure: LAPAROSCOPIC PARTIAL COLECTOMY, EXCISION SCROTAL CYST;  Surgeon: Greer Pickerel, MD;  Location: [facility];  Service: General;  Laterality: N/A;  . LEFT HEART CATH AND CORONARY ANGIOGRAPHY N/A 01/02/2019   Procedure: LEFT HEART CATH AND CORONARY ANGIOGRAPHY;  Surgeon: Jettie Booze, MD;  Location: [facility];  Service: Cardiovascular;  Laterality: N/A;  . LEFT HEART CATHETERIZATION WITH CORONARY ANGIOGRAM N/A 08/12/2014   Procedure: LEFT HEART CATHETERIZATION WITH CORONARY ANGIOGRAM;  Surgeon: Blane Ohara, MD;  Location: [facility];  Service: Cardiovascular;  Laterality: N/A;  . LYMPH NODE BIOPSY     mid chest  . MOHS SURGERY Right ~ 2011   "eyelid; for basal cell"  . THOROCOTOMY WITH LOBECTOMY Right ~ 1991   partial removal right lung  . TONSILLECTOMY  ~  1950       Family History  Problem Relation Age of Onset  . Heart disease Father 89       Died from "hardening of the arteries" age 68  . Coronary artery disease Sister 23  . Heart attack Sister   . Colon cancer Neg Hx   . Throat cancer Neg Hx   . Pancreatic cancer Neg Hx   . Prostate cancer Neg Hx   . Stroke Neg Hx   . Hypertension Neg Hx     Social History   Tobacco Use  . Smoking status: Light Tobacco Smoker    Packs/day: 0.00    Years: 59.00    Pack years: 0.00    Types: Cigars  . Smokeless tobacco: Never Used  . Tobacco comment: 02/24/2015 "quit cigarettes ~ 2000 ago but smokes cigars ionce/month"  Vaping Use  . Vaping Use: Never used  Substance Use Topics  . Alcohol use: Yes    Alcohol/week: 2.0 standard drinks    Types: 1 Glasses of wine, 1 Standard drinks or equivalent per week    Comment: Drinks maybe 1-2 drinks a week or less  . Drug use: No    Home Medications Prior to  Admission medications   Medication Sig Start Date End Date Taking? Authorizing Provider  amLODipine (NORVASC) 5 MG tablet TAKE 1 TABLET BY MOUTH TWICE A DAY 12/30/19   Jettie Booze, MD  CANNABIDIOL PO Take 12 drops by mouth 2 (two) times a day. CBD OIL    [provider]  Cholecalciferol (VITAMIN D3) 2000 units capsule Take 2,000 Units by mouth every evening.     [provider]  clopidogrel (PLAVIX) 75 MG tablet TAKE 1 TABLET (75 MG TOTAL) BY MOUTH DAILY WITH BREAKFAST. 01/24/19   Jettie Booze, MD  doxycycline (VIBRA-TABS) 100 MG tablet TAKE 1 TABLET BY MOUTH TWICE A DAY 04/28/20   Ladell Pier, MD  fluticasone (CUTIVATE) 0.05 % cream APPLY TO AFFECTED AREAS ON FACE 2 TIMES DAILY 03/09/20   Ladell Pier, MD  fluticasone furoate-vilanterol (BREO ELLIPTA) 200-25 MCG/INH AEPB Inhale 1 puff into the lungs daily. 04/28/20   Hunsucker, Bonna Gains, MD  gabapentin (NEURONTIN) 100 MG capsule Take 2 capsules (200 mg total) by mouth at bedtime. 03/26/20   Ladell Pier, MD  magnesium oxide (MAG-OX) 400 MG tablet Take 400 mg by mouth daily.     [provider]  metoprolol tartrate (LOPRESSOR) 25 MG tablet TAKE 1/2 TABLET BY MOUTH TWICE A DAY 06/27/19   Jettie Booze, MD  nitroGLYCERIN (NITROSTAT) 0.4 MG SL tablet PLACE 1 TABLET UNDER THE TONGUE EVERY 5 MINUTES AS NEEDED FOR CHEST PAIN FOR 3 DOSES Patient not taking: No sig reported 01/16/20   Jettie Booze, MD  Polyethylene Glycol 3350 (MIRALAX PO) Take 17 g by mouth daily as needed.    [provider]  prochlorperazine (COMPAZINE) 10 MG tablet Take 1 tablet (10 mg total) by mouth every 6 (six) hours as needed. Patient not taking: No sig reported 02/26/19   Ladell Pier, MD  ranolazine (RANEXA) 500 MG 12 hr tablet Take 1 tablet (500 mg total) by mouth 2 (two) times daily. 07/21/19   Jettie Booze, MD  rosuvastatin (CRESTOR) 20 MG tablet TAKE 1 TABLET BY MOUTH EVERY DAY 09/29/19    Jettie Booze, MD  traMADol (ULTRAM) 50 MG tablet Take 100 mg by mouth 2 (two) times a day.    [provider]  Allergies    Patient has no known allergies.  Review of Systems   Review of Systems  Constitutional: Negative for chills and fever.  Respiratory: Negative for cough and shortness of breath.   Cardiovascular: Positive for chest pain. Negative for palpitations and leg swelling.  Gastrointestinal: Negative for nausea and vomiting.  All other systems reviewed and are negative.   Physical Exam Updated Vital Signs BP (!) 143/77   Pulse 73   Temp 98.1 F (36.7 C) (Oral)   Resp 20   SpO2 99%   Physical Exam Vitals and nursing note reviewed.  Constitutional:      Appearance: He is not ill-appearing or diaphoretic.  HENT:     Head: Normocephalic and atraumatic.  Eyes:     Conjunctiva/sclera: Conjunctivae normal.  Cardiovascular:     Rate and Rhythm: Normal rate and regular rhythm.     Pulses:          Radial pulses are 2+ on the right side and 2+ on the left side.       Dorsalis pedis pulses are 2+ on the right side and 2+ on the left side.     Heart sounds: Normal heart sounds.  Pulmonary:     Effort: Pulmonary effort is normal.     Breath sounds: Normal breath sounds. No decreased breath sounds, wheezing, rhonchi or rales.  Chest:     Chest wall: Tenderness present.  Abdominal:     Palpations: Abdomen is soft.     Tenderness: There is no abdominal tenderness.  Musculoskeletal:     Cervical back: Neck supple.     Right lower leg: Edema present.     Left lower leg: Edema present.     Comments: 1+ pitting edema bilaterally  Skin:    General: Skin is warm and dry.  Neurological:     Mental Status: He is alert.     ED Results / Procedures / Treatments   Labs (all labs ordered are listed, but only abnormal results are displayed) Labs Reviewed  BASIC METABOLIC PANEL - Abnormal; Notable for the following components:      Result Value    Glucose, Bld 116 (*)    All other components within normal limits  CBC - Abnormal; Notable for the following components:   RBC 3.96 (*)    Hemoglobin 12.0 (*)    HCT 38.5 (*)    RDW 17.6 (*)    All other components within normal limits  RESP PANEL BY RT-PCR (FLU A&B, COVID) ARPGX2  TROPONIN I (HIGH SENSITIVITY)  TROPONIN I (HIGH SENSITIVITY)    EKG EKG Interpretation  Date/Time:  Wednesday May 26 2020 10:08:59 EST Ventricular Rate:  69 PR Interval:  264 QRS Duration: 132 QT Interval:  434 QTC Calculation: 465 R Axis:   -39 Text Interpretation: Sinus rhythm with 1st degree A-V block Left axis deviation Right bundle branch block Abnormal ECG No acute changes No significant change since last tracing Confirmed by Varney Biles ([id number]) on 05/26/2020 1:42:35 PM   Radiology DG Chest 2 View  Result Date: 05/26/2020 CLINICAL DATA:  Chest pain. EXAM: CHEST - 2 VIEW COMPARISON:  February 04, 2020. FINDINGS: Stable cardiomediastinal silhouette. Right internal jugular Port-A-Cath is unchanged in position. No pneumothorax or pleural effusion is noted. Stable scarring and postoperative changes noted in the right lung base. Left lung is clear. Bony thorax is unremarkable. IMPRESSION: No active cardiopulmonary disease. Electronically Signed   By: Marijo Conception M.D.   On: 05/26/2020  10:44   CT Angio Chest PE W/Cm &/Or Wo Cm  Result Date: 05/26/2020 CLINICAL DATA:  Chest pain. History of sarcoidosis. History of colon carcinoma EXAM: CT ANGIOGRAPHY CHEST WITH CONTRAST TECHNIQUE: Multidetector CT imaging of the chest was performed using the standard protocol during bolus administration of intravenous contrast. Multiplanar CT image reconstructions and MIPs were obtained to evaluate the vascular anatomy. CONTRAST:  25mL OMNIPAQUE IOHEXOL 350 MG/ML SOLN COMPARISON:  Chest radiograph May 26, 2020; CT angiogram chest February 25, 2020. Chest CT February 07, 2019 FINDINGS: Cardiovascular: There is  no demonstrable pulmonary embolus. There is no thoracic aortic aneurysm or dissection. There are scattered foci of calcification in proximal visualized great vessels. There are foci of aortic atherosclerosis. There are multiple foci of coronary artery calcification. There is no pericardial effusion or pericardial thickening. There is a Port-A-Cath with tip in the superior vena cava near the cavoatrial junction. Main pulmonary outflow tract measures 3.4 cm in diameter, prominent. Mediastinum/Nodes: Thyroid appears unremarkable. Multiple calcified lymph nodes again noted, consistent with prior granulomatous disease. Sarcoidosis most likely etiology. No adenopathy evident by size criteria. Small hiatal hernia present. Lungs/Pleura: Postoperative changes noted on the right. Scattered parenchymal granulomas again noted. There are scattered areas of scarring and atelectatic change bilaterally. There is persistent consolidation in a portion of the posterior segment right upper lobe. There are areas of mosaic attenuation in the lung parenchyma bilaterally. No appreciable pleural effusions. Upper Abdomen: Gallbladder absent. Subtle area of decreased attenuation in the right lobe of the liver inferiorly is smaller compared to prior study from 2020 and may represent treated metastasis. Upper abdominal aortic and major mesenteric arterial atherosclerotic vascular calcification noted. There is incomplete visualization of an angiomyolipoma in the right kidney anteriorly measuring 8 x 6 mm. Visualized upper abdominal structures otherwise appear unremarkable. Musculoskeletal: There are foci of degenerative change in the thoracic spine. No blastic or lytic bone lesions evident. No chest wall lesions appreciable. Review of the MIP images confirms the above findings. IMPRESSION: 1. No demonstrable pulmonary embolus. No thoracic aortic aneurysm or dissection. There are foci of aortic atherosclerosis as well as foci of great vessel and  coronary artery calcification. 2. Prominence of the main pulmonary tract is felt to be indicative of a degree of pulmonary arterial hypertension. 3. Areas of parenchymal lung scarring and granulomatous disease consistent with known sarcoidosis. Postoperative change on the right. Consolidation in a portion of the right upper lobe with associated cicatrization is stable and may represent chronic obstructive disease from sarcoidosis. 4. Somewhat mosaic attenuation at multiple sites in the lungs, likely due to underlying small airways obstructive disease. 5. No adenopathy by size criteria. Calcified lymph nodes likely due to sarcoidosis. 6.  Small hiatal hernia. 7.  Suspect treated metastasis inferior right lobe of the liver. 8.  Subcentimeter angiomyolipoma in the right kidney. 9.  Gallbladder absent. Aortic Atherosclerosis (ICD10-I70.0). Electronically Signed   By: Lowella Grip III M.D.   On: 05/26/2020 14:07   IR Radiologist Eval & Mgmt  Result Date: 05/26/2020 Please refer to notes tab for details about interventional procedure. (Op Note)   Procedures Procedures (including critical care time)  Medications Ordered in ED Medications  iohexol (OMNIPAQUE) 350 MG/ML injection 75 mL (75 mLs Intravenous Contrast Given 05/26/20 1340)  heparin bolus via infusion 4,000 Units (4,000 Units Intravenous Bolus from Bag 05/26/20 1516)    ED Course  I have reviewed the triage vital signs and the nursing notes.  Pertinent labs & imaging results that  were available during my care of the patient were reviewed by me and considered in my medical decision making (see chart for details).    MDM Rules/Calculators/A&P HEAR Score: 45                        79 year old male who presents to the ED today complaining of sudden onset of left-sided chest pain that woke him up out of his sleep around 4 AM this morning. He took three nitroglycerin with mild relief after the third one however continues to have remittent  episodes of chest pain the last several seconds.  No other symptoms including shortness of breath, nausea, vomiting, diaphoresis.  Lysed by his cardiologist to come to the ED for further evaluation.  On arrival vitals are stable.  Patient is afebrile, tachycardic and nontachypneic.  He appears to be in no acute distress.  During my evaluation he is not having any active chest pain however states that he had an episode several minutes ago.  EKG is essentially unchanged compared to baseline.  Chest x-ray clear.  Initial troponin of 8.  Plan to repeat.  On exam patient does have a small area of chest wall tenderness palpation along the left side.  He has equal pulses bilaterally.  Breath sounds are unlabored and equal.  No wheezes, rales, rhonchi.  Patient does have history of colon cancer with mets to the liver and currently receives chemotherapy, most recent 2 weeks ago.  He is on Plavix for his stents, not fully anticoagulated.  Given his history of cancer there is some concern for PE at this time however symptoms do sound more concerning for ACS.  Will obtain a CT scan.  We will plan for repeat troponin in the discussed with cardiology.  CTA negative. Repeat troponin 7  Having physician Dr. Kathrynn Humble has evaluated patient as well and recommends heparinizing at this time for concern for ACS with cardiology consultation.  Dicsussed case with Trish from cardiology, someone will come see patient.   Cardiology has evaluated patient - does not think he needs to come in today. Heparin discontinued at this time. Pt to follow up with cardiology - has an appointment with Dr. Irish Lack already scheduled. Stable for discharge.   This note was prepared using Dragon voice recognition software and may include unintentional dictation errors due to the inherent limitations of voice recognition software.  Final Clinical Impression(s) / ED Diagnoses Final diagnoses:  Nonspecific chest pain    Rx / DC Orders ED  Discharge Orders    None       Discharge Instructions     Keep appointment with Dr. Irish Lack as scheduled regarding your chest pain  Return to the ED for any worsening symptoms        Eustaquio Maize, PA-C 05/26/20 [place], [place], MD 05/27/20 [phone]

## 2020-05-26 NOTE — ED Triage Notes (Signed)
Pt with L sided chest pain with radiation to L arm onset this morning at 0400, woke him out of sleep. Took three nitro with change in pain from constant to intermittent. Hx CAD and stent placement x 3.

## 2020-05-26 NOTE — Progress Notes (Signed)
ANTICOAGULATION CONSULT NOTE  Pharmacy Consult for Heparin Indication: chest pain/ACS  No Known Allergies  Patient Measurements: Height: 5\' 11"  (180.3 cm) Weight: 108.6 kg (239 lb 6.7 oz) IBW/kg (Calculated) : 75.3 Heparin Dosing Weight: 98.5 kg  Vital Signs: Temp: 98.1 F (36.7 C) (12/15 1015) Temp Source: Oral (12/15 1015) BP: 143/77 (12/15 1245) Pulse Rate: 73 (12/15 1245)  Labs: Recent Labs    05/26/20 1018  HGB 12.0*  HCT 38.5*  PLT 175  CREATININE 0.86  TROPONINIHS 8    Estimated Creatinine Clearance: 87.3 mL/min (by C-G formula based on SCr of 0.86 mg/dL).   Medical History: Past Medical History:  Diagnosis Date  . Adenocarcinoma of cecum ([place]) 06/21/2017  . Arthritis    "mid back; hands; knees" (02/24/2015)  . Basal cell carcinoma    left shoulder; mid chest; right eyelid (02/24/2015)  . CAD (coronary artery disease)    a. 08/2014 Cath/PCI: LM nl, LAD 30p, D1 95 (2.25x12 Resolute Integrity DES), LCX small, RI 100 (attempted PCI) - branches fill via L->L collats, RCA dominant, nl, RPDA/PLA nl, EF 60^. b. 02/24/2015 PCI CTO of Ramus DES x2.  . Chronic bronchitis ([facility])    hx  . Diverticulosis 05/2005  . Elevated lipase   . Fuchs' corneal dystrophy   . History of adenomatous polyp of colon 05/2005   8 mm adenoma  . History of blood transfusion    "related to some of my surgeries"  . Hyperlipidemia   . Hypertension   . Iron deficiency anemia due to chronic blood loss 06/21/2017  . Prostate cancer ([place]) 2011   S/P seed implant  . Sarcoidosis   . Thrombocytopenia ([place])    a. Noted on prior labs, unclear of what w/u done.  . Trifascicular block     Medications:  Scheduled:  . heparin  4,000 Units Intravenous Once    Assessment: Patient is a 32 yom that presents to the ED with complaints of CP. The patient has a previous history of CAD and is s/p stent placement. With patients history and presentation pharmacy has been asked to start heparin at this time  for ACS/CP.   Goal of Therapy:  Heparin level 0.3-0.7 units/ml Monitor platelets by anticoagulation protocol: Yes   Plan:  - Heparin bolus 4000 units IV x 1 dose - Heparin drip @ 1200 units/hr - Heparin level in ~ 6 hours  - Monitor patient for s/s of bleeding and CBC while on heparin   Duanne Limerick PharmD. BCPS  05/26/2020,2:10 PM

## 2020-05-26 NOTE — Consult Note (Signed)
Cardiology Consultation:   Patient ID: Luis [name] MRN: [id number]; DOB: 1941-05-01  Admit date: 05/26/2020 Date of Consult: 05/26/2020  Primary Care Provider: [name], MD [facility] Cardiologist: Larae Grooms, MD  [facility] Electrophysiologist:  None    Patient Profile:   Luis [name] is a 79 y.o. male with a history of CAD s/p multiple PCIs (DES to 1st Diag in 08/2014, DES x2 to CTO of ramus in 02/2015, DES to LAD and balloon angioplasty to Diagonal bifurcation in 12/2018), carotid artery disease with known occlusion on right, sarcoidosis, hypertension, hyperlipidemia, and adenocarcinoma of cecum s/p right colectomy in 07/2017 with metastasis to liver on chemo, who is being seen today for the evaluation of chest pain at the request of Dr. Kathrynn Humble.   History of Present Illness:   Luis [name] is a 79 year old male with the above history who is followed by Dr. Irish Lack. At visit in 02/2019, patient reported some chest pain with eating certain foods and with exertion. Pain was felt to be atypical. Amlodipine was increased. At follow-up visit with Dr. Irish Lack, patient reported symptoms had resolved.  Patient called our office today with reports of chest pain that radiated down his left arm. He took 3 doses of Nitro as well as Pepcid with no results. He was advised to come to the ED for further evaluation.  Patient reports occasional chest pain while at rest that resolves before he can even get the Nitro. However, this occurs very infrequently. Patient reports he was in his usual state of health until this morning when he was awakened from sleep with left sided chest pain that radiated down the left side of his chest and down his arm. No associated diaphoresis, shortness of breath, or diaphoresis. He ranks the pain as a 3/10 on the pain scale at most. No exertional chest pain. He does also reports dyspnea on exertion for the past 3-4 months. Dyspnea was his presenting  symptom prior to previous PCIs. However, he states this feels different. He was seen by Pulmonology and was felt to have asthma. He also has a known history of sarcoidosis. He denies any orthopnea or PND. He has some lower extremity edema from the chemo. No palpitations, lightheadedness, dizziness, or syncope. No fever, chills, or recent illnesses. No abnormal bleeding in urine or stools.   In the ED, vitals stable. EKG shows normal sinus rhythm with no acute ischemic changes. High-sensitivity troponin negative x2. Chest x-ray showed no acute findings. WBC 6.1, Hgb 12.0, Plts 175. Na 137, K 4.5, Glucose 116, BUN 19, Cr 0.86. Respiratory panel pending.   At the time of this evaluation, he is chest pain free.   He reports that he smokes a cigar about every 2-3 months. He previously smoked cigarettes but quit about 20 years. He does report a family history of heart disease. His father died from a MI in his 80's. His sister recently passed away and she had a history of MIs as well.    Past Medical History:  Diagnosis Date  . Adenocarcinoma of cecum ([place]) 06/21/2017  . Arthritis    "mid back; hands; knees" (02/24/2015)  . Basal cell carcinoma    left shoulder; mid chest; right eyelid (02/24/2015)  . CAD (coronary artery disease)    a. 08/2014 Cath/PCI: LM nl, LAD 30p, D1 95 (2.25x12 Resolute Integrity DES), LCX small, RI 100 (attempted PCI) - branches fill via L->L collats, RCA dominant, nl, RPDA/PLA nl, EF 60^. b. 02/24/2015 PCI  CTO of Ramus DES x2.  . Chronic bronchitis (HCC)    hx  . Diverticulosis 05/2005  . Elevated lipase   . Fuchs' corneal dystrophy   . History of adenomatous polyp of colon 05/2005   8 mm adenoma  . History of blood transfusion    "related to some of my surgeries"  . Hyperlipidemia   . Hypertension   . Iron deficiency anemia due to chronic blood loss 06/21/2017  . Prostate cancer ([place]) 2011   S/P seed implant  . Sarcoidosis   . Thrombocytopenia ([place])    a. Noted on  prior labs, unclear of what w/u done.  . Trifascicular block     Past Surgical History:  Procedure Laterality Date  . BASAL CELL CARCINOMA EXCISION Left    shoulder  . CARDIAC CATHETERIZATION N/A 02/24/2015   Procedure: Coronary/Bypass Graft CTO Intervention;  Surgeon: Jettie Booze, MD;  Location: [facility];  Service: Cardiovascular;  Laterality: N/A;  . CARDIAC CATHETERIZATION  02/24/2015   Procedure: Coronary/Graft Atherectomy;  Surgeon: Jettie Booze, MD;  Location: [facility];  Service: Cardiovascular;;  . CATARACT EXTRACTION W/ INTRAOCULAR LENS  IMPLANT, BILATERAL Bilateral ~ 2011-2012  . COLONOSCOPY W/ POLYPECTOMY  06/08/2005 and 10/18/2010   8 mm adenoma 2006, none 2012. Diverticulosis and internal hemorrhoids.  . CORNEAL TRANSPLANT Bilateral ~ 2011-2012   "@ same time as cataract OR"  . CORONARY ANGIOPLASTY WITH STENT PLACEMENT  08/2014; 02/24/2015   "1 stent + 1 stent"  . CORONARY STENT INTERVENTION N/A 01/02/2019   Procedure: CORONARY STENT INTERVENTION;  Surgeon: Jettie Booze, MD;  Location: [facility];  Service: Cardiovascular;  Laterality: N/A;  . EYE SURGERY    . FINGER SURGERY Right 2014   "reattached middle finger"  . INSERTION PROSTATE RADIATION SEED  ~ 2011  . IR IMAGING GUIDED PORT INSERTION  03/04/2019  . IR RADIOLOGIST EVAL & MGMT  05/26/2020  . IR US GUIDE BX ASP/DRAIN  03/04/2019  . LAPAROSCOPIC CHOLECYSTECTOMY  2015  . LAPAROSCOPIC PARTIAL COLECTOMY N/A 07/26/2017   Procedure: LAPAROSCOPIC PARTIAL COLECTOMY, EXCISION SCROTAL CYST;  Surgeon: Greer Pickerel, MD;  Location: [facility];  Service: General;  Laterality: N/A;  . LEFT HEART CATH AND CORONARY ANGIOGRAPHY N/A 01/02/2019   Procedure: LEFT HEART CATH AND CORONARY ANGIOGRAPHY;  Surgeon: Jettie Booze, MD;  Location: [facility];  Service: Cardiovascular;  Laterality: N/A;  . LEFT HEART CATHETERIZATION WITH CORONARY ANGIOGRAM N/A 08/12/2014   Procedure: LEFT HEART  CATHETERIZATION WITH CORONARY ANGIOGRAM;  Surgeon: Blane Ohara, MD;  Location: [facility];  Service: Cardiovascular;  Laterality: N/A;  . LYMPH NODE BIOPSY     mid chest  . MOHS SURGERY Right ~ 2011   "eyelid; for basal cell"  . THOROCOTOMY WITH LOBECTOMY Right ~ 1991   partial removal right lung  . TONSILLECTOMY  ~ 1950     Home Medications:  Prior to Admission medications   Medication Sig Start Date End Date Taking? Authorizing Provider  amLODipine (NORVASC) 5 MG tablet TAKE 1 TABLET BY MOUTH TWICE A DAY 12/30/19   Jettie Booze, MD  CANNABIDIOL PO Take 12 drops by mouth 2 (two) times a day. CBD OIL    [provider]  Cholecalciferol (VITAMIN D3) 2000 units capsule Take 2,000 Units by mouth every evening.     [provider]  clopidogrel (PLAVIX) 75 MG tablet TAKE 1 TABLET (75 MG TOTAL) BY MOUTH DAILY WITH BREAKFAST.  01/24/19   Jettie Booze, MD  doxycycline (VIBRA-TABS) 100 MG tablet TAKE 1 TABLET BY MOUTH TWICE A DAY 04/28/20   Ladell Pier, MD  fluticasone (CUTIVATE) 0.05 % cream APPLY TO AFFECTED AREAS ON FACE 2 TIMES DAILY 03/09/20   Ladell Pier, MD  fluticasone furoate-vilanterol (BREO ELLIPTA) 200-25 MCG/INH AEPB Inhale 1 puff into the lungs daily. 04/28/20   Hunsucker, Bonna Gains, MD  gabapentin (NEURONTIN) 100 MG capsule Take 2 capsules (200 mg total) by mouth at bedtime. 03/26/20   Ladell Pier, MD  magnesium oxide (MAG-OX) 400 MG tablet Take 400 mg by mouth daily.     [provider]  metoprolol tartrate (LOPRESSOR) 25 MG tablet TAKE 1/2 TABLET BY MOUTH TWICE A DAY 06/27/19   Jettie Booze, MD  nitroGLYCERIN (NITROSTAT) 0.4 MG SL tablet PLACE 1 TABLET UNDER THE TONGUE EVERY 5 MINUTES AS NEEDED FOR CHEST PAIN FOR 3 DOSES Patient not taking: No sig reported 01/16/20   Jettie Booze, MD  Polyethylene Glycol 3350 (MIRALAX PO) Take 17 g by mouth daily as needed.    [provider]  prochlorperazine  (COMPAZINE) 10 MG tablet Take 1 tablet (10 mg total) by mouth every 6 (six) hours as needed. Patient not taking: No sig reported 02/26/19   Ladell Pier, MD  ranolazine (RANEXA) 500 MG 12 hr tablet Take 1 tablet (500 mg total) by mouth 2 (two) times daily. 07/21/19   Jettie Booze, MD  rosuvastatin (CRESTOR) 20 MG tablet TAKE 1 TABLET BY MOUTH EVERY DAY 09/29/19   Jettie Booze, MD  traMADol (ULTRAM) 50 MG tablet Take 100 mg by mouth 2 (two) times a day.    [provider]    Inpatient Medications: Scheduled Meds:  Continuous Infusions:  PRN Meds:   Allergies:   No Known Allergies  Social History:   Social History   Socioeconomic History  . Marital status: Married    Spouse name: Not on file  . Number of children: 2  . Years of education: Not on file  . Highest education level: Not on file  Occupational History  . Occupation: Unemployed  Tobacco Use  . Smoking status: Light Tobacco Smoker    Packs/day: 0.00    Years: 59.00    Pack years: 0.00    Types: Cigars  . Smokeless tobacco: Never Used  . Tobacco comment: 02/24/2015 "quit cigarettes ~ 2000 ago but smokes cigars ionce/month"  Vaping Use  . Vaping Use: Never used  Substance and Sexual Activity  . Alcohol use: Yes    Alcohol/week: 2.0 standard drinks    Types: 1 Glasses of wine, 1 Standard drinks or equivalent per week    Comment: Drinks maybe 1-2 drinks a week or less  . Drug use: No  . Sexual activity: Not on file  Other Topics Concern  . Not on file  Social History Narrative   Lives at home wife.     Social Determinants of Health   Financial Resource Strain: Not on file  Food Insecurity: Not on file  Transportation Needs: Not on file  Physical Activity: Not on file  Stress: Not on file  Social Connections: Not on file  Intimate Partner Violence: Not on file    Family History:    Family History  Problem Relation Age of Onset  . Heart disease Father 41       Died from  "hardening of the arteries" age 41  . Coronary artery disease  Sister 22  . Heart attack Sister   . Colon cancer Neg Hx   . Throat cancer Neg Hx   . Pancreatic cancer Neg Hx   . Prostate cancer Neg Hx   . Stroke Neg Hx   . Hypertension Neg Hx      ROS:  Please see the history of present illness.  All other ROS reviewed and negative.     Physical Exam/Data:   Vitals:   05/26/20 1400 05/26/20 1414 05/26/20 1500 05/26/20 1600  BP:  132/67 135/69 (!) 145/89  Pulse:  72 73 74  Resp:  20 18 16   Temp:      TempSrc:      SpO2:  98% 96% 98%  Weight: 108.6 kg     Height: 5\' 11"  (1.803 m)      No intake or output data in the 24 hours ending 05/26/20 1642 Last 3 Weights 05/26/2020 05/20/2020 04/29/2020  Weight (lbs) 239 lb 6.7 oz 239 lb 8 oz 240 lb 6.4 oz  Weight (kg) 108.6 kg 108.636 kg 109.045 kg     Body mass index is 33.39 kg/m.  General: 78 y.o. male resting comfortably in no acute distress. HEENT: Normocephalic and atraumatic. Sclera clear.  Neck: Supple. No carotid bruits. No JVD. Heart: RRR. Distinct S1 and S2. No murmurs, gallops, or rubs. Radial pulses 2+ and equal bilaterally. Lungs: No increased work of breathing. Clear to ausculation bilaterally. No wheezes, rhonchi, or rales.  Abdomen: Soft, non-distended, and non-tender to palpation. Bowel sounds present. MSK: Normal strength and tone for age. Extremities: 2+ pitting edema of bilateral lower extremities.    Skin: Warm and dry. Neuro: Alert and oriented x3. No focal deficits. Psych: Normal affect. Responds appropriately.   EKG:  The EKG was personally reviewed and demonstrates:  Normal sinus rhythm, rate 69 bpm, with 1 degree AV block and RBBB. No acute ST/T changes.   Telemetry:  Telemetry was personally reviewed and demonstrates:  Sinus rhythm with rates in the 60's to 80's.   Relevant CV Studies:  Left Heart Catheterization 01/02/2019:  Patent diagonal stent.  Previously placed Ramus drug eluting stent is  widely patent.  Prox LAD lesion is 75% stenosed.  A drug-eluting stent was successfully placed using a STENT SYNERGY DES 3X24.  Post intervention, there is a 0% residual stenosis.  1st Diag-1 lesion is 75% stenosed. Balloon angioplasty was performed using a BALLOON SAPPHIRE 2.0X12.[name]  Post intervention, there is a 40% residual stenosis.  The left ventricular systolic function is normal.  LV end diastolic pressure is mildly elevated. LVEDP 15 mm Hg.  The left ventricular ejection fraction is 55-65% by visual estimate.  There is no aortic valve stenosis.   Successful PCI of the LAD diagonal bifurcation.  Continue medical therapy as well.   Possible same day PCI candidate. _______________  Carotid Ultrasound 05/17/2020: Summary: - Right Carotid: Evidence consistent with a total occlusion of the right  ICA. The CCA appears occluded. Known occlusions of the proximal  CCA-distal ICA. Trickle flow noted in the ECA, possible  near-occlusion.  - Left Carotid: Velocities in the left ICA are consistent with a 1-39%  stenosis.  - Vertebrals: Bilateral vertebral arteries demonstrate antegrade flow.  - Subclavians: Normal flow hemodynamics were seen in bilateral subclavian arteries.     Laboratory Data:  High Sensitivity Troponin:   Recent Labs  Lab 05/26/20 1018 05/26/20 1303  TROPONINIHS 8 7     Chemistry Recent Labs  Lab 05/20/20 1028 05/26/20 1018  NA 138 137  K 4.5 4.5  CL 106 105  CO2 25 23  GLUCOSE 109* 116*  BUN 17 19  CREATININE 0.85 0.86  CALCIUM 9.1 9.0  GFRNONAA >60 >60  ANIONGAP 7 9    Recent Labs  Lab 05/20/20 1028  PROT 6.3*  ALBUMIN 3.6  AST 45*  ALT 38  ALKPHOS 67  BILITOT 0.4   Hematology Recent Labs  Lab 05/20/20 1028 05/26/20 1018  WBC 4.9 6.1  RBC 3.74* 3.96*  HGB 11.4* 12.0*  HCT 35.3* 38.5*  MCV 94.4 97.2  MCH 30.5 30.3  MCHC 32.3 31.2  RDW 18.3* 17.6*  PLT 178 175   BNPNo results for input(s): BNP, PROBNP in the last  168 hours.  DDimer No results for input(s): DDIMER in the last 168 hours.   Radiology/Studies:  DG Chest 2 View  Result Date: 05/26/2020 CLINICAL DATA:  Chest pain. EXAM: CHEST - 2 VIEW COMPARISON:  February 04, 2020. FINDINGS: Stable cardiomediastinal silhouette. Right internal jugular Port-A-Cath is unchanged in position. No pneumothorax or pleural effusion is noted. Stable scarring and postoperative changes noted in the right lung base. Left lung is clear. Bony thorax is unremarkable. IMPRESSION: No active cardiopulmonary disease. Electronically Signed   By: Marijo Conception M.D.   On: 05/26/2020 10:44   CT Angio Chest PE W/Cm &/Or Wo Cm  Result Date: 05/26/2020 CLINICAL DATA:  Chest pain. History of sarcoidosis. History of colon carcinoma EXAM: CT ANGIOGRAPHY CHEST WITH CONTRAST TECHNIQUE: Multidetector CT imaging of the chest was performed using the standard protocol during bolus administration of intravenous contrast. Multiplanar CT image reconstructions and MIPs were obtained to evaluate the vascular anatomy. CONTRAST:  55mL OMNIPAQUE IOHEXOL 350 MG/ML SOLN COMPARISON:  Chest radiograph May 26, 2020; CT angiogram chest February 25, 2020. Chest CT February 07, 2019 FINDINGS: Cardiovascular: There is no demonstrable pulmonary embolus. There is no thoracic aortic aneurysm or dissection. There are scattered foci of calcification in proximal visualized great vessels. There are foci of aortic atherosclerosis. There are multiple foci of coronary artery calcification. There is no pericardial effusion or pericardial thickening. There is a Port-A-Cath with tip in the superior vena cava near the cavoatrial junction. Main pulmonary outflow tract measures 3.4 cm in diameter, prominent. Mediastinum/Nodes: Thyroid appears unremarkable. Multiple calcified lymph nodes again noted, consistent with prior granulomatous disease. Sarcoidosis most likely etiology. No adenopathy evident by size criteria. Small hiatal  hernia present. Lungs/Pleura: Postoperative changes noted on the right. Scattered parenchymal granulomas again noted. There are scattered areas of scarring and atelectatic change bilaterally. There is persistent consolidation in a portion of the posterior segment right upper lobe. There are areas of mosaic attenuation in the lung parenchyma bilaterally. No appreciable pleural effusions. Upper Abdomen: Gallbladder absent. Subtle area of decreased attenuation in the right lobe of the liver inferiorly is smaller compared to prior study from 2020 and may represent treated metastasis. Upper abdominal aortic and major mesenteric arterial atherosclerotic vascular calcification noted. There is incomplete visualization of an angiomyolipoma in the right kidney anteriorly measuring 8 x 6 mm. Visualized upper abdominal structures otherwise appear unremarkable. Musculoskeletal: There are foci of degenerative change in the thoracic spine. No blastic or lytic bone lesions evident. No chest wall lesions appreciable. Review of the MIP images confirms the above findings. IMPRESSION: 1. No demonstrable pulmonary embolus. No thoracic aortic aneurysm or dissection. There are foci of aortic atherosclerosis as well as foci of great vessel and coronary artery calcification. 2. Prominence of the main  pulmonary tract is felt to be indicative of a degree of pulmonary arterial hypertension. 3. Areas of parenchymal lung scarring and granulomatous disease consistent with known sarcoidosis. Postoperative change on the right. Consolidation in a portion of the right upper lobe with associated cicatrization is stable and may represent chronic obstructive disease from sarcoidosis. 4. Somewhat mosaic attenuation at multiple sites in the lungs, likely due to underlying small airways obstructive disease. 5. No adenopathy by size criteria. Calcified lymph nodes likely due to sarcoidosis. 6.  Small hiatal hernia. 7.  Suspect treated metastasis inferior  right lobe of the liver. 8.  Subcentimeter angiomyolipoma in the right kidney. 9.  Gallbladder absent. Aortic Atherosclerosis (ICD10-I70.0). Electronically Signed   By: Lowella Grip III M.D.   On: 05/26/2020 14:07   IR Radiologist Eval & Mgmt  Result Date: 05/26/2020 Please refer to notes tab for details about interventional procedure. (Op Note)    Assessment and Plan:   Chest Pain History of CAD  - Patient presented with chest pain that woke him up from sleep. No exertional chest pain. He has a history of CAD s/p multiple PCIs (DES to 1st Diag in 08/2014, DES x2 to CTO of ramus in 02/2015, DES to LAD and balloon angioplasty to Diagonal bifurcation in 12/2018). - EKG shows no acute ischemic changes. - High-sensitivity troponin negative x2.  - Chest CTA negative for PE. - Currently chest pain free.  - Continue Plavix 75mg  daily and Crestor 20mg  daily. - Continue antianginals: home Amlodipine 5mg  twice daily, Lopressor 12.5mg  twice daily, Ranexa 500mg  twice daily.  - Discussed with MD - presentation atypical and unlike prior presenting symptoms. OK to be discharge. If patient has recurrent pain, will likely need repeat ischemic evaluation.  Carotid Artery Disease - History of known occlusion on the right. Most recent dopplers in 05/2020 showed stable disease. - Continue Plavix and statin.  Hypertension - BP well controlled. - Continue home medications.  Hyperlipidemia - Continue home Crestor    HEAR Score (for undifferentiated chest pain):  HEAR Score: 4    For questions or updates, please contact [place] Please consult [email or web address] for contact info under    Signed, Darreld Mclean, PA-C  05/26/2020 4:42 PM

## 2020-05-26 NOTE — ED Notes (Signed)
Patient verbalizes understanding of discharge instructions. Opportunity for questioning and answers were provided. Armband removed by staff, pt discharged from ED.  

## 2020-05-26 NOTE — Telephone Encounter (Signed)
Returned call to Pt.  Per Pt he woke up this morning at 4:00 am with chest pain radiating down his left arm.  Calls it a 3/10 on pain scale.  He took 3 nitro tabs with no relief.  Also took a pepcid with no relief.  Pt has history of stents placed by Dr. Irish Lack.  Per Pt this pain is the same as he experienced prior to his previous stent placements.  He denies other symptoms with chest pain.  Spoke with Dr. Irish Lack.  Per Dr. Irish Lack have Pt go to ER for evaluation.  Pt notified and in agreement.

## 2020-05-26 NOTE — Telephone Encounter (Signed)
    Pt c/o of Chest Pain: STAT if CP now or developed within 24 hours  1. Are you having CP right now? Yes  2. Are you experiencing any other symptoms (ex. SOB, nausea, vomiting, sweating)? No  3. How long have you been experiencing CP? Started this morning  4. Is your CP continuous or coming and going? Coming and going   5. Have you taken Nitroglycerin? Yes, took 3  Pt said he felt CP this morning already took 3 and he still feel the pressure on his chect ?

## 2020-05-26 NOTE — Consult Note (Addendum)
Chief Complaint: Metastatic Colorectal Carcinoma  Referring Physician(s): Dr. Benay Spice  History of Present Illness: Luis [name] is a 79 y.o. male presents to [facility] today as a scheduled consultation for discussion of metastatic colorectal carcinoma, possible locoregional therapy and candidacy.    Luis [name] is scheduled today as a virtual telemedicine visit with our clinic.  He joins [place] today with his wife, and actually is he in the [facility] ED during our phone call interview.  He is experiencing some chest pain which brought him to the ED for evaluation.  We confirmed his identity with 2 personal identifiers.  He prefers to continue our consult today instead of reschedule.   Luis [name]'s colorectal carcinoma was discovered on colonoscopy June 20, 2017.  He then had surgical follow up with Dr. Greer Pickerel, undergoing resection 07/26/2017.    Path report:     He then had increased CEA discovered during surveillance, and re-staging CT on 02/07/19 confirmed liver metastatic disease, with 2 right sided liver lesions (6.4cm segment 6, 5.5cm segment 7) and single left liver lesion (3.4cm segment 2).   Biopsy was performed with Path   He was initiated on chemotherapy with Dr. Benay Spice, with the records indicating cycle 1 with FOLFOX 03/12/2019.  The 3 lesions improved, but were persisting on follow up imaging after 10 cycles, and his strategy was changed to FOLFIRI/Avastin 09/17/2019. Then FOLFIRI/Vectibix 12/03/2019.  Last dose 04/29/20.   He has had some oxaliplatin neuropathy, and skin rash, but otherwise he tells me he generally feels well.  The current chest pain is a new problem.   He is active during the day, with ECOG of 1.    His most recent imaging includes a CT 04/26/2020, and MRI 05/19/2020.  There is decreased size of the 2 right sided lesions compared to baseline imaging, and the left lesion is not well seen on either CT or MRI.    Past Medical History:  Diagnosis Date  .  Adenocarcinoma of cecum ([place]) 06/21/2017  . Arthritis    "mid back; hands; knees" (02/24/2015)  . Basal cell carcinoma    left shoulder; mid chest; right eyelid (02/24/2015)  . CAD (coronary artery disease)    a. 08/2014 Cath/PCI: LM nl, LAD 30p, D1 95 (2.25x12 Resolute Integrity DES), LCX small, RI 100 (attempted PCI) - branches fill via L->L collats, RCA dominant, nl, RPDA/PLA nl, EF 60^. b. 02/24/2015 PCI CTO of Ramus DES x2.  . Chronic bronchitis (HCC)    hx  . Diverticulosis 05/2005  . Elevated lipase   . Fuchs' corneal dystrophy   . History of adenomatous polyp of colon 05/2005   8 mm adenoma  . History of blood transfusion    "related to some of my surgeries"  . Hyperlipidemia   . Hypertension   . Iron deficiency anemia due to chronic blood loss 06/21/2017  . Prostate cancer ([place]) 2011   S/P seed implant  . Sarcoidosis   . Thrombocytopenia ([place])    a. Noted on prior labs, unclear of what w/u done.  . Trifascicular block     Past Surgical History:  Procedure Laterality Date  . BASAL CELL CARCINOMA EXCISION Left    shoulder  . CARDIAC CATHETERIZATION N/A 02/24/2015   Procedure: Coronary/Bypass Graft CTO Intervention;  Surgeon: Jettie Booze, MD;  Location: [facility];  Service: Cardiovascular;  Laterality: N/A;  . CARDIAC CATHETERIZATION  02/24/2015   Procedure: Coronary/Graft Atherectomy;  Surgeon: Jettie Booze, MD;  Location: [place];  Service: Cardiovascular;;  . CATARACT EXTRACTION W/ INTRAOCULAR LENS  IMPLANT, BILATERAL Bilateral ~ 2011-2012  . COLONOSCOPY W/ POLYPECTOMY  06/08/2005 and 10/18/2010   8 mm adenoma 2006, none 2012. Diverticulosis and internal hemorrhoids.  . CORNEAL TRANSPLANT Bilateral ~ 2011-2012   "@ same time as cataract OR"  . CORONARY ANGIOPLASTY WITH STENT PLACEMENT  08/2014; 02/24/2015   "1 stent + 1 stent"  . CORONARY STENT INTERVENTION N/A 01/02/2019   Procedure: CORONARY STENT INTERVENTION;  Surgeon: Jettie Booze, MD;  Location: [facility];  Service: Cardiovascular;  Laterality: N/A;  . EYE SURGERY    . FINGER SURGERY Right 2014   "reattached middle finger"  . INSERTION PROSTATE RADIATION SEED  ~ 2011  . IR IMAGING GUIDED PORT INSERTION  03/04/2019  . IR US GUIDE BX ASP/DRAIN  03/04/2019  . LAPAROSCOPIC CHOLECYSTECTOMY  2015  . LAPAROSCOPIC PARTIAL COLECTOMY N/A 07/26/2017   Procedure: LAPAROSCOPIC PARTIAL COLECTOMY, EXCISION SCROTAL CYST;  Surgeon: Greer Pickerel, MD;  Location: [facility];  Service: General;  Laterality: N/A;  . LEFT HEART CATH AND CORONARY ANGIOGRAPHY N/A 01/02/2019   Procedure: LEFT HEART CATH AND CORONARY ANGIOGRAPHY;  Surgeon: Jettie Booze, MD;  Location: [place] [facility];  Service: Cardiovascular;  Laterality: N/A;  . LEFT HEART CATHETERIZATION WITH CORONARY ANGIOGRAM N/A 08/12/2014   Procedure: LEFT HEART CATHETERIZATION WITH CORONARY ANGIOGRAM;  Surgeon: Blane Ohara, MD;  Location: [facility] C[facility];  Service: Cardiovascular;  Laterality: N/A;  . LYMPH NODE BIOPSY     mid chest  . MOHS SURGERY Right ~ 2011   "eyelid; for basal cell"  . THOROCOTOMY WITH LOBECTOMY Right ~ 1991   partial removal right lung  . TONSILLECTOMY  ~ 1950    Allergies: Patient has no known allergies.  Medications: Prior to Admission medications   Medication Sig Start Date End Date Taking? Authorizing Provider  amLODipine (NORVASC) 5 MG tablet TAKE 1 TABLET BY MOUTH TWICE A DAY 12/30/19   Jettie Booze, MD  CANNABIDIOL PO Take 12 drops by mouth 2 (two) times a day. CBD OIL    [provider]  Cholecalciferol (VITAMIN D3) 2000 units capsule Take 2,000 Units by mouth every evening.     [provider]  clopidogrel (PLAVIX) 75 MG tablet TAKE 1 TABLET (75 MG TOTAL) BY MOUTH DAILY WITH BREAKFAST. 01/24/19   Jettie Booze, MD  doxycycline (VIBRA-TABS) 100 MG tablet TAKE 1 TABLET BY MOUTH TWICE A DAY 04/28/20   Ladell Pier, MD  fluticasone (CUTIVATE) 0.05 %  cream APPLY TO AFFECTED AREAS ON FACE 2 TIMES DAILY 03/09/20   Ladell Pier, MD  fluticasone furoate-vilanterol (BREO ELLIPTA) 200-25 MCG/INH AEPB Inhale 1 puff into the lungs daily. 04/28/20   Hunsucker, Bonna Gains, MD  gabapentin (NEURONTIN) 100 MG capsule Take 2 capsules (200 mg total) by mouth at bedtime. 03/26/20   Ladell Pier, MD  magnesium oxide (MAG-OX) 400 MG tablet Take 400 mg by mouth daily.     [provider]  metoprolol tartrate (LOPRESSOR) 25 MG tablet TAKE 1/2 TABLET BY MOUTH TWICE A DAY 06/27/19   Jettie Booze, MD  nitroGLYCERIN (NITROSTAT) 0.4 MG SL tablet PLACE 1 TABLET UNDER THE TONGUE EVERY 5 MINUTES AS NEEDED FOR CHEST PAIN FOR 3 DOSES Patient not taking: No sig reported 01/16/20   Jettie Booze, MD  Polyethylene Glycol 3350 (MIRALAX PO) Take 17 g by mouth daily as needed.  [provider]  prochlorperazine (COMPAZINE) 10 MG tablet Take 1 tablet (10 mg total) by mouth every 6 (six) hours as needed. Patient not taking: No sig reported 02/26/19   Ladell Pier, MD  ranolazine (RANEXA) 500 MG 12 hr tablet Take 1 tablet (500 mg total) by mouth 2 (two) times daily. 07/21/19   Jettie Booze, MD  rosuvastatin (CRESTOR) 20 MG tablet TAKE 1 TABLET BY MOUTH EVERY DAY 09/29/19   Jettie Booze, MD  traMADol (ULTRAM) 50 MG tablet Take 100 mg by mouth 2 (two) times a day.    [provider]     Family History  Problem Relation Age of Onset  . Heart disease Father 93       Died from "hardening of the arteries" age 40  . Coronary artery disease Sister 33  . Heart attack Sister   . Colon cancer Neg Hx   . Throat cancer Neg Hx   . Pancreatic cancer Neg Hx   . Prostate cancer Neg Hx   . Stroke Neg Hx   . Hypertension Neg Hx     Social History   Socioeconomic History  . Marital status: Married    Spouse name: Not on file  . Number of children: 2  . Years of education: Not on file  . Highest education level: Not on file   Occupational History  . Occupation: Unemployed  Tobacco Use  . Smoking status: Light Tobacco Smoker    Packs/day: 0.00    Years: 59.00    Pack years: 0.00    Types: Cigars  . Smokeless tobacco: Never Used  . Tobacco comment: 02/24/2015 "quit cigarettes ~ 2000 ago but smokes cigars ionce/month"  Vaping Use  . Vaping Use: Never used  Substance and Sexual Activity  . Alcohol use: Yes    Alcohol/week: 2.0 standard drinks    Types: 1 Glasses of wine, 1 Standard drinks or equivalent per week    Comment: Drinks maybe 1-2 drinks a week or less  . Drug use: No  . Sexual activity: Not on file  Other Topics Concern  . Not on file  Social History Narrative   Lives at home wife.     Social Determinants of Health   Financial Resource Strain: Not on file  Food Insecurity: Not on file  Transportation Needs: Not on file  Physical Activity: Not on file  Stress: Not on file  Social Connections: Not on file    ECOG Status: 1 - Symptomatic but completely ambulatory  Review of Systems  Review of Systems: A 12 point ROS discussed and pertinent positives are indicated in the HPI above.  All other systems are negative.  Physical Exam No direct physical exam was performed (except for noted visual exam findings with Video Visits).    Vital Signs: There were no vitals taken for this visit.  Imaging: DG Chest 2 View  Result Date: 05/26/2020 CLINICAL DATA:  Chest pain. EXAM: CHEST - 2 VIEW COMPARISON:  February 04, 2020. FINDINGS: Stable cardiomediastinal silhouette. Right internal jugular Port-A-Cath is unchanged in position. No pneumothorax or pleural effusion is noted. Stable scarring and postoperative changes noted in the right lung base. Left lung is clear. Bony thorax is unremarkable. IMPRESSION: No active cardiopulmonary disease. Electronically Signed   By: Marijo Conception M.D.   On: 05/26/2020 10:44   CT Angio Chest PE W/Cm &/Or Wo Cm  Result Date: 05/26/2020 CLINICAL DATA:  Chest  pain. History of sarcoidosis. History of  colon carcinoma EXAM: CT ANGIOGRAPHY CHEST WITH CONTRAST TECHNIQUE: Multidetector CT imaging of the chest was performed using the standard protocol during bolus administration of intravenous contrast. Multiplanar CT image reconstructions and MIPs were obtained to evaluate the vascular anatomy. CONTRAST:  48mL OMNIPAQUE IOHEXOL 350 MG/ML SOLN COMPARISON:  Chest radiograph May 26, 2020; CT angiogram chest February 25, 2020. Chest CT February 07, 2019 FINDINGS: Cardiovascular: There is no demonstrable pulmonary embolus. There is no thoracic aortic aneurysm or dissection. There are scattered foci of calcification in proximal visualized great vessels. There are foci of aortic atherosclerosis. There are multiple foci of coronary artery calcification. There is no pericardial effusion or pericardial thickening. There is a Port-A-Cath with tip in the superior vena cava near the cavoatrial junction. Main pulmonary outflow tract measures 3.4 cm in diameter, prominent. Mediastinum/Nodes: Thyroid appears unremarkable. Multiple calcified lymph nodes again noted, consistent with prior granulomatous disease. Sarcoidosis most likely etiology. No adenopathy evident by size criteria. Small hiatal hernia present. Lungs/Pleura: Postoperative changes noted on the right. Scattered parenchymal granulomas again noted. There are scattered areas of scarring and atelectatic change bilaterally. There is persistent consolidation in a portion of the posterior segment right upper lobe. There are areas of mosaic attenuation in the lung parenchyma bilaterally. No appreciable pleural effusions. Upper Abdomen: Gallbladder absent. Subtle area of decreased attenuation in the right lobe of the liver inferiorly is smaller compared to prior study from 2020 and may represent treated metastasis. Upper abdominal aortic and major mesenteric arterial atherosclerotic vascular calcification noted. There is incomplete  visualization of an angiomyolipoma in the right kidney anteriorly measuring 8 x 6 mm. Visualized upper abdominal structures otherwise appear unremarkable. Musculoskeletal: There are foci of degenerative change in the thoracic spine. No blastic or lytic bone lesions evident. No chest wall lesions appreciable. Review of the MIP images confirms the above findings. IMPRESSION: 1. No demonstrable pulmonary embolus. No thoracic aortic aneurysm or dissection. There are foci of aortic atherosclerosis as well as foci of great vessel and coronary artery calcification. 2. Prominence of the main pulmonary tract is felt to be indicative of a degree of pulmonary arterial hypertension. 3. Areas of parenchymal lung scarring and granulomatous disease consistent with known sarcoidosis. Postoperative change on the right. Consolidation in a portion of the right upper lobe with associated cicatrization is stable and may represent chronic obstructive disease from sarcoidosis. 4. Somewhat mosaic attenuation at multiple sites in the lungs, likely due to underlying small airways obstructive disease. 5. No adenopathy by size criteria. Calcified lymph nodes likely due to sarcoidosis. 6.  Small hiatal hernia. 7.  Suspect treated metastasis inferior right lobe of the liver. 8.  Subcentimeter angiomyolipoma in the right kidney. 9.  Gallbladder absent. Aortic Atherosclerosis (ICD10-I70.0). Electronically Signed   By: Lowella Grip III M.D.   On: 05/26/2020 14:07   Luis LIVER W WO CONTRAST  Result Date: 05/20/2020 CLINICAL DATA:  Restaging of colonic carcinoma in this 79 year old male. Recent imaging displayed increased size of hepatic lesions. EXAM: MRI ABDOMEN WITHOUT AND WITH CONTRAST TECHNIQUE: Multiplanar multisequence Luis imaging of the abdomen was performed both before and after the administration of intravenous contrast. CONTRAST:  65mL MULTIHANCE GADOBENATE DIMEGLUMINE 529 MG/ML IV SOLN COMPARISON:  Examination from April 26, 2020  FINDINGS: Lower chest: Incidental imaging of the lung bases on MRI is unremarkable. Hepatobiliary: No signs of fat or iron deposition in the liver. Post cholecystectomy without biliary ductal dilation. Lesion in the posterior RIGHT hepatic lobe, lateral RIGHT hepatic lobe on precontrast  imaging measuring approximately 2.9 x 2.2 cm as compared to 4.0 x 3.3 cm in March of 2021. 2.3 x 1.8 cm on post-contrast images this is at the boundary of hepatic subsegment VII and VIII. No appreciable internal enhancement. Study limited by artifact in the anterior liver particularly on the LEFT with stable appearance of a small area the anterior LEFT hepatic lobe, hepatic subsegment II Area in hepatic subsegment VI not well evaluated better seen on recent abdomen and pelvis CT, grossly stable compared to imaging from November. Portal vein is patent. Pancreas:  No peripancreatic inflammation or ductal dilation. Spleen:  Normal appearance of the spleen. Adrenals/Urinary Tract: Adrenal glands are normal. Symmetric renal enhancement. No suspicious renal lesion or hydronephrosis. Small angiomyolipoma as on previous imaging. Stomach/Bowel: Gastrointestinal tract without acute process to the extent evaluated, limited assessment on MRI not performed for bowel evaluation. Vascular/Lymphatic:  No adenopathy.  Normal caliber abdominal aorta. Other:  No ascites. Musculoskeletal: No focal lesion to the extent evaluated. IMPRESSION: 1. Signs of metastatic disease in the liver as seen on prior imaging with evidence of post treatment changes. RIGHT hepatic lobe lesions are diminished in size when compared to the study of March of 2021. Accounting for differences in comparison, comparing CT with MRI lesions may be slightly smaller compared to the most recent comparison though do not appear larger and do not display appreciable internal enhancement. 2. Lesion in hepatic subsegment VI with very limited assessment as well due to adjacent bowel.  Continued follow-up with CT may be best given patient body habitus and the number of CT comparisons. 3. No adenopathy or other signs of disease. 4. Small RIGHT upper pole angiomyolipoma. Electronically Signed   By: Zetta Bills M.D.   On: 05/20/2020 11:45   VAS US CAROTID  Result Date: 05/18/2020 Carotid Arterial Duplex Study Indications:       Carotid artery disease with right CCA and ICA occlusions.                    Patient denies any cerebrovascular symptoms. Risk Factors:      Hypertension, hyperlipidemia, current smoker, coronary artery                    disease. Limitations        Technically challenging exam due to patient limited mobility                    of neck (neck fusion). Comparison Study:  Previous carotid duplex performed 05/01/20 showed occlusion                    of the right CCA and ICA with trickle flow in the ECA. LICA                    velocities of 86/28 cm/sec. Performing Technologist: Mariane Masters RVT  Examination Guidelines: A complete evaluation includes B-mode imaging, spectral Doppler, color Doppler, and power Doppler as needed of all accessible portions of each vessel. Bilateral testing is considered an integral part of a complete examination. Limited examinations for reoccurring indications may be performed as noted.  Right Carotid Findings: +----------+--------+--------+--------+--------------------+-------------------+           PSV cm/sEDV cm/sStenosisPlaque Description  Comments            +----------+--------+--------+--------+--------------------+-------------------+ CCA Prox  0       0       Occludeddiffuse and calcific                    +----------+--------+--------+--------+--------------------+-------------------+  CCA Distal0       0       Occludeddiffuse and calcific                    +----------+--------+--------+--------+--------------------+-------------------+ ICA Prox  0       0       Occludedheterogenous                             +----------+--------+--------+--------+--------------------+-------------------+ ICA Mid   0       0       Occludedheterogenous                            +----------+--------+--------+--------+--------------------+-------------------+ ECA       38      0                                   low flow                                                                  velocities, near                                                          occlusion           +----------+--------+--------+--------+--------------------+-------------------+ +----------+--------+-------+----------------+-------------------+           PSV cm/sEDV cmsDescribe        Arm Pressure (mmHG) +----------+--------+-------+----------------+-------------------+ [id number]            Multiphasic, [id number]                 +----------+--------+-------+----------------+-------------------+ +---------+--------+--+--------+--+---------+ VertebralPSV cm/s91EDV cm/s16Antegrade +---------+--------+--+--------+--+---------+  Left Carotid Findings: +----------+--------+--------+--------+------------------+--------+           PSV cm/sEDV cm/sStenosisPlaque DescriptionComments +----------+--------+--------+--------+------------------+--------+ CCA Prox  126     26                                         +----------+--------+--------+--------+------------------+--------+ CCA Distal100     22                                         +----------+--------+--------+--------+------------------+--------+ ICA Prox  98      32      1-39%   heterogenous               +----------+--------+--------+--------+------------------+--------+ ICA Mid   98      24              heterogenous               +----------+--------+--------+--------+------------------+--------+ ICA Distal111     35                                          +----------+--------+--------+--------+------------------+--------+  ECA       171     30                                         +----------+--------+--------+--------+------------------+--------+ +----------+--------+--------+----------------+-------------------+           PSV cm/sEDV cm/sDescribe        Arm Pressure (mmHG) +----------+--------+--------+----------------+-------------------+ [id number]             Multiphasic, [id number]                 +----------+--------+--------+----------------+-------------------+ +---------+--------+--+--------+--+---------+ VertebralPSV cm/s71EDV cm/s15Antegrade +---------+--------+--+--------+--+---------+   Summary: Right Carotid: Evidence consistent with a total occlusion of the right ICA. The                CCA appears occluded. Known occlusions of the proximal CCA-distal                ICA. Trickle flow noted in the ECA, possible near-occlusion. Left Carotid: Velocities in the left ICA are consistent with a 1-39% stenosis. Vertebrals:  Bilateral vertebral arteries demonstrate antegrade flow. Subclavians: Normal flow hemodynamics were seen in bilateral subclavian              arteries. *See table(s) above for measurements and observations. Suggest follow up study in 12 months. Electronically signed by Ena Dawley MD on 05/18/2020 at 8:14:53 AM.    Final     Labs:  CBC: Recent Labs    04/14/20 0856 04/29/20 1026 05/20/20 1028 05/26/20 1018  WBC 5.8 4.8 4.9 6.1  HGB 10.8* 10.6* 11.4* 12.0*  HCT 33.1* 32.5* 35.3* 38.5*  PLT 197 127* 178 175    COAGS: No results for input(s): INR, APTT in the last 8760 hours.  BMP: Recent Labs    01/28/20 0844 02/11/20 1027 02/25/20 0920 03/10/20 0855 03/25/20 0739 04/14/20 0856 04/29/20 1026 05/20/20 1028 05/26/20 1018  NA 137 137 136 140   < > 138 139 138 137  K 4.1 4.4 4.2 4.2   < > 4.2 4.4 4.5 4.5  CL 106 104 106 106   < > 105 108 106 105  CO2 26 28 26 27    < > 27 26 25 23    GLUCOSE 105* 103* 103* 91   < > 101* 104* 109* 116*  BUN 14 12 17 13    < > 15 14 17 19   CALCIUM 9.2 9.6 8.6* 8.9   < > 8.8* 8.6* 9.1 9.0  CREATININE 0.80 0.79 0.78 0.84   < > 0.77 0.83 0.85 0.86  GFRNONAA >60 >60 >60 >60   < > >60 >60 >60 >60  GFRAA >60 >60 >60 >60  --   --   --   --   --    < > = values in this interval not displayed.    LIVER FUNCTION TESTS: Recent Labs    03/25/20 0739 04/14/20 0856 04/29/20 1026 05/20/20 1028  BILITOT 0.5 0.5 0.5 0.4  AST 37 35 45* 45*  ALT 32 36 38 38  ALKPHOS 73 68 68 67  PROT 5.9* 5.8* 6.0* 6.3*  ALBUMIN 3.3* 3.4* 3.5 3.6    TUMOR MARKERS: No results for input(s): AFPTM, CEA, CA199, CHROMGRNA in the last 8760 hours.  Assessment and Plan:  Luis [name] is a 79 yo male with metastatic colorectal carcinoma.    He has ECOG of 1, and the 3 liver lesions are decreased  over time secondary to his chemotherapy treatment.   I had a discussion with Luis [name] and his wife regarding the natural history of liver mets and CRCA, and the options that Vascular/Interventional Radiology can offer from standpoint of locoregional therapy, which is typically palliative in the setting of non-resectable disease, as in this case.   I did introduce the concept of arterial embolization, with options including drug-eluting beads (irinotecan) as well as radio-therapy (y90), but focused our discussion on Image-guided tissue ablation such as microwave ablation.    I had a lengthy discussion with him regarding the logistics of image guided ablation, including GETA, 23 hour observation, the anticipated time of 2-4 hour in-room time, and the risk/benefit analysis.  Specific risks include bleeding, infection, risk of anesthesia, organ injury/dysfunction, need for further procedure, hospitalization, injury to local structures, recurrence, cardiopulmonary collapse, death.   I did let him know that I have discussed his case with Dr. Benay Spice today, and that it is our impression,  as well as the impression of the tumor board (where his case was presented this morning), that loco-regional therapy targeting the 2 right liver lesions is reasonable, with observation strategy for the left liver lesion.  SBRT could also play a role.    For now, our treatment plan is for image guided tissue ablation of the 2 right liver lesions, and active surveillance of the left lesion.   He understands and would like to proceed.   Plan: - CT guided ablation of 2 right sided liver mets, with GETA at [facility], with Dr. Earleen Newport   Thank you for this interesting consult.  I greatly enjoyed meeting Luis [name] and look forward to participating in their care.  A copy of this report was sent to the requesting provider on this date.  Electronically Signed: Corrie Mckusick 05/26/2020, 3:49 PM   I spent a total of  60 Minutes   in remote  clinical consultation, greater than 50% of which was counseling/coordinating care for metastatic colorectal carcinoma, possible CT guided tissue ablation.    Visit type: Audio and video (webex).   Alternative for in-person consultation at [facility], [address] [place], [place], [place]. This visit type was conducted due to national recommendations for restrictions regarding the COVID-19 Pandemic (e.g. social distancing).  This format is felt to be most appropriate for this patient at this time.  All issues noted in this document were discussed and addressed.

## 2020-05-28 ENCOUNTER — Other Ambulatory Visit: Payer: Self-pay | Admitting: Interventional Cardiology

## 2020-05-31 DIAGNOSIS — Z947 Corneal transplant status: Secondary | ICD-10-CM | POA: Diagnosis not present

## 2020-05-31 DIAGNOSIS — Z961 Presence of intraocular lens: Secondary | ICD-10-CM | POA: Diagnosis not present

## 2020-05-31 DIAGNOSIS — H524 Presbyopia: Secondary | ICD-10-CM | POA: Diagnosis not present

## 2020-05-31 DIAGNOSIS — H52209 Unspecified astigmatism, unspecified eye: Secondary | ICD-10-CM | POA: Diagnosis not present

## 2020-06-06 ENCOUNTER — Other Ambulatory Visit: Payer: Self-pay | Admitting: Oncology

## 2020-06-07 ENCOUNTER — Other Ambulatory Visit: Payer: Medicare PPO

## 2020-06-07 ENCOUNTER — Inpatient Hospital Stay: Payer: Medicare PPO

## 2020-06-07 ENCOUNTER — Other Ambulatory Visit: Payer: Self-pay

## 2020-06-07 ENCOUNTER — Inpatient Hospital Stay: Payer: Medicare PPO | Admitting: Nurse Practitioner

## 2020-06-07 ENCOUNTER — Ambulatory Visit: Payer: Medicare PPO | Admitting: Nurse Practitioner

## 2020-06-07 ENCOUNTER — Encounter: Payer: Self-pay | Admitting: Nurse Practitioner

## 2020-06-07 ENCOUNTER — Ambulatory Visit: Payer: Medicare PPO

## 2020-06-07 VITALS — BP 149/72 | HR 70 | Temp 97.8°F | Resp 15 | Ht 71.0 in | Wt 244.4 lb

## 2020-06-07 DIAGNOSIS — Z95828 Presence of other vascular implants and grafts: Secondary | ICD-10-CM

## 2020-06-07 DIAGNOSIS — G620 Drug-induced polyneuropathy: Secondary | ICD-10-CM | POA: Diagnosis not present

## 2020-06-07 DIAGNOSIS — D696 Thrombocytopenia, unspecified: Secondary | ICD-10-CM | POA: Diagnosis not present

## 2020-06-07 DIAGNOSIS — C180 Malignant neoplasm of cecum: Secondary | ICD-10-CM

## 2020-06-07 DIAGNOSIS — Z8719 Personal history of other diseases of the digestive system: Secondary | ICD-10-CM | POA: Diagnosis not present

## 2020-06-07 DIAGNOSIS — C787 Secondary malignant neoplasm of liver and intrahepatic bile duct: Secondary | ICD-10-CM | POA: Diagnosis not present

## 2020-06-07 DIAGNOSIS — I2510 Atherosclerotic heart disease of native coronary artery without angina pectoris: Secondary | ICD-10-CM | POA: Diagnosis not present

## 2020-06-07 LAB — CMP (CANCER CENTER ONLY)
ALT: 38 U/L (ref 0–44)
AST: 43 U/L — ABNORMAL HIGH (ref 15–41)
Albumin: 3.7 g/dL (ref 3.5–5.0)
Alkaline Phosphatase: 68 U/L (ref 38–126)
Anion gap: 6 (ref 5–15)
BUN: 16 mg/dL (ref 8–23)
CO2: 27 mmol/L (ref 22–32)
Calcium: 8.9 mg/dL (ref 8.9–10.3)
Chloride: 106 mmol/L (ref 98–111)
Creatinine: 0.83 mg/dL (ref 0.61–1.24)
GFR, Estimated: >60 mL/min (ref >60)
Glucose, Bld: 105 mg/dL — ABNORMAL HIGH (ref 70–99)
Potassium: 4.2 mmol/L (ref 3.5–5.1)
Sodium: 139 mmol/L (ref 135–145)
Total Bilirubin: 0.5 mg/dL (ref 0.3–1.2)
Total Protein: 6.3 g/dL — ABNORMAL LOW (ref 6.5–8.1)

## 2020-06-07 LAB — CBC WITH DIFFERENTIAL (CANCER CENTER ONLY)
Abs Immature Granulocytes: 0.01 10*3/uL (ref 0.00–0.07)
Basophils Absolute: 0 10*3/uL (ref 0.0–0.1)
Basophils Relative: 1 %
Eosinophils Absolute: 0.8 10*3/uL — ABNORMAL HIGH (ref 0.0–0.5)
Eosinophils Relative: 12 %
HCT: 37.6 % — ABNORMAL LOW (ref 39.0–52.0)
Hemoglobin: 12.1 g/dL — ABNORMAL LOW (ref 13.0–17.0)
Immature Granulocytes: 0 %
Lymphocytes Relative: 10 %
Lymphs Abs: 0.6 10*3/uL — ABNORMAL LOW (ref 0.7–4.0)
MCH: 30.2 pg (ref 26.0–34.0)
MCHC: 32.2 g/dL (ref 30.0–36.0)
MCV: 93.8 fL (ref 80.0–100.0)
Monocytes Absolute: 0.7 10*3/uL (ref 0.1–1.0)
Monocytes Relative: 12 %
Neutro Abs: 4 10*3/uL (ref 1.7–7.7)
Neutrophils Relative %: 65 %
Platelet Count: 138 10*3/uL — ABNORMAL LOW (ref 150–400)
RBC: 4.01 MIL/uL — ABNORMAL LOW (ref 4.22–5.81)
RDW: 17.1 % — ABNORMAL HIGH (ref 11.5–15.5)
WBC Count: 6.1 10*3/uL (ref 4.0–10.5)
nRBC: 0 % (ref 0.0–0.2)

## 2020-06-07 LAB — MAGNESIUM: Magnesium: 1.8 mg/dL (ref 1.7–2.4)

## 2020-06-07 MED ORDER — SODIUM CHLORIDE 0.9% FLUSH
10.0000 mL | INTRAVENOUS | Status: DC | PRN
  Administered 2020-06-07: 10 mL
  Filled 2020-06-07: qty 10

## 2020-06-07 MED ORDER — SODIUM CHLORIDE 0.9% FLUSH
10.0000 mL | INTRAVENOUS | Status: DC | PRN
Start: 2020-06-07 — End: 2020-06-07
  Administered 2020-06-07: 10 mL
  Filled 2020-06-07: qty 10

## 2020-06-07 MED ORDER — HEPARIN SOD (PORK) LOCK FLUSH 100 UNIT/ML IV SOLN
500.0000 [IU] | Freq: Once | INTRAVENOUS | Status: AC | PRN
  Administered 2020-06-07: 500 [IU]
  Filled 2020-06-07: qty 5

## 2020-06-07 NOTE — Progress Notes (Signed)
[facility] OFFICE PROGRESS NOTE   Diagnosis: Colon cancer  INTERVAL HISTORY:   Luis [name] returns as scheduled.  He was referred to interventional radiology to consider ablation of liver lesions at the time of his last visit on 05/20/2020.  He was seen in the emergency department 05/26/2020 for evaluation of chest pain.  Evaluation in the emergency department was unremarkable, symptoms resolved.  He has follow-up with Dr. Irish Lack on 06/16/2020.  He has occasional chest pain.  No nausea or vomiting.  No constipation or diarrhea.  He has noted intermittent leg swelling over the past 3 to 4 weeks.  The swelling improves overnight.  He change in baseline dyspnea.  Objective:  Vital signs in last 24 hours:  Blood pressure (!) 149/72, pulse 70, temperature 97.8 F (36.6 C), temperature source Tympanic, resp. rate 15, height $RemoveBe'5\' 11"'aACnMdvVK$  (1.803 m), weight 244 lb 6.4 oz (110.9 kg), SpO2 98 %.    Resp: Lungs clear bilaterally. Cardio: Regular rate and rhythm.  No neck vein distention. GI: No hepatomegaly. Vascular: Pitting edema at the lower legs bilaterally. Port-A-Cath without erythema.  Lab Results:  Lab Results  Component Value Date   WBC 6.1 06/07/2020   HGB 12.1 (L) 06/07/2020   HCT 37.6 (L) 06/07/2020   MCV 93.8 06/07/2020   PLT 138 (L) 06/07/2020   NEUTROABS 4.0 06/07/2020    Imaging:  No results found.  Medications: I have reviewed the patient's current medications.  Assessment/Plan: 1. Adenocarcinoma of the cecum, stage IIA(T3N0), status post a right colectomy 07/26/2017 ? 0/16 lymph nodes positive, no lymphovascular invasion, perineural invasion present ? MSI-stable, no loss of mismatch repair protein expression ? Surveillance colonoscopy 07/26/2018-no polyps found ? Elevated CEA June 2020, persistently elevated August 2020 ? CTs 02/07/2019-bilateral liver metastases, no extrahepatic metastatic disease, changes of sarcoidosis in the chest ? Biopsy liver  lesion 03/04/2019-metastatic adenocarcinoma, with morphology consistent with metastatic colonic adenocarcinoma, RAS WT ? Cycle 1 FOLFOX 03/12/2019 ? Cycle 2 FOLFOX 03/26/2019, oxaliplatin dose reduced secondary to thrombocytopenia ? Cycle 3 FOLFOX 04/09/2019, oxaliplatin further dose reduced and aspirin held secondary to thrombocytopenia ? Cycle 4 FOLFOX 04/23/2019 ? Cycle 5 FOLFOX 05/07/2019 ? CTs 05/23/2019-decrease in liver lesions. No new or progressive findings. ? Cycle 6 FOLFOX 05/27/2019 ? Cycle 7 FOLFOX 06/14/2019 ? Cycle 8 FOLFOX 07/02/2019-Udenyca held ? Cycle 9 FOLFOX 07/16/2019-Udenyca held ? Cycle 10 FOLFOX 07/30/2019-Udenyca held ? CT abdomen/pelvis 08/13/2019-mild improvement in previously noted liver lesions ? MRI abdomen 08/13/2019-3 liver lesions ? Cycle 1 FOLFIRI/Avastin 09/17/2019 ? Cycle 2 FOLFIRI/Avastin 10/01/2019 ? Cycle 3 FOLFIRI/Avastin 10/15/2019 ? Cycle 4 FOLFIRI/Avastin 11/05/2019 ? Cycle 5 FOLFIRI/Avastin 11/19/2019 ? CTs 11/27/2019-stable liver lesions; no new liver lesions. No other evidence of metastatic disease in the abdomen or pelvis. ? Cycle 1 FOLFIRI/Panitumumab 12/03/2019 ? Cycle 2 FOLFIRI/Panitumumab 12/17/2019 (Panitumumab dose reduced due to rash) ? Cycle 3 FOLFIRI/Panitumumab 01/01/2020 ? Cycle 4 FOLFIRI/Panitumumab 01/14/2020 ? Cycle 5 FOLFIRI/Panitumumab 01/28/2020 ? CT abdomen/pelvis 02/06/2020-decreased size of right hepatic lesions, subtle lesion in the lateral left hepatic lobe-not clearly identified, noevidence of disease progression ? Cycle 6 FOLFIRI/Panitumumab 02/11/2020 ? Cycle 7 FOLFIRI/Panitumumab 03/10/2020 ? Cycle 8 FOLFIRI/Panitumumab 03/26/2020 ? Cycle 9 FOLFIRI/Panitumumab 04/14/2020 ? CT abdomen/pelvis 04/26/2020-mild enlargement of 2 liver lesions on initial report, repeat review with radiology consistent with stable disease ? Cycle 10 FOLFIRI/Panitumumab 04/29/2020 ? MRI liver 05/19/2020-right hepatic lobe lesions diminished in size when compared to  the study of March 2021.  Lesion in hepatic subsegment VI with very limited  assessment as well due to adjacent bowel. ? 05/20/2020 referred to interventional radiology to consider ablation of liver lesions 2. History of colon polyps 3. Microcytic anemia January 2019 4. Sarcoidosis 5. Coronary artery disease 6. Port-A-Cath placement, interventional radiology, 03/04/2019 7. Thrombocytopenia, mild thrombocytopenia predating chemotherapy 8. Oxaliplatin neuropathy 9. Dyspnea on exertion-CT chest 02/25/2020 negative for PE, infection, pneumonitis; scheduled for PFTs 03/10/2020, followed by pulmonary   Disposition: Mr. Homan appears unchanged.  He has seen Dr. Earleen Newport with the plan for CT-guided ablation of 2 right-sided liver metastases, observation for the left liver lesion.  Chemotherapy remains on hold.  We will see him in follow-up in 4 weeks.  For the leg edema he will try elevation and support stockings.  Plan reviewed with Dr. Benay Spice.    Ned Card ANP/GNP-BC   06/07/2020  12:01 PM

## 2020-06-08 ENCOUNTER — Telehealth: Payer: Self-pay | Admitting: Nurse Practitioner

## 2020-06-08 NOTE — Telephone Encounter (Signed)
Scheduled appointments per 12/27 los. Spoke to patient who is aware of appointments date and times.  

## 2020-06-09 ENCOUNTER — Inpatient Hospital Stay: Payer: Medicare PPO

## 2020-06-10 DIAGNOSIS — Z20822 Contact with and (suspected) exposure to covid-19: Secondary | ICD-10-CM | POA: Diagnosis not present

## 2020-06-10 DIAGNOSIS — J029 Acute pharyngitis, unspecified: Secondary | ICD-10-CM | POA: Diagnosis not present

## 2020-06-10 DIAGNOSIS — R6889 Other general symptoms and signs: Secondary | ICD-10-CM | POA: Diagnosis not present

## 2020-06-14 ENCOUNTER — Telehealth: Payer: Self-pay | Admitting: Oncology

## 2020-06-14 NOTE — Telephone Encounter (Signed)
Rescheduled appointments on 1/28 per provider PAL request. Called patient, no answer. Left message with updated appointments date and times.

## 2020-06-15 ENCOUNTER — Other Ambulatory Visit: Payer: Self-pay | Admitting: Oncology

## 2020-06-15 DIAGNOSIS — C180 Malignant neoplasm of cecum: Secondary | ICD-10-CM

## 2020-06-16 ENCOUNTER — Ambulatory Visit: Payer: Medicare PPO | Admitting: Interventional Cardiology

## 2020-06-16 ENCOUNTER — Encounter: Payer: Self-pay | Admitting: Interventional Cardiology

## 2020-06-16 ENCOUNTER — Other Ambulatory Visit: Payer: Self-pay

## 2020-06-16 VITALS — BP 150/50 | HR 78 | Ht 70.0 in | Wt 246.0 lb

## 2020-06-16 DIAGNOSIS — I10 Essential (primary) hypertension: Secondary | ICD-10-CM | POA: Diagnosis not present

## 2020-06-16 DIAGNOSIS — I2510 Atherosclerotic heart disease of native coronary artery without angina pectoris: Secondary | ICD-10-CM | POA: Diagnosis not present

## 2020-06-16 DIAGNOSIS — I6521 Occlusion and stenosis of right carotid artery: Secondary | ICD-10-CM | POA: Diagnosis not present

## 2020-06-16 DIAGNOSIS — R0600 Dyspnea, unspecified: Secondary | ICD-10-CM | POA: Diagnosis not present

## 2020-06-16 DIAGNOSIS — R0609 Other forms of dyspnea: Secondary | ICD-10-CM

## 2020-06-16 DIAGNOSIS — Z955 Presence of coronary angioplasty implant and graft: Secondary | ICD-10-CM

## 2020-06-16 DIAGNOSIS — E782 Mixed hyperlipidemia: Secondary | ICD-10-CM | POA: Diagnosis not present

## 2020-06-16 MED ORDER — FUROSEMIDE 40 MG PO TABS: 40.0000 mg | ORAL_TABLET | Freq: Every day | ORAL | 3 refills | Status: DC

## 2020-06-16 NOTE — Patient Instructions (Signed)
Medication Instructions:  Your physician has recommended you make the following change in your medication:   START: furosemide (lasix) 40 mg tablet: Take 1 tablet by mouth once a day  *If you need a refill on your cardiac medications before your next appointment, please call your pharmacy*   Lab Work: Your physician recommends that you return for lab work in: 1 week for BMET  If you have labs (blood work) drawn today and your tests are completely normal, you will receive your results only by: [name] MyChart Message (if you have MyChart) OR . A paper copy in the mail If you have any lab test that is abnormal or we need to change your treatment, we will call you to review the results.   Testing/Procedures: None   Follow-Up: At [facility], you and your health needs are our priority.  As part of our continuing mission to provide you with exceptional heart care, we have created designated Provider Care Teams.  These Care Teams include your primary Cardiologist (physician) and Advanced Practice Providers (APPs -  Physician Assistants and Nurse Practitioners) who all work together to provide you with the care you need, when you need it.  We recommend signing up for the patient portal called "MyChart".  Sign up information is provided on this After Visit Summary.  MyChart is used to connect with patients for Virtual Visits (Telemedicine).  Patients are able to view lab/test results, encounter notes, upcoming appointments, etc.  Non-urgent messages can be sent to your provider as well.   To learn more about what you can do with MyChart, go to [email or web address].    Your next appointment:   6 month(s)  The format for your next appointment:   In Person  Provider:   You may see Lance Muss, MD or one of the following Advanced Practice Providers on your designated Care Team:    Ronie Spies, PA-C  Jacolyn Reedy, PA-C    Other Instructions  High-Fiber Diet Fiber, also called  dietary fiber, is a type of carbohydrate that is found in fruits, vegetables, whole grains, and beans. A high-fiber diet can have many health benefits. Your health care provider may recommend a high-fiber diet to help:  Prevent constipation. Fiber can make your bowel movements more regular.  Lower your cholesterol.  Relieve the following conditions: ? Swelling of veins in the anus (hemorrhoids). ? Swelling and irritation (inflammation) of specific areas of the digestive tract (uncomplicated diverticulosis). ? A problem of the large intestine (colon) that sometimes causes pain and diarrhea (irritable bowel syndrome, IBS).  Prevent overeating as part of a weight-loss plan.  Prevent heart disease, type 2 diabetes, and certain cancers. What is my plan? The recommended daily fiber intake in grams (g) includes:  38 g for men age 41 or younger.  30 g for men over age 41.  25 g for women age 51 or younger.  21 g for women over age 96. You can get the recommended daily intake of dietary fiber by:  Eating a variety of fruits, vegetables, grains, and beans.  Taking a fiber supplement, if it is not possible to get enough fiber through your diet. What do I need to know about a high-fiber diet?  It is better to get fiber through food sources rather than from fiber supplements. There is not a lot of research about how effective supplements are.  Always check the fiber content on the nutrition facts label of any prepackaged food. Look for foods that contain  5 g of fiber or more per serving.  Talk with a diet and nutrition specialist (dietitian) if you have questions about specific foods that are recommended or not recommended for your medical condition, especially if those foods are not listed below.  Gradually increase how much fiber you consume. If you increase your intake of dietary fiber too quickly, you may have bloating, cramping, or gas.  Drink plenty of water. Water helps you to digest  fiber. What are tips for following this plan?  Eat a wide variety of high-fiber foods.  Make sure that half of the grains that you eat each day are whole grains.  Eat breads and cereals that are made with whole-grain flour instead of refined flour or white flour.  Eat brown rice, bulgur wheat, or millet instead of white rice.  Start the day with a breakfast that is high in fiber, such as a cereal that contains 5 g of fiber or more per serving.  Use beans in place of meat in soups, salads, and pasta dishes.  Eat high-fiber snacks, such as berries, raw vegetables, nuts, and popcorn.  Choose whole fruits and vegetables instead of processed forms like juice or sauce. What foods can I eat?  Fruits Berries. Pears. Apples. Oranges. Avocado. Prunes and raisins. Dried figs. Vegetables Sweet potatoes. Spinach. Kale. Artichokes. Cabbage. Broccoli. Cauliflower. Green peas. Carrots. Squash. Grains Whole-grain breads. Multigrain cereal. Oats and oatmeal. Brown rice. Barley. Bulgur wheat. Valier. Quinoa. Bran muffins. Popcorn. Rye wafer crackers. Meats and other proteins Navy, kidney, and pinto beans. Soybeans. Split peas. Lentils. Nuts and seeds. Dairy Fiber-fortified yogurt. Beverages Fiber-fortified soy milk. Fiber-fortified orange juice. Other foods Fiber bars. The items listed above may not be a complete list of recommended foods and beverages. Contact a dietitian for more options. What foods are not recommended? Fruits Fruit juice. Cooked, strained fruit. Vegetables Fried potatoes. Canned vegetables. Well-cooked vegetables. Grains White bread. Pasta made with refined flour. White rice. Meats and other proteins Fatty cuts of meat. Fried chicken or fried fish. Dairy Milk. Yogurt. Cream cheese. Sour cream. Fats and oils Butters. Beverages Soft drinks. Other foods Cakes and pastries. The items listed above may not be a complete list of foods and beverages to avoid. Contact a  dietitian for more information. Summary  Fiber is a type of carbohydrate. It is found in fruits, vegetables, whole grains, and beans.  There are many health benefits of eating a high-fiber diet, such as preventing constipation, lowering blood cholesterol, helping with weight loss, and reducing your risk of heart disease, diabetes, and certain cancers.  Gradually increase your intake of fiber. Increasing too fast can result in cramping, bloating, and gas. Drink plenty of water while you increase your fiber.  The best sources of fiber include whole fruits and vegetables, whole grains, nuts, seeds, and beans. This information is not intended to replace advice given to you by your health care provider. Make sure you discuss any questions you have with your health care provider. Document Revised: 04/02/2017 Document Reviewed: 04/02/2017 Elsevier Patient Education  2020 [employer].

## 2020-06-16 NOTE — Progress Notes (Signed)
Cardiology Office Note   Date:  06/16/2020   ID:  Luis [name], DOB 23-Sep-1940, MRN [id number]  PCP:  [name], MD    No chief complaint on file.  CAD  Wt Readings from Last 3 Encounters:  06/16/20 246 lb (111.6 kg)  06/07/20 244 lb 6.4 oz (110.9 kg)  05/26/20 239 lb 6.7 oz (108.6 kg)       History of Present Illness: Luis [name] is a 80 y.o. male   with history of CADstatus post DES to the diagonal 08/2014 ramus intermedius was totally occluded at the time, eventually underwent CTO PCI with overlapping DES x2 to the ramus 02/2015. Chest pain 05/2016 relieved with nitroglycerin and treated with Ranexa but has been off. NST 12/2017 normal LVEF normal study. Found to have a totally occluded right carotid on Dopplers 04/2018 but other vessels open. HTN, HLD,Also has adenocarcinoma of the colon status post colectomy.  Patient addedto office schedule on 7/15/20for increased dyspnea on exertion and left arm pain relieved with rest.Was walking 3 miles daily but had tocut back to 1 1/2 miles daily because of shortness of breath. No chest tightness. Similar to when he had his stents placed. Didn't have chest tightness then. Hadgained 10-12 lbs.Given his ongoing symptoms he was referred for outpatient cardiac cath.  Patient underwent cardiac cath 01/02/2019 with DES to the LAD and balloon angioplasty to the diagonal bifurcation. Plan for aspirin and Plavix for at least 6 months.  He had liver biopsy.He has had some CP with eating certin foods and with exertion but improved with NTG and amlodipine.  Sx resolved.  He got chemotherapy for adenoCA of colon with liver Mets, then Surgery planned once tumor had shrunk.  He decided not to have surgery, so he was treated with chemotherapy and will have an ablation of the tumor on the liver.   Had a recent episode of chest pain with negative w/u in ER.    Pain was different from his prior angina.  Troponin was negative.   ECG was unchanged.  He was seen by Dr. Martinique and sent home.  Plan was to consider repeat heart cath if symptoms persisted.  Since then, he has felt well.  Walking is limited by spinal stenosis.    Breathing better with Breo.     Past Medical History:  Diagnosis Date  . Adenocarcinoma of cecum ([place]) 06/21/2017  . Arthritis    "mid back; hands; knees" (02/24/2015)  . Basal cell carcinoma    left shoulder; mid chest; right eyelid (02/24/2015)  . CAD (coronary artery disease)    a. 08/2014 Cath/PCI: LM nl, LAD 30p, D1 95 (2.25x12 Resolute Integrity DES), LCX small, RI 100 (attempted PCI) - branches fill via L->L collats, RCA dominant, nl, RPDA/PLA nl, EF 60^. b. 02/24/2015 PCI CTO of Ramus DES x2.  . Chronic bronchitis (H[facility])    hx  . Diverticulosis 05/2005  . Elevated lipase   . Fuchs' corneal dystrophy   . History of adenomatous polyp of colon 05/2005   8 mm adenoma  . History of blood transfusion    "related to some of my surgeries"  . Hyperlipidemia   . Hypertension   . Iron deficiency anemia due to chronic blood loss 06/21/2017  . Prostate cancer ([place]) 2011   S/P seed implant  . Sarcoidosis   . Thrombocytopenia ([place])    a. Noted on prior labs, unclear of what w/u done.  . Trifascicular block  Past Surgical History:  Procedure Laterality Date  . BASAL CELL CARCINOMA EXCISION Left    shoulder  . CARDIAC CATHETERIZATION N/A 02/24/2015   Procedure: Coronary/Bypass Graft CTO Intervention;  Surgeon: Jettie Booze, MD;  Location: [facility];  Service: Cardiovascular;  Laterality: N/A;  . CARDIAC CATHETERIZATION  02/24/2015   Procedure: Coronary/Graft Atherectomy;  Surgeon: Jettie Booze, MD;  Location: [facility];  Service: Cardiovascular;;  . CATARACT EXTRACTION W/ INTRAOCULAR LENS  IMPLANT, BILATERAL Bilateral ~ 2011-2012  . COLONOSCOPY W/ POLYPECTOMY  06/08/2005 and 10/18/2010   8 mm adenoma 2006, none 2012. Diverticulosis and internal hemorrhoids.  .  CORNEAL TRANSPLANT Bilateral ~ 2011-2012   "@ same time as cataract OR"  . CORONARY ANGIOPLASTY WITH STENT PLACEMENT  08/2014; 02/24/2015   "1 stent + 1 stent"  . CORONARY STENT INTERVENTION N/A 01/02/2019   Procedure: CORONARY STENT INTERVENTION;  Surgeon: Jettie Booze, MD;  Location: [facility];  Service: Cardiovascular;  Laterality: N/A;  . EYE SURGERY    . FINGER SURGERY Right 2014   "reattached middle finger"  . INSERTION PROSTATE RADIATION SEED  ~ 2011  . IR IMAGING GUIDED PORT INSERTION  03/04/2019  . IR RADIOLOGIST EVAL & MGMT  05/26/2020  . IR US GUIDE BX ASP/DRAIN  03/04/2019  . LAPAROSCOPIC CHOLECYSTECTOMY  2015  . LAPAROSCOPIC PARTIAL COLECTOMY N/A 07/26/2017   Procedure: LAPAROSCOPIC PARTIAL COLECTOMY, EXCISION SCROTAL CYST;  Surgeon: Greer Pickerel, MD;  Location: [facility];  Service: General;  Laterality: N/A;  . LEFT HEART CATH AND CORONARY ANGIOGRAPHY N/A 01/02/2019   Procedure: LEFT HEART CATH AND CORONARY ANGIOGRAPHY;  Surgeon: Jettie Booze, MD;  Location: [facility];  Service: Cardiovascular;  Laterality: N/A;  . LEFT HEART CATHETERIZATION WITH CORONARY ANGIOGRAM N/A 08/12/2014   Procedure: LEFT HEART CATHETERIZATION WITH CORONARY ANGIOGRAM;  Surgeon: Blane Ohara, MD;  Location: [facility];  Service: Cardiovascular;  Laterality: N/A;  . LYMPH NODE BIOPSY     mid chest  . MOHS SURGERY Right ~ 2011   "eyelid; for basal cell"  . THOROCOTOMY WITH LOBECTOMY Right ~ 1991   partial removal right lung  . TONSILLECTOMY  ~ 1950     Current Outpatient Medications  Medication Sig Dispense Refill  . amLODipine (NORVASC) 5 MG tablet TAKE 1 TABLET BY MOUTH TWICE A DAY 180 tablet 3  . CANNABIDIOL PO Take 12 drops by mouth 2 (two) times a day. CBD OIL    . Cholecalciferol (VITAMIN D3) 2000 units capsule Take 2,000 Units by mouth every evening.     . clopidogrel (PLAVIX) 75 MG tablet Take 1 tablet (75 mg total) by mouth daily. 90 tablet 0  . doxycycline  (VIBRA-TABS) 100 MG tablet TAKE 1 TABLET BY MOUTH TWICE A DAY 60 tablet 4  . fluticasone (CUTIVATE) 0.05 % cream APPLY TO AFFECTED AREAS ON FACE 2 TIMES DAILY 30 g 2  . fluticasone furoate-vilanterol (BREO ELLIPTA) 200-25 MCG/INH AEPB Inhale 1 puff into the lungs daily. 180 each 11  . gabapentin (NEURONTIN) 100 MG capsule Take 2 capsules (200 mg total) by mouth at bedtime. 60 capsule 1  . magnesium oxide (MAG-OX) 400 MG tablet Take 400 mg by mouth daily.     . metoprolol tartrate (LOPRESSOR) 25 MG tablet TAKE 1/2 TABLET BY MOUTH TWICE A DAY 90 tablet 2  . nitroGLYCERIN (NITROSTAT) 0.4 MG SL tablet PLACE 1 TABLET UNDER THE TONGUE EVERY 5 MINUTES AS NEEDED FOR CHEST PAIN  FOR 3 DOSES 25 tablet 4  . Polyethylene Glycol 3350 (MIRALAX PO) Take 17 g by mouth daily as needed.    . prochlorperazine (COMPAZINE) 10 MG tablet Take 1 tablet (10 mg total) by mouth every 6 (six) hours as needed. 60 tablet 1  . ranolazine (RANEXA) 500 MG 12 hr tablet Take 1 tablet (500 mg total) by mouth 2 (two) times daily. Please keep upcoming appointment in Jan 2022 for future refills. Thank you 180 tablet 0  . rosuvastatin (CRESTOR) 20 MG tablet TAKE 1 TABLET BY MOUTH EVERY DAY 90 tablet 2  . traMADol (ULTRAM) 50 MG tablet Take 100 mg by mouth 2 (two) times a day.     No current facility-administered medications for this visit.    Allergies:   Patient has no known allergies.    Social History:  The patient  reports that he has been smoking cigars. He has been smoking about 0.00 packs per day for the past 59.00 years. He has never used smokeless tobacco. He reports current alcohol use of about 2.0 standard drinks of alcohol per week. He reports that he does not use drugs.   Family History:  The patient's family history includes Coronary artery disease (age of onset: 67) in his sister; Heart attack in his sister; Heart disease (age of onset: 17) in his father.    ROS:  Please see the history of present illness.    Otherwise, review of systems are positive for decreased exercise tolerance since chemo started.   All other systems are reviewed and negative.    PHYSICAL EXAM: VS:  BP (!) 150/50   Pulse 78   Ht 5\' 10"  (1.778 m)   Wt 246 lb (111.6 kg)   SpO2 98%   BMI 35.30 kg/m  , BMI Body mass index is 35.3 kg/m. GEN: Well nourished, well developed, in no acute distress  HEENT: normal  Neck: no JVD, carotid bruits, or masses Cardiac: RRR; no murmurs, rubs, or gallops,1+ bilateral LE pitting edema  Respiratory:  clear to auscultation bilaterally, normal work of breathing GI: soft, nontender, nondistended, + BS MS: no deformity or atrophy  Skin: warm and dry, no rash Neuro:  Strength and sensation are intact Psych: euthymic mood, full affect   EKG:   The ekg ordered today demonstrates    Recent Labs: 06/07/2020: ALT 38; BUN 16; Creatinine 0.83; Hemoglobin 12.1; Magnesium 1.8; Platelet Count 138; Potassium 4.2; Sodium 139   Lipid Panel    Component Value Date/Time   CHOL 155 10/13/2015 1005   TRIG 186 (H) 10/13/2015 1005   HDL 49 10/13/2015 1005   CHOLHDL 3.2 10/13/2015 1005   VLDL 37 (H) 10/13/2015 1005   LDLCALC 69 10/13/2015 1005     Other studies Reviewed: Additional studies/ records that were reviewed today with results demonstrating: hospital records reviewed; LDL 88 in 8/21.   ASSESSMENT AND PLAN:  1. CAD: Several PCI's in the past.  Had some recurrent atypical chest pain in December 2021.  Clopidogrel monotherapy.  Continue aggressive secondary prevention. 2. Colon cancer: Now going through chemotherapy. 3. Hyperlipidemia: Whole food, plant-based diet.  Continue high-dose statin.   4. Peripheral arterial disease: Known occluded carotid artery.  Continue routine Dopplers. 5. Hypertension: Low-salt diet.  130s systolic at home.  6. LE edema: Start furosemide 40 mg daily.  He wants to eat foods rich in potassium rather than take a tab.  BMet in 1 week.    Current medicines  are reviewed at length  with the patient today.  The patient concerns regarding his medicines were addressed.  The following changes have been made:  No change  Labs/ tests ordered today include:  No orders of the defined types were placed in this encounter.   Recommend 150 minutes/week of aerobic exercise Low fat, low carb, high fiber diet recommended  Disposition:   FU in 6 months    Signed, Larae Grooms, MD  06/16/2020 4:21 PM    [facility] [address], [place], [place]  [address] Phone: [phone]; Fax: [phone]

## 2020-06-18 ENCOUNTER — Telehealth: Payer: Self-pay | Admitting: *Deleted

## 2020-06-18 NOTE — Telephone Encounter (Signed)
Patient reports he has not heard from radiology yet regarding scheduling of CT guided ablation of liver masses. Also asking how long he can go without chemo before it spreads more (last tx 04/29/20). He is willing to have another treatment soon if necessary. Called Tiffany in IR: Was seen on 12/15 by Dr. Jacqualyn Posey and orders are in for the procedure. She will inform Jocelyn Lamer in clinic that patient is calling to inquire about this. Most likely still waiting for insurance approval.

## 2020-06-18 NOTE — Telephone Encounter (Signed)
Pt seen 06/16/20 will close out this encounter.

## 2020-06-18 NOTE — Telephone Encounter (Signed)
Informed patient that procedure is waiting for insurance approval. Have submitted the information and are now waiting for response. Per Dr. Benay Spice: OK to wait if procedure can be scheduled in the next few weeks.

## 2020-06-23 ENCOUNTER — Other Ambulatory Visit (HOSPITAL_COMMUNITY): Payer: Self-pay | Admitting: Interventional Radiology

## 2020-06-23 ENCOUNTER — Telehealth: Payer: Self-pay | Admitting: Interventional Cardiology

## 2020-06-23 DIAGNOSIS — C189 Malignant neoplasm of colon, unspecified: Secondary | ICD-10-CM

## 2020-06-23 NOTE — Telephone Encounter (Signed)
   Primary Cardiologist: Larae Grooms, MD  Chart reviewed as part of pre-operative protocol coverage. Patient was contacted 06/23/2020 in reference to pre-operative risk assessment for pending surgery as outlined below.  Luis [name] was last seen on 06/16/2020 by Dr. Irish Lack.  Since that day, Luis [name] has done well without chest pain or shortness of breath.  Atypical chest discomfort occurred in December 2021 has not recurred since.  He has been able to walk a mile this morning and doing woodwork in his shop without any exertional chest discomfort.  Therefore, based on ACC/AHA guidelines, the patient would be at acceptable risk for the planned procedure without further cardiovascular testing.   The patient was advised that if he develops new symptoms prior to surgery to contact our office to arrange for a follow-up visit, and he verbalized understanding.  I will route this recommendation to the requesting party via Epic fax function and remove from pre-op pool. Please call with questions.  Dr. Irish Lack, patient last PCI was in 12/2018, given lack of recurrent chest discomfort, are you okay with him start holding the Plavix for biopsy of liver mass.  Since he already took Plavix this morning and there is only 6 days between now and the time of the procedure, I have asked him to start holding the Plavix.  [place], [place] 06/23/2020, 11:41 AM

## 2020-06-23 NOTE — Progress Notes (Addendum)
COVID Vaccine Completed: x3 Date COVID Vaccine completed:  07-11-19, 07-21-19 & 01-26-20 COVID vaccine manufacturer: [place]      PCP - Daiva Eves, MD Cardiologist - Irish Lack, MD  Cardiac clearance in chart dated 06-23-20 by Almyra Deforest, PA  Chest x-ray - 05-26-20 in Epic EKG - 05-26-20 in Epic Stress Test - 01-02-18 in Epic ECHO - 05-30-16 in Epic Cardiac Cath - 01-02-19 in Epic Pacemaker/ICD device last checked:  Sleep Study - 2+ years ago, neg sleep apnea.  Done in [place] CPAP -   Fasting Blood Sugar - N/A Checks Blood Sugar _____ times a day  Blood Thinner Instructions:  Plavix 75 mg Aspirin Instructions: Last Dose:06-23-20  Anesthesia review: CAD, carotid stenosis, trifascicular block, pulmonary fibrosis, sarcoidosis, HTN  Patient denies shortness of breath, fever, cough and chest pain at PAT appointment.  Pt able to climb a flight of stairs and do housework and ADLs independently   Patient verbalized understanding of instructions that were given to them at the PAT appointment. Patient was also instructed that they will need to review over the PAT instructions again at home before surgery.

## 2020-06-23 NOTE — Telephone Encounter (Signed)
OK to hold Plavix for liver biopsy.  Restart when safe from a bleeding standpoint.

## 2020-06-23 NOTE — Telephone Encounter (Signed)
Patient calling to find out more information on his surgery.

## 2020-06-23 NOTE — Telephone Encounter (Signed)
Updated the patient, he is aware

## 2020-06-23 NOTE — Patient Instructions (Addendum)
DUE TO COVID-19 ONLY ONE VISITOR IS ALLOWED TO COME WITH YOU AND STAY IN THE WAITING ROOM ONLY DURING PRE OP AND PROCEDURE.   IF YOU WILL BE ADMITTED INTO THE HOSPITAL YOU ARE ALLOWED ONE SUPPORT PERSON DURING VISITATION HOURS ONLY (10AM -8PM)    The support person may change daily.  The support person must pass our screening, gel in and out, and wear a mask at all times, including in the patients room.  Patients must also wear a mask when staff or their support person are in the room.   COVID SWAB TESTING MUST BE COMPLETED ON:   Monday, 06-28-20 @ 2:45 PM   [address] [place], [place] [address]  (Must self quarantine after testing. Follow instructions on handout.)   Your procedure is scheduled on:   Wednesday, 06-30-20   Report to [facility] Main  Entrance    Report to admitting at 10:00 AM   Call this number if you have problems the morning of surgery [phone]   Do not eat food or drink liquids :After Midnight.    Oral Hygiene is also important to reduce your risk of infection.                                    Remember - BRUSH YOUR TEETH THE MORNING OF SURGERY WITH YOUR REGULAR TOOTHPASTE   Do NOT smoke after Midnight   Take these medicines the morning of surgery with A SIP OF WATER:   Amlodipine, Doxycycline, Metoprolol, Rosuvastatin, Ranexa                                You may not have any metal on your body including  jewelry, and body piercings             Do not wear  lotions, powders, perfumes/cologne, or deodorant             Men may shave face and neck.   Do not bring valuables to the hospital. [place].   Contacts, dentures or bridgework may not be worn into surgery.   Bring small overnight bag day of surgery.     Special Instructions: Bring a copy of your healthcare power of attorney and living will documents         the day of surgery if you haven't scanned them in before.              Please read  over the following fact sheets you were given: IF YOU HAVE QUESTIONS ABOUT YOUR PRE OP INSTRUCTIONS PLEASE CALL [phone]   [facility] - Preparing for Surgery Before surgery, you can play an important role.  Because skin is not sterile, your skin needs to be as free of germs as possible.  You can reduce the number of germs on your skin by washing with CHG (chlorahexidine gluconate) soap before surgery.  CHG is an antiseptic cleaner which kills germs and bonds with the skin to continue killing germs even after washing. Please DO NOT use if you have an allergy to CHG or antibacterial soaps.  If your skin becomes reddened/irritated stop using the CHG and inform your nurse when you arrive at Short Stay. Do not shave (including legs and underarms) for at least 48 hours prior to the first CHG shower.  You may shave your face/neck.  Please follow these instructions carefully:  1.  Shower with CHG Soap the night before surgery and the  morning of surgery.  2.  If you choose to wash your hair, wash your hair first as usual with your normal  shampoo.  3.  After you shampoo, rinse your hair and body thoroughly to remove the shampoo.                             4.  Use CHG as you would any other liquid soap.  You can apply chg directly to the skin and wash.  Gently with a scrungie or clean washcloth.  5.  Apply the CHG Soap to your body ONLY FROM THE NECK DOWN.   Do   not use on face/ open                           Wound or open sores. Avoid contact with eyes, ears mouth and   genitals (private parts).                       Wash face,  Genitals (private parts) with your normal soap.             6.  Wash thoroughly, paying special attention to the area where your    surgery  will be performed.  7.  Thoroughly rinse your body with warm water from the neck down.  8.  DO NOT shower/wash with your normal soap after using and rinsing off the CHG Soap.                9.  Pat yourself dry with a clean towel.             10.  Wear clean pajamas.            11.  Place clean sheets on your bed the night of your first shower and do not  sleep with pets. Day of Surgery : Do not apply any lotions/deodorants the morning of surgery.  Please wear clean clothes to the hospital/surgery center.  FAILURE TO FOLLOW THESE INSTRUCTIONS MAY RESULT IN THE CANCELLATION OF YOUR SURGERY  PATIENT SIGNATURE_________________________________  NURSE SIGNATURE__________________________________  ________________________________________________________________________

## 2020-06-23 NOTE — Telephone Encounter (Signed)
   [facility] Pre-operative Risk Assessment    HEARTCARE STAFF: - Please ensure there is not already an duplicate clearance open for this procedure. - Under Visit Info/Reason for Call, type in Other and utilize the format Clearance MM/DD/YY or Clearance TBD. Do not use dashes or single digits. - If request is for dental extraction, please clarify the # of teeth to be extracted.  Request for surgical clearance:  1. What type of surgery is being performed? Ablation   2. When is this surgery scheduled? -06-30-20   3. What type of clearance is required (medical clearance vs. Pharmacy clearance to hold med vs. Both)? Medicine  4. Are there any medications that need to be held prior to surgery and how long?  Plavix  5. Practice name and name of physician performing surgery? Dr Corrie Mckusick 6.  7. What is the office phone number?  [phone] 8.    7.   What is the office fax number? send through Epic  8.   Anesthesia type (None, local, MAC, general) ? General   Luis [name] 06/23/2020, 10:07 AM  _________________________________________________________________   (provider comments below)

## 2020-06-23 NOTE — Progress Notes (Signed)
Please enter orders for PAT visit scheduled for 04-14-21 

## 2020-06-24 ENCOUNTER — Other Ambulatory Visit: Payer: Self-pay

## 2020-06-24 ENCOUNTER — Encounter (HOSPITAL_COMMUNITY): Payer: Self-pay

## 2020-06-24 ENCOUNTER — Encounter (HOSPITAL_COMMUNITY)
Admission: RE | Admit: 2020-06-24 | Discharge: 2020-06-24 | Disposition: A | Discharge status: Home / Self Care | Payer: Medicare PPO | Source: Ambulatory Visit | Attending: Interventional Radiology | Admitting: Interventional Radiology

## 2020-06-24 DIAGNOSIS — I2510 Atherosclerotic heart disease of native coronary artery without angina pectoris: Secondary | ICD-10-CM | POA: Insufficient documentation

## 2020-06-24 DIAGNOSIS — I10 Essential (primary) hypertension: Secondary | ICD-10-CM | POA: Insufficient documentation

## 2020-06-24 DIAGNOSIS — F17210 Nicotine dependence, cigarettes, uncomplicated: Secondary | ICD-10-CM | POA: Insufficient documentation

## 2020-06-24 DIAGNOSIS — Z955 Presence of coronary angioplasty implant and graft: Secondary | ICD-10-CM | POA: Insufficient documentation

## 2020-06-24 DIAGNOSIS — Z01812 Encounter for preprocedural laboratory examination: Secondary | ICD-10-CM | POA: Diagnosis not present

## 2020-06-24 DIAGNOSIS — Z7902 Long term (current) use of antithrombotics/antiplatelets: Secondary | ICD-10-CM | POA: Insufficient documentation

## 2020-06-24 DIAGNOSIS — C189 Malignant neoplasm of colon, unspecified: Secondary | ICD-10-CM | POA: Diagnosis not present

## 2020-06-24 DIAGNOSIS — D869 Sarcoidosis, unspecified: Secondary | ICD-10-CM | POA: Insufficient documentation

## 2020-06-24 HISTORY — DX: Pneumonia, unspecified organism: J18.9

## 2020-06-24 LAB — BASIC METABOLIC PANEL
Anion gap: 12 (ref 5–15)
BUN: 18 mg/dL (ref 8–23)
CO2: 28 mmol/L (ref 22–32)
Calcium: 9.2 mg/dL (ref 8.9–10.3)
Chloride: 100 mmol/L (ref 98–111)
Creatinine, Ser: 0.96 mg/dL (ref 0.61–1.24)
GFR, Estimated: >60 mL/min (ref >60)
Glucose, Bld: 130 mg/dL — ABNORMAL HIGH (ref 70–99)
Potassium: 4.2 mmol/L (ref 3.5–5.1)
Sodium: 140 mmol/L (ref 135–145)

## 2020-06-24 LAB — CBC
HCT: 38.6 % — ABNORMAL LOW (ref 39.0–52.0)
Hemoglobin: 13.1 g/dL (ref 13.0–17.0)
MCH: 33.1 pg (ref 26.0–34.0)
MCHC: 33.9 g/dL (ref 30.0–36.0)
MCV: 97.5 fL (ref 80.0–100.0)
Platelets: 159 10*3/uL (ref 150–400)
RBC: 3.96 MIL/uL — ABNORMAL LOW (ref 4.22–5.81)
RDW: 16.3 % — ABNORMAL HIGH (ref 11.5–15.5)
WBC: 6 10*3/uL (ref 4.0–10.5)
nRBC: 0 % (ref 0.0–0.2)

## 2020-06-24 NOTE — Anesthesia Preprocedure Evaluation (Addendum)
Anesthesia Evaluation  Patient identified by MRN, date of birth, ID band Patient awake    Reviewed: Allergy & Precautions, NPO status , Patient's Chart, lab work & pertinent test results, reviewed documented beta blocker date and time   History of Anesthesia Complications Negative for: history of anesthetic complications  Airway Mallampati: II  TM Distance: >3 FB Neck ROM: Full    Dental  (+) Dental Advisory Given, Edentulous Upper   Pulmonary Current Smoker,    Pulmonary exam normal        Cardiovascular hypertension, Pt. on medications and Pt. on home beta blockers + CAD and + Cardiac Stents  Normal cardiovascular exam  Stress Test 01/02/2018   The left ventricular ejection fraction is normal (55-65%).  Nuclear stress EF: 62%.  No T wave inversion was noted during stress.  There was no ST segment deviation noted during stress.  This is a low risk study.  The study is normal.  Normal perfusion. LVEF 62% with normal wall motion. This is a low risk study.    Neuro/Psych negative neurological ROS     GI/Hepatic Neg liver ROS, Colon Cancer   Endo/Other  negative endocrine ROS  Renal/GU negative Renal ROS     Musculoskeletal negative musculoskeletal ROS (+)   Abdominal   Peds  Hematology negative hematology ROS (+)   Anesthesia Other Findings   Reproductive/Obstetrics                                                           Anesthesia Evaluation  Patient identified by MRN, date of birth, ID band Patient awake    Reviewed: Allergy & Precautions, NPO status , Patient's Chart, lab work & pertinent test results  Airway Mallampati: II  TM Distance: >3 FB Neck ROM: Full    Dental no notable dental hx.    Pulmonary Current Smoker,    Pulmonary exam normal breath sounds clear to auscultation       Cardiovascular hypertension, + CAD, + Cardiac Stents and + DOE   Normal cardiovascular exam Rhythm:Regular Rate:Normal     Neuro/Psych negative neurological ROS  negative psych ROS   GI/Hepatic negative GI ROS, Neg liver ROS,   Endo/Other  negative endocrine ROS  Renal/GU negative Renal ROS  negative genitourinary   Musculoskeletal negative musculoskeletal ROS (+)   Abdominal   Peds negative pediatric ROS (+)  Hematology  (+) anemia ,   Anesthesia Other Findings   Reproductive/Obstetrics negative OB ROS                             Anesthesia Physical Anesthesia Plan  ASA: III  Anesthesia Plan: General   Post-op Pain Management:    Induction: Intravenous  PONV Risk Score and Plan: 2 and Ondansetron, Dexamethasone and Treatment may vary due to age or medical condition  Airway Management Planned: Oral ETT  Additional Equipment:   Intra-op Plan:   Post-operative Plan: Extubation in OR  Informed Consent: I have reviewed the patients History and Physical, chart, labs and discussed the procedure including the risks, benefits and alternatives for the proposed anesthesia with the patient or authorized representative who has indicated his/her understanding and acceptance.   Dental advisory given  Plan Discussed with: CRNA  Anesthesia Plan Comments:  Anesthesia Quick Evaluation  Anesthesia Physical Anesthesia Plan  ASA: III  Anesthesia Plan: General   Post-op Pain Management:    Induction: Intravenous  PONV Risk Score and Plan: 2 and Ondansetron and Dexamethasone  Airway Management Planned: Oral ETT  Additional Equipment:   Intra-op Plan:   Post-operative Plan: Extubation in OR  Informed Consent: I have reviewed the patients History and Physical, chart, labs and discussed the procedure including the risks, benefits and alternatives for the proposed anesthesia with the patient or authorized representative who has indicated his/her understanding and acceptance.      Dental advisory given  Plan Discussed with: CRNA and Anesthesiologist  Anesthesia Plan Comments: (See PAT note 06/24/2020, Konrad Felix, PA-C)      Anesthesia Quick Evaluation

## 2020-06-24 NOTE — Progress Notes (Signed)
Anesthesia Chart Review   Case: [id number] Date/Time: 06/30/20 1145   Procedure: IR WITH ANESTHESIA MICROWAVE ABLATION (N/A )   Anesthesia type: General   Pre-op diagnosis: COLON CANCER   [facility]   Surgeons: Corrie Mckusick, DO      DISCUSSION:80 y.o. light tobacco smoker with h/o HTN, sarcoidosis, CAD (DES 2016, DES 12/2018), colon cancer scheduled for above procedure 06/30/20 with Dr. Corrie Mckusick.   Per cardiology preoperative assessment 06/23/2020, "Chart reviewed as part of pre-operative protocol coverage. Patient was contacted 06/23/2020 in reference to pre-operative risk assessment for pending surgery as outlined below.  Luis [name] was last seen on 06/16/2020 by Dr. Irish Lack.  Since that day, Luis [name] has done well without chest pain or shortness of breath.  Atypical chest discomfort occurred in December 2021 has not recurred since.  He has been able to walk a mile this morning and doing woodwork in his shop without any exertional chest discomfort.  Therefore, based on ACC/AHA guidelines, the patient would be at acceptable risk for the planned procedure without further cardiovascular testing.   The patient was advised that if he develops new symptoms prior to surgery to contact our office to arrange for a follow-up visit, and he verbalized understanding.  I will route this recommendation to the requesting party via Epic fax function and remove from pre-op pool. Please call with questions.  Dr. Irish Lack, patient last PCI was in 12/2018, given lack of recurrent chest discomfort, are you okay with him start holding the Plavix for biopsy of liver mass.  Since he already took Plavix this morning and there is only 6 days between now and the time of the procedure, I have asked him to start holding the Plavix."  Anticipate pt can proceed with planned procedure barring acute status change.   VS: BP (!) 110/57   Pulse 71   Temp 37.1 C (Oral)   Resp 14   Ht 5\' 11"   (1.803 m)   Wt 108.9 kg   SpO2 98%   BMI 33.47 kg/m   PROVIDERS: Hamrick, Lorin Mercy, MD is PCP   Larae Grooms, MD is Cardiologist  LABS: Labs reviewed: Acceptable for surgery. (all labs ordered are listed, but only abnormal results are displayed)  Labs Reviewed  BASIC METABOLIC PANEL - Abnormal; Notable for the following components:      Result Value   Glucose, Bld 130 (*)    All other components within normal limits  CBC - Abnormal; Notable for the following components:   RBC 3.96 (*)    HCT 38.6 (*)    RDW 16.3 (*)    All other components within normal limits     IMAGES:   EKG: 05/27/2020 Rate 69 bpm Sinus rhythm with 1st degree AV block Left axis deviation  RBBB  CV: Stress Test 01/02/2018   The left ventricular ejection fraction is normal (55-65%).  Nuclear stress EF: 62%.  No T wave inversion was noted during stress.  There was no ST segment deviation noted during stress.  This is a low risk study.  The study is normal.   Normal perfusion. LVEF 62% with normal wall motion. This is a low risk study.  Echo 05/30/2016 Study Conclusions   - Left ventricle: The cavity size was normal. Posterior wall  thickness was increased in a pattern of mild LVH. Systolic  function was normal. The estimated ejection fraction was in the  range of 60% to 65%. Wall motion was  normal; there were no  regional wall motion abnormalities. Doppler parameters are  consistent with abnormal left ventricular relaxation (grade 1  diastolic dysfunction). Doppler parameters are consistent with  indeterminate ventricular filling pressure.  - Aortic valve: Transvalvular velocity was within the normal range.  There was no stenosis. There was no regurgitation.  - Mitral valve: Transvalvular velocity was within the normal range.  There was no evidence for stenosis. There was no regurgitation.  - Right ventricle: The cavity size was normal. Wall thickness was   normal. Systolic function was normal.  - Atrial septum: No defect or patent foramen ovale was identified.  - Tricuspid valve: There was no regurgitation. Past Medical History:  Diagnosis Date  . Adenocarcinoma of cecum ([place]) 06/21/2017  . Arthritis    "mid back; hands; knees" (02/24/2015)  . Basal cell carcinoma    left shoulder; mid chest; right eyelid (02/24/2015)  . CAD (coronary artery disease)    a. 08/2014 Cath/PCI: LM nl, LAD 30p, D1 95 (2.25x12 Resolute Integrity DES), LCX small, RI 100 (attempted PCI) - branches fill via L->L collats, RCA dominant, nl, RPDA/PLA nl, EF 60^. b. 02/24/2015 PCI CTO of Ramus DES x2.  . Chronic bronchitis (HCC)    hx  . Diverticulosis 05/2005  . Elevated lipase   . Fuchs' corneal dystrophy   . History of adenomatous polyp of colon 05/2005   8 mm adenoma  . History of blood transfusion    "related to some of my surgeries"  . Hyperlipidemia   . Hypertension   . Iron deficiency anemia due to chronic blood loss 06/21/2017  . Pneumonia   . Prostate cancer ([place]) 2011   S/P seed implant  . Sarcoidosis   . Thrombocytopenia ([place])    a. Noted on prior labs, unclear of what w/u done.  . Trifascicular block     Past Surgical History:  Procedure Laterality Date  . BASAL CELL CARCINOMA EXCISION Left    shoulder  . CARDIAC CATHETERIZATION N/A 02/24/2015   Procedure: Coronary/Bypass Graft CTO Intervention;  Surgeon: Jettie Booze, MD;  Location: [facility];  Service: Cardiovascular;  Laterality: N/A;  . CARDIAC CATHETERIZATION  02/24/2015   Procedure: Coronary/Graft Atherectomy;  Surgeon: Jettie Booze, MD;  Location: [facility];  Service: Cardiovascular;;  . CATARACT EXTRACTION W/ INTRAOCULAR LENS  IMPLANT, BILATERAL Bilateral ~ 2011-2012  . COLONOSCOPY W/ POLYPECTOMY  06/08/2005 and 10/18/2010   8 mm adenoma 2006, none 2012. Diverticulosis and internal hemorrhoids.  . CORNEAL TRANSPLANT Bilateral ~ 2011-2012   "@ same time as  cataract OR"  . CORONARY ANGIOPLASTY WITH STENT PLACEMENT  08/2014; 02/24/2015   "1 stent + 1 stent"  . CORONARY STENT INTERVENTION N/A 01/02/2019   Procedure: CORONARY STENT INTERVENTION;  Surgeon: Jettie Booze, MD;  Location: [facility];  Service: Cardiovascular;  Laterality: N/A;  . EYE SURGERY    . FINGER SURGERY Right 2014   "reattached middle finger"  . INSERTION PROSTATE RADIATION SEED  ~ 2011  . IR IMAGING GUIDED PORT INSERTION  03/04/2019  . IR RADIOLOGIST EVAL & MGMT  05/26/2020  . IR US GUIDE BX ASP/DRAIN  03/04/2019  . LAPAROSCOPIC CHOLECYSTECTOMY  2015  . LAPAROSCOPIC PARTIAL COLECTOMY N/A 07/26/2017   Procedure: LAPAROSCOPIC PARTIAL COLECTOMY, EXCISION SCROTAL CYST;  Surgeon: Greer Pickerel, MD;  [facility];  Service: General;  Laterality: N/A;  . LEFT HEART CATH AND CORONARY ANGIOGRAPHY N/A 01/02/2019   Procedure: LEFT HEART CATH AND CORONARY  ANGIOGRAPHY;  Surgeon: Jettie Booze, MD;  Location: [facility];  Service: Cardiovascular;  Laterality: N/A;  . LEFT HEART CATHETERIZATION WITH CORONARY ANGIOGRAM N/A 08/12/2014   Procedure: LEFT HEART CATHETERIZATION WITH CORONARY ANGIOGRAM;  Surgeon: Blane Ohara, MD;  Location: [facility] LAB;  Service: Cardiovascular;  Laterality: N/A;  . LYMPH NODE BIOPSY     mid chest  . MOHS SURGERY Right ~ 2011   "eyelid; for basal cell"  . THOROCOTOMY WITH LOBECTOMY Right ~ 1991   partial removal right lung  . TONSILLECTOMY  ~ 1950    MEDICATIONS: . amLODipine (NORVASC) 5 MG tablet  . CANNABIDIOL PO  . Cholecalciferol (VITAMIN D3) 2000 units capsule  . clopidogrel (PLAVIX) 75 MG tablet  . doxycycline (VIBRA-TABS) 100 MG tablet  . fluticasone (CUTIVATE) 0.05 % cream  . fluticasone furoate-vilanterol (BREO ELLIPTA) 200-25 MCG/INH AEPB  . furosemide (LASIX) 40 MG tablet  . gabapentin (NEURONTIN) 100 MG capsule  . metoprolol tartrate (LOPRESSOR) 25 MG tablet  . naproxen sodium (ALEVE) 220 MG tablet  .  nitroGLYCERIN (NITROSTAT) 0.4 MG SL tablet  . Polyethylene Glycol 3350 (MIRALAX PO)  . prochlorperazine (COMPAZINE) 10 MG tablet  . ranolazine (RANEXA) 500 MG 12 hr tablet  . rosuvastatin (CRESTOR) 20 MG tablet  . traMADol (ULTRAM) 50 MG tablet   No current facility-administered medications for this encounter.    Konrad Felix, PA-C [facility] [phone]

## 2020-06-26 ENCOUNTER — Other Ambulatory Visit (HOSPITAL_COMMUNITY)
Admission: RE | Admit: 2020-06-26 | Discharge: 2020-06-26 | Disposition: A | Discharge status: Home / Self Care | Payer: Medicare PPO | Source: Ambulatory Visit | Attending: Interventional Radiology | Admitting: Interventional Radiology

## 2020-06-26 DIAGNOSIS — Z01812 Encounter for preprocedural laboratory examination: Secondary | ICD-10-CM | POA: Insufficient documentation

## 2020-06-26 DIAGNOSIS — Z20822 Contact with and (suspected) exposure to covid-19: Secondary | ICD-10-CM | POA: Insufficient documentation

## 2020-06-26 LAB — SARS CORONAVIRUS 2 (TAT 6-24 HRS): SARS Coronavirus 2: NEGATIVE

## 2020-06-28 ENCOUNTER — Other Ambulatory Visit (HOSPITAL_COMMUNITY): Payer: Medicare PPO

## 2020-06-28 ENCOUNTER — Other Ambulatory Visit: Payer: Self-pay | Admitting: Radiology

## 2020-06-29 ENCOUNTER — Other Ambulatory Visit: Payer: Self-pay | Admitting: Student

## 2020-06-30 ENCOUNTER — Other Ambulatory Visit: Payer: Self-pay | Admitting: Interventional Cardiology

## 2020-06-30 ENCOUNTER — Ambulatory Visit (HOSPITAL_COMMUNITY)
Admission: RE | Admit: 2020-06-30 | Discharge: 2020-06-30 | Disposition: A | Discharge status: Home / Self Care | Payer: Medicare PPO | Source: Ambulatory Visit | Attending: Interventional Radiology | Admitting: Interventional Radiology

## 2020-06-30 ENCOUNTER — Other Ambulatory Visit: Payer: Self-pay

## 2020-06-30 ENCOUNTER — Encounter (HOSPITAL_COMMUNITY): Payer: Self-pay | Admitting: Interventional Radiology

## 2020-06-30 ENCOUNTER — Ambulatory Visit (HOSPITAL_COMMUNITY): Payer: Medicare PPO | Admitting: Physician Assistant

## 2020-06-30 ENCOUNTER — Ambulatory Visit (HOSPITAL_COMMUNITY): Payer: Medicare PPO | Admitting: Registered Nurse

## 2020-06-30 ENCOUNTER — Encounter (HOSPITAL_COMMUNITY): Payer: Self-pay

## 2020-06-30 ENCOUNTER — Ambulatory Visit (HOSPITAL_COMMUNITY)
Admission: RE | Admit: 2020-06-30 | Discharge: 2020-06-30 | Disposition: A | Discharge status: Home / Self Care | Payer: Medicare PPO | Attending: Interventional Radiology | Admitting: Interventional Radiology

## 2020-06-30 ENCOUNTER — Encounter (HOSPITAL_COMMUNITY)
Admission: RE | Disposition: A | Discharge status: Home / Self Care | Payer: Self-pay | Source: Home / Self Care | Attending: Interventional Radiology

## 2020-06-30 DIAGNOSIS — C787 Secondary malignant neoplasm of liver and intrahepatic bile duct: Secondary | ICD-10-CM | POA: Insufficient documentation

## 2020-06-30 DIAGNOSIS — I2510 Atherosclerotic heart disease of native coronary artery without angina pectoris: Secondary | ICD-10-CM | POA: Diagnosis not present

## 2020-06-30 DIAGNOSIS — R160 Hepatomegaly, not elsewhere classified: Secondary | ICD-10-CM | POA: Diagnosis not present

## 2020-06-30 DIAGNOSIS — I10 Essential (primary) hypertension: Secondary | ICD-10-CM | POA: Diagnosis not present

## 2020-06-30 DIAGNOSIS — Z7902 Long term (current) use of antithrombotics/antiplatelets: Secondary | ICD-10-CM | POA: Insufficient documentation

## 2020-06-30 DIAGNOSIS — Z79899 Other long term (current) drug therapy: Secondary | ICD-10-CM | POA: Diagnosis not present

## 2020-06-30 DIAGNOSIS — C189 Malignant neoplasm of colon, unspecified: Secondary | ICD-10-CM

## 2020-06-30 DIAGNOSIS — K769 Liver disease, unspecified: Secondary | ICD-10-CM | POA: Diagnosis not present

## 2020-06-30 DIAGNOSIS — D500 Iron deficiency anemia secondary to blood loss (chronic): Secondary | ICD-10-CM | POA: Diagnosis not present

## 2020-06-30 DIAGNOSIS — K7689 Other specified diseases of liver: Secondary | ICD-10-CM | POA: Diagnosis not present

## 2020-06-30 HISTORY — PX: RADIOLOGY WITH ANESTHESIA: SHX6223

## 2020-06-30 LAB — CBC
HCT: 39.5 % (ref 39.0–52.0)
Hemoglobin: 13 g/dL (ref 13.0–17.0)
MCH: 30.7 pg (ref 26.0–34.0)
MCHC: 32.9 g/dL (ref 30.0–36.0)
MCV: 93.2 fL (ref 80.0–100.0)
Platelets: 161 10*3/uL (ref 150–400)
RBC: 4.24 MIL/uL (ref 4.22–5.81)
RDW: 16.1 % — ABNORMAL HIGH (ref 11.5–15.5)
WBC: 6.6 10*3/uL (ref 4.0–10.5)
nRBC: 0 % (ref 0.0–0.2)

## 2020-06-30 LAB — PROTIME-INR
INR: 0.9 (ref 0.8–1.2)
Prothrombin Time: 12.1 seconds (ref 11.4–15.2)

## 2020-06-30 LAB — BASIC METABOLIC PANEL
Anion gap: 10 (ref 5–15)
BUN: 17 mg/dL (ref 8–23)
CO2: 25 mmol/L (ref 22–32)
Calcium: 9.3 mg/dL (ref 8.9–10.3)
Chloride: 104 mmol/L (ref 98–111)
Creatinine, Ser: 0.9 mg/dL (ref 0.61–1.24)
GFR, Estimated: >60 mL/min (ref >60)
Glucose, Bld: 114 mg/dL — ABNORMAL HIGH (ref 70–99)
Potassium: 4.2 mmol/L (ref 3.5–5.1)
Sodium: 139 mmol/L (ref 135–145)

## 2020-06-30 SURGERY — IR WITH ANESTHESIA
Anesthesia: General

## 2020-06-30 MED ORDER — CEFAZOLIN SODIUM-DEXTROSE 2-4 GM/100ML-% IV SOLN
2.0000 g | INTRAVENOUS | Status: AC
  Administered 2020-06-30: 2 g via INTRAVENOUS

## 2020-06-30 MED ORDER — ONDANSETRON HCL 4 MG/2ML IJ SOLN
INTRAMUSCULAR | Status: DC | PRN
  Administered 2020-06-30: 4 mg via INTRAVENOUS

## 2020-06-30 MED ORDER — EPHEDRINE SULFATE-NACL 50-0.9 MG/10ML-% IV SOSY
PREFILLED_SYRINGE | INTRAVENOUS | Status: DC | PRN
  Administered 2020-06-30: 10 mg via INTRAVENOUS

## 2020-06-30 MED ORDER — ORAL CARE MOUTH RINSE: 15.0000 mL | Freq: Once | OROMUCOSAL | Status: AC

## 2020-06-30 MED ORDER — PHENYLEPHRINE HCL-NACL 10-0.9 MG/250ML-% IV SOLN
INTRAVENOUS | Status: DC | PRN
  Administered 2020-06-30: 50 ug/min via INTRAVENOUS

## 2020-06-30 MED ORDER — PHENYLEPHRINE 40 MCG/ML (10ML) SYRINGE FOR IV PUSH (FOR BLOOD PRESSURE SUPPORT)
PREFILLED_SYRINGE | INTRAVENOUS | Status: DC | PRN
  Administered 2020-06-30: 120 ug via INTRAVENOUS
  Administered 2020-06-30 (×2): 80 ug via INTRAVENOUS
  Administered 2020-06-30: 120 ug via INTRAVENOUS

## 2020-06-30 MED ORDER — CHLORHEXIDINE GLUCONATE 0.12 % MT SOLN
15.0000 mL | Freq: Once | OROMUCOSAL | Status: AC
  Administered 2020-06-30: 15 mL via OROMUCOSAL
  Filled 2020-06-30: qty 15

## 2020-06-30 MED ORDER — DEXAMETHASONE SODIUM PHOSPHATE 10 MG/ML IJ SOLN
INTRAMUSCULAR | Status: DC | PRN
  Administered 2020-06-30: 10 mg via INTRAVENOUS

## 2020-06-30 MED ORDER — IOHEXOL 350 MG/ML SOLN
100.0000 mL | Freq: Once | INTRAVENOUS | Status: AC | PRN
  Administered 2020-06-30: 80 mL via INTRAVENOUS

## 2020-06-30 MED ORDER — FENTANYL CITRATE (PF) 250 MCG/5ML IJ SOLN
INTRAMUSCULAR | Status: DC | PRN
  Administered 2020-06-30: 100 ug via INTRAVENOUS

## 2020-06-30 MED ORDER — LIDOCAINE 2% (20 MG/ML) 5 ML SYRINGE
INTRAMUSCULAR | Status: DC | PRN
  Administered 2020-06-30: 100 mg via INTRAVENOUS

## 2020-06-30 MED ORDER — LACTATED RINGERS IV SOLN: INTRAVENOUS | Status: DC

## 2020-06-30 MED ORDER — SODIUM CHLORIDE 0.9 % IV SOLN: INTRAVENOUS | Status: DC

## 2020-06-30 MED ORDER — FENTANYL CITRATE (PF) 100 MCG/2ML IJ SOLN: 25.0000 ug | INTRAMUSCULAR | Status: DC | PRN

## 2020-06-30 MED ORDER — PROPOFOL 10 MG/ML IV BOLUS
INTRAVENOUS | Status: DC | PRN
  Administered 2020-06-30: 180 mg via INTRAVENOUS

## 2020-06-30 MED ORDER — ROCURONIUM BROMIDE 10 MG/ML (PF) SYRINGE
PREFILLED_SYRINGE | INTRAVENOUS | Status: DC | PRN
  Administered 2020-06-30: 60 mg via INTRAVENOUS

## 2020-06-30 MED ORDER — SUGAMMADEX SODIUM 200 MG/2ML IV SOLN
INTRAVENOUS | Status: DC | PRN
  Administered 2020-06-30: 400 mg via INTRAVENOUS

## 2020-06-30 MED ORDER — FENTANYL CITRATE (PF) 250 MCG/5ML IJ SOLN
INTRAMUSCULAR | Status: AC
  Filled 2020-06-30: qty 5

## 2020-06-30 MED ORDER — AMISULPRIDE (ANTIEMETIC) 5 MG/2ML IV SOLN: 10.0000 mg | Freq: Once | INTRAVENOUS | Status: DC | PRN

## 2020-06-30 MED ORDER — CEFAZOLIN SODIUM-DEXTROSE 2-4 GM/100ML-% IV SOLN
INTRAVENOUS | Status: AC
  Filled 2020-06-30: qty 100

## 2020-06-30 NOTE — Anesthesia Postprocedure Evaluation (Signed)
Anesthesia Post Note  Patient: Luis [name]  Procedure(s) Performed: IR WITH ANESTHESIA MICROWAVE ABLATION (N/A )     Patient location during evaluation: PACU Anesthesia Type: General Level of consciousness: awake and alert and oriented Pain management: pain level controlled Vital Signs Assessment: post-procedure vital signs reviewed and stable Respiratory status: spontaneous breathing, nonlabored ventilation and respiratory function stable Cardiovascular status: blood pressure returned to baseline Postop Assessment: no apparent nausea or vomiting Anesthetic complications: no   No complications documented.  Last Vitals:  Vitals:   06/30/20 1430 06/30/20 1445  BP: 132/61 136/69  Pulse: 61 63  Resp: 12 15  Temp:  36.4 C  SpO2: 99% 98%    Last Pain:  Vitals:   06/30/20 1445  TempSrc:   PainSc: 0-No pain                 Brennan Bailey

## 2020-06-30 NOTE — Anesthesia Procedure Notes (Signed)
Procedure Name: Intubation Date/Time: 06/30/2020 12:58 PM Performed by: Talbot Grumbling, CRNA Pre-anesthesia Checklist: Patient identified, Emergency Drugs available, Suction available and Patient being monitored Patient Re-evaluated:Patient Re-evaluated prior to induction Oxygen Delivery Method: Circle system utilized Preoxygenation: Pre-oxygenation with 100% oxygen Induction Type: IV induction Ventilation: Mask ventilation without difficulty Laryngoscope Size: Mac and 3 Grade View: Grade II Tube type: Oral Tube size: 8.0 mm Number of attempts: 1 Airway Equipment and Method: Stylet Placement Confirmation: ETT inserted through vocal cords under direct vision,  positive ETCO2 and breath sounds checked- equal and bilateral Secured at: 23 cm Tube secured with: Tape Dental Injury: Teeth and Oropharynx as per pre-operative assessment

## 2020-06-30 NOTE — Transfer of Care (Signed)
Immediate Anesthesia Transfer of Care Note  Patient: Luis [name]  Procedure(s) Performed: IR WITH ANESTHESIA MICROWAVE ABLATION (N/A )  Patient Location: PACU  Anesthesia Type:General  Level of Consciousness: awake, alert  and oriented  Airway & Oxygen Therapy: Patient Spontanous Breathing and Patient connected to face mask oxygen  Post-op Assessment: Report given to RN and Post -op Vital signs reviewed and stable  Post vital signs: Reviewed and stable  Last Vitals:  Vitals Value Taken Time  BP 119/64 06/30/20 1406  Temp    Pulse 66 06/30/20 1408  Resp 17 06/30/20 1408  SpO2 100 % 06/30/20 1408  Vitals shown include unvalidated device data.  Last Pain:  Vitals:   06/30/20 1016  TempSrc: Oral         Complications: No complications documented.

## 2020-06-30 NOTE — H&P (Signed)
Referring Physician(s): Sherrill,B  Supervising Physician: Corrie Mckusick  Patient Status:  [facility] OP TBA  Chief Complaint:  Metastatic colon cancer to liver  Subjective: Patient familiar to IR service from Port-A-Cath placement in 2020 and tele consultation with Dr. Earleen Newport on 05/26/2020 to discuss treatment options for metastatic colon cancer to the liver.  He was deemed an appropriate candidate for CT-guided microwave ablation of 2 right hepatic lesions and presents today for the procedure.  Additional left hepatic lesion will be observed for now.  Patient denies fever, headache, chest pain, dyspnea, abdominal pain, nausea, vomiting or bleeding.  He does have  chronic back pain and occasional cough.  Additional medical history as listed below.  Past Medical History:  Diagnosis Date  . Adenocarcinoma of cecum ([place]) 06/21/2017  . Arthritis    "mid back; hands; knees" (02/24/2015)  . Basal cell carcinoma    left shoulder; mid chest; right eyelid (02/24/2015)  . CAD (coronary artery disease)    a. 08/2014 Cath/PCI: LM nl, LAD 30p, D1 95 (2.25x12 Resolute Integrity DES), LCX small, RI 100 (attempted PCI) - branches fill via L->L collats, RCA dominant, nl, RPDA/PLA nl, EF 60^. b. 02/24/2015 PCI CTO of Ramus DES x2.  . Chronic bronchitis (HCC)    hx  . Diverticulosis 05/2005  . Elevated lipase   . Fuchs' corneal dystrophy   . History of adenomatous polyp of colon 05/2005   8 mm adenoma  . History of blood transfusion    "related to some of my surgeries"  . Hyperlipidemia   . Hypertension   . Iron deficiency anemia due to chronic blood loss 06/21/2017  . Pneumonia   . Prostate cancer ([place]) 2011   S/P seed implant  . Sarcoidosis   . Thrombocytopenia ([place])    a. Noted on prior labs, unclear of what w/u done.  . Trifascicular block    Past Surgical History:  Procedure Laterality Date  . BASAL CELL CARCINOMA EXCISION Left    shoulder  . CARDIAC CATHETERIZATION N/A 02/24/2015    Procedure: Coronary/Bypass Graft CTO Intervention;  Surgeon: Jettie Booze, MD;  Location: [facility];  Service: Cardiovascular;  Laterality: N/A;  . CARDIAC CATHETERIZATION  02/24/2015   Procedure: Coronary/Graft Atherectomy;  Surgeon: Jettie Booze, MD;  Location: [facility];  Service: Cardiovascular;;  . CATARACT EXTRACTION W/ INTRAOCULAR LENS  IMPLANT, BILATERAL Bilateral ~ 2011-2012  . COLONOSCOPY W/ POLYPECTOMY  06/08/2005 and 10/18/2010   8 mm adenoma 2006, none 2012. Diverticulosis and internal hemorrhoids.  . CORNEAL TRANSPLANT Bilateral ~ 2011-2012   "@ same time as cataract OR"  . CORONARY ANGIOPLASTY WITH STENT PLACEMENT  08/2014; 02/24/2015   "1 stent + 1 stent"  . CORONARY STENT INTERVENTION N/A 01/02/2019   Procedure: CORONARY STENT INTERVENTION;  Surgeon: Jettie Booze, MD;  Location: [facility];  Service: Cardiovascular;  Laterality: N/A;  . EYE SURGERY    . FINGER SURGERY Right 2014   "reattached middle finger"  . INSERTION PROSTATE RADIATION SEED  ~ 2011  . IR IMAGING GUIDED PORT INSERTION  03/04/2019  . IR RADIOLOGIST EVAL & MGMT  05/26/2020  . IR US GUIDE BX ASP/DRAIN  03/04/2019  . LAPAROSCOPIC CHOLECYSTECTOMY  2015  . LAPAROSCOPIC PARTIAL COLECTOMY N/A 07/26/2017   Procedure: LAPAROSCOPIC PARTIAL COLECTOMY, EXCISION SCROTAL CYST;  Surgeon: Greer Pickerel, MD;  Location: [facility];  Service: General;  Laterality: N/A;  . LEFT HEART CATH AND CORONARY ANGIOGRAPHY N/A 01/02/2019  Procedure: LEFT HEART CATH AND CORONARY ANGIOGRAPHY;  Surgeon: Jettie Booze, MD;  Location: [place] [facility];  Service: Cardiovascular;  Laterality: N/A;  . LEFT HEART CATHETERIZATION WITH CORONARY ANGIOGRAM N/A 08/12/2014   Procedure: LEFT HEART CATHETERIZATION WITH CORONARY ANGIOGRAM;  Surgeon: Blane Ohara, MD;  Location: [facility] CATH LAB;  Service: Cardiovascular;  Laterality: N/A;  . LYMPH NODE BIOPSY     mid chest  . MOHS SURGERY Right ~ 2011    "eyelid; for basal cell"  . THOROCOTOMY WITH LOBECTOMY Right ~ 1991   partial removal right lung  . TONSILLECTOMY  ~ 1950        Allergies: Patient has no known allergies.  Medications: Prior to Admission medications   Medication Sig Start Date End Date Taking? Authorizing Provider  amLODipine (NORVASC) 5 MG tablet TAKE 1 TABLET BY MOUTH TWICE A DAY Patient taking differently: Take 5 mg by mouth 2 (two) times daily. 12/30/19  Yes Jettie Booze, MD  CANNABIDIOL PO Take 12 drops by mouth 2 (two) times a day. CBD OIL   Yes [provider]  Cholecalciferol (VITAMIN D3) 2000 units capsule Take 2,000 Units by mouth every evening.    Yes [provider]  doxycycline (VIBRA-TABS) 100 MG tablet TAKE 1 TABLET BY MOUTH TWICE A DAY Patient taking differently: Take 100 mg by mouth 2 (two) times daily. 04/28/20  Yes Ladell Pier, MD  fluticasone (CUTIVATE) 0.05 % cream APPLY TO AFFECTED AREAS ON FACE 2 TIMES DAILY Patient taking differently: Apply 1 application topically 2 (two) times daily. 06/15/20  Yes Ladell Pier, MD  fluticasone furoate-vilanterol (BREO ELLIPTA) 200-25 MCG/INH AEPB Inhale 1 puff into the lungs daily. 04/28/20  Yes Hunsucker, Bonna Gains, MD  furosemide (LASIX) 40 MG tablet Take 1 tablet (40 mg total) by mouth daily. 06/16/20  Yes Jettie Booze, MD  naproxen sodium (ALEVE) 220 MG tablet Take 440 mg by mouth daily.   Yes [provider]  nitroGLYCERIN (NITROSTAT) 0.4 MG SL tablet PLACE 1 TABLET UNDER THE TONGUE EVERY 5 MINUTES AS NEEDED FOR CHEST PAIN FOR 3 DOSES Patient taking differently: Place 0.4 mg under the tongue every 5 (five) minutes as needed for chest pain. 01/16/20  Yes Jettie Booze, MD  Polyethylene Glycol 3350 (MIRALAX PO) Take 17 g by mouth daily as needed (constipation).   Yes [provider]  prochlorperazine (COMPAZINE) 10 MG tablet Take 1 tablet (10 mg total) by mouth every 6 (six) hours as  needed. Patient taking differently: Take 10 mg by mouth every 6 (six) hours as needed for vomiting or nausea. 02/26/19  Yes Ladell Pier, MD  ranolazine (RANEXA) 500 MG 12 hr tablet Take 1 tablet (500 mg total) by mouth 2 (two) times daily. Please keep upcoming appointment in Jan 2022 for future refills. Thank you 05/28/20  Yes Jettie Booze, MD  rosuvastatin (CRESTOR) 20 MG tablet TAKE 1 TABLET BY MOUTH EVERY DAY Patient taking differently: Take 20 mg by mouth daily. 09/29/19  Yes Jettie Booze, MD  traMADol (ULTRAM) 50 MG tablet Take 50-100 mg by mouth every 6 (six) hours as needed for moderate pain or severe pain.   Yes [provider]  clopidogrel (PLAVIX) 75 MG tablet Take 1 tablet (75 mg total) by mouth daily. 05/28/20   Jettie Booze, MD  gabapentin (NEURONTIN) 100 MG capsule Take 2 capsules (200 mg total) by mouth at bedtime. Patient not taking: No sig reported 03/26/20  Ladell Pier, MD  metoprolol tartrate (LOPRESSOR) 25 MG tablet TAKE 1/2 TABLET BY MOUTH TWICE A DAY 06/30/20   Jettie Booze, MD     Vital Signs: BP (!) 141/70   Pulse 67   Temp 97.9 F (36.6 C) (Oral)   Resp 12   SpO2 98%   Physical Exam awake, alert.  Chest clear to auscultation bilaterally.  Heart with regular rate and rhythm.  Abdomen soft, positive bowel sounds, nontender.  Pretibial edema noted bilaterally.  Imaging: No results found.  Labs:  CBC: Recent Labs    05/26/20 1018 06/07/20 1119 06/24/20 0844 06/30/20 1003  WBC 6.1 6.1 6.0 6.6  HGB 12.0* 12.1* 13.1 13.0  HCT 38.5* 37.6* 38.6* 39.5  PLT 175 138* 159 161    COAGS: No results for input(s): INR, APTT in the last 8760 hours.  BMP: Recent Labs    01/28/20 0844 02/11/20 1027 02/25/20 0920 03/10/20 0855 03/25/20 0739 05/20/20 1028 05/26/20 1018 06/07/20 1119 06/24/20 0844  NA 137 137 136 140   < > 138 137 139 140  K 4.1 4.4 4.2 4.2   < > 4.5 4.5 4.2 4.2  CL 106 104 106 106   < > 106  105 106 100  CO2 26 28 26 27    < > 25 23 27 28   GLUCOSE 105* 103* 103* 91   < > 109* 116* 105* 130*  BUN 14 12 17 13    < > 17 19 16 18   CALCIUM 9.2 9.6 8.6* 8.9   < > 9.1 9.0 8.9 9.2  CREATININE 0.80 0.79 0.78 0.84   < > 0.85 0.86 0.83 0.96  GFRNONAA >60 >60 >60 >60   < > >60 >60 >60 >60  GFRAA >60 >60 >60 >60  --   --   --   --   --    < > = values in this interval not displayed.    LIVER FUNCTION TESTS: Recent Labs    04/14/20 0856 04/29/20 1026 05/20/20 1028 06/07/20 1119  BILITOT 0.5 0.5 0.4 0.5  AST 35 45* 45* 43*  ALT 36 38 38 38  ALKPHOS 68 68 67 68  PROT 5.8* 6.0* 6.3* 6.3*  ALBUMIN 3.4* 3.5 3.6 3.7    Assessment and Plan: Patient with past medical history significant for sarcoidosis, prostate cancer 2011, hypertension, hyperlipidemia, Fuchs dystrophy, diverticulosis, coronary artery disease with prior stenting and metastatic colon cancer to liver.He underwent tele consultation with Dr. Earleen Newport on 05/26/2020 to discuss treatment options for metastatic colon cancer to the liver.  He was deemed an appropriate candidate for CT-guided microwave ablation of 2 right hepatic lesions and presents today for the procedure.  Additional left hepatic lesion will be observed for now.  Details/risks of procedure, including but not limited to, internal bleeding, infection, injury to adjacent structures, anesthesia related complications discussed with patient with his understanding and consent.  Post procedure he will be observed in the hospital overnight.   Electronically Signed: D. Rowe Robert, PA-C 06/30/2020, 10:43 AM   I spent a total of 30 minutes at the the patient's bedside AND on the patient's hospital floor or unit, greater than 50% of which was counseling/coordinating care for CT-guided microwave ablation of right hepatic lobe lesions

## 2020-06-30 NOTE — Procedures (Signed)
Interventional Radiology Procedure Note  Procedure:   Patient presents for Image guided ablation of 2 separate right liver metastatic lesions, planned microwave ablation.  No ablation performed based on findings.  GETA   Findings: CT shows significant enlargement of the 2 right sided liver lesions, as well as re-appearance of the left liver lesion.  All are ~5cm diameter at this point.   We deferred treatment today, and will discuss with Oncology a new plan, potentially y90 therapy.   Complications: None  Recommendations:  - DC home when goals met - Plan for another discussion about options, in particular y90.     Signed,  Dulcy Fanny. Earleen Newport, DO

## 2020-06-30 NOTE — Sedation Documentation (Signed)
Anesthesia in to sedate and monitor. 

## 2020-07-01 ENCOUNTER — Encounter: Payer: Self-pay | Admitting: Oncology

## 2020-07-01 ENCOUNTER — Encounter (HOSPITAL_COMMUNITY): Payer: Self-pay | Admitting: Interventional Radiology

## 2020-07-02 ENCOUNTER — Other Ambulatory Visit: Payer: Self-pay

## 2020-07-02 ENCOUNTER — Inpatient Hospital Stay: Payer: Medicare PPO | Attending: Nurse Practitioner | Admitting: Oncology

## 2020-07-02 VITALS — BP 137/64 | HR 64 | Temp 97.1°F | Resp 17 | Ht 71.0 in | Wt 243.7 lb

## 2020-07-02 DIAGNOSIS — I2510 Atherosclerotic heart disease of native coronary artery without angina pectoris: Secondary | ICD-10-CM | POA: Diagnosis not present

## 2020-07-02 DIAGNOSIS — D869 Sarcoidosis, unspecified: Secondary | ICD-10-CM | POA: Diagnosis not present

## 2020-07-02 DIAGNOSIS — C180 Malignant neoplasm of cecum: Secondary | ICD-10-CM | POA: Diagnosis not present

## 2020-07-02 DIAGNOSIS — Z95828 Presence of other vascular implants and grafts: Secondary | ICD-10-CM | POA: Diagnosis not present

## 2020-07-02 DIAGNOSIS — C787 Secondary malignant neoplasm of liver and intrahepatic bile duct: Secondary | ICD-10-CM | POA: Diagnosis not present

## 2020-07-02 DIAGNOSIS — D696 Thrombocytopenia, unspecified: Secondary | ICD-10-CM | POA: Diagnosis not present

## 2020-07-02 DIAGNOSIS — G620 Drug-induced polyneuropathy: Secondary | ICD-10-CM | POA: Diagnosis not present

## 2020-07-02 MED ORDER — HEPARIN SOD (PORK) LOCK FLUSH 100 UNIT/ML IV SOLN
500.0000 [IU] | Freq: Once | INTRAVENOUS | Status: AC | PRN
  Administered 2020-07-02: 500 [IU]
  Filled 2020-07-02: qty 5

## 2020-07-02 MED ORDER — SODIUM CHLORIDE 0.9% FLUSH
10.0000 mL | INTRAVENOUS | Status: DC | PRN
  Administered 2020-07-02 (×2): 10 mL
  Filled 2020-07-02: qty 10

## 2020-07-02 NOTE — Progress Notes (Signed)
[facility] OFFICE PROGRESS NOTE   Diagnosis: Colon cancer  INTERVAL HISTORY:   Luis [name] was scheduled for ablation of 2 liver lesions on 06/30/2020.  On CT 06/30/2020 the targeted lesions had enlarged with the segment 7 lesion now measuring 4.3 x 3.8 cm and the segment 6 lesion measuring 5.8 cm.  A third lesion is now present in segment 2 measuring 4.5 cm.  No other lesions.  The procedure was aborted.  He discussed Y 90 treatment with Dr. Earleen Newport.  Luis [name] reports improvement in exertional dyspnea with an inhaler.  The skin rash has improved.  Objective:  Vital signs in last 24 hours:  Blood pressure 137/64, pulse 64, temperature (!) 97.1 F (36.2 C), temperature source Tympanic, resp. rate 17, height $RemoveBe'5\' 11"'BZekmxPtE$  (1.803 m), weight 110.5 kg, SpO2 100 %.   Resp: Lungs clear bilaterally Cardio: Regular rate and rhythm GI: No hepatomegaly Vascular: Trace lower leg edema bilaterally  Skin: Resolving acneform rash over the chest and back  Portacath/PICC-without erythema  Lab Results:  Lab Results  Component Value Date   WBC 6.6 06/30/2020   HGB 13.0 06/30/2020   HCT 39.5 06/30/2020   MCV 93.2 06/30/2020   PLT 161 06/30/2020   NEUTROABS 4.0 06/07/2020    CMP  Lab Results  Component Value Date   NA 139 06/30/2020   K 4.2 06/30/2020   CL 104 06/30/2020   CO2 25 06/30/2020   GLUCOSE 114 (H) 06/30/2020   BUN 17 06/30/2020   CREATININE 0.90 06/30/2020   CALCIUM 9.3 06/30/2020   PROT 6.3 (L) 06/07/2020   ALBUMIN 3.7 06/07/2020   AST 43 (H) 06/07/2020   ALT 38 06/07/2020   ALKPHOS 68 06/07/2020   BILITOT 0.5 06/07/2020   GFRNONAA >60 06/30/2020   GFRAA >60 03/10/2020    Lab Results  Component Value Date   CEA1 2.10 04/14/2020    Lab Results  Component Value Date   INR 0.9 06/30/2020    Imaging:  CT ABDOMEN W WO CONTRAST  Result Date: 06/30/2020 CLINICAL DATA:  80 year old male with a history of metastatic colorectal carcinoma to the liver,  previously treated with chemotherapy, presents for image guided ablation of 2 right-sided liver lesions. Current CT is performed with general anesthesia preparing for CT-guided ablation. EXAM: CT ABDOMEN WITHOUT AND WITH CONTRAST TECHNIQUE: Multidetector CT imaging of the abdomen was performed following the standard protocol before and following the bolus administration of intravenous contrast. CONTRAST:  6mL OMNIPAQUE IOHEXOL 350 MG/ML SOLN COMPARISON:  05/26/2020, MR 05/19/2020, 04/26/2020, 02/07/2019 FINDINGS: Lower chest: Atelectatic changes at the lung bases. Hepatobiliary: There are 3 lesions now within the liver. The targeted right-sided liver lesions are larger than on comparison CT and MRI. The more cranial segment 7 lesion measures approximately 4.3 cm x 3.8 cm, larger than the prior CT of 3 cm. The more caudal lesion in the segment 6, extending to the margin of the liver is estimated greatest diameter 5.8 cm within low segment 6. Additional third lesion which was previously not visualized within segment 2 in the left liver was present on the baseline CT of August 2020, and has recurred/regrown. Greatest extubated diameter 4.5 cm. These 3 irregularly hypoenhancing lesions are the only 3 lesions identified. There is a cystic lesion within the more midline segment 2 which has been present previously and unchanged. Pancreas: Unremarkable Spleen: Unremarkable Adrenals/Urinary Tract: - Right adrenal gland:  Unremarkable - Left adrenal gland: Unremarkable. - Right kidney: Visualized right kidney with no  hydronephrosis or nephrolithiasis. Small right-sided fat containing lesion, again most likely angiomyolipoma. - Left Kidney: Visualized left kidney with no hydronephrosis or nephrolithiasis. Symmetric perfusion to the right. - Urinary Bladder: Not visualized Stomach/Bowel: Visualized stomach, small bowel, colon unremarkable. Vascular/Lymphatic: Atherosclerotic changes again noted. No lymphadenopathy. Other: None  Musculoskeletal: No acute displaced fracture. IMPRESSION: Initial CT for the planned image guided ablation of right-sided liver lesions demonstrates significant interval enlargement of the 2 right-sided liver lesion targets, now estimated maximum diameter 3.8 cm (segment 7), and 5.0 cm (segment 6), as well as interval recurrence of the left sided segment 2 lesion, estimated 4.5 cm. Ablation was deferred given the findings. Signed, Dulcy Fanny. Dellia Nims, RPVI [facility] Radiology Electronically Signed   By: Corrie Mckusick D.O.   On: 06/30/2020 15:50    Medications: I have reviewed the patient's current medications.   Assessment/Plan: 1. Adenocarcinoma of the cecum, stage IIA(T3N0), status post a right colectomy 07/26/2017 ? 0/16 lymph nodes positive, no lymphovascular invasion, perineural invasion present ? MSI-stable, no loss of mismatch repair protein expression ? Surveillance colonoscopy 07/26/2018-no polyps found ? Elevated CEA June 2020, persistently elevated August 2020 ? CTs 02/07/2019-bilateral liver metastases, no extrahepatic metastatic disease, changes of sarcoidosis in the chest ? Biopsy liver lesion 03/04/2019-metastatic adenocarcinoma, with morphology consistent with metastatic colonic adenocarcinoma, RAS WT ? Cycle 1 FOLFOX 03/12/2019 ? Cycle 2 FOLFOX 03/26/2019, oxaliplatin dose reduced secondary to thrombocytopenia ? Cycle 3 FOLFOX 04/09/2019, oxaliplatin further dose reduced and aspirin held secondary to thrombocytopenia ? Cycle 4 FOLFOX 04/23/2019 ? Cycle 5 FOLFOX 05/07/2019 ? CTs 05/23/2019-decrease in liver lesions. No new or progressive findings. ? Cycle 6 FOLFOX 05/27/2019 ? Cycle 7 FOLFOX 06/14/2019 ? Cycle 8 FOLFOX 07/02/2019-Udenyca held ? Cycle 9 FOLFOX 07/16/2019-Udenyca held ? Cycle 10 FOLFOX 07/30/2019-Udenyca held ? CT abdomen/pelvis 08/13/2019-mild improvement in previously noted liver lesions ? MRI abdomen 08/13/2019-3 liver  lesions ? Cycle 1 FOLFIRI/Avastin 09/17/2019 ? Cycle 2 FOLFIRI/Avastin 10/01/2019 ? Cycle 3 FOLFIRI/Avastin 10/15/2019 ? Cycle 4 FOLFIRI/Avastin 11/05/2019 ? Cycle 5 FOLFIRI/Avastin 11/19/2019 ? CTs 11/27/2019-stable liver lesions; no new liver lesions. No other evidence of metastatic disease in the abdomen or pelvis. ? Cycle 1 FOLFIRI/Panitumumab 12/03/2019 ? Cycle 2 FOLFIRI/Panitumumab 12/17/2019 (Panitumumab dose reduced due to rash) ? Cycle 3 FOLFIRI/Panitumumab 01/01/2020 ? Cycle 4 FOLFIRI/Panitumumab 01/14/2020 ? Cycle 5 FOLFIRI/Panitumumab 01/28/2020 ? CT abdomen/pelvis 02/06/2020-decreased size of right hepatic lesions, subtle lesion in the lateral left hepatic lobe-not clearly identified, noevidence of disease progression ? Cycle 6 FOLFIRI/Panitumumab 02/11/2020 ? Cycle 7 FOLFIRI/Panitumumab 03/10/2020 ? Cycle 8 FOLFIRI/Panitumumab 03/26/2020 ? Cycle 9 FOLFIRI/Panitumumab 04/14/2020 ? CT abdomen/pelvis 04/26/2020-mild enlargement of 2 liver lesions on initial report, repeat review with radiology consistent with stable disease ? Cycle 10 FOLFIRI/Panitumumab 04/29/2020 ? MRI liver 05/19/2020-right hepatic lobe lesions diminished in size when compared to the study of March 2021.  Lesion in hepatic subsegment VI with very limited assessment as well due to adjacent bowel. ? 05/20/2020 referred to interventional radiology to consider ablation of liver lesions ? CT abdomen 06/30/2020 prior to planned ablation procedure, enlargement of segment 7 liver lesion and segment 6 liver lesion.  Interval recurrence of segment 2 lesion measuring 4.5 cm, ablation aborted 2. History of colon polyps 3. Microcytic anemia January 2019 4. Sarcoidosis 5. Coronary artery disease 6. Port-A-Cath placement, interventional radiology, 03/04/2019 7. Thrombocytopenia, mild thrombocytopenia predating chemotherapy 8. Oxaliplatin neuropathy 9. Dyspnea on exertion-CT chest 02/25/2020 negative for PE, infection, pneumonitis; scheduled for  PFTs 03/10/2020, followed by  pulmonary     Disposition: Luis [name] has metastatic colon cancer.  Disease is limited to the liver.  He was scheduled to undergo ablation of 2 measurable right-sided liver lesions earlier this week, but a CT confirmed enlargement of these lesions rendering them no longer amenable to ablation, and a previously noted left-sided liver lesion has progressed.  The liver ablation was aborted.  I discussed treatment options with Luis [name].  He has been treated with multiple systemic therapy regimens, most recently FOLFIRI/panitumumab.  I think it is unlikely repeat treatment with FOLFIRI/panitumumab will render the hepatic lesions amenable to ablation.  We discussed treatment options including maintenance therapy with Xeloda +/- Avastin, concurrent chemotherapy and Y 90, and referral to [facility] to consider other hepatic directed therapy.  He agrees to referral to Dr. Fayrene Helper and Dr. Reynaldo Minium.  I will initiate these referrals today.  Luis [name] will return for an office visit and further discussion on 07/23/2020.  Betsy Coder, MD  07/02/2020  2:24 PM

## 2020-07-05 ENCOUNTER — Telehealth: Payer: Self-pay | Admitting: Oncology

## 2020-07-05 NOTE — Telephone Encounter (Signed)
Scheduled appointment per 1/21 los. Called patient, no answer. Left message with appointment date and time.

## 2020-07-09 ENCOUNTER — Other Ambulatory Visit: Payer: Medicare PPO

## 2020-07-09 ENCOUNTER — Ambulatory Visit: Payer: Medicare PPO | Admitting: Oncology

## 2020-07-09 DIAGNOSIS — C787 Secondary malignant neoplasm of liver and intrahepatic bile duct: Secondary | ICD-10-CM | POA: Diagnosis not present

## 2020-07-09 DIAGNOSIS — C189 Malignant neoplasm of colon, unspecified: Secondary | ICD-10-CM | POA: Diagnosis not present

## 2020-07-09 NOTE — Progress Notes (Signed)
Per request [facility] report faxed to [facility]. (attn: Franchot Gallo)

## 2020-07-12 DIAGNOSIS — C180 Malignant neoplasm of cecum: Secondary | ICD-10-CM | POA: Diagnosis not present

## 2020-07-12 DIAGNOSIS — J64 Unspecified pneumoconiosis: Secondary | ICD-10-CM | POA: Diagnosis not present

## 2020-07-12 DIAGNOSIS — R590 Localized enlarged lymph nodes: Secondary | ICD-10-CM | POA: Diagnosis not present

## 2020-07-12 DIAGNOSIS — C189 Malignant neoplasm of colon, unspecified: Secondary | ICD-10-CM | POA: Diagnosis not present

## 2020-07-12 DIAGNOSIS — K5900 Constipation, unspecified: Secondary | ICD-10-CM | POA: Diagnosis not present

## 2020-07-12 DIAGNOSIS — R600 Localized edema: Secondary | ICD-10-CM | POA: Diagnosis not present

## 2020-07-12 DIAGNOSIS — C787 Secondary malignant neoplasm of liver and intrahepatic bile duct: Secondary | ICD-10-CM | POA: Diagnosis not present

## 2020-07-12 DIAGNOSIS — Z01818 Encounter for other preprocedural examination: Secondary | ICD-10-CM | POA: Diagnosis not present

## 2020-07-13 ENCOUNTER — Other Ambulatory Visit: Payer: Medicare PPO

## 2020-07-13 ENCOUNTER — Other Ambulatory Visit: Payer: Self-pay | Admitting: Interventional Cardiology

## 2020-07-13 ENCOUNTER — Telehealth: Payer: Self-pay

## 2020-07-13 ENCOUNTER — Ambulatory Visit: Payer: Medicare PPO | Admitting: Oncology

## 2020-07-13 NOTE — Telephone Encounter (Signed)
Ok to stop metoprolol

## 2020-07-13 NOTE — Telephone Encounter (Signed)
Spoke with patient regarding upcoming procedure, wanted to discuss his HTN medication. Pt would like to reduce his medications; states B/P 125/45-50; advised would send message to Dr. Irish Lack to see if it would be ok to decrease amlodipine to once daily, or discontinue metoprolol.

## 2020-07-13 NOTE — Telephone Encounter (Signed)
   [facility] Pre-operative Risk Assessment     Request for surgical clearance:  1. What type of surgery is being performed? IntraArterial Hepatic pump placement  2. When is this surgery scheduled? 07/23/20  3. What type of clearance is required (medical clearance vs. Pharmacy clearance to hold med vs. Both)? both  4. Are there any medications that need to be held prior to surgery and how long?not noted  5. Practice name and name of physician performing surgery?[facility]/Dr. Binnie Rail   6. What is the office phone number? Call Santiago Glad RN for questions: cell: [phone]   7.   What is the office fax number? [phone]  8.   Anesthesia type (None, local, MAC, general) ? General   Luis [name] 07/13/2020, 5:35 PM  _________________________________________________________________   (provider comments below)

## 2020-07-14 NOTE — Telephone Encounter (Signed)
   Primary Cardiologist: Larae Grooms, MD  Chart reviewed and patient contacted today by phone as part of pre-operative protocol coverage. He has been chest pain free. Given past medical history and time since last visit, based on ACC/AHA guidelines, Luis [name] would be at acceptable risk for the planned procedure without further cardiovascular testing.   Ok to hold Plavix 5-7 days pre op- the patient is aware of these instructions.  The patient was advised that if he develops new symptoms prior to surgery to contact our office to arrange for a follow-up visit, and he verbalized understanding.  I will route this recommendation to the requesting party via Epic fax function and remove from pre-op pool.  Please call with questions.  Kerin Ransom, PA-C 07/14/2020, 8:55 AM

## 2020-07-14 NOTE — Telephone Encounter (Signed)
Rn called patient and stated that Dr. Irish Lack stated it was ok to stop taking his metoprolol, pt stated he would take his b/p in the mornings, and if they were low or he had issues he would call us. Patient denied any questions or concerns.

## 2020-07-15 ENCOUNTER — Encounter: Payer: Self-pay | Admitting: Nurse Practitioner

## 2020-07-16 ENCOUNTER — Ambulatory Visit: Payer: Medicare PPO | Admitting: Oncology

## 2020-07-16 ENCOUNTER — Telehealth: Payer: Self-pay | Admitting: Oncology

## 2020-07-16 ENCOUNTER — Other Ambulatory Visit: Payer: Medicare PPO

## 2020-07-16 ENCOUNTER — Encounter: Payer: Self-pay | Admitting: *Deleted

## 2020-07-16 NOTE — Telephone Encounter (Signed)
Scheduled appt per 2/4 sch msg - pt is aware of appt date and time

## 2020-07-16 NOTE — Progress Notes (Signed)
Patient sent MyChart message that he is having surgery on 07/23/20 per Dr. Fayrene Helper and needs to reschedule. Per Dr. Benay Spice, will see him 1 week after surgery. Scheduling message sent.

## 2020-07-18 DIAGNOSIS — C20 Malignant neoplasm of rectum: Secondary | ICD-10-CM | POA: Diagnosis not present

## 2020-07-18 DIAGNOSIS — C189 Malignant neoplasm of colon, unspecified: Secondary | ICD-10-CM | POA: Diagnosis not present

## 2020-07-18 DIAGNOSIS — C787 Secondary malignant neoplasm of liver and intrahepatic bile duct: Secondary | ICD-10-CM | POA: Diagnosis not present

## 2020-07-19 DIAGNOSIS — I6529 Occlusion and stenosis of unspecified carotid artery: Secondary | ICD-10-CM | POA: Diagnosis not present

## 2020-07-19 DIAGNOSIS — I2510 Atherosclerotic heart disease of native coronary artery without angina pectoris: Secondary | ICD-10-CM | POA: Diagnosis not present

## 2020-07-19 DIAGNOSIS — C182 Malignant neoplasm of ascending colon: Secondary | ICD-10-CM | POA: Diagnosis not present

## 2020-07-19 DIAGNOSIS — I4510 Unspecified right bundle-branch block: Secondary | ICD-10-CM | POA: Diagnosis not present

## 2020-07-19 DIAGNOSIS — C189 Malignant neoplasm of colon, unspecified: Secondary | ICD-10-CM | POA: Diagnosis not present

## 2020-07-19 DIAGNOSIS — Z01818 Encounter for other preprocedural examination: Secondary | ICD-10-CM | POA: Diagnosis not present

## 2020-07-19 DIAGNOSIS — C180 Malignant neoplasm of cecum: Secondary | ICD-10-CM | POA: Diagnosis not present

## 2020-07-19 DIAGNOSIS — I440 Atrioventricular block, first degree: Secondary | ICD-10-CM | POA: Diagnosis not present

## 2020-07-19 DIAGNOSIS — C19 Malignant neoplasm of rectosigmoid junction: Secondary | ICD-10-CM | POA: Diagnosis not present

## 2020-07-19 DIAGNOSIS — C787 Secondary malignant neoplasm of liver and intrahepatic bile duct: Secondary | ICD-10-CM | POA: Diagnosis not present

## 2020-07-19 DIAGNOSIS — R9431 Abnormal electrocardiogram [ECG] [EKG]: Secondary | ICD-10-CM | POA: Diagnosis not present

## 2020-07-19 DIAGNOSIS — I10 Essential (primary) hypertension: Secondary | ICD-10-CM | POA: Diagnosis not present

## 2020-07-19 DIAGNOSIS — D869 Sarcoidosis, unspecified: Secondary | ICD-10-CM | POA: Diagnosis not present

## 2020-07-19 DIAGNOSIS — I444 Left anterior fascicular block: Secondary | ICD-10-CM | POA: Diagnosis not present

## 2020-07-19 DIAGNOSIS — Z8546 Personal history of malignant neoplasm of prostate: Secondary | ICD-10-CM | POA: Diagnosis not present

## 2020-07-19 DIAGNOSIS — Z7982 Long term (current) use of aspirin: Secondary | ICD-10-CM | POA: Diagnosis not present

## 2020-07-20 DIAGNOSIS — C787 Secondary malignant neoplasm of liver and intrahepatic bile duct: Secondary | ICD-10-CM | POA: Diagnosis not present

## 2020-07-20 DIAGNOSIS — N401 Enlarged prostate with lower urinary tract symptoms: Secondary | ICD-10-CM | POA: Diagnosis not present

## 2020-07-20 DIAGNOSIS — C182 Malignant neoplasm of ascending colon: Secondary | ICD-10-CM | POA: Diagnosis not present

## 2020-07-20 DIAGNOSIS — K660 Peritoneal adhesions (postprocedural) (postinfection): Secondary | ICD-10-CM | POA: Diagnosis not present

## 2020-07-20 DIAGNOSIS — R319 Hematuria, unspecified: Secondary | ICD-10-CM | POA: Diagnosis not present

## 2020-07-20 DIAGNOSIS — F17290 Nicotine dependence, other tobacco product, uncomplicated: Secondary | ICD-10-CM | POA: Diagnosis not present

## 2020-07-20 DIAGNOSIS — I888 Other nonspecific lymphadenitis: Secondary | ICD-10-CM | POA: Diagnosis not present

## 2020-07-20 DIAGNOSIS — Z20822 Contact with and (suspected) exposure to covid-19: Secondary | ICD-10-CM | POA: Diagnosis not present

## 2020-07-20 DIAGNOSIS — I2510 Atherosclerotic heart disease of native coronary artery without angina pectoris: Secondary | ICD-10-CM | POA: Diagnosis not present

## 2020-07-20 DIAGNOSIS — C189 Malignant neoplasm of colon, unspecified: Secondary | ICD-10-CM | POA: Diagnosis not present

## 2020-07-20 DIAGNOSIS — D869 Sarcoidosis, unspecified: Secondary | ICD-10-CM | POA: Diagnosis not present

## 2020-07-20 DIAGNOSIS — I10 Essential (primary) hypertension: Secondary | ICD-10-CM | POA: Diagnosis not present

## 2020-07-20 DIAGNOSIS — R310 Gross hematuria: Secondary | ICD-10-CM | POA: Diagnosis not present

## 2020-07-20 DIAGNOSIS — C19 Malignant neoplasm of rectosigmoid junction: Secondary | ICD-10-CM | POA: Diagnosis not present

## 2020-07-20 DIAGNOSIS — R338 Other retention of urine: Secondary | ICD-10-CM | POA: Diagnosis not present

## 2020-07-23 ENCOUNTER — Inpatient Hospital Stay: Payer: Medicare PPO | Admitting: Nurse Practitioner

## 2020-07-28 ENCOUNTER — Other Ambulatory Visit: Payer: Self-pay

## 2020-07-28 ENCOUNTER — Observation Stay (HOSPITAL_COMMUNITY)
Admission: EM | Admit: 2020-07-28 | Discharge: 2020-07-29 | Disposition: A | Discharge status: Home / Self Care | Payer: Medicare PPO | Attending: Urology | Admitting: Urology

## 2020-07-28 DIAGNOSIS — Z7901 Long term (current) use of anticoagulants: Secondary | ICD-10-CM | POA: Diagnosis not present

## 2020-07-28 DIAGNOSIS — Z85828 Personal history of other malignant neoplasm of skin: Secondary | ICD-10-CM | POA: Diagnosis not present

## 2020-07-28 DIAGNOSIS — Z8546 Personal history of malignant neoplasm of prostate: Secondary | ICD-10-CM | POA: Diagnosis not present

## 2020-07-28 DIAGNOSIS — I2510 Atherosclerotic heart disease of native coronary artery without angina pectoris: Secondary | ICD-10-CM | POA: Diagnosis not present

## 2020-07-28 DIAGNOSIS — F17290 Nicotine dependence, other tobacco product, uncomplicated: Secondary | ICD-10-CM | POA: Diagnosis not present

## 2020-07-28 DIAGNOSIS — Z20822 Contact with and (suspected) exposure to covid-19: Secondary | ICD-10-CM | POA: Diagnosis not present

## 2020-07-28 DIAGNOSIS — R319 Hematuria, unspecified: Secondary | ICD-10-CM | POA: Diagnosis present

## 2020-07-28 DIAGNOSIS — I10 Essential (primary) hypertension: Secondary | ICD-10-CM | POA: Insufficient documentation

## 2020-07-28 DIAGNOSIS — R339 Retention of urine, unspecified: Secondary | ICD-10-CM | POA: Diagnosis not present

## 2020-07-28 DIAGNOSIS — Z79899 Other long term (current) drug therapy: Secondary | ICD-10-CM | POA: Insufficient documentation

## 2020-07-28 DIAGNOSIS — Z85038 Personal history of other malignant neoplasm of large intestine: Secondary | ICD-10-CM | POA: Diagnosis not present

## 2020-07-28 DIAGNOSIS — R310 Gross hematuria: Secondary | ICD-10-CM | POA: Diagnosis not present

## 2020-07-28 DIAGNOSIS — N3289 Other specified disorders of bladder: Secondary | ICD-10-CM | POA: Diagnosis not present

## 2020-07-28 DIAGNOSIS — R338 Other retention of urine: Secondary | ICD-10-CM | POA: Diagnosis not present

## 2020-07-28 LAB — BASIC METABOLIC PANEL
Anion gap: 10 (ref 5–15)
Anion gap: 12 (ref 5–15)
BUN: 12 mg/dL (ref 8–23)
BUN: 10 mg/dL (ref 8–23)
CO2: 25 mmol/L (ref 22–32)
CO2: 20 mmol/L — ABNORMAL LOW (ref 22–32)
Calcium: 8.7 mg/dL — ABNORMAL LOW (ref 8.9–10.3)
Calcium: 8.6 mg/dL — ABNORMAL LOW (ref 8.9–10.3)
Chloride: 101 mmol/L (ref 98–111)
Chloride: 100 mmol/L (ref 98–111)
Creatinine, Ser: 0.81 mg/dL (ref 0.61–1.24)
Creatinine, Ser: 0.8 mg/dL (ref 0.61–1.24)
GFR, Estimated: >60 mL/min (ref >60)
GFR, Estimated: >60 mL/min (ref >60)
Glucose, Bld: 128 mg/dL — ABNORMAL HIGH (ref 70–99)
Glucose, Bld: 117 mg/dL — ABNORMAL HIGH (ref 70–99)
Potassium: 4.2 mmol/L (ref 3.5–5.1)
Potassium: 4.1 mmol/L (ref 3.5–5.1)
Sodium: 136 mmol/L (ref 135–145)
Sodium: 132 mmol/L — ABNORMAL LOW (ref 135–145)

## 2020-07-28 LAB — URINALYSIS, ROUTINE W REFLEX MICROSCOPIC
Bacteria, UA: NONE SEEN
Bilirubin Urine: NEGATIVE
Glucose, UA: NEGATIVE mg/dL
Ketones, ur: NEGATIVE mg/dL
Leukocytes,Ua: NEGATIVE
Nitrite: NEGATIVE
Protein, ur: 100 mg/dL — AB
RBC / HPF: >50 RBC/hpf — ABNORMAL HIGH (ref 0–5)
Specific Gravity, Urine: 1.016 (ref 1.005–1.030)
pH: 7 (ref 5.0–8.0)

## 2020-07-28 LAB — CBC
HCT: 33.4 % — ABNORMAL LOW (ref 39.0–52.0)
Hemoglobin: 10.8 g/dL — ABNORMAL LOW (ref 13.0–17.0)
MCH: 30.2 pg (ref 26.0–34.0)
MCHC: 32.3 g/dL (ref 30.0–36.0)
MCV: 93.3 fL (ref 80.0–100.0)
Platelets: 160 10*3/uL (ref 150–400)
RBC: 3.58 MIL/uL — ABNORMAL LOW (ref 4.22–5.81)
RDW: 15.4 % (ref 11.5–15.5)
WBC: 4.9 10*3/uL (ref 4.0–10.5)
nRBC: 0 % (ref 0.0–0.2)

## 2020-07-28 LAB — HEMOGLOBIN AND HEMATOCRIT, BLOOD
HCT: 35.3 % — ABNORMAL LOW (ref 39.0–52.0)
Hemoglobin: 11.3 g/dL — ABNORMAL LOW (ref 13.0–17.0)

## 2020-07-28 LAB — SARS CORONAVIRUS 2 (TAT 6-24 HRS): SARS Coronavirus 2: NEGATIVE

## 2020-07-28 MED ORDER — DIPHENHYDRAMINE HCL 50 MG/ML IJ SOLN
12.5000 mg | Freq: Four times a day (QID) | INTRAMUSCULAR | Status: DC | PRN
Start: 2020-07-28 — End: 2020-07-29

## 2020-07-28 MED ORDER — FLEET ENEMA 7-19 GM/118ML RE ENEM
1.0000 | ENEMA | Freq: Once | RECTAL | Status: AC
  Administered 2020-07-28: 1 via RECTAL
  Filled 2020-07-28: qty 1

## 2020-07-28 MED ORDER — FUROSEMIDE 40 MG PO TABS
40.0000 mg | ORAL_TABLET | Freq: Every day | ORAL | Status: DC
  Administered 2020-07-28 – 2020-07-29 (×2): 40 mg via ORAL
  Filled 2020-07-28: qty 2
  Filled 2020-07-28: qty 1

## 2020-07-28 MED ORDER — GABAPENTIN 100 MG PO CAPS: 200.0000 mg | ORAL_CAPSULE | Freq: Every day | ORAL | Status: DC

## 2020-07-28 MED ORDER — TRAMADOL HCL 50 MG PO TABS
50.0000 mg | ORAL_TABLET | Freq: Four times a day (QID) | ORAL | Status: DC | PRN
  Administered 2020-07-28 – 2020-07-29 (×2): 100 mg via ORAL
  Filled 2020-07-28 (×2): qty 2

## 2020-07-28 MED ORDER — ASPIRIN 81 MG PO CHEW
81.0000 mg | CHEWABLE_TABLET | Freq: Every day | ORAL | Status: DC
  Administered 2020-07-28 – 2020-07-29 (×2): 81 mg via ORAL
  Filled 2020-07-28 (×2): qty 1

## 2020-07-28 MED ORDER — ACETAMINOPHEN 500 MG PO TABS
1000.0000 mg | ORAL_TABLET | Freq: Once | ORAL | Status: AC
  Administered 2020-07-28: 1000 mg via ORAL
  Filled 2020-07-28: qty 2

## 2020-07-28 MED ORDER — DOXYCYCLINE HYCLATE 100 MG PO TABS: 100.0000 mg | ORAL_TABLET | Freq: Two times a day (BID) | ORAL | Status: DC

## 2020-07-28 MED ORDER — CHLORHEXIDINE GLUCONATE CLOTH 2 % EX PADS
6.0000 | MEDICATED_PAD | Freq: Every day | CUTANEOUS | Status: DC
  Administered 2020-07-29: 6 via TOPICAL

## 2020-07-28 MED ORDER — SODIUM CHLORIDE 0.9 % IR SOLN: 3000.0000 mL | Status: DC

## 2020-07-28 MED ORDER — ACETAMINOPHEN 325 MG PO TABS
650.0000 mg | ORAL_TABLET | ORAL | Status: DC | PRN
  Administered 2020-07-29: 650 mg via ORAL
  Filled 2020-07-28: qty 2

## 2020-07-28 MED ORDER — RANOLAZINE ER 500 MG PO TB12
500.0000 mg | ORAL_TABLET | Freq: Two times a day (BID) | ORAL | Status: DC
  Administered 2020-07-29 (×2): 500 mg via ORAL
  Filled 2020-07-28 (×3): qty 1

## 2020-07-28 MED ORDER — AMLODIPINE BESYLATE 5 MG PO TABS
5.0000 mg | ORAL_TABLET | Freq: Two times a day (BID) | ORAL | Status: DC
  Administered 2020-07-28 – 2020-07-29 (×2): 5 mg via ORAL
  Filled 2020-07-28 (×2): qty 1

## 2020-07-28 MED ORDER — BELLADONNA ALKALOIDS-OPIUM 16.2-60 MG RE SUPP
1.0000 | Freq: Three times a day (TID) | RECTAL | Status: DC | PRN
  Administered 2020-07-28: 1 via RECTAL
  Filled 2020-07-28: qty 1

## 2020-07-28 MED ORDER — FLUTICASONE FUROATE-VILANTEROL 200-25 MCG/INH IN AEPB
1.0000 | INHALATION_SPRAY | Freq: Every day | RESPIRATORY_TRACT | Status: DC
  Filled 2020-07-28: qty 28

## 2020-07-28 MED ORDER — NITROGLYCERIN 0.4 MG SL SUBL: 0.4000 mg | SUBLINGUAL_TABLET | SUBLINGUAL | Status: DC | PRN

## 2020-07-28 MED ORDER — ONDANSETRON HCL 4 MG/2ML IJ SOLN: 4.0000 mg | INTRAMUSCULAR | Status: DC | PRN

## 2020-07-28 MED ORDER — ROSUVASTATIN CALCIUM 20 MG PO TABS
20.0000 mg | ORAL_TABLET | Freq: Every day | ORAL | Status: DC
  Administered 2020-07-28 – 2020-07-29 (×2): 20 mg via ORAL
  Filled 2020-07-28: qty 4
  Filled 2020-07-28: qty 1

## 2020-07-28 MED ORDER — DIPHENHYDRAMINE HCL 12.5 MG/5ML PO ELIX
12.5000 mg | ORAL_SOLUTION | Freq: Four times a day (QID) | ORAL | Status: DC | PRN
Start: 2020-07-28 — End: 2020-07-29

## 2020-07-28 MED ORDER — SODIUM CHLORIDE 0.9 % IR SOLN
3000.0000 mL | Status: DC
  Administered 2020-07-28 (×2): 3000 mL

## 2020-07-28 MED ORDER — DOCUSATE SODIUM 100 MG PO CAPS
100.0000 mg | ORAL_CAPSULE | Freq: Two times a day (BID) | ORAL | Status: DC
  Administered 2020-07-28 – 2020-07-29 (×2): 100 mg via ORAL
  Filled 2020-07-28 (×2): qty 1

## 2020-07-28 NOTE — ED Notes (Signed)
Unable to obtain supplies for continuous irrigation at this time. Have tried supplies, called urology floor and ordering it. Will continue to find resources

## 2020-07-28 NOTE — ED Notes (Signed)
Pt exhibiting urinary retention, bladder scan results shared with MD Cardama, indwelling foley catheter inserted by Randall Hiss NT & Jinny Blossom RN

## 2020-07-28 NOTE — ED Notes (Signed)
CBI stopped and pt is awaiting urology

## 2020-07-28 NOTE — ED Notes (Signed)
MD paged for pain meds RE likely bladder spasms.  CBI rate decreased to reduce pain, with some improvement.

## 2020-07-28 NOTE — ED Provider Notes (Addendum)
[facility] EMERGENCY DEPARTMENT Provider Note   CSN: [id number] Arrival date & time: 07/28/20  0154     History Chief Complaint  Patient presents with  . Hematuria  . Dysuria    Luis [name] is a 80 y.o. male history of adenocarcinoma of the cecum, prostate cancer, metastases, CAD, obesity, hypertension, hyperlipidemia, sarcoidosis.  Patient had hepatic arterial infusion pump placed 06/22/2020.  Patient reports that during that hospitalization he was having difficulty with constipation and urinary retention he had a temporary Foley placed.  He reports that his urination improved and he is able to void without difficulty, he was discharged home.  Over the last 1-2 days his urinary retention has gradually worsened he reports he was unable to urinate yesterday.  Prior to my evaluation patient had Foley catheter placed by nursing staff after bladder scan showed 635 mL.  Patient reports that since that was placed over an hour ago he is feeling much better and has no complaints he reports that his urinary retention was associated with a full pressure-like feeling which was moderate in intensity at this lower abdomen, nonradiating and since completely resolved.  He reports he has no complaints at this time.  He reports he is a follow-up appointment with his urologist at [facility] on August 09, 2020.  Denies fever/chills, chest pain, nausea/vomiting, abdominal pain, testicular pain/swelling or any additional complaints.  HPI     Past Medical History:  Diagnosis Date  . Adenocarcinoma of cecum ([place]) 06/21/2017  . Arthritis    "mid back; hands; knees" (02/24/2015)  . Basal cell carcinoma    left shoulder; mid chest; right eyelid (02/24/2015)  . CAD (coronary artery disease)    a. 08/2014 Cath/PCI: LM nl, LAD 30p, D1 95 (2.25x12 Resolute Integrity DES), LCX small, RI 100 (attempted PCI) - branches fill via L->L collats, RCA dominant, nl, RPDA/PLA nl, EF 60^. b. 02/24/2015 PCI CTO  of Ramus DES x2.  . Chronic bronchitis (HCC)    hx  . Diverticulosis 05/2005  . Elevated lipase   . Fuchs' corneal dystrophy   . History of adenomatous polyp of colon 05/2005   8 mm adenoma  . History of blood transfusion    "related to some of my surgeries"  . Hyperlipidemia   . Hypertension   . Iron deficiency anemia due to chronic blood loss 06/21/2017  . Pneumonia   . Prostate cancer ([place]) 2011   S/P seed implant  . Sarcoidosis   . Thrombocytopenia ([place])    a. Noted on prior labs, unclear of what w/u done.  . Trifascicular block     Patient Active Problem List   Diagnosis Date Noted  . Adenocarcinoma ([place])   . Smoker 02/04/2020  . Liver metastases ([place]) 08/26/2019  . Port-A-Cath in place 03/26/2019  . Goals of care, counseling/discussion 02/25/2019  . Carotid stenosis 01/07/2019  . Cecal cancer ([place]) 07/26/2017  . Adenocarcinoma of cecum ([place]) 06/21/2017  . Iron deficiency anemia due to chronic blood loss 06/21/2017  . DOE (dyspnea on exertion) 03/30/2015  . Obesity (BMI 30-39.9) 03/30/2015  . Trifascicular block   . Nonspecific chest pain 02/24/2015  . CAD (coronary artery disease) 08/13/2014  . Essential hypertension 08/13/2014  . Hyperlipidemia 08/13/2014  . Pulmonary fibrosis ([place]) 08/13/2014  . Sarcoidosis   . Syncope 01/29/2012    Past Surgical History:  Procedure Laterality Date  . BASAL CELL CARCINOMA EXCISION Left    shoulder  . CARDIAC CATHETERIZATION N/A 02/24/2015   Procedure:  Coronary/Bypass Graft CTO Intervention;  Surgeon: Jettie Booze, MD;  Location: [facility] CV LAB;  Service: Cardiovascular;  Laterality: N/A;  . CARDIAC CATHETERIZATION  02/24/2015   Procedure: Coronary/Graft Atherectomy;  Surgeon: Jettie Booze, MD;  Location: [facility];  Service: Cardiovascular;;  . CATARACT EXTRACTION W/ INTRAOCULAR LENS  IMPLANT, BILATERAL Bilateral ~ 2011-2012  . COLONOSCOPY W/ POLYPECTOMY  06/08/2005 and 10/18/2010   8 mm adenoma  2006, none 2012. Diverticulosis and internal hemorrhoids.  . CORNEAL TRANSPLANT Bilateral ~ 2011-2012   "@ same time as cataract OR"  . CORONARY ANGIOPLASTY WITH STENT PLACEMENT  08/2014; 02/24/2015   "1 stent + 1 stent"  . CORONARY STENT INTERVENTION N/A 01/02/2019   Procedure: CORONARY STENT INTERVENTION;  Surgeon: Jettie Booze, MD;  Location: [place] [facility];  Service: Cardiovascular;  Laterality: N/A;  . EYE SURGERY    . FINGER SURGERY Right 2014   "reattached middle finger"  . INSERTION PROSTATE RADIATION SEED  ~ 2011  . IR IMAGING GUIDED PORT INSERTION  03/04/2019  . IR RADIOLOGIST EVAL & MGMT  05/26/2020  . IR US GUIDE BX ASP/DRAIN  03/04/2019  . LAPAROSCOPIC CHOLECYSTECTOMY  2015  . LAPAROSCOPIC PARTIAL COLECTOMY N/A 07/26/2017   Procedure: LAPAROSCOPIC PARTIAL COLECTOMY, EXCISION SCROTAL CYST;  Surgeon: Greer Pickerel, MD;  Location: [facility];  Service: General;  Laterality: N/A;  . LEFT HEART CATH AND CORONARY ANGIOGRAPHY N/A 01/02/2019   Procedure: LEFT HEART CATH AND CORONARY ANGIOGRAPHY;  Surgeon: Jettie Booze, MD;  Location: [place] [facility] LAB;  Service: Cardiovascular;  Laterality: N/A;  . LEFT HEART CATHETERIZATION WITH CORONARY ANGIOGRAM N/A 08/12/2014   Procedure: LEFT HEART CATHETERIZATION WITH CORONARY ANGIOGRAM;  Surgeon: Blane Ohara, MD;  Location: [facility] CATH LAB;  Service: Cardiovascular;  Laterality: N/A;  . LYMPH NODE BIOPSY     mid chest  . MOHS SURGERY Right ~ 2011   "eyelid; for basal cell"  . RADIOLOGY WITH ANESTHESIA N/A 06/30/2020   Procedure: IR WITH ANESTHESIA MICROWAVE ABLATION;  Surgeon: Corrie Mckusick, DO;  Location: [facility];  Service: Anesthesiology;  Laterality: N/A;  . THOROCOTOMY WITH LOBECTOMY Right ~ 1991   partial removal right lung  . TONSILLECTOMY  ~ 1950       Family History  Problem Relation Age of Onset  . Heart disease Father 78       Died from "hardening of the arteries" age 35  . Coronary artery disease Sister 62  . Heart  attack Sister   . Colon cancer Neg Hx   . Throat cancer Neg Hx   . Pancreatic cancer Neg Hx   . Prostate cancer Neg Hx   . Stroke Neg Hx   . Hypertension Neg Hx     Social History   Tobacco Use  . Smoking status: Light Tobacco Smoker    Packs/day: 0.00    Years: 59.00    Pack years: 0.00    Types: Cigars  . Smokeless tobacco: Never Used  . Tobacco comment: 02/24/2015 "quit cigarettes ~ 2000 ago but smokes cigars ionce/month"  Vaping Use  . Vaping Use: Never used  Substance Use Topics  . Alcohol use: Yes    Alcohol/week: 2.0 standard drinks    Types: 1 Glasses of wine, 1 Standard drinks or equivalent per week    Comment: Drinks maybe 1-2 drinks a week or less  . Drug use: No    Home Medications Prior to Admission medications   Medication Sig Start Date  End Date Taking? Authorizing Provider  amLODipine (NORVASC) 5 MG tablet TAKE 1 TABLET BY MOUTH TWICE A DAY Patient taking differently: Take 5 mg by mouth 2 (two) times daily. 12/30/19   Jettie Booze, MD  CANNABIDIOL PO Take 12 drops by mouth 2 (two) times a day. CBD OIL    [provider]  Cholecalciferol (VITAMIN D3) 2000 units capsule Take 2,000 Units by mouth every evening.     [provider]  clopidogrel (PLAVIX) 75 MG tablet Take 1 tablet (75 mg total) by mouth daily. 05/28/20   Jettie Booze, MD  doxycycline (VIBRA-TABS) 100 MG tablet TAKE 1 TABLET BY MOUTH TWICE A DAY Patient taking differently: Take 100 mg by mouth 2 (two) times daily. 04/28/20   Ladell Pier, MD  fluticasone (CUTIVATE) 0.05 % cream APPLY TO AFFECTED AREAS ON FACE 2 TIMES DAILY Patient taking differently: Apply 1 application topically 2 (two) times daily. 06/15/20   Ladell Pier, MD  fluticasone furoate-vilanterol (BREO ELLIPTA) 200-25 MCG/INH AEPB Inhale 1 puff into the lungs daily. 04/28/20   Hunsucker, Bonna Gains, MD  furosemide (LASIX) 40 MG tablet Take 1 tablet (40 mg total) by mouth daily. 06/16/20   Jettie Booze, MD  gabapentin (NEURONTIN) 100 MG capsule Take 2 capsules (200 mg total) by mouth at bedtime. 03/26/20   Ladell Pier, MD  naproxen sodium (ALEVE) 220 MG tablet Take 440 mg by mouth daily.    [provider]  nitroGLYCERIN (NITROSTAT) 0.4 MG SL tablet PLACE 1 TABLET UNDER THE TONGUE EVERY 5 MINUTES AS NEEDED FOR CHEST PAIN FOR 3 DOSES Patient taking differently: Place 0.4 mg under the tongue every 5 (five) minutes as needed for chest pain. 01/16/20   Jettie Booze, MD  Polyethylene Glycol 3350 (MIRALAX PO) Take 17 g by mouth daily as needed (constipation).    [provider]  prochlorperazine (COMPAZINE) 10 MG tablet Take 1 tablet (10 mg total) by mouth every 6 (six) hours as needed. Patient taking differently: Take 10 mg by mouth every 6 (six) hours as needed for vomiting or nausea. 02/26/19   Ladell Pier, MD  ranolazine (RANEXA) 500 MG 12 hr tablet Take 1 tablet (500 mg total) by mouth 2 (two) times daily. 07/14/20   Jettie Booze, MD  rosuvastatin (CRESTOR) 20 MG tablet TAKE 1 TABLET BY MOUTH EVERY DAY Patient taking differently: Take 20 mg by mouth daily. 09/29/19   Jettie Booze, MD  traMADol (ULTRAM) 50 MG tablet Take 50-100 mg by mouth every 6 (six) hours as needed for moderate pain or severe pain.    [provider]    Allergies    Patient has no known allergies.  Review of Systems   Review of Systems Ten systems are reviewed and are negative for acute change except as noted in the HPI  Physical Exam Updated Vital Signs BP (!) 149/76   Pulse 78   Temp 97.9 F (36.6 C) (Oral)   Resp 17   SpO2 99%   Physical Exam Constitutional:      General: He is not in acute distress.    Appearance: Normal appearance. He is well-developed. He is not ill-appearing or diaphoretic.  HENT:     Head: Normocephalic and atraumatic.  Eyes:     General: Vision grossly intact. Gaze aligned appropriately.     Pupils: Pupils are equal,  round, and reactive to light.  Neck:     Trachea: Trachea and phonation  normal.  Pulmonary:     Effort: Pulmonary effort is normal. No respiratory distress.  Abdominal:     General: There is no distension.     Palpations: Abdomen is soft.     Tenderness: There is no abdominal tenderness. There is no guarding or rebound.     Comments: Postop incisions and bruising of the abdomen noted without signs of infection.  Musculoskeletal:        General: Normal range of motion.     Cervical back: Normal range of motion.  Skin:    General: Skin is warm and dry.  Neurological:     Mental Status: He is alert.     GCS: GCS eye subscore is 4. GCS verbal subscore is 5. GCS motor subscore is 6.     Comments: Speech is clear and goal oriented, follows commands Major Cranial nerves without deficit, no facial droop Moves extremities without ataxia, coordination intact  Psychiatric:        Behavior: Behavior normal.     ED Results / Procedures / Treatments   Labs (all labs ordered are listed, but only abnormal results are displayed) Labs Reviewed  URINALYSIS, ROUTINE W REFLEX MICROSCOPIC - Abnormal; Notable for the following components:      Result Value   Color, Urine AMBER (*)    APPearance CLOUDY (*)    Hgb urine dipstick LARGE (*)    Protein, ur 100 (*)    RBC / HPF >50 (*)    All other components within normal limits  CBC - Abnormal; Notable for the following components:   RBC 3.58 (*)    Hemoglobin 10.8 (*)    HCT 33.4 (*)    All other components within normal limits  BASIC METABOLIC PANEL - Abnormal; Notable for the following components:   Glucose, Bld 128 (*)    Calcium 8.7 (*)    All other components within normal limits  URINE CULTURE    EKG None  Radiology No results found.  Procedures Procedures   Medications Ordered in ED Medications - No data to display  ED Course  I have reviewed the triage vital signs and the nursing notes.  Pertinent labs & imaging results  that were available during my care of the patient were reviewed by me and considered in my medical decision making (see chart for details).    MDM Rules/Calculators/A&P                         Additional history obtained from: 1. Nursing notes from this visit. 2. Review of electronic medical records.  I was able to review patient's urology note from 07/26/2020.  They believe urinary retention was likely multifactorial due to recent surgery/anesthesia decreased ambulation, BPH and some element of clot retention.  They recommended aggressive bowel regimen, ambulation and tamsulosin for discharge and did a voiding trial while he was admitted.  His hematuria resolved and they thought that it may be due to prostatic bleeding after recent catheter removal given aspirin, heparin and Plavix during admission.  They did consider other etiologies such as radiation cystitis and brachytherapy or urothelial malignancy.  They obtain CT GU which did not give an obvious cause of hematuria.  They plan outpatient cystoscopy on 08/10/2019. ----- I reviewed and interpreted labs which include: CBC shows anemia of 10.8, CBC on 07/26/2020 at [facility] is 10.5.  Anemia slightly improved from 2 days ago.  No leukocytosis to suggest infectious process. BMP shows  no emergent electrolyte derangement AKI or gap Urinalysis shows RBCs and hemoglobin, no nitrites leukocytes or WBCs to suggest infection.  Urine cultures pending.  Patient's vital signs are stable, symptoms resolved following insertion of Foley catheter.  Shared decision making made with patient, he is asking to be discharged to home, feel patient stress is reasonable at this time, I have encouraged him to call his urologist office today to schedule a follow-up appointment sooner than the 28th.  No indication for antibiotics or imaging at this time.  At this time there does not appear to be any evidence of an acute emergency medical condition and the patient appears  stable for discharge with appropriate outpatient follow up. Diagnosis was discussed with patient who verbalizes understanding of care plan and is agreeable to discharge. I have discussed return precautions with patient who verbalizes understanding. Patient encouraged to follow-up with their PCP and Urology. All questions answered.  Patient's case discussed with Dr. Ronnald Nian who agrees with plan to discharge with follow-up.  =========================== Addendum: Patient is fully clotted, nursing staff attempted multiple times to irrigate and even change the Foley out.  Unfortunately this problem continued, I consulted urology and spoke to Dr. Alinda Money at 12:30 PM, he is asked that we obtain the urology cart from the OR and he is on his way to evaluate the patient. - 2:45 PM: Patient seen and evaluated by Dr. Alinda Money, Dr. Alinda Money is admitting patient to his service.    Note: Portions of this report may have been transcribed using voice recognition software. Every effort was made to ensure accuracy; however, inadvertent computerized transcription errors may still be present. Final Clinical Impression(s) / ED Diagnoses Final diagnoses:  Urinary retention  Hematuria, unspecified type    Rx / DC Orders ED Discharge Orders    None       Gari Crown 07/28/20 0757    Lennice Sites, DO 07/28/20 0802    Deliah Boston, PA-C 07/28/20 1445    Lennice Sites, DO 07/28/20 1452

## 2020-07-28 NOTE — ED Notes (Signed)
Pt c/o urinary retention, nothing draining from foley at this time

## 2020-07-28 NOTE — H&P (Signed)
H&P  Chief Complaint: Hematuria with clot retention  History of Present Illness: Luis [name] is a 80 year old gentleman with a distant history of prostate cancer s/p brachytherapy about 10 years ago.  He has been most recently followed by Dr. Louis Meckel and was seen by him last in the spring of 2020.  He does have metastatic colon cancer to the liver followed by Dr. Benay Spice.  He has been treated with systemic therapy and underwent treatment at [facility] last Friday with intra-arterial chemotherapy to his hepatic metastases.  He did develop gross hematuria at the time felt to most likely be related to urethral catheter trauma.  Urology was consulted and he had two catheters during his admission with irrigation of clots and CBI.  He was discharged Sunday without a catheter after his urine had cleared.  A hematuria protocol CT scan was performed during his admission and was unremarkable for an etiology.  He developed intermittent hematuria and passed few clots before developing urinary retention this morning causing him to come to the ED.  He had a regular 16 Fr catheter placed that returned red urine and did not drain well.  He then had an 18 Fr 3 way catheter placed by the ER staff with similar issues.   He does have a history of CAD with history of cardiac stent many years ago.  He has been off Plavix since early last week and has been on ASA 81 mg.    Past Medical History:  Diagnosis Date  . Adenocarcinoma of cecum ([place]) 06/21/2017  . Arthritis    "mid back; hands; knees" (02/24/2015)  . Basal cell carcinoma    left shoulder; mid chest; right eyelid (02/24/2015)  . CAD (coronary artery disease)    a. 08/2014 Cath/PCI: LM nl, LAD 30p, D1 95 (2.25x12 Resolute Integrity DES), LCX small, RI 100 (attempted PCI) - branches fill via L->L collats, RCA dominant, nl, RPDA/PLA nl, EF 60^. b. 02/24/2015 PCI CTO of Ramus DES x2.  . Chronic bronchitis (HCC)    hx  . Diverticulosis 05/2005  . Elevated lipase   . Fuchs'  corneal dystrophy   . History of adenomatous polyp of colon 05/2005   8 mm adenoma  . History of blood transfusion    "related to some of my surgeries"  . Hyperlipidemia   . Hypertension   . Iron deficiency anemia due to chronic blood loss 06/21/2017  . Pneumonia   . Prostate cancer ([place]) 2011   S/P seed implant  . Sarcoidosis   . Thrombocytopenia ([place])    a. Noted on prior labs, unclear of what w/u done.  . Trifascicular block     Past Surgical History:  Procedure Laterality Date  . BASAL CELL CARCINOMA EXCISION Left    shoulder  . CARDIAC CATHETERIZATION N/A 02/24/2015   Procedure: Coronary/Bypass Graft CTO Intervention;  Surgeon: Jettie Booze, MD;  Location: [place];  Service: Cardiovascular;  Laterality: N/A;  . CARDIAC CATHETERIZATION  02/24/2015   Procedure: Coronary/Graft Atherectomy;  Surgeon: Jettie Booze, MD;  Location: [place] [facility];  Service: Cardiovascular;;  . CATARACT EXTRACTION W/ INTRAOCULAR LENS  IMPLANT, BILATERAL Bilateral ~ 2011-2012  . COLONOSCOPY W/ POLYPECTOMY  06/08/2005 and 10/18/2010   8 mm adenoma 2006, none 2012. Diverticulosis and internal hemorrhoids.  . CORNEAL TRANSPLANT Bilateral ~ 2011-2012   "@ same time as cataract OR"  . CORONARY ANGIOPLASTY WITH STENT PLACEMENT  08/2014; 02/24/2015   "1 stent + 1 stent"  .  CORONARY STENT INTERVENTION N/A 01/02/2019   Procedure: CORONARY STENT INTERVENTION;  Surgeon: Jettie Booze, MD;  Location: [place] [facility];  Service: Cardiovascular;  Laterality: N/A;  . EYE SURGERY    . FINGER SURGERY Right 2014   "reattached middle finger"  . INSERTION PROSTATE RADIATION SEED  ~ 2011  . IR IMAGING GUIDED PORT INSERTION  03/04/2019  . IR RADIOLOGIST EVAL & MGMT  05/26/2020  . IR US GUIDE BX ASP/DRAIN  03/04/2019  . LAPAROSCOPIC CHOLECYSTECTOMY  2015  . LAPAROSCOPIC PARTIAL COLECTOMY N/A 07/26/2017   Procedure: LAPAROSCOPIC PARTIAL COLECTOMY, EXCISION SCROTAL CYST;  Surgeon: Greer Pickerel, MD;  Location: [facility];  Service: General;  Laterality: N/A;  . LEFT HEART CATH AND CORONARY ANGIOGRAPHY N/A 01/02/2019   Procedure: LEFT HEART CATH AND CORONARY ANGIOGRAPHY;  Surgeon: Jettie Booze, MD;  Location: [place] [facility];  Service: Cardiovascular;  Laterality: N/A;  . LEFT HEART CATHETERIZATION WITH CORONARY ANGIOGRAM N/A 08/12/2014   Procedure: LEFT HEART CATHETERIZATION WITH CORONARY ANGIOGRAM;  Surgeon: Blane Ohara, MD;  Location: [facility];  Service: Cardiovascular;  Laterality: N/A;  . LYMPH NODE BIOPSY     mid chest  . MOHS SURGERY Right ~ 2011   "eyelid; for basal cell"  . RADIOLOGY WITH ANESTHESIA N/A 06/30/2020   Procedure: IR WITH ANESTHESIA MICROWAVE ABLATION;  Surgeon: Corrie Mckusick, DO;  Location: [facility];  Service: Anesthesiology;  Laterality: N/A;  . THOROCOTOMY WITH LOBECTOMY Right ~ 1991   partial removal right lung  . TONSILLECTOMY  ~ 1950    Home Medications:  No current facility-administered medications on file prior to encounter.   Current Outpatient Medications on File Prior to Encounter  Medication Sig Dispense Refill  . amLODipine (NORVASC) 5 MG tablet TAKE 1 TABLET BY MOUTH TWICE A DAY (Patient taking differently: Take 5 mg by mouth 2 (two) times daily.) 180 tablet 3  . CANNABIDIOL PO Take 12 drops by mouth 2 (two) times a day. CBD OIL    . Cholecalciferol (VITAMIN D3) 2000 units capsule Take 2,000 Units by mouth every evening.     . clopidogrel (PLAVIX) 75 MG tablet Take 1 tablet (75 mg total) by mouth daily. 90 tablet 0  . doxycycline (VIBRA-TABS) 100 MG tablet TAKE 1 TABLET BY MOUTH TWICE A DAY (Patient taking differently: Take 100 mg by mouth 2 (two) times daily.) 60 tablet 4  . fluticasone (CUTIVATE) 0.05 % cream APPLY TO AFFECTED AREAS ON FACE 2 TIMES DAILY (Patient taking differently: Apply 1 application topically 2 (two) times daily.) 30 g 2  . fluticasone furoate-vilanterol (BREO ELLIPTA) 200-25 MCG/INH AEPB Inhale 1 puff into  the lungs daily. 180 each 11  . furosemide (LASIX) 40 MG tablet Take 1 tablet (40 mg total) by mouth daily. 90 tablet 3  . gabapentin (NEURONTIN) 100 MG capsule Take 2 capsules (200 mg total) by mouth at bedtime. 60 capsule 1  . naproxen sodium (ALEVE) 220 MG tablet Take 440 mg by mouth daily.    . nitroGLYCERIN (NITROSTAT) 0.4 MG SL tablet PLACE 1 TABLET UNDER THE TONGUE EVERY 5 MINUTES AS NEEDED FOR CHEST PAIN FOR 3 DOSES (Patient taking differently: Place 0.4 mg under the tongue every 5 (five) minutes as needed for chest pain.) 25 tablet 4  . Polyethylene Glycol 3350 (MIRALAX PO) Take 17 g by mouth daily as needed (constipation).    . prochlorperazine (COMPAZINE) 10 MG tablet Take 1 tablet (10 mg total) by mouth every 6 (six) hours  as needed. (Patient taking differently: Take 10 mg by mouth every 6 (six) hours as needed for vomiting or nausea.) 60 tablet 1  . ranolazine (RANEXA) 500 MG 12 hr tablet Take 1 tablet (500 mg total) by mouth 2 (two) times daily. 180 tablet 3  . rosuvastatin (CRESTOR) 20 MG tablet TAKE 1 TABLET BY MOUTH EVERY DAY (Patient taking differently: Take 20 mg by mouth daily.) 90 tablet 2  . traMADol (ULTRAM) 50 MG tablet Take 50-100 mg by mouth every 6 (six) hours as needed for moderate pain or severe pain.       Allergies: No Known Allergies  Family History  Problem Relation Age of Onset  . Heart disease Father 20       Died from "hardening of the arteries" age 41  . Coronary artery disease Sister 31  . Heart attack Sister   . Colon cancer Neg Hx   . Throat cancer Neg Hx   . Pancreatic cancer Neg Hx   . Prostate cancer Neg Hx   . Stroke Neg Hx   . Hypertension Neg Hx     Social History:  reports that he has been smoking cigars. He has been smoking about 0.00 packs per day for the past 59.00 years. He has never used smokeless tobacco. He reports current alcohol use of about 2.0 standard drinks of alcohol per week. He reports that he does not use drugs.  ROS: A  complete review of systems was performed.  All systems are negative except for pertinent findings as noted.  Physical Exam:  Vital signs in last 24 hours: Temp:  [97.6 F (36.4 C)-98.2 F (36.8 C)] 98.2 F (36.8 C) (02/16 0802) Pulse Rate:  [74-91] 76 (02/16 1115) Resp:  [16-19] 18 (02/16 1115) BP: (136-177)/(66-76) 149/66 (02/16 1115) SpO2:  [96 %-100 %] 99 % (02/16 1115) Constitutional:  Alert and oriented, No acute distress Cardiovascular: Regular rate and rhythm, No JVD Respiratory: Normal respiratory effort GI: Abdomen is soft, nontender, nondistended, no abdominal masses Genitourinary: No CVAT. Normal male phallus, testes are descended bilaterally and non-tender and without masses, scrotum is normal in appearance without lesions or masses, perineum is normal on inspection.  He has indwelling 18 Fr 3 way catheter with red tinged urine draining. Lymphatic: No lymphadenopathy Neurologic: Grossly intact, no focal deficits Psychiatric: Normal mood and affect  Laboratory Data:  Recent Labs    07/28/20 0225  WBC 4.9  HGB 10.8*  HCT 33.4*  PLT 160    Recent Labs    07/28/20 0225  NA 136  K 4.2  CL 101  GLUCOSE 128*  BUN 12  CALCIUM 8.7*  CREATININE 0.81     Results for orders placed or performed during the hospital encounter of 07/28/20 (from the past 24 hour(s))  CBC     Status: Abnormal   Collection Time: 07/28/20  2:25 AM  Result Value Ref Range   WBC 4.9 4.0 - 10.5 K/uL   RBC 3.58 (L) 4.22 - 5.81 MIL/uL   Hemoglobin 10.8 (L) 13.0 - 17.0 g/dL   HCT 33.4 (L) 39.0 - 52.0 %   MCV 93.3 80.0 - 100.0 fL   MCH 30.2 26.0 - 34.0 pg   MCHC 32.3 30.0 - 36.0 g/dL   RDW 15.4 11.5 - 15.5 %   Platelets 160 150 - 400 K/uL   nRBC 0.0 0.0 - 0.2 %  Basic metabolic panel     Status: Abnormal   Collection Time: 07/28/20  2:25 AM  Result Value Ref Range   Sodium 136 135 - 145 mmol/L   Potassium 4.2 3.5 - 5.1 mmol/L   Chloride 101 98 - 111 mmol/L   CO2 25 22 - 32 mmol/L    Glucose, Bld 128 (H) 70 - 99 mg/dL   BUN 12 8 - 23 mg/dL   Creatinine, Ser 0.81 0.61 - 1.24 mg/dL   Calcium 8.7 (L) 8.9 - 10.3 mg/dL   GFR, Estimated >60 >60 mL/min   Anion gap 10 5 - 15  Urinalysis, Routine w reflex microscopic Urine, Clean Catch     Status: Abnormal   Collection Time: 07/28/20  5:49 AM  Result Value Ref Range   Color, Urine AMBER (A) YELLOW   APPearance CLOUDY (A) CLEAR   Specific Gravity, Urine 1.016 1.005 - 1.030   pH 7.0 5.0 - 8.0   Glucose, UA NEGATIVE NEGATIVE mg/dL   Hgb urine dipstick LARGE (A) NEGATIVE   Bilirubin Urine NEGATIVE NEGATIVE   Ketones, ur NEGATIVE NEGATIVE mg/dL   Protein, ur 100 (A) NEGATIVE mg/dL   Nitrite NEGATIVE NEGATIVE   Leukocytes,Ua NEGATIVE NEGATIVE   RBC / HPF >50 (H) 0 - 5 RBC/hpf   WBC, UA 0-5 0 - 5 WBC/hpf   Bacteria, UA NONE SEEN NONE SEEN   No results found for this or any previous visit (from the past 240 hour(s)).  Renal Function: Recent Labs    07/28/20 0225  CREATININE 0.81   CrCl cannot be calculated (Unknown ideal weight.).  Radiologic Imaging:  CT GU protocol inc CT dual abd and pelvis with and without contrast  Anatomical Region Laterality Modality  Abdomen -- Computed Tomography  Pelvis -- --    Narrative Performed by [facility] CT abdomen and pelvis, without and with IV contrast   Comparison: 07/12/2020.   Indication: Hematuria, unknown cause, C18.9 Malignant neoplasm of colon,  unspecified (CMS-HCC), C78.7 Secondary malignant neoplasm of liver and  intrahepatic bile duct (CMS-HCC).   Technique: CT imaging was performed of the abdomen and pelvis without and  with IV contrast. A genitourinary protocol CT was performed, with  noncontrast imaging from the kidneys through the bladder, nephrographic  phase imaging through the kidneys, and excretory phase imaging from the  kidneys through the bladder.  Iodinated contrast was used due to the  indications for the examination, to improve  disease detection and to  further define anatomy. This examination was interpreted using coronal and  sagittal reformatted images to optimize visualization of the collecting  system and ureters.   Findings:  - Lower Thorax: No suspicious pulmonary abnormalities. No pleural or  pericardial effusions.   - Liver: Interval placement of a hepatic arterial infusion pump. There is a  small amount of stranding/fluid within the porta hepatis and adjacent to  the duodenum which is likely postprocedural. Similar size and appearance of  multifocal metastatic liver disease. The portal and hepatic veins are  patent.   - Biliary and Gallbladder: No intrahepatic or extrahepatic bile duct  dilatation. The gallbladder is surgically absent.   - Spleen: Normal in appearance.    - Pancreas: 0.9 cm cystic lesion within the pancreatic body (series 12,  image 21), likely a small sidebranch IPMN (series 13, image 16). No  pancreatic ductal dilatation.   - Adrenal Glands: Normal in appearance.   - Kidneys: Enhance symmetrically. No suspicious renal lesions.  Subcentimeter angiomyolipoma within the superior pole of the right kidney.  No hydronephrosis.   - Abdominal and Pelvic Vasculature: No  abdominal aortic aneurysm. Replaced  common hepatic artery with origin off of the SMA.   - Gastrointestinal Tract: Postsurgical changes status post right  hemicolectomy. No evidence of obstruction.   - Peritoneum/Mesentery/Retroperitoneum: No free fluid.    - Lymph Nodes: No retroperitoneal or mesenteric lymphadenopathy.    - Body Wall: Postprocedural subcutaneous air around the left abdominal wall  and likely a trace amount of air within the preperitoneal space (3:12).   - Musculoskeletal: No aggressive appearing osseous lesions.    Impression:   No CT explanation for hematuria.   Postsurgical changes from recent placement of a hepatic arterial infusion  pump. Similar appearance of metastatic liver  disease.    The preliminary report (critical or emergent communication) was reviewed  prior to this dictation and there are no substantial differences between  the preliminary results and the impressions in this final report.   Electronically Signed by: Keenan Bachelor, MD, [facility]  Electronically Signed on: 07/26/2020 8:09 AM Performed by [facility]  Procedure:  I removed his 18 Fr 3 way catheter after unsuccessfully being able to irrigate it.  He then passed a few large fresh clots.  I prepped him sterilely and inserted a 22 Fr 3 way hematuria catheter.  I irrigated multiple small clots.  The bladder irrigated well but the urine remained slightly red.  I then began CBI with NS on a slow to moderate CBI drip.   Impression/Assessment:  Gross hematuria with clot retention  Plan:  He will be admitted for continuous bladder irrigation with plans to titrate this off overnight.  He will continue ASA 81 mg but hold his Plavix.  I will notify Dr. Louis Meckel of his admission.  He will subsequently need cystoscopy once his urine clear either here or at [facility].  Dutch Gray 07/28/2020, 3:08 PM  Pryor Curia MD   CC: Dr. Burman Nieves

## 2020-07-28 NOTE — ED Notes (Signed)
Pt still unable to urinate 

## 2020-07-28 NOTE — Discharge Instructions (Addendum)
At this time there does not appear to be the presence of an emergent medical condition, however there is always the potential for conditions to change. Please read and follow the below instructions.  1. Please return to the Emergency Department immediately for any new or worsening symptoms. 2. Please be sure to follow up with your Primary Care Provider within one week regarding your visit today; please call their office to schedule an appointment even if you are feeling better for a follow-up visit. 3. Please call your urologist today to schedule a follow-up appointment for your urinary retention, please inform them that you now have a Foley in place and you will need a sooner follow-up than February 28. 4. Your urine is currently being cultured by the lab, if it grows out bacteria that require antibiotics you will be called with a prescription.  Go to the nearest Emergency Department immediately if:  You have fever or chills 1. You have redness, swelling, or pain where the catheter goes into your body. 2. You have fluid, blood, pus, or a bad smell coming from the area where the catheter goes into your body. 3. Your skin feels warm where the catheter goes into your body. 4. You have a fever. 5. You have pain in your: ? Belly (abdomen). ? Legs. ? Lower back. ? Bladder. 6. You see blood in the catheter. 7. Your pee is pink or red. 8. You feel sick to your stomach (nauseous). 9. You throw up (vomit). 10. You have chills. 11. Your pee is not draining into the bag. 12. Your catheter gets pulled out. 13. You have any new/concerning or worsening of symptoms    Please read the additional information packets attached to your discharge summary.  Do not take your medicine if  develop an itchy rash, swelling in your mouth or lips, or difficulty breathing; call 911 and seek immediate emergency medical attention if this occurs.  You may review your lab tests and imaging results in their entirety  on your MyChart account.  Please discuss all results of fully with your primary care provider and other specialist at your follow-up visit.  Note: Portions of this text may have been transcribed using voice recognition software. Every effort was made to ensure accuracy; however, inadvertent computerized transcription errors may still be present.   Foley Catheter Care A soft, flexible tube (Foley catheter) may have been placed in your bladder to drain urine and fluid. Follow these instructions: Taking Care of the Catheter  Keep the area where the catheter leaves your body clean.   Attach the catheter to the leg so there is no tension on the catheter.   Keep the drainage bag below the level of the bladder, but keep it OFF the floor.   Do not take long soaking baths. Your caregiver will give instructions about showering.   Wash your hands before touching ANYTHING related to the catheter or bag.   Using mild soap and warm water on a washcloth:   Clean the area closest to the catheter insertion site using a circular motion around the catheter.   Clean the catheter itself by wiping AWAY from the insertion site for several inches down the tube.   NEVER wipe upward as this could sweep bacteria up into the urethra (tube in your body that normally drains the bladder) and cause infection.   Place a small amount of sterile lubricant at the tip of the penis where the catheter is entering.  Taking Care of  the Drainage Bags  Two drainage bags may be taken home: a large overnight drainage bag, and a smaller leg bag which fits underneath clothing.   It is okay to wear the overnight bag at any time, but NEVER wear the smaller leg bag at night.   Keep the drainage bag well below the level of your bladder. This prevents backflow of urine into the bladder and allows the urine to drain freely.   Anchor the tubing to your leg to prevent pulling or tension on the catheter. Use tape or a leg strap  provided by the hospital.   Empty the drainage bag when it is 1/2 to 3/4 full. Wash your hands before and after touching the bag.   Periodically check the tubing for kinks to make sure there is no pressure on the tubing which could restrict the flow of urine.  Changing the Drainage Bags  Cleanse both ends of the clean bag with alcohol before changing.   Pinch off the rubber catheter to avoid urine spillage during the disconnection.   Disconnect the dirty bag and connect the clean one.   Empty the dirty bag carefully to avoid a urine spill.   Attach the new bag to the leg with tape or a leg strap.  Cleaning the Drainage Bags  Whenever a drainage bag is disconnected, it must be cleaned quickly so it is ready for the next use.   Wash the bag in warm, soapy water.   Rinse the bag thoroughly with warm water.   Soak the bag for 30 minutes in a solution of white vinegar and water (1 cup vinegar to 1 quart warm water).   Rinse with warm water.  SEEK MEDICAL CARE IF:   You have chills or night sweats.   You are leaking around your catheter or have problems with your catheter. It is not uncommon to have sporadic leakage around your catheter as a result of bladder spasms. If the leakage stops, there is not much need for concern. If you are uncertain, call your caregiver.   You develop side effects that you think are coming from your medicines.  SEEK IMMEDIATE MEDICAL CARE IF:   You are suddenly unable to urinate. Check to see if there are any kinks in the drainage tubing that may cause this. If you cannot find any kinks, call your caregiver immediately. This is an emergency.   You develop shortness of breath or chest pains.   Bleeding persists or clots develop in your urine.   You have a fever.   You develop pain in your back or over your lower belly (abdomen).   You develop pain or swelling in your legs.   Any problems you are having get worse rather than better.  MAKE SURE  YOU:   Understand these instructions.   Will watch your condition.   Will get help right away if you are not doing well or get worse.

## 2020-07-28 NOTE — ED Provider Notes (Signed)
I personally evaluated the patient during the encounter and completed a history, physical, procedures, medical decision making to contribute to the overall care of the patient and decision making for the patient briefly, the patient is a 80 y.o. male with history of liver cancer metastasis who presents to the ED with urinary retention.  Patient with unremarkable vitals.  Recently had chemo pump placed at [facility].  Had urinary retention while in the hospital and had a Foley placed.  That eventually cleared.  However started to notice that he was passing clots yesterday.  He had urinary retention upon arrival here.  Foley catheter was placed but still continued to have clots despite irrigation.  Talked with Dr. Alinda Money with urology who will come evaluate the patient.  He is not on any blood thinners.  Plavix was held for surgery.  Patient states that he followed with urology group for his prostate cancer in the past.  He was supposed to follow-up with [facility] urologist for cystoscopy in 2 weeks.  Will appreciate urology recommendations at this time.  This chart was dictated using voice recognition software.  Despite best efforts to proofread,  errors can occur which can change the documentation meaning.     EKG Interpretation None           Lennice Sites, DO 07/28/20 1257

## 2020-07-28 NOTE — ED Notes (Signed)
Bladder scan showed 6108ml

## 2020-07-28 NOTE — ED Triage Notes (Signed)
Pt c/o hematuria/dysuria, was admitted to [facility] for hepatic pump placement, discharged Monday with no issues urinating. States he was severely constipated on discharge, enema took care of "most of that," but states that urinary issues have slowly began since discharge. States it appears to be a repeat of previous issues.

## 2020-07-28 NOTE — ED Notes (Signed)
Urology provider at bedside

## 2020-07-28 NOTE — ED Notes (Signed)
Put in request for [facility] to transport patient to [facility]

## 2020-07-28 NOTE — ED Notes (Signed)
Unable to manually  irrigate foley 50 ml insert without return. PA-C Morelli aware

## 2020-07-28 NOTE — ED Notes (Signed)
Pt foley with clots and bloody urine. Pt concerned for blockage. Dr Ronnald Nian aware and reassessed pt

## 2020-07-29 ENCOUNTER — Other Ambulatory Visit: Payer: Self-pay

## 2020-07-29 DIAGNOSIS — R310 Gross hematuria: Secondary | ICD-10-CM | POA: Diagnosis not present

## 2020-07-29 DIAGNOSIS — Z8546 Personal history of malignant neoplasm of prostate: Secondary | ICD-10-CM | POA: Diagnosis not present

## 2020-07-29 DIAGNOSIS — R338 Other retention of urine: Secondary | ICD-10-CM | POA: Diagnosis not present

## 2020-07-29 DIAGNOSIS — N3289 Other specified disorders of bladder: Secondary | ICD-10-CM | POA: Diagnosis not present

## 2020-07-29 DIAGNOSIS — Z7901 Long term (current) use of anticoagulants: Secondary | ICD-10-CM | POA: Diagnosis not present

## 2020-07-29 LAB — URINE CULTURE: Culture: NO GROWTH

## 2020-07-29 LAB — HEMOGLOBIN AND HEMATOCRIT, BLOOD
HCT: 32.2 % — ABNORMAL LOW (ref 39.0–52.0)
Hemoglobin: 10.5 g/dL — ABNORMAL LOW (ref 13.0–17.0)

## 2020-07-29 MED ORDER — CLOPIDOGREL BISULFATE 75 MG PO TABS: 75.0000 mg | ORAL_TABLET | Freq: Every day | ORAL | 0 refills | Status: DC

## 2020-07-29 NOTE — Progress Notes (Signed)
Patient ID: Luis [name], male   DOB: 1940-12-23, 80 y.o.   MRN: [id number]    Subjective: Doing better this morning.  He had one episode requiring hand irrigation last night but no clots removed.  Objective: Vital signs in last 24 hours: Temp:  [98 F (36.7 C)-98.8 F (37.1 C)] 98.6 F (37 C) (02/17 0556) Pulse Rate:  [74-91] 74 (02/17 0556) Resp:  [16-20] 16 (02/17 0556) BP: (121-156)/(64-82) 121/70 (02/17 0556) SpO2:  [95 %-100 %] 95 % (02/17 0556) Weight:  [108.5 kg] 108.5 kg (02/16 2151)  Intake/Output from previous day: 02/16 0701 - 02/17 0700 In: 3000  Out: 8900 [Urine:8900] Intake/Output this shift: No intake/output data recorded.  Physical Exam:  General: Alert and oriented GU: Urine clear on minimal CBI  Lab Results: Recent Labs    07/28/20 0225 07/28/20 1828 07/29/20 0515  HGB 10.8* 11.3* 10.5*  HCT 33.4* 35.3* 32.2*   BMET Recent Labs    07/28/20 0225 07/28/20 1828  NA 136 132*  K 4.2 4.1  CL 101 100  CO2 25 20*  GLUCOSE 128* 117*  BUN 12 10  CREATININE 0.81 0.80  CALCIUM 8.7* 8.6*     Studies/Results: No results found.  Assessment/Plan: Hematuria: Will hold CBI this morning.  Dr. Louis Meckel to see later to determine plans for discharge.   LOS: 0 days   Luis [name] 07/29/2020, 7:24 AM

## 2020-07-29 NOTE — Discharge Summary (Signed)
Date of admission: 07/28/2020  Date of discharge: 07/29/2020  Admission diagnosis: gross hematuria/clot retention  Discharge diagnosis: same  Secondary diagnoses:  Patient Active Problem List   Diagnosis Date Noted  . Hematuria 07/28/2020  . Adenocarcinoma ([place])   . Smoker 02/04/2020  . Liver metastases ([place]) 08/26/2019  . Port-A-Cath in place 03/26/2019  . Goals of care, counseling/discussion 02/25/2019  . Carotid stenosis 01/07/2019  . Cecal cancer ([place]) 07/26/2017  . Adenocarcinoma of cecum ([place]) 06/21/2017  . Iron deficiency anemia due to chronic blood loss 06/21/2017  . DOE (dyspnea on exertion) 03/30/2015  . Obesity (BMI 30-39.9) 03/30/2015  . Trifascicular block   . Nonspecific chest pain 02/24/2015  . CAD (coronary artery disease) 08/13/2014  . Essential hypertension 08/13/2014  . Hyperlipidemia 08/13/2014  . Pulmonary fibrosis ([place]) 08/13/2014  . Sarcoidosis   . Syncope 01/29/2012    Procedures performed:   History and Physical: For full details, please see admission history and physical. Briefly, Luis [name] is a 80 y.o. year old patient with gross hematuria following a procedure at [facility].   Hospital Course: Patient was admitted by Dr. Alinda Money and a 97F 3-way hematuria catheter was placed.  His bladder was irrigated and clots removed.  He was placed on CBI overnight and his urine cleared.  With the CBI off the urine remained clear.  His hemoglobin was stable.  He is being discharged home with the catheter and will be scheduled for follow-up next week for catheter removal.  We will then schedule him for cystoscopy to complete his hematuria evaluation.   Laboratory values:  Recent Labs    07/28/20 0225 07/28/20 1828 07/29/20 0515  WBC 4.9  --   --   HGB 10.8* 11.3* 10.5*  HCT 33.4* 35.3* 32.2*   Recent Labs    07/28/20 0225 07/28/20 1828  NA 136 132*  K 4.2 4.1  CL 101 100  CO2 25 20*  GLUCOSE 128* 117*  BUN 12 10  CREATININE 0.81 0.80  CALCIUM  8.7* 8.6*   No results for input(s): LABPT, INR in the last 72 hours. No results for input(s): LABURIN in the last 72 hours. Results for orders placed or performed during the hospital encounter of 07/28/20  Urine C&S     Status: None   Collection Time: 07/28/20  7:19 AM   Specimen: Urine, Random  Result Value Ref Range Status   Specimen Description URINE, RANDOM  Final   Special Requests NONE  Final   Culture   Final    NO GROWTH Performed at [facility], [place] [address] [place], [place] [address]    Report Status 07/29/2020 FINAL  Final  SARS CORONAVIRUS 2 (TAT 6-24 HRS) Nasopharyngeal Nasopharyngeal Swab     Status: None   Collection Time: 07/28/20  2:51 PM   Specimen: Nasopharyngeal Swab  Result Value Ref Range Status   SARS Coronavirus 2 NEGATIVE NEGATIVE Final    Comment: (NOTE) SARS-CoV-2 target nucleic acids are NOT DETECTED.  The SARS-CoV-2 RNA is generally detectable in upper and lower respiratory specimens during the acute phase of infection. Negative results do not preclude SARS-CoV-2 infection, do not rule out co-infections with other pathogens, and should not be used as the sole basis for treatment or other patient management decisions. Negative results must be combined with clinical observations, patient history, and epidemiological information. The expected result is Negative.  Fact Sheet for Patients: SugarRoll.be  Fact Sheet for Healthcare Providers: https://www.woods-mathews.com/  This test is not  yet approved or cleared by the [place] and  has been authorized for detection and/or diagnosis of SARS-CoV-2 by FDA under an Emergency Use Authorization (EUA). This EUA will remain  in effect (meaning this test can be used) for the duration of the COVID-19 declaration under Se ction 564(b)(1) of the Act, 21 U.S.C. section 360bbb-3(b)(1), unless the authorization is terminated or revoked  sooner.  Performed at [facility], [place] [address], [place], [place] [address]     Disposition: Home  Discharge instruction: The patient was instructed to be ambulatory but told to refrain from heavy lifting, strenuous activity, or driving.   Discharge medications:  Allergies as of 07/29/2020   No Known Allergies     Medication List    TAKE these medications   amLODipine 5 MG tablet Commonly known as: NORVASC TAKE 1 TABLET BY MOUTH TWICE A DAY   aspirin EC 81 MG tablet Take 81 mg by mouth daily. Swallow whole.   Breo Ellipta 200-25 MCG/INH Aepb Generic drug: fluticasone furoate-vilanterol Inhale 1 puff into the lungs daily.   CANNABIDIOL PO Take 12 drops by mouth 2 (two) times a day. CBD OIL   clopidogrel 75 MG tablet Commonly known as: PLAVIX Take 1 tablet (75 mg total) by mouth daily. Hold until catheter is removed by urology next week What changed: additional instructions   furosemide 40 MG tablet Commonly known as: LASIX Take 1 tablet (40 mg total) by mouth daily.   metoprolol tartrate 25 MG tablet Commonly known as: LOPRESSOR Take 12.5 mg by mouth 2 (two) times daily.   MIRALAX PO Take 17 g by mouth daily as needed (constipation).   naproxen sodium 220 MG tablet Commonly known as: ALEVE Take 440 mg by mouth daily as needed (pain).   nitroGLYCERIN 0.4 MG SL tablet Commonly known as: NITROSTAT PLACE 1 TABLET UNDER THE TONGUE EVERY 5 MINUTES AS NEEDED FOR CHEST PAIN FOR 3 DOSES What changed: See the new instructions.   prochlorperazine 10 MG tablet Commonly known as: COMPAZINE Take 1 tablet (10 mg total) by mouth every 6 (six) hours as needed. What changed: reasons to take this   ranolazine 500 MG 12 hr tablet Commonly known as: RANEXA Take 1 tablet (500 mg total) by mouth 2 (two) times daily.   rosuvastatin 20 MG tablet Commonly known as: CRESTOR TAKE 1 TABLET BY MOUTH EVERY DAY   tamsulosin 0.4 MG Caps capsule Commonly known as:  FLOMAX Take 0.4 mg by mouth daily.   traMADol 50 MG tablet Commonly known as: ULTRAM Take 50-100 mg by mouth every 6 (six) hours as needed for moderate pain or severe pain.   Turmeric 500 MG Caps Take 500 mg by mouth daily.   Vitamin D3 50 MCG (2000 UT) capsule Take 2,000 Units by mouth every evening.       Followup:   Follow-up Information    [facility].   Specialty: Emergency Medicine Why: Return as needed for new or worsening symptoms Contact information: [address] [id number] [place] [address] [phone]       [name], MD.   Specialty: Family Medicine Contact information: [place] [address] [phone]        Go to Ardis Hughs, MD.   Specialty: Urology Why: We will contact you with an appointment. Contact information: [place] [address] [phone]

## 2020-07-29 NOTE — Progress Notes (Signed)
Paged Dr. Alinda Money and Urology regarding patient. Patient had 409 mls in bladder after bladder scanning and was really uncomfortable. Informed charge nurse. Patient had some relief and from pain med. Nothing was drained in bag, 4th floor hand irrigated, got some return and patient now has some relief.

## 2020-07-30 ENCOUNTER — Telehealth: Payer: Self-pay | Admitting: *Deleted

## 2020-07-30 ENCOUNTER — Inpatient Hospital Stay: Payer: Medicare PPO | Admitting: Oncology

## 2020-07-30 NOTE — Telephone Encounter (Signed)
Patient had called to cancel his appointment today via after hours line. He reports having his surgery on 07/23/20 with pump inserted for arterial chemotherapy. Returns to [place] on 2/21 to see Dr. Reynaldo Minium and Dr. Fayrene Helper. Has appointment w/urologist, Dr. Louis Meckel on 2/22 due to hematuria w/clots, which has caused him to need to have indwelling foley cath. Will f/u next week w/patient.

## 2020-08-01 DIAGNOSIS — R0982 Postnasal drip: Secondary | ICD-10-CM

## 2020-08-02 ENCOUNTER — Encounter: Payer: Self-pay | Admitting: Nurse Practitioner

## 2020-08-02 ENCOUNTER — Telehealth: Payer: Self-pay | Admitting: Oncology

## 2020-08-02 DIAGNOSIS — R339 Retention of urine, unspecified: Secondary | ICD-10-CM | POA: Diagnosis not present

## 2020-08-02 DIAGNOSIS — C189 Malignant neoplasm of colon, unspecified: Secondary | ICD-10-CM | POA: Diagnosis not present

## 2020-08-02 DIAGNOSIS — R600 Localized edema: Secondary | ICD-10-CM | POA: Diagnosis not present

## 2020-08-02 DIAGNOSIS — Z9049 Acquired absence of other specified parts of digestive tract: Secondary | ICD-10-CM | POA: Diagnosis not present

## 2020-08-02 DIAGNOSIS — C787 Secondary malignant neoplasm of liver and intrahepatic bile duct: Secondary | ICD-10-CM | POA: Diagnosis not present

## 2020-08-02 DIAGNOSIS — K5900 Constipation, unspecified: Secondary | ICD-10-CM | POA: Diagnosis not present

## 2020-08-02 DIAGNOSIS — R319 Hematuria, unspecified: Secondary | ICD-10-CM | POA: Diagnosis not present

## 2020-08-02 DIAGNOSIS — Z5111 Encounter for antineoplastic chemotherapy: Secondary | ICD-10-CM | POA: Diagnosis not present

## 2020-08-02 DIAGNOSIS — C182 Malignant neoplasm of ascending colon: Secondary | ICD-10-CM | POA: Diagnosis not present

## 2020-08-02 DIAGNOSIS — R300 Dysuria: Secondary | ICD-10-CM | POA: Diagnosis not present

## 2020-08-02 DIAGNOSIS — Z79899 Other long term (current) drug therapy: Secondary | ICD-10-CM | POA: Diagnosis not present

## 2020-08-02 MED ORDER — FLUTICASONE PROPIONATE 50 MCG/ACT NA SUSP: 1.0000 | Freq: Every day | NASAL | 2 refills | Status: DC

## 2020-08-02 MED ORDER — LORATADINE 10 MG PO TABS: 10.0000 mg | ORAL_TABLET | Freq: Every day | ORAL | 11 refills | Status: DC

## 2020-08-02 NOTE — Telephone Encounter (Signed)
Scheduled appt per 2/21 sch msg - no answer. Left message with appt date and time.

## 2020-08-02 NOTE — Telephone Encounter (Signed)
Spoke with patient. Describes nasal congestion, clear post nasal drip. Denies cough. Denies significant DOE, SOB. Left sided chest pain has occurred after recent placement of chemotherapy pump on left flank/abdomen below rib cage. Suspect post nasal drip - loratadine and Flonase sent to local pharmacy. Patient meeting with surgeon office who placed pump later today - most likely pain is post operative in my opinion given lack of respiratory issues.  If issues ongoing later  in week can pursue CXR - this was relayed to patient who is in agreement.

## 2020-08-02 NOTE — Telephone Encounter (Signed)
Received the following email from the pt:  This is part of a message that I wanted to send to my Pulmonologist, Location manager.  Since space is limited I'm sending the appropriate part to you.  Is there a way that I can send the complete document so that as a team you can consider appropriate steps?  I have some congestion in my head and chest. Because of all of the medications I am on I'm not sure what to take or who to call. I have a pain on the left side of my chest which makes it difficult to take a deep breath. When I try to take a deep breath it produces a sharp pain in the left side of my chest near the rib cage.   Somehow I feel that this is connected to the congestion. Last but least since coming home I am constantly cold.  To be comfortable I need to turn the thermostat up to a level that makes my wife uncomfortable.  I the following upcoming appointments.  I will send this message to the appropriate doctors so they are aware of what is going on. o        Mon, Feb 21 -        Beginning at 11:05 AM - [facility], Labs blood draw [facility], Amy [place] PA [facility], Trula Slade MD [facility], Oncology treatment o        Mon, Feb 28 -        Beginning at 12:55 PM    [facility], Labs blood draw [facility], Vickii Penna MD [place] - Dr. Larey Days MD  I called and spoke with the pt  He states having increased congestion in head and chest for about a week now  He coughs up some light green to grey sputum in the am's  He states has had chills but no fever  He has a sharp pain in the left side of his chest- constant and worsens with deep inspiration  He feels like his SOB has been worse as well  He states that he went to ED approx 2 months ago for the same discomfort and was told it was not cardiac related  He has ov on 08/09/20  Asking for sooner recs  Has been vaccinated against covid x 3  Please advise, thank you!

## 2020-08-04 ENCOUNTER — Telehealth: Payer: Self-pay | Admitting: *Deleted

## 2020-08-04 DIAGNOSIS — R310 Gross hematuria: Secondary | ICD-10-CM | POA: Diagnosis not present

## 2020-08-04 DIAGNOSIS — R338 Other retention of urine: Secondary | ICD-10-CM | POA: Diagnosis not present

## 2020-08-04 DIAGNOSIS — Z8546 Personal history of malignant neoplasm of prostate: Secondary | ICD-10-CM | POA: Diagnosis not present

## 2020-08-04 NOTE — Telephone Encounter (Signed)
Called Luis [name] to f/u on his status. Reports visit at [facility] on 2/21 and his hepatic infusion pump was started at 50 % strength. Returns on 08/09/20 for chemotherapy treatment. Saw urology on 2/22--put on antibiotic and foley was removed over the weekend. Still has occasional hematuria but no clots. Goes to pulmonary physician on 08/06/20. He is willing to see Dr. Benay Spice if he wishes to see him or a telephone visit. He is committed to [facility] on Mondays.

## 2020-08-06 ENCOUNTER — Encounter: Payer: Self-pay | Admitting: *Deleted

## 2020-08-06 DIAGNOSIS — S61216A Laceration without foreign body of right little finger without damage to nail, initial encounter: Secondary | ICD-10-CM | POA: Diagnosis not present

## 2020-08-06 DIAGNOSIS — Z6832 Body mass index (BMI) 32.0-32.9, adult: Secondary | ICD-10-CM | POA: Diagnosis not present

## 2020-08-06 NOTE — Progress Notes (Signed)
Per Dr. Benay Spice: Schedule for OV or video visit on 3/4 or 3/11. Scheduling message sent.

## 2020-08-09 ENCOUNTER — Other Ambulatory Visit: Payer: Self-pay

## 2020-08-09 ENCOUNTER — Encounter: Payer: Self-pay | Admitting: Pulmonary Disease

## 2020-08-09 ENCOUNTER — Ambulatory Visit: Payer: Medicare PPO | Admitting: Pulmonary Disease

## 2020-08-09 VITALS — BP 118/74 | HR 75 | Temp 97.9°F | Ht 71.0 in | Wt 230.6 lb

## 2020-08-09 DIAGNOSIS — R0600 Dyspnea, unspecified: Secondary | ICD-10-CM | POA: Diagnosis not present

## 2020-08-09 DIAGNOSIS — D869 Sarcoidosis, unspecified: Secondary | ICD-10-CM | POA: Diagnosis not present

## 2020-08-09 DIAGNOSIS — R059 Cough, unspecified: Secondary | ICD-10-CM | POA: Diagnosis not present

## 2020-08-09 DIAGNOSIS — R0609 Other forms of dyspnea: Secondary | ICD-10-CM

## 2020-08-09 NOTE — Progress Notes (Signed)
@Patient  ID: Luis [name], male    DOB: Oct 19, 1940, 80 y.o.   MRN: [id number]  Chief Complaint  Patient presents with  . Follow-up    3 month f/u for DOE. States he has been doing well since last visit. Still has some head congestion. Has not been able to use Flonase due to it not being covered by insurance. Denies any increased SOB.     Referring provider: [name], MD  HPI:   Luis [name] is a 80 year old man with past medical history significant for sarcoidosis treated for years of prednisone currently in remission, significant coronary artery disease status post multiple drug-eluting stents, currently on chemotherapy for adenocarcinoma of the cecum whom we are seeing in follow-up for dyspnea on exertion. Recent note x 2 from surgeon in [facility] system reviewed.  Most recent oncology note reviewed.   Doing ok. Dyspnea much better on Breo. Has had cough last few weeks. Associated with nasal congestion. Post nasal drip description. Started loratadine and Flonase last week. Not much better. Still with nasal congestion, phlegm in back of throat.   Recent surgery to place chemotherapy pump in left left flank.   HPI at initial visit: Overall, disorders are largely unchanged.  Formally followed with [name] for sarcoidosis.  This has been quiescent for some time.  Most recent CT scan reviewed which reveals on my interpretation stable bilateral fibrosis, scattered calcified granulomas in the lung as well as significant calcified hilar and mediastinal lymphadenopathy and sequela of prior lung surgery.  He has been seen in clinic recently by [name].  Primary complaint was dyspnea on exertion as well as cough.  These persist.  Chest imaging obtained as above.  PFTs obtained last week which reveal suggestion of mild to moderate restriction on spirometry without significant bronchodilator response.  Moderate restriction confirmed with total lung capacity 69% of predicted.  DLCO corrects to  within normal limits with adjustment for hemoglobin.  Cough and shortness of breath coincide with worsening nasal congestion.  Postnasal drip.  Not really using meds to address this this.  He is tolerating chemotherapy well.  Review of labs indicate stable hemoglobin without concern for symptomatic anemia at this time.   Questionaires / Pulmonary Flowsheets:   ACT:  No flowsheet data found.  MMRC: mMRC Dyspnea Scale mMRC Score  08/09/2020 0    Epworth:  No flowsheet data found.  Tests:   FENO:  No results found for: NITRICOXIDE  PFT: PFT Results Latest Ref Rng & Units 03/10/2020 08/30/2017  FVC-Pre L 2.94 3.14  FVC-Predicted Pre % 68 72  FVC-Post L 2.90 3.18  FVC-Predicted Post % 67 73  Pre FEV1/FVC % % 66 70  Post FEV1/FCV % % 68 73  FEV1-Pre L 1.93 2.19  FEV1-Predicted Pre % 62 69  FEV1-Post L 1.96 2.31  DLCO uncorrected ml/min/mmHg 19.10 17.73  DLCO UNC% % 75 52  DLCO corrected ml/min/mmHg 21.40 20.68  DLCO COR %Predicted % 84 61  DLVA Predicted % 118 98  TLC L 5.02 4.99  TLC % Predicted % 69 68  RV % Predicted % 76 71    WALK:  SIX MIN WALK 08/28/2017  Supplimental Oxygen during Test? (L/min) No  Tech Comments: pt walked a fast pace, tolerated walk well.     Imaging: Personally reviewed and as per EMR and discussion in this note  No results found.  Lab Results: Personally reviewed, eosinophils routinely 200-300 CBC    Component Value Date/Time  WBC 4.9 07/28/2020 0225   RBC 3.58 (L) 07/28/2020 0225   HGB 10.5 (L) 07/29/2020 0515   HGB 12.1 (L) 06/07/2020 1119   HGB 15.5 12/25/2018 1306   HCT 32.2 (L) 07/29/2020 0515   HCT 45.7 12/25/2018 1306   PLT 160 07/28/2020 0225   PLT 138 (L) 06/07/2020 1119   PLT 142 (L) 12/25/2018 1306   MCV 93.3 07/28/2020 0225   MCV 92 12/25/2018 1306   MCH 30.2 07/28/2020 0225   MCHC 32.3 07/28/2020 0225   RDW 15.4 07/28/2020 0225   RDW 14.4 12/25/2018 1306   LYMPHSABS 0.6 (L) 06/07/2020 1119   MONOABS 0.7  06/07/2020 1119   EOSABS 0.8 (H) 06/07/2020 1119   BASOSABS 0.0 06/07/2020 1119    BMET    Component Value Date/Time   NA 132 (L) 07/28/2020 1828   NA 138 12/25/2018 1306   K 4.1 07/28/2020 1828   CL 100 07/28/2020 1828   CO2 20 (L) 07/28/2020 1828   GLUCOSE 117 (H) 07/28/2020 1828   BUN 10 07/28/2020 1828   BUN 15 12/25/2018 1306   CREATININE 0.80 07/28/2020 1828   CREATININE 0.83 06/07/2020 1119   CREATININE 1.02 05/16/2016 0937   CALCIUM 8.6 (L) 07/28/2020 1828   GFRNONAA >60 07/28/2020 1828   GFRNONAA >60 06/07/2020 1119   GFRAA >60 03/10/2020 0855    BNP    Component Value Date/Time   BNP 43.4 05/16/2016 0937    ProBNP    Component Value Date/Time   PROBNP 60.0 08/10/2014 1052    Specialty Problems      Pulmonary Problems   Pulmonary fibrosis (HCC)   DOE (dyspnea on exertion)      No Known Allergies  Immunization History  Administered Date(s) Administered  . DTaP / IPV 09/25/2012  . Fluad Quad(high Dose 65+) 02/19/2019  . Influenza Split 03/27/2014  . Influenza, High Dose Seasonal PF 03/27/2014, 01/27/2017, 02/22/2018  . Influenza-Unspecified 02/24/2017, 03/10/2020  . PFIZER(Purple Top)SARS-COV-2 Vaccination 07/11/2019, 07/21/2019, 01/26/2020  . Pneumococcal Conjugate-13 03/27/2014  . Pneumococcal Polysaccharide-23 11/10/2009  . Pneumococcal-Unspecified 03/12/2014  . Tdap 03/24/2011, 09/25/2012  . Zoster 03/21/2010  . Zoster Recombinat (Shingrix) 09/01/2018, 02/19/2019    Past Medical History:  Diagnosis Date  . Adenocarcinoma of cecum ([place]) 06/21/2017  . Arthritis    "mid back; hands; knees" (02/24/2015)  . Basal cell carcinoma    left shoulder; mid chest; right eyelid (02/24/2015)  . CAD (coronary artery disease)    a. 08/2014 Cath/PCI: LM nl, LAD 30p, D1 95 (2.25x12 Resolute Integrity DES), LCX small, RI 100 (attempted PCI) - branches fill via L->L collats, RCA dominant, nl, RPDA/PLA nl, EF 60^. b. 02/24/2015 PCI CTO of Ramus DES x2.  .  Chronic bronchitis (HCC)    hx  . Diverticulosis 05/2005  . Elevated lipase   . Fuchs' corneal dystrophy   . History of adenomatous polyp of colon 05/2005   8 mm adenoma  . History of blood transfusion    "related to some of my surgeries"  . Hyperlipidemia   . Hypertension   . Iron deficiency anemia due to chronic blood loss 06/21/2017  . Pneumonia   . Prostate cancer ([place]) 2011   S/P seed implant  . Sarcoidosis   . Thrombocytopenia ([place])    a. Noted on prior labs, unclear of what w/u done.  . Trifascicular block     Tobacco History: Social History   Tobacco Use  Smoking Status Light Tobacco Smoker  . Packs/day: 0.00  .  Years: 59.00  . Pack years: 0.00  . Types: Cigars  Smokeless Tobacco Never Used  Tobacco Comment   02/24/2015 "quit cigarettes ~ 2000 ago but smokes cigars ionce/month"   Ready to quit: Not Answered Counseling given: Not Answered Comment: 02/24/2015 "quit cigarettes ~ 2000 ago but smokes cigars ionce/month"   Continue to not smoke  Outpatient Encounter Medications as of 08/09/2020  Medication Sig  . amLODipine (NORVASC) 5 MG tablet TAKE 1 TABLET BY MOUTH TWICE A DAY (Patient taking differently: Take 5 mg by mouth 2 (two) times daily.)  . CANNABIDIOL PO Take 12 drops by mouth 2 (two) times a day. CBD OIL  . Cholecalciferol (VITAMIN D3) 2000 units capsule Take 2,000 Units by mouth every evening.   . clopidogrel (PLAVIX) 75 MG tablet Take 1 tablet (75 mg total) by mouth daily. Hold until catheter is removed by urology next week  . fluticasone (FLONASE) 50 MCG/ACT nasal spray Place 1 spray into both nostrils daily.  . fluticasone furoate-vilanterol (BREO ELLIPTA) 200-25 MCG/INH AEPB Inhale 1 puff into the lungs daily.  . furosemide (LASIX) 40 MG tablet Take 1 tablet (40 mg total) by mouth daily.  [name] loratadine (CLARITIN) 10 MG tablet Take 1 tablet (10 mg total) by mouth daily.  . nitroGLYCERIN (NITROSTAT) 0.4 MG SL tablet PLACE 1 TABLET UNDER THE TONGUE EVERY  5 MINUTES AS NEEDED FOR CHEST PAIN FOR 3 DOSES (Patient taking differently: Place 0.4 mg under the tongue every 5 (five) minutes as needed for chest pain.)  . Polyethylene Glycol 3350 (MIRALAX PO) Take 17 g by mouth daily as needed (constipation).  . prochlorperazine (COMPAZINE) 10 MG tablet Take 1 tablet (10 mg total) by mouth every 6 (six) hours as needed. (Patient taking differently: Take 10 mg by mouth every 6 (six) hours as needed for vomiting or nausea.)  . ranolazine (RANEXA) 500 MG 12 hr tablet Take 1 tablet (500 mg total) by mouth 2 (two) times daily.  . rosuvastatin (CRESTOR) 20 MG tablet TAKE 1 TABLET BY MOUTH EVERY DAY (Patient taking differently: Take 20 mg by mouth daily.)  . tamsulosin (FLOMAX) 0.4 MG CAPS capsule Take 0.4 mg by mouth daily.  . traMADol (ULTRAM) 50 MG tablet Take 50-100 mg by mouth every 6 (six) hours as needed for moderate pain or severe pain.  . Turmeric 500 MG CAPS Take 500 mg by mouth daily.  . [DISCONTINUED] aspirin EC 81 MG tablet Take 81 mg by mouth daily. Swallow whole.  . [DISCONTINUED] metoprolol tartrate (LOPRESSOR) 25 MG tablet Take 12.5 mg by mouth 2 (two) times daily.  . [DISCONTINUED] naproxen sodium (ALEVE) 220 MG tablet Take 440 mg by mouth daily as needed (pain).   No facility-administered encounter medications on file as of 08/09/2020.     Review of Systems  N/a  Physical Exam  BP 118/74   Pulse 75   Temp 97.9 F (36.6 C) (Temporal)   Ht 5\' 11"  (1.803 m)   Wt 230 lb 9.6 oz (104.6 kg)   SpO2 98% Comment: on RA  BMI 32.16 kg/m   Wt Readings from Last 5 Encounters:  08/09/20 230 lb 9.6 oz (104.6 kg)  07/28/20 239 lb 3.2 oz (108.5 kg)  07/02/20 243 lb 11.2 oz (110.5 kg)  06/24/20 240 lb (108.9 kg)  06/16/20 246 lb (111.6 kg)    BMI Readings from Last 5 Encounters:  08/09/20 32.16 kg/m  07/28/20 33.36 kg/m  07/02/20 33.99 kg/m  06/24/20 33.47 kg/m  06/16/20 35.30 kg/m  Physical Exam General: Well-appearing, no  acute distress Eyes: EOMI, no icterus Neck: No JVD supple Respiratory:L clear to auscultation bilaterally, no wheezes or crackles Cardiovascular: Regular rhythm, no murmurs Abdomen: Nondistended, bowel sounds present Psych: Normal mood, full affect   Assessment & Plan:   Dyspnea on exertion: Imaging and PFTs reviewed.  Suspect is multifactorial.  PFTs on my interpretation reveal moderate restriction likely sequela of burned-out sarcoid in addition to prior lung resection when this was diagnosed many years ago.  No evidence of active pulmonary sarcoid on most recent imaging.  No evidence of any pneumonitis from chemotherapy regimen.  He endorses nasal congestion that coincides with worsening dyspnea.  Raises high suspicion for asthma.  Notably, he has had eosinophil levels as high as 300 as well. Empirically treated with mid dose Breo and DOE has improved.   Asthma: Presumed diagnosis given atopic symptoms and dyspnea on exertion. Improving.  High dose Breo.  If not adequately improved in the coming weeks anticipate adding montelukast.    Cough: related to nasal congestion, inflamed nasal passages on exam. Continue flonase. Add montelukast if not improving in coming weeks.   Return in about 3 months (around 11/06/2020).   Lanier Clam, MD 08/09/2020

## 2020-08-09 NOTE — Patient Instructions (Signed)
Let me know how the cough is next week   We can try singulair if not getting better  Come back in 3 months

## 2020-08-16 DIAGNOSIS — C189 Malignant neoplasm of colon, unspecified: Secondary | ICD-10-CM | POA: Diagnosis not present

## 2020-08-16 DIAGNOSIS — R319 Hematuria, unspecified: Secondary | ICD-10-CM | POA: Diagnosis not present

## 2020-08-16 DIAGNOSIS — Z5111 Encounter for antineoplastic chemotherapy: Secondary | ICD-10-CM | POA: Diagnosis not present

## 2020-08-16 DIAGNOSIS — Z79899 Other long term (current) drug therapy: Secondary | ICD-10-CM | POA: Diagnosis not present

## 2020-08-16 DIAGNOSIS — C787 Secondary malignant neoplasm of liver and intrahepatic bile duct: Secondary | ICD-10-CM | POA: Diagnosis not present

## 2020-08-16 DIAGNOSIS — R600 Localized edema: Secondary | ICD-10-CM | POA: Diagnosis not present

## 2020-08-16 DIAGNOSIS — Z5112 Encounter for antineoplastic immunotherapy: Secondary | ICD-10-CM | POA: Diagnosis not present

## 2020-08-16 DIAGNOSIS — C182 Malignant neoplasm of ascending colon: Secondary | ICD-10-CM | POA: Diagnosis not present

## 2020-08-16 DIAGNOSIS — R300 Dysuria: Secondary | ICD-10-CM | POA: Diagnosis not present

## 2020-08-17 ENCOUNTER — Other Ambulatory Visit: Payer: Self-pay | Admitting: Interventional Cardiology

## 2020-08-18 DIAGNOSIS — C787 Secondary malignant neoplasm of liver and intrahepatic bile duct: Secondary | ICD-10-CM | POA: Diagnosis not present

## 2020-08-18 DIAGNOSIS — C189 Malignant neoplasm of colon, unspecified: Secondary | ICD-10-CM | POA: Diagnosis not present

## 2020-08-20 ENCOUNTER — Inpatient Hospital Stay: Payer: Medicare PPO | Attending: Nurse Practitioner | Admitting: Oncology

## 2020-08-20 ENCOUNTER — Other Ambulatory Visit: Payer: Self-pay

## 2020-08-20 VITALS — BP 115/73 | HR 73 | Temp 98.9°F | Resp 17 | Ht 71.0 in | Wt 239.5 lb

## 2020-08-20 DIAGNOSIS — C180 Malignant neoplasm of cecum: Secondary | ICD-10-CM | POA: Diagnosis not present

## 2020-08-20 DIAGNOSIS — G620 Drug-induced polyneuropathy: Secondary | ICD-10-CM | POA: Diagnosis not present

## 2020-08-20 DIAGNOSIS — C787 Secondary malignant neoplasm of liver and intrahepatic bile duct: Secondary | ICD-10-CM | POA: Diagnosis not present

## 2020-08-20 NOTE — Progress Notes (Signed)
[place] OFFICE PROGRESS NOTE   Diagnosis: Colon cancer  INTERVAL HISTORY:   Luis [name] has been evaluated by Dr. Doree Fudge and Reynaldo Minium at [facility].  He underwent placement of a hepatic infusion pump on 07/23/2020.  Cycle 1 FUDR at 50% dose 08/02/2020.  He began FOLFIRI/panitumumab 08/16/2020.  He developed gross hematuria and clot retention after he returned home from the hepatic infusion pump placement.  A Foley catheter was placed and he was admitted 07/28/2020 for bladder irrigation.  He was discharged on 07/29/2020.  Luis [name] reports feeling well.  Good appetite.  He has returned to work.  No nausea/vomiting or diarrhea following chemotherapy this week.  He feels the skin rash is coming back.  Leg edema improved with furosemide.  Objective:  Vital signs in last 24 hours:  Blood pressure 115/73, pulse 73, temperature 98.9 F (37.2 C), temperature source Tympanic, resp. rate 17, height $RemoveBe'5\' 11"'tPrqGIDpN$  (1.803 m), weight 239 lb 8 oz (108.6 kg), SpO2 99 %.    Resp: Lungs clear bilaterally Cardio: Regular rate and rhythm GI: No hepatomegaly, nontender, left abdominal hepatic infusion pump with a healed incision Vascular: Trace pitting edema of the right greater than left lower leg  Skin: Mild acne type rash at the anterior chest and upper back  Portacath/PICC-without erythema  Lab Results:  Lab Results  Component Value Date   WBC 4.9 07/28/2020   HGB 10.5 (L) 07/29/2020   HCT 32.2 (L) 07/29/2020   MCV 93.3 07/28/2020   PLT 160 07/28/2020   NEUTROABS 4.0 06/07/2020    CMP  Lab Results  Component Value Date   NA 132 (L) 07/28/2020   K 4.1 07/28/2020   CL 100 07/28/2020   CO2 20 (L) 07/28/2020   GLUCOSE 117 (H) 07/28/2020   BUN 10 07/28/2020   CREATININE 0.80 07/28/2020   CALCIUM 8.6 (L) 07/28/2020   PROT 6.3 (L) 06/07/2020   ALBUMIN 3.7 06/07/2020   AST 43 (H) 06/07/2020   ALT 38 06/07/2020   ALKPHOS 68 06/07/2020   BILITOT 0.5 06/07/2020   GFRNONAA >60  07/28/2020   GFRAA >60 03/10/2020     Medications: I have reviewed the patient's current medications.   Assessment/Plan: 1. Adenocarcinoma of the cecum, stage IIA(T3N0), status post a right colectomy 07/26/2017 ? 0/16 lymph nodes positive, no lymphovascular invasion, perineural invasion present ? MSI-stable, no loss of mismatch repair protein expression ? Surveillance colonoscopy 07/26/2018-no polyps found ? Elevated CEA June 2020, persistently elevated August 2020 ? CTs 02/07/2019-bilateral liver metastases, no extrahepatic metastatic disease, changes of sarcoidosis in the chest ? Biopsy liver lesion 03/04/2019-metastatic adenocarcinoma, with morphology consistent with metastatic colonic adenocarcinoma, RAS WT ? Cycle 1 FOLFOX 03/12/2019 ? Cycle 2 FOLFOX 03/26/2019, oxaliplatin dose reduced secondary to thrombocytopenia ? Cycle 3 FOLFOX 04/09/2019, oxaliplatin further dose reduced and aspirin held secondary to thrombocytopenia ? Cycle 4 FOLFOX 04/23/2019 ? Cycle 5 FOLFOX 05/07/2019 ? CTs 05/23/2019-decrease in liver lesions. No new or progressive findings. ? Cycle 6 FOLFOX 05/27/2019 ? Cycle 7 FOLFOX 06/14/2019 ? Cycle 8 FOLFOX 07/02/2019-Udenyca held ? Cycle 9 FOLFOX 07/16/2019-Udenyca held ? Cycle 10 FOLFOX 07/30/2019-Udenyca held ? CT abdomen/pelvis 08/13/2019-mild improvement in previously noted liver lesions ? MRI abdomen 08/13/2019-3 liver lesions ? Cycle 1 FOLFIRI/Avastin 09/17/2019 ? Cycle 2 FOLFIRI/Avastin 10/01/2019 ? Cycle 3 FOLFIRI/Avastin 10/15/2019 ? Cycle 4 FOLFIRI/Avastin 11/05/2019 ? Cycle 5 FOLFIRI/Avastin 11/19/2019 ? CTs 11/27/2019-stable liver lesions; no new liver lesions. No other evidence of metastatic disease in the abdomen or pelvis. ? Cycle  1 FOLFIRI/Panitumumab 12/03/2019 ? Cycle 2 FOLFIRI/Panitumumab 12/17/2019 (Panitumumab dose reduced due to rash) ? Cycle 3 FOLFIRI/Panitumumab 01/01/2020 ? Cycle 4 FOLFIRI/Panitumumab 01/14/2020 ? Cycle 5 FOLFIRI/Panitumumab  01/28/2020 ? CT abdomen/pelvis 02/06/2020-decreased size of right hepatic lesions, subtle lesion in the lateral left hepatic lobe-not clearly identified, noevidence of disease progression ? Cycle 6 FOLFIRI/Panitumumab 02/11/2020 ? Cycle 7 FOLFIRI/Panitumumab 03/10/2020 ? Cycle 8 FOLFIRI/Panitumumab 03/26/2020 ? Cycle 9 FOLFIRI/Panitumumab 04/14/2020 ? CT abdomen/pelvis 04/26/2020-mild enlargement of 2 liver lesions on initial report, repeat review with radiology consistent with stable disease ? Cycle 10 FOLFIRI/Panitumumab 04/29/2020 ? MRI liver 05/19/2020-right hepatic lobe lesions diminished in size when compared to the study of March 2021.  Lesion in hepatic subsegment VI with very limited assessment as well due to adjacent bowel. ? 05/20/2020 referred to interventional radiology to consider ablation of liver lesions ? CT abdomen 06/30/2020 prior to planned ablation procedure, enlargement of segment 7 liver lesion and segment 6 liver lesion.  Interval recurrence of segment 2 lesion measuring 4.5 cm, ablation aborted ? 07/23/2020-diagnostic laparoscopy, robotic assisted insertion of hepatic infusion pump, portal lymphadenectomy-granulomatous inflammation compatible with sarcoidosis, core tumor biopsy segment 6, infiltrating adenocarcinoma ? 08/02/2020-cycle 1 FUDR at 50% dose ? 08/16/2020-FOLFIRI/panitumumab 2. History of colon polyps 3. Microcytic anemia January 2019 4. Sarcoidosis 5. Coronary artery disease 6. Port-A-Cath placement, interventional radiology, 03/04/2019 7. Thrombocytopenia, mild thrombocytopenia predating chemotherapy 8. Oxaliplatin neuropathy 9. Dyspnea on exertion-CT chest 02/25/2020 negative for PE, infection, pneumonitis; scheduled for PFTs 03/10/2020, followed by pulmonary      Disposition: Luis [name] has metastatic colon cancer.  He underwent placement of a hepatic arterial infusion pump by Dr. Fayrene Helper on 07/23/2020.  He resumed FOLFIRI/panitumumab on 08/16/2020.  Luis [name]  appears well.  He is scheduled for close clinical follow-up with Dr. Reynaldo Minium.  I am available to see him as needed.  He will return for an office visit here after restaging CTs in May.  Betsy Coder, MD  08/20/2020  2:19 PM

## 2020-08-23 ENCOUNTER — Telehealth: Payer: Self-pay | Admitting: Oncology

## 2020-08-23 NOTE — Telephone Encounter (Signed)
Scheduled appointments per 3/11 los. Spoke to patient who is aware of appointment date and time.

## 2020-08-30 DIAGNOSIS — Z5111 Encounter for antineoplastic chemotherapy: Secondary | ICD-10-CM | POA: Diagnosis not present

## 2020-08-30 DIAGNOSIS — C787 Secondary malignant neoplasm of liver and intrahepatic bile duct: Secondary | ICD-10-CM | POA: Diagnosis not present

## 2020-08-30 DIAGNOSIS — R21 Rash and other nonspecific skin eruption: Secondary | ICD-10-CM | POA: Diagnosis not present

## 2020-08-30 DIAGNOSIS — R300 Dysuria: Secondary | ICD-10-CM | POA: Diagnosis not present

## 2020-08-30 DIAGNOSIS — C182 Malignant neoplasm of ascending colon: Secondary | ICD-10-CM | POA: Diagnosis not present

## 2020-08-30 DIAGNOSIS — Z5112 Encounter for antineoplastic immunotherapy: Secondary | ICD-10-CM | POA: Diagnosis not present

## 2020-08-30 DIAGNOSIS — R319 Hematuria, unspecified: Secondary | ICD-10-CM | POA: Diagnosis not present

## 2020-08-30 DIAGNOSIS — Z79899 Other long term (current) drug therapy: Secondary | ICD-10-CM | POA: Diagnosis not present

## 2020-08-30 DIAGNOSIS — C189 Malignant neoplasm of colon, unspecified: Secondary | ICD-10-CM | POA: Diagnosis not present

## 2020-08-30 DIAGNOSIS — R600 Localized edema: Secondary | ICD-10-CM | POA: Diagnosis not present

## 2020-09-01 DIAGNOSIS — C787 Secondary malignant neoplasm of liver and intrahepatic bile duct: Secondary | ICD-10-CM | POA: Diagnosis not present

## 2020-09-01 DIAGNOSIS — C189 Malignant neoplasm of colon, unspecified: Secondary | ICD-10-CM | POA: Diagnosis not present

## 2020-09-06 ENCOUNTER — Telehealth: Payer: Self-pay | Admitting: Oncology

## 2020-09-06 NOTE — Telephone Encounter (Signed)
Calendar & letter has been mailed to the patient with updated address for appointments at [facility]  

## 2020-09-09 DIAGNOSIS — Z8546 Personal history of malignant neoplasm of prostate: Secondary | ICD-10-CM | POA: Diagnosis not present

## 2020-09-09 DIAGNOSIS — R310 Gross hematuria: Secondary | ICD-10-CM | POA: Diagnosis not present

## 2020-09-13 DIAGNOSIS — R21 Rash and other nonspecific skin eruption: Secondary | ICD-10-CM | POA: Diagnosis not present

## 2020-09-13 DIAGNOSIS — Z9049 Acquired absence of other specified parts of digestive tract: Secondary | ICD-10-CM | POA: Diagnosis not present

## 2020-09-13 DIAGNOSIS — C180 Malignant neoplasm of cecum: Secondary | ICD-10-CM | POA: Diagnosis not present

## 2020-09-13 DIAGNOSIS — Z5112 Encounter for antineoplastic immunotherapy: Secondary | ICD-10-CM | POA: Diagnosis not present

## 2020-09-13 DIAGNOSIS — C787 Secondary malignant neoplasm of liver and intrahepatic bile duct: Secondary | ICD-10-CM | POA: Diagnosis not present

## 2020-09-13 DIAGNOSIS — R300 Dysuria: Secondary | ICD-10-CM | POA: Diagnosis not present

## 2020-09-13 DIAGNOSIS — Z5111 Encounter for antineoplastic chemotherapy: Secondary | ICD-10-CM | POA: Diagnosis not present

## 2020-09-13 DIAGNOSIS — C189 Malignant neoplasm of colon, unspecified: Secondary | ICD-10-CM | POA: Diagnosis not present

## 2020-09-13 DIAGNOSIS — R600 Localized edema: Secondary | ICD-10-CM | POA: Diagnosis not present

## 2020-09-13 DIAGNOSIS — R319 Hematuria, unspecified: Secondary | ICD-10-CM | POA: Diagnosis not present

## 2020-09-15 DIAGNOSIS — C189 Malignant neoplasm of colon, unspecified: Secondary | ICD-10-CM | POA: Diagnosis not present

## 2020-09-15 DIAGNOSIS — C787 Secondary malignant neoplasm of liver and intrahepatic bile duct: Secondary | ICD-10-CM | POA: Diagnosis not present

## 2020-09-27 ENCOUNTER — Telehealth: Payer: Self-pay | Admitting: *Deleted

## 2020-09-27 DIAGNOSIS — L988 Other specified disorders of the skin and subcutaneous tissue: Secondary | ICD-10-CM | POA: Diagnosis not present

## 2020-09-27 DIAGNOSIS — Z5111 Encounter for antineoplastic chemotherapy: Secondary | ICD-10-CM | POA: Diagnosis not present

## 2020-09-27 DIAGNOSIS — C787 Secondary malignant neoplasm of liver and intrahepatic bile duct: Secondary | ICD-10-CM | POA: Diagnosis not present

## 2020-09-27 DIAGNOSIS — Z5112 Encounter for antineoplastic immunotherapy: Secondary | ICD-10-CM | POA: Diagnosis not present

## 2020-09-27 DIAGNOSIS — R5383 Other fatigue: Secondary | ICD-10-CM | POA: Diagnosis not present

## 2020-09-27 DIAGNOSIS — R21 Rash and other nonspecific skin eruption: Secondary | ICD-10-CM | POA: Diagnosis not present

## 2020-09-27 DIAGNOSIS — C189 Malignant neoplasm of colon, unspecified: Secondary | ICD-10-CM | POA: Diagnosis not present

## 2020-09-27 DIAGNOSIS — C180 Malignant neoplasm of cecum: Secondary | ICD-10-CM | POA: Diagnosis not present

## 2020-09-27 MED ORDER — PREDNISONE 20 MG PO TABS: 40.0000 mg | ORAL_TABLET | Freq: Every day | ORAL | 0 refills | Status: AC

## 2020-09-27 MED ORDER — AMOXICILLIN-POT CLAVULANATE 875-125 MG PO TABS: 1.0000 | ORAL_TABLET | Freq: Two times a day (BID) | ORAL | 0 refills | Status: AC

## 2020-09-27 NOTE — Telephone Encounter (Signed)
Dr. Silas Flood, please see mychart message sent by pt and advise:  To: [facility] POOL    From: Sherlyn Hay "Chuck"    Created: 09/26/2020 10:10 PM     *-*-*This message was handled on 09/27/2020 9:44 AM by Atiya Yera P*-*-*  I have got  what seems to be a congestion in my bronchial area.  I have a cough, it makes my throat sore,  and it brings up a dark green mucus.  I do not have fever, aches, pains or any other symptoms.  It makes the shortness of breath worse.  It feels much like what I usually get each winter.  Do you want me to come in earlier than our appointment on 4/28, or do you want to call something in?  In past years what would be prescribed for this would be an antibiotic and prednisone for one week.  Let me know.  I am at [facility] all day Monday.

## 2020-09-27 NOTE — Telephone Encounter (Signed)
Patient called to report he has CT scan and sees Dr. Fayrene Helper at [facility] on 5/16 and would like to move his 5/13 visit here to 11/01/20. He is going to tell Dr. Fayrene Helper that he prefers the liver ablation to resection and wants Dr. Benay Spice aware as well. Dr. Benay Spice notified. Scheduling message sent for requested change.

## 2020-09-27 NOTE — Telephone Encounter (Signed)
Prednisone 40 mg daily x 5 days and Augmentin BID x 7 days. Likely asthma flare. Abx in case coinciding infection.

## 2020-09-29 DIAGNOSIS — C787 Secondary malignant neoplasm of liver and intrahepatic bile duct: Secondary | ICD-10-CM | POA: Diagnosis not present

## 2020-09-29 DIAGNOSIS — C189 Malignant neoplasm of colon, unspecified: Secondary | ICD-10-CM | POA: Diagnosis not present

## 2020-10-07 ENCOUNTER — Encounter: Payer: Self-pay | Admitting: Pulmonary Disease

## 2020-10-07 ENCOUNTER — Other Ambulatory Visit: Payer: Self-pay

## 2020-10-07 ENCOUNTER — Ambulatory Visit: Payer: Medicare PPO | Admitting: Pulmonary Disease

## 2020-10-07 VITALS — BP 130/72 | HR 77 | Temp 97.5°F | Ht 70.5 in | Wt 239.2 lb

## 2020-10-07 DIAGNOSIS — R0600 Dyspnea, unspecified: Secondary | ICD-10-CM | POA: Diagnosis not present

## 2020-10-07 DIAGNOSIS — R0609 Other forms of dyspnea: Secondary | ICD-10-CM

## 2020-10-07 DIAGNOSIS — J4520 Mild intermittent asthma, uncomplicated: Secondary | ICD-10-CM

## 2020-10-07 NOTE — Patient Instructions (Addendum)
Nice to see you again  Increase Claritin to 1 pill twice a day  Continue the Breo ok to go to the lower dose  Ok to keep off the nasal spray  Follow up with Dr. Silas Flood in 6 months

## 2020-10-11 DIAGNOSIS — G629 Polyneuropathy, unspecified: Secondary | ICD-10-CM | POA: Diagnosis not present

## 2020-10-11 DIAGNOSIS — Z5112 Encounter for antineoplastic immunotherapy: Secondary | ICD-10-CM | POA: Diagnosis not present

## 2020-10-11 DIAGNOSIS — C787 Secondary malignant neoplasm of liver and intrahepatic bile duct: Secondary | ICD-10-CM | POA: Diagnosis not present

## 2020-10-11 DIAGNOSIS — C180 Malignant neoplasm of cecum: Secondary | ICD-10-CM | POA: Diagnosis not present

## 2020-10-11 DIAGNOSIS — R5383 Other fatigue: Secondary | ICD-10-CM | POA: Diagnosis not present

## 2020-10-11 DIAGNOSIS — R140 Abdominal distension (gaseous): Secondary | ICD-10-CM | POA: Diagnosis not present

## 2020-10-11 DIAGNOSIS — R21 Rash and other nonspecific skin eruption: Secondary | ICD-10-CM | POA: Diagnosis not present

## 2020-10-11 DIAGNOSIS — Z5111 Encounter for antineoplastic chemotherapy: Secondary | ICD-10-CM | POA: Diagnosis not present

## 2020-10-11 DIAGNOSIS — C189 Malignant neoplasm of colon, unspecified: Secondary | ICD-10-CM | POA: Diagnosis not present

## 2020-10-22 ENCOUNTER — Ambulatory Visit: Payer: Medicare PPO | Admitting: Nurse Practitioner

## 2020-10-25 DIAGNOSIS — Z5111 Encounter for antineoplastic chemotherapy: Secondary | ICD-10-CM | POA: Diagnosis not present

## 2020-10-25 DIAGNOSIS — Z01818 Encounter for other preprocedural examination: Secondary | ICD-10-CM | POA: Diagnosis not present

## 2020-10-25 DIAGNOSIS — R5383 Other fatigue: Secondary | ICD-10-CM | POA: Diagnosis not present

## 2020-10-25 DIAGNOSIS — C189 Malignant neoplasm of colon, unspecified: Secondary | ICD-10-CM | POA: Diagnosis not present

## 2020-10-25 DIAGNOSIS — C180 Malignant neoplasm of cecum: Secondary | ICD-10-CM | POA: Diagnosis not present

## 2020-10-25 DIAGNOSIS — C787 Secondary malignant neoplasm of liver and intrahepatic bile duct: Secondary | ICD-10-CM | POA: Diagnosis not present

## 2020-10-25 DIAGNOSIS — Z5112 Encounter for antineoplastic immunotherapy: Secondary | ICD-10-CM | POA: Diagnosis not present

## 2020-10-25 DIAGNOSIS — R21 Rash and other nonspecific skin eruption: Secondary | ICD-10-CM | POA: Diagnosis not present

## 2020-10-25 DIAGNOSIS — I6529 Occlusion and stenosis of unspecified carotid artery: Secondary | ICD-10-CM | POA: Diagnosis not present

## 2020-10-25 DIAGNOSIS — I10 Essential (primary) hypertension: Secondary | ICD-10-CM | POA: Diagnosis not present

## 2020-10-25 DIAGNOSIS — D500 Iron deficiency anemia secondary to blood loss (chronic): Secondary | ICD-10-CM | POA: Diagnosis not present

## 2020-10-25 DIAGNOSIS — I2510 Atherosclerotic heart disease of native coronary artery without angina pectoris: Secondary | ICD-10-CM | POA: Diagnosis not present

## 2020-10-25 DIAGNOSIS — C182 Malignant neoplasm of ascending colon: Secondary | ICD-10-CM | POA: Diagnosis not present

## 2020-11-01 ENCOUNTER — Other Ambulatory Visit: Payer: Self-pay

## 2020-11-01 ENCOUNTER — Encounter: Payer: Self-pay | Admitting: Nurse Practitioner

## 2020-11-01 ENCOUNTER — Inpatient Hospital Stay: Payer: Medicare PPO | Attending: Nurse Practitioner | Admitting: Nurse Practitioner

## 2020-11-01 ENCOUNTER — Ambulatory Visit
Admission: RE | Admit: 2020-11-01 | Discharge: 2020-11-01 | Disposition: A | Discharge status: Home / Self Care | Payer: Self-pay | Source: Ambulatory Visit | Attending: Nurse Practitioner | Admitting: Nurse Practitioner

## 2020-11-01 VITALS — BP 158/76 | HR 84 | Temp 98.2°F | Resp 19 | Ht 70.0 in | Wt 238.2 lb

## 2020-11-01 DIAGNOSIS — G620 Drug-induced polyneuropathy: Secondary | ICD-10-CM | POA: Insufficient documentation

## 2020-11-01 DIAGNOSIS — Z79899 Other long term (current) drug therapy: Secondary | ICD-10-CM | POA: Insufficient documentation

## 2020-11-01 DIAGNOSIS — Z86010 Personal history of colonic polyps: Secondary | ICD-10-CM | POA: Insufficient documentation

## 2020-11-01 DIAGNOSIS — C180 Malignant neoplasm of cecum: Secondary | ICD-10-CM

## 2020-11-01 DIAGNOSIS — C787 Secondary malignant neoplasm of liver and intrahepatic bile duct: Secondary | ICD-10-CM | POA: Insufficient documentation

## 2020-11-01 DIAGNOSIS — R197 Diarrhea, unspecified: Secondary | ICD-10-CM | POA: Insufficient documentation

## 2020-11-01 DIAGNOSIS — I2510 Atherosclerotic heart disease of native coronary artery without angina pectoris: Secondary | ICD-10-CM | POA: Insufficient documentation

## 2020-11-01 DIAGNOSIS — T451X5A Adverse effect of antineoplastic and immunosuppressive drugs, initial encounter: Secondary | ICD-10-CM | POA: Diagnosis not present

## 2020-11-01 DIAGNOSIS — D869 Sarcoidosis, unspecified: Secondary | ICD-10-CM | POA: Diagnosis not present

## 2020-11-01 DIAGNOSIS — D696 Thrombocytopenia, unspecified: Secondary | ICD-10-CM | POA: Diagnosis not present

## 2020-11-01 DIAGNOSIS — Z9049 Acquired absence of other specified parts of digestive tract: Secondary | ICD-10-CM | POA: Diagnosis not present

## 2020-11-01 NOTE — Progress Notes (Signed)
CT results from [facility] to chart  Order placed  Request placed with [facility]

## 2020-11-01 NOTE — Progress Notes (Addendum)
Luis [name]   Diagnosis: Colon cancer  INTERVAL HISTORY:   Luis [name] returns for follow-up.  He continues FOLFIRI/Panitumumab and FUDR at [facility].  He feels he overall is tolerating treatment well.  No nausea or vomiting.  He has periodic diarrhea.  Skin rash is better since beginning a topical cream.  Objective:  Vital signs in last 24 hours:  Blood pressure (!) 158/76, pulse 84, temperature 98.2 F (36.8 C), temperature source Oral, resp. rate 19, height _0  (1.778 m), weight 238 lb 3.2 oz (108 kg), SpO2 97 %.    HEENT: No thrush or ulcers. Resp: Lungs clear bilaterally. Cardio: Regular rate and rhythm. GI: No hepatomegaly.  Hepatic infusion pump left abdomen. Vascular: No leg edema. Skin: Skin in general has a dry appearance.  Multiple linear breaks in skin at the fingertips. Port-A-Cath without erythema.  Lab Results:  Lab Results  Component Value Date   WBC 4.9 07/28/2020   HGB 10.5 (L) 07/29/2020   HCT 32.2 (L) 07/29/2020   MCV 93.3 07/28/2020   PLT 160 07/28/2020   NEUTROABS 4.0 06/07/2020    Imaging:  No results found.  Medications: I have reviewed the patient's current medications.  Assessment/Plan: 1. Adenocarcinoma of the cecum, stage IIA(T3N0), status post a right colectomy 07/26/2017 ? 0/16 lymph nodes positive, no lymphovascular invasion, perineural invasion present ? MSI-stable, no loss of mismatch repair protein expression ? Surveillance colonoscopy 07/26/2018-no polyps found ? Elevated CEA June 2020, persistently elevated August 2020 ? CTs 02/07/2019-bilateral liver metastases, no extrahepatic metastatic disease, changes of sarcoidosis in the chest ? Biopsy liver lesion 03/04/2019-metastatic adenocarcinoma, with morphology consistent with metastatic colonic adenocarcinoma, RAS WT ? Cycle 1 FOLFOX 03/12/2019 ? Cycle 2 FOLFOX 03/26/2019, oxaliplatin dose reduced secondary to thrombocytopenia ? Cycle 3 FOLFOX  04/09/2019, oxaliplatin further dose reduced and aspirin held secondary to thrombocytopenia ? Cycle 4 FOLFOX 04/23/2019 ? Cycle 5 FOLFOX 05/07/2019 ? CTs 05/23/2019-decrease in liver lesions. No new or progressive findings. ? Cycle 6 FOLFOX 05/27/2019 ? Cycle 7 FOLFOX 06/14/2019 ? Cycle 8 FOLFOX 07/02/2019-Udenyca held ? Cycle 9 FOLFOX 07/16/2019-Udenyca held ? Cycle 10 FOLFOX 07/30/2019-Udenyca held ? CT abdomen/pelvis 08/13/2019-mild improvement in previously noted liver lesions ? MRI abdomen 08/13/2019-3 liver lesions ? Cycle 1 FOLFIRI/Avastin 09/17/2019 ? Cycle 2 FOLFIRI/Avastin 10/01/2019 ? Cycle 3 FOLFIRI/Avastin 10/15/2019 ? Cycle 4 FOLFIRI/Avastin 11/05/2019 ? Cycle 5 FOLFIRI/Avastin 11/19/2019 ? CTs 11/27/2019-stable liver lesions; no new liver lesions. No other evidence of metastatic disease in the abdomen or pelvis. ? Cycle 1 FOLFIRI/Panitumumab 12/03/2019 ? Cycle 2 FOLFIRI/Panitumumab 12/17/2019 (Panitumumab dose reduced due to rash) ? Cycle 3 FOLFIRI/Panitumumab 01/01/2020 ? Cycle 4 FOLFIRI/Panitumumab 01/14/2020 ? Cycle 5 FOLFIRI/Panitumumab 01/28/2020 ? CT abdomen/pelvis 02/06/2020-decreased size of right hepatic lesions, subtle lesion in the lateral left hepatic lobe-not clearly identified, noevidence of disease progression ? Cycle 6 FOLFIRI/Panitumumab 02/11/2020 ? Cycle 7 FOLFIRI/Panitumumab 03/10/2020 ? Cycle 8 FOLFIRI/Panitumumab 03/26/2020 ? Cycle 9 FOLFIRI/Panitumumab 04/14/2020 ? CT abdomen/pelvis 04/26/2020-mild enlargement of 2 liver lesions on initial report, repeat review with radiology consistent with stable disease ? Cycle 10 FOLFIRI/Panitumumab 04/29/2020 ? MRI liver 05/19/2020-right hepatic lobe lesions diminished in size when compared to the study of March 2021. Lesion in hepatic subsegment VI with very limited assessment as well due to adjacent bowel. ? 05/20/2020 referred to interventional radiology to consider ablation of liver lesions ? CT abdomen 06/30/2020 prior to planned  ablation procedure, enlargement of segment 7 liver lesion and segment 6 liver lesion.  Interval recurrence  of segment 2 lesion measuring 4.5 cm, ablation aborted ? 07/23/2020-diagnostic laparoscopy, robotic assisted insertion of hepatic infusion pump, portal lymphadenectomy-granulomatous inflammation compatible with sarcoidosis, core tumor biopsy segment 6, infiltrating adenocarcinoma ? 08/02/2020-cycle 1 FUDR at 50% dose ? 08/16/2020-FOLFIRI/panitumumab ? CTs 10/25/2020-significantly decreased size of hepatic metastases ? 10/25/2020 cycle 6 FOLFIRI/Panitumumab, FUDR # 4 ? Plan for surgery with Dr. Fayrene Helper in mid July 2. History of colon polyps 3. Microcytic anemia January 2019 4. Sarcoidosis 5. Coronary artery disease 6. Port-A-Cath placement, interventional radiology, 03/04/2019 7. Thrombocytopenia, mild thrombocytopenia predating chemotherapy 8. Oxaliplatin neuropathy 9. Dyspnea on exertion-CT chest 02/25/2020 negative for PE, infection, pneumonitis; scheduled for PFTs 03/10/2020, followed by pulmonary    Disposition: Luis [name] appears stable.  He is on active treatment with FOLFIRI/Panitumumab and FUDR at [facility].  Restaging CTs last week showed improvement in liver metastases.  The plan at present is for him to have surgery with Dr. Fayrene Helper in mid July.  He will return for a follow-up visit here the week of 01/03/2021.  We are available to see him sooner if needed.  Patient seen with Dr. Benay Spice.    Ned Card ANP/GNP-BC   11/01/2020  8:38 AM This was a shared visit with Ned Card.  Luis [name] continues FOLFIRI/panitumumab and FUDR under the direction of Dr. Reynaldo Minium and Fayrene Helper.  The liver lesions have decreased in size on this regimen.  The plan is for an attempt at surgical resection in July.  We will see him after surgery.  We are available to see him sooner as needed.  I was present for greater than 50% of today's visit.  I performed medical decision making.  Julieanne Manson,  MD

## 2020-11-06 DIAGNOSIS — S50311A Abrasion of right elbow, initial encounter: Secondary | ICD-10-CM | POA: Diagnosis not present

## 2020-11-06 DIAGNOSIS — S61401A Unspecified open wound of right hand, initial encounter: Secondary | ICD-10-CM | POA: Diagnosis not present

## 2020-11-06 DIAGNOSIS — S80211A Abrasion, right knee, initial encounter: Secondary | ICD-10-CM | POA: Diagnosis not present

## 2020-11-09 DIAGNOSIS — F17200 Nicotine dependence, unspecified, uncomplicated: Secondary | ICD-10-CM | POA: Diagnosis not present

## 2020-11-09 DIAGNOSIS — Z5111 Encounter for antineoplastic chemotherapy: Secondary | ICD-10-CM | POA: Diagnosis not present

## 2020-11-09 DIAGNOSIS — C182 Malignant neoplasm of ascending colon: Secondary | ICD-10-CM | POA: Diagnosis not present

## 2020-11-09 DIAGNOSIS — C787 Secondary malignant neoplasm of liver and intrahepatic bile duct: Secondary | ICD-10-CM | POA: Diagnosis not present

## 2020-11-09 DIAGNOSIS — Z5112 Encounter for antineoplastic immunotherapy: Secondary | ICD-10-CM | POA: Diagnosis not present

## 2020-11-09 DIAGNOSIS — C189 Malignant neoplasm of colon, unspecified: Secondary | ICD-10-CM | POA: Diagnosis not present

## 2020-11-11 DIAGNOSIS — C189 Malignant neoplasm of colon, unspecified: Secondary | ICD-10-CM | POA: Diagnosis not present

## 2020-11-11 DIAGNOSIS — C787 Secondary malignant neoplasm of liver and intrahepatic bile duct: Secondary | ICD-10-CM | POA: Diagnosis not present

## 2020-11-22 DIAGNOSIS — Z20822 Contact with and (suspected) exposure to covid-19: Secondary | ICD-10-CM | POA: Diagnosis not present

## 2020-11-22 DIAGNOSIS — C787 Secondary malignant neoplasm of liver and intrahepatic bile duct: Secondary | ICD-10-CM | POA: Diagnosis not present

## 2020-11-22 DIAGNOSIS — L089 Local infection of the skin and subcutaneous tissue, unspecified: Secondary | ICD-10-CM | POA: Diagnosis not present

## 2020-11-22 DIAGNOSIS — C189 Malignant neoplasm of colon, unspecified: Secondary | ICD-10-CM | POA: Diagnosis not present

## 2020-11-28 DIAGNOSIS — H6691 Otitis media, unspecified, right ear: Secondary | ICD-10-CM | POA: Diagnosis not present

## 2020-12-01 NOTE — Progress Notes (Signed)
@Patient  ID: Luis [name], male    DOB: Mar 04, 1941, 80 y.o.   MRN: [id number]  Chief Complaint  Patient presents with   Follow-up    Requesting to review medications    Referring provider: Hamrick, Lorin Mercy, MD  HPI:   Luis [name] is a 80 year old man with past medical history significant for sarcoidosis treated for years of prednisone currently in remission, significant coronary artery disease status post multiple drug-eluting stents, currently on chemotherapy for adenocarcinoma of the cecum whom we are seeing in follow-up for dyspnea on exertion. Recent note from surgeon in [facility] system reviewed.  Most recent oncology note reviewed.   Doing ok. Dyspnea much better on Breo. Still with nsasal congestion. Wants to decrease Breo since DOE doing better.   HPI at initial visit: Overall, disorders are largely unchanged.  Formally followed with [name] for sarcoidosis.  This has been quiescent for some time.  Most recent CT scan reviewed which reveals on my interpretation stable bilateral fibrosis, scattered calcified granulomas in the lung as well as significant calcified hilar and mediastinal lymphadenopathy and sequela of prior lung surgery.  He has been seen in clinic recently by [name].  Primary complaint was dyspnea on exertion as well as cough.  These persist.  Chest imaging obtained as above.  PFTs obtained last week which reveal suggestion of mild to moderate restriction on spirometry without significant bronchodilator response.  Moderate restriction confirmed with total lung capacity 69% of predicted.  DLCO corrects to within normal limits with adjustment for hemoglobin.  Cough and shortness of breath coincide with worsening nasal congestion.  Postnasal drip.  Not really using meds to address this this.  He is tolerating chemotherapy well.  Review of labs indicate stable hemoglobin without concern for symptomatic anemia at this time.   Questionaires / Pulmonary Flowsheets:    ACT:  No flowsheet data found.  MMRC: mMRC Dyspnea Scale mMRC Score  08/09/2020 0    Epworth:  No flowsheet data found.  Tests:   FENO:  No results found for: NITRICOXIDE  PFT: PFT Results Latest Ref Rng & Units 03/10/2020 08/30/2017  FVC-Pre L 2.94 3.14  FVC-Predicted Pre % 68 72  FVC-Post L 2.90 3.18  FVC-Predicted Post % 67 73  Pre FEV1/FVC % % 66 70  Post FEV1/FCV % % 68 73  FEV1-Pre L 1.93 2.19  FEV1-Predicted Pre % 62 69  FEV1-Post L 1.96 2.31  DLCO uncorrected ml/min/mmHg 19.10 17.73  DLCO UNC% % 75 52  DLCO corrected ml/min/mmHg 21.40 20.68  DLCO COR %Predicted % 84 61  DLVA Predicted % 118 98  TLC L 5.02 4.99  TLC % Predicted % 69 68  RV % Predicted % 76 71    WALK:  SIX MIN WALK 08/28/2017  Supplimental Oxygen during Test? (L/min) No  Tech Comments: pt walked a fast pace, tolerated walk well.     Imaging: Personally reviewed and as per EMR and discussion in this note  No results found.  Lab Results: Personally reviewed, eosinophils routinely 200-300 CBC    Component Value Date/Time   WBC 4.9 07/28/2020 0225   RBC 3.58 (L) 07/28/2020 0225   HGB 10.5 (L) 07/29/2020 0515   HGB 12.1 (L) 06/07/2020 1119   HGB 15.5 12/25/2018 1306   HCT 32.2 (L) 07/29/2020 0515   HCT 45.7 12/25/2018 1306   PLT 160 07/28/2020 0225   PLT 138 (L) 06/07/2020 1119   PLT 142 (L) 12/25/2018 1306   MCV 93.3 07/28/2020  0225   MCV 92 12/25/2018 1306   MCH 30.2 07/28/2020 0225   MCHC 32.3 07/28/2020 0225   RDW 15.4 07/28/2020 0225   RDW 14.4 12/25/2018 1306   LYMPHSABS 0.6 (L) 06/07/2020 1119   MONOABS 0.7 06/07/2020 1119   EOSABS 0.8 (H) 06/07/2020 1119   BASOSABS 0.0 06/07/2020 1119    BMET    Component Value Date/Time   NA 132 (L) 07/28/2020 1828   NA 138 12/25/2018 1306   K 4.1 07/28/2020 1828   CL 100 07/28/2020 1828   CO2 20 (L) 07/28/2020 1828   GLUCOSE 117 (H) 07/28/2020 1828   BUN 10 07/28/2020 1828   BUN 15 12/25/2018 1306   CREATININE 0.80  07/28/2020 1828   CREATININE 0.83 06/07/2020 1119   CREATININE 1.02 05/16/2016 0937   CALCIUM 8.6 (L) 07/28/2020 1828   GFRNONAA >60 07/28/2020 1828   GFRNONAA >60 06/07/2020 1119   GFRAA >60 03/10/2020 0855    BNP    Component Value Date/Time   BNP 43.4 05/16/2016 0937    ProBNP    Component Value Date/Time   PROBNP 60.0 08/10/2014 1052    Specialty Problems       Pulmonary Problems   Pulmonary fibrosis (HCC)   DOE (dyspnea on exertion)    No Known Allergies  Immunization History  Administered Date(s) Administered   DTaP / IPV 09/25/2012   Fluad Quad(high Dose 65+) 02/19/2019   Influenza Split 03/27/2014   Influenza, High Dose Seasonal PF 03/27/2014, 01/27/2017, 02/22/2018   Influenza-Unspecified 02/24/2017, 03/10/2020   PFIZER(Purple Top)SARS-COV-2 Vaccination 07/11/2019, 07/21/2019, 01/26/2020   Pneumococcal Conjugate-13 03/27/2014   Pneumococcal Polysaccharide-23 11/10/2009   Pneumococcal-Unspecified 03/12/2014   Tdap 03/24/2011, 09/25/2012   Zoster Recombinat (Shingrix) 09/01/2018, 02/19/2019   Zoster, Live 03/21/2010    Past Medical History:  Diagnosis Date   Adenocarcinoma of cecum ([place]) 06/21/2017   Arthritis    "mid back; hands; knees" (02/24/2015)   Basal cell carcinoma    left shoulder; mid chest; right eyelid (02/24/2015)   CAD (coronary artery disease)    a. 08/2014 Cath/PCI: LM nl, LAD 30p, D1 95 (2.25x12 Resolute Integrity DES), LCX small, RI 100 (attempted PCI) - branches fill via L->L collats, RCA dominant, nl, RPDA/PLA nl, EF 60^. b. 02/24/2015 PCI CTO of Ramus DES x2.   Chronic bronchitis ([place])    hx   Diverticulosis 05/2005   Elevated lipase    Fuchs' corneal dystrophy    History of adenomatous polyp of colon 05/2005   8 mm adenoma   History of blood transfusion    "related to some of my surgeries"   Hyperlipidemia    Hypertension    Iron deficiency anemia due to chronic blood loss 06/21/2017   Pneumonia    Prostate cancer ([place]) 2011    S/P seed implant   Sarcoidosis    Thrombocytopenia ([place])    a. Noted on prior labs, unclear of what w/u done.   Trifascicular block     Tobacco History: Social History   Tobacco Use  Smoking Status Light Smoker   Packs/day: 0.00   Years: 59.00   Pack years: 0.00   Types: Cigars, Cigarettes  Smokeless Tobacco Never  Tobacco Comments   02/24/2015 "quit cigarettes ~ 2000 ago but smokes cigars once/month"verified 10/07/20/jm   Ready to quit: Not Answered Counseling given: Not Answered Tobacco comments: 02/24/2015 "quit cigarettes ~ 2000 ago but smokes cigars once/month"verified 10/07/20/jm   Continue to not smoke  Outpatient Encounter Medications as of 10/07/2020  Medication Sig   CANNABIDIOL PO Take 12 drops by mouth 2 (two) times a day. CBD OIL   Cholecalciferol (VITAMIN D3) 2000 units capsule Take 2,000 Units by mouth every evening.    clopidogrel (PLAVIX) 75 MG tablet Take 1 tablet (75 mg total) by mouth daily. Hold until catheter is removed by urology next week   fluticasone furoate-vilanterol (BREO ELLIPTA) 200-25 MCG/INH AEPB Inhale 1 puff into the lungs daily.   loratadine (CLARITIN) 10 MG tablet Take 1 tablet (10 mg total) by mouth daily.   metroNIDAZOLE (METROCREAM) 0.75 % cream Apply as directed for rash starting the day before cycle 1 day 1.   ranolazine (RANEXA) 500 MG 12 hr tablet Take 1 tablet (500 mg total) by mouth 2 (two) times daily.   rosuvastatin (CRESTOR) 20 MG tablet TAKE 1 TABLET BY MOUTH EVERY DAY   traMADol (ULTRAM) 50 MG tablet Take 50-100 mg by mouth every 6 (six) hours as needed for moderate pain or severe pain.   dexamethasone (DECADRON) 4 MG tablet Take 2 tablets (8mg  total) by mouth in the morning for 2 days after the first day of each cycle, then as directed. (Patient not taking: No sig reported)   fluticasone (FLONASE) 50 MCG/ACT nasal spray Place 1 spray into both nostrils daily. (Patient not taking: No sig reported)   nitroGLYCERIN (NITROSTAT) 0.4  MG SL tablet PLACE 1 TABLET UNDER THE TONGUE EVERY 5 MINUTES AS NEEDED FOR CHEST PAIN FOR 3 DOSES (Patient not taking: No sig reported)   Polyethylene Glycol 3350 (MIRALAX PO) Take 17 g by mouth daily as needed (constipation). (Patient not taking: No sig reported)   prochlorperazine (COMPAZINE) 10 MG tablet Take 1 tablet (10 mg total) by mouth every 6 (six) hours as needed. (Patient not taking: No sig reported)   [DISCONTINUED] amLODipine (NORVASC) 5 MG tablet TAKE 1 TABLET BY MOUTH TWICE A DAY (Patient not taking: Reported on 10/07/2020)   [DISCONTINUED] furosemide (LASIX) 40 MG tablet Take 1 tablet (40 mg total) by mouth daily. (Patient not taking: No sig reported)   [DISCONTINUED] tamsulosin (FLOMAX) 0.4 MG CAPS capsule Take 0.4 mg by mouth daily. (Patient not taking: Reported on 10/07/2020)   [DISCONTINUED] Turmeric 500 MG CAPS Take 500 mg by mouth daily. (Patient not taking: Reported on 10/07/2020)   No facility-administered encounter medications on file as of 10/07/2020.     Review of Systems  N/a  Physical Exam  BP 130/72 (BP Location: Left Arm, Cuff Size: Normal)   Pulse 77   Temp (!) 97.5 F (36.4 C) (Temporal)   Ht 5' 10.5" (1.791 m)   Wt 239 lb 3.2 oz (108.5 kg)   SpO2 99% Comment: RA  BMI 33.84 kg/m   Wt Readings from Last 5 Encounters:  11/01/20 238 lb 3.2 oz (108 kg)  10/07/20 239 lb 3.2 oz (108.5 kg)  08/20/20 239 lb 8 oz (108.6 kg)  08/09/20 230 lb 9.6 oz (104.6 kg)  07/28/20 239 lb 3.2 oz (108.5 kg)    BMI Readings from Last 5 Encounters:  11/01/20 34.18 kg/m  10/07/20 33.84 kg/m  08/20/20 33.40 kg/m  08/09/20 32.16 kg/m  07/28/20 33.36 kg/m     Physical Exam General: Well-appearing, no acute distress Eyes: EOMI, no icterus Neck: No JVD supple Respiratory:L clear to auscultation bilaterally, no wheezes or crackles Cardiovascular: Regular rhythm, no murmurs Abdomen: Nondistended, bowel sounds present Psych: Normal mood, full affect   Assessment  & Plan:   Dyspnea on exertion: Imaging and PFTs reviewed.  Suspect is multifactorial.  PFTs on my interpretation reveal moderate restriction likely sequela of burned-out sarcoid in addition to prior lung resection when this was diagnosed many years ago.  No evidence of active pulmonary sarcoid on most recent imaging.  No evidence of any pneumonitis from chemotherapy regimen.  He endorsed nasal congestion that coincides with worsening dyspnea raising high suspicion for asthma.    Asthma: Presumed diagnosis given atopic symptoms and dyspnea on exertion. Improved. De-escalate to low dose Breo.     Cough: related to nasal congestion. Continue flonase. Increase anti-histamine to BID.    Return in about 6 months (around 04/08/2021).   Lanier Clam, MD 12/01/2020

## 2020-12-06 DIAGNOSIS — C787 Secondary malignant neoplasm of liver and intrahepatic bile duct: Secondary | ICD-10-CM | POA: Diagnosis not present

## 2020-12-06 DIAGNOSIS — C189 Malignant neoplasm of colon, unspecified: Secondary | ICD-10-CM | POA: Diagnosis not present

## 2020-12-06 DIAGNOSIS — F17200 Nicotine dependence, unspecified, uncomplicated: Secondary | ICD-10-CM | POA: Diagnosis not present

## 2020-12-06 DIAGNOSIS — C182 Malignant neoplasm of ascending colon: Secondary | ICD-10-CM | POA: Diagnosis not present

## 2020-12-06 DIAGNOSIS — I6529 Occlusion and stenosis of unspecified carotid artery: Secondary | ICD-10-CM | POA: Diagnosis not present

## 2020-12-06 DIAGNOSIS — I2510 Atherosclerotic heart disease of native coronary artery without angina pectoris: Secondary | ICD-10-CM | POA: Diagnosis not present

## 2020-12-06 DIAGNOSIS — Z139 Encounter for screening, unspecified: Secondary | ICD-10-CM | POA: Diagnosis not present

## 2020-12-06 DIAGNOSIS — D869 Sarcoidosis, unspecified: Secondary | ICD-10-CM | POA: Diagnosis not present

## 2020-12-06 DIAGNOSIS — I10 Essential (primary) hypertension: Secondary | ICD-10-CM | POA: Diagnosis not present

## 2020-12-06 DIAGNOSIS — J90 Pleural effusion, not elsewhere classified: Secondary | ICD-10-CM | POA: Diagnosis not present

## 2020-12-06 DIAGNOSIS — D500 Iron deficiency anemia secondary to blood loss (chronic): Secondary | ICD-10-CM | POA: Diagnosis not present

## 2020-12-06 DIAGNOSIS — Z01818 Encounter for other preprocedural examination: Secondary | ICD-10-CM | POA: Diagnosis not present

## 2020-12-09 DIAGNOSIS — L089 Local infection of the skin and subcutaneous tissue, unspecified: Secondary | ICD-10-CM | POA: Diagnosis not present

## 2020-12-09 DIAGNOSIS — Z6834 Body mass index (BMI) 34.0-34.9, adult: Secondary | ICD-10-CM | POA: Diagnosis not present

## 2020-12-09 DIAGNOSIS — H6691 Otitis media, unspecified, right ear: Secondary | ICD-10-CM | POA: Diagnosis not present

## 2020-12-09 DIAGNOSIS — Z1331 Encounter for screening for depression: Secondary | ICD-10-CM | POA: Diagnosis not present

## 2020-12-16 ENCOUNTER — Telehealth: Payer: Self-pay | Admitting: Pulmonary Disease

## 2020-12-16 MED ORDER — PREDNISONE 20 MG PO TABS: 40.0000 mg | ORAL_TABLET | Freq: Every day | ORAL | 0 refills | Status: AC

## 2020-12-16 NOTE — Telephone Encounter (Signed)
Received the following message from patient:   "I have gotten a cold.  The cough is productive, and the sputum is an olive-green color.  I do not have a fever, and I can feel congestion in my bronchial area.  Do you want me to come in, or do you want to call in a prescription?  I am scheduled for surgery next Tuesday (7/12), so I would like to get this taken care of as soon as possible. My next scheduled appointment with you will be in October."  I called the patient due to the nature of the MyChart message. He stated that his cough got worse about 3-4 days ago. He has noticed any worsening SOB, just the productive cough. He denied any body aches, fevers or chills. Also denied being around anyone recently who has been sick. He is scheduled to have liver surgery at [facility] on Tuesday.   Pharmacy is [facility] in [place].   MH, can you please advise? Thanks!

## 2020-12-16 NOTE — Telephone Encounter (Signed)
Recommend Covid test. Will send prednisone 40 mg daily x 5 days for presumed asthma exacerbation due to URI. If no better by Monday, needs to be seen in clinic, CXR. If worsens in interim needs to be seen in clinic vs go to ED.

## 2020-12-16 NOTE — Telephone Encounter (Signed)
Spoke with pt who says he is usually treated with prednisone and ABX. Dr. Kavin Leech recommendation where reviewed with pt and pt stated understanding. Nothing further needed at this time.

## 2020-12-23 ENCOUNTER — Telehealth: Payer: Self-pay | Admitting: Pulmonary Disease

## 2020-12-23 ENCOUNTER — Ambulatory Visit
Admission: EM | Admit: 2020-12-23 | Discharge: 2020-12-23 | Disposition: A | Discharge status: Home / Self Care | Payer: Medicare PPO | Attending: Emergency Medicine | Admitting: Emergency Medicine

## 2020-12-23 ENCOUNTER — Ambulatory Visit: Payer: Medicare PPO

## 2020-12-23 ENCOUNTER — Ambulatory Visit (INDEPENDENT_AMBULATORY_CARE_PROVIDER_SITE_OTHER): Payer: Medicare PPO

## 2020-12-23 ENCOUNTER — Other Ambulatory Visit: Payer: Self-pay

## 2020-12-23 DIAGNOSIS — J1282 Pneumonia due to coronavirus disease 2019: Secondary | ICD-10-CM | POA: Diagnosis not present

## 2020-12-23 DIAGNOSIS — I10 Essential (primary) hypertension: Secondary | ICD-10-CM

## 2020-12-23 DIAGNOSIS — R0602 Shortness of breath: Secondary | ICD-10-CM

## 2020-12-23 DIAGNOSIS — U071 COVID-19: Secondary | ICD-10-CM | POA: Diagnosis not present

## 2020-12-23 DIAGNOSIS — R059 Cough, unspecified: Secondary | ICD-10-CM

## 2020-12-23 DIAGNOSIS — J90 Pleural effusion, not elsewhere classified: Secondary | ICD-10-CM | POA: Diagnosis not present

## 2020-12-23 MED ORDER — DOXYCYCLINE HYCLATE 100 MG PO CAPS: 100.0000 mg | ORAL_CAPSULE | Freq: Two times a day (BID) | ORAL | 0 refills | Status: DC

## 2020-12-23 MED ORDER — ALBUTEROL SULFATE HFA 108 (90 BASE) MCG/ACT IN AERS: 1.0000 | INHALATION_SPRAY | Freq: Four times a day (QID) | RESPIRATORY_TRACT | 0 refills | Status: DC | PRN

## 2020-12-23 NOTE — Telephone Encounter (Signed)
I recommend he be seen again and a CXR be performed. I suspect this is ongoing viral symptoms, exacerbating underlying asthma. He has been treated for asthma exacerbation with a course of prednisone and treated for COVID with paxlovid. If feeling worse after this treatment, it is prudent to be seen by a provider and chest imaging be done. Can he be seen at our clinic? Or would he need to go to urgent care? If he can not be seen anywhere please let me know.

## 2020-12-23 NOTE — Telephone Encounter (Signed)
Called and spoke with Patient.  Patient stated he was prescribed prednisone last week for cough, and followed up with NP Saturday, and  was tested for covid.  Patient's covid test was positive and Patient was prescribed Paxlovid.  Patient stated he completed Paxlovid and is isolated.  Patient stated he is having increased sob, feels at times it is difficult to get a full breath. Patient stated he has head congestion, chest congestion, and feels his lungs are full of fluid/mucus.  Patient stated he is not able to cough any sputum from his lungs, but feels it in his chest.  Patient denies fever or chills.   Message routed to Dr. Silas Flood to advise

## 2020-12-23 NOTE — ED Provider Notes (Signed)
Luis [name]    CSN: [id number] Arrival date & time: 12/23/20  1253      History   Chief Complaint Chief Complaint  Patient presents with   Covid Positive   Cough     HPI Luis [name] is a 80 y.o. male.  Patient presents with 10-day history of cough and shortness of breath.  No fever, chills, or other symptoms.  He tested positive for COVID at home on 12/18/2020.  He was treated with prednisone by his pulmonologist on 12/16/2020 prior to the COVID test.  He was treated with Paxlovid by his PCP.  His medical history includes sarcoidosis, chronic bronchitis, pulmonary fibrosis, dyspnea on exertion, hypertension, CAD, obesity, prostate cancer, lung cancer, right lung lobe removal.  The history is provided by the patient and medical records.   Past Medical History:  Diagnosis Date   Adenocarcinoma of cecum ([place]) 06/21/2017   Arthritis    "mid back; hands; knees" (02/24/2015)   Basal cell carcinoma    left shoulder; mid chest; right eyelid (02/24/2015)   CAD (coronary artery disease)    a. 08/2014 Cath/PCI: LM nl, LAD 30p, D1 95 (2.25x12 Resolute Integrity DES), LCX small, RI 100 (attempted PCI) - branches fill via L->L collats, RCA dominant, nl, RPDA/PLA nl, EF 60^. b. 02/24/2015 PCI CTO of Ramus DES x2.   Chronic bronchitis ([place])    hx   Diverticulosis 05/2005   Elevated lipase    Fuchs' corneal dystrophy    History of adenomatous polyp of colon 05/2005   8 mm adenoma   History of blood transfusion    "related to some of my surgeries"   Hyperlipidemia    Hypertension    Iron deficiency anemia due to chronic blood loss 06/21/2017   Pneumonia    Prostate cancer ([place]) 2011   S/P seed implant   Sarcoidosis    Thrombocytopenia ([place])    a. Noted on prior labs, unclear of what w/u done.   Trifascicular block     Patient Active Problem List   Diagnosis Date Noted   Hematuria 07/28/2020   Adenocarcinoma ([place])    Smoker 02/04/2020   Liver metastases ([place]) 08/26/2019    Port-A-Cath in place 03/26/2019   Goals of care, counseling/discussion 02/25/2019   Carotid stenosis 01/07/2019   Cecal cancer ([place]) 07/26/2017   Adenocarcinoma of cecum ([place]) 06/21/2017   Iron deficiency anemia due to chronic blood loss 06/21/2017   DOE (dyspnea on exertion) 03/30/2015   Obesity (BMI 30-39.9) 03/30/2015   Trifascicular block    Nonspecific chest pain 02/24/2015   CAD (coronary artery disease) 08/13/2014   Essential hypertension 08/13/2014   Hyperlipidemia 08/13/2014   Pulmonary fibrosis ([place]) 08/13/2014   Sarcoidosis    Syncope 01/29/2012    Past Surgical History:  Procedure Laterality Date   BASAL CELL CARCINOMA EXCISION Left    shoulder   CARDIAC CATHETERIZATION N/A 02/24/2015   Procedure: Coronary/Bypass Graft CTO Intervention;  Surgeon: Jettie Booze, MD;  Location: [facility];  Service: Cardiovascular;  Laterality: N/A;   CARDIAC CATHETERIZATION  02/24/2015   Procedure: Coronary/Graft Atherectomy;  Surgeon: Jettie Booze, MD;  Location: [facility];  Service: Cardiovascular;;   CATARACT EXTRACTION W/ INTRAOCULAR LENS  IMPLANT, BILATERAL Bilateral ~ 2011-2012   COLONOSCOPY W/ POLYPECTOMY  06/08/2005 and 10/18/2010   8 mm adenoma 2006, none 2012. Diverticulosis and internal hemorrhoids.   CORNEAL TRANSPLANT Bilateral ~ 2011-2012   "@ same time as cataract OR"  CORONARY ANGIOPLASTY WITH STENT PLACEMENT  08/2014; 02/24/2015   "1 stent + 1 stent"   CORONARY STENT INTERVENTION N/A 01/02/2019   Procedure: CORONARY STENT INTERVENTION;  Surgeon: Jettie Booze, MD;  Location: [facility];  Service: Cardiovascular;  Laterality: N/A;   EYE SURGERY     FINGER SURGERY Right 2014   "reattached middle finger"   INSERTION PROSTATE RADIATION SEED  ~ 2011   IR IMAGING GUIDED PORT INSERTION  03/04/2019   IR RADIOLOGIST EVAL & MGMT  05/26/2020   IR US GUIDE BX ASP/DRAIN  03/04/2019   LAPAROSCOPIC CHOLECYSTECTOMY  2015   LAPAROSCOPIC  PARTIAL COLECTOMY N/A 07/26/2017   Procedure: LAPAROSCOPIC PARTIAL COLECTOMY, EXCISION SCROTAL CYST;  Surgeon: [place]er Pickerel, MD;  Location: [facility];  Service: General;  Laterality: N/A;   LEFT HEART CATH AND CORONARY ANGIOGRAPHY N/A 01/02/2019   Procedure: LEFT HEART CATH AND CORONARY ANGIOGRAPHY;  Surgeon: Jettie Booze, MD;  Location: [facility];  Service: Cardiovascular;  Laterality: N/A;   LEFT HEART CATHETERIZATION WITH CORONARY ANGIOGRAM N/A 08/12/2014   Procedure: LEFT HEART CATHETERIZATION WITH CORONARY ANGIOGRAM;  Surgeon: Blane Ohara, MD;  Location: [facility];  Service: Cardiovascular;  Laterality: N/A;   LYMPH NODE BIOPSY     mid chest   MOHS SURGERY Right ~ 2011   "eyelid; for basal cell"   RADIOLOGY WITH ANESTHESIA N/A 06/30/2020   Procedure: IR WITH ANESTHESIA MICROWAVE ABLATION;  Surgeon: Corrie Mckusick, DO;  Location: [facility];  Service: Anesthesiology;  Laterality: N/A;   THOROCOTOMY WITH LOBECTOMY Right ~ 1991   partial removal right lung   TONSILLECTOMY  ~ 1950       Home Medications    Prior to Admission medications   Medication Sig Start Date End Date Taking? Authorizing Provider  albuterol (VENTOLIN HFA) 108 (90 Base) MCG/ACT inhaler Inhale 1-2 puffs into the lungs every 6 (six) hours as needed for wheezing or shortness of breath. 12/23/20  Yes Sharion Balloon, NP  CANNABIDIOL PO Take 12 drops by mouth 2 (two) times a day. CBD OIL   Yes [provider]  Cholecalciferol (VITAMIN D3) 2000 units capsule Take 2,000 Units by mouth every evening.    Yes [provider]  clopidogrel (PLAVIX) 75 MG tablet Take 1 tablet (75 mg total) by mouth daily. Hold until catheter is removed by urology next week 07/29/20  Yes Ardis Hughs, MD  doxycycline (VIBRAMYCIN) 100 MG capsule Take 1 capsule (100 mg total) by mouth 2 (two) times daily. 12/23/20  Yes Sharion Balloon, NP  fluticasone furoate-vilanterol (BREO ELLIPTA) 200-25 MCG/INH AEPB Inhale 1 puff  into the lungs daily. 04/28/20  Yes Hunsucker, Bonna Gains, MD  loratadine (CLARITIN) 10 MG tablet Take 1 tablet (10 mg total) by mouth daily. 08/02/20  Yes Hunsucker, Bonna Gains, MD  nitroGLYCERIN (NITROSTAT) 0.4 MG SL tablet PLACE 1 TABLET UNDER THE TONGUE EVERY 5 MINUTES AS NEEDED FOR CHEST PAIN FOR 3 DOSES 01/16/20  Yes Jettie Booze, MD  ranolazine (RANEXA) 500 MG 12 hr tablet Take 1 tablet (500 mg total) by mouth 2 (two) times daily. 07/14/20  Yes Jettie Booze, MD  rosuvastatin (CRESTOR) 20 MG tablet TAKE 1 TABLET BY MOUTH EVERY DAY 08/17/20  Yes Jettie Booze, MD  traMADol (ULTRAM) 50 MG tablet Take 50-100 mg by mouth every 6 (six) hours as needed for moderate pain or severe pain.   Yes [provider]  dexamethasone (DECADRON) 4 MG tablet  Take 2 tablets (8mg  total) by mouth in the morning for 2 days after the first day of each cycle, then as directed. Patient not taking: No sig reported 07/28/20   [provider]  fluticasone (FLONASE) 50 MCG/ACT nasal spray Place 1 spray into both nostrils daily. Patient not taking: No sig reported 08/02/20   Hunsucker, Bonna Gains, MD  metroNIDAZOLE (METROCREAM) 0.75 % cream Apply as directed for rash starting the day before cycle 1 day 1. 07/28/20   [provider]  Polyethylene Glycol 3350 (MIRALAX PO) Take 17 g by mouth daily as needed (constipation). Patient not taking: No sig reported    [provider]  prochlorperazine (COMPAZINE) 10 MG tablet Take 1 tablet (10 mg total) by mouth every 6 (six) hours as needed. Patient not taking: No sig reported 02/26/19   Ladell [place], MD    Family History Family History  Problem Relation Age of Onset   Heart disease Father 71       Died from "hardening of the arteries" age 25   Coronary artery disease Sister 50   Heart attack Sister    Colon cancer Neg Hx    Throat cancer Neg Hx    Pancreatic cancer Neg Hx    Prostate cancer Neg Hx    Stroke Neg Hx     Hypertension Neg Hx     Social History Social History   Tobacco Use   Smoking status: Light Smoker    Packs/day: 0.00    Years: 59.00    Pack years: 0.00    Types: Cigars, Cigarettes   Smokeless tobacco: Never   Tobacco comments:    02/24/2015 "quit cigarettes ~ 2000 ago but smokes cigars once/month"verified 10/07/20/jm  Vaping Use   Vaping Use: Never used  Substance Use Topics   Alcohol use: Yes    Alcohol/week: 2.0 standard drinks    Types: 1 Glasses of wine, 1 Standard drinks or equivalent per week    Comment: Drinks maybe 1-2 drinks a week or less   Drug use: No     Allergies   Patient has no known allergies.   Review of Systems Review of Systems  Constitutional:  Negative for chills and fever.  Respiratory:  Positive for cough and shortness of breath.   Cardiovascular:  Negative for chest pain and palpitations.  Skin:  Negative for color change and rash.  All other systems reviewed and are negative.   Physical Exam Triage Vital Signs ED Triage Vitals  Enc Vitals Group     BP      Pulse      Resp      Temp      Temp src      SpO2      Weight      Height      Head Circumference      Peak Flow      Pain Score      Pain Loc      Pain Edu?      Excl. in [place]?    No data found.  Updated Vital Signs BP (!) 173/84 (BP Location: Left Arm)   Pulse 79   Temp 98.8 F (37.1 C) (Oral)   Resp 20   SpO2 94%   Visual Acuity Right Eye Distance:   Left Eye Distance:   Bilateral Distance:    Right Eye Near:   Left Eye Near:    Bilateral Near:     Physical Exam Vitals and  nursing note reviewed.  Constitutional:      General: He is not in acute distress.    Appearance: He is well-developed.  HENT:     Head: Normocephalic and atraumatic.     Mouth/Throat:     Mouth: Mucous membranes are moist.  Eyes:     Conjunctiva/sclera: Conjunctivae normal.  Cardiovascular:     Rate and Rhythm: Normal rate and regular rhythm.     Heart sounds: Normal heart  sounds.  Pulmonary:     Effort: Pulmonary effort is normal. No respiratory distress.     Breath sounds: Rhonchi present.     Comments: Scattered rhonchi throughout right lung and in left lower lung. Abdominal:     Palpations: Abdomen is soft.     Tenderness: There is no abdominal tenderness.  Musculoskeletal:     Cervical back: Neck supple.  Skin:    General: Skin is warm and dry.  Neurological:     General: No focal deficit present.     Mental Status: He is alert and oriented to person, place, and time.     Gait: Gait normal.  Psychiatric:        Mood and Affect: Mood normal.        Behavior: Behavior normal.     UC Treatments / Results  Labs (all labs ordered are listed, but only abnormal results are displayed) Labs Reviewed - No data to display  EKG   Radiology DG Chest 2 View  Result Date: 12/23/2020 CLINICAL DATA:  Cough and shortness of breath.  COVID positive. EXAM: CHEST - 2 VIEW COMPARISON:  CT chest dated Nov 01, 2020. Chest x-ray dated May 26, 2020. FINDINGS: Unchanged right chest wall port catheter. Stable cardiomediastinal silhouette with calcified mediastinal and bilateral hilar nodes. Stable scarring and postsurgical changes of the right lung. New small right pleural effusion. No consolidation or pneumothorax. No acute osseous abnormality. IMPRESSION: 1. New small right pleural effusion. Electronically Signed   By: Titus Dubin M.D.   On: 12/23/2020 13:41    Procedures Procedures (including critical care time)  Medications Ordered in UC Medications - No data to display  Initial Impression / Assessment and Plan / UC Course  I have reviewed the triage vital signs and the nursing notes.  Pertinent labs & imaging results that were available during my care of the patient were reviewed by me and considered in my medical decision making (see chart for details).  COVID-19, pneumonia due to COVID-19, elevated blood pressure reading with known hypertension.   O2 sat 94% on room air.  Treating with doxycycline and albuterol inhaler.  Patient just completed a course of prednisone.  He also completed treatment with Paxlovid this morning.  Instructed patient to schedule a follow-up appointment with his PCP or pulmonologist for a recheck of his lungs in 2 to 3 days.  Strict ED precautions discussed at length.  Also discussed that his blood pressure is elevated today and needs to be rechecked by his PCP in 1 to 2 weeks.  He agrees to plan of care.   Final Clinical Impressions(s) / UC Diagnoses   Final diagnoses:  COVID-19  Pneumonia due to COVID-19 virus  Elevated blood pressure reading in office with diagnosis of hypertension     Discharge Instructions      Take the doxycycline and use the albuterol inhaler as directed.  Schedule a follow-up appointment with your primary care provider or pulmonologist for a recheck of your lungs in 2-3 days.  Go to the emergency department if you have acute shortness of breath or other concerning symptoms.  Your blood pressure is elevated today at 173/84.  Please have this rechecked by your primary care provider in 1-2 weeks.          ED Prescriptions     Medication Sig Dispense Auth. Provider   albuterol (VENTOLIN HFA) 108 (90 Base) MCG/ACT inhaler Inhale 1-2 puffs into the lungs every 6 (six) hours as needed for wheezing or shortness of breath. 18 g Sharion Balloon, NP   doxycycline (VIBRAMYCIN) 100 MG capsule Take 1 capsule (100 mg total) by mouth 2 (two) times daily. 20 capsule Sharion Balloon, NP      PDMP not reviewed this encounter.   Sharion Balloon, NP 12/23/20 1347

## 2020-12-23 NOTE — Telephone Encounter (Signed)
Dr. Silas Flood, please see new message from pt stating that he did get a cxr performed.  Pt went to [facility] today and they did a cxr while there which can be seen in epic.

## 2020-12-23 NOTE — Telephone Encounter (Signed)
I spoke with the pt and made aware of response per Dr Silas Flood. I advised it's very important that he be seen, but unfortunately we have no openings with any of the MD's or NP's here in the office for the remainder of this week and next wk. He is able to go to urgent care, his spouse was on the phone as well and she stated she would take him today. They are going to go to the [facility] in [place]. I am forwarding back to Dr Silas Flood to make him aware.

## 2020-12-23 NOTE — ED Triage Notes (Signed)
Pt started on Prednisone last week then tested positive for COVID on Sat 07/09.  Finished Prednisone 07/11 and completed Paxlovid  this morning.  Cough is not improving, feels tight in lungs.   Pt had upper part of R lung lobe removed previously.   Pt currently has liver CA that had metastasized from colon and was scheduled for surgery 07/12.

## 2020-12-23 NOTE — Discharge Instructions (Addendum)
Take the doxycycline and use the albuterol inhaler as directed.  Schedule a follow-up appointment with your primary care provider or pulmonologist for a recheck of your lungs in 2-3 days.    Go to the emergency department if you have acute shortness of breath or other concerning symptoms.  Your blood pressure is elevated today at 173/84.  Please have this rechecked by your primary care provider in 1-2 weeks.

## 2020-12-26 ENCOUNTER — Encounter: Payer: Self-pay | Admitting: Oncology

## 2020-12-30 DIAGNOSIS — D500 Iron deficiency anemia secondary to blood loss (chronic): Secondary | ICD-10-CM | POA: Diagnosis not present

## 2020-12-30 DIAGNOSIS — Z01818 Encounter for other preprocedural examination: Secondary | ICD-10-CM | POA: Diagnosis not present

## 2020-12-30 DIAGNOSIS — C182 Malignant neoplasm of ascending colon: Secondary | ICD-10-CM | POA: Diagnosis not present

## 2020-12-30 DIAGNOSIS — C180 Malignant neoplasm of cecum: Secondary | ICD-10-CM | POA: Diagnosis not present

## 2020-12-30 DIAGNOSIS — D869 Sarcoidosis, unspecified: Secondary | ICD-10-CM | POA: Diagnosis not present

## 2020-12-30 DIAGNOSIS — I6529 Occlusion and stenosis of unspecified carotid artery: Secondary | ICD-10-CM | POA: Diagnosis not present

## 2020-12-30 DIAGNOSIS — I2510 Atherosclerotic heart disease of native coronary artery without angina pectoris: Secondary | ICD-10-CM | POA: Diagnosis not present

## 2020-12-30 DIAGNOSIS — Z8546 Personal history of malignant neoplasm of prostate: Secondary | ICD-10-CM | POA: Diagnosis not present

## 2020-12-30 DIAGNOSIS — C787 Secondary malignant neoplasm of liver and intrahepatic bile duct: Secondary | ICD-10-CM | POA: Diagnosis not present

## 2020-12-30 DIAGNOSIS — I10 Essential (primary) hypertension: Secondary | ICD-10-CM | POA: Diagnosis not present

## 2020-12-30 DIAGNOSIS — F17200 Nicotine dependence, unspecified, uncomplicated: Secondary | ICD-10-CM | POA: Diagnosis not present

## 2021-01-03 ENCOUNTER — Inpatient Hospital Stay: Payer: Medicare PPO | Admitting: Oncology

## 2021-01-03 DIAGNOSIS — C787 Secondary malignant neoplasm of liver and intrahepatic bile duct: Secondary | ICD-10-CM | POA: Diagnosis not present

## 2021-01-03 DIAGNOSIS — C189 Malignant neoplasm of colon, unspecified: Secondary | ICD-10-CM | POA: Diagnosis not present

## 2021-01-03 DIAGNOSIS — Z9109 Other allergy status, other than to drugs and biological substances: Secondary | ICD-10-CM | POA: Diagnosis not present

## 2021-01-03 DIAGNOSIS — G8918 Other acute postprocedural pain: Secondary | ICD-10-CM | POA: Diagnosis not present

## 2021-01-03 DIAGNOSIS — Z87891 Personal history of nicotine dependence: Secondary | ICD-10-CM | POA: Diagnosis not present

## 2021-01-03 DIAGNOSIS — I2510 Atherosclerotic heart disease of native coronary artery without angina pectoris: Secondary | ICD-10-CM | POA: Diagnosis not present

## 2021-01-03 DIAGNOSIS — Z79899 Other long term (current) drug therapy: Secondary | ICD-10-CM | POA: Diagnosis not present

## 2021-01-03 DIAGNOSIS — Z7902 Long term (current) use of antithrombotics/antiplatelets: Secondary | ICD-10-CM | POA: Diagnosis not present

## 2021-01-03 DIAGNOSIS — C180 Malignant neoplasm of cecum: Secondary | ICD-10-CM | POA: Diagnosis not present

## 2021-01-03 DIAGNOSIS — K229 Disease of esophagus, unspecified: Secondary | ICD-10-CM | POA: Diagnosis not present

## 2021-01-03 DIAGNOSIS — J811 Chronic pulmonary edema: Secondary | ICD-10-CM | POA: Diagnosis not present

## 2021-01-03 DIAGNOSIS — J90 Pleural effusion, not elsewhere classified: Secondary | ICD-10-CM | POA: Diagnosis not present

## 2021-01-03 DIAGNOSIS — I10 Essential (primary) hypertension: Secondary | ICD-10-CM | POA: Diagnosis not present

## 2021-01-03 DIAGNOSIS — Z01818 Encounter for other preprocedural examination: Secondary | ICD-10-CM | POA: Diagnosis not present

## 2021-01-03 DIAGNOSIS — Z8546 Personal history of malignant neoplasm of prostate: Secondary | ICD-10-CM | POA: Diagnosis not present

## 2021-01-10 ENCOUNTER — Other Ambulatory Visit: Payer: Self-pay | Admitting: Pulmonary Disease

## 2021-01-10 DIAGNOSIS — J90 Pleural effusion, not elsewhere classified: Secondary | ICD-10-CM

## 2021-01-10 NOTE — Progress Notes (Signed)
2 week follow up CXR since new pleural effusion on CXR performed in ED 7/14.

## 2021-01-11 NOTE — Telephone Encounter (Signed)
Ok - understood. I reviewed the reports. Similar report of right sided pleural effusion. We will not plan on reepat imaging for now. I trust his [place] doctors will look after it as well.

## 2021-01-11 NOTE — Telephone Encounter (Signed)
Spoke with pt. Pt states he was discharged from [facility] 01/10/21 after having a liver resection due to mets. He had a CT Chest/Abd./ Pelvis while there on 7/25//22 (Care Everywhere). Dr Silas Flood, could you review those results and see if you still want pt to come for cxr. Due to his recent surgery pt states it would be several days before he would be able to come for cxr.

## 2021-01-12 ENCOUNTER — Emergency Department (HOSPITAL_COMMUNITY)
Admission: EM | Admit: 2021-01-12 | Discharge: 2021-01-12 | Disposition: A | Discharge status: Home / Self Care | Payer: Medicare PPO | Attending: Emergency Medicine | Admitting: Emergency Medicine

## 2021-01-12 ENCOUNTER — Encounter (HOSPITAL_COMMUNITY): Payer: Self-pay

## 2021-01-12 ENCOUNTER — Other Ambulatory Visit: Payer: Self-pay

## 2021-01-12 ENCOUNTER — Emergency Department (HOSPITAL_COMMUNITY): Payer: Medicare PPO

## 2021-01-12 DIAGNOSIS — R0602 Shortness of breath: Secondary | ICD-10-CM | POA: Diagnosis not present

## 2021-01-12 DIAGNOSIS — R918 Other nonspecific abnormal finding of lung field: Secondary | ICD-10-CM | POA: Diagnosis not present

## 2021-01-12 DIAGNOSIS — Z85828 Personal history of other malignant neoplasm of skin: Secondary | ICD-10-CM | POA: Insufficient documentation

## 2021-01-12 DIAGNOSIS — F17210 Nicotine dependence, cigarettes, uncomplicated: Secondary | ICD-10-CM | POA: Diagnosis not present

## 2021-01-12 DIAGNOSIS — U071 COVID-19: Secondary | ICD-10-CM | POA: Diagnosis not present

## 2021-01-12 DIAGNOSIS — R600 Localized edema: Secondary | ICD-10-CM | POA: Insufficient documentation

## 2021-01-12 DIAGNOSIS — Z8505 Personal history of malignant neoplasm of liver: Secondary | ICD-10-CM | POA: Insufficient documentation

## 2021-01-12 DIAGNOSIS — Z85038 Personal history of other malignant neoplasm of large intestine: Secondary | ICD-10-CM | POA: Diagnosis not present

## 2021-01-12 DIAGNOSIS — Z85048 Personal history of other malignant neoplasm of rectum, rectosigmoid junction, and anus: Secondary | ICD-10-CM | POA: Insufficient documentation

## 2021-01-12 DIAGNOSIS — I2510 Atherosclerotic heart disease of native coronary artery without angina pectoris: Secondary | ICD-10-CM | POA: Diagnosis not present

## 2021-01-12 DIAGNOSIS — J9811 Atelectasis: Secondary | ICD-10-CM | POA: Diagnosis not present

## 2021-01-12 DIAGNOSIS — I10 Essential (primary) hypertension: Secondary | ICD-10-CM | POA: Diagnosis not present

## 2021-01-12 DIAGNOSIS — F17290 Nicotine dependence, other tobacco product, uncomplicated: Secondary | ICD-10-CM | POA: Diagnosis not present

## 2021-01-12 DIAGNOSIS — Z7902 Long term (current) use of antithrombotics/antiplatelets: Secondary | ICD-10-CM | POA: Diagnosis not present

## 2021-01-12 DIAGNOSIS — Z8546 Personal history of malignant neoplasm of prostate: Secondary | ICD-10-CM | POA: Insufficient documentation

## 2021-01-12 DIAGNOSIS — I700 Atherosclerosis of aorta: Secondary | ICD-10-CM | POA: Diagnosis not present

## 2021-01-12 DIAGNOSIS — J90 Pleural effusion, not elsewhere classified: Secondary | ICD-10-CM | POA: Diagnosis not present

## 2021-01-12 LAB — CBC WITH DIFFERENTIAL/PLATELET
Abs Immature Granulocytes: 0.02 10*3/uL (ref 0.00–0.07)
Basophils Absolute: 0 10*3/uL (ref 0.0–0.1)
Basophils Relative: 0 %
Eosinophils Absolute: 0.2 10*3/uL (ref 0.0–0.5)
Eosinophils Relative: 5 %
HCT: 28.5 % — ABNORMAL LOW (ref 39.0–52.0)
Hemoglobin: 8.8 g/dL — ABNORMAL LOW (ref 13.0–17.0)
Immature Granulocytes: 1 %
Lymphocytes Relative: 7 %
Lymphs Abs: 0.3 10*3/uL — ABNORMAL LOW (ref 0.7–4.0)
MCH: 29.8 pg (ref 26.0–34.0)
MCHC: 30.9 g/dL (ref 30.0–36.0)
MCV: 96.6 fL (ref 80.0–100.0)
Monocytes Absolute: 0.7 10*3/uL (ref 0.1–1.0)
Monocytes Relative: 15 %
Neutro Abs: 3.2 10*3/uL (ref 1.7–7.7)
Neutrophils Relative %: 72 %
Platelets: 167 10*3/uL (ref 150–400)
RBC: 2.95 MIL/uL — ABNORMAL LOW (ref 4.22–5.81)
RDW: 16.7 % — ABNORMAL HIGH (ref 11.5–15.5)
WBC: 4.4 10*3/uL (ref 4.0–10.5)
nRBC: 0 % (ref 0.0–0.2)

## 2021-01-12 LAB — COMPREHENSIVE METABOLIC PANEL
ALT: 84 U/L — ABNORMAL HIGH (ref 0–44)
AST: 49 U/L — ABNORMAL HIGH (ref 15–41)
Albumin: 3.5 g/dL (ref 3.5–5.0)
Alkaline Phosphatase: 110 U/L (ref 38–126)
Anion gap: 7 (ref 5–15)
BUN: 9 mg/dL (ref 8–23)
CO2: 27 mmol/L (ref 22–32)
Calcium: 8.6 mg/dL — ABNORMAL LOW (ref 8.9–10.3)
Chloride: 102 mmol/L (ref 98–111)
Creatinine, Ser: 0.66 mg/dL (ref 0.61–1.24)
GFR, Estimated: >60 mL/min (ref >60)
Glucose, Bld: 108 mg/dL — ABNORMAL HIGH (ref 70–99)
Potassium: 3.7 mmol/L (ref 3.5–5.1)
Sodium: 136 mmol/L (ref 135–145)
Total Bilirubin: 0.9 mg/dL (ref 0.3–1.2)
Total Protein: 6.5 g/dL (ref 6.5–8.1)

## 2021-01-12 LAB — TROPONIN I (HIGH SENSITIVITY)
Troponin I (High Sensitivity): 10 ng/L (ref <18)
Troponin I (High Sensitivity): 10 ng/L (ref <18)

## 2021-01-12 LAB — RESP PANEL BY RT-PCR (FLU A&B, COVID) ARPGX2
Influenza A by PCR: NEGATIVE
Influenza B by PCR: NEGATIVE
SARS Coronavirus 2 by RT PCR: POSITIVE — AB

## 2021-01-12 LAB — D-DIMER, QUANTITATIVE: D-Dimer, Quant: 5.67 ug/mL-FEU — ABNORMAL HIGH (ref 0.00–0.50)

## 2021-01-12 LAB — BRAIN NATRIURETIC PEPTIDE: B Natriuretic Peptide: 89.2 pg/mL (ref 0.0–100.0)

## 2021-01-12 MED ORDER — FUROSEMIDE 20 MG PO TABS: 20.0000 mg | ORAL_TABLET | Freq: Every day | ORAL | 0 refills | Status: DC

## 2021-01-12 MED ORDER — IOHEXOL 350 MG/ML SOLN
80.0000 mL | Freq: Once | INTRAVENOUS | Status: AC | PRN
  Administered 2021-01-12: 75 mL via INTRAVENOUS

## 2021-01-12 MED ORDER — FUROSEMIDE 10 MG/ML IJ SOLN
20.0000 mg | Freq: Once | INTRAMUSCULAR | Status: AC
  Administered 2021-01-12: 20 mg via INTRAVENOUS
  Filled 2021-01-12: qty 4

## 2021-01-12 NOTE — ED Triage Notes (Signed)
Pt endorses progressive SHOB since Thursday. Pt had a liver resection and multiple tumors removed from mid chest last week.

## 2021-01-12 NOTE — ED Provider Notes (Signed)
Emergency Medicine Provider Triage Evaluation Note  LOHGAN ALGHAMDI , a 80 y.o. male  was evaluated in triage.  Pt complains of sob. Was recently admitted for [facility] (Dr. Ivery Quale) for liver resection. Started to become sob during admission and it has since worsened. Reports abd distension and ble swelling.  Review of Systems  Positive: Abd distension, sob, leg swelling Negative: fever  Physical Exam  BP (!) 177/86 (BP Location: Left Arm)   Pulse 88   Temp 98.3 F (36.8 C) (Oral)   Resp 18   SpO2 94%  Gen:   Awake, no distress   Resp:  Normal effort  MSK:   Moves extremities without difficulty  Other:  Ble edema, abd distension w/o ttp  Medical Decision Making  Medically screening exam initiated at 1:02 PM.  Appropriate orders placed.  TANYA RUNNING was informed that the remainder of the evaluation will be completed by another provider, this initial triage assessment does not replace that evaluation, and the importance of remaining in the ED until their evaluation is complete.     Rodney Booze, PA-C 01/12/21 1306    Regan Lemming, MD 01/12/21 1430

## 2021-01-12 NOTE — Telephone Encounter (Signed)
Called and spoke with patient. He verbalized understanding. He wanted to know if our office would reach out to [place] on his behalf since he is not sure who he needs to contact. I advised him that I would call the office and ask for them to reach out to him.   He did state that he was feeling a little better this morning. The pain had gotten so bad last night that he considered calling EMS. I advised him if the pain starts to increase again and his SOB gets worse to call 911. He verbalized understanding.   I called the [facility] at [phone] and spoke with [facility]. She stated that the patient needed to call the triage line. Their number is [phone], opt 6.   Called patient back to provide him with this information. He verbalized understanding. He will call them today.   Nothing further needed at time of call.

## 2021-01-12 NOTE — Telephone Encounter (Signed)
He should contact surgeon from [facility], where he recently had liver resection, to notify them of his worsening symptoms. If unable to speak with their team I would present to ED d/t worsening sob and chest discomfort. I will forward to Dr. Silas Flood as he will be checking his messages in the evening.

## 2021-01-12 NOTE — ED Provider Notes (Signed)
Care assumed from [facility], [place], at shift change, please see their notes for full documentation of patient's complaint/HPI. Briefly, pt here with shortness of breath mostly with laying flat. Recent laparoscopic procedure last week at [facility]. Results so far show CXR with right sided pleural effusion, hgb of 8.8 (likely from recent procedure), and D dimer 5.67. Awaiting repeat troponin,  BNP, and CTA. Plan is to dispo accordingly.   Physical Exam  BP (!) 165/86   Pulse 78   Temp 98.3 F (36.8 C) (Oral)   Resp 18   SpO2 98%   Physical Exam  ED Course/Procedures   Clinical Course as of 01/12/21 1757  Wed Jan 12, 2021  1704 SARS Coronavirus 2 by RT PCR(!): POSITIVE [MV]    Clinical Course User Index [MV] Eustaquio Maize, PA-C    Procedures  Results for orders placed or performed during the hospital encounter of 01/12/21  Resp Panel by RT-PCR (Flu A&B, Covid) Nasopharyngeal Swab   Specimen: Nasopharyngeal Swab; Nasopharyngeal(NP) swabs in vial transport medium  Result Value Ref Range   SARS Coronavirus 2 by RT PCR POSITIVE (A) NEGATIVE   Influenza A by PCR NEGATIVE NEGATIVE   Influenza B by PCR NEGATIVE NEGATIVE  Comprehensive metabolic panel  Result Value Ref Range   Sodium 136 135 - 145 mmol/L   Potassium 3.7 3.5 - 5.1 mmol/L   Chloride 102 98 - 111 mmol/L   CO2 27 22 - 32 mmol/L   Glucose, Bld 108 (H) 70 - 99 mg/dL   BUN 9 8 - 23 mg/dL   Creatinine, Ser 0.66 0.61 - 1.24 mg/dL   Calcium 8.6 (L) 8.9 - 10.3 mg/dL   Total Protein 6.5 6.5 - 8.1 g/dL   Albumin 3.5 3.5 - 5.0 g/dL   AST 49 (H) 15 - 41 U/L   ALT 84 (H) 0 - 44 U/L   Alkaline Phosphatase 110 38 - 126 U/L   Total Bilirubin 0.9 0.3 - 1.2 mg/dL   GFR, Estimated >60 >60 mL/min   Anion gap 7 5 - 15  CBC with Differential  Result Value Ref Range   WBC 4.4 4.0 - 10.5 K/uL   RBC 2.95 (L) 4.22 - 5.81 MIL/uL   Hemoglobin 8.8 (L) 13.0 - 17.0 g/dL   HCT 28.5 (L) 39.0 - 52.0 %   MCV 96.6 80.0 - 100.0 fL   MCH 29.8  26.0 - 34.0 pg   MCHC 30.9 30.0 - 36.0 g/dL   RDW 16.7 (H) 11.5 - 15.5 %   Platelets 167 150 - 400 K/uL   nRBC 0.0 0.0 - 0.2 %   Neutrophils Relative % 72 %   Neutro Abs 3.2 1.7 - 7.7 K/uL   Lymphocytes Relative 7 %   Lymphs Abs 0.3 (L) 0.7 - 4.0 K/uL   Monocytes Relative 15 %   Monocytes Absolute 0.7 0.1 - 1.0 K/uL   Eosinophils Relative 5 %   Eosinophils Absolute 0.2 0.0 - 0.5 K/uL   Basophils Relative 0 %   Basophils Absolute 0.0 0.0 - 0.1 K/uL   Immature Granulocytes 1 %   Abs Immature Granulocytes 0.02 0.00 - 0.07 K/uL  Brain natriuretic peptide  Result Value Ref Range   B Natriuretic Peptide 89.2 0.0 - 100.0 pg/mL  D-dimer, quantitative  Result Value Ref Range   D-Dimer, Quant 5.67 (H) 0.00 - 0.50 ug/mL-FEU  Troponin I (High Sensitivity)  Result Value Ref Range   Troponin I (High Sensitivity) 10 <18 ng/L  Troponin I (  High Sensitivity)  Result Value Ref Range   Troponin I (High Sensitivity) 10 <18 ng/L   DG Chest 2 View  Result Date: 01/12/2021 CLINICAL DATA:  sob, fluid overload EXAM: CHEST - 2 VIEW COMPARISON:  12/23/2020 FINDINGS: Stable cardiomediastinal contours. Atherosclerotic calcification of the aortic knob. Right-sided chest port remains in place. Moderate right-sided pleural effusion, increased from prior. Hazy right basilar opacity. Suture line noted within the right lung base. No pneumothorax. IMPRESSION: Moderate right-sided pleural effusion, increased from prior. Electronically Signed   By: Davina Poke D.O.   On: 01/12/2021 14:03   CT Angio Chest PE W/Cm &/Or Wo Cm  Result Date: 01/12/2021 CLINICAL DATA:  Progressive shortness of breath since last week, history of metastatic colorectal cancer, recent resection of liver and chest metastases EXAM: CT ANGIOGRAPHY CHEST WITH CONTRAST TECHNIQUE: Multidetector CT imaging of the chest was performed using the standard protocol during bolus administration of intravenous contrast. Multiplanar CT image reconstructions  and MIPs were obtained to evaluate the vascular anatomy. CONTRAST:  79m OMNIPAQUE IOHEXOL 350 MG/ML SOLN COMPARISON:  05/26/2020, 01/12/2021 FINDINGS: Cardiovascular: This is a technically suboptimal contrast bolus. There is sufficient contrast enhancement to exclude central and proximal segmental pulmonary emboli. The distal segmental and subsegmental branches are incompletely opacified and cannot be fully evaluated. The heart is unremarkable without pericardial effusion. No evidence of thoracic aortic aneurysm or dissection. Moderate atherosclerosis of the aorta and coronary vessels. Right chest wall port tip within the superior vena cava. Mediastinum/Nodes: Numerous calcified mediastinal and hilar lymph nodes are unchanged. No pathologic adenopathy. Thyroid, trachea, and esophagus are stable. Lungs/Pleura: There is a small right pleural effusion, partially loculated at the right apex. Volume estimated less than 1 L. stable areas of consolidation in the right upper lobe likely scarring. Minimal compressive atelectasis left lower lobe. No pneumothorax. Central airways are patent. Upper Abdomen: Hypodensity right lobe liver measuring up to 4 cm consistent with metastatic disease, increased since prior study. Evaluation of the liver is limited due to timing of contrast bolus. There is a small amount of fluid along the capsule left lobe liver measuring 2.4 x 3.8 cm, please correlate with recent surgical history as this could reflect postoperative seroma. There is a surgical drain in the central upper abdomen interposed between the stomach and liver. Musculoskeletal: Subcutaneous gas within the chest wall likely related to recent surgical intervention. Please correlate with operative history. There are no acute or destructive bony lesions. Reconstructed images demonstrate no additional findings. Review of the MIP images confirms the above findings. IMPRESSION: 1. Limited evaluation of the pulmonary vasculature due to  suboptimal contrast bolus. No central or proximal segmental pulmonary emboli. 2. Small right pleural effusion, partially loculated at the right apex, volume estimated less than 1 L. 3. Chronic right upper lobe consolidation likely scarring. Compressive atelectasis right lower lobe. 4. Enlarging liver hypodensity consistent with metastatic disease. 5. Postsurgical changes central upper abdomen, with likely postoperative seroma along the inferior margin left lobe liver. 6.  Aortic Atherosclerosis (ICD10-I70.0). Electronically Signed   By: MRanda NgoM.D.   On: 01/12/2021 16:36    MDM  CTA negative for PE. Does show small R pleural effusion.  Repeat troponin unchanged at 10. BNP within normal limits at this time.   Pt able to ambulate with pulse ox 95-100%. Reports improvement after lasix today. His COVID test did return positive however he had COVID at the beginning of July and was on Paxlovid for same. Will discharge home at this time  with PCP follow up. Will prescribe short course of lasix. Pt instructed to see PCP this week to have potassium level rechecked. He is in agreement with plan and stable for discharge home.   This note was prepared using Dragon voice recognition software and may include unintentional dictation errors due to the inherent limitations of voice recognition software.        Eustaquio Maize, PA-C 01/12/21 [place], Ankit, MD 01/13/21 [phone]

## 2021-01-12 NOTE — Discharge Instructions (Addendum)
Please follow up with your PCP regarding ED visit today. I have prescribed a very short course of Lasix for you. Please take as prescribed. You will need to have your potassium level rechecked in 1-2 weeks as lasix can decrease your potassium.   Return to the ED for any new/worsening symptoms

## 2021-01-12 NOTE — ED Provider Notes (Signed)
[name] DEPT Provider Note   CSN: [id number] Arrival date & time: 01/12/21  1233     History Chief Complaint  Patient presents with   Shortness of Breath    Luis [name] is a 80 y.o. male with pertinent past medical history of CAD, colorectal cancer metastasized to the liver with recent diagnostic laparoscopically at [facility] last week.  Patient was discharged after good hospital course, patient states that while he was in the hospital he developed shortness of breath, only when he laid down.  Patient states that they were not concerned about this and when he was discharged he was not short of breath while walking.  Patient states that the shortness of breath has been progressively worse since he was discharged on Thursday.  States that its worse when he lays down.  Denies any cough or URI symptoms.  Denies any fevers.  Denies any chest pain, does admit to some leg swelling and abdominal swelling.  Does not have a history of heart failure.  Patient also admits to stopping his Plavix for the surgery, states that surgery was supposed to be scheduled 712 however got COVID therefore surgery had to be postponed.  However patient stopped taking the Plavix during 712 and also patient had missed multiple weeks of Plavix.  Denies any leg swelling or calf pain.  Denies any abdominal pain, nausea, vomiting.  Denies any hemoptysis.  HPI     Past Medical History:  Diagnosis Date   Adenocarcinoma of cecum ([place]) 06/21/2017   Arthritis    "mid back; hands; knees" (02/24/2015)   Basal cell carcinoma    left shoulder; mid chest; right eyelid (02/24/2015)   CAD (coronary artery disease)    a. 08/2014 Cath/PCI: LM nl, LAD 30p, D1 95 (2.25x12 Resolute Integrity DES), LCX small, RI 100 (attempted PCI) - branches fill via L->L collats, RCA dominant, nl, RPDA/PLA nl, EF 60^. b. 02/24/2015 PCI CTO of Ramus DES x2.   Chronic bronchitis ([place])    hx   Diverticulosis 05/2005   Elevated  lipase    Fuchs' corneal dystrophy    History of adenomatous polyp of colon 05/2005   8 mm adenoma   History of blood transfusion    "related to some of my surgeries"   Hyperlipidemia    Hypertension    Iron deficiency anemia due to chronic blood loss 06/21/2017   Pneumonia    Prostate cancer ([place]) 2011   S/P seed implant   Sarcoidosis    Thrombocytopenia ([place])    a. Noted on prior labs, unclear of what w/u done.   Trifascicular block     Patient Active Problem List   Diagnosis Date Noted   Hematuria 07/28/2020   Adenocarcinoma ([place])    Smoker 02/04/2020   Liver metastases ([place]) 08/26/2019   Port-A-Cath in place 03/26/2019   Goals of care, counseling/discussion 02/25/2019   Carotid stenosis 01/07/2019   Cecal cancer ([place]) 07/26/2017   Adenocarcinoma of cecum ([place]) 06/21/2017   Iron deficiency anemia due to chronic blood loss 06/21/2017   DOE (dyspnea on exertion) 03/30/2015   Obesity (BMI 30-39.9) 03/30/2015   Trifascicular block    Nonspecific chest pain 02/24/2015   CAD (coronary artery disease) 08/13/2014   Essential hypertension 08/13/2014   Hyperlipidemia 08/13/2014   Pulmonary fibrosis ([place]) 08/13/2014   Sarcoidosis    Syncope 01/29/2012    Past Surgical History:  Procedure Laterality Date   BASAL CELL CARCINOMA EXCISION Left    shoulder  CARDIAC CATHETERIZATION N/A 02/24/2015   Procedure: Coronary/Bypass Graft CTO Intervention;  Surgeon: Jettie Booze, MD;  Location: [facility];  Service: Cardiovascular;  Laterality: N/A;   CARDIAC CATHETERIZATION  02/24/2015   Procedure: Coronary/Graft Atherectomy;  Surgeon: Jettie Booze, MD;  Location: [facility];  Service: Cardiovascular;;   CATARACT EXTRACTION W/ INTRAOCULAR LENS  IMPLANT, BILATERAL Bilateral ~ 2011-2012   COLONOSCOPY W/ POLYPECTOMY  06/08/2005 and 10/18/2010   8 mm adenoma 2006, none 2012. Diverticulosis and internal hemorrhoids.   CORNEAL TRANSPLANT Bilateral ~ 2011-2012   "@  same time as cataract OR"   CORONARY ANGIOPLASTY WITH STENT PLACEMENT  08/2014; 02/24/2015   "1 stent + 1 stent"   CORONARY STENT INTERVENTION N/A 01/02/2019   Procedure: CORONARY STENT INTERVENTION;  Surgeon: Jettie Booze, MD;  Location: [facility];  Service: Cardiovascular;  Laterality: N/A;   EYE SURGERY     FINGER SURGERY Right 2014   "reattached middle finger"   INSERTION PROSTATE RADIATION SEED  ~ 2011   IR IMAGING GUIDED PORT INSERTION  03/04/2019   IR RADIOLOGIST EVAL & MGMT  05/26/2020   IR US GUIDE BX ASP/DRAIN  03/04/2019   LAPAROSCOPIC CHOLECYSTECTOMY  2015   LAPAROSCOPIC PARTIAL COLECTOMY N/A 07/26/2017   Procedure: LAPAROSCOPIC PARTIAL COLECTOMY, EXCISION SCROTAL CYST;  Surgeon: Greer Pickerel, MD;  Location: [facility];  Service: General;  Laterality: N/A;   LEFT HEART CATH AND CORONARY ANGIOGRAPHY N/A 01/02/2019   Procedure: LEFT HEART CATH AND CORONARY ANGIOGRAPHY;  Surgeon: Jettie Booze, MD;  Location: [facility];  Service: Cardiovascular;  Laterality: N/A;   LEFT HEART CATHETERIZATION WITH CORONARY ANGIOGRAM N/A 08/12/2014   Procedure: LEFT HEART CATHETERIZATION WITH CORONARY ANGIOGRAM;  Surgeon: Blane Ohara, MD;  Location: [facility] C[facility] LAB;  Service: Cardiovascular;  Laterality: N/A;   LYMPH NODE BIOPSY     mid chest   MOHS SURGERY Right ~ 2011   "eyelid; for basal cell"   RADIOLOGY WITH ANESTHESIA N/A 06/30/2020   Procedure: IR WITH ANESTHESIA MICROWAVE ABLATION;  Surgeon: Corrie Mckusick, DO;  Location: [facility];  Service: Anesthesiology;  Laterality: N/A;   THOROCOTOMY WITH LOBECTOMY Right ~ 1991   partial removal right lung   TONSILLECTOMY  ~ 1950       Family History  Problem Relation Age of Onset   Heart disease Father 10       Died from "hardening of the arteries" age 32   Coronary artery disease Sister 3   Heart attack Sister    Colon cancer Neg Hx    Throat cancer Neg Hx    Pancreatic cancer Neg Hx    Prostate cancer Neg Hx    Stroke  Neg Hx    Hypertension Neg Hx     Social History   Tobacco Use   Smoking status: Light Smoker    Packs/day: 0.00    Years: 59.00    Pack years: 0.00    Types: Cigars, Cigarettes   Smokeless tobacco: Never   Tobacco comments:    02/24/2015 "quit cigarettes ~ 2000 ago but smokes cigars once/month"verified 10/07/20/jm  Vaping Use   Vaping Use: Never used  Substance Use Topics   Alcohol use: Yes    Alcohol/week: 2.0 standard drinks    Types: 1 Glasses of wine, 1 Standard drinks or equivalent per week    Comment: Drinks maybe 1-2 drinks a week or less   Drug use: No    Home Medications  Prior to Admission medications   Medication Sig Start Date End Date Taking? Authorizing Provider  albuterol (VENTOLIN HFA) 108 (90 Base) MCG/ACT inhaler Inhale 1-2 puffs into the lungs every 6 (six) hours as needed for wheezing or shortness of breath. 12/23/20   Sharion Balloon, NP  CANNABIDIOL PO Take 12 drops by mouth 2 (two) times a day. CBD OIL    [provider]  Cholecalciferol (VITAMIN D3) 2000 units capsule Take 2,000 Units by mouth every evening.     [provider]  clopidogrel (PLAVIX) 75 MG tablet Take 1 tablet (75 mg total) by mouth daily. Hold until catheter is removed by urology next week 07/29/20   Ardis Hughs, MD  dexamethasone (DECADRON) 4 MG tablet Take 2 tablets ('8mg'$  total) by mouth in the morning for 2 days after the first day of each cycle, then as directed. Patient not taking: No sig reported 07/28/20   [provider]  doxycycline (VIBRAMYCIN) 100 MG capsule Take 1 capsule (100 mg total) by mouth 2 (two) times daily. 12/23/20   Sharion Balloon, NP  fluticasone (FLONASE) 50 MCG/ACT nasal spray Place 1 spray into both nostrils daily. Patient not taking: No sig reported 08/02/20   Hunsucker, Bonna Gains, MD  fluticasone furoate-vilanterol (BREO ELLIPTA) 200-25 MCG/INH AEPB Inhale 1 puff into the lungs daily. 04/28/20   Hunsucker, Bonna Gains, MD  loratadine  (CLARITIN) 10 MG tablet Take 1 tablet (10 mg total) by mouth daily. 08/02/20   Hunsucker, Bonna Gains, MD  metroNIDAZOLE (METROCREAM) 0.75 % cream Apply as directed for rash starting the day before cycle 1 day 1. 07/28/20   [provider]  nitroGLYCERIN (NITROSTAT) 0.4 MG SL tablet PLACE 1 TABLET UNDER THE TONGUE EVERY 5 MINUTES AS NEEDED FOR CHEST PAIN FOR 3 DOSES 01/16/20   Jettie Booze, MD  Polyethylene Glycol 3350 (MIRALAX PO) Take 17 g by mouth daily as needed (constipation). Patient not taking: No sig reported    [provider]  prochlorperazine (COMPAZINE) 10 MG tablet Take 1 tablet (10 mg total) by mouth every 6 (six) hours as needed. Patient not taking: No sig reported 02/26/19   Ladell Pier, MD  ranolazine (RANEXA) 500 MG 12 hr tablet Take 1 tablet (500 mg total) by mouth 2 (two) times daily. 07/14/20   Jettie Booze, MD  rosuvastatin (CRESTOR) 20 MG tablet TAKE 1 TABLET BY MOUTH EVERY DAY 08/17/20   Jettie Booze, MD  traMADol (ULTRAM) 50 MG tablet Take 50-100 mg by mouth every 6 (six) hours as needed for moderate pain or severe pain.    [provider]    Allergies    Patient has no known allergies.  Review of Systems   Review of Systems  Constitutional:  Negative for chills, diaphoresis, fatigue and fever.  HENT:  Negative for congestion, sore throat and trouble swallowing.   Eyes:  Negative for pain and visual disturbance.  Respiratory:  Positive for shortness of breath. Negative for cough and wheezing.   Cardiovascular:  Positive for leg swelling. Negative for chest pain and palpitations.  Gastrointestinal:  Positive for abdominal distention. Negative for abdominal pain, diarrhea, nausea and vomiting.  Genitourinary:  Negative for difficulty urinating.  Musculoskeletal:  Negative for back pain, neck pain and neck stiffness.  Skin:  Negative for pallor.  Neurological:  Negative for dizziness, speech difficulty, weakness and  headaches.  Psychiatric/Behavioral:  Negative for confusion.    Physical Exam Updated Vital Signs BP (!) 175/76  Pulse 76   Temp 98.3 F (36.8 C) (Oral)   Resp 16   SpO2 97%   Physical Exam Constitutional:      General: He is not in acute distress.    Appearance: Normal appearance. He is not ill-appearing, toxic-appearing or diaphoretic.  HENT:     Mouth/Throat:     Mouth: Mucous membranes are moist.     Pharynx: Oropharynx is clear.  Eyes:     General: No scleral icterus.    Extraocular Movements: Extraocular movements intact.     Pupils: Pupils are equal, round, and reactive to light.  Cardiovascular:     Rate and Rhythm: Normal rate and regular rhythm.     Pulses: Normal pulses.     Heart sounds: Normal heart sounds.  Pulmonary:     Effort: Pulmonary effort is normal. No respiratory distress.     Breath sounds: Normal breath sounds. No stridor. No wheezing, rhonchi or rales.  Chest:     Chest wall: No tenderness.  Abdominal:     General: Abdomen is flat. There is no distension.     Palpations: Abdomen is soft.     Tenderness: There is no abdominal tenderness. There is no guarding or rebound.  Musculoskeletal:        General: No swelling or tenderness. Normal range of motion.     Cervical back: Normal range of motion and neck supple. No rigidity.     Right lower leg: Edema present.     Left lower leg: Edema present.     Comments: 1+ pitting edema bilaterally, DP pulses 2+.  No erythema or warmth.  Skin:    General: Skin is warm and dry.     Capillary Refill: Capillary refill takes less than 2 seconds.     Coloration: Skin is not pale.  Neurological:     General: No focal deficit present.     Mental Status: He is alert and oriented to person, place, and time.  Psychiatric:        Mood and Affect: Mood normal.        Behavior: Behavior normal.    ED Results / Procedures / Treatments   Labs (all labs ordered are listed, but only abnormal results are  displayed) Labs Reviewed  COMPREHENSIVE METABOLIC PANEL - Abnormal; Notable for the following components:      Result Value   Glucose, Bld 108 (*)    Calcium 8.6 (*)    AST 49 (*)    ALT 84 (*)    All other components within normal limits  CBC WITH DIFFERENTIAL/PLATELET - Abnormal; Notable for the following components:   RBC 2.95 (*)    Hemoglobin 8.8 (*)    HCT 28.5 (*)    RDW 16.7 (*)    Lymphs Abs 0.3 (*)    All other components within normal limits  D-DIMER, QUANTITATIVE - Abnormal; Notable for the following components:   D-Dimer, Quant 5.67 (*)    All other components within normal limits  RESP PANEL BY RT-PCR (FLU A&B, COVID) [id number]  BRAIN NATRIURETIC PEPTIDE  TROPONIN I (HIGH SENSITIVITY)  TROPONIN I (HIGH SENSITIVITY)    EKG None  Radiology DG Chest 2 View  Result Date: 01/12/2021 CLINICAL DATA:  sob, fluid overload EXAM: CHEST - 2 VIEW COMPARISON:  12/23/2020 FINDINGS: Stable cardiomediastinal contours. Atherosclerotic calcification of the aortic knob. Right-sided chest port remains in place. Moderate right-sided pleural effusion, increased from prior. Hazy right basilar opacity. Suture line noted within the right  lung base. No pneumothorax. IMPRESSION: Moderate right-sided pleural effusion, increased from prior. Electronically Signed   By: Davina Poke D.O.   On: 01/12/2021 14:03    Procedures Procedures   Medications Ordered in ED Medications  iohexol (OMNIPAQUE) 350 MG/ML injection 80 mL (has no administration in time range)  furosemide (LASIX) injection 20 mg (20 mg Intravenous Given 01/12/21 1458)    ED Course  I have reviewed the triage vital signs and the nursing notes.  Pertinent labs & imaging results that were available during my care of the patient were reviewed by me and considered in my medical decision making (see chart for details).    MDM Rules/Calculators/A&P                           STAFFORD CHRISTEL is a 80 y.o. male with pertinent past  medical history of CAD, colorectal cancer metastasized to the liver with recent diagnostic laparoscopically at [facility] last week.  Patient without any respiratory distress, appears well.  Patient is satting at 97% on room air.  Chest x-ray interpreted me does show pleural effusion, patient does appear slightly fluid overloaded as well.  Will obtain basic work-up, D-dimer since patient has not been on Plavix for a month consecutively, and BNP since patient is fluid overloaded.  We will also give short dose of Lasix here.  Work-up today significant for elevated D-dimer, patient will need to obtain CT PE study at this time.  First troponin negative at 10.  Pending second troponin.  Pt care was handed off to M. Venter PA-C at 330.  Complete history and physical and current plan have been communicated.  Please refer to their note for the remainder of ED care and ultimate disposition.  Dispo pending blood work and CT PE study.  Patient will need to be ambulated make sure he does not desat.  I discussed this case with my attending physician who cosigned this note including patient's presenting symptoms, physical exam, and planned diagnostics and interventions. Attending physician stated agreement with plan or made changes to plan which were implemented.   Attending physician assessed patient at bedside.  Final Clinical Impression(s) / ED Diagnoses Final diagnoses:  Pleural effusion    Rx / DC Orders ED Discharge Orders     None        Alfredia Client, PA-C 01/12/21 1529    Isla Pence, MD 01/12/21 1558

## 2021-01-12 NOTE — ED Notes (Signed)
Patient ambulated, maintained O2 sats between 95-100% with good waveform observed. Pt reported that he was breathing more easily than previously today.

## 2021-01-12 NOTE — Telephone Encounter (Signed)
Called and spoke to pt to inform him of the recs per Dr. Silas Flood. Pt states he feels his breathing has gotten worse since his d/c on 8/1. Pt states he has a cough with chest congestion. Pt states he isnt able to get much mucus up but when he does it is green in the center surrounded by clear mucus. Pt states he also feels his SOB has worsened since 8/1. Pt c/o BIL chest discomfort upon inspiration (mid chest, nipple line per pt). Pt denies f/c/s and wheezing. Pt is requesting recs from Dr. Silas Flood. There arent any openings this week for OV. Dr. Silas Flood is on nightfloat, will send to DOD.   Beth, please advise. Thanks.

## 2021-01-17 DIAGNOSIS — C787 Secondary malignant neoplasm of liver and intrahepatic bile duct: Secondary | ICD-10-CM | POA: Diagnosis not present

## 2021-01-17 DIAGNOSIS — C182 Malignant neoplasm of ascending colon: Secondary | ICD-10-CM | POA: Diagnosis not present

## 2021-01-20 ENCOUNTER — Other Ambulatory Visit: Payer: Self-pay | Admitting: Interventional Cardiology

## 2021-01-22 ENCOUNTER — Encounter (HOSPITAL_COMMUNITY): Payer: Self-pay | Admitting: Emergency Medicine

## 2021-01-22 ENCOUNTER — Emergency Department (HOSPITAL_COMMUNITY): Payer: Medicare PPO

## 2021-01-22 ENCOUNTER — Other Ambulatory Visit: Payer: Self-pay

## 2021-01-22 ENCOUNTER — Emergency Department (HOSPITAL_COMMUNITY)
Admission: EM | Admit: 2021-01-22 | Discharge: 2021-01-22 | Disposition: A | Discharge status: Home / Self Care | Payer: Medicare PPO | Attending: Emergency Medicine | Admitting: Emergency Medicine

## 2021-01-22 ENCOUNTER — Telehealth: Payer: Self-pay | Admitting: Internal Medicine

## 2021-01-22 DIAGNOSIS — J8410 Pulmonary fibrosis, unspecified: Secondary | ICD-10-CM | POA: Diagnosis not present

## 2021-01-22 DIAGNOSIS — R9431 Abnormal electrocardiogram [ECG] [EKG]: Secondary | ICD-10-CM | POA: Diagnosis not present

## 2021-01-22 DIAGNOSIS — J90 Pleural effusion, not elsewhere classified: Secondary | ICD-10-CM | POA: Diagnosis not present

## 2021-01-22 DIAGNOSIS — U071 COVID-19: Secondary | ICD-10-CM | POA: Insufficient documentation

## 2021-01-22 DIAGNOSIS — I10 Essential (primary) hypertension: Secondary | ICD-10-CM | POA: Insufficient documentation

## 2021-01-22 DIAGNOSIS — R0602 Shortness of breath: Secondary | ICD-10-CM

## 2021-01-22 DIAGNOSIS — C180 Malignant neoplasm of cecum: Secondary | ICD-10-CM | POA: Diagnosis not present

## 2021-01-22 DIAGNOSIS — Z79899 Other long term (current) drug therapy: Secondary | ICD-10-CM | POA: Insufficient documentation

## 2021-01-22 DIAGNOSIS — F17210 Nicotine dependence, cigarettes, uncomplicated: Secondary | ICD-10-CM | POA: Insufficient documentation

## 2021-01-22 LAB — CBC WITH DIFFERENTIAL/PLATELET
Abs Immature Granulocytes: 0.03 10*3/uL (ref 0.00–0.07)
Basophils Absolute: 0 10*3/uL (ref 0.0–0.1)
Basophils Relative: 0 %
Eosinophils Absolute: 0.2 10*3/uL (ref 0.0–0.5)
Eosinophils Relative: 3 %
HCT: 31.2 % — ABNORMAL LOW (ref 39.0–52.0)
Hemoglobin: 9.6 g/dL — ABNORMAL LOW (ref 13.0–17.0)
Immature Granulocytes: 1 %
Lymphocytes Relative: 12 %
Lymphs Abs: 0.5 10*3/uL — ABNORMAL LOW (ref 0.7–4.0)
MCH: 28.9 pg (ref 26.0–34.0)
MCHC: 30.8 g/dL (ref 30.0–36.0)
MCV: 94 fL (ref 80.0–100.0)
Monocytes Absolute: 0.6 10*3/uL (ref 0.1–1.0)
Monocytes Relative: 14 %
Neutro Abs: 3.2 10*3/uL (ref 1.7–7.7)
Neutrophils Relative %: 70 %
Platelets: 272 10*3/uL (ref 150–400)
RBC: 3.32 MIL/uL — ABNORMAL LOW (ref 4.22–5.81)
RDW: 16.6 % — ABNORMAL HIGH (ref 11.5–15.5)
WBC: 4.5 10*3/uL (ref 4.0–10.5)
nRBC: 0 % (ref 0.0–0.2)

## 2021-01-22 LAB — COMPREHENSIVE METABOLIC PANEL
ALT: 22 U/L (ref 0–44)
AST: 39 U/L (ref 15–41)
Albumin: 3.5 g/dL (ref 3.5–5.0)
Alkaline Phosphatase: 126 U/L (ref 38–126)
Anion gap: 8 (ref 5–15)
BUN: 6 mg/dL — ABNORMAL LOW (ref 8–23)
CO2: 24 mmol/L (ref 22–32)
Calcium: 8.8 mg/dL — ABNORMAL LOW (ref 8.9–10.3)
Chloride: 107 mmol/L (ref 98–111)
Creatinine, Ser: 0.79 mg/dL (ref 0.61–1.24)
GFR, Estimated: >60 mL/min (ref >60)
Glucose, Bld: 106 mg/dL — ABNORMAL HIGH (ref 70–99)
Potassium: 4.5 mmol/L (ref 3.5–5.1)
Sodium: 139 mmol/L (ref 135–145)
Total Bilirubin: 1 mg/dL (ref 0.3–1.2)
Total Protein: 6.9 g/dL (ref 6.5–8.1)

## 2021-01-22 LAB — RESP PANEL BY RT-PCR (FLU A&B, COVID) ARPGX2
Influenza A by PCR: NEGATIVE
Influenza B by PCR: NEGATIVE
SARS Coronavirus 2 by RT PCR: POSITIVE — AB

## 2021-01-22 LAB — TROPONIN I (HIGH SENSITIVITY): Troponin I (High Sensitivity): 8 ng/L (ref <18)

## 2021-01-22 LAB — D-DIMER, QUANTITATIVE: D-Dimer, Quant: 4.93 ug/mL-FEU — ABNORMAL HIGH (ref 0.00–0.50)

## 2021-01-22 LAB — LIPASE, BLOOD: Lipase: 27 U/L (ref 11–51)

## 2021-01-22 LAB — BRAIN NATRIURETIC PEPTIDE: B Natriuretic Peptide: 79.4 pg/mL (ref 0.0–100.0)

## 2021-01-22 MED ORDER — NIRMATRELVIR/RITONAVIR (PAXLOVID)TABLET
3.0000 | ORAL_TABLET | Freq: Two times a day (BID) | ORAL | Status: DC
  Administered 2021-01-22: 3 via ORAL
  Filled 2021-01-22: qty 30

## 2021-01-22 MED ORDER — IOHEXOL 350 MG/ML SOLN
80.0000 mL | Freq: Once | INTRAVENOUS | Status: AC | PRN
  Administered 2021-01-22: 80 mL via INTRAVENOUS

## 2021-01-22 MED ORDER — HEPARIN SOD (PORK) LOCK FLUSH 100 UNIT/ML IV SOLN
500.0000 [IU] | Freq: Once | INTRAVENOUS | Status: AC
  Administered 2021-01-22: 500 [IU]
  Filled 2021-01-22: qty 5

## 2021-01-22 MED ORDER — ALBUTEROL SULFATE HFA 108 (90 BASE) MCG/ACT IN AERS: 1.0000 | INHALATION_SPRAY | Freq: Four times a day (QID) | RESPIRATORY_TRACT | 0 refills | Status: DC | PRN

## 2021-01-22 NOTE — ED Provider Notes (Signed)
[facility] DEPT Provider Note   CSN: [id number] Arrival date & time: 01/22/21  1541     History Chief Complaint  Patient presents with   Shortness of Breath    Luis [name] is a 80 y.o. male.  Pt is a 80 yo male with pmh as listed below including metastatic colon cancer, HTN, hyperlipidemia, and multiple cardiac caths presenting for sob. Patient admits to sob that began this morning. Denies chest pain or wheezing. Denies fevers, chills nausea, vomiting, or diarrhea. Denies hx of DVT/PE. Denies orthopnea or lower leg swelling. Admits to dx of Covid 2 months ago.  The history is provided by the patient. No language interpreter was used.  Shortness of Breath Severity:  Moderate Onset quality:  Gradual Duration:  12 hours Timing:  Constant Progression:  Worsening Chronicity:  New Associated symptoms: no abdominal pain, no chest pain, no cough, no ear pain, no fever, no rash, no sore throat and no vomiting       Past Medical History:  Diagnosis Date   Adenocarcinoma of cecum ([place]) 06/21/2017   Arthritis    "mid back; hands; knees" (02/24/2015)   Basal cell carcinoma    left shoulder; mid chest; right eyelid (02/24/2015)   CAD (coronary artery disease)    a. 08/2014 Cath/PCI: LM nl, LAD 30p, D1 95 (2.25x12 Resolute Integrity DES), LCX small, RI 100 (attempted PCI) - branches fill via L->L collats, RCA dominant, nl, RPDA/PLA nl, EF 60^. b. 02/24/2015 PCI CTO of Ramus DES x2.   Chronic bronchitis ([place])    hx   Diverticulosis 05/2005   Elevated lipase    Fuchs' corneal dystrophy    History of adenomatous polyp of colon 05/2005   8 mm adenoma   History of blood transfusion    "related to some of my surgeries"   Hyperlipidemia    Hypertension    Iron deficiency anemia due to chronic blood loss 06/21/2017   Pneumonia    Prostate cancer ([place]) 2011   S/P seed implant   Sarcoidosis    Thrombocytopenia ([place])    a. Noted on prior labs, unclear of  what w/u done.   Trifascicular block     Patient Active Problem List   Diagnosis Date Noted   Hematuria 07/28/2020   Adenocarcinoma ([place])    Smoker 02/04/2020   Liver metastases ([place]) 08/26/2019   Port-A-Cath in place 03/26/2019   Goals of care, counseling/discussion 02/25/2019   Carotid stenosis 01/07/2019   Cecal cancer ([place]) 07/26/2017   Adenocarcinoma of cecum ([place]) 06/21/2017   Iron deficiency anemia due to chronic blood loss 06/21/2017   DOE (dyspnea on exertion) 03/30/2015   Obesity (BMI 30-39.9) 03/30/2015   Trifascicular block    Nonspecific chest pain 02/24/2015   CAD (coronary artery disease) 08/13/2014   Essential hypertension 08/13/2014   Hyperlipidemia 08/13/2014   Pulmonary fibrosis ([place]) 08/13/2014   Sarcoidosis    Syncope 01/29/2012    Past Surgical History:  Procedure Laterality Date   BASAL CELL CARCINOMA EXCISION Left    shoulder   CARDIAC CATHETERIZATION N/A 02/24/2015   Procedure: Coronary/Bypass Graft CTO Intervention;  Surgeon: Jettie Booze, MD;  Location: [facility];  Service: Cardiovascular;  Laterality: N/A;   CARDIAC CATHETERIZATION  02/24/2015   Procedure: Coronary/Graft Atherectomy;  Surgeon: Jettie Booze, MD;  Location: [facility];  Service: Cardiovascular;;   CATARACT EXTRACTION W/ INTRAOCULAR LENS  IMPLANT, BILATERAL Bilateral ~ 2011-2012   COLONOSCOPY W/ POLYPECTOMY  06/08/2005 and 10/18/2010   8 mm adenoma 2006, none 2012. Diverticulosis and internal hemorrhoids.   CORNEAL TRANSPLANT Bilateral ~ 2011-2012   "@ same time as cataract OR"   CORONARY ANGIOPLASTY WITH STENT PLACEMENT  08/2014; 02/24/2015   "1 stent + 1 stent"   CORONARY STENT INTERVENTION N/A 01/02/2019   Procedure: CORONARY STENT INTERVENTION;  Surgeon: Jettie Booze, MD;  Location: [facility];  Service: Cardiovascular;  Laterality: N/A;   EYE SURGERY     FINGER SURGERY Right 2014   "reattached middle finger"   INSERTION PROSTATE  RADIATION SEED  ~ 2011   IR IMAGING GUIDED PORT INSERTION  03/04/2019   IR RADIOLOGIST EVAL & MGMT  05/26/2020   IR US GUIDE BX ASP/DRAIN  03/04/2019   LAPAROSCOPIC CHOLECYSTECTOMY  2015   LAPAROSCOPIC PARTIAL COLECTOMY N/A 07/26/2017   Procedure: LAPAROSCOPIC PARTIAL COLECTOMY, EXCISION SCROTAL CYST;  Surgeon: Greer Pickerel, MD;  Location: [facility];  Service: General;  Laterality: N/A;   LEFT HEART CATH AND CORONARY ANGIOGRAPHY N/A 01/02/2019   Procedure: LEFT HEART CATH AND CORONARY ANGIOGRAPHY;  Surgeon: Jettie Booze, MD;  Location: [facility];  Service: Cardiovascular;  Laterality: N/A;   LEFT HEART CATHETERIZATION WITH CORONARY ANGIOGRAM N/A 08/12/2014   Procedure: LEFT HEART CATHETERIZATION WITH CORONARY ANGIOGRAM;  Surgeon: Blane Ohara, MD;  Location: [facility];  Service: Cardiovascular;  Laterality: N/A;   LYMPH NODE BIOPSY     mid chest   MOHS SURGERY Right ~ 2011   "eyelid; for basal cell"   RADIOLOGY WITH ANESTHESIA N/A 06/30/2020   Procedure: IR WITH ANESTHESIA MICROWAVE ABLATION;  Surgeon: Corrie Mckusick, DO;  Location: [facility];  Service: Anesthesiology;  Laterality: N/A;   THOROCOTOMY WITH LOBECTOMY Right ~ 1991   partial removal right lung   TONSILLECTOMY  ~ 1950       Family History  Problem Relation Age of Onset   Heart disease Father 77       Died from "hardening of the arteries" age 63   Coronary artery disease Sister 44   Heart attack Sister    Colon cancer Neg Hx    Throat cancer Neg Hx    Pancreatic cancer Neg Hx    Prostate cancer Neg Hx    Stroke Neg Hx    Hypertension Neg Hx     Social History   Tobacco Use   Smoking status: Light Smoker    Packs/day: 0.00    Years: 59.00    Pack years: 0.00    Types: Cigars, Cigarettes   Smokeless tobacco: Never   Tobacco comments:    02/24/2015 "quit cigarettes ~ 2000 ago but smokes cigars once/month"verified 10/07/20/jm  Vaping Use   Vaping Use: Never used  Substance Use Topics   Alcohol use:  Yes    Alcohol/week: 2.0 standard drinks    Types: 1 Glasses of wine, 1 Standard drinks or equivalent per week    Comment: Drinks maybe 1-2 drinks a week or less   Drug use: No    Home Medications Prior to Admission medications   Medication Sig Start Date End Date Taking? Authorizing Provider  albuterol (VENTOLIN HFA) 108 (90 Base) MCG/ACT inhaler Inhale 1-2 puffs into the lungs every 6 (six) hours as needed for wheezing or shortness of breath. 12/23/20   Sharion Balloon, NP  CANNABIDIOL PO Take 12 drops by mouth 2 (two) times a day. CBD OIL    [provider]  Cholecalciferol (VITAMIN D3)  2000 units capsule Take 2,000 Units by mouth every evening.     [provider]  clopidogrel (PLAVIX) 75 MG tablet Take 1 tablet (75 mg total) by mouth daily. Hold until catheter is removed by urology next week 07/29/20   Ardis Hughs, MD  dexamethasone (DECADRON) 4 MG tablet Take 2 tablets ('8mg'$  total) by mouth in the morning for 2 days after the first day of each cycle, then as directed. Patient not taking: No sig reported 07/28/20   [provider]  doxycycline (VIBRAMYCIN) 100 MG capsule Take 1 capsule (100 mg total) by mouth 2 (two) times daily. 12/23/20   Sharion Balloon, NP  fluticasone (FLONASE) 50 MCG/ACT nasal spray Place 1 spray into both nostrils daily. Patient not taking: No sig reported 08/02/20   Hunsucker, Bonna Gains, MD  fluticasone furoate-vilanterol (BREO ELLIPTA) 200-25 MCG/INH AEPB Inhale 1 puff into the lungs daily. 04/28/20   Hunsucker, Bonna Gains, MD  furosemide (LASIX) 20 MG tablet Take 1 tablet (20 mg total) by mouth daily for 5 days. 01/12/21 01/17/21  Eustaquio Maize, PA-C  loratadine (CLARITIN) 10 MG tablet Take 1 tablet (10 mg total) by mouth daily. 08/02/20   Hunsucker, Bonna Gains, MD  metroNIDAZOLE (METROCREAM) 0.75 % cream Apply as directed for rash starting the day before cycle 1 day 1. 07/28/20   [provider]  nitroGLYCERIN (NITROSTAT) 0.4 MG SL  tablet PLACE 1 TABLET UNDER THE TONGUE EVERY 5 MINUTES AS NEEDED FOR CHEST PAIN FOR 3 DOSES 01/21/21   Jettie Booze, MD  Polyethylene Glycol 3350 (MIRALAX PO) Take 17 g by mouth daily as needed (constipation). Patient not taking: No sig reported    [provider]  prochlorperazine (COMPAZINE) 10 MG tablet Take 1 tablet (10 mg total) by mouth every 6 (six) hours as needed. Patient not taking: No sig reported 02/26/19   Ladell Pier, MD  ranolazine (RANEXA) 500 MG 12 hr tablet Take 1 tablet (500 mg total) by mouth 2 (two) times daily. 07/14/20   Jettie Booze, MD  rosuvastatin (CRESTOR) 20 MG tablet TAKE 1 TABLET BY MOUTH EVERY DAY 08/17/20   Jettie Booze, MD  traMADol (ULTRAM) 50 MG tablet Take 50-100 mg by mouth every 6 (six) hours as needed for moderate pain or severe pain.    [provider]    Allergies    Patient has no known allergies.  Review of Systems   Review of Systems  Constitutional:  Negative for chills and fever.  HENT:  Negative for ear pain and sore throat.   Eyes:  Negative for pain and visual disturbance.  Respiratory:  Positive for shortness of breath. Negative for cough.   Cardiovascular:  Negative for chest pain and palpitations.  Gastrointestinal:  Negative for abdominal pain and vomiting.  Genitourinary:  Negative for dysuria and hematuria.  Musculoskeletal:  Negative for arthralgias and back pain.  Skin:  Negative for color change and rash.  Neurological:  Negative for seizures and syncope.  All other systems reviewed and are negative.  Physical Exam Updated Vital Signs BP (!) 155/85 (BP Location: Right Arm)   Pulse 84   Temp 98.5 F (36.9 C) (Oral)   Resp 20   Ht '5\' 10"'$  (1.778 m)   Wt 104.3 kg   SpO2 99%   BMI 33.00 kg/m   Physical Exam Vitals and nursing note reviewed.  Constitutional:      Appearance: He is well-developed.  HENT:     Head:  Normocephalic and atraumatic.  Eyes:     Conjunctiva/sclera:  Conjunctivae normal.  Cardiovascular:     Rate and Rhythm: Normal rate and regular rhythm.     Heart sounds: No murmur heard. Pulmonary:     Effort: Pulmonary effort is normal. No respiratory distress.     Breath sounds: Normal breath sounds.  Abdominal:     Palpations: Abdomen is soft.     Tenderness: There is no abdominal tenderness.  Musculoskeletal:     Cervical back: Neck supple.  Skin:    General: Skin is warm and dry.  Neurological:     Mental Status: He is alert.    ED Results / Procedures / Treatments   Labs (all labs ordered are listed, but only abnormal results are displayed) Labs Reviewed  COMPREHENSIVE METABOLIC PANEL  CBC WITH DIFFERENTIAL/PLATELET  BRAIN NATRIURETIC PEPTIDE  LIPASE, BLOOD  TROPONIN I (HIGH SENSITIVITY)    EKG None  Radiology No results found.  Procedures Procedures   Medications Ordered in ED Medications - No data to display  ED Course  I have reviewed the triage vital signs and the nursing notes.  Pertinent labs & imaging results that were available during my care of the patient were reviewed by me and considered in my medical decision making (see chart for details).    MDM Rules/Calculators/A&P   4:31 PM 79 yo male with pmh as listed below including HTN, hyperlipidemia, and multiple cardiac caths presenting for sob. Patient is Aox3, no acute distress, afebrile, with stable vitals. Physical exam demonstrates equal bilateral breath sound with no adventitious lung sounds. No lower extremity edema.    Of note, patient states he had symptoms of SOB without chest pain with previous coronary blockages including LAD occlusion.  The patient's chest pain is not suggestive of pulmonary embolus, cardiac ischemia, aortic dissection, pericarditis, myocarditis, pulmonary embolism, pneumothorax, pneumonia, Zoster, or esophageal perforation, or other serious etiology.  Historically not abrupt in onset, tearing or ripping, pulses symmetric. EKG  nonspecific for ischemia/infarction. No dysrhythmias, brugada, WPW, prolonged QT noted. CXR reviewed and WNL. Troponin negative x2. Labs without demonstration of acute pathology unless otherwise noted above. CT PE demonstrates no PE.  Patient offered admission for further cardiac evaluation including stress test due to high risk Heart Score but declined.  Symptoms possibly related to Covid dx 2 months ago. No hypoxia on exam. Detailed discussions were had with the patient regarding current findings, and need for close f/u with cardiologist or on call doctor. The patient has been instructed to return immediately if the symptoms worsen in any way for re-evaluation. Patient verbalized understanding and is in agreement with current care plan. All questions answered prior to discharge.  Final Clinical Impression(s) / ED Diagnoses Final diagnoses:  COVID  SOB (shortness of breath)    Rx / DC Orders ED Discharge Orders     None        Lianne Cure, DO [id number]

## 2021-01-22 NOTE — ED Provider Notes (Signed)
Emergency Medicine Provider Triage Evaluation Note  Luis [name] , a 80 y.o. male  was evaluated in triage.  Pt complains of shortness of breath.  Patient states that he has a history of metastatic colon cancer to the liver.  Recently had laparoscopy on July 28.  Began developing shortness of breath after the surgery and was evaluated on August 3 in the emergency department.  Patient had a CT of the chest with findings as noted below:  IMPRESSION: 1. Limited evaluation of the pulmonary vasculature due to suboptimal contrast bolus. No central or proximal segmental pulmonary emboli. 2. Small right pleural effusion, partially loculated at the right apex, volume estimated less than 1 L. 3. Chronic right upper lobe consolidation likely scarring. Compressive atelectasis right lower lobe. 4. Enlarging liver hypodensity consistent with metastatic disease. 5. Postsurgical changes central upper abdomen, with likely postoperative seroma along the inferior margin left lobe liver. 6.  Aortic Atherosclerosis (ICD10-I70.0).  Patient states that he was given Lasix at this visit which significantly improved his symptoms.  He followed up with his PCP who prescribed p.o. Lasix and he took his first dose earlier today but also notes waking up today with worsening shortness of breath, so he came to the emergency department for further evaluation.  Reports associated central chest tightness.  Also complains of mild epigastric pain.  No vomiting.  Patient states he has a pump in his left abdomen for chemotherapy but his chemotherapy was discontinued about 2 weeks prior to his procedure.  Physical Exam  BP (!) 155/85 (BP Location: Right Arm)   Pulse 84   Temp 98.5 F (36.9 C) (Oral)   Resp 20   Ht '5\' 10"'$  (1.778 m)   Wt 104.3 kg   SpO2 99%   BMI 33.00 kg/m  Gen:   Awake, no distress   Resp:  Normal effort  MSK:   Moves extremities without difficulty  Other:    Medical Decision Making  Medically  screening exam initiated at 4:01 PM.  Appropriate orders placed.  STEEL VAID was informed that the remainder of the evaluation will be completed by another provider, this initial triage assessment does not replace that evaluation, and the importance of remaining in the ED until their evaluation is complete.   Rayna Sexton, PA-C [id number]    Gray, Guy P, DO [id number]

## 2021-01-22 NOTE — ED Notes (Signed)
Patient received first dose of Paxlovid before discharge. Patient given the remainder of the box with instructions on taking this medication 2x a day until completed.

## 2021-01-22 NOTE — ED Triage Notes (Signed)
Pt recently had surgery to remove liver CA and was seen for small pleural effusion here after SOB. SOB got better after lasix but feels worse today. Alert and oriented.

## 2021-01-22 NOTE — ED Notes (Signed)
Patient with HOB elevated.  No distress noted when checked frequent.  Cardiac monitor in place

## 2021-01-22 NOTE — Telephone Encounter (Signed)
Patient calls about having chest discomfort at home.  Reported history of 3 prior stents.  Also reported high risk tobacco use.  Given his history and symptoms recommended going to the ER for further evaluation.

## 2021-01-23 NOTE — Telephone Encounter (Signed)
Negative w/u in ER.  COntinue to monitor sx.

## 2021-01-24 ENCOUNTER — Telehealth: Payer: Self-pay | Admitting: *Deleted

## 2021-01-24 DIAGNOSIS — C189 Malignant neoplasm of colon, unspecified: Secondary | ICD-10-CM | POA: Diagnosis not present

## 2021-01-24 DIAGNOSIS — Z79899 Other long term (current) drug therapy: Secondary | ICD-10-CM | POA: Diagnosis not present

## 2021-01-24 DIAGNOSIS — Z9089 Acquired absence of other organs: Secondary | ICD-10-CM | POA: Diagnosis not present

## 2021-01-24 DIAGNOSIS — C787 Secondary malignant neoplasm of liver and intrahepatic bile duct: Secondary | ICD-10-CM | POA: Diagnosis not present

## 2021-01-24 NOTE — Telephone Encounter (Signed)
Called patient to f/u on how surgery went on 7/20 w/Dr. Fayrene Helper at [facility]. He thinks it went well and is seeing him today for f/u. Diagnosed with covid on 8/13 and is taking Paxlovid now.  Would like to have his port flushed in 6 weeks locally. Asking if we would be able to flush his HAI pump--informed him this discussion needs to occur with Dr. Benay Spice and Dr. Fayrene Helper and that we are only flushing the [employer] pump here w/heparin. Scheduling message sent for 6 week port flush and OV w/Dr. Benay Spice.

## 2021-01-24 NOTE — Telephone Encounter (Signed)
I spoke with patient and gave him information from Dr Irish Lack.  He is not having chest pain. Reports he has been having shortness of breath for a long time and it is a little better.  He is seeing pulmonary next month.  He will let us know if he develops chest pain or worsening shortness of breath and stress test can be ordered at that time.

## 2021-01-24 NOTE — Telephone Encounter (Signed)
OK to order stress test if symptoms are persistent, as last stress test was in 2019.

## 2021-01-24 NOTE — Telephone Encounter (Signed)
I spoke with patient to follow up.  He is not having chest pain and reports shortness of breath is better.  States he is seeing pulmonary. Patient reports he was told in ED to call Dr Irish Lack to see if stress test needed.  I told patient I would send message to Dr Irish Lack and I would call him back if stress test recommended.

## 2021-01-25 ENCOUNTER — Telehealth: Payer: Self-pay | Admitting: Oncology

## 2021-01-25 NOTE — Telephone Encounter (Signed)
Called and left a detailed message regarding appointments scheduled per 8/15 sch msg

## 2021-01-27 DIAGNOSIS — D860 Sarcoidosis of lung: Secondary | ICD-10-CM | POA: Diagnosis not present

## 2021-01-27 DIAGNOSIS — E669 Obesity, unspecified: Secondary | ICD-10-CM | POA: Diagnosis not present

## 2021-01-27 DIAGNOSIS — E785 Hyperlipidemia, unspecified: Secondary | ICD-10-CM | POA: Diagnosis not present

## 2021-01-27 DIAGNOSIS — C189 Malignant neoplasm of colon, unspecified: Secondary | ICD-10-CM | POA: Diagnosis not present

## 2021-01-27 DIAGNOSIS — K219 Gastro-esophageal reflux disease without esophagitis: Secondary | ICD-10-CM | POA: Diagnosis not present

## 2021-01-27 DIAGNOSIS — C787 Secondary malignant neoplasm of liver and intrahepatic bile duct: Secondary | ICD-10-CM | POA: Diagnosis not present

## 2021-01-27 DIAGNOSIS — F17210 Nicotine dependence, cigarettes, uncomplicated: Secondary | ICD-10-CM | POA: Diagnosis not present

## 2021-01-27 DIAGNOSIS — R030 Elevated blood-pressure reading, without diagnosis of hypertension: Secondary | ICD-10-CM | POA: Diagnosis not present

## 2021-01-27 DIAGNOSIS — I2510 Atherosclerotic heart disease of native coronary artery without angina pectoris: Secondary | ICD-10-CM | POA: Diagnosis not present

## 2021-02-01 NOTE — Progress Notes (Signed)
Cardiology Office Note   Date:  02/02/2021   ID:  Luis [name], DOB Jan 24, 1941, MRN [id number]  PCP:  [name], MD    No chief complaint on file.  CAD  Wt Readings from Last 3 Encounters:  02/02/21 228 lb 3.2 oz (103.5 kg)  01/22/21 230 lb (104.3 kg)  11/01/20 238 lb 3.2 oz (108 kg)       History of Present Illness: Luis [name] is a 80 y.o. male  with history of CAD status post DES to the diagonal 08/2014 ramus intermedius was totally occluded at the time, eventually underwent CTO PCI with overlapping DES x2 to the ramus 02/2015.  Chest pain 05/2016 relieved with nitroglycerin and treated with Ranexa but has been off.  NST 12/2017 normal LVEF normal study.  Found to have a totally occluded right carotid on Dopplers 04/2018 but other vessels open. HTN, HLD, Also has adenocarcinoma of the colon status post colectomy.   Patient added to office schedule on 12/25/18 for increased dyspnea on exertion and left arm pain relieved with rest. Was walking 3 miles daily but had to cut back to 1 1/2 miles daily because of shortness of breath.  No chest tightness. Similar to when he had his stents placed. Didn't have chest tightness then. Had gained 10-12 lbs. Given his ongoing symptoms he was referred for outpatient cardiac cath.    Patient underwent cardiac cath 01/02/2019 with DES to the LAD and balloon angioplasty to the diagonal bifurcation.  Plan for aspirin and Plavix for at least 6 months.   He had liver biopsy.He has had some CP with eating certin foods and with exertion but improved with NTG and amlodipine.  Sx resolved.   He got chemotherapy for adenoCA of colon with liver Mets, then Surgery planned once tumor had shrunk.  He decided not to have surgery, so he was treated with chemotherapy and will have an ablation of the tumor on the liver.    Had a recent episode of chest pain with negative w/u in ER.    Pain was different from his prior angina.  Troponin was negative.   ECG was unchanged.  He was seen by Dr. Martinique and sent home.  Plan was to consider repeat heart cath if symptoms persisted.    Walking was limited by spinal stenosis.  Breathing was better with Breo.   Hsa been treated for colon cancer with liver mets: "Procedure: 07/23/2020 Diagnostic laparoscopy, robot-assisted insertion of hepatic artery infusion pump (Intera 3000, 30 mL reservoir, flow rate 1.3 ml/day), portal lymphadenectomy, core tumor biopsy OPERATIVE FINDINGS: No extrahepatic disease. Bilobar liver metastases. Steatotic liver. Completely replaced common hepatic artery off SMA. Bilobar hepatic perfusion without extrahepatic perfusion when pump bolused with blue dye.  Procedure: 01/06/2021 Diagnostic laparoscopy, robot-assisted partial hepatectomy (segment 2, segment 6), microwave ablation (segment 7), intraoperative ultrasound OPERATIVE FINDINGS: No extrahepatic disease. Three tumors identified in the liver in segments 2, 6 and 7, consistent with preoperative imaging. Segment 2 and 6 tumors resected with grossly negative margins. Segment 6 resection bed with small bile leak, oversewn and clipped (drain left in place). Segment 7 tumor ablated under ultrasound and navigation guidance. Liver steatotic. Pathology: segment 2 and segment 6 resections c/w adenocarcinoma of colorectal primary, negative margins"  He had some more chest pain and went to ER with negative w/u in 01/2021.  Hbg was 9.6.  Noted at [facility] that he had to use additional diuretics for pleural effusions and shortness of  breath.  Chest x-ray on January 22, 2021 showed significant improvement in the right-sided pleural effusion.  He is increasing walking and had some DOE.  Even with small activities, he is feeling some shortness of breath.  Responds well to Lasix.  Denies : Chest pain. Dizziness. Leg edema. Nitroglycerin use. Orthopnea. Palpitations. Paroxysmal nocturnal dyspnea. Syncope.    Past Medical History:  Diagnosis Date    Adenocarcinoma of cecum ([place]) 06/21/2017   Arthritis    "mid back; hands; knees" (02/24/2015)   Basal cell carcinoma    left shoulder; mid chest; right eyelid (02/24/2015)   CAD (coronary artery disease)    a. 08/2014 Cath/PCI: LM nl, LAD 30p, D1 95 (2.25x12 Resolute Integrity DES), LCX small, RI 100 (attempted PCI) - branches fill via L->L collats, RCA dominant, nl, RPDA/PLA nl, EF 60^. b. 02/24/2015 PCI CTO of Ramus DES x2.   Chronic bronchitis ([place])    hx   Diverticulosis 05/2005   Elevated lipase    Fuchs' corneal dystrophy    History of adenomatous polyp of colon 05/2005   8 mm adenoma   History of blood transfusion    "related to some of my surgeries"   Hyperlipidemia    Hypertension    Iron deficiency anemia due to chronic blood loss 06/21/2017   Pneumonia    Prostate cancer ([place]) 2011   S/P seed implant   Sarcoidosis    Thrombocytopenia ([place])    a. Noted on prior labs, unclear of what w/u done.   Trifascicular block     Past Surgical History:  Procedure Laterality Date   BASAL CELL CARCINOMA EXCISION Left    shoulder   CARDIAC CATHETERIZATION N/A 02/24/2015   Procedure: Coronary/Bypass Graft CTO Intervention;  Surgeon: Jettie Booze, MD;  Location: [facility];  Service: Cardiovascular;  Laterality: N/A;   CARDIAC CATHETERIZATION  02/24/2015   Procedure: Coronary/Graft Atherectomy;  Surgeon: Jettie Booze, MD;  Location: [facility];  Service: Cardiovascular;;   CATARACT EXTRACTION W/ INTRAOCULAR LENS  IMPLANT, BILATERAL Bilateral ~ 2011-2012   COLONOSCOPY W/ POLYPECTOMY  06/08/2005 and 10/18/2010   8 mm adenoma 2006, none 2012. Diverticulosis and internal hemorrhoids.   CORNEAL TRANSPLANT Bilateral ~ 2011-2012   "@ same time as cataract OR"   CORONARY ANGIOPLASTY WITH STENT PLACEMENT  08/2014; 02/24/2015   "1 stent + 1 stent"   CORONARY STENT INTERVENTION N/A 01/02/2019   Procedure: CORONARY STENT INTERVENTION;  Surgeon: Jettie Booze, MD;   Location: [facility];  Service: Cardiovascular;  Laterality: N/A;   EYE SURGERY     FINGER SURGERY Right 2014   "reattached middle finger"   INSERTION PROSTATE RADIATION SEED  ~ 2011   IR IMAGING GUIDED PORT INSERTION  03/04/2019   IR RADIOLOGIST EVAL & MGMT  05/26/2020   IR US GUIDE BX ASP/DRAIN  03/04/2019   LAPAROSCOPIC CHOLECYSTECTOMY  2015   LAPAROSCOPIC PARTIAL COLECTOMY N/A 07/26/2017   Procedure: LAPAROSCOPIC PARTIAL COLECTOMY, EXCISION SCROTAL CYST;  Surgeon: Greer Pickerel, MD;  Location: [facility];  Service: General;  Laterality: N/A;   LEFT HEART CATH AND CORONARY ANGIOGRAPHY N/A 01/02/2019   Procedure: LEFT HEART CATH AND CORONARY ANGIOGRAPHY;  Surgeon: Jettie Booze, MD;  Location: [facility];  Service: Cardiovascular;  Laterality: N/A;   LEFT HEART CATHETERIZATION WITH CORONARY ANGIOGRAM N/A 08/12/2014   Procedure: LEFT HEART CATHETERIZATION WITH CORONARY ANGIOGRAM;  Surgeon: Blane Ohara, MD;  Location: [facility];  Service: Cardiovascular;  Laterality: N/A;   LYMPH NODE BIOPSY     mid chest   MOHS SURGERY Right ~ 2011   "eyelid; for basal cell"   RADIOLOGY WITH ANESTHESIA N/A 06/30/2020   Procedure: IR WITH ANESTHESIA MICROWAVE ABLATION;  Surgeon: Corrie Mckusick, DO;  Location: [facility];  Service: Anesthesiology;  Laterality: N/A;   THOROCOTOMY WITH LOBECTOMY Right ~ 1991   partial removal right lung   TONSILLECTOMY  ~ 1950     Current Outpatient Medications  Medication Sig Dispense Refill   albuterol (VENTOLIN HFA) 108 (90 Base) MCG/ACT inhaler Inhale 1-2 puffs into the lungs every 6 (six) hours as needed for wheezing or shortness of breath. 1 each 0   CANNABIDIOL PO Take 12 drops by mouth 2 (two) times a day. CBD OIL     Cholecalciferol (VITAMIN D3) 2000 units capsule Take 2,000 Units by mouth every evening.      clobetasol (TEMOVATE) 0.05 % external solution Apply topically 2 (two) times daily.     clotrimazole (LOTRIMIN) 1 % cream Apply topically 2  (two) times daily.     dexamethasone (DECADRON) 4 MG tablet Take 2 tablets ('8mg'$  total) by mouth in the morning for 2 days after the first day of each cycle, then as directed.     enoxaparin (LOVENOX) 40 MG/0.4ML injection Inject into the skin.     fluticasone (FLONASE) 50 MCG/ACT nasal spray Place 1 spray into both nostrils daily. 16 g 2   fluticasone furoate-vilanterol (BREO ELLIPTA) 200-25 MCG/INH AEPB Inhale 1 puff into the lungs daily. 180 each 11   loratadine (CLARITIN) 10 MG tablet Take 1 tablet (10 mg total) by mouth daily. 30 tablet 11   magnesium oxide (MAG-OX) 400 MG tablet Take 400 mg by mouth as needed.     metroNIDAZOLE (METROCREAM) 0.75 % cream Apply as directed for rash starting the day before cycle 1 day 1.     naproxen sodium (ALEVE) 220 MG tablet Take by mouth.     nitroGLYCERIN (NITROSTAT) 0.4 MG SL tablet PLACE 1 TABLET UNDER THE TONGUE EVERY 5 MINUTES AS NEEDED FOR CHEST PAIN FOR 3 DOSES 25 tablet 3   Polyethylene Glycol 3350 (MIRALAX PO) Take 17 g by mouth daily as needed (constipation).     prochlorperazine (COMPAZINE) 10 MG tablet Take 1 tablet (10 mg total) by mouth every 6 (six) hours as needed. 60 tablet 1   ranolazine (RANEXA) 500 MG 12 hr tablet Take 1 tablet (500 mg total) by mouth 2 (two) times daily. 180 tablet 3   rosuvastatin (CRESTOR) 20 MG tablet TAKE 1 TABLET BY MOUTH EVERY DAY 90 tablet 2   traMADol (ULTRAM) 50 MG tablet Take 50-100 mg by mouth every 6 (six) hours as needed for moderate pain or severe pain.     clopidogrel (PLAVIX) 75 MG tablet Take 1 tablet (75 mg total) by mouth daily. 90 tablet 3   furosemide (LASIX) 20 MG tablet Take 1 tablet (20 mg total) by mouth daily for 5 days. 5 tablet 0   No current facility-administered medications for this visit.    Allergies:   Patient has no known allergies.    Social History:  The patient  reports that he has been smoking cigars and cigarettes. He has never used smokeless tobacco. He reports current  alcohol use of about 2.0 standard drinks per week. He reports that he does not use drugs.   Family History:  The patient's family history includes Coronary artery disease (age of onset: 57)  in his sister; Heart attack in his sister; Heart disease (age of onset: 79) in his father.    ROS:  Please see the history of present illness.   Otherwise, review of systems are positive for DOE.   All other systems are reviewed and negative.    PHYSICAL EXAM: VS:  BP 138/70   Pulse 79   Ht '5\' 10"'$  (1.778 m)   Wt 228 lb 3.2 oz (103.5 kg)   SpO2 97%   BMI 32.74 kg/m  , BMI Body mass index is 32.74 kg/m. GEN: Well nourished, well developed, in no acute distress HEENT: normal Neck: no JVD, carotid bruits, or masses Cardiac: RRR; no murmurs, rubs, or gallops,no edema  Respiratory:  clear to auscultation bilaterally, normal work of breathing GI: soft, nontender, nondistended, + BS, obese MS: no deformity or atrophy Skin: warm and dry, no rash Neuro:  Strength and sensation are intact Psych: euthymic mood, full affect   EKG:   The ekg ordered today demonstrates NSR, prolonged PR interval, no ST changes   Recent Labs: 06/07/2020: Magnesium 1.8 01/22/2021: ALT 22; B Natriuretic Peptide 79.4; BUN 6; Creatinine, Ser 0.79; Hemoglobin 9.6; Platelets 272; Potassium 4.5; Sodium 139   Lipid Panel    Component Value Date/Time   CHOL 155 10/13/2015 1005   TRIG 186 (H) 10/13/2015 1005   HDL 49 10/13/2015 1005   CHOLHDL 3.2 10/13/2015 1005   VLDL 37 (H) 10/13/2015 1005   LDLCALC 69 10/13/2015 1005     Other studies Reviewed: Additional studies/ records that were reviewed today with results demonstrating: Records from ER and [facility] reviewed.     ASSESSMENT AND PLAN:  CAD: Anginal symptoms earlier this month, shortly after his liver resection surgery.  At the time, he had a resolving pleural effusion.  He has been noted to have a hemoglobin below 9 and subsequently below 10 earlier in the month.   Anemia certainly could contribute to anginal symptoms.  Anemia seems to be improving since his last surgery at the end of July.  He was not told to take iron.  He eats a lot of spinach.  I suspect his dyspnea on exertion is likely from a multitude of factors including the pleural effusion, anemia and recent surgery.  Continue to gradually increase activity to build stamina.  No plan for cardiac testing.  He is back on Plavix monotherapy. Hyperlipidemia: Continue whole food, plant-based diet.  Continue statin therapy. PAD: Occluded right carotid artery.  HTN: The current medical regimen is effective;  continue present plan and medications. LE edema: resolved.  Elevate legs.  Can use Lasix prn once he finishes supply given by his surgeon.    Current medicines are reviewed at length with the patient today.  The patient concerns regarding his medicines were addressed.  The following changes have been made:  No change  Labs/ tests ordered today include:   Orders Placed This Encounter  Procedures   EKG 12-Lead    Recommend 150 minutes/week of aerobic exercise Low fat, low carb, high fiber diet recommended  Disposition:   FU in 6 months   Signed, Larae Grooms, MD  02/02/2021 10:15 AM    [facility] [address], [place], [place]  [address] Phone: [phone]; Fax: [phone]

## 2021-02-02 ENCOUNTER — Encounter: Payer: Self-pay | Admitting: Interventional Cardiology

## 2021-02-02 ENCOUNTER — Ambulatory Visit: Payer: Medicare PPO | Admitting: Adult Health

## 2021-02-02 ENCOUNTER — Other Ambulatory Visit: Payer: Self-pay

## 2021-02-02 ENCOUNTER — Ambulatory Visit: Payer: Medicare PPO | Admitting: Interventional Cardiology

## 2021-02-02 VITALS — BP 138/70 | HR 79 | Ht 70.0 in | Wt 228.2 lb

## 2021-02-02 DIAGNOSIS — E782 Mixed hyperlipidemia: Secondary | ICD-10-CM

## 2021-02-02 DIAGNOSIS — I10 Essential (primary) hypertension: Secondary | ICD-10-CM | POA: Diagnosis not present

## 2021-02-02 DIAGNOSIS — I6521 Occlusion and stenosis of right carotid artery: Secondary | ICD-10-CM | POA: Diagnosis not present

## 2021-02-02 DIAGNOSIS — I25118 Atherosclerotic heart disease of native coronary artery with other forms of angina pectoris: Secondary | ICD-10-CM | POA: Diagnosis not present

## 2021-02-02 MED ORDER — CLOPIDOGREL BISULFATE 75 MG PO TABS: 75.0000 mg | ORAL_TABLET | Freq: Every day | ORAL | 3 refills | Status: DC

## 2021-02-02 NOTE — Patient Instructions (Signed)
Medication Instructions:  Your physician recommends that you continue on your current medications as directed. Please refer to the Current Medication list given to you today.  Clopidogrel (Plavix) refill was sent into pharmacy  *If you need a refill on your cardiac medications before your next appointment, please call your pharmacy*   Lab Work: NONE If you have labs (blood work) drawn today and your tests are completely normal, you will receive your results only by: [place] (if you have MyChart) OR A paper copy in the mail If you have any lab test that is abnormal or we need to change your treatment, we will call you to review the results.   Testing/Procedures: NONE   Follow-Up: At [facility], you and your health needs are our priority.  As part of our continuing mission to provide you with exceptional heart care, we have created designated Provider Care Teams.  These Care Teams include your primary Cardiologist (physician) and Advanced Practice Providers (APPs -  Physician Assistants and Nurse Practitioners) who all work together to provide you with the care you need, when you need it.    Your next appointment:   6 month(s)  The format for your next appointment:   In Person  Provider:   You may see Larae Grooms, MD or one of the following Advanced Practice Providers on your designated Care Team:   Melina Copa, PA-C Ermalinda Barrios, PA-C

## 2021-02-07 ENCOUNTER — Ambulatory Visit: Payer: Medicare PPO | Admitting: Primary Care

## 2021-02-07 ENCOUNTER — Other Ambulatory Visit: Payer: Self-pay

## 2021-02-07 ENCOUNTER — Encounter: Payer: Self-pay | Admitting: Primary Care

## 2021-02-07 ENCOUNTER — Ambulatory Visit (INDEPENDENT_AMBULATORY_CARE_PROVIDER_SITE_OTHER): Payer: Medicare PPO

## 2021-02-07 ENCOUNTER — Telehealth: Payer: Self-pay | Admitting: Pulmonary Disease

## 2021-02-07 VITALS — BP 138/70 | HR 85 | Temp 98.0°F | Ht 70.0 in | Wt 229.8 lb

## 2021-02-07 DIAGNOSIS — J90 Pleural effusion, not elsewhere classified: Secondary | ICD-10-CM | POA: Diagnosis not present

## 2021-02-07 DIAGNOSIS — R0600 Dyspnea, unspecified: Secondary | ICD-10-CM | POA: Diagnosis not present

## 2021-02-07 DIAGNOSIS — J9811 Atelectasis: Secondary | ICD-10-CM | POA: Diagnosis not present

## 2021-02-07 DIAGNOSIS — R0602 Shortness of breath: Secondary | ICD-10-CM | POA: Diagnosis not present

## 2021-02-07 DIAGNOSIS — C787 Secondary malignant neoplasm of liver and intrahepatic bile duct: Secondary | ICD-10-CM

## 2021-02-07 DIAGNOSIS — R0609 Other forms of dyspnea: Secondary | ICD-10-CM

## 2021-02-07 NOTE — Telephone Encounter (Signed)
Patient with dyspnea, persistent right-sided chest discomfort, persistent right pleural effusion on repeat chest x-ray today.  Please arrange thoracentesis at [facility] in the afternoon Friday, 02/11/2021.  The procedure was discussed with the patient and he is in agreement.  I recommend he stop Plavix until after the procedure.  I recommend that he take a baby aspirin 81 mg daily given his history of coronary disease and stents.  After procedure, he can resume Plavix the next day, Saturday, and no longer needs to take aspirin once he resumes Plavix.

## 2021-02-07 NOTE — H&P (View-Only) (Signed)
Please let patient know CXR showed persistent right sided pleural effusion. Plan is to go ahead with thoracentesis for Friday. We will contact him with date/time and further instructions. Continue lasix. Hold off on more prednisone

## 2021-02-07 NOTE — Progress Notes (Signed)
$'@Patient'D$  ID: Luis [name], male    DOB: 05-05-1941, 80 y.o.   MRN: [id number]  Chief Complaint  Patient presents with   Follow-up    Patient reports shortness of breath at rest and exertion.     Referring provider: [name], MD  HPI: 80 year old male, current smoker. PMH significant for Sarcoidosis, HTN, CAD, carotid stenosis, colon cancer with liver metastases. Patient of Dr. Silas Flood, last seen on 10/07/20.  Previous [facility] pulmonary encounter: 10/07/20- Dr. Carmon Sails [name] is a 80 year old man with past medical history significant for sarcoidosis treated for years of prednisone currently in remission, significant coronary artery disease status post multiple drug-eluting stents, currently on chemotherapy for adenocarcinoma of the cecum whom we are seeing in follow-up for dyspnea on exertion. Recent note from surgeon in [place] system reviewed.  Most recent oncology note reviewed.    Doing ok. Dyspnea much better on Breo. Still with nsasal congestion. Wants to decrease Breo since DOE doing better.    HPI at initial visit: Overall, disorders are largely unchanged.  Formally followed with [name] for sarcoidosis.  This has been quiescent for some time.  Most recent CT scan reviewed which reveals on my interpretation stable bilateral fibrosis, scattered calcified granulomas in the lung as well as significant calcified hilar and mediastinal lymphadenopathy and sequela of prior lung surgery.   He has been seen in clinic recently by [name].  Primary complaint was dyspnea on exertion as well as cough.  These persist.  Chest imaging obtained as above.  PFTs obtained last week which reveal suggestion of mild to moderate restriction on spirometry without significant bronchodilator response.  Moderate restriction confirmed with total lung capacity 69% of predicted.  DLCO corrects to within normal limits with adjustment for hemoglobin.   Cough and shortness of breath coincide with  worsening nasal congestion.  Postnasal drip.  Not really using meds to address this this.  He is tolerating chemotherapy well.  Review of labs indicate stable hemoglobin without concern for symptomatic anemia at this time.    02/07/2021- Interim hx  Patient presents today for follow-up. Since his last visit with Dr. Silas Flood he had covid/pneumonia in June 2022 which was treated with paxlovid and prednisone. He also underwent diagnostic laparoscopy, robot assisted partial hepatectomy on 01/06/21. He will need surveillance CT in 2 months.   He reports increased shortness of breath over the last several weeks. He states that it hurts to take a deep breath. He had a CTA on 01/22/21 that was negative for PE, he did have moderate right side pleural effusion which is partially loculated. No consolidation.  He saw cardiology on 02/02/21 for anginal symptoms which started after liver resection. Lasix has helped considerably in the past. He has '10mg'$  lasix prescription from his PCP. He does not have a lot of leg swelling.    No Known Allergies  Immunization History  Administered Date(s) Administered   DTaP / IPV 09/25/2012   Fluad Quad(high Dose 65+) 02/19/2019   Influenza Split 03/27/2014   Influenza, High Dose Seasonal PF 03/27/2014, 01/27/2017, 02/22/2018   Influenza-Unspecified 02/24/2017, 03/10/2020   PFIZER(Purple Top)SARS-COV-2 Vaccination 07/11/2019, 07/21/2019, 01/26/2020, 10/26/2020   Pneumococcal Conjugate-13 03/27/2014   Pneumococcal Polysaccharide-23 11/10/2009   Pneumococcal-Unspecified 03/12/2014   Tdap 03/24/2011, 09/25/2012   Zoster Recombinat (Shingrix) 09/01/2018, 02/19/2019   Zoster, Live 03/21/2010    Past Medical History:  Diagnosis Date   Adenocarcinoma of cecum ([place]) 06/21/2017   Arthritis    "mid back;  hands; knees" (02/24/2015)   Basal cell carcinoma    left shoulder; mid chest; right eyelid (02/24/2015)   CAD (coronary artery disease)    a. 08/2014 Cath/PCI: LM nl, LAD  30p, D1 95 (2.25x12 Resolute Integrity DES), LCX small, RI 100 (attempted PCI) - branches fill via L->L collats, RCA dominant, nl, RPDA/PLA nl, EF 60^. b. 02/24/2015 PCI CTO of Ramus DES x2.   Chronic bronchitis ([place])    hx   Diverticulosis 05/2005   Elevated lipase    Fuchs' corneal dystrophy    History of adenomatous polyp of colon 05/2005   8 mm adenoma   History of blood transfusion    "related to some of my surgeries"   Hyperlipidemia    Hypertension    Iron deficiency anemia due to chronic blood loss 06/21/2017   Pneumonia    Prostate cancer ([place]) 2011   S/P seed implant   Sarcoidosis    Thrombocytopenia ([place])    a. Noted on prior labs, unclear of what w/u done.   Trifascicular block     Tobacco History: Social History   Tobacco Use  Smoking Status Light Smoker   Packs/day: 0.00   Years: 59.00   Pack years: 0.00   Types: Cigars, Cigarettes   Last attempt to quit: 02/24/2015   Years since quitting: 5.9  Smokeless Tobacco Never  Tobacco Comments   02/24/2015 "quit cigarettes ~ 2000 ago but smokes cigars once/month"verified 02/07/21. Once in a while.    Ready to quit: Not Answered Counseling given: Not Answered Tobacco comments: 02/24/2015 "quit cigarettes ~ 2000 ago but smokes cigars once/month"verified 02/07/21. Once in a while.    Outpatient Medications Prior to Visit  Medication Sig Dispense Refill   albuterol (VENTOLIN HFA) 108 (90 Base) MCG/ACT inhaler Inhale 1-2 puffs into the lungs every 6 (six) hours as needed for wheezing or shortness of breath. 1 each 0   CANNABIDIOL PO Take 12 drops by mouth 2 (two) times a day. CBD OIL     Cholecalciferol (VITAMIN D3) 2000 units capsule Take 2,000 Units by mouth every evening.      clobetasol (TEMOVATE) 0.05 % external solution Apply topically 2 (two) times daily.     clopidogrel (PLAVIX) 75 MG tablet Take 1 tablet (75 mg total) by mouth daily. 90 tablet 3   clotrimazole (LOTRIMIN) 1 % cream Apply topically 2 (two) times  daily.     fluticasone furoate-vilanterol (BREO ELLIPTA) 200-25 MCG/INH AEPB Inhale 1 puff into the lungs daily. 180 each 11   magnesium oxide (MAG-OX) 400 MG tablet Take 400 mg by mouth as needed.     naproxen sodium (ALEVE) 220 MG tablet Take by mouth.     nitroGLYCERIN (NITROSTAT) 0.4 MG SL tablet PLACE 1 TABLET UNDER THE TONGUE EVERY 5 MINUTES AS NEEDED FOR CHEST PAIN FOR 3 DOSES 25 tablet 3   Polyethylene Glycol 3350 (MIRALAX PO) Take 17 g by mouth daily as needed (constipation).     prochlorperazine (COMPAZINE) 10 MG tablet Take 1 tablet (10 mg total) by mouth every 6 (six) hours as needed. 60 tablet 1   rosuvastatin (CRESTOR) 20 MG tablet TAKE 1 TABLET BY MOUTH EVERY DAY 90 tablet 2   traMADol (ULTRAM) 50 MG tablet Take 50-100 mg by mouth every 6 (six) hours as needed for moderate pain or severe pain.     dexamethasone (DECADRON) 4 MG tablet Take 2 tablets ('8mg'$  total) by mouth in the morning for 2 days after the first day of each  cycle, then as directed. (Patient not taking: Reported on 02/07/2021)     fluticasone (FLONASE) 50 MCG/ACT nasal spray Place 1 spray into both nostrils daily. (Patient not taking: Reported on 02/07/2021) 16 g 2   furosemide (LASIX) 20 MG tablet Take 1 tablet (20 mg total) by mouth daily for 5 days. 5 tablet 0   loratadine (CLARITIN) 10 MG tablet Take 1 tablet (10 mg total) by mouth daily. (Patient not taking: Reported on 02/07/2021) 30 tablet 11   metroNIDAZOLE (METROCREAM) 0.75 % cream Apply as directed for rash starting the day before cycle 1 day 1. (Patient not taking: Reported on 02/07/2021)     ranolazine (RANEXA) 500 MG 12 hr tablet Take 1 tablet (500 mg total) by mouth 2 (two) times daily. (Patient not taking: Reported on 02/07/2021) 180 tablet 3   No facility-administered medications prior to visit.    Review of Systems  Review of Systems  Constitutional: Negative.   HENT: Negative.    Respiratory:  Positive for chest tightness and shortness of breath.  Negative for cough and wheezing.   Cardiovascular:  Negative for leg swelling.    Physical Exam  BP 138/70 (BP Location: Left Arm, Patient Position: Sitting, Cuff Size: Normal)   Pulse 85   Temp 98 F (36.7 C) (Oral)   Ht '5\' 10"'$  (1.778 m)   Wt 229 lb 12.8 oz (104.2 kg)   SpO2 97%   BMI 32.97 kg/m  Physical Exam Constitutional:      Appearance: Normal appearance.  HENT:     Head: Normocephalic and atraumatic.     Mouth/Throat:     Mouth: Mucous membranes are moist.     Pharynx: Oropharynx is clear.  Cardiovascular:     Rate and Rhythm: Normal rate and regular rhythm.  Pulmonary:     Effort: Pulmonary effort is normal.     Breath sounds: Rales present. No wheezing.     Comments: Rales left base, diminished right base  Musculoskeletal:        General: Normal range of motion.  Skin:    General: Skin is warm and dry.  Neurological:     General: No focal deficit present.     Mental Status: He is alert and oriented to person, place, and time. Mental status is at baseline.     Lab Results:  CBC    Component Value Date/Time   WBC 4.5 01/22/2021 1634   RBC 3.32 (L) 01/22/2021 1634   HGB 9.6 (L) 01/22/2021 1634   HGB 12.1 (L) 06/07/2020 1119   HGB 15.5 12/25/2018 1306   HCT 31.2 (L) 01/22/2021 1634   HCT 45.7 12/25/2018 1306   PLT 272 01/22/2021 1634   PLT 138 (L) 06/07/2020 1119   PLT 142 (L) 12/25/2018 1306   MCV 94.0 01/22/2021 1634   MCV 92 12/25/2018 1306   MCH 28.9 01/22/2021 1634   MCHC 30.8 01/22/2021 1634   RDW 16.6 (H) 01/22/2021 1634   RDW 14.4 12/25/2018 1306   LYMPHSABS 0.5 (L) 01/22/2021 1634   MONOABS 0.6 01/22/2021 1634   EOSABS 0.2 01/22/2021 1634   BASOSABS 0.0 01/22/2021 1634    BMET    Component Value Date/Time   NA 139 01/22/2021 1634   NA 138 12/25/2018 1306   K 4.5 01/22/2021 1634   CL 107 01/22/2021 1634   CO2 24 01/22/2021 1634   GLUCOSE 106 (H) 01/22/2021 1634   BUN 6 (L) 01/22/2021 1634   BUN 15 12/25/2018 1306   CREATININE  0.79 01/22/2021 1634   CREATININE 0.83 06/07/2020 1119   CREATININE 1.02 05/16/2016 0937   CALCIUM 8.8 (L) 01/22/2021 1634   GFRNONAA >60 01/22/2021 1634   GFRNONAA >60 06/07/2020 1119   GFRAA >60 03/10/2020 0855    BNP    Component Value Date/Time   BNP 79.4 01/22/2021 1634   BNP 43.4 05/16/2016 0937    ProBNP    Component Value Date/Time   PROBNP 60.0 08/10/2014 1052    Imaging: DG Chest 2 View  Result Date: 01/12/2021 CLINICAL DATA:  sob, fluid overload EXAM: CHEST - 2 VIEW COMPARISON:  12/23/2020 FINDINGS: Stable cardiomediastinal contours. Atherosclerotic calcification of the aortic knob. Right-sided chest port remains in place. Moderate right-sided pleural effusion, increased from prior. Hazy right basilar opacity. Suture line noted within the right lung base. No pneumothorax. IMPRESSION: Moderate right-sided pleural effusion, increased from prior. Electronically Signed   By: Davina Poke D.O.   On: 01/12/2021 14:03   CT Angio Chest PE W and/or Wo Contrast  Result Date: 01/22/2021 CLINICAL DATA:  PE suspected, high prob. Shortness of breath. COVID-19 positive. Metastatic colorectal cancer EXAM: CT ANGIOGRAPHY CHEST WITH CONTRAST TECHNIQUE: Multidetector CT imaging of the chest was performed using the standard protocol during bolus administration of intravenous contrast. Multiplanar CT image reconstructions and MIPs were obtained to evaluate the vascular anatomy. CONTRAST:  41m OMNIPAQUE IOHEXOL 350 MG/ML SOLN COMPARISON:  CT 01/12/2021 FINDINGS: Cardiovascular: Satisfactory opacification of the pulmonary arteries to the segmental level. No evidence of pulmonary embolism. Thoracic aorta is nonaneurysmal. Atherosclerotic calcifications of the aorta and coronary arteries. Normal heart size. No pericardial effusion. Right-sided Port-A-Cath with distal tip terminating at the superior cavoatrial junction. Mediastinum/Nodes: No enlarged mediastinal, hilar, or axillary lymph nodes.  Multiple scattered calcified mediastinal and bilateral hilar lymph nodes, unchanged. Thyroid gland, trachea, and esophagus demonstrate no significant findings. Lungs/Pleura: Moderate right-sided pleural effusion which is partially loculated. Volume slightly increased from prior CT. Chronic areas of scarring within the right upper lobe. Additional areas of more linear scarring/atelectasis within the bilateral lung bases. Numerous scattered calcified granulomas. No new airspace consolidation. No pneumothorax. Upper Abdomen: Hypodense mass within the lateral aspect of the right hepatic lobe is unchanged. Additional areas of hypodensity seen within the left hepatic lobe are also unchanged. Partially visualized large mass or collection within or adjacent to the gallbladder fossa measuring up to 5.7 cm. Partially visualized upper abdominal drain. Musculoskeletal: No new or acute bony findings. Review of the MIP images confirms the above findings. IMPRESSION: 1. Negative for pulmonary embolism. 2. Moderate right-sided pleural effusion which is partially loculated. Volume slightly increased from prior CT. 3. No new airspace consolidation. 4. Hepatic metastatic disease. Continued nonemergent surveillance per oncologic recommendations. Aortic Atherosclerosis (ICD10-I70.0). Electronically Signed   By: NDavina PokeD.O.   On: 01/22/2021 18:34   CT Angio Chest PE W/Cm &/Or Wo Cm  Result Date: 01/12/2021 CLINICAL DATA:  Progressive shortness of breath since last week, history of metastatic colorectal cancer, recent resection of liver and chest metastases EXAM: CT ANGIOGRAPHY CHEST WITH CONTRAST TECHNIQUE: Multidetector CT imaging of the chest was performed using the standard protocol during bolus administration of intravenous contrast. Multiplanar CT image reconstructions and MIPs were obtained to evaluate the vascular anatomy. CONTRAST:  732mOMNIPAQUE IOHEXOL 350 MG/ML SOLN COMPARISON:  05/26/2020, 01/12/2021 FINDINGS:  Cardiovascular: This is a technically suboptimal contrast bolus. There is sufficient contrast enhancement to exclude central and proximal segmental pulmonary emboli. The distal segmental and subsegmental branches are incompletely opacified  and cannot be fully evaluated. The heart is unremarkable without pericardial effusion. No evidence of thoracic aortic aneurysm or dissection. Moderate atherosclerosis of the aorta and coronary vessels. Right chest wall port tip within the superior vena cava. Mediastinum/Nodes: Numerous calcified mediastinal and hilar lymph nodes are unchanged. No pathologic adenopathy. Thyroid, trachea, and esophagus are stable. Lungs/Pleura: There is a small right pleural effusion, partially loculated at the right apex. Volume estimated less than 1 L. stable areas of consolidation in the right upper lobe likely scarring. Minimal compressive atelectasis left lower lobe. No pneumothorax. Central airways are patent. Upper Abdomen: Hypodensity right lobe liver measuring up to 4 cm consistent with metastatic disease, increased since prior study. Evaluation of the liver is limited due to timing of contrast bolus. There is a small amount of fluid along the capsule left lobe liver measuring 2.4 x 3.8 cm, please correlate with recent surgical history as this could reflect postoperative seroma. There is a surgical drain in the central upper abdomen interposed between the stomach and liver. Musculoskeletal: Subcutaneous gas within the chest wall likely related to recent surgical intervention. Please correlate with operative history. There are no acute or destructive bony lesions. Reconstructed images demonstrate no additional findings. Review of the MIP images confirms the above findings. IMPRESSION: 1. Limited evaluation of the pulmonary vasculature due to suboptimal contrast bolus. No central or proximal segmental pulmonary emboli. 2. Small right pleural effusion, partially loculated at the right apex,  volume estimated less than 1 L. 3. Chronic right upper lobe consolidation likely scarring. Compressive atelectasis right lower lobe. 4. Enlarging liver hypodensity consistent with metastatic disease. 5. Postsurgical changes central upper abdomen, with likely postoperative seroma along the inferior margin left lobe liver. 6.  Aortic Atherosclerosis (ICD10-I70.0). Electronically Signed   By: Randa Ngo M.D.   On: 01/12/2021 16:36   DG Chest Portable 1 View  Result Date: 01/22/2021 CLINICAL DATA:  Shortness of breath EXAM: PORTABLE CHEST 1 VIEW COMPARISON:  01/12/2021 FINDINGS: Cardiac shadow is enlarged but stable. Right-sided chest wall port is again seen. Scattered calcified hilar and mediastinal lymph nodes are noted as well as multiple calcified granulomas within both lungs. Postsurgical changes are seen in the right lung base. Previously seen right-sided pleural effusion is nearly completely resolved. No focal confluent infiltrate is seen. IMPRESSION: Small right-sided pleural effusion. Changes of prior granulomatous disease. No new acute abnormality noted. Electronically Signed   By: Inez Catalina M.D.   On: 01/22/2021 17:12     Assessment & Plan:   DOE (dyspnea on exertion) - Patient reports increased shortness of breath over the last several weeks with associated chest discomfort while taking deep breath. He had CTA on 01/22/21 that was negative for PE but showed moderate right sided pleural effusion which is partially loculated. Dr. Silas Flood in the room to speak with patient today. Plan is to check CXR today and if effusion is present we will plan for thoracentesis this Friday 02/11/21.  He has responsed well to oral lasix in the past.  Recommend he continue lasix '10mg'$  daily. We will need ok from cardiology to hold plavix prior to procedure.   Liver metastases ([place]) - Hx colon cancer with liver mets. S/p partial hepatectomy on 01/06/21. Needs follow-up CT imaging in 2 months (September-October  2022)   Martyn Ehrich, NP 02/07/2021

## 2021-02-07 NOTE — Assessment & Plan Note (Addendum)
-   Patient reports increased shortness of breath over the last several weeks with associated chest discomfort while taking deep breath. He had CTA on 01/22/21 that was negative for PE but showed moderate right sided pleural effusion which is partially loculated. Dr. Silas Flood in the room to speak with patient today. Plan is to check CXR today and if effusion is present we will plan for thoracentesis this Friday 02/11/21.  He has responsed well to oral lasix in the past.  Recommend he continue lasix '10mg'$  daily. We will need ok from cardiology to hold plavix prior to procedure.

## 2021-02-07 NOTE — Assessment & Plan Note (Signed)
-   Hx colon cancer with liver mets. S/p partial hepatectomy on 01/06/21. Needs follow-up CT imaging in 2 months (September-October 2022)

## 2021-02-07 NOTE — Progress Notes (Signed)
Please let patient know CXR showed persistent right sided pleural effusion. Plan is to go ahead with thoracentesis for Friday. We will contact him with date/time and further instructions. Continue lasix. Hold off on more prednisone

## 2021-02-07 NOTE — Patient Instructions (Addendum)
  Recommendations: - Stay on '10mg'$  lasix daily - Checking CXR today, if there is still a moderate amount of fluid on the right side then we will plan thoracesis for Friday. If CXR is normal we can consider prednisone at that time

## 2021-02-08 ENCOUNTER — Other Ambulatory Visit: Payer: Self-pay

## 2021-02-08 NOTE — Telephone Encounter (Signed)
Dr. Silas Flood what labs do you want

## 2021-02-08 NOTE — Telephone Encounter (Signed)
Patient is scheduled for 02/11/21 at [place] at 3pm patient knows to arrive at 2:30 patient knows about the Plavix and baby asa.  Nothing further needed at this time.

## 2021-02-08 NOTE — Telephone Encounter (Signed)
Patient is aware of results and voiced his understanding.  Are any labs needed for thoracentesis?

## 2021-02-11 ENCOUNTER — Ambulatory Visit (HOSPITAL_COMMUNITY)
Admission: RE | Admit: 2021-02-11 | Discharge: 2021-02-11 | Disposition: A | Discharge status: Home / Self Care | Payer: Medicare PPO | Attending: Pulmonary Disease | Admitting: Pulmonary Disease

## 2021-02-11 ENCOUNTER — Ambulatory Visit (HOSPITAL_COMMUNITY): Payer: Medicare PPO

## 2021-02-11 ENCOUNTER — Encounter (HOSPITAL_COMMUNITY): Payer: Self-pay | Admitting: Pulmonary Disease

## 2021-02-11 ENCOUNTER — Encounter (HOSPITAL_COMMUNITY)
Admission: RE | Disposition: A | Discharge status: Home / Self Care | Payer: Self-pay | Source: Home / Self Care | Attending: Pulmonary Disease

## 2021-02-11 DIAGNOSIS — R091 Pleurisy: Secondary | ICD-10-CM | POA: Diagnosis not present

## 2021-02-11 DIAGNOSIS — I517 Cardiomegaly: Secondary | ICD-10-CM | POA: Diagnosis not present

## 2021-02-11 DIAGNOSIS — Z9889 Other specified postprocedural states: Secondary | ICD-10-CM

## 2021-02-11 DIAGNOSIS — J90 Pleural effusion, not elsewhere classified: Secondary | ICD-10-CM | POA: Insufficient documentation

## 2021-02-11 HISTORY — PX: THORACENTESIS: SHX235

## 2021-02-11 LAB — LACTATE DEHYDROGENASE, PLEURAL OR PERITONEAL FLUID: LD, Fluid: 116 U/L — ABNORMAL HIGH (ref 3–23)

## 2021-02-11 LAB — PROTEIN, PLEURAL OR PERITONEAL FLUID: Total protein, fluid: 3.9 g/dL

## 2021-02-11 LAB — BODY FLUID CELL COUNT WITH DIFFERENTIAL
Eos, Fluid: 0 %
Lymphs, Fluid: 93 %
Monocyte-Macrophage-Serous Fluid: 5 % — ABNORMAL LOW (ref 50–90)
Neutrophil Count, Fluid: 2 % (ref 0–25)
Other Cells, Fluid: 1 %
Total Nucleated Cell Count, Fluid: 1475 cu mm — ABNORMAL HIGH (ref 0–1000)

## 2021-02-11 LAB — ALBUMIN, PLEURAL OR PERITONEAL FLUID: Albumin, Fluid: 2.8 g/dL

## 2021-02-11 SURGERY — THORACENTESIS
Anesthesia: LOCAL

## 2021-02-11 NOTE — Op Note (Signed)
Thoracentesis  Procedure Note  Luis [name]  [id number]  03/16/1941  Date:02/11/21  Time:3:39 PM   Provider Performing:Kristina Mcnorton R [name]   Procedure: Thoracentesis with imaging guidance [id number])  Indication(s) Pleural Effusion  Consent Risks of the procedure as well as the alternatives and risks of each were explained to the patient and/or caregiver.  Consent for the procedure was obtained and is signed in the bedside chart  Anesthesia Topical only with 1% lidocaine    Time Out Verified patient identification, verified procedure, site/side was marked, verified correct patient position, special equipment/implants available, medications/allergies/relevant history reviewed, required imaging and test results available.   Sterile Technique Maximal sterile technique including full sterile barrier drape, hand hygiene, sterile gown, sterile gloves, mask, hair covering, sterile ultrasound probe cover (if used).  Procedure Description Ultrasound was used to identify appropriate pleural anatomy for placement and overlying skin marked.  Area of drainage cleaned and draped in sterile fashion. Lidocaine was used to anesthetize the skin and subcutaneous tissue.  600 cc's of serous, cloudy, slightly blood tinged appearing fluid was drained from the right pleural space. Procedure termianted due to cessation of fluid. Catheter then removed and bandaid applied to site.   Complications/Tolerance None; patient tolerated the procedure well. Chest X-ray is ordered to confirm no post-procedural complication.   EBL none   Specimen(s) Pleural fluid  - micro, chemistries, cell count, cytology

## 2021-02-11 NOTE — Brief Op Note (Signed)
02/11/2021  3:38 PM  PATIENT:  Luis [name]  80 y.o. male  PRE-OPERATIVE DIAGNOSIS:  pleural effusion  POST-OPERATIVE DIAGNOSIS:  s/p thoracentesis  PROCEDURE:  Procedure(s): THORACENTESIS (N/A)  SURGEON:  Surgeon(s) and Role:    * Markeda Narvaez, Bonna Gains, MD - Primary  PHYSICIAN ASSISTANT:   ASSISTANTS: none   ANESTHESIA:   topical  EBL:  None   LOCAL MEDICATIONS USED:  LIDOCAINE  1% and Amount: 8 ml  PLAN OF CARE:  Discharge  PATIENT DISPOSITION:   home   Delay start of Pharmacological VTE agent (>24hrs) due to surgical blood loss or risk of bleeding: no

## 2021-02-11 NOTE — H&P (Signed)
History and Physical Interval Note:  02/11/2021 12:17 PM  Luis [name]  has presented today for procedure, with the diagnosis of pleural effusion.  The various methods of treatment have been discussed with the patient and family. After consideration of risks, benefits and other options for treatment, the patient has consented to  Procedure(s): THORACENTESIS (N/A) as a surgical intervention.  The patient's history has been reviewed, patient examined, no change in status, stable for procedure.  I have reviewed the patient's chart and labs.  Questions were answered to the patient's satisfaction.     Lanier Clam MD

## 2021-02-13 LAB — TRIGLYCERIDES, BODY FLUIDS: Triglycerides, Fluid: 24 mg/dL

## 2021-02-14 ENCOUNTER — Encounter (HOSPITAL_COMMUNITY): Payer: Self-pay | Admitting: Pulmonary Disease

## 2021-02-15 LAB — BODY FLUID CULTURE W GRAM STAIN: Culture: NO GROWTH

## 2021-02-17 LAB — CYTOLOGY - NON PAP

## 2021-03-03 NOTE — Interval H&P Note (Signed)
History and Physical Interval Note:  03/03/2021 1:42 PM  Luis [name]  has presented today for surgery, with the diagnosis of pleural effusion.  The various methods of treatment have been discussed with the patient and family. After consideration of risks, benefits and other options for treatment, the patient has consented to  Procedure(s): THORACENTESIS (N/A) as a surgical intervention.  The patient's history has been reviewed, patient examined, no change in status, stable for surgery.  I have reviewed the patient's chart and labs.  Questions were answered to the patient's satisfaction.     [name]

## 2021-03-08 ENCOUNTER — Other Ambulatory Visit: Payer: Self-pay

## 2021-03-08 ENCOUNTER — Inpatient Hospital Stay: Payer: Medicare PPO | Attending: Oncology | Admitting: Oncology

## 2021-03-08 VITALS — BP 137/78 | HR 76 | Temp 98.3°F | Resp 18 | Ht 70.0 in | Wt 229.0 lb

## 2021-03-08 DIAGNOSIS — C787 Secondary malignant neoplasm of liver and intrahepatic bile duct: Secondary | ICD-10-CM | POA: Diagnosis not present

## 2021-03-08 DIAGNOSIS — C180 Malignant neoplasm of cecum: Secondary | ICD-10-CM | POA: Insufficient documentation

## 2021-03-08 DIAGNOSIS — I2510 Atherosclerotic heart disease of native coronary artery without angina pectoris: Secondary | ICD-10-CM | POA: Insufficient documentation

## 2021-03-08 DIAGNOSIS — G620 Drug-induced polyneuropathy: Secondary | ICD-10-CM | POA: Insufficient documentation

## 2021-03-08 NOTE — Progress Notes (Signed)
[facility] OFFICE PROGRESS NOTE   Diagnosis: Colon cancer  INTERVAL HISTORY:   Luis [name] underwent a partial right hepatectomy and ablation of a liver lesion on 01/06/2021.  The pathology revealed adenocarcinoma consistent with a colorectal primary.  He has recovered from surgery.  His return to work.  No new complaint.  He continues to have intermittent dyspnea.  Objective:  Vital signs in last 24 hours:  Blood pressure 137/78, pulse 76, temperature 98.3 F (36.8 C), temperature source Oral, resp. rate 18, height 5' 10" (1.778 m), weight 229 lb (103.9 kg), SpO2 98 %.     Lymphatics: No cervical, supraclavicular, axillary, or inguinal nodes Resp: Lungs clear bilaterally Cardio: Regular rate and rhythm GI: No hepatosplenomegaly, left abdomen infusion pump Vascular: No leg edema  Skin: Few acne type lesions over the chest and back, faded panitumumab rash over the trunk  Portacath/PICC-without erythema  Lab Results:  Lab Results  Component Value Date   WBC 4.5 01/22/2021   HGB 9.6 (L) 01/22/2021   HCT 31.2 (L) 01/22/2021   MCV 94.0 01/22/2021   PLT 272 01/22/2021   NEUTROABS 3.2 01/22/2021    CMP  Lab Results  Component Value Date   NA 139 01/22/2021   K 4.5 01/22/2021   CL 107 01/22/2021   CO2 24 01/22/2021   GLUCOSE 106 (H) 01/22/2021   BUN 6 (L) 01/22/2021   CREATININE 0.79 01/22/2021   CALCIUM 8.8 (L) 01/22/2021   PROT 6.9 01/22/2021   ALBUMIN 3.5 01/22/2021   AST 39 01/22/2021   ALT 22 01/22/2021   ALKPHOS 126 01/22/2021   BILITOT 1.0 01/22/2021   GFRNONAA >60 01/22/2021   GFRAA >60 03/10/2020    Lab Results  Component Value Date   CEA1 2.10 04/14/2020   CEA 1.3 06/20/2017     Medications: I have reviewed the patient's current medications.   Assessment/Plan: Adenocarcinoma of the cecum, stage IIA (T3N0), status post a right colectomy 07/26/2017 0/16 lymph nodes positive, no lymphovascular invasion, perineural invasion  present MSI-stable, no loss of mismatch repair protein expression Surveillance colonoscopy 07/26/2018-no polyps found Elevated CEA June 2020, persistently elevated August 2020 CTs 02/07/2019- bilateral liver metastases, no extrahepatic metastatic disease, changes of sarcoidosis in the chest Biopsy liver lesion 03/04/2019-metastatic adenocarcinoma, with morphology consistent with metastatic colonic adenocarcinoma, RAS WT Cycle 1 FOLFOX 03/12/2019 Cycle 2 FOLFOX 03/26/2019, oxaliplatin dose reduced secondary to thrombocytopenia Cycle 3 FOLFOX 04/09/2019, oxaliplatin further dose reduced and aspirin held secondary to thrombocytopenia Cycle 4 FOLFOX 04/23/2019 Cycle 5 FOLFOX 05/07/2019 CTs 05/23/2019-decrease in liver lesions.  No new or progressive findings. Cycle 6 FOLFOX 05/27/2019 Cycle 7 FOLFOX 06/14/2019 Cycle 8 FOLFOX 07/02/2019-Udenyca held Cycle 9 FOLFOX 07/16/2019-Udenyca held Cycle 10 FOLFOX 07/30/2019-Udenyca held CT abdomen/pelvis 08/13/2019-mild improvement in previously noted liver lesions MRI abdomen 08/13/2019-3 liver lesions  Cycle 1 FOLFIRI/Avastin 09/17/2019 Cycle 2 FOLFIRI/Avastin 10/01/2019 Cycle 3 FOLFIRI/Avastin 10/15/2019 Cycle 4 FOLFIRI/Avastin 11/05/2019 Cycle 5 FOLFIRI/Avastin 11/19/2019 CTs 11/27/2019-stable liver lesions; no new liver lesions.  No other evidence of metastatic disease in the abdomen or pelvis. Cycle 1 FOLFIRI/Panitumumab 12/03/2019 Cycle 2 FOLFIRI/Panitumumab 12/17/2019 (Panitumumab dose reduced due to rash) Cycle 3 FOLFIRI/Panitumumab 01/01/2020 Cycle 4 FOLFIRI/Panitumumab 01/14/2020 Cycle 5 FOLFIRI/Panitumumab 01/28/2020 CT abdomen/pelvis 02/06/2020-decreased size of right hepatic lesions, subtle lesion in the lateral left hepatic lobe-not clearly identified, no evidence of disease progression Cycle 6 FOLFIRI/Panitumumab 02/11/2020 Cycle 7 FOLFIRI/Panitumumab 03/10/2020 Cycle 8 FOLFIRI/Panitumumab 03/26/2020 Cycle 9 FOLFIRI/Panitumumab 04/14/2020 CT abdomen/pelvis  04/26/2020-mild enlargement of 2 liver lesions on initial  report, repeat review with radiology consistent with stable disease Cycle 10 FOLFIRI/Panitumumab 04/29/2020 MRI liver 05/19/2020-right hepatic lobe lesions diminished in size when compared to the study of March 2021.  Lesion in hepatic subsegment VI with very limited assessment as well due to adjacent bowel. 05/20/2020 referred to interventional radiology to consider ablation of liver lesions CT abdomen 06/30/2020 prior to planned ablation procedure, enlargement of segment 7 liver lesion and segment 6 liver lesion.  Interval recurrence of segment 2 lesion measuring 4.5 cm, ablation aborted 07/23/2020-diagnostic laparoscopy, robotic assisted insertion of hepatic infusion pump, portal lymphadenectomy-granulomatous inflammation compatible with sarcoidosis, core tumor biopsy segment 6, infiltrating adenocarcinoma 08/02/2020-cycle 1 FUDR at 50% dose 08/16/2020-FOLFIRI/panitumumab CTs 10/25/2020-significantly decreased size of hepatic metastases 10/25/2020 cycle 6 FOLFIRI/Panitumumab, FUDR # 4 11/09/2020-cycle 7 FOLFIRI/panitumumab 01/06/2021-diagnostic laparoscopy, partial hepatectomy-segment 2, segment 6, microwave ablation segment 7-3 tumors identified, segment 2 and 6 tumors resected-adenocarcinoma with negative margins, segment 2 lesion with less than 1 mm hepatic parenchymal margin  History of colon polyps Microcytic anemia January 2019 Sarcoidosis Coronary artery disease Port-A-Cath placement, interventional radiology, 03/04/2019 Thrombocytopenia, mild thrombocytopenia predating chemotherapy Oxaliplatin neuropathy Dyspnea on exertion-CT chest 02/25/2020 negative for PE, infection, pneumonitis; scheduled for PFTs 03/10/2020, followed by pulmonary     Disposition: Luis [name] is in clinical remission colon cancer.  He has recovered from the hepatic surgery.  He is scheduled for a restaging evaluation at [facility] on 03/28/2021.  He will return for an office  visit here on 04/25/2021.  We will plan to begin flushing the Port-A-Cath and hepatic infusion pump if Dr. Fayrene Helper is in agreement.  Betsy Coder, MD  03/08/2021  12:16 PM

## 2021-03-10 ENCOUNTER — Other Ambulatory Visit: Payer: Self-pay

## 2021-03-10 ENCOUNTER — Ambulatory Visit (INDEPENDENT_AMBULATORY_CARE_PROVIDER_SITE_OTHER): Payer: Medicare PPO | Admitting: Pulmonary Disease

## 2021-03-10 ENCOUNTER — Encounter: Payer: Self-pay | Admitting: Pulmonary Disease

## 2021-03-10 VITALS — BP 150/78 | HR 78 | Temp 98.2°F | Ht 70.0 in | Wt 231.4 lb

## 2021-03-10 DIAGNOSIS — D869 Sarcoidosis, unspecified: Secondary | ICD-10-CM

## 2021-03-10 DIAGNOSIS — R0609 Other forms of dyspnea: Secondary | ICD-10-CM

## 2021-03-10 DIAGNOSIS — R0600 Dyspnea, unspecified: Secondary | ICD-10-CM

## 2021-03-10 NOTE — Patient Instructions (Addendum)
Nice to see you again  Your lungs sound clear, no signs of fluid returning.  I think the fluid was related to inflammation in the liver as the chemotherapy was working that worsened with your surgery.  Feel free to trial Breo again and keep a journal to see if it really helps.  If it does not seem helpful as it has not to date, okay to discontinue this again.  Return to clinic in 4 months or sooner as needed

## 2021-03-10 NOTE — Progress Notes (Signed)
@Patient  ID: Luis [name], male    DOB: [birth year], 80 y.o.   MRN: [id number]  Chief Complaint  Patient presents with   Follow-up    Since fluid was drained patient states he feels better but not where he needs to be. Wheezing in the morning.     Referring provider: [name], MD  HPI:   80 y.o. man whom we are seeing in follow-up of multiple pulmonary issues including burnt out sarcoid, asthma, recent development of right pleural effusion.  Interim since last seeing me, he has continued chemotherapy via pump that is implanted.  He had good response.  He had surgery to resect tumor in his liver.  Before the surgery, I have noted to have small pleural effusion on the right.  This worsened after the surgery.  Has worsening shortness of breath, dyspnea exertion.  Breo was not very effective.  He underwent thoracentesis 02/11/2021 that revealed exudative effusion via protein, lymphocyte predominant, cytology negative.  After removal of fluid, he feels much better.  600 cc of serous fluid was removed.  Procedure performed by me.  HPI at initial visit: Overall, disorders are largely unchanged.  Formally followed with [name] for sarcoidosis.  This has been quiescent for some time.  Most recent CT scan reviewed which reveals on my interpretation stable bilateral fibrosis, scattered calcified granulomas in the lung as well as significant calcified hilar and mediastinal lymphadenopathy and sequela of prior lung surgery.  He has been seen in clinic recently by [name].  Primary complaint was dyspnea on exertion as well as cough.  These persist.  Chest imaging obtained as above.  PFTs obtained last week which reveal suggestion of mild to moderate restriction on spirometry without significant bronchodilator response.  Moderate restriction confirmed with total lung capacity 69% of predicted.  DLCO corrects to within normal limits with adjustment for hemoglobin.  Cough and shortness of  breath coincide with worsening nasal congestion.  Postnasal drip.  Not really using meds to address this this.  He is tolerating chemotherapy well.  Review of labs indicate stable hemoglobin without concern for symptomatic anemia at this time.   Questionaires / Pulmonary Flowsheets:   ACT:  No flowsheet data found.  MMRC: mMRC Dyspnea Scale mMRC Score  08/09/2020 0    Epworth:  No flowsheet data found.  Tests:   FENO:  No results found for: NITRICOXIDE  PFT: PFT Results Latest Ref Rng & Units 03/10/2020 08/30/2017  FVC-Pre L 2.94 3.14  FVC-Predicted Pre % 68 72  FVC-Post L 2.90 3.18  FVC-Predicted Post % 67 73  Pre FEV1/FVC % % 66 70  Post FEV1/FCV % % 68 73  FEV1-Pre L 1.93 2.19  FEV1-Predicted Pre % 62 69  FEV1-Post L 1.96 2.31  DLCO uncorrected ml/min/mmHg 19.10 17.73  DLCO UNC% % 75 52  DLCO corrected ml/min/mmHg 21.40 20.68  DLCO COR %Predicted % 84 61  DLVA Predicted % 118 98  TLC L 5.02 4.99  TLC % Predicted % 69 68  RV % Predicted % 76 71  Personally reviewed and interpreted as normal spirometry, reduced DLCO, moderate reduced TLC  WALK:  SIX MIN WALK 08/28/2017  Supplimental Oxygen during Test? (L/min) No  Tech Comments: pt walked a fast pace, tolerated walk well.     Imaging: Personally reviewed and as per EMR and discussion in this note  DG CHEST PORT 1 VIEW  Result Date: 02/11/2021 CLINICAL DATA:  Status post right thoracentesis EXAM: PORTABLE CHEST  1 VIEW COMPARISON:  02/07/2021 FINDINGS: Gross cardiomegaly. Right chest port catheter. Interval decrease in volume of a small right pleural effusion, almost completely resolved. No pneumothorax. Surgical suture about the right lung base. The left lung is normally aerated. IMPRESSION: 1. Interval decrease in volume of a small right pleural effusion, almost completely resolved. No pneumothorax. 2.  Cardiomegaly. Electronically Signed   By: Eddie Candle M.D.   On: 02/11/2021 16:07    Lab Results: Personally  reviewed, eosinophils routinely 200-300 CBC    Component Value Date/Time   WBC 4.5 01/22/2021 1634   RBC 3.32 (L) 01/22/2021 1634   HGB 9.6 (L) 01/22/2021 1634   HGB 12.1 (L) 06/07/2020 1119   HGB 15.5 12/25/2018 1306   HCT 31.2 (L) 01/22/2021 1634   HCT 45.7 12/25/2018 1306   PLT 272 01/22/2021 1634   PLT 138 (L) 06/07/2020 1119   PLT 142 (L) 12/25/2018 1306   MCV 94.0 01/22/2021 1634   MCV 92 12/25/2018 1306   MCH 28.9 01/22/2021 1634   MCHC 30.8 01/22/2021 1634   RDW 16.6 (H) 01/22/2021 1634   RDW 14.4 12/25/2018 1306   LYMPHSABS 0.5 (L) 01/22/2021 1634   MONOABS 0.6 01/22/2021 1634   EOSABS 0.2 01/22/2021 1634   BASOSABS 0.0 01/22/2021 1634    BMET    Component Value Date/Time   NA 139 01/22/2021 1634   NA 138 12/25/2018 1306   K 4.5 01/22/2021 1634   CL 107 01/22/2021 1634   CO2 24 01/22/2021 1634   GLUCOSE 106 (H) 01/22/2021 1634   BUN 6 (L) 01/22/2021 1634   BUN 15 12/25/2018 1306   CREATININE 0.79 01/22/2021 1634   CREATININE 0.83 06/07/2020 1119   CREATININE 1.02 05/16/2016 0937   CALCIUM 8.8 (L) 01/22/2021 1634   GFRNONAA >60 01/22/2021 1634   GFRNONAA >60 06/07/2020 1119   GFRAA >60 03/10/2020 0855    BNP    Component Value Date/Time   BNP 79.4 01/22/2021 1634   BNP 43.4 05/16/2016 0937    ProBNP    Component Value Date/Time   PROBNP 60.0 08/10/2014 1052    Specialty Problems       Pulmonary Problems   Pulmonary fibrosis (HCC)   DOE (dyspnea on exertion)    No Known Allergies  Immunization History  Administered Date(s) Administered   DTaP / IPV 09/25/2012   Fluad Quad(high Dose 65+) 02/19/2019   Influenza Split 03/27/2014   Influenza, High Dose Seasonal PF 03/27/2014, 01/27/2017, 02/22/2018   Influenza-Unspecified 02/24/2017, 03/10/2020   PFIZER(Purple Top)SARS-COV-2 Vaccination 07/11/2019, 07/21/2019, 01/26/2020, 10/26/2020   Pneumococcal Conjugate-13 03/27/2014   Pneumococcal Polysaccharide-23 11/10/2009    Pneumococcal-Unspecified 03/12/2014   Tdap 03/24/2011, 09/25/2012   Zoster Recombinat (Shingrix) 09/01/2018, 02/19/2019   Zoster, Live 03/21/2010    Past Medical History:  Diagnosis Date   Adenocarcinoma of cecum ([place]) 06/21/2017   Arthritis    "mid back; hands; knees" (02/24/2015)   Basal cell carcinoma    left shoulder; mid chest; right eyelid (02/24/2015)   CAD (coronary artery disease)    a. 08/2014 Cath/PCI: LM nl, LAD 30p, D1 95 (2.25x12 Resolute Integrity DES), LCX small, RI 100 (attempted PCI) - branches fill via L->L collats, RCA dominant, nl, RPDA/PLA nl, EF 60^. b. 02/24/2015 PCI CTO of Ramus DES x2.   Chronic bronchitis (HCC)    hx   Diverticulosis 05/2005   Elevated lipase    Fuchs' corneal dystrophy    History of adenomatous polyp of colon 05/2005   8 mm  adenoma   History of blood transfusion    "related to some of my surgeries"   Hyperlipidemia    Hypertension    Iron deficiency anemia due to chronic blood loss 06/21/2017   Pneumonia    Prostate cancer ([place]) 2011   S/P seed implant   Sarcoidosis    Thrombocytopenia ([place])    a. Noted on prior labs, unclear of what w/u done.   Trifascicular block     Tobacco History: Social History   Tobacco Use  Smoking Status Light Smoker   Packs/day: 0.00   Years: 59.00   Pack years: 0.00   Types: Cigars, Cigarettes   Last attempt to quit: 02/24/2015   Years since quitting: 6.0  Smokeless Tobacco Never  Tobacco Comments   02/24/2015 "quit cigarettes ~ 2000 ago but smokes cigars once/month"verified 03/10/21. Once in a while.    Ready to quit: Not Answered Counseling given: Not Answered Tobacco comments: 02/24/2015 "quit cigarettes ~ 2000 ago but smokes cigars once/month"verified 03/10/21. Once in a while.    Continue to not smoke  Outpatient Encounter Medications as of 03/10/2021  Medication Sig   albuterol (VENTOLIN HFA) 108 (90 Base) MCG/ACT inhaler Inhale 1-2 puffs into the lungs every 6 (six) hours as needed for  wheezing or shortness of breath.   CANNABIDIOL PO Take 10 mg by mouth daily. CBD OIL   Cholecalciferol (VITAMIN D3) 2000 units capsule Take 2,000 Units by mouth every evening.    clobetasol (TEMOVATE) 0.05 % external solution Apply 1 application topically daily as needed (rash).   clopidogrel (PLAVIX) 75 MG tablet Take 1 tablet (75 mg total) by mouth daily.   clotrimazole (LOTRIMIN) 1 % cream Apply 1 application topically daily as needed (Rash).   fluticasone furoate-vilanterol (BREO ELLIPTA) 200-25 MCG/INH AEPB Inhale 1 puff into the lungs daily.   furosemide (LASIX) 20 MG tablet Take 20 mg by mouth.   gabapentin (NEURONTIN) 100 MG capsule Take 200 mg by mouth daily as needed (neuropathy).   magnesium oxide (MAG-OX) 400 MG tablet Take 400 mg by mouth daily.   naproxen sodium (ALEVE) 220 MG tablet Take 440 mg by mouth daily as needed (pain).   nitroGLYCERIN (NITROSTAT) 0.4 MG SL tablet PLACE 1 TABLET UNDER THE TONGUE EVERY 5 MINUTES AS NEEDED FOR CHEST PAIN FOR 3 DOSES   ranolazine (RANEXA) 500 MG 12 hr tablet Take 500 mg by mouth 2 (two) times daily.   rosuvastatin (CRESTOR) 20 MG tablet TAKE 1 TABLET BY MOUTH EVERY DAY   traMADol (ULTRAM) 50 MG tablet Take 50-100 mg by mouth every 6 (six) hours as needed for moderate pain or severe pain.   prochlorperazine (COMPAZINE) 10 MG tablet Take 1 tablet (10 mg total) by mouth every 6 (six) hours as needed.   No facility-administered encounter medications on file as of 03/10/2021.     Review of Systems  N/a  Physical Exam  BP (!) 150/78 (BP Location: Left Arm, Patient Position: Sitting, Cuff Size: Normal)   Pulse 78   Temp 98.2 F (36.8 C) (Oral)   Ht 5\' 10"  (1.778 m)   Wt 231 lb 6.4 oz (105 kg)   SpO2 98%   BMI 33.20 kg/m   Wt Readings from Last 5 Encounters:  03/10/21 231 lb 6.4 oz (105 kg)  03/08/21 229 lb (103.9 kg)  02/07/21 229 lb 12.8 oz (104.2 kg)  02/02/21 228 lb 3.2 oz (103.5 kg)  01/22/21 230 lb (104.3 kg)    BMI  Readings from Last  5 Encounters:  03/10/21 33.20 kg/m  03/08/21 32.86 kg/m  02/07/21 32.97 kg/m  02/02/21 32.74 kg/m  01/22/21 33.00 kg/m     Physical Exam General: Well-appearing, no acute distress Eyes: EOMI, no icterus Neck: No JVD supple Respiratory:L clear to auscultation bilaterally, no wheezes or crackles Cardiovascular: Regular rhythm, no murmurs Abdomen: Nondistended, bowel sounds present Psych: Normal mood, full affect   Assessment & Plan:   Dyspnea on exertion: Imaging and PFTs reviewed.  Suspect is multifactorial.  Related to burnt out sarcoid, moderate restriction.  Possible contribution of asthma.  Breo does not seem very helpful recently.  Improved after drainage of right pleural effusion.  Also element of deconditioning, exercising more and feeling a bit better.  Encouraged ongoing exercise.  Asthma: Presumed diagnosis given atopic symptoms and dyspnea on exertion.  Does not seem like [facility] has been very helpful.  He stopped it for a week without any worsening.  Resumed it for a week without any improvement.  Is going to need to using and keeping a journal this time.  If not very helpful okay to discontinue.  May need a different ICS/LABA inhaler.  Sarcoidosis: Burnt out, no evidence of active disease on recent imaging.  Return in about 4 months (around 07/10/2021).   Lanier Clam, MD 03/10/2021

## 2021-03-28 DIAGNOSIS — J90 Pleural effusion, not elsewhere classified: Secondary | ICD-10-CM | POA: Diagnosis not present

## 2021-03-28 DIAGNOSIS — Z8546 Personal history of malignant neoplasm of prostate: Secondary | ICD-10-CM | POA: Diagnosis not present

## 2021-03-28 DIAGNOSIS — Z9049 Acquired absence of other specified parts of digestive tract: Secondary | ICD-10-CM | POA: Diagnosis not present

## 2021-03-28 DIAGNOSIS — C787 Secondary malignant neoplasm of liver and intrahepatic bile duct: Secondary | ICD-10-CM | POA: Diagnosis not present

## 2021-03-28 DIAGNOSIS — Z79899 Other long term (current) drug therapy: Secondary | ICD-10-CM | POA: Diagnosis not present

## 2021-03-28 DIAGNOSIS — M549 Dorsalgia, unspecified: Secondary | ICD-10-CM | POA: Diagnosis not present

## 2021-03-28 DIAGNOSIS — C19 Malignant neoplasm of rectosigmoid junction: Secondary | ICD-10-CM | POA: Diagnosis not present

## 2021-03-28 DIAGNOSIS — C189 Malignant neoplasm of colon, unspecified: Secondary | ICD-10-CM | POA: Diagnosis not present

## 2021-03-28 NOTE — Telephone Encounter (Signed)
Dr. Hunsucker, please see mychart message sent by pt and advise. 

## 2021-03-28 NOTE — Telephone Encounter (Signed)
Routing back to Dr. Silas Flood so he can see new message from pt.

## 2021-03-28 NOTE — Telephone Encounter (Signed)
Pt stated that he will begin holding his plavix beginning Thursday. Will keep encounter open.

## 2021-03-29 NOTE — Telephone Encounter (Signed)
MH please see new message from pt.  He is wanting to have the procedure done this Thursday now.  Please advise. Thanks

## 2021-03-29 NOTE — Telephone Encounter (Signed)
New mychart message sent by pt. Pt stated if you need to call him to further discuss, you can. Routing to Dr. Silas Flood.

## 2021-03-29 NOTE — Telephone Encounter (Signed)
Discussed thoracentesis with patient via phone. Worsening DOE. CT report at [facility] 03/28/21 with enlarged pleural effusion compared to prior. He is on plavix. Discussed ideally would hold plavix x 5 days to minimize risk of bleeding. Will work to arrange thoracentesis 10/24. He was instructed to hold plavix starting 10/19. If symptoms worsen in the interim, he is to call us for further recommendations  Message routed to triage and procedure pool to assist in setting up thoracentesis Monday 10/24.

## 2021-04-04 ENCOUNTER — Telehealth: Payer: Self-pay | Admitting: Pulmonary Disease

## 2021-04-04 ENCOUNTER — Encounter (HOSPITAL_COMMUNITY): Payer: Self-pay | Admitting: Internal Medicine

## 2021-04-04 ENCOUNTER — Ambulatory Visit (HOSPITAL_COMMUNITY)
Admission: RE | Admit: 2021-04-04 | Discharge: 2021-04-04 | Disposition: A | Discharge status: Home / Self Care | Payer: Medicare PPO | Source: Other Acute Inpatient Hospital | Attending: Internal Medicine | Admitting: Internal Medicine

## 2021-04-04 ENCOUNTER — Encounter (HOSPITAL_COMMUNITY): Admission: RE | Disposition: A | Discharge status: Home / Self Care | Payer: Self-pay | Attending: Internal Medicine

## 2021-04-04 ENCOUNTER — Ambulatory Visit (HOSPITAL_COMMUNITY): Payer: Medicare PPO

## 2021-04-04 DIAGNOSIS — Z85038 Personal history of other malignant neoplasm of large intestine: Secondary | ICD-10-CM | POA: Diagnosis not present

## 2021-04-04 DIAGNOSIS — J948 Other specified pleural conditions: Secondary | ICD-10-CM | POA: Diagnosis not present

## 2021-04-04 DIAGNOSIS — Z9889 Other specified postprocedural states: Secondary | ICD-10-CM | POA: Diagnosis not present

## 2021-04-04 DIAGNOSIS — I517 Cardiomegaly: Secondary | ICD-10-CM | POA: Diagnosis not present

## 2021-04-04 DIAGNOSIS — J90 Pleural effusion, not elsewhere classified: Secondary | ICD-10-CM | POA: Diagnosis not present

## 2021-04-04 HISTORY — PX: THORACENTESIS: SHX235

## 2021-04-04 SURGERY — THORACENTESIS
Anesthesia: LOCAL | Laterality: Right

## 2021-04-04 NOTE — Procedures (Signed)
Thoracentesis  Procedure Note  TREYLAN MCCLINTOCK  [id number]  15-Oct-1940  Date:04/04/21  Time:3:25 PM   Provider Performing:Adib Wahba C Tamala Julian   Procedure: Thoracentesis with imaging guidance (68159)  Indication(s) Pleural Effusion  Consent Risks of the procedure as well as the alternatives and risks of each were explained to the patient and/or caregiver.  Consent for the procedure was obtained and is signed in the bedside chart  Anesthesia Topical only with 1% lidocaine    Time Out Verified patient identification, verified procedure, site/side was marked, verified correct patient position, special equipment/implants available, medications/allergies/relevant history reviewed, required imaging and test results available.   Sterile Technique Maximal sterile technique including full sterile barrier drape, hand hygiene, sterile gown, sterile gloves, mask, hair covering, sterile ultrasound probe cover (if used).  Procedure Description Ultrasound was used to identify appropriate pleural anatomy for placement and overlying skin marked.  Area of drainage cleaned and draped in sterile fashion. Lidocaine was used to anesthetize the skin and subcutaneous tissue.  1200 cc's of straw appearing fluid was drained from the right pleural space. Catheter then removed and bandaid applied to site.   Complications/Tolerance None; patient tolerated the procedure well. Chest X-ray is ordered to confirm no post-procedural complication.   EBL Minimal   Specimen(s) Pleural fluid

## 2021-04-04 NOTE — H&P (Addendum)
04/04/2021 CC: SOB  S:  80 year old presenting with recurrent symptomatic postop right pleural effusion.  Here for thoracentesis.  No complaints other than dyspnea.  Meds/PMH/PSH reviewed.  O: Blood pressure (!) 173/70, pulse 75, temperature 98 F (36.7 C), temperature source Temporal, resp. rate 16, height 5\' 10"  (1.778 m), weight 102.1 kg, SpO2 98 %.  No distress. Large effusion on [place]  A:  Post hepatectomy symptomatic pleural effusion with prior drainage Hx metastatic cecal cancer  P:  - Drain effusion, send for repeat cyto, f/u with Dr. Silas Flood - Patient consents   Erskine [name] MD [facility] [place] epic messenger for cross cover needs If after hours, please call E-link

## 2021-04-04 NOTE — Telephone Encounter (Signed)
Called and spoke with patient to let him know that I was able to get him scheduled for a Thora for today at [place] him to be at hospital between 2:30- 2:45. Patient expressed understanding. Nothing further needed at this time.

## 2021-04-04 NOTE — Telephone Encounter (Signed)
Called and spoke with patient to let him know that I was able to get him scheduled for a Thora for today at [place] him to be at hospital between 2:30- 2:45. Patient expressed understanding. Nothing further needed at this time.

## 2021-04-04 NOTE — Telephone Encounter (Signed)
Called and spoke with patient to let him know that I was able to get him scheduled for a Thora for today at [place] him to be at hospital between 2:30- 2:45. Patient expressed understanding. Nothing further needed at this time.

## 2021-04-04 NOTE — Discharge Instructions (Signed)
Resume Plavix today. Continue all other meds as directed.

## 2021-04-05 ENCOUNTER — Encounter (HOSPITAL_COMMUNITY): Payer: Self-pay | Admitting: Internal Medicine

## 2021-04-05 LAB — CYTOLOGY - NON PAP

## 2021-04-19 ENCOUNTER — Telehealth: Payer: Self-pay | Admitting: *Deleted

## 2021-04-19 NOTE — Telephone Encounter (Signed)
Luis [name] called to report he does not need port flushed on 11/14--was done at [facility] w/his Intera pump. Returns to [facility] 12/12 for both pumps to be flushed and CT scan. Asking if in future, Dr. Benay Spice could do scans locally. Informed him it would require his physician at [facility] to communicate with Dr. Benay Spice.

## 2021-04-25 ENCOUNTER — Inpatient Hospital Stay: Payer: Medicare PPO | Admitting: Oncology

## 2021-04-25 ENCOUNTER — Inpatient Hospital Stay: Payer: Medicare PPO

## 2021-05-09 DIAGNOSIS — L578 Other skin changes due to chronic exposure to nonionizing radiation: Secondary | ICD-10-CM | POA: Diagnosis not present

## 2021-05-09 DIAGNOSIS — C44311 Basal cell carcinoma of skin of nose: Secondary | ICD-10-CM | POA: Diagnosis not present

## 2021-05-09 DIAGNOSIS — L821 Other seborrheic keratosis: Secondary | ICD-10-CM | POA: Diagnosis not present

## 2021-05-11 ENCOUNTER — Other Ambulatory Visit: Payer: Self-pay | Admitting: Interventional Cardiology

## 2021-05-16 DIAGNOSIS — C787 Secondary malignant neoplasm of liver and intrahepatic bile duct: Secondary | ICD-10-CM | POA: Diagnosis not present

## 2021-05-16 DIAGNOSIS — J90 Pleural effusion, not elsewhere classified: Secondary | ICD-10-CM | POA: Diagnosis not present

## 2021-05-16 DIAGNOSIS — J9811 Atelectasis: Secondary | ICD-10-CM | POA: Diagnosis not present

## 2021-05-16 DIAGNOSIS — C189 Malignant neoplasm of colon, unspecified: Secondary | ICD-10-CM | POA: Diagnosis not present

## 2021-05-17 ENCOUNTER — Other Ambulatory Visit: Payer: Self-pay

## 2021-05-17 ENCOUNTER — Ambulatory Visit (HOSPITAL_COMMUNITY)
Admission: RE | Admit: 2021-05-17 | Discharge: 2021-05-17 | Disposition: A | Discharge status: Home / Self Care | Payer: Medicare PPO | Source: Ambulatory Visit | Attending: Internal Medicine | Admitting: Internal Medicine

## 2021-05-17 DIAGNOSIS — I6523 Occlusion and stenosis of bilateral carotid arteries: Secondary | ICD-10-CM | POA: Diagnosis not present

## 2021-05-18 ENCOUNTER — Encounter: Payer: Self-pay | Admitting: Pulmonary Disease

## 2021-05-19 ENCOUNTER — Encounter: Payer: Self-pay | Admitting: *Deleted

## 2021-05-19 DIAGNOSIS — Z1331 Encounter for screening for depression: Secondary | ICD-10-CM | POA: Diagnosis not present

## 2021-05-19 DIAGNOSIS — Z9181 History of falling: Secondary | ICD-10-CM | POA: Diagnosis not present

## 2021-05-19 DIAGNOSIS — E785 Hyperlipidemia, unspecified: Secondary | ICD-10-CM | POA: Diagnosis not present

## 2021-05-19 DIAGNOSIS — E669 Obesity, unspecified: Secondary | ICD-10-CM | POA: Diagnosis not present

## 2021-05-19 DIAGNOSIS — Z0000 Encounter for general adult medical examination without abnormal findings: Secondary | ICD-10-CM | POA: Diagnosis not present

## 2021-05-19 NOTE — Telephone Encounter (Signed)
Patient wanted to let Dr. Silas Flood that he completed his MRI and CT at [facility] on 05/16/21. I was able to see the reports in Care Everywhere   MH, can you please advise? Thanks!

## 2021-05-23 DIAGNOSIS — Z87891 Personal history of nicotine dependence: Secondary | ICD-10-CM | POA: Diagnosis not present

## 2021-05-23 DIAGNOSIS — K769 Liver disease, unspecified: Secondary | ICD-10-CM | POA: Diagnosis not present

## 2021-05-23 DIAGNOSIS — Z923 Personal history of irradiation: Secondary | ICD-10-CM | POA: Diagnosis not present

## 2021-05-23 DIAGNOSIS — K5730 Diverticulosis of large intestine without perforation or abscess without bleeding: Secondary | ICD-10-CM | POA: Diagnosis not present

## 2021-05-23 DIAGNOSIS — Z9049 Acquired absence of other specified parts of digestive tract: Secondary | ICD-10-CM | POA: Diagnosis not present

## 2021-05-23 DIAGNOSIS — C189 Malignant neoplasm of colon, unspecified: Secondary | ICD-10-CM | POA: Diagnosis not present

## 2021-05-23 DIAGNOSIS — C180 Malignant neoplasm of cecum: Secondary | ICD-10-CM | POA: Diagnosis not present

## 2021-05-23 DIAGNOSIS — C787 Secondary malignant neoplasm of liver and intrahepatic bile duct: Secondary | ICD-10-CM | POA: Diagnosis not present

## 2021-05-23 DIAGNOSIS — Z79899 Other long term (current) drug therapy: Secondary | ICD-10-CM | POA: Diagnosis not present

## 2021-05-24 ENCOUNTER — Other Ambulatory Visit: Payer: Self-pay | Admitting: *Deleted

## 2021-05-24 DIAGNOSIS — I6521 Occlusion and stenosis of right carotid artery: Secondary | ICD-10-CM

## 2021-05-28 ENCOUNTER — Other Ambulatory Visit: Payer: Self-pay

## 2021-05-28 ENCOUNTER — Emergency Department (HOSPITAL_COMMUNITY): Payer: Medicare PPO

## 2021-05-28 ENCOUNTER — Encounter (HOSPITAL_COMMUNITY): Payer: Self-pay

## 2021-05-28 ENCOUNTER — Observation Stay (HOSPITAL_COMMUNITY)
Admission: EM | Admit: 2021-05-28 | Discharge: 2021-05-29 | Disposition: A | Discharge status: Home / Self Care | Payer: Medicare PPO | Attending: General Surgery | Admitting: General Surgery

## 2021-05-28 DIAGNOSIS — Z8546 Personal history of malignant neoplasm of prostate: Secondary | ICD-10-CM | POA: Diagnosis not present

## 2021-05-28 DIAGNOSIS — S0990XA Unspecified injury of head, initial encounter: Secondary | ICD-10-CM | POA: Diagnosis not present

## 2021-05-28 DIAGNOSIS — W19XXXA Unspecified fall, initial encounter: Secondary | ICD-10-CM | POA: Diagnosis present

## 2021-05-28 DIAGNOSIS — I10 Essential (primary) hypertension: Secondary | ICD-10-CM | POA: Diagnosis not present

## 2021-05-28 DIAGNOSIS — Z79899 Other long term (current) drug therapy: Secondary | ICD-10-CM | POA: Diagnosis not present

## 2021-05-28 DIAGNOSIS — S01112A Laceration without foreign body of left eyelid and periocular area, initial encounter: Secondary | ICD-10-CM | POA: Diagnosis not present

## 2021-05-28 DIAGNOSIS — R220 Localized swelling, mass and lump, head: Secondary | ICD-10-CM | POA: Diagnosis not present

## 2021-05-28 DIAGNOSIS — S3991XA Unspecified injury of abdomen, initial encounter: Secondary | ICD-10-CM | POA: Diagnosis not present

## 2021-05-28 DIAGNOSIS — J90 Pleural effusion, not elsewhere classified: Secondary | ICD-10-CM | POA: Diagnosis not present

## 2021-05-28 DIAGNOSIS — S0181XA Laceration without foreign body of other part of head, initial encounter: Secondary | ICD-10-CM | POA: Diagnosis not present

## 2021-05-28 DIAGNOSIS — Z7902 Long term (current) use of antithrombotics/antiplatelets: Secondary | ICD-10-CM | POA: Insufficient documentation

## 2021-05-28 DIAGNOSIS — I2510 Atherosclerotic heart disease of native coronary artery without angina pectoris: Secondary | ICD-10-CM | POA: Diagnosis not present

## 2021-05-28 DIAGNOSIS — K5730 Diverticulosis of large intestine without perforation or abscess without bleeding: Secondary | ICD-10-CM | POA: Diagnosis not present

## 2021-05-28 DIAGNOSIS — I700 Atherosclerosis of aorta: Secondary | ICD-10-CM | POA: Diagnosis not present

## 2021-05-28 DIAGNOSIS — S29001A Unspecified injury of muscle and tendon of front wall of thorax, initial encounter: Secondary | ICD-10-CM | POA: Diagnosis not present

## 2021-05-28 DIAGNOSIS — F17210 Nicotine dependence, cigarettes, uncomplicated: Secondary | ICD-10-CM | POA: Insufficient documentation

## 2021-05-28 DIAGNOSIS — Z23 Encounter for immunization: Secondary | ICD-10-CM | POA: Insufficient documentation

## 2021-05-28 DIAGNOSIS — K769 Liver disease, unspecified: Secondary | ICD-10-CM | POA: Diagnosis not present

## 2021-05-28 DIAGNOSIS — S20219A Contusion of unspecified front wall of thorax, initial encounter: Secondary | ICD-10-CM | POA: Diagnosis not present

## 2021-05-28 DIAGNOSIS — Z20822 Contact with and (suspected) exposure to covid-19: Secondary | ICD-10-CM | POA: Insufficient documentation

## 2021-05-28 DIAGNOSIS — Z85828 Personal history of other malignant neoplasm of skin: Secondary | ICD-10-CM | POA: Insufficient documentation

## 2021-05-28 DIAGNOSIS — R519 Headache, unspecified: Secondary | ICD-10-CM | POA: Diagnosis not present

## 2021-05-28 DIAGNOSIS — Z955 Presence of coronary angioplasty implant and graft: Secondary | ICD-10-CM | POA: Insufficient documentation

## 2021-05-28 DIAGNOSIS — W109XXA Fall (on) (from) unspecified stairs and steps, initial encounter: Secondary | ICD-10-CM | POA: Diagnosis not present

## 2021-05-28 DIAGNOSIS — M542 Cervicalgia: Secondary | ICD-10-CM | POA: Diagnosis not present

## 2021-05-28 DIAGNOSIS — K4020 Bilateral inguinal hernia, without obstruction or gangrene, not specified as recurrent: Secondary | ICD-10-CM | POA: Diagnosis not present

## 2021-05-28 DIAGNOSIS — I898 Other specified noninfective disorders of lymphatic vessels and lymph nodes: Secondary | ICD-10-CM | POA: Diagnosis not present

## 2021-05-28 DIAGNOSIS — R911 Solitary pulmonary nodule: Secondary | ICD-10-CM | POA: Diagnosis not present

## 2021-05-28 DIAGNOSIS — Z452 Encounter for adjustment and management of vascular access device: Secondary | ICD-10-CM | POA: Diagnosis not present

## 2021-05-28 LAB — I-STAT CHEM 8, ED
BUN: 18 mg/dL (ref 8–23)
Calcium, Ion: 1.16 mmol/L (ref 1.15–1.40)
Chloride: 103 mmol/L (ref 98–111)
Creatinine, Ser: 0.8 mg/dL (ref 0.61–1.24)
Glucose, Bld: 130 mg/dL — ABNORMAL HIGH (ref 70–99)
HCT: 36 % — ABNORMAL LOW (ref 39.0–52.0)
Hemoglobin: 12.2 g/dL — ABNORMAL LOW (ref 13.0–17.0)
Potassium: 4.1 mmol/L (ref 3.5–5.1)
Sodium: 138 mmol/L (ref 135–145)
TCO2: 28 mmol/L (ref 22–32)

## 2021-05-28 LAB — I-STAT BETA HCG BLOOD, ED (MC, WL, AP ONLY): I-stat hCG, quantitative: <5 m[IU]/mL (ref <5)

## 2021-05-28 MED ORDER — IOHEXOL 350 MG/ML SOLN
80.0000 mL | Freq: Once | INTRAVENOUS | Status: AC | PRN
  Administered 2021-05-28: 80 mL via INTRAVENOUS

## 2021-05-28 MED ORDER — TETANUS-DIPHTH-ACELL PERTUSSIS 5-2.5-18.5 LF-MCG/0.5 IM SUSY
0.5000 mL | PREFILLED_SYRINGE | Freq: Once | INTRAMUSCULAR | Status: AC
  Administered 2021-05-28: 0.5 mL via INTRAMUSCULAR
  Filled 2021-05-28: qty 0.5

## 2021-05-28 MED ORDER — LIDOCAINE-EPINEPHRINE 2 %-1:100000 IJ SOLN
30.0000 mL | Freq: Once | INTRAMUSCULAR | Status: AC
  Administered 2021-05-28: 30 mL via INTRADERMAL
  Filled 2021-05-28: qty 2

## 2021-05-28 MED ORDER — TRAMADOL HCL 50 MG PO TABS
50.0000 mg | ORAL_TABLET | Freq: Once | ORAL | Status: AC
Start: 2021-05-28 — End: 2021-05-28
  Administered 2021-05-28: 50 mg via ORAL
  Filled 2021-05-28: qty 1

## 2021-05-28 NOTE — ED Triage Notes (Signed)
Patient BIB [facility] , fell today, hit head. Small laceration above left eyebrow. Denies neck/back pain, denies LOC. Endorses left flank pain. Pain rated 4/10. Patient takes plavix. Vitals WNL with EMS.

## 2021-05-28 NOTE — H&P (Signed)
Luis [name] is an 80 y.o. male.   Chief Complaint: fall HPI: 46 yom s/[facility] fall at [facility]. He fell in dark onto a dinosaur and hit his head and chest. Complains of left chest pain especially when breathing and soreness at left scalp.  He is here with his wife. Has undergone imaging and has small anterior med hematoma. No abnormality on ekg monitor, has prior cad with stents. On plavix.  Has stage IV CRC treated with chemotherapy followed by metastatectomy and ablation, has HAI pump in place, due to begin radiotherapy soon at [place].   Past Medical History:  Diagnosis Date   Adenocarcinoma of cecum ([place]) 06/21/2017   Arthritis    "mid back; hands; knees" (02/24/2015)   Basal cell carcinoma    left shoulder; mid chest; right eyelid (02/24/2015)   CAD (coronary artery disease)    a. 08/2014 Cath/PCI: LM nl, LAD 30p, D1 95 (2.25x12 Resolute Integrity DES), LCX small, RI 100 (attempted PCI) - branches fill via L->L collats, RCA dominant, nl, RPDA/PLA nl, EF 60^. b. 02/24/2015 PCI CTO of Ramus DES x2.   Chronic bronchitis ([place])    hx   Diverticulosis 05/2005   Elevated lipase    Fuchs' corneal dystrophy    History of adenomatous polyp of colon 05/2005   8 mm adenoma   History of blood transfusion    "related to some of my surgeries"   Hyperlipidemia    Hypertension    Iron deficiency anemia due to chronic blood loss 06/21/2017   Pneumonia    Prostate cancer ([place]) 2011   S/P seed implant   Sarcoidosis    Thrombocytopenia ([employer])    a. Noted on prior labs, unclear of what w/u done.   Trifascicular block     Past Surgical History:  Procedure Laterality Date   BASAL CELL CARCINOMA EXCISION Left    shoulder   CARDIAC CATHETERIZATION N/A 02/24/2015   Procedure: Coronary/Bypass Graft CTO Intervention;  Surgeon: Jettie Booze, MD;  Location: [facility];  Service: Cardiovascular;  Laterality: N/A;   CARDIAC CATHETERIZATION  02/24/2015   Procedure: Coronary/Graft Atherectomy;   Surgeon: Jettie Booze, MD;  Location: [facility];  Service: Cardiovascular;;   CATARACT EXTRACTION W/ INTRAOCULAR LENS  IMPLANT, BILATERAL Bilateral ~ 2011-2012   COLONOSCOPY W/ POLYPECTOMY  06/08/2005 and 10/18/2010   8 mm adenoma 2006, none 2012. Diverticulosis and internal hemorrhoids.   CORNEAL TRANSPLANT Bilateral ~ 2011-2012   "@ same time as cataract OR"   CORONARY ANGIOPLASTY WITH STENT PLACEMENT  08/2014; 02/24/2015   "1 stent + 1 stent"   CORONARY STENT INTERVENTION N/A 01/02/2019   Procedure: CORONARY STENT INTERVENTION;  Surgeon: Jettie Booze, MD;  Location: [facility];  Service: Cardiovascular;  Laterality: N/A;   EYE SURGERY     FINGER SURGERY Right 2014   "reattached middle finger"   INSERTION PROSTATE RADIATION SEED  ~ 2011   IR IMAGING GUIDED PORT INSERTION  03/04/2019   IR RADIOLOGIST EVAL & MGMT  05/26/2020   IR US GUIDE BX ASP/DRAIN  03/04/2019   LAPAROSCOPIC CHOLECYSTECTOMY  2015   LAPAROSCOPIC PARTIAL COLECTOMY N/A 07/26/2017   Procedure: LAPAROSCOPIC PARTIAL COLECTOMY, EXCISION SCROTAL CYST;  Surgeon: Greer Pickerel, MD;  Location: [facility];  Service: General;  Laterality: N/A;   LEFT HEART CATH AND CORONARY ANGIOGRAPHY N/A 01/02/2019   Procedure: LEFT HEART CATH AND CORONARY ANGIOGRAPHY;  Surgeon: Jettie Booze, MD;  Location: [facility];  Service: Cardiovascular;  Laterality: N/A;   LEFT HEART CATHETERIZATION WITH CORONARY ANGIOGRAM N/A 08/12/2014   Procedure: LEFT HEART CATHETERIZATION WITH CORONARY ANGIOGRAM;  Surgeon: Blane Ohara, MD;  Location: [facility] C[facility] LAB;  Service: Cardiovascular;  Laterality: N/A;   LYMPH NODE BIOPSY     mid chest   MOHS SURGERY Right ~ 2011   "eyelid; for basal cell"   RADIOLOGY WITH ANESTHESIA N/A 06/30/2020   Procedure: IR WITH ANESTHESIA MICROWAVE ABLATION;  Surgeon: Corrie Mckusick, DO;  Location: [facility];  Service: Anesthesiology;  Laterality: N/A;   THORACENTESIS N/A 02/11/2021   Procedure:  Mathews Robinsons;  Surgeon: Lanier Clam, MD;  Location: [facility] ENDOSCOPY;  Service: Pulmonary;  Laterality: N/A;   THORACENTESIS Right 04/04/2021   Procedure: THORACENTESIS;  Surgeon: Candee Furbish, MD;  Location: [facility] ENDOSCOPY;  Service: Pulmonary;  Laterality: Right;   THOROCOTOMY WITH LOBECTOMY Right ~ 1991   partial removal right lung   TONSILLECTOMY  ~ 1950    Family History  Problem Relation Age of Onset   Heart disease Father 71       Died from "hardening of the arteries" age 4   Coronary artery disease Sister 28   Heart attack Sister    Colon cancer Neg Hx    Throat cancer Neg Hx    Pancreatic cancer Neg Hx    Prostate cancer Neg Hx    Stroke Neg Hx    Hypertension Neg Hx    Social History:  reports that he has been smoking cigars and cigarettes. He has never used smokeless tobacco. He reports current alcohol use of about 2.0 standard drinks per week. He reports that he does not use drugs.  Allergies: No Known Allergies  No current facility-administered medications on file prior to encounter.   Current Outpatient Medications on File Prior to Encounter  Medication Sig Dispense Refill   albuterol (VENTOLIN HFA) 108 (90 Base) MCG/ACT inhaler Inhale 1-2 puffs into the lungs every 6 (six) hours as needed for wheezing or shortness of breath. 1 each 0   CANNABIDIOL PO Take 10 mg by mouth daily. CBD OIL     Cholecalciferol (VITAMIN D3) 2000 units capsule Take 2,000 Units by mouth every evening.      clobetasol (TEMOVATE) 0.05 % external solution Apply 1 application topically daily as needed (rash).     clopidogrel (PLAVIX) 75 MG tablet Take 1 tablet (75 mg total) by mouth daily. 90 tablet 3   clotrimazole (LOTRIMIN) 1 % cream Apply 1 application topically daily as needed (Rash).     fluticasone furoate-vilanterol (BREO ELLIPTA) 200-25 MCG/INH AEPB Inhale 1 puff into the lungs daily. 180 each 11   furosemide (LASIX) 20 MG tablet Take 20 mg by mouth.     gabapentin (NEURONTIN)  100 MG capsule Take 200 mg by mouth daily as needed (neuropathy).     magnesium oxide (MAG-OX) 400 MG tablet Take 400 mg by mouth daily.     naproxen sodium (ALEVE) 220 MG tablet Take 440 mg by mouth daily as needed (pain).     nitroGLYCERIN (NITROSTAT) 0.4 MG SL tablet PLACE 1 TABLET UNDER THE TONGUE EVERY 5 MINUTES AS NEEDED FOR CHEST PAIN FOR 3 DOSES 25 tablet 3   prochlorperazine (COMPAZINE) 10 MG tablet Take 1 tablet (10 mg total) by mouth every 6 (six) hours as needed. 60 tablet 1   ranolazine (RANEXA) 500 MG 12 hr tablet Take 500 mg by mouth 2 (two) times daily.     rosuvastatin (CRESTOR)  20 MG tablet TAKE 1 TABLET BY MOUTH EVERY DAY 90 tablet 2   traMADol (ULTRAM) 50 MG tablet Take 50-100 mg by mouth every 6 (six) hours as needed for moderate pain or severe pain.        Results for orders placed or performed during the hospital encounter of 05/28/21 (from the past 48 hour(s))  I-Stat beta hCG blood, ED ([facility], [facility], [facility] only)     Status: None   Collection Time: 05/28/21  9:30 PM  Result Value Ref Range   I-stat hCG, quantitative <5.0 <5 mIU/mL   Comment 3            Comment:   GEST. AGE      CONC.  (mIU/mL)   <=1 WEEK        5 - 50     2 WEEKS       50 - 500     3 WEEKS       100 - 10,000     4 WEEKS     1,000 - 30,000        MALE AND NON-PREGNANT MALE:     LESS THAN 5 mIU/mL   I-stat chem 8, ED (not at [facility] or [facility])     Status: Abnormal   Collection Time: 05/28/21  9:43 PM  Result Value Ref Range   Sodium 138 135 - 145 mmol/L   Potassium 4.1 3.5 - 5.1 mmol/L   Chloride 103 98 - 111 mmol/L   BUN 18 8 - 23 mg/dL   Creatinine, Ser 0.80 0.61 - 1.24 mg/dL   Glucose, Bld 130 (H) 70 - 99 mg/dL    Comment: Glucose reference range applies only to samples taken after fasting for at least 8 hours.   Calcium, Ion 1.16 1.15 - 1.40 mmol/L   TCO2 28 22 - 32 mmol/L   Hemoglobin 12.2 (L) 13.0 - 17.0 g/dL   HCT 36.0 (L) 39.0 - 52.0 %   DG Ribs Unilateral W/Chest Left  Result Date:  05/28/2021 CLINICAL DATA:  Fall today.  Complains of anterior left rib pain. EXAM: LEFT RIBS AND CHEST - 3+ VIEW COMPARISON:  Chest radiograph 04/04/2021; CT chest 01/22/2021 FINDINGS: No fracture or other bone lesions are seen involving the ribs. There is no evidence of pneumothorax or pleural effusion. Surgical staple seen in the right mid and lower lung. Chronic small right pleural effusion. The lungs are otherwise clear. Multiple calcified mediastinal lymph nodes, stable. Stable cardiomediastinal silhouette. Aortic calcifications. Power injectable right IJ central venous catheter tip projects at the level of the mid SVC. IMPRESSION: 1. No rib fracture. 2. Chronic small right pleural effusion. No new focal consolidation. 3.  Aortic Atherosclerosis (ICD10-I70.0). Electronically Signed   By: Ileana Roup M.D.   On: 05/28/2021 19:36   CT Head Wo Contrast  Result Date: 05/28/2021 CLINICAL DATA:  Recent trip and fall with headaches and neck pain, initial encounter EXAM: CT HEAD WITHOUT CONTRAST CT MAXILLOFACIAL WITHOUT CONTRAST CT CERVICAL SPINE WITHOUT CONTRAST TECHNIQUE: Multidetector CT imaging of the head, cervical spine, and maxillofacial structures were performed using the standard protocol without intravenous contrast. Multiplanar CT image reconstructions of the cervical spine and maxillofacial structures were also generated. COMPARISON:  None. FINDINGS: CT HEAD FINDINGS Brain: No evidence of acute infarction, hemorrhage, hydrocephalus, extra-axial collection or mass lesion/mass effect. Chronic atrophic and ischemic changes are noted. Vascular: No hyperdense vessel or unexpected calcification. Skull: Normal. Negative for fracture or focal lesion. Other: Scalp swelling is noted  in the left supraorbital region laterally. CT MAXILLOFACIAL FINDINGS Osseous: No acute bony abnormality is noted. Orbits: Orbits and their contents are within normal limits. Sinuses: Paranasal sinuses are clear. Soft tissues: Left  periorbital region laterally and extending superiorly consistent with the recent injury. No other soft tissue abnormality is noted. CT CERVICAL SPINE FINDINGS Alignment: Within normal limits. Skull base and vertebrae: 7 cervical segments are well visualized. 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 Facet hypertrophic changes are noted. Varied levels of fusion are noted from C6-T2. Posterior fusion defect is noted at C4. No acute fracture or acute facet abnormality is noted. Soft tissues and spinal canal: Right chest wall port is noted. Surrounding soft tissue structures demonstrate vascular calcifications. No hematoma or mass lesion is seen. Upper chest: Visualized lung apices demonstrate scarring in the right apex similar to that seen on prior CT from August of 2022. Other: None IMPRESSION: CT of the head: No acute intracranial abnormality noted. CT of the maxillofacial bones: No acute fracture is seen. Soft tissue swelling in the left periorbital region and left scalp is seen. 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 Electronically Signed   By: Inez Catalina M.D.   On: 05/28/2021 19:51   CT Chest W Contrast  Result Date: 05/28/2021 CLINICAL DATA:  Trauma. Fall. History of metastatic colorectal cancer. EXAM: CT CHEST WITH CONTRAST TECHNIQUE: Multidetector CT imaging of the chest was performed during intravenous contrast administration. CONTRAST:  59mL OMNIPAQUE IOHEXOL 350 MG/ML SOLN COMPARISON:  CT chest 01/22/2021. FINDINGS: Cardiovascular: Heart is mildly enlarged. There is no pericardial effusion. Aorta is normal in size. There are atherosclerotic calcifications of the aorta and coronary arteries. Central venous catheter tip ends at the cavoatrial junction. Mediastinum/Nodes: Calcified nodule in the right thyroid gland measures 6 mm and is unchanged. There are  diffuse calcified mediastinal and hilar lymph nodes similar to the prior study. Esophagus is nondilated. There is stranding and heterogeneous hyperdensity in the anterior left mediastinum at the level of the heart concerning for mediastinal hematoma. No pneumomediastinum. Lungs/Pleura: There is a small to moderate-sized right pleural effusion which is likely partially loculated, similar to the prior examination. No pneumothorax. Right upper lobe parenchymal opacities and retraction appear similar to the prior examination. Surgical staple line in the right upper hemithorax is unchanged. Linear areas of scarring in the left upper lobe are similar to the prior study. Tree-in-bud opacities in the inferior left upper lobe are also similar from the prior examination. There are bilateral calcified nodules, unchanged. Trachea and central airways are patent. Musculoskeletal: No chest wall abnormality. No acute or significant osseous findings. IMPRESSION: 1. Small amount of anterior mediastinal hematoma at the level of the heart. No acute fractures are seen. 2. Stable loculated right pleural effusion. Stable chronic changes in both lungs. No new focal lung infiltrate. Electronically Signed   By: Ronney Asters M.D.   On: 05/28/2021 22:40   CT Cervical Spine Wo Contrast  Result Date: 05/28/2021 CLINICAL DATA:  Recent trip and fall with headaches and neck pain, initial encounter EXAM: CT HEAD WITHOUT CONTRAST CT MAXILLOFACIAL WITHOUT CONTRAST CT CERVICAL SPINE WITHOUT CONTRAST TECHNIQUE: Multidetector CT imaging of the head, cervical spine, and maxillofacial structures were performed using the standard protocol without intravenous contrast. Multiplanar CT image reconstructions of the cervical spine and maxillofacial structures were also generated.  COMPARISON:  None. FINDINGS: CT HEAD FINDINGS Brain: No evidence of acute infarction, hemorrhage, hydrocephalus, extra-axial collection or mass lesion/mass effect. Chronic  atrophic and ischemic changes are noted. Vascular: No hyperdense vessel or unexpected calcification. Skull: Normal. Negative for fracture or focal lesion. Other: Scalp swelling is noted in the left supraorbital region laterally. CT MAXILLOFACIAL FINDINGS Osseous: No acute bony abnormality is noted. Orbits: Orbits and their contents are within normal limits. Sinuses: Paranasal sinuses are clear. Soft tissues: Left periorbital region laterally and extending superiorly consistent with the recent injury. No other soft tissue abnormality is noted. CT CERVICAL SPINE FINDINGS Alignment: Within normal limits. Skull base and vertebrae: 7 cervical segments are well visualized. 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 Facet hypertrophic changes are noted. Varied levels of fusion are noted from C6-T2. Posterior fusion defect is noted at C4. No acute fracture or acute facet abnormality is noted. Soft tissues and spinal canal: Right chest wall port is noted. Surrounding soft tissue structures demonstrate vascular calcifications. No hematoma or mass lesion is seen. Upper chest: Visualized lung apices demonstrate scarring in the right apex similar to that seen on prior CT from August of 2022. Other: None IMPRESSION: CT of the head: No acute intracranial abnormality noted. CT of the maxillofacial bones: No acute fracture is seen. Soft tissue swelling in the left periorbital region and left scalp is seen. 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 Electronically Signed   By: Inez Catalina M.D.   On: 05/28/2021 19:51   CT ABDOMEN PELVIS W CONTRAST  Result Date: 05/28/2021 CLINICAL DATA:  Blunt abdominal trauma, left flank pain. Metastatic colorectal cancer. EXAM: CT ABDOMEN AND PELVIS WITH CONTRAST TECHNIQUE: Multidetector CT imaging of the abdomen and pelvis was performed using the  standard protocol following bolus administration of intravenous contrast. CONTRAST:  15mL OMNIPAQUE IOHEXOL 350 MG/ML SOLN COMPARISON:  04/26/2020, 06/30/2020, CT chest 01/22/2021 FINDINGS: Lower chest: Moderate right pleural effusion is present, unchanged from prior examination. Right basilar wedge resection x2 has been performed with stable resultant right-sided volume loss. Scarring within the right upper lobe is unchanged. Numerous subcentimeter nodules within the superior segment of the a right lower lobe appear grossly unchanged from prior examination. Extensive coronary artery stenting has been performed. Global cardiac size within normal limits. No pericardial effusion. There is infiltration of the pericardial fat at the left lung base most in keeping with interstitial hemorrhage without significant mass effect upon the cardiac structures. No mass forming hematoma identified. Hepatobiliary: Metastatic lesion within the right hepatic lobe appears smaller than on prior pre ablation imaging of 06/30/2020, now measuring 2.5 x 3.7 cm at axial image # 57/2. Fiducial markers and post ablated changes are noted involving the inferior right hepatic lobe. Lesion within the subserosal left hepatic lobe appears smaller, measuring 1.0 x 2.8 cm at axial image # 50/2 with retraction of the hepatic capsule. Cholecystectomy has been performed. Pancreas: Unremarkable Spleen: Unremarkable Adrenals/Urinary Tract: Adrenal glands are unremarkable. Kidneys are normal, without renal calculi, focal lesion, or hydronephrosis. Bladder is unremarkable. Stomach/Bowel: Severe descending and sigmoid colonic diverticulosis without superimposed acute inflammatory change. Right hemicolectomy has been performed. No evidence of obstruction or focal inflammation. Stomach, small bowel, and relate to ol rib residual large bowel are unremarkable. No free intraperitoneal gas or fluid. Implanted intraperitoneal port catheter is seen with the catheter  tip within the periportal region. Vascular/Lymphatic: Aortic atherosclerosis. No enlarged abdominal or pelvic lymph nodes. Reproductive: Brachytherapy seeds are seen within the prostate gland. Other: Tiny fat containing bilateral inguinal hernias are present. Musculoskeletal: No acute bone abnormality. IMPRESSION: Infiltration of the pericardial fat at the left lung base most in keeping with interstitial hemorrhage related to acute trauma. No mass forming hematoma identified. No significant mass effect upon the cardiac structures. No definite fracture is clearly identified. No acute intra-abdominal pathology identified. Interval response to therapy with post ablated changes involving the inferior right hepatic lesion and interval decrease in size within the superior right and left hepatic metastatic masses. Distal colonic diverticulosis without acute inflammatory change. Aortic Atherosclerosis (ICD10-I70.0). Electronically Signed   By: Fidela Salisbury M.D.   On: 05/28/2021 22:50   CT Maxillofacial Wo Contrast  Result Date: 05/28/2021 CLINICAL DATA:  Recent trip and fall with headaches and neck pain, initial encounter EXAM: CT HEAD WITHOUT CONTRAST CT MAXILLOFACIAL WITHOUT CONTRAST CT CERVICAL SPINE WITHOUT CONTRAST TECHNIQUE: Multidetector CT imaging of the head, cervical spine, and maxillofacial structures were performed using the standard protocol without intravenous contrast. Multiplanar CT image reconstructions of the cervical spine and maxillofacial structures were also generated. COMPARISON:  None. FINDINGS: CT HEAD FINDINGS Brain: No evidence of acute infarction, hemorrhage, hydrocephalus, extra-axial collection or mass lesion/mass effect. Chronic atrophic and ischemic changes are noted. Vascular: No hyperdense vessel or unexpected calcification. Skull: Normal. Negative for fracture or focal lesion. Other: Scalp swelling is noted in the left supraorbital region laterally. CT MAXILLOFACIAL FINDINGS  Osseous: No acute bony abnormality is noted. Orbits: Orbits and their contents are within normal limits. Sinuses: Paranasal sinuses are clear. Soft tissues: Left periorbital region laterally and extending superiorly consistent with the recent injury. No other soft tissue abnormality is noted. CT CERVICAL SPINE FINDINGS Alignment: Within normal limits. Skull base and vertebrae: 7 cervical segments are well visualized. 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 Facet hypertrophic changes are noted. Varied levels of fusion are noted from C6-T2. Posterior fusion defect is noted at C4. No acute fracture or acute facet abnormality is noted. Soft tissues and spinal canal: Right chest wall port is noted. Surrounding soft tissue structures demonstrate vascular calcifications. No hematoma or mass lesion is seen. Upper chest: Visualized lung apices demonstrate scarring in the right apex similar to that seen on prior CT from August of 2022. Other: None IMPRESSION: CT of the head: No acute intracranial abnormality noted. CT of the maxillofacial bones: No acute fracture is seen. Soft tissue swelling in the left periorbital region and left scalp is seen. 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 Electronically Signed   By: Inez Catalina M.D.   On: 05/28/2021 19:51    Review of Systems  Cardiovascular:  Positive for chest pain (left side at site of fall).  Gastrointestinal:  Negative for abdominal pain.  Musculoskeletal:  Negative for joint swelling.  Neurological:  Positive for headaches.  All other systems reviewed and are negative.  Blood pressure (!) 174/93, pulse 92, temperature 97.9 F (36.6 C), temperature source Oral, resp. rate 16, height 5\' 10"  (1.778 m), weight 102.1 kg, SpO2 100 %. Physical Exam Constitutional:      General: He is not in acute distress.     Appearance: Normal appearance.  HENT:     Head: Normocephalic.  Comments: Left sided forehead lac repaired, ecchymosis around left eye     Right Ear: External ear normal.     Left Ear: External ear normal.     Nose: Nose normal.     Mouth/Throat:     Mouth: Mucous membranes are dry.  Eyes:     General: No scleral icterus.    Extraocular Movements: Extraocular movements intact.     Pupils: Pupils are equal, round, and reactive to light.  Cardiovascular:     Rate and Rhythm: Normal rate and regular rhythm.     Pulses: Normal pulses.     Heart sounds: Normal heart sounds.  Pulmonary:     Effort: Pulmonary effort is normal.     Breath sounds: Normal breath sounds. No wheezing.  Chest:     Chest wall: Tenderness (left lower rib cage) present.  Abdominal:     General: There is no distension.     Tenderness: There is no abdominal tenderness.     Comments: Hai pump left abdomen   Musculoskeletal:        General: No tenderness.     Cervical back: Normal range of motion and neck supple. No tenderness.     Right lower leg: No edema.     Left lower leg: No edema.  Lymphadenopathy:     Cervical: No cervical adenopathy.  Skin:    General: Skin is warm and dry.     Capillary Refill: Capillary refill takes less than 2 seconds.  Neurological:     General: No focal deficit present.     Mental Status: He is alert and oriented to person, place, and time.  Psychiatric:        Mood and Affect: Mood normal.        Behavior: Behavior normal.     Assessment/Plan Fall Anterior mediastinal hematoma-ekg appears baseline, on monitor is in sinus with normal rate, has reproducible pain at site with hematoma though on plavix.  Will check troponin.  Plan for admission to [facility] with telemetry, can consider echo in am Scalp lac- repaired Hold lovenox, hold plavix, scds   Rolm Bookbinder, MD 05/28/2021, 11:51 PM

## 2021-05-28 NOTE — ED Notes (Signed)
Attempted lab draw, pt. Requested lab drawn from port. RN, Cat made aware.

## 2021-05-28 NOTE — ED Provider Notes (Signed)
Emergency Medicine Provider Triage Evaluation Note  THAER MIYOSHI , a 80 y.o. male  was evaluated in triage.  Pt complains of fall. Tripped and fell forward hitting his head and left chest. C/o left chest wall pain, sob. Is on plavix, no loc.  Review of Systems  Positive: Head injury, chest wall pain, pleuritic pain Negative: No loc  Physical Exam  BP (!) 174/93 (BP Location: Left Arm)    Pulse 92    Temp 97.9 F (36.6 C) (Oral)    Resp 16    Ht 5\' 10"  (1.778 m)    Wt 102.1 kg    SpO2 100%    BMI 32.28 kg/m  Gen:   Awake, no distress   Resp:  Normal effort  MSK:   Moves extremities without difficulty  Other:  Lungs ctab, heart rrr, left chest wall ttp, 3cm laceration to the left forehead, ecchymosis about the left eye  Medical Decision Making  Medically screening exam initiated at 7:07 PM.  Appropriate orders placed.  DARRELL HAUK was informed that the remainder of the evaluation will be completed by another provider, this initial triage assessment does not replace that evaluation, and the importance of remaining in the ED until their evaluation is complete.     Bishop Dublin 05/28/21 1908    Carmin Muskrat, MD 05/28/21 2226

## 2021-05-28 NOTE — ED Provider Notes (Signed)
[facility] DEPT Provider Note   CSN: [id number] Arrival date & time: 05/28/21  6503     History Chief Complaint  Patient presents with   Luis [name] is a 80 y.o. male with a past medical history as noted below who presents to the ED after mechanical fall.  Patient states he missed a step and fell forward striking his head.  No loss of consciousness.  He is currently on Plavix.  Patient endorses left-sided facial pain and left anterior chest wall pain.  Patient states chest wall pain worse with deep inspiration patient.  Patient sustained 2 lacerations to left side of head.  Unsure when his last tetanus shot was.  Patient denies nausea and vomiting.  Denies dizziness.  No changes to speech, changes to vision, or unilateral weakness.  Denies neck and back pain.  States he was able to ambulate following the accident without difficulties.  No treatment prior to arrival.  History obtained from patient and past medical records. No interpreter used during encounter.       Past Medical History:  Diagnosis Date   Adenocarcinoma of cecum ([place]) 06/21/2017   Arthritis    "mid back; hands; knees" (02/24/2015)   Basal cell carcinoma    left shoulder; mid chest; right eyelid (02/24/2015)   CAD (coronary artery disease)    a. 08/2014 Cath/PCI: LM nl, LAD 30p, D1 95 (2.25x12 Resolute Integrity DES), LCX small, RI 100 (attempted PCI) - branches fill via L->L collats, RCA dominant, nl, RPDA/PLA nl, EF 60^. b. 02/24/2015 PCI CTO of Ramus DES x2.   Chronic bronchitis ([place])    hx   Diverticulosis 05/2005   Elevated lipase    Fuchs' corneal dystrophy    History of adenomatous polyp of colon 05/2005   8 mm adenoma   History of blood transfusion    "related to some of my surgeries"   Hyperlipidemia    Hypertension    Iron deficiency anemia due to chronic blood loss 06/21/2017   Pneumonia    Prostate cancer ([place]) 2011   S/P seed implant   Sarcoidosis     Thrombocytopenia ([place])    a. Noted on prior labs, unclear of what w/u done.   Trifascicular block     Patient Active Problem List   Diagnosis Date Noted   Fall 05/28/2021   S/P thoracentesis    Hematuria 07/28/2020   Adenocarcinoma ([place])    Smoker 02/04/2020   Liver metastases ([place]) 08/26/2019   Port-A-Cath in place 03/26/2019   Goals of care, counseling/discussion 02/25/2019   Carotid stenosis 01/07/2019   Cecal cancer ([place]) 07/26/2017   Adenocarcinoma of cecum ([place]) 06/21/2017   Iron deficiency anemia due to chronic blood loss 06/21/2017   DOE (dyspnea on exertion) 03/30/2015   Obesity (BMI 30-39.9) 03/30/2015   Trifascicular block    Nonspecific chest pain 02/24/2015   CAD (coronary artery disease) 08/13/2014   Essential hypertension 08/13/2014   Hyperlipidemia 08/13/2014   Pulmonary fibrosis ([place]) 08/13/2014   Sarcoidosis    Syncope 01/29/2012    Past Surgical History:  Procedure Laterality Date   BASAL CELL CARCINOMA EXCISION Left    shoulder   CARDIAC CATHETERIZATION N/A 02/24/2015   Procedure: Coronary/Bypass Graft CTO Intervention;  Surgeon: Jettie Booze, MD;  Location: [facility];  Service: Cardiovascular;  Laterality: N/A;   CARDIAC CATHETERIZATION  02/24/2015   Procedure: Coronary/Graft Atherectomy;  Surgeon: Jettie Booze, MD;  Location: [facility];  Service: Cardiovascular;;   CATARACT EXTRACTION W/ INTRAOCULAR LENS  IMPLANT, BILATERAL Bilateral ~ 2011-2012   COLONOSCOPY W/ POLYPECTOMY  06/08/2005 and 10/18/2010   8 mm adenoma 2006, none 2012. Diverticulosis and internal hemorrhoids.   CORNEAL TRANSPLANT Bilateral ~ 2011-2012   "@ same time as cataract OR"   CORONARY ANGIOPLASTY WITH STENT PLACEMENT  08/2014; 02/24/2015   "1 stent + 1 stent"   CORONARY STENT INTERVENTION N/A 01/02/2019   Procedure: CORONARY STENT INTERVENTION;  Surgeon: Jettie Booze, MD;  Location: [facility];  Service: Cardiovascular;  Laterality: N/A;    EYE SURGERY     FINGER SURGERY Right 2014   "reattached middle finger"   INSERTION PROSTATE RADIATION SEED  ~ 2011   IR IMAGING GUIDED PORT INSERTION  03/04/2019   IR RADIOLOGIST EVAL & MGMT  05/26/2020   IR US GUIDE BX ASP/DRAIN  03/04/2019   LAPAROSCOPIC CHOLECYSTECTOMY  2015   LAPAROSCOPIC PARTIAL COLECTOMY N/A 07/26/2017   Procedure: LAPAROSCOPIC PARTIAL COLECTOMY, EXCISION SCROTAL CYST;  Surgeon: Greer Pickerel, MD;  Location: [facility];  Service: General;  Laterality: N/A;   LEFT HEART CATH AND CORONARY ANGIOGRAPHY N/A 01/02/2019   Procedure: LEFT HEART CATH AND CORONARY ANGIOGRAPHY;  Surgeon: Jettie Booze, MD;  Location: [facility];  Service: Cardiovascular;  Laterality: N/A;   LEFT HEART CATHETERIZATION WITH CORONARY ANGIOGRAM N/A 08/12/2014   Procedure: LEFT HEART CATHETERIZATION WITH CORONARY ANGIOGRAM;  Surgeon: Blane Ohara, MD;  Location: [facility] CATH LAB;  Service: Cardiovascular;  Laterality: N/A;   LYMPH NODE BIOPSY     mid chest   MOHS SURGERY Right ~ 2011   "eyelid; for basal cell"   RADIOLOGY WITH ANESTHESIA N/A 06/30/2020   Procedure: IR WITH ANESTHESIA MICROWAVE ABLATION;  Surgeon: Corrie Mckusick, DO;  Location: [facility];  Service: Anesthesiology;  Laterality: N/A;   THORACENTESIS N/A 02/11/2021   Procedure: Mathews Robinsons;  Surgeon: Lanier Clam, MD;  Location: [facility] END[facility];  Service: Pulmonary;  Laterality: N/A;   THORACENTESIS Right 04/04/2021   Procedure: THORACENTESIS;  Surgeon: Candee Furbish, MD;  Location: [facility] ENDOSCOPY;  Service: Pulmonary;  Laterality: Right;   THOROCOTOMY WITH LOBECTOMY Right ~ 1991   partial removal right lung   TONSILLECTOMY  ~ 1950       Family History  Problem Relation Age of Onset   Heart disease Father 49       Died from "hardening of the arteries" age 31   Coronary artery disease Sister 27   Heart attack Sister    Colon cancer Neg Hx    Throat cancer Neg Hx    Pancreatic cancer Neg Hx    Prostate cancer Neg Hx     Stroke Neg Hx    Hypertension Neg Hx     Social History   Tobacco Use   Smoking status: Light Smoker    Packs/day: 0.00    Years: 59.00    Pack years: 0.00    Types: Cigars, Cigarettes    Last attempt to quit: 02/24/2015    Years since quitting: 6.2   Smokeless tobacco: Never   Tobacco comments:    02/24/2015 "quit cigarettes ~ 2000 ago but smokes cigars once/month"verified 03/10/21. Once in a while.   Vaping Use   Vaping Use: Never used  Substance Use Topics   Alcohol use: Yes    Alcohol/week: 2.0 standard drinks    Types: 1 Glasses of wine, 1 Standard drinks or equivalent per week  Comment: Drinks maybe 1-2 drinks a week or less   Drug use: No    Home Medications Prior to Admission medications   Medication Sig Start Date End Date Taking? Authorizing Provider  albuterol (VENTOLIN HFA) 108 (90 Base) MCG/ACT inhaler Inhale 1-2 puffs into the lungs every 6 (six) hours as needed for wheezing or shortness of breath. 1/32/44   Campbell Stall P, DO  CANNABIDIOL PO Take 10 mg by mouth daily. CBD OIL    [provider]  Cholecalciferol (VITAMIN D3) 2000 units capsule Take 2,000 Units by mouth every evening.     [provider]  clobetasol (TEMOVATE) 0.05 % external solution Apply 1 application topically daily as needed (rash). 08/30/20 08/30/21  [provider]  clopidogrel (PLAVIX) 75 MG tablet Take 1 tablet (75 mg total) by mouth daily. 02/02/21   Jettie Booze, MD  clotrimazole (LOTRIMIN) 1 % cream Apply 1 application topically daily as needed (Rash). 12/17/20   [provider]  fluticasone furoate-vilanterol (BREO ELLIPTA) 200-25 MCG/INH AEPB Inhale 1 puff into the lungs daily. 04/28/20   Hunsucker, Bonna Gains, MD  furosemide (LASIX) 20 MG tablet Take 20 mg by mouth.    [provider]  gabapentin (NEURONTIN) 100 MG capsule Take 200 mg by mouth daily as needed (neuropathy).    [provider]  magnesium oxide (MAG-OX) 400 MG  tablet Take 400 mg by mouth daily.    [provider]  naproxen sodium (ALEVE) 220 MG tablet Take 440 mg by mouth daily as needed (pain).    [provider]  nitroGLYCERIN (NITROSTAT) 0.4 MG SL tablet PLACE 1 TABLET UNDER THE TONGUE EVERY 5 MINUTES AS NEEDED FOR CHEST PAIN FOR 3 DOSES 01/21/21   Jettie Booze, MD  prochlorperazine (COMPAZINE) 10 MG tablet Take 1 tablet (10 mg total) by mouth every 6 (six) hours as needed. 02/26/19   Ladell Pier, MD  ranolazine (RANEXA) 500 MG 12 hr tablet Take 500 mg by mouth 2 (two) times daily.    [provider]  rosuvastatin (CRESTOR) 20 MG tablet TAKE 1 TABLET BY MOUTH EVERY DAY 05/11/21   Jettie Booze, MD  traMADol (ULTRAM) 50 MG tablet Take 50-100 mg by mouth every 6 (six) hours as needed for moderate pain or severe pain.    [provider]    Allergies    Patient has no known allergies.  Review of Systems   Review of Systems  Eyes:  Negative for visual disturbance.  Respiratory:  Negative for shortness of breath.   Cardiovascular:  Positive for chest pain (chest wall pain).  Gastrointestinal:  Negative for nausea and vomiting.  Musculoskeletal:  Negative for back pain and neck pain.  Skin:  Positive for wound.  Neurological:  Negative for dizziness, weakness and numbness.  All other systems reviewed and are negative.  Physical Exam Updated Vital Signs BP (!) 174/93 (BP Location: Left Arm)    Pulse 92    Temp 97.9 F (36.6 C) (Oral)    Resp 16    Ht 5\' 10"  (1.778 m)    Wt 102.1 kg    SpO2 100%    BMI 32.28 kg/m   Physical Exam Vitals and nursing note reviewed.  Constitutional:      General: He is not in acute distress.    Appearance: He is not ill-appearing.  HENT:     Head: Normocephalic.     Comments: 3cm laceration to left side of forehead. 1cm laceration above  left eyebrow.  No hemotympanum, malocclusion, or septal hematoma.  Eyes:     Extraocular Movements: Extraocular movements  intact.     Pupils: Pupils are equal, round, and reactive to light.     Comments: Left periorbital ecchymosis and edema.  No hyphema.  EOMs intact.  Neck:     Comments: No cervical midline tenderness Cardiovascular:     Rate and Rhythm: Normal rate and regular rhythm.     Pulses: Normal pulses.     Heart sounds: Normal heart sounds. No murmur heard.   No friction rub. No gallop.  Pulmonary:     Effort: Pulmonary effort is normal.     Breath sounds: Normal breath sounds.     Comments: Respirations equal and unlabored, patient able to speak in full sentences, lungs clear to auscultation bilaterally Chest:       Comments: Tenderness throughout anterior left side of chest wall with crepitus.  Abdominal:     General: Abdomen is flat. There is no distension.     Palpations: Abdomen is soft.     Tenderness: There is no abdominal tenderness. There is no guarding or rebound.  Musculoskeletal:        General: Normal range of motion.     Cervical back: Neck supple.     Comments: No thoracic or lumbar midline tenderness  Skin:    General: Skin is warm and dry.  Neurological:     General: No focal deficit present.     Mental Status: He is alert.     Comments: Speech is clear, able to follow commands CN III-XII intact Normal strength in upper and lower extremities bilaterally including dorsiflexion and plantar flexion, strong and equal grip strength Sensation grossly intact throughout Moves extremities without ataxia, coordination intact No pronator drift Ambulates without difficulty  Psychiatric:        Mood and Affect: Mood normal.        Behavior: Behavior normal.    ED Results / Procedures / Treatments   Labs (all labs ordered are listed, but only abnormal results are displayed) Labs Reviewed  I-STAT CHEM 8, ED - Abnormal; Notable for the following components:      Result Value   Glucose, Bld 130 (*)    Hemoglobin 12.2 (*)    HCT 36.0 (*)    All other components within  normal limits  RESP PANEL BY RT-PCR (FLU A&B, COVID) ARPGX2  CBC WITH DIFFERENTIAL/PLATELET  COMPREHENSIVE METABOLIC PANEL  I-STAT BETA HCG BLOOD, ED ([facility], [facility], [facility] ONLY)  TROPONIN I (HIGH SENSITIVITY)    EKG EKG Interpretation  Date/Time:  Saturday May 28 2021 23:11:01 EST Ventricular Rate:  89 PR Interval:  227 QRS Duration: 139 QT Interval:  384 QTC Calculation: 468 R Axis:   224 Text Interpretation: Sinus or ectopic atrial rhythm Prolonged PR interval IVCD, consider atypical RBBB Probable anterolateral infarct, old Abnormal T, consider ischemia, lateral leads Confirmed by Veryl Speak [phone]) on 05/28/2021 11:19:57 PM  Radiology DG Ribs Unilateral W/Chest Left  Result Date: 05/28/2021 CLINICAL DATA:  Fall today.  Complains of anterior left rib pain. EXAM: LEFT RIBS AND CHEST - 3+ VIEW COMPARISON:  Chest radiograph 04/04/2021; CT chest 01/22/2021 FINDINGS: No fracture or other bone lesions are seen involving the ribs. There is no evidence of pneumothorax or pleural effusion. Surgical staple seen in the right mid and lower lung. Chronic small right pleural effusion. The lungs are otherwise clear. Multiple calcified mediastinal lymph nodes, stable. Stable cardiomediastinal silhouette. Aortic calcifications.  Power injectable right IJ central venous catheter tip projects at the level of the mid SVC. IMPRESSION: 1. No rib fracture. 2. Chronic small right pleural effusion. No new focal consolidation. 3.  Aortic Atherosclerosis (ICD10-I70.0). Electronically Signed   By: Ileana Roup M.D.   On: 05/28/2021 19:36   CT Head Wo Contrast  Result Date: 05/28/2021 CLINICAL DATA:  Recent trip and fall with headaches and neck pain, initial encounter EXAM: CT HEAD WITHOUT CONTRAST CT MAXILLOFACIAL WITHOUT CONTRAST CT CERVICAL SPINE WITHOUT CONTRAST TECHNIQUE: Multidetector CT imaging of the head, cervical spine, and maxillofacial structures were performed using the standard protocol without  intravenous contrast. Multiplanar CT image reconstructions of the cervical spine and maxillofacial structures were also generated. COMPARISON:  None. FINDINGS: CT HEAD FINDINGS Brain: No evidence of acute infarction, hemorrhage, hydrocephalus, extra-axial collection or mass lesion/mass effect. Chronic atrophic and ischemic changes are noted. Vascular: No hyperdense vessel or unexpected calcification. Skull: Normal. Negative for fracture or focal lesion. Other: Scalp swelling is noted in the left supraorbital region laterally. CT MAXILLOFACIAL FINDINGS Osseous: No acute bony abnormality is noted. Orbits: Orbits and their contents are within normal limits. Sinuses: Paranasal sinuses are clear. Soft tissues: Left periorbital region laterally and extending superiorly consistent with the recent injury. No other soft tissue abnormality is noted. CT CERVICAL SPINE FINDINGS Alignment: Within normal limits. Skull base and vertebrae: 7 cervical segments are well visualized. 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 Facet hypertrophic changes are noted. Varied levels of fusion are noted from C6-T2. Posterior fusion defect is noted at C4. No acute fracture or acute facet abnormality is noted. Soft tissues and spinal canal: Right chest wall port is noted. Surrounding soft tissue structures demonstrate vascular calcifications. No hematoma or mass lesion is seen. Upper chest: Visualized lung apices demonstrate scarring in the right apex similar to that seen on prior CT from August of 2022. Other: None IMPRESSION: CT of the head: No acute intracranial abnormality noted. CT of the maxillofacial bones: No acute fracture is seen. Soft tissue swelling in the left periorbital region and left scalp is seen. 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 Electronically Signed   By: Inez Catalina  M.D.   On: 05/28/2021 19:51   CT Chest W Contrast  Result Date: 05/28/2021 CLINICAL DATA:  Trauma. Fall. History of metastatic colorectal cancer. EXAM: CT CHEST WITH CONTRAST TECHNIQUE: Multidetector CT imaging of the chest was performed during intravenous contrast administration. CONTRAST:  63mL OMNIPAQUE IOHEXOL 350 MG/ML SOLN COMPARISON:  CT chest 01/22/2021. FINDINGS: Cardiovascular: Heart is mildly enlarged. There is no pericardial effusion. Aorta is normal in size. There are atherosclerotic calcifications of the aorta and coronary arteries. Central venous catheter tip ends at the cavoatrial junction. Mediastinum/Nodes: Calcified nodule in the right thyroid gland measures 6 mm and is unchanged. There are diffuse calcified mediastinal and hilar lymph nodes similar to the prior study. Esophagus is nondilated. There is stranding and heterogeneous hyperdensity in the anterior left mediastinum at the level of the heart concerning for mediastinal hematoma. No pneumomediastinum. Lungs/Pleura: There is a small to moderate-sized right pleural effusion which is likely partially loculated, similar to the prior examination. No pneumothorax. Right upper lobe parenchymal opacities and retraction appear similar to the prior examination. Surgical staple line in the right upper hemithorax is unchanged. Linear areas of scarring in the  left upper lobe are similar to the prior study. Tree-in-bud opacities in the inferior left upper lobe are also similar from the prior examination. There are bilateral calcified nodules, unchanged. Trachea and central airways are patent. Musculoskeletal: No chest wall abnormality. No acute or significant osseous findings. IMPRESSION: 1. Small amount of anterior mediastinal hematoma at the level of the heart. No acute fractures are seen. 2. Stable loculated right pleural effusion. Stable chronic changes in both lungs. No new focal lung infiltrate. Electronically Signed   By: Ronney Asters M.D.    On: 05/28/2021 22:40   CT Cervical Spine Wo Contrast  Result Date: 05/28/2021 CLINICAL DATA:  Recent trip and fall with headaches and neck pain, initial encounter EXAM: CT HEAD WITHOUT CONTRAST CT MAXILLOFACIAL WITHOUT CONTRAST CT CERVICAL SPINE WITHOUT CONTRAST TECHNIQUE: Multidetector CT imaging of the head, cervical spine, and maxillofacial structures were performed using the standard protocol without intravenous contrast. Multiplanar CT image reconstructions of the cervical spine and maxillofacial structures were also generated. COMPARISON:  None. FINDINGS: CT HEAD FINDINGS Brain: No evidence of acute infarction, hemorrhage, hydrocephalus, extra-axial collection or mass lesion/mass effect. Chronic atrophic and ischemic changes are noted. Vascular: No hyperdense vessel or unexpected calcification. Skull: Normal. Negative for fracture or focal lesion. Other: Scalp swelling is noted in the left supraorbital region laterally. CT MAXILLOFACIAL FINDINGS Osseous: No acute bony abnormality is noted. Orbits: Orbits and their contents are within normal limits. Sinuses: Paranasal sinuses are clear. Soft tissues: Left periorbital region laterally and extending superiorly consistent with the recent injury. No other soft tissue abnormality is noted. CT CERVICAL SPINE FINDINGS Alignment: Within normal limits. Skull base and vertebrae: 7 cervical segments are well visualized. 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 Facet hypertrophic changes are noted. Varied levels of fusion are noted from C6-T2. Posterior fusion defect is noted at C4. No acute fracture or acute facet abnormality is noted. Soft tissues and spinal canal: Right chest wall port is noted. Surrounding soft tissue structures demonstrate vascular calcifications. No hematoma or mass lesion is seen. Upper chest: Visualized lung apices demonstrate scarring in the right apex similar  to that seen on prior CT from August of 2022. Other: None IMPRESSION: CT of the head: No acute intracranial abnormality noted. CT of the maxillofacial bones: No acute fracture is seen. Soft tissue swelling in the left periorbital region and left scalp is seen. 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 Electronically Signed   By: Inez Catalina M.D.   On: 05/28/2021 19:51   CT ABDOMEN PELVIS W CONTRAST  Result Date: 05/28/2021 CLINICAL DATA:  Blunt abdominal trauma, left flank pain. Metastatic colorectal cancer. EXAM: CT ABDOMEN AND PELVIS WITH CONTRAST TECHNIQUE: Multidetector CT imaging of the abdomen and pelvis was performed using the standard protocol following bolus administration of intravenous contrast. CONTRAST:  50mL OMNIPAQUE IOHEXOL 350 MG/ML SOLN COMPARISON:  04/26/2020, 06/30/2020, CT chest 01/22/2021 FINDINGS: Lower chest: Moderate right pleural effusion is present, unchanged from prior examination. Right basilar wedge resection x2 has been performed with stable resultant right-sided volume loss. Scarring within the right upper lobe is unchanged. Numerous subcentimeter nodules within the superior segment of the a right lower lobe appear grossly unchanged from prior examination. Extensive coronary artery stenting has been performed. Global cardiac size within normal limits. No pericardial effusion. There is infiltration of the pericardial fat  at the left lung base most in keeping with interstitial hemorrhage without significant mass effect upon the cardiac structures. No mass forming hematoma identified. Hepatobiliary: Metastatic lesion within the right hepatic lobe appears smaller than on prior pre ablation imaging of 06/30/2020, now measuring 2.5 x 3.7 cm at axial image # 57/2. Fiducial markers and post ablated changes are noted involving the inferior right hepatic lobe. Lesion within the subserosal left hepatic lobe appears smaller,  measuring 1.0 x 2.8 cm at axial image # 50/2 with retraction of the hepatic capsule. Cholecystectomy has been performed. Pancreas: Unremarkable Spleen: Unremarkable Adrenals/Urinary Tract: Adrenal glands are unremarkable. Kidneys are normal, without renal calculi, focal lesion, or hydronephrosis. Bladder is unremarkable. Stomach/Bowel: Severe descending and sigmoid colonic diverticulosis without superimposed acute inflammatory change. Right hemicolectomy has been performed. No evidence of obstruction or focal inflammation. Stomach, small bowel, and relate to ol rib residual large bowel are unremarkable. No free intraperitoneal gas or fluid. Implanted intraperitoneal port catheter is seen with the catheter tip within the periportal region. Vascular/Lymphatic: Aortic atherosclerosis. No enlarged abdominal or pelvic lymph nodes. Reproductive: Brachytherapy seeds are seen within the prostate gland. Other: Tiny fat containing bilateral inguinal hernias are present. Musculoskeletal: No acute bone abnormality. IMPRESSION: Infiltration of the pericardial fat at the left lung base most in keeping with interstitial hemorrhage related to acute trauma. No mass forming hematoma identified. No significant mass effect upon the cardiac structures. No definite fracture is clearly identified. No acute intra-abdominal pathology identified. Interval response to therapy with post ablated changes involving the inferior right hepatic lesion and interval decrease in size within the superior right and left hepatic metastatic masses. Distal colonic diverticulosis without acute inflammatory change. Aortic Atherosclerosis (ICD10-I70.0). Electronically Signed   By: Fidela Salisbury M.D.   On: 05/28/2021 22:50   CT Maxillofacial Wo Contrast  Result Date: 05/28/2021 CLINICAL DATA:  Recent trip and fall with headaches and neck pain, initial encounter EXAM: CT HEAD WITHOUT CONTRAST CT MAXILLOFACIAL WITHOUT CONTRAST CT CERVICAL SPINE WITHOUT  CONTRAST TECHNIQUE: Multidetector CT imaging of the head, cervical spine, and maxillofacial structures were performed using the standard protocol without intravenous contrast. Multiplanar CT image reconstructions of the cervical spine and maxillofacial structures were also generated. COMPARISON:  None. FINDINGS: CT HEAD FINDINGS Brain: No evidence of acute infarction, hemorrhage, hydrocephalus, extra-axial collection or mass lesion/mass effect. Chronic atrophic and ischemic changes are noted. Vascular: No hyperdense vessel or unexpected calcification. Skull: Normal. Negative for fracture or focal lesion. Other: Scalp swelling is noted in the left supraorbital region laterally. CT MAXILLOFACIAL FINDINGS Osseous: No acute bony abnormality is noted. Orbits: Orbits and their contents are within normal limits. Sinuses: Paranasal sinuses are clear. Soft tissues: Left periorbital region laterally and extending superiorly consistent with the recent injury. No other soft tissue abnormality is noted. CT CERVICAL SPINE FINDINGS Alignment: Within normal limits. Skull base and vertebrae: 7 cervical segments are well visualized. 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 Facet hypertrophic changes are noted. Varied levels of fusion are noted from C6-T2. Posterior fusion defect is noted at C4. No acute fracture or acute facet abnormality is noted. Soft tissues and spinal canal: Right chest wall port is noted. Surrounding soft tissue structures demonstrate vascular calcifications. No hematoma or mass lesion is seen. Upper chest: Visualized lung apices demonstrate scarring in the right apex similar to that seen on prior CT from August of 2022. Other: None IMPRESSION:  CT of the head: No acute intracranial abnormality noted. CT of the maxillofacial bones: No acute fracture is seen. Soft tissue swelling in the left periorbital region and left scalp is seen.  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 Electronically Signed   By: Inez Catalina M.D.   On: 05/28/2021 19:51    Procedures .Marland KitchenLaceration Repair  Date/Time: 05/28/2021 10:01 PM Performed by: Suzy Bouchard, PA-C Authorized by: Suzy Bouchard, PA-C   Consent:    Consent obtained:  Verbal   Consent given by:  Patient   Risks discussed:  Infection, need for additional repair, pain, poor cosmetic result and poor wound healing   Alternatives discussed:  No treatment and delayed treatment Universal protocol:    Procedure explained and questions answered to patient or proxy's satisfaction: yes     Relevant documents present and verified: yes     Test results available: yes     Imaging studies available: yes     Required blood products, implants, devices, and special equipment available: yes     Site/side marked: yes     Immediately prior to procedure, a time out was called: yes     Patient identity confirmed:  Verbally with patient Anesthesia:    Anesthesia method:  Local infiltration   Local anesthetic:  Lidocaine 1% WITH epi Laceration details:    Location:  Face   Face location:  Forehead   Length (cm):  3   Depth (mm):  3 Pre-procedure details:    Preparation:  Patient was prepped and draped in usual sterile fashion and imaging obtained to evaluate for foreign bodies Exploration:    Limited defect created (wound extended): no     Hemostasis achieved with:  Cautery   Imaging obtained comment:  CT   Imaging outcome: foreign body not noted     Wound exploration: wound explored through full range of motion and entire depth of wound visualized     Wound extent: no foreign bodies/material noted and no underlying fracture noted     Contaminated: no   Treatment:    Area cleansed with:  Saline   Amount of cleaning:  Standard   Irrigation solution:  Sterile saline   Irrigation volume:  50   Irrigation  method:  Syringe   Visualized foreign bodies/material removed: no     Debridement:  None   Undermining:  None   Scar revision: no   Skin repair:    Repair method:  Sutures   Suture size:  5-0   Suture material:  Prolene   Suture technique:  Simple interrupted   Number of sutures:  6 Approximation:    Approximation:  Close Repair type:    Repair type:  Intermediate Post-procedure details:    Dressing:  Open (no dressing)   Procedure completion:  Tolerated well, no immediate complications .Marland KitchenLaceration Repair  Date/Time: 05/28/2021 10:02 PM Performed by: Suzy Bouchard, PA-C Authorized by: Suzy Bouchard, PA-C   Consent:    Consent obtained:  Verbal   Consent given by:  Patient   Risks discussed:  Infection, need for additional repair, pain, poor cosmetic result and poor wound healing   Alternatives discussed:  No treatment and delayed treatment Universal protocol:    Procedure explained and questions answered to patient or proxy's satisfaction: yes     Relevant documents present and verified: yes     Test results available: yes  Imaging studies available: yes     Required blood products, implants, devices, and special equipment available: yes     Site/side marked: yes     Immediately prior to procedure, a time out was called: yes     Patient identity confirmed:  Verbally with patient Anesthesia:    Anesthesia method:  Local infiltration   Local anesthetic:  Lidocaine 1% WITH epi Laceration details:    Location:  Face   Face location:  L eyebrow   Length (cm):  1   Depth (mm):  1 Pre-procedure details:    Preparation:  Patient was prepped and draped in usual sterile fashion and imaging obtained to evaluate for foreign bodies Exploration:    Limited defect created (wound extended): no     Hemostasis achieved with:  Cautery   Imaging obtained comment:  CT   Imaging outcome: foreign body not noted     Wound exploration: wound explored through full range of  motion and entire depth of wound visualized     Wound extent: no underlying fracture noted     Contaminated: no   Treatment:    Area cleansed with:  Saline   Amount of cleaning:  Standard   Irrigation solution:  Sterile saline   Irrigation volume:  50   Irrigation method:  Syringe   Visualized foreign bodies/material removed: no     Debridement:  None   Undermining:  None   Scar revision: no   Skin repair:    Repair method:  Sutures   Suture size:  6-0   Suture material:  Prolene   Suture technique:  Simple interrupted   Number of sutures:  3 Approximation:    Approximation:  Close Repair type:    Repair type:  Simple Post-procedure details:    Dressing:  Open (no dressing)   Procedure completion:  Tolerated well, no immediate complications .Critical Care Performed by: Suzy Bouchard, PA-C Authorized by: Suzy Bouchard, PA-C   Critical care provider statement:    Critical care time (minutes):  30   Critical care was necessary to treat or prevent imminent or life-threatening deterioration of the following conditions:  Trauma   Critical care was time spent personally by me on the following activities:  Development of treatment plan with patient or surrogate, discussions with consultants, evaluation of patient's response to treatment, examination of patient, ordering and review of laboratory studies, ordering and review of radiographic studies, ordering and performing treatments and interventions, pulse oximetry, re-evaluation of patient's condition and review of old charts   I assumed direction of critical care for this patient from another provider in my specialty: no     Care discussed with: admitting provider     Medications Ordered in ED Medications  Tdap (BOOSTRIX) injection 0.5 mL (0.5 mLs Intramuscular Given 05/28/21 2133)  lidocaine-EPINEPHrine (XYLOCAINE W/EPI) 2 %-1:100000 (with pres) injection 30 mL (30 mLs Intradermal Given by Other 05/28/21 2133)   traMADol (ULTRAM) tablet 50 mg (50 mg Oral Given 05/28/21 2133)  iohexol (OMNIPAQUE) 350 MG/ML injection 80 mL (80 mLs Intravenous Contrast Given 05/28/21 2223)    ED Course  I have reviewed the triage vital signs and the nursing notes.  Pertinent labs & imaging results that were available during my care of the patient were reviewed by me and considered in my medical decision making (see chart for details).  Clinical Course as of 05/28/21 2352  Sat May 28, 2021  2304 Discussed with Dr. Donne Hazel with surgery who recommends  EKG, cardiac monitoring, and discuss results with radiology [CA]    Clinical Course User Index [CA] Karie Kirks   MDM Rules/Calculators/A&P                         80 year old male presents to the ED after mechanical fall.  Patient fell forward striking the left side of his head.  No loss of consciousness.  He is currently on Plavix.  Upon arrival, stable vitals.  Patient in no acute distress.  Normal neurological exam.  No thoracic or lumbar midline tenderness.  No cervical midline tenderness.  3 cm laceration to left side of forehead.  1 cm laceration above left eyebrow.  Tenderness and crepitus throughout left anterior chest wall. CT images and CXR ordered at triage. Added CT chest and CT abdomen given severe tenderness and crepitus to left chest wall to rule out rib fracture and possible PTX. Tramadol given for pain. Tetanus shot updated.   CT head personally reviewed which is negative for any acute abnormalities.  CT maxillofacial negative.  CT cervical spine negative.  Chest x-ray with left ribs negative for any bony fractures or visible pneumothorax. CT chest demonstrates mediastinal hemorrhage. Discussed with radiology, Dr. Garey Ham who notes reading from CT abdomen and CT chest are the same just worded differently? She also notes no pericardial effusion or visible rib fractures.  EKG, troponin, and routine labs ordered. Patient placed on cardiac  monitoring. Patient will require admission for observation. Trauma service, Dr. Donne Hazel, agrees to admit and patient will be transferred to [facility].   Discussed with Dr. Regenia Skeeter who evaluated patient and agrees with assessment and plan.  Final Clinical Impression(s) / ED Diagnoses Final diagnoses:  Injury of head, initial encounter  Fall, initial encounter    Rx / DC Orders ED Discharge Orders     None        Karie Kirks 05/28/21 2353    Sherwood Gambler, MD 05/31/21 1501

## 2021-05-29 ENCOUNTER — Observation Stay (HOSPITAL_BASED_OUTPATIENT_CLINIC_OR_DEPARTMENT_OTHER): Payer: Medicare PPO

## 2021-05-29 DIAGNOSIS — Z85828 Personal history of other malignant neoplasm of skin: Secondary | ICD-10-CM | POA: Diagnosis not present

## 2021-05-29 DIAGNOSIS — S0181XA Laceration without foreign body of other part of head, initial encounter: Secondary | ICD-10-CM | POA: Diagnosis not present

## 2021-05-29 DIAGNOSIS — Z20822 Contact with and (suspected) exposure to covid-19: Secondary | ICD-10-CM | POA: Diagnosis not present

## 2021-05-29 DIAGNOSIS — Z8546 Personal history of malignant neoplasm of prostate: Secondary | ICD-10-CM | POA: Diagnosis not present

## 2021-05-29 DIAGNOSIS — I2510 Atherosclerotic heart disease of native coronary artery without angina pectoris: Secondary | ICD-10-CM | POA: Diagnosis not present

## 2021-05-29 DIAGNOSIS — Z23 Encounter for immunization: Secondary | ICD-10-CM | POA: Diagnosis not present

## 2021-05-29 DIAGNOSIS — Z955 Presence of coronary angioplasty implant and graft: Secondary | ICD-10-CM | POA: Diagnosis not present

## 2021-05-29 DIAGNOSIS — S29001A Unspecified injury of muscle and tendon of front wall of thorax, initial encounter: Secondary | ICD-10-CM | POA: Diagnosis not present

## 2021-05-29 DIAGNOSIS — I10 Essential (primary) hypertension: Secondary | ICD-10-CM | POA: Diagnosis not present

## 2021-05-29 DIAGNOSIS — S01112A Laceration without foreign body of left eyelid and periocular area, initial encounter: Secondary | ICD-10-CM | POA: Diagnosis not present

## 2021-05-29 DIAGNOSIS — S0990XA Unspecified injury of head, initial encounter: Secondary | ICD-10-CM | POA: Diagnosis present

## 2021-05-29 DIAGNOSIS — R079 Chest pain, unspecified: Secondary | ICD-10-CM

## 2021-05-29 DIAGNOSIS — Z79899 Other long term (current) drug therapy: Secondary | ICD-10-CM | POA: Diagnosis not present

## 2021-05-29 DIAGNOSIS — Z7902 Long term (current) use of antithrombotics/antiplatelets: Secondary | ICD-10-CM | POA: Diagnosis not present

## 2021-05-29 DIAGNOSIS — W19XXXA Unspecified fall, initial encounter: Secondary | ICD-10-CM | POA: Diagnosis not present

## 2021-05-29 DIAGNOSIS — F17210 Nicotine dependence, cigarettes, uncomplicated: Secondary | ICD-10-CM | POA: Diagnosis not present

## 2021-05-29 DIAGNOSIS — W109XXA Fall (on) (from) unspecified stairs and steps, initial encounter: Secondary | ICD-10-CM | POA: Diagnosis not present

## 2021-05-29 DIAGNOSIS — S20219A Contusion of unspecified front wall of thorax, initial encounter: Secondary | ICD-10-CM | POA: Diagnosis not present

## 2021-05-29 LAB — BASIC METABOLIC PANEL
Anion gap: 6 (ref 5–15)
BUN: 15 mg/dL (ref 8–23)
CO2: 27 mmol/L (ref 22–32)
Calcium: 8.5 mg/dL — ABNORMAL LOW (ref 8.9–10.3)
Chloride: 103 mmol/L (ref 98–111)
Creatinine, Ser: 0.78 mg/dL (ref 0.61–1.24)
GFR, Estimated: >60 mL/min (ref >60)
Glucose, Bld: 112 mg/dL — ABNORMAL HIGH (ref 70–99)
Potassium: 3.7 mmol/L (ref 3.5–5.1)
Sodium: 136 mmol/L (ref 135–145)

## 2021-05-29 LAB — COMPREHENSIVE METABOLIC PANEL
ALT: 33 U/L (ref 0–44)
AST: 50 U/L — ABNORMAL HIGH (ref 15–41)
Albumin: 4 g/dL (ref 3.5–5.0)
Alkaline Phosphatase: 114 U/L (ref 38–126)
Anion gap: 8 (ref 5–15)
BUN: 16 mg/dL (ref 8–23)
CO2: 26 mmol/L (ref 22–32)
Calcium: 8.8 mg/dL — ABNORMAL LOW (ref 8.9–10.3)
Chloride: 102 mmol/L (ref 98–111)
Creatinine, Ser: 0.81 mg/dL (ref 0.61–1.24)
GFR, Estimated: >60 mL/min (ref >60)
Glucose, Bld: 123 mg/dL — ABNORMAL HIGH (ref 70–99)
Potassium: 3.9 mmol/L (ref 3.5–5.1)
Sodium: 136 mmol/L (ref 135–145)
Total Bilirubin: 0.7 mg/dL (ref 0.3–1.2)
Total Protein: 6.6 g/dL (ref 6.5–8.1)

## 2021-05-29 LAB — CBC WITH DIFFERENTIAL/PLATELET
Abs Immature Granulocytes: 0.02 10*3/uL (ref 0.00–0.07)
Basophils Absolute: 0 10*3/uL (ref 0.0–0.1)
Basophils Relative: 0 %
Eosinophils Absolute: 0.1 10*3/uL (ref 0.0–0.5)
Eosinophils Relative: 2 %
HCT: 35.1 % — ABNORMAL LOW (ref 39.0–52.0)
Hemoglobin: 10.8 g/dL — ABNORMAL LOW (ref 13.0–17.0)
Immature Granulocytes: 0 %
Lymphocytes Relative: 6 %
Lymphs Abs: 0.4 10*3/uL — ABNORMAL LOW (ref 0.7–4.0)
MCH: 25.1 pg — ABNORMAL LOW (ref 26.0–34.0)
MCHC: 30.8 g/dL (ref 30.0–36.0)
MCV: 81.4 fL (ref 80.0–100.0)
Monocytes Absolute: 1 10*3/uL (ref 0.1–1.0)
Monocytes Relative: 15 %
Neutro Abs: 4.9 10*3/uL (ref 1.7–7.7)
Neutrophils Relative %: 77 %
Platelets: 170 10*3/uL (ref 150–400)
RBC: 4.31 MIL/uL (ref 4.22–5.81)
RDW: 19.8 % — ABNORMAL HIGH (ref 11.5–15.5)
WBC: 6.4 10*3/uL (ref 4.0–10.5)
nRBC: 0 % (ref 0.0–0.2)

## 2021-05-29 LAB — RESP PANEL BY RT-PCR (FLU A&B, COVID) ARPGX2
Influenza A by PCR: NEGATIVE
Influenza B by PCR: NEGATIVE
SARS Coronavirus 2 by RT PCR: NEGATIVE

## 2021-05-29 LAB — CBC
HCT: 33.1 % — ABNORMAL LOW (ref 39.0–52.0)
Hemoglobin: 10.2 g/dL — ABNORMAL LOW (ref 13.0–17.0)
MCH: 25.2 pg — ABNORMAL LOW (ref 26.0–34.0)
MCHC: 30.8 g/dL (ref 30.0–36.0)
MCV: 81.7 fL (ref 80.0–100.0)
Platelets: 148 10*3/uL — ABNORMAL LOW (ref 150–400)
RBC: 4.05 MIL/uL — ABNORMAL LOW (ref 4.22–5.81)
RDW: 19.9 % — ABNORMAL HIGH (ref 11.5–15.5)
WBC: 4.7 10*3/uL (ref 4.0–10.5)
nRBC: 0 % (ref 0.0–0.2)

## 2021-05-29 LAB — ECHOCARDIOGRAM COMPLETE
Area-P 1/2: 2.42 cm2
Height: 70 in
S' Lateral: 2.7 cm
Weight: 3600 oz

## 2021-05-29 LAB — TROPONIN I (HIGH SENSITIVITY): Troponin I (High Sensitivity): 11 ng/L (ref <18)

## 2021-05-29 MED ORDER — OXYCODONE HCL 5 MG PO TABS: 5.0000 mg | ORAL_TABLET | ORAL | Status: DC | PRN

## 2021-05-29 MED ORDER — ACETAMINOPHEN 325 MG PO TABS
650.0000 mg | ORAL_TABLET | Freq: Four times a day (QID) | ORAL | Status: DC
  Administered 2021-05-29 (×3): 650 mg via ORAL
  Filled 2021-05-29 (×3): qty 2

## 2021-05-29 MED ORDER — ALBUTEROL SULFATE (2.5 MG/3ML) 0.083% IN NEBU: 2.5000 mg | INHALATION_SOLUTION | Freq: Four times a day (QID) | RESPIRATORY_TRACT | Status: DC | PRN

## 2021-05-29 MED ORDER — TRAMADOL HCL 50 MG PO TABS: 50.0000 mg | ORAL_TABLET | Freq: Four times a day (QID) | ORAL | 0 refills | Status: DC | PRN

## 2021-05-29 MED ORDER — TRAMADOL HCL 50 MG PO TABS: 100.0000 mg | ORAL_TABLET | Freq: Four times a day (QID) | ORAL | 0 refills | Status: DC | PRN

## 2021-05-29 MED ORDER — HEPARIN SOD (PORK) LOCK FLUSH 100 UNIT/ML IV SOLN
500.0000 [IU] | INTRAVENOUS | Status: AC | PRN
  Administered 2021-05-29: 20:00:00 500 [IU]
  Filled 2021-05-29: qty 5

## 2021-05-29 MED ORDER — ONDANSETRON 4 MG PO TBDP: 4.0000 mg | ORAL_TABLET | Freq: Four times a day (QID) | ORAL | Status: DC | PRN

## 2021-05-29 MED ORDER — MORPHINE SULFATE (PF) 2 MG/ML IV SOLN
1.0000 mg | INTRAVENOUS | Status: DC | PRN
  Administered 2021-05-29: 01:00:00 1 mg via INTRAVENOUS
  Filled 2021-05-29: qty 1

## 2021-05-29 MED ORDER — RANOLAZINE ER 500 MG PO TB12
500.0000 mg | ORAL_TABLET | Freq: Two times a day (BID) | ORAL | Status: DC
  Administered 2021-05-29: 12:00:00 500 mg via ORAL
  Filled 2021-05-29 (×2): qty 1

## 2021-05-29 MED ORDER — ONDANSETRON HCL 4 MG/2ML IJ SOLN: 4.0000 mg | Freq: Four times a day (QID) | INTRAMUSCULAR | Status: DC | PRN

## 2021-05-29 MED ORDER — ALBUTEROL SULFATE HFA 108 (90 BASE) MCG/ACT IN AERS: 1.0000 | INHALATION_SPRAY | Freq: Four times a day (QID) | RESPIRATORY_TRACT | Status: DC | PRN

## 2021-05-29 MED ORDER — SODIUM CHLORIDE 0.9 % IV SOLN: INTRAVENOUS | Status: DC

## 2021-05-29 MED ORDER — FLUTICASONE FUROATE-VILANTEROL 200-25 MCG/ACT IN AEPB: 1.0000 | INHALATION_SPRAY | Freq: Every day | RESPIRATORY_TRACT | Status: DC

## 2021-05-29 MED ORDER — OXYCODONE HCL 5 MG PO TABS
10.0000 mg | ORAL_TABLET | ORAL | Status: DC | PRN
  Administered 2021-05-29: 08:00:00 10 mg via ORAL
  Filled 2021-05-29: qty 2

## 2021-05-29 NOTE — ED Notes (Signed)
Lunch provided.

## 2021-05-29 NOTE — Progress Notes (Signed)
Echocardiogram 2D Echocardiogram has been performed.  Oneal Deputy Imran Nuon RDCS 05/29/2021, 10:17 AM

## 2021-05-29 NOTE — Progress Notes (Signed)
Subjective/Chief Complaint: Feels better, trops normal, monitor normal no arrythmia, pain better, no sbo   Objective: Vital signs in last 24 hours: Temp:  [97.9 F (36.6 C)] 97.9 F (36.6 C) (12/17 1901) Pulse Rate:  [70-92] 70 (12/18 0700) Resp:  [16-20] 16 (12/18 0700) BP: (140-174)/(74-93) 155/84 (12/18 0700) SpO2:  [90 %-100 %] 93 % (12/18 0700) Weight:  [102.1 kg] 102.1 kg (12/17 1901)    Intake/Output from previous day: No intake/output data recorded. Intake/Output this shift: No intake/output data recorded.  General appearance: no distress Head: Normocephalic, without obvious abnormality, left forehead laceration Resp: clear to auscultation bilaterally Chest wall: left sided chest wall tenderness Cardio: regular rate and rhythm GI: soft nt/nd  Lab Results:  Recent Labs    05/29/21 0054 05/29/21 0359  WBC 6.4 4.7  HGB 10.8* 10.2*  HCT 35.1* 33.1*  PLT 170 148*   BMET Recent Labs    05/29/21 0054 05/29/21 0359  NA 136 136  K 3.9 3.7  CL 102 103  CO2 26 27  GLUCOSE 123* 112*  BUN 16 15  CREATININE 0.81 0.78  CALCIUM 8.8* 8.5*   PT/INR No results for input(s): LABPROT, INR in the last 72 hours. ABG No results for input(s): PHART, HCO3 in the last 72 hours.  Invalid input(s): PCO2, PO2  Studies/Results: DG Ribs Unilateral W/Chest Left  Result Date: 05/28/2021 CLINICAL DATA:  Fall today.  Complains of anterior left rib pain. EXAM: LEFT RIBS AND CHEST - 3+ VIEW COMPARISON:  Chest radiograph 04/04/2021; CT chest 01/22/2021 FINDINGS: No fracture or other bone lesions are seen involving the ribs. There is no evidence of pneumothorax or pleural effusion. Surgical staple seen in the right mid and lower lung. Chronic small right pleural effusion. The lungs are otherwise clear. Multiple calcified mediastinal lymph nodes, stable. Stable cardiomediastinal silhouette. Aortic calcifications. Power injectable right IJ central venous catheter tip projects at  the level of the mid SVC. IMPRESSION: 1. No rib fracture. 2. Chronic small right pleural effusion. No new focal consolidation. 3.  Aortic Atherosclerosis (ICD10-I70.0). Electronically Signed   By: Ileana Roup M.D.   On: 05/28/2021 19:36   CT Head Wo Contrast  Result Date: 05/28/2021 CLINICAL DATA:  Recent trip and fall with headaches and neck pain, initial encounter EXAM: CT HEAD WITHOUT CONTRAST CT MAXILLOFACIAL WITHOUT CONTRAST CT CERVICAL SPINE WITHOUT CONTRAST TECHNIQUE: Multidetector CT imaging of the head, cervical spine, and maxillofacial structures were performed using the standard protocol without intravenous contrast. Multiplanar CT image reconstructions of the cervical spine and maxillofacial structures were also generated. COMPARISON:  None. FINDINGS: CT HEAD FINDINGS Brain: No evidence of acute infarction, hemorrhage, hydrocephalus, extra-axial collection or mass lesion/mass effect. Chronic atrophic and ischemic changes are noted. Vascular: No hyperdense vessel or unexpected calcification. Skull: Normal. Negative for fracture or focal lesion. Other: Scalp swelling is noted in the left supraorbital region laterally. CT MAXILLOFACIAL FINDINGS Osseous: No acute bony abnormality is noted. Orbits: Orbits and their contents are within normal limits. Sinuses: Paranasal sinuses are clear. Soft tissues: Left periorbital region laterally and extending superiorly consistent with the recent injury. No other soft tissue abnormality is noted. CT CERVICAL SPINE FINDINGS Alignment: Within normal limits. Skull base and vertebrae: 7 cervical segments are well visualized. 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 Facet hypertrophic changes are noted. Varied levels of fusion are noted from C6-T2. Posterior fusion defect is noted at  C4. No acute fracture or acute facet abnormality is noted. Soft tissues and spinal canal: Right chest wall  port is noted. Surrounding soft tissue structures demonstrate vascular calcifications. No hematoma or mass lesion is seen. Upper chest: Visualized lung apices demonstrate scarring in the right apex similar to that seen on prior CT from August of 2022. Other: None IMPRESSION: CT of the head: No acute intracranial abnormality noted. CT of the maxillofacial bones: No acute fracture is seen. Soft tissue swelling in the left periorbital region and left scalp is seen. 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 Electronically Signed   By: Inez Catalina M.D.   On: 05/28/2021 19:51   CT Chest W Contrast  Result Date: 05/28/2021 CLINICAL DATA:  Trauma. Fall. History of metastatic colorectal cancer. EXAM: CT CHEST WITH CONTRAST TECHNIQUE: Multidetector CT imaging of the chest was performed during intravenous contrast administration. CONTRAST:  65mL OMNIPAQUE IOHEXOL 350 MG/ML SOLN COMPARISON:  CT chest 01/22/2021. FINDINGS: Cardiovascular: Heart is mildly enlarged. There is no pericardial effusion. Aorta is normal in size. There are atherosclerotic calcifications of the aorta and coronary arteries. Central venous catheter tip ends at the cavoatrial junction. Mediastinum/Nodes: Calcified nodule in the right thyroid gland measures 6 mm and is unchanged. There are diffuse calcified mediastinal and hilar lymph nodes similar to the prior study. Esophagus is nondilated. There is stranding and heterogeneous hyperdensity in the anterior left mediastinum at the level of the heart concerning for mediastinal hematoma. No pneumomediastinum. Lungs/Pleura: There is a small to moderate-sized right pleural effusion which is likely partially loculated, similar to the prior examination. No pneumothorax. Right upper lobe parenchymal opacities and retraction appear similar to the prior examination. Surgical staple line in the right upper hemithorax is unchanged. Linear areas of  scarring in the left upper lobe are similar to the prior study. Tree-in-bud opacities in the inferior left upper lobe are also similar from the prior examination. There are bilateral calcified nodules, unchanged. Trachea and central airways are patent. Musculoskeletal: No chest wall abnormality. No acute or significant osseous findings. IMPRESSION: 1. Small amount of anterior mediastinal hematoma at the level of the heart. No acute fractures are seen. 2. Stable loculated right pleural effusion. Stable chronic changes in both lungs. No new focal lung infiltrate. Electronically Signed   By: Ronney Asters M.D.   On: 05/28/2021 22:40   CT Cervical Spine Wo Contrast  Result Date: 05/28/2021 CLINICAL DATA:  Recent trip and fall with headaches and neck pain, initial encounter EXAM: CT HEAD WITHOUT CONTRAST CT MAXILLOFACIAL WITHOUT CONTRAST CT CERVICAL SPINE WITHOUT CONTRAST TECHNIQUE: Multidetector CT imaging of the head, cervical spine, and maxillofacial structures were performed using the standard protocol without intravenous contrast. Multiplanar CT image reconstructions of the cervical spine and maxillofacial structures were also generated. COMPARISON:  None. FINDINGS: CT HEAD FINDINGS Brain: No evidence of acute infarction, hemorrhage, hydrocephalus, extra-axial collection or mass lesion/mass effect. Chronic atrophic and ischemic changes are noted. Vascular: No hyperdense vessel or unexpected calcification. Skull: Normal. Negative for fracture or focal lesion. Other: Scalp swelling is noted in the left supraorbital region laterally. CT MAXILLOFACIAL FINDINGS Osseous: No acute bony abnormality is noted. Orbits: Orbits and their contents are within normal limits. Sinuses: Paranasal sinuses are clear. Soft tissues: Left periorbital region laterally and extending superiorly consistent with the recent injury. No other soft tissue abnormality is noted. CT CERVICAL SPINE FINDINGS Alignment: Within normal limits. Skull  base and  vertebrae: 7 cervical segments are well visualized. 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 Facet hypertrophic changes are noted. Varied levels of fusion are noted from C6-T2. Posterior fusion defect is noted at C4. No acute fracture or acute facet abnormality is noted. Soft tissues and spinal canal: Right chest wall port is noted. Surrounding soft tissue structures demonstrate vascular calcifications. No hematoma or mass lesion is seen. Upper chest: Visualized lung apices demonstrate scarring in the right apex similar to that seen on prior CT from August of 2022. Other: None IMPRESSION: CT of the head: No acute intracranial abnormality noted. CT of the maxillofacial bones: No acute fracture is seen. Soft tissue swelling in the left periorbital region and left scalp is seen. 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 Electronically Signed   By: Inez Catalina M.D.   On: 05/28/2021 19:51   CT ABDOMEN PELVIS W CONTRAST  Result Date: 05/28/2021 CLINICAL DATA:  Blunt abdominal trauma, left flank pain. Metastatic colorectal cancer. EXAM: CT ABDOMEN AND PELVIS WITH CONTRAST TECHNIQUE: Multidetector CT imaging of the abdomen and pelvis was performed using the standard protocol following bolus administration of intravenous contrast. CONTRAST:  106mL OMNIPAQUE IOHEXOL 350 MG/ML SOLN COMPARISON:  04/26/2020, 06/30/2020, CT chest 01/22/2021 FINDINGS: Lower chest: Moderate right pleural effusion is present, unchanged from prior examination. Right basilar wedge resection x2 has been performed with stable resultant right-sided volume loss. Scarring within the right upper lobe is unchanged. Numerous subcentimeter nodules within the superior segment of the a right lower lobe appear grossly unchanged from prior examination. Extensive coronary artery stenting has been  performed. Global cardiac size within normal limits. No pericardial effusion. There is infiltration of the pericardial fat at the left lung base most in keeping with interstitial hemorrhage without significant mass effect upon the cardiac structures. No mass forming hematoma identified. Hepatobiliary: Metastatic lesion within the right hepatic lobe appears smaller than on prior pre ablation imaging of 06/30/2020, now measuring 2.5 x 3.7 cm at axial image # 57/2. Fiducial markers and post ablated changes are noted involving the inferior right hepatic lobe. Lesion within the subserosal left hepatic lobe appears smaller, measuring 1.0 x 2.8 cm at axial image # 50/2 with retraction of the hepatic capsule. Cholecystectomy has been performed. Pancreas: Unremarkable Spleen: Unremarkable Adrenals/Urinary Tract: Adrenal glands are unremarkable. Kidneys are normal, without renal calculi, focal lesion, or hydronephrosis. Bladder is unremarkable. Stomach/Bowel: Severe descending and sigmoid colonic diverticulosis without superimposed acute inflammatory change. Right hemicolectomy has been performed. No evidence of obstruction or focal inflammation. Stomach, small bowel, and relate to ol rib residual large bowel are unremarkable. No free intraperitoneal gas or fluid. Implanted intraperitoneal port catheter is seen with the catheter tip within the periportal region. Vascular/Lymphatic: Aortic atherosclerosis. No enlarged abdominal or pelvic lymph nodes. Reproductive: Brachytherapy seeds are seen within the prostate gland. Other: Tiny fat containing bilateral inguinal hernias are present. Musculoskeletal: No acute bone abnormality. IMPRESSION: Infiltration of the pericardial fat at the left lung base most in keeping with interstitial hemorrhage related to acute trauma. No mass forming hematoma identified. No significant mass effect upon the cardiac structures. No definite fracture is clearly identified. No acute intra-abdominal  pathology identified. Interval response to therapy with post ablated changes involving the inferior right hepatic lesion and interval decrease in size within the superior right and left hepatic metastatic  masses. Distal colonic diverticulosis without acute inflammatory change. Aortic Atherosclerosis (ICD10-I70.0). Electronically Signed   By: Fidela Salisbury M.D.   On: 05/28/2021 22:50   CT Maxillofacial Wo Contrast  Result Date: 05/28/2021 CLINICAL DATA:  Recent trip and fall with headaches and neck pain, initial encounter EXAM: CT HEAD WITHOUT CONTRAST CT MAXILLOFACIAL WITHOUT CONTRAST CT CERVICAL SPINE WITHOUT CONTRAST TECHNIQUE: Multidetector CT imaging of the head, cervical spine, and maxillofacial structures were performed using the standard protocol without intravenous contrast. Multiplanar CT image reconstructions of the cervical spine and maxillofacial structures were also generated. COMPARISON:  None. FINDINGS: CT HEAD FINDINGS Brain: No evidence of acute infarction, hemorrhage, hydrocephalus, extra-axial collection or mass lesion/mass effect. Chronic atrophic and ischemic changes are noted. Vascular: No hyperdense vessel or unexpected calcification. Skull: Normal. Negative for fracture or focal lesion. Other: Scalp swelling is noted in the left supraorbital region laterally. CT MAXILLOFACIAL FINDINGS Osseous: No acute bony abnormality is noted. Orbits: Orbits and their contents are within normal limits. Sinuses: Paranasal sinuses are clear. Soft tissues: Left periorbital region laterally and extending superiorly consistent with the recent injury. No other soft tissue abnormality is noted. CT CERVICAL SPINE FINDINGS Alignment: Within normal limits. Skull base and vertebrae: 7 cervical segments are well visualized. 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 Facet hypertrophic changes are noted. Varied levels of fusion are  noted from C6-T2. Posterior fusion defect is noted at C4. No acute fracture or acute facet abnormality is noted. Soft tissues and spinal canal: Right chest wall port is noted. Surrounding soft tissue structures demonstrate vascular calcifications. No hematoma or mass lesion is seen. Upper chest: Visualized lung apices demonstrate scarring in the right apex similar to that seen on prior CT from August of 2022. Other: None IMPRESSION: CT of the head: No acute intracranial abnormality noted. CT of the maxillofacial bones: No acute fracture is seen. Soft tissue swelling in the left periorbital region and left scalp is seen. 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 Electronically Signed   By: Inez Catalina M.D.   On: 05/28/2021 19:51    Anti-infectives: Anti-infectives (From admission, onward)    None       Assessment/Plan: Fall Anterior mediastinal hematoma-ekg appears baseline, on monitor is in sinus with normal rate, has reproducible pain at site with hematoma though on plavix.  troponin normal.  Will check echo this am- has hematoma without fracture on plavix, if this is fine he can be discharged home. I told him to hold plavix one week and can follow up pcp Scalp lac- repaired hold plavix, scds    Rolm Bookbinder 05/29/2021

## 2021-05-29 NOTE — Progress Notes (Signed)
Previous shift nurse spoke with trauma Dr. Who looked at pt's scans and said he was ok to be discharged. Pt is being discharged in stable condition to home, transported by his wife. AVS given and reviewed.

## 2021-05-29 NOTE — Discharge Summary (Signed)
Physician Discharge Summary  Patient ID: Luis [name] MRN: [id number] DOB/AGE: May 10, 1941 80 y.o.  Admit date: 05/28/2021 Discharge date: 05/29/2021  Admission Diagnoses: Status post fall, recent hematoma  Discharge Diagnoses:  Principal Problem:   Fall   Discharged Condition: good  Hospital Course: Patient was admitted after presentation from [facility].  Patient had anterior mediastinal hematoma as well as scalp lack.  Patient also with left lower chest pain however no fractures.  Patient underwent echocardiogram which was negative.  Patient otherwise was doing well from a pain standpoint.  Patient was deemed stable for discharge and discharged home.  Consults: cardiology  Significant Diagnostic Studies: Echo-please see results in chart  Treatments: Pain control  Discharge Exam: Blood pressure (!) 154/81, pulse 82, temperature 98.5 F (36.9 C), temperature source Oral, resp. rate 18, height 5\' 10"  (1.778 m), weight 102.1 kg, SpO2 95 %. General appearance: alert and cooperative GI: soft, non-tender; bowel sounds normal; no masses,  no organomegaly  Disposition: Discharge disposition: 01-Home or Self Care       Discharge Instructions     Diet - low sodium heart healthy   Complete by: As directed    Increase activity slowly   Complete by: As directed       Allergies as of 05/29/2021   No Known Allergies      Medication List     TAKE these medications    acetaminophen 325 MG tablet Commonly known as: TYLENOL Take 650 mg by mouth every 6 (six) hours as needed for moderate pain.   albuterol 108 (90 Base) MCG/ACT inhaler Commonly known as: VENTOLIN HFA Inhale 1-2 puffs into the lungs every 6 (six) hours as needed for wheezing or shortness of breath.   Breo Ellipta 200-25 MCG/ACT Aepb Generic drug: fluticasone furoate-vilanterol Inhale 1 puff into the lungs daily.   clobetasol 0.05 % external solution Commonly known as: TEMOVATE Apply 1  application topically daily as needed (rash).   clopidogrel 75 MG tablet Commonly known as: PLAVIX Take 75 mg by mouth daily. What changed: Another medication with the same name was removed. Continue taking this medication, and follow the directions you see here.   furosemide 20 MG tablet Commonly known as: LASIX Take 20 mg by mouth.   gabapentin 100 MG capsule Commonly known as: NEURONTIN Take 200 mg by mouth daily as needed (neuropathy).   ibuprofen 100 MG tablet Commonly known as: ADVIL Take 100 mg by mouth every 6 (six) hours as needed for pain.   naproxen sodium 220 MG tablet Commonly known as: ALEVE Take 440 mg by mouth daily as needed (pain).   nitroGLYCERIN 0.4 MG SL tablet Commonly known as: NITROSTAT PLACE 1 TABLET UNDER THE TONGUE EVERY 5 MINUTES AS NEEDED FOR CHEST PAIN FOR 3 DOSES What changed: See the new instructions.   prochlorperazine 10 MG tablet Commonly known as: COMPAZINE Take 1 tablet (10 mg total) by mouth every 6 (six) hours as needed.   ranolazine 500 MG 12 hr tablet Commonly known as: RANEXA Take 500 mg by mouth 2 (two) times daily.   rosuvastatin 20 MG tablet Commonly known as: CRESTOR TAKE 1 TABLET BY MOUTH EVERY DAY   traMADol 50 MG tablet Commonly known as: ULTRAM Take 50-100 mg by mouth every 6 (six) hours as needed for moderate pain or severe pain. What changed: Another medication with the same name was added. Make sure you understand how and when to take each.   traMADol 50 MG tablet Commonly known  as: ULTRAM Take 2 tablets (100 mg total) by mouth every 6 (six) hours as needed. What changed: You were already taking a medication with the same name, and this prescription was added. Make sure you understand how and when to take each.   traMADol 50 MG tablet Commonly known as: Ultram Take 1 tablet (50 mg total) by mouth every 6 (six) hours as needed. What changed: You were already taking a medication with the same name, and this  prescription was added. Make sure you understand how and when to take each.   Vitamin D3 50 MCG (2000 UT) capsule Take 2,000 Units by mouth daily.   Vitrum Senior Tabs Take 1 tablet by mouth daily.        Follow-up Information     Hamrick, Maura L, MD Follow up in 3 week(s).   Specialty: Family Medicine Contact information: [address] [place] [address] [phone]                 Signed: Ralene Ok 05/29/2021, 7:17 PM

## 2021-05-29 NOTE — ED Notes (Signed)
Breakfast provided.

## 2021-06-01 DIAGNOSIS — C189 Malignant neoplasm of colon, unspecified: Secondary | ICD-10-CM | POA: Diagnosis not present

## 2021-06-01 DIAGNOSIS — C787 Secondary malignant neoplasm of liver and intrahepatic bile duct: Secondary | ICD-10-CM | POA: Diagnosis not present

## 2021-06-03 DIAGNOSIS — Z961 Presence of intraocular lens: Secondary | ICD-10-CM | POA: Diagnosis not present

## 2021-06-03 DIAGNOSIS — Z947 Corneal transplant status: Secondary | ICD-10-CM | POA: Diagnosis not present

## 2021-06-07 ENCOUNTER — Telehealth: Payer: Self-pay | Admitting: Pulmonary Disease

## 2021-06-07 ENCOUNTER — Encounter: Payer: Self-pay | Admitting: Pulmonary Disease

## 2021-06-07 ENCOUNTER — Ambulatory Visit (INDEPENDENT_AMBULATORY_CARE_PROVIDER_SITE_OTHER): Payer: Medicare PPO

## 2021-06-07 ENCOUNTER — Other Ambulatory Visit: Payer: Self-pay

## 2021-06-07 ENCOUNTER — Ambulatory Visit (INDEPENDENT_AMBULATORY_CARE_PROVIDER_SITE_OTHER): Payer: Medicare PPO | Admitting: Pulmonary Disease

## 2021-06-07 VITALS — BP 144/68 | HR 85 | Temp 98.7°F | Ht 70.0 in | Wt 234.6 lb

## 2021-06-07 DIAGNOSIS — R091 Pleurisy: Secondary | ICD-10-CM | POA: Diagnosis not present

## 2021-06-07 DIAGNOSIS — J811 Chronic pulmonary edema: Secondary | ICD-10-CM | POA: Diagnosis not present

## 2021-06-07 DIAGNOSIS — R051 Acute cough: Secondary | ICD-10-CM

## 2021-06-07 DIAGNOSIS — I517 Cardiomegaly: Secondary | ICD-10-CM | POA: Diagnosis not present

## 2021-06-07 MED ORDER — PREDNISONE 20 MG PO TABS: 40.0000 mg | ORAL_TABLET | Freq: Every day | ORAL | 0 refills | Status: AC

## 2021-06-07 NOTE — Telephone Encounter (Signed)
Primary Pulmonologist: Hunsucker Last office visit and with whom: 03/10/2021 Hunsucker What do we see them for (pulmonary problems): DOE and Sarcoid Last OV assessment/plan:   Assessment & Plan:    Dyspnea on exertion: Imaging and PFTs reviewed.  Suspect is multifactorial.  Related to burnt out sarcoid, moderate restriction.  Possible contribution of asthma.  Breo does not seem very helpful recently.  Improved after drainage of right pleural effusion.  Also element of deconditioning, exercising more and feeling a bit better.  Encouraged ongoing exercise.   Asthma: Presumed diagnosis given atopic symptoms and dyspnea on exertion.  Does not seem like [employer] has been very helpful.  He stopped it for a week without any worsening.  Resumed it for a week without any improvement.  Is going to need to using and keeping a journal this time.  If not very helpful okay to discontinue.  May need a different ICS/LABA inhaler.   Sarcoidosis: Burnt out, no evidence of active disease on recent imaging.   Return in about 4 months (around 07/10/2021).     Lanier Clam, MD 03/10/2021            Patient Instructions by Lanier Clam, MD at 03/10/2021 4:00 PM  Author: Lanier Clam, MD Author Type: Physician Filed: 03/10/2021  4:31 PM  Note Status: Addendum Mickle Mallory: Cosign Not Required Encounter Date: 03/10/2021  Editor: Lanier Clam, MD (Physician)      Prior Versions: 1. Hunsucker, Bonna Gains, MD (Physician) at 03/10/2021  4:30 PM - Signed    Nice to see you again  Your lungs sound clear, no signs of fluid returning.  I think the fluid was related to inflammation in the liver as the chemotherapy was working that worsened with your surgery.   Feel free to trial Breo again and keep a journal to see if it really helps.  If it does not seem helpful as it has not to date, okay to discontinue this again.   Return to clinic in 4 months or sooner as needed       Orthostatic Vitals  Recorded in This Encounter   03/10/2021  1609     Patient Position: Sitting  BP Location: Left Arm  Cuff Size: Normal   Instructions    Return in about 4 months (around 07/10/2021).  Nice to see you again  Your lungs sound clear, no signs of fluid returning.  I think the fluid was related to inflammation in the liver as the chemotherapy was working that worsened with your surgery.   Feel free to trial Breo again and keep a journal to see if it really helps.  If it does not seem helpful as it has not to date, okay to discontinue this again.   Return to clinic in 4 months or sooner as needed      Was appointment offered to patient (explain)?  Yes, scheduled to see Dr. Silas Flood today at 3pm.   Reason for call: Pt recently had surgery for fluid on his lungs. States he is SOB again. Wonders if fluid could be back again.States he fell a week ago and landed on ribs. Fell on chest.  Went to ER, fell on head as well and got 9 stitches.  Stayed overnight and transferred to [facility] and then discharged.  Believes that's why he is SOB.  Pain on left side when takes a deep breath.  Sats are usually 98% and were 92% on yesterday.  Coughing up some green mucous and  is now dark green since before fall.  Denies any fever, chills or body aches.  No sick contacts.  Has not used his albuterol inhaler, sometimes helps.  Stopped the [employer].   (examples of things to ask: : When did symptoms start? Fever? Cough? Productive? Color to sputum? More sputum than usual? Wheezing? Have you needed increased oxygen? Are you taking your respiratory medications? What over the counter measures have you tried?)  No Known Allergies  Immunization History  Administered Date(s) Administered   DTaP / IPV 09/25/2012   Fluad Quad(high Dose 65+) 02/19/2019   Influenza Split 03/27/2014   Influenza, High Dose Seasonal PF 03/27/2014, 01/27/2017, 02/22/2018   Influenza-Unspecified 02/24/2017, 03/10/2020   PFIZER Comirnaty(Gray  Top)Covid-19 Tri-Sucrose Vaccine 06/27/2019   PFIZER(Purple Top)SARS-COV-2 Vaccination 07/11/2019, 07/21/2019, 01/26/2020, 10/26/2020   Pneumococcal Conjugate-13 03/27/2014   Pneumococcal Polysaccharide-23 11/10/2009   Pneumococcal-Unspecified 03/12/2014   Tdap 03/24/2011, 09/25/2012, 05/28/2021   Zoster Recombinat (Shingrix) 09/01/2018, 02/19/2019   Zoster, Live 03/21/2010

## 2021-06-07 NOTE — Patient Instructions (Signed)
Nice to see you again  Take prednisone 40 mg daily for 5 days   Chest xray today to evaluate for pneumonia. Reassuringly, lungs sounded clear on left.  I expect the pain to improve with time if xray is clear  Return to clinic in 3 months or sooner as needed

## 2021-06-08 ENCOUNTER — Ambulatory Visit: Payer: Medicare PPO | Admitting: Pulmonary Disease

## 2021-06-08 NOTE — Progress Notes (Signed)
@Patient  ID: Luis [name], male    DOB: [birth year], 80 y.o.   MRN: [id number]  Chief Complaint  Patient presents with   Follow-up    More sob today. Post fall about 1 week ago. (05-28-21) Some fluid noted in chest. When he fell he had some internal bleeding     Referring provider: Hamrick, Lorin Mercy, MD  HPI:   80 y.o. man whom we are seeing in follow-up of multiple pulmonary issues including burnt out sarcoid, asthma, ongoing chronic of right pleural effusion.  In interim since last visit, repeat thoracentesis was performed 04/04/2021.  Cytology negative for malignant cells.  Continues ongoing oncologic follow-up at [facility].  Interval scan demonstrated persistent right-sided pleural effusion.  Unfortunate, he had a fall 05/28/2021 while at the [place] at the [place].  Head contusion laceration status post suturing.  Also had a hematoma chest wall.  Since then he has ongoing left-sided chest/flank pain.  Reproducible on exam.  Some clicking heard with deep breaths.  Suspect skeletal, possibly muscular in nature.  He does have a bit worsening production of green phlegm over the last few days.  States he thinks is because he cannot often take deep breaths or cough adequately due to the pain.  Reviewed CT scan 05/28/2021 of the chest which on my review interpretation shows clear lungs, moderate right-sided pleural effusion slightly decreased in size from prior examination 01/2021.  HPI at initial visit: Overall, disorders are largely unchanged.  Formally followed with [name] for sarcoidosis.  This has been quiescent for some time.  Most recent CT scan reviewed which reveals on my interpretation stable bilateral fibrosis, scattered calcified granulomas in the lung as well as significant calcified hilar and mediastinal lymphadenopathy and sequela of prior lung surgery.  He has been seen in clinic recently by [name].  Primary complaint was dyspnea on exertion as well  as cough.  These persist.  Chest imaging obtained as above.  PFTs obtained last week which reveal suggestion of mild to moderate restriction on spirometry without significant bronchodilator response.  Moderate restriction confirmed with total lung capacity 69% of predicted.  DLCO corrects to within normal limits with adjustment for hemoglobin.  Cough and shortness of breath coincide with worsening nasal congestion.  Postnasal drip.  Not really using meds to address this this.  He is tolerating chemotherapy well.  Review of labs indicate stable hemoglobin without concern for symptomatic anemia at this time.   Questionaires / Pulmonary Flowsheets:   ACT:  No flowsheet data found.  MMRC: mMRC Dyspnea Scale mMRC Score  08/09/2020 0    Epworth:  No flowsheet data found.  Tests:   FENO:  No results found for: NITRICOXIDE  PFT: PFT Results Latest Ref Rng & Units 03/10/2020 08/30/2017  FVC-Pre L 2.94 3.14  FVC-Predicted Pre % 68 72  FVC-Post L 2.90 3.18  FVC-Predicted Post % 67 73  Pre FEV1/FVC % % 66 70  Post FEV1/FCV % % 68 73  FEV1-Pre L 1.93 2.19  FEV1-Predicted Pre % 62 69  FEV1-Post L 1.96 2.31  DLCO uncorrected ml/min/mmHg 19.10 17.73  DLCO UNC% % 75 52  DLCO corrected ml/min/mmHg 21.40 20.68  DLCO COR %Predicted % 84 61  DLVA Predicted % 118 98  TLC L 5.02 4.99  TLC % Predicted % 69 68  RV % Predicted % 76 71  Personally reviewed and interpreted as normal spirometry, reduced DLCO, moderate reduced TLC  WALK:  SIX MIN WALK 08/28/2017  Supplimental Oxygen during Test? (L/min) No  Tech Comments: pt walked a fast pace, tolerated walk well.     Imaging: Personally reviewed and as per EMR and discussion in this note  DG Chest 2 View  Result Date: 06/08/2021 CLINICAL DATA:  Left-sided pleurisy EXAM: CHEST - 2 VIEW COMPARISON:  05/28/2021 FINDINGS: Chronic right pleuroparenchymal disease. Prior left lobectomy. Left lung is clear. No pneumothorax. Calcified bilateral hilar  lymph nodes. Mild bilateral interstitial prominence. Stable cardiomegaly. Right-sided Port-A-Cath in satisfactory unchanged position. No acute osseous abnormality. IMPRESSION: No acute cardiopulmonary disease. Chronic right pleuroparenchymal disease unchanged from the prior exam. Cardiomegaly with mild pulmonary vascular congestion. Electronically Signed   By: Kathreen Devoid M.D.   On: 06/08/2021 08:31   DG Ribs Unilateral W/Chest Left  Result Date: 05/28/2021 CLINICAL DATA:  Fall today.  Complains of anterior left rib pain. EXAM: LEFT RIBS AND CHEST - 3+ VIEW COMPARISON:  Chest radiograph 04/04/2021; CT chest 01/22/2021 FINDINGS: No fracture or other bone lesions are seen involving the ribs. There is no evidence of pneumothorax or pleural effusion. Surgical staple seen in the right mid and lower lung. Chronic small right pleural effusion. The lungs are otherwise clear. Multiple calcified mediastinal lymph nodes, stable. Stable cardiomediastinal silhouette. Aortic calcifications. Power injectable right IJ central venous catheter tip projects at the level of the mid SVC. IMPRESSION: 1. No rib fracture. 2. Chronic small right pleural effusion. No new focal consolidation. 3.  Aortic Atherosclerosis (ICD10-I70.0). Electronically Signed   By: Ileana Roup M.D.   On: 05/28/2021 19:36   CT Head Wo Contrast  Result Date: 05/28/2021 CLINICAL DATA:  Recent trip and fall with headaches and neck pain, initial encounter EXAM: CT HEAD WITHOUT CONTRAST CT MAXILLOFACIAL WITHOUT CONTRAST CT CERVICAL SPINE WITHOUT CONTRAST TECHNIQUE: Multidetector CT imaging of the head, cervical spine, and maxillofacial structures were performed using the standard protocol without intravenous contrast. Multiplanar CT image reconstructions of the cervical spine and maxillofacial structures were also generated. COMPARISON:  None. FINDINGS: CT HEAD FINDINGS Brain: No evidence of acute infarction, hemorrhage, hydrocephalus, extra-axial  collection or mass lesion/mass effect. Chronic atrophic and ischemic changes are noted. Vascular: No hyperdense vessel or unexpected calcification. Skull: Normal. Negative for fracture or focal lesion. Other: Scalp swelling is noted in the left supraorbital region laterally. CT MAXILLOFACIAL FINDINGS Osseous: No acute bony abnormality is noted. Orbits: Orbits and their contents are within normal limits. Sinuses: Paranasal sinuses are clear. Soft tissues: Left periorbital region laterally and extending superiorly consistent with the recent injury. No other soft tissue abnormality is noted. CT CERVICAL SPINE FINDINGS Alignment: Within normal limits. Skull base and vertebrae: 7 cervical segments are well visualized. 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 Facet hypertrophic changes are noted. Varied levels of fusion are noted from C6-T2. Posterior fusion defect is noted at C4. No acute fracture or acute facet abnormality is noted. Soft tissues and spinal canal: Right chest wall port is noted. Surrounding soft tissue structures demonstrate vascular calcifications. No hematoma or mass lesion is seen. Upper chest: Visualized lung apices demonstrate scarring in the right apex similar to that seen on prior CT from August of 2022. Other: None IMPRESSION: CT of the head: No acute intracranial abnormality noted. CT of the maxillofacial bones: No acute fracture is seen. Soft tissue swelling in the left periorbital region and left scalp is seen. 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 Electronically Signed   By: Inez Catalina M.D.   On: 05/28/2021 19:51   CT Chest W Contrast  Result Date: 05/28/2021 CLINICAL DATA:  Trauma. Fall. History of metastatic colorectal cancer. EXAM: CT CHEST WITH CONTRAST TECHNIQUE: Multidetector CT imaging of the chest was performed during  intravenous contrast administration. CONTRAST:  90mL OMNIPAQUE IOHEXOL 350 MG/ML SOLN COMPARISON:  CT chest 01/22/2021. FINDINGS: Cardiovascular: Heart is mildly enlarged. There is no pericardial effusion. Aorta is normal in size. There are atherosclerotic calcifications of the aorta and coronary arteries. Central venous catheter tip ends at the cavoatrial junction. Mediastinum/Nodes: Calcified nodule in the right thyroid gland measures 6 mm and is unchanged. There are diffuse calcified mediastinal and hilar lymph nodes similar to the prior study. Esophagus is nondilated. There is stranding and heterogeneous hyperdensity in the anterior left mediastinum at the level of the heart concerning for mediastinal hematoma. No pneumomediastinum. Lungs/Pleura: There is a small to moderate-sized right pleural effusion which is likely partially loculated, similar to the prior examination. No pneumothorax. Right upper lobe parenchymal opacities and retraction appear similar to the prior examination. Surgical staple line in the right upper hemithorax is unchanged. Linear areas of scarring in the left upper lobe are similar to the prior study. Tree-in-bud opacities in the inferior left upper lobe are also similar from the prior examination. There are bilateral calcified nodules, unchanged. Trachea and central airways are patent. Musculoskeletal: No chest wall abnormality. No acute or significant osseous findings. IMPRESSION: 1. Small amount of anterior mediastinal hematoma at the level of the heart. No acute fractures are seen. 2. Stable loculated right pleural effusion. Stable chronic changes in both lungs. No new focal lung infiltrate. Electronically Signed   By: Ronney Asters M.D.   On: 05/28/2021 22:40   CT Cervical Spine Wo Contrast  Result Date: 05/28/2021 CLINICAL DATA:  Recent trip and fall with headaches and neck pain, initial encounter EXAM: CT HEAD WITHOUT CONTRAST CT MAXILLOFACIAL WITHOUT CONTRAST CT CERVICAL  SPINE WITHOUT CONTRAST TECHNIQUE: Multidetector CT imaging of the head, cervical spine, and maxillofacial structures were performed using the standard protocol without intravenous contrast. Multiplanar CT image reconstructions of the cervical spine and maxillofacial structures were also generated. COMPARISON:  None. FINDINGS: CT HEAD FINDINGS Brain: No evidence of acute infarction, hemorrhage, hydrocephalus, extra-axial collection or mass lesion/mass effect. Chronic atrophic and ischemic changes are noted. Vascular: No hyperdense vessel or unexpected calcification. Skull: Normal. Negative for fracture or focal lesion. Other: Scalp swelling is noted in the left supraorbital region laterally. CT MAXILLOFACIAL FINDINGS Osseous: No acute bony abnormality is noted. Orbits: Orbits and their contents are within normal limits. Sinuses: Paranasal sinuses are clear. Soft tissues: Left periorbital region laterally and extending superiorly consistent with the recent injury. No other soft tissue abnormality is noted. CT CERVICAL SPINE FINDINGS Alignment: Within normal limits. Skull base and vertebrae: 7 cervical segments are well visualized. 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 Facet hypertrophic changes are noted. Varied levels of fusion are noted from C6-T2. Posterior fusion defect is noted at C4. No acute fracture or acute facet abnormality is noted. Soft tissues and spinal canal: Right chest wall port is noted. Surrounding soft tissue structures demonstrate vascular calcifications. No hematoma or mass lesion is seen. Upper chest: Visualized lung apices demonstrate scarring in the right apex similar to that seen on prior CT  from August of 2022. Other: None IMPRESSION: CT of the head: No acute intracranial abnormality noted. CT of the maxillofacial bones: No acute fracture is seen. Soft tissue swelling in the left periorbital region and left  scalp is seen. 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 Electronically Signed   By: Inez Catalina M.D.   On: 05/28/2021 19:51   CT ABDOMEN PELVIS W CONTRAST  Result Date: 05/28/2021 CLINICAL DATA:  Blunt abdominal trauma, left flank pain. Metastatic colorectal cancer. EXAM: CT ABDOMEN AND PELVIS WITH CONTRAST TECHNIQUE: Multidetector CT imaging of the abdomen and pelvis was performed using the standard protocol following bolus administration of intravenous contrast. CONTRAST:  62mL OMNIPAQUE IOHEXOL 350 MG/ML SOLN COMPARISON:  04/26/2020, 06/30/2020, CT chest 01/22/2021 FINDINGS: Lower chest: Moderate right pleural effusion is present, unchanged from prior examination. Right basilar wedge resection x2 has been performed with stable resultant right-sided volume loss. Scarring within the right upper lobe is unchanged. Numerous subcentimeter nodules within the superior segment of the a right lower lobe appear grossly unchanged from prior examination. Extensive coronary artery stenting has been performed. Global cardiac size within normal limits. No pericardial effusion. There is infiltration of the pericardial fat at the left lung base most in keeping with interstitial hemorrhage without significant mass effect upon the cardiac structures. No mass forming hematoma identified. Hepatobiliary: Metastatic lesion within the right hepatic lobe appears smaller than on prior pre ablation imaging of 06/30/2020, now measuring 2.5 x 3.7 cm at axial image # 57/2. Fiducial markers and post ablated changes are noted involving the inferior right hepatic lobe. Lesion within the subserosal left hepatic lobe appears smaller, measuring 1.0 x 2.8 cm at axial image # 50/2 with retraction of the hepatic capsule. Cholecystectomy has been performed. Pancreas: Unremarkable Spleen: Unremarkable Adrenals/Urinary Tract: Adrenal glands are unremarkable. Kidneys are normal,  without renal calculi, focal lesion, or hydronephrosis. Bladder is unremarkable. Stomach/Bowel: Severe descending and sigmoid colonic diverticulosis without superimposed acute inflammatory change. Right hemicolectomy has been performed. No evidence of obstruction or focal inflammation. Stomach, small bowel, and relate to ol rib residual large bowel are unremarkable. No free intraperitoneal gas or fluid. Implanted intraperitoneal port catheter is seen with the catheter tip within the periportal region. Vascular/Lymphatic: Aortic atherosclerosis. No enlarged abdominal or pelvic lymph nodes. Reproductive: Brachytherapy seeds are seen within the prostate gland. Other: Tiny fat containing bilateral inguinal hernias are present. Musculoskeletal: No acute bone abnormality. IMPRESSION: Infiltration of the pericardial fat at the left lung base most in keeping with interstitial hemorrhage related to acute trauma. No mass forming hematoma identified. No significant mass effect upon the cardiac structures. No definite fracture is clearly identified. No acute intra-abdominal pathology identified. Interval response to therapy with post ablated changes involving the inferior right hepatic lesion and interval decrease in size within the superior right and left hepatic metastatic masses. Distal colonic diverticulosis without acute inflammatory change. Aortic Atherosclerosis (ICD10-I70.0). Electronically Signed   By: Fidela Salisbury M.D.   On: 05/28/2021 22:50   ECHOCARDIOGRAM COMPLETE  Result Date: 05/29/2021    ECHOCARDIOGRAM REPORT   Patient Name:   Luis [name] Date of Exam: 05/29/2021 Medical Rec #:  [id number]       Height:       70.0 in Accession #:    [id number]      Weight:       225.0 lb Date of Birth:  [birth year]      BSA:  2.194 m Patient Age:    76 years        BP:           155/74 mmHg Patient Gender: M               HR:           75 bpm. Exam Location:  Inpatient Procedure: 2D Echo, Color Doppler and  Cardiac Doppler Indications:    Trauma (see sonographer comments)  History:        Patient has prior history of Echocardiogram examinations, most                 recent 05/30/2016. CAD; Risk Factors:Hypertension and                 Dyslipidemia.  Sonographer:    Raquel Sarna Senior RDCS Referring Phys: [place]  1. Left ventricular ejection fraction, by estimation, is 60 to 65%. The left ventricle has normal function. The left ventricle has no regional wall motion abnormalities. There is mild concentric left ventricular hypertrophy. Left ventricular diastolic parameters are consistent with Grade I diastolic dysfunction (impaired relaxation).  2. Right ventricular systolic function is normal. The right ventricular size is normal.  3. The mitral valve is normal in structure. No evidence of mitral valve regurgitation. No evidence of mitral stenosis.  4. The aortic valve is tricuspid. There is mild calcification of the aortic valve. There is mild thickening of the aortic valve. Aortic valve regurgitation is not visualized. Aortic valve sclerosis is present, with no evidence of aortic valve stenosis.  5. There is mild dilatation of the ascending aorta, measuring 40 mm.  6. The inferior vena cava is normal in size with greater than 50% respiratory variability, suggesting right atrial pressure of 3 mmHg. Comparison(s): No significant change from prior study. Prior images reviewed side by side. FINDINGS  Left Ventricle: Left ventricular ejection fraction, by estimation, is 60 to 65%. The left ventricle has normal function. The left ventricle has no regional wall motion abnormalities. The left ventricular internal cavity size was normal in size. There is  mild concentric left ventricular hypertrophy. Left ventricular diastolic parameters are consistent with Grade I diastolic dysfunction (impaired relaxation). Indeterminate filling pressures. Right Ventricle: The right ventricular size is normal. No  increase in right ventricular wall thickness. Right ventricular systolic function is normal. Left Atrium: Left atrial size was normal in size. Right Atrium: Right atrial size was normal in size. Pericardium: There is no evidence of pericardial effusion. Presence of epicardial fat layer. Mitral Valve: The mitral valve is normal in structure. No evidence of mitral valve regurgitation. No evidence of mitral valve stenosis. Tricuspid Valve: The tricuspid valve is normal in structure. Tricuspid valve regurgitation is not demonstrated. No evidence of tricuspid stenosis. Aortic Valve: The aortic valve is tricuspid. There is mild calcification of the aortic valve. There is mild thickening of the aortic valve. Aortic valve regurgitation is not visualized. Aortic valve sclerosis is present, with no evidence of aortic valve stenosis. Pulmonic Valve: The pulmonic valve was normal in structure. Pulmonic valve regurgitation is not visualized. No evidence of pulmonic stenosis. Aorta: The aortic root is normal in size and structure. There is mild dilatation of the ascending aorta, measuring 40 mm. Venous: The inferior vena cava is normal in size with greater than 50% respiratory variability, suggesting right atrial pressure of 3 mmHg. IAS/Shunts: No atrial level shunt detected by color flow Doppler.  LEFT VENTRICLE PLAX 2D LVIDd:  4.00 cm   Diastology LVIDs:         2.70 cm   LV e' medial:    5.98 cm/s LV PW:         1.40 cm   LV E/e' medial:  12.3 LV IVS:        1.20 cm   LV e' lateral:   6.96 cm/s LVOT diam:     2.10 cm   LV E/e' lateral: 10.6 LV SV:         93 LV SV Index:   42 LVOT Area:     3.46 cm  RIGHT VENTRICLE RV S prime:     12.10 cm/s TAPSE (M-mode): 2.0 cm LEFT ATRIUM             Index        RIGHT ATRIUM           Index LA diam:        3.20 cm 1.46 cm/m   RA Area:     14.10 cm LA Vol (A2C):   54.1 ml 24.65 ml/m  RA Volume:   30.40 ml  13.85 ml/m LA Vol (A4C):   66.7 ml 30.39 ml/m LA Biplane Vol: 62.6 ml  28.53 ml/m  AORTIC VALVE LVOT Vmax:   126.00 cm/s LVOT Vmean:  88.300 cm/s LVOT VTI:    0.268 m  AORTA Ao Root diam: 3.70 cm Ao Asc diam:  4.00 cm MITRAL VALVE MV Area (PHT): 2.42 cm     SHUNTS MV Decel Time: 314 msec     Systemic VTI:  0.27 m MV E velocity: 73.70 cm/s   Systemic Diam: 2.10 cm MV A velocity: 109.00 cm/s MV E/A ratio:  0.68 Mihai Croitoru MD Electronically signed by Sanda Klein MD Signature Date/Time: 05/29/2021/2:14:30 PM    Final    VAS US CAROTID  Result Date: 05/18/2021 Carotid Arterial Duplex Study Patient Name:  Luis [name]  Date of Exam:   05/17/2021 Medical Rec #: [id number]        Accession #:    [id number] Date of Birth: 1941-02-22       Patient Gender: M Patient Age:   81 years Exam Location:  [facility] Procedure:      VAS US CAROTID Referring Phys: Larae Grooms --------------------------------------------------------------------------------  Indications:       Carotid artery disease.Patient denies any cerebrovascular                    symptoms. Risk Factors:      Hypertension, hyperlipidemia, current smoker, coronary artery                    disease. Limitations        Today's exam was limited due to patient limited mobility of                    neck (neck fusion). Comparison Study:  Previous carotid duplex 05/17/20 showed right CCA and ICA                    occlusion and LICA velocities of 58/09 cm/sec. Performing Technologist: Mariane Masters RVT  Examination Guidelines: A complete evaluation includes B-mode imaging, spectral Doppler, color Doppler, and power Doppler as needed of all accessible portions of each vessel. Bilateral testing is considered an integral part of a complete examination. Limited examinations for reoccurring indications may be performed as noted.  Right Carotid Findings: +----------+--------+--------+--------+------------------------+--------------+  PSV cm/s EDV cm/s Stenosis Plaque Description       Comments         +----------+--------+--------+--------+------------------------+--------------+  CCA Prox   0        0        Occluded heterogenous                             +----------+--------+--------+--------+------------------------+--------------+  CCA Distal 0        0        Occluded                                          +----------+--------+--------+--------+------------------------+--------------+  ICA Prox   0        0        Occluded diffuse and heterogenous                 +----------+--------+--------+--------+------------------------+--------------+  ICA Mid    0        0        Occluded                                          +----------+--------+--------+--------+------------------------+--------------+  ICA Distal 0        0        Occluded                                          +----------+--------+--------+--------+------------------------+--------------+  ECA        31       0                                          low velocities  +----------+--------+--------+--------+------------------------+--------------+ +----------+--------+-------+----------------+-------------------+             PSV cm/s EDV cms Describe         Arm Pressure (mmHG)  +----------+--------+-------+----------------+-------------------+  Subclavian 83               Multiphasic, WNL                      +----------+--------+-------+----------------+-------------------+ +---------+--------+--+--------+--+---------+  Vertebral PSV cm/s 66 EDV cm/s 14 Antegrade  +---------+--------+--+--------+--+---------+  Left Carotid Findings: +----------+--------+--------+--------+------------------+--------+             PSV cm/s EDV cm/s Stenosis Plaque Description Comments  +----------+--------+--------+--------+------------------+--------+  CCA Prox   131      35                                             +----------+--------+--------+--------+------------------+--------+  CCA Distal 99       23                                              +----------+--------+--------+--------+------------------+--------+  ICA Prox   98  33       1-39%    heterogenous                 +----------+--------+--------+--------+------------------+--------+  ICA Mid    96       36                                             +----------+--------+--------+--------+------------------+--------+  ICA Distal 97       33                                             +----------+--------+--------+--------+------------------+--------+  ECA        176      20                                             +----------+--------+--------+--------+------------------+--------+ +----------+--------+--------+----------------+-------------------+             PSV cm/s EDV cm/s Describe         Arm Pressure (mmHG)  +----------+--------+--------+----------------+-------------------+  Subclavian 123               Multiphasic, WNL                      +----------+--------+--------+----------------+-------------------+ +---------+--------+--+--------+--+---------+  Vertebral PSV cm/s 59 EDV cm/s 10 Antegrade  +---------+--------+--+--------+--+---------+   Summary: 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 Left Carotid: Velocities in the left ICA are consistent with a 1-39% stenosis. Vertebrals:  Bilateral vertebral arteries demonstrate antegrade flow. Subclavians: Normal flow hemodynamics were seen in bilateral subclavian              arteries. *See table(s) above for measurements and observations. Suggest follow up study in 12 months. Electronically signed by Kathlyn Sacramento MD on 05/18/2021 at 5:02:05 PM.    Final    CT Maxillofacial Wo Contrast  Result Date: 05/28/2021 CLINICAL DATA:  Recent trip and fall with headaches and neck pain, initial encounter EXAM: CT HEAD WITHOUT CONTRAST CT MAXILLOFACIAL WITHOUT CONTRAST CT CERVICAL SPINE WITHOUT CONTRAST TECHNIQUE: Multidetector CT imaging of the head, cervical spine, and maxillofacial structures were performed using the standard  protocol without intravenous contrast. Multiplanar CT image reconstructions of the cervical spine and maxillofacial structures were also generated. COMPARISON:  None. FINDINGS: CT HEAD FINDINGS Brain: No evidence of acute infarction, hemorrhage, hydrocephalus, extra-axial collection or mass lesion/mass effect. Chronic atrophic and ischemic changes are noted. Vascular: No hyperdense vessel or unexpected calcification. Skull: Normal. Negative for fracture or focal lesion. Other: Scalp swelling is noted in the left supraorbital region laterally. CT MAXILLOFACIAL FINDINGS Osseous: No acute bony abnormality is noted. Orbits: Orbits and their contents are within normal limits. Sinuses: Paranasal sinuses are clear. Soft tissues: Left periorbital region laterally and extending superiorly consistent with the recent injury. No other soft tissue abnormality is noted. CT CERVICAL SPINE FINDINGS Alignment: Within normal limits. Skull base and vertebrae: 7 cervical segments are well visualized. 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 Facet hypertrophic changes are noted. Varied levels of fusion are  noted from C6-T2. Posterior fusion defect is noted at C4. No acute fracture or acute facet abnormality is noted. Soft tissues and spinal canal: Right chest wall port is noted. Surrounding soft tissue structures demonstrate vascular calcifications. No hematoma or mass lesion is seen. Upper chest: Visualized lung apices demonstrate scarring in the right apex similar to that seen on prior CT from August of 2022. Other: None IMPRESSION: CT of the head: No acute intracranial abnormality noted. CT of the maxillofacial bones: No acute fracture is seen. Soft tissue swelling in the left periorbital region and left scalp is seen. 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 Electronically Signed   By:  Inez Catalina M.D.   On: 05/28/2021 19:51    Lab Results: Personally reviewed, eosinophils routinely 200-300 CBC    Component Value Date/Time   WBC 4.7 05/29/2021 0359   RBC 4.05 (L) 05/29/2021 0359   HGB 10.2 (L) 05/29/2021 0359   HGB 12.1 (L) 06/07/2020 1119   HGB 15.5 12/25/2018 1306   HCT 33.1 (L) 05/29/2021 0359   HCT 45.7 12/25/2018 1306   PLT 148 (L) 05/29/2021 0359   PLT 138 (L) 06/07/2020 1119   PLT 142 (L) 12/25/2018 1306   MCV 81.7 05/29/2021 0359   MCV 92 12/25/2018 1306   MCH 25.2 (L) 05/29/2021 0359   MCHC 30.8 05/29/2021 0359   RDW 19.9 (H) 05/29/2021 0359   RDW 14.4 12/25/2018 1306   LYMPHSABS 0.4 (L) 05/29/2021 0054   MONOABS 1.0 05/29/2021 0054   EOSABS 0.1 05/29/2021 0054   BASOSABS 0.0 05/29/2021 0054    BMET    Component Value Date/Time   NA 136 05/29/2021 0359   NA 138 12/25/2018 1306   K 3.7 05/29/2021 0359   CL 103 05/29/2021 0359   CO2 27 05/29/2021 0359   GLUCOSE 112 (H) 05/29/2021 0359   BUN 15 05/29/2021 0359   BUN 15 12/25/2018 1306   CREATININE 0.78 05/29/2021 0359   CREATININE 0.83 06/07/2020 1119   CREATININE 1.02 05/16/2016 0937   CALCIUM 8.5 (L) 05/29/2021 0359   GFRNONAA >60 05/29/2021 0359   GFRNONAA >60 06/07/2020 1119   GFRAA >60 03/10/2020 0855    BNP    Component Value Date/Time   BNP 79.4 01/22/2021 1634   BNP 43.4 05/16/2016 0937    ProBNP    Component Value Date/Time   PROBNP 60.0 08/10/2014 1052    Specialty Problems       Pulmonary Problems   Pulmonary fibrosis (HCC)   DOE (dyspnea on exertion)    No Known Allergies  Immunization History  Administered Date(s) Administered   DTaP / IPV 09/25/2012   Fluad Quad(high Dose 65+) 02/19/2019   Influenza Split 03/27/2014   Influenza, High Dose Seasonal PF 03/27/2014, 01/27/2017, 02/22/2018, 03/12/2021   Influenza-Unspecified 02/24/2017, 03/10/2020   PFIZER Comirnaty(Gray Top)Covid-19 Tri-Sucrose Vaccine 06/27/2019, 07/21/2019   PFIZER(Purple  Top)SARS-COV-2 Vaccination 07/11/2019, 07/21/2019, 01/26/2020, 10/26/2020   Pneumococcal Conjugate-13 03/27/2014   Pneumococcal Polysaccharide-23 11/10/2009   Pneumococcal-Unspecified 03/12/2014   Tdap 03/24/2011, 09/25/2012, 05/28/2021   Zoster Recombinat (Shingrix) 09/01/2018, 02/19/2019   Zoster, Live 03/21/2010    Past Medical History:  Diagnosis Date   Adenocarcinoma of 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 08/2014 Cath/PCI: LM nl,  LAD 30p, D1 95 (2.25x12 Resolute Integrity DES), LCX small, RI 100 (attempted PCI) - branches fill via L->L collats, RCA dominant, nl, RPDA/PLA nl, EF 60^. b. 02/24/2015 PCI CTO of Ramus DES x2.   Chronic bronchitis ([place])    hx   Diverticulosis 05/2005   Elevated lipase    Fuchs' corneal dystrophy    History of adenomatous polyp of colon 05/2005   8 mm adenoma   History of blood transfusion    "related to some of my surgeries"   Hyperlipidemia    Hypertension    Iron deficiency anemia due to chronic blood loss 06/21/2017   Pneumonia    Prostate cancer ([place]) 2011   S/P seed implant   Sarcoidosis    Thrombocytopenia ([place])    a. Noted on prior labs, unclear of what w/u done.   Trifascicular block     Tobacco History: Social History   Tobacco Use  Smoking Status Light Smoker   Packs/day: 0.00   Years: 59.00   Pack years: 0.00   Types: Cigars, Cigarettes   Last attempt to quit: 02/24/2015   Years since quitting: 6.2  Smokeless Tobacco Never  Tobacco Comments   02/24/2015 "quit cigarettes ~ 2000 ago but smokes cigars once/month"verified 03/10/21. Once in a while.    Ready to quit: Not Answered Counseling given: Not Answered Tobacco comments: 02/24/2015 "quit cigarettes ~ 2000 ago but smokes cigars once/month"verified 03/10/21. Once in a while.    Continue to not smoke  Outpatient Encounter  Medications as of 06/07/2021  Medication Sig   acetaminophen (TYLENOL) 325 MG tablet Take 650 mg by mouth every 6 (six) hours as needed for moderate pain.   Cholecalciferol (VITAMIN D3) 2000 units capsule Take 2,000 Units by mouth daily.   clobetasol (TEMOVATE) 0.05 % external solution Apply 1 application topically daily as needed (rash).   clopidogrel (PLAVIX) 75 MG tablet Take 75 mg by mouth daily.   furosemide (LASIX) 20 MG tablet Take 20 mg by mouth.   gabapentin (NEURONTIN) 100 MG capsule Take 200 mg by mouth daily as needed (neuropathy).   ibuprofen (ADVIL) 100 MG tablet Take 100 mg by mouth every 6 (six) hours as needed for pain.   Multiple Vitamins-Minerals (VITRUM SENIOR) TABS Take 1 tablet by mouth daily.   naproxen sodium (ALEVE) 220 MG tablet Take 440 mg by mouth daily as needed (pain).   nitroGLYCERIN (NITROSTAT) 0.4 MG SL tablet PLACE 1 TABLET UNDER THE TONGUE EVERY 5 MINUTES AS NEEDED FOR CHEST PAIN FOR 3 DOSES (Patient taking differently: Place 0.4 mg under the tongue every 5 (five) minutes as needed.)   predniSONE (DELTASONE) 20 MG tablet Take 2 tablets (40 mg total) by mouth daily with breakfast for 5 days.   ranolazine (RANEXA) 500 MG 12 hr tablet Take 500 mg by mouth 2 (two) times daily.   rosuvastatin (CRESTOR) 20 MG tablet TAKE 1 TABLET BY MOUTH EVERY DAY (Patient taking differently: Take 20 mg by mouth daily.)   traMADol (ULTRAM) 50 MG tablet Take 2 tablets (100 mg total) by mouth every 6 (six) hours as needed.   [DISCONTINUED] traMADol (ULTRAM) 50 MG tablet Take 50-100 mg by mouth every 6 (six) hours as needed for moderate pain or severe pain.   [DISCONTINUED] traMADol (ULTRAM) 50 MG tablet Take 1 tablet (50 mg total) by mouth every 6 (six) hours as needed.   albuterol (VENTOLIN HFA) 108 (90 Base) MCG/ACT inhaler Inhale 1-2 puffs into the lungs every 6 (six) hours as needed  for wheezing or shortness of breath. (Patient not taking: Reported on 06/07/2021)   fluticasone  furoate-vilanterol (BREO ELLIPTA) 200-25 MCG/INH AEPB Inhale 1 puff into the lungs daily. (Patient not taking: Reported on 06/07/2021)   [DISCONTINUED] prochlorperazine (COMPAZINE) 10 MG tablet Take 1 tablet (10 mg total) by mouth every 6 (six) hours as needed. (Patient not taking: Reported on 06/07/2021)   No facility-administered encounter medications on file as of 06/07/2021.     Review of Systems  N/a  Physical Exam  BP (!) 144/68 (BP Location: Left Arm, Patient Position: Sitting, Cuff Size: Normal)    Pulse 85    Temp 98.7 F (37.1 C) (Oral)    Ht 5\' 10"  (1.778 m)    Wt 234 lb 9.6 oz (106.4 kg)    SpO2 98%    BMI 33.66 kg/m   Wt Readings from Last 5 Encounters:  06/07/21 234 lb 9.6 oz (106.4 kg)  05/28/21 225 lb (102.1 kg)  04/04/21 225 lb (102.1 kg)  03/10/21 231 lb 6.4 oz (105 kg)  03/08/21 229 lb (103.9 kg)    BMI Readings from Last 5 Encounters:  06/07/21 33.66 kg/m  05/28/21 32.28 kg/m  04/04/21 32.28 kg/m  03/10/21 33.20 kg/m  03/08/21 32.86 kg/m     Physical Exam General: Well-appearing, no acute distress Eyes: EOMI, no icterus Neck: No JVD supple Respiratory:L clear to auscultation bilaterally, no wheezes or crackles, diminished in the right base Cardiovascular: Regular rhythm, no murmurs Abdomen: Nondistended, bowel sounds present Psych: Normal mood, full affect   Assessment & Plan:   Chest pain: Some clicking at times on exam, concern for rib subluxation.  No evidence of fractures on recent imaging.  Chest x-ray for further evaluation, low concern for pneumonia given lack of symptoms other than mild cough but will evaluate for this as he is at risk for developing pneumonia in the setting of worsened airway clearance due to pain.  Asthma: Presumed diagnosis given atopic symptoms and dyspnea on exertion.  Concern for flare given worsening cough in the setting of pain, reduced natural airway clearance.  Prednisone 40 mg a day x5 days.  Sarcoidosis:  Burnt out, no evidence of active disease on recent imaging.  Return in about 3 months (around 09/05/2021).   Lanier Clam, MD 06/08/2021

## 2021-06-08 NOTE — Progress Notes (Signed)
CXR is clear, no evidence of pneumonia. Please let Luis [name] know it looks ok. Thanks!

## 2021-06-09 DIAGNOSIS — C787 Secondary malignant neoplasm of liver and intrahepatic bile duct: Secondary | ICD-10-CM | POA: Diagnosis not present

## 2021-06-14 DIAGNOSIS — C787 Secondary malignant neoplasm of liver and intrahepatic bile duct: Secondary | ICD-10-CM | POA: Diagnosis not present

## 2021-06-15 DIAGNOSIS — C787 Secondary malignant neoplasm of liver and intrahepatic bile duct: Secondary | ICD-10-CM | POA: Diagnosis not present

## 2021-06-16 DIAGNOSIS — C787 Secondary malignant neoplasm of liver and intrahepatic bile duct: Secondary | ICD-10-CM | POA: Diagnosis not present

## 2021-06-17 DIAGNOSIS — C787 Secondary malignant neoplasm of liver and intrahepatic bile duct: Secondary | ICD-10-CM | POA: Diagnosis not present

## 2021-06-20 DIAGNOSIS — C787 Secondary malignant neoplasm of liver and intrahepatic bile duct: Secondary | ICD-10-CM | POA: Diagnosis not present

## 2021-06-21 ENCOUNTER — Other Ambulatory Visit: Payer: Self-pay | Admitting: Interventional Cardiology

## 2021-06-21 ENCOUNTER — Encounter: Payer: Self-pay | Admitting: Interventional Cardiology

## 2021-06-23 ENCOUNTER — Encounter: Payer: Self-pay | Admitting: Pulmonary Disease

## 2021-06-23 MED ORDER — AMLODIPINE BESYLATE 5 MG PO TABS: 5.0000 mg | ORAL_TABLET | Freq: Every day | ORAL | 3 refills | Status: DC

## 2021-06-24 DIAGNOSIS — J45909 Unspecified asthma, uncomplicated: Secondary | ICD-10-CM | POA: Diagnosis not present

## 2021-06-24 DIAGNOSIS — C189 Malignant neoplasm of colon, unspecified: Secondary | ICD-10-CM | POA: Diagnosis not present

## 2021-06-24 DIAGNOSIS — I739 Peripheral vascular disease, unspecified: Secondary | ICD-10-CM | POA: Diagnosis not present

## 2021-06-24 DIAGNOSIS — I25119 Atherosclerotic heart disease of native coronary artery with unspecified angina pectoris: Secondary | ICD-10-CM | POA: Diagnosis not present

## 2021-06-24 DIAGNOSIS — D6869 Other thrombophilia: Secondary | ICD-10-CM | POA: Diagnosis not present

## 2021-06-24 DIAGNOSIS — G8929 Other chronic pain: Secondary | ICD-10-CM | POA: Diagnosis not present

## 2021-06-24 DIAGNOSIS — I4891 Unspecified atrial fibrillation: Secondary | ICD-10-CM | POA: Diagnosis not present

## 2021-06-24 DIAGNOSIS — E785 Hyperlipidemia, unspecified: Secondary | ICD-10-CM | POA: Diagnosis not present

## 2021-06-24 DIAGNOSIS — I10 Essential (primary) hypertension: Secondary | ICD-10-CM | POA: Diagnosis not present

## 2021-06-27 ENCOUNTER — Encounter: Payer: Self-pay | Admitting: Primary Care

## 2021-06-27 ENCOUNTER — Ambulatory Visit: Payer: Medicare PPO | Admitting: Primary Care

## 2021-06-27 ENCOUNTER — Other Ambulatory Visit: Payer: Self-pay

## 2021-06-27 DIAGNOSIS — J45901 Unspecified asthma with (acute) exacerbation: Secondary | ICD-10-CM

## 2021-06-27 DIAGNOSIS — J4521 Mild intermittent asthma with (acute) exacerbation: Secondary | ICD-10-CM | POA: Diagnosis not present

## 2021-06-27 HISTORY — DX: Unspecified asthma with (acute) exacerbation: J45.901

## 2021-06-27 MED ORDER — PREDNISONE 20 MG PO TABS: 20.0000 mg | ORAL_TABLET | Freq: Every day | ORAL | 0 refills | Status: DC

## 2021-06-27 MED ORDER — DOXYCYCLINE HYCLATE 100 MG PO TABS: 100.0000 mg | ORAL_TABLET | Freq: Two times a day (BID) | ORAL | 0 refills | Status: DC

## 2021-06-27 NOTE — Patient Instructions (Addendum)
Recommendations: Stop theraflu  Continue robitussin every 4-6 hours as needed for cough/congestion Start flonase nasal spray daily (over the counter)  Rx: Doxycycline 100mg  twice daily x 7 days Prednisone taper 20mg  x 5 days   Follow-up: Return/call if symptoms do not improve/ may want to try a difference inhaler other than BREO if cough does not resolve

## 2021-06-27 NOTE — Assessment & Plan Note (Addendum)
-   Patient developed productive cough 10 days ago with associated PND. He has chronic dyspnea symptoms. Not currently using BREO d.t lack of perceived benefit, however, he does report responding to prn albuterol and oral prednisone. Lungs were clear on exam and VSS. Sending in Rx doxycycline 100mg  BID x 7 days and prednisone 20mg  x 5 days. Recommend he continue Robitussin q 4 hours prn cough. Start flonase nasal spray 1 spray per nostril daily. If cough does not improve or resolve would recommend resuming a different ICS/LABA.

## 2021-06-27 NOTE — Progress Notes (Signed)
@Patient  ID: Luis [name], male    DOB: [birth year], 81 y.o.   MRN: [id number]  Chief Complaint  Patient presents with   Follow-up    Patient says if he talks too much he starts coughing. Patient has had the cough for 10-12 days and it isn't getting any better.     Referring provider: [name], MD  HPI: 81 year old male, current smoker. PMH significant for Sarcoidosis, presumed asthma, right sided pleural effusion, HTN, CAD, carotid stenosis, colon cancer with liver metastases. Patient of Dr. Silas [name].  Previous [facility] pulmonary encounter: 10/07/20- Dr. Carmon [name] Luis [name] is a 81 year old man with past medical history significant for sarcoidosis treated for years of prednisone currently in remission, significant coronary artery disease status post multiple drug-eluting stents, currently on chemotherapy for adenocarcinoma of the cecum whom we are seeing in follow-up for dyspnea on exertion. Recent note from surgeon in [facility] system reviewed.  Most recent oncology note reviewed.    Doing ok. Dyspnea much better on Breo. Still with nsasal congestion. Wants to decrease Breo since DOE doing better.    HPI at initial visit: Overall, disorders are largely unchanged.  Formally followed with Luis [name] for sarcoidosis.  This has been quiescent for some time.  Most recent CT scan reviewed which reveals on my interpretation stable bilateral fibrosis, scattered calcified granulomas in the lung as well as significant calcified hilar and mediastinal lymphadenopathy and sequela of prior lung surgery.   He has been seen in clinic recently by Luis [name].  Primary complaint was dyspnea on exertion as well as cough.  These persist.  Chest imaging obtained as above.  PFTs obtained last week which reveal suggestion of mild to moderate restriction on spirometry without significant bronchodilator response.  Moderate restriction confirmed with total lung capacity 69% of predicted.  DLCO corrects to within  normal limits with adjustment for hemoglobin.   Cough and shortness of breath coincide with worsening nasal congestion.  Postnasal drip.  Not really using meds to address this this.  He is tolerating chemotherapy well.  Review of labs indicate stable hemoglobin without concern for symptomatic anemia at this time.   02/07/2021 Patient presents today for follow-up. Since his last visit with Dr. Silas [name] he had covid/pneumonia in June 2022 which was treated with paxlovid and prednisone. He also underwent diagnostic laparoscopy, robot assisted partial hepatectomy on 01/06/21. He will need surveillance CT in 2 months. He reports increased shortness of breath over the last several weeks. He states that it hurts to take a deep breath. He had a CTA on 01/22/21 that was negative for PE, he did have moderate right side pleural effusion which is partially loculated. No consolidation.  He saw cardiology on 02/02/21 for anginal symptoms which started after liver resection. Lasix has helped considerably in the past. He has 10mg  lasix prescription from his PCP. He does not have a lot of leg swelling.   03/10/21- Dr. Silas [name]  Interim since last seeing me, he has continued chemotherapy via pump that is implanted.  He had good response.  He had surgery to resect tumor in his liver.  Before the surgery, I have noted to have small pleural effusion on the right.  This worsened after the surgery.  Has worsening shortness of breath, dyspnea exertion.  Breo was not very effective.  He underwent thoracentesis 02/11/2021 that revealed exudative effusion via protein, lymphocyte predominant, cytology negative.  After removal of fluid, he feels much better.  600 cc of serous  fluid was removed.  Procedure performed by me.  06/07/21- Dr. Silas [name]   In interim since last visit, repeat thoracentesis was performed 04/04/2021.  Cytology negative for malignant cells.  Continues ongoing oncologic follow-up at [facility].  Interval scan demonstrated  persistent right-sided pleural effusion.  Unfortunate, he had a fall 05/28/2021 while [place] the [place] the [place].  Head contusion laceration status post suturing.  Also had a hematoma chest wall.  Since then he has ongoing left-sided chest/flank pain.  Reproducible on exam.  Some clicking heard with deep breaths.  Suspect skeletal, possibly muscular in nature.  He does have a bit worsening production of green phlegm over the last few days.  States he thinks is because he cannot often take deep breaths or cough adequately due to the pain.  Reviewed CT scan 05/28/2021 of the chest which on my review interpretation shows clear lungs, moderate right-sided pleural effusion slightly decreased in size from prior examination 01/2021.  06/27/2021- Interim hx  Patient presents today for acute OV. HX sarcoid, asthma and chronic right pleural effusion. He reports having a new cough x 10-12 days. He has some associated nasal post drip and wheezing. He gets up quarter size amount of green mucus in the morning. He coughs when speaking for long periods of time. He is no longer taking Breo 214mcg because he reports that it was not effective. Albuterol does help with his breathing. No change in his breathing. He does not feel fluid has re-accumulated in his lungs. CXR on 06/08/21 showed no acute process, chronic right pleuroparenchymal disease unchanged. Cardiomegaly with mild pulmonary vascular congestion. Denies f/c/s, chest tightness, reflux symptoms, N/V/D.    No Known Allergies  Immunization History  Administered Date(s) Administered   DTaP / IPV 09/25/2012   Fluad Quad(high Dose 65+) 02/19/2019   Influenza Split 03/27/2014   Influenza, High Dose Seasonal PF 03/27/2014, 01/27/2017, 02/22/2018, 03/12/2021   Influenza-Unspecified 02/24/2017, 03/10/2020   PFIZER Comirnaty(Gray Top)Covid-19 Tri-Sucrose Vaccine 06/27/2019, 07/21/2019   PFIZER(Purple Top)SARS-COV-2 Vaccination 07/11/2019,  07/21/2019, 01/26/2020, 10/26/2020   Pneumococcal Conjugate-13 03/27/2014   Pneumococcal Polysaccharide-23 11/10/2009   Pneumococcal-Unspecified 03/12/2014   Tdap 03/24/2011, 09/25/2012, 05/28/2021   Zoster Recombinat (Shingrix) 09/01/2018, 02/19/2019   Zoster, Live 03/21/2010    Past Medical History:  Diagnosis Date   Adenocarcinoma of cecum ([place]) 06/21/2017   Arthritis    "mid back; hands; knees" (02/24/2015)   Basal cell carcinoma    left shoulder; mid chest; right eyelid (02/24/2015)   CAD (coronary artery disease)    a. 08/2014 Cath/PCI: LM nl, LAD 30p, D1 95 (2.25x12 Resolute Integrity DES), LCX small, RI 100 (attempted PCI) - branches fill via L->L collats, RCA dominant, nl, RPDA/PLA nl, EF 60^. b. 02/24/2015 PCI CTO of Ramus DES x2.   Chronic bronchitis ([place])    hx   Diverticulosis 05/2005   Elevated lipase    Fuchs' corneal dystrophy    History of adenomatous polyp of colon 05/2005   8 mm adenoma   History of blood transfusion    "related to some of my surgeries"   Hyperlipidemia    Hypertension    Iron deficiency anemia due to chronic blood loss 06/21/2017   Pneumonia    Prostate cancer ([place]) 2011   S/P seed implant   Sarcoidosis    Thrombocytopenia ([place])    a. Noted on prior labs, unclear of what w/u done.   Trifascicular block     Tobacco History: Social History   Tobacco Use  Smoking Status Light Smoker   Packs/day: 0.00   Years: 59.00   Pack years: 0.00   Types: Cigars, Cigarettes   Last attempt to quit: 02/24/2015   Years since quitting: 6.3  Smokeless Tobacco Never  Tobacco Comments   02/24/2015 "quit cigarettes ~ 2000 ago but smokes cigars once/month"verified 03/10/21. Once in a while.    Ready to quit: Not Answered Counseling given: Not Answered Tobacco comments: 02/24/2015 "quit cigarettes ~ 2000 ago but smokes cigars once/month"verified 03/10/21. Once in a while.    Outpatient Medications Prior to Visit  Medication Sig Dispense Refill    acetaminophen (TYLENOL) 325 MG tablet Take 650 mg by mouth every 6 (six) hours as needed for moderate pain.     albuterol (VENTOLIN HFA) 108 (90 Base) MCG/ACT inhaler Inhale 1-2 puffs into the lungs every 6 (six) hours as needed for wheezing or shortness of breath. 1 each 0   amLODipine (NORVASC) 5 MG tablet Take 1 tablet (5 mg total) by mouth daily. 90 tablet 3   Cholecalciferol (VITAMIN D3) 2000 units capsule Take 2,000 Units by mouth daily.     clobetasol (TEMOVATE) 0.05 % external solution Apply 1 application topically daily as needed (rash).     clopidogrel (PLAVIX) 75 MG tablet Take 75 mg by mouth daily.     furosemide (LASIX) 20 MG tablet Take 20 mg by mouth.     gabapentin (NEURONTIN) 100 MG capsule Take 200 mg by mouth daily as needed (neuropathy).     ibuprofen (ADVIL) 100 MG tablet Take 100 mg by mouth every 6 (six) hours as needed for pain.     Multiple Vitamins-Minerals (VITRUM SENIOR) TABS Take 1 tablet by mouth daily.     naproxen sodium (ALEVE) 220 MG tablet Take 440 mg by mouth daily as needed (pain).     nitroGLYCERIN (NITROSTAT) 0.4 MG SL tablet PLACE 1 TABLET UNDER THE TONGUE EVERY 5 MINUTES AS NEEDED FOR CHEST PAIN FOR 3 DOSES (Patient taking differently: Place 0.4 mg under the tongue every 5 (five) minutes as needed.) 25 tablet 3   ranolazine (RANEXA) 500 MG 12 hr tablet Take 500 mg by mouth 2 (two) times daily.     rosuvastatin (CRESTOR) 20 MG tablet TAKE 1 TABLET BY MOUTH EVERY DAY (Patient taking differently: Take 20 mg by mouth daily.) 90 tablet 2   traMADol (ULTRAM) 50 MG tablet Take 2 tablets (100 mg total) by mouth every 6 (six) hours as needed. 10 tablet 0   fluticasone furoate-vilanterol (BREO ELLIPTA) 200-25 MCG/INH AEPB Inhale 1 puff into the lungs daily. (Patient not taking: Reported on 06/07/2021) 180 each 11   No facility-administered medications prior to visit.    Review of Systems  Review of Systems  Constitutional: Negative.   HENT:  Positive for  congestion and postnasal drip.   Respiratory:  Positive for cough. Negative for chest tightness, shortness of breath and wheezing.     Physical Exam  BP 128/60 (BP Location: Left Arm, Patient Position: Sitting, Cuff Size: Normal)    Pulse 84    Temp 97.7 F (36.5 C) (Oral)    Ht 5\' 10"  (1.778 m)    Wt 234 lb 6.4 oz (106.3 kg)    SpO2 97%    BMI 33.63 kg/m  Physical Exam Constitutional:      Appearance: Normal appearance.  HENT:     Head: Normocephalic and atraumatic.  Cardiovascular:     Rate and Rhythm: Normal rate and regular rhythm.  Pulmonary:  Effort: Pulmonary effort is normal.     Breath sounds: Normal breath sounds. No wheezing, rhonchi or rales.  Musculoskeletal:        General: Normal range of motion.  Skin:    General: Skin is warm and dry.  Neurological:     General: No focal deficit present.     Mental Status: He is alert and oriented to person, place, and time. Mental status is at baseline.  Psychiatric:        Mood and Affect: Mood normal.        Behavior: Behavior normal.        Thought Content: Thought content normal.        Judgment: Judgment normal.     Lab Results:  CBC    Component Value Date/Time   WBC 4.7 05/29/2021 0359   RBC 4.05 (L) 05/29/2021 0359   HGB 10.2 (L) 05/29/2021 0359   HGB 12.1 (L) 06/07/2020 1119   HGB 15.5 12/25/2018 1306   HCT 33.1 (L) 05/29/2021 0359   HCT 45.7 12/25/2018 1306   PLT 148 (L) 05/29/2021 0359   PLT 138 (L) 06/07/2020 1119   PLT 142 (L) 12/25/2018 1306   MCV 81.7 05/29/2021 0359   MCV 92 12/25/2018 1306   MCH 25.2 (L) 05/29/2021 0359   MCHC 30.8 05/29/2021 0359   RDW 19.9 (H) 05/29/2021 0359   RDW 14.4 12/25/2018 1306   LYMPHSABS 0.4 (L) 05/29/2021 0054   MONOABS 1.0 05/29/2021 0054   EOSABS 0.1 05/29/2021 0054   BASOSABS 0.0 05/29/2021 0054    BMET    Component Value Date/Time   NA 136 05/29/2021 0359   NA 138 12/25/2018 1306   K 3.7 05/29/2021 0359   CL 103 05/29/2021 0359   CO2 27  05/29/2021 0359   GLUCOSE 112 (H) 05/29/2021 0359   BUN 15 05/29/2021 0359   BUN 15 12/25/2018 1306   CREATININE 0.78 05/29/2021 0359   CREATININE 0.83 06/07/2020 1119   CREATININE 1.02 05/16/2016 0937   CALCIUM 8.5 (L) 05/29/2021 0359   GFRNONAA >60 05/29/2021 0359   GFRNONAA >60 06/07/2020 1119   GFRAA >60 03/10/2020 0855    BNP    Component Value Date/Time   BNP 79.4 01/22/2021 1634   BNP 43.4 05/16/2016 0937    ProBNP    Component Value Date/Time   PROBNP 60.0 08/10/2014 1052    Imaging: DG Chest 2 View  Result Date: 06/08/2021 CLINICAL DATA:  Left-sided pleurisy EXAM: CHEST - 2 VIEW COMPARISON:  05/28/2021 FINDINGS: Chronic right pleuroparenchymal disease. Prior left lobectomy. Left lung is clear. No pneumothorax. Calcified bilateral hilar lymph nodes. Mild bilateral interstitial prominence. Stable cardiomegaly. Right-sided Port-A-Cath in satisfactory unchanged position. No acute osseous abnormality. IMPRESSION: No acute cardiopulmonary disease. Chronic right pleuroparenchymal disease unchanged from the prior exam. Cardiomegaly with mild pulmonary vascular congestion. Electronically Signed   By: Kathreen Devoid M.D.   On: 06/08/2021 08:31   DG Ribs Unilateral W/Chest Left  Result Date: 05/28/2021 CLINICAL DATA:  Fall today.  Complains of anterior left rib pain. EXAM: LEFT RIBS AND CHEST - 3+ VIEW COMPARISON:  Chest radiograph 04/04/2021; CT chest 01/22/2021 FINDINGS: No fracture or other bone lesions are seen involving the ribs. There is no evidence of pneumothorax or pleural effusion. Surgical staple seen in the right mid and lower lung. Chronic small right pleural effusion. The lungs are otherwise clear. Multiple calcified mediastinal lymph nodes, stable. Stable cardiomediastinal silhouette. Aortic calcifications. Power injectable right IJ central venous catheter tip  projects at the level of the mid SVC. IMPRESSION: 1. No rib fracture. 2. Chronic small right pleural effusion.  No new focal consolidation. 3.  Aortic Atherosclerosis (ICD10-I70.0). Electronically Signed   By: Ileana Roup M.D.   On: 05/28/2021 19:36   CT Head Wo Contrast  Result Date: 05/28/2021 CLINICAL DATA:  Recent trip and fall with headaches and neck pain, initial encounter EXAM: CT HEAD WITHOUT CONTRAST CT MAXILLOFACIAL WITHOUT CONTRAST CT CERVICAL SPINE WITHOUT CONTRAST TECHNIQUE: Multidetector CT imaging of the head, cervical spine, and maxillofacial structures were performed using the standard protocol without intravenous contrast. Multiplanar CT image reconstructions of the cervical spine and maxillofacial structures were also generated. COMPARISON:  None. FINDINGS: CT HEAD FINDINGS Brain: No evidence of acute infarction, hemorrhage, hydrocephalus, extra-axial collection or mass lesion/mass effect. Chronic atrophic and ischemic changes are noted. Vascular: No hyperdense vessel or unexpected calcification. Skull: Normal. Negative for fracture or focal lesion. Other: Scalp swelling is noted in the left supraorbital region laterally. CT MAXILLOFACIAL FINDINGS Osseous: No acute bony abnormality is noted. Orbits: Orbits and their contents are within normal limits. Sinuses: Paranasal sinuses are clear. Soft tissues: Left periorbital region laterally and extending superiorly consistent with the recent injury. No other soft tissue abnormality is noted. CT CERVICAL SPINE FINDINGS Alignment: Within normal limits. Skull base and vertebrae: 7 cervical segments are well visualized. 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 Facet hypertrophic changes are noted. Varied levels of fusion are noted from C6-T2. Posterior fusion defect is noted at C4. No acute fracture or acute facet abnormality is noted. Soft tissues and spinal canal: Right chest wall port is noted. Surrounding soft tissue structures demonstrate vascular calcifications. No hematoma  or mass lesion is seen. Upper chest: Visualized lung apices demonstrate scarring in the right apex similar to that seen on prior CT from August of 2022. Other: None IMPRESSION: CT of the head: No acute intracranial abnormality noted. CT of the maxillofacial bones: No acute fracture is seen. Soft tissue swelling in the left periorbital region and left scalp is seen. 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 Electronically Signed   By: Inez Catalina M.D.   On: 05/28/2021 19:51   CT Chest W Contrast  Result Date: 05/28/2021 CLINICAL DATA:  Trauma. Fall. History of metastatic colorectal cancer. EXAM: CT CHEST WITH CONTRAST TECHNIQUE: Multidetector CT imaging of the chest was performed during intravenous contrast administration. CONTRAST:  37mL OMNIPAQUE IOHEXOL 350 MG/ML SOLN COMPARISON:  CT chest 01/22/2021. FINDINGS: Cardiovascular: Heart is mildly enlarged. There is no pericardial effusion. Aorta is normal in size. There are atherosclerotic calcifications of the aorta and coronary arteries. Central venous catheter tip ends at the cavoatrial junction. Mediastinum/Nodes: Calcified nodule in the right thyroid gland measures 6 mm and is unchanged. There are diffuse calcified mediastinal and hilar lymph nodes similar to the prior study. Esophagus is nondilated. There is stranding and heterogeneous hyperdensity in the anterior left mediastinum at the level of the heart concerning for mediastinal hematoma. No pneumomediastinum. Lungs/Pleura: There is a small to moderate-sized right pleural effusion which is likely partially loculated, similar to the prior examination. No pneumothorax. Right upper lobe parenchymal opacities and retraction appear similar to the prior examination. Surgical staple line in the right upper hemithorax is unchanged. Linear areas of scarring in the left upper lobe are similar to the prior  study. Tree-in-bud opacities in the  inferior left upper lobe are also similar from the prior examination. There are bilateral calcified nodules, unchanged. Trachea and central airways are patent. Musculoskeletal: No chest wall abnormality. No acute or significant osseous findings. IMPRESSION: 1. Small amount of anterior mediastinal hematoma at the level of the heart. No acute fractures are seen. 2. Stable loculated right pleural effusion. Stable chronic changes in both lungs. No new focal lung infiltrate. Electronically Signed   By: Ronney Asters M.D.   On: 05/28/2021 22:40   CT Cervical Spine Wo Contrast  Result Date: 05/28/2021 CLINICAL DATA:  Recent trip and fall with headaches and neck pain, initial encounter EXAM: CT HEAD WITHOUT CONTRAST CT MAXILLOFACIAL WITHOUT CONTRAST CT CERVICAL SPINE WITHOUT CONTRAST TECHNIQUE: Multidetector CT imaging of the head, cervical spine, and maxillofacial structures were performed using the standard protocol without intravenous contrast. Multiplanar CT image reconstructions of the cervical spine and maxillofacial structures were also generated. COMPARISON:  None. FINDINGS: CT HEAD FINDINGS Brain: No evidence of acute infarction, hemorrhage, hydrocephalus, extra-axial collection or mass lesion/mass effect. Chronic atrophic and ischemic changes are noted. Vascular: No hyperdense vessel or unexpected calcification. Skull: Normal. Negative for fracture or focal lesion. Other: Scalp swelling is noted in the left supraorbital region laterally. CT MAXILLOFACIAL FINDINGS Osseous: No acute bony abnormality is noted. Orbits: Orbits and their contents are within normal limits. Sinuses: Paranasal sinuses are clear. Soft tissues: Left periorbital region laterally and extending superiorly consistent with the recent injury. No other soft tissue abnormality is noted. CT CERVICAL SPINE FINDINGS Alignment: Within normal limits. Skull base and vertebrae: 7 cervical segments are well visualized. There is fusion of C3 and C4  which appears congenital in nature with fusion of the posterior elements as well on the left. Facet hypertrophic changes are noted. Varied levels of fusion are noted from C6-T2. Posterior fusion defect is noted at C4. No acute fracture or acute facet abnormality is noted. Soft tissues and spinal canal: Right chest wall port is noted. Surrounding soft tissue structures demonstrate vascular calcifications. No hematoma or mass lesion is seen. Upper chest: Visualized lung apices demonstrate scarring in the right apex similar to that seen on prior CT from August of 2022. Other: None IMPRESSION: CT of the head: No acute intracranial abnormality noted. CT of the maxillofacial bones: No acute fracture is seen. Soft tissue swelling in the left periorbital region and left scalp is seen. 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 Electronically Signed   By: Inez Catalina M.D.   On: 05/28/2021 19:51   CT ABDOMEN PELVIS W CONTRAST  Result Date: 05/28/2021 CLINICAL DATA:  Blunt abdominal trauma, left flank pain. Metastatic colorectal cancer. EXAM: CT ABDOMEN AND PELVIS WITH CONTRAST TECHNIQUE: Multidetector CT imaging of the abdomen and pelvis was performed using the standard protocol following bolus administration of intravenous contrast. CONTRAST:  41mL OMNIPAQUE IOHEXOL 350 MG/ML SOLN COMPARISON:  04/26/2020, 06/30/2020, CT chest 01/22/2021 FINDINGS: Lower chest: Moderate right pleural effusion is present, unchanged from prior examination. Right basilar wedge resection x2 has been performed with stable resultant right-sided volume loss. Scarring within the right upper lobe is unchanged. Numerous subcentimeter nodules within the superior segment of the a right lower lobe appear grossly unchanged from prior examination. Extensive coronary artery stenting has been performed. Global cardiac size within normal limits. No pericardial effusion. There is  infiltration of the pericardial fat at the left lung base most in  keeping with interstitial hemorrhage without significant mass effect upon the cardiac structures. No mass forming hematoma identified. Hepatobiliary: Metastatic lesion within the right hepatic lobe appears smaller than on prior pre ablation imaging of 06/30/2020, now measuring 2.5 x 3.7 cm at axial image # 57/2. Fiducial markers and post ablated changes are noted involving the inferior right hepatic lobe. Lesion within the subserosal left hepatic lobe appears smaller, measuring 1.0 x 2.8 cm at axial image # 50/2 with retraction of the hepatic capsule. Cholecystectomy has been performed. Pancreas: Unremarkable Spleen: Unremarkable Adrenals/Urinary Tract: Adrenal glands are unremarkable. Kidneys are normal, without renal calculi, focal lesion, or hydronephrosis. Bladder is unremarkable. Stomach/Bowel: Severe descending and sigmoid colonic diverticulosis without superimposed acute inflammatory change. Right hemicolectomy has been performed. No evidence of obstruction or focal inflammation. Stomach, small bowel, and relate to ol rib residual large bowel are unremarkable. No free intraperitoneal gas or fluid. Implanted intraperitoneal port catheter is seen with the catheter tip within the periportal region. Vascular/Lymphatic: Aortic atherosclerosis. No enlarged abdominal or pelvic lymph nodes. Reproductive: Brachytherapy seeds are seen within the prostate gland. Other: Tiny fat containing bilateral inguinal hernias are present. Musculoskeletal: No acute bone abnormality. IMPRESSION: Infiltration of the pericardial fat at the left lung base most in keeping with interstitial hemorrhage related to acute trauma. No mass forming hematoma identified. No significant mass effect upon the cardiac structures. No definite fracture is clearly identified. No acute intra-abdominal pathology identified. Interval response to therapy with post ablated changes involving  the inferior right hepatic lesion and interval decrease in size within the superior right and left hepatic metastatic masses. Distal colonic diverticulosis without acute inflammatory change. Aortic Atherosclerosis (ICD10-I70.0). Electronically Signed   By: Fidela Salisbury M.D.   On: 05/28/2021 22:50   ECHOCARDIOGRAM COMPLETE  Result Date: 05/29/2021    ECHOCARDIOGRAM REPORT   Patient Name:   TYSIN SALADA Date of Exam: 05/29/2021 Medical Rec #:  [id number]       Height:       70.0 in Accession #:    [id number]      Weight:       225.0 lb Date of Birth:  1941-04-06      BSA:          2.194 m Patient Age:    34 years        BP:           155/74 mmHg Patient Gender: M               HR:           75 bpm. Exam Location:  Inpatient Procedure: 2D Echo, Color Doppler and Cardiac Doppler Indications:    Trauma (see sonographer comments)  History:        Patient has prior history of Echocardiogram examinations, most                 recent 05/30/2016. CAD; Risk Factors:Hypertension and                 Dyslipidemia.  Sonographer:    Raquel Sarna Senior RDCS Referring Phys: [place]  1. Left ventricular ejection fraction, by estimation, is 60 to 65%. The left ventricle has normal function. The left ventricle has no regional wall motion abnormalities. There is mild concentric left ventricular hypertrophy. Left ventricular diastolic parameters are consistent with Grade I diastolic dysfunction (impaired relaxation).  2. Right ventricular systolic function is normal. The right ventricular size is normal.  3. The mitral  valve is normal in structure. No evidence of mitral valve regurgitation. No evidence of mitral stenosis.  4. The aortic valve is tricuspid. There is mild calcification of the aortic valve. There is mild thickening of the aortic valve. Aortic valve regurgitation is not visualized. Aortic valve sclerosis is present, with no evidence of aortic valve stenosis.  5. There is mild dilatation of the  ascending aorta, measuring 40 mm.  6. The inferior vena cava is normal in size with greater than 50% respiratory variability, suggesting right atrial pressure of 3 mmHg. Comparison(s): No significant change from prior study. Prior images reviewed side by side. FINDINGS  Left Ventricle: Left ventricular ejection fraction, by estimation, is 60 to 65%. The left ventricle has normal function. The left ventricle has no regional wall motion abnormalities. The left ventricular internal cavity size was normal in size. There is  mild concentric left ventricular hypertrophy. Left ventricular diastolic parameters are consistent with Grade I diastolic dysfunction (impaired relaxation). Indeterminate filling pressures. Right Ventricle: The right ventricular size is normal. No increase in right ventricular wall thickness. Right ventricular systolic function is normal. Left Atrium: Left atrial size was normal in size. Right Atrium: Right atrial size was normal in size. Pericardium: There is no evidence of pericardial effusion. Presence of epicardial fat layer. Mitral Valve: The mitral valve is normal in structure. No evidence of mitral valve regurgitation. No evidence of mitral valve stenosis. Tricuspid Valve: The tricuspid valve is normal in structure. Tricuspid valve regurgitation is not demonstrated. No evidence of tricuspid stenosis. Aortic Valve: The aortic valve is tricuspid. There is mild calcification of the aortic valve. There is mild thickening of the aortic valve. Aortic valve regurgitation is not visualized. Aortic valve sclerosis is present, with no evidence of aortic valve stenosis. Pulmonic Valve: The pulmonic valve was normal in structure. Pulmonic valve regurgitation is not visualized. No evidence of pulmonic stenosis. Aorta: The aortic root is normal in size and structure. There is mild dilatation of the ascending aorta, measuring 40 mm. Venous: The inferior vena cava is normal in size with greater than 50%  respiratory variability, suggesting right atrial pressure of 3 mmHg. IAS/Shunts: No atrial level shunt detected by color flow Doppler.  LEFT VENTRICLE PLAX 2D LVIDd:         4.00 cm   Diastology LVIDs:         2.70 cm   LV e' medial:    5.98 cm/s LV PW:         1.40 cm   LV E/e' medial:  12.3 LV IVS:        1.20 cm   LV e' lateral:   6.96 cm/s LVOT diam:     2.10 cm   LV E/e' lateral: 10.6 LV SV:         93 LV SV Index:   42 LVOT Area:     3.46 cm  RIGHT VENTRICLE RV S prime:     12.10 cm/s TAPSE (M-mode): 2.0 cm LEFT ATRIUM             Index        RIGHT ATRIUM           Index LA diam:        3.20 cm 1.46 cm/m   RA Area:     14.10 cm LA Vol (A2C):   54.1 ml 24.65 ml/m  RA Volume:   30.40 ml  13.85 ml/m LA Vol (A4C):   66.7 ml 30.39 ml/m LA Biplane  Vol: 62.6 ml 28.53 ml/m  AORTIC VALVE LVOT Vmax:   126.00 cm/s LVOT Vmean:  88.300 cm/s LVOT VTI:    0.268 m  AORTA Ao Root diam: 3.70 cm Ao Asc diam:  4.00 cm MITRAL VALVE MV Area (PHT): 2.42 cm     SHUNTS MV Decel Time: 314 msec     Systemic VTI:  0.27 m MV E velocity: 73.70 cm/s   Systemic Diam: 2.10 cm MV A velocity: 109.00 cm/s MV E/A ratio:  0.68 Mihai Croitoru MD Electronically signed by Sanda Klein MD Signature Date/Time: 05/29/2021/2:14:30 PM    Final    CT Maxillofacial Wo Contrast  Result Date: 05/28/2021 CLINICAL DATA:  Recent trip and fall with headaches and neck pain, initial encounter EXAM: CT HEAD WITHOUT CONTRAST CT MAXILLOFACIAL WITHOUT CONTRAST CT CERVICAL SPINE WITHOUT CONTRAST TECHNIQUE: Multidetector CT imaging of the head, cervical spine, and maxillofacial structures were performed using the standard protocol without intravenous contrast. Multiplanar CT image reconstructions of the cervical spine and maxillofacial structures were also generated. COMPARISON:  None. FINDINGS: CT HEAD FINDINGS Brain: No evidence of acute infarction, hemorrhage, hydrocephalus, extra-axial collection or mass lesion/mass effect. Chronic atrophic and  ischemic changes are noted. Vascular: No hyperdense vessel or unexpected calcification. Skull: Normal. Negative for fracture or focal lesion. Other: Scalp swelling is noted in the left supraorbital region laterally. CT MAXILLOFACIAL FINDINGS Osseous: No acute bony abnormality is noted. Orbits: Orbits and their contents are within normal limits. Sinuses: Paranasal sinuses are clear. Soft tissues: Left periorbital region laterally and extending superiorly consistent with the recent injury. No other soft tissue abnormality is noted. CT CERVICAL SPINE FINDINGS Alignment: Within normal limits. Skull base and vertebrae: 7 cervical segments are well visualized. 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 Facet hypertrophic changes are noted. Varied levels of fusion are noted from C6-T2. Posterior fusion defect is noted at C4. No acute fracture or acute facet abnormality is noted. Soft tissues and spinal canal: Right chest wall port is noted. Surrounding soft tissue structures demonstrate vascular calcifications. No hematoma or mass lesion is seen. Upper chest: Visualized lung apices demonstrate scarring in the right apex similar to that seen on prior CT from August of 2022. Other: None IMPRESSION: CT of the head: No acute intracranial abnormality noted. CT of the maxillofacial bones: No acute fracture is seen. Soft tissue swelling in the left periorbital region and left scalp is seen. 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 Electronically Signed   By: Inez Catalina M.D.   On: 05/28/2021 19:51     Assessment & Plan:   Asthmatic bronchitis with acute exacerbation - Patient developed productive cough 10 days ago with associated PND. He has chronic dyspnea symptoms. Not currently using BREO d.t lack of perceived benefit, however, he does report responding to prn albuterol and  oral prednisone. Lungs were clear on exam and VSS. Sending in Rx doxycycline 100mg  BID x 7 days and prednisone 20mg  x 5 days. Recommend he continue Robitussin q 4 hours prn cough. Start flonase nasal spray 1 spray per nostril daily. If cough does not improve or resolve would recommend resuming a different ICS/LABA.    Martyn Ehrich, NP 06/27/2021

## 2021-07-04 ENCOUNTER — Ambulatory Visit: Payer: Medicare PPO | Admitting: Interventional Cardiology

## 2021-07-12 ENCOUNTER — Ambulatory Visit: Payer: Medicare PPO | Admitting: Pulmonary Disease

## 2021-07-24 ENCOUNTER — Encounter: Payer: Self-pay | Admitting: Pulmonary Disease

## 2021-07-25 MED ORDER — ALBUTEROL SULFATE HFA 108 (90 BASE) MCG/ACT IN AERS
1.0000 | INHALATION_SPRAY | Freq: Four times a day (QID) | RESPIRATORY_TRACT | 1 refills | Status: DC | PRN
Start: 2021-07-25 — End: 2022-05-08

## 2021-08-01 ENCOUNTER — Encounter: Payer: Self-pay | Admitting: Interventional Cardiology

## 2021-08-01 ENCOUNTER — Ambulatory Visit: Payer: Medicare PPO | Admitting: Interventional Cardiology

## 2021-08-01 ENCOUNTER — Other Ambulatory Visit: Payer: Self-pay

## 2021-08-01 VITALS — BP 118/78 | HR 83 | Ht 70.0 in | Wt 239.0 lb

## 2021-08-01 DIAGNOSIS — I10 Essential (primary) hypertension: Secondary | ICD-10-CM

## 2021-08-01 DIAGNOSIS — I25118 Atherosclerotic heart disease of native coronary artery with other forms of angina pectoris: Secondary | ICD-10-CM | POA: Diagnosis not present

## 2021-08-01 DIAGNOSIS — E782 Mixed hyperlipidemia: Secondary | ICD-10-CM

## 2021-08-01 DIAGNOSIS — R0609 Other forms of dyspnea: Secondary | ICD-10-CM

## 2021-08-01 DIAGNOSIS — I6521 Occlusion and stenosis of right carotid artery: Secondary | ICD-10-CM

## 2021-08-01 NOTE — Patient Instructions (Signed)
Medication Instructions:  Your physician recommends that you continue on your current medications as directed. Please refer to the Current Medication list given to you today.  *If you need a refill on your cardiac medications before your next appointment, please call your pharmacy*   Lab Work: Lab work to be done today--CBC, BNP, BMP If you have labs (blood work) drawn today and your tests are completely normal, you will receive your results only by: [place] (if you have MyChart) OR A paper copy in the mail If you have any lab test that is abnormal or we need to change your treatment, we will call you to review the results.   Testing/Procedures: none   Follow-Up: At [facility], you and your health needs are our priority.  As part of our continuing mission to provide you with exceptional heart care, we have created designated Provider Care Teams.  These Care Teams include your primary Cardiologist (physician) and Advanced Practice Providers (APPs -  Physician Assistants and Nurse Practitioners) who all work together to provide you with the care you need, when you need it.  We recommend signing up for the patient portal called "MyChart".  Sign up information is provided on this After Visit Summary.  MyChart is used to connect with patients for Virtual Visits (Telemedicine).  Patients are able to view lab/test results, encounter notes, upcoming appointments, etc.  Non-urgent messages can be sent to your provider as well.   To learn more about what you can do with MyChart, go to [email or web address].    Your next appointment:   6 month(s)  The format for your next appointment:   In Person  Provider:   Larae Grooms, MD     Other Instructions

## 2021-08-01 NOTE — Progress Notes (Signed)
Cardiology Office Note   Date:  08/01/2021   ID:  Luis [name], DOB 09-05-1940, MRN [id number]  PCP:  [name], MD    Chief Complaint  Patient presents with   Follow-up   CAD  Wt Readings from Last 3 Encounters:  08/01/21 239 lb (108.4 kg)  06/27/21 234 lb 6.4 oz (106.3 kg)  06/07/21 234 lb 9.6 oz (106.4 kg)       History of Present Illness: Luis [name] is a 81 y.o. male   with history of CAD status post DES to the diagonal 08/2014 ramus intermedius was totally occluded at the time, eventually underwent CTO PCI with overlapping DES x2 to the ramus 02/2015.  Chest pain 05/2016 relieved with nitroglycerin and treated with Ranexa but has been off.  NST 12/2017 normal LVEF normal study.  Found to have a totally occluded right carotid on Dopplers 04/2018 but other vessels open. HTN, HLD, Also has adenocarcinoma of the colon status post colectomy.   Patient added to office schedule on 12/25/18 for increased dyspnea on exertion and left arm pain relieved with rest. Was walking 3 miles daily but had to cut back to 1 1/2 miles daily because of shortness of breath.  No chest tightness. Similar to when he had his stents placed. Didn't have chest tightness then. Had gained 10-12 lbs. Given his ongoing symptoms he was referred for outpatient cardiac cath.    Patient underwent cardiac cath 01/02/2019 with DES to the LAD and balloon angioplasty to the diagonal bifurcation.  Plan for aspirin and Plavix for at least 6 months.   He had liver biopsy.He has had some CP with eating certin foods and with exertion but improved with NTG and amlodipine.  Sx resolved.   He got chemotherapy for adenoCA of colon with liver Mets, then Surgery planned once tumor had shrunk.  He decided not to have surgery, so he was treated with chemotherapy and will have an ablation of the tumor on the liver.    Had a recent episode of chest pain with negative w/u in ER.    Pain was different from his prior  angina.  Troponin was negative.  ECG was unchanged.  He was seen by Dr. Martinique and sent home.  Plan was to consider repeat heart cath if symptoms persisted.    Walking was limited by spinal stenosis.  Breathing was better with Breo.    Hsa been treated for colon cancer with liver mets: "Procedure: 07/23/2020 Diagnostic laparoscopy, robot-assisted insertion of hepatic artery infusion pump (Intera 3000, 30 mL reservoir, flow rate 1.3 ml/day), portal lymphadenectomy, core tumor biopsy OPERATIVE FINDINGS: No extrahepatic disease. Bilobar liver metastases. Steatotic liver. Completely replaced common hepatic artery off SMA. Bilobar hepatic perfusion without extrahepatic perfusion when pump bolused with blue dye.  Procedure: 01/06/2021 Diagnostic laparoscopy, robot-assisted partial hepatectomy (segment 2, segment 6), microwave ablation (segment 7), intraoperative ultrasound OPERATIVE FINDINGS: No extrahepatic disease. Three tumors identified in the liver in segments 2, 6 and 7, consistent with preoperative imaging. Segment 2 and 6 tumors resected with grossly negative margins. Segment 6 resection bed with small bile leak, oversewn and clipped (drain left in place). Segment 7 tumor ablated under ultrasound and navigation guidance. Liver steatotic. Pathology: segment 2 and segment 6 resections c/w adenocarcinoma of colorectal primary, negative margins"   He had some more chest pain and went to ER with negative w/u in 01/2021.  Hbg was 9.6.  Noted at [facility] that he had to use  additional diuretics for pleural effusions and shortness of breath.  Chest x-ray on January 22, 2021 showed significant improvement in the right-sided pleural effusion.   In 2022:"He is increasing walking and had some DOE.  Even with small activities, he is feeling some shortness of breath.  Responds well to Lasix."  DOE persists.  Spinal stenosis as well causing some leg fatigue.   Denies : Chest pain. Dizziness. Leg edema. Nitroglycerin  use. Orthopnea. Palpitations. Paroxysmal nocturnal dyspnea.  Syncope.     Past Medical History:  Diagnosis Date   Adenocarcinoma of cecum ([place]) 06/21/2017   Arthritis    "mid back; hands; knees" (02/24/2015)   Basal cell carcinoma    left shoulder; mid chest; right eyelid (02/24/2015)   CAD (coronary artery disease)    a. 08/2014 Cath/PCI: LM nl, LAD 30p, D1 95 (2.25x12 Resolute Integrity DES), LCX small, RI 100 (attempted PCI) - branches fill via L->L collats, RCA dominant, nl, RPDA/PLA nl, EF 60^. b. 02/24/2015 PCI CTO of Ramus DES x2.   Chronic bronchitis ([place])    hx   Diverticulosis 05/2005   Elevated lipase    Fuchs' corneal dystrophy    History of adenomatous polyp of colon 05/2005   8 mm adenoma   History of blood transfusion    "related to some of my surgeries"   Hyperlipidemia    Hypertension    Iron deficiency anemia due to chronic blood loss 06/21/2017   Pneumonia    Prostate cancer ([place]) 2011   S/P seed implant   Sarcoidosis    Thrombocytopenia ([place])    a. Noted on prior labs, unclear of what w/u done.   Trifascicular block     Past Surgical History:  Procedure Laterality Date   BASAL CELL CARCINOMA EXCISION Left    shoulder   CARDIAC CATHETERIZATION N/A 02/24/2015   Procedure: Coronary/Bypass Graft CTO Intervention;  Surgeon: Jettie Booze, MD;  Location: [place] [facility];  Service: Cardiovascular;  Laterality: N/A;   CARDIAC CATHETERIZATION  02/24/2015   Procedure: Coronary/Graft Atherectomy;  Surgeon: Jettie Booze, MD;  Location: [place] [facility];  Service: Cardiovascular;;   CATARACT EXTRACTION W/ INTRAOCULAR LENS  IMPLANT, BILATERAL Bilateral ~ 2011-2012   COLONOSCOPY W/ POLYPECTOMY  06/08/2005 and 10/18/2010   8 mm adenoma 2006, none 2012. Diverticulosis and internal hemorrhoids.   CORNEAL TRANSPLANT Bilateral ~ 2011-2012   "@ same time as cataract OR"   CORONARY ANGIOPLASTY WITH STENT PLACEMENT  08/2014; 02/24/2015   "1 stent + 1 stent"    CORONARY STENT INTERVENTION N/A 01/02/2019   Procedure: CORONARY STENT INTERVENTION;  Surgeon: Jettie Booze, MD;  Location: [place] [facility];  Service: Cardiovascular;  Laterality: N/A;   EYE SURGERY     FINGER SURGERY Right 2014   "reattached middle finger"   INSERTION PROSTATE RADIATION SEED  ~ 2011   IR IMAGING GUIDED PORT INSERTION  03/04/2019   IR RADIOLOGIST EVAL & MGMT  05/26/2020   IR US GUIDE BX ASP/DRAIN  03/04/2019   LAPAROSCOPIC CHOLECYSTECTOMY  2015   LAPAROSCOPIC PARTIAL COLECTOMY N/A 07/26/2017   Procedure: LAPAROSCOPIC PARTIAL COLECTOMY, EXCISION SCROTAL CYST;  Surgeon: Greer Pickerel, MD;  Location: [facility];  Service: General;  Laterality: N/A;   LEFT HEART CATH AND CORONARY ANGIOGRAPHY N/A 01/02/2019   Procedure: LEFT HEART CATH AND CORONARY ANGIOGRAPHY;  Surgeon: Jettie Booze, MD;  Location: [place] [facility];  Service: Cardiovascular;  Laterality: N/A;   LEFT HEART CATHETERIZATION WITH CORONARY ANGIOGRAM N/A  08/12/2014   Procedure: LEFT HEART CATHETERIZATION WITH CORONARY ANGIOGRAM;  Surgeon: Blane Ohara, MD;  Location: [facility];  Service: Cardiovascular;  Laterality: N/A;   LYMPH NODE BIOPSY     mid chest   MOHS SURGERY Right ~ 2011   "eyelid; for basal cell"   RADIOLOGY WITH ANESTHESIA N/A 06/30/2020   Procedure: IR WITH ANESTHESIA MICROWAVE ABLATION;  Surgeon: Corrie Mckusick, DO;  Location: [facility];  Service: Anesthesiology;  Laterality: N/A;   THORACENTESIS N/A 02/11/2021   Procedure: Mathews Robinsons;  Surgeon: Lanier Clam, MD;  Location: [facility] ENDOS[facility];  Service: Pulmonary;  Laterality: N/A;   THORACENTESIS Right 04/04/2021   Procedure: THORACENTESIS;  Surgeon: Candee Furbish, MD;  Location: [facility] ENDOSCOPY;  Service: Pulmonary;  Laterality: Right;   THOROCOTOMY WITH LOBECTOMY Right ~ 1991   partial removal right lung   TONSILLECTOMY  ~ 1950     Current Outpatient Medications  Medication Sig Dispense Refill   acetaminophen (TYLENOL) 325 MG  tablet Take 650 mg by mouth every 6 (six) hours as needed for moderate pain.     albuterol (VENTOLIN HFA) 108 (90 Base) MCG/ACT inhaler Inhale 1-2 puffs into the lungs every 6 (six) hours as needed for wheezing or shortness of breath. 1 each 1   amLODipine (NORVASC) 5 MG tablet Take 1 tablet (5 mg total) by mouth daily. 90 tablet 3   Cholecalciferol (VITAMIN D3) 2000 units capsule Take 2,000 Units by mouth daily.     clobetasol (TEMOVATE) 0.05 % external solution Apply 1 application topically daily as needed (rash).     clopidogrel (PLAVIX) 75 MG tablet Take 75 mg by mouth daily.     gabapentin (NEURONTIN) 100 MG capsule Take 200 mg by mouth daily as needed (neuropathy).     ibuprofen (ADVIL) 100 MG tablet Take 100 mg by mouth every 6 (six) hours as needed for pain.     Multiple Vitamins-Minerals (VITRUM SENIOR) TABS Take 1 tablet by mouth daily.     naproxen sodium (ALEVE) 220 MG tablet Take 440 mg by mouth daily as needed (pain).     nitroGLYCERIN (NITROSTAT) 0.4 MG SL tablet PLACE 1 TABLET UNDER THE TONGUE EVERY 5 MINUTES AS NEEDED FOR CHEST PAIN FOR 3 DOSES (Patient taking differently: Place 0.4 mg under the tongue every 5 (five) minutes as needed.) 25 tablet 3   ranolazine (RANEXA) 500 MG 12 hr tablet Take 500 mg by mouth 2 (two) times daily.     rosuvastatin (CRESTOR) 20 MG tablet TAKE 1 TABLET BY MOUTH EVERY DAY 90 tablet 2   traMADol (ULTRAM) 50 MG tablet Take 2 tablets (100 mg total) by mouth every 6 (six) hours as needed. 10 tablet 0   furosemide (LASIX) 20 MG tablet Take 20 mg by mouth. (Patient not taking: Reported on 08/01/2021)     No current facility-administered medications for this visit.    Allergies:   Patient has no known allergies.    Social History:  The patient  reports that he has been smoking cigars and cigarettes. He has never used smokeless tobacco. He reports current alcohol use of about 2.0 standard drinks per week. He reports that he does not use drugs.   Family  History:  The patient's family history includes Coronary artery disease (age of onset: 70) in his sister; Heart attack in his sister; Heart disease (age of onset: 19) in his father.    ROS:  Please see the history of present illness.   Otherwise, review of  systems are positive for DOE.   All other systems are reviewed and negative.    PHYSICAL EXAM: VS:  BP 118/78    Pulse 83    Ht 5\' 10"  (1.778 m)    Wt 239 lb (108.4 kg)    SpO2 98%    BMI 34.29 kg/m  , BMI Body mass index is 34.29 kg/m. GEN: Well nourished, well developed, in no acute distress HEENT: normal Neck: no JVD, carotid bruits, or masses Cardiac: RRR; no murmurs, rubs, or gallops,no edema  Respiratory:  clear to auscultation bilaterally, normal work of breathing GI: soft, nontender, nondistended, + BS MS: no deformity or atrophy Skin: warm and dry, no rash Neuro:  Strength and sensation are intact Psych: euthymic mood, full affect   EKG:   The ekg ordered in Dec 2022 demonstrates NSR, RBBB   Recent Labs: 01/22/2021: B Natriuretic Peptide 79.4 05/29/2021: ALT 33; BUN 15; Creatinine, Ser 0.78; Hemoglobin 10.2; Platelets 148; Potassium 3.7; Sodium 136   Lipid Panel    Component Value Date/Time   CHOL 155 10/13/2015 1005   TRIG 186 (H) 10/13/2015 1005   HDL 49 10/13/2015 1005   CHOLHDL 3.2 10/13/2015 1005   VLDL 37 (H) 10/13/2015 1005   LDLCALC 69 10/13/2015 1005     Other studies Reviewed: Additional studies/ records that were reviewed today with results demonstrating: labs reviewed. Hgb 10.2 in 05/2021; 11.2 at [facility] a week earlier.    ASSESSMENT AND PLAN:  CAD: DOE that persists.  Continue clopidogrel monotherapy.  Awaiting colon cancer scan. DOE worse when his pleural effusion accumulates.  Stopped Lasix at home. Check CBC, BMet, BNP.  On CT scan, if there is pleural fluid, will plan for drainage.  In the past, his shortness of breath has responded very well to thoracentesis.  If no fluid, consider ischemic eval  , cath vs. Stress.  He prefers cath.  Last cath was in 2020. Hyperlipidemia: LDL 88.  Eats healthy.  Continue rosuvastatin.  PAD: Occluded right carotid artery.   HTN: The current medical regimen is effective;  continue present plan and medications.  Low salt diet.  LE edema: resolved.   If he is anemic, he will need management for the anemia.   Current medicines are reviewed at length with the patient today.  The patient concerns regarding his medicines were addressed.  The following changes have been made:  No change  Labs/ tests ordered today include:  No orders of the defined types were placed in this encounter.   Recommend 150 minutes/week of aerobic exercise Low fat, low carb, high fiber diet recommended  Disposition:   FU in 6 months, or sooner depending on plans for ischemic evaluation   Signed, Larae Grooms, MD  08/01/2021 8:38 AM    [facility] [place], [place], [place]  [address] Phone: [phone]; Fax: [phone]

## 2021-08-02 ENCOUNTER — Encounter: Payer: Self-pay | Admitting: Oncology

## 2021-08-02 LAB — CBC
Hematocrit: 40.6 % (ref 37.5–51.0)
Hemoglobin: 12.7 g/dL — ABNORMAL LOW (ref 13.0–17.7)
MCH: 26.3 pg — ABNORMAL LOW (ref 26.6–33.0)
MCHC: 31.3 g/dL — ABNORMAL LOW (ref 31.5–35.7)
MCV: 84 fL (ref 79–97)
Platelets: 171 10*3/uL (ref 150–450)
RBC: 4.83 x10E6/uL (ref 4.14–5.80)
RDW: 20.1 % — ABNORMAL HIGH (ref 11.6–15.4)
WBC: 4.5 10*3/uL (ref 3.4–10.8)

## 2021-08-02 LAB — BASIC METABOLIC PANEL
BUN/Creatinine Ratio: 14 (ref 10–24)
BUN: 12 mg/dL (ref 8–27)
CO2: 26 mmol/L (ref 20–29)
Calcium: 8.9 mg/dL (ref 8.6–10.2)
Chloride: 103 mmol/L (ref 96–106)
Creatinine, Ser: 0.88 mg/dL (ref 0.76–1.27)
Glucose: 105 mg/dL — ABNORMAL HIGH (ref 70–99)
Potassium: 4.5 mmol/L (ref 3.5–5.2)
Sodium: 141 mmol/L (ref 134–144)
eGFR: 87 mL/min/{1.73_m2} (ref >59)

## 2021-08-02 LAB — PRO B NATRIURETIC PEPTIDE: NT-Pro BNP: 69 pg/mL (ref 0–486)

## 2021-08-05 DIAGNOSIS — Z6833 Body mass index (BMI) 33.0-33.9, adult: Secondary | ICD-10-CM | POA: Diagnosis not present

## 2021-08-05 DIAGNOSIS — L700 Acne vulgaris: Secondary | ICD-10-CM | POA: Diagnosis not present

## 2021-08-05 DIAGNOSIS — D860 Sarcoidosis of lung: Secondary | ICD-10-CM | POA: Diagnosis not present

## 2021-08-05 DIAGNOSIS — J90 Pleural effusion, not elsewhere classified: Secondary | ICD-10-CM | POA: Diagnosis not present

## 2021-08-05 DIAGNOSIS — J8410 Pulmonary fibrosis, unspecified: Secondary | ICD-10-CM | POA: Diagnosis not present

## 2021-08-08 ENCOUNTER — Ambulatory Visit: Payer: Medicare PPO | Admitting: Interventional Cardiology

## 2021-08-08 ENCOUNTER — Encounter: Payer: Self-pay | Admitting: Pulmonary Disease

## 2021-08-08 DIAGNOSIS — C787 Secondary malignant neoplasm of liver and intrahepatic bile duct: Secondary | ICD-10-CM | POA: Diagnosis not present

## 2021-08-08 DIAGNOSIS — Z79899 Other long term (current) drug therapy: Secondary | ICD-10-CM | POA: Diagnosis not present

## 2021-08-08 DIAGNOSIS — J90 Pleural effusion, not elsewhere classified: Secondary | ICD-10-CM | POA: Diagnosis not present

## 2021-08-08 DIAGNOSIS — C189 Malignant neoplasm of colon, unspecified: Secondary | ICD-10-CM | POA: Diagnosis not present

## 2021-08-09 DIAGNOSIS — L300 Nummular dermatitis: Secondary | ICD-10-CM | POA: Diagnosis not present

## 2021-08-09 DIAGNOSIS — L821 Other seborrheic keratosis: Secondary | ICD-10-CM | POA: Diagnosis not present

## 2021-08-09 DIAGNOSIS — L820 Inflamed seborrheic keratosis: Secondary | ICD-10-CM | POA: Diagnosis not present

## 2021-08-09 DIAGNOSIS — C44311 Basal cell carcinoma of skin of nose: Secondary | ICD-10-CM | POA: Diagnosis not present

## 2021-08-09 NOTE — Telephone Encounter (Signed)
Dr. Silas Flood, please advise on pt's email regarding his pleural effusion and ShOB. Thanks.

## 2021-08-09 NOTE — Telephone Encounter (Signed)
Can we schedule a thoracentesis 08/16/2021 in afternoon? Needs to stop Plavix Thursday (last dose tomorrow). Schedule under Ina Homes, MD. I am working nights and am not available, unfortunately. Thanks!

## 2021-08-15 ENCOUNTER — Other Ambulatory Visit: Payer: Self-pay | Admitting: Interventional Cardiology

## 2021-08-15 NOTE — Telephone Encounter (Signed)
Can we get the thoracentesis scheduled ASAP. No later than Thursday. Schedule under Ina Homes, MD. I sent a message 2/28. Thanks.

## 2021-08-15 NOTE — Telephone Encounter (Signed)
Please advise on the procedure?  ?

## 2021-08-15 NOTE — Telephone Encounter (Signed)
Called Endo and there was no answer and no option to leave msg. Will call back in the am.  ?

## 2021-08-16 ENCOUNTER — Encounter (HOSPITAL_COMMUNITY)
Admission: RE | Disposition: A | Discharge status: Home / Self Care | Payer: Self-pay | Source: Home / Self Care | Attending: Internal Medicine

## 2021-08-16 ENCOUNTER — Other Ambulatory Visit: Payer: Self-pay

## 2021-08-16 ENCOUNTER — Ambulatory Visit (HOSPITAL_COMMUNITY)
Admission: RE | Admit: 2021-08-16 | Discharge: 2021-08-16 | Disposition: A | Discharge status: Home / Self Care | Payer: Medicare PPO | Attending: Internal Medicine | Admitting: Internal Medicine

## 2021-08-16 ENCOUNTER — Ambulatory Visit (HOSPITAL_COMMUNITY): Payer: Medicare PPO

## 2021-08-16 DIAGNOSIS — J90 Pleural effusion, not elsewhere classified: Secondary | ICD-10-CM | POA: Diagnosis not present

## 2021-08-16 DIAGNOSIS — F17290 Nicotine dependence, other tobacco product, uncomplicated: Secondary | ICD-10-CM | POA: Insufficient documentation

## 2021-08-16 DIAGNOSIS — Z9889 Other specified postprocedural states: Secondary | ICD-10-CM | POA: Diagnosis not present

## 2021-08-16 DIAGNOSIS — I2510 Atherosclerotic heart disease of native coronary artery without angina pectoris: Secondary | ICD-10-CM | POA: Diagnosis not present

## 2021-08-16 DIAGNOSIS — C787 Secondary malignant neoplasm of liver and intrahepatic bile duct: Secondary | ICD-10-CM | POA: Insufficient documentation

## 2021-08-16 DIAGNOSIS — Z79899 Other long term (current) drug therapy: Secondary | ICD-10-CM | POA: Diagnosis not present

## 2021-08-16 DIAGNOSIS — I10 Essential (primary) hypertension: Secondary | ICD-10-CM | POA: Diagnosis not present

## 2021-08-16 DIAGNOSIS — I6529 Occlusion and stenosis of unspecified carotid artery: Secondary | ICD-10-CM | POA: Insufficient documentation

## 2021-08-16 DIAGNOSIS — C180 Malignant neoplasm of cecum: Secondary | ICD-10-CM | POA: Diagnosis not present

## 2021-08-16 DIAGNOSIS — F17210 Nicotine dependence, cigarettes, uncomplicated: Secondary | ICD-10-CM | POA: Diagnosis not present

## 2021-08-16 HISTORY — PX: THORACENTESIS: SHX235

## 2021-08-16 SURGERY — THORACENTESIS
Anesthesia: LOCAL | Laterality: Right

## 2021-08-16 NOTE — Op Note (Signed)
Thoracentesis  Procedure Note ? ?DASTAN KRIDER  ?[id number]  ?06-Jun-1941 ? ?Date:08/16/21  ?Time:3:11 PM  ? ?Provider Performing:Yadhira Mckneely  ? ?Procedure: Thoracentesis with imaging guidance (20355) ? ?Indication(s) ?Pleural Effusion ? ?Consent ?Risks of the procedure as well as the alternatives and risks of each were explained to the patient and/or caregiver.  Consent for the procedure was obtained and is signed in the bedside chart ? ?Anesthesia ?Topical only with 1% lidocaine  ? ? ?Time Out ?Verified patient identification, verified procedure, site/side was marked, verified correct patient position, special equipment/implants available, medications/allergies/relevant history reviewed, required imaging and test results available. ? ? ?Sterile Technique ?Maximal sterile technique including full sterile barrier drape, hand hygiene, sterile gown, sterile gloves, mask, hair covering, sterile ultrasound probe cover (if used). ? ?Procedure Description ?Ultrasound was used to identify appropriate pleural anatomy for placement and overlying skin marked.  Area of drainage cleaned and draped in sterile fashion. Lidocaine was used to anesthetize the skin and subcutaneous tissue.  1450 cc's of Yellowish appearing fluid was drained from the right pleural space. Catheter then removed and bandaid applied to site. ? ? ?Complications/Tolerance ?None; patient tolerated the procedure well. ?Chest X-ray is ordered to confirm no post-procedural complication. ? ? ?EBL ?Minimal ? ? ?Specimen(s) ?Pleural fluid ? ? ? ? ? ? ? ? ? ? ?

## 2021-08-16 NOTE — H&P (Signed)
NAME:  Luis [name], MRN:  [id number], DOB:  [birth year], LOS: 0 ADMISSION DATE:  08/16/2021, CONSULTATION DATE:  08/16/2021 REFERRING MD:  Dr. Silas Flood, CHIEF COMPLAINT:  SOB   History of Present Illness:  81 year old male, current smoker. PMH significant for Sarcoidosis, presumed asthma, right sided pleural effusion, HTN, CAD, carotid stenosis, colon cancer with liver metastases. Patient of Dr. Silas Flood Presented today for thoracentesis due to large R sided pleural effusion  Pertinent  Medical History   Past Medical History:  Diagnosis Date   Adenocarcinoma of cecum ([place]) 06/21/2017   Arthritis    "mid back; hands; knees" (02/24/2015)   Basal cell carcinoma    left shoulder; mid chest; right eyelid (02/24/2015)   CAD (coronary artery disease)    a. 08/2014 Cath/PCI: LM nl, LAD 30p, D1 95 (2.25x12 Resolute Integrity DES), LCX small, RI 100 (attempted PCI) - branches fill via L->L collats, RCA dominant, nl, RPDA/PLA nl, EF 60^. b. 02/24/2015 PCI CTO of Ramus DES x2.   Chronic bronchitis ([place])    hx   Diverticulosis 05/2005   Elevated lipase    Fuchs' corneal dystrophy    History of adenomatous polyp of colon 05/2005   8 mm adenoma   History of blood transfusion    "related to some of my surgeries"   Hyperlipidemia    Hypertension    Iron deficiency anemia due to chronic blood loss 06/21/2017   Pneumonia    Prostate cancer ([place]) 2011   S/P seed implant   Sarcoidosis    Thrombocytopenia ([place])    a. Noted on prior labs, unclear of what w/u done.   Trifascicular block      Significant Hospital Events: Including procedures, antibiotic start and stop dates in addition to other pertinent events     Interim History / Subjective:    Objective   Blood pressure (!) 145/67, pulse 74, temperature 97.7 F (36.5 C), temperature source Temporal, resp. rate (!) 25, SpO2 100 %.       No intake or output data in the 24 hours ending 08/16/21 1501 There were no vitals filed for this  visit.  Examination: Physical exam: General: Elderly male, sitting on the bed HEENT: [place]/AT, eyes anicteric.  moist mucus membranes Neuro: Alert, awake following commands Chest: absent breath sounds on RLL, no wheezes or rhonchi Heart: Regular rate and rhythm, no murmurs or gallops Abdomen: Soft, nontender, nondistended, bowel sounds present Skin: No rash   Resolved Hospital Problem list     Assessment & Plan:  Recurrent L R sided pleural effusion Adenocarcinoma of cecum on chemotherapy  Will perform thoracentesis Informed consent obtained  Total of 1.44L of yellowish fluid was drained Patient started feeling better Will do state CXR  Best Practice (right click and "Reselect all SmartList Selections" daily)   Discharge home in stable condition  Labs   CBC: No results for input(s): WBC, NEUTROABS, HGB, HCT, MCV, PLT in the last 168 hours.  Basic Metabolic Panel: No results for input(s): NA, K, CL, CO2, GLUCOSE, BUN, CREATININE, CALCIUM, MG, PHOS in the last 168 hours. GFR: CrCl cannot be calculated (Unknown ideal weight.). No results for input(s): PROCALCITON, WBC, LATICACIDVEN in the last 168 hours.  Liver Function Tests: No results for input(s): AST, ALT, ALKPHOS, BILITOT, PROT, ALBUMIN in the last 168 hours. No results for input(s): LIPASE, AMYLASE in the last 168 hours. No results for input(s): AMMONIA in the last 168 hours.  ABG    Component Value Date/Time  TCO2 28 05/28/2021 2143     Coagulation Profile: No results for input(s): INR, PROTIME in the last 168 hours.  Cardiac Enzymes: No results for input(s): CKTOTAL, CKMB, CKMBINDEX, TROPONINI in the last 168 hours.  HbA1C: Hgb A1c MFr Bld  Date/Time Value Ref Range Status  07/23/2017 11:50 AM 5.2 4.8 - 5.6 % Final    Comment:    (NOTE) Pre diabetes:          5.7%-6.4% Diabetes:              >6.4% Glycemic control for   <7.0% adults with diabetes     CBG: No results for input(s): GLUCAP in  the last 168 hours.  Review of Systems:   12 point review of system is significant for complaints mentioned in HPI, rest per HPI   Past Medical History:  He,  has a past medical history of Adenocarcinoma of cecum ([place]) (06/21/2017), Arthritis, Basal cell carcinoma, CAD (coronary artery disease), Chronic bronchitis ([place]), Diverticulosis (05/2005), Elevated lipase, Fuchs' corneal dystrophy, History of adenomatous polyp of colon (05/2005), History of blood transfusion, Hyperlipidemia, Hypertension, Iron deficiency anemia due to chronic blood loss (06/21/2017), Pneumonia, Prostate cancer ([place]) (2011), Sarcoidosis, Thrombocytopenia ([place]), and Trifascicular block.   Surgical History:   Past Surgical History:  Procedure Laterality Date   BASAL CELL CARCINOMA EXCISION Left    shoulder   CARDIAC CATHETERIZATION N/A 02/24/2015   Procedure: Coronary/Bypass Graft CTO Intervention;  Surgeon: Jettie Booze, MD;  Location: [place] [facility];  Service: Cardiovascular;  Laterality: N/A;   CARDIAC CATHETERIZATION  02/24/2015   Procedure: Coronary/Graft Atherectomy;  Surgeon: Jettie Booze, MD;  Location: [place] [facility];  Service: Cardiovascular;;   CATARACT EXTRACTION W/ INTRAOCULAR LENS  IMPLANT, BILATERAL Bilateral ~ 2011-2012   COLONOSCOPY W/ POLYPECTOMY  06/08/2005 and 10/18/2010   8 mm adenoma 2006, none 2012. Diverticulosis and internal hemorrhoids.   CORNEAL TRANSPLANT Bilateral ~ 2011-2012   "@ same time as cataract OR"   CORONARY ANGIOPLASTY WITH STENT PLACEMENT  08/2014; 02/24/2015   "1 stent + 1 stent"   CORONARY STENT INTERVENTION N/A 01/02/2019   Procedure: CORONARY STENT INTERVENTION;  Surgeon: Jettie Booze, MD;  Location: [place] [facility];  Service: Cardiovascular;  Laterality: N/A;   EYE SURGERY     FINGER SURGERY Right 2014   "reattached middle finger"   INSERTION PROSTATE RADIATION SEED  ~ 2011   IR IMAGING GUIDED PORT INSERTION  03/04/2019   IR RADIOLOGIST EVAL &  MGMT  05/26/2020   IR US GUIDE BX ASP/DRAIN  03/04/2019   LAPAROSCOPIC CHOLECYSTECTOMY  2015   LAPAROSCOPIC PARTIAL COLECTOMY N/A 07/26/2017   Procedure: LAPAROSCOPIC PARTIAL COLECTOMY, EXCISION SCROTAL CYST;  Surgeon: Greer Pickerel, MD;  Location: [facility];  Service: General;  Laterality: N/A;   LEFT HEART CATH AND CORONARY ANGIOGRAPHY N/A 01/02/2019   Procedure: LEFT HEART CATH AND CORONARY ANGIOGRAPHY;  Surgeon: Jettie Booze, MD;  Location: [place] [facility];  Service: Cardiovascular;  Laterality: N/A;   LEFT HEART CATHETERIZATION WITH CORONARY ANGIOGRAM N/A 08/12/2014   Procedure: LEFT HEART CATHETERIZATION WITH CORONARY ANGIOGRAM;  Surgeon: Blane Ohara, MD;  Location: [facility] C[facility] LAB;  Service: Cardiovascular;  Laterality: N/A;   LYMPH NODE BIOPSY     mid chest   MOHS SURGERY Right ~ 2011   "eyelid; for basal cell"   RADIOLOGY WITH ANESTHESIA N/A 06/30/2020   Procedure: IR WITH ANESTHESIA MICROWAVE ABLATION;  Surgeon: Corrie Mckusick, DO;  Location: [facility];  Service: Anesthesiology;  Laterality: N/A;   THORACENTESIS N/A 02/11/2021   Procedure: Mathews Robinsons;  Surgeon: Lanier Clam, MD;  Location: [facility] ENDOSCOPY;  Service: Pulmonary;  Laterality: N/A;   THORACENTESIS Right 04/04/2021   Procedure: THORACENTESIS;  Surgeon: Candee Furbish, MD;  Location[facility] ENDOSCOPY;  Service: Pulmonary;  Laterality: Right;   THOROCOTOMY WITH LOBECTOMY Right ~ 1991   partial removal right lung   TONSILLECTOMY  ~ 1950     Social History:   reports that he has been smoking cigars and cigarettes. He has never used smokeless tobacco. He reports current alcohol use of about 2.0 standard drinks per week. He reports that he does not use drugs.   Family History:  His family history includes Coronary artery disease (age of onset: 3) in his sister; Heart attack in his sister; Heart disease (age of onset: 27) in his father. There is no history of Colon cancer, Throat cancer, Pancreatic cancer, Prostate  cancer, Stroke, or Hypertension.   Allergies No Known Allergies   Home Medications  Prior to Admission medications   Medication Sig Start Date End Date Taking? Authorizing Provider  acetaminophen (TYLENOL) 325 MG tablet Take 650 mg by mouth every 6 (six) hours as needed for moderate pain.   Yes [provider]  albuterol (VENTOLIN HFA) 108 (90 Base) MCG/ACT inhaler Inhale 1-2 puffs into the lungs every 6 (six) hours as needed for wheezing or shortness of breath. 07/25/21  Yes Hunsucker, Bonna Gains, MD  amLODipine (NORVASC) 5 MG tablet Take 1 tablet (5 mg total) by mouth daily. 06/23/21  Yes Jettie Booze, MD  Cholecalciferol (VITAMIN D3) 2000 units capsule Take 2,000 Units by mouth daily.   Yes [provider]  clobetasol (TEMOVATE) 0.05 % external solution Apply 1 application topically daily as needed (rash). 08/30/20 08/30/21 Yes [provider]  ibuprofen (ADVIL) 100 MG tablet Take 100 mg by mouth every 6 (six) hours as needed for pain.   Yes [provider]  Multiple Vitamins-Minerals (VITRUM SENIOR) TABS Take 1 tablet by mouth daily.   Yes [provider]  naproxen sodium (ALEVE) 220 MG tablet Take 440 mg by mouth daily as needed (pain).   Yes [provider]  ranolazine (RANEXA) 500 MG 12 hr tablet TAKE 1 TABLET BY MOUTH TWICE A DAY 08/16/21  Yes Jettie Booze, MD  rosuvastatin (CRESTOR) 20 MG tablet TAKE 1 TABLET BY MOUTH EVERY DAY 05/11/21  Yes Jettie Booze, MD  clopidogrel (PLAVIX) 75 MG tablet Take 75 mg by mouth daily.    [provider]  furosemide (LASIX) 20 MG tablet Take 20 mg by mouth. Patient not taking: Reported on 08/01/2021    [provider]  gabapentin (NEURONTIN) 100 MG capsule Take 200 mg by mouth daily as needed (neuropathy).    [provider]  nitroGLYCERIN (NITROSTAT) 0.4 MG SL tablet PLACE 1 TABLET UNDER THE TONGUE EVERY 5 MINUTES AS NEEDED FOR CHEST PAIN FOR 3  DOSES Patient taking differently: Place 0.4 mg under the tongue every 5 (five) minutes as needed. 01/21/21   Jettie Booze, MD  traMADol (ULTRAM) 50 MG tablet Take 2 tablets (100 mg total) by mouth every 6 (six) hours as needed. 05/29/21   Rolm Bookbinder, MD      Jacky Kindle MD [facility] See Amion for pager If no response to pager, please call [phone] until 7pm After 7pm, Please call E-link [phone]

## 2021-08-16 NOTE — Telephone Encounter (Signed)
Patient has been scheduled for [place] today at 2:30 pm. Was told to arrive at 2 pm. Will forward message to Dr. Silas Flood and Dr. Tamala Julian. Nothing further needed at this time.  ?

## 2021-08-18 ENCOUNTER — Encounter (HOSPITAL_COMMUNITY): Payer: Self-pay | Admitting: Internal Medicine

## 2021-08-31 ENCOUNTER — Telehealth: Payer: Self-pay | Admitting: *Deleted

## 2021-08-31 NOTE — Telephone Encounter (Signed)
Called patient to f/u on his status. He reports doing very well. Last chemo June 2022 and surgery in July being able to remove two of liver tumors. One had ablation followed by RT for suspicious activity at site on CT scan. Inquired if he is willing to see Dr. Benay Spice at some point and he agrees with this. Informed him we will not be able to start refilling his HIA pump per pharmacy. ?Asking for May (avoid week of 5/13-5/20. Monday in pm and Friday in am. Scheduling message sent. ?

## 2021-09-06 ENCOUNTER — Ambulatory Visit: Payer: Medicare PPO | Admitting: Pulmonary Disease

## 2021-09-09 ENCOUNTER — Ambulatory Visit: Payer: Medicare PPO | Admitting: Pulmonary Disease

## 2021-09-09 ENCOUNTER — Encounter: Payer: Self-pay | Admitting: Pulmonary Disease

## 2021-09-09 VITALS — BP 138/70 | HR 75 | Temp 98.6°F | Ht 70.0 in | Wt 233.8 lb

## 2021-09-09 DIAGNOSIS — R0609 Other forms of dyspnea: Secondary | ICD-10-CM

## 2021-09-09 NOTE — Patient Instructions (Signed)
Nice to see you again ? ?We will get labs a day early next week to make sure electrolytes, kidney function are ok ? ?Continue inhaler for now, let me know if you need anything in coming weeks ? ?Look into "The Things Our Fathers Saw" about [place] ? ?RTC in 3 months or sooner as needed ?

## 2021-09-10 ENCOUNTER — Encounter: Payer: Self-pay | Admitting: Oncology

## 2021-09-10 LAB — BASIC METABOLIC PANEL
BUN/Creatinine Ratio: 19 (ref 10–24)
BUN: 15 mg/dL (ref 8–27)
CO2: 24 mmol/L (ref 20–29)
Calcium: 9.5 mg/dL (ref 8.6–10.2)
Chloride: 99 mmol/L (ref 96–106)
Creatinine, Ser: 0.8 mg/dL (ref 0.76–1.27)
Glucose: 100 mg/dL — ABNORMAL HIGH (ref 70–99)
Potassium: 4.4 mmol/L (ref 3.5–5.2)
Sodium: 141 mmol/L (ref 134–144)
eGFR: 89 mL/min/{1.73_m2} (ref >59)

## 2021-10-14 ENCOUNTER — Inpatient Hospital Stay: Payer: Medicare PPO | Admitting: Oncology

## 2021-10-17 ENCOUNTER — Encounter: Payer: Self-pay | Admitting: Pulmonary Disease

## 2021-10-17 DIAGNOSIS — C787 Secondary malignant neoplasm of liver and intrahepatic bile duct: Secondary | ICD-10-CM | POA: Diagnosis not present

## 2021-10-17 DIAGNOSIS — Z9221 Personal history of antineoplastic chemotherapy: Secondary | ICD-10-CM | POA: Diagnosis not present

## 2021-10-17 DIAGNOSIS — C189 Malignant neoplasm of colon, unspecified: Secondary | ICD-10-CM | POA: Diagnosis not present

## 2021-10-17 DIAGNOSIS — K769 Liver disease, unspecified: Secondary | ICD-10-CM | POA: Diagnosis not present

## 2021-10-17 DIAGNOSIS — Z79899 Other long term (current) drug therapy: Secondary | ICD-10-CM | POA: Diagnosis not present

## 2021-10-17 DIAGNOSIS — D860 Sarcoidosis of lung: Secondary | ICD-10-CM | POA: Diagnosis not present

## 2021-10-17 DIAGNOSIS — Z9049 Acquired absence of other specified parts of digestive tract: Secondary | ICD-10-CM | POA: Diagnosis not present

## 2021-10-17 DIAGNOSIS — D869 Sarcoidosis, unspecified: Secondary | ICD-10-CM | POA: Diagnosis not present

## 2021-10-17 DIAGNOSIS — Z85038 Personal history of other malignant neoplasm of large intestine: Secondary | ICD-10-CM | POA: Diagnosis not present

## 2021-10-17 DIAGNOSIS — Z08 Encounter for follow-up examination after completed treatment for malignant neoplasm: Secondary | ICD-10-CM | POA: Diagnosis not present

## 2021-10-17 DIAGNOSIS — Z923 Personal history of irradiation: Secondary | ICD-10-CM | POA: Diagnosis not present

## 2021-10-17 DIAGNOSIS — J90 Pleural effusion, not elsewhere classified: Secondary | ICD-10-CM | POA: Diagnosis not present

## 2021-10-17 DIAGNOSIS — J9811 Atelectasis: Secondary | ICD-10-CM | POA: Diagnosis not present

## 2021-10-18 ENCOUNTER — Encounter: Payer: Self-pay | Admitting: Oncology

## 2021-10-18 ENCOUNTER — Inpatient Hospital Stay: Payer: Medicare PPO | Attending: Oncology | Admitting: Oncology

## 2021-10-18 VITALS — BP 149/81 | HR 78 | Temp 97.9°F | Resp 18 | Ht 70.0 in | Wt 227.8 lb

## 2021-10-18 DIAGNOSIS — C180 Malignant neoplasm of cecum: Secondary | ICD-10-CM | POA: Diagnosis not present

## 2021-10-18 DIAGNOSIS — C787 Secondary malignant neoplasm of liver and intrahepatic bile duct: Secondary | ICD-10-CM | POA: Diagnosis not present

## 2021-10-18 NOTE — Progress Notes (Signed)
?[place] ?OFFICE PROGRESS NOTE ? ? ?Diagnosis: Colon cancer ? ?INTERVAL HISTORY:  ? ?Luis [name] returns as scheduled.  He has been followed at [facility] with a hepatic infusion pump.  He received SBRT to a liver lesion in January 2023.  He has a new segment 3 lesion.  He is being scheduled for SBRT. ?Luis. [name] generally feels well.  Good appetite.  He is working.  He has chronic exertional dyspnea. ? ?Objective: ? ?Vital signs in last 24 hours: ? ?Blood pressure (!) 149/81, pulse 78, temperature 97.9 ?F (36.6 ?C), temperature source Oral, resp. rate 18, height $RemoveBe'5\' 10"'SSrTUHbNF$  (1.778 m), weight 227 lb 12.8 oz (103.3 kg), SpO2 95 %. ?  ? ?Lymphatics: No cervical, supraclavicular, axillary, or inguinal nodes ?Resp: Lungs clear bilaterally ?Cardio: Regular rate and rhythm ?GI: No hepatosplenomegaly, left abdomen infusion pump ?Vascular: No leg edema  ? ?Portacath/PICC-without erythema ? ?Lab Results: ? ?Lab Results  ?Component Value Date  ? WBC 4.5 08/01/2021  ? HGB 12.7 (L) 08/01/2021  ? HCT 40.6 08/01/2021  ? MCV 84 08/01/2021  ? PLT 171 08/01/2021  ? NEUTROABS 4.9 05/29/2021  ? ? ?CMP  ?Lab Results  ?Component Value Date  ? NA 141 09/09/2021  ? K 4.4 09/09/2021  ? CL 99 09/09/2021  ? CO2 24 09/09/2021  ? GLUCOSE 100 (H) 09/09/2021  ? BUN 15 09/09/2021  ? CREATININE 0.80 09/09/2021  ? CALCIUM 9.5 09/09/2021  ? PROT 6.6 05/29/2021  ? ALBUMIN 4.0 05/29/2021  ? AST 50 (H) 05/29/2021  ? ALT 33 05/29/2021  ? ALKPHOS 114 05/29/2021  ? BILITOT 0.7 05/29/2021  ? GFRNONAA >60 05/29/2021  ? GFRAA >60 03/10/2020  ? ? ?Lab Results  ?Component Value Date  ? CEA1 2.10 04/14/2020  ? CEA 1.3 06/20/2017  ? ? ? ?Medications: I have reviewed the patient's current medications. ? ? ?Assessment/Plan: ?Adenocarcinoma of the cecum, stage IIA (T3N0), status post a right colectomy 07/26/2017 ?0/16 lymph nodes positive, no lymphovascular invasion, perineural invasion present ?MSI-stable, no loss of mismatch repair protein  expression ?Surveillance colonoscopy 07/26/2018-no polyps found ?Elevated CEA June 2020, persistently elevated August 2020 ?CTs 02/07/2019- bilateral liver metastases, no extrahepatic metastatic disease, changes of sarcoidosis in the chest ?Biopsy liver lesion 03/04/2019-metastatic adenocarcinoma, with morphology consistent with metastatic colonic adenocarcinoma, RAS WT ?Cycle 1 FOLFOX 03/12/2019 ?Cycle 2 FOLFOX 03/26/2019, oxaliplatin dose reduced secondary to thrombocytopenia ?Cycle 3 FOLFOX 04/09/2019, oxaliplatin further dose reduced and aspirin held secondary to thrombocytopenia ?Cycle 4 FOLFOX 04/23/2019 ?Cycle 5 FOLFOX 05/07/2019 ?CTs 05/23/2019-decrease in liver lesions.  No new or progressive findings. ?Cycle 6 FOLFOX 05/27/2019 ?Cycle 7 FOLFOX 06/14/2019 ?Cycle 8 FOLFOX 07/02/2019-Udenyca held ?Cycle 9 FOLFOX 07/16/2019-Udenyca held ?Cycle 10 FOLFOX 07/30/2019-Udenyca held ?CT abdomen/pelvis 08/13/2019-mild improvement in previously noted liver lesions ?MRI abdomen 08/13/2019-3 liver lesions  ?Cycle 1 FOLFIRI/Avastin 09/17/2019 ?Cycle 2 FOLFIRI/Avastin 10/01/2019 ?Cycle 3 FOLFIRI/Avastin 10/15/2019 ?Cycle 4 FOLFIRI/Avastin 11/05/2019 ?Cycle 5 FOLFIRI/Avastin 11/19/2019 ?CTs 11/27/2019-stable liver lesions; no new liver lesions.  No other evidence of metastatic disease in the abdomen or pelvis. ?Cycle 1 FOLFIRI/Panitumumab 12/03/2019 ?Cycle 2 FOLFIRI/Panitumumab 12/17/2019 (Panitumumab dose reduced due to rash) ?Cycle 3 FOLFIRI/Panitumumab 01/01/2020 ?Cycle 4 FOLFIRI/Panitumumab 01/14/2020 ?Cycle 5 FOLFIRI/Panitumumab 01/28/2020 ?CT abdomen/pelvis 02/06/2020-decreased size of right hepatic lesions, subtle lesion in the lateral left hepatic lobe-not clearly identified, no evidence of disease progression ?Cycle 6 FOLFIRI/Panitumumab 02/11/2020 ?Cycle 7 FOLFIRI/Panitumumab 03/10/2020 ?Cycle 8 FOLFIRI/Panitumumab 03/26/2020 ?Cycle 9 FOLFIRI/Panitumumab 04/14/2020 ?CT abdomen/pelvis 04/26/2020-mild enlargement of 2 liver lesions on initial  report, repeat  review with radiology consistent with stable disease ?Cycle 10 FOLFIRI/Panitumumab 04/29/2020 ?MRI liver 05/19/2020-right hepatic lobe lesions diminished in size when compared to the study of March 2021.  Lesion in hepatic subsegment VI with very limited assessment as well due to adjacent bowel. ?05/20/2020 referred to interventional radiology to consider ablation of liver lesions ?CT abdomen 06/30/2020 prior to planned ablation procedure, enlargement of segment 7 liver lesion and segment 6 liver lesion.  Interval recurrence of segment 2 lesion measuring 4.5 cm, ablation aborted ?07/23/2020-diagnostic laparoscopy, robotic assisted insertion of hepatic infusion pump, portal lymphadenectomy-granulomatous inflammation compatible with sarcoidosis, core tumor biopsy segment 6, infiltrating adenocarcinoma ?08/02/2020-cycle 1 FUDR at 50% dose ?08/16/2020-FOLFIRI/panitumumab ?CTs 10/25/2020-significantly decreased size of hepatic metastases ?10/25/2020 cycle 6 FOLFIRI/Panitumumab, FUDR # 4 ?11/09/2020-cycle 7 FOLFIRI/panitumumab ?01/06/2021-diagnostic laparoscopy, partial hepatectomy-segment 2, segment 6, microwave ablation segment 7-3 tumors identified, segment 2 and 6 tumors resected-adenocarcinoma with negative margins, segment 2 lesion with less than 1 mm hepatic parenchymal margin ?03/28/2021-CTs with slight increase in right pleural effusion, CEA 1.5 ?05/16/2021-increasing ill-defined lesion in segment 7, MRI-posttreatment change of the liver with heterogenous hypoenhancement adjacent to hepatic segment 7 ablation site suspicious for local recurrence ?January 2023-SBRT to segment 7 ablation cavity ?08/08/2021-stable posttreatment changes in liver, segment 7 ablation cavity unchanged, no new metastatic disease, increasing pleural effusion ?10/17/2021, CT-interval increase in left hepatic lobe segment 3 liver lesion measuring up to 2 cm ? ?History of colon polyps ?Microcytic anemia January 2019 ?Sarcoidosis ?Coronary  artery disease ?Port-A-Cath placement, interventional radiology, 03/04/2019 ?Thrombocytopenia, mild thrombocytopenia predating chemotherapy ?Oxaliplatin neuropathy ?Dyspnea on exertion-CT chest 02/25/2020 negative for PE, infection, pneumonitis; scheduled for PFTs 03/10/2020, followed by pulmonary ?  ? ?Disposition: ?Luis. [name] appears well.  He has metastatic colon cancer.  Restaging CTs this week revealed progressive disease involving a left (segment 3) liver lesion.  He is being scheduled for SBRT.  He will continue close clinical follow-up at [facility].  I am available to see him as needed.  The Port-A-Cath and hepatic infusion catheters are being flushed at [facility]. ? ?Luis. [name] will return for an office visit in 4 months. ? ?Betsy Coder, MD ? ?10/18/2021  ?8:26 AM ? ? ?

## 2021-10-21 DIAGNOSIS — C189 Malignant neoplasm of colon, unspecified: Secondary | ICD-10-CM | POA: Diagnosis not present

## 2021-10-21 DIAGNOSIS — C787 Secondary malignant neoplasm of liver and intrahepatic bile duct: Secondary | ICD-10-CM | POA: Diagnosis not present

## 2021-10-28 ENCOUNTER — Encounter: Payer: Self-pay | Admitting: Internal Medicine

## 2021-11-03 DIAGNOSIS — C787 Secondary malignant neoplasm of liver and intrahepatic bile duct: Secondary | ICD-10-CM | POA: Diagnosis not present

## 2021-11-08 DIAGNOSIS — C787 Secondary malignant neoplasm of liver and intrahepatic bile duct: Secondary | ICD-10-CM | POA: Diagnosis not present

## 2021-11-09 DIAGNOSIS — C787 Secondary malignant neoplasm of liver and intrahepatic bile duct: Secondary | ICD-10-CM | POA: Diagnosis not present

## 2021-11-10 DIAGNOSIS — C787 Secondary malignant neoplasm of liver and intrahepatic bile duct: Secondary | ICD-10-CM | POA: Diagnosis not present

## 2021-11-11 DIAGNOSIS — C787 Secondary malignant neoplasm of liver and intrahepatic bile duct: Secondary | ICD-10-CM | POA: Diagnosis not present

## 2021-11-14 DIAGNOSIS — C787 Secondary malignant neoplasm of liver and intrahepatic bile duct: Secondary | ICD-10-CM | POA: Diagnosis not present

## 2021-11-15 DIAGNOSIS — C787 Secondary malignant neoplasm of liver and intrahepatic bile duct: Secondary | ICD-10-CM | POA: Diagnosis not present

## 2021-11-16 DIAGNOSIS — C787 Secondary malignant neoplasm of liver and intrahepatic bile duct: Secondary | ICD-10-CM | POA: Diagnosis not present

## 2021-11-17 DIAGNOSIS — C787 Secondary malignant neoplasm of liver and intrahepatic bile duct: Secondary | ICD-10-CM | POA: Diagnosis not present

## 2021-11-20 NOTE — Progress Notes (Signed)
$'@Patient'p$  ID: Luis [name], male    DOB: [birth year], 81 y.o.   MRN: [id number]  Chief Complaint  Patient presents with   Follow-up    Pt states he is still SOB. Fluid was taken off again.     Referring provider: [name], MD  HPI:   81 y.o. man whom we are seeing in follow-up of multiple pulmonary issues including burnt out sarcoid, asthma, ongoing chronic of right pleural effusion.  In interim since last visit, repeat thoracentesis was performed 3/72023.  Symptom of dyspnea improved after thoracentesis.[name] ongoing oncologic follow-up at [facility].  Continues to have recurrence of effusion on serial images to oncologic care.   HPI at initial visit: Overall, disorders are largely unchanged.  Formally followed with [name] for sarcoidosis.  This has been quiescent for some time.  Most recent CT scan reviewed which reveals on my interpretation stable bilateral fibrosis, scattered calcified granulomas in the lung as well as significant calcified hilar and mediastinal lymphadenopathy and sequela of prior lung surgery.  He has been seen in clinic recently by [name].  Primary complaint was dyspnea on exertion as well as cough.  These persist.  Chest imaging obtained as above.  PFTs obtained last week which reveal suggestion of mild to moderate restriction on spirometry without significant bronchodilator response.  Moderate restriction confirmed with total lung capacity 69% of predicted.  DLCO corrects to within normal limits with adjustment for hemoglobin.  Cough and shortness of breath coincide with worsening nasal congestion.  Postnasal drip.  Not really using meds to address this this.  He is tolerating chemotherapy well.  Review of labs indicate stable hemoglobin without concern for symptomatic anemia at this time.   Questionaires / Pulmonary Flowsheets:   ACT:      No data to display          MMRC: mMRC Dyspnea Scale mMRC Score  08/09/2020  3:42 PM 0     Epworth:      No data to display          Tests:   FENO:  No results found for: "NITRICOXIDE"  PFT:    Latest Ref Rng & Units 03/10/2020    3:36 PM 08/30/2017    3:59 PM  PFT Results  FVC-Pre L 2.94  P 3.14   FVC-Predicted Pre % 68  P 72   FVC-Post L 2.90  P 3.18   FVC-Predicted Post % 67  P 73   Pre FEV1/FVC % % 66  P 70   Post FEV1/FCV % % 68  P 73   FEV1-Pre L 1.93  P 2.19   FEV1-Predicted Pre % 62  P 69   FEV1-Post L 1.96  P 2.31   DLCO uncorrected ml/min/mmHg 19.10  P 17.73   DLCO UNC% % 75  P 52   DLCO corrected ml/min/mmHg 21.40  P 20.68   DLCO COR %Predicted % 84  P 61   DLVA Predicted % 118  P 98   TLC L 5.02  P 4.99   TLC % Predicted % 69  P 68   RV % Predicted % 76  P 71     P Preliminary result   Personally reviewed and interpreted as normal spirometry, reduced DLCO, moderate reduced TLC  WALK:     08/28/2017   12:00 PM  SIX MIN WALK  Supplimental Oxygen during Test? (L/min) No  Tech Comments: pt walked a fast pace, tolerated walk well.  Imaging: Personally reviewed and as per EMR and discussion in this note  No results found.  Lab Results: Personally reviewed, eosinophils routinely 200-300 CBC    Component Value Date/Time   WBC 4.5 08/01/2021 0857   WBC 4.7 05/29/2021 0359   RBC 4.83 08/01/2021 0857   RBC 4.05 (L) 05/29/2021 0359   HGB 12.7 (L) 08/01/2021 0857   HCT 40.6 08/01/2021 0857   PLT 171 08/01/2021 0857   MCV 84 08/01/2021 0857   MCH 26.3 (L) 08/01/2021 0857   MCH 25.2 (L) 05/29/2021 0359   MCHC 31.3 (L) 08/01/2021 0857   MCHC 30.8 05/29/2021 0359   RDW 20.1 (H) 08/01/2021 0857   LYMPHSABS 0.4 (L) 05/29/2021 0054   MONOABS 1.0 05/29/2021 0054   EOSABS 0.1 05/29/2021 0054   BASOSABS 0.0 05/29/2021 0054    BMET    Component Value Date/Time   NA 141 09/09/2021 1719   K 4.4 09/09/2021 1719   CL 99 09/09/2021 1719   CO2 24 09/09/2021 1719   GLUCOSE 100 (H) 09/09/2021 1719   GLUCOSE 112 (H) 05/29/2021 0359    BUN 15 09/09/2021 1719   CREATININE 0.80 09/09/2021 1719   CREATININE 0.83 06/07/2020 1119   CREATININE 1.02 05/16/2016 0937   CALCIUM 9.5 09/09/2021 1719   GFRNONAA >60 05/29/2021 0359   GFRNONAA >60 06/07/2020 1119   GFRAA >60 03/10/2020 0855    BNP    Component Value Date/Time   BNP 79.4 01/22/2021 1634   BNP 43.4 05/16/2016 0937    ProBNP    Component Value Date/Time   PROBNP 69 08/01/2021 0857   PROBNP 60.0 08/10/2014 1052    Specialty Problems       Pulmonary Problems   Pulmonary fibrosis (HCC)   DOE (dyspnea on exertion)   Asthmatic bronchitis with acute exacerbation    No Known Allergies  Immunization History  Administered Date(s) Administered   DTaP / IPV 09/25/2012   Fluad Quad(high Dose 65+) 02/19/2019   Influenza Split 03/27/2014   Influenza, High Dose Seasonal PF 03/27/2014, 01/27/2017, 02/22/2018, 03/12/2021   Influenza-Unspecified 02/24/2017, 03/10/2020   PFIZER Comirnaty(Gray Top)Covid-19 Tri-Sucrose Vaccine 06/27/2019, 07/21/2019   PFIZER(Purple Top)SARS-COV-2 Vaccination 07/11/2019, 07/21/2019, 01/26/2020, 10/26/2020   Pneumococcal Conjugate-13 03/27/2014   Pneumococcal Polysaccharide-23 11/10/2009   Pneumococcal-Unspecified 03/12/2014   Tdap 03/24/2011, 09/25/2012, 05/28/2021   Zoster Recombinat (Shingrix) 09/01/2018, 02/19/2019   Zoster, Live 03/21/2010    Past Medical History:  Diagnosis Date   Adenocarcinoma of cecum ([place]) 06/21/2017   Arthritis    "mid back; hands; knees" (02/24/2015)   Basal cell carcinoma    left shoulder; mid chest; right eyelid (02/24/2015)   CAD (coronary artery disease)    a. 08/2014 Cath/PCI: LM nl, LAD 30p, D1 95 (2.25x12 Resolute Integrity DES), LCX small, RI 100 (attempted PCI) - branches fill via L->L collats, RCA dominant, nl, RPDA/PLA nl, EF 60^. b. 02/24/2015 PCI CTO of Ramus DES x2.   Chronic bronchitis ([place])    hx   Diverticulosis 05/2005   Elevated lipase    Fuchs' corneal dystrophy    History of  adenomatous polyp of colon 05/2005   8 mm adenoma   History of blood transfusion    "related to some of my surgeries"   Hyperlipidemia    Hypertension    Iron deficiency anemia due to chronic blood loss 06/21/2017   Pneumonia    Prostate cancer ([place]) 2011   S/P seed implant   Sarcoidosis    Thrombocytopenia ([place])    a.  Noted on prior labs, unclear of what w/u done.   Trifascicular block     Tobacco History: Social History   Tobacco Use  Smoking Status Light Smoker   Packs/day: 0.00   Years: 59.00   Total pack years: 0.00   Types: Cigars, Cigarettes   Last attempt to quit: 02/24/2015   Years since quitting: 6.7  Smokeless Tobacco Never  Tobacco Comments   02/24/2015 "quit cigarettes ~ 2000 ago but smokes cigars once/month"verified 03/10/21. Once in a while.    Ready to quit: Not Answered Counseling given: Not Answered Tobacco comments: 02/24/2015 "quit cigarettes ~ 2000 ago but smokes cigars once/month"verified 03/10/21. Once in a while.    Continue to not smoke  Outpatient Encounter Medications as of 09/09/2021  Medication Sig   acetaminophen (TYLENOL) 325 MG tablet Take 650 mg by mouth every 6 (six) hours as needed for moderate pain.   albuterol (VENTOLIN HFA) 108 (90 Base) MCG/ACT inhaler Inhale 1-2 puffs into the lungs every 6 (six) hours as needed for wheezing or shortness of breath.   amLODipine (NORVASC) 5 MG tablet Take 1 tablet (5 mg total) by mouth daily.   Cholecalciferol (VITAMIN D3) 2000 units capsule Take 2,000 Units by mouth daily.   clopidogrel (PLAVIX) 75 MG tablet Take 75 mg by mouth daily.   furosemide (LASIX) 20 MG tablet Take 20 mg by mouth.   gabapentin (NEURONTIN) 100 MG capsule Take 200 mg by mouth daily as needed (neuropathy).   ibuprofen (ADVIL) 100 MG tablet Take 100 mg by mouth every 6 (six) hours as needed for pain.   Multiple Vitamins-Minerals (VITRUM SENIOR) TABS Take 1 tablet by mouth daily.   naproxen sodium (ALEVE) 220 MG tablet Take 440 mg  by mouth daily as needed (pain).   nitroGLYCERIN (NITROSTAT) 0.4 MG SL tablet PLACE 1 TABLET UNDER THE TONGUE EVERY 5 MINUTES AS NEEDED FOR CHEST PAIN FOR 3 DOSES (Patient taking differently: Place 0.4 mg under the tongue every 5 (five) minutes as needed.)   ranolazine (RANEXA) 500 MG 12 hr tablet TAKE 1 TABLET BY MOUTH TWICE A DAY   rosuvastatin (CRESTOR) 20 MG tablet TAKE 1 TABLET BY MOUTH EVERY DAY   traMADol (ULTRAM) 50 MG tablet Take 2 tablets (100 mg total) by mouth every 6 (six) hours as needed.   No facility-administered encounter medications on file as of 09/09/2021.     Review of Systems  N/a  Physical Exam  BP 138/70 (BP Location: Left Arm, Patient Position: Sitting, Cuff Size: Normal)   Pulse 75   Temp 98.6 F (37 C) (Oral)   Ht '5\' 10"'$  (1.778 m)   Wt 233 lb 12.8 oz (106.1 kg)   SpO2 95%   BMI 33.55 kg/m   Wt Readings from Last 5 Encounters:  10/18/21 227 lb 12.8 oz (103.3 kg)  09/09/21 233 lb 12.8 oz (106.1 kg)  08/01/21 239 lb (108.4 kg)  06/27/21 234 lb 6.4 oz (106.3 kg)  06/07/21 234 lb 9.6 oz (106.4 kg)    BMI Readings from Last 5 Encounters:  10/18/21 32.69 kg/m  09/09/21 33.55 kg/m  08/01/21 34.29 kg/m  06/27/21 33.63 kg/m  06/07/21 33.66 kg/m     Physical Exam General: Well-appearing, no acute distress Eyes: EOMI, no icterus Neck: No JVD supple Respiratory:L clear to auscultation bilaterally, no wheezes or crackles, diminished in the right base Cardiovascular: Regular rhythm, no murmurs Abdomen: Nondistended, bowel sounds present Psych: Normal mood, full affect   Assessment & Plan:   Recurrent right  pleural effusion: Suspect related to local inflammation due to metastasis to liver, liver resection.  Cytology negative.  Improvement in dyspnea with drainage.  Continue as needed thoracentesis.  Asthma: Presumed diagnosis given atopic symptoms and dyspnea on exertion.  Historically responded well to ICS/LABA therapy.  Response to prednisone  intermittently.  Sarcoidosis: Burnt out, no evidence of active disease on recent imaging.  Return in about 3 months (around 12/09/2021).   Lanier Clam, MD 11/20/2021

## 2021-11-25 ENCOUNTER — Ambulatory Visit: Payer: Medicare PPO | Admitting: Pulmonary Disease

## 2021-11-25 ENCOUNTER — Encounter: Payer: Self-pay | Admitting: Pulmonary Disease

## 2021-11-25 VITALS — BP 130/64 | HR 84 | Temp 98.5°F | Ht 70.0 in | Wt 229.6 lb

## 2021-11-25 DIAGNOSIS — J90 Pleural effusion, not elsewhere classified: Secondary | ICD-10-CM

## 2021-11-25 DIAGNOSIS — R0609 Other forms of dyspnea: Secondary | ICD-10-CM | POA: Diagnosis not present

## 2021-11-25 NOTE — Patient Instructions (Addendum)
I sent a message to get a thoracentesis done on Tuesday - If you have not heard from [place] by mid day Monday send me a message

## 2021-11-28 ENCOUNTER — Telehealth: Payer: Self-pay | Admitting: Pulmonary Disease

## 2021-11-28 DIAGNOSIS — J90 Pleural effusion, not elsewhere classified: Secondary | ICD-10-CM

## 2021-11-28 NOTE — H&P (View-Only) (Signed)
$'@Patient'P$  ID: Luis [name], male    DOB: June 09, 1941, 81 y.o.   MRN: [id number]  Chief Complaint  Patient presents with   Follow-up    Pt states he is having more fluid build up in his chest. He states he stopped the Plavix a few days ago in hopes of getting it drained.     Referring provider: [name], MD  HPI:   81 y.o. man whom we are seeing in follow-up of multiple pulmonary issues including burnt out sarcoid, asthma, ongoing chronic of right pleural effusion.  Most recent oncology note from [place] reviewed x2.  Doing well. Serial scans at [facility] or oncologic surveillance show accumulation of pleural effusion on my review. Felt symptomatic several weeks ago. But slowly improved.  Then found to be a bit more dyspneic over the last week or 2.  Overall doing fine otherwise.  Last dose Plavix taken Wednesday night and hopeful anticipation of thoracentesis to be performed in the near future.   HPI at initial visit: Overall, disorders are largely unchanged.  Formally followed with [name] for sarcoidosis.  This has been quiescent for some time.  Most recent CT scan reviewed which reveals on my interpretation stable bilateral fibrosis, scattered calcified granulomas in the lung as well as significant calcified hilar and mediastinal lymphadenopathy and sequela of prior lung surgery.  He has been seen in clinic recently by [name].  Primary complaint was dyspnea on exertion as well as cough.  These persist.  Chest imaging obtained as above.  PFTs obtained last week which reveal suggestion of mild to moderate restriction on spirometry without significant bronchodilator response.  Moderate restriction confirmed with total lung capacity 69% of predicted.  DLCO corrects to within normal limits with adjustment for hemoglobin.  Cough and shortness of breath coincide with worsening nasal congestion.  Postnasal drip.  Not really using meds to address this this.  He is tolerating chemotherapy  well.  Review of labs indicate stable hemoglobin without concern for symptomatic anemia at this time.   Questionaires / Pulmonary Flowsheets:   ACT:      No data to display           MMRC: mMRC Dyspnea Scale mMRC Score  08/09/2020  3:42 PM 0    Epworth:      No data to display           Tests:   FENO:  No results found for: "NITRICOXIDE"  PFT:    Latest Ref Rng & Units 03/10/2020    3:36 PM 08/30/2017    3:59 PM  PFT Results  FVC-Pre L 2.94  P 3.14   FVC-Predicted Pre % 68  P 72   FVC-Post L 2.90  P 3.18   FVC-Predicted Post % 67  P 73   Pre FEV1/FVC % % 66  P 70   Post FEV1/FCV % % 68  P 73   FEV1-Pre L 1.93  P 2.19   FEV1-Predicted Pre % 62  P 69   FEV1-Post L 1.96  P 2.31   DLCO uncorrected ml/min/mmHg 19.10  P 17.73   DLCO UNC% % 75  P 52   DLCO corrected ml/min/mmHg 21.40  P 20.68   DLCO COR %Predicted % 84  P 61   DLVA Predicted % 118  P 98   TLC L 5.02  P 4.99   TLC % Predicted % 69  P 68   RV % Predicted % 76  P 71  P Preliminary result   Personally reviewed and interpreted as normal spirometry, reduced DLCO, moderate reduced TLC  WALK:     08/28/2017   12:00 PM  SIX MIN WALK  Supplimental Oxygen during Test? (L/min) No  Tech Comments: pt walked a fast pace, tolerated walk well.     Imaging: Personally reviewed and as per EMR and discussion in this note  No results found.  Lab Results: Personally reviewed, eosinophils routinely 200-300 CBC    Component Value Date/Time   WBC 4.5 08/01/2021 0857   WBC 4.7 05/29/2021 0359   RBC 4.83 08/01/2021 0857   RBC 4.05 (L) 05/29/2021 0359   HGB 12.7 (L) 08/01/2021 0857   HCT 40.6 08/01/2021 0857   PLT 171 08/01/2021 0857   MCV 84 08/01/2021 0857   MCH 26.3 (L) 08/01/2021 0857   MCH 25.2 (L) 05/29/2021 0359   MCHC 31.3 (L) 08/01/2021 0857   MCHC 30.8 05/29/2021 0359   RDW 20.1 (H) 08/01/2021 0857   LYMPHSABS 0.4 (L) 05/29/2021 0054   MONOABS 1.0 05/29/2021 0054   EOSABS 0.1  05/29/2021 0054   BASOSABS 0.0 05/29/2021 0054    BMET    Component Value Date/Time   NA 141 09/09/2021 1719   K 4.4 09/09/2021 1719   CL 99 09/09/2021 1719   CO2 24 09/09/2021 1719   GLUCOSE 100 (H) 09/09/2021 1719   GLUCOSE 112 (H) 05/29/2021 0359   BUN 15 09/09/2021 1719   CREATININE 0.80 09/09/2021 1719   CREATININE 0.83 06/07/2020 1119   CREATININE 1.02 05/16/2016 0937   CALCIUM 9.5 09/09/2021 1719   GFRNONAA >60 05/29/2021 0359   GFRNONAA >60 06/07/2020 1119   GFRAA >60 03/10/2020 0855    BNP    Component Value Date/Time   BNP 79.4 01/22/2021 1634   BNP 43.4 05/16/2016 0937    ProBNP    Component Value Date/Time   PROBNP 69 08/01/2021 0857   PROBNP 60.0 08/10/2014 1052    Specialty Problems       Pulmonary Problems   Pulmonary fibrosis (HCC)   DOE (dyspnea on exertion)   Asthmatic bronchitis with acute exacerbation    No Known Allergies  Immunization History  Administered Date(s) Administered   DTaP / IPV 09/25/2012   Fluad Quad(high Dose 65+) 02/19/2019   Influenza Split 03/27/2014   Influenza, High Dose Seasonal PF 03/27/2014, 01/27/2017, 02/22/2018, 03/12/2021   Influenza-Unspecified 02/24/2017, 03/10/2020   PFIZER Comirnaty(Gray Top)Covid-19 Tri-Sucrose Vaccine 06/27/2019, 07/21/2019   PFIZER(Purple Top)SARS-COV-2 Vaccination 07/11/2019, 07/21/2019, 01/26/2020, 10/26/2020   Pneumococcal Conjugate-13 03/27/2014   Pneumococcal Polysaccharide-23 11/10/2009   Pneumococcal-Unspecified 03/12/2014   Tdap 03/24/2011, 09/25/2012, 05/28/2021   Zoster Recombinat (Shingrix) 09/01/2018, 02/19/2019   Zoster, Live 03/21/2010    Past Medical History:  Diagnosis Date   Adenocarcinoma of cecum ([place]) 06/21/2017   Arthritis    "mid back; hands; knees" (02/24/2015)   Basal cell carcinoma    left shoulder; mid chest; right eyelid (02/24/2015)   CAD (coronary artery disease)    a. 08/2014 Cath/PCI: LM nl, LAD 30p, D1 95 (2.25x12 Resolute Integrity DES), LCX  small, RI 100 (attempted PCI) - branches fill via L->L collats, RCA dominant, nl, RPDA/PLA nl, EF 60^. b. 02/24/2015 PCI CTO of Ramus DES x2.   Chronic bronchitis ([place])    hx   Diverticulosis 05/2005   Elevated lipase    Fuchs' corneal dystrophy    History of adenomatous polyp of colon 05/2005   8 mm adenoma   History of blood transfusion    "  related to some of my surgeries"   Hyperlipidemia    Hypertension    Iron deficiency anemia due to chronic blood loss 06/21/2017   Pneumonia    Prostate cancer ([place]) 2011   S/P seed implant   Sarcoidosis    Thrombocytopenia ([place])    a. Noted on prior labs, unclear of what w/u done.   Trifascicular block     Tobacco History: Social History   Tobacco Use  Smoking Status Light Smoker   Packs/day: 0.00   Years: 59.00   Total pack years: 0.00   Types: Cigars, Cigarettes   Last attempt to quit: 02/24/2015   Years since quitting: 6.7  Smokeless Tobacco Never  Tobacco Comments   02/24/2015 "quit cigarettes ~ 2000 ago but smokes cigars once/month"verified 03/10/21. Once in a while.    Ready to quit: Not Answered Counseling given: Not Answered Tobacco comments: 02/24/2015 "quit cigarettes ~ 2000 ago but smokes cigars once/month"verified 03/10/21. Once in a while.    Continue to not smoke  Outpatient Encounter Medications as of 11/25/2021  Medication Sig   acetaminophen (TYLENOL) 325 MG tablet Take 650 mg by mouth every 6 (six) hours as needed for moderate pain.   albuterol (VENTOLIN HFA) 108 (90 Base) MCG/ACT inhaler Inhale 1-2 puffs into the lungs every 6 (six) hours as needed for wheezing or shortness of breath.   amLODipine (NORVASC) 5 MG tablet Take 1 tablet (5 mg total) by mouth daily.   Cholecalciferol (VITAMIN D3) 2000 units capsule Take 2,000 Units by mouth daily.   clopidogrel (PLAVIX) 75 MG tablet Take 75 mg by mouth daily.   furosemide (LASIX) 20 MG tablet Take 20 mg by mouth.   gabapentin (NEURONTIN) 100 MG capsule Take 200 mg by  mouth daily as needed (neuropathy).   ibuprofen (ADVIL) 100 MG tablet Take 100 mg by mouth every 6 (six) hours as needed for pain.   Multiple Vitamins-Minerals (VITRUM SENIOR) TABS Take 1 tablet by mouth daily.   naproxen sodium (ALEVE) 220 MG tablet Take 440 mg by mouth daily as needed (pain).   nitroGLYCERIN (NITROSTAT) 0.4 MG SL tablet PLACE 1 TABLET UNDER THE TONGUE EVERY 5 MINUTES AS NEEDED FOR CHEST PAIN FOR 3 DOSES (Patient taking differently: Place 0.4 mg under the tongue every 5 (five) minutes as needed.)   ranolazine (RANEXA) 500 MG 12 hr tablet TAKE 1 TABLET BY MOUTH TWICE A DAY   rosuvastatin (CRESTOR) 20 MG tablet TAKE 1 TABLET BY MOUTH EVERY DAY   traMADol (ULTRAM) 50 MG tablet Take 2 tablets (100 mg total) by mouth every 6 (six) hours as needed.   No facility-administered encounter medications on file as of 11/25/2021.     Review of Systems  N/a  Physical Exam  BP 130/64 (BP Location: Left Arm, Patient Position: Sitting, Cuff Size: Normal)   Pulse 84   Temp 98.5 F (36.9 C) (Oral)   Ht '5\' 10"'$  (1.778 m)   Wt 229 lb 9.6 oz (104.1 kg)   SpO2 94%   BMI 32.94 kg/m   Wt Readings from Last 5 Encounters:  11/25/21 229 lb 9.6 oz (104.1 kg)  10/18/21 227 lb 12.8 oz (103.3 kg)  09/09/21 233 lb 12.8 oz (106.1 kg)  08/01/21 239 lb (108.4 kg)  06/27/21 234 lb 6.4 oz (106.3 kg)    BMI Readings from Last 5 Encounters:  11/25/21 32.94 kg/m  10/18/21 32.69 kg/m  09/09/21 33.55 kg/m  08/01/21 34.29 kg/m  06/27/21 33.63 kg/m     Physical  Exam General: Well-appearing, no acute distress Eyes: EOMI, no icterus Neck: No JVD supple Respiratory:L clear to auscultation bilaterally, no wheezes or crackles, diminished in the right base Cardiovascular: Regular rhythm, no murmurs Abdomen: Nondistended, bowel sounds present Psych: Normal mood, full affect   Assessment & Plan:   Recurrent right pleural effusion: Suspect related to local inflammation due to metastasis to  liver, liver resection.  Cytology negative.  Improvement in dyspnea with drainage.  Plan repeat thoracentesis next week, holding Plavix for 5 days, tentative plan Tuesday 6/20.   Sarcoidosis: Burnt out, no evidence of active disease on recent imaging.  Return in about 3 months (around 02/25/2022).   Lanier Clam, MD 11/28/2021

## 2021-11-28 NOTE — Telephone Encounter (Signed)
Provided Star with CPT code of 76808 for thora tomorrow. She expressed understanding. Nothing further needed at this time.

## 2021-11-28 NOTE — Telephone Encounter (Signed)
Pt has been scheduled for tomorrow at 2:00 with 1:30 arrival.  Spoke to pt & gave him appt info.  Nothing further needed.

## 2021-11-28 NOTE — Progress Notes (Signed)
$'@Patient'c$  ID: Luis [name], male    DOB: 17-Oct-1940, 81 y.o.   MRN: [id number]  Chief Complaint  Patient presents with   Follow-up    Pt states he is having more fluid build up in his chest. He states he stopped the Plavix a few days ago in hopes of getting it drained.     Referring provider: [name], MD  HPI:   81 y.o. man whom we are seeing in follow-up of multiple pulmonary issues including burnt out sarcoid, asthma, ongoing chronic of right pleural effusion.  Most recent oncology note from [facility] reviewed x2.  Doing well. Serial scans at [facility] or oncologic surveillance show accumulation of pleural effusion on my review. Felt symptomatic several weeks ago. But slowly improved.  Then found to be a bit more dyspneic over the last week or 2.  Overall doing fine otherwise.  Last dose Plavix taken Wednesday night and hopeful anticipation of thoracentesis to be performed in the near future.   HPI at initial visit: Overall, disorders are largely unchanged.  Formally followed with [name] for sarcoidosis.  This has been quiescent for some time.  Most recent CT scan reviewed which reveals on my interpretation stable bilateral fibrosis, scattered calcified granulomas in the lung as well as significant calcified hilar and mediastinal lymphadenopathy and sequela of prior lung surgery.  He has been seen in clinic recently by [name].  Primary complaint was dyspnea on exertion as well as cough.  These persist.  Chest imaging obtained as above.  PFTs obtained last week which reveal suggestion of mild to moderate restriction on spirometry without significant bronchodilator response.  Moderate restriction confirmed with total lung capacity 69% of predicted.  DLCO corrects to within normal limits with adjustment for hemoglobin.  Cough and shortness of breath coincide with worsening nasal congestion.  Postnasal drip.  Not really using meds to address this this.  He is tolerating chemotherapy  well.  Review of labs indicate stable hemoglobin without concern for symptomatic anemia at this time.   Questionaires / Pulmonary Flowsheets:   ACT:      No data to display           MMRC: mMRC Dyspnea Scale mMRC Score  08/09/2020  3:42 PM 0    [place]:      No data to display           Tests:   FENO:  No results found for: "NITRICOXIDE"  PFT:    Latest Ref Rng & Units 03/10/2020    3:36 PM 08/30/2017    3:59 PM  PFT Results  FVC-Pre L 2.94  P 3.14   FVC-Predicted Pre % 68  P 72   FVC-Post L 2.90  P 3.18   FVC-Predicted Post % 67  P 73   Pre FEV1/FVC % % 66  P 70   Post FEV1/FCV % % 68  P 73   FEV1-Pre L 1.93  P 2.19   FEV1-Predicted Pre % 62  P 69   FEV1-Post L 1.96  P 2.31   DLCO uncorrected ml/min/mmHg 19.10  P 17.73   DLCO UNC% % 75  P 52   DLCO corrected ml/min/mmHg 21.40  P 20.68   DLCO COR %Predicted % 84  P 61   DLVA Predicted % 118  P 98   TLC L 5.02  P 4.99   TLC % Predicted % 69  P 68   RV % Predicted % 76  P 71  P Preliminary result   Personally reviewed and interpreted as normal spirometry, reduced DLCO, moderate reduced TLC  WALK:     08/28/2017   12:00 PM  SIX MIN WALK  Supplimental Oxygen during Test? (L/min) No  Tech Comments: pt walked a fast pace, tolerated walk well.     Imaging: Personally reviewed and as per EMR and discussion in this note  No results found.  Lab Results: Personally reviewed, eosinophils routinely 200-300 CBC    Component Value Date/Time   WBC 4.5 08/01/2021 0857   WBC 4.7 05/29/2021 0359   RBC 4.83 08/01/2021 0857   RBC 4.05 (L) 05/29/2021 0359   HGB 12.7 (L) 08/01/2021 0857   HCT 40.6 08/01/2021 0857   PLT 171 08/01/2021 0857   MCV 84 08/01/2021 0857   MCH 26.3 (L) 08/01/2021 0857   MCH 25.2 (L) 05/29/2021 0359   MCHC 31.3 (L) 08/01/2021 0857   MCHC 30.8 05/29/2021 0359   RDW 20.1 (H) 08/01/2021 0857   LYMPHSABS 0.4 (L) 05/29/2021 0054   MONOABS 1.0 05/29/2021 0054   EOSABS 0.1  05/29/2021 0054   BASOSABS 0.0 05/29/2021 0054    BMET    Component Value Date/Time   NA 141 09/09/2021 1719   K 4.4 09/09/2021 1719   CL 99 09/09/2021 1719   CO2 24 09/09/2021 1719   GLUCOSE 100 (H) 09/09/2021 1719   GLUCOSE 112 (H) 05/29/2021 0359   BUN 15 09/09/2021 1719   CREATININE 0.80 09/09/2021 1719   CREATININE 0.83 06/07/2020 1119   CREATININE 1.02 05/16/2016 0937   CALCIUM 9.5 09/09/2021 1719   GFRNONAA >60 05/29/2021 0359   GFRNONAA >60 06/07/2020 1119   GFRAA >60 03/10/2020 0855    BNP    Component Value Date/Time   BNP 79.4 01/22/2021 1634   BNP 43.4 05/16/2016 0937    ProBNP    Component Value Date/Time   PROBNP 69 08/01/2021 0857   PROBNP 60.0 08/10/2014 1052    Specialty Problems       Pulmonary Problems   Pulmonary fibrosis (HCC)   DOE (dyspnea on exertion)   Asthmatic bronchitis with acute exacerbation    No Known Allergies  Immunization History  Administered Date(s) Administered   DTaP / IPV 09/25/2012   Fluad Quad(high Dose 65+) 02/19/2019   Influenza Split 03/27/2014   Influenza, High Dose Seasonal PF 03/27/2014, 01/27/2017, 02/22/2018, 03/12/2021   Influenza-Unspecified 02/24/2017, 03/10/2020   PFIZER Comirnaty(Gray Top)Covid-19 Tri-Sucrose Vaccine 06/27/2019, 07/21/2019   PFIZER(Purple Top)SARS-COV-2 Vaccination 07/11/2019, 07/21/2019, 01/26/2020, 10/26/2020   Pneumococcal Conjugate-13 03/27/2014   Pneumococcal Polysaccharide-23 11/10/2009   Pneumococcal-Unspecified 03/12/2014   Tdap 03/24/2011, 09/25/2012, 05/28/2021   Zoster Recombinat (Shingrix) 09/01/2018, 02/19/2019   Zoster, Live 03/21/2010    Past Medical History:  Diagnosis Date   Adenocarcinoma of cecum ([place]) 06/21/2017   Arthritis    "mid back; hands; knees" (02/24/2015)   Basal cell carcinoma    left shoulder; mid chest; right eyelid (02/24/2015)   CAD (coronary artery disease)    a. 08/2014 Cath/PCI: LM nl, LAD 30p, D1 95 (2.25x12 Resolute Integrity DES), LCX  small, RI 100 (attempted PCI) - branches fill via L->L collats, RCA dominant, nl, RPDA/PLA nl, EF 60^. b. 02/24/2015 PCI CTO of Ramus DES x2.   Chronic bronchitis ([place])    hx   Diverticulosis 05/2005   Elevated lipase    Fuchs' corneal dystrophy    History of adenomatous polyp of colon 05/2005   8 mm adenoma   History of blood transfusion    "  related to some of my surgeries"   Hyperlipidemia    Hypertension    Iron deficiency anemia due to chronic blood loss 06/21/2017   Pneumonia    Prostate cancer ([place]) 2011   S/P seed implant   Sarcoidosis    Thrombocytopenia ([place])    a. Noted on prior labs, unclear of what w/u done.   Trifascicular block     Tobacco History: Social History   Tobacco Use  Smoking Status Light Smoker   Packs/day: 0.00   Years: 59.00   Total pack years: 0.00   Types: Cigars, Cigarettes   Last attempt to quit: 02/24/2015   Years since quitting: 6.7  Smokeless Tobacco Never  Tobacco Comments   02/24/2015 "quit cigarettes ~ 2000 ago but smokes cigars once/month"verified 03/10/21. Once in a while.    Ready to quit: Not Answered Counseling given: Not Answered Tobacco comments: 02/24/2015 "quit cigarettes ~ 2000 ago but smokes cigars once/month"verified 03/10/21. Once in a while.    Continue to not smoke  Outpatient Encounter Medications as of 11/25/2021  Medication Sig   acetaminophen (TYLENOL) 325 MG tablet Take 650 mg by mouth every 6 (six) hours as needed for moderate pain.   albuterol (VENTOLIN HFA) 108 (90 Base) MCG/ACT inhaler Inhale 1-2 puffs into the lungs every 6 (six) hours as needed for wheezing or shortness of breath.   amLODipine (NORVASC) 5 MG tablet Take 1 tablet (5 mg total) by mouth daily.   Cholecalciferol (VITAMIN D3) 2000 units capsule Take 2,000 Units by mouth daily.   clopidogrel (PLAVIX) 75 MG tablet Take 75 mg by mouth daily.   furosemide (LASIX) 20 MG tablet Take 20 mg by mouth.   gabapentin (NEURONTIN) 100 MG capsule Take 200 mg by  mouth daily as needed (neuropathy).   ibuprofen (ADVIL) 100 MG tablet Take 100 mg by mouth every 6 (six) hours as needed for pain.   Multiple Vitamins-Minerals (VITRUM SENIOR) TABS Take 1 tablet by mouth daily.   naproxen sodium (ALEVE) 220 MG tablet Take 440 mg by mouth daily as needed (pain).   nitroGLYCERIN (NITROSTAT) 0.4 MG SL tablet PLACE 1 TABLET UNDER THE TONGUE EVERY 5 MINUTES AS NEEDED FOR CHEST PAIN FOR 3 DOSES (Patient taking differently: Place 0.4 mg under the tongue every 5 (five) minutes as needed.)   ranolazine (RANEXA) 500 MG 12 hr tablet TAKE 1 TABLET BY MOUTH TWICE A DAY   rosuvastatin (CRESTOR) 20 MG tablet TAKE 1 TABLET BY MOUTH EVERY DAY   traMADol (ULTRAM) 50 MG tablet Take 2 tablets (100 mg total) by mouth every 6 (six) hours as needed.   No facility-administered encounter medications on file as of 11/25/2021.     Review of Systems  N/a  Physical Exam  BP 130/64 (BP Location: Left Arm, Patient Position: Sitting, Cuff Size: Normal)   Pulse 84   Temp 98.5 F (36.9 C) (Oral)   Ht '5\' 10"'$  (1.778 m)   Wt 229 lb 9.6 oz (104.1 kg)   SpO2 94%   BMI 32.94 kg/m   Wt Readings from Last 5 Encounters:  11/25/21 229 lb 9.6 oz (104.1 kg)  10/18/21 227 lb 12.8 oz (103.3 kg)  09/09/21 233 lb 12.8 oz (106.1 kg)  08/01/21 239 lb (108.4 kg)  06/27/21 234 lb 6.4 oz (106.3 kg)    BMI Readings from Last 5 Encounters:  11/25/21 32.94 kg/m  10/18/21 32.69 kg/m  09/09/21 33.55 kg/m  08/01/21 34.29 kg/m  06/27/21 33.63 kg/m     Physical  Exam General: Well-appearing, no acute distress Eyes: EOMI, no icterus Neck: No JVD supple Respiratory:L clear to auscultation bilaterally, no wheezes or crackles, diminished in the right base Cardiovascular: Regular rhythm, no murmurs Abdomen: Nondistended, bowel sounds present Psych: Normal mood, full affect   Assessment & Plan:   Recurrent right pleural effusion: Suspect related to local inflammation due to metastasis to  liver, liver resection.  Cytology negative.  Improvement in dyspnea with drainage.  Plan repeat thoracentesis next week, holding Plavix for 5 days, tentative plan Tuesday 6/20.   Sarcoidosis: Burnt out, no evidence of active disease on recent imaging.  Return in about 3 months (around 02/25/2022).   Lanier Clam, MD 11/28/2021

## 2021-11-28 NOTE — Telephone Encounter (Signed)
Please schedule thoracentesis Tuesday, 11/29/2021 at [facility] endoscopy with Dr. Freda Jackson.  Last dose Plavix taken Wednesday evening.

## 2021-11-29 ENCOUNTER — Encounter (HOSPITAL_COMMUNITY)
Admission: RE | Disposition: A | Discharge status: Home / Self Care | Payer: Self-pay | Source: Home / Self Care | Attending: Pulmonary Disease

## 2021-11-29 ENCOUNTER — Ambulatory Visit (HOSPITAL_COMMUNITY): Payer: Medicare PPO

## 2021-11-29 ENCOUNTER — Ambulatory Visit (HOSPITAL_COMMUNITY)
Admission: RE | Admit: 2021-11-29 | Discharge: 2021-11-29 | Disposition: A | Discharge status: Home / Self Care | Payer: Medicare PPO | Attending: Pulmonary Disease | Admitting: Pulmonary Disease

## 2021-11-29 DIAGNOSIS — R091 Pleurisy: Secondary | ICD-10-CM | POA: Diagnosis not present

## 2021-11-29 DIAGNOSIS — F17210 Nicotine dependence, cigarettes, uncomplicated: Secondary | ICD-10-CM | POA: Diagnosis not present

## 2021-11-29 DIAGNOSIS — R846 Abnormal cytological findings in specimens from respiratory organs and thorax: Secondary | ICD-10-CM | POA: Diagnosis not present

## 2021-11-29 DIAGNOSIS — J45909 Unspecified asthma, uncomplicated: Secondary | ICD-10-CM | POA: Insufficient documentation

## 2021-11-29 DIAGNOSIS — D869 Sarcoidosis, unspecified: Secondary | ICD-10-CM | POA: Insufficient documentation

## 2021-11-29 DIAGNOSIS — J90 Pleural effusion, not elsewhere classified: Secondary | ICD-10-CM | POA: Diagnosis not present

## 2021-11-29 HISTORY — PX: THORACENTESIS: SHX235

## 2021-11-29 SURGERY — THORACENTESIS
Anesthesia: LOCAL | Laterality: Right

## 2021-11-29 NOTE — Op Note (Signed)
Thoracentesis  Procedure Note  Luis [name]  [id number]  February 01, 1941  Date:11/29/21  Time:2:31 PM   Provider Performing:Seanna Sisler B Alanys Godino   Procedure: Thoracentesis with imaging guidance (82505)  Indication(s) Pleural Effusion  Consent Risks of the procedure as well as the alternatives and risks of each were explained to the patient and/or caregiver.  Consent for the procedure was obtained and is signed in the bedside chart  Anesthesia Topical only with 1% lidocaine    Time Out Verified patient identification, verified procedure, site/side was marked, verified correct patient position, special equipment/implants available, medications/allergies/relevant history reviewed, required imaging and test results available.   Sterile Technique Maximal sterile technique including full sterile barrier drape, hand hygiene, sterile gown, sterile gloves, mask, hair covering, sterile ultrasound probe cover (if used).  Procedure Description Ultrasound was used to identify appropriate pleural anatomy for placement and overlying skin marked.  Area of drainage cleaned and draped in sterile fashion. Lidocaine was used to anesthetize the skin and subcutaneous tissue.  1500 cc's of straw colored fluid was drained from the right pleural space. Catheter then removed and bandaid applied to site.   Complications/Tolerance None; patient tolerated the procedure well. Chest X-ray is ordered to confirm no post-procedural complication.   EBL Minimal   Specimen(s) Pleural fluid for cytology

## 2021-11-29 NOTE — Interval H&P Note (Signed)
History and Physical Interval Note:  11/29/2021 1:24 PM  Luis [name]  has presented today for surgery, with the diagnosis of pleural effusion.  The various methods of treatment have been discussed with the patient and family. After consideration of risks, benefits and other options for treatment, the patient has consented to  Procedure(s): THORACENTESIS (Right) as a surgical intervention.  The patient's history has been reviewed, patient examined, no change in status, stable for surgery.  I have reviewed the patient's chart and labs.  Questions were answered to the patient's satisfaction.     [name]

## 2021-11-30 ENCOUNTER — Encounter (HOSPITAL_COMMUNITY): Payer: Self-pay | Admitting: Pulmonary Disease

## 2021-11-30 DIAGNOSIS — L299 Pruritus, unspecified: Secondary | ICD-10-CM | POA: Diagnosis not present

## 2021-11-30 DIAGNOSIS — L820 Inflamed seborrheic keratosis: Secondary | ICD-10-CM | POA: Diagnosis not present

## 2021-11-30 DIAGNOSIS — L728 Other follicular cysts of the skin and subcutaneous tissue: Secondary | ICD-10-CM | POA: Diagnosis not present

## 2021-11-30 DIAGNOSIS — L300 Nummular dermatitis: Secondary | ICD-10-CM | POA: Diagnosis not present

## 2021-11-30 LAB — CYTOLOGY - NON PAP

## 2021-12-07 ENCOUNTER — Other Ambulatory Visit: Payer: Self-pay | Admitting: Interventional Cardiology

## 2022-01-02 ENCOUNTER — Other Ambulatory Visit: Payer: Self-pay

## 2022-01-02 DIAGNOSIS — C189 Malignant neoplasm of colon, unspecified: Secondary | ICD-10-CM | POA: Diagnosis not present

## 2022-01-02 DIAGNOSIS — Z923 Personal history of irradiation: Secondary | ICD-10-CM | POA: Diagnosis not present

## 2022-01-02 DIAGNOSIS — Z79899 Other long term (current) drug therapy: Secondary | ICD-10-CM | POA: Diagnosis not present

## 2022-01-02 DIAGNOSIS — C19 Malignant neoplasm of rectosigmoid junction: Secondary | ICD-10-CM | POA: Diagnosis not present

## 2022-01-02 DIAGNOSIS — R5383 Other fatigue: Secondary | ICD-10-CM | POA: Diagnosis not present

## 2022-01-02 DIAGNOSIS — J90 Pleural effusion, not elsewhere classified: Secondary | ICD-10-CM | POA: Diagnosis not present

## 2022-01-02 DIAGNOSIS — C787 Secondary malignant neoplasm of liver and intrahepatic bile duct: Secondary | ICD-10-CM | POA: Diagnosis not present

## 2022-01-10 ENCOUNTER — Other Ambulatory Visit: Payer: Self-pay

## 2022-02-06 ENCOUNTER — Telehealth: Payer: Self-pay | Admitting: Interventional Cardiology

## 2022-02-06 NOTE — Telephone Encounter (Signed)
  STAT if HR is under 50 or over 120 (normal HR is 60-100 beats per minute)  What is your heart rate? 135  Do you have a log of your heart rate readings (document readings)? 69-75 With exertion 120+  Do you have any other symptoms? Pt said, he is concern of his HR, if he do some errand HR elevates to 120+, he also have increased SOB. He made an appt with Christen Bame on 08/30

## 2022-02-06 NOTE — Telephone Encounter (Signed)
Returned call to patient. He states that he has had intermittent episodes of elevated HRs x 2 weeks. He states that his HR is only elevated when he is up moving around. Use to walk 2 miles a day with HR only getting up to 110 bpm, now HR gets up to 145 bpm and takes longer to recover. He states that he has SOB with exertion. He denies chest pain with exertion, palpitations, irregular heart beats, swelling, or any other Sx. HR runs in the 70s when he is not exercising. Denies feeling like he does when his pleural effusion accumulates. Patient is due for 6 month follow and has an appointment with Sharyn Lull on 8/30. Instructed the patient to keep appointment and let us know if his Sx change or worsen. Patient verbalized understanding and thanked me for the call.

## 2022-02-07 DIAGNOSIS — L578 Other skin changes due to chronic exposure to nonionizing radiation: Secondary | ICD-10-CM | POA: Diagnosis not present

## 2022-02-07 DIAGNOSIS — L821 Other seborrheic keratosis: Secondary | ICD-10-CM | POA: Diagnosis not present

## 2022-02-07 DIAGNOSIS — L300 Nummular dermatitis: Secondary | ICD-10-CM | POA: Diagnosis not present

## 2022-02-07 DIAGNOSIS — L570 Actinic keratosis: Secondary | ICD-10-CM | POA: Diagnosis not present

## 2022-02-07 DIAGNOSIS — D225 Melanocytic nevi of trunk: Secondary | ICD-10-CM | POA: Diagnosis not present

## 2022-02-07 NOTE — Progress Notes (Unsigned)
Cardiology Office Note:    Date:  02/08/2022   ID:  DEKLEN POPELKA, DOB [birth year], MRN [id number]  PCP:  [name], MD   [facility] Providers Cardiologist:  Larae Grooms, MD     Referring MD: [name], MD   Chief Complaint: fast heart rate   History of Present Illness:    Luis [name] is a pleasant 81 y.o. male with a hx of CAD, HTN, HLD, carotid stenosis, and adenocarcinoma of colon.   History of CAD to diagonal 08/2014 with ramus intermedius totally occluded at the time.  He eventually underwent CTO PCI with overlapping DES x2 to the ramus 02/2015. Chest pain 05/2016 relieved with ntg, off Ranexa NST 12/2017 normal study with normal LVEF.  Found to have totally occluded right carotid on Dopplers 04/2018 but no other stenosis.  Adenocarcinoma of the colon s/p colectomy.  He underwent cardiac catheterization 01/02/2023 DOE and left arm pain relieved with rest.  Was walking 3 miles daily but had to cut to 1-1/2 miles because of shortness of breath, no chest tightness.  Had gained 10 to 12 pounds.  Received DES to LAD and balloon angioplasty to the diagonal bifurcation.  Plan for aspirin and Plavix for at least 6 months.  [place] - chemotherapy for adenocarcinoma of colon with liver metastasis then surgery once tumor shrunk.  He decided not to have surgery so he was treated with chemotherapy and will have an ablation of the tumor on the liver. Had an episode of chest pain, seen by Dr. Martinique at hospital and troponin was negative, EKG unchanged. Consideration of repeat cath is symptoms persist. Negative work-up in ED 01/2021, hgb was 9.6.  Noted at [facility] that he had used additional diuretics for pleural effusions and shortness of breath.  CXR on 01/22/2021 showed significant improvement in right-sided pleural effusion.   He was last seen in our office by Dr.Varanasi on 08/01/2021 at which time DOE persisted and consideration was given to further ischemic  evaluation. He underwent thoracentesis 08/16/21 with [facility].   He called our office 02/06/2022 with intermittent episodes of elevated HR x2 weeks.  HR previously 110 bpm with walking now up to 145 and taking him longer to recover.  Also notes DOE.  No chest pain.  He was advised to keep his 64-monthfollow-up appointment scheduled for 8/30 and call back if symptoms worsen.  Today, he is here for evaluation of elevated HR. These episodes started prior to starting prednisone which he started for persistent rash today. Reports DOE, feeling tired, and wakes up in the morning with chest pain that resolves after a few minutes.  Chest pain occurs at other times as well, however by the time he thinks about it it is gone.  It is not specifically associated with worsening DOE.  DOE most severe when walking up inclines.  Also condones funny feeling in his chest that is difficult to describe. Has persistent pleural effusion for which he has had total of 3 thoracentesis. Feels like there is fluid in lungs, but it is not limited his breathing. He calls pulmonology when he gets to a certain point where he cannot breath. HR ranging from low 60s to 150s on smart watch. Later in our discussion of potential causes of fluctuating HR, he reports that a few weeks ago he noticed coffee was dripping out of the right side of his mouth with a slight droop noted that resolved on his own.  Past Medical History:  Diagnosis Date   Adenocarcinoma of cecum ([place]) 06/21/2017   Arthritis    "mid back; hands; knees" (02/24/2015)   Basal cell carcinoma    left shoulder; mid chest; right eyelid (02/24/2015)   CAD (coronary artery disease)    a. 08/2014 Cath/PCI: LM nl, LAD 30p, D1 95 (2.25x12 Resolute Integrity DES), LCX small, RI 100 (attempted PCI) - branches fill via L->L collats, RCA dominant, nl, RPDA/PLA nl, EF 60^. b. 02/24/2015 PCI CTO of Ramus DES x2.   Chronic bronchitis ([place])    hx   Diverticulosis 05/2005   Elevated  lipase    Fuchs' corneal dystrophy    History of adenomatous polyp of colon 05/2005   8 mm adenoma   History of blood transfusion    "related to some of my surgeries"   Hyperlipidemia    Hypertension    Iron deficiency anemia due to chronic blood loss 06/21/2017   Pneumonia    Prostate cancer ([place]) 2011   S/P seed implant   Sarcoidosis    Thrombocytopenia ([place])    a. Noted on prior labs, unclear of what w/u done.   Trifascicular block     Past Surgical History:  Procedure Laterality Date   BASAL CELL CARCINOMA EXCISION Left    shoulder   CARDIAC CATHETERIZATION N/A 02/24/2015   Procedure: Coronary/Bypass Graft CTO Intervention;  Surgeon: Jettie Booze, MD;  Location: [place] [facility];  Service: Cardiovascular;  Laterality: N/A;   CARDIAC CATHETERIZATION  02/24/2015   Procedure: Coronary/Graft Atherectomy;  Surgeon: Jettie Booze, MD;  Location: [place] [facility];  Service: Cardiovascular;;   CATARACT EXTRACTION W/ INTRAOCULAR LENS  IMPLANT, BILATERAL Bilateral ~ 2011-2012   COLONOSCOPY W/ POLYPECTOMY  06/08/2005 and 10/18/2010   8 mm adenoma 2006, none 2012. Diverticulosis and internal hemorrhoids.   CORNEAL TRANSPLANT Bilateral ~ 2011-2012   "@ same time as cataract OR"   CORONARY ANGIOPLASTY WITH STENT PLACEMENT  08/2014; 02/24/2015   "1 stent + 1 stent"   CORONARY STENT INTERVENTION N/A 01/02/2019   Procedure: CORONARY STENT INTERVENTION;  Surgeon: Jettie Booze, MD;  Location: [place] [facility];  Service: Cardiovascular;  Laterality: N/A;   EYE SURGERY     FINGER SURGERY Right 2014   "reattached middle finger"   INSERTION PROSTATE RADIATION SEED  ~ 2011   IR IMAGING GUIDED PORT INSERTION  03/04/2019   IR RADIOLOGIST EVAL & MGMT  05/26/2020   IR US GUIDE BX ASP/DRAIN  03/04/2019   LAPAROSCOPIC CHOLECYSTECTOMY  2015   LAPAROSCOPIC PARTIAL COLECTOMY N/A 07/26/2017   Procedure: LAPAROSCOPIC PARTIAL COLECTOMY, EXCISION SCROTAL CYST;  Surgeon: Greer Pickerel, MD;   Location: [facility];  Service: General;  Laterality: N/A;   LEFT HEART CATH AND CORONARY ANGIOGRAPHY N/A 01/02/2019   Procedure: LEFT HEART CATH AND CORONARY ANGIOGRAPHY;  Surgeon: Jettie Booze, MD;  Location: [place] [facility];  Service: Cardiovascular;  Laterality: N/A;   LEFT HEART CATHETERIZATION WITH CORONARY ANGIOGRAM N/A 08/12/2014   Procedure: LEFT HEART CATHETERIZATION WITH CORONARY ANGIOGRAM;  Surgeon: Blane Ohara, MD;  [facility];  Service: Cardiovascular;  Laterality: N/A;   LYMPH NODE BIOPSY     mid chest   MOHS SURGERY Right ~ 2011   "eyelid; for basal cell"   RADIOLOGY WITH ANESTHESIA N/A 06/30/2020   Procedure: IR WITH ANESTHESIA MICROWAVE ABLATION;  Surgeon: Corrie Mckusick, DO;  Location: [facility];  Service: Anesthesiology;  Laterality: N/A;   THORACENTESIS N/A 02/11/2021  Procedure: THORACENTESIS;  Surgeon: Lanier Clam, MD;  Location: [facility] ENDOSCOPY;  Service: Pulmonary;  Laterality: N/A;   THORACENTESIS Right 04/04/2021   Procedure: THORACENTESIS;  Surgeon: Candee Furbish, MD;  Location: [facility] ENDOSCOPY;  Service: Pulmonary;  Laterality: Right;   THORACENTESIS Right 08/16/2021   Procedure: THORACENTESIS;  Surgeon: Jacky Kindle, MD;  Location: [facility] ENDOSCOPY;  Service: Pulmonary;  Laterality: Right;   THORACENTESIS Right 11/29/2021   Procedure: THORACENTESIS;  Surgeon: [name], MD;  Location: [facility] ENDOSCOPY;  Service: Pulmonary;  Laterality: Right;   THOROCOTOMY WITH LOBECTOMY Right ~ 1991   partial removal right lung   TONSILLECTOMY  ~ 1950    Current Medications: Current Meds  Medication Sig   acetaminophen (TYLENOL) 325 MG tablet Take 650 mg by mouth every 6 (six) hours as needed for moderate pain.   albuterol (VENTOLIN HFA) 108 (90 Base) MCG/ACT inhaler Inhale 1-2 puffs into the lungs every 6 (six) hours as needed for wheezing or shortness of breath.   amLODipine (NORVASC) 5 MG tablet Take 1 tablet (5 mg total) by mouth daily.    Cholecalciferol (VITAMIN D3) 2000 units capsule Take 2,000 Units by mouth daily.   clobetasol (TEMOVATE) 0.05 % external solution Apply 1 Application topically 2 (two) times daily.   clopidogrel (PLAVIX) 75 MG tablet Take 75 mg by mouth daily.   fluticasone (FLONASE) 50 MCG/ACT nasal spray Place 2 sprays into the nose daily.   furosemide (LASIX) 20 MG tablet Take 20 mg by mouth daily.   gabapentin (NEURONTIN) 100 MG capsule Take 200 mg by mouth at bedtime as needed (neuropathy).   ibuprofen (ADVIL) 200 MG tablet Take 400 mg by mouth every 6 (six) hours as needed for pain.   Multiple Vitamins-Minerals (VITRUM SENIOR) TABS Take 1 tablet by mouth daily.   naproxen sodium (ALEVE) 220 MG tablet Take 440 mg by mouth daily as needed (pain).   nitroGLYCERIN (NITROSTAT) 0.4 MG SL tablet PLACE 1 TABLET UNDER THE TONGUE EVERY 5 MINUTES AS NEEDED FOR CHEST PAIN FOR 3 DOSES (Patient taking differently: Place 0.4 mg under the tongue every 5 (five) minutes as needed.)   ranolazine (RANEXA) 500 MG 12 hr tablet TAKE 1 TABLET BY MOUTH TWICE A DAY   rosuvastatin (CRESTOR) 20 MG tablet Take 1 tablet (20 mg total) by mouth daily. Please call [phone] to schedule an appointment for future refills. Thank you.   traMADol (ULTRAM) 50 MG tablet Take 2 tablets (100 mg total) by mouth every 6 (six) hours as needed.     Allergies:   Patient has no known allergies.   Social History   Socioeconomic History   Marital status: Married    Spouse name: Not on file   Number of children: 2   Years of education: Not on file   Highest education level: Not on file  Occupational History   Occupation: Unemployed  Tobacco Use   Smoking status: Light Smoker    Packs/day: 0.00    Years: 59.00    Total pack years: 0.00    Types: Cigars, Cigarettes    Last attempt to quit: 02/24/2015    Years since quitting: 6.9   Smokeless tobacco: Never   Tobacco comments:    02/24/2015 "quit cigarettes ~ 2000 ago but smokes cigars  once/month"verified 03/10/21. Once in a while.   Vaping Use   Vaping Use: Never used  Substance and Sexual Activity   Alcohol use: Yes    Alcohol/week: 2.0 standard drinks of alcohol  Types: 1 Glasses of wine, 1 Standard drinks or equivalent per week    Comment: Drinks maybe 1-2 drinks a week or less   Drug use: No   Sexual activity: Not on file  Other Topics Concern   Not on file  Social History Narrative   Lives at home wife.     Social Determinants of Health   Financial Resource Strain: Not on file  Food Insecurity: Not on file  Transportation Needs: Not on file  Physical Activity: Not on file  Stress: Not on file  Social Connections: Not on file     Family History: The patient's family history includes Coronary artery disease (age of onset: 63) in his sister; Heart attack in his sister; Heart disease (age of onset: 52) in his father. There is no history of Colon cancer, Throat cancer, Pancreatic cancer, Prostate cancer, Stroke, or Hypertension.  ROS:   Please see the history of present illness.    + chest pain + DOE + Fatigue + elevated HR All other systems reviewed and are negative.  Labs/Other Studies Reviewed:    The following studies were reviewed today:  Echo 05/29/21  1. Left ventricular ejection fraction, by estimation, is 60 to 65%. The  left ventricle has normal function. The left ventricle has no regional  wall motion abnormalities. There is mild concentric left ventricular  hypertrophy. Left ventricular diastolic  parameters are consistent with Grade I diastolic dysfunction (impaired  relaxation).   2. Right ventricular systolic function is normal. The right ventricular  size is normal.   3. The mitral valve is normal in structure. No evidence of mitral valve  regurgitation. No evidence of mitral stenosis.   4. The aortic valve is tricuspid. There is mild calcification of the  aortic valve. There is mild thickening of the aortic valve. Aortic valve   regurgitation is not visualized. Aortic valve sclerosis is present, with  no evidence of aortic valve stenosis.   5. There is mild dilatation of the ascending aorta, measuring 40 mm.   6. The inferior vena cava is normal in size with greater than 50%  respiratory variability, suggesting right atrial pressure of 3 mmHg.   Carotid Duplex 05/18/21  Comparison(s): No significant change from prior study. Prior images  reviewed side by side.   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   Left Carotid: Velocities in the left ICA are consistent with a 1-39%  stenosis.   Vertebrals: Bilateral vertebral arteries demonstrate antegrade flow.  Subclavians: Normal flow hemodynamics were seen in bilateral subclavian               arteries.   *See table(s) above for measurements and observations.  Suggest follow up study in 12 months.   LHC 01/02/19  Patent diagonal stent. Previously placed Ramus drug eluting stent is widely patent. Prox LAD lesion is 75% stenosed. A drug-eluting stent was successfully placed using a STENT SYNERGY DES 3X24. Post intervention, there is a 0% residual stenosis. 1st Diag-1 lesion is 75% stenosed. Balloon angioplasty was performed using a BALLOON SAPPHIRE 2.0X12.[name] Post intervention, there is a 40% residual stenosis. The left ventricular systolic function is normal. LV end diastolic pressure is mildly elevated. LVEDP 15 mm Hg. The left ventricular ejection fraction is 55-65% by visual estimate. There is no aortic valve stenosis.   Successful PCI of the LAD diagonal bifurcation.  Continue medical therapy as well.    Possible same day PCI candidate.   Diagnostic Dominance: Right  Intervention      Recent Labs: 05/29/2021: ALT 33 08/01/2021: Hemoglobin 12.7; NT-Pro BNP 69; Platelets 171 09/09/2021: BUN 15; Creatinine, Ser 0.80; Potassium 4.4; Sodium 141  Recent Lipid Panel    Component Value Date/Time   CHOL 155 10/13/2015 1005   TRIG 186  (H) 10/13/2015 1005   HDL 49 10/13/2015 1005   CHOLHDL 3.2 10/13/2015 1005   VLDL 37 (H) 10/13/2015 1005   LDLCALC 69 10/13/2015 1005     Risk Assessment/Calculations:           Physical Exam:    VS:  BP 128/68   Pulse 80   Ht '5\' 10"'$  (1.778 m)   Wt 229 lb 12.8 oz (104.2 kg)   SpO2 99%   BMI 32.97 kg/m     Wt Readings from Last 3 Encounters:  02/08/22 229 lb 12.8 oz (104.2 kg)  11/25/21 229 lb 9.6 oz (104.1 kg)  10/18/21 227 lb 12.8 oz (103.3 kg)     GEN:  Well nourished, well developed in no acute distress HEENT: Normal NECK: No JVD; No carotid bruits CARDIAC: RRR, no murmurs, rubs, gallops RESPIRATORY:  Diminished breath sound right side, clear to auscultation on left without rales, wheezing or rhonchi  ABDOMEN: Soft, non-tender, non-distended MUSCULOSKELETAL:  No edema; No deformity. 2+ pedal pulses, equal bilaterally SKIN: Warm and dry NEUROLOGIC:  Alert and oriented x 3 PSYCHIATRIC:  Normal affect   EKG:  EKG is ordered today.  The ekg ordered today demonstrates sinus rhythm with 1st degree AV block at 80 bpm with PR interval 256 mswith PACs, RBBB, LAFB, no ST abnormality    Diagnoses:    1. Coronary artery disease involving native coronary artery of native heart with other form of angina pectoris ([place])   2. Hyperlipidemia LDL goal <70   3. Essential hypertension   4. Palpitations   5. Atherosclerosis of both carotid arteries   6. DOE (dyspnea on exertion)    Assessment and Plan:     Elevated HR/Palpitations: HR elevated over the last few weeks ranging from 60s to 150s on smart watch. Notes a "funny feeling" in his chest. No presyncope, syncope. Symptoms concerning for a fib. We will get a 14 day Zio to evaluate for arrhythmia.  Briefly discussed implications of finding atrial fibrillation including the need for anticoagulation. Would consider initiation of BB or CCB following monitoring period if HR remains elevated.   CAD with ?angina: History of DES to  diagonal vessel 2016.  CT of PCI with overlapping DES x2 to ramus 2016. Successful PCI of LAD diagonal bifurcation, balloon angioplasty first diagonal lesion, patent stents 12/2018. Occasional episodes of chest pain occurring more frequently recently. He is concerned for worsening angina. CP is not severe, however it is occurring frequently. Also has DOE and fatigue which were previous indications of coronary stenosis.  We will get a [place] for evaluation of ischemia. Continue GDMT including Plavix, Ranexa, Crestor, amlodipine.   DOE: History of DOE and previous thoracentesis due to pleural effusions.  He monitors this and reports worsening breathing concerns to pulmonologist.  He does not feel like he is at the point of needing thoracentesis at this time. As noted above, symptoms may be associated with arrhythmia or CAD. No indication of volume overload on exam. He denies PND, edema, orthopnea.   Carotid artery disease: Reports an occasional pain in his neck.  Reviewed carotid results from 05/2021 which showed no significant stenosis.  Discussed that pain may be MSK.  He will continue to monitor.  We will schedule repeat carotid u/s for 05/2022 as advised by primary cardiologist.   Hypertension: BP is well-controlled. No medication changes today.   Hyperlipidemia LDL goal < 70: No recent LDL for review. We did not discuss specifically at this visit. Favor discussion and possible reevaluation of LDL level at next visit.   Shared Decision Making/Informed Consent The risks [chest pain, shortness of breath, cardiac arrhythmias, dizziness, blood pressure fluctuations, myocardial infarction, stroke/transient ischemic attack, nausea, vomiting, allergic reaction, radiation exposure, metallic taste sensation and life-threatening complications (estimated to be 1 in 10,000)], benefits (risk stratification, diagnosing coronary artery disease, treatment guidance) and alternatives of a nuclear stress test  were discussed in detail with Mr. Goeden and he agrees to proceed.   Disposition: 3 months with Dr. Irish Lack or APP  Medication Adjustments/Labs and Tests Ordered: Current medicines are reviewed at length with the patient today.  Concerns regarding medicines are outlined above.  Orders Placed This Encounter  Procedures   Myocardial Perfusion Imaging   LONG TERM MONITOR (3-14 DAYS)   EKG 12-Lead   VAS US CAROTID   No orders of the defined types were placed in this encounter.   There are no Patient Instructions on file for this visit.   Signed, Emmaline Life, NP  02/08/2022 8:35 AM    [place]

## 2022-02-08 ENCOUNTER — Ambulatory Visit (INDEPENDENT_AMBULATORY_CARE_PROVIDER_SITE_OTHER): Payer: Medicare PPO

## 2022-02-08 ENCOUNTER — Encounter: Payer: Self-pay | Admitting: Nurse Practitioner

## 2022-02-08 ENCOUNTER — Ambulatory Visit: Payer: Medicare PPO | Attending: Nurse Practitioner | Admitting: Nurse Practitioner

## 2022-02-08 ENCOUNTER — Telehealth (HOSPITAL_COMMUNITY): Payer: Self-pay | Admitting: *Deleted

## 2022-02-08 VITALS — BP 128/68 | HR 80 | Ht 70.0 in | Wt 229.8 lb

## 2022-02-08 DIAGNOSIS — I6523 Occlusion and stenosis of bilateral carotid arteries: Secondary | ICD-10-CM

## 2022-02-08 DIAGNOSIS — I10 Essential (primary) hypertension: Secondary | ICD-10-CM

## 2022-02-08 DIAGNOSIS — R002 Palpitations: Secondary | ICD-10-CM

## 2022-02-08 DIAGNOSIS — E785 Hyperlipidemia, unspecified: Secondary | ICD-10-CM

## 2022-02-08 DIAGNOSIS — I25118 Atherosclerotic heart disease of native coronary artery with other forms of angina pectoris: Secondary | ICD-10-CM

## 2022-02-08 DIAGNOSIS — R0609 Other forms of dyspnea: Secondary | ICD-10-CM

## 2022-02-08 NOTE — Telephone Encounter (Signed)
Patient given detailed instructions per Myocardial Perfusion Study Information Sheet for the test on 02/10/2022 at 10:00. Patient notified to arrive 15 minutes early and that it is imperative to arrive on time for appointment to keep from having the test rescheduled.  If you need to cancel or reschedule your appointment, please call the office within 24 hours of your appointment. . Patient verbalized understanding.Luis [name]

## 2022-02-08 NOTE — Patient Instructions (Signed)
Medication Instructions:   Your physician recommends that you continue on your current medications as directed. Please refer to the Current Medication list given to you today.   *If you need a refill on your cardiac medications before your next appointment, please call your pharmacy*   Lab Work:  None ordered.  If you have labs (blood work) drawn today and your tests are completely normal, you will receive your results only by: [place] (if you have MyChart) OR A paper copy in the mail If you have any lab test that is abnormal or we need to change your treatment, we will call you to review the results.   Testing/Procedures:  You are scheduled for a Myocardial Perfusion Imaging Study on Friday, September 1 at 10:00 am.   Please arrive 15 minutes prior to your appointment time for registration and insurance purposes.   The test will take approximately 3 to 4 hours to complete; you may bring reading material. If someone comes with you to your appointment, they will need to remain in the main lobby due to limited space in the testing area.   How to prepare for your Myocardial Perfusion test:   Do not eat or drink 3 hours prior to your test, except you may have water.    Do not consume products containing caffeine (regular or decaffeinated) 12 hours prior to your test (ex: coffee, chocolate, soda, tea)   Do bring a list of your current medications with you. If not listed below, you may take your medications as normal.   Bring any held medication to your appointment, as you may be required to take it once the test is complete.   Do wear comfortable clothes (no overalls) and walking shoes. Tennis shoes are preferred. No open toed shoes.  Do not wear cologne, aftershave or lotions (deodorant is allowed).   If these instructions are not followed, you test will have to be rescheduled.   Please report to [address] for your test. If you have questions or  concerns about your appointment, please call the Nuclear Lab at [phone].  If you cannot keep your appointment, please provide 24 hour notification to the Nuclear lab to avoid a possible $50 charge to your account.     ZIO XT- Long Term Monitor Instructions  Your physician has requested you wear a ZIO patch monitor for 14 days.  This is a single patch monitor. [employer] supplies one patch monitor per enrollment. Additional stickers are not available. Please do not apply patch if you will be having a Nuclear Stress Test,  Echocardiogram, Cardiac CT, MRI, or Chest Xray during the period you would be wearing the  monitor. The patch cannot be worn during these tests. You cannot remove and re-apply the  ZIO XT patch monitor.  Your ZIO patch monitor will be mailed 3 day USPS to your address on file. It may take 3-5 days  to receive your monitor after you have been enrolled.  Once you have received your monitor, please review the enclosed instructions. Your monitor  has already been registered assigning a specific monitor serial # to you.  Billing and Patient Assistance Program Information  We have supplied [employer] with any of your insurance information on file for billing purposes. [employer] offers a sliding scale Patient Assistance Program for patients that do not have  insurance, or whose insurance does not completely cover the cost of the ZIO monitor.  You must apply for the Patient Assistance  Program to qualify for this discounted rate.  To apply, please call [employer] at [phone], select option 4, select option 2, ask to apply for  Patient Assistance Program. Theodore Demark will ask your household income, and how many people  are in your household. They will quote your out-of-pocket cost based on that information.  [employer] will also be able to set up a 80-month interest-free payment plan if needed.  Applying the monitor   Shave hair from upper left chest.  Hold abrader disc by orange tab.  Rub abrader in 40 strokes over the upper left chest as  indicated in your monitor instructions.  Clean area with 4 enclosed alcohol pads. Let dry.  Apply patch as indicated in monitor instructions. Patch will be placed under collarbone on left  side of chest with arrow pointing upward.  Rub patch adhesive wings for 2 minutes. Remove white label marked "1". Remove the white  label marked "2". Rub patch adhesive wings for 2 additional minutes.  While looking in a mirror, press and release button in center of patch. A small green light will  flash 3-4 times. This will be your only indicator that the monitor has been turned on.  Do not shower for the first 24 hours. You may shower after the first 24 hours.  Press the button if you feel a symptom. You will hear a small click. Record Date, Time and  Symptom in the Patient Logbook.  When you are ready to remove the patch, follow instructions on the last 2 pages of Patient  Logbook. Stick patch monitor onto the last page of Patient Logbook.  Place Patient Logbook in the blue and white box. Use locking tab on box and tape box closed  securely. The blue and white box has prepaid postage on it. Please place it in the mailbox as  soon as possible. Your physician should have your test results approximately 7 days after the  [facility] has [facility]  Call [place]at [phone] you have questions regarding  your ZIO XT patch monitor. Call them immediately if you see an orange light blinking on your  monitor.  If your monitor falls off in less than 4 days, contact our Monitor department at [phone]  If your monitor becomes loose or falls off after 4 days call [employer] at [phone]  suggestions on securing your monitor  Your physician has requested that you have a carotid duplex. This test is an ultrasound of the carotid arteries in your neck. It looks at blood flow through these arteries that  supply the brain with blood. Allow one hour for this exam. There are no restrictions or special instructions.   Follow-Up: [facility] and your [facility] needs are our [facility].  As part of our continuing mission to provide you with exceptional heart care, we have created designated Provider Care Teams.  These Care Teams include your primary Cardiologist (physician) and Advanced Practice Providers (APPs -  Physician Assistants and Nurse Practitioners) who all work together to provide you with the care you need, when you need it.  We recommend signing up for the patient portal called "MyChart".  Sign up information is provided on this After Visit Summary.  MyChart is used to connect with patients for Virtual Visits (Telemedicine).  Patients are able to view lab/test results, encounter notes, upcoming appointments, etc.  Non-urgent messages can be sent to your provider as well.   To learn more about  what you can do with MyChart, go to [email or web address].    Your next appointment:   5 month(s)  The format for your next appointment:   In Person  Provider:   Larae Grooms, MD      Important Information About Sugar

## 2022-02-08 NOTE — Progress Notes (Unsigned)
[id number] ZIO XT from office inventory applied to patient.  Dr. Irish Lack to read.

## 2022-02-10 ENCOUNTER — Ambulatory Visit (HOSPITAL_COMMUNITY): Payer: Medicare PPO | Attending: Internal Medicine

## 2022-02-10 DIAGNOSIS — R002 Palpitations: Secondary | ICD-10-CM

## 2022-02-10 DIAGNOSIS — I10 Essential (primary) hypertension: Secondary | ICD-10-CM | POA: Diagnosis not present

## 2022-02-10 DIAGNOSIS — E785 Hyperlipidemia, unspecified: Secondary | ICD-10-CM | POA: Diagnosis not present

## 2022-02-10 DIAGNOSIS — I25118 Atherosclerotic heart disease of native coronary artery with other forms of angina pectoris: Secondary | ICD-10-CM | POA: Insufficient documentation

## 2022-02-10 LAB — MYOCARDIAL PERFUSION IMAGING
LV dias vol: 93 mL (ref 62–150)
LV sys vol: 35 mL
Nuc Stress EF: 63 %
Peak HR: 87 {beats}/min
Rest HR: 72 {beats}/min
Rest Nuclear Isotope Dose: 10.5 mCi
SDS: 2
SRS: 0
SSS: 2
ST Depression (mm): 0 mm
Stress Nuclear Isotope Dose: 32.9 mCi
TID: 0.96

## 2022-02-10 MED ORDER — TECHNETIUM TC 99M TETROFOSMIN IV KIT
10.5000 | PACK | Freq: Once | INTRAVENOUS | Status: AC | PRN
  Administered 2022-02-10: 10.5 via INTRAVENOUS

## 2022-02-10 MED ORDER — TECHNETIUM TC 99M TETROFOSMIN IV KIT
32.9000 | PACK | Freq: Once | INTRAVENOUS | Status: AC | PRN
  Administered 2022-02-10: 32.9 via INTRAVENOUS

## 2022-02-10 MED ORDER — REGADENOSON 0.4 MG/5ML IV SOLN
0.4000 mg | Freq: Once | INTRAVENOUS | Status: AC
  Administered 2022-02-10: 0.4 mg via INTRAVENOUS

## 2022-02-11 DIAGNOSIS — S61011A Laceration without foreign body of right thumb without damage to nail, initial encounter: Secondary | ICD-10-CM | POA: Diagnosis not present

## 2022-02-22 DIAGNOSIS — T8130XA Disruption of wound, unspecified, initial encounter: Secondary | ICD-10-CM | POA: Diagnosis not present

## 2022-02-22 DIAGNOSIS — Z4802 Encounter for removal of sutures: Secondary | ICD-10-CM | POA: Diagnosis not present

## 2022-02-22 DIAGNOSIS — S61012A Laceration without foreign body of left thumb without damage to nail, initial encounter: Secondary | ICD-10-CM | POA: Diagnosis not present

## 2022-02-24 ENCOUNTER — Ambulatory Visit: Payer: Medicare PPO | Admitting: Pulmonary Disease

## 2022-02-27 ENCOUNTER — Inpatient Hospital Stay: Payer: Medicare PPO | Admitting: Oncology

## 2022-02-27 DIAGNOSIS — C189 Malignant neoplasm of colon, unspecified: Secondary | ICD-10-CM | POA: Diagnosis not present

## 2022-02-27 DIAGNOSIS — C787 Secondary malignant neoplasm of liver and intrahepatic bile duct: Secondary | ICD-10-CM | POA: Diagnosis not present

## 2022-02-27 DIAGNOSIS — C182 Malignant neoplasm of ascending colon: Secondary | ICD-10-CM | POA: Diagnosis not present

## 2022-03-06 ENCOUNTER — Other Ambulatory Visit: Payer: Self-pay

## 2022-03-06 MED ORDER — FUROSEMIDE 20 MG PO TABS
20.0000 mg | ORAL_TABLET | Freq: Every day | ORAL | 3 refills | Status: DC
Start: 2022-03-06 — End: 2022-04-18

## 2022-03-06 NOTE — Telephone Encounter (Signed)
Pt's medication was sent to pt's pharmacy as requested. Confirmation received.  °

## 2022-03-10 ENCOUNTER — Encounter: Payer: Self-pay | Admitting: Licensed Clinical Social Worker

## 2022-03-10 ENCOUNTER — Other Ambulatory Visit: Payer: Self-pay | Admitting: Interventional Cardiology

## 2022-03-10 ENCOUNTER — Ambulatory Visit: Payer: Self-pay | Admitting: Licensed Clinical Social Worker

## 2022-03-10 NOTE — Patient Outreach (Signed)
  Care Coordination   Initial Visit Note   03/10/2022 Name: Luis [name] MRN: [id number] DOB: 1941-05-14  Luis [name] is a 81 y.o. year old male who sees Hamrick, Lorin Mercy, MD for primary care. I spoke with  Luis [name] by phone today.  What matters to the patients health and wellness today?  Patient declined services    Goals Addressed               This Visit's Progress     Care Coordination Activities- Patient declined services (pt-stated)        Care Coordination Interventions: Provided education to patient re: [name] Discussed plans with patient for ongoing care management follow up and provided patient with direct contact information for care management team Assessed social determinant of health barriers  Patient declined needing any services at this time. Patient declined needing to speak with a RNCM.        SDOH assessments and interventions completed:  Yes     Care Coordination Interventions Activated:  Yes  Care Coordination Interventions:  Yes, provided   Follow up plan: No further intervention required.   Encounter Outcome:  Pt. Visit Completed

## 2022-03-10 NOTE — Patient Instructions (Signed)
Visit Information  Thank you for taking time to visit with me today. Please don't hesitate to contact me if I can be of assistance to you.   Following are the goals we discussed today:   Goals Addressed               This Visit's Progress     Care Coordination Activities- Patient declined services (pt-stated)        Care Coordination Interventions: Provided education to patient re: [name] Discussed plans with patient for ongoing care management follow up and provided patient with direct contact information for care management team Assessed social determinant of health barriers  Patient declined needing any services at this time. Patient declined needing to speak with a RNCM.         Patient verbalizes understanding of instructions and care plan provided today and agrees to view in [place]. Active MyChart status and patient understanding of how to access instructions and care plan via MyChart confirmed with patient.     No further follow up required: .  Lenor Derrick , MSW Social Worker [facility] Care Management  [phone]

## 2022-03-14 ENCOUNTER — Ambulatory Visit: Payer: Medicare PPO | Admitting: Pulmonary Disease

## 2022-03-14 ENCOUNTER — Encounter: Payer: Self-pay | Admitting: Pulmonary Disease

## 2022-03-14 VITALS — BP 130/68 | HR 79 | Ht 70.0 in | Wt 233.2 lb

## 2022-03-14 DIAGNOSIS — J90 Pleural effusion, not elsewhere classified: Secondary | ICD-10-CM | POA: Diagnosis not present

## 2022-03-14 DIAGNOSIS — R0609 Other forms of dyspnea: Secondary | ICD-10-CM

## 2022-03-14 DIAGNOSIS — Z23 Encounter for immunization: Secondary | ICD-10-CM

## 2022-03-14 MED ORDER — CETIRIZINE HCL 10 MG PO TABS: 10.0000 mg | ORAL_TABLET | Freq: Every day | ORAL | 3 refills | Status: DC

## 2022-03-14 MED ORDER — METOPROLOL SUCCINATE ER 50 MG PO TB24: 50.0000 mg | ORAL_TABLET | Freq: Every evening | ORAL | 2 refills | Status: DC

## 2022-03-14 MED ORDER — FLUTICASONE PROPIONATE 50 MCG/ACT NA SUSP: 2.0000 | Freq: Every day | NASAL | 3 refills | Status: DC

## 2022-03-14 NOTE — Patient Instructions (Addendum)
I am going to message about getting you scheduled Friday for thoracentesis.  For the congestion - Take Zyrtec (cetirizine) at night  Use the flonase twice a day for 7 days then go back down to once a day.  Return to clinic in 3 months or sooner as needed

## 2022-03-14 NOTE — H&P (View-Only) (Signed)
$'@Patient'l$  ID: Luis [name], male    DOB: 03/04/1941, 81 y.o.   MRN: [id number]  Chief Complaint  Patient presents with   Follow-up    Pt is here for follow up for DOE. Pt had last thoro done 6/20. Pt states he feels like he is needing another thoro done. Pt states he has been getting increased SOB even getting dressed recently.     Referring provider: [name], MD  HPI:   81 y.o. man whom we are seeing in follow-up of multiple pulmonary issues including burnt out sarcoid, asthma, ongoing chronic of right pleural effusion.  Most recent oncology note from [facility] reviewed.  Doing well. Serial scans at [facility] or oncologic surveillance show accumulation of pleural effusion on my review, most recent scan 12/2021.  More symptomatic in terms of dyspnea last week or so.  Feels like fluid is returned.  Has nagging cough.  Congestion.  Feels like draining from the back of his throat accumulating in the morning.  Coughs up phlegm and better throughout the day.  Has taken Benadryl a couple nights with some improvement.  Continues Flonase once a day.   HPI at initial visit: Overall, disorders are largely unchanged.  Formally followed with [name] for sarcoidosis.  This has been quiescent for some time.  Most recent CT scan reviewed which reveals on my interpretation stable bilateral fibrosis, scattered calcified granulomas in the lung as well as significant calcified hilar and mediastinal lymphadenopathy and sequela of prior lung surgery.  He has been seen in clinic recently by [name].  Primary complaint was dyspnea on exertion as well as cough.  These persist.  Chest imaging obtained as above.  PFTs obtained last week which reveal suggestion of mild to moderate restriction on spirometry without significant bronchodilator response.  Moderate restriction confirmed with total lung capacity 69% of predicted.  DLCO corrects to within normal limits with adjustment for hemoglobin.  Cough and  shortness of breath coincide with worsening nasal congestion.  Postnasal drip.  Not really using meds to address this this.  He is tolerating chemotherapy well.  Review of labs indicate stable hemoglobin without concern for symptomatic anemia at this time.   Questionaires / Pulmonary Flowsheets:   ACT:      No data to display           MMRC: mMRC Dyspnea Scale mMRC Score  08/09/2020  3:42 PM 0    Epworth:      No data to display           Tests:   FENO:  No results found for: "NITRICOXIDE"  PFT:    Latest Ref Rng & Units 03/10/2020    3:36 PM 08/30/2017    3:59 PM  PFT Results  FVC-Pre L 2.94  P 3.14   FVC-Predicted Pre % 68  P 72   FVC-Post L 2.90  P 3.18   FVC-Predicted Post % 67  P 73   Pre FEV1/FVC % % 66  P 70   Post FEV1/FCV % % 68  P 73   FEV1-Pre L 1.93  P 2.19   FEV1-Predicted Pre % 62  P 69   FEV1-Post L 1.96  P 2.31   DLCO uncorrected ml/min/mmHg 19.10  P 17.73   DLCO UNC% % 75  P 52   DLCO corrected ml/min/mmHg 21.40  P 20.68   DLCO COR %Predicted % 84  P 61   DLVA Predicted % 118  P 98  TLC L 5.02  P 4.99   TLC % Predicted % 69  P 68   RV % Predicted % 76  P 71     P Preliminary result   Personally reviewed and interpreted as normal spirometry, reduced DLCO, moderate reduced TLC  WALK:     08/28/2017   12:00 PM  SIX MIN WALK  Supplimental Oxygen during Test? (L/min) No  Tech Comments: pt walked a fast pace, tolerated walk well.     Imaging: Personally reviewed and as per EMR and discussion in this note  LONG TERM MONITOR (3-14 DAYS)  Result Date: 03/13/2022   Normal sinus rhythm with frequent PACs and rare PVCs.   Brief runs of PACs, lastig just a few seconds, and not assocaited with symptoms.   Patient symptoms correlated to PACs.   No sustained pathologic arrhythmias. Patch Wear Time:  13 days and 21 hours (2023-08-30T09:00:27-0400 to 2023-09-13T06:13:19-0400) Patient had a min HR of 52 bpm, max HR of 146 bpm, and avg HR of 74  bpm. Predominant underlying rhythm was Sinus Rhythm. First Degree AV Block was present. 1 run of Ventricular Tachycardia occurred lasting 4 beats with a max rate of 146 bpm (avg 125 bpm). 10 Supraventricular Tachycardia runs occurred, the run with the fastest interval lasting 5 beats with a max rate of 135 bpm, the longest lasting 8 beats with an avg rate of 100 bpm. Isolated SVEs were frequent (6.4%, 91513), SVE Couplets were rare (<1.0%, 189), and SVE Triplets were rare (<1.0%, 22). Isolated VEs were rare (<1.0%, 230), VE Couplets were rare (<1.0%, 13), and VE Triplets were rare (<1.0%, 2). Inverted QRS complexes possibly due to inverted placement of device.    Lab Results: Personally reviewed, eosinophils routinely 200-300 CBC    Component Value Date/Time   WBC 4.5 08/01/2021 0857   WBC 4.7 05/29/2021 0359   RBC 4.83 08/01/2021 0857   RBC 4.05 (L) 05/29/2021 0359   HGB 12.7 (L) 08/01/2021 0857   HCT 40.6 08/01/2021 0857   PLT 171 08/01/2021 0857   MCV 84 08/01/2021 0857   MCH 26.3 (L) 08/01/2021 0857   MCH 25.2 (L) 05/29/2021 0359   MCHC 31.3 (L) 08/01/2021 0857   MCHC 30.8 05/29/2021 0359   RDW 20.1 (H) 08/01/2021 0857   LYMPHSABS 0.4 (L) 05/29/2021 0054   MONOABS 1.0 05/29/2021 0054   EOSABS 0.1 05/29/2021 0054   BASOSABS 0.0 05/29/2021 0054    BMET    Component Value Date/Time   NA 141 09/09/2021 1719   K 4.4 09/09/2021 1719   CL 99 09/09/2021 1719   CO2 24 09/09/2021 1719   GLUCOSE 100 (H) 09/09/2021 1719   GLUCOSE 112 (H) 05/29/2021 0359   BUN 15 09/09/2021 1719   CREATININE 0.80 09/09/2021 1719   CREATININE 0.83 06/07/2020 1119   CREATININE 1.02 05/16/2016 0937   CALCIUM 9.5 09/09/2021 1719   GFRNONAA >60 05/29/2021 0359   GFRNONAA >60 06/07/2020 1119   GFRAA >60 03/10/2020 0855    BNP    Component Value Date/Time   BNP 79.4 01/22/2021 1634   BNP 43.4 05/16/2016 0937    ProBNP    Component Value Date/Time   PROBNP 69 08/01/2021 0857   PROBNP 60.0  08/10/2014 1052    Specialty Problems       Pulmonary Problems   Pulmonary fibrosis (HCC)   DOE (dyspnea on exertion)   Asthmatic bronchitis with acute exacerbation   Pleural effusion    No Known Allergies  Immunization  History  Administered Date(s) Administered   DTaP / IPV 09/25/2012   Fluad Quad(high Dose 65+) 02/19/2019, 03/14/2022   Influenza Split 03/27/2014   Influenza, High Dose Seasonal PF 03/27/2014, 01/27/2017, 02/22/2018, 03/12/2021   Influenza-Unspecified 02/24/2017, 03/10/2020   PFIZER Comirnaty(Gray Top)Covid-19 Tri-Sucrose Vaccine 06/27/2019, 07/21/2019   PFIZER(Purple Top)SARS-COV-2 Vaccination 07/11/2019, 07/21/2019, 01/26/2020, 10/26/2020   Pneumococcal Conjugate-13 03/27/2014   Pneumococcal Polysaccharide-23 11/10/2009   Pneumococcal-Unspecified 03/12/2014   Tdap 03/24/2011, 09/25/2012, 05/28/2021   Zoster Recombinat (Shingrix) 09/01/2018, 02/19/2019   Zoster, Live 03/21/2010    Past Medical History:  Diagnosis Date   Adenocarcinoma of cecum ([place]) 06/21/2017   Arthritis    "mid back; hands; knees" (02/24/2015)   Basal cell carcinoma    left shoulder; mid chest; right eyelid (02/24/2015)   CAD (coronary artery disease)    a. 08/2014 Cath/PCI: LM nl, LAD 30p, D1 95 (2.25x12 Resolute Integrity DES), LCX small, RI 100 (attempted PCI) - branches fill via L->L collats, RCA dominant, nl, RPDA/PLA nl, EF 60^. b. 02/24/2015 PCI CTO of Ramus DES x2.   Chronic bronchitis ([place])    hx   Diverticulosis 05/2005   Elevated lipase    Fuchs' corneal dystrophy    History of adenomatous polyp of colon 05/2005   8 mm adenoma   History of blood transfusion    "related to some of my surgeries"   Hyperlipidemia    Hypertension    Iron deficiency anemia due to chronic blood loss 06/21/2017   Pneumonia    Prostate cancer ([place]) 2011   S/P seed implant   Sarcoidosis    Thrombocytopenia ([place])    a. Noted on prior labs, unclear of what w/u done.   Trifascicular block      Tobacco History: Social History   Tobacco Use  Smoking Status Light Smoker   Packs/day: 0.00   Years: 59.00   Total pack years: 0.00   Types: Cigars, Cigarettes   Last attempt to quit: 02/24/2015   Years since quitting: 7.0  Smokeless Tobacco Never  Tobacco Comments   02/24/2015 "quit cigarettes ~ 2000 ago but smokes cigars about once every two months. ALS 03/14/2022   Ready to quit: Not Answered Counseling given: Not Answered Tobacco comments: 02/24/2015 "quit cigarettes ~ 2000 ago but smokes cigars about once every two months. ALS 03/14/2022   Continue to not smoke  Outpatient Encounter Medications as of 03/14/2022  Medication Sig   acetaminophen (TYLENOL) 325 MG tablet Take 650 mg by mouth every 6 (six) hours as needed for moderate pain.   albuterol (VENTOLIN HFA) 108 (90 Base) MCG/ACT inhaler Inhale 1-2 puffs into the lungs every 6 (six) hours as needed for wheezing or shortness of breath.   amLODipine (NORVASC) 5 MG tablet Take 1 tablet (5 mg total) by mouth daily.   cetirizine (ZYRTEC ALLERGY) 10 MG tablet Take 1 tablet (10 mg total) by mouth daily.   Cholecalciferol (VITAMIN D3) 2000 units capsule Take 2,000 Units by mouth daily.   clobetasol (TEMOVATE) 0.05 % external solution Apply 1 Application topically 2 (two) times daily.   clopidogrel (PLAVIX) 75 MG tablet Take 75 mg by mouth daily.   fluticasone (FLONASE) 50 MCG/ACT nasal spray Place 2 sprays into both nostrils daily.   furosemide (LASIX) 20 MG tablet Take 1 tablet (20 mg total) by mouth daily.   gabapentin (NEURONTIN) 100 MG capsule Take 200 mg by mouth at bedtime as needed (neuropathy).   ibuprofen (ADVIL) 200 MG tablet Take 400 mg by mouth every 6 (  six) hours as needed for pain.   metoprolol succinate (TOPROL XL) 50 MG 24 hr tablet Take 1 tablet (50 mg total) by mouth every evening. Take with or immediately following a meal.   Multiple Vitamins-Minerals (VITRUM SENIOR) TABS Take 1 tablet by mouth daily.    naproxen sodium (ALEVE) 220 MG tablet Take 440 mg by mouth daily as needed (pain).   nitroGLYCERIN (NITROSTAT) 0.4 MG SL tablet PLACE 1 TABLET UNDER THE TONGUE EVERY 5 MINUTES AS NEEDED FOR CHEST PAIN FOR 3 DOSES (Patient taking differently: Place 0.4 mg under the tongue every 5 (five) minutes as needed.)   ranolazine (RANEXA) 500 MG 12 hr tablet TAKE 1 TABLET BY MOUTH TWICE A DAY   rosuvastatin (CRESTOR) 20 MG tablet Take 1 tablet (20 mg total) by mouth daily.   traMADol (ULTRAM) 50 MG tablet Take 2 tablets (100 mg total) by mouth every 6 (six) hours as needed.   [DISCONTINUED] fluticasone (FLONASE) 50 MCG/ACT nasal spray Place 2 sprays into the nose daily.   No facility-administered encounter medications on file as of 03/14/2022.     Review of Systems  N/a  Physical Exam  BP 130/68 (BP Location: Right Arm, Patient Position: Sitting, Cuff Size: Normal)   Pulse 79   Ht '5\' 10"'$  (1.778 m)   Wt 233 lb 3.2 oz (105.8 kg)   SpO2 97%   BMI 33.46 kg/m   Wt Readings from Last 5 Encounters:  03/14/22 233 lb 3.2 oz (105.8 kg)  02/10/22 229 lb (103.9 kg)  02/08/22 229 lb 12.8 oz (104.2 kg)  11/25/21 229 lb 9.6 oz (104.1 kg)  10/18/21 227 lb 12.8 oz (103.3 kg)    BMI Readings from Last 5 Encounters:  03/14/22 33.46 kg/m  02/10/22 32.86 kg/m  02/08/22 32.97 kg/m  11/25/21 32.94 kg/m  10/18/21 32.69 kg/m     Physical Exam General: Well-appearing, no acute distress Eyes: EOMI, no icterus Neck: No JVD supple Respiratory:L clear to auscultation bilaterally, no wheezes or crackles, diminished in the right base Cardiovascular: Regular rhythm, no murmurs Abdomen: Nondistended, bowel sounds present Psych: Normal mood, full affect   Assessment & Plan:   Recurrent right pleural effusion: Suspect related to local inflammation due to metastasis to liver, liver resection.  Cytology negative.  Improvement in dyspnea with drainage.  Plan repeat thoracentesis planned later this week,  holding Plavix for, last dose 9/30, tentative plan Friday, 10/6.  Cough: In the mornings.  Feels like postnasal drip.  A bit better with Benadryl at night.  Start Zyrtec at night.  Increase Flonase to twice a day for 1 week then decrease to daily.  Sarcoidosis: Burnt out, no evidence of active disease on recent imaging.  Return in about 3 months (around 06/14/2022).   Lanier Clam, MD 03/14/2022

## 2022-03-14 NOTE — Progress Notes (Signed)
$'@Patient'f$  ID: Luis [name], male    DOB: May 12, 1941, 81 y.o.   MRN: [id number]  Chief Complaint  Patient presents with   Follow-up    Pt is here for follow up for DOE. Pt had last thoro done 6/20. Pt states he feels like he is needing another thoro done. Pt states he has been getting increased SOB even getting dressed recently.     Referring provider: [name], MD  HPI:   81 y.o. man whom we are seeing in follow-up of multiple pulmonary issues including burnt out sarcoid, asthma, ongoing chronic of right pleural effusion.  Most recent oncology note from [place] reviewed.  Doing well. Serial scans at [facility] or oncologic surveillance show accumulation of pleural effusion on my review, most recent scan 12/2021.  More symptomatic in terms of dyspnea last week or so.  Feels like fluid is returned.  Has nagging cough.  Congestion.  Feels like draining from the back of his throat accumulating in the morning.  Coughs up phlegm and better throughout the day.  Has taken Benadryl a couple nights with some improvement.  Continues Flonase once a day.   HPI at initial visit: Overall, disorders are largely unchanged.  Formally followed with [name] for sarcoidosis.  This has been quiescent for some time.  Most recent CT scan reviewed which reveals on my interpretation stable bilateral fibrosis, scattered calcified granulomas in the lung as well as significant calcified hilar and mediastinal lymphadenopathy and sequela of prior lung surgery.  He has been seen in clinic recently by [name].  Primary complaint was dyspnea on exertion as well as cough.  These persist.  Chest imaging obtained as above.  PFTs obtained last week which reveal suggestion of mild to moderate restriction on spirometry without significant bronchodilator response.  Moderate restriction confirmed with total lung capacity 69% of predicted.  DLCO corrects to within normal limits with adjustment for hemoglobin.  Cough and  shortness of breath coincide with worsening nasal congestion.  Postnasal drip.  Not really using meds to address this this.  He is tolerating chemotherapy well.  Review of labs indicate stable hemoglobin without concern for symptomatic anemia at this time.   Questionaires / Pulmonary Flowsheets:   ACT:      No data to display           MMRC: mMRC Dyspnea Scale mMRC Score  08/09/2020  3:42 PM 0    Epworth:      No data to display           Tests:   FENO:  No results found for: "NITRICOXIDE"  PFT:    Latest Ref Rng & Units 03/10/2020    3:36 PM 08/30/2017    3:59 PM  PFT Results  FVC-Pre L 2.94  P 3.14   FVC-Predicted Pre % 68  P 72   FVC-Post L 2.90  P 3.18   FVC-Predicted Post % 67  P 73   Pre FEV1/FVC % % 66  P 70   Post FEV1/FCV % % 68  P 73   FEV1-Pre L 1.93  P 2.19   FEV1-Predicted Pre % 62  P 69   FEV1-Post L 1.96  P 2.31   DLCO uncorrected ml/min/mmHg 19.10  P 17.73   DLCO UNC% % 75  P 52   DLCO corrected ml/min/mmHg 21.40  P 20.68   DLCO COR %Predicted % 84  P 61   DLVA Predicted % 118  P 98  TLC L 5.02  P 4.99   TLC % Predicted % 69  P 68   RV % Predicted % 76  P 71     P Preliminary result   Personally reviewed and interpreted as normal spirometry, reduced DLCO, moderate reduced TLC  WALK:     08/28/2017   12:00 PM  SIX MIN WALK  Supplimental Oxygen during Test? (L/min) No  Tech Comments: pt walked a fast pace, tolerated walk well.     Imaging: Personally reviewed and as per EMR and discussion in this note  LONG TERM MONITOR (3-14 DAYS)  Result Date: 03/13/2022   Normal sinus rhythm with frequent PACs and rare PVCs.   Brief runs of PACs, lastig just a few seconds, and not assocaited with symptoms.   Patient symptoms correlated to PACs.   No sustained pathologic arrhythmias. Patch Wear Time:  13 days and 21 hours (2023-08-30T09:00:27-0400 to 2023-09-13T06:13:19-0400) Patient had a min HR of 52 bpm, max HR of 146 bpm, and avg HR of 74  bpm. Predominant underlying rhythm was Sinus Rhythm. First Degree AV Block was present. 1 run of Ventricular Tachycardia occurred lasting 4 beats with a max rate of 146 bpm (avg 125 bpm). 10 Supraventricular Tachycardia runs occurred, the run with the fastest interval lasting 5 beats with a max rate of 135 bpm, the longest lasting 8 beats with an avg rate of 100 bpm. Isolated SVEs were frequent (6.4%, 91513), SVE Couplets were rare (<1.0%, 189), and SVE Triplets were rare (<1.0%, 22). Isolated VEs were rare (<1.0%, 230), VE Couplets were rare (<1.0%, 13), and VE Triplets were rare (<1.0%, 2). Inverted QRS complexes possibly due to inverted placement of device.    Lab Results: Personally reviewed, eosinophils routinely 200-300 CBC    Component Value Date/Time   WBC 4.5 08/01/2021 0857   WBC 4.7 05/29/2021 0359   RBC 4.83 08/01/2021 0857   RBC 4.05 (L) 05/29/2021 0359   HGB 12.7 (L) 08/01/2021 0857   HCT 40.6 08/01/2021 0857   PLT 171 08/01/2021 0857   MCV 84 08/01/2021 0857   MCH 26.3 (L) 08/01/2021 0857   MCH 25.2 (L) 05/29/2021 0359   MCHC 31.3 (L) 08/01/2021 0857   MCHC 30.8 05/29/2021 0359   RDW 20.1 (H) 08/01/2021 0857   LYMPHSABS 0.4 (L) 05/29/2021 0054   MONOABS 1.0 05/29/2021 0054   EOSABS 0.1 05/29/2021 0054   BASOSABS 0.0 05/29/2021 0054    BMET    Component Value Date/Time   NA 141 09/09/2021 1719   K 4.4 09/09/2021 1719   CL 99 09/09/2021 1719   CO2 24 09/09/2021 1719   GLUCOSE 100 (H) 09/09/2021 1719   GLUCOSE 112 (H) 05/29/2021 0359   BUN 15 09/09/2021 1719   CREATININE 0.80 09/09/2021 1719   CREATININE 0.83 06/07/2020 1119   CREATININE 1.02 05/16/2016 0937   CALCIUM 9.5 09/09/2021 1719   GFRNONAA >60 05/29/2021 0359   GFRNONAA >60 06/07/2020 1119   GFRAA >60 03/10/2020 0855    BNP    Component Value Date/Time   BNP 79.4 01/22/2021 1634   BNP 43.4 05/16/2016 0937    ProBNP    Component Value Date/Time   PROBNP 69 08/01/2021 0857   PROBNP 60.0  08/10/2014 1052    Specialty Problems       Pulmonary Problems   Pulmonary fibrosis (HCC)   DOE (dyspnea on exertion)   Asthmatic bronchitis with acute exacerbation   Pleural effusion    No Known Allergies  Immunization  History  Administered Date(s) Administered   DTaP / IPV 09/25/2012   Fluad Quad(high Dose 65+) 02/19/2019, 03/14/2022   Influenza Split 03/27/2014   Influenza, High Dose Seasonal PF 03/27/2014, 01/27/2017, 02/22/2018, 03/12/2021   Influenza-Unspecified 02/24/2017, 03/10/2020   PFIZER Comirnaty(Gray Top)Covid-19 Tri-Sucrose Vaccine 06/27/2019, 07/21/2019   PFIZER(Purple Top)SARS-COV-2 Vaccination 07/11/2019, 07/21/2019, 01/26/2020, 10/26/2020   Pneumococcal Conjugate-13 03/27/2014   Pneumococcal Polysaccharide-23 11/10/2009   Pneumococcal-Unspecified 03/12/2014   Tdap 03/24/2011, 09/25/2012, 05/28/2021   Zoster Recombinat (Shingrix) 09/01/2018, 02/19/2019   Zoster, Live 03/21/2010    Past Medical History:  Diagnosis Date   Adenocarcinoma of cecum ([place]) 06/21/2017   Arthritis    "mid back; hands; knees" (02/24/2015)   Basal cell carcinoma    left shoulder; mid chest; right eyelid (02/24/2015)   CAD (coronary artery disease)    a. 08/2014 Cath/PCI: LM nl, LAD 30p, D1 95 (2.25x12 Resolute Integrity DES), LCX small, RI 100 (attempted PCI) - branches fill via L->L collats, RCA dominant, nl, RPDA/PLA nl, EF 60^. b. 02/24/2015 PCI CTO of Ramus DES x2.   Chronic bronchitis ([place])    hx   Diverticulosis 05/2005   Elevated lipase    Fuchs' corneal dystrophy    History of adenomatous polyp of colon 05/2005   8 mm adenoma   History of blood transfusion    "related to some of my surgeries"   Hyperlipidemia    Hypertension    Iron deficiency anemia due to chronic blood loss 06/21/2017   Pneumonia    Prostate cancer ([place]) 2011   S/P seed implant   Sarcoidosis    Thrombocytopenia ([place])    a. Noted on prior labs, unclear of what w/u done.   Trifascicular block      Tobacco History: Social History   Tobacco Use  Smoking Status Light Smoker   Packs/day: 0.00   Years: 59.00   Total pack years: 0.00   Types: Cigars, Cigarettes   Last attempt to quit: 02/24/2015   Years since quitting: 7.0  Smokeless Tobacco Never  Tobacco Comments   02/24/2015 "quit cigarettes ~ 2000 ago but smokes cigars about once every two months. ALS 03/14/2022   Ready to quit: Not Answered Counseling given: Not Answered Tobacco comments: 02/24/2015 "quit cigarettes ~ 2000 ago but smokes cigars about once every two months. ALS 03/14/2022   Continue to not smoke  Outpatient Encounter Medications as of 03/14/2022  Medication Sig   acetaminophen (TYLENOL) 325 MG tablet Take 650 mg by mouth every 6 (six) hours as needed for moderate pain.   albuterol (VENTOLIN HFA) 108 (90 Base) MCG/ACT inhaler Inhale 1-2 puffs into the lungs every 6 (six) hours as needed for wheezing or shortness of breath.   amLODipine (NORVASC) 5 MG tablet Take 1 tablet (5 mg total) by mouth daily.   cetirizine (ZYRTEC ALLERGY) 10 MG tablet Take 1 tablet (10 mg total) by mouth daily.   Cholecalciferol (VITAMIN D3) 2000 units capsule Take 2,000 Units by mouth daily.   clobetasol (TEMOVATE) 0.05 % external solution Apply 1 Application topically 2 (two) times daily.   clopidogrel (PLAVIX) 75 MG tablet Take 75 mg by mouth daily.   fluticasone (FLONASE) 50 MCG/ACT nasal spray Place 2 sprays into both nostrils daily.   furosemide (LASIX) 20 MG tablet Take 1 tablet (20 mg total) by mouth daily.   gabapentin (NEURONTIN) 100 MG capsule Take 200 mg by mouth at bedtime as needed (neuropathy).   ibuprofen (ADVIL) 200 MG tablet Take 400 mg by mouth every 6 (  six) hours as needed for pain.   metoprolol succinate (TOPROL XL) 50 MG 24 hr tablet Take 1 tablet (50 mg total) by mouth every evening. Take with or immediately following a meal.   Multiple Vitamins-Minerals (VITRUM SENIOR) TABS Take 1 tablet by mouth daily.    naproxen sodium (ALEVE) 220 MG tablet Take 440 mg by mouth daily as needed (pain).   nitroGLYCERIN (NITROSTAT) 0.4 MG SL tablet PLACE 1 TABLET UNDER THE TONGUE EVERY 5 MINUTES AS NEEDED FOR CHEST PAIN FOR 3 DOSES (Patient taking differently: Place 0.4 mg under the tongue every 5 (five) minutes as needed.)   ranolazine (RANEXA) 500 MG 12 hr tablet TAKE 1 TABLET BY MOUTH TWICE A DAY   rosuvastatin (CRESTOR) 20 MG tablet Take 1 tablet (20 mg total) by mouth daily.   traMADol (ULTRAM) 50 MG tablet Take 2 tablets (100 mg total) by mouth every 6 (six) hours as needed.   [DISCONTINUED] fluticasone (FLONASE) 50 MCG/ACT nasal spray Place 2 sprays into the nose daily.   No facility-administered encounter medications on file as of 03/14/2022.     Review of Systems  N/a  Physical Exam  BP 130/68 (BP Location: Right Arm, Patient Position: Sitting, Cuff Size: Normal)   Pulse 79   Ht '5\' 10"'$  (1.778 m)   Wt 233 lb 3.2 oz (105.8 kg)   SpO2 97%   BMI 33.46 kg/m   Wt Readings from Last 5 Encounters:  03/14/22 233 lb 3.2 oz (105.8 kg)  02/10/22 229 lb (103.9 kg)  02/08/22 229 lb 12.8 oz (104.2 kg)  11/25/21 229 lb 9.6 oz (104.1 kg)  10/18/21 227 lb 12.8 oz (103.3 kg)    BMI Readings from Last 5 Encounters:  03/14/22 33.46 kg/m  02/10/22 32.86 kg/m  02/08/22 32.97 kg/m  11/25/21 32.94 kg/m  10/18/21 32.69 kg/m     Physical Exam General: Well-appearing, no acute distress Eyes: EOMI, no icterus Neck: No JVD supple Respiratory:L clear to auscultation bilaterally, no wheezes or crackles, diminished in the right base Cardiovascular: Regular rhythm, no murmurs Abdomen: Nondistended, bowel sounds present Psych: Normal mood, full affect   Assessment & Plan:   Recurrent right pleural effusion: Suspect related to local inflammation due to metastasis to liver, liver resection.  Cytology negative.  Improvement in dyspnea with drainage.  Plan repeat thoracentesis planned later this week,  holding Plavix for, last dose 9/30, tentative plan Friday, 10/6.  Cough: In the mornings.  Feels like postnasal drip.  A bit better with Benadryl at night.  Start Zyrtec at night.  Increase Flonase to twice a day for 1 week then decrease to daily.  Sarcoidosis: Burnt out, no evidence of active disease on recent imaging.  Return in about 3 months (around 06/14/2022).   Lanier Clam, MD 03/14/2022

## 2022-03-14 NOTE — Telephone Encounter (Signed)
Prescription sent to [facility].  I spoke with patient and reviewed use of Toprol and possible side effects.  Information sent through my chart.  Appointment made for patient to see Christen Bame, NP on 11/7

## 2022-03-15 ENCOUNTER — Other Ambulatory Visit (HOSPITAL_COMMUNITY): Payer: Self-pay | Admitting: Pulmonary Disease

## 2022-03-15 ENCOUNTER — Telehealth: Payer: Self-pay | Admitting: Pulmonary Disease

## 2022-03-15 DIAGNOSIS — R0602 Shortness of breath: Secondary | ICD-10-CM

## 2022-03-15 NOTE — Addendum Note (Signed)
Addended byLarey Days on: 03/15/2022 09:47 AM   Modules accepted: Orders

## 2022-03-15 NOTE — Telephone Encounter (Signed)
Please let patient know thoracentesis is scheduled for 2 pm at [facility] end[facility]. Please arrive at 130 pm.

## 2022-03-15 NOTE — Telephone Encounter (Signed)
Can we schedule a thoracentesis at [facility] endoscopy on Friday, preferably in the afternoon.  Case request has been placed.  Thank you!

## 2022-03-15 NOTE — Telephone Encounter (Signed)
Called and spoke with patient about procedure that is scheduled with Dr Silas Flood on Friday at [place] him to arrive by 1:30pm and check in. Told him the location was [facility].  Advised him to stop Plavix but he stated he stopped on Saturday. I told him I would call him Friday after speaking with Dr Silas Flood about when to restart his medication.   He verbalized understanding. Also sent a mychart message with details per patients request. Nothing further needed

## 2022-03-17 ENCOUNTER — Ambulatory Visit (HOSPITAL_COMMUNITY)
Admission: RE | Admit: 2022-03-17 | Discharge: 2022-03-17 | Disposition: A | Discharge status: Home / Self Care | Payer: Medicare PPO | Attending: Pulmonary Disease | Admitting: Pulmonary Disease

## 2022-03-17 ENCOUNTER — Other Ambulatory Visit: Payer: Self-pay

## 2022-03-17 ENCOUNTER — Encounter (HOSPITAL_COMMUNITY): Payer: Self-pay | Admitting: Pulmonary Disease

## 2022-03-17 ENCOUNTER — Encounter (HOSPITAL_COMMUNITY)
Admission: RE | Disposition: A | Discharge status: Home / Self Care | Payer: Self-pay | Source: Home / Self Care | Attending: Pulmonary Disease

## 2022-03-17 DIAGNOSIS — C787 Secondary malignant neoplasm of liver and intrahepatic bile duct: Secondary | ICD-10-CM | POA: Insufficient documentation

## 2022-03-17 DIAGNOSIS — Z9889 Other specified postprocedural states: Secondary | ICD-10-CM | POA: Insufficient documentation

## 2022-03-17 DIAGNOSIS — J90 Pleural effusion, not elsewhere classified: Secondary | ICD-10-CM | POA: Diagnosis not present

## 2022-03-17 DIAGNOSIS — R0609 Other forms of dyspnea: Secondary | ICD-10-CM | POA: Insufficient documentation

## 2022-03-17 DIAGNOSIS — J45909 Unspecified asthma, uncomplicated: Secondary | ICD-10-CM | POA: Diagnosis not present

## 2022-03-17 DIAGNOSIS — Z79899 Other long term (current) drug therapy: Secondary | ICD-10-CM | POA: Insufficient documentation

## 2022-03-17 DIAGNOSIS — C801 Malignant (primary) neoplasm, unspecified: Secondary | ICD-10-CM | POA: Insufficient documentation

## 2022-03-17 DIAGNOSIS — D860 Sarcoidosis of lung: Secondary | ICD-10-CM | POA: Diagnosis not present

## 2022-03-17 HISTORY — PX: THORACENTESIS: SHX235

## 2022-03-17 SURGERY — THORACENTESIS
Anesthesia: LOCAL

## 2022-03-17 NOTE — Op Note (Signed)
Thoracentesis  Procedure Note  ROLLIE HYNEK  [id number]  January 29, 1941  Date:03/17/22  Time:4:08 PM   Provider Performing:Ian Cavey R Vernisha Bacote   Procedure: Thoracentesis with imaging guidance (11657)  Indication(s) Pleural Effusion  Consent Risks of the procedure as well as the alternatives and risks of each were explained to the patient and/or caregiver.  Consent for the procedure was obtained and is signed in the bedside chart  Anesthesia Topical only with 1% lidocaine    Time Out Verified patient identification, verified procedure, site/side was marked, verified correct patient position, special equipment/implants available, medications/allergies/relevant history reviewed, required imaging and test results available.   Sterile Technique Maximal sterile technique including full sterile barrier drape, hand hygiene, sterile gown, sterile gloves, mask, hair covering, sterile ultrasound probe cover (if used).  Procedure Description Ultrasound was used to identify appropriate pleural anatomy for placement and overlying skin marked.  Area of drainage cleaned and draped in sterile fashion. Lidocaine was used to anesthetize the skin and subcutaneous tissue.  1100 cc's of serous appearing fluid was drained from the right pleural space. Procedure was terminated due to cessation of fluid. Catheter then removed and bandaid applied to site.   Complications/Tolerance None; patient tolerated the procedure well.   EBL Minimal   Specimen(s) None

## 2022-03-17 NOTE — Interval H&P Note (Signed)
History and Physical Interval Note:  03/17/2022 1:49 PM  Luis [name]  has presented today for surgery, with the diagnosis of , shortness of breath.  The various methods of treatment have been discussed with the patient and family. After consideration of risks, benefits and other options for treatment, the patient has consented to  Procedure(s): THORACENTESIS (N/A) as a surgical intervention.  The patient's history has been reviewed, patient examined, no change in status, stable for surgery.  I have reviewed the patient's chart and labs.  Questions were answered to the patient's satisfaction.     Bonna Gains [name]

## 2022-03-21 ENCOUNTER — Encounter (HOSPITAL_COMMUNITY): Payer: Self-pay | Admitting: Pulmonary Disease

## 2022-03-27 DIAGNOSIS — C787 Secondary malignant neoplasm of liver and intrahepatic bile duct: Secondary | ICD-10-CM | POA: Diagnosis not present

## 2022-03-27 DIAGNOSIS — C189 Malignant neoplasm of colon, unspecified: Secondary | ICD-10-CM | POA: Diagnosis not present

## 2022-03-27 DIAGNOSIS — Z9049 Acquired absence of other specified parts of digestive tract: Secondary | ICD-10-CM | POA: Diagnosis not present

## 2022-03-27 DIAGNOSIS — Z79899 Other long term (current) drug therapy: Secondary | ICD-10-CM | POA: Diagnosis not present

## 2022-03-27 DIAGNOSIS — C182 Malignant neoplasm of ascending colon: Secondary | ICD-10-CM | POA: Diagnosis not present

## 2022-03-27 DIAGNOSIS — I3139 Other pericardial effusion (noninflammatory): Secondary | ICD-10-CM | POA: Diagnosis not present

## 2022-03-27 DIAGNOSIS — R0609 Other forms of dyspnea: Secondary | ICD-10-CM | POA: Diagnosis not present

## 2022-03-27 DIAGNOSIS — J90 Pleural effusion, not elsewhere classified: Secondary | ICD-10-CM | POA: Diagnosis not present

## 2022-03-27 DIAGNOSIS — J811 Chronic pulmonary edema: Secondary | ICD-10-CM | POA: Diagnosis not present

## 2022-04-03 ENCOUNTER — Inpatient Hospital Stay: Payer: Medicare PPO | Admitting: Oncology

## 2022-04-04 ENCOUNTER — Inpatient Hospital Stay: Payer: Medicare PPO | Admitting: Oncology

## 2022-04-04 DIAGNOSIS — K769 Liver disease, unspecified: Secondary | ICD-10-CM | POA: Diagnosis not present

## 2022-04-16 NOTE — H&P (View-Only) (Signed)
Cardiology Office Note:    Date:  04/18/2022   ID:  Luis [name], DOB November 24, 1940, MRN [id number]  PCP:  Luis Sake, MD   [facility] Providers Cardiologist:  Luis Grooms, MD     Referring MD: Luis Sake, MD   Chief Complaint: dyspnea  History of Present Illness:    Luis [name] is a pleasant 81 y.o. male with a hx of CAD, HTN, HLD, carotid stenosis, and adenocarcinoma of colon.   History of CAD to diagonal 08/2014 with ramus intermedius totally occluded at the time.  He eventually underwent CTO PCI with overlapping DES x2 to the ramus 02/2015. Chest pain 05/2016 relieved with ntg, off Ranexa NST 12/2017 normal study with normal LVEF.  Found to have totally occluded right carotid on Dopplers 04/2018 but no other stenosis.  Adenocarcinoma of the colon s/p colectomy.  He underwent cardiac catheterization 01/02/2023 DOE and left arm pain relieved with rest.  Was walking 3 miles daily but had to cut to 1-1/2 miles because of shortness of breath, no chest tightness.  Had gained 10 to 12 pounds.  Received DES to LAD and balloon angioplasty to the diagonal bifurcation.  Plan for aspirin and Plavix for at least 6 months.  [place] - chemotherapy for adenocarcinoma of colon with liver metastasis then surgery once tumor shrunk.  He decided not to have surgery so he was treated with chemotherapy and will have an ablation of the tumor on the liver. Had an episode of chest pain, seen by Dr. Martinique at hospital and troponin was negative, EKG unchanged. Consideration of repeat cath is symptoms persist. Negative work-up in ED 01/2021, hgb was 9.6.  Noted at [facility] that he had used additional diuretics for pleural effusions and shortness of breath.  CXR on 01/22/2021 showed significant improvement in right-sided pleural effusion.   He was last seen in our office by Luis [name] on 08/01/2021 at which time DOE persisted and consideration was given to further ischemic evaluation. He  underwent thoracentesis 08/16/21 with [facility].   He called our office 02/08/2022 with intermittent episodes of elevated HR x2 weeks.  HR previously 110 bpm with walking now up to 145 and taking him longer to recover.  Also notes DOE.  No chest pain.  He was advised to keep his 54-monthfollow-up appointment scheduled for 8/30 and call back if symptoms worsen.  I saw him in the office on 02/08/22 for evaluation of elevated HR. These episodes started prior to starting prednisone which he started for persistent rash today. Reports DOE, feeling tired, and wakes up in the morning with chest pain that resolves after a few minutes. Chest pain occurs at other times as well, however by the time he thinks about it it is gone.  It is not specifically associated with worsening DOE.  DOE most severe when walking up inclines.  Also condones funny feeling in his chest that is difficult to describe. Has persistent pleural effusion for which he has had total of 3 thoracentesis, follows closely with pulmonology. Feels like there is fluid in lungs, but it is not limited his breathing. HR ranging from low 60s to 150s on smart watch. Later in our discussion of potential causes of fluctuating HR, he reports that a few weeks ago he noticed coffee was dripping out of the right side of his mouth with a slight droop noted that resolved on his own. LLeane Call ordered to rule out worsening ischemia on 02/10/22, revealed no  ischemia or infarction, EF 63%.  On 03/13/2022 cardiac monitor revealed NSR with frequent PACs, no atrial fibrillation, no significant arrhythmias.  He was advised to start metoprolol succinate 50 mg each evening.  Today, he reports he continues to have DOE.  Feels that it has worsened since last office visit. This was his only symptom of angina prior to stenting in 2020. Palpitations have improved. He does feel fatigued, could be side effect of metoprolol. Recently cleared by pulmonology, having more time  in between needing thoracentesis. He denies edema,  orthhopnea, PND, and chest pain. Dreams that he is short of breath and wakes up panicking. Remains active working at [employer], no formal exercise. Monitors BP at home which is well-controlled with systolic generally 557-322 mmHg. No bleeding problems.   Past Medical History:  Diagnosis Date   Adenocarcinoma of cecum ([place]) 06/21/2017   Arthritis    "mid back; hands; knees" (02/24/2015)   Basal cell carcinoma    left shoulder; mid chest; right eyelid (02/24/2015)   CAD (coronary artery disease)    a. 08/2014 Cath/PCI: LM nl, LAD 30p, D1 95 (2.25x12 Resolute Integrity DES), LCX small, RI 100 (attempted PCI) - branches fill via L->L collats, RCA dominant, nl, RPDA/PLA nl, EF 60^. b. 02/24/2015 PCI CTO of Ramus DES x2.   Chronic bronchitis ([place])    hx   Diverticulosis 05/2005   Elevated lipase    Fuchs' corneal dystrophy    History of adenomatous polyp of colon 05/2005   8 mm adenoma   History of blood transfusion    "related to some of my surgeries"   Hyperlipidemia    Hypertension    Iron deficiency anemia due to chronic blood loss 06/21/2017   Pneumonia    Prostate cancer ([place]) 2011   S/P seed implant   Sarcoidosis    Thrombocytopenia ([place])    a. Noted on prior labs, unclear of what w/u done.   Trifascicular block     Past Surgical History:  Procedure Laterality Date   BASAL CELL CARCINOMA EXCISION Left    shoulder   CARDIAC CATHETERIZATION N/A 02/24/2015   Procedure: Coronary/Bypass Graft CTO Intervention;  Surgeon: Luis Booze, MD;  Location: [facility];  Service: Cardiovascular;  Laterality: N/A;   CARDIAC CATHETERIZATION  02/24/2015   Procedure: Coronary/Graft Atherectomy;  Surgeon: Luis Booze, MD;  Location: [facility] [facility];  Service: Cardiovascular;;   CATARACT EXTRACTION W/ INTRAOCULAR LENS  IMPLANT, BILATERAL Bilateral ~ 2011-2012   COLONOSCOPY W/ POLYPECTOMY  06/08/2005 and 10/18/2010   8 mm adenoma  2006, none 2012. Diverticulosis and internal hemorrhoids.   CORNEAL TRANSPLANT Bilateral ~ 2011-2012   "@ same time as cataract OR"   CORONARY ANGIOPLASTY WITH STENT PLACEMENT  08/2014; 02/24/2015   "1 stent + 1 stent"   CORONARY STENT INTERVENTION N/A 01/02/2019   Procedure: CORONARY STENT INTERVENTION;  Surgeon: Luis Booze, MD;  Location: [place];  Service: Cardiovascular;  Laterality: N/A;   EYE SURGERY     FINGER SURGERY Right 2014   "reattached middle finger"   INSERTION PROSTATE RADIATION SEED  ~ 2011   IR IMAGING GUIDED PORT INSERTION  03/04/2019   IR RADIOLOGIST EVAL & MGMT  05/26/2020   IR US GUIDE BX ASP/DRAIN  03/04/2019   LAPAROSCOPIC CHOLECYSTECTOMY  2015   LAPAROSCOPIC PARTIAL COLECTOMY N/A 07/26/2017   Procedure: LAPAROSCOPIC PARTIAL COLECTOMY, EXCISION SCROTAL CYST;  Surgeon: Greer Pickerel, MD;  Location: [facility];  Service: General;  Laterality:  N/A;   LEFT HEART CATH AND CORONARY ANGIOGRAPHY N/A 01/02/2019   Procedure: LEFT HEART CATH AND CORONARY ANGIOGRAPHY;  Surgeon: Luis Booze, MD;  Location: [facility];  Service: Cardiovascular;  Laterality: N/A;   LEFT HEART CATHETERIZATION WITH CORONARY ANGIOGRAM N/A 08/12/2014   Procedure: LEFT HEART CATHETERIZATION WITH CORONARY ANGIOGRAM;  Surgeon: Blane Ohara, MD;  Location: [facility] C[facility] LAB;  Service: Cardiovascular;  Laterality: N/A;   LYMPH NODE BIOPSY     mid chest   MOHS SURGERY Right ~ 2011   "eyelid; for basal cell"   RADIOLOGY WITH ANESTHESIA N/A 06/30/2020   Procedure: IR WITH ANESTHESIA MICROWAVE ABLATION;  Surgeon: Corrie Mckusick, DO;  Location: [facility];  Service: Anesthesiology;  Laterality: N/A;   THORACENTESIS N/A 02/11/2021   Procedure: Mathews Robinsons;  Surgeon: Lanier Clam, MD;  [facility];  Service: Pulmonary;  Laterality: N/A;   THORACENTESIS Right 04/04/2021   Procedure: THORACENTESIS;  Surgeon: Candee Furbish, MD;  Location: [facility] ENDOSCOPY;  Service: Pulmonary;   Laterality: Right;   THORACENTESIS Right 08/16/2021   Procedure: THORACENTESIS;  Surgeon: Jacky Kindle, MD;  Location: [facility] ENDOSCOPY;  Service: Pulmonary;  Laterality: Right;   THORACENTESIS Right 11/29/2021   Procedure: THORACENTESIS;  Surgeon: [name], MD;  Location: [facility] ENDOSCOPY;  Service: Pulmonary;  Laterality: Right;   THORACENTESIS N/A 03/17/2022   Procedure: Mathews Robinsons;  Surgeon: Lanier Clam, MD;  Location: [facility] ENDOSCOPY;  Service: Pulmonary;  Laterality: N/A;   THOROCOTOMY WITH LOBECTOMY Right ~ 1991   partial removal right lung   TONSILLECTOMY  ~ 1950    Current Medications: Current Meds  Medication Sig   acetaminophen (TYLENOL) 325 MG tablet Take 650 mg by mouth every 6 (six) hours as needed for moderate pain.   albuterol (VENTOLIN HFA) 108 (90 Base) MCG/ACT inhaler Inhale 1-2 puffs into the lungs every 6 (six) hours as needed for wheezing or shortness of breath.   amLODipine (NORVASC) 5 MG tablet Take 1 tablet (5 mg total) by mouth daily.   cetirizine (ZYRTEC ALLERGY) 10 MG tablet Take 1 tablet (10 mg total) by mouth daily.   Cholecalciferol (VITAMIN D3) 2000 units capsule Take 2,000 Units by mouth daily.   clopidogrel (PLAVIX) 75 MG tablet Take 75 mg by mouth daily.   fluticasone (FLONASE) 50 MCG/ACT nasal spray Place 2 sprays into both nostrils daily.   furosemide (LASIX) 40 MG tablet Take 40 mg by mouth daily.   gabapentin (NEURONTIN) 100 MG capsule Take 100 mg by mouth at bedtime as needed (neuropathy).   ibuprofen (ADVIL) 200 MG tablet Take 400 mg by mouth every 6 (six) hours as needed for pain.   metoprolol succinate (TOPROL XL) 50 MG 24 hr tablet Take 1 tablet (50 mg total) by mouth every evening. Take with or immediately following a meal.   Multiple Vitamins-Minerals (VITRUM SENIOR) TABS Take 1 tablet by mouth daily.   naproxen sodium (ALEVE) 220 MG tablet Take 440 mg by mouth daily as needed (pain).   nitroGLYCERIN (NITROSTAT) 0.4 MG SL tablet PLACE  1 TABLET UNDER THE TONGUE EVERY 5 MINUTES AS NEEDED FOR CHEST PAIN FOR 3 DOSES (Patient taking differently: Place 0.4 mg under the tongue every 5 (five) minutes as needed.)   ranolazine (RANEXA) 500 MG 12 hr tablet TAKE 1 TABLET BY MOUTH TWICE A DAY   rosuvastatin (CRESTOR) 20 MG tablet Take 1 tablet (20 mg total) by mouth daily.   traMADol (ULTRAM) 50 MG tablet Take 2 tablets (100  mg total) by mouth every 6 (six) hours as needed.     Allergies:   Patient has no known allergies.   Social History   Socioeconomic History   Marital status: Married    Spouse name: Not on file   Number of children: 2   Years of education: Not on file   Highest education level: Not on file  Occupational History   Occupation: Unemployed  Tobacco Use   Smoking status: Light Smoker    Packs/day: 0.00    Years: 59.00    Total pack years: 0.00    Types: Cigars, Cigarettes    Last attempt to quit: 02/24/2015    Years since quitting: 7.1   Smokeless tobacco: Never   Tobacco comments:    02/24/2015 "quit cigarettes ~ 2000 ago but smokes cigars about once every two months. ALS 03/14/2022  Vaping Use   Vaping Use: Never used  Substance and Sexual Activity   Alcohol use: Yes    Alcohol/week: 2.0 standard drinks of alcohol    Types: 1 Glasses of wine, 1 Standard drinks or equivalent per week    Comment: Drinks maybe 1-2 drinks a week or less   Drug use: No   Sexual activity: Not on file  Other Topics Concern   Not on file  Social History Narrative   Lives at home wife.     Social Determinants of Health   Financial Resource Strain: Not on file  Food Insecurity: No Food Insecurity (03/10/2022)   Hunger Vital Sign    Worried About Running Out of Food in the Last Year: Never true    Ran Out of Food in the Last Year: Never true  Transportation Needs: No Transportation Needs (03/10/2022)   PRAPARE - Hydrologist (Medical): No    Lack of Transportation (Non-Medical): No  Physical  Activity: Not on file  Stress: Not on file  Social Connections: Not on file     Family History: The patient's family history includes Coronary artery disease (age of onset: 22) in his sister; Heart attack in his sister; Heart disease (age of onset: 48) in his father. There is no history of Colon cancer, Throat cancer, Pancreatic cancer, Prostate cancer, Stroke, or Hypertension.  ROS:   Please see the history of present illness.    + DOE All other systems reviewed and are negative.  Labs/Other Studies Reviewed:    The following studies were reviewed today:  Cardiac monitor 03/13/22   Normal sinus rhythm with frequent PACs and rare PVCs.   Brief runs of PACs, lastig just a few seconds, and not assocaited with symptoms.   Patient symptoms correlated to PACs.   No sustained pathologic arrhythmias.   [place] 02/10/22    The study is normal. The study is low risk.   No ST deviation was noted.   LV perfusion is normal. There is no evidence of ischemia. There is no evidence of infarction.   Left ventricular function is normal. Nuclear stress EF: 63 %. The left ventricular ejection fraction is normal (55-65%). End diastolic cavity size is normal. End systolic cavity size is normal.   Prior study available for comparison from 01/02/2018.   Normal resting and stress perfusion. No ischemia or infarction EF 63%   [place] 05/29/21  1. Left ventricular ejection fraction, by estimation, is 60 to 65%. The  left ventricle has normal function. The left ventricle has no regional  wall motion abnormalities. There is mild concentric left  ventricular  hypertrophy. Left ventricular diastolic  parameters are consistent with Grade I diastolic dysfunction (impaired  relaxation).   2. Right ventricular systolic function is normal. The right ventricular  size is normal.   3. The mitral valve is normal in structure. No evidence of mitral valve  regurgitation. No evidence of mitral stenosis.   4.  The aortic valve is tricuspid. There is mild calcification of the  aortic valve. There is mild thickening of the aortic valve. Aortic valve  regurgitation is not visualized. Aortic valve sclerosis is present, with  no evidence of aortic valve stenosis.   5. There is mild dilatation of the ascending aorta, measuring 40 mm.   6. The inferior vena cava is normal in size with greater than 50%  respiratory variability, suggesting right atrial pressure of 3 mmHg.   Carotid Duplex 05/18/21  Comparison(s): No significant change from prior study. Prior images  reviewed side by side.   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   Left Carotid: Velocities in the left ICA are consistent with a 1-39%  stenosis.   Vertebrals: Bilateral vertebral arteries demonstrate antegrade flow.  Subclavians: Normal flow hemodynamics were seen in bilateral subclavian               arteries.   *See table(s) above for measurements and observations.  Suggest follow up study in 12 months.   [facility] 01/02/19  Patent diagonal stent. Previously placed Ramus drug eluting stent is widely patent. Prox LAD lesion is 75% stenosed. A drug-eluting stent was successfully placed using a STENT SYNERGY DES 3X24. Post intervention, there is a 0% residual stenosis. 1st Diag-1 lesion is 75% stenosed. Balloon angioplasty was performed using a BALLOON SAPPHIRE 2.0X12.[name] Post intervention, there is a 40% residual stenosis. The left ventricular systolic function is normal. LV end diastolic pressure is mildly elevated. LVEDP 15 mm Hg. The left ventricular ejection fraction is 55-65% by visual estimate. There is no aortic valve stenosis.   Successful PCI of the LAD diagonal bifurcation.  Continue medical therapy as well.    Possible same day PCI candidate.   Diagnostic Dominance: Right  Intervention      Recent Labs: 05/29/2021: ALT 33 08/01/2021: Hemoglobin 12.7; NT-Pro BNP 69; Platelets 171 09/09/2021: BUN  15; Creatinine, Ser 0.80; Potassium 4.4; Sodium 141  Recent Lipid Panel    Component Value Date/Time   CHOL 155 10/13/2015 1005   TRIG 186 (H) 10/13/2015 1005   HDL 49 10/13/2015 1005   CHOLHDL 3.2 10/13/2015 1005   VLDL 37 (H) 10/13/2015 1005   LDLCALC 69 10/13/2015 1005     Risk Assessment/Calculations:       Physical Exam:    VS:  BP 122/72   Pulse 67   Ht '5\' 10"'$  (1.778 m)   Wt 236 lb 9.6 oz (107.3 kg)   SpO2 97%   BMI 33.95 kg/m     Wt Readings from Last 3 Encounters:  04/18/22 236 lb 9.6 oz (107.3 kg)  03/14/22 233 lb 3.2 oz (105.8 kg)  02/10/22 229 lb (103.9 kg)     GEN:  Well nourished, well developed in no acute distress HEENT: Normal NECK: No JVD; No carotid bruits CARDIAC: RRR, no murmurs, rubs, gallops RESPIRATORY:  Diminished breath sound right side, clear to auscultation on left without rales, wheezing or rhonchi  ABDOMEN: Soft, non-tender, non-distended MUSCULOSKELETAL:  No edema; No deformity. 2+ pedal pulses, equal bilaterally SKIN: Warm and dry NEUROLOGIC:  Alert and oriented x  3 PSYCHIATRIC:  Normal affect   EKG:  EKG is ordered today. EKG reveals sinus rhythm at 62 bpm with first-degree AV block with PR 250 ms, RBBB, LAFB, no acute change from previous  Diagnoses:    1. DOE (dyspnea on exertion)   2. Coronary artery disease involving native coronary artery of native heart with unstable angina pectoris ([place])   3. Hyperlipidemia LDL goal <70   4. Stenosis of left carotid artery   5. Essential hypertension   6. Palpitations   7. Occlusion of right carotid artery   8. First degree AV block     Assessment and Plan:     DOE: History of DOE and previous thoracentesis due to pleural effusions.  He monitors this and reports worsening breathing concerns to pulmonologist.  He does not feel like he is at the point of needing thoracentesis at this time. As noted above, symptoms may be associated with arrhythmia or CAD. No indication of volume  overload on exam. He denies PND, edema, orthopnea.  CAD with unstable angina: History of DES to diagonal vessel 2016.  CT of PCI with overlapping DES x2 to ramus 2016. Successful PCI of LAD diagonal bifurcation, balloon angioplasty first diagonal lesion, patent stents 12/2018. Concerned for worsening angina in the setting of progressive DOE. Lexiscan Myoview 02/10/22 low risk, no evidence of ischemia or infarction, normal LV function. Patient feels that cardiac catheterization is warranted as prior testing was "normal" and he had coronary blockages. Discussed with Dr. Irish Lack, primary cardiologist. He is agreeable that we proceed with cardiac cath. In the setting of chronic DOE, will get right and left cath.  Risk of cardiac catheterization reviewed with patient and he agrees to proceed.  Continue GDMT including Plavix, Ranexa, Crestor, amlodipine, metoprolol.  Palpitations: Cardiac monitor ordered for elevated HR, palpitations.  Monitor 03/13/2022 revealed normal sinus rhythm with frequent PACs, no pathologic arrhythmias, symptoms correlate to PACs. Advised to start metoprolol succinate 50 mg every evening. Palpitations are quiescent at this time. Having some fatigue. Will continue to monitor in the setting of first degree AV block. No changes today 2/2 upcoming cath. Can consider reducing metoprolol dose in the future if fatigue persists.   Right bundle branch block/First degree AV block: EKG stable. LV function 05/2021 normal at 60-65%. No lightheadedness, presyncope, syncope. Will continue to monitor clinically. Can consider reducing metoprolol if symptoms occur or if AV block worsens.   Carotid artery disease: Known occlusion of right CCA and ICA, left ICA 1 to 39% stenosis on carotid duplex 05/2021. Has repeat carotid u/s scheduled for December 2023.   Hypertension: BP is well-controlled. No medication changes today.   Hyperlipidemia LDL goal < 70: No recent LDL for review. He will return in a few  days for fasting lipid panel. Continue rosuvastatin.   Shared Decision Making/Informed Consent The risks [stroke (1 in 1000), death (1 in 1000), kidney failure [usually temporary] (1 in 500), bleeding (1 in 200), allergic reaction [possibly serious] (1 in 200)], benefits (diagnostic support and management of coronary artery disease) and alternatives of a cardiac catheterization were discussed in detail with Mr. Spisak and he is willing to proceed.   Disposition: Keep your January appointment with Dr. Irish Lack   Medication Adjustments/Labs and Tests Ordered: Current medicines are reviewed at length with the patient today.  Concerns regarding medicines are outlined above.  Orders Placed This Encounter  Procedures   Lipid panel   EKG 12-Lead   No orders of the defined types were  placed in this encounter.   Patient Instructions  Medication Instructions:  Your physician recommends that you continue on your current medications as directed. Please refer to the Current Medication list given to you today.   *If you need a refill on your cardiac medications before your next appointment, please call your pharmacy*   Lab Work: Your physician recommends that you return for lab work on Friday, November 10. Lab is open from 7:15 to 4:30 pm   If you have labs (blood work) drawn today and your tests are completely normal, you will receive your results only by: [place] (if you have MyChart) OR A paper copy in the mail If you have any lab test that is abnormal or we need to change your treatment, we will call you to review the results.   Testing/Procedures: Your physician has requested that you have a cardiac catheterization. Cardiac catheterization is used to diagnose and/or treat various heart conditions. Doctors may recommend this procedure for a number of different reasons. The most common reason is to evaluate chest pain. Chest pain can be a symptom of coronary artery disease (CAD), and  cardiac catheterization can show whether plaque is narrowing or blocking your heart's arteries. This procedure is also used to evaluate the valves, as well as measure the blood flow and oxygen levels in different parts of your heart. For further information please visit [email or web address][facility][email or web address]. Please follow instruction sheet, as given.    Follow-Up: Follow up as scheduled   Other Instructions  [place] A DEPT OF [place] A DEPT OF [facility] [place], [place] [id number] [place] [address] Dept: [phone] Loc: [phone]  MOHD CLEMONS  04/18/2022  You are scheduled for a Cardiac Catheterization on Tuesday, November 14 with Dr. Larae [name].  1. Please arrive at the [facility] (Main Entrance A) at [facility]: [address] [place], [place] [address] at 5:30 AM (This time is two hours before your procedure to ensure your preparation). Free valet parking service is available.   Special note: Every effort is made to have your procedure done on time. Please understand that emergencies sometimes delay scheduled procedures.  2. Diet: Do not eat solid foods after midnight.  The patient may have clear liquids until 5am upon the day of the procedure.    3. Medication instructions in preparation for your procedure:  Hold Lasix the morning of your procedure   On the morning of your procedure, take your Aspirin and  Plavix/Clopidogrel and any morning medicines NOT listed above.  You may use sips of water.  5. Plan for one night stay--bring personal belongings. 6. Bring a current list of your medications and current insurance cards. 7. You MUST have a responsible person to drive you home. 8. Someone MUST be with you the first 24 hours after you arrive home or your discharge will be delayed. 9. Please wear clothes that are easy to get on and off and wear slip-on  shoes.  Thank you for allowing [place] to care for you!   -- [facility] Cardiovascular services       Signed, Emmaline Life, NP  04/18/2022 5:26 PM    [place]

## 2022-04-16 NOTE — Progress Notes (Unsigned)
Cardiology Office Note:    Date:  04/18/2022   ID:  Sherlyn Hay, DOB Aug 17, 1940, MRN [id number]  PCP:  [name], MD   [facility] Providers Cardiologist:  Larae Grooms, MD     Referring MD: [name], MD   Chief Complaint: dyspnea  History of Present Illness:    Luis [name] is a pleasant 81 y.o. male with a hx of CAD, HTN, HLD, carotid stenosis, and adenocarcinoma of colon.   History of CAD to diagonal 08/2014 with ramus intermedius totally occluded at the time.  He eventually underwent CTO PCI with overlapping DES x2 to the ramus 02/2015. Chest pain 05/2016 relieved with ntg, off Ranexa NST 12/2017 normal study with normal LVEF.  Found to have totally occluded right carotid on Dopplers 04/2018 but no other stenosis.  Adenocarcinoma of the colon s/p colectomy.  He underwent cardiac catheterization 01/02/2023 DOE and left arm pain relieved with rest.  Was walking 3 miles daily but had to cut to 1-1/2 miles because of shortness of breath, no chest tightness.  Had gained 10 to 12 pounds.  Received DES to LAD and balloon angioplasty to the diagonal bifurcation.  Plan for aspirin and Plavix for at least 6 months.  [place] - chemotherapy for adenocarcinoma of colon with liver metastasis then surgery once tumor shrunk.  He decided not to have surgery so he was treated with chemotherapy and will have an ablation of the tumor on the liver. Had an episode of chest pain, seen by Dr. Martinique at hospital and troponin was negative, EKG unchanged. Consideration of repeat cath is symptoms persist. Negative work-up in ED 01/2021, hgb was 9.6.  Noted at [facility] that he had used additional diuretics for pleural effusions and shortness of breath.  CXR on 01/22/2021 showed significant improvement in right-sided pleural effusion.   He was last seen in our office by Dr.Varanasi on 08/01/2021 at which time DOE persisted and consideration was given to further ischemic evaluation. He  underwent thoracentesis 08/16/21 with [facility].   He called our office 02/08/2022 with intermittent episodes of elevated HR x2 weeks.  HR previously 110 bpm with walking now up to 145 and taking him longer to recover.  Also notes DOE.  No chest pain.  He was advised to keep his 44-monthfollow-up appointment scheduled for 8/30 and call back if symptoms worsen.  I saw him in the office on 02/08/22 for evaluation of elevated HR. These episodes started prior to starting prednisone which he started for persistent rash today. Reports DOE, feeling tired, and wakes up in the morning with chest pain that resolves after a few minutes. Chest pain occurs at other times as well, however by the time he thinks about it it is gone.  It is not specifically associated with worsening DOE.  DOE most severe when walking up inclines.  Also condones funny feeling in his chest that is difficult to describe. Has persistent pleural effusion for which he has had total of 3 thoracentesis, follows closely with pulmonology. Feels like there is fluid in lungs, but it is not limited his breathing. HR ranging from low 60s to 150s on smart watch. Later in our discussion of potential causes of fluctuating HR, he reports that a few weeks ago he noticed coffee was dripping out of the right side of his mouth with a slight droop noted that resolved on his own. LLeane Call ordered to rule out worsening ischemia on 02/10/22, revealed no  ischemia or infarction, EF 63%.  On 03/13/2022 cardiac monitor revealed NSR with frequent PACs, no atrial fibrillation, no significant arrhythmias.  He was advised to start metoprolol succinate 50 mg each evening.  Today, he reports he continues to have DOE.  Feels that it has worsened since last office visit. This was his only symptom of angina prior to stenting in 2020. Palpitations have improved. He does feel fatigued, could be side effect of metoprolol. Recently cleared by pulmonology, having more time  in between needing thoracentesis. He denies edema,  orthhopnea, PND, and chest pain. Dreams that he is short of breath and wakes up panicking. Remains active working at [employer], no formal exercise. Monitors BP at home which is well-controlled with systolic generally 539-767 mmHg. No bleeding problems.   Past Medical History:  Diagnosis Date   Adenocarcinoma of cecum ([place]) 06/21/2017   Arthritis    "mid back; hands; knees" (02/24/2015)   Basal cell carcinoma    left shoulder; mid chest; right eyelid (02/24/2015)   CAD (coronary artery disease)    a. 08/2014 Cath/PCI: LM nl, LAD 30p, D1 95 (2.25x12 Resolute Integrity DES), LCX small, RI 100 (attempted PCI) - branches fill via L->L collats, RCA dominant, nl, RPDA/PLA nl, EF 60^. b. 02/24/2015 PCI CTO of Ramus DES x2.   Chronic bronchitis ([place])    hx   Diverticulosis 05/2005   Elevated lipase    Fuchs' corneal dystrophy    History of adenomatous polyp of colon 05/2005   8 mm adenoma   History of blood transfusion    "related to some of my surgeries"   Hyperlipidemia    Hypertension    Iron deficiency anemia due to chronic blood loss 06/21/2017   Pneumonia    Prostate cancer ([place]) 2011   S/P seed implant   Sarcoidosis    Thrombocytopenia ([place])    a. Noted on prior labs, unclear of what w/u done.   Trifascicular block     Past Surgical History:  Procedure Laterality Date   BASAL CELL CARCINOMA EXCISION Left    shoulder   CARDIAC CATHETERIZATION N/A 02/24/2015   Procedure: Coronary/Bypass Graft CTO Intervention;  Surgeon: Jettie Booze, MD;  Location: [place] [facility];  Service: Cardiovascular;  Laterality: N/A;   CARDIAC CATHETERIZATION  02/24/2015   Procedure: Coronary/Graft Atherectomy;  Surgeon: Jettie Booze, MD;  Location: [place] [facility];  Service: Cardiovascular;;   CATARACT EXTRACTION W/ INTRAOCULAR LENS  IMPLANT, BILATERAL Bilateral ~ 2011-2012   COLONOSCOPY W/ POLYPECTOMY  06/08/2005 and 10/18/2010   8 mm adenoma  2006, none 2012. Diverticulosis and internal hemorrhoids.   CORNEAL TRANSPLANT Bilateral ~ 2011-2012   "@ same time as cataract OR"   CORONARY ANGIOPLASTY WITH STENT PLACEMENT  08/2014; 02/24/2015   "1 stent + 1 stent"   CORONARY STENT INTERVENTION N/A 01/02/2019   Procedure: CORONARY STENT INTERVENTION;  Surgeon: Jettie Booze, MD;  Location: [place] [facility];  Service: Cardiovascular;  Laterality: N/A;   EYE SURGERY     FINGER SURGERY Right 2014   "reattached middle finger"   INSERTION PROSTATE RADIATION SEED  ~ 2011   IR IMAGING GUIDED PORT INSERTION  03/04/2019   IR RADIOLOGIST EVAL & MGMT  05/26/2020   IR US GUIDE BX ASP/DRAIN  03/04/2019   LAPAROSCOPIC CHOLECYSTECTOMY  2015   LAPAROSCOPIC PARTIAL COLECTOMY N/A 07/26/2017   Procedure: LAPAROSCOPIC PARTIAL COLECTOMY, EXCISION SCROTAL CYST;  Surgeon: Greer Pickerel, MD;  Location: [facility];  Service: General;  Laterality:  N/A;   LEFT HEART CATH AND CORONARY ANGIOGRAPHY N/A 01/02/2019   Procedure: LEFT HEART CATH AND CORONARY ANGIOGRAPHY;  Surgeon: Jettie Booze, MD;  Location: [place] [facility];  Service: Cardiovascular;  Laterality: N/A;   LEFT HEART CATHETERIZATION WITH CORONARY ANGIOGRAM N/A 08/12/2014   Procedure: LEFT HEART CATHETERIZATION WITH CORONARY ANGIOGRAM;  Surgeon: Blane Ohara, MD;  Location: [facility] CATH LAB;  Service: Cardiovascular;  Laterality: N/A;   LYMPH NODE BIOPSY     mid chest   MOHS SURGERY Right ~ 2011   "eyelid; for basal cell"   RADIOLOGY WITH ANESTHESIA N/A 06/30/2020   Procedure: IR WITH ANESTHESIA MICROWAVE ABLATION;  Surgeon: Corrie Mckusick, DO;  Location: [facility];  Service: Anesthesiology;  Laterality: N/A;   THORACENTESIS N/A 02/11/2021   Procedure: Mathews Robinsons;  Surgeon: Lanier Clam, MD;  Location: [facility] ENDOSCOPY;  Service: Pulmonary;  Laterality: N/A;   THORACENTESIS Right 04/04/2021   Procedure: THORACENTESIS;  Surgeon: Candee Furbish, MD;  Location: [facility] ENDOSCOPY;  Service: Pulmonary;   Laterality: Right;   THORACENTESIS Right 08/16/2021   Procedure: THORACENTESIS;  Surgeon: Jacky Kindle, MD;  Location: [facility] ENDOSCOPY;  Service: Pulmonary;  Laterality: Right;   THORACENTESIS Right 11/29/2021   Procedure: THORACENTESIS;  Surgeon: [name], MD;  Location: [facility] ENDOSCOPY;  Service: Pulmonary;  Laterality: Right;   THORACENTESIS N/A 03/17/2022   Procedure: Mathews Robinsons;  Surgeon: Lanier Clam, MD;  Location: [facility] ENDOSCOPY;  Service: Pulmonary;  Laterality: N/A;   THOROCOTOMY WITH LOBECTOMY Right ~ 1991   partial removal right lung   TONSILLECTOMY  ~ 1950    Current Medications: Current Meds  Medication Sig   acetaminophen (TYLENOL) 325 MG tablet Take 650 mg by mouth every 6 (six) hours as needed for moderate pain.   albuterol (VENTOLIN HFA) 108 (90 Base) MCG/ACT inhaler Inhale 1-2 puffs into the lungs every 6 (six) hours as needed for wheezing or shortness of breath.   amLODipine (NORVASC) 5 MG tablet Take 1 tablet (5 mg total) by mouth daily.   cetirizine (ZYRTEC ALLERGY) 10 MG tablet Take 1 tablet (10 mg total) by mouth daily.   Cholecalciferol (VITAMIN D3) 2000 units capsule Take 2,000 Units by mouth daily.   clopidogrel (PLAVIX) 75 MG tablet Take 75 mg by mouth daily.   fluticasone (FLONASE) 50 MCG/ACT nasal spray Place 2 sprays into both nostrils daily.   furosemide (LASIX) 40 MG tablet Take 40 mg by mouth daily.   gabapentin (NEURONTIN) 100 MG capsule Take 100 mg by mouth at bedtime as needed (neuropathy).   ibuprofen (ADVIL) 200 MG tablet Take 400 mg by mouth every 6 (six) hours as needed for pain.   metoprolol succinate (TOPROL XL) 50 MG 24 hr tablet Take 1 tablet (50 mg total) by mouth every evening. Take with or immediately following a meal.   Multiple Vitamins-Minerals (VITRUM SENIOR) TABS Take 1 tablet by mouth daily.   naproxen sodium (ALEVE) 220 MG tablet Take 440 mg by mouth daily as needed (pain).   nitroGLYCERIN (NITROSTAT) 0.4 MG SL tablet PLACE  1 TABLET UNDER THE TONGUE EVERY 5 MINUTES AS NEEDED FOR CHEST PAIN FOR 3 DOSES (Patient taking differently: Place 0.4 mg under the tongue every 5 (five) minutes as needed.)   ranolazine (RANEXA) 500 MG 12 hr tablet TAKE 1 TABLET BY MOUTH TWICE A DAY   rosuvastatin (CRESTOR) 20 MG tablet Take 1 tablet (20 mg total) by mouth daily.   traMADol (ULTRAM) 50 MG tablet Take 2 tablets (100  mg total) by mouth every 6 (six) hours as needed.     Allergies:   Patient has no known allergies.   Social History   Socioeconomic History   Marital status: Married    Spouse name: Not on file   Number of children: 2   Years of education: Not on file   Highest education level: Not on file  Occupational History   Occupation: Unemployed  Tobacco Use   Smoking status: Light Smoker    Packs/day: 0.00    Years: 59.00    Total pack years: 0.00    Types: Cigars, Cigarettes    Last attempt to quit: 02/24/2015    Years since quitting: 7.1   Smokeless tobacco: Never   Tobacco comments:    02/24/2015 "quit cigarettes ~ 2000 ago but smokes cigars about once every two months. ALS 03/14/2022  Vaping Use   Vaping Use: Never used  Substance and Sexual Activity   Alcohol use: Yes    Alcohol/week: 2.0 standard drinks of alcohol    Types: 1 Glasses of wine, 1 Standard drinks or equivalent per week    Comment: Drinks maybe 1-2 drinks a week or less   Drug use: No   Sexual activity: Not on file  Other Topics Concern   Not on file  Social History Narrative   Lives at home wife.     Social Determinants of Health   Financial Resource Strain: Not on file  Food Insecurity: No Food Insecurity (03/10/2022)   Hunger Vital Sign    Worried About Running Out of Food in the Last Year: Never true    Ran Out of Food in the Last Year: Never true  Transportation Needs: No Transportation Needs (03/10/2022)   PRAPARE - Hydrologist (Medical): No    Lack of Transportation (Non-Medical): No  Physical  Activity: Not on file  Stress: Not on file  Social Connections: Not on file     Family History: The patient's family history includes Coronary artery disease (age of onset: 49) in his sister; Heart attack in his sister; Heart disease (age of onset: 27) in his father. There is no history of Colon cancer, Throat cancer, Pancreatic cancer, Prostate cancer, Stroke, or Hypertension.  ROS:   Please see the history of present illness.    + DOE All other systems reviewed and are negative.  Labs/Other Studies Reviewed:    The following studies were reviewed today:  Cardiac monitor 03/13/22   Normal sinus rhythm with frequent PACs and rare PVCs.   Brief runs of PACs, lastig just a few seconds, and not assocaited with symptoms.   Patient symptoms correlated to PACs.   No sustained pathologic arrhythmias.   [place] 02/10/22    The study is normal. The study is low risk.   No ST deviation was noted.   LV perfusion is normal. There is no evidence of ischemia. There is no evidence of infarction.   Left ventricular function is normal. Nuclear stress EF: 63 %. The left ventricular ejection fraction is normal (55-65%). End diastolic cavity size is normal. End systolic cavity size is normal.   Prior study available for comparison from 01/02/2018.   Normal resting and stress perfusion. No ischemia or infarction EF 63%   Echo 05/29/21  1. Left ventricular ejection fraction, by estimation, is 60 to 65%. The  left ventricle has normal function. The left ventricle has no regional  wall motion abnormalities. There is mild concentric left  ventricular  hypertrophy. Left ventricular diastolic  parameters are consistent with Grade I diastolic dysfunction (impaired  relaxation).   2. Right ventricular systolic function is normal. The right ventricular  size is normal.   3. The mitral valve is normal in structure. No evidence of mitral valve  regurgitation. No evidence of mitral stenosis.   4.  The aortic valve is tricuspid. There is mild calcification of the  aortic valve. There is mild thickening of the aortic valve. Aortic valve  regurgitation is not visualized. Aortic valve sclerosis is present, with  no evidence of aortic valve stenosis.   5. There is mild dilatation of the ascending aorta, measuring 40 mm.   6. The inferior vena cava is normal in size with greater than 50%  respiratory variability, suggesting right atrial pressure of 3 mmHg.   Carotid Duplex 05/18/21  Comparison(s): No significant change from prior study. Prior images  reviewed side by side.   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   Left Carotid: Velocities in the left ICA are consistent with a 1-39%  stenosis.   Vertebrals: Bilateral vertebral arteries demonstrate antegrade flow.  Subclavians: Normal flow hemodynamics were seen in bilateral subclavian               arteries.   *See table(s) above for measurements and observations.  Suggest follow up study in 12 months.   LHC 01/02/19  Patent diagonal stent. Previously placed Ramus drug eluting stent is widely patent. Prox LAD lesion is 75% stenosed. A drug-eluting stent was successfully placed using a STENT SYNERGY DES 3X24. Post intervention, there is a 0% residual stenosis. 1st Diag-1 lesion is 75% stenosed. Balloon angioplasty was performed using a BALLOON SAPPHIRE 2.0X12.[name] Post intervention, there is a 40% residual stenosis. The left ventricular systolic function is normal. LV end diastolic pressure is mildly elevated. LVEDP 15 mm Hg. The left ventricular ejection fraction is 55-65% by visual estimate. There is no aortic valve stenosis.   Successful PCI of the LAD diagonal bifurcation.  Continue medical therapy as well.    Possible same day PCI candidate.   Diagnostic Dominance: Right  Intervention      Recent Labs: 05/29/2021: ALT 33 08/01/2021: Hemoglobin 12.7; NT-Pro BNP 69; Platelets 171 09/09/2021: BUN  15; Creatinine, Ser 0.80; Potassium 4.4; Sodium 141  Recent Lipid Panel    Component Value Date/Time   CHOL 155 10/13/2015 1005   TRIG 186 (H) 10/13/2015 1005   HDL 49 10/13/2015 1005   CHOLHDL 3.2 10/13/2015 1005   VLDL 37 (H) 10/13/2015 1005   LDLCALC 69 10/13/2015 1005     Risk Assessment/Calculations:       Physical Exam:    VS:  BP 122/72   Pulse 67   Ht '5\' 10"'$  (1.778 m)   Wt 236 lb 9.6 oz (107.3 kg)   SpO2 97%   BMI 33.95 kg/m     Wt Readings from Last 3 Encounters:  04/18/22 236 lb 9.6 oz (107.3 kg)  03/14/22 233 lb 3.2 oz (105.8 kg)  02/10/22 229 lb (103.9 kg)     GEN:  Well nourished, well developed in no acute distress HEENT: Normal NECK: No JVD; No carotid bruits CARDIAC: RRR, no murmurs, rubs, gallops RESPIRATORY:  Diminished breath sound right side, clear to auscultation on left without rales, wheezing or rhonchi  ABDOMEN: Soft, non-tender, non-distended MUSCULOSKELETAL:  No edema; No deformity. 2+ pedal pulses, equal bilaterally SKIN: Warm and dry NEUROLOGIC:  Alert and oriented x  3 PSYCHIATRIC:  Normal affect   EKG:  EKG is ordered today. EKG reveals sinus rhythm at 62 bpm with first-degree AV block with PR 250 ms, RBBB, LAFB, no acute change from previous  Diagnoses:    1. DOE (dyspnea on exertion)   2. Coronary artery disease involving native coronary artery of native heart with unstable angina pectoris ([place])   3. Hyperlipidemia LDL goal <70   4. Stenosis of left carotid artery   5. Essential hypertension   6. Palpitations   7. Occlusion of right carotid artery   8. First degree AV block     Assessment and Plan:     DOE: History of DOE and previous thoracentesis due to pleural effusions.  He monitors this and reports worsening breathing concerns to pulmonologist.  He does not feel like he is at the point of needing thoracentesis at this time. As noted above, symptoms may be associated with arrhythmia or CAD. No indication of volume  overload on exam. He denies PND, edema, orthopnea.  CAD with unstable angina: History of DES to diagonal vessel 2016.  CT of PCI with overlapping DES x2 to ramus 2016. Successful PCI of LAD diagonal bifurcation, balloon angioplasty first diagonal lesion, patent stents 12/2018. Concerned for worsening angina in the setting of progressive DOE. Lexiscan Myoview 02/10/22 low risk, no evidence of ischemia or infarction, normal LV function. Patient feels that cardiac catheterization is warranted as prior testing was "normal" and he had coronary blockages. Discussed with Dr. Irish Lack, primary cardiologist. He is agreeable that we proceed with cardiac cath. In the setting of chronic DOE, will get right and left cath.  Risk of cardiac catheterization reviewed with patient and he agrees to proceed.  Continue GDMT including Plavix, Ranexa, Crestor, amlodipine, metoprolol.  Palpitations: Cardiac monitor ordered for elevated HR, palpitations.  Monitor 03/13/2022 revealed normal sinus rhythm with frequent PACs, no pathologic arrhythmias, symptoms correlate to PACs. Advised to start metoprolol succinate 50 mg every evening. Palpitations are quiescent at this time. Having some fatigue. Will continue to monitor in the setting of first degree AV block. No changes today 2/2 upcoming cath. Can consider reducing metoprolol dose in the future if fatigue persists.   Right bundle branch block/First degree AV block: EKG stable. LV function 05/2021 normal at 60-65%. No lightheadedness, presyncope, syncope. Will continue to monitor clinically. Can consider reducing metoprolol if symptoms occur or if AV block worsens.   Carotid artery disease: Known occlusion of right CCA and ICA, left ICA 1 to 39% stenosis on carotid duplex 05/2021. Has repeat carotid u/s scheduled for December 2023.   Hypertension: BP is well-controlled. No medication changes today.   Hyperlipidemia LDL goal < 70: No recent LDL for review. He will return in a few  days for fasting lipid panel. Continue rosuvastatin.   Shared Decision Making/Informed Consent The risks [stroke (1 in 1000), death (1 in 1000), kidney failure [usually temporary] (1 in 500), bleeding (1 in 200), allergic reaction [possibly serious] (1 in 200)], benefits (diagnostic support and management of coronary artery disease) and alternatives of a cardiac catheterization were discussed in detail with Luis [name] and he is willing to proceed.   Disposition: Keep your January appointment with Dr. Irish Lack   Medication Adjustments/Labs and Tests Ordered: Current medicines are reviewed at length with the patient today.  Concerns regarding medicines are outlined above.  Orders Placed This Encounter  Procedures   Lipid panel   EKG 12-Lead   No orders of the defined types were  placed in this encounter.   Patient Instructions  Medication Instructions:  Your physician recommends that you continue on your current medications as directed. Please refer to the Current Medication list given to you today.   *If you need a refill on your cardiac medications before your next appointment, please call your pharmacy*   Lab Work: Your physician recommends that you return for lab work on Friday, November 10. Lab is open from 7:15 to 4:30 pm   If you have labs (blood work) drawn today and your tests are completely normal, you will receive your results only by: [place] (if you have MyChart) OR A paper copy in the mail If you have any lab test that is abnormal or we need to change your treatment, we will call you to review the results.   Testing/Procedures: Your physician has requested that you have a cardiac catheterization. Cardiac catheterization is used to diagnose and/or treat various heart conditions. Doctors may recommend this procedure for a number of different reasons. The most common reason is to evaluate chest pain. Chest pain can be a symptom of coronary artery disease (CAD), and  cardiac catheterization can show whether plaque is narrowing or blocking your heart's arteries. This procedure is also used to evaluate the valves, as well as measure the blood flow and oxygen levels in different parts of your heart. For further information please visit [email or web address][facility][email or web address]. Please follow instruction sheet, as given.    Follow-Up: Follow up as scheduled   Other Instructions  [place] A DEPT OF [place] A DEPT OF [facility] [place], [place] [id number] [place] [address] Dept: [phone] Loc: [phone]  STEPHONE GUM  04/18/2022  You are scheduled for a Cardiac Catheterization on Tuesday, November 14 with Dr. Larae Grooms.  1. Please arrive at the [facility] (Main Entrance A) at [facility]: [address] [place], [place] [address] at 5:30 AM (This time is two hours before your procedure to ensure your preparation). Free valet parking service is available.   Special note: Every effort is made to have your procedure done on time. Please understand that emergencies sometimes delay scheduled procedures.  2. Diet: Do not eat solid foods after midnight.  The patient may have clear liquids until 5am upon the day of the procedure.    3. Medication instructions in preparation for your procedure:  Hold Lasix the morning of your procedure   On the morning of your procedure, take your Aspirin and  Plavix/Clopidogrel and any morning medicines NOT listed above.  You may use sips of water.  5. Plan for one night stay--bring personal belongings. 6. Bring a current list of your medications and current insurance cards. 7. You MUST have a responsible person to drive you home. 8. Someone MUST be with you the first 24 hours after you arrive home or your discharge will be delayed. 9. Please wear clothes that are easy to get on and off and wear slip-on  shoes.  Thank you for allowing [place] to care for you!   -- [facility] Invasive Cardiovascular services       Signed, Emmaline Life, NP  04/18/2022 5:26 PM    [place]

## 2022-04-17 DIAGNOSIS — C189 Malignant neoplasm of colon, unspecified: Secondary | ICD-10-CM | POA: Diagnosis not present

## 2022-04-17 DIAGNOSIS — Z79899 Other long term (current) drug therapy: Secondary | ICD-10-CM | POA: Diagnosis not present

## 2022-04-17 DIAGNOSIS — R0609 Other forms of dyspnea: Secondary | ICD-10-CM | POA: Diagnosis not present

## 2022-04-17 DIAGNOSIS — C19 Malignant neoplasm of rectosigmoid junction: Secondary | ICD-10-CM | POA: Diagnosis not present

## 2022-04-17 DIAGNOSIS — C787 Secondary malignant neoplasm of liver and intrahepatic bile duct: Secondary | ICD-10-CM | POA: Diagnosis not present

## 2022-04-17 DIAGNOSIS — Z5111 Encounter for antineoplastic chemotherapy: Secondary | ICD-10-CM | POA: Diagnosis not present

## 2022-04-17 DIAGNOSIS — Z8546 Personal history of malignant neoplasm of prostate: Secondary | ICD-10-CM | POA: Diagnosis not present

## 2022-04-17 DIAGNOSIS — J90 Pleural effusion, not elsewhere classified: Secondary | ICD-10-CM | POA: Diagnosis not present

## 2022-04-18 ENCOUNTER — Ambulatory Visit: Payer: Medicare PPO | Attending: Nurse Practitioner | Admitting: Nurse Practitioner

## 2022-04-18 ENCOUNTER — Encounter: Payer: Self-pay | Admitting: Nurse Practitioner

## 2022-04-18 VITALS — BP 122/72 | HR 67 | Ht 70.0 in | Wt 236.6 lb

## 2022-04-18 DIAGNOSIS — I6521 Occlusion and stenosis of right carotid artery: Secondary | ICD-10-CM

## 2022-04-18 DIAGNOSIS — R0609 Other forms of dyspnea: Secondary | ICD-10-CM

## 2022-04-18 DIAGNOSIS — I6522 Occlusion and stenosis of left carotid artery: Secondary | ICD-10-CM | POA: Diagnosis not present

## 2022-04-18 DIAGNOSIS — E785 Hyperlipidemia, unspecified: Secondary | ICD-10-CM | POA: Diagnosis not present

## 2022-04-18 DIAGNOSIS — I10 Essential (primary) hypertension: Secondary | ICD-10-CM

## 2022-04-18 DIAGNOSIS — I25110 Atherosclerotic heart disease of native coronary artery with unstable angina pectoris: Secondary | ICD-10-CM

## 2022-04-18 DIAGNOSIS — R002 Palpitations: Secondary | ICD-10-CM

## 2022-04-18 DIAGNOSIS — I440 Atrioventricular block, first degree: Secondary | ICD-10-CM

## 2022-04-18 NOTE — Patient Instructions (Addendum)
Medication Instructions:  Your physician recommends that you continue on your current medications as directed. Please refer to the Current Medication list given to you today.   *If you need a refill on your cardiac medications before your next appointment, please call your pharmacy*   Lab Work: Your physician recommends that you return for lab work on Friday, November 10. Lab is open from 7:15 to 4:30 pm   If you have labs (blood work) drawn today and your tests are completely normal, you will receive your results only by: [place] (if you have MyChart) OR A paper copy in the mail If you have any lab test that is abnormal or we need to change your treatment, we will call you to review the results.   Testing/Procedures: Your physician has requested that you have a cardiac catheterization. Cardiac catheterization is used to diagnose and/or treat various heart conditions. Doctors may recommend this procedure for a number of different reasons. The most common reason is to evaluate chest pain. Chest pain can be a symptom of coronary artery disease (CAD), and cardiac catheterization can show whether plaque is narrowing or blocking your heart's arteries. This procedure is also used to evaluate the valves, as well as measure the blood flow and oxygen levels in different parts of your heart. For further information please visit [email or web address]. Please follow instruction sheet, as given.    Follow-Up: Follow up as scheduled   Other Instructions  [place] [facility] [place] [facility] [place], [place] [id number] [place] [address] Dept: [phone] Loc: [phone]  KAMARE CASPERS  04/18/2022  You are scheduled for a Cardiac Catheterization on Tuesday, November 14 with Dr. Larae Grooms.  1. Please arrive at the [facility] (Main Entrance A) at [facility]: [address] [place], [place] [address] at 5:30 AM (This time is two hours before your procedure to ensure your preparation). Free valet parking service is available.   Special note: Every effort is made to have your procedure done on time. Please understand that emergencies sometimes delay scheduled procedures.  2. Diet: Do not eat solid foods after midnight.  The patient may have clear liquids until 5am upon the day of the procedure.    3. Medication instructions in preparation for your procedure:  Hold Lasix the morning of your procedure   On the morning of your procedure, take your Aspirin and  Plavix/Clopidogrel and any morning medicines NOT listed above.  You may use sips of water.  5. Plan for one night stay--bring personal belongings. 6. Bring a current list of your medications and current insurance cards. 7. You MUST have a responsible person to drive you home. 8. Someone MUST be with you the first 24 hours after you arrive home or your discharge will be delayed. 9. Please wear clothes that are easy to get on and off and wear slip-on shoes.  Thank you for allowing [place] to care for you!   -- [facility]

## 2022-04-21 ENCOUNTER — Inpatient Hospital Stay: Payer: Medicare PPO | Attending: Oncology | Admitting: Oncology

## 2022-04-21 ENCOUNTER — Ambulatory Visit: Payer: Medicare PPO

## 2022-04-21 ENCOUNTER — Encounter: Payer: Self-pay | Admitting: Oncology

## 2022-04-21 VITALS — BP 145/60 | HR 62 | Temp 98.1°F | Resp 18 | Ht 70.0 in | Wt 235.8 lb

## 2022-04-21 DIAGNOSIS — R0600 Dyspnea, unspecified: Secondary | ICD-10-CM | POA: Diagnosis not present

## 2022-04-21 DIAGNOSIS — C787 Secondary malignant neoplasm of liver and intrahepatic bile duct: Secondary | ICD-10-CM | POA: Diagnosis not present

## 2022-04-21 DIAGNOSIS — C180 Malignant neoplasm of cecum: Secondary | ICD-10-CM | POA: Insufficient documentation

## 2022-04-21 NOTE — Progress Notes (Signed)
[place] OFFICE PROGRESS NOTE   Diagnosis: Colon cancer  INTERVAL HISTORY:   Luis [name] returns for a scheduled visit.  We have not seen him for several months.  He is followed closely by Dr. Reynaldo Minium at [facility].  He is currently being treated with 5-FU and FUDR.  He reports dyspnea.  He is being evaluated by cardiology and is scheduled for cardiac catheterization next week.  No chest pain. He continues to work.  Objective:  Vital signs in last 24 hours:  Blood pressure (!) 145/60, pulse 62, temperature 98.1 F (36.7 C), temperature source Oral, resp. rate 18, height _0  (1.778 m), weight 235 lb 12.8 oz (107 kg), SpO2 100 %.   Resp: Lungs clear bilaterally Cardio: Distant heart sounds, regular rate and rhythm GI: Mildly distended, venous engorgement over the abdominal wall, no hepatosplenomegaly Vascular: No leg edema    Lab Results:  Lab Results  Component Value Date   WBC 4.5 08/01/2021   HGB 12.7 (L) 08/01/2021   HCT 40.6 08/01/2021   MCV 84 08/01/2021   PLT 171 08/01/2021   NEUTROABS 4.9 05/29/2021    CMP  Lab Results  Component Value Date   NA 141 09/09/2021   K 4.4 09/09/2021   CL 99 09/09/2021   CO2 24 09/09/2021   GLUCOSE 100 (H) 09/09/2021   BUN 15 09/09/2021   CREATININE 0.80 09/09/2021   CALCIUM 9.5 09/09/2021   PROT 6.6 05/29/2021   ALBUMIN 4.0 05/29/2021   AST 50 (H) 05/29/2021   ALT 33 05/29/2021   ALKPHOS 114 05/29/2021   BILITOT 0.7 05/29/2021   GFRNONAA >60 05/29/2021   GFRAA >60 03/10/2020    Lab Results  Component Value Date   CEA1 2.10 04/14/2020   CEA 1.3 06/20/2017   Medications: I have reviewed the patient's current medications.   Assessment/Plan: Adenocarcinoma of the cecum, stage IIA (T3N0), status post a right colectomy 07/26/2017 0/16 lymph nodes positive, no lymphovascular invasion, perineural invasion present MSI-stable, no loss of mismatch repair protein expression Surveillance colonoscopy  07/26/2018-no polyps found Elevated CEA June 2020, persistently elevated August 2020 CTs 02/07/2019- bilateral liver metastases, no extrahepatic metastatic disease, changes of sarcoidosis in the chest Biopsy liver lesion 03/04/2019-metastatic adenocarcinoma, with morphology consistent with metastatic colonic adenocarcinoma, RAS WT Cycle 1 FOLFOX 03/12/2019 Cycle 2 FOLFOX 03/26/2019, oxaliplatin dose reduced secondary to thrombocytopenia Cycle 3 FOLFOX 04/09/2019, oxaliplatin further dose reduced and aspirin held secondary to thrombocytopenia Cycle 4 FOLFOX 04/23/2019 Cycle 5 FOLFOX 05/07/2019 CTs 05/23/2019-decrease in liver lesions.  No new or progressive findings. Cycle 6 FOLFOX 05/27/2019 Cycle 7 FOLFOX 06/14/2019 Cycle 8 FOLFOX 07/02/2019-Udenyca held Cycle 9 FOLFOX 07/16/2019-Udenyca held Cycle 10 FOLFOX 07/30/2019-Udenyca held CT abdomen/pelvis 08/13/2019-mild improvement in previously noted liver lesions MRI abdomen 08/13/2019-3 liver lesions  Cycle 1 FOLFIRI/Avastin 09/17/2019 Cycle 2 FOLFIRI/Avastin 10/01/2019 Cycle 3 FOLFIRI/Avastin 10/15/2019 Cycle 4 FOLFIRI/Avastin 11/05/2019 Cycle 5 FOLFIRI/Avastin 11/19/2019 CTs 11/27/2019-stable liver lesions; no new liver lesions.  No other evidence of metastatic disease in the abdomen or pelvis. Cycle 1 FOLFIRI/Panitumumab 12/03/2019 Cycle 2 FOLFIRI/Panitumumab 12/17/2019 (Panitumumab dose reduced due to rash) Cycle 3 FOLFIRI/Panitumumab 01/01/2020 Cycle 4 FOLFIRI/Panitumumab 01/14/2020 Cycle 5 FOLFIRI/Panitumumab 01/28/2020 CT abdomen/pelvis 02/06/2020-decreased size of right hepatic lesions, subtle lesion in the lateral left hepatic lobe-not clearly identified, no evidence of disease progression Cycle 6 FOLFIRI/Panitumumab 02/11/2020 Cycle 7 FOLFIRI/Panitumumab 03/10/2020 Cycle 8 FOLFIRI/Panitumumab 03/26/2020 Cycle 9 FOLFIRI/Panitumumab 04/14/2020 CT abdomen/pelvis 04/26/2020-mild enlargement of 2 liver lesions on initial report, repeat review with radiology  consistent  with stable disease Cycle 10 FOLFIRI/Panitumumab 04/29/2020 MRI liver 05/19/2020-right hepatic lobe lesions diminished in size when compared to the study of March 2021.  Lesion in hepatic subsegment VI with very limited assessment as well due to adjacent bowel. 05/20/2020 referred to interventional radiology to consider ablation of liver lesions CT abdomen 06/30/2020 prior to planned ablation procedure, enlargement of segment 7 liver lesion and segment 6 liver lesion.  Interval recurrence of segment 2 lesion measuring 4.5 cm, ablation aborted 07/23/2020-diagnostic laparoscopy, robotic assisted insertion of hepatic infusion pump, portal lymphadenectomy-granulomatous inflammation compatible with sarcoidosis, core tumor biopsy segment 6, infiltrating adenocarcinoma 08/02/2020-cycle 1 FUDR at 50% dose 08/16/2020-FOLFIRI/panitumumab CTs 10/25/2020-significantly decreased size of hepatic metastases 10/25/2020 cycle 6 FOLFIRI/Panitumumab, FUDR # 4 11/09/2020-cycle 7 FOLFIRI/panitumumab 01/06/2021-diagnostic laparoscopy, partial hepatectomy-segment 2, segment 6, microwave ablation segment 7-3 tumors identified, segment 2 and 6 tumors resected-adenocarcinoma with negative margins, segment 2 lesion with less than 1 mm hepatic parenchymal margin 03/28/2021-CTs with slight increase in right pleural effusion, CEA 1.5 05/16/2021-increasing ill-defined lesion in segment 7, MRI-posttreatment change of the liver with heterogenous hypoenhancement adjacent to hepatic segment 7 ablation site suspicious for local recurrence January 2023-SBRT to segment 7 ablation cavity 08/08/2021-stable posttreatment changes in liver, segment 7 ablation cavity unchanged, no new metastatic disease, increasing pleural effusion 10/17/2021, CT-interval increase in left hepatic lobe segment 3 liver lesion measuring up to 2 cm 11/17/2021-completed 60 Gray in 8 fractions to segment 3 liver lesion 01/02/2022-CTs-stable 03/17/2022-thoracentesis right  pleural effusion 03/27/2022-CTs-increased size of hypoattenuating lesion at the deep margin of the previously treated lesion right hepatic lobe 04/05/2019-MRI with increased size of a heterogenous lesion at the deep margin of a previously treated right hepatic lobe lesion, small right pleural effusion 04/17/2022-5-FU and FUDR 05/07/2022 FUDR No. 1 at 100% plus 5-FU  History of colon polyps Microcytic anemia January 2019 Sarcoidosis Coronary artery disease Port-A-Cath placement, interventional radiology, 03/04/2019 Thrombocytopenia, mild thrombocytopenia predating chemotherapy Oxaliplatin neuropathy Dyspnea on exertion-CT chest 02/25/2020 negative for PE, infection, pneumonitis; scheduled for PFTs 03/10/2020, followed by pulmonary      Disposition: Luis [name] has metastatic colon cancer.  He is currently being treated with 5-FU and FUDR at [facility] for management of progressive disease at a previously treated right liver lesion. He will continue evaluation of dyspnea with cardiology and pulmonary medicine.  Luis [name] will return for an office visit in 4 months.  I am available to see him in the interim as needed.  Luis Coder, MD  04/21/2022  8:18 AM

## 2022-04-24 ENCOUNTER — Ambulatory Visit: Payer: Medicare PPO | Attending: Nurse Practitioner

## 2022-04-24 ENCOUNTER — Telehealth: Payer: Self-pay | Admitting: *Deleted

## 2022-04-24 DIAGNOSIS — I25110 Atherosclerotic heart disease of native coronary artery with unstable angina pectoris: Secondary | ICD-10-CM | POA: Diagnosis not present

## 2022-04-24 DIAGNOSIS — E785 Hyperlipidemia, unspecified: Secondary | ICD-10-CM | POA: Diagnosis not present

## 2022-04-24 LAB — LIPID PANEL
Chol/HDL Ratio: 2.4 ratio (ref 0.0–5.0)
Cholesterol, Total: 176 mg/dL (ref 100–199)
HDL: 72 mg/dL (ref >39)
LDL Chol Calc (NIH): 90 mg/dL (ref 0–99)
Triglycerides: 75 mg/dL (ref 0–149)
VLDL Cholesterol Cal: 14 mg/dL (ref 5–40)

## 2022-04-24 NOTE — Telephone Encounter (Signed)
Cardiac Catheterization scheduled at [facility] for: Tuesday April 25, 2022 7:30 AM Arrival time and place: [place] Entrance A at: 5:30 AM  Nothing to eat after midnight prior to procedure, clear liquids until 5 AM day of procedure.  Medication instructions: -Hold:  Lasix-AM of procedure  -Except hold medications usual morning medications can be taken with sips of water including aspirin 81 mg and Plavix 75 mg.  Confirmed patient has responsible adult to drive home post procedure and be with patient first 24 hours after arriving home.  Patient reports no new symptoms concerning for COVID-19 in the past 10 days.  Reviewed procedure instructions with patient.

## 2022-04-25 ENCOUNTER — Encounter (HOSPITAL_COMMUNITY): Payer: Self-pay | Admitting: Interventional Cardiology

## 2022-04-25 ENCOUNTER — Ambulatory Visit (HOSPITAL_COMMUNITY)
Admission: RE | Admit: 2022-04-25 | Discharge: 2022-04-25 | Disposition: A | Discharge status: Home / Self Care | Payer: Medicare PPO | Attending: Interventional Cardiology | Admitting: Interventional Cardiology

## 2022-04-25 ENCOUNTER — Other Ambulatory Visit: Payer: Self-pay

## 2022-04-25 ENCOUNTER — Encounter (HOSPITAL_COMMUNITY)
Admission: RE | Disposition: A | Discharge status: Home / Self Care | Payer: Self-pay | Source: Home / Self Care | Attending: Interventional Cardiology

## 2022-04-25 DIAGNOSIS — R0609 Other forms of dyspnea: Secondary | ICD-10-CM

## 2022-04-25 DIAGNOSIS — R002 Palpitations: Secondary | ICD-10-CM | POA: Insufficient documentation

## 2022-04-25 DIAGNOSIS — I440 Atrioventricular block, first degree: Secondary | ICD-10-CM | POA: Diagnosis not present

## 2022-04-25 DIAGNOSIS — E785 Hyperlipidemia, unspecified: Secondary | ICD-10-CM | POA: Insufficient documentation

## 2022-04-25 DIAGNOSIS — Z85038 Personal history of other malignant neoplasm of large intestine: Secondary | ICD-10-CM | POA: Insufficient documentation

## 2022-04-25 DIAGNOSIS — I2584 Coronary atherosclerosis due to calcified coronary lesion: Secondary | ICD-10-CM | POA: Insufficient documentation

## 2022-04-25 DIAGNOSIS — I25118 Atherosclerotic heart disease of native coronary artery with other forms of angina pectoris: Secondary | ICD-10-CM | POA: Diagnosis not present

## 2022-04-25 DIAGNOSIS — Z87891 Personal history of nicotine dependence: Secondary | ICD-10-CM | POA: Insufficient documentation

## 2022-04-25 DIAGNOSIS — I6523 Occlusion and stenosis of bilateral carotid arteries: Secondary | ICD-10-CM | POA: Diagnosis not present

## 2022-04-25 DIAGNOSIS — Z955 Presence of coronary angioplasty implant and graft: Secondary | ICD-10-CM | POA: Diagnosis not present

## 2022-04-25 DIAGNOSIS — I25110 Atherosclerotic heart disease of native coronary artery with unstable angina pectoris: Secondary | ICD-10-CM

## 2022-04-25 DIAGNOSIS — I10 Essential (primary) hypertension: Secondary | ICD-10-CM | POA: Insufficient documentation

## 2022-04-25 HISTORY — PX: RIGHT/LEFT HEART CATH AND CORONARY ANGIOGRAPHY: CATH118266

## 2022-04-25 LAB — POCT I-STAT 7, (LYTES, BLD GAS, ICA,H+H)
Acid-Base Excess: 0 mmol/L (ref 0.0–2.0)
Bicarbonate: 25.2 mmol/L (ref 20.0–28.0)
Calcium, Ion: 1.22 mmol/L (ref 1.15–1.40)
HCT: 35 % — ABNORMAL LOW (ref 39.0–52.0)
Hemoglobin: 11.9 g/dL — ABNORMAL LOW (ref 13.0–17.0)
O2 Saturation: 94 %
Potassium: 3.9 mmol/L (ref 3.5–5.1)
Sodium: 136 mmol/L (ref 135–145)
TCO2: 26 mmol/L (ref 22–32)
pCO2 arterial: 42.1 mmHg (ref 32–48)
pH, Arterial: 7.385 (ref 7.35–7.45)
pO2, Arterial: 73 mmHg — ABNORMAL LOW (ref 83–108)

## 2022-04-25 LAB — POCT I-STAT EG7
Acid-Base Excess: 1 mmol/L (ref 0.0–2.0)
Acid-Base Excess: 1 mmol/L (ref 0.0–2.0)
Bicarbonate: 26.6 mmol/L (ref 20.0–28.0)
Bicarbonate: 26.8 mmol/L (ref 20.0–28.0)
Calcium, Ion: 1.22 mmol/L (ref 1.15–1.40)
Calcium, Ion: 1.23 mmol/L (ref 1.15–1.40)
HCT: 34 % — ABNORMAL LOW (ref 39.0–52.0)
HCT: 35 % — ABNORMAL LOW (ref 39.0–52.0)
Hemoglobin: 11.6 g/dL — ABNORMAL LOW (ref 13.0–17.0)
Hemoglobin: 11.9 g/dL — ABNORMAL LOW (ref 13.0–17.0)
O2 Saturation: 73 %
O2 Saturation: 75 %
Potassium: 3.8 mmol/L (ref 3.5–5.1)
Potassium: 3.8 mmol/L (ref 3.5–5.1)
Sodium: 137 mmol/L (ref 135–145)
Sodium: 137 mmol/L (ref 135–145)
TCO2: 28 mmol/L (ref 22–32)
TCO2: 28 mmol/L (ref 22–32)
pCO2, Ven: 47.3 mmHg (ref 44–60)
pCO2, Ven: 47.5 mmHg (ref 44–60)
pH, Ven: 7.358 (ref 7.25–7.43)
pH, Ven: 7.36 (ref 7.25–7.43)
pO2, Ven: 41 mmHg (ref 32–45)
pO2, Ven: 42 mmHg (ref 32–45)

## 2022-04-25 SURGERY — RIGHT/LEFT HEART CATH AND CORONARY ANGIOGRAPHY
Anesthesia: LOCAL

## 2022-04-25 MED ORDER — SODIUM CHLORIDE 0.9 % WEIGHT BASED INFUSION
3.0000 mL/kg/h | INTRAVENOUS | Status: AC
  Administered 2022-04-25: 3 mL/kg/h via INTRAVENOUS

## 2022-04-25 MED ORDER — SODIUM CHLORIDE 0.9 % IV SOLN: 250.0000 mL | INTRAVENOUS | Status: DC | PRN

## 2022-04-25 MED ORDER — ACETAMINOPHEN 325 MG PO TABS: 650.0000 mg | ORAL_TABLET | ORAL | Status: DC | PRN

## 2022-04-25 MED ORDER — SODIUM CHLORIDE 0.9% FLUSH: 3.0000 mL | Freq: Two times a day (BID) | INTRAVENOUS | Status: DC

## 2022-04-25 MED ORDER — ONDANSETRON HCL 4 MG/2ML IJ SOLN: 4.0000 mg | Freq: Four times a day (QID) | INTRAMUSCULAR | Status: DC | PRN

## 2022-04-25 MED ORDER — FENTANYL CITRATE (PF) 100 MCG/2ML IJ SOLN
INTRAMUSCULAR | Status: DC | PRN
  Administered 2022-04-25: 25 ug via INTRAVENOUS

## 2022-04-25 MED ORDER — FENTANYL CITRATE (PF) 100 MCG/2ML IJ SOLN
INTRAMUSCULAR | Status: AC
  Filled 2022-04-25: qty 2

## 2022-04-25 MED ORDER — LIDOCAINE HCL (PF) 1 % IJ SOLN
INTRAMUSCULAR | Status: AC
  Filled 2022-04-25: qty 30

## 2022-04-25 MED ORDER — ASPIRIN 81 MG PO CHEW: 81.0000 mg | CHEWABLE_TABLET | ORAL | Status: DC

## 2022-04-25 MED ORDER — HEPARIN (PORCINE) IN NACL 1000-0.9 UT/500ML-% IV SOLN
INTRAVENOUS | Status: DC | PRN
  Administered 2022-04-25 (×2): 500 mL

## 2022-04-25 MED ORDER — HYDRALAZINE HCL 20 MG/ML IJ SOLN: 10.0000 mg | INTRAMUSCULAR | Status: DC | PRN

## 2022-04-25 MED ORDER — IOHEXOL 350 MG/ML SOLN
INTRAVENOUS | Status: DC | PRN
  Administered 2022-04-25: 70 mL

## 2022-04-25 MED ORDER — MIDAZOLAM HCL 2 MG/2ML IJ SOLN
INTRAMUSCULAR | Status: AC
  Filled 2022-04-25: qty 2

## 2022-04-25 MED ORDER — LABETALOL HCL 5 MG/ML IV SOLN: 10.0000 mg | INTRAVENOUS | Status: DC | PRN

## 2022-04-25 MED ORDER — SODIUM CHLORIDE 0.9 % WEIGHT BASED INFUSION: 1.0000 mL/kg/h | INTRAVENOUS | Status: DC

## 2022-04-25 MED ORDER — MIDAZOLAM HCL 2 MG/2ML IJ SOLN
INTRAMUSCULAR | Status: DC | PRN
  Administered 2022-04-25: 2 mg via INTRAVENOUS

## 2022-04-25 MED ORDER — HEPARIN SODIUM (PORCINE) 1000 UNIT/ML IJ SOLN
INTRAMUSCULAR | Status: AC
  Filled 2022-04-25: qty 10

## 2022-04-25 MED ORDER — HEPARIN (PORCINE) IN NACL 1000-0.9 UT/500ML-% IV SOLN
INTRAVENOUS | Status: AC
  Filled 2022-04-25: qty 1000

## 2022-04-25 MED ORDER — VERAPAMIL HCL 2.5 MG/ML IV SOLN
INTRAVENOUS | Status: AC
  Filled 2022-04-25: qty 2

## 2022-04-25 MED ORDER — VERAPAMIL HCL 2.5 MG/ML IV SOLN
INTRAVENOUS | Status: DC | PRN
  Administered 2022-04-25: 10 mL via INTRA_ARTERIAL

## 2022-04-25 MED ORDER — HEPARIN SODIUM (PORCINE) 1000 UNIT/ML IJ SOLN
INTRAMUSCULAR | Status: DC | PRN
  Administered 2022-04-25: 5000 [IU] via INTRAVENOUS

## 2022-04-25 MED ORDER — SODIUM CHLORIDE 0.9% FLUSH: 3.0000 mL | INTRAVENOUS | Status: DC | PRN

## 2022-04-25 MED ORDER — VERAPAMIL HCL 2.5 MG/ML IV SOLN: INTRAVENOUS | Status: DC | PRN

## 2022-04-25 MED ORDER — LIDOCAINE HCL (PF) 1 % IJ SOLN
INTRAMUSCULAR | Status: DC | PRN
  Administered 2022-04-25 (×2): 2 mL

## 2022-04-25 SURGICAL SUPPLY — 13 items
CATH 5FR JL3.5 JR4 ANG PIG MP (CATHETERS) IMPLANT
CATH SWAN GANZ 7F STRAIGHT (CATHETERS) IMPLANT
DEVICE RAD COMP TR BAND LRG (VASCULAR PRODUCTS) IMPLANT
GLIDESHEATH SLEND SS 6F .021 (SHEATH) IMPLANT
GLIDESHEATH SLENDER 7FR .021G (SHEATH) IMPLANT
GUIDEWIRE .025 260CM (WIRE) IMPLANT
GUIDEWIRE INQWIRE 1.5J.035X260 (WIRE) IMPLANT
INQWIRE 1.5J .035X260CM (WIRE) ×1
KIT HEART LEFT (KITS) ×1 IMPLANT
MAT PREVALON FULL STRYKER (MISCELLANEOUS) IMPLANT
PACK CARDIAC CATHETERIZATION (CUSTOM PROCEDURE TRAY) ×1 IMPLANT
TRANSDUCER W/STOPCOCK (MISCELLANEOUS) ×1 IMPLANT
TUBING CIL FLEX 10 FLL-RA (TUBING) ×1 IMPLANT

## 2022-04-25 NOTE — Progress Notes (Signed)
TR band remove, arm brace in place w/ new dressing. No hematoma or dressing noted

## 2022-04-25 NOTE — Progress Notes (Signed)
Pt and wife received d/c instructions written and verbal. All questions and concerns addressed. Dressing and arm brace remains intact no s/s of bleeding or hematoma. PIV removed and intact. Pt denies any acute pain or discomfort. Remains in stable condition.

## 2022-04-25 NOTE — Interval H&P Note (Signed)
Cath Lab Visit (complete for each Cath Lab visit)  Clinical Evaluation Leading to the Procedure:   ACS: No.  Non-ACS:    Anginal Classification: CCS III  Anti-ischemic medical therapy: Minimal Therapy (1 class of medications)  Non-Invasive Test Results: No non-invasive testing performed  Prior CABG: No previous CABG      History and Physical Interval Note:  04/25/2022 7:41 AM  Luis [name]  has presented today for surgery, with the diagnosis of cad - doe.  The various methods of treatment have been discussed with the patient and family. After consideration of risks, benefits and other options for treatment, the patient has consented to  Procedure(s): RIGHT/LEFT HEART CATH AND CORONARY ANGIOGRAPHY (N/A) as a surgical intervention.  The patient's history has been reviewed, patient examined, no change in status, stable for surgery.  I have reviewed the patient's chart and labs.  Questions were answered to the patient's satisfaction.     Larae Grooms

## 2022-04-26 ENCOUNTER — Telehealth: Payer: Self-pay | Admitting: Interventional Cardiology

## 2022-04-26 ENCOUNTER — Other Ambulatory Visit: Payer: Self-pay | Admitting: *Deleted

## 2022-04-26 DIAGNOSIS — C787 Secondary malignant neoplasm of liver and intrahepatic bile duct: Secondary | ICD-10-CM | POA: Diagnosis not present

## 2022-04-26 DIAGNOSIS — C189 Malignant neoplasm of colon, unspecified: Secondary | ICD-10-CM | POA: Diagnosis not present

## 2022-04-26 MED ORDER — EZETIMIBE 10 MG PO TABS: 10.0000 mg | ORAL_TABLET | Freq: Every day | ORAL | 3 refills | Status: DC

## 2022-04-26 NOTE — Telephone Encounter (Signed)
Patient stated he is returning a call.

## 2022-05-01 ENCOUNTER — Other Ambulatory Visit: Payer: Self-pay | Admitting: Interventional Cardiology

## 2022-05-01 DIAGNOSIS — C180 Malignant neoplasm of cecum: Secondary | ICD-10-CM | POA: Diagnosis not present

## 2022-05-01 DIAGNOSIS — Z5112 Encounter for antineoplastic immunotherapy: Secondary | ICD-10-CM | POA: Diagnosis not present

## 2022-05-01 DIAGNOSIS — Z5111 Encounter for antineoplastic chemotherapy: Secondary | ICD-10-CM | POA: Diagnosis not present

## 2022-05-01 DIAGNOSIS — C189 Malignant neoplasm of colon, unspecified: Secondary | ICD-10-CM | POA: Diagnosis not present

## 2022-05-01 DIAGNOSIS — R0600 Dyspnea, unspecified: Secondary | ICD-10-CM | POA: Diagnosis not present

## 2022-05-01 DIAGNOSIS — C787 Secondary malignant neoplasm of liver and intrahepatic bile duct: Secondary | ICD-10-CM | POA: Diagnosis not present

## 2022-05-06 ENCOUNTER — Other Ambulatory Visit: Payer: Self-pay | Admitting: Pulmonary Disease

## 2022-05-12 ENCOUNTER — Telehealth: Payer: Self-pay | Admitting: Pulmonary Disease

## 2022-05-12 MED ORDER — AZITHROMYCIN 250 MG PO TABS: ORAL_TABLET | ORAL | 0 refills | Status: DC

## 2022-05-12 MED ORDER — PREDNISONE 10 MG PO TABS: 20.0000 mg | ORAL_TABLET | Freq: Every day | ORAL | 0 refills | Status: AC

## 2022-05-12 NOTE — Telephone Encounter (Signed)
Spoke with pt who states he has had an increase in cough over last 3 days. Pt denies fever/ chills/ GI upset. Pt states this feels like a URI which he gets a couple times a year. Tammy please advise as Dr. Silas Flood is unavailable.

## 2022-05-12 NOTE — Telephone Encounter (Signed)
Well... Can send Prednisone '20mg'$  daily for 5 days. No refills  It is difficult to evaluate without being able to see the patient or have a visit.  Please let him know if he would like anything additional we can set up an office visit for further evaluation. Or he can be seen in the urgent care or emergency room  Please contact office for sooner follow up if symptoms do not improve or worsen or seek emergency care '

## 2022-05-12 NOTE — Telephone Encounter (Signed)
Called patient. He verbalized understanding. Meds sent in. Nothing further needed.

## 2022-05-12 NOTE — Telephone Encounter (Signed)
Called patient but he did not answer. Left message for him to call us back.  

## 2022-05-12 NOTE — Telephone Encounter (Signed)
Medical history significant for sarcoid, asthma and chronic right pleural effusion  Complains of 3 days of cough and congestion.  May be developing an early bronchitis.  May have a Z-Pak No. 1 take as directed.  If symptoms or not improving will need office visit for further evaluation and treatment options. Mucinex DM Twice daily  As needed  cough/congestion   Please contact office for sooner follow up if symptoms do not improve or worsen or seek emergency care

## 2022-05-12 NOTE — Telephone Encounter (Signed)
Called and spoke with patient. He verbalized understanding about the zpak. He stated that the zpak alone will not help. He usually takes it with prednisone. He wants to know if TP would be willing to send in prednisone.   TP, can you please advise?

## 2022-05-15 DIAGNOSIS — J90 Pleural effusion, not elsewhere classified: Secondary | ICD-10-CM | POA: Diagnosis not present

## 2022-05-15 DIAGNOSIS — R21 Rash and other nonspecific skin eruption: Secondary | ICD-10-CM | POA: Diagnosis not present

## 2022-05-15 DIAGNOSIS — R0600 Dyspnea, unspecified: Secondary | ICD-10-CM | POA: Diagnosis not present

## 2022-05-15 DIAGNOSIS — T451X5A Adverse effect of antineoplastic and immunosuppressive drugs, initial encounter: Secondary | ICD-10-CM | POA: Diagnosis not present

## 2022-05-15 DIAGNOSIS — C189 Malignant neoplasm of colon, unspecified: Secondary | ICD-10-CM | POA: Diagnosis not present

## 2022-05-15 DIAGNOSIS — L270 Generalized skin eruption due to drugs and medicaments taken internally: Secondary | ICD-10-CM | POA: Diagnosis not present

## 2022-05-15 DIAGNOSIS — C180 Malignant neoplasm of cecum: Secondary | ICD-10-CM | POA: Diagnosis not present

## 2022-05-15 DIAGNOSIS — Z5111 Encounter for antineoplastic chemotherapy: Secondary | ICD-10-CM | POA: Diagnosis not present

## 2022-05-15 DIAGNOSIS — Z9049 Acquired absence of other specified parts of digestive tract: Secondary | ICD-10-CM | POA: Diagnosis not present

## 2022-05-15 DIAGNOSIS — C787 Secondary malignant neoplasm of liver and intrahepatic bile duct: Secondary | ICD-10-CM | POA: Diagnosis not present

## 2022-05-22 ENCOUNTER — Ambulatory Visit (HOSPITAL_COMMUNITY)
Admission: RE | Admit: 2022-05-22 | Discharge: 2022-05-22 | Disposition: A | Discharge status: Home / Self Care | Payer: Medicare PPO | Source: Ambulatory Visit | Attending: Cardiology | Admitting: Cardiology

## 2022-05-22 DIAGNOSIS — I6523 Occlusion and stenosis of bilateral carotid arteries: Secondary | ICD-10-CM | POA: Diagnosis not present

## 2022-05-23 ENCOUNTER — Other Ambulatory Visit: Payer: Self-pay | Admitting: Interventional Cardiology

## 2022-05-23 DIAGNOSIS — M48061 Spinal stenosis, lumbar region without neurogenic claudication: Secondary | ICD-10-CM | POA: Diagnosis not present

## 2022-05-23 DIAGNOSIS — R7303 Prediabetes: Secondary | ICD-10-CM | POA: Diagnosis not present

## 2022-05-23 DIAGNOSIS — M5416 Radiculopathy, lumbar region: Secondary | ICD-10-CM | POA: Diagnosis not present

## 2022-05-23 DIAGNOSIS — Z139 Encounter for screening, unspecified: Secondary | ICD-10-CM | POA: Diagnosis not present

## 2022-05-23 DIAGNOSIS — I771 Stricture of artery: Secondary | ICD-10-CM | POA: Diagnosis not present

## 2022-05-23 DIAGNOSIS — Z6835 Body mass index (BMI) 35.0-35.9, adult: Secondary | ICD-10-CM | POA: Diagnosis not present

## 2022-05-23 DIAGNOSIS — D696 Thrombocytopenia, unspecified: Secondary | ICD-10-CM | POA: Diagnosis not present

## 2022-05-23 DIAGNOSIS — Z0000 Encounter for general adult medical examination without abnormal findings: Secondary | ICD-10-CM | POA: Diagnosis not present

## 2022-05-23 DIAGNOSIS — I700 Atherosclerosis of aorta: Secondary | ICD-10-CM | POA: Diagnosis not present

## 2022-05-26 ENCOUNTER — Telehealth: Payer: Medicare PPO | Admitting: Primary Care

## 2022-05-26 ENCOUNTER — Telehealth: Payer: Self-pay | Admitting: Pulmonary Disease

## 2022-05-26 DIAGNOSIS — J4521 Mild intermittent asthma with (acute) exacerbation: Secondary | ICD-10-CM

## 2022-05-26 DIAGNOSIS — U071 COVID-19: Secondary | ICD-10-CM | POA: Diagnosis not present

## 2022-05-26 MED ORDER — PREDNISONE 10 MG PO TABS: ORAL_TABLET | ORAL | 0 refills | Status: DC

## 2022-05-26 NOTE — Telephone Encounter (Signed)
PT called to speak w/Dr. Lemmie Evens. States his cond is worsening. Resp infection he feels has moved up into his lungs. Has been on a round of pred. Please call to advise. TY

## 2022-05-26 NOTE — Telephone Encounter (Signed)
Received the following message from patient:   Luis [name],   I did a follow-up Covid test and the results were negative.  Please let me know when you send my prescription to the [facility].   Thanks for taking the time to see me today.  I did advise him that it appears the prednisone was already sent in for him.

## 2022-05-26 NOTE — Progress Notes (Signed)
Virtual Visit via Video Note  I connected with Luis [name] on 05/26/22 at 11:30 AM EST by a video enabled telemedicine application and verified that I am speaking with the correct person using two identifiers.  Location: Patient: Home Provider: Office    I discussed the limitations of evaluation and management by telemedicine and the availability of in person appointments. The patient expressed understanding and agreed to proceed.  History of Present Illness: 81 year old male.  Past medical history significant for hypertension, coronary artery disease, asthmatic bronchitis, pulmonary fibrosis, sarcoidosis, iron deficiency anemia, adenocarcinoma of cecum.  Patient of Dr. Silas Flood,  05/26/2022 Patient developed chest congestion and shortness of breath symptoms 2 weeks ago. He was prescribed Z-Pak and prednisone 20 mg daily x 5 days on 05/12/2021. He tested positive today for covid after an exposure. Respiratory symptoms are no worse. Breathing is baseline. No shortness of breath. He continues to have chest congestion and cough. He saw some improvement with prednisone but course was not long enough. He is getting up light green sputum. He is taking robitussin. Lasix is working well. No increased dyspnea symptoms, chest tightness or wheezing.   Observations/Objective:  Unable to connect to video portion of visit No audible shortness of breath or cough  O2 96% RA   Assessment and Plan:  Covid infection:  - Difficult to sort out when symptoms start. He has a number of pulmonary condition that cause chronic symptoms. He tested positive for covid today d.t exposure. His respiratory symptoms are no worse. He is high risk d.t sarcoid and asthma. Plan retest today, if positive will send antiviral.   Asthmatic bronchitis: - Extending prednisone course '40mg'$  x 2 days, '30mg'$  x 2 days, '20mg'$  x 2 days; '10mg'$  x 2 days  - Continue Albuterol HFA 2 puffs every 4-6 hours as needed for sob/wheezing and  Robitussin 73m every 4-6 hours for cough    Follow Up Instructions:  As needed if symptoms do not improve or worsen     I discussed the assessment and treatment plan with the patient. The patient was provided an opportunity to ask questions and all were answered. The patient agreed with the plan and demonstrated an understanding of the instructions.   The patient was advised to call back or seek an in-person evaluation if the symptoms worsen or if the condition fails to improve as anticipated.  I provided 22 minutes of non-face-to-face time during this encounter.   EMartyn Ehrich NP

## 2022-05-26 NOTE — Telephone Encounter (Signed)
Video visit for eval of pos covid test this morning- sore throat, cough

## 2022-05-26 NOTE — Telephone Encounter (Signed)
I sent prednisone in. No need for another RX. If covid was positive we had discuss antiviral but not needed. If he develop new respiratory symptoms or fevers recheck covid.

## 2022-05-26 NOTE — Telephone Encounter (Signed)
Added at calls end he took a Covid test and it was pos.

## 2022-05-29 DIAGNOSIS — R0600 Dyspnea, unspecified: Secondary | ICD-10-CM | POA: Diagnosis not present

## 2022-05-29 DIAGNOSIS — C189 Malignant neoplasm of colon, unspecified: Secondary | ICD-10-CM | POA: Diagnosis not present

## 2022-05-29 DIAGNOSIS — R21 Rash and other nonspecific skin eruption: Secondary | ICD-10-CM | POA: Diagnosis not present

## 2022-05-29 DIAGNOSIS — J90 Pleural effusion, not elsewhere classified: Secondary | ICD-10-CM | POA: Diagnosis not present

## 2022-05-29 DIAGNOSIS — Z9049 Acquired absence of other specified parts of digestive tract: Secondary | ICD-10-CM | POA: Diagnosis not present

## 2022-05-29 DIAGNOSIS — Z5111 Encounter for antineoplastic chemotherapy: Secondary | ICD-10-CM | POA: Diagnosis not present

## 2022-05-29 DIAGNOSIS — C180 Malignant neoplasm of cecum: Secondary | ICD-10-CM | POA: Diagnosis not present

## 2022-05-29 DIAGNOSIS — C787 Secondary malignant neoplasm of liver and intrahepatic bile duct: Secondary | ICD-10-CM | POA: Diagnosis not present

## 2022-05-29 DIAGNOSIS — Z5112 Encounter for antineoplastic immunotherapy: Secondary | ICD-10-CM | POA: Diagnosis not present

## 2022-06-07 ENCOUNTER — Other Ambulatory Visit: Payer: Self-pay | Admitting: *Deleted

## 2022-06-07 DIAGNOSIS — I6523 Occlusion and stenosis of bilateral carotid arteries: Secondary | ICD-10-CM

## 2022-06-10 ENCOUNTER — Other Ambulatory Visit: Payer: Self-pay | Admitting: Pulmonary Disease

## 2022-06-13 DIAGNOSIS — Z5111 Encounter for antineoplastic chemotherapy: Secondary | ICD-10-CM | POA: Diagnosis not present

## 2022-06-13 DIAGNOSIS — C787 Secondary malignant neoplasm of liver and intrahepatic bile duct: Secondary | ICD-10-CM | POA: Diagnosis not present

## 2022-06-13 DIAGNOSIS — C189 Malignant neoplasm of colon, unspecified: Secondary | ICD-10-CM | POA: Diagnosis not present

## 2022-06-13 DIAGNOSIS — C180 Malignant neoplasm of cecum: Secondary | ICD-10-CM | POA: Diagnosis not present

## 2022-06-14 DIAGNOSIS — Z947 Corneal transplant status: Secondary | ICD-10-CM | POA: Diagnosis not present

## 2022-06-14 DIAGNOSIS — Z961 Presence of intraocular lens: Secondary | ICD-10-CM | POA: Diagnosis not present

## 2022-06-19 ENCOUNTER — Other Ambulatory Visit: Payer: Self-pay | Admitting: Pulmonary Disease

## 2022-06-27 DIAGNOSIS — Z5112 Encounter for antineoplastic immunotherapy: Secondary | ICD-10-CM | POA: Diagnosis not present

## 2022-06-27 DIAGNOSIS — J90 Pleural effusion, not elsewhere classified: Secondary | ICD-10-CM | POA: Diagnosis not present

## 2022-06-27 DIAGNOSIS — C189 Malignant neoplasm of colon, unspecified: Secondary | ICD-10-CM | POA: Diagnosis not present

## 2022-06-27 DIAGNOSIS — C787 Secondary malignant neoplasm of liver and intrahepatic bile duct: Secondary | ICD-10-CM | POA: Diagnosis not present

## 2022-06-27 DIAGNOSIS — Z79899 Other long term (current) drug therapy: Secondary | ICD-10-CM | POA: Diagnosis not present

## 2022-06-27 DIAGNOSIS — J984 Other disorders of lung: Secondary | ICD-10-CM | POA: Diagnosis not present

## 2022-06-27 DIAGNOSIS — K7689 Other specified diseases of liver: Secondary | ICD-10-CM | POA: Diagnosis not present

## 2022-06-27 DIAGNOSIS — Z5111 Encounter for antineoplastic chemotherapy: Secondary | ICD-10-CM | POA: Diagnosis not present

## 2022-06-27 DIAGNOSIS — C180 Malignant neoplasm of cecum: Secondary | ICD-10-CM | POA: Diagnosis not present

## 2022-06-27 DIAGNOSIS — C182 Malignant neoplasm of ascending colon: Secondary | ICD-10-CM | POA: Diagnosis not present

## 2022-07-01 ENCOUNTER — Other Ambulatory Visit: Payer: Self-pay | Admitting: Pulmonary Disease

## 2022-07-01 NOTE — Progress Notes (Deleted)
Cardiology Office Note   Date:  07/01/2022   ID:  Luis [name], DOB June 27, 1940, MRN [id number]  PCP:  Luis Sake, MD    No chief complaint on file.  CAD  Wt Readings from Last 3 Encounters:  04/25/22 234 lb (106.1 kg)  04/21/22 235 lb 12.8 oz (107 kg)  04/18/22 236 lb 9.6 oz (107.3 kg)       History of Present Illness: Luis [name] is a 82 y.o. male  with history of CAD status post DES to the diagonal 08/2014 ramus intermedius was totally occluded at the time, eventually underwent CTO PCI with overlapping DES x2 to the ramus 02/2015.  Chest pain 05/2016 relieved with nitroglycerin and treated with Ranexa but has been off.  NST 12/2017 normal LVEF normal study.  Found to have a totally occluded right carotid on Dopplers 04/2018 but other vessels open. HTN, HLD, Also has adenocarcinoma of the colon status post colectomy.   Patient underwent cardiac cath 01/02/2019 with DES to the LAD and balloon angioplasty to the diagonal bifurcation.  Plan for aspirin and Plavix for at least 6 months.   He had liver biopsy.He has had some CP with eating certin foods and with exertion but improved with NTG and amlodipine.  Sx resolved.   He got chemotherapy for adenoCA of colon with liver Mets, then Surgery planned once tumor had shrunk.  He decided not to have surgery, so he was treated with chemotherapy and will have an ablation of the tumor on the liver.   Walking was limited by spinal stenosis.  Breathing was better with Breo.    Hsa been treated for colon cancer with liver mets: "Procedure: 07/23/2020 Diagnostic laparoscopy, robot-assisted insertion of hepatic artery infusion pump (Intera 3000, 30 mL reservoir, flow rate 1.3 ml/day), portal lymphadenectomy, core tumor biopsy OPERATIVE FINDINGS: No extrahepatic disease. Bilobar liver metastases. Steatotic liver. Completely replaced common hepatic artery off SMA. Bilobar hepatic perfusion without extrahepatic perfusion when pump bolused  with blue dye.  Procedure: 01/06/2021 Diagnostic laparoscopy, robot-assisted partial hepatectomy (segment 2, segment 6), microwave ablation (segment 7), intraoperative ultrasound OPERATIVE FINDINGS: No extrahepatic disease. Three tumors identified in the liver in segments 2, 6 and 7, consistent with preoperative imaging. Segment 2 and 6 tumors resected with grossly negative margins. Segment 6 resection bed with small bile leak, oversewn and clipped (drain left in place). Segment 7 tumor ablated under ultrasound and navigation guidance. Liver steatotic. Pathology: segment 2 and segment 6 resections c/w adenocarcinoma of colorectal primary, negative margins"   He had some more chest pain and went to ER with negative w/u in 01/2021.  Hbg was 9.6.  Noted at [facility] that he had to use additional diuretics for pleural effusions and shortness of breath.  Chest x-ray on January 22, 2021 showed significant improvement in the right-sided pleural effusion.  Past Medical History:  Diagnosis Date   Adenocarcinoma of cecum ([place]) 06/21/2017   Arthritis    "mid back; hands; knees" (02/24/2015)   Basal cell carcinoma    left shoulder; mid chest; right eyelid (02/24/2015)   CAD (coronary artery disease)    a. 08/2014 Cath/PCI: LM nl, LAD 30p, D1 95 (2.25x12 Resolute Integrity DES), LCX small, RI 100 (attempted PCI) - branches fill via L->L collats, RCA dominant, nl, RPDA/PLA nl, EF 60^. b. 02/24/2015 PCI CTO of Ramus DES x2.   Chronic bronchitis ([place])    hx   Diverticulosis 05/2005   Elevated lipase    Fuchs' corneal  dystrophy    History of adenomatous polyp of colon 05/2005   8 mm adenoma   History of blood transfusion    "related to some of my surgeries"   Hyperlipidemia    Hypertension    Iron deficiency anemia due to chronic blood loss 06/21/2017   Pneumonia    Prostate cancer ([place]) 2011   S/P seed implant   Sarcoidosis    Thrombocytopenia ([place])    a. Noted on prior labs, unclear of what w/u done.    Trifascicular block     Past Surgical History:  Procedure Laterality Date   BASAL CELL CARCINOMA EXCISION Left    shoulder   CARDIAC CATHETERIZATION N/A 02/24/2015   Procedure: Coronary/Bypass Graft CTO Intervention;  Surgeon: Luis Booze, MD;  Location: [place] [facility];  Service: Cardiovascular;  Laterality: N/A;   CARDIAC CATHETERIZATION  02/24/2015   Procedure: Coronary/Graft Atherectomy;  Surgeon: Luis Booze, MD;  Location: [place] [facility];  Service: Cardiovascular;;   CATARACT EXTRACTION W/ INTRAOCULAR LENS  IMPLANT, BILATERAL Bilateral ~ 2011-2012   COLONOSCOPY W/ POLYPECTOMY  06/08/2005 and 10/18/2010   8 mm adenoma 2006, none 2012. Diverticulosis and internal hemorrhoids.   CORNEAL TRANSPLANT Bilateral ~ 2011-2012   "@ same time as cataract OR"   CORONARY ANGIOPLASTY WITH STENT PLACEMENT  08/2014; 02/24/2015   "1 stent + 1 stent"   CORONARY STENT INTERVENTION N/A 01/02/2019   Procedure: CORONARY STENT INTERVENTION;  Surgeon: Luis Booze, MD;  Location: [place] [facility];  Service: Cardiovascular;  Laterality: N/A;   EYE SURGERY     FINGER SURGERY Right 2014   "reattached middle finger"   INSERTION PROSTATE RADIATION SEED  ~ 2011   IR IMAGING GUIDED PORT INSERTION  03/04/2019   IR RADIOLOGIST EVAL & MGMT  05/26/2020   IR US GUIDE BX ASP/DRAIN  03/04/2019   LAPAROSCOPIC CHOLECYSTECTOMY  2015   LAPAROSCOPIC PARTIAL COLECTOMY N/A 07/26/2017   Procedure: LAPAROSCOPIC PARTIAL COLECTOMY, EXCISION SCROTAL CYST;  Surgeon: Luis Pickerel, MD;  Location: [facility];  Service: General;  Laterality: N/A;   LEFT HEART CATH AND CORONARY ANGIOGRAPHY N/A 01/02/2019   Procedure: LEFT HEART CATH AND CORONARY ANGIOGRAPHY;  Surgeon: Luis Booze, MD;  Location: [place] [facility];  Service: Cardiovascular;  Laterality: N/A;   LEFT HEART CATHETERIZATION WITH CORONARY ANGIOGRAM N/A 08/12/2014   Procedure: LEFT HEART CATHETERIZATION WITH CORONARY ANGIOGRAM;  Surgeon: Luis Ohara, MD;  Location: [facility] CATH LAB;  Service: Cardiovascular;  Laterality: N/A;   LYMPH NODE BIOPSY     mid chest   MOHS SURGERY Right ~ 2011   "eyelid; for basal cell"   RADIOLOGY WITH ANESTHESIA N/A 06/30/2020   Procedure: IR WITH ANESTHESIA MICROWAVE ABLATION;  Surgeon: Corrie Mckusick, DO;  Location: [facility];  Service: Anesthesiology;  Laterality: N/A;   RIGHT/LEFT HEART CATH AND CORONARY ANGIOGRAPHY N/A 04/25/2022   Procedure: RIGHT/LEFT HEART CATH AND CORONARY ANGIOGRAPHY;  Surgeon: Luis Booze, MD;  Location: [place] [facility];  Service: Cardiovascular;  Laterality: N/A;   THORACENTESIS N/A 02/11/2021   Procedure: Mathews Robinsons;  Surgeon: Lanier Clam, MD;  Location: [facility] ENDOSCOPY;  Service: Pulmonary;  Laterality: N/A;   THORACENTESIS Right 04/04/2021   Procedure: THORACENTESIS;  Surgeon: Candee Furbish, MD;  Location: [facility] ENDOSCOPY;  Service: Pulmonary;  Laterality: Right;   THORACENTESIS Right 08/16/2021   Procedure: THORACENTESIS;  Surgeon: Jacky Kindle, MD;  Location: [facility] ENDOSCOPY;  Service: Pulmonary;  Laterality: Right;   THORACENTESIS Right  11/29/2021   Procedure: THORACENTESIS;  Surgeon: [name], MD;  Location: [facility] ENDOSCOPY;  Service: Pulmonary;  Laterality: Right;   THORACENTESIS N/A 03/17/2022   Procedure: Mathews Robinsons;  Surgeon: Lanier Clam, MD;  Location: [facility] ENDOSCOPY;  Service: Pulmonary;  Laterality: N/A;   THOROCOTOMY WITH LOBECTOMY Right ~ 1991   partial removal right lung   TONSILLECTOMY  ~ 1950     Current Outpatient Medications  Medication Sig Dispense Refill   acetaminophen (TYLENOL) 500 MG tablet Take 1,000 mg by mouth every 6 (six) hours as needed for moderate pain.     albuterol (VENTOLIN HFA) 108 (90 Base) MCG/ACT inhaler INHALE 1-2 PUFFS BY MOUTH EVERY 6 HOURS AS NEEDED FOR WHEEZE OR SHORTNESS OF BREATH 18 each 1   amLODipine (NORVASC) 5 MG tablet TAKE 1 TABLET (5 MG TOTAL) BY MOUTH DAILY. 90 tablet 3   aspirin EC 81 MG  tablet Take 81 mg by mouth daily. Swallow whole.     cetirizine (ZYRTEC) 10 MG tablet TAKE 1 TABLET BY MOUTH EVERY DAY 90 tablet 1   Cholecalciferol (VITAMIN D3) 2000 units capsule Take 2,000 Units by mouth daily.     clobetasol cream (TEMOVATE) [id number] % Apply 1 Application topically daily as needed (Rash).     clopidogrel (PLAVIX) 75 MG tablet TAKE 1 TABLET BY MOUTH EVERY DAY 90 tablet 3   ezetimibe (ZETIA) 10 MG tablet Take 1 tablet (10 mg total) by mouth daily. 90 tablet 3   fluticasone (FLONASE) 50 MCG/ACT nasal spray SPRAY 2 SPRAYS INTO EACH NOSTRIL EVERY DAY 48 mL 3   furosemide (LASIX) 40 MG tablet Take 40 mg by mouth daily.     gabapentin (NEURONTIN) 100 MG capsule Take 100 mg by mouth at bedtime as needed (neuropathy).     metoprolol succinate (TOPROL XL) 50 MG 24 hr tablet Take 1 tablet (50 mg total) by mouth every evening. Take with or immediately following a meal. 90 tablet 2   metroNIDAZOLE (METROCREAM) 0.75 % cream Apply topically.     minocycline (MINOCIN) 100 MG capsule Take by mouth.     Multiple Vitamins-Minerals (VITRUM SENIOR) TABS Take 1 tablet by mouth daily.     nitroGLYCERIN (NITROSTAT) 0.4 MG SL tablet PLACE 1 TABLET UNDER THE TONGUE EVERY 5 MINUTES AS NEEDED FOR CHEST PAIN FOR 3 DOSES (Patient taking differently: Place 0.4 mg under the tongue every 5 (five) minutes as needed.) 25 tablet 3   predniSONE (DELTASONE) 10 MG tablet 4 tabs for 2 days, then 3 tabs for 2 days, 2 tabs for 2 days, then 1 tab for 2 days, then stop 20 tablet 0   ranolazine (RANEXA) 500 MG 12 hr tablet TAKE 1 TABLET BY MOUTH TWICE A DAY 180 tablet 3   rosuvastatin (CRESTOR) 20 MG tablet Take 1 tablet (20 mg total) by mouth daily. 90 tablet 3   traMADol (ULTRAM) 50 MG tablet Take 2 tablets (100 mg total) by mouth every 6 (six) hours as needed. (Patient taking differently: Take 100 mg by mouth every 6 (six) hours as needed for moderate pain or severe pain.) 10 tablet 0   No current facility-administered  medications for this visit.    Allergies:   Patient has no known allergies.    Social History:  The patient  reports that he has been smoking cigars and cigarettes. He has never used smokeless tobacco. He reports current alcohol use of about 2.0 standard drinks of alcohol per week. He reports that he does not  use drugs.   Family History:  The patient's ***family history includes Coronary artery disease (age of onset: 33) in his sister; Heart attack in his sister; Heart disease (age of onset: 76) in his father.    ROS:  Please see the history of present illness.   Otherwise, review of systems are positive for ***.   All other systems are reviewed and negative.    PHYSICAL EXAM: VS:  There were no vitals taken for this visit. , BMI There is no height or weight on file to calculate BMI. GEN: Well nourished, well developed, in no acute distress HEENT: normal Neck: no JVD, carotid bruits, or masses Cardiac: ***RRR; no murmurs, rubs, or gallops,no edema  Respiratory:  clear to auscultation bilaterally, normal work of breathing GI: soft, nontender, nondistended, + BS MS: no deformity or atrophy Skin: warm and dry, no rash Neuro:  Strength and sensation are intact Psych: euthymic mood, full affect   EKG:   The ekg ordered today demonstrates ***   Recent Labs: 08/01/2021: NT-Pro BNP 69; Platelets 171 09/09/2021: BUN 15; Creatinine, Ser 0.80 04/25/2022: Hemoglobin 11.6; Hemoglobin 11.9; Potassium 3.8; Potassium 3.8; Sodium 137; Sodium 137   Lipid Panel    Component Value Date/Time   CHOL 176 04/24/2022 0758   TRIG 75 04/24/2022 0758   HDL 72 04/24/2022 0758   CHOLHDL 2.4 04/24/2022 0758   CHOLHDL 3.2 10/13/2015 1005   VLDL 37 (H) 10/13/2015 1005   LDLCALC 90 04/24/2022 0758     Other studies Reviewed: Additional studies/ records that were reviewed today with results demonstrating: ***.   ASSESSMENT AND PLAN:  CAD:  Hyperlipidemia: PAD: HTN: LE edema: Prior  anemia:   Current medicines are reviewed at length with the patient today.  The patient concerns regarding his medicines were addressed.  The following changes have been made:  No change***  Labs/ tests ordered today include: *** No orders of the defined types were placed in this encounter.   Recommend 150 minutes/week of aerobic exercise Low fat, low carb, high fiber diet recommended  Disposition:   FU in ***   Signed, Larae Grooms, MD  07/01/2022 10:38 PM    [facility] [address], [place], [place]  [address] Phone: [phone]; Fax: [phone]

## 2022-07-03 ENCOUNTER — Ambulatory Visit: Payer: Medicare PPO | Attending: Interventional Cardiology | Admitting: Interventional Cardiology

## 2022-07-03 DIAGNOSIS — R0609 Other forms of dyspnea: Secondary | ICD-10-CM

## 2022-07-03 DIAGNOSIS — E782 Mixed hyperlipidemia: Secondary | ICD-10-CM

## 2022-07-03 DIAGNOSIS — I25110 Atherosclerotic heart disease of native coronary artery with unstable angina pectoris: Secondary | ICD-10-CM

## 2022-07-03 DIAGNOSIS — I6523 Occlusion and stenosis of bilateral carotid arteries: Secondary | ICD-10-CM

## 2022-07-03 DIAGNOSIS — I10 Essential (primary) hypertension: Secondary | ICD-10-CM

## 2022-07-03 DIAGNOSIS — E785 Hyperlipidemia, unspecified: Secondary | ICD-10-CM

## 2022-07-06 DIAGNOSIS — M48061 Spinal stenosis, lumbar region without neurogenic claudication: Secondary | ICD-10-CM | POA: Diagnosis not present

## 2022-07-06 DIAGNOSIS — M25552 Pain in left hip: Secondary | ICD-10-CM | POA: Diagnosis not present

## 2022-07-10 DIAGNOSIS — Z9049 Acquired absence of other specified parts of digestive tract: Secondary | ICD-10-CM | POA: Diagnosis not present

## 2022-07-10 DIAGNOSIS — C787 Secondary malignant neoplasm of liver and intrahepatic bile duct: Secondary | ICD-10-CM | POA: Diagnosis not present

## 2022-07-10 DIAGNOSIS — J90 Pleural effusion, not elsewhere classified: Secondary | ICD-10-CM

## 2022-07-10 DIAGNOSIS — Z79899 Other long term (current) drug therapy: Secondary | ICD-10-CM | POA: Diagnosis not present

## 2022-07-10 DIAGNOSIS — Z5111 Encounter for antineoplastic chemotherapy: Secondary | ICD-10-CM | POA: Diagnosis not present

## 2022-07-10 DIAGNOSIS — E876 Hypokalemia: Secondary | ICD-10-CM | POA: Diagnosis not present

## 2022-07-10 DIAGNOSIS — C182 Malignant neoplasm of ascending colon: Secondary | ICD-10-CM | POA: Diagnosis not present

## 2022-07-10 DIAGNOSIS — C189 Malignant neoplasm of colon, unspecified: Secondary | ICD-10-CM | POA: Diagnosis not present

## 2022-07-10 DIAGNOSIS — Z5112 Encounter for antineoplastic immunotherapy: Secondary | ICD-10-CM | POA: Diagnosis not present

## 2022-07-10 NOTE — Telephone Encounter (Signed)
Dr. Silas Flood plese advise on the following My Chart message, pt advise to call office and schedule OV:   Luis Hay "Chuck"  P [facility] (supporting Martyn Ehrich, NP)50 minutes ago (10:08 AM)    It seems to be time to drain my lung again.  It is to the point where I get short of breath walking from my office to my vehicle.  Let me know if we need to do an office visit.  I stopped taking Plavix yesterday.   Thank you

## 2022-07-12 NOTE — Telephone Encounter (Signed)
Please schedule thoracentesis with Dr. Erin Fulling on Friday 07/14/2022 at [facility]. Case request has been placed.

## 2022-07-12 NOTE — Telephone Encounter (Signed)
Pt has been scheduled for Friday 2/2 at 2:00, arrive 1:30.  I spoke to pt & gave him appt info.  Nothing further needed.  Will route to triage so MyChart message can be closed.

## 2022-07-14 ENCOUNTER — Ambulatory Visit (HOSPITAL_COMMUNITY): Payer: Medicare PPO

## 2022-07-14 ENCOUNTER — Encounter (HOSPITAL_COMMUNITY): Payer: Self-pay | Admitting: Internal Medicine

## 2022-07-14 ENCOUNTER — Encounter (HOSPITAL_COMMUNITY)
Admission: RE | Disposition: A | Discharge status: Home / Self Care | Payer: Self-pay | Source: Home / Self Care | Attending: Pulmonary Disease

## 2022-07-14 ENCOUNTER — Ambulatory Visit (HOSPITAL_COMMUNITY)
Admission: RE | Admit: 2022-07-14 | Discharge: 2022-07-14 | Disposition: A | Discharge status: Home / Self Care | Payer: Medicare PPO | Attending: Pulmonary Disease | Admitting: Pulmonary Disease

## 2022-07-14 DIAGNOSIS — J90 Pleural effusion, not elsewhere classified: Secondary | ICD-10-CM | POA: Diagnosis not present

## 2022-07-14 DIAGNOSIS — Z9889 Other specified postprocedural states: Secondary | ICD-10-CM | POA: Insufficient documentation

## 2022-07-14 DIAGNOSIS — R846 Abnormal cytological findings in specimens from respiratory organs and thorax: Secondary | ICD-10-CM | POA: Diagnosis not present

## 2022-07-14 DIAGNOSIS — I517 Cardiomegaly: Secondary | ICD-10-CM | POA: Insufficient documentation

## 2022-07-14 HISTORY — PX: THORACENTESIS: SHX235

## 2022-07-14 SURGERY — THORACENTESIS
Anesthesia: LOCAL

## 2022-07-14 NOTE — Op Note (Signed)
Thoracentesis  Procedure Note  SULLIVAN JACUINDE  [id number]  Apr 30, 1941  Date:07/14/22  Time:1:28 PM   Provider Performing:Anahita Cua L Shawonda Kerce   Procedure: Thoracentesis with imaging guidance (23536)  Indication(s) Pleural Effusion  Consent Risks of the procedure as well as the alternatives and risks of each were explained to the patient and/or caregiver.  Consent for the procedure was obtained and is signed in the bedside chart  Anesthesia Topical only with 1% lidocaine   Time Out Verified patient identification, verified procedure, site/side was marked, verified correct patient position, special equipment/implants available, medications/allergies/relevant history reviewed, required imaging and test results available.  Sterile Technique Maximal sterile technique including full sterile barrier drape, hand hygiene, sterile gown, sterile gloves, mask, hair covering, sterile ultrasound probe cover (if used).  Procedure Description Ultrasound was used to identify appropriate pleural anatomy for placement and overlying skin marked.  Area of drainage cleaned and draped in sterile fashion. Lidocaine was used to anesthetize the skin and subcutaneous tissue.  1000 cc's of amber appearing fluid was drained from the right pleural space. Catheter then removed and bandaid applied to site.  Complications/Tolerance None; patient tolerated the procedure well. Chest X-ray is ordered to confirm no post-procedural complication.  EBL Minimal  Specimen(s) Pleural fluid   Garner Nash, DO [facility] 07/14/2022 1:28 PM

## 2022-07-14 NOTE — H&P (Signed)
Expand All Collapse All    '@Patient'$  ID: Luis [name], male    DOB: [birth year], 82 y.o.   MRN: [id number]       Chief Complaint  Patient presents with   Follow-up      Pt is here for follow up for DOE. Pt had last thoro done 6/20. Pt states he feels like he is needing another thoro done. Pt states he has been getting increased SOB even getting dressed recently.       Referring provider: [name], MD   HPI:    82 y.o. man whom we are seeing in follow-up of multiple pulmonary issues including burnt out sarcoid, asthma, ongoing chronic of right pleural effusion.  Most recent oncology note from [place] reviewed.   Doing well. Serial scans at [facility] or oncologic surveillance show accumulation of pleural effusion on my review, most recent scan 12/2021.  More symptomatic in terms of dyspnea last week or so.  Feels like fluid is returned.  Has nagging cough.  Congestion.  Feels like draining from the back of his throat accumulating in the morning.  Coughs up phlegm and better throughout the day.  Has taken Benadryl a couple nights with some improvement.  Continues Flonase once a day.     HPI at initial visit: Overall, disorders are largely unchanged.  Formally followed with [name] for sarcoidosis.  This has been quiescent for some time.  Most recent CT scan reviewed which reveals on my interpretation stable bilateral fibrosis, scattered calcified granulomas in the lung as well as significant calcified hilar and mediastinal lymphadenopathy and sequela of prior lung surgery.   He has been seen in clinic recently by [name].  Primary complaint was dyspnea on exertion as well as cough.  These persist.  Chest imaging obtained as above.  PFTs obtained last week which reveal suggestion of mild to moderate restriction on spirometry without significant bronchodilator response.  Moderate restriction confirmed with total lung capacity 69% of predicted.  DLCO corrects to within normal limits with  adjustment for hemoglobin.   Cough and shortness of breath coincide with worsening nasal congestion.  Postnasal drip.  Not really using meds to address this this.  He is tolerating chemotherapy well.  Review of labs indicate stable hemoglobin without concern for symptomatic anemia at this time.    07/14/2022: here today with planned thoracentesis   No Known Allergies       Immunization History  Administered Date(s) Administered   DTaP / IPV 09/25/2012   Fluad Quad(high Dose 65+) 02/19/2019, 03/14/2022   Influenza Split 03/27/2014   Influenza, High Dose Seasonal PF 03/27/2014, 01/27/2017, 02/22/2018, 03/12/2021   Influenza-Unspecified 02/24/2017, 03/10/2020   PFIZER Comirnaty(Gray Top)Covid-19 Tri-Sucrose Vaccine 06/27/2019, 07/21/2019   PFIZER(Purple Top)SARS-COV-2 Vaccination 07/11/2019, 07/21/2019, 01/26/2020, 10/26/2020   Pneumococcal Conjugate-13 03/27/2014   Pneumococcal Polysaccharide-23 11/10/2009   Pneumococcal-Unspecified 03/12/2014   Tdap 03/24/2011, 09/25/2012, 05/28/2021   Zoster Recombinat (Shingrix) 09/01/2018, 02/19/2019   Zoster, Live 03/21/2010          Past Medical History:  Diagnosis Date   Adenocarcinoma of cecum ([place]) 06/21/2017   Arthritis      "mid back; hands; knees" (02/24/2015)   Basal cell carcinoma      left shoulder; mid chest; right eyelid (02/24/2015)   CAD (coronary artery disease)      a. 08/2014 Cath/PCI: LM nl, LAD 30p, D1 95 (2.25x12 Resolute Integrity DES), LCX small, RI 100 (attempted PCI) - branches fill via L->L collats, RCA dominant,  nl, RPDA/PLA nl, EF 60^. b. 02/24/2015 PCI CTO of Ramus DES x2.   Chronic bronchitis ([place])      hx   Diverticulosis 05/2005   Elevated lipase     Fuchs' corneal dystrophy     History of adenomatous polyp of colon 05/2005    8 mm adenoma   History of blood transfusion      "related to some of my surgeries"   Hyperlipidemia     Hypertension     Iron deficiency anemia due to chronic blood loss 06/21/2017    Pneumonia     Prostate cancer ([place]) 2011    S/P seed implant   Sarcoidosis     Thrombocytopenia ([place])      a. Noted on prior labs, unclear of what w/u done.   Trifascicular block        Tobacco History: Social History        Tobacco Use  Smoking Status Light Smoker   Packs/day: 0.00   Years: 59.00   Total pack years: 0.00   Types: Cigars, Cigarettes   Last attempt to quit: 02/24/2015   Years since quitting: 7.0  Smokeless Tobacco Never  Tobacco Comments    02/24/2015 "quit cigarettes ~ 2000 ago but smokes cigars about once every two months. ALS 03/14/2022    Ready to quit: Not Answered Counseling given: Not Answered Tobacco comments: 02/24/2015 "quit cigarettes ~ 2000 ago but smokes cigars about once every two months. ALS 03/14/2022     Continue to not smoke       Outpatient Encounter Medications as of 03/14/2022  Medication Sig   acetaminophen (TYLENOL) 325 MG tablet Take 650 mg by mouth every 6 (six) hours as needed for moderate pain.   albuterol (VENTOLIN HFA) 108 (90 Base) MCG/ACT inhaler Inhale 1-2 puffs into the lungs every 6 (six) hours as needed for wheezing or shortness of breath.   amLODipine (NORVASC) 5 MG tablet Take 1 tablet (5 mg total) by mouth daily.   cetirizine (ZYRTEC ALLERGY) 10 MG tablet Take 1 tablet (10 mg total) by mouth daily.   Cholecalciferol (VITAMIN D3) 2000 units capsule Take 2,000 Units by mouth daily.   clobetasol (TEMOVATE) 0.05 % external solution Apply 1 Application topically 2 (two) times daily.   clopidogrel (PLAVIX) 75 MG tablet Take 75 mg by mouth daily.   fluticasone (FLONASE) 50 MCG/ACT nasal spray Place 2 sprays into both nostrils daily.   furosemide (LASIX) 20 MG tablet Take 1 tablet (20 mg total) by mouth daily.   gabapentin (NEURONTIN) 100 MG capsule Take 200 mg by mouth at bedtime as needed (neuropathy).   ibuprofen (ADVIL) 200 MG tablet Take 400 mg by mouth every 6 (six) hours as needed for pain.   metoprolol succinate (TOPROL XL)  50 MG 24 hr tablet Take 1 tablet (50 mg total) by mouth every evening. Take with or immediately following a meal.   Multiple Vitamins-Minerals (VITRUM SENIOR) TABS Take 1 tablet by mouth daily.   naproxen sodium (ALEVE) 220 MG tablet Take 440 mg by mouth daily as needed (pain).   nitroGLYCERIN (NITROSTAT) 0.4 MG SL tablet PLACE 1 TABLET UNDER THE TONGUE EVERY 5 MINUTES AS NEEDED FOR CHEST PAIN FOR 3 DOSES (Patient taking differently: Place 0.4 mg under the tongue every 5 (five) minutes as needed.)   ranolazine (RANEXA) 500 MG 12 hr tablet TAKE 1 TABLET BY MOUTH TWICE A DAY   rosuvastatin (CRESTOR) 20 MG tablet Take 1 tablet (20 mg total) by mouth  daily.   traMADol (ULTRAM) 50 MG tablet Take 2 tablets (100 mg total) by mouth every 6 (six) hours as needed.   [DISCONTINUED] fluticasone (FLONASE) 50 MCG/ACT nasal spray Place 2 sprays into the nose daily.    No facility-administered encounter medications on file as of 03/14/2022.        Review of Systems   N/a   Physical Exam   BP 133/70   Resp 13   Ht 5' 10.5" (1.791 m)   Wt 106.6 kg   SpO2 96%   BMI 33.24 kg/m    General appearance: 82 y.o., male, NAD, conversant  Eyes: anicteric sclerae, moist conjunctivae; no lid-lag; PERRLA, tracking appropriately HENT: NCAT; oropharynx, MMM, no mucosal ulcerations; normal hard and soft palate Neck: Trachea midline; FROM, supple, lymphadenopathy, no JVD Lungs: diminshed breath sounds in right  CV: RRR, S1, S2, no MRGs  Abdomen: Soft, non-tender; non-distended, BS present  Extremities: No peripheral edema, radial and DP pulses present bilaterally  Skin: Normal temperature, turgor and texture; no rash Psych: Appropriate affect Neuro: Alert and oriented to person and place, no focal deficit       Assessment & Plan:    Recurrent right pleural effusion: Colon cancer    Plan:  Here today for planned right sided thoracentesis  He has stopped his plavix 5 days ago  Garner Nash,  DO [place] [facility] 07/14/2022 1:10 PM

## 2022-07-14 NOTE — Interval H&P Note (Signed)
History and Physical Interval Note:  07/14/2022 1:10 PM  Luis [name]  has presented today for surgery, with the diagnosis of pleural effusion.  The various methods of treatment have been discussed with the patient and family. After consideration of risks, benefits and other options for treatment, the patient has consented to  Procedure(s): THORACENTESIS (N/A) as a surgical intervention.  The patient's history has been reviewed, patient examined, no change in status, stable for surgery.  I have reviewed the patient's chart and labs.  Questions were answered to the patient's satisfaction.     [place]

## 2022-07-17 DIAGNOSIS — M545 Low back pain, unspecified: Secondary | ICD-10-CM | POA: Diagnosis not present

## 2022-07-18 LAB — CYTOLOGY - NON PAP

## 2022-07-19 ENCOUNTER — Encounter (HOSPITAL_COMMUNITY): Payer: Self-pay | Admitting: Pulmonary Disease

## 2022-07-19 ENCOUNTER — Ambulatory Visit: Payer: Medicare PPO | Admitting: Pulmonary Disease

## 2022-07-24 DIAGNOSIS — C180 Malignant neoplasm of cecum: Secondary | ICD-10-CM | POA: Diagnosis not present

## 2022-07-24 DIAGNOSIS — C182 Malignant neoplasm of ascending colon: Secondary | ICD-10-CM | POA: Diagnosis not present

## 2022-07-24 DIAGNOSIS — C189 Malignant neoplasm of colon, unspecified: Secondary | ICD-10-CM | POA: Diagnosis not present

## 2022-07-24 DIAGNOSIS — C787 Secondary malignant neoplasm of liver and intrahepatic bile duct: Secondary | ICD-10-CM | POA: Diagnosis not present

## 2022-07-24 DIAGNOSIS — R350 Frequency of micturition: Secondary | ICD-10-CM | POA: Diagnosis not present

## 2022-07-24 DIAGNOSIS — N401 Enlarged prostate with lower urinary tract symptoms: Secondary | ICD-10-CM | POA: Diagnosis not present

## 2022-07-24 DIAGNOSIS — Z5111 Encounter for antineoplastic chemotherapy: Secondary | ICD-10-CM | POA: Diagnosis not present

## 2022-07-24 DIAGNOSIS — J90 Pleural effusion, not elsewhere classified: Secondary | ICD-10-CM | POA: Diagnosis not present

## 2022-07-24 DIAGNOSIS — Z5112 Encounter for antineoplastic immunotherapy: Secondary | ICD-10-CM | POA: Diagnosis not present

## 2022-07-24 NOTE — Progress Notes (Signed)
Patty Sermons, please let patient know repeat thora was neg for malignancy. He has follow up with oncology soon as well.   Thanks,  BLI  Garner Nash, DO [facility] 07/24/2022 5:00 PM

## 2022-07-26 ENCOUNTER — Encounter: Payer: Self-pay | Admitting: Pulmonary Disease

## 2022-07-27 NOTE — Telephone Encounter (Signed)
Ok to decrease to 20 mg daily - if new script needed please send. Thanks!

## 2022-07-27 NOTE — Telephone Encounter (Signed)
Dr. Silas Flood, please advise on pt's message regarding Lasix. Thanks.

## 2022-07-28 MED ORDER — FUROSEMIDE 20 MG PO TABS: 20.0000 mg | ORAL_TABLET | Freq: Every day | ORAL | 2 refills | Status: DC

## 2022-07-31 DIAGNOSIS — M5416 Radiculopathy, lumbar region: Secondary | ICD-10-CM | POA: Diagnosis not present

## 2022-07-31 DIAGNOSIS — C787 Secondary malignant neoplasm of liver and intrahepatic bile duct: Secondary | ICD-10-CM | POA: Diagnosis not present

## 2022-07-31 DIAGNOSIS — C189 Malignant neoplasm of colon, unspecified: Secondary | ICD-10-CM | POA: Diagnosis not present

## 2022-07-31 DIAGNOSIS — H1013 Acute atopic conjunctivitis, bilateral: Secondary | ICD-10-CM | POA: Diagnosis not present

## 2022-07-31 DIAGNOSIS — M48061 Spinal stenosis, lumbar region without neurogenic claudication: Secondary | ICD-10-CM | POA: Diagnosis not present

## 2022-07-31 DIAGNOSIS — M25552 Pain in left hip: Secondary | ICD-10-CM | POA: Diagnosis not present

## 2022-08-01 ENCOUNTER — Ambulatory Visit: Payer: Medicare PPO | Admitting: Physician Assistant

## 2022-08-03 DIAGNOSIS — L905 Scar conditions and fibrosis of skin: Secondary | ICD-10-CM | POA: Diagnosis not present

## 2022-08-03 DIAGNOSIS — R197 Diarrhea, unspecified: Secondary | ICD-10-CM | POA: Diagnosis not present

## 2022-08-03 DIAGNOSIS — R5383 Other fatigue: Secondary | ICD-10-CM | POA: Diagnosis not present

## 2022-08-03 DIAGNOSIS — R6883 Chills (without fever): Secondary | ICD-10-CM | POA: Diagnosis not present

## 2022-08-03 DIAGNOSIS — C787 Secondary malignant neoplasm of liver and intrahepatic bile duct: Secondary | ICD-10-CM | POA: Diagnosis not present

## 2022-08-03 DIAGNOSIS — K5730 Diverticulosis of large intestine without perforation or abscess without bleeding: Secondary | ICD-10-CM | POA: Diagnosis not present

## 2022-08-03 DIAGNOSIS — C182 Malignant neoplasm of ascending colon: Secondary | ICD-10-CM | POA: Diagnosis not present

## 2022-08-03 DIAGNOSIS — E860 Dehydration: Secondary | ICD-10-CM | POA: Diagnosis not present

## 2022-08-03 DIAGNOSIS — C180 Malignant neoplasm of cecum: Secondary | ICD-10-CM | POA: Diagnosis not present

## 2022-08-03 DIAGNOSIS — R5381 Other malaise: Secondary | ICD-10-CM | POA: Diagnosis not present

## 2022-08-03 DIAGNOSIS — J90 Pleural effusion, not elsewhere classified: Secondary | ICD-10-CM | POA: Diagnosis not present

## 2022-08-03 DIAGNOSIS — C189 Malignant neoplasm of colon, unspecified: Secondary | ICD-10-CM | POA: Diagnosis not present

## 2022-08-07 DIAGNOSIS — C787 Secondary malignant neoplasm of liver and intrahepatic bile duct: Secondary | ICD-10-CM | POA: Diagnosis not present

## 2022-08-07 DIAGNOSIS — R3915 Urgency of urination: Secondary | ICD-10-CM | POA: Diagnosis not present

## 2022-08-07 DIAGNOSIS — C182 Malignant neoplasm of ascending colon: Secondary | ICD-10-CM | POA: Diagnosis not present

## 2022-08-07 DIAGNOSIS — L905 Scar conditions and fibrosis of skin: Secondary | ICD-10-CM | POA: Diagnosis not present

## 2022-08-07 DIAGNOSIS — D869 Sarcoidosis, unspecified: Secondary | ICD-10-CM | POA: Diagnosis not present

## 2022-08-07 DIAGNOSIS — E876 Hypokalemia: Secondary | ICD-10-CM | POA: Diagnosis not present

## 2022-08-07 DIAGNOSIS — Z79899 Other long term (current) drug therapy: Secondary | ICD-10-CM | POA: Diagnosis not present

## 2022-08-07 DIAGNOSIS — C180 Malignant neoplasm of cecum: Secondary | ICD-10-CM | POA: Diagnosis not present

## 2022-08-07 DIAGNOSIS — J90 Pleural effusion, not elsewhere classified: Secondary | ICD-10-CM | POA: Diagnosis not present

## 2022-08-08 DIAGNOSIS — M48061 Spinal stenosis, lumbar region without neurogenic claudication: Secondary | ICD-10-CM | POA: Diagnosis not present

## 2022-08-11 MED ORDER — AZITHROMYCIN 250 MG PO TABS: ORAL_TABLET | ORAL | 0 refills | Status: DC

## 2022-08-11 MED ORDER — PREDNISONE 20 MG PO TABS: 40.0000 mg | ORAL_TABLET | Freq: Every day | ORAL | 0 refills | Status: DC

## 2022-08-14 ENCOUNTER — Encounter: Payer: Self-pay | Admitting: Physician Assistant

## 2022-08-14 ENCOUNTER — Ambulatory Visit: Payer: Medicare PPO | Attending: Interventional Cardiology | Admitting: Physician Assistant

## 2022-08-14 VITALS — BP 122/58 | HR 67 | Ht 70.0 in | Wt 240.0 lb

## 2022-08-14 DIAGNOSIS — I440 Atrioventricular block, first degree: Secondary | ICD-10-CM | POA: Diagnosis not present

## 2022-08-14 DIAGNOSIS — I6521 Occlusion and stenosis of right carotid artery: Secondary | ICD-10-CM

## 2022-08-14 DIAGNOSIS — R0609 Other forms of dyspnea: Secondary | ICD-10-CM | POA: Diagnosis not present

## 2022-08-14 DIAGNOSIS — I2510 Atherosclerotic heart disease of native coronary artery without angina pectoris: Secondary | ICD-10-CM

## 2022-08-14 DIAGNOSIS — E782 Mixed hyperlipidemia: Secondary | ICD-10-CM | POA: Diagnosis not present

## 2022-08-14 DIAGNOSIS — I10 Essential (primary) hypertension: Secondary | ICD-10-CM | POA: Diagnosis not present

## 2022-08-14 DIAGNOSIS — R002 Palpitations: Secondary | ICD-10-CM

## 2022-08-14 DIAGNOSIS — I6522 Occlusion and stenosis of left carotid artery: Secondary | ICD-10-CM

## 2022-08-14 NOTE — Progress Notes (Signed)
Office Visit    Patient Name: Luis [name] Date of Encounter: 08/14/2022  PCP:  [name], MD   [place]  Cardiologist:  Larae Grooms, MD  Advanced Practice Provider:  No care team member to display Electrophysiologist:  None 6}  HPI    Luis [name] is a 82 y.o. male with a past medical history of CAD, HTN, HLD, carotid stenosis, and adenocarcinoma of the colon presents today for follow-up visit.  History of CAD to diagonal 08/2014 with ramus intermedius totally occluded at that time.  He eventually underwent CTO PCI with overlapping DES x 2 to the ramus 02/2015.  Chest pain 05/2016 relieved with nitroglycerin, off Ranexa NST 12/2017 with normal study, normal LVEF.  Found to have totally occluded right carotid on Doppler 04/2018 but no other stenosis.  Adenocarcinoma of the colon status post colectomy.  He underwent cardiac catheterization 01/02/2023 with chief complaint of DOE and left arm pain which was relieved with rest.  Was walking 3 miles a day but had cut to 1-1/2 miles because of shortness of breath, no chest tightness.  Had gained 10 to 12 pounds.  Received DES to LAD and balloon angioplasty to the diagonal bifurcation.  Plan for aspirin/Plavix for 6 months.  He was seen at the [facility] cancer center, chemotherapy for adenocarcinoma of the colon with liver mets then surgery once tumor shrunk.  He decided not to have surgery so he was treated with chemotherapy and will have an ablation of the tumor of the liver.  Had an episode of chest pain, was seen by Dr. Martinique at the hospital and troponin was negative.  EKG unchanged.  Consideration of repeat cath if symptoms persist.  Negative workup in the ED 01/2021, hemoglobin was 9.6.  Noted at [facility] that he had used additional diuretics for pleural effusion and shortness of breath.  CXR on 01/22/2021 showed significant improvement in right-sided pleural effusion.  He was seen by Dr. Emeterio Reeve 08/01/2021 at  the time DOE persisted and consideration was given to further ischemic evaluation.  He underwent a thoracentesis 08/16/2021 with [facility] pulmonology.  He called our office 02/08/2022 with intermittent episodes of elevated heart rate x 2.  Heart rate was previously 110 bpm with walking now up to 145 bpm and taking longer to recover.  Also noted DOE.  No chest pain.  Was advised to keep his 38-monthfollow-up appointment.   He was seen 02/08/2022 for evaluation of elevated heart rate.  Episode started prior to starting prednisone which he started for persistent rash.  Reported DOE, feeling tired, wakes up in the middle of the night with chest pain that usually resolves in a few minutes.  Chest pain occurred at other times as well however by the time he thinks about it is gone.  It is not specifically associated with worsening DOE.  He was feeling like there was fluid in his lungs but was not limited to his breathing.  Heart rate ranging from low 60s to 150s per his smart watch.  Lexiscan Myoview was ordered to rule out ischemia 02/10/2022 which revealed no ischemia or infarct, EF 63%.  03/13/2022 cardiac monitor revealed normal sinus rhythm with frequent PACs, no atrial fibrillation, no significant arrhythmias.  Advised to start metoprolol succinate 50 mg each evening.  He was then seen 04/18/2022 and continued to have DOE.  Feels that it was worse since his last visit.  This was his only symptom of angina prior to  stenting in 2020.  Palpitations have improved.  He was feeling fatigued and thought it could be a side effect of his metoprolol.  Recently cleared by pulmonology, having more time in between needing thoracentesis.  Remains active working at [employer], no formal exercise.  Blood pressure was generally well-controlled 125 to [id number] mmHg systolic.  Today, he tells me that he has cut down on his Ranexa to once a day.  He has not had any further chest pain or shortness of breath.  His blood pressure has been "low" which  was [id number] systolic.  He thinks he is on too much medication.  He has been taking Lasix 20 mg daily for a pleural effusion.  He says this started around last summer when he had his cancer removed.  Otherwise, he has been doing well from a cardiovascular standpoint.  Blood pressure well-controlled today at 122/58.  Reports no shortness of breath nor dyspnea on exertion. Reports no chest pain, pressure, or tightness. No edema, orthopnea, PND. Reports no palpitations.    What other things past Medical History    Past Medical History:  Diagnosis Date   Adenocarcinoma of cecum ([place]) 06/21/2017   Arthritis    "mid back; hands; knees" (02/24/2015)   Asthmatic bronchitis with acute exacerbation 06/27/2021   Basal cell carcinoma    left shoulder; mid chest; right eyelid (02/24/2015)   CAD (coronary artery disease)    a. 08/2014 Cath/PCI: LM nl, LAD 30p, D1 95 (2.25x12 Resolute Integrity DES), LCX small, RI 100 (attempted PCI) - branches fill via L->L collats, RCA dominant, nl, RPDA/PLA nl, EF 60^. b. 02/24/2015 PCI CTO of Ramus DES x2.   Chronic bronchitis ([place])    hx   Diverticulosis 05/2005   Elevated lipase    Fuchs' corneal dystrophy    History of adenomatous polyp of colon 05/2005   8 mm adenoma   History of blood transfusion    "related to some of my surgeries"   Hyperlipidemia    Hypertension    Iron deficiency anemia due to chronic blood loss 06/21/2017   Pneumonia    Prostate cancer ([place]) 2011   S/P seed implant   Sarcoidosis    Thrombocytopenia ([place])    a. Noted on prior labs, unclear of what w/u done.   Trifascicular block    Past Surgical History:  Procedure Laterality Date   BASAL CELL CARCINOMA EXCISION Left    shoulder   CARDIAC CATHETERIZATION N/A 02/24/2015   Procedure: Coronary/Bypass Graft CTO Intervention;  Surgeon: Jettie Booze, MD;  Location: [place] [facility];  Service: Cardiovascular;  Laterality: N/A;   CARDIAC CATHETERIZATION  02/24/2015   Procedure:  Coronary/Graft Atherectomy;  Surgeon: Jettie Booze, MD;  Location: [place] [facility];  Service: Cardiovascular;;   CATARACT EXTRACTION W/ INTRAOCULAR LENS  IMPLANT, BILATERAL Bilateral ~ 2011-2012   COLONOSCOPY W/ POLYPECTOMY  06/08/2005 and 10/18/2010   8 mm adenoma 2006, none 2012. Diverticulosis and internal hemorrhoids.   CORNEAL TRANSPLANT Bilateral ~ 2011-2012   "@ same time as cataract OR"   CORONARY ANGIOPLASTY WITH STENT PLACEMENT  08/2014; 02/24/2015   "1 stent + 1 stent"   CORONARY STENT INTERVENTION N/A 01/02/2019   Procedure: CORONARY STENT INTERVENTION;  Surgeon: Jettie Booze, MD;  Location: [place] [facility];  Service: Cardiovascular;  Laterality: N/A;   EYE SURGERY     FINGER SURGERY Right 2014   "reattached middle finger"   [place]  ~ 2011   IR  IMAGING GUIDED PORT INSERTION  03/04/2019   IR RADIOLOGIST EVAL & MGMT  05/26/2020   IR US GUIDE BX ASP/DRAIN  03/04/2019   LAPAROSCOPIC CHOLECYSTECTOMY  2015   LAPAROSCOPIC PARTIAL COLECTOMY N/A 07/26/2017   Procedure: LAPAROSCOPIC PARTIAL COLECTOMY, EXCISION SCROTAL CYST;  Surgeon: Greer Pickerel, MD;  Location: [facility];  Service: General;  Laterality: N/A;   LEFT HEART CATH AND CORONARY ANGIOGRAPHY N/A 01/02/2019   Procedure: LEFT HEART CATH AND CORONARY ANGIOGRAPHY;  Surgeon: Jettie Booze, MD;  Location: [place] [facility];  Service: Cardiovascular;  Laterality: N/A;   LEFT HEART CATHETERIZATION WITH CORONARY ANGIOGRAM N/A 08/12/2014   Procedure: LEFT HEART CATHETERIZATION WITH CORONARY ANGIOGRAM;  Surgeon: Blane Ohara, MD;  Location: [facility] CATH LAB;  Service: Cardiovascular;  Laterality: N/A;   LYMPH NODE BIOPSY     mid chest   MOHS SURGERY Right ~ 2011   "eyelid; for basal cell"   RADIOLOGY WITH ANESTHESIA N/A 06/30/2020   Procedure: IR WITH ANESTHESIA MICROWAVE ABLATION;  Surgeon: Corrie Mckusick, DO;  Location: [facility];  Service: Anesthesiology;  Laterality: N/A;   RIGHT/LEFT HEART CATH  AND CORONARY ANGIOGRAPHY N/A 04/25/2022   Procedure: RIGHT/LEFT HEART CATH AND CORONARY ANGIOGRAPHY;  Surgeon: Jettie Booze, MD;  Location: [place] [facility];  Service: Cardiovascular;  Laterality: N/A;   THORACENTESIS N/A 02/11/2021   Procedure: Mathews Robinsons;  Surgeon: Lanier Clam, MD;  Location: [facility] ENDOSCOPY;  Service: Pulmonary;  Laterality: N/A;   THORACENTESIS Right 04/04/2021   Procedure: THORACENTESIS;  Surgeon: Candee Furbish, MD;  Location: [facility] ENDOSCOPY;  Service: Pulmonary;  Laterality: Right;   THORACENTESIS Right 08/16/2021   Procedure: THORACENTESIS;  Surgeon: Jacky Kindle, MD;  Location: [facility] ENDOSCOPY;  Service: Pulmonary;  Laterality: Right;   THORACENTESIS Right 11/29/2021   Procedure: THORACENTESIS;  Surgeon: [name], MD;  Location: [facility] ENDOSCOPY;  Service: Pulmonary;  Laterality: Right;   THORACENTESIS N/A 03/17/2022   Procedure: Mathews Robinsons;  Surgeon: Lanier Clam, MD;  Location: [facility] ENDOSCOPY;  Service: Pulmonary;  Laterality: N/A;   THORACENTESIS N/A 07/14/2022   Procedure: Mathews Robinsons;  Surgeon: Garner Nash, DO;  Location: [place] ENDOSCOPY;  Service: Pulmonary;  Laterality: N/A;   THOROCOTOMY WITH LOBECTOMY Right ~ 1991   partial removal right lung   TONSILLECTOMY  ~ 1950    Allergies  No Known Allergies   EKGs/Labs/Other Studies Reviewed:   The following studies were reviewed today: Cardiac monitor 03/13/22   Normal sinus rhythm with frequent PACs and rare PVCs.   Brief runs of PACs, lastig just a few seconds, and not assocaited with symptoms.   Patient symptoms correlated to PACs.   No sustained pathologic arrhythmias.   [place] 02/10/22     The study is normal. The study is low risk.   No ST deviation was noted.   LV perfusion is normal. There is no evidence of ischemia. There is no evidence of infarction.   Left ventricular function is normal. Nuclear stress EF: 63 %. The left ventricular ejection fraction is  normal (55-65%). End diastolic cavity size is normal. End systolic cavity size is normal.   Prior study available for comparison from 01/02/2018.   Normal resting and stress perfusion. No ischemia or infarction EF 63%    Echo 05/29/21   1. Left ventricular ejection fraction, by estimation, is 60 to 65%. The  left ventricle has normal function. The left ventricle has no regional  wall motion abnormalities. There is mild concentric left ventricular  hypertrophy.  Left ventricular diastolic  parameters are consistent with Grade I diastolic dysfunction (impaired  relaxation).   2. Right ventricular systolic function is normal. The right ventricular  size is normal.   3. The mitral valve is normal in structure. No evidence of mitral valve  regurgitation. No evidence of mitral stenosis.   4. The aortic valve is tricuspid. There is mild calcification of the  aortic valve. There is mild thickening of the aortic valve. Aortic valve  regurgitation is not visualized. Aortic valve sclerosis is present, with  no evidence of aortic valve stenosis.   5. There is mild dilatation of the ascending aorta, measuring 40 mm.   6. The inferior vena cava is normal in size with greater than 50%  respiratory variability, suggesting right atrial pressure of 3 mmHg.    Carotid Duplex 05/18/21  Comparison(s): No significant change from prior study. Prior images  reviewed side by side.    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   Left Carotid: Velocities in the left ICA are consistent with a 1-39%  stenosis.   Vertebrals: Bilateral vertebral arteries demonstrate antegrade flow.  Subclavians: Normal flow hemodynamics were seen in bilateral subclavian               arteries.   *See table(s) above for measurements and observations.  Suggest follow up study in 12 months.    [facility] 01/02/19   Patent diagonal stent. Previously placed Ramus drug eluting stent is widely patent. Prox LAD lesion is  75% stenosed. A drug-eluting stent was successfully placed using a STENT SYNERGY DES 3X24. Post intervention, there is a 0% residual stenosis. 1st Diag-1 lesion is 75% stenosed. Balloon angioplasty was performed using a BALLOON SAPPHIRE 2.0X12.[name] Post intervention, there is a 40% residual stenosis. The left ventricular systolic function is normal. LV end diastolic pressure is mildly elevated. LVEDP 15 mm Hg. The left ventricular ejection fraction is 55-65% by visual estimate. There is no aortic valve stenosis.   Successful PCI of the LAD diagonal bifurcation.  Continue medical therapy as well.    Possible same day PCI candidate.   Diagnostic Dominance: Right  Intervention           EKG:  EKG is not ordered today.   Recent Labs: 09/09/2021: BUN 15; Creatinine, Ser 0.80 04/25/2022: Hemoglobin 11.6; Hemoglobin 11.9; Potassium 3.8; Potassium 3.8; Sodium 137; Sodium 137  Recent Lipid Panel    Component Value Date/Time   CHOL 176 04/24/2022 0758   TRIG 75 04/24/2022 0758   HDL 72 04/24/2022 0758   CHOLHDL 2.4 04/24/2022 0758   CHOLHDL 3.2 10/13/2015 1005   VLDL 37 (H) 10/13/2015 1005   LDLCALC 90 04/24/2022 0758   Home Medications   Current Meds  Medication Sig   acetaminophen (TYLENOL) 500 MG tablet Take 500-1,000 mg by mouth every 6 (six) hours as needed for moderate pain.   albuterol (VENTOLIN HFA) 108 (90 Base) MCG/ACT inhaler INHALE 1-2 PUFFS BY MOUTH EVERY 6 HOURS AS NEEDED FOR WHEEZE OR SHORTNESS OF BREATH   cetirizine (ZYRTEC) 10 MG tablet TAKE 1 TABLET BY MOUTH EVERY DAY (Patient taking differently: Take 10 mg by mouth as needed for allergies.)   Cholecalciferol (VITAMIN D3) 2000 units capsule Take 2,000 Units by mouth daily.   clopidogrel (PLAVIX) 75 MG tablet TAKE 1 TABLET BY MOUTH EVERY DAY   ezetimibe (ZETIA) 10 MG tablet Take 1 tablet (10 mg total) by mouth daily.   fluticasone (FLONASE) 50 MCG/ACT nasal spray  SPRAY 2 SPRAYS INTO EACH NOSTRIL EVERY DAY    furosemide (LASIX) 20 MG tablet Take 1 tablet (20 mg total) by mouth daily.   gabapentin (NEURONTIN) 100 MG capsule Take 200 mg by mouth at bedtime as needed (neuropathy).   gabapentin (NEURONTIN) 300 MG capsule Take 300 mg by mouth as needed (neuropathy).   ibuprofen (ADVIL) 200 MG tablet Take 200-400 mg by mouth every 6 (six) hours as needed for moderate pain.   metaxalone (SKELAXIN) 800 MG tablet Take 800 mg by mouth as needed for muscle spasms.   metoprolol succinate (TOPROL XL) 50 MG 24 hr tablet Take 1 tablet (50 mg total) by mouth every evening. Take with or immediately following a meal.   metroNIDAZOLE (METROCREAM) 0.75 % cream Apply 1 Application topically daily as needed (rash).   minocycline (MINOCIN) 100 MG capsule Take by mouth daily. On HOLD   Multiple Vitamins-Minerals (VITRUM SENIOR) TABS Take 1 tablet by mouth daily.   naproxen sodium (ALEVE) 220 MG tablet Take 440 mg by mouth 2 (two) times daily as needed (pain).   nitroGLYCERIN (NITROSTAT) 0.4 MG SL tablet PLACE 1 TABLET UNDER THE TONGUE EVERY 5 MINUTES AS NEEDED FOR CHEST PAIN FOR 3 DOSES   rosuvastatin (CRESTOR) 20 MG tablet Take 1 tablet (20 mg total) by mouth daily.   traMADol (ULTRAM) 50 MG tablet Take 2 tablets (100 mg total) by mouth every 6 (six) hours as needed.   [DISCONTINUED] ranolazine (RANEXA) 500 MG 12 hr tablet TAKE 1 TABLET BY MOUTH TWICE A DAY (Patient taking differently: Take 500 mg by mouth daily.)     Review of Systems      All other systems reviewed and are otherwise negative except as noted above.  Physical Exam    VS:  BP (!) 122/58   Pulse 67   Ht '5\' 10"'$  (1.778 m)   Wt 240 lb (108.9 kg)   SpO2 97%   BMI 34.44 kg/m  , BMI Body mass index is 34.44 kg/m.  Wt Readings from Last 3 Encounters:  08/14/22 240 lb (108.9 kg)  07/14/22 235 lb (106.6 kg)  04/25/22 234 lb (106.1 kg)     GEN: Well nourished, well developed, in no acute distress. HEENT: normal. Neck: Supple, no JVD, carotid bruits,  or masses. Cardiac: RRR, no murmurs, rubs, or gallops. No clubbing, cyanosis, edema.  Radials/PT 2+ and equal bilaterally.  Respiratory:  Respirations regular and unlabored, clear to auscultation bilaterally. GI: Soft, nontender, nondistended. MS: No deformity or atrophy. Skin: Warm and dry, no rash. Neuro:  Strength and sensation are intact. Psych: Normal affect.  Assessment & Plan     CAD -No chest pain today -Continue current medication regimen which includes amlodipine 5 mg daily, Plavix 75 mg daily, Zetia 10 mg daily, Lasix 20 mg daily, metoprolol succinate 50 mg daily, nitro as needed, Ranexa 500 mg daily (discontinue), Crestor 20 mg daily -The patient believes she is on too much medication so we have discontinued Ranexa and continued metoprolol succinate 50 mg daily and asked him to keep track of his blood pressure and heart rate -update echo  Hyperlipidemia LDL goal less than 70 -Most up-to-date lipid panel with LDL 90 (04/2022) -This remains above goal. -Continue Crestor and lipid lowering diet -May need to increase Crestor at next office appointment if LDL remains elevated  Stenosis of the left carotid artery, total occlusion of the right internal carotid and central carotid -Most recent carotid ultrasound 12/23 reviewed -No bruit on exam -Will  plan for annual carotid ultrasounds  Hypertension -BP well-controlled today -Continue current medication regimen        Disposition: Follow up 6 months with Larae Grooms, MD or APP.  Signed, Elgie Collard, PA-C 08/14/2022, 5:31 PM [facility]

## 2022-08-14 NOTE — Patient Instructions (Signed)
Medication Instructions:   STOP TAKING: RENEXA  500 MG   *If you need a refill on your cardiac medications before your next appointment, please call your pharmacy*   Lab Work:  PLEASE REQUEST FASTING CHOLESTEROL /LIVER  LABS FROM PRIMARY PROVIDER  AND FAX TO [place]   [phone]   ATTN: TESSA CONTE   If you have labs (blood work) drawn today and your tests are completely normal, you will receive your results only by: [place] (if you have MyChart) OR A paper copy in the mail If you have any lab test that is abnormal or we need to change your treatment, we will call you to review the results.   Testing/Procedures: Your physician has requested that you have an echocardiogram. Echocardiography is a painless test that uses sound waves to create images of your heart. It provides your doctor with information about the size and shape of your heart and how well your heart's chambers and valves are working. This procedure takes approximately one hour. There are no restrictions for this procedure. Please do NOT wear cologne, perfume, aftershave, or lotions (deodorant is allowed). Please arrive 15 minutes prior to your appointment time.    Follow-Up: At [facility], you and your health needs are our priority.  As part of our continuing mission to provide you with exceptional heart care, we have created designated Provider Care Teams.  These Care Teams include your primary Cardiologist (physician) and Advanced Practice Providers (APPs -  Physician Assistants and Nurse Practitioners) who all work together to provide you with the care you need, when you need it.  We recommend signing up for the patient portal called "MyChart".  Sign up information is provided on this After Visit Summary.  MyChart is used to connect with patients for Virtual Visits (Telemedicine).  Patients are able to view lab/test results, encounter notes, upcoming appointments, etc.  Non-urgent messages can be sent to  your provider as well.   To learn more about what you can do with MyChart, go to [email or web address].    Your next appointment:   6 month(s)  Provider:   Larae Grooms, MD     Other Instructions

## 2022-08-17 ENCOUNTER — Encounter: Payer: Self-pay | Admitting: Pulmonary Disease

## 2022-08-17 ENCOUNTER — Ambulatory Visit: Payer: Medicare PPO | Admitting: Pulmonary Disease

## 2022-08-17 VITALS — BP 126/72 | HR 61 | Wt 241.6 lb

## 2022-08-17 DIAGNOSIS — J4521 Mild intermittent asthma with (acute) exacerbation: Secondary | ICD-10-CM | POA: Diagnosis not present

## 2022-08-17 DIAGNOSIS — J90 Pleural effusion, not elsewhere classified: Secondary | ICD-10-CM

## 2022-08-17 MED ORDER — PREDNISONE 20 MG PO TABS: 40.0000 mg | ORAL_TABLET | Freq: Every day | ORAL | 0 refills | Status: AC

## 2022-08-17 MED ORDER — AZITHROMYCIN 250 MG PO TABS: 250.0000 mg | ORAL_TABLET | Freq: Every day | ORAL | 0 refills | Status: DC

## 2022-08-17 NOTE — Progress Notes (Signed)
$'@Patient'W$  ID: Luis [name], male    DOB: 26-May-1941, 82 y.o.   MRN: [id number]  Chief Complaint  Patient presents with   Follow-up    Pt states that he is still having a little bit of SOB. But he thinks a few more days of ABT and prednisone would help. He states in the past it takes a few days to knock it out but he is feeling a little bit better.     Referring provider: [name], MD  HPI:   82 y.o. man whom we are seeing in follow-up of multiple pulmonary issues including burnt out sarcoid, asthma, ongoing chronic of right pleural effusion.  Most recent oncology note from [facility] reviewed.  Sent message last week.  Worsening congestion.  Concern for asthma flare.  Felt a little bit different from normal fluid buildup he said.  Sent in prednisone 40 mg daily and azithromycin, Z-Pak.  Has had marked improvement.  Still with existing congestion.  Wheezing on exam.   HPI at initial visit: Overall, disorders are largely unchanged.  Formally followed with [name] for sarcoidosis.  This has been quiescent for some time.  Most recent CT scan reviewed which reveals on my interpretation stable bilateral fibrosis, scattered calcified granulomas in the lung as well as significant calcified hilar and mediastinal lymphadenopathy and sequela of prior lung surgery.  He has been seen in clinic recently by [name].  Primary complaint was dyspnea on exertion as well as cough.  These persist.  Chest imaging obtained as above.  PFTs obtained last week which reveal suggestion of mild to moderate restriction on spirometry without significant bronchodilator response.  Moderate restriction confirmed with total lung capacity 69% of predicted.  DLCO corrects to within normal limits with adjustment for hemoglobin.  Cough and shortness of breath coincide with worsening nasal congestion.  Postnasal drip.  Not really using meds to address this this.  He is tolerating chemotherapy well.  Review of labs  indicate stable hemoglobin without concern for symptomatic anemia at this time.   Questionaires / Pulmonary Flowsheets:   ACT:      No data to display           MMRC: mMRC Dyspnea Scale mMRC Score  08/09/2020  3:42 PM 0    [place]worth:      No data to display           Tests:   FENO:  No results found for: "NITRICOXIDE"  PFT:    Latest Ref Rng & Units 03/10/2020    3:36 PM 08/30/2017    3:59 PM  PFT Results  FVC-Pre L 2.94  P 3.14   FVC-Predicted Pre % 68  P 72   FVC-Post L 2.90  P 3.18   FVC-Predicted Post % 67  P 73   Pre FEV1/FVC % % 66  P 70   Post FEV1/FCV % % 68  P 73   FEV1-Pre L 1.93  P 2.19   FEV1-Predicted Pre % 62  P 69   FEV1-Post L 1.96  P 2.31   DLCO uncorrected ml/min/mmHg 19.10  P 17.73   DLCO UNC% % 75  P 52   DLCO corrected ml/min/mmHg 21.40  P 20.68   DLCO COR %Predicted % 84  P 61   DLVA Predicted % 118  P 98   TLC L 5.02  P 4.99   TLC % Predicted % 69  P 68   RV % Predicted % 76  P 71     P Preliminary result   Personally reviewed and interpreted as normal spirometry, reduced DLCO, moderate reduced TLC  WALK:     08/28/2017   12:00 PM  SIX MIN WALK  Supplimental Oxygen during Test? (L/min) No  Tech Comments: pt walked a fast pace, tolerated walk well.     Imaging: Personally reviewed and as per EMR and discussion in this note  No results found.  Lab Results: Personally reviewed, eosinophils routinely 200-300 CBC    Component Value Date/Time   WBC 4.5 08/01/2021 0857   WBC 4.7 05/29/2021 0359   RBC 4.83 08/01/2021 0857   RBC 4.05 (L) 05/29/2021 0359   HGB 11.6 (L) 04/25/2022 0802   HGB 11.9 (L) 04/25/2022 0802   HGB 12.7 (L) 08/01/2021 0857   HCT 34.0 (L) 04/25/2022 0802   HCT 35.0 (L) 04/25/2022 0802   HCT 40.6 08/01/2021 0857   PLT 171 08/01/2021 0857   MCV 84 08/01/2021 0857   MCH 26.3 (L) 08/01/2021 0857   MCH 25.2 (L) 05/29/2021 0359   MCHC 31.3 (L) 08/01/2021 0857   MCHC 30.8 05/29/2021 0359   RDW 20.1  (H) 08/01/2021 0857   LYMPHSABS 0.4 (L) 05/29/2021 0054   MONOABS 1.0 05/29/2021 0054   EOSABS 0.1 05/29/2021 0054   BASOSABS 0.0 05/29/2021 0054    BMET    Component Value Date/Time   NA 137 04/25/2022 0802   NA 137 04/25/2022 0802   NA 141 09/09/2021 1719   K 3.8 04/25/2022 0802   K 3.8 04/25/2022 0802   CL 99 09/09/2021 1719   CO2 24 09/09/2021 1719   GLUCOSE 100 (H) 09/09/2021 1719   GLUCOSE 112 (H) 05/29/2021 0359   BUN 15 09/09/2021 1719   CREATININE 0.80 09/09/2021 1719   CREATININE 0.83 06/07/2020 1119   CREATININE 1.02 05/16/2016 0937   CALCIUM 9.5 09/09/2021 1719   GFRNONAA >60 05/29/2021 0359   GFRNONAA >60 06/07/2020 1119   GFRAA >60 03/10/2020 0855    BNP    Component Value Date/Time   BNP 79.4 01/22/2021 1634   BNP 43.4 05/16/2016 0937    ProBNP    Component Value Date/Time   PROBNP 69 08/01/2021 0857   PROBNP 60.0 08/10/2014 1052    Specialty Problems       Pulmonary Problems   Pulmonary fibrosis (HCC)   DOE (dyspnea on exertion)   Asthmatic bronchitis with acute exacerbation   Pleural effusion    No Known Allergies  Immunization History  Administered Date(s) Administered   DTaP / IPV 09/25/2012   Fluad Quad(high Dose 65+) 02/19/2019, 03/14/2022   Influenza Split 03/27/2014   Influenza, High Dose Seasonal PF 03/27/2014, 01/27/2017, 02/22/2018, 03/12/2021   Influenza-Unspecified 02/24/2017, 03/10/2020   PFIZER Comirnaty(Gray Top)Covid-19 Tri-Sucrose Vaccine 06/27/2019, 07/21/2019   PFIZER(Purple Top)SARS-COV-2 Vaccination 07/11/2019, 07/21/2019, 01/26/2020, 10/26/2020   Pneumococcal Conjugate-13 03/27/2014   Pneumococcal Polysaccharide-23 11/10/2009   Pneumococcal-Unspecified 03/12/2014   Tdap 03/24/2011, 09/25/2012, 05/28/2021   Zoster Recombinat (Shingrix) 09/01/2018, 02/19/2019   Zoster, Live 03/21/2010    Past Medical History:  Diagnosis Date   Adenocarcinoma of cecum ([place]) 06/21/2017   Arthritis    "mid back; hands; knees"  (02/24/2015)   Asthmatic bronchitis with acute exacerbation 06/27/2021   Basal cell carcinoma    left shoulder; mid chest; right eyelid (02/24/2015)   CAD (coronary artery disease)    a. 08/2014 Cath/PCI: LM nl, LAD 30p, D1 95 (2.25x12 Resolute Integrity DES), LCX small, RI 100 (attempted PCI) -  branches fill via L->L collats, RCA dominant, nl, RPDA/PLA nl, EF 60^. b. 02/24/2015 PCI CTO of Ramus DES x2.   Chronic bronchitis ([place])    hx   Diverticulosis 05/2005   Elevated lipase    Fuchs' corneal dystrophy    History of adenomatous polyp of colon 05/2005   8 mm adenoma   History of blood transfusion    "related to some of my surgeries"   Hyperlipidemia    Hypertension    Iron deficiency anemia due to chronic blood loss 06/21/2017   Pneumonia    Prostate cancer ([place]) 2011   S/P seed implant   Sarcoidosis    Thrombocytopenia ([place])    a. Noted on prior labs, unclear of what w/u done.   Trifascicular block     Tobacco History: Social History   Tobacco Use  Smoking Status Light Smoker   Packs/day: 0.00   Years: 59.00   Total pack years: 0.00   Types: Cigars, Cigarettes   Last attempt to quit: 02/24/2015   Years since quitting: 7.4  Smokeless Tobacco Never  Tobacco Comments   02/24/2015 "quit cigarettes ~ 2000 ago but smokes cigars about once every two months. ALS 03/14/2022   Ready to quit: Not Answered Counseling given: Not Answered Tobacco comments: 02/24/2015 "quit cigarettes ~ 2000 ago but smokes cigars about once every two months. ALS 03/14/2022   Continue to not smoke  Outpatient Encounter Medications as of 08/17/2022  Medication Sig   acetaminophen (TYLENOL) 500 MG tablet Take 500-1,000 mg by mouth every 6 (six) hours as needed for moderate pain.   albuterol (VENTOLIN HFA) 108 (90 Base) MCG/ACT inhaler INHALE 1-2 PUFFS BY MOUTH EVERY 6 HOURS AS NEEDED FOR WHEEZE OR SHORTNESS OF BREATH   azithromycin (ZITHROMAX) 250 MG tablet Take 1 tablet (250 mg total) by mouth daily.    cetirizine (ZYRTEC) 10 MG tablet TAKE 1 TABLET BY MOUTH EVERY DAY (Patient taking differently: Take 10 mg by mouth as needed for allergies.)   Cholecalciferol (VITAMIN D3) 2000 units capsule Take 2,000 Units by mouth daily.   clopidogrel (PLAVIX) 75 MG tablet TAKE 1 TABLET BY MOUTH EVERY DAY   ezetimibe (ZETIA) 10 MG tablet Take 1 tablet (10 mg total) by mouth daily.   fluticasone (FLONASE) 50 MCG/ACT nasal spray SPRAY 2 SPRAYS INTO EACH NOSTRIL EVERY DAY   furosemide (LASIX) 20 MG tablet Take 1 tablet (20 mg total) by mouth daily. (Patient taking differently: Take 40 mg by mouth daily.)   gabapentin (NEURONTIN) 300 MG capsule Take 300 mg by mouth as needed (neuropathy).   ibuprofen (ADVIL) 200 MG tablet Take 200-400 mg by mouth every 6 (six) hours as needed for moderate pain.   metaxalone (SKELAXIN) 800 MG tablet Take 800 mg by mouth as needed for muscle spasms.   metoprolol succinate (TOPROL XL) 50 MG 24 hr tablet Take 1 tablet (50 mg total) by mouth every evening. Take with or immediately following a meal.   metroNIDAZOLE (METROCREAM) 0.75 % cream Apply 1 Application topically daily as needed (rash).   Multiple Vitamins-Minerals (VITRUM SENIOR) TABS Take 1 tablet by mouth daily.   naproxen sodium (ALEVE) 220 MG tablet Take 440 mg by mouth 2 (two) times daily as needed (pain).   nitroGLYCERIN (NITROSTAT) 0.4 MG SL tablet PLACE 1 TABLET UNDER THE TONGUE EVERY 5 MINUTES AS NEEDED FOR CHEST PAIN FOR 3 DOSES   predniSONE (DELTASONE) 20 MG tablet Take 2 tablets (40 mg total) by mouth daily with breakfast for 5  days.   rosuvastatin (CRESTOR) 20 MG tablet Take 1 tablet (20 mg total) by mouth daily.   traMADol (ULTRAM) 50 MG tablet Take 2 tablets (100 mg total) by mouth every 6 (six) hours as needed.   [DISCONTINUED] gabapentin (NEURONTIN) 100 MG capsule Take 200 mg by mouth at bedtime as needed (neuropathy).   [DISCONTINUED] minocycline (MINOCIN) 100 MG capsule Take by mouth daily. On HOLD   No  facility-administered encounter medications on file as of 08/17/2022.     Review of Systems  N/a  Physical Exam  BP 126/72 (BP Location: Left Arm, Patient Position: Sitting, Cuff Size: Normal)   Pulse 61   Wt 241 lb 9.6 oz (109.6 kg)   SpO2 97%   BMI 34.67 kg/m   Wt Readings from Last 5 Encounters:  08/17/22 241 lb 9.6 oz (109.6 kg)  08/14/22 240 lb (108.9 kg)  07/14/22 235 lb (106.6 kg)  04/25/22 234 lb (106.1 kg)  04/21/22 235 lb 12.8 oz (107 kg)    BMI Readings from Last 5 Encounters:  08/17/22 34.67 kg/m  08/14/22 34.44 kg/m  07/14/22 33.24 kg/m  04/25/22 33.10 kg/m  04/21/22 33.83 kg/m     Physical Exam General: Well-appearing, no acute distress Eyes: EOMI, no icterus Neck: No JVD supple Respiratory: Bilateral scattered mild end expiratory wheeze, diminished in the right base Cardiovascular: Regular rhythm, no murmurs Abdomen: Nondistended, bowel sounds present Psych: Normal mood, full affect   Assessment & Plan:   Recurrent right pleural effusion: Suspect related to local inflammation due to metastasis to liver, liver resection.  Cytology negative.  Improvement in dyspnea with drainage.  Serial thoracentesis as needed, will need to hold Plavix for 5 days in advance which she is well aware of and help coordinate.  Chest congestion: Suspect asthma.  Sarcoid per dialysis pending.  Recent improvement with prednisone and azithromycin course.  Will extend for additional few days given wheezing exam and ongoing congestion in chest.  Sarcoidosis: Burnt out, no evidence of active disease on recent imaging.  Return in about 3 months (around 11/17/2022).   Lanier Clam, MD 08/17/2022

## 2022-08-17 NOTE — Patient Instructions (Addendum)
Nice to see you again  I am glad the prednisone and antibiotic helped  I sent an additional few days of azithromycin and prednisone 40 mg daily  Return to clinic in 3 months or sooner as needed with Dr. Silas Flood

## 2022-08-21 ENCOUNTER — Inpatient Hospital Stay: Payer: Medicare PPO | Attending: Oncology | Admitting: Oncology

## 2022-08-21 DIAGNOSIS — J90 Pleural effusion, not elsewhere classified: Secondary | ICD-10-CM | POA: Diagnosis not present

## 2022-08-21 DIAGNOSIS — K769 Liver disease, unspecified: Secondary | ICD-10-CM | POA: Diagnosis not present

## 2022-08-21 DIAGNOSIS — J42 Unspecified chronic bronchitis: Secondary | ICD-10-CM | POA: Diagnosis not present

## 2022-08-21 DIAGNOSIS — C180 Malignant neoplasm of cecum: Secondary | ICD-10-CM | POA: Diagnosis not present

## 2022-08-21 DIAGNOSIS — C182 Malignant neoplasm of ascending colon: Secondary | ICD-10-CM | POA: Diagnosis not present

## 2022-08-21 DIAGNOSIS — C189 Malignant neoplasm of colon, unspecified: Secondary | ICD-10-CM | POA: Diagnosis not present

## 2022-08-21 DIAGNOSIS — Z923 Personal history of irradiation: Secondary | ICD-10-CM | POA: Diagnosis not present

## 2022-08-21 DIAGNOSIS — C787 Secondary malignant neoplasm of liver and intrahepatic bile duct: Secondary | ICD-10-CM | POA: Diagnosis not present

## 2022-08-21 DIAGNOSIS — Z79899 Other long term (current) drug therapy: Secondary | ICD-10-CM | POA: Diagnosis not present

## 2022-08-22 ENCOUNTER — Encounter: Payer: Self-pay | Admitting: *Deleted

## 2022-08-22 NOTE — Progress Notes (Signed)
Luis [name] did not make f/u here on 08/21/22 due to being seen at [facility]. Scheduling message sent to reschedule for 3-4 weeks.

## 2022-08-28 ENCOUNTER — Encounter: Payer: Self-pay | Admitting: Nurse Practitioner

## 2022-08-28 ENCOUNTER — Ambulatory Visit: Payer: Medicare PPO | Admitting: Nurse Practitioner

## 2022-08-28 ENCOUNTER — Ambulatory Visit (INDEPENDENT_AMBULATORY_CARE_PROVIDER_SITE_OTHER): Payer: Medicare PPO

## 2022-08-28 VITALS — BP 122/80 | HR 72 | Temp 98.3°F | Ht 70.5 in | Wt 235.0 lb

## 2022-08-28 DIAGNOSIS — R0602 Shortness of breath: Secondary | ICD-10-CM | POA: Diagnosis not present

## 2022-08-28 DIAGNOSIS — J441 Chronic obstructive pulmonary disease with (acute) exacerbation: Secondary | ICD-10-CM | POA: Diagnosis not present

## 2022-08-28 DIAGNOSIS — J439 Emphysema, unspecified: Secondary | ICD-10-CM | POA: Diagnosis not present

## 2022-08-28 DIAGNOSIS — D869 Sarcoidosis, unspecified: Secondary | ICD-10-CM

## 2022-08-28 DIAGNOSIS — J90 Pleural effusion, not elsewhere classified: Secondary | ICD-10-CM

## 2022-08-28 DIAGNOSIS — J45901 Unspecified asthma with (acute) exacerbation: Secondary | ICD-10-CM | POA: Diagnosis not present

## 2022-08-28 MED ORDER — ALBUTEROL SULFATE (2.5 MG/3ML) 0.083% IN NEBU: 2.5000 mg | INHALATION_SOLUTION | Freq: Four times a day (QID) | RESPIRATORY_TRACT | 5 refills | Status: DC | PRN

## 2022-08-28 MED ORDER — BREZTRI AEROSPHERE 160-9-4.8 MCG/ACT IN AERO: 2.0000 | INHALATION_SPRAY | Freq: Two times a day (BID) | RESPIRATORY_TRACT | 5 refills | Status: DC

## 2022-08-28 MED ORDER — IPRATROPIUM-ALBUTEROL 0.5-2.5 (3) MG/3ML IN SOLN
3.0000 mL | Freq: Once | RESPIRATORY_TRACT | Status: AC
  Administered 2022-08-28: 3 mL via RESPIRATORY_TRACT

## 2022-08-28 MED ORDER — PREDNISONE 10 MG PO TABS: ORAL_TABLET | ORAL | 0 refills | Status: DC

## 2022-08-28 NOTE — Assessment & Plan Note (Signed)
Burnt out. No active disease on imaging.

## 2022-08-28 NOTE — Assessment & Plan Note (Signed)
See above. If no improvement, will set him up with repeat thoracentesis.

## 2022-08-28 NOTE — Progress Notes (Signed)
@Patient  ID: Luis [name], male    DOB: [birth year], 82 y.o.   MRN: [id number]  Chief Complaint  Patient presents with   Acute Visit    SOB Chest congestion    Referring provider: Hamrick, Lorin Mercy, MD  HPI: 82 year old male, active cigar smoker followed for chronic pleural effusion, burnt out sarcoid, and COPD/asthma. He is a patient of Dr. Kavin Leech and last seen in office 08/17/2022. Past medical history significant for CAD, HTN, carotid stenosis, metastatic cecal cancer, IDA.  TEST/EVENTS:  03/10/2020 PFT: FVC 68, FEV1 62, ratio 68, TLC 69, DLCO 84. Mixed obstructive and restrictive disease with normal diffusing capacity. No BD. 08/21/2022 CT chest/abd/pelvis: calcified granulomatous mediastinal and bilateral hilar nodes. Stable upper lobe predominance diffuse reticular and groundglass densities. Occluded RUL bronchus. stable small to moderate right pleural effusion. Subcm cystic lesion left hepatic lobe, decreased. New subcm hypodensity in left hepatic lobe. Decreased conspicuity of enhancing portion along deep margin of the right hepatic lobe treatment cavity.   08/17/2022: OV with Dr. Silas Flood. He had sent a MyChart message the week prior with complaints of chest congestion and concern for asthma flare. Felt different from normal fluid buildup. He was sent in prednisone burst and z pack. He had marked improvement but after completing, he is wheezing again on exam and has congestion still. Extended a few additional days. Sarcoid burnt out on imaging; no evidence of active disease on recent imaging. Suspect pleural effusion related to local inflammation due to metastasis to liver. Cytology has been negative. Serial thoracentesis as needed.   08/28/2022: Today - acute Patient presents today for acute visit. He was doing better on steroids and z pack but once he completed these, his chest congestion returned and he's been more short winded. He can't walk around his house without getting out  of breath. He feels like his chest is a little tighter. Cough is dry. He denies fevers, chills, hemoptysis, leg swelling, orthopnea, calf pain, pleuritic pain. He has also been wheezing more. He uses his albuterol, which helps some. He is not on a maintenance inhaler. He doesn't feel like his symptoms are related to his pleural effusion; usually feels different than this. He is eating and drinking normally. He had a CT chest a week ago with small to moderate pleural effusion, which was stable, and no acute process. There was no evidence of PE on contrasted scan; although, this was not a CTA.   No Known Allergies  Immunization History  Administered Date(s) Administered   DTaP / IPV 09/25/2012   Fluad Quad(high Dose 65+) 02/19/2019, 03/14/2022   Influenza Split 03/27/2014   Influenza, High Dose Seasonal PF 03/27/2014, 01/27/2017, 02/22/2018, 03/12/2021   Influenza-Unspecified 02/24/2017, 03/10/2020   PFIZER Comirnaty(Gray Top)Covid-19 Tri-Sucrose Vaccine 06/27/2019, 07/21/2019   PFIZER(Purple Top)SARS-COV-2 Vaccination 07/11/2019, 07/21/2019, 01/26/2020, 10/26/2020   Pneumococcal Conjugate-13 03/27/2014   Pneumococcal Polysaccharide-23 11/10/2009   Pneumococcal-Unspecified 03/12/2014   Tdap 03/24/2011, 09/25/2012, 05/28/2021   Zoster Recombinat (Shingrix) 09/01/2018, 02/19/2019   Zoster, Live 03/21/2010    Past Medical History:  Diagnosis Date   Adenocarcinoma of cecum ([place]) 06/21/2017   Arthritis    "mid back; hands; knees" (02/24/2015)   Asthmatic bronchitis with acute exacerbation 06/27/2021   Basal cell carcinoma    left shoulder; mid chest; right eyelid (02/24/2015)   CAD (coronary artery disease)    a. 08/2014 Cath/PCI: LM nl, LAD 30p, D1 95 (2.25x12 Resolute Integrity DES), LCX small, RI 100 (attempted PCI) - branches fill via L->L  collats, RCA dominant, nl, RPDA/PLA nl, EF 60^. b. 02/24/2015 PCI CTO of Ramus DES x2.   Chronic bronchitis ([place])    hx   Diverticulosis 05/2005    Elevated lipase    Fuchs' corneal dystrophy    History of adenomatous polyp of colon 05/2005   8 mm adenoma   History of blood transfusion    "related to some of my surgeries"   Hyperlipidemia    Hypertension    Iron deficiency anemia due to chronic blood loss 06/21/2017   Pneumonia    Prostate cancer ([place]) 2011   S/P seed implant   Sarcoidosis    Thrombocytopenia ([place])    a. Noted on prior labs, unclear of what w/u done.   Trifascicular block     Tobacco History: Social History   Tobacco Use  Smoking Status Light Smoker   Packs/day: 0.00   Years: 59.00   Additional pack years: 0.00   Total pack years: 0.00   Types: Cigars, Cigarettes   Last attempt to quit: 02/24/2015   Years since quitting: 7.5  Smokeless Tobacco Never  Tobacco Comments   02/24/2015 "quit cigarettes ~ 2000 ago but smokes cigars about once every two months. ALS 03/14/2022   Ready to quit: Not Answered Counseling given: Not Answered Tobacco comments: 02/24/2015 "quit cigarettes ~ 2000 ago but smokes cigars about once every two months. ALS 03/14/2022   Outpatient Medications Prior to Visit  Medication Sig Dispense Refill   acetaminophen (TYLENOL) 500 MG tablet Take 500-1,000 mg by mouth every 6 (six) hours as needed for moderate pain.     albuterol (VENTOLIN HFA) 108 (90 Base) MCG/ACT inhaler INHALE 1-2 PUFFS BY MOUTH EVERY 6 HOURS AS NEEDED FOR WHEEZE OR SHORTNESS OF BREATH 18 each 3   cetirizine (ZYRTEC) 10 MG tablet TAKE 1 TABLET BY MOUTH EVERY DAY (Patient taking differently: Take 10 mg by mouth as needed for allergies.) 90 tablet 1   Cholecalciferol (VITAMIN D3) 2000 units capsule Take 2,000 Units by mouth daily.     clopidogrel (PLAVIX) 75 MG tablet TAKE 1 TABLET BY MOUTH EVERY DAY 90 tablet 3   ezetimibe (ZETIA) 10 MG tablet Take 1 tablet (10 mg total) by mouth daily. 90 tablet 3   fluticasone (FLONASE) 50 MCG/ACT nasal spray SPRAY 2 SPRAYS INTO EACH NOSTRIL EVERY DAY 48 mL 3   furosemide (LASIX) 20  MG tablet Take 1 tablet (20 mg total) by mouth daily. (Patient taking differently: Take 40 mg by mouth daily.) 30 tablet 2   gabapentin (NEURONTIN) 300 MG capsule Take 300 mg by mouth as needed (neuropathy).     ibuprofen (ADVIL) 200 MG tablet Take 200-400 mg by mouth every 6 (six) hours as needed for moderate pain.     metaxalone (SKELAXIN) 800 MG tablet Take 800 mg by mouth as needed for muscle spasms.     metoprolol succinate (TOPROL XL) 50 MG 24 hr tablet Take 1 tablet (50 mg total) by mouth every evening. Take with or immediately following a meal. 90 tablet 2   metroNIDAZOLE (METROCREAM) 0.75 % cream Apply 1 Application topically daily as needed (rash).     Multiple Vitamins-Minerals (VITRUM SENIOR) TABS Take 1 tablet by mouth daily.     naproxen sodium (ALEVE) 220 MG tablet Take 440 mg by mouth 2 (two) times daily as needed (pain).     nitroGLYCERIN (NITROSTAT) 0.4 MG SL tablet PLACE 1 TABLET UNDER THE TONGUE EVERY 5 MINUTES AS NEEDED FOR CHEST PAIN FOR 3 DOSES  25 tablet 3   rosuvastatin (CRESTOR) 20 MG tablet Take 1 tablet (20 mg total) by mouth daily. 90 tablet 3   azithromycin (ZITHROMAX) 250 MG tablet Take 1 tablet (250 mg total) by mouth daily. (Patient not taking: Reported on 08/28/2022) 6 tablet 0   traMADol (ULTRAM) 50 MG tablet Take 2 tablets (100 mg total) by mouth every 6 (six) hours as needed. (Patient not taking: Reported on 08/28/2022) 10 tablet 0   No facility-administered medications prior to visit.     Review of Systems:   Constitutional: No weight loss or gain, night sweats, fevers, chills, fatigue, or lassitude. HEENT: No headaches, difficulty swallowing, tooth/dental problems, or sore throat. No sneezing, itching, ear ache, nasal congestion, or post nasal drip CV:  No chest pain, orthopnea, PND, swelling in lower extremities, anasarca, dizziness, palpitations, syncope Resp: +shortness of breath with exertion; dry cough; chest congestion; wheezing. No hemoptysis. No chest  wall deformity GI:  No heartburn, indigestion, abdominal pain, nausea, vomiting, diarrhea, change in bowel habits, loss of appetite, bloody stools.  GU: No dysuria, change in color of urine, urgency or frequency.   Skin: No rash, lesions, ulcerations MSK:  No joint pain or swelling.   Neuro: No dizziness or lightheadedness.  Psych: No depression or anxiety. Mood stable.     Physical Exam:  BP 122/80 (BP Location: Left Arm, Patient Position: Sitting, Cuff Size: Normal)   Pulse 72   Temp 98.3 F (36.8 C) (Oral)   Ht 5' 10.5" (1.791 m)   Wt 235 lb (106.6 kg)   SpO2 97%   BMI 33.24 kg/m   GEN: Pleasant, interactive, well-kempt; obese; in no acute distress HEENT:  Normocephalic and atraumatic. PERRLA. Sclera white. Nasal turbinates pink, moist and patent bilaterally. No rhinorrhea present. Oropharynx pink and moist, without exudate or edema. No lesions, ulcerations, or postnasal drip.  NECK:  Supple w/ fair ROM. No JVD present. Normal carotid impulses w/o bruits. Thyroid symmetrical with no goiter or nodules palpated. No lymphadenopathy.   CV: RRR, no m/r/g, no peripheral edema. Pulses intact, +2 bilaterally. No cyanosis, pallor or clubbing. PULMONARY:  Dyspneic upon arrival to exam room. Improved with rest and post neb treatment. Diminished LLL. Expiratory wheeze bilaterally A&P. No accessory muscle use.  GI: BS present and normoactive. Soft, non-tender to palpation. No organomegaly or masses detected. MSK: No erythema, warmth or tenderness. Cap refil <2 sec all extrem. No deformities or joint swelling noted.  Neuro: A/Ox3. No focal deficits noted.   Skin: Warm, no lesions or rashe Psych: Normal affect and behavior. Judgement and thought content appropriate.     Lab Results:  CBC    Component Value Date/Time   WBC 4.5 08/01/2021 0857   WBC 4.7 05/29/2021 0359   RBC 4.83 08/01/2021 0857   RBC 4.05 (L) 05/29/2021 0359   HGB 11.6 (L) 04/25/2022 0802   HGB 11.9 (L) 04/25/2022  0802   HGB 12.7 (L) 08/01/2021 0857   HCT 34.0 (L) 04/25/2022 0802   HCT 35.0 (L) 04/25/2022 0802   HCT 40.6 08/01/2021 0857   PLT 171 08/01/2021 0857   MCV 84 08/01/2021 0857   MCH 26.3 (L) 08/01/2021 0857   MCH 25.2 (L) 05/29/2021 0359   MCHC 31.3 (L) 08/01/2021 0857   MCHC 30.8 05/29/2021 0359   RDW 20.1 (H) 08/01/2021 0857   LYMPHSABS 0.4 (L) 05/29/2021 0054   MONOABS 1.0 05/29/2021 0054   EOSABS 0.1 05/29/2021 0054   BASOSABS 0.0 05/29/2021 0054    BMET  Component Value Date/Time   NA 137 04/25/2022 0802   NA 137 04/25/2022 0802   NA 141 09/09/2021 1719   K 3.8 04/25/2022 0802   K 3.8 04/25/2022 0802   CL 99 09/09/2021 1719   CO2 24 09/09/2021 1719   GLUCOSE 100 (H) 09/09/2021 1719   GLUCOSE 112 (H) 05/29/2021 0359   BUN 15 09/09/2021 1719   CREATININE 0.80 09/09/2021 1719   CREATININE 0.83 06/07/2020 1119   CREATININE 1.02 05/16/2016 0937   CALCIUM 9.5 09/09/2021 1719   GFRNONAA >60 05/29/2021 0359   GFRNONAA >60 06/07/2020 1119   GFRAA >60 03/10/2020 0855    BNP    Component Value Date/Time   BNP 79.4 01/22/2021 1634   BNP 43.4 05/16/2016 0937     Imaging:  DG Chest 2 View  Result Date: 08/28/2022 CLINICAL DATA:  Shortness of breath. EXAM: CHEST - 2 VIEW COMPARISON:  07/14/2022 FINDINGS: The right IJ power port is stable. The cardiac silhouette, mediastinal and hilar contours are within normal limits and stable. Stable calcified mediastinal and hilar lymph nodes. Moderate-sized right pleural effusion with overlying atelectasis or infiltrate. Chronic underlying emphysematous lung changes and pulmonary scarring. IMPRESSION: Moderate-sized right pleural effusion with overlying atelectasis or infiltrate. Electronically Signed   By: Marijo Sanes M.D.   On: 08/28/2022 14:11    ipratropium-albuterol (DUONEB) 0.5-2.5 (3) MG/3ML nebulizer solution 3 mL     Date Action Dose Route User   08/28/2022 1504 Given 3 mL Nebulization June Leap, CMA           Latest Ref Rng & Units 03/10/2020    3:36 PM 08/30/2017    3:59 PM  PFT Results  FVC-Pre L 2.94  P 3.14   FVC-Predicted Pre % 68  P 72   FVC-Post L 2.90  P 3.18   FVC-Predicted Post % 67  P 73   Pre FEV1/FVC % % 66  P 70   Post FEV1/FCV % % 68  P 73   FEV1-Pre L 1.93  P 2.19   FEV1-Predicted Pre % 62  P 69   FEV1-Post L 1.96  P 2.31   DLCO uncorrected ml/min/mmHg 19.10  P 17.73   DLCO UNC% % 75  P 52   DLCO corrected ml/min/mmHg 21.40  P 20.68   DLCO COR %Predicted % 84  P 61   DLVA Predicted % 118  P 98   TLC L 5.02  P 4.99   TLC % Predicted % 69  P 68   RV % Predicted % 76  P 71     P Preliminary result    No results found for: "NITRICOXIDE"      Assessment & Plan:   Acute exacerbation of COPD with asthma (HCC) Recurrent COPD/asthma exacerbation. He responded very well to duoneb in office today so lower suspicion his symptoms are related to his effusion, which is stable from last week. He is not currently on a maintenance inhaler so I suspect this is contributing to his recurrent flares once off steroids. We will treat him with steroid burst and start him on Breztri. Provided with samples today and new rx sent. Teachback completed. Side effect profile reviewed. Hold off on further antimicrobial therapies as CXR did not reveal any superimposed infection and he has already completed extended course of azithromycin. Action plan in place and close follow up.   Patient Instructions  Continue Albuterol inhaler 2 puffs or 3 mL neb every 6 hours as needed for shortness  of breath or wheezing. Notify if symptoms persist despite rescue inhaler/neb use. Use nebs 2 times a day until symptoms improve Continue cetirizine 1 tab daily for allergies Continue flonase nasal spray 2 sprays each nostril daily  Prednisone taper. 4 tabs for 3 days, then 3 tabs for 3 days, 2 tabs for 3 days, then 1 tab for 3 days, then stop. Take in AM with food Start Breztri 2 puffs Twice daily. Brush tongue and  rinse mouth afterwards. This is your new maintenance inhaler so you will use it daily, regardless of your symptoms Guaifenesin (mucinex) [phone] mg Twice daily for chest congestion/cough  If your symptoms do not improve, please call me and I will set you up with a thoracentesis to pull the fluid off around your lung.  Follow up in one week with Dr. Silas Flood or Katie Bryelle Spiewak,NP. If symptoms do not improve or worsen, please contact office for sooner follow up or seek emergency care.    Pleural effusion See above. If no improvement, will set him up with repeat thoracentesis.   Sarcoidosis Burnt out. No active disease on imaging.    I spent 42 minutes of dedicated to the care of this patient on the date of this encounter to include pre-visit review of records, face-to-face time with the patient discussing conditions above, post visit ordering of testing, clinical documentation with the electronic health record, making appropriate referrals as documented, and communicating necessary findings to members of the patients care team.  Clayton Bibles, NP 08/28/2022  Pt aware and understands NP's role.

## 2022-08-28 NOTE — Patient Instructions (Addendum)
Continue Albuterol inhaler 2 puffs or 3 mL neb every 6 hours as needed for shortness of breath or wheezing. Notify if symptoms persist despite rescue inhaler/neb use. Use nebs 2 times a day until symptoms improve Continue cetirizine 1 tab daily for allergies Continue flonase nasal spray 2 sprays each nostril daily  Prednisone taper. 4 tabs for 3 days, then 3 tabs for 3 days, 2 tabs for 3 days, then 1 tab for 3 days, then stop. Take in AM with food Start Breztri 2 puffs Twice daily. Brush tongue and rinse mouth afterwards. This is your new maintenance inhaler so you will use it daily, regardless of your symptoms Guaifenesin (mucinex) [phone] mg Twice daily for chest congestion/cough  If your symptoms do not improve, please call me and I will set you up with a thoracentesis to pull the fluid off around your lung.  Follow up in one week with Dr. Silas Flood or Katie [place] Dohmen,NP. If symptoms do not improve or worsen, please contact office for sooner follow up or seek emergency care.

## 2022-08-28 NOTE — Assessment & Plan Note (Signed)
Recurrent COPD/asthma exacerbation. He responded very well to duoneb in office today so lower suspicion his symptoms are related to his effusion, which is stable from last week. He is not currently on a maintenance inhaler so I suspect this is contributing to his recurrent flares once off steroids. We will treat him with steroid burst and start him on Breztri. Provided with samples today and new rx sent. Teachback completed. Side effect profile reviewed. Hold off on further antimicrobial therapies as CXR did not reveal any superimposed infection and he has already completed extended course of azithromycin. Action plan in place and close follow up.   Patient Instructions  Continue Albuterol inhaler 2 puffs or 3 mL neb every 6 hours as needed for shortness of breath or wheezing. Notify if symptoms persist despite rescue inhaler/neb use. Use nebs 2 times a day until symptoms improve Continue cetirizine 1 tab daily for allergies Continue flonase nasal spray 2 sprays each nostril daily  Prednisone taper. 4 tabs for 3 days, then 3 tabs for 3 days, 2 tabs for 3 days, then 1 tab for 3 days, then stop. Take in AM with food Start Breztri 2 puffs Twice daily. Brush tongue and rinse mouth afterwards. This is your new maintenance inhaler so you will use it daily, regardless of your symptoms Guaifenesin (mucinex) [phone] mg Twice daily for chest congestion/cough  If your symptoms do not improve, please call me and I will set you up with a thoracentesis to pull the fluid off around your lung.  Follow up in one week with Dr. Silas Flood or Katie Lourdes Kucharski,NP. If symptoms do not improve or worsen, please contact office for sooner follow up or seek emergency care.

## 2022-08-30 DIAGNOSIS — J449 Chronic obstructive pulmonary disease, unspecified: Secondary | ICD-10-CM | POA: Diagnosis not present

## 2022-09-04 ENCOUNTER — Encounter: Payer: Self-pay | Admitting: Nurse Practitioner

## 2022-09-04 ENCOUNTER — Ambulatory Visit: Payer: Medicare PPO | Admitting: Nurse Practitioner

## 2022-09-04 VITALS — BP 126/66 | HR 69 | Temp 98.0°F | Ht 70.0 in | Wt 234.6 lb

## 2022-09-04 DIAGNOSIS — J441 Chronic obstructive pulmonary disease with (acute) exacerbation: Secondary | ICD-10-CM | POA: Diagnosis not present

## 2022-09-04 DIAGNOSIS — J90 Pleural effusion, not elsewhere classified: Secondary | ICD-10-CM

## 2022-09-04 DIAGNOSIS — J45901 Unspecified asthma with (acute) exacerbation: Secondary | ICD-10-CM

## 2022-09-04 NOTE — Patient Instructions (Addendum)
-  Continue Albuterol inhaler 2 puffs or 3 mL neb every 6 hours as needed for shortness of breath or wheezing. Notify if symptoms persist despite rescue inhaler/neb use. Use nebs 2 times a day until symptoms improve -Continue Breztri 2 puffs Twice daily. Brush tongue and rinse mouth afterwards. This is your new maintenance inhaler so you will use it daily, regardless of your symptoms -Continue cetirizine 1 tab daily for allergies -Continue flonase nasal spray 2 sprays each nostril daily -Complete Prednisone taper as previously directed. Take in AM with food Start  -Continue Guaifenesin (mucinex) [phone] mg Twice daily for chest congestion/cough  Follow up in six weeks with Dr. Silas Flood or Katie Debbora Ang,NP. If symptoms do not improve or worsen, please contact office for sooner follow up or seek emergency care.

## 2022-09-04 NOTE — Progress Notes (Signed)
@Patient  ID: Luis [name], male    DOB: 10-21-1940, 82 y.o.   MRN: [id number]  No chief complaint on file.   Referring provider: [name], MD  HPI: 82 year old male, active cigar smoker followed for chronic pleural effusion, burnt out sarcoid, and COPD/asthma. He is a patient of Dr. Kavin Leech and last seen in office 08/28/2022 by [facility] NP. Past medical history significant for CAD, HTN, carotid stenosis, metastatic cecal cancer, IDA.  TEST/EVENTS:  03/10/2020 PFT: FVC 68, FEV1 62, ratio 68, TLC 69, DLCO 84. Mixed obstructive and restrictive disease with normal diffusing capacity. No BD. 08/21/2022 CT chest/abd/pelvis: calcified granulomatous mediastinal and bilateral hilar nodes. Stable upper lobe predominance diffuse reticular and groundglass densities. Occluded RUL bronchus. stable small to moderate right pleural effusion. Subcm cystic lesion left hepatic lobe, decreased. New subcm hypodensity in left hepatic lobe. Decreased conspicuity of enhancing portion along deep margin of the right hepatic lobe treatment cavity.   08/17/2022: OV with Dr. Silas Flood. He had sent a MyChart message the week prior with complaints of chest congestion and concern for asthma flare. Felt different from normal fluid buildup. He was sent in prednisone burst and z pack. He had marked improvement but after completing, he is wheezing again on exam and has congestion still. Extended a few additional days. Sarcoid burnt out on imaging; no evidence of active disease on recent imaging. Suspect pleural effusion related to local inflammation due to metastasis to liver. Cytology has been negative. Serial thoracentesis as needed.   08/28/2022: OV with Shealeigh Dunstan NP for acute visit. He was doing better on steroids and z pack but once he completed these, his chest congestion returned and he's been more short winded. He can't walk around his house without getting out of breath. He feels like his chest is a little tighter. Cough is  dry. He denies fevers, chills, hemoptysis, leg swelling, orthopnea, calf pain, pleuritic pain. He has also been wheezing more. He uses his albuterol, which helps some. He is not on a maintenance inhaler. He doesn't feel like his symptoms are related to his pleural effusion; usually feels different than this. He is eating and drinking normally. He had a CT chest a week ago with small to moderate pleural effusion, which was stable, and no acute process. There was no evidence of PE on contrasted scan; although, this was not a CTA.   09/04/2022: Today - follow up Patient presents today for follow up after being treated for AECOPD/asthma. He is feeling much better today. Chest feels clear for the most part. Still having some congestion and cough in the morning. His breathing is closer to his baseline. He was able to walk back to the exam room today without any difficulties. Denies any fevers, chills, hemoptysis, leg swelling, orthopnea. He does feel the Breztri helps. Still doing his breathing treatments twice a day.   No Known Allergies  Immunization History  Administered Date(s) Administered   DTaP / IPV 09/25/2012   Fluad Quad(high Dose 65+) 02/19/2019, 03/14/2022   Influenza Split 03/27/2014   Influenza, High Dose Seasonal PF 03/27/2014, 01/27/2017, 02/22/2018, 03/12/2021   Influenza-Unspecified 02/24/2017, 03/10/2020   PFIZER Comirnaty(Gray Top)Covid-19 Tri-Sucrose Vaccine 06/27/2019, 07/21/2019   PFIZER(Purple Top)SARS-COV-2 Vaccination 07/11/2019, 07/21/2019, 01/26/2020, 10/26/2020   Pneumococcal Conjugate-13 03/27/2014   Pneumococcal Polysaccharide-23 11/10/2009   Pneumococcal-Unspecified 03/12/2014   Tdap 03/24/2011, 09/25/2012, 05/28/2021   Zoster Recombinat (Shingrix) 09/01/2018, 02/19/2019   Zoster, Live 03/21/2010    Past Medical History:  Diagnosis Date  Adenocarcinoma of cecum ([place]) 06/21/2017   Arthritis    "mid back; hands; knees" (02/24/2015)   Asthmatic bronchitis with acute  exacerbation 06/27/2021   Basal cell carcinoma    left shoulder; mid chest; right eyelid (02/24/2015)   CAD (coronary artery disease)    a. 08/2014 Cath/PCI: LM nl, LAD 30p, D1 95 (2.25x12 Resolute Integrity DES), LCX small, RI 100 (attempted PCI) - branches fill via L->L collats, RCA dominant, nl, RPDA/PLA nl, EF 60^. b. 02/24/2015 PCI CTO of Ramus DES x2.   Chronic bronchitis ([place])    hx   Diverticulosis 05/2005   Elevated lipase    Fuchs' corneal dystrophy    History of adenomatous polyp of colon 05/2005   8 mm adenoma   History of blood transfusion    "related to some of my surgeries"   Hyperlipidemia    Hypertension    Iron deficiency anemia due to chronic blood loss 06/21/2017   Pneumonia    Prostate cancer ([place]) 2011   S/P seed implant   Sarcoidosis    Thrombocytopenia ([place])    a. Noted on prior labs, unclear of what w/u done.   Trifascicular block     Tobacco History: Social History   Tobacco Use  Smoking Status Light Smoker   Packs/day: 0.00   Years: 59.00   Additional pack years: 0.00   Total pack years: 0.00   Types: Cigars, Cigarettes   Last attempt to quit: 02/24/2015   Years since quitting: 7.5  Smokeless Tobacco Never  Tobacco Comments   02/24/2015 "quit cigarettes ~ 2000 ago but smokes cigars about once every two months. ALS 03/14/2022   Ready to quit: Not Answered Counseling given: Not Answered Tobacco comments: 02/24/2015 "quit cigarettes ~ 2000 ago but smokes cigars about once every two months. ALS 03/14/2022   Outpatient Medications Prior to Visit  Medication Sig Dispense Refill   acetaminophen (TYLENOL) 500 MG tablet Take 500-1,000 mg by mouth every 6 (six) hours as needed for moderate pain.     albuterol (PROVENTIL) (2.5 MG/3ML) 0.083% nebulizer solution Take 3 mLs (2.5 mg total) by nebulization every 6 (six) hours as needed for wheezing or shortness of breath. 75 mL 5   albuterol (VENTOLIN HFA) 108 (90 Base) MCG/ACT inhaler INHALE 1-2 PUFFS BY MOUTH  EVERY 6 HOURS AS NEEDED FOR WHEEZE OR SHORTNESS OF BREATH 18 each 3   Budeson-Glycopyrrol-Formoterol (BREZTRI AEROSPHERE) 160-9-4.8 MCG/ACT AERO Inhale 2 puffs into the lungs in the morning and at bedtime. 1 each 5   cetirizine (ZYRTEC) 10 MG tablet TAKE 1 TABLET BY MOUTH EVERY DAY (Patient taking differently: Take 10 mg by mouth as needed for allergies.) 90 tablet 1   Cholecalciferol (VITAMIN D3) 2000 units capsule Take 2,000 Units by mouth daily.     clopidogrel (PLAVIX) 75 MG tablet TAKE 1 TABLET BY MOUTH EVERY DAY 90 tablet 3   ezetimibe (ZETIA) 10 MG tablet Take 1 tablet (10 mg total) by mouth daily. 90 tablet 3   fluticasone (FLONASE) 50 MCG/ACT nasal spray SPRAY 2 SPRAYS INTO EACH NOSTRIL EVERY DAY 48 mL 3   furosemide (LASIX) 20 MG tablet Take 1 tablet (20 mg total) by mouth daily. (Patient taking differently: Take 40 mg by mouth daily.) 30 tablet 2   gabapentin (NEURONTIN) 300 MG capsule Take 300 mg by mouth as needed (neuropathy).     ibuprofen (ADVIL) 200 MG tablet Take 200-400 mg by mouth every 6 (six) hours as needed for moderate pain.  metaxalone (SKELAXIN) 800 MG tablet Take 800 mg by mouth as needed for muscle spasms.     metoprolol succinate (TOPROL XL) 50 MG 24 hr tablet Take 1 tablet (50 mg total) by mouth every evening. Take with or immediately following a meal. 90 tablet 2   metroNIDAZOLE (METROCREAM) 0.75 % cream Apply 1 Application topically daily as needed (rash).     Multiple Vitamins-Minerals (VITRUM SENIOR) TABS Take 1 tablet by mouth daily.     naproxen sodium (ALEVE) 220 MG tablet Take 440 mg by mouth 2 (two) times daily as needed (pain).     nitroGLYCERIN (NITROSTAT) 0.4 MG SL tablet PLACE 1 TABLET UNDER THE TONGUE EVERY 5 MINUTES AS NEEDED FOR CHEST PAIN FOR 3 DOSES 25 tablet 3   predniSONE (DELTASONE) 10 MG tablet 4 tabs for 3 days, then 3 tabs for 3 days, 2 tabs for 3 days, then 1 tab for 3 days, then stop 30 tablet 0   rosuvastatin (CRESTOR) 20 MG tablet Take 1  tablet (20 mg total) by mouth daily. 90 tablet 3   azithromycin (ZITHROMAX) 250 MG tablet Take 1 tablet (250 mg total) by mouth daily. (Patient not taking: Reported on 08/28/2022) 6 tablet 0   traMADol (ULTRAM) 50 MG tablet Take 2 tablets (100 mg total) by mouth every 6 (six) hours as needed. (Patient not taking: Reported on 08/28/2022) 10 tablet 0   No facility-administered medications prior to visit.     Review of Systems:   Constitutional: No weight loss or gain, night sweats, fevers, chills, fatigue, or lassitude. HEENT: No headaches, difficulty swallowing, tooth/dental problems, or sore throat. No sneezing, itching, ear ache, nasal congestion, or post nasal drip CV:  No chest pain, orthopnea, PND, swelling in lower extremities, anasarca, dizziness, palpitations, syncope Resp: +shortness of breath with exertion (improved); resolving cough and chest congestion. No wheezing. No hemoptysis. No chest wall deformity GI:  No heartburn, indigestion, abdominal pain, nausea, vomiting, diarrhea, change in bowel habits, loss of appetite, bloody stools.  GU: No dysuria, change in color of urine, urgency or frequency.   Skin: No rash, lesions, ulcerations MSK:  No joint pain or swelling.   Neuro: No dizziness or lightheadedness.  Psych: No depression or anxiety. Mood stable.     Physical Exam:  BP 126/66 (BP Location: Right Arm, Patient Position: Sitting, Cuff Size: Normal)   Pulse 69   Temp 98 F (36.7 C) (Oral)   Ht 5\' 10"  (1.778 m)   Wt 234 lb 9.6 oz (106.4 kg)   SpO2 94%   BMI 33.66 kg/m   GEN: Pleasant, interactive, well-kempt; obese; in no acute distress HEENT:  Normocephalic and atraumatic. PERRLA. Sclera white. Nasal turbinates pink, moist and patent bilaterally. No rhinorrhea present. Oropharynx pink and moist, without exudate or edema. No lesions, ulcerations, or postnasal drip.  NECK:  Supple w/ fair ROM. No JVD present. Normal carotid impulses w/o bruits. Thyroid symmetrical  with no goiter or nodules palpated. No lymphadenopathy.   CV: RRR, no m/r/g, no peripheral edema. Pulses intact, +2 bilaterally. No cyanosis, pallor or clubbing. PULMONARY:  Unlabored, regular breathing. Clear bilaterally A&P w/o wheezes/rales/rhonchi. No accessory muscle use.  GI: BS present and normoactive. Soft, non-tender to palpation. No organomegaly or masses detected. MSK: No erythema, warmth or tenderness. Cap refil <2 sec all extrem. No deformities or joint swelling noted.  Neuro: A/Ox3. No focal deficits noted.   Skin: Warm, no lesions or rashe Psych: Normal affect and behavior. Judgement and thought content appropriate.  Lab Results:  CBC    Component Value Date/Time   WBC 4.5 08/01/2021 0857   WBC 4.7 05/29/2021 0359   RBC 4.83 08/01/2021 0857   RBC 4.05 (L) 05/29/2021 0359   HGB 11.6 (L) 04/25/2022 0802   HGB 11.9 (L) 04/25/2022 0802   HGB 12.7 (L) 08/01/2021 0857   HCT 34.0 (L) 04/25/2022 0802   HCT 35.0 (L) 04/25/2022 0802   HCT 40.6 08/01/2021 0857   PLT 171 08/01/2021 0857   MCV 84 08/01/2021 0857   MCH 26.3 (L) 08/01/2021 0857   MCH 25.2 (L) 05/29/2021 0359   MCHC 31.3 (L) 08/01/2021 0857   MCHC 30.8 05/29/2021 0359   RDW 20.1 (H) 08/01/2021 0857   LYMPHSABS 0.4 (L) 05/29/2021 0054   MONOABS 1.0 05/29/2021 0054   EOSABS 0.1 05/29/2021 0054   BASOSABS 0.0 05/29/2021 0054    BMET    Component Value Date/Time   NA 137 04/25/2022 0802   NA 137 04/25/2022 0802   NA 141 09/09/2021 1719   K 3.8 04/25/2022 0802   K 3.8 04/25/2022 0802   CL 99 09/09/2021 1719   CO2 24 09/09/2021 1719   GLUCOSE 100 (H) 09/09/2021 1719   GLUCOSE 112 (H) 05/29/2021 0359   BUN 15 09/09/2021 1719   CREATININE 0.80 09/09/2021 1719   CREATININE 0.83 06/07/2020 1119   CREATININE 1.02 05/16/2016 0937   CALCIUM 9.5 09/09/2021 1719   GFRNONAA >60 05/29/2021 0359   GFRNONAA >60 06/07/2020 1119   GFRAA >60 03/10/2020 0855    BNP    Component Value Date/Time   BNP 79.4  01/22/2021 1634   BNP 43.4 05/16/2016 0937     Imaging:  DG Chest 2 View  Result Date: 08/28/2022 CLINICAL DATA:  Shortness of breath. EXAM: CHEST - 2 VIEW COMPARISON:  07/14/2022 FINDINGS: The right IJ power port is stable. The cardiac silhouette, mediastinal and hilar contours are within normal limits and stable. Stable calcified mediastinal and hilar lymph nodes. Moderate-sized right pleural effusion with overlying atelectasis or infiltrate. Chronic underlying emphysematous lung changes and pulmonary scarring. IMPRESSION: Moderate-sized right pleural effusion with overlying atelectasis or infiltrate. Electronically Signed   By: Marijo Sanes M.D.   On: 08/28/2022 14:11    ipratropium-albuterol (DUONEB) 0.5-2.5 (3) MG/3ML nebulizer solution 3 mL     Date Action Dose Route User   08/28/2022 1504 Given 3 mL Nebulization June Leap, CMA          Latest Ref Rng & Units 03/10/2020    3:36 PM 08/30/2017    3:59 PM  PFT Results  FVC-Pre L 2.94  P 3.14   FVC-Predicted Pre % 68  P 72   FVC-Post L 2.90  P 3.18   FVC-Predicted Post % 67  P 73   Pre FEV1/FVC % % 66  P 70   Post FEV1/FCV % % 68  P 73   FEV1-Pre L 1.93  P 2.19   FEV1-Predicted Pre % 62  P 69   FEV1-Post L 1.96  P 2.31   DLCO uncorrected ml/min/mmHg 19.10  P 17.73   DLCO UNC% % 75  P 52   DLCO corrected ml/min/mmHg 21.40  P 20.68   DLCO COR %Predicted % 84  P 61   DLVA Predicted % 118  P 98   TLC L 5.02  P 4.99   TLC % Predicted % 69  P 68   RV % Predicted % 76  P 71     P  Preliminary result    No results found for: "NITRICOXIDE"      Assessment & Plan:   Acute exacerbation of COPD with asthma ([place]) Resolving AECOPD/asthma. He is clinically improved today. He has benefit from use of Breztri; we will continue him on this for maintenance therapy. He will continue mucociliary clearance therapies until resolved. Action plan in place.  Patient Instructions  -Continue Albuterol inhaler 2 puffs or 3 mL neb  every 6 hours as needed for shortness of breath or wheezing. Notify if symptoms persist despite rescue inhaler/neb use. Use nebs 2 times a day until symptoms improve -Continue Breztri 2 puffs Twice daily. Brush tongue and rinse mouth afterwards. This is your new maintenance inhaler so you will use it daily, regardless of your symptoms -Continue cetirizine 1 tab daily for allergies -Continue flonase nasal spray 2 sprays each nostril daily -Complete Prednisone taper as previously directed. Take in AM with food Start  -Continue Guaifenesin (mucinex) [phone] mg Twice daily for chest congestion/cough  Follow up in six weeks with Dr. Silas Flood or Katie Dwan Fennel,NP. If symptoms do not improve or worsen, please contact office for sooner follow up or seek emergency care.    Pleural effusion Moderate effusion. He recently was treated for AECOPD/asthma. Does not appear that his effusion was the contributing cause to his respiratory symptoms. Will continue to monitor moving forward. He will need to be off Plavix 5 days before repeat thoracentesis, if needed in the future.     I spent 28 minutes of dedicated to the care of this patient on the date of this encounter to include pre-visit review of records, face-to-face time with the patient discussing conditions above, post visit ordering of testing, clinical documentation with the electronic health record, making appropriate referrals as documented, and communicating necessary findings to members of the patients care team.  Clayton Bibles, NP 09/04/2022  Pt aware and understands NP's role.

## 2022-09-04 NOTE — Assessment & Plan Note (Signed)
Resolving AECOPD/asthma. He is clinically improved today. He has benefit from use of Breztri; we will continue him on this for maintenance therapy. He will continue mucociliary clearance therapies until resolved. Action plan in place.  Patient Instructions  -Continue Albuterol inhaler 2 puffs or 3 mL neb every 6 hours as needed for shortness of breath or wheezing. Notify if symptoms persist despite rescue inhaler/neb use. Use nebs 2 times a day until symptoms improve -Continue Breztri 2 puffs Twice daily. Brush tongue and rinse mouth afterwards. This is your new maintenance inhaler so you will use it daily, regardless of your symptoms -Continue cetirizine 1 tab daily for allergies -Continue flonase nasal spray 2 sprays each nostril daily -Complete Prednisone taper as previously directed. Take in AM with food Start  -Continue Guaifenesin (mucinex) [phone] mg Twice daily for chest congestion/cough  Follow up in six weeks with Dr. Silas Flood or Katie Laveda Demedeiros,NP. If symptoms do not improve or worsen, please contact office for sooner follow up or seek emergency care.

## 2022-09-04 NOTE — Assessment & Plan Note (Signed)
Moderate effusion. He recently was treated for AECOPD/asthma. Does not appear that his effusion was the contributing cause to his respiratory symptoms. Will continue to monitor moving forward. He will need to be off Plavix 5 days before repeat thoracentesis, if needed in the future.

## 2022-09-12 ENCOUNTER — Inpatient Hospital Stay: Payer: Medicare PPO | Attending: Oncology | Admitting: Oncology

## 2022-09-12 VITALS — BP 141/63 | HR 66 | Temp 98.1°F | Resp 18 | Ht 70.0 in | Wt 238.6 lb

## 2022-09-12 DIAGNOSIS — D696 Thrombocytopenia, unspecified: Secondary | ICD-10-CM | POA: Insufficient documentation

## 2022-09-12 DIAGNOSIS — C787 Secondary malignant neoplasm of liver and intrahepatic bile duct: Secondary | ICD-10-CM | POA: Diagnosis not present

## 2022-09-12 DIAGNOSIS — C180 Malignant neoplasm of cecum: Secondary | ICD-10-CM | POA: Insufficient documentation

## 2022-09-12 NOTE — Progress Notes (Signed)
[place] OFFICE PROGRESS NOTE   Diagnosis: Colon cancer  INTERVAL HISTORY:   Luis [name] returns for a scheduled follow-up visit.  He is followed closely at [facility] for treatment of metastatic colon cancer.  He is currently on a treatment break and is scheduled for a restaging evaluation later this month.  He feels well.  Good appetite.  No difficulty with bowel function.  He has intermittent neuropathy symptoms, improved with gabapentin.  He bruises easily.  Objective:  Vital signs in last 24 hours:  Blood pressure (!) 141/63, pulse 66, temperature 98.1 F (36.7 C), temperature source Oral, resp. rate 18, height 5\' 10"  (1.778 m), weight 238 lb 9.6 oz (108.2 kg), SpO2 96 %.    Lymphatics: No cervical, supraclavicular, axillary, or inguinal nodes Resp: Lungs clear bilaterally Cardio: Regular rate and rhythm GI: No hepatosplenomegaly Vascular: No leg edema  Skin: No ecchymoses over the arms  Portacath/PICC-without erythema  Lab Results:  Lab Results  Component Value Date   WBC 4.5 08/01/2021   HGB 11.6 (L) 04/25/2022   HGB 11.9 (L) 04/25/2022   HCT 34.0 (L) 04/25/2022   HCT 35.0 (L) 04/25/2022   MCV 84 08/01/2021   PLT 171 08/01/2021   NEUTROABS 4.9 05/29/2021    CMP  Lab Results  Component Value Date   NA 137 04/25/2022   NA 137 04/25/2022   K 3.8 04/25/2022   K 3.8 04/25/2022   CL 99 09/09/2021   CO2 24 09/09/2021   GLUCOSE 100 (H) 09/09/2021   BUN 15 09/09/2021   CREATININE 0.80 09/09/2021   CALCIUM 9.5 09/09/2021   PROT 6.6 05/29/2021   ALBUMIN 4.0 05/29/2021   AST 50 (H) 05/29/2021   ALT 33 05/29/2021   ALKPHOS 114 05/29/2021   BILITOT 0.7 05/29/2021   GFRNONAA >60 05/29/2021   GFRAA >60 03/10/2020    Lab Results  Component Value Date   CEA1 2.10 04/14/2020   CEA 1.3 06/20/2017    Medications: I have reviewed the patient's current medications.   Assessment/Plan:  Adenocarcinoma of the cecum, stage IIA (T3N0), status post a  right colectomy 07/26/2017 0/16 lymph nodes positive, no lymphovascular invasion, perineural invasion present MSI-stable, no loss of mismatch repair protein expression Surveillance colonoscopy 07/26/2018-no polyps found Elevated CEA June 2020, persistently elevated August 2020 CTs 02/07/2019- bilateral liver metastases, no extrahepatic metastatic disease, changes of sarcoidosis in the chest Biopsy liver lesion 03/04/2019-metastatic adenocarcinoma, with morphology consistent with metastatic colonic adenocarcinoma, RAS WT Cycle 1 FOLFOX 03/12/2019 Cycle 2 FOLFOX 03/26/2019, oxaliplatin dose reduced secondary to thrombocytopenia Cycle 3 FOLFOX 04/09/2019, oxaliplatin further dose reduced and aspirin held secondary to thrombocytopenia Cycle 4 FOLFOX 04/23/2019 Cycle 5 FOLFOX 05/07/2019 CTs 05/23/2019-decrease in liver lesions.  No new or progressive findings. Cycle 6 FOLFOX 05/27/2019 Cycle 7 FOLFOX 06/14/2019 Cycle 8 FOLFOX 07/02/2019-Udenyca held Cycle 9 FOLFOX 07/16/2019-Udenyca held Cycle 10 FOLFOX 07/30/2019-Udenyca held CT abdomen/pelvis 08/13/2019-mild improvement in previously noted liver lesions MRI abdomen 08/13/2019-3 liver lesions  Cycle 1 FOLFIRI/Avastin 09/17/2019 Cycle 2 FOLFIRI/Avastin 10/01/2019 Cycle 3 FOLFIRI/Avastin 10/15/2019 Cycle 4 FOLFIRI/Avastin 11/05/2019 Cycle 5 FOLFIRI/Avastin 11/19/2019 CTs 11/27/2019-stable liver lesions; no new liver lesions.  No other evidence of metastatic disease in the abdomen or pelvis. Cycle 1 FOLFIRI/Panitumumab 12/03/2019 Cycle 2 FOLFIRI/Panitumumab 12/17/2019 (Panitumumab dose reduced due to rash) Cycle 3 FOLFIRI/Panitumumab 01/01/2020 Cycle 4 FOLFIRI/Panitumumab 01/14/2020 Cycle 5 FOLFIRI/Panitumumab 01/28/2020 CT abdomen/pelvis 02/06/2020-decreased size of right hepatic lesions, subtle lesion in the lateral left hepatic lobe-not clearly identified, no evidence of disease progression  Cycle 6 FOLFIRI/Panitumumab 02/11/2020 Cycle 7 FOLFIRI/Panitumumab  03/10/2020 Cycle 8 FOLFIRI/Panitumumab 03/26/2020 Cycle 9 FOLFIRI/Panitumumab 04/14/2020 CT abdomen/pelvis 04/26/2020-mild enlargement of 2 liver lesions on initial report, repeat review with radiology consistent with stable disease Cycle 10 FOLFIRI/Panitumumab 04/29/2020 MRI liver 05/19/2020-right hepatic lobe lesions diminished in size when compared to the study of March 2021.  Lesion in hepatic subsegment VI with very limited assessment as well due to adjacent bowel. 05/20/2020 referred to interventional radiology to consider ablation of liver lesions CT abdomen 06/30/2020 prior to planned ablation procedure, enlargement of segment 7 liver lesion and segment 6 liver lesion.  Interval recurrence of segment 2 lesion measuring 4.5 cm, ablation aborted 07/23/2020-diagnostic laparoscopy, robotic assisted insertion of hepatic infusion pump, portal lymphadenectomy-granulomatous inflammation compatible with sarcoidosis, core tumor biopsy segment 6, infiltrating adenocarcinoma 08/02/2020-cycle 1 FUDR at 50% dose 08/16/2020-FOLFIRI/panitumumab CTs 10/25/2020-significantly decreased size of hepatic metastases 10/25/2020 cycle 6 FOLFIRI/Panitumumab, FUDR # 4 11/09/2020-cycle 7 FOLFIRI/panitumumab 01/06/2021-diagnostic laparoscopy, partial hepatectomy-segment 2, segment 6, microwave ablation segment 7-3 tumors identified, segment 2 and 6 tumors resected-adenocarcinoma with negative margins, segment 2 lesion with less than 1 mm hepatic parenchymal margin 03/28/2021-CTs with slight increase in right pleural effusion, CEA 1.5 05/16/2021-increasing ill-defined lesion in segment 7, MRI-posttreatment change of the liver with heterogenous hypoenhancement adjacent to hepatic segment 7 ablation site suspicious for local recurrence January 2023-SBRT to segment 7 ablation cavity 08/08/2021-stable posttreatment changes in liver, segment 7 ablation cavity unchanged, no new metastatic disease, increasing pleural effusion 10/17/2021,  CT-interval increase in left hepatic lobe segment 3 liver lesion measuring up to 2 cm 11/17/2021-completed 60 Gray in 8 fractions to segment 3 liver lesion 01/02/2022-CTs-stable 03/17/2022-thoracentesis right pleural effusion 03/27/2022-CTs-increased size of hypoattenuating lesion at the deep margin of the previously treated lesion right hepatic lobe 04/05/2019-MRI with increased size of a heterogenous lesion at the deep margin of a previously treated right hepatic lobe lesion, small right pleural effusion 04/17/2022-5-FU and FUDR and panitumumab 05/07/2022 FUDR No. 1 at 100% plus 5-FU 07/10/2022 FUDR No. 4 at 100% 07/24/2022 glycerin flush plus 5-FU plus panitumumab 08/21/2022 CTs-slight decrease conspicuity of enhancing portion at the deep margin of the right hepatic lobe treatment cavity, new subcentimeter hypodensity in the medial left hepatic lobe, decreased subcentimeter cystic lesion in the left hepatic lobe, stable right pleural effusion  History of colon polyps Microcytic anemia January 2019 Sarcoidosis Coronary artery disease Port-A-Cath placement, interventional radiology, 03/04/2019 Thrombocytopenia, mild thrombocytopenia predating chemotherapy Oxaliplatin neuropathy Dyspnea on exertion-CT chest 02/25/2020 negative for PE, infection, pneumonitis; scheduled for PFTs 03/10/2020, followed by pulmonary       Disposition: The restaging CTs in March showed 2 indeterminate liver lesions.  He is now on a treatment break.  He is scheduled for a restaging evaluation at [facility] on 10/09/2022.  Luis [name] has metastatic colon cancer.  He is followed closely at [facility] by Dr. Reynaldo Minium and Fayrene Helper.  He is currently on a treatment break with the plan for a restaging evaluation 10/09/2022.  He will return for an office visit in 4 months.  I am available to see him sooner as needed.  He will continue flushing of the abdominal infusion pump and Port-A-Cath at [facility].  Betsy Coder, MD  09/12/2022  8:20  AM

## 2022-09-15 ENCOUNTER — Ambulatory Visit: Payer: Medicare PPO

## 2022-09-15 ENCOUNTER — Ambulatory Visit (HOSPITAL_COMMUNITY): Payer: Medicare PPO | Attending: Physician Assistant

## 2022-09-15 ENCOUNTER — Telehealth: Payer: Self-pay | Admitting: Physician Assistant

## 2022-09-15 DIAGNOSIS — I2510 Atherosclerotic heart disease of native coronary artery without angina pectoris: Secondary | ICD-10-CM

## 2022-09-15 DIAGNOSIS — E782 Mixed hyperlipidemia: Secondary | ICD-10-CM | POA: Insufficient documentation

## 2022-09-15 LAB — ECHOCARDIOGRAM COMPLETE
Area-P 1/2: 2.95 cm2
S' Lateral: 3 cm

## 2022-09-15 NOTE — Telephone Encounter (Signed)
Spoke with patient, he is coming in this morning for echocardiogram. He states he forgot to have fasting cholesterol/liver labs drawn by PCP as requested at last office visit with Jari Favre, PA-C.  Placed orders for Lipid panel and liver panel to be drawn today. Patient is fasting this morning.

## 2022-09-15 NOTE — Telephone Encounter (Signed)
Pt states that he forgot to have labs orders put in for Cholesterol/Liver by PCP as requested at 3/4 visit. He would like to know if they can be put in by our office so that he can have them done. He would like a callback regarding this matter. Please advise

## 2022-09-16 LAB — LIPID PANEL
Chol/HDL Ratio: 2.2 ratio (ref 0.0–5.0)
Cholesterol, Total: 169 mg/dL (ref 100–199)
HDL: 76 mg/dL (ref >39)
LDL Chol Calc (NIH): 78 mg/dL (ref 0–99)
Triglycerides: 81 mg/dL (ref 0–149)
VLDL Cholesterol Cal: 15 mg/dL (ref 5–40)

## 2022-09-16 LAB — HEPATIC FUNCTION PANEL
ALT: 47 IU/L — ABNORMAL HIGH (ref 0–44)
AST: 53 IU/L — ABNORMAL HIGH (ref 0–40)
Albumin: 3.8 g/dL (ref 3.7–4.7)
Alkaline Phosphatase: 334 IU/L — ABNORMAL HIGH (ref 44–121)
Bilirubin Total: 0.6 mg/dL (ref 0.0–1.2)
Bilirubin, Direct: 0.27 mg/dL (ref 0.00–0.40)
Total Protein: 5.9 g/dL — ABNORMAL LOW (ref 6.0–8.5)

## 2022-09-18 ENCOUNTER — Telehealth: Payer: Self-pay | Admitting: Interventional Cardiology

## 2022-09-18 NOTE — Telephone Encounter (Signed)
Patient is returning RN's call for lab results. Please advise.  

## 2022-09-18 NOTE — Telephone Encounter (Signed)
Patient notified.  Results routed to Dr Nathanial Rancher.  Results also sent to Dr Maryruth Hancock per patient's request.

## 2022-09-18 NOTE — Telephone Encounter (Signed)
-----   Message from Corky Crafts, MD sent at 09/18/2022 11:16 AM EDT ----- Mildly increased LFTs.  Forward to PCP for further eval.

## 2022-09-20 DIAGNOSIS — M5416 Radiculopathy, lumbar region: Secondary | ICD-10-CM | POA: Diagnosis not present

## 2022-09-30 DIAGNOSIS — J449 Chronic obstructive pulmonary disease, unspecified: Secondary | ICD-10-CM | POA: Diagnosis not present

## 2022-10-06 NOTE — Telephone Encounter (Signed)
Received the following message from patient:   "The fluid accumulation on my lung is beginning to affect my breathing again. I believe we should drain the fluid. I will stop taking the blood clopidogrel today. I have an appointment with my oncologist and a scan on Monday (4/29). I will ask them to provide a copy of the scan to you. It shows how much fluid has accumulated in the lung. If possible, could we get this done next week?"  Dr. Judeth Horn, can you please advise? Thanks!

## 2022-10-09 DIAGNOSIS — K769 Liver disease, unspecified: Secondary | ICD-10-CM | POA: Diagnosis not present

## 2022-10-09 DIAGNOSIS — C787 Secondary malignant neoplasm of liver and intrahepatic bile duct: Secondary | ICD-10-CM | POA: Diagnosis not present

## 2022-10-09 DIAGNOSIS — C19 Malignant neoplasm of rectosigmoid junction: Secondary | ICD-10-CM | POA: Diagnosis not present

## 2022-10-09 DIAGNOSIS — J90 Pleural effusion, not elsewhere classified: Secondary | ICD-10-CM | POA: Diagnosis not present

## 2022-10-09 DIAGNOSIS — C189 Malignant neoplasm of colon, unspecified: Secondary | ICD-10-CM | POA: Diagnosis not present

## 2022-10-09 DIAGNOSIS — C182 Malignant neoplasm of ascending colon: Secondary | ICD-10-CM | POA: Diagnosis not present

## 2022-10-09 NOTE — Telephone Encounter (Signed)
Please assist in scheduling thoracentesis 5/1 at [facility] endoscopy. Dr. Cheri Fowler to perform procedure. Diagnosis is pleural effusion and shortness of breath.

## 2022-10-11 ENCOUNTER — Other Ambulatory Visit: Payer: Self-pay

## 2022-10-11 ENCOUNTER — Ambulatory Visit (HOSPITAL_COMMUNITY)
Admission: RE | Admit: 2022-10-11 | Discharge: 2022-10-11 | Disposition: A | Discharge status: Home / Self Care | Payer: Medicare PPO | Attending: Internal Medicine | Admitting: Internal Medicine

## 2022-10-11 ENCOUNTER — Encounter (HOSPITAL_COMMUNITY): Payer: Self-pay | Admitting: Internal Medicine

## 2022-10-11 ENCOUNTER — Ambulatory Visit (HOSPITAL_COMMUNITY): Payer: Medicare PPO

## 2022-10-11 ENCOUNTER — Encounter (HOSPITAL_COMMUNITY)
Admission: RE | Disposition: A | Discharge status: Home / Self Care | Payer: Self-pay | Source: Home / Self Care | Attending: Internal Medicine

## 2022-10-11 DIAGNOSIS — R0602 Shortness of breath: Secondary | ICD-10-CM | POA: Insufficient documentation

## 2022-10-11 DIAGNOSIS — Z7902 Long term (current) use of antithrombotics/antiplatelets: Secondary | ICD-10-CM | POA: Diagnosis not present

## 2022-10-11 DIAGNOSIS — I2510 Atherosclerotic heart disease of native coronary artery without angina pectoris: Secondary | ICD-10-CM | POA: Diagnosis not present

## 2022-10-11 DIAGNOSIS — J449 Chronic obstructive pulmonary disease, unspecified: Secondary | ICD-10-CM | POA: Diagnosis not present

## 2022-10-11 DIAGNOSIS — J90 Pleural effusion, not elsewhere classified: Secondary | ICD-10-CM | POA: Insufficient documentation

## 2022-10-11 DIAGNOSIS — I10 Essential (primary) hypertension: Secondary | ICD-10-CM | POA: Diagnosis not present

## 2022-10-11 DIAGNOSIS — F17210 Nicotine dependence, cigarettes, uncomplicated: Secondary | ICD-10-CM | POA: Diagnosis not present

## 2022-10-11 DIAGNOSIS — F17290 Nicotine dependence, other tobacco product, uncomplicated: Secondary | ICD-10-CM | POA: Diagnosis not present

## 2022-10-11 DIAGNOSIS — J9811 Atelectasis: Secondary | ICD-10-CM | POA: Diagnosis not present

## 2022-10-11 DIAGNOSIS — J4489 Other specified chronic obstructive pulmonary disease: Secondary | ICD-10-CM | POA: Diagnosis not present

## 2022-10-11 HISTORY — PX: THORACENTESIS: SHX235

## 2022-10-11 SURGERY — THORACENTESIS
Anesthesia: LOCAL

## 2022-10-11 NOTE — H&P (Signed)
Luis [name] is an 82 y.o. male.   Chief Complaint: Shortness of breath HPI: 82 year old male active smoker, COPD/asthma and chronic right-sided pleural effusion who presented with increasing shortness of breath, on workup he was noted to have recurrence of right-sided pleural effusion, today he presented for thoracentesis.  Patient denies chest pain, palpitation or other complaints  Past Medical History:  Diagnosis Date   Adenocarcinoma of cecum (HCC) 06/21/2017   Arthritis    "mid back; hands; knees" (02/24/2015)   Asthmatic bronchitis with acute exacerbation 06/27/2021   Basal cell carcinoma    left shoulder; mid chest; right eyelid (02/24/2015)   CAD (coronary artery disease)    a. 08/2014 Cath/PCI: LM nl, LAD 30p, D1 95 (2.25x12 Resolute Integrity DES), LCX small, RI 100 (attempted PCI) - branches fill via L->L collats, RCA dominant, nl, RPDA/PLA nl, EF 60^. b. 02/24/2015 PCI CTO of Ramus DES x2.   Chronic bronchitis (HCC)    hx   Diverticulosis 05/2005   Elevated lipase    Fuchs' corneal dystrophy    History of adenomatous polyp of colon 05/2005   8 mm adenoma   History of blood transfusion    "related to some of my surgeries"   Hyperlipidemia    Hypertension    Iron deficiency anemia due to chronic blood loss 06/21/2017   Pneumonia    Prostate cancer (HCC) 2011   S/P seed implant   Sarcoidosis    Thrombocytopenia (HCC)    a. Noted on prior labs, unclear of what w/u done.   Trifascicular block     Past Surgical History:  Procedure Laterality Date   BASAL CELL CARCINOMA EXCISION Left    shoulder   CARDIAC CATHETERIZATION N/A 02/24/2015   Procedure: Coronary/Bypass Graft CTO Intervention;  Surgeon: Corky Crafts, MD;  Location: [facility] INVASIVE CV LAB;  Service: Cardiovascular;  Laterality: N/A;   CARDIAC CATHETERIZATION  02/24/2015   Procedure: Coronary/Graft Atherectomy;  Surgeon: Corky Crafts, MD;  Location: [facility] INVASIVE CV LAB;  Service: Cardiovascular;;    CATARACT EXTRACTION W/ INTRAOCULAR LENS  IMPLANT, BILATERAL Bilateral ~ 2011-2012   COLONOSCOPY W/ POLYPECTOMY  06/08/2005 and 10/18/2010   8 mm adenoma 2006, none 2012. Diverticulosis and internal hemorrhoids.   CORNEAL TRANSPLANT Bilateral ~ 2011-2012   "@ same time as cataract OR"   CORONARY ANGIOPLASTY WITH STENT PLACEMENT  08/2014; 02/24/2015   "1 stent + 1 stent"   CORONARY STENT INTERVENTION N/A 01/02/2019   Procedure: CORONARY STENT INTERVENTION;  Surgeon: Corky Crafts, MD;  Location: [facility] INVASIVE CV LAB;  Service: Cardiovascular;  Laterality: N/A;   EYE SURGERY     FINGER SURGERY Right 2014   "reattached middle finger"   INSERTION PROSTATE RADIATION SEED  ~ 2011   IR IMAGING GUIDED PORT INSERTION  03/04/2019   IR RADIOLOGIST EVAL & MGMT  05/26/2020   IR US GUIDE BX ASP/DRAIN  03/04/2019   LAPAROSCOPIC CHOLECYSTECTOMY  2015   LAPAROSCOPIC PARTIAL COLECTOMY N/A 07/26/2017   Procedure: LAPAROSCOPIC PARTIAL COLECTOMY, EXCISION SCROTAL CYST;  Surgeon: Gaynelle Adu, MD;  Location: [facility] ORS;  Service: General;  Laterality: N/A;   LEFT HEART CATH AND CORONARY ANGIOGRAPHY N/A 01/02/2019   Procedure: LEFT HEART CATH AND CORONARY ANGIOGRAPHY;  Surgeon: Corky Crafts, MD;  Location: [facility] INVASIVE CV LAB;  Service: Cardiovascular;  Laterality: N/A;   LEFT HEART CATHETERIZATION WITH CORONARY ANGIOGRAM N/A 08/12/2014   Procedure: LEFT HEART CATHETERIZATION WITH CORONARY ANGIOGRAM;  Surgeon: Micheline Chapman, MD;  Location: [facility]  CATH LAB;  Service: Cardiovascular;  Laterality: N/A;   LYMPH NODE BIOPSY     mid chest   MOHS SURGERY Right ~ 2011   "eyelid; for basal cell"   RADIOLOGY WITH ANESTHESIA N/A 06/30/2020   Procedure: IR WITH ANESTHESIA MICROWAVE ABLATION;  Surgeon: Gilmer Mor, DO;  Location: [facility] ORS;  Service: Anesthesiology;  Laterality: N/A;   RIGHT/LEFT HEART CATH AND CORONARY ANGIOGRAPHY N/A 04/25/2022   Procedure: RIGHT/LEFT HEART CATH AND CORONARY ANGIOGRAPHY;  Surgeon: Corky Crafts, MD;  Location: [facility] INVASIVE CV LAB;  Service: Cardiovascular;  Laterality: N/A;   THORACENTESIS N/A 02/11/2021   Procedure: Alanson Puls;  Surgeon: Karren Burly, MD;  Location: [facility] ENDOSCOPY;  Service: Pulmonary;  Laterality: N/A;   THORACENTESIS Right 04/04/2021   Procedure: THORACENTESIS;  Surgeon: Lorin Glass, MD;  Location: [facility] ENDOSCOPY;  Service: Pulmonary;  Laterality: Right;   THORACENTESIS Right 08/16/2021   Procedure: THORACENTESIS;  Surgeon: Cheri Fowler, MD;  Location: [facility] ENDOSCOPY;  Service: Pulmonary;  Laterality: Right;   THORACENTESIS Right 11/29/2021   Procedure: THORACENTESIS;  Surgeon: Martina Sinner, MD;  Location: [facility] ENDOSCOPY;  Service: Pulmonary;  Laterality: Right;   THORACENTESIS N/A 03/17/2022   Procedure: Alanson Puls;  Surgeon: Karren Burly, MD;  Location: [facility] ENDOSCOPY;  Service: Pulmonary;  Laterality: N/A;   THORACENTESIS N/A 07/14/2022   Procedure: Alanson Puls;  Surgeon: Josephine Igo, DO;  Location: [facility] ENDOSCOPY;  Service: Pulmonary;  Laterality: N/A;   THOROCOTOMY WITH LOBECTOMY Right ~ 1991   partial removal right lung   TONSILLECTOMY  ~ 1950    Family History  Problem Relation Age of Onset   Heart disease Father 105       Died from "hardening of the arteries" age 82   Coronary artery disease Sister 42   Heart attack Sister    Colon cancer Neg Hx    Throat cancer Neg Hx    Pancreatic cancer Neg Hx    Prostate cancer Neg Hx    Stroke Neg Hx    Hypertension Neg Hx    Social History:  reports that he has been smoking cigars and cigarettes. He has never used smokeless tobacco. He reports current alcohol use of about 2.0 standard drinks of alcohol per week. He reports that he does not use drugs.  Allergies: No Known Allergies  Medications Prior to Admission  Medication Sig Dispense Refill   acetaminophen (TYLENOL) 500 MG tablet Take 500-1,000 mg by mouth every 6 (six) hours as needed for moderate pain.     albuterol  (PROVENTIL) (2.5 MG/3ML) 0.083% nebulizer solution Take 3 mLs (2.5 mg total) by nebulization every 6 (six) hours as needed for wheezing or shortness of breath. (Patient taking differently: Take 2.5 mg by nebulization 2 (two) times daily.) 75 mL 5   albuterol (VENTOLIN HFA) 108 (90 Base) MCG/ACT inhaler INHALE 1-2 PUFFS BY MOUTH EVERY 6 HOURS AS NEEDED FOR WHEEZE OR SHORTNESS OF BREATH 18 each 3   Budeson-Glycopyrrol-Formoterol (BREZTRI AEROSPHERE) 160-9-4.8 MCG/ACT AERO Inhale 2 puffs into the lungs in the morning and at bedtime. 1 each 5   Cholecalciferol (VITAMIN D3) 2000 units capsule Take 2,000 Units by mouth daily.     clopidogrel (PLAVIX) 75 MG tablet TAKE 1 TABLET BY MOUTH EVERY DAY 90 tablet 3   ezetimibe (ZETIA) 10 MG tablet Take 1 tablet (10 mg total) by mouth daily. 90 tablet 3   fluticasone (FLONASE) 50 MCG/ACT nasal spray SPRAY 2 SPRAYS INTO EACH NOSTRIL EVERY DAY 48 mL  3   furosemide (LASIX) 20 MG tablet Take 1 tablet (20 mg total) by mouth daily. 30 tablet 2   gabapentin (NEURONTIN) 300 MG capsule Take 300 mg by mouth daily as needed (neuropathy).     ibuprofen (ADVIL) 200 MG tablet Take 200-400 mg by mouth every 6 (six) hours as needed for moderate pain or mild pain.     metaxalone (SKELAXIN) 800 MG tablet Take 800 mg by mouth daily as needed for muscle spasms.     metoprolol succinate (TOPROL XL) 50 MG 24 hr tablet Take 1 tablet (50 mg total) by mouth every evening. Take with or immediately following a meal. 90 tablet 2   Multiple Vitamins-Minerals (VITRUM SENIOR) TABS Take 1 tablet by mouth daily.     naproxen sodium (ALEVE) 220 MG tablet Take 440 mg by mouth 2 (two) times daily as needed (pain).     nitroGLYCERIN (NITROSTAT) 0.4 MG SL tablet PLACE 1 TABLET UNDER THE TONGUE EVERY 5 MINUTES AS NEEDED FOR CHEST PAIN FOR 3 DOSES 25 tablet 3   ranolazine (RANEXA) 500 MG 12 hr tablet Take 500 mg by mouth 2 (two) times daily.     rosuvastatin (CRESTOR) 20 MG tablet Take 1 tablet (20 mg  total) by mouth daily. 90 tablet 3   traMADol (ULTRAM) 50 MG tablet Take 2 tablets (100 mg total) by mouth every 6 (six) hours as needed. (Patient taking differently: Take 100 mg by mouth every 6 (six) hours as needed for severe pain or moderate pain.) 10 tablet 0   cetirizine (ZYRTEC) 10 MG tablet TAKE 1 TABLET BY MOUTH EVERY DAY (Patient not taking: Reported on 10/10/2022) 90 tablet 1    No results found for this or any previous visit (from the past 48 hour(s)). No results found.  12 point review of systems significant for complaint mentioned HPI, rest negative  There were no vitals taken for this visit. Physical exam: General: Elderly male, lying on the bed HEENT: [place]/AT, eyes anicteric.  moist mucus membranes Neuro: Alert, awake following commands Chest: Reduced air entry at the bases on right side, no wheezes or rhonchi Heart: Regular rate and rhythm, no murmurs or gallops Abdomen: Soft, nontender, nondistended, bowel sounds present Skin: No rash   Assessment/Plan Recurrent right-sided pleural effusion  Will proceed with thoracentesis  Cheri Fowler, MD 10/11/2022, 1:57 PM

## 2022-10-11 NOTE — Brief Op Note (Signed)
Thoracentesis  Procedure Note  Luis [name]  [id number]  [birth year]  Date:10/11/22  Time:2:44 PM   Provider Performing:Janecia Palau   Procedure: Thoracentesis with imaging guidance ([id number])  Indication(s) Pleural Effusion  Consent Risks of the procedure as well as the alternatives and risks of each were explained to the patient and/or caregiver.  Consent for the procedure was obtained and is signed in the bedside chart  Anesthesia Topical only with 1% lidocaine    Time Out Verified patient identification, verified procedure, site/side was marked, verified correct patient position, special equipment/implants available, medications/allergies/relevant history reviewed, required imaging and test results available.   Sterile Technique Maximal sterile technique including full sterile barrier drape, hand hygiene, sterile gown, sterile gloves, mask, hair covering, sterile ultrasound probe cover (if used).  Procedure Description Ultrasound was used to identify appropriate pleural anatomy for placement and overlying skin marked.  Area of drainage cleaned and draped in sterile fashion. Lidocaine was used to anesthetize the skin and subcutaneous tissue. 1100 cc's of yellowish appearing fluid was drained from the right pleural space. Catheter then removed and bandaid applied to site.   Complications/Tolerance None; patient tolerated the procedure well. Chest X-ray is ordered to confirm no post-procedural complication.   EBL Minimal   Specimen(s) None

## 2022-10-14 DIAGNOSIS — J449 Chronic obstructive pulmonary disease, unspecified: Secondary | ICD-10-CM | POA: Diagnosis not present

## 2022-10-14 DIAGNOSIS — Z923 Personal history of irradiation: Secondary | ICD-10-CM | POA: Diagnosis not present

## 2022-10-14 DIAGNOSIS — S0121XA Laceration without foreign body of nose, initial encounter: Secondary | ICD-10-CM | POA: Diagnosis not present

## 2022-10-14 DIAGNOSIS — I110 Hypertensive heart disease with heart failure: Secondary | ICD-10-CM | POA: Diagnosis not present

## 2022-10-14 DIAGNOSIS — I5032 Chronic diastolic (congestive) heart failure: Secondary | ICD-10-CM | POA: Diagnosis not present

## 2022-10-14 DIAGNOSIS — C189 Malignant neoplasm of colon, unspecified: Secondary | ICD-10-CM | POA: Diagnosis not present

## 2022-10-14 DIAGNOSIS — S0990XA Unspecified injury of head, initial encounter: Secondary | ICD-10-CM | POA: Diagnosis not present

## 2022-10-14 DIAGNOSIS — S80211A Abrasion, right knee, initial encounter: Secondary | ICD-10-CM | POA: Diagnosis not present

## 2022-10-14 DIAGNOSIS — S60811A Abrasion of right wrist, initial encounter: Secondary | ICD-10-CM | POA: Diagnosis not present

## 2022-10-16 ENCOUNTER — Encounter (HOSPITAL_COMMUNITY): Payer: Self-pay | Admitting: Internal Medicine

## 2022-10-16 ENCOUNTER — Other Ambulatory Visit: Payer: Self-pay

## 2022-10-16 ENCOUNTER — Other Ambulatory Visit: Payer: Self-pay | Admitting: Interventional Cardiology

## 2022-10-16 MED ORDER — RANOLAZINE ER 500 MG PO TB12: 500.0000 mg | ORAL_TABLET | Freq: Two times a day (BID) | ORAL | 3 refills | Status: DC

## 2022-10-16 MED ORDER — NITROGLYCERIN 0.4 MG SL SUBL: SUBLINGUAL_TABLET | SUBLINGUAL | 11 refills | Status: DC

## 2022-10-16 NOTE — Telephone Encounter (Signed)
Pt's medication was sent to pt's pharmacy as requested. Confirmation received.  °

## 2022-10-23 ENCOUNTER — Ambulatory Visit: Payer: Medicare PPO | Admitting: Pulmonary Disease

## 2022-10-30 ENCOUNTER — Ambulatory Visit: Payer: Medicare PPO | Admitting: Pulmonary Disease

## 2022-10-30 ENCOUNTER — Encounter: Payer: Self-pay | Admitting: Pulmonary Disease

## 2022-10-30 VITALS — BP 122/76 | HR 67 | Temp 98.3°F | Ht 71.0 in | Wt 238.2 lb

## 2022-10-30 DIAGNOSIS — J449 Chronic obstructive pulmonary disease, unspecified: Secondary | ICD-10-CM | POA: Diagnosis not present

## 2022-10-30 DIAGNOSIS — J90 Pleural effusion, not elsewhere classified: Secondary | ICD-10-CM | POA: Diagnosis not present

## 2022-10-30 DIAGNOSIS — J4540 Moderate persistent asthma, uncomplicated: Secondary | ICD-10-CM | POA: Diagnosis not present

## 2022-10-30 NOTE — Patient Instructions (Addendum)
Nice to see you again  Continue the Breztri  No changes to medications  Return to clinic in 3 months or sooner as needed

## 2022-10-30 NOTE — Progress Notes (Signed)
@Patient  ID: Luis [name], male    DOB: 11/26/1940, 82 y.o.   MRN: [id number]  Chief Complaint  Patient presents with   Follow-up    Improved cough and wheezing     Referring provider: HamrickDurward Fortes, MD  HPI:   82 y.o. man whom we are seeing in follow-up of multiple pulmonary issues including burnt out sarcoid, asthma, ongoing chronic of right pleural effusion.  Most recent oncology note from [facility] reviewed.  Doing okay.  At last visit was treated for asthma exacerbation.  Contact me in the meantime concern for volume or pleural effusion reaccumulation.  Underwent successful thoracentesis in the meantime.  Has helped with symptoms of dyspnea etc.  Still dealing with cough and congestion with some day-to-day variation.  Some days ago, some days bad.   HPI at initial visit: Overall, disorders are largely unchanged.  Formally followed with Dr. Kendrick Fries for sarcoidosis.  This has been quiescent for some time.  Most recent CT scan reviewed which reveals on my interpretation stable bilateral fibrosis, scattered calcified granulomas in the lung as well as significant calcified hilar and mediastinal lymphadenopathy and sequela of prior lung surgery.  He has been seen in clinic recently by Elisha Headland.  Primary complaint was dyspnea on exertion as well as cough.  These persist.  Chest imaging obtained as above.  PFTs obtained last week which reveal suggestion of mild to moderate restriction on spirometry without significant bronchodilator response.  Moderate restriction confirmed with total lung capacity 69% of predicted.  DLCO corrects to within normal limits with adjustment for hemoglobin.  Cough and shortness of breath coincide with worsening nasal congestion.  Postnasal drip.  Not really using meds to address this this.  He is tolerating chemotherapy well.  Review of labs indicate stable hemoglobin without concern for symptomatic anemia at this time.   Questionaires / Pulmonary Flowsheets:    ACT:      No data to display           MMRC: mMRC Dyspnea Scale mMRC Score  08/09/2020  3:42 PM 0    Epworth:      No data to display           Tests:   FENO:  No results found for: "NITRICOXIDE"  PFT:    Latest Ref Rng & Units 03/10/2020    3:36 PM 08/30/2017    3:59 PM  PFT Results  FVC-Pre L 2.94  P 3.14   FVC-Predicted Pre % 68  P 72   FVC-Post L 2.90  P 3.18   FVC-Predicted Post % 67  P 73   Pre FEV1/FVC % % 66  P 70   Post FEV1/FCV % % 68  P 73   FEV1-Pre L 1.93  P 2.19   FEV1-Predicted Pre % 62  P 69   FEV1-Post L 1.96  P 2.31   DLCO uncorrected ml/min/mmHg 19.10  P 17.73   DLCO UNC% % 75  P 52   DLCO corrected ml/min/mmHg 21.40  P 20.68   DLCO COR %Predicted % 84  P 61   DLVA Predicted % 118  P 98   TLC L 5.02  P 4.99   TLC % Predicted % 69  P 68   RV % Predicted % 76  P 71     P Preliminary result   Personally reviewed and interpreted as normal spirometry, reduced DLCO, moderate reduced TLC  WALK:     08/28/2022    3:17  PM 08/28/2017   12:00 PM  SIX MIN WALK  Supplimental Oxygen during Test? (L/min)  No  Tech Comments: Patient was able to finish walk with sats in the 90's. pt walked a fast pace, tolerated walk well.     Imaging: Personally reviewed and as per EMR and discussion in this note  DG CHEST PORT 1 VIEW  Result Date: 10/11/2022 CLINICAL DATA:  Post thoracentesis EXAM: PORTABLE CHEST 1 VIEW COMPARISON:  Portable exam 1511 hours compared to 08/28/2022 FINDINGS: RIGHT jugular Port-A-Cath with tip projecting over SVC. Enlargement of cardiac silhouette. Mediastinal contours and pulmonary vascularity normal. Calcified pulmonary granulomata and calcified mediastinal/hilar lymph nodes again seen. Decreased RIGHT pleural effusion and basilar atelectasis. No pneumothorax following thoracentesis. No new infiltrate identified. IMPRESSION: Decreased RIGHT pleural effusion and basilar atelectasis without pneumothorax post thoracentesis.  Enlargement of cardiac silhouette. Old granulomatous disease. Electronically Signed   By: Ulyses Southward M.D.   On: 10/11/2022 15:30    Lab Results: Personally reviewed, eosinophils routinely 200-300 CBC    Component Value Date/Time   WBC 4.5 08/01/2021 0857   WBC 4.7 05/29/2021 0359   RBC 4.83 08/01/2021 0857   RBC 4.05 (L) 05/29/2021 0359   HGB 11.6 (L) 04/25/2022 0802   HGB 11.9 (L) 04/25/2022 0802   HGB 12.7 (L) 08/01/2021 0857   HCT 34.0 (L) 04/25/2022 0802   HCT 35.0 (L) 04/25/2022 0802   HCT 40.6 08/01/2021 0857   PLT 171 08/01/2021 0857   MCV 84 08/01/2021 0857   MCH 26.3 (L) 08/01/2021 0857   MCH 25.2 (L) 05/29/2021 0359   MCHC 31.3 (L) 08/01/2021 0857   MCHC 30.8 05/29/2021 0359   RDW 20.1 (H) 08/01/2021 0857   LYMPHSABS 0.4 (L) 05/29/2021 0054   MONOABS 1.0 05/29/2021 0054   EOSABS 0.1 05/29/2021 0054   BASOSABS 0.0 05/29/2021 0054    BMET    Component Value Date/Time   NA 137 04/25/2022 0802   NA 137 04/25/2022 0802   NA 141 09/09/2021 1719   K 3.8 04/25/2022 0802   K 3.8 04/25/2022 0802   CL 99 09/09/2021 1719   CO2 24 09/09/2021 1719   GLUCOSE 100 (H) 09/09/2021 1719   GLUCOSE 112 (H) 05/29/2021 0359   BUN 15 09/09/2021 1719   CREATININE 0.80 09/09/2021 1719   CREATININE 0.83 06/07/2020 1119   CREATININE 1.02 05/16/2016 0937   CALCIUM 9.5 09/09/2021 1719   GFRNONAA >60 05/29/2021 0359   GFRNONAA >60 06/07/2020 1119   GFRAA >60 03/10/2020 0855    BNP    Component Value Date/Time   BNP 79.4 01/22/2021 1634   BNP 43.4 05/16/2016 0937    ProBNP    Component Value Date/Time   PROBNP 69 08/01/2021 0857   PROBNP 60.0 08/10/2014 1052    Specialty Problems       Pulmonary Problems   Pulmonary fibrosis (HCC)   DOE (dyspnea on exertion)   Asthmatic bronchitis with acute exacerbation   Pleural effusion   Acute exacerbation of COPD with asthma (HCC)    No Known Allergies  Immunization History  Administered Date(s) Administered   DTaP /  IPV 09/25/2012   Fluad Quad(high Dose 65+) 02/19/2019, 03/14/2022   Influenza Split 03/27/2014   Influenza, High Dose Seasonal PF 03/27/2014, 01/27/2017, 02/22/2018, 03/12/2021   Influenza-Unspecified 02/24/2017, 03/10/2020   PFIZER Comirnaty(Gray Top)Covid-19 Tri-Sucrose Vaccine 06/27/2019, 07/21/2019   PFIZER(Purple Top)SARS-COV-2 Vaccination 07/11/2019, 07/21/2019, 01/26/2020, 10/26/2020   Pneumococcal Conjugate-13 03/27/2014   Pneumococcal Polysaccharide-23 11/10/2009   Pneumococcal-Unspecified 03/12/2014  Tdap 03/24/2011, 09/25/2012, 05/28/2021   Zoster Recombinat (Shingrix) 09/01/2018, 02/19/2019   Zoster, Live 03/21/2010    Past Medical History:  Diagnosis Date   Adenocarcinoma of cecum (HCC) 06/21/2017   Arthritis    "mid back; hands; knees" (02/24/2015)   Asthmatic bronchitis with acute exacerbation 06/27/2021   Basal cell carcinoma    left shoulder; mid chest; right eyelid (02/24/2015)   CAD (coronary artery disease)    a. 08/2014 Cath/PCI: LM nl, LAD 30p, D1 95 (2.25x12 Resolute Integrity DES), LCX small, RI 100 (attempted PCI) - branches fill via L->L collats, RCA dominant, nl, RPDA/PLA nl, EF 60^. b. 02/24/2015 PCI CTO of Ramus DES x2.   Chronic bronchitis (HCC)    hx   Diverticulosis 05/2005   Elevated lipase    Fuchs' corneal dystrophy    History of adenomatous polyp of colon 05/2005   8 mm adenoma   History of blood transfusion    "related to some of my surgeries"   Hyperlipidemia    Hypertension    Iron deficiency anemia due to chronic blood loss 06/21/2017   Pneumonia    Prostate cancer (HCC) 2011   S/P seed implant   Sarcoidosis    Thrombocytopenia (HCC)    a. Noted on prior labs, unclear of what w/u done.   Trifascicular block     Tobacco History: Social History   Tobacco Use  Smoking Status Light Smoker   Packs/day: 0.00   Years: 59.00   Additional pack years: 0.00   Total pack years: 0.00   Types: Cigars, Cigarettes   Last attempt to quit:  02/24/2015   Years since quitting: 7.6  Smokeless Tobacco Never  Tobacco Comments   02/24/2015 "quit cigarettes ~ 2000 ago but smokes cigars about once every two months. ARJ 10/30/22   Ready to quit: Not Answered Counseling given: Not Answered Tobacco comments: 02/24/2015 "quit cigarettes ~ 2000 ago but smokes cigars about once every two months. ARJ 10/30/22   Continue to not smoke  Outpatient Encounter Medications as of 10/30/2022  Medication Sig   acetaminophen (TYLENOL) 500 MG tablet Take 500-1,000 mg by mouth every 6 (six) hours as needed for moderate pain.   albuterol (PROVENTIL) (2.5 MG/3ML) 0.083% nebulizer solution Take 3 mLs (2.5 mg total) by nebulization every 6 (six) hours as needed for wheezing or shortness of breath. (Patient taking differently: Take 2.5 mg by nebulization 2 (two) times daily.)   albuterol (VENTOLIN HFA) 108 (90 Base) MCG/ACT inhaler INHALE 1-2 PUFFS BY MOUTH EVERY 6 HOURS AS NEEDED FOR WHEEZE OR SHORTNESS OF BREATH   Budeson-Glycopyrrol-Formoterol (BREZTRI AEROSPHERE) 160-9-4.8 MCG/ACT AERO Inhale 2 puffs into the lungs in the morning and at bedtime.   Cholecalciferol (VITAMIN D3) 2000 units capsule Take 2,000 Units by mouth daily.   clopidogrel (PLAVIX) 75 MG tablet TAKE 1 TABLET BY MOUTH EVERY DAY   ezetimibe (ZETIA) 10 MG tablet Take 1 tablet (10 mg total) by mouth daily.   fluticasone (FLONASE) 50 MCG/ACT nasal spray SPRAY 2 SPRAYS INTO EACH NOSTRIL EVERY DAY   furosemide (LASIX) 20 MG tablet Take 1 tablet (20 mg total) by mouth daily.   gabapentin (NEURONTIN) 300 MG capsule Take 300 mg by mouth daily as needed (neuropathy).   ibuprofen (ADVIL) 200 MG tablet Take 200-400 mg by mouth every 6 (six) hours as needed for moderate pain or mild pain.   metaxalone (SKELAXIN) 800 MG tablet Take 800 mg by mouth daily as needed for muscle spasms.   metoprolol succinate (TOPROL  XL) 50 MG 24 hr tablet Take 1 tablet (50 mg total) by mouth every evening. Take with or  immediately following a meal.   Multiple Vitamins-Minerals (VITRUM SENIOR) TABS Take 1 tablet by mouth daily.   naproxen sodium (ALEVE) 220 MG tablet Take 440 mg by mouth 2 (two) times daily as needed (pain).   nitroGLYCERIN (NITROSTAT) 0.4 MG SL tablet PLACE 1 TABLET UNDER THE TONGUE EVERY 5 MINUTES AS NEEDED FOR CHEST PAIN FOR 3 DOSES   ranolazine (RANEXA) 500 MG 12 hr tablet Take 1 tablet (500 mg total) by mouth 2 (two) times daily.   rosuvastatin (CRESTOR) 20 MG tablet Take 1 tablet (20 mg total) by mouth daily.   traMADol (ULTRAM) 50 MG tablet Take 2 tablets (100 mg total) by mouth every 6 (six) hours as needed. (Patient taking differently: Take 100 mg by mouth every 6 (six) hours as needed for severe pain or moderate pain.)   [DISCONTINUED] cetirizine (ZYRTEC) 10 MG tablet TAKE 1 TABLET BY MOUTH EVERY DAY   No facility-administered encounter medications on file as of 10/30/2022.     Review of Systems  N/a  Physical Exam  BP 122/76 (BP Location: Right Arm, Patient Position: Sitting, Cuff Size: Normal)   Pulse 67   Temp 98.3 F (36.8 C) (Oral)   Ht 5\' 11"  (1.803 m)   Wt 238 lb 3.2 oz (108 kg)   SpO2 96%   BMI 33.22 kg/m   Wt Readings from Last 5 Encounters:  10/30/22 238 lb 3.2 oz (108 kg)  10/11/22 234 lb (106.1 kg)  09/12/22 238 lb 9.6 oz (108.2 kg)  09/04/22 234 lb 9.6 oz (106.4 kg)  08/28/22 235 lb (106.6 kg)    BMI Readings from Last 5 Encounters:  10/30/22 33.22 kg/m  10/11/22 33.58 kg/m  09/12/22 34.24 kg/m  09/04/22 33.66 kg/m  08/28/22 33.24 kg/m     Physical Exam General: Well-appearing, no acute distress Eyes: EOMI, no icterus Neck: No JVD supple Respiratory: Clear and even, symmetric air entry bilaterally Cardiovascular: Regular rhythm, no murmurs Abdomen: Nondistended, bowel sounds present Psych: Normal mood, full affect   Assessment & Plan:   Recurrent right pleural effusion: Suspect related to local inflammation due to metastasis to  liver, liver resection.  Cytology negative.  Improvement in dyspnea with drainage.  Serial thoracentesis as needed, will need to hold Plavix for 5 days in advance which he is well aware of and help coordinate.  At this point going 3 to 4 months between drainage.  Discussed Pleurx in the past and today.  Defer indwelling catheter at patient request given success with intermittent drainage.  Asthma: Day-to-day variation in symptoms.  Breztri seems to control things okay.  Still with cough and congestion at times.  Warning signs for exacerbation discussed today.  Sarcoidosis: Burnt out, no evidence of active disease on recent imaging.  Return in about 3 months (around 01/30/2023).   Karren Burly, MD 10/30/2022

## 2022-11-04 ENCOUNTER — Other Ambulatory Visit: Payer: Self-pay | Admitting: Nurse Practitioner

## 2022-11-30 DIAGNOSIS — J449 Chronic obstructive pulmonary disease, unspecified: Secondary | ICD-10-CM | POA: Diagnosis not present

## 2022-12-11 ENCOUNTER — Encounter: Payer: Self-pay | Admitting: Pulmonary Disease

## 2022-12-12 NOTE — Telephone Encounter (Signed)
Dr. Judeth Horn- pt sent the following to you as FYI:    Luis [name] "Luis [name]"  P [facility] Pool Phone Number: [phone]   Susy Frizzle,  I tried to leave a personal message at the office, but nobody returned my call.  So, the next Walk to [place] which you noted you might be interested in takes place September 12 thru 15.    If you are interested in attending let me know and I can sponsor you.  There is no charge.

## 2022-12-25 ENCOUNTER — Encounter: Payer: Self-pay | Admitting: Pulmonary Disease

## 2022-12-25 DIAGNOSIS — J90 Pleural effusion, not elsewhere classified: Secondary | ICD-10-CM

## 2022-12-30 DIAGNOSIS — J449 Chronic obstructive pulmonary disease, unspecified: Secondary | ICD-10-CM | POA: Diagnosis not present

## 2023-01-01 DIAGNOSIS — Z79899 Other long term (current) drug therapy: Secondary | ICD-10-CM | POA: Diagnosis not present

## 2023-01-01 DIAGNOSIS — D869 Sarcoidosis, unspecified: Secondary | ICD-10-CM | POA: Diagnosis not present

## 2023-01-01 DIAGNOSIS — K769 Liver disease, unspecified: Secondary | ICD-10-CM | POA: Diagnosis not present

## 2023-01-01 DIAGNOSIS — J90 Pleural effusion, not elsewhere classified: Secondary | ICD-10-CM | POA: Diagnosis not present

## 2023-01-01 DIAGNOSIS — C182 Malignant neoplasm of ascending colon: Secondary | ICD-10-CM | POA: Diagnosis not present

## 2023-01-01 DIAGNOSIS — K209 Esophagitis, unspecified without bleeding: Secondary | ICD-10-CM | POA: Diagnosis not present

## 2023-01-01 DIAGNOSIS — J8410 Pulmonary fibrosis, unspecified: Secondary | ICD-10-CM | POA: Diagnosis not present

## 2023-01-01 DIAGNOSIS — C787 Secondary malignant neoplasm of liver and intrahepatic bile duct: Secondary | ICD-10-CM | POA: Diagnosis not present

## 2023-01-01 DIAGNOSIS — C180 Malignant neoplasm of cecum: Secondary | ICD-10-CM | POA: Diagnosis not present

## 2023-01-02 ENCOUNTER — Ambulatory Visit (HOSPITAL_COMMUNITY)
Admission: RE | Admit: 2023-01-02 | Discharge: 2023-01-02 | Disposition: A | Discharge status: Home / Self Care | Payer: Medicare PPO | Source: Ambulatory Visit | Attending: Pulmonary Disease | Admitting: Pulmonary Disease

## 2023-01-02 ENCOUNTER — Encounter (HOSPITAL_COMMUNITY)
Admission: RE | Disposition: A | Discharge status: Home / Self Care | Payer: Self-pay | Source: Ambulatory Visit | Attending: Pulmonary Disease

## 2023-01-02 DIAGNOSIS — J90 Pleural effusion, not elsewhere classified: Secondary | ICD-10-CM | POA: Insufficient documentation

## 2023-01-02 DIAGNOSIS — Z538 Procedure and treatment not carried out for other reasons: Secondary | ICD-10-CM | POA: Insufficient documentation

## 2023-01-02 SURGERY — CANCELLED PROCEDURE

## 2023-01-02 NOTE — Progress Notes (Signed)
After the site was evaluated using the ultrasound machine it was noted that there was not enough fluid to be drained today per Dr. Judeth Horn, so the procedure was cancelled. Pt verbalized understanding and was discharged home. Weston Settle, RN

## 2023-01-08 DIAGNOSIS — C180 Malignant neoplasm of cecum: Secondary | ICD-10-CM | POA: Diagnosis not present

## 2023-01-08 DIAGNOSIS — C189 Malignant neoplasm of colon, unspecified: Secondary | ICD-10-CM | POA: Diagnosis not present

## 2023-01-08 DIAGNOSIS — C787 Secondary malignant neoplasm of liver and intrahepatic bile duct: Secondary | ICD-10-CM | POA: Diagnosis not present

## 2023-01-12 ENCOUNTER — Inpatient Hospital Stay: Payer: Medicare PPO | Admitting: Oncology

## 2023-01-12 DIAGNOSIS — C787 Secondary malignant neoplasm of liver and intrahepatic bile duct: Secondary | ICD-10-CM | POA: Diagnosis not present

## 2023-01-12 DIAGNOSIS — C189 Malignant neoplasm of colon, unspecified: Secondary | ICD-10-CM | POA: Diagnosis not present

## 2023-01-15 ENCOUNTER — Telehealth: Payer: Self-pay | Admitting: Pulmonary Disease

## 2023-01-16 ENCOUNTER — Encounter: Payer: Self-pay | Admitting: Primary Care

## 2023-01-16 ENCOUNTER — Ambulatory Visit: Payer: Medicare PPO | Admitting: Primary Care

## 2023-01-16 VITALS — BP 122/58 | HR 62 | Temp 98.1°F | Ht 71.0 in | Wt 230.4 lb

## 2023-01-16 DIAGNOSIS — J90 Pleural effusion, not elsewhere classified: Secondary | ICD-10-CM | POA: Diagnosis not present

## 2023-01-16 DIAGNOSIS — J45901 Unspecified asthma with (acute) exacerbation: Secondary | ICD-10-CM | POA: Diagnosis not present

## 2023-01-16 DIAGNOSIS — J441 Chronic obstructive pulmonary disease with (acute) exacerbation: Secondary | ICD-10-CM

## 2023-01-16 MED ORDER — AMOXICILLIN-POT CLAVULANATE 875-125 MG PO TABS: 1.0000 | ORAL_TABLET | Freq: Two times a day (BID) | ORAL | 0 refills | Status: DC

## 2023-01-16 MED ORDER — PREDNISONE 20 MG PO TABS: 20.0000 mg | ORAL_TABLET | Freq: Every day | ORAL | 0 refills | Status: AC

## 2023-01-16 NOTE — Progress Notes (Signed)
@Patient  ID: Luis [name], male    DOB: [birth year], 82 y.o.   MRN: [id number]  Chief Complaint  Patient presents with   Acute Visit    Cough, wheeze, chest congestion since 01/13/2023. No fever.    Referring provider: Ailene Ravel, MD  HPI: 82 year old male, former smoker. PMH HTN, CAD, pleural effusion, asthmatic bronchitis, pulmonary fibrosis, cecal cancer, liver mets, adenocarcinoma. Patient of Dr. Judeth Horn, last seen 10/30/22.   01/16/2023 Patient presents today for acute OV. He developed sore throat, head congestion and cough over the last three days. Cough is non-productive but congested. Dr. Judeth Horn sent in Augmentin and prednisone, he will be picking up prescription after visit.   Overall his breathing has improved since being on [employer]. He reports having less flare ups of his asthma symptoms. He rarely needs to use Albuterol inhaler. He is taking mucinex daily and using flonase nasal spray. CT chest in July 2024 showed decreased small right pleural effusion   No Known Allergies  Immunization History  Administered Date(s) Administered   DTaP / IPV 09/25/2012   Fluad Quad(high Dose 65+) 02/19/2019, 03/14/2022   Influenza Split 03/27/2014   Influenza, High Dose Seasonal PF 03/27/2014, 01/27/2017, 02/22/2018, 03/12/2021   Influenza-Unspecified 02/24/2017, 03/10/2020   PFIZER Comirnaty(Gray Top)Covid-19 Tri-Sucrose Vaccine 07/21/2019   PFIZER(Purple Top)SARS-COV-2 Vaccination 07/11/2019, 07/21/2019, 01/26/2020, 10/26/2020   Pneumococcal Conjugate-13 03/27/2014   Pneumococcal Polysaccharide-23 11/10/2009   Pneumococcal-Unspecified 03/12/2014   Tdap 03/24/2011, 09/25/2012, 05/28/2021   Zoster Recombinant(Shingrix) 09/01/2018, 02/19/2019   Zoster, Live 03/21/2010    Past Medical History:  Diagnosis Date   Adenocarcinoma of cecum (HCC) 06/21/2017   Arthritis    "mid back; hands; knees" (02/24/2015)   Asthmatic bronchitis with acute exacerbation 06/27/2021    Basal cell carcinoma    left shoulder; mid chest; right eyelid (02/24/2015)   CAD (coronary artery disease)    a. 08/2014 Cath/PCI: LM nl, LAD 30p, D1 95 (2.25x12 Resolute Integrity DES), LCX small, RI 100 (attempted PCI) - branches fill via L->L collats, RCA dominant, nl, RPDA/PLA nl, EF 60^. b. 02/24/2015 PCI CTO of Ramus DES x2.   Chronic bronchitis (HCC)    hx   Diverticulosis 05/2005   Elevated lipase    Fuchs' corneal dystrophy    History of adenomatous polyp of colon 05/2005   8 mm adenoma   History of blood transfusion    "related to some of my surgeries"   Hyperlipidemia    Hypertension    Iron deficiency anemia due to chronic blood loss 06/21/2017   Pneumonia    Prostate cancer (HCC) 2011   S/P seed implant   Sarcoidosis    Thrombocytopenia (HCC)    a. Noted on prior labs, unclear of what w/u done.   Trifascicular block     Tobacco History: Social History   Tobacco Use  Smoking Status Light Smoker   Current packs/day: 0.00   Types: Cigars, Cigarettes   Last attempt to quit: 02/24/1956   Years since quitting: 66.9  Smokeless Tobacco Never  Tobacco Comments   02/24/2015 "quit cigarettes ~ 2000 ago but smokes cigars about once every two months. Updated 01/16/2023 am   Ready to quit: Not Answered Counseling given: Not Answered Tobacco comments: 02/24/2015 "quit cigarettes ~ 2000 ago but smokes cigars about once every two months. Updated 01/16/2023 am   Outpatient Medications Prior to Visit  Medication Sig Dispense Refill   acetaminophen (TYLENOL) 500 MG tablet Take 500-1,000 mg by mouth every 6 (six)  hours as needed for moderate pain.     albuterol (PROVENTIL) (2.5 MG/3ML) 0.083% nebulizer solution Take 3 mLs (2.5 mg total) by nebulization every 6 (six) hours as needed for wheezing or shortness of breath. (Patient taking differently: Take 2.5 mg by nebulization 2 (two) times daily.) 75 mL 5   albuterol (VENTOLIN HFA) 108 (90 Base) MCG/ACT inhaler INHALE 1-2 PUFFS BY  MOUTH EVERY 6 HOURS AS NEEDED FOR WHEEZE OR SHORTNESS OF BREATH 18 each 3   amLODipine (NORVASC) 5 MG tablet Take 5 mg by mouth daily.     amoxicillin-clavulanate (AUGMENTIN) 875-125 MG tablet Take 1 tablet by mouth 2 (two) times daily. 14 tablet 0   Budeson-Glycopyrrol-Formoterol (BREZTRI AEROSPHERE) 160-9-4.8 MCG/ACT AERO Inhale 2 puffs into the lungs in the morning and at bedtime. 1 each 5   Cholecalciferol (VITAMIN D3) 2000 units capsule Take 2,000 Units by mouth daily.     clopidogrel (PLAVIX) 75 MG tablet TAKE 1 TABLET BY MOUTH EVERY DAY 90 tablet 3   ezetimibe (ZETIA) 10 MG tablet Take 1 tablet (10 mg total) by mouth daily. 90 tablet 3   fluticasone (FLONASE) 50 MCG/ACT nasal spray SPRAY 2 SPRAYS INTO EACH NOSTRIL EVERY DAY 48 mL 3   furosemide (LASIX) 20 MG tablet Take 1 tablet (20 mg total) by mouth daily. 30 tablet 2   gabapentin (NEURONTIN) 300 MG capsule Take 300 mg by mouth daily as needed (neuropathy).     ibuprofen (ADVIL) 200 MG tablet Take 200-400 mg by mouth every 6 (six) hours as needed for moderate pain or mild pain.     metoprolol succinate (TOPROL-XL) 50 MG 24 hr tablet TAKE 1 TABLET (50 MG TOTAL) BY MOUTH EVERY EVENING. TAKE WITH OR IMMEDIATELY FOLLOWING A MEAL. 90 tablet 3   Multiple Vitamins-Minerals (VITRUM SENIOR) TABS Take 1 tablet by mouth daily.     naproxen sodium (ALEVE) 220 MG tablet Take 440 mg by mouth 2 (two) times daily as needed (pain).     nitroGLYCERIN (NITROSTAT) 0.4 MG SL tablet PLACE 1 TABLET UNDER THE TONGUE EVERY 5 MINUTES AS NEEDED FOR CHEST PAIN FOR 3 DOSES 25 tablet 11   predniSONE (DELTASONE) 20 MG tablet Take 1 tablet (20 mg total) by mouth daily with breakfast for 5 days. 5 tablet 0   ranolazine (RANEXA) 500 MG 12 hr tablet Take 1 tablet (500 mg total) by mouth 2 (two) times daily. 180 tablet 3   rosuvastatin (CRESTOR) 20 MG tablet Take 1 tablet (20 mg total) by mouth daily. 90 tablet 3   traMADol (ULTRAM) 50 MG tablet Take 2 tablets (100 mg total)  by mouth every 6 (six) hours as needed. (Patient taking differently: Take 100 mg by mouth every 6 (six) hours as needed for severe pain or moderate pain.) 10 tablet 0   metaxalone (SKELAXIN) 800 MG tablet Take 800 mg by mouth daily as needed for muscle spasms. (Patient not taking: Reported on 01/16/2023)     No facility-administered medications prior to visit.   Review of Systems  Review of Systems  Constitutional: Negative.   HENT:  Positive for congestion and postnasal drip.   Respiratory:  Positive for cough.    Physical Exam  BP (!) 122/58 (BP Location: Left Arm, Patient Position: Sitting, Cuff Size: Large)   Pulse 62   Temp 98.1 F (36.7 C) (Oral)   Ht 5\' 11"  (1.803 m)   Wt 230 lb 6.4 oz (104.5 kg)   SpO2 93%   BMI 32.13 kg/m  Physical Exam Constitutional:      Appearance: Normal appearance.  HENT:     Head: Normocephalic and atraumatic.     Right Ear: Tympanic membrane normal.     Left Ear: Tympanic membrane normal.     Mouth/Throat:     Mouth: Mucous membranes are moist.     Pharynx: Oropharynx is clear.  Cardiovascular:     Rate and Rhythm: Normal rate and regular rhythm.  Pulmonary:     Effort: Pulmonary effort is normal.     Breath sounds: Wheezing present. No rales.     Comments: Upper airway cough  Musculoskeletal:        General: Normal range of motion.  Skin:    General: Skin is warm and dry.  Neurological:     General: No focal deficit present.     Mental Status: He is alert and oriented to person, place, and time. Mental status is at baseline.  Psychiatric:        Mood and Affect: Mood normal.        Behavior: Behavior normal.        Thought Content: Thought content normal.        Judgment: Judgment normal.      Lab Results:  CBC    Component Value Date/Time   WBC 4.5 08/01/2021 0857   WBC 4.7 05/29/2021 0359   RBC 4.83 08/01/2021 0857   RBC 4.05 (L) 05/29/2021 0359   HGB 11.6 (L) 04/25/2022 0802   HGB 11.9 (L) 04/25/2022 0802   HGB 12.7  (L) 08/01/2021 0857   HCT 34.0 (L) 04/25/2022 0802   HCT 35.0 (L) 04/25/2022 0802   HCT 40.6 08/01/2021 0857   PLT 171 08/01/2021 0857   MCV 84 08/01/2021 0857   MCH 26.3 (L) 08/01/2021 0857   MCH 25.2 (L) 05/29/2021 0359   MCHC 31.3 (L) 08/01/2021 0857   MCHC 30.8 05/29/2021 0359   RDW 20.1 (H) 08/01/2021 0857   LYMPHSABS 0.4 (L) 05/29/2021 0054   MONOABS 1.0 05/29/2021 0054   EOSABS 0.1 05/29/2021 0054   BASOSABS 0.0 05/29/2021 0054    BMET    Component Value Date/Time   NA 137 04/25/2022 0802   NA 137 04/25/2022 0802   NA 141 09/09/2021 1719   K 3.8 04/25/2022 0802   K 3.8 04/25/2022 0802   CL 99 09/09/2021 1719   CO2 24 09/09/2021 1719   GLUCOSE 100 (H) 09/09/2021 1719   GLUCOSE 112 (H) 05/29/2021 0359   BUN 15 09/09/2021 1719   CREATININE 0.80 09/09/2021 1719   CREATININE 0.83 06/07/2020 1119   CREATININE 1.02 05/16/2016 0937   CALCIUM 9.5 09/09/2021 1719   GFRNONAA >60 05/29/2021 0359   GFRNONAA >60 06/07/2020 1119   GFRAA >60 03/10/2020 0855    BNP    Component Value Date/Time   BNP 79.4 01/22/2021 1634   BNP 43.4 05/16/2016 0937    ProBNP    Component Value Date/Time   PROBNP 69 08/01/2021 0857   PROBNP 60.0 08/10/2014 1052    Imaging: No results found.   Assessment & Plan:   Acute exacerbation of COPD with asthma (HCC) - Overall his asthma symptoms have been well controlled over the last several months on [employer]. He is having less flare ups and rarely requires SABA. He developed acute URI/bronchitis symptoms 3 days ago. Cough is congested, non-productive. No recent sick contacts or fevers. RX Augmentin 1 tablet twice daily x 7 days and prednisone taper sent today. He is  wondering if he needs to continue to use [place] everyday. We discussed tapering dose or using as needed. Recommend when he recovers from URI symptoms to try using Breztri Aerosphere 1 puff every 12 hours. Continue mucinex 600mg  twice daily as needed for  cough/congestion. Recommend patient get one time RSV vaccine through his pharmacy and annual covid/influenza vaccine this fall. Follow-up if symptoms do not improve.   Pleural effusion - Hx recurrent right pleural effusion. Cytology negative. Serial thoracentesis as needed. Going 3-4 months between drainage. CT chest in July 2024 showed decreased small right pleural effusion   Glenford Bayley, NP 01/16/2023

## 2023-01-16 NOTE — Patient Instructions (Addendum)
Recommendations: Start Augmentin and prednisone as directed Continue Mucinex twice daily Continue Flonase nasal spray When back to your baseline ok to decrease Breztri Aerosphere 1 puff twice daily or try using every other day Get RSV vaccine through your pharmacy when better and covid/flu vaccines this fall   Follow-up: October with Dr. Judeth Horn

## 2023-01-16 NOTE — Assessment & Plan Note (Addendum)
-   Hx recurrent right pleural effusion. Cytology negative. Serial thoracentesis as needed. Going 3-4 months between drainage. CT chest in July 2024 showed decreased small right pleural effusion

## 2023-01-16 NOTE — Telephone Encounter (Signed)
Patient scheduled as an acute visit today, 01/16/2023 at 230pm with Buelah Manis NP

## 2023-01-16 NOTE — Assessment & Plan Note (Addendum)
-   Overall his asthma symptoms have been well controlled over the last several months on [employer]. He is having less flare ups and rarely requires SABA. He developed acute URI/bronchitis symptoms 3 days ago. Cough is congested, non-productive. No recent sick contacts or fevers. RX Augmentin 1 tablet twice daily x 7 days and prednisone taper sent today. He is wondering if he needs to continue to use [place] everyday. We discussed tapering dose or using as needed. Recommend when he recovers from URI symptoms to try using Breztri Aerosphere 1 puff every 12 hours. Continue mucinex 600mg  twice daily as needed for cough/congestion. Recommend patient get one time RSV vaccine through his pharmacy and annual covid/influenza vaccine this fall. Follow-up if symptoms do not improve.

## 2023-01-22 MED ORDER — AMOXICILLIN-POT CLAVULANATE 875-125 MG PO TABS: 1.0000 | ORAL_TABLET | Freq: Two times a day (BID) | ORAL | 0 refills | Status: DC

## 2023-01-22 MED ORDER — PREDNISONE 10 MG PO TABS: ORAL_TABLET | ORAL | 0 refills | Status: DC

## 2023-01-22 NOTE — Telephone Encounter (Signed)
Have him resume Breztri if he stopped taking Refill prednisone taper  Extend Augmentin 1 tablet twice daily for additional 3 days  Make sure he is taking mucinex 600-1200mg  twice daily

## 2023-01-22 NOTE — Telephone Encounter (Signed)
Have him resume Breztri if he stopped taking Refill prednisone taper  Extend Augmentin 1 tablet twice daily for additional 3 days  Make sure he is taking mucinex 600-1200mg  twice daily   I sent in RX

## 2023-01-26 DIAGNOSIS — C787 Secondary malignant neoplasm of liver and intrahepatic bile duct: Secondary | ICD-10-CM | POA: Diagnosis not present

## 2023-01-29 DIAGNOSIS — C182 Malignant neoplasm of ascending colon: Secondary | ICD-10-CM | POA: Diagnosis not present

## 2023-01-29 DIAGNOSIS — C787 Secondary malignant neoplasm of liver and intrahepatic bile duct: Secondary | ICD-10-CM | POA: Diagnosis not present

## 2023-01-30 DIAGNOSIS — C787 Secondary malignant neoplasm of liver and intrahepatic bile duct: Secondary | ICD-10-CM | POA: Diagnosis not present

## 2023-01-30 DIAGNOSIS — J449 Chronic obstructive pulmonary disease, unspecified: Secondary | ICD-10-CM | POA: Diagnosis not present

## 2023-01-31 DIAGNOSIS — C182 Malignant neoplasm of ascending colon: Secondary | ICD-10-CM | POA: Diagnosis not present

## 2023-01-31 DIAGNOSIS — C787 Secondary malignant neoplasm of liver and intrahepatic bile duct: Secondary | ICD-10-CM | POA: Diagnosis not present

## 2023-01-31 DIAGNOSIS — Z510 Encounter for antineoplastic radiation therapy: Secondary | ICD-10-CM | POA: Diagnosis not present

## 2023-02-01 DIAGNOSIS — C787 Secondary malignant neoplasm of liver and intrahepatic bile duct: Secondary | ICD-10-CM | POA: Diagnosis not present

## 2023-02-02 DIAGNOSIS — C787 Secondary malignant neoplasm of liver and intrahepatic bile duct: Secondary | ICD-10-CM | POA: Diagnosis not present

## 2023-02-02 NOTE — Telephone Encounter (Signed)
He should come in for a CXR next week if not better. Can you please send in Levaquin 500mg  daily x 5 days. Make sure he is taking mucinex. Cough deep breathing exercise. Use nebulizer q 6 hours and flutter valve if he has one

## 2023-02-05 DIAGNOSIS — H903 Sensorineural hearing loss, bilateral: Secondary | ICD-10-CM | POA: Diagnosis not present

## 2023-02-07 ENCOUNTER — Ambulatory Visit: Payer: Medicare PPO | Admitting: Internal Medicine

## 2023-02-07 ENCOUNTER — Encounter: Payer: Self-pay | Admitting: Internal Medicine

## 2023-02-07 ENCOUNTER — Ambulatory Visit (INDEPENDENT_AMBULATORY_CARE_PROVIDER_SITE_OTHER): Payer: Medicare PPO

## 2023-02-07 VITALS — BP 110/54 | HR 71 | Ht 70.0 in | Wt 228.6 lb

## 2023-02-07 DIAGNOSIS — C787 Secondary malignant neoplasm of liver and intrahepatic bile duct: Secondary | ICD-10-CM | POA: Diagnosis not present

## 2023-02-07 DIAGNOSIS — C189 Malignant neoplasm of colon, unspecified: Secondary | ICD-10-CM

## 2023-02-07 DIAGNOSIS — J90 Pleural effusion, not elsewhere classified: Secondary | ICD-10-CM | POA: Diagnosis not present

## 2023-02-07 DIAGNOSIS — D860 Sarcoidosis of lung: Secondary | ICD-10-CM

## 2023-02-07 DIAGNOSIS — R051 Acute cough: Secondary | ICD-10-CM | POA: Diagnosis not present

## 2023-02-07 DIAGNOSIS — R058 Other specified cough: Secondary | ICD-10-CM

## 2023-02-07 DIAGNOSIS — R059 Cough, unspecified: Secondary | ICD-10-CM | POA: Diagnosis not present

## 2023-02-07 MED ORDER — HYDROCOD POLI-CHLORPHE POLI ER 10-8 MG/5ML PO SUER: 5.0000 mL | Freq: Two times a day (BID) | ORAL | 0 refills | Status: DC | PRN

## 2023-02-07 NOTE — Progress Notes (Signed)
Luis [name]    [id number]    08-06-1940  Primary Care Physician:Hamrick, Durward Fortes, MD Date of Appointment: 02/07/2023 Established Patient Visit  Chief complaint:   Chief Complaint  Patient presents with   Acute Visit    Pt states he congestion is worse after he finished his prednisone. Has a bad cough when he converses. Doesn't smoke.      HPI: Luis [name] is a 82 y.o. is a 82 y.o. with history of tobacco use disorder quit 2016, recurrent right pleural effusion, asthma, pulmonary sarcoidosis. 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 Records reviewed from [facility] oncology - plan is for SBRT for progression on most recent CT Chest.   Interval Updates: Seen primarily with Dr. Judeth Horn. Here for an acute visit. Has high symptom burden and frequent exacerbations requiring predisone and antibiotics.  Has had two courses of antibiotics and prednisone.   Since January has needed predisone twice.   Absolute eosinophil count - 800 in Dec 2021.   Current therapy - breztri 2 puffs twice daily, albuterol nebulizer.   No fevers or chills appetite is ok. Has post nasal drainage.   Pulmonary sarcoidosis - diagnosed by bronchoscopy in the 1990s. Treated with prednisone. Has been clinically quiescent.  CT Chest in July 2024 at [facility].  Unchanged fibrotic lung disease, consistent with known sarcoidosis. No  suspicious pulmonary nodule.  2.  Slightly decreased small right pleural effusion.  3.  Mid to distal esophageal wall thickening, which can be seen in  esophagitis.  4. Liver is not well evaluated on noncontrast images, better evaluated on  10/09/2022 MRI abdomen.    I have reviewed the patient's family social and past medical history and updated as appropriate.   Past Medical History:  Diagnosis Date   Adenocarcinoma of cecum (HCC) 06/21/2017   Arthritis    "mid back; hands; knees" (02/24/2015)   Asthmatic bronchitis with acute  exacerbation 06/27/2021   Basal cell carcinoma    left shoulder; mid chest; right eyelid (02/24/2015)   CAD (coronary artery disease)    a. 08/2014 Cath/PCI: LM nl, LAD 30p, D1 95 (2.25x12 Resolute Integrity DES), LCX small, RI 100 (attempted PCI) - branches fill via L->L collats, RCA dominant, nl, RPDA/PLA nl, EF 60^. b. 02/24/2015 PCI CTO of Ramus DES x2.   Chronic bronchitis (HCC)    hx   Diverticulosis 05/2005   Elevated lipase    Fuchs' corneal dystrophy    History of adenomatous polyp of colon 05/2005   8 mm adenoma   History of blood transfusion    "related to some of my surgeries"   Hyperlipidemia    Hypertension    Iron deficiency anemia due to chronic blood loss 06/21/2017   Pneumonia    Prostate cancer (HCC) 2011   S/P seed implant   Sarcoidosis    Thrombocytopenia (HCC)    a. Noted on prior labs, unclear of what w/u done.   Trifascicular block     Past Surgical History:  Procedure Laterality Date   BASAL CELL CARCINOMA EXCISION Left    shoulder   CARDIAC CATHETERIZATION N/A 02/24/2015   Procedure: Coronary/Bypass Graft CTO Intervention;  Surgeon: Corky Crafts, MD;  Location: [facility] INVASIVE CV LAB;  Service: Cardiovascular;  Laterality: N/A;   CARDIAC CATHETERIZATION  02/24/2015   Procedure: Coronary/Graft Atherectomy;  Surgeon: Corky Crafts, MD;  Location: [facility] INVASIVE CV LAB;  Service: Cardiovascular;;   CATARACT  EXTRACTION W/ INTRAOCULAR LENS  IMPLANT, BILATERAL Bilateral ~ 2011-2012   COLONOSCOPY W/ POLYPECTOMY  06/08/2005 and 10/18/2010   8 mm adenoma 2006, none 2012. Diverticulosis and internal hemorrhoids.   CORNEAL TRANSPLANT Bilateral ~ 2011-2012   "@ same time as cataract OR"   CORONARY ANGIOPLASTY WITH STENT PLACEMENT  08/2014; 02/24/2015   "1 stent + 1 stent"   CORONARY STENT INTERVENTION N/A 01/02/2019   Procedure: CORONARY STENT INTERVENTION;  Surgeon: Corky Crafts, MD;  Location: [facility] INVASIVE CV LAB;  Service: Cardiovascular;  Laterality:  N/A;   EYE SURGERY     FINGER SURGERY Right 2014   "reattached middle finger"   INSERTION PROSTATE RADIATION SEED  ~ 2011   IR IMAGING GUIDED PORT INSERTION  03/04/2019   IR RADIOLOGIST EVAL & MGMT  05/26/2020   IR US GUIDE BX ASP/DRAIN  03/04/2019   LAPAROSCOPIC CHOLECYSTECTOMY  2015   LAPAROSCOPIC PARTIAL COLECTOMY N/A 07/26/2017   Procedure: LAPAROSCOPIC PARTIAL COLECTOMY, EXCISION SCROTAL CYST;  Surgeon: Gaynelle Adu, MD;  Location: [facility] ORS;  Service: General;  Laterality: N/A;   LEFT HEART CATH AND CORONARY ANGIOGRAPHY N/A 01/02/2019   Procedure: LEFT HEART CATH AND CORONARY ANGIOGRAPHY;  Surgeon: Corky Crafts, MD;  Location[facility] INVASIVE CV LAB;  Service: Cardiovascular;  Laterality: N/A;   LEFT HEART CATHETERIZATION WITH CORONARY ANGIOGRAM N/A 08/12/2014   Procedure: LEFT HEART CATHETERIZATION WITH CORONARY ANGIOGRAM;  Surgeon: Micheline Chapman, MD;  Location: [facility] CATH LAB;  Service: Cardiovascular;  Laterality: N/A;   LYMPH NODE BIOPSY     mid chest   MOHS SURGERY Right ~ 2011   "eyelid; for basal cell"   RADIOLOGY WITH ANESTHESIA N/A 06/30/2020   Procedure: IR WITH ANESTHESIA MICROWAVE ABLATION;  Surgeon: Gilmer Mor, DO;  Location: [facility] ORS;  Service: Anesthesiology;  Laterality: N/A;   RIGHT/LEFT HEART CATH AND CORONARY ANGIOGRAPHY N/A 04/25/2022   Procedure: RIGHT/LEFT HEART CATH AND CORONARY ANGIOGRAPHY;  Surgeon: Corky Crafts, MD;  Location: [facility] INVASIVE CV LAB;  Service: Cardiovascular;  Laterality: N/A;   THORACENTESIS N/A 02/11/2021   Procedure: Alanson Puls;  Surgeon: Karren Burly, MD;  Location: [facility] ENDOSCOPY;  Service: Pulmonary;  Laterality: N/A;   THORACENTESIS Right 04/04/2021   Procedure: THORACENTESIS;  Surgeon: Lorin Glass, MD;  Location: [facility] ENDOSCOPY;  Service: Pulmonary;  Laterality: Right;   THORACENTESIS Right 08/16/2021   Procedure: THORACENTESIS;  Surgeon: Cheri Fowler, MD;  Location: [facility] ENDOSCOPY;  Service: Pulmonary;  Laterality: Right;    THORACENTESIS Right 11/29/2021   Procedure: THORACENTESIS;  Surgeon: Martina Sinner, MD;  Location: [facility] ENDOSCOPY;  Service: Pulmonary;  Laterality: Right;   THORACENTESIS N/A 03/17/2022   Procedure: Alanson Puls;  Surgeon: Karren Burly, MD;  Location: [facility] ENDOSCOPY;  Service: Pulmonary;  Laterality: N/A;   THORACENTESIS N/A 07/14/2022   Procedure: Alanson Puls;  Surgeon: Josephine Igo, DO;  Location: [facility] ENDOSCOPY;  Service: Pulmonary;  Laterality: N/A;   THORACENTESIS N/A 10/11/2022   Procedure: Alanson Puls;  Surgeon: Cheri Fowler, MD;  Location: [facility] ENDOSCOPY;  Service: Pulmonary;  Laterality: N/A;   THOROCOTOMY WITH LOBECTOMY Right ~ 1991   partial removal right lung   TONSILLECTOMY  ~ 1950    Family History  Problem Relation Age of Onset   Heart disease Father 58       Died from "hardening of the arteries" age 59   Coronary artery disease Sister 78   Heart attack Sister    Colon cancer Neg Hx    Throat cancer Neg Hx  Pancreatic cancer Neg Hx    Prostate cancer Neg Hx    Stroke Neg Hx    Hypertension Neg Hx     Social History   Occupational History   Occupation: Unemployed  Tobacco Use   Smoking status: Light Smoker    Current packs/day: 0.00    Types: Cigars, Cigarettes    Last attempt to quit: 02/24/1956    Years since quitting: 67.0   Smokeless tobacco: Never   Tobacco comments:    02/24/2015 "quit cigarettes ~ 2000 ago but smokes cigars about once every two months. Updated 01/16/2023 am  Vaping Use   Vaping status: Never Used  Substance and Sexual Activity   Alcohol use: Yes    Alcohol/week: 2.0 standard drinks of alcohol    Types: 1 Glasses of wine, 1 Standard drinks or equivalent per week    Comment: Drinks maybe 1-2 drinks a week or less   Drug use: No   Sexual activity: Not on file     Physical Exam: Blood pressure (!) 110/54, pulse 71, height 5\' 10"  (1.778 m), weight 228 lb 9.6 oz (103.7 kg), SpO2 96%.  Gen:      Breathy voice, frequent  cough, upper airway wheezing, frequent throat clearing ENT:  no thrush, no cobblestoning, no nasal debrisno nasal polyps, mucus membranes moist Lungs:    No increased respiratory effort, symmetric chest wall excursion, clear to auscultation bilaterally, no wheezes or crackles CV:         Regular rate and rhythm; no murmurs, rubs, or gallops.  No pedal edema   Data Reviewed: Imaging: I have personally reviewed the Chest xray today shows no lobar consolidation.   PFTs:     Latest Ref Rng & Units 03/10/2020    3:36 PM 08/30/2017    3:59 PM  PFT Results  FVC-Pre L 2.94  P 3.14   FVC-Predicted Pre % 68  P 72   FVC-Post L 2.90  P 3.18   FVC-Predicted Post % 67  P 73   Pre FEV1/FVC % % 66  P 70   Post FEV1/FCV % % 68  P 73   FEV1-Pre L 1.93  P 2.19   FEV1-Predicted Pre % 62  P 69   FEV1-Post L 1.96  P 2.31   DLCO uncorrected ml/min/mmHg 19.10  P 17.73   DLCO UNC% % 75  P 52   DLCO corrected ml/min/mmHg 21.40  P 20.68   DLCO COR %Predicted % 84  P 61   DLVA Predicted % 118  P 98   TLC L 5.02  P 4.99   TLC % Predicted % 69  P 68   RV % Predicted % 76  P 71     P Preliminary result   I have personally reviewed the patient's PFTs and moderate airflow limitation FEV1 63% of predicted with mixed restriction to ventilation.   Labs: Elevated absolute eosinophil count.  Lab Results  Component Value Date   WBC 4.5 08/01/2021   HGB 11.6 (L) 04/25/2022   HGB 11.9 (L) 04/25/2022   HCT 34.0 (L) 04/25/2022   HCT 35.0 (L) 04/25/2022   MCV 84 08/01/2021   PLT 171 08/01/2021   Lab Results  Component Value Date   NA 137 04/25/2022   NA 137 04/25/2022   K 3.8 04/25/2022   K 3.8 04/25/2022   CL 99 09/09/2021   CO2 24 09/09/2021     Immunization status: Immunization History  Administered Date(s) Administered   DTaP /  IPV 09/25/2012   Fluad Quad(high Dose 65+) 02/19/2019, 03/14/2022   Influenza Split 03/27/2014   Influenza, High Dose Seasonal PF 03/27/2014, 01/27/2017, 02/22/2018,  03/12/2021   Influenza-Unspecified 02/24/2017, 03/10/2020   PFIZER Comirnaty(Gray Top)Covid-19 Tri-Sucrose Vaccine 07/21/2019   PFIZER(Purple Top)SARS-COV-2 Vaccination 07/11/2019, 07/21/2019, 01/26/2020, 10/26/2020   Pneumococcal Conjugate-13 03/27/2014   Pneumococcal Polysaccharide-23 11/10/2009   Pneumococcal-Unspecified 03/12/2014   Tdap 03/24/2011, 09/25/2012, 05/28/2021   Zoster Recombinant(Shingrix) 09/01/2018, 02/19/2019   Zoster, Live 03/21/2010    External Records Personally Reviewed: [facility], pulmonary  Assessment:  Severe persistent asthma COPD overlap syndrome, not well controlled with frequent Oral steroid use Recurrent right pleural effusion Pulmonary Sarcoidosis Peripheral eosinophilia Chronic rhinitis Adenocarcinoma of the colon with hepatic metastases - currently on SBRT  Plan/Recommendations:  Chest xray obtained today shows stable right sided atelectasis without major pleural effusion reaccumulation.  No pneumonia. I think your cough is just residual from the recent infection.  I am prescribing some cough syrup to take over the next couple weeks to help with cough - take it at night first because side effect is making you sleepy.  Also get some saline over the counter to help with post nasal drainage.   Hope you get well soon!  Return to Care: Return in about 3 months (around 05/10/2023), or Dr Judeth Horn.   Durel Salts, MD Pulmonary and Critical Care Medicine [facility] [phone]

## 2023-02-07 NOTE — Patient Instructions (Addendum)
Follow up with Dr. Judeth Horn or APP in 3 months  Your chest xray shows no worsening of the fluid. No pneumonia. I think your cough is just residual from the recent infection.  I am prescribing some cough syrup to take over the next couple weeks to help with cough - take it at night first because side effect is making you sleepy.  Also get some saline over the counter to help with post nasal drainage.   Hope you get well soon!

## 2023-02-09 DIAGNOSIS — D2239 Melanocytic nevi of other parts of face: Secondary | ICD-10-CM | POA: Diagnosis not present

## 2023-02-09 DIAGNOSIS — L820 Inflamed seborrheic keratosis: Secondary | ICD-10-CM | POA: Diagnosis not present

## 2023-02-09 DIAGNOSIS — B351 Tinea unguium: Secondary | ICD-10-CM | POA: Diagnosis not present

## 2023-02-09 DIAGNOSIS — D485 Neoplasm of uncertain behavior of skin: Secondary | ICD-10-CM | POA: Diagnosis not present

## 2023-02-09 DIAGNOSIS — D225 Melanocytic nevi of trunk: Secondary | ICD-10-CM | POA: Diagnosis not present

## 2023-02-10 ENCOUNTER — Other Ambulatory Visit: Payer: Self-pay | Admitting: Nurse Practitioner

## 2023-02-10 DIAGNOSIS — J441 Chronic obstructive pulmonary disease with (acute) exacerbation: Secondary | ICD-10-CM

## 2023-02-15 NOTE — Progress Notes (Addendum)
Cardiology Office Note:    Date:  02/16/2023  ID:  Luis [name], DOB July 11, 1940, MRN [id number] PCP: Ailene Ravel, MD  [facility] Providers Cardiologist:  Lance Muss, MD       Patient Profile:      Coronary artery disease  S/p 2.25 x 12 mm DES to the D1 in 08/2014 S/p CTO PCI of the RI with DES x 2 in 02/2015 S/p 3 x 24 DES to pLAD, POBA to D1 in 12/2018 Myoview 02/10/2022: No ischemia or infarction, EF 63 LHC 04/25/2022: LAD stent patent, diagonal stent patent, diagonal angioplasty site patent, apical LAD 70 (too small for PCI); RI stent patent TTE 09/15/2022: EF 60-65, no RWMA, mild LVH, GR 1 DD, normal RVSF, mildly dilated ascending aorta (40 mm), RAP 3 Monitor 02/2022: NSR with frequent PACs, rare PVCs Carotid artery disease Carotid US 05/22/2022: R ICA 100; L ICA 1-39 Hypertension Hyperlipidemia Hx of Pulmonary Sarcoidosis Recurrent R Pleural Effusion s/p thoracentesis in 08/2021 Asthma/Chronic Obstructive Pulmonary Disease  Pulm: Dr. Judeth Horn Prostate CA Metastatic Colon CA (liver involvement)        History of Present Illness:   Luis [name] is a 82 y.o. male who returns for follow up of CAD. He was last seen in clinic by Jari Favre, PA-C in 08/2022.  Ranexa was discontinued at that time to reduce medication burden. He is here.  Overall, his breathing is stable.  He saw pulmonology recently and was placed on cough medicine for postnasal drip.  His cough has improved.  He is no longer on prednisone.  He has not had chest discomfort to suggest angina.  He has not had orthopnea, leg edema or syncope.  He has not had melena or hematochezia.  He has noted fatigue that did not improve like it usually does after undergoing radiation therapy.  He follows with oncology at [facility].    ROS:  See HPI    Studies Reviewed:        Risk Assessment/Calculations:             Physical Exam:   VS:  BP (!) 111/50   Pulse 66   Ht 5' 10.5" (1.791 m)   Wt 230 lb 3.2 oz  (104.4 kg)   SpO2 95%   BMI 32.56 kg/m    Wt Readings from Last 3 Encounters:  02/16/23 230 lb 3.2 oz (104.4 kg)  02/07/23 228 lb 9.6 oz (103.7 kg)  01/16/23 230 lb 6.4 oz (104.5 kg)    Constitutional:      Appearance: Healthy appearance. Not in distress.  Neck:     Vascular: JVD normal.  Pulmonary:     Breath sounds: Normal breath sounds. No wheezing. No rales.  Cardiovascular:     Normal rate. Regular rhythm.     Murmurs: There is no murmur.  Edema:    Peripheral edema absent.  Abdominal:     Palpations: Abdomen is soft.         Assessment and Plan:  1.  Coronary artery disease History of DES to the D1 in 2016 and DES x 2 to the RI and 2016, DES to the LAD and balloon angioplasty to D1 in 2020.  Cardiac catheterization in November 2023 with stents.  Apical LAD was 70% and too small for PCI.  He has been managed medically.  Oncology stopped his amlodipine secondary to low blood pressure.  He is not having recurrent anginal symptoms.  His anginal equivalent was significant shortness of  breath. -Continue Crestor 20 mg daily, Plavix 75 mg daily, Toprol-XL 50 mg daily Ranexa 5 mg twice daily -Follow-up 6 months  2.  Hypertension Controlled.  Continue Toprol-XL 50 mg daily  3.  Hyperlipidemia LDL above goal in April 2024 at 59.  Given metastatic colon cancer with liver involvement, I would be hesitant to advance his therapy too much.  Obtain direct LDL today, CMET.  4.  Carotid artery disease Chronically occluded right ICA.  Last ultrasound December 2023.  Continue annual surveillance.  5.  Metastatic colon cancer Continue follow-up with oncology at [facility]  6.  COPD He has a history of pulmonary sarcoid which has resolved.  He has undergone multiple thoracenteses for recurrent right pleural effusion which is likely inflammatory related to his liver involvement from colon cancer.  He is followed by pulmonology.  7.  Fatigue Unclear etiology.  Obtain c-Met, CBC, TSH.  Follow-up  with primary care.  Return sooner if fatigue worsens with associated anginal symptoms.           Dispo:  Return in about 6 months (around 08/16/2023) for Routine Follow Up, w/ Tereso Newcomer, PA-C.  Signed, Tereso Newcomer, PA-C

## 2023-02-16 ENCOUNTER — Ambulatory Visit: Payer: Medicare PPO | Attending: Physician Assistant | Admitting: Physician Assistant

## 2023-02-16 ENCOUNTER — Encounter: Payer: Self-pay | Admitting: Physician Assistant

## 2023-02-16 VITALS — BP 111/50 | HR 66 | Ht 70.5 in | Wt 230.2 lb

## 2023-02-16 DIAGNOSIS — I2510 Atherosclerotic heart disease of native coronary artery without angina pectoris: Secondary | ICD-10-CM | POA: Diagnosis not present

## 2023-02-16 DIAGNOSIS — I6523 Occlusion and stenosis of bilateral carotid arteries: Secondary | ICD-10-CM

## 2023-02-16 DIAGNOSIS — E7800 Pure hypercholesterolemia, unspecified: Secondary | ICD-10-CM | POA: Diagnosis not present

## 2023-02-16 DIAGNOSIS — J449 Chronic obstructive pulmonary disease, unspecified: Secondary | ICD-10-CM

## 2023-02-16 DIAGNOSIS — R5383 Other fatigue: Secondary | ICD-10-CM

## 2023-02-16 DIAGNOSIS — C801 Malignant (primary) neoplasm, unspecified: Secondary | ICD-10-CM

## 2023-02-16 DIAGNOSIS — I10 Essential (primary) hypertension: Secondary | ICD-10-CM

## 2023-02-16 NOTE — Patient Instructions (Signed)
Medication Instructions:  Your physician recommends that you continue on your current medications as directed. Please refer to the Current Medication list given to you today.  *If you need a refill on your cardiac medications before your next appointment, please call your pharmacy*   Lab Work: TODAY:  CMET, DIRECT LDL, CBC, & TSH  If you have labs (blood work) drawn today and your tests are completely normal, you will receive your results only by: MyChart Message (if you have MyChart) OR A paper copy in the mail If you have any lab test that is abnormal or we need to change your treatment, we will call you to review the results.   Testing/Procedures: None ordered   Follow-Up: At [facility], you and your health needs are our priority.  As part of our continuing mission to provide you with exceptional heart care, we have created designated Provider Care Teams.  These Care Teams include your primary Cardiologist (physician) and Advanced Practice Providers (APPs -  Physician Assistants and Nurse Practitioners) who all work together to provide you with the care you need, when you need it.  We recommend signing up for the patient portal called "MyChart".  Sign up information is provided on this After Visit Summary.  MyChart is used to connect with patients for Virtual Visits (Telemedicine).  Patients are able to view lab/test results, encounter notes, upcoming appointments, etc.  Non-urgent messages can be sent to your provider as well.   To learn more about what you can do with MyChart, go to [email or web address].    Your next appointment:   6 month(s)  Provider:   Tereso Newcomer, PA-C         Other Instructions

## 2023-02-17 LAB — COMPREHENSIVE METABOLIC PANEL
ALT: 27 IU/L (ref 0–44)
AST: 42 IU/L — ABNORMAL HIGH (ref 0–40)
Albumin: 3.7 g/dL (ref 3.7–4.7)
Alkaline Phosphatase: 234 IU/L — ABNORMAL HIGH (ref 44–121)
BUN/Creatinine Ratio: 15 (ref 10–24)
BUN: 12 mg/dL (ref 8–27)
Bilirubin Total: 0.5 mg/dL (ref 0.0–1.2)
CO2: 28 mmol/L (ref 20–29)
Calcium: 8.9 mg/dL (ref 8.6–10.2)
Chloride: 97 mmol/L (ref 96–106)
Creatinine, Ser: 0.82 mg/dL (ref 0.76–1.27)
Globulin, Total: 2.2 g/dL (ref 1.5–4.5)
Glucose: 155 mg/dL — ABNORMAL HIGH (ref 70–99)
Potassium: 4.5 mmol/L (ref 3.5–5.2)
Sodium: 136 mmol/L (ref 134–144)
Total Protein: 5.9 g/dL — ABNORMAL LOW (ref 6.0–8.5)
eGFR: 88 mL/min/{1.73_m2} (ref >59)

## 2023-02-17 LAB — CBC
Hematocrit: 39.9 % (ref 37.5–51.0)
Hemoglobin: 13.4 g/dL (ref 13.0–17.7)
MCH: 30.5 pg (ref 26.6–33.0)
MCHC: 33.6 g/dL (ref 31.5–35.7)
MCV: 91 fL (ref 79–97)
Platelets: 178 10*3/uL (ref 150–450)
RBC: 4.39 x10E6/uL (ref 4.14–5.80)
RDW: 16.1 % — ABNORMAL HIGH (ref 11.6–15.4)
WBC: 5 10*3/uL (ref 3.4–10.8)

## 2023-02-17 LAB — TSH: TSH: 6.77 u[IU]/mL — ABNORMAL HIGH (ref 0.450–4.500)

## 2023-02-17 LAB — LDL CHOLESTEROL, DIRECT: LDL Direct: 79 mg/dL (ref 0–99)

## 2023-02-26 ENCOUNTER — Inpatient Hospital Stay: Payer: Medicare PPO | Attending: Oncology | Admitting: Oncology

## 2023-02-26 VITALS — BP 138/65 | HR 61 | Temp 98.1°F | Resp 18 | Ht 70.0 in | Wt 230.0 lb

## 2023-02-26 DIAGNOSIS — C787 Secondary malignant neoplasm of liver and intrahepatic bile duct: Secondary | ICD-10-CM | POA: Diagnosis not present

## 2023-02-26 DIAGNOSIS — C180 Malignant neoplasm of cecum: Secondary | ICD-10-CM | POA: Diagnosis not present

## 2023-02-26 NOTE — Progress Notes (Signed)
[facility] OFFICE PROGRESS NOTE   Diagnosis: Colon cancer  INTERVAL HISTORY:   Luis [name] returns as scheduled.  He is currently maintained off of treatment for colon cancer.  He completed SBRT to a recurrent right liver lesion last month.  He feels well at present.  He continues follow-up at [facility] for flushing of the Port-A-Cath and hepatic infusion pump.  He is scheduled for restaging CTs in October.  Objective:  Vital signs in last 24 hours:  Blood pressure 138/65, pulse 61, temperature 98.1 F (36.7 C), temperature source Oral, resp. rate 18, height 5\' 10"  (1.778 m), weight 230 lb (104.3 kg), SpO2 94%.    Lymphatics: No cervical, supraclavicular, axillary, or inguinal nodes Resp: Lungs clear bilaterally Cardio: Regular rate and rhythm GI: No hepatosplenomegaly, no mass, nontender, left abdomen infusion pump Vascular: No leg edema    Portacath/PICC-without erythema  Lab Results:  Lab Results  Component Value Date   WBC 5.0 02/16/2023   HGB 13.4 02/16/2023   HCT 39.9 02/16/2023   MCV 91 02/16/2023   PLT 178 02/16/2023   NEUTROABS 4.9 05/29/2021    CMP  Lab Results  Component Value Date   NA 136 02/16/2023   K 4.5 02/16/2023   CL 97 02/16/2023   CO2 28 02/16/2023   GLUCOSE 155 (H) 02/16/2023   BUN 12 02/16/2023   CREATININE 0.82 02/16/2023   CALCIUM 8.9 02/16/2023   PROT 5.9 (L) 02/16/2023   ALBUMIN 3.7 02/16/2023   AST 42 (H) 02/16/2023   ALT 27 02/16/2023   ALKPHOS 234 (H) 02/16/2023   BILITOT 0.5 02/16/2023   GFRNONAA >60 05/29/2021   GFRAA >60 03/10/2020    Lab Results  Component Value Date   CEA1 2.10 04/14/2020   CEA 1.3 06/20/2017     Medications: I have reviewed the patient's current medications.   Assessment/Plan: Adenocarcinoma of the cecum, stage IIA (T3N0), status post a right colectomy 07/26/2017 0/16 lymph nodes positive, no lymphovascular invasion, perineural invasion present MSI-stable, no loss of mismatch repair  protein expression Surveillance colonoscopy 07/26/2018-no polyps found Elevated CEA June 2020, persistently elevated August 2020 CTs 02/07/2019- bilateral liver metastases, no extrahepatic metastatic disease, changes of sarcoidosis in the chest Biopsy liver lesion 03/04/2019-metastatic adenocarcinoma, with morphology consistent with metastatic colonic adenocarcinoma, RAS WT Cycle 1 FOLFOX 03/12/2019 Cycle 2 FOLFOX 03/26/2019, oxaliplatin dose reduced secondary to thrombocytopenia Cycle 3 FOLFOX 04/09/2019, oxaliplatin further dose reduced and aspirin held secondary to thrombocytopenia Cycle 4 FOLFOX 04/23/2019 Cycle 5 FOLFOX 05/07/2019 CTs 05/23/2019-decrease in liver lesions.  No new or progressive findings. Cycle 6 FOLFOX 05/27/2019 Cycle 7 FOLFOX 06/14/2019 Cycle 8 FOLFOX 07/02/2019-Udenyca held Cycle 9 FOLFOX 07/16/2019-Udenyca held Cycle 10 FOLFOX 07/30/2019-Udenyca held CT abdomen/pelvis 08/13/2019-mild improvement in previously noted liver lesions MRI abdomen 08/13/2019-3 liver lesions  Cycle 1 FOLFIRI/Avastin 09/17/2019 Cycle 2 FOLFIRI/Avastin 10/01/2019 Cycle 3 FOLFIRI/Avastin 10/15/2019 Cycle 4 FOLFIRI/Avastin 11/05/2019 Cycle 5 FOLFIRI/Avastin 11/19/2019 CTs 11/27/2019-stable liver lesions; no new liver lesions.  No other evidence of metastatic disease in the abdomen or pelvis. Cycle 1 FOLFIRI/Panitumumab 12/03/2019 Cycle 2 FOLFIRI/Panitumumab 12/17/2019 (Panitumumab dose reduced due to rash) Cycle 3 FOLFIRI/Panitumumab 01/01/2020 Cycle 4 FOLFIRI/Panitumumab 01/14/2020 Cycle 5 FOLFIRI/Panitumumab 01/28/2020 CT abdomen/pelvis 02/06/2020-decreased size of right hepatic lesions, subtle lesion in the lateral left hepatic lobe-not clearly identified, no evidence of disease progression Cycle 6 FOLFIRI/Panitumumab 02/11/2020 Cycle 7 FOLFIRI/Panitumumab 03/10/2020 Cycle 8 FOLFIRI/Panitumumab 03/26/2020 Cycle 9 FOLFIRI/Panitumumab 04/14/2020 CT abdomen/pelvis 04/26/2020-mild enlargement of 2 liver lesions on  initial report, repeat review with  radiology consistent with stable disease Cycle 10 FOLFIRI/Panitumumab 04/29/2020 MRI liver 05/19/2020-right hepatic lobe lesions diminished in size when compared to the study of March 2021.  Lesion in hepatic subsegment VI with very limited assessment as well due to adjacent bowel. 05/20/2020 referred to interventional radiology to consider ablation of liver lesions CT abdomen 06/30/2020 prior to planned ablation procedure, enlargement of segment 7 liver lesion and segment 6 liver lesion.  Interval recurrence of segment 2 lesion measuring 4.5 cm, ablation aborted 07/23/2020-diagnostic laparoscopy, robotic assisted insertion of hepatic infusion pump, portal lymphadenectomy-granulomatous inflammation compatible with sarcoidosis, core tumor biopsy segment 6, infiltrating adenocarcinoma 08/02/2020-cycle 1 FUDR at 50% dose 08/16/2020-FOLFIRI/panitumumab CTs 10/25/2020-significantly decreased size of hepatic metastases 10/25/2020 cycle 6 FOLFIRI/Panitumumab, FUDR # 4 11/09/2020-cycle 7 FOLFIRI/panitumumab 01/06/2021-diagnostic laparoscopy, partial hepatectomy-segment 2, segment 6, microwave ablation segment 7-3 tumors identified, segment 2 and 6 tumors resected-adenocarcinoma with negative margins, segment 2 lesion with less than 1 mm hepatic parenchymal margin 03/28/2021-CTs with slight increase in right pleural effusion, CEA 1.5 05/16/2021-increasing ill-defined lesion in segment 7, MRI-posttreatment change of the liver with heterogenous hypoenhancement adjacent to hepatic segment 7 ablation site suspicious for local recurrence January 2023-SBRT to segment 7 ablation cavity 08/08/2021-stable posttreatment changes in liver, segment 7 ablation cavity unchanged, no new metastatic disease, increasing pleural effusion 10/17/2021, CT-interval increase in left hepatic lobe segment 3 liver lesion measuring up to 2 cm 11/17/2021-completed 60 Gray in 8 fractions to segment 3 liver  lesion 01/02/2022-CTs-stable 03/17/2022-thoracentesis right pleural effusion 03/27/2022-CTs-increased size of hypoattenuating lesion at the deep margin of the previously treated lesion right hepatic lobe 04/05/2019-MRI with increased size of a heterogenous lesion at the deep margin of a previously treated right hepatic lobe lesion, small right pleural effusion 04/17/2022-5-FU and FUDR and panitumumab 05/07/2022 FUDR No. 1 at 100% plus 5-FU 07/10/2022 FUDR No. 4 at 100% 07/24/2022 glycerin flush plus 5-FU plus panitumumab 08/21/2022 CTs-slight decrease conspicuity of enhancing portion at the deep margin of the right hepatic lobe treatment cavity, new subcentimeter hypodensity in the medial left hepatic lobe, decreased subcentimeter cystic lesion in the left hepatic lobe, stable right pleural effusion CEA elevated 01/01/2023 01/08/2023-CTs-increased hypoenhancing area at the deep margin of right hepatic lobe resection cavity SBRT to right liver lesion beginning 01/26/2023  History of colon polyps Microcytic anemia January 2019 Sarcoidosis Coronary artery disease Port-A-Cath placement, interventional radiology, 03/04/2019 Thrombocytopenia, mild thrombocytopenia predating chemotherapy Oxaliplatin neuropathy Dyspnea on exertion-CT chest 02/25/2020 negative for PE, infection, pneumonitis; scheduled for PFTs 03/10/2020, followed by pulmonary   Disposition: Mr. Carter appears well.  He has metastatic colon cancer.  He completed a course of SBRT to a recurrent right liver lesion last month.  He is scheduled for a restaging CT and follow-up at [facility] in October.  He will return for an office visit in 6 months.  I am available to see him sooner as needed.  Thornton Papas, MD  02/26/2023  10:11 AM

## 2023-03-23 ENCOUNTER — Ambulatory Visit: Payer: Medicare PPO | Admitting: Pulmonary Disease

## 2023-03-23 ENCOUNTER — Encounter: Payer: Self-pay | Admitting: Pulmonary Disease

## 2023-03-23 VITALS — BP 126/64 | HR 64 | Ht 71.0 in | Wt 231.0 lb

## 2023-03-23 DIAGNOSIS — J441 Chronic obstructive pulmonary disease with (acute) exacerbation: Secondary | ICD-10-CM

## 2023-03-23 DIAGNOSIS — J90 Pleural effusion, not elsewhere classified: Secondary | ICD-10-CM | POA: Diagnosis not present

## 2023-03-23 NOTE — Progress Notes (Signed)
@Patient  ID: Luis [name], male    DOB: 1941-04-29, 82 y.o.   MRN: [id number]  Chief Complaint  Patient presents with  . Follow-up    Pt is here for Cough F/U visit. Pt states his cough has gotten better but still has a small cough with green sputum.     Referring provider: Ailene Ravel, MD  HPI:   82 y.o. man whom we are seeing in follow-up of multiple pulmonary issues including burnt out sarcoid, asthma, ongoing chronic of right pleural effusion.  Most recent oncology note from [facility] reviewed.  Doing okay.  At last visit was treated for asthma exacerbation.  Did not improve and then again with seeing her called me.  Additional prednisone sent 01/2023.  Seen twice in the interim and 01/2023 months with Buelah Manis, NP and once with Dr. Celine Mans.  Both notes reviewed.  It seems like things were getting better at that time.  Cough continues to improve no longer having sore throat.  Some what sounds like postnasal drip with mucus clearing in the morning otherwise no cough during the day.  Most recent CT [facility] 10/7 reports small pleural effusion, historically reported as moderate.  Stable small.  This seems good news.  HPI at initial visit: Overall, disorders are largely unchanged.  Formally followed with Dr. Kendrick Fries for sarcoidosis.  This has been quiescent for some time.  Most recent CT scan reviewed which reveals on my interpretation stable bilateral fibrosis, scattered calcified granulomas in the lung as well as significant calcified hilar and mediastinal lymphadenopathy and sequela of prior lung surgery.  He has been seen in clinic recently by Elisha Headland.  Primary complaint was dyspnea on exertion as well as cough.  These persist.  Chest imaging obtained as above.  PFTs obtained last week which reveal suggestion of mild to moderate restriction on spirometry without significant bronchodilator response.  Moderate restriction confirmed with total lung capacity 69% of predicted.  DLCO  corrects to within normal limits with adjustment for hemoglobin.  Cough and shortness of breath coincide with worsening nasal congestion.  Postnasal drip.  Not really using meds to address this this.  He is tolerating chemotherapy well.  Review of labs indicate stable hemoglobin without concern for symptomatic anemia at this time.   Questionaires / Pulmonary Flowsheets:   ACT:      No data to display          MMRC: mMRC Dyspnea Scale mMRC Score  08/09/2020  3:42 PM 0    Epworth:      No data to display          Tests:   FENO:  No results found for: "NITRICOXIDE"  PFT:    Latest Ref Rng & Units 03/10/2020    3:36 PM 08/30/2017    3:59 PM  PFT Results  FVC-Pre L 2.94  P 3.14   FVC-Predicted Pre % 68  P 72   FVC-Post L 2.90  P 3.18   FVC-Predicted Post % 67  P 73   Pre FEV1/FVC % % 66  P 70   Post FEV1/FCV % % 68  P 73   FEV1-Pre L 1.93  P 2.19   FEV1-Predicted Pre % 62  P 69   FEV1-Post L 1.96  P 2.31   DLCO uncorrected ml/min/mmHg 19.10  P 17.73   DLCO UNC% % 75  P 52   DLCO corrected ml/min/mmHg 21.40  P 20.68   DLCO COR %Predicted %  84  P 61   DLVA Predicted % 118  P 98   TLC L 5.02  P 4.99   TLC % Predicted % 69  P 68   RV % Predicted % 76  P 71     P Preliminary result  Personally reviewed and interpreted as normal spirometry, reduced DLCO, moderate reduced TLC  WALK:     08/28/2022    3:17 PM 08/28/2017   12:00 PM  SIX MIN WALK  Supplimental Oxygen during Test? (L/min)  No  Tech Comments: Patient was able to finish walk with sats in the 90's. pt walked a fast pace, tolerated walk well.     Imaging: Personally reviewed and as per EMR and discussion in this note  No results found.  Lab Results: Personally reviewed, eosinophils routinely 200-300 CBC    Component Value Date/Time   WBC 5.0 02/16/2023 0837   WBC 4.7 05/29/2021 0359   RBC 4.39 02/16/2023 0837   RBC 4.05 (L) 05/29/2021 0359   HGB 13.4 02/16/2023 0837   HCT 39.9 02/16/2023  0837   PLT 178 02/16/2023 0837   MCV 91 02/16/2023 0837   MCH 30.5 02/16/2023 0837   MCH 25.2 (L) 05/29/2021 0359   MCHC 33.6 02/16/2023 0837   MCHC 30.8 05/29/2021 0359   RDW 16.1 (H) 02/16/2023 0837   LYMPHSABS 0.4 (L) 05/29/2021 0054   MONOABS 1.0 05/29/2021 0054   EOSABS 0.1 05/29/2021 0054   BASOSABS 0.0 05/29/2021 0054    BMET    Component Value Date/Time   NA 136 02/16/2023 0837   K 4.5 02/16/2023 0837   CL 97 02/16/2023 0837   CO2 28 02/16/2023 0837   GLUCOSE 155 (H) 02/16/2023 0837   GLUCOSE 112 (H) 05/29/2021 0359   BUN 12 02/16/2023 0837   CREATININE 0.82 02/16/2023 0837   CREATININE 0.83 06/07/2020 1119   CREATININE 1.02 05/16/2016 0937   CALCIUM 8.9 02/16/2023 0837   GFRNONAA >60 05/29/2021 0359   GFRNONAA >60 06/07/2020 1119   GFRAA >60 03/10/2020 0855    BNP    Component Value Date/Time   BNP 79.4 01/22/2021 1634   BNP 43.4 05/16/2016 0937    ProBNP    Component Value Date/Time   PROBNP 69 08/01/2021 0857   PROBNP 60.0 08/10/2014 1052    Specialty Problems       Pulmonary Problems   Pulmonary fibrosis (HCC)   DOE (dyspnea on exertion)   Pleural effusion   Acute exacerbation of COPD with asthma (HCC)    No Known Allergies  Immunization History  Administered Date(s) Administered  . DTaP / IPV 09/25/2012  . Fluad Quad(high Dose 65+) 02/19/2019, 03/14/2022  . Influenza Split 03/27/2014  . Influenza, High Dose Seasonal PF 03/27/2014, 01/27/2017, 02/22/2018, 03/12/2021  . Influenza-Unspecified 02/24/2017, 03/10/2020  . PFIZER Comirnaty(Gray Top)Covid-19 Tri-Sucrose Vaccine 07/21/2019  . PFIZER(Purple Top)SARS-COV-2 Vaccination 07/11/2019, 07/21/2019, 01/26/2020, 10/26/2020  . Pneumococcal Conjugate-13 03/27/2014  . Pneumococcal Polysaccharide-23 11/10/2009  . Pneumococcal-Unspecified 03/12/2014  . Tdap 03/24/2011, 09/25/2012, 05/28/2021  . Zoster Recombinant(Shingrix) 09/01/2018, 02/19/2019  . Zoster, Live 03/21/2010    Past Medical  History:  Diagnosis Date  . Adenocarcinoma of cecum (HCC) 06/21/2017  . Arthritis    "mid back; hands; knees" (02/24/2015)  . Asthmatic bronchitis with acute exacerbation 06/27/2021  . Basal cell carcinoma    left shoulder; mid chest; right eyelid (02/24/2015)  . CAD (coronary artery disease)    a. 08/2014 Cath/PCI: LM nl, LAD 30p, D1 95 (2.25x12 Resolute Integrity DES),  LCX small, RI 100 (attempted PCI) - branches fill via L->L collats, RCA dominant, nl, RPDA/PLA nl, EF 60^. b. 02/24/2015 PCI CTO of Ramus DES x2.  . Chronic bronchitis (HCC)    hx  . Diverticulosis 05/2005  . Elevated lipase   . Fuchs' corneal dystrophy   . History of adenomatous polyp of colon 05/2005   8 mm adenoma  . History of blood transfusion    "related to some of my surgeries"  . Hyperlipidemia   . Hypertension   . Iron deficiency anemia due to chronic blood loss 06/21/2017  . Pneumonia   . Prostate cancer (HCC) 2011   S/P seed implant  . Sarcoidosis   . Thrombocytopenia (HCC)    a. Noted on prior labs, unclear of what w/u done.  . Trifascicular block     Tobacco History: Social History   Tobacco Use  Smoking Status Light Smoker  . Current packs/day: 0.00  . Types: Cigars, Cigarettes  . Last attempt to quit: 02/24/1956  . Years since quitting: 67.1  Smokeless Tobacco Never  Tobacco Comments   02/24/2015 "quit cigarettes ~ 2000 ago but smokes cigars about once every two months. Updated 01/16/2023 am   Ready to quit: Not Answered Counseling given: Not Answered Tobacco comments: 02/24/2015 "quit cigarettes ~ 2000 ago but smokes cigars about once every two months. Updated 01/16/2023 am   Continue to not smoke  Outpatient Encounter Medications as of 03/23/2023  Medication Sig  . acetaminophen (TYLENOL) 500 MG tablet Take 500-1,000 mg by mouth every 6 (six) hours as needed for moderate pain.  [name] albuterol (PROVENTIL) (2.5 MG/3ML) 0.083% nebulizer solution INHALE 3 ML BY NEBULIZATION EVERY 6 HOURS AS NEEDED  FOR WHEEZING OR SHORTNESS OF BREATH  . albuterol (VENTOLIN HFA) 108 (90 Base) MCG/ACT inhaler INHALE 1-2 PUFFS BY MOUTH EVERY 6 HOURS AS NEEDED FOR WHEEZE OR SHORTNESS OF BREATH  . Budeson-Glycopyrrol-Formoterol (BREZTRI AEROSPHERE) 160-9-4.8 MCG/ACT AERO Inhale 2 puffs into the lungs in the morning and at bedtime.  . Cholecalciferol (VITAMIN D3) 2000 units capsule Take 2,000 Units by mouth daily.  . clopidogrel (PLAVIX) 75 MG tablet TAKE 1 TABLET BY MOUTH EVERY DAY  . ezetimibe (ZETIA) 10 MG tablet Take 1 tablet (10 mg total) by mouth daily.  . fluticasone (FLONASE) 50 MCG/ACT nasal spray SPRAY 2 SPRAYS INTO EACH NOSTRIL EVERY DAY  . furosemide (LASIX) 20 MG tablet Take 1 tablet (20 mg total) by mouth daily.  [name] gabapentin (NEURONTIN) 300 MG capsule Take 300 mg by mouth daily as needed (neuropathy).  [name] ibuprofen (ADVIL) 200 MG tablet Take 200-400 mg by mouth every 6 (six) hours as needed for moderate pain or mild pain.  [name] levothyroxine (SYNTHROID) 50 MCG tablet Take 50 mcg by mouth daily.  . metaxalone (SKELAXIN) 800 MG tablet Take 800 mg by mouth daily as needed for muscle spasms.  . metoprolol succinate (TOPROL-XL) 50 MG 24 hr tablet TAKE 1 TABLET (50 MG TOTAL) BY MOUTH EVERY EVENING. TAKE WITH OR IMMEDIATELY FOLLOWING A MEAL.  . Multiple Vitamins-Minerals (VITRUM SENIOR) TABS Take 1 tablet by mouth daily.  . naproxen sodium (ALEVE) 220 MG tablet Take 440 mg by mouth 2 (two) times daily as needed (pain).  . nitroGLYCERIN (NITROSTAT) 0.4 MG SL tablet PLACE 1 TABLET UNDER THE TONGUE EVERY 5 MINUTES AS NEEDED FOR CHEST PAIN FOR 3 DOSES  . ranolazine (RANEXA) 500 MG 12 hr tablet Take 1 tablet (500 mg total) by mouth 2 (two) times daily.  . rosuvastatin (  CRESTOR) 20 MG tablet Take 1 tablet (20 mg total) by mouth daily.  . traMADol (ULTRAM) 50 MG tablet Take 2 tablets (100 mg total) by mouth every 6 (six) hours as needed.   No facility-administered encounter medications on file as of 03/23/2023.      Review of Systems  N/a  Physical Exam  BP 126/64 (BP Location: Left Arm, Cuff Size: Large)   Pulse 64   Ht 5\' 11"  (1.803 m)   Wt 231 lb (104.8 kg)   SpO2 94%   BMI 32.22 kg/m   Wt Readings from Last 5 Encounters:  03/23/23 231 lb (104.8 kg)  02/26/23 230 lb (104.3 kg)  02/16/23 230 lb 3.2 oz (104.4 kg)  02/07/23 228 lb 9.6 oz (103.7 kg)  01/16/23 230 lb 6.4 oz (104.5 kg)    BMI Readings from Last 5 Encounters:  03/23/23 32.22 kg/m  02/26/23 33.00 kg/m  02/16/23 32.56 kg/m  02/07/23 32.80 kg/m  01/16/23 32.13 kg/m     Physical Exam General: Well-appearing, no acute distress Eyes: EOMI, no icterus Neck: No JVD supple Respiratory: Clear and even, symmetric air entry bilaterally Cardiovascular: Regular rhythm, no murmurs Abdomen: Nondistended, bowel sounds present Psych: Normal mood, full affect   Assessment & Plan:   Recurrent right pleural effusion: Suspect related to local inflammation due to metastasis to liver, liver resection.  Cytology negative.  Improvement in dyspnea with drainage.  Serial thoracentesis as needed, will need to hold Plavix for 5 days in advance which he is well aware of and help coordinate.  Last thoracentesis 10/2022 with most recent scans x 2 at [facility] serial scans revealing small pleural effusion.  Overall seems improved.  Asthma: Day-to-day variation in symptoms.  Breztri seems to control things okay.  2 courses of prednisone in the last few months.  Eos intermittently elevated.  Still with cough and congestion at times.  Warning signs for exacerbation discussed today.  Sarcoidosis: Burnt out, no evidence of active disease on recent imaging.  Return in about 4 months (around 07/24/2023) for f/u Dr. Judeth Horn.   Karren Burly, MD 03/23/2023   I spent 40 minutes in the care of the patient including face-to-face visit, coordination of care, review of records.

## 2023-03-23 NOTE — Patient Instructions (Signed)
Neck to see you again  Okay to stop Lasix for the next week and then we will see if the fluid reaccumulate on the lung  Continue Breztri 2 puffs twice a day every day  Keep me posted on the upcoming PET scan and any other actions as a result of it  Return to clinic in 4 months or sooner as needed with Dr. Judeth Horn

## 2023-03-28 ENCOUNTER — Encounter: Payer: Self-pay | Admitting: Oncology

## 2023-03-28 NOTE — Telephone Encounter (Signed)
TC

## 2023-03-30 ENCOUNTER — Encounter: Payer: Self-pay | Admitting: Oncology

## 2023-03-30 ENCOUNTER — Encounter: Payer: Self-pay | Admitting: Interventional Cardiology

## 2023-03-30 NOTE — Telephone Encounter (Signed)
TC

## 2023-04-01 ENCOUNTER — Other Ambulatory Visit: Payer: Self-pay | Admitting: Nurse Practitioner

## 2023-04-01 DIAGNOSIS — J441 Chronic obstructive pulmonary disease with (acute) exacerbation: Secondary | ICD-10-CM

## 2023-05-01 ENCOUNTER — Other Ambulatory Visit: Payer: Self-pay

## 2023-05-01 MED ORDER — ROSUVASTATIN CALCIUM 20 MG PO TABS: 20.0000 mg | ORAL_TABLET | Freq: Every day | ORAL | 2 refills | Status: DC

## 2023-05-01 MED ORDER — CLOPIDOGREL BISULFATE 75 MG PO TABS: 75.0000 mg | ORAL_TABLET | Freq: Every day | ORAL | 2 refills | Status: DC

## 2023-05-01 NOTE — Telephone Encounter (Signed)
 Sent RX to requested Pharmacy

## 2023-05-16 ENCOUNTER — Encounter: Payer: Self-pay | Admitting: Pulmonary Disease

## 2023-05-17 MED ORDER — PREDNISONE 10 MG PO TABS: ORAL_TABLET | ORAL | 0 refills | Status: DC

## 2023-05-17 MED ORDER — AZITHROMYCIN 250 MG PO TABS: ORAL_TABLET | ORAL | 0 refills | Status: DC

## 2023-05-17 NOTE — Telephone Encounter (Signed)
Ill send in abx and short course of prednisone  Continue Breztri, nebulizer and mucinex

## 2023-05-17 NOTE — Telephone Encounter (Signed)
Dr. Judeth Horn is out of the office. Beth can you advise?

## 2023-05-25 ENCOUNTER — Ambulatory Visit (HOSPITAL_COMMUNITY)
Admission: RE | Admit: 2023-05-25 | Discharge: 2023-05-25 | Disposition: A | Discharge status: Home / Self Care | Payer: Medicare PPO | Source: Ambulatory Visit | Attending: Interventional Cardiology | Admitting: Interventional Cardiology

## 2023-05-25 DIAGNOSIS — I6523 Occlusion and stenosis of bilateral carotid arteries: Secondary | ICD-10-CM | POA: Diagnosis present

## 2023-05-26 NOTE — Telephone Encounter (Signed)
Please see recent mychart message sent by pt and advise.

## 2023-05-28 MED ORDER — PREDNISONE 10 MG PO TABS: ORAL_TABLET | ORAL | 0 refills | Status: AC

## 2023-05-28 NOTE — Telephone Encounter (Signed)
Prednisone low dose taper sent

## 2023-05-28 NOTE — Addendum Note (Signed)
Addended byVilma Meckel on: 05/28/2023 08:26 AM   Modules accepted: Orders

## 2023-05-29 NOTE — Telephone Encounter (Signed)
Prednisone and zpack sent in day of this message.

## 2023-07-16 ENCOUNTER — Encounter: Payer: Self-pay | Admitting: Pulmonary Disease

## 2023-07-16 ENCOUNTER — Ambulatory Visit: Payer: Medicare PPO | Admitting: Pulmonary Disease

## 2023-07-16 VITALS — BP 116/52 | Ht 70.0 in | Wt 236.6 lb

## 2023-07-16 DIAGNOSIS — J441 Chronic obstructive pulmonary disease with (acute) exacerbation: Secondary | ICD-10-CM

## 2023-07-16 DIAGNOSIS — J45901 Unspecified asthma with (acute) exacerbation: Secondary | ICD-10-CM

## 2023-07-16 DIAGNOSIS — J8410 Pulmonary fibrosis, unspecified: Secondary | ICD-10-CM

## 2023-07-16 DIAGNOSIS — J90 Pleural effusion, not elsewhere classified: Secondary | ICD-10-CM

## 2023-07-16 MED ORDER — PREDNISONE 10 MG PO TABS: ORAL_TABLET | ORAL | 0 refills | Status: AC

## 2023-07-16 NOTE — Patient Instructions (Signed)
Nice to see you again  Continue all your inhalers  The CT reports with most recent do seem like good news, happy to see this  Prednisone taper 20 for 7 days, 15 mg for 7 days, 10 mg for 7 days, then 5 mg for 7 days  I sent 10 mg tabs, will need to cut in half for 15 mg and 5 mg doses  Return to clinic in 3 months or sooner as needed with Dr. Judeth Horn

## 2023-07-16 NOTE — Progress Notes (Signed)
@Patient  ID: Luis [name], male    DOB: 12/05/1940, 83 y.o.   MRN: [id number]  Chief Complaint  Patient presents with  . Follow-up    Referring provider: HamrickDurward Fortes, MD  HPI:   83 y.o. man whom we are seeing in follow-up of multiple pulmonary issues including burnt out sarcoid, asthma, ongoing chronic of right pleural effusion appears to be decreasing over time.  Most recent oncology note from [facility] reviewed.  Doing okay.  Treated for asthma exacerbation in the interim.  Recent CT scan 06/2023 via [facility] oncology.  Reports persistent small right effusion.  Previously described as moderate.  Treated for exacerbation 05/2023.  Doing better.  Woke up this morning with sore throat.  Some wheezing.  A bit better through the morning but he is concerned.  Wheezing on exam.  Reviewed oncology note.  Good reports.  Seems like lesions have responded to chemotherapy.  HPI at initial visit: Overall, disorders are largely unchanged.  Formally followed with Dr. Kendrick Fries for sarcoidosis.  This has been quiescent for some time.  Most recent CT scan reviewed which reveals on my interpretation stable bilateral fibrosis, scattered calcified granulomas in the lung as well as significant calcified hilar and mediastinal lymphadenopathy and sequela of prior lung surgery.  He has been seen in clinic recently by Elisha Headland.  Primary complaint was dyspnea on exertion as well as cough.  These persist.  Chest imaging obtained as above.  PFTs obtained last week which reveal suggestion of mild to moderate restriction on spirometry without significant bronchodilator response.  Moderate restriction confirmed with total lung capacity 69% of predicted.  DLCO corrects to within normal limits with adjustment for hemoglobin.  Cough and shortness of breath coincide with worsening nasal congestion.  Postnasal drip.  Not really using meds to address this this.  He is tolerating chemotherapy well.  Review of labs indicate  stable hemoglobin without concern for symptomatic anemia at this time.   Questionaires / Pulmonary Flowsheets:   ACT:      No data to display          MMRC: mMRC Dyspnea Scale mMRC Score  08/09/2020  3:42 PM 0    Epworth:      No data to display          Tests:   FENO:  No results found for: "NITRICOXIDE"  PFT:    Latest Ref Rng & Units 03/10/2020    3:36 PM 08/30/2017    3:59 PM  PFT Results  FVC-Pre L 2.94  P 3.14   FVC-Predicted Pre % 68  P 72   FVC-Post L 2.90  P 3.18   FVC-Predicted Post % 67  P 73   Pre FEV1/FVC % % 66  P 70   Post FEV1/FCV % % 68  P 73   FEV1-Pre L 1.93  P 2.19   FEV1-Predicted Pre % 62  P 69   FEV1-Post L 1.96  P 2.31   DLCO uncorrected ml/min/mmHg 19.10  P 17.73   DLCO UNC% % 75  P 52   DLCO corrected ml/min/mmHg 21.40  P 20.68   DLCO COR %Predicted % 84  P 61   DLVA Predicted % 118  P 98   TLC L 5.02  P 4.99   TLC % Predicted % 69  P 68   RV % Predicted % 76  P 71     P Preliminary result  Personally reviewed and interpreted as normal spirometry, reduced  DLCO, moderate reduced TLC  WALK:     08/28/2022    3:17 PM 08/28/2017   12:00 PM  SIX MIN WALK  Supplimental Oxygen during Test? (L/min)  No  Tech Comments: Patient was able to finish walk with sats in the 90's. pt walked a fast pace, tolerated walk well.     Imaging: Personally reviewed and as per EMR and discussion in this note  No results found.  Lab Results: Personally reviewed, eosinophils routinely 200-300 CBC    Component Value Date/Time   WBC 5.0 02/16/2023 0837   WBC 4.7 05/29/2021 0359   RBC 4.39 02/16/2023 0837   RBC 4.05 (L) 05/29/2021 0359   HGB 13.4 02/16/2023 0837   HCT 39.9 02/16/2023 0837   PLT 178 02/16/2023 0837   MCV 91 02/16/2023 0837   MCH 30.5 02/16/2023 0837   MCH 25.2 (L) 05/29/2021 0359   MCHC 33.6 02/16/2023 0837   MCHC 30.8 05/29/2021 0359   RDW 16.1 (H) 02/16/2023 0837   LYMPHSABS 0.4 (L) 05/29/2021 0054   MONOABS 1.0  05/29/2021 0054   EOSABS 0.1 05/29/2021 0054   BASOSABS 0.0 05/29/2021 0054    BMET    Component Value Date/Time   NA 136 02/16/2023 0837   K 4.5 02/16/2023 0837   CL 97 02/16/2023 0837   CO2 28 02/16/2023 0837   GLUCOSE 155 (H) 02/16/2023 0837   GLUCOSE 112 (H) 05/29/2021 0359   BUN 12 02/16/2023 0837   CREATININE 0.82 02/16/2023 0837   CREATININE 0.83 06/07/2020 1119   CREATININE 1.02 05/16/2016 0937   CALCIUM 8.9 02/16/2023 0837   GFRNONAA >60 05/29/2021 0359   GFRNONAA >60 06/07/2020 1119   GFRAA >60 03/10/2020 0855    BNP    Component Value Date/Time   BNP 79.4 01/22/2021 1634   BNP 43.4 05/16/2016 0937    ProBNP    Component Value Date/Time   PROBNP 69 08/01/2021 0857   PROBNP 60.0 08/10/2014 1052    Specialty Problems       Pulmonary Problems   Pulmonary fibrosis (HCC)   DOE (dyspnea on exertion)   Pleural effusion   Acute exacerbation of COPD with asthma (HCC)    No Known Allergies  Immunization History  Administered Date(s) Administered  . DTaP / IPV 09/25/2012  . Fluad Quad(high Dose 65+) 02/19/2019, 03/14/2022  . Influenza Split 03/27/2014  . Influenza, High Dose Seasonal PF 03/27/2014, 01/27/2017, 02/22/2018, 03/12/2021  . Influenza-Unspecified 02/24/2017, 03/10/2020  . PFIZER Comirnaty(Gray Top)Covid-19 Tri-Sucrose Vaccine 07/21/2019  . PFIZER(Purple Top)SARS-COV-2 Vaccination 07/11/2019, 07/21/2019, 01/26/2020, 10/26/2020  . Pneumococcal Conjugate-13 03/27/2014  . Pneumococcal Polysaccharide-23 11/10/2009  . Pneumococcal-Unspecified 03/12/2014  . Tdap 03/24/2011, 09/25/2012, 05/28/2021  . Zoster Recombinant(Shingrix) 09/01/2018, 02/19/2019  . Zoster, Live 03/21/2010    Past Medical History:  Diagnosis Date  . Adenocarcinoma of cecum (HCC) 06/21/2017  . Arthritis    "mid back; hands; knees" (02/24/2015)  . Asthmatic bronchitis with acute exacerbation 06/27/2021  . Basal cell carcinoma    left shoulder; mid chest; right eyelid  (02/24/2015)  . CAD (coronary artery disease)    a. 08/2014 Cath/PCI: LM nl, LAD 30p, D1 95 (2.25x12 Resolute Integrity DES), LCX small, RI 100 (attempted PCI) - branches fill via L->L collats, RCA dominant, nl, RPDA/PLA nl, EF 60^. b. 02/24/2015 PCI CTO of Ramus DES x2.  . Chronic bronchitis (HCC)    hx  . Diverticulosis 05/2005  . Elevated lipase   . Fuchs' corneal dystrophy   . History of adenomatous polyp  of colon 05/2005   8 mm adenoma  . History of blood transfusion    "related to some of my surgeries"  . Hyperlipidemia   . Hypertension   . Iron deficiency anemia due to chronic blood loss 06/21/2017  . Pneumonia   . Prostate cancer (HCC) 2011   S/P seed implant  . Sarcoidosis   . Thrombocytopenia (HCC)    a. Noted on prior labs, unclear of what w/u done.  . Trifascicular block     Tobacco History: Social History   Tobacco Use  Smoking Status Light Smoker  . Current packs/day: 0.00  . Types: Cigars, Cigarettes  . Last attempt to quit: 02/24/1956  . Years since quitting: 67.4  Smokeless Tobacco Never  Tobacco Comments   02/24/2015 "quit cigarettes ~ 2000 ago but smokes cigars about once every two months. Updated 01/16/2023 am   Ready to quit: Not Answered Counseling given: Not Answered Tobacco comments: 02/24/2015 "quit cigarettes ~ 2000 ago but smokes cigars about once every two months. Updated 01/16/2023 am   Continue to not smoke  Outpatient Encounter Medications as of 07/16/2023  Medication Sig  . acetaminophen (TYLENOL) 500 MG tablet Take 500-1,000 mg by mouth every 6 (six) hours as needed for moderate pain.  [name] albuterol (PROVENTIL) (2.5 MG/3ML) 0.083% nebulizer solution INHALE 3 ML BY NEBULIZATION EVERY 6 HOURS AS NEEDED FOR WHEEZING OR SHORTNESS OF BREATH  . albuterol (VENTOLIN HFA) 108 (90 Base) MCG/ACT inhaler INHALE 1-2 PUFFS BY MOUTH EVERY 6 HOURS AS NEEDED FOR WHEEZE OR SHORTNESS OF BREATH  . azithromycin (ZITHROMAX) 250 MG tablet Per Zpack instructions  .  BREZTRI AEROSPHERE 160-9-4.8 MCG/ACT AERO INHALE 2 PUFFS INTO THE LUNGS IN THE MORNING AND AT BEDTIME. RINSE MOUTH AFTER USE.  [name] Cholecalciferol (VITAMIN D3) 2000 units capsule Take 2,000 Units by mouth daily.  . clopidogrel (PLAVIX) 75 MG tablet Take 1 tablet (75 mg total) by mouth daily.  [name] ezetimibe (ZETIA) 10 MG tablet TAKE 1 TABLET BY MOUTH EVERY DAY  . fluticasone (FLONASE) 50 MCG/ACT nasal spray SPRAY 2 SPRAYS INTO EACH NOSTRIL EVERY DAY  . furosemide (LASIX) 20 MG tablet Take 1 tablet (20 mg total) by mouth daily.  [name] gabapentin (NEURONTIN) 300 MG capsule Take 300 mg by mouth daily as needed (neuropathy).  [name] ibuprofen (ADVIL) 200 MG tablet Take 200-400 mg by mouth every 6 (six) hours as needed for moderate pain or mild pain.  [name] levothyroxine (SYNTHROID) 50 MCG tablet Take 50 mcg by mouth daily.  . metaxalone (SKELAXIN) 800 MG tablet Take 800 mg by mouth daily as needed for muscle spasms.  . metoprolol succinate (TOPROL-XL) 50 MG 24 hr tablet TAKE 1 TABLET (50 MG TOTAL) BY MOUTH EVERY EVENING. TAKE WITH OR IMMEDIATELY FOLLOWING A MEAL.  . Multiple Vitamins-Minerals (VITRUM SENIOR) TABS Take 1 tablet by mouth daily.  . naproxen sodium (ALEVE) 220 MG tablet Take 440 mg by mouth 2 (two) times daily as needed (pain).  . nitroGLYCERIN (NITROSTAT) 0.4 MG SL tablet PLACE 1 TABLET UNDER THE TONGUE EVERY 5 MINUTES AS NEEDED FOR CHEST PAIN FOR 3 DOSES  . predniSONE (DELTASONE) 10 MG tablet Take 2 tablets (20 mg total) by mouth daily with breakfast for 7 days, THEN 1.5 tablets (15 mg total) daily with breakfast for 7 days, THEN 1 tablet (10 mg total) daily with breakfast for 7 days, THEN 0.5 tablets (5 mg total) daily with breakfast for 7 days.  . ranolazine (RANEXA) 500 MG 12 hr tablet Take 1  tablet (500 mg total) by mouth 2 (two) times daily.  . rosuvastatin (CRESTOR) 20 MG tablet Take 1 tablet (20 mg total) by mouth daily.  . traMADol (ULTRAM) 50 MG tablet Take 2 tablets (100 mg total) by mouth every  6 (six) hours as needed.   No facility-administered encounter medications on file as of 07/16/2023.     Review of Systems  N/a  Physical Exam  BP (!) 116/52 (BP Location: Left Arm, Patient Position: Sitting, Cuff Size: Large)   Ht 5\' 10"  (1.778 m)   Wt 236 lb 9.6 oz (107.3 kg)   SpO2 97%   BMI 33.95 kg/m   Wt Readings from Last 5 Encounters:  07/16/23 236 lb 9.6 oz (107.3 kg)  03/23/23 231 lb (104.8 kg)  02/26/23 230 lb (104.3 kg)  02/16/23 230 lb 3.2 oz (104.4 kg)  02/07/23 228 lb 9.6 oz (103.7 kg)    BMI Readings from Last 5 Encounters:  07/16/23 33.95 kg/m  03/23/23 32.22 kg/m  02/26/23 33.00 kg/m  02/16/23 32.56 kg/m  02/07/23 32.80 kg/m     Physical Exam General: Well-appearing, no acute distress Eyes: EOMI, no icterus Neck: No JVD supple Respiratory: Clear and even, symmetric air entry bilaterally Cardiovascular: Regular rhythm, no murmurs Abdomen: Nondistended, bowel sounds present Psych: Normal mood, full affect   Assessment & Plan:   Recurrent right pleural effusion: Suspect related to local inflammation due to metastasis to liver, liver resection.  Cytology negative.  Improvement in dyspnea with drainage.  Serial thoracentesis as needed, will need to hold Plavix for 5 days in advance which he is well aware of and help coordinate.  Last thoracentesis 10/2022 with most recent scans  at [facility] serial scans revealing small pleural effusion.  Overall seems improved.  Asthma with acute exacerbation: Day-to-day variation in symptoms.  Breztri seems to control things okay.  Few courses of prednisone in the last few months.  Eos intermittently elevated.  Sore throat cough congestion.  Likely viral mediated exacerbation starting.  Prednisone taper sent today.  Sarcoidosis: Burnt out, no evidence of active disease on recent imaging.  Return in about 3 months (around 10/13/2023) for f/u Dr. Judeth Horn.   Karren Burly, MD 07/16/2023   I spent 40 minutes in  the care of the patient including face-to-face visit, coordination of care, review of records.

## 2023-08-11 ENCOUNTER — Other Ambulatory Visit: Payer: Self-pay | Admitting: Pulmonary Disease

## 2023-08-11 DIAGNOSIS — C189 Malignant neoplasm of colon, unspecified: Secondary | ICD-10-CM | POA: Diagnosis not present

## 2023-08-11 DIAGNOSIS — C182 Malignant neoplasm of ascending colon: Secondary | ICD-10-CM | POA: Diagnosis not present

## 2023-08-11 DIAGNOSIS — C787 Secondary malignant neoplasm of liver and intrahepatic bile duct: Secondary | ICD-10-CM | POA: Diagnosis not present

## 2023-08-13 DIAGNOSIS — L089 Local infection of the skin and subcutaneous tissue, unspecified: Secondary | ICD-10-CM | POA: Diagnosis not present

## 2023-08-13 DIAGNOSIS — S61219A Laceration without foreign body of unspecified finger without damage to nail, initial encounter: Secondary | ICD-10-CM | POA: Diagnosis not present

## 2023-08-14 NOTE — Progress Notes (Signed)
 Cardiology Office Note:    Date:  08/15/2023  ID:  Luis [name], DOB 28-Oct-1940, MRN [id number] PCP: Ailene Ravel, MD  [facility] Providers Cardiologist:  Orbie Pyo, MD       Patient Profile:      Coronary artery disease  S/p 2.25 x 12 mm DES to the D1 in 08/2014 S/p CTO PCI of the RI with DES x 2 in 02/2015 S/p 3 x 24 DES to pLAD, POBA to D1 in 12/2018 Myoview 02/10/2022: No ischemia or infarction, EF 63 LHC 04/25/2022: LAD stent patent, diagonal stent patent, diagonal angioplasty site patent, apical LAD 70 (too small for PCI); RI stent patent TTE 09/15/2022: EF 60-65, no RWMA, mild LVH, GR 1 DD, normal RVSF, mildly dilated ascending aorta (40 mm), RAP 3 Monitor 02/2022: NSR with frequent PACs, rare PVCs Carotid artery disease Carotid US 05/25/23: R ICA 100; L ICA 1-39 Hypertension Hyperlipidemia Hx of Pulmonary Sarcoidosis Recurrent R Pleural Effusion s/p thoracentesis in 08/2021 Asthma/Chronic Obstructive Pulmonary Disease  Pulm: Dr. Judeth Horn Prostate CA Metastatic Colon CA (liver involvement)             Discussed the use of AI scribe software for clinical note transcription with the patient, who gave verbal consent to proceed.  History of Present Illness Luis [name] is a 83 y.o. male who returns for follow up of CAD. He was last seen in 02/2023.   He has not had any chest pain or shortness of breath and has not needed to use nitroglycerin recently. He does not smoke anymore. He stopped taking Lasix as his ankle swelling resolved. He keeps it as an as-needed medication. He has resumed working out two weeks ago, which has improved his energy and stamina. No dizziness, passing out, bleeding, black stools, or bloody urine. He reports feeling weak in his legs sometimes but not due to breathing issues or chest pain.   Review of Systems  Gastrointestinal:  Negative for hematochezia and melena.  Genitourinary:  Negative for hematuria.  -See HPI     Studies  Reviewed:   EKG Interpretation Date/Time:  Wednesday August 15 2023 09:04:15 EST Ventricular Rate:  64 PR Interval:  264 QRS Duration:  134 QT Interval:  444 QTC Calculation: 458 R Axis:   -65  Text Interpretation: Sinus rhythm with 1st degree A-V block Right bundle branch block Left anterior fascicular block Bifascicular block No significant change since last tracing Confirmed by Tereso Newcomer [phone]) on 08/15/2023 9:29:50 AM     Results LABS - Chart review  K: 4.5 (02/16/2023) Cr: 0.82 (02/16/2023) ALT: 27 (02/16/2023) Hb: 10.4 (07/09/2023) K: 3.9 (07/09/2023) Ca: 7.7 (07/09/2023) AST: 60 (07/09/2023) ALT: 47 (07/09/2023) Alkaline phosphatase: 255 (07/09/2023) Albumin: 2.6 (07/09/2023) Total cholesterol: 169 (09/15/2022) HDL: 76 (09/15/2022) LDL: 78 (09/15/2022) Triglycerides: 81 (09/15/2022) LDL: 79 (02/16/2023)    Risk Assessment/Calculations:             Physical Exam:   VS:  BP (!) 120/54   Pulse 64   Ht 5\' 10"  (1.778 m)   Wt 235 lb 6.4 oz (106.8 kg)   SpO2 96%   BMI 33.78 kg/m    Wt Readings from Last 3 Encounters:  08/15/23 235 lb 6.4 oz (106.8 kg)  07/16/23 236 lb 9.6 oz (107.3 kg)  03/23/23 231 lb (104.8 kg)    Constitutional:      Appearance: Healthy appearance. Not in distress.  Neck:     Vascular: No JVR. JVD normal.  Pulmonary:     Breath sounds: Normal breath sounds. No wheezing. No rales.  Cardiovascular:     Normal rate. Regular rhythm.     Murmurs: There is no murmur.  Edema:    Peripheral edema present.    Ankle: bilateral trace edema of the ankle. Abdominal:     Palpations: Abdomen is soft.        Assessment and Plan:   Assessment & Plan Coronary Artery Disease S/p multiple percutaneous coronary interventions (PCIs) including a drug-eluting stent (DES) to the D1 in March 2016, a chronic total occlusion PCI of the right coronary artery with DES times two in September 2016, and a DES to the proximal left anterior descending artery  (LAD) with balloon angioplasty to D1 in July 2020. A cardiac catheterization in November 2023 showed patent LAD and diagonal stents, with a 70% stenosis in the apical LAD deemed too small for PCI. His right coronary artery stent was patent. An echocardiogram in April 2024 showed an ejection fraction of 60-65%. No current angina symptoms.   - Continue Plavix 75 mg daily - Continue Zetia 10 mg daily - Continue Toprol XL 50 mg daily - Continue nitroglycerin as needed - Continue Ranolazine 500 mg twice daily - Continue Crestor 20 mg daily  Carotid Artery Disease Right internal carotid artery has 100% chronic occlusion, left ICA has 1-39% stenosis.  - Continue Plavix 75 mg daily - Continue Crestor 20 mg daily - Continue annual surveillance   Hypertension Blood pressure is well controlled with current medication regimen. - Continue Toprol XL 50 mg daily  Hyperlipidemia LDL cholesterol in the 70s (September 2024). Given metastatic colon cancer with liver involvement and chronically elevated liver function tests due to ongoing chemotherapy, current lipid-lowering therapy is continued without changes. - Continue Zetia 10 mg daily - Continue Crestor 20 mg daily      Dispo:  Return in about 6 months (around 02/15/2024) for Routine Follow Up, w/ Dr. Lynnette Caffey.  Signed, Tereso Newcomer, PA-C

## 2023-08-15 ENCOUNTER — Ambulatory Visit: Payer: Medicare PPO | Attending: Physician Assistant | Admitting: Physician Assistant

## 2023-08-15 ENCOUNTER — Encounter: Payer: Self-pay | Admitting: Physician Assistant

## 2023-08-15 VITALS — BP 120/54 | HR 64 | Ht 70.0 in | Wt 235.4 lb

## 2023-08-15 DIAGNOSIS — I10 Essential (primary) hypertension: Secondary | ICD-10-CM

## 2023-08-15 DIAGNOSIS — I6523 Occlusion and stenosis of bilateral carotid arteries: Secondary | ICD-10-CM

## 2023-08-15 DIAGNOSIS — E7800 Pure hypercholesterolemia, unspecified: Secondary | ICD-10-CM

## 2023-08-15 DIAGNOSIS — I2510 Atherosclerotic heart disease of native coronary artery without angina pectoris: Secondary | ICD-10-CM

## 2023-08-15 MED ORDER — NITROGLYCERIN 0.4 MG SL SUBL: SUBLINGUAL_TABLET | SUBLINGUAL | 11 refills | Status: DC

## 2023-08-15 NOTE — Patient Instructions (Addendum)
 Medication Instructions:  Your physician recommends that you continue on your current medications as directed. Please refer to the Current Medication list given to you today.  *If you need a refill on your cardiac medications before your next appointment, please call your pharmacy*   Lab Work: None ordered  If you have labs (blood work) drawn today and your tests are completely normal, you will receive your results only by: MyChart Message (if you have MyChart) OR A paper copy in the mail If you have any lab test that is abnormal or we need to change your treatment, we will call you to review the results.   Testing/Procedures: None ordered   Follow-Up: At [facility], you and your health needs are our priority.  As part of our continuing mission to provide you with exceptional heart care, we have created designated Provider Care Teams.  These Care Teams include your primary Cardiologist (physician) and Advanced Practice Providers (APPs -  Physician Assistants and Nurse Practitioners) who all work together to provide you with the care you need, when you need it.  We recommend signing up for the patient portal called "MyChart".  Sign up information is provided on this After Visit Summary.  MyChart is used to connect with patients for Virtual Visits (Telemedicine).  Patients are able to view lab/test results, encounter notes, upcoming appointments, etc.  Non-urgent messages can be sent to your provider as well.   To learn more about what you can do with MyChart, go to [email or web address].    Your next appointment:   6 month(s)  Provider:    Alverda Skeans, MD   Other Instructions     1st Floor: - Lobby - Registration  - Pharmacy  - Lab - Cafe  2nd Floor: - PV Lab - Diagnostic Testing (echo, CT, nuclear med)  3rd Floor: - Vacant  4th Floor: - TCTS (cardiothoracic surgery) - AFib Clinic - Structural Heart Clinic - Vascular Surgery  - Vascular  Ultrasound  5th Floor: - [facility] (general and EP) - Clinical Pharmacy for coumadin, hypertension, lipid, weight-loss medications, and med management appointments    Valet parking services will be available as well.

## 2023-08-17 DIAGNOSIS — D860 Sarcoidosis of lung: Secondary | ICD-10-CM | POA: Diagnosis not present

## 2023-08-17 DIAGNOSIS — I771 Stricture of artery: Secondary | ICD-10-CM | POA: Diagnosis not present

## 2023-08-17 DIAGNOSIS — K219 Gastro-esophageal reflux disease without esophagitis: Secondary | ICD-10-CM | POA: Diagnosis not present

## 2023-08-17 DIAGNOSIS — Z7902 Long term (current) use of antithrombotics/antiplatelets: Secondary | ICD-10-CM | POA: Diagnosis not present

## 2023-08-17 DIAGNOSIS — N182 Chronic kidney disease, stage 2 (mild): Secondary | ICD-10-CM | POA: Diagnosis not present

## 2023-08-17 DIAGNOSIS — E785 Hyperlipidemia, unspecified: Secondary | ICD-10-CM | POA: Diagnosis not present

## 2023-08-17 DIAGNOSIS — N400 Enlarged prostate without lower urinary tract symptoms: Secondary | ICD-10-CM | POA: Diagnosis not present

## 2023-08-17 DIAGNOSIS — I2510 Atherosclerotic heart disease of native coronary artery without angina pectoris: Secondary | ICD-10-CM | POA: Diagnosis not present

## 2023-08-17 DIAGNOSIS — J8410 Pulmonary fibrosis, unspecified: Secondary | ICD-10-CM | POA: Diagnosis not present

## 2023-08-17 DIAGNOSIS — M4800 Spinal stenosis, site unspecified: Secondary | ICD-10-CM | POA: Diagnosis not present

## 2023-08-17 DIAGNOSIS — E669 Obesity, unspecified: Secondary | ICD-10-CM | POA: Diagnosis not present

## 2023-08-17 DIAGNOSIS — G620 Drug-induced polyneuropathy: Secondary | ICD-10-CM | POA: Diagnosis not present

## 2023-08-17 DIAGNOSIS — B351 Tinea unguium: Secondary | ICD-10-CM | POA: Diagnosis not present

## 2023-08-17 DIAGNOSIS — I700 Atherosclerosis of aorta: Secondary | ICD-10-CM | POA: Diagnosis not present

## 2023-08-17 DIAGNOSIS — J4489 Other specified chronic obstructive pulmonary disease: Secondary | ICD-10-CM | POA: Diagnosis not present

## 2023-08-17 DIAGNOSIS — M545 Low back pain, unspecified: Secondary | ICD-10-CM | POA: Diagnosis not present

## 2023-08-17 DIAGNOSIS — C44319 Basal cell carcinoma of skin of other parts of face: Secondary | ICD-10-CM | POA: Diagnosis not present

## 2023-08-17 DIAGNOSIS — D696 Thrombocytopenia, unspecified: Secondary | ICD-10-CM | POA: Diagnosis not present

## 2023-08-17 DIAGNOSIS — C44311 Basal cell carcinoma of skin of nose: Secondary | ICD-10-CM | POA: Diagnosis not present

## 2023-08-17 DIAGNOSIS — Z9181 History of falling: Secondary | ICD-10-CM | POA: Diagnosis not present

## 2023-08-17 DIAGNOSIS — Z791 Long term (current) use of non-steroidal anti-inflammatories (NSAID): Secondary | ICD-10-CM | POA: Diagnosis not present

## 2023-08-23 DIAGNOSIS — R0781 Pleurodynia: Secondary | ICD-10-CM | POA: Diagnosis not present

## 2023-08-23 DIAGNOSIS — R0602 Shortness of breath: Secondary | ICD-10-CM | POA: Diagnosis not present

## 2023-08-23 DIAGNOSIS — I10 Essential (primary) hypertension: Secondary | ICD-10-CM | POA: Diagnosis not present

## 2023-08-23 DIAGNOSIS — C189 Malignant neoplasm of colon, unspecified: Secondary | ICD-10-CM | POA: Diagnosis not present

## 2023-08-23 DIAGNOSIS — I2510 Atherosclerotic heart disease of native coronary artery without angina pectoris: Secondary | ICD-10-CM | POA: Diagnosis not present

## 2023-08-23 DIAGNOSIS — J8410 Pulmonary fibrosis, unspecified: Secondary | ICD-10-CM | POA: Diagnosis not present

## 2023-08-24 ENCOUNTER — Emergency Department (HOSPITAL_BASED_OUTPATIENT_CLINIC_OR_DEPARTMENT_OTHER): Admitting: Radiology

## 2023-08-24 ENCOUNTER — Inpatient Hospital Stay (HOSPITAL_BASED_OUTPATIENT_CLINIC_OR_DEPARTMENT_OTHER)
Admission: EM | Admit: 2023-08-24 | Discharge: 2023-08-26 | DRG: 193 | Disposition: A | Discharge status: Home / Self Care | Attending: Family Medicine | Admitting: Family Medicine

## 2023-08-24 ENCOUNTER — Emergency Department (HOSPITAL_BASED_OUTPATIENT_CLINIC_OR_DEPARTMENT_OTHER)

## 2023-08-24 ENCOUNTER — Other Ambulatory Visit: Payer: Self-pay

## 2023-08-24 ENCOUNTER — Encounter (HOSPITAL_BASED_OUTPATIENT_CLINIC_OR_DEPARTMENT_OTHER): Payer: Self-pay | Admitting: Emergency Medicine

## 2023-08-24 DIAGNOSIS — E7800 Pure hypercholesterolemia, unspecified: Secondary | ICD-10-CM | POA: Diagnosis not present

## 2023-08-24 DIAGNOSIS — I10 Essential (primary) hypertension: Secondary | ICD-10-CM | POA: Diagnosis present

## 2023-08-24 DIAGNOSIS — Z1152 Encounter for screening for COVID-19: Secondary | ICD-10-CM

## 2023-08-24 DIAGNOSIS — I5043 Acute on chronic combined systolic (congestive) and diastolic (congestive) heart failure: Secondary | ICD-10-CM | POA: Diagnosis not present

## 2023-08-24 DIAGNOSIS — C7800 Secondary malignant neoplasm of unspecified lung: Secondary | ICD-10-CM | POA: Diagnosis present

## 2023-08-24 DIAGNOSIS — K769 Liver disease, unspecified: Secondary | ICD-10-CM

## 2023-08-24 DIAGNOSIS — Z955 Presence of coronary angioplasty implant and graft: Secondary | ICD-10-CM

## 2023-08-24 DIAGNOSIS — J189 Pneumonia, unspecified organism: Secondary | ICD-10-CM | POA: Diagnosis present

## 2023-08-24 DIAGNOSIS — Z8249 Family history of ischemic heart disease and other diseases of the circulatory system: Secondary | ICD-10-CM | POA: Diagnosis not present

## 2023-08-24 DIAGNOSIS — Z7902 Long term (current) use of antithrombotics/antiplatelets: Secondary | ICD-10-CM | POA: Diagnosis not present

## 2023-08-24 DIAGNOSIS — I452 Bifascicular block: Secondary | ICD-10-CM | POA: Diagnosis present

## 2023-08-24 DIAGNOSIS — R911 Solitary pulmonary nodule: Secondary | ICD-10-CM | POA: Diagnosis not present

## 2023-08-24 DIAGNOSIS — R079 Chest pain, unspecified: Secondary | ICD-10-CM | POA: Diagnosis not present

## 2023-08-24 DIAGNOSIS — C180 Malignant neoplasm of cecum: Secondary | ICD-10-CM | POA: Diagnosis present

## 2023-08-24 DIAGNOSIS — Z85038 Personal history of other malignant neoplasm of large intestine: Secondary | ICD-10-CM | POA: Diagnosis not present

## 2023-08-24 DIAGNOSIS — E039 Hypothyroidism, unspecified: Secondary | ICD-10-CM | POA: Diagnosis present

## 2023-08-24 DIAGNOSIS — J9601 Acute respiratory failure with hypoxia: Secondary | ICD-10-CM | POA: Diagnosis present

## 2023-08-24 DIAGNOSIS — J8410 Pulmonary fibrosis, unspecified: Secondary | ICD-10-CM | POA: Diagnosis not present

## 2023-08-24 DIAGNOSIS — Z961 Presence of intraocular lens: Secondary | ICD-10-CM | POA: Diagnosis present

## 2023-08-24 DIAGNOSIS — D869 Sarcoidosis, unspecified: Secondary | ICD-10-CM | POA: Diagnosis present

## 2023-08-24 DIAGNOSIS — J90 Pleural effusion, not elsewhere classified: Secondary | ICD-10-CM

## 2023-08-24 DIAGNOSIS — Z79890 Hormone replacement therapy (postmenopausal): Secondary | ICD-10-CM

## 2023-08-24 DIAGNOSIS — Z9049 Acquired absence of other specified parts of digestive tract: Secondary | ICD-10-CM | POA: Diagnosis not present

## 2023-08-24 DIAGNOSIS — I2510 Atherosclerotic heart disease of native coronary artery without angina pectoris: Secondary | ICD-10-CM | POA: Diagnosis present

## 2023-08-24 DIAGNOSIS — R599 Enlarged lymph nodes, unspecified: Secondary | ICD-10-CM

## 2023-08-24 DIAGNOSIS — J440 Chronic obstructive pulmonary disease with acute lower respiratory infection: Secondary | ICD-10-CM | POA: Diagnosis present

## 2023-08-24 DIAGNOSIS — Z947 Corneal transplant status: Secondary | ICD-10-CM

## 2023-08-24 DIAGNOSIS — C787 Secondary malignant neoplasm of liver and intrahepatic bile duct: Secondary | ICD-10-CM | POA: Diagnosis present

## 2023-08-24 DIAGNOSIS — I6523 Occlusion and stenosis of bilateral carotid arteries: Secondary | ICD-10-CM | POA: Diagnosis not present

## 2023-08-24 DIAGNOSIS — C189 Malignant neoplasm of colon, unspecified: Secondary | ICD-10-CM | POA: Diagnosis not present

## 2023-08-24 DIAGNOSIS — Z79899 Other long term (current) drug therapy: Secondary | ICD-10-CM

## 2023-08-24 DIAGNOSIS — J9811 Atelectasis: Secondary | ICD-10-CM | POA: Diagnosis not present

## 2023-08-24 DIAGNOSIS — Z85828 Personal history of other malignant neoplasm of skin: Secondary | ICD-10-CM

## 2023-08-24 DIAGNOSIS — Z9842 Cataract extraction status, left eye: Secondary | ICD-10-CM

## 2023-08-24 DIAGNOSIS — R0602 Shortness of breath: Secondary | ICD-10-CM | POA: Diagnosis not present

## 2023-08-24 DIAGNOSIS — E785 Hyperlipidemia, unspecified: Secondary | ICD-10-CM | POA: Diagnosis present

## 2023-08-24 DIAGNOSIS — R0902 Hypoxemia: Secondary | ICD-10-CM

## 2023-08-24 DIAGNOSIS — E871 Hypo-osmolality and hyponatremia: Secondary | ICD-10-CM | POA: Diagnosis present

## 2023-08-24 DIAGNOSIS — I5020 Unspecified systolic (congestive) heart failure: Secondary | ICD-10-CM | POA: Diagnosis not present

## 2023-08-24 DIAGNOSIS — Z8546 Personal history of malignant neoplasm of prostate: Secondary | ICD-10-CM

## 2023-08-24 DIAGNOSIS — I3139 Other pericardial effusion (noninflammatory): Secondary | ICD-10-CM | POA: Diagnosis present

## 2023-08-24 DIAGNOSIS — R591 Generalized enlarged lymph nodes: Secondary | ICD-10-CM | POA: Diagnosis present

## 2023-08-24 DIAGNOSIS — I110 Hypertensive heart disease with heart failure: Secondary | ICD-10-CM | POA: Diagnosis present

## 2023-08-24 DIAGNOSIS — F17290 Nicotine dependence, other tobacco product, uncomplicated: Secondary | ICD-10-CM | POA: Diagnosis present

## 2023-08-24 DIAGNOSIS — J168 Pneumonia due to other specified infectious organisms: Secondary | ICD-10-CM | POA: Diagnosis not present

## 2023-08-24 DIAGNOSIS — Z9841 Cataract extraction status, right eye: Secondary | ICD-10-CM

## 2023-08-24 DIAGNOSIS — R918 Other nonspecific abnormal finding of lung field: Secondary | ICD-10-CM | POA: Diagnosis not present

## 2023-08-24 DIAGNOSIS — I517 Cardiomegaly: Secondary | ICD-10-CM | POA: Diagnosis not present

## 2023-08-24 DIAGNOSIS — R0789 Other chest pain: Secondary | ICD-10-CM | POA: Diagnosis not present

## 2023-08-24 DIAGNOSIS — R0781 Pleurodynia: Secondary | ICD-10-CM | POA: Diagnosis not present

## 2023-08-24 DIAGNOSIS — I6529 Occlusion and stenosis of unspecified carotid artery: Secondary | ICD-10-CM | POA: Diagnosis present

## 2023-08-24 LAB — BASIC METABOLIC PANEL
Anion gap: 9 (ref 5–15)
BUN: 20 mg/dL (ref 8–23)
CO2: 23 mmol/L (ref 22–32)
Calcium: 8.5 mg/dL — ABNORMAL LOW (ref 8.9–10.3)
Chloride: 99 mmol/L (ref 98–111)
Creatinine, Ser: 0.98 mg/dL (ref 0.61–1.24)
GFR, Estimated: >60 mL/min (ref >60)
Glucose, Bld: 116 mg/dL — ABNORMAL HIGH (ref 70–99)
Potassium: 4.6 mmol/L (ref 3.5–5.1)
Sodium: 131 mmol/L — ABNORMAL LOW (ref 135–145)

## 2023-08-24 LAB — CBC
HCT: 36.2 % — ABNORMAL LOW (ref 39.0–52.0)
Hemoglobin: 12 g/dL — ABNORMAL LOW (ref 13.0–17.0)
MCH: 33.4 pg (ref 26.0–34.0)
MCHC: 33.1 g/dL (ref 30.0–36.0)
MCV: 100.8 fL — ABNORMAL HIGH (ref 80.0–100.0)
Platelets: 142 10*3/uL — ABNORMAL LOW (ref 150–400)
RBC: 3.59 MIL/uL — ABNORMAL LOW (ref 4.22–5.81)
RDW: 16.5 % — ABNORMAL HIGH (ref 11.5–15.5)
WBC: 8.8 10*3/uL (ref 4.0–10.5)
nRBC: 0 % (ref 0.0–0.2)

## 2023-08-24 LAB — RESP PANEL BY RT-PCR (RSV, FLU A&B, COVID)  RVPGX2
Influenza A by PCR: NEGATIVE
Influenza B by PCR: NEGATIVE
Resp Syncytial Virus by PCR: NEGATIVE
SARS Coronavirus 2 by RT PCR: NEGATIVE

## 2023-08-24 LAB — TROPONIN I (HIGH SENSITIVITY)
Troponin I (High Sensitivity): 15 ng/L (ref <18)
Troponin I (High Sensitivity): 14 ng/L (ref <18)

## 2023-08-24 LAB — D-DIMER, QUANTITATIVE: D-Dimer, Quant: 2.91 ug{FEU}/mL — ABNORMAL HIGH (ref 0.00–0.50)

## 2023-08-24 LAB — BRAIN NATRIURETIC PEPTIDE: B Natriuretic Peptide: 179.7 pg/mL — ABNORMAL HIGH (ref 0.0–100.0)

## 2023-08-24 MED ORDER — SODIUM CHLORIDE 0.9 % IV SOLN
500.0000 mg | Freq: Once | INTRAVENOUS | Status: AC
  Administered 2023-08-25: 500 mg via INTRAVENOUS
  Filled 2023-08-24: qty 5

## 2023-08-24 MED ORDER — IPRATROPIUM-ALBUTEROL 0.5-2.5 (3) MG/3ML IN SOLN
3.0000 mL | Freq: Once | RESPIRATORY_TRACT | Status: AC
  Administered 2023-08-24: 3 mL via RESPIRATORY_TRACT
  Filled 2023-08-24: qty 3

## 2023-08-24 MED ORDER — METHYLPREDNISOLONE SODIUM SUCC 125 MG IJ SOLR
125.0000 mg | Freq: Once | INTRAMUSCULAR | Status: AC
  Administered 2023-08-24: 125 mg via INTRAVENOUS
  Filled 2023-08-24: qty 2

## 2023-08-24 MED ORDER — FENTANYL CITRATE PF 50 MCG/ML IJ SOSY
25.0000 ug | PREFILLED_SYRINGE | Freq: Once | INTRAMUSCULAR | Status: AC
  Administered 2023-08-24: 25 ug via INTRAVENOUS
  Filled 2023-08-24: qty 1

## 2023-08-24 MED ORDER — IOHEXOL 350 MG/ML SOLN
100.0000 mL | Freq: Once | INTRAVENOUS | Status: AC | PRN
  Administered 2023-08-24: 75 mL via INTRAVENOUS

## 2023-08-24 MED ORDER — SODIUM CHLORIDE 0.9 % IV SOLN
2.0000 g | Freq: Once | INTRAVENOUS | Status: AC
  Administered 2023-08-24: 2 g via INTRAVENOUS
  Filled 2023-08-24: qty 20

## 2023-08-24 NOTE — ED Triage Notes (Signed)
 Chest pain started last night.  Used nitroglycerin x 3  Seen at [facility]. 7 hour drive home (7 hour drive to ) Continued pain and so Painful to take a deep breath

## 2023-08-24 NOTE — ED Notes (Signed)
 RT Note: Patient tolerating the breathing treatment at this time

## 2023-08-24 NOTE — ED Provider Notes (Signed)
 [place] [facility]GENC[facility] DEPARTMENT AT [facility] Provider Note   CSN: [id number] Arrival date & time: 08/24/23  1714     History  Chief Complaint  Patient presents with   Chest Pain    Luis [name] is a 83 y.o. male.  83 year old male with past medical history of COPD and colon cancer with metastases presenting to the emergency department today with chest pain.  Patient states he is having pain in the center of his chest that has been going on for the past few days.  He was initially evaluated in [place] when he was out of town and was told that his workup was reassuring.  He was told to come to the ER when he returned home.  The patient states that he is coughing up some sputum mostly in the mornings.  Denies any associated fevers.  He apparently had a full workup including CT scan in [place].  He states that shortness of breath got worse on the way home.  He denies any leg pain or swelling.  He normally is not on oxygen.  He was requiring oxygen when he arrived here to the ER.   Chest Pain Associated symptoms: cough        Home Medications Prior to Admission medications   Medication Sig Start Date End Date Taking? Authorizing Provider  acetaminophen (TYLENOL) 500 MG tablet Take 500-1,000 mg by mouth every 6 (six) hours as needed for moderate pain.    [provider]  albuterol (PROVENTIL) (2.5 MG/3ML) 0.083% nebulizer solution INHALE 3 ML BY NEBULIZATION EVERY 6 HOURS AS NEEDED FOR WHEEZING OR SHORTNESS OF BREATH 02/14/23   Cobb, Ruby Cola, NP  albuterol (VENTOLIN HFA) 108 (90 Base) MCG/ACT inhaler INHALE 1-2 PUFFS BY MOUTH EVERY 6 HOURS AS NEEDED FOR WHEEZE OR SHORTNESS OF BREATH 07/03/22   Hunsucker, Lesia Sago, MD  BREZTRI AEROSPHERE 160-9-4.8 MCG/ACT AERO INHALE 2 PUFFS INTO THE LUNGS IN THE MORNING AND AT BEDTIME. RINSE MOUTH AFTER USE. 04/01/23   Hunsucker, Lesia Sago, MD  Cholecalciferol (VITAMIN D3) 2000 units capsule Take 2,000 Units by mouth daily.     [provider]  clopidogrel (PLAVIX) 75 MG tablet Take 1 tablet (75 mg total) by mouth daily. 05/01/23   Tereso Newcomer T, PA-C  ezetimibe (ZETIA) 10 MG tablet TAKE 1 TABLET BY MOUTH EVERY DAY 04/02/23   Swinyer, Zachary George, NP  fluticasone (FLONASE) 50 MCG/ACT nasal spray SPRAY 2 SPRAYS INTO EACH NOSTRIL EVERY DAY 06/13/22   Hunsucker, Lesia Sago, MD  furosemide (LASIX) 20 MG tablet Take 20 mg by mouth daily as needed for fluid or edema.    [provider]  gabapentin (NEURONTIN) 300 MG capsule Take 300 mg by mouth daily as needed (neuropathy).    [provider]  ibuprofen (ADVIL) 200 MG tablet Take 200-400 mg by mouth every 6 (six) hours as needed for moderate pain or mild pain.    [provider]  levothyroxine (SYNTHROID) 50 MCG tablet Take 50 mcg by mouth daily. 02/20/23   [provider]  metoprolol succinate (TOPROL-XL) 50 MG 24 hr tablet TAKE 1 TABLET (50 MG TOTAL) BY MOUTH EVERY EVENING. TAKE WITH OR IMMEDIATELY FOLLOWING A MEAL. 11/07/22   Swinyer, Zachary George, NP  Multiple Vitamins-Minerals (VITRUM SENIOR) TABS Take 1 tablet by mouth daily.    [provider]  naproxen sodium (ALEVE) 220 MG tablet Take 440 mg by mouth 2 (two) times daily as needed (pain).    [provider]  nitroGLYCERIN (NITROSTAT) 0.4 MG SL tablet PLACE 1 TABLET UNDER THE TONGUE EVERY 5 MINUTES AS NEEDED FOR CHEST PAIN FOR 3 DOSES 08/15/23   Tereso Newcomer T, PA-C  ranolazine (RANEXA) 500 MG 12 hr tablet Take 1 tablet (500 mg total) by mouth 2 (two) times daily. 10/16/22   Corky Crafts, MD  rosuvastatin (CRESTOR) 20 MG tablet Take 1 tablet (20 mg total) by mouth daily. 05/01/23   Tereso Newcomer T, PA-C  traMADol (ULTRAM) 50 MG tablet Take 2 tablets (100 mg total) by mouth every 6 (six) hours as needed. 05/29/21   Emelia Loron, MD      Allergies    Patient has no known allergies.    Review of Systems   Review of Systems  Respiratory:  Positive for  cough.   Cardiovascular:  Positive for chest pain.  All other systems reviewed and are negative.   Physical Exam Updated Vital Signs BP 114/72   Pulse 79   Temp 97.7 F (36.5 C)   Resp (!) 21   SpO2 93%  Physical Exam Vitals and nursing note reviewed.   Gen: NAD Eyes: PERRL, EOMI HEENT: no oropharyngeal swelling Neck: trachea midline Resp: Diminished bilaterally Card: RRR, no murmurs, rubs, or gallops Abd: nontender, nondistended Extremities: no calf tenderness, no edema Vascular: 2+ radial pulses bilaterally, 2+ DP pulses bilaterally Neuro: No focal deficits Skin: no rashes Psyc: acting appropriately   ED Results / Procedures / Treatments   Labs (all labs ordered are listed, but only abnormal results are displayed) Labs Reviewed  BASIC METABOLIC PANEL - Abnormal; Notable for the following components:      Result Value   Sodium 131 (*)    Glucose, Bld 116 (*)    Calcium 8.5 (*)    All other components within normal limits  CBC - Abnormal; Notable for the following components:   RBC 3.59 (*)    Hemoglobin 12.0 (*)    HCT 36.2 (*)    MCV 100.8 (*)    RDW 16.5 (*)    Platelets 142 (*)    All other components within normal limits  D-DIMER, QUANTITATIVE - Abnormal; Notable for the following components:   D-Dimer, Quant 2.91 (*)    All other components within normal limits  BRAIN NATRIURETIC PEPTIDE - Abnormal; Notable for the following components:   B Natriuretic Peptide 179.7 (*)    All other components within normal limits  RESP PANEL BY RT-PCR (RSV, FLU A&B, COVID)  [id number]  CULTURE, BLOOD (ROUTINE X 2)  CULTURE, BLOOD (ROUTINE X 2)  TROPONIN I (HIGH SENSITIVITY)  TROPONIN I (HIGH SENSITIVITY)    EKG EKG Interpretation Date/Time:  Friday August 24 2023 17:25:08 EDT Ventricular Rate:  69 PR Interval:  226 QRS Duration:  142 QT Interval:  430 QTC Calculation: 460 R Axis:   -65  Text Interpretation: Sinus rhythm with 1st degree A-V block Right bundle  branch block Left anterior fascicular block Bifascicular block Abnormal ECG When compared with ECG of 15-Aug-2023 09:04, No significant change was found Confirmed by Beckey Downing [phone]) on 08/24/2023 5:53:05 PM  Radiology CT Angio Chest PE W and/or Wo Contrast Result Date: 08/24/2023 CLINICAL DATA:  Chest pain since last night.  Pain with deep breath. EXAM: CT ANGIOGRAPHY CHEST WITH CONTRAST TECHNIQUE: Multidetector CT imaging of the chest was performed using the standard protocol during bolus administration of intravenous contrast. Multiplanar CT image reconstructions and MIPs were obtained to evaluate the vascular anatomy. RADIATION DOSE REDUCTION: This exam  was performed according to the departmental dose-optimization program which includes automated exposure control, adjustment of the mA and/or kV according to patient size and/or use of iterative reconstruction technique. CONTRAST:  75mL OMNIPAQUE IOHEXOL 350 MG/ML SOLN COMPARISON:  Same day chest radiograph and CT 05/28/2021 FINDINGS: Cardiovascular: Small pericardial effusion. Aortic and coronary artery atherosclerotic calcification. Negative for pulmonary embolism. Right chest wall Port-A-Cath. Mediastinum/Nodes: Calcified mediastinal and hilar lymph nodes. Increased size of right diaphragmatic lymph nodes measuring up to 1.0 cm (series 5/image 234 and 5/246). Trachea and esophagus are unremarkable. Lungs/Pleura: Small right pleural effusion. Postoperative change of right basilar wedge resection diffuse bronchial wall thickening. Interlobular septal thickening in both lungs greatest in the right lower lobe. Upper Abdomen: Ill-defined hypoattenuating lesions in the posterior right hepatic lobe with surrounding small volume free fluid (series 4/image 168). This is new compared to 05/28/2021. There is a second hypoattenuating lesion in the right hepatic lobe which may correspond ablation changes as seen on 05/28/2021 (circa series 4/image 154). Right lower  lobe volume loss/atelectasis. Subpleural consolidation in the left lower lobe measuring 2.9 x 1.6 cm (6/125) favored to represent round atelectasis. No pneumothorax. Musculoskeletal: No acute fracture or destructive osseous lesion. Review of the MIP images confirms the above findings. IMPRESSION: 1. Negative for pulmonary embolism. 2. Diffuse bronchial wall thickening and interlobular septal thickening may be due to atypical infection or edema. 3. Small right pleural effusion with right lower lobe volume loss/atelectasis. 4. Enlarged right diaphragmatic lymph nodes concerning for metastasis. 5. Subpleural consolidation in the left lower lobe measuring 2.9 x 1.6 cm favored to represent round atelectasis. Follow-up CT in 3 months or PET/CT is recommended for further evaluation. 6. New vague hypoattenuating lesion in the posterior right hepatic lobe with surrounding small volume free fluid concerning for metastatic disease. Further evaluation by dedicated CT abdomen pelvis with IV contrast is recommended. 7. Small pericardial effusion. 8. Aortic Atherosclerosis (ICD10-I70.0). Electronically Signed   By: Minerva Fester M.D.   On: 08/24/2023 23:06   DG Chest Port 1 View Result Date: 08/24/2023 CLINICAL DATA:  Chest pain beginning last night EXAM: PORTABLE CHEST 1 VIEW COMPARISON:  02/07/2023 FINDINGS: Power port on the right with the tip in the SVC above the right atrium. Chronic cardiomegaly. Chronic hilar and mediastinal nodal calcification. Chronic lung markings with previous surgery in the right lower lobe. Chronic pleural blunting on the right. IMPRESSION: No active disease. Chronic cardiomegaly. Chronic lung markings with previous surgery in the right lower lobe. Chronic pleural blunting on the right. Electronically Signed   By: Paulina Fusi M.D.   On: 08/24/2023 19:36    Procedures Procedures    Medications Ordered in ED Medications  cefTRIAXone (ROCEPHIN) 2 g in sodium chloride 0.9 % 100 mL IVPB (has  no administration in time range)  azithromycin (ZITHROMAX) 500 mg in sodium chloride 0.9 % 250 mL IVPB (has no administration in time range)  ipratropium-albuterol (DUONEB) 0.5-2.5 (3) MG/3ML nebulizer solution 3 mL (3 mLs Nebulization Given 08/24/23 1801)  ipratropium-albuterol (DUONEB) 0.5-2.5 (3) MG/3ML nebulizer solution 3 mL (3 mLs Nebulization Given 08/24/23 1801)  ipratropium-albuterol (DUONEB) 0.5-2.5 (3) MG/3ML nebulizer solution 3 mL (3 mLs Nebulization Given 08/24/23 1801)  methylPREDNISolone sodium succinate (SOLU-MEDROL) 125 mg/2 mL injection 125 mg (125 mg Intravenous Given 08/24/23 1834)  fentaNYL (SUBLIMAZE) injection 25 mcg (25 mcg Intravenous Given 08/24/23 2033)  iohexol (OMNIPAQUE) 350 MG/ML injection 100 mL (75 mLs Intravenous Contrast Given 08/24/23 2205)    ED Course/ Medical Decision Making/ A&P  Medical Decision Making 83 year old male with past medical history of colon cancer with metastases and COPD presenting to the emergency department today with chest pain shortness of breath.  I will further evaluate patient here with basic labs Wels and EKG, chest x-ray, and troponin further evaluation for ACS, pulmonary edema, pulmonary infiltrates, pneumothorax.  Will also obtain a D-dimer to evaluate for pulmonary embolism given his cancer history and recent travel.  I will give patient DuoNebs here as well as Solu-Medrol as he is diminished here on exam.  Also obtain an RSV/COVID/flu swab on the patient.  I will reevaluate for ultimate disposition.  The patient's EKG interpreted by me shows a sinus rhythm with a rate of 69 with normal axis, normal intervals, and nonspecific ST-T changes with left anterior fascicular block and right bundle branch block.  The patient's D-dimer is elevated.  CT scan shows findings concerning for possible atypical pneumonia versus pulmonary edema.  The patient is requiring oxygen.  He is stable on 2 L.  He is at 91 to 92% on  2 to 3 L.  A call was placed to hospitalist service for admission.  Amount and/or Complexity of Data Reviewed Labs: ordered. Radiology: ordered.  Risk Prescription drug management. Decision regarding hospitalization.           Final Clinical Impression(s) / ED Diagnoses Final diagnoses:  Pneumonia due to infectious organism, unspecified laterality, unspecified part of lung  Hypoxia    Rx / DC Orders ED Discharge Orders     None         Durwin Glaze, MD 08/24/23 2319

## 2023-08-24 NOTE — ED Notes (Signed)
 RT Note:Upon assessing this patient, his oxygen saturation while on room air was 86%. Patient was stating he couldn't take a deep breath it hurt too much in the center of his chest.  Patient had to be placed on 2lpm  to maintain an oxygen saturation at 94%.    Patient tolerating well at this time

## 2023-08-25 ENCOUNTER — Encounter: Payer: Self-pay | Admitting: Pulmonary Disease

## 2023-08-25 ENCOUNTER — Other Ambulatory Visit: Payer: Self-pay

## 2023-08-25 DIAGNOSIS — I3139 Other pericardial effusion (noninflammatory): Secondary | ICD-10-CM | POA: Diagnosis present

## 2023-08-25 DIAGNOSIS — Z8546 Personal history of malignant neoplasm of prostate: Secondary | ICD-10-CM | POA: Diagnosis not present

## 2023-08-25 DIAGNOSIS — E785 Hyperlipidemia, unspecified: Secondary | ICD-10-CM | POA: Diagnosis present

## 2023-08-25 DIAGNOSIS — Z7902 Long term (current) use of antithrombotics/antiplatelets: Secondary | ICD-10-CM | POA: Diagnosis not present

## 2023-08-25 DIAGNOSIS — E039 Hypothyroidism, unspecified: Secondary | ICD-10-CM | POA: Diagnosis present

## 2023-08-25 DIAGNOSIS — Z85828 Personal history of other malignant neoplasm of skin: Secondary | ICD-10-CM | POA: Diagnosis not present

## 2023-08-25 DIAGNOSIS — C787 Secondary malignant neoplasm of liver and intrahepatic bile duct: Secondary | ICD-10-CM | POA: Diagnosis present

## 2023-08-25 DIAGNOSIS — C180 Malignant neoplasm of cecum: Secondary | ICD-10-CM

## 2023-08-25 DIAGNOSIS — I5020 Unspecified systolic (congestive) heart failure: Secondary | ICD-10-CM | POA: Diagnosis not present

## 2023-08-25 DIAGNOSIS — Z955 Presence of coronary angioplasty implant and graft: Secondary | ICD-10-CM | POA: Diagnosis not present

## 2023-08-25 DIAGNOSIS — I6523 Occlusion and stenosis of bilateral carotid arteries: Secondary | ICD-10-CM

## 2023-08-25 DIAGNOSIS — Z1152 Encounter for screening for COVID-19: Secondary | ICD-10-CM | POA: Diagnosis not present

## 2023-08-25 DIAGNOSIS — R079 Chest pain, unspecified: Secondary | ICD-10-CM | POA: Diagnosis not present

## 2023-08-25 DIAGNOSIS — I2510 Atherosclerotic heart disease of native coronary artery without angina pectoris: Secondary | ICD-10-CM

## 2023-08-25 DIAGNOSIS — Z8249 Family history of ischemic heart disease and other diseases of the circulatory system: Secondary | ICD-10-CM | POA: Diagnosis not present

## 2023-08-25 DIAGNOSIS — F17290 Nicotine dependence, other tobacco product, uncomplicated: Secondary | ICD-10-CM | POA: Diagnosis present

## 2023-08-25 DIAGNOSIS — C189 Malignant neoplasm of colon, unspecified: Secondary | ICD-10-CM | POA: Insufficient documentation

## 2023-08-25 DIAGNOSIS — I452 Bifascicular block: Secondary | ICD-10-CM | POA: Diagnosis present

## 2023-08-25 DIAGNOSIS — I110 Hypertensive heart disease with heart failure: Secondary | ICD-10-CM | POA: Diagnosis present

## 2023-08-25 DIAGNOSIS — J9601 Acute respiratory failure with hypoxia: Secondary | ICD-10-CM | POA: Diagnosis present

## 2023-08-25 DIAGNOSIS — Z9049 Acquired absence of other specified parts of digestive tract: Secondary | ICD-10-CM | POA: Diagnosis not present

## 2023-08-25 DIAGNOSIS — C7800 Secondary malignant neoplasm of unspecified lung: Secondary | ICD-10-CM | POA: Diagnosis present

## 2023-08-25 DIAGNOSIS — Z79890 Hormone replacement therapy (postmenopausal): Secondary | ICD-10-CM | POA: Diagnosis not present

## 2023-08-25 DIAGNOSIS — E871 Hypo-osmolality and hyponatremia: Secondary | ICD-10-CM | POA: Diagnosis present

## 2023-08-25 DIAGNOSIS — J440 Chronic obstructive pulmonary disease with acute lower respiratory infection: Secondary | ICD-10-CM | POA: Diagnosis present

## 2023-08-25 DIAGNOSIS — J189 Pneumonia, unspecified organism: Secondary | ICD-10-CM | POA: Diagnosis present

## 2023-08-25 DIAGNOSIS — I5043 Acute on chronic combined systolic (congestive) and diastolic (congestive) heart failure: Secondary | ICD-10-CM | POA: Diagnosis not present

## 2023-08-25 DIAGNOSIS — D869 Sarcoidosis, unspecified: Secondary | ICD-10-CM | POA: Diagnosis present

## 2023-08-25 DIAGNOSIS — E7800 Pure hypercholesterolemia, unspecified: Secondary | ICD-10-CM

## 2023-08-25 DIAGNOSIS — I10 Essential (primary) hypertension: Secondary | ICD-10-CM

## 2023-08-25 DIAGNOSIS — Z85038 Personal history of other malignant neoplasm of large intestine: Secondary | ICD-10-CM | POA: Diagnosis not present

## 2023-08-25 LAB — RESPIRATORY PANEL BY PCR

## 2023-08-25 LAB — CBC WITH DIFFERENTIAL/PLATELET
Abs Immature Granulocytes: 0.03 10*3/uL (ref 0.00–0.07)
Basophils Absolute: 0 10*3/uL (ref 0.0–0.1)
Basophils Relative: 0 %
Eosinophils Absolute: 0 10*3/uL (ref 0.0–0.5)
Eosinophils Relative: 0 %
HCT: 37.4 % — ABNORMAL LOW (ref 39.0–52.0)
Hemoglobin: 12 g/dL — ABNORMAL LOW (ref 13.0–17.0)
Immature Granulocytes: 0 %
Lymphocytes Relative: 1 %
Lymphs Abs: 0.1 10*3/uL — ABNORMAL LOW (ref 0.7–4.0)
MCH: 32.6 pg (ref 26.0–34.0)
MCHC: 32.1 g/dL (ref 30.0–36.0)
MCV: 101.6 fL — ABNORMAL HIGH (ref 80.0–100.0)
Monocytes Absolute: 0.6 10*3/uL (ref 0.1–1.0)
Monocytes Relative: 5 %
Neutro Abs: 9.7 10*3/uL — ABNORMAL HIGH (ref 1.7–7.7)
Neutrophils Relative %: 94 %
Platelets: 147 10*3/uL — ABNORMAL LOW (ref 150–400)
RBC: 3.68 MIL/uL — ABNORMAL LOW (ref 4.22–5.81)
RDW: 16.1 % — ABNORMAL HIGH (ref 11.5–15.5)
WBC: 10.4 10*3/uL (ref 4.0–10.5)
nRBC: 0 % (ref 0.0–0.2)

## 2023-08-25 LAB — COMPREHENSIVE METABOLIC PANEL
ALT: 32 U/L (ref 0–44)
AST: 47 U/L — ABNORMAL HIGH (ref 15–41)
Albumin: 3 g/dL — ABNORMAL LOW (ref 3.5–5.0)
Alkaline Phosphatase: 235 U/L — ABNORMAL HIGH (ref 38–126)
Anion gap: 11 (ref 5–15)
BUN: 22 mg/dL (ref 8–23)
CO2: 22 mmol/L (ref 22–32)
Calcium: 8.7 mg/dL — ABNORMAL LOW (ref 8.9–10.3)
Chloride: 96 mmol/L — ABNORMAL LOW (ref 98–111)
Creatinine, Ser: 1.44 mg/dL — ABNORMAL HIGH (ref 0.61–1.24)
GFR, Estimated: 49 mL/min — ABNORMAL LOW (ref >60)
Glucose, Bld: 331 mg/dL — ABNORMAL HIGH (ref 70–99)
Potassium: 4.1 mmol/L (ref 3.5–5.1)
Sodium: 129 mmol/L — ABNORMAL LOW (ref 135–145)
Total Bilirubin: 1 mg/dL (ref 0.0–1.2)
Total Protein: 6.4 g/dL — ABNORMAL LOW (ref 6.5–8.1)

## 2023-08-25 LAB — TSH: TSH: 1.278 u[IU]/mL (ref 0.350–4.500)

## 2023-08-25 LAB — MAGNESIUM: Magnesium: 2.3 mg/dL (ref 1.7–2.4)

## 2023-08-25 LAB — PROCALCITONIN: Procalcitonin: 1.21 ng/mL

## 2023-08-25 LAB — LACTATE DEHYDROGENASE: LDH: 198 U/L — ABNORMAL HIGH (ref 98–192)

## 2023-08-25 LAB — STREP PNEUMONIAE URINARY ANTIGEN: Strep Pneumo Urinary Antigen: NEGATIVE

## 2023-08-25 MED ORDER — ARFORMOTEROL TARTRATE 15 MCG/2ML IN NEBU
15.0000 ug | INHALATION_SOLUTION | Freq: Two times a day (BID) | RESPIRATORY_TRACT | Status: DC
  Administered 2023-08-25 – 2023-08-26 (×2): 15 ug via RESPIRATORY_TRACT
  Filled 2023-08-25 (×2): qty 2

## 2023-08-25 MED ORDER — FUROSEMIDE 10 MG/ML IJ SOLN
40.0000 mg | Freq: Once | INTRAMUSCULAR | Status: AC
  Administered 2023-08-25: 40 mg via INTRAVENOUS
  Filled 2023-08-25: qty 4

## 2023-08-25 MED ORDER — LEVOTHYROXINE SODIUM 50 MCG PO TABS
50.0000 ug | ORAL_TABLET | Freq: Every day | ORAL | Status: DC
  Administered 2023-08-25 – 2023-08-26 (×2): 50 ug via ORAL
  Filled 2023-08-25: qty 1
  Filled 2023-08-25: qty 2

## 2023-08-25 MED ORDER — CHLORHEXIDINE GLUCONATE CLOTH 2 % EX PADS
6.0000 | MEDICATED_PAD | Freq: Every day | CUTANEOUS | Status: DC
  Administered 2023-08-25 – 2023-08-26 (×2): 6 via TOPICAL

## 2023-08-25 MED ORDER — IPRATROPIUM-ALBUTEROL 0.5-2.5 (3) MG/3ML IN SOLN: 3.0000 mL | RESPIRATORY_TRACT | Status: DC | PRN

## 2023-08-25 MED ORDER — GABAPENTIN 300 MG PO CAPS: 300.0000 mg | ORAL_CAPSULE | Freq: Every day | ORAL | Status: DC | PRN

## 2023-08-25 MED ORDER — IPRATROPIUM-ALBUTEROL 0.5-2.5 (3) MG/3ML IN SOLN
3.0000 mL | Freq: Four times a day (QID) | RESPIRATORY_TRACT | Status: DC
  Administered 2023-08-25 – 2023-08-26 (×2): 3 mL via RESPIRATORY_TRACT
  Filled 2023-08-25 (×3): qty 3

## 2023-08-25 MED ORDER — CLOPIDOGREL BISULFATE 75 MG PO TABS
75.0000 mg | ORAL_TABLET | Freq: Every day | ORAL | Status: DC
Start: 2023-08-25 — End: 2023-08-26
  Administered 2023-08-25 – 2023-08-26 (×2): 75 mg via ORAL
  Filled 2023-08-25 (×3): qty 1

## 2023-08-25 MED ORDER — CHLORHEXIDINE GLUCONATE CLOTH 2 % EX PADS: 6.0000 | MEDICATED_PAD | Freq: Every day | CUTANEOUS | Status: DC

## 2023-08-25 MED ORDER — MAGNESIUM OXIDE -MG SUPPLEMENT 400 (240 MG) MG PO TABS
400.0000 mg | ORAL_TABLET | Freq: Every day | ORAL | Status: DC
  Administered 2023-08-25 – 2023-08-26 (×2): 400 mg via ORAL
  Filled 2023-08-25 (×2): qty 1

## 2023-08-25 MED ORDER — SODIUM CHLORIDE 0.9 % IV SOLN
2.0000 g | INTRAVENOUS | Status: DC
  Administered 2023-08-25 – 2023-08-26 (×2): 2 g via INTRAVENOUS
  Filled 2023-08-25 (×2): qty 20

## 2023-08-25 MED ORDER — ACETAMINOPHEN 650 MG RE SUPP: 650.0000 mg | Freq: Four times a day (QID) | RECTAL | Status: DC | PRN

## 2023-08-25 MED ORDER — SODIUM CHLORIDE 0.9% FLUSH
3.0000 mL | Freq: Two times a day (BID) | INTRAVENOUS | Status: DC
  Administered 2023-08-25 – 2023-08-26 (×3): 3 mL via INTRAVENOUS

## 2023-08-25 MED ORDER — RANOLAZINE ER 500 MG PO TB12
500.0000 mg | ORAL_TABLET | Freq: Two times a day (BID) | ORAL | Status: DC
  Administered 2023-08-25 – 2023-08-26 (×3): 500 mg via ORAL
  Filled 2023-08-25 (×4): qty 1

## 2023-08-25 MED ORDER — ENOXAPARIN SODIUM 40 MG/0.4ML IJ SOSY
40.0000 mg | PREFILLED_SYRINGE | INTRAMUSCULAR | Status: DC
  Administered 2023-08-25: 40 mg via SUBCUTANEOUS
  Filled 2023-08-25: qty 0.4

## 2023-08-25 MED ORDER — ONDANSETRON HCL 4 MG PO TABS
4.0000 mg | ORAL_TABLET | Freq: Four times a day (QID) | ORAL | Status: DC | PRN
Start: 2023-08-25 — End: 2023-08-26

## 2023-08-25 MED ORDER — METOPROLOL SUCCINATE ER 50 MG PO TB24
50.0000 mg | ORAL_TABLET | Freq: Every evening | ORAL | Status: DC
  Administered 2023-08-25: 50 mg via ORAL
  Filled 2023-08-25: qty 1

## 2023-08-25 MED ORDER — EZETIMIBE 10 MG PO TABS
10.0000 mg | ORAL_TABLET | Freq: Every day | ORAL | Status: DC
  Administered 2023-08-25 – 2023-08-26 (×2): 10 mg via ORAL
  Filled 2023-08-25 (×3): qty 1

## 2023-08-25 MED ORDER — ROSUVASTATIN CALCIUM 20 MG PO TABS
20.0000 mg | ORAL_TABLET | Freq: Every day | ORAL | Status: DC
Start: 2023-08-25 — End: 2023-08-26
  Administered 2023-08-25 – 2023-08-26 (×2): 20 mg via ORAL
  Filled 2023-08-25 (×3): qty 1

## 2023-08-25 MED ORDER — ONDANSETRON HCL 4 MG/2ML IJ SOLN: 4.0000 mg | Freq: Four times a day (QID) | INTRAMUSCULAR | Status: DC | PRN

## 2023-08-25 MED ORDER — ACETAMINOPHEN 325 MG PO TABS: 650.0000 mg | ORAL_TABLET | Freq: Four times a day (QID) | ORAL | Status: DC | PRN

## 2023-08-25 MED ORDER — PREDNISONE 20 MG PO TABS
40.0000 mg | ORAL_TABLET | Freq: Every day | ORAL | Status: DC
  Administered 2023-08-25 – 2023-08-26 (×2): 40 mg via ORAL
  Filled 2023-08-25 (×2): qty 2

## 2023-08-25 MED ORDER — BUDESONIDE 0.5 MG/2ML IN SUSP
0.5000 mg | Freq: Two times a day (BID) | RESPIRATORY_TRACT | Status: DC
  Administered 2023-08-25 – 2023-08-26 (×2): 0.5 mg via RESPIRATORY_TRACT
  Filled 2023-08-25 (×2): qty 2

## 2023-08-25 MED ORDER — GUAIFENESIN ER 600 MG PO TB12
600.0000 mg | ORAL_TABLET | Freq: Two times a day (BID) | ORAL | Status: DC
  Administered 2023-08-25 – 2023-08-26 (×3): 600 mg via ORAL
  Filled 2023-08-25 (×3): qty 1

## 2023-08-25 NOTE — ED Notes (Signed)
 [facility] called for Ed to Ed tx, accepting physician, Glendora Score, MD

## 2023-08-25 NOTE — H&P (Incomplete)
 History and Physical    Patient: Luis [name] ZOX:[id number] DOB: April 16, 1941 DOA: 08/24/2023 DOS: the patient was seen and examined on 08/25/2023 PCP: Ailene Ravel, MD  Patient coming from: Transfer from [facility]  Chief Complaint:  Chief Complaint  Patient presents with   Chest Pain   HPI: Luis [name] is a 83 y.o. male with medical history significant of colon cancer, sarcoidosis, COPD, hypertension, hyperlipidemia, CAD, with chest pain and shortness of breath.  He has been experiencing sharp chest pain for three days, described as more acute than previous episodes. The pain spreads across both sides of the chest, and is exacerbated by deep breathing and exertion. He attempted to alleviate the pain with nitroglycerin tablets, taking three in total, but found no relief. He visited the emergency room in [place], [place], where an EKG, CT scan, and chest x-ray were performed, ruling out a myocardial infarction. He was advised to return to his hometown for further evaluation.  He experiences shortness of breath, particularly when inhaling, which worsens the chest pain. Since receiving treatment at the emergency room, including steroids, he feels about ninety percent better. The chest pain now occurs only with deep breaths or exertion and is nearly absent at rest. No wheezing is reported, but he has had some cough, primarily in the morning, which he attributes to postnasal drip. The cough has improved since the initial presentation.  He was diagnosed with pneumonia at the emergency room after a chest scan. Symptoms include cough, shortness of breath, and chest pain, with improvement noted after administration of steroids and nebulizer treatments. No wheezing is reported, and the cough is productive, especially in the morning.  He has a history of COPD and uses Breztri in the morning and evening, along with a rescue inhaler and a nebulizer containing albuterol. He notes that the  nebulizer treatment has been helpful. No recent exacerbations of COPD have been reported, and current respiratory symptoms are attributed to pneumonia rather than a COPD exacerbation.  He has a past medical history of liver cancer, for which he underwent partial resection and ablation three years ago. The cancer was in remission for a year but was detected again on a scan, leading to recent radiation treatments at [facility].  He mentions a history of leg swelling a couple of months ago, for which he took Lasix. The swelling has since resolved, and he has not taken Lasix for a couple of weeks.  In the emergency department patient was noted to be afebrile with respirations 16-25, blood pressures maintained, and O2 saturations reported to be as low as 86% on room air with improvement on 2 to 3 L of nasal cannula oxygen.  Labs from 3/14 noted WBC 8.8, hemoglobin 12, MCV of 100.8, sodium 131, calcium 8.5, D-dimer 2.91, BNP 179.7, and high-sensitivity troponins negative x 2.  Initial chest x-ray noted no active disease with chronic cardiomegaly and chronic markings from previous surgery of the right lower lobe.  Influenza, COVID-19, and RSV screening were negative.  CT angiogram of the chest with contrast have been obtained which revealed no signs of pulmonary embolism, diffuse bronchial wall thickening and intralobar septal thickening concerning for possible atypical infection or edema, small right pleural effusion with right lower lobe volume loss/atelectasis, enlarged right diaphragmatic lymph nodes concerning for metastasis, subpleural consolidation in the left lower lobe measuring 2.9 x 1.6 cm favored to be round atelectasis, and new vague hypoattenuating lesion in the posterior right hepatic lobe surrounding small volume free fluid  concerning for metastatic disease, and small pericardial effusion.  Blood cultures had been obtained.  Patient was given several DuoNeb breathing treatments, Solu-Medrol 125 mg IV,  Rocephin, and azithromycin.     Review of Systems: As mentioned in the history of present illness. All other systems reviewed and are negative. 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 08/2014 Cath/PCI: LM nl, LAD 30p, D1 95 (2.25x12 Resolute Integrity DES), LCX small, RI 100 (attempted PCI) - branches fill via L->L collats, RCA dominant, nl, RPDA/PLA nl, EF 60^. b. 02/24/2015 PCI CTO of Ramus DES x2.   Chronic bronchitis (HCC)    hx   Diverticulosis 05/2005   Elevated lipase    Fuchs' corneal dystrophy    History of adenomatous polyp of colon 05/2005   8 mm adenoma   History of blood transfusion    "related to some of my surgeries"   Hyperlipidemia    Hypertension    Iron deficiency anemia due to chronic blood loss 06/21/2017   Pneumonia    Prostate cancer (HCC) 2011   S/P seed implant   Sarcoidosis    Thrombocytopenia (HCC)    a. Noted on prior labs, unclear of what w/u done.   Trifascicular block    Past Surgical History:  Procedure Laterality Date   BASAL CELL CARCINOMA EXCISION Left    shoulder   CARDIAC CATHETERIZATION N/A 02/24/2015   Procedure: Coronary/Bypass Graft CTO Intervention;  Surgeon: Corky Crafts, MD;  Location: [facility] INVASIVE CV LAB;  Service: Cardiovascular;  Laterality: N/A;   CARDIAC CATHETERIZATION  02/24/2015   Procedure: Coronary/Graft Atherectomy;  Surgeon: Corky Crafts, MD;  Location: [facility] INVASIVE CV LAB;  Service: Cardiovascular;;   CATARACT EXTRACTION W/ INTRAOCULAR LENS  IMPLANT, BILATERAL Bilateral ~ 2011-2012   COLONOSCOPY W/ POLYPECTOMY  06/08/2005 and 10/18/2010   8 mm adenoma 2006, none 2012. Diverticulosis and internal hemorrhoids.   CORNEAL TRANSPLANT Bilateral ~  2011-2012   "@ same time as cataract OR"   CORONARY ANGIOPLASTY WITH STENT PLACEMENT  08/2014; 02/24/2015   "1 stent + 1 stent"   CORONARY STENT INTERVENTION N/A 01/02/2019   Procedure: CORONARY STENT INTERVENTION;  Surgeon: Corky Crafts, MD;  Location: [facility] INVASIVE CV LAB;  Service: Cardiovascular;  Laterality: N/A;   EYE SURGERY     FINGER SURGERY Right 2014   "reattached middle finger"   INSERTION PROSTATE RADIATION SEED  ~ 2011   IR IMAGING GUIDED PORT INSERTION  03/04/2019   IR RADIOLOGIST EVAL & MGMT  05/26/2020   IR US GUIDE BX ASP/DRAIN  03/04/2019   LAPAROSCOPIC CHOLECYSTECTOMY  2015   LAPAROSCOPIC PARTIAL COLECTOMY N/A 07/26/2017   Procedure: LAPAROSCOPIC PARTIAL COLECTOMY, EXCISION SCROTAL CYST;  Surgeon: Gaynelle Adu, MD;  Location: [facility] ORS;  Service: General;  Laterality: N/A;   LEFT HEART CATH AND CORONARY ANGIOGRAPHY N/A 01/02/2019   Procedure: LEFT HEART CATH AND CORONARY ANGIOGRAPHY;  Surgeon: Corky Crafts, MD;  Location: [facility] INVASIVE CV LAB;  Service: Cardiovascular;  Laterality: N/A;   LEFT HEART CATHETERIZATION WITH CORONARY ANGIOGRAM N/A 08/12/2014   Procedure: LEFT HEART CATHETERIZATION WITH CORONARY ANGIOGRAM;  Surgeon: Micheline Chapman, MD;  Location: [facility] CATH LAB;  Service: Cardiovascular;  Laterality: N/A;  LYMPH NODE BIOPSY     mid chest   MOHS SURGERY Right ~ 2011   "eyelid; for basal cell"   RADIOLOGY WITH ANESTHESIA N/A 06/30/2020   Procedure: IR WITH ANESTHESIA MICROWAVE ABLATION;  Surgeon: Gilmer Mor, DO;  Location: [facility] ORS;  Service: Anesthesiology;  Laterality: N/A;   RIGHT/LEFT HEART CATH AND CORONARY ANGIOGRAPHY N/A 04/25/2022   Procedure: RIGHT/LEFT HEART CATH AND CORONARY ANGIOGRAPHY;  Surgeon: Corky Crafts, MD;  Location: [facility] INVASIVE CV LAB;  Service: Cardiovascular;  Laterality: N/A;   THORACENTESIS N/A 02/11/2021   Procedure: Alanson Puls;  Surgeon: Karren Burly, MD;  Location: [facility] ENDOSCOPY;  Service: Pulmonary;  Laterality:  N/A;   THORACENTESIS Right 04/04/2021   Procedure: THORACENTESIS;  Surgeon: Lorin Glass, MD;  Location: [facility] ENDOSCOPY;  Service: Pulmonary;  Laterality: Right;   THORACENTESIS Right 08/16/2021   Procedure: THORACENTESIS;  Surgeon: Cheri Fowler, MD;  Location: [facility] ENDOSCOPY;  Service: Pulmonary;  Laterality: Right;   THORACENTESIS Right 11/29/2021   Procedure: THORACENTESIS;  Surgeon: Martina Sinner, MD;  Location: [facility] ENDOSCOPY;  Service: Pulmonary;  Laterality: Right;   THORACENTESIS N/A 03/17/2022   Procedure: Alanson Puls;  Surgeon: Karren Burly, MD;  Location: [facility] ENDOSCOPY;  Service: Pulmonary;  Laterality: N/A;   THORACENTESIS N/A 07/14/2022   Procedure: Alanson Puls;  Surgeon: Josephine Igo, DO;  Location: [facility] ENDOSCOPY;  Service: Pulmonary;  Laterality: N/A;   THORACENTESIS N/A 10/11/2022   Procedure: Alanson Puls;  Surgeon: Cheri Fowler, MD;  Location: [facility] ENDOSCOPY;  Service: Pulmonary;  Laterality: N/A;   THOROCOTOMY WITH LOBECTOMY Right ~ 1991   partial removal right lung   TONSILLECTOMY  ~ 1950   Social History:  reports that he has been smoking cigars and cigarettes. He has never used smokeless tobacco. He reports current alcohol use of about 2.0 standard drinks of alcohol per week. He reports that he does not use drugs.  No Known Allergies  Family History  Problem Relation Age of Onset   Heart disease Father 10       Died from "hardening of the arteries" age 40   Coronary artery disease Sister 58   Heart attack Sister    Colon cancer Neg Hx    Throat cancer Neg Hx    Pancreatic cancer Neg Hx    Prostate cancer Neg Hx    Stroke Neg Hx    Hypertension Neg Hx     Prior to Admission medications   Medication Sig Start Date End Date Taking? Authorizing Provider  acetaminophen (TYLENOL) 500 MG tablet Take 500-1,000 mg by mouth every 6 (six) hours as needed for moderate pain.    [provider]  albuterol (PROVENTIL) (2.5 MG/3ML) 0.083% nebulizer  solution INHALE 3 ML BY NEBULIZATION EVERY 6 HOURS AS NEEDED FOR WHEEZING OR SHORTNESS OF BREATH 02/14/23   Cobb, Ruby Cola, NP  albuterol (VENTOLIN HFA) 108 (90 Base) MCG/ACT inhaler INHALE 1-2 PUFFS BY MOUTH EVERY 6 HOURS AS NEEDED FOR WHEEZE OR SHORTNESS OF BREATH 07/03/22   Hunsucker, Lesia Sago, MD  BREZTRI AEROSPHERE 160-9-4.8 MCG/ACT AERO INHALE 2 PUFFS INTO THE LUNGS IN THE MORNING AND AT BEDTIME. RINSE MOUTH AFTER USE. 04/01/23   Hunsucker, Lesia Sago, MD  Cholecalciferol (VITAMIN D3) 2000 units capsule Take 2,000 Units by mouth daily.    [provider]  clopidogrel (PLAVIX) 75 MG tablet Take 1 tablet (75 mg total) by mouth daily. 05/01/23   Tereso Newcomer T, PA-C  ezetimibe (ZETIA) 10 MG tablet TAKE 1 TABLET BY MOUTH  EVERY DAY 04/02/23   Swinyer, Zachary George, NP  fluticasone (FLONASE) 50 MCG/ACT nasal spray SPRAY 2 SPRAYS INTO EACH NOSTRIL EVERY DAY 06/13/22   Hunsucker, Lesia Sago, MD  furosemide (LASIX) 20 MG tablet Take 20 mg by mouth daily as needed for fluid or edema.    [provider]  gabapentin (NEURONTIN) 300 MG capsule Take 300 mg by mouth daily as needed (neuropathy).    [provider]  ibuprofen (ADVIL) 200 MG tablet Take 200-400 mg by mouth every 6 (six) hours as needed for moderate pain or mild pain.    [provider]  levothyroxine (SYNTHROID) 50 MCG tablet Take 50 mcg by mouth daily. 02/20/23   [provider]  metoprolol succinate (TOPROL-XL) 50 MG 24 hr tablet TAKE 1 TABLET (50 MG TOTAL) BY MOUTH EVERY EVENING. TAKE WITH OR IMMEDIATELY FOLLOWING A MEAL. 11/07/22   Swinyer, Zachary George, NP  Multiple Vitamins-Minerals (VITRUM SENIOR) TABS Take 1 tablet by mouth daily.    [provider]  naproxen sodium (ALEVE) 220 MG tablet Take 440 mg by mouth 2 (two) times daily as needed (pain).    [provider]  nitroGLYCERIN (NITROSTAT) 0.4 MG SL tablet PLACE 1 TABLET UNDER THE TONGUE EVERY 5 MINUTES AS NEEDED FOR CHEST PAIN FOR 3  DOSES 08/15/23   Tereso Newcomer T, PA-C  ranolazine (RANEXA) 500 MG 12 hr tablet Take 1 tablet (500 mg total) by mouth 2 (two) times daily. 10/16/22   Corky Crafts, MD  rosuvastatin (CRESTOR) 20 MG tablet Take 1 tablet (20 mg total) by mouth daily. 05/01/23   Tereso Newcomer T, PA-C  traMADol (ULTRAM) 50 MG tablet Take 2 tablets (100 mg total) by mouth every 6 (six) hours as needed. 05/29/21   Emelia Loron, MD    Physical Exam: Vitals:   08/25/23 0730 08/25/23 0815 08/25/23 0858 08/25/23 0915  BP: (!) 145/80     Pulse: 79 85  90  Resp: 16 (!) 25  (!) 24  Temp:   98.3 F (36.8 C)   TempSrc:   Oral   SpO2: 98% 95%  95%   Constitutional: Elderly male currently in no acute distress at this time. Eyes: PERRL, lids and conjunctivae normal ENMT: Mucous membranes are moist. Posterior pharynx clear of any exudate or lesions.Normal dentition.  Neck: normal, supple, no masses, no thyromegaly Respiratory: Decreased overall aeration with mild expiratory wheeze appreciated.  No significant rhonchi noted. Cardiovascular: Regular rate and rhythm, no murmurs / rubs / gallops. No extremity edema.  Abdomen: no tenderness, no masses palpated. No hepatosplenomegaly. Bowel sounds positive.  Musculoskeletal: no clubbing / cyanosis. No joint deformity upper and lower extremities. Good ROM, no contractures. Normal muscle tone.  Skin: no rashes, lesions, ulcers.   Neurologic: CN 2-12 grossly intact.  Strength 5/5 in all 4.  Psychiatric: Normal judgment and insight. Alert and oriented x 3. Normal mood.   Data Reviewed: {Tip this will not be part of the note when signed- Document your independent interpretation of telemetry tracing, EKG, lab, Radiology test or any other diagnostic tests. Add any new diagnostic test ordered today. (Optional):[id number]} EKG revealed a sinus rhythm with first-degree AV block at 69 bpm with RBBB, and left anterior fascicular block that was otherwise similar to prior..  Reviewed  labs, imaging, and pertinent records as documented.  Assessment and Plan:  Acute respiratory failure with hypoxia Possible community-acquired pneumonia versus COPD exacerbation O2 saturations noted to be as low as 86% on room air.  Patient had been placed on 2 L of nasal cannula oxygen with improvement greater than 90%.  Patient was noted to have elevated D-dimer of 2.91 which CTA of the chest was obtained.  CTA of the chest noted diffuse bronchial wall thickening and intralobar septal thickening concerning for possible atypical infection or edema,  small right pleural effusion with right lower lobe volume loss/atelectasis, and no signs of a pulmonary embolism.   Influenza, COVID-19, and RSV screening were negative. -Admit to a cardiac telemetry bed -Continuous pulse oximetry with nasal cannula oxygen to maintain O2 saturation greater than 90% -Incentive spirometry and flutter valve -Strict intake and output -Check complete respiratory virus panel -Check procalcitonin -Check sputum culture once able -Continue empiric antibiotics of Rocephin and azithromycin.  De-escalate when medically appropriate. -DuoNebs -Brovana and budesonide nebs -Mucinex  Heart failure with preserved ejection fraction Possibly acute on chronic.  Patient appears clinically euvolemic with only trace lower extremity edema on prior physical exam.  BNP was noted to be mildly elevated at 179.7.  He reports that he does not take Lasix routinely. -Strict IO's and daily weights -Give Lasix 40 mg IV x 1 dose, reassess fluid status in a.m.  Chest pain  CAD Acute.  Patient presents with 3-day history of chest pain.  High-sensitivity troponins were negative x 2 and when checked a couple days ago were also negative..  EKG revealed bifascicular block seen on previous tracings.  History of DES to diagonal vessel 2016. CT of PCI with overlapping DES x2 to ramus 2016. Successful PCI of LAD diagonal bifurcation, balloon angioplasty  first diagonal lesion, patent stents 12/2018.  Last cardiac cath 04/25/2022 noted widely patent stents.Lower suspicion for cardiac cause of symptoms at this time. -Continue Plavix, and statin -Check echocardiogram -Formally consult cardiology if noted to have significant change in overall heart function and  Hyponatremia Acute.  Sodium noted to be 131.  Possibly hypervolemic hyponatremia given elevated BNP -Continue to monitor  Adenocarcinoma of the colon Lymphadenopathy Patient has known metastatic adenocarcinoma of the cecum initially diagnosed in 2019.  Patient has ventral with chemotherapy, resection, and SBRT.  CTA of the chest noted concern for enlarged right diaphragmatic lymph nodes concerning for metastasis, subpleural consolidation in the left lower lobe measuring 2.9 x 1.6 cm favored to be round atelectasis, and new vague hypoattenuating lesion in the posterior right hepatic lobe surrounding small volume free fluid concerning for metastatic disease.  Records note patient has been undergoing monitoring due to history of colon cancer.  Records note CEA levels have been stable.     Patient reports having-Check LDH -Continue outpatient follow-up at [facility]  Essential hypertension Blood pressures noted to be 104/68-145/80. -Continue metoprolol  Carotid artery disease: Known occlusion of right CCA and ICA, left ICA 1 to 39% stenosis on carotid duplex 05/2021.  Hyperlipidemia -Continue Crestor and Zetia  Hypothyroidism Last available TSH noted to be 6.77 when checked 02/16/2023. -Check TSH -Continue levothyroxine  DVT prophylaxis: Lovenox Advance Care Planning:   Code Status: Full Code    Consults: None  Family Communication: ***  Severity of Illness: The appropriate patient status for this patient is INPATIENT. Inpatient status is judged to be reasonable and necessary in order to provide the required intensity of service to ensure the patient's safety. The patient's presenting  symptoms, physical exam findings, and initial radiographic and laboratory data in the context of their chronic comorbidities is felt to place them at high risk for further clinical deterioration. Furthermore, it is not anticipated  that the patient will be medically stable for discharge from the hospital within 2 midnights of admission.   * I certify that at the point of admission it is my clinical judgment that the patient will require inpatient hospital care spanning beyond 2 midnights from the point of admission due to high intensity of service, high risk for further deterioration and high frequency of surveillance required.*  Author: Clydie Braun, MD 08/25/2023 10:43 AM  For on call review [email or web address].

## 2023-08-25 NOTE — ED Notes (Signed)
 Attempted to call report to [facility] ED CN, no answer. Will call back. [facility] is in dept preparing pt for transfer.

## 2023-08-25 NOTE — ED Notes (Signed)
 Attempted to call report again, no answer again. Patient is en route to [facility] w/ [facility]

## 2023-08-25 NOTE — ED Notes (Signed)
 Called Bed Placement, spoke to [facility]; stated they're awaiting discharges. Informed to contact her in the case of an Ed to Ed tx, so she can speak with the [facility]

## 2023-08-26 ENCOUNTER — Inpatient Hospital Stay (HOSPITAL_COMMUNITY)

## 2023-08-26 DIAGNOSIS — R079 Chest pain, unspecified: Secondary | ICD-10-CM | POA: Diagnosis not present

## 2023-08-26 DIAGNOSIS — J9601 Acute respiratory failure with hypoxia: Secondary | ICD-10-CM | POA: Diagnosis not present

## 2023-08-26 LAB — ECHOCARDIOGRAM COMPLETE
AR max vel: 1.6 cm2
AV Peak grad: 9.6 mmHg
Ao pk vel: 1.55 m/s
Area-P 1/2: 2.99 cm2
Height: 70.5 in
S' Lateral: 3.4 cm
Weight: 3646.4 [oz_av]

## 2023-08-26 LAB — CBC
HCT: 35.3 % — ABNORMAL LOW (ref 39.0–52.0)
Hemoglobin: 11.8 g/dL — ABNORMAL LOW (ref 13.0–17.0)
MCH: 32.7 pg (ref 26.0–34.0)
MCHC: 33.4 g/dL (ref 30.0–36.0)
MCV: 97.8 fL (ref 80.0–100.0)
Platelets: 173 10*3/uL (ref 150–400)
RBC: 3.61 MIL/uL — ABNORMAL LOW (ref 4.22–5.81)
RDW: 16 % — ABNORMAL HIGH (ref 11.5–15.5)
WBC: 11.9 10*3/uL — ABNORMAL HIGH (ref 4.0–10.5)
nRBC: 0 % (ref 0.0–0.2)

## 2023-08-26 LAB — BASIC METABOLIC PANEL
Anion gap: 10 (ref 5–15)
BUN: 32 mg/dL — ABNORMAL HIGH (ref 8–23)
CO2: 23 mmol/L (ref 22–32)
Calcium: 8.7 mg/dL — ABNORMAL LOW (ref 8.9–10.3)
Chloride: 99 mmol/L (ref 98–111)
Creatinine, Ser: 1.28 mg/dL — ABNORMAL HIGH (ref 0.61–1.24)
GFR, Estimated: 56 mL/min — ABNORMAL LOW (ref >60)
Glucose, Bld: 243 mg/dL — ABNORMAL HIGH (ref 70–99)
Potassium: 4.5 mmol/L (ref 3.5–5.1)
Sodium: 132 mmol/L — ABNORMAL LOW (ref 135–145)

## 2023-08-26 LAB — EXPECTORATED SPUTUM ASSESSMENT W GRAM STAIN, RFLX TO RESP C

## 2023-08-26 LAB — MAGNESIUM: Magnesium: 2.4 mg/dL (ref 1.7–2.4)

## 2023-08-26 MED ORDER — HEPARIN SOD (PORK) LOCK FLUSH 100 UNIT/ML IV SOLN
500.0000 [IU] | INTRAVENOUS | Status: AC | PRN
  Administered 2023-08-26: 500 [IU]

## 2023-08-26 MED ORDER — AMOXICILLIN-POT CLAVULANATE 875-125 MG PO TABS: 1.0000 | ORAL_TABLET | Freq: Two times a day (BID) | ORAL | 0 refills | Status: AC

## 2023-08-26 MED ORDER — FUROSEMIDE 10 MG/ML IJ SOLN
20.0000 mg | Freq: Once | INTRAMUSCULAR | Status: AC
  Administered 2023-08-26: 20 mg via INTRAVENOUS
  Filled 2023-08-26: qty 2

## 2023-08-26 MED ORDER — PREDNISONE 50 MG PO TABS: 50.0000 mg | ORAL_TABLET | Freq: Every day | ORAL | 0 refills | Status: AC

## 2023-08-26 MED ORDER — SODIUM CHLORIDE 0.9 % IV BOLUS
500.0000 mL | Freq: Once | INTRAVENOUS | Status: AC
  Administered 2023-08-26: 500 mL via INTRAVENOUS

## 2023-08-26 NOTE — Progress Notes (Signed)
 Echocardiogram 2D Echocardiogram has been performed.  Luis [name] 08/26/2023, 3:56 PM

## 2023-08-26 NOTE — Discharge Summary (Signed)
 Physician Discharge Summary  Luis [name] YQI:[id number] DOB: December 13, 1940 DOA: 08/24/2023  PCP: Ailene Ravel, MD  Admit date: 08/24/2023 Discharge date: 08/26/2023 30 Day Unplanned Readmission Risk Score    Flowsheet Row ED to Hosp-Admission (Current) from 08/24/2023 in [place] [facility]  30 Day Unplanned Readmission Risk Score (%) 18.74 Filed at 08/26/2023 1200       This score is the patient's risk of an unplanned readmission within 30 days of being discharged (0 -100%). The score is based on dignosis, age, lab data, medications, orders, and past utilization.   Low:  0-14.9   Medium: 15-21.9   High: 22-29.9   Extreme: 30 and above          Admitted From: Home Disposition: Home  Recommendations for Outpatient Follow-up:  Follow up with PCP in 1-2 weeks Please obtain BMP/CBC in one week Follow-up with your oncologist as scheduled on 09/06/2023 Please follow up with your PCP on the following pending results: Unresulted Labs (From admission, onward)     Start     Ordered   08/25/23 1126  Legionella Pneumophila Serogp 1 Ur Ag  Once,   R        08/25/23 1126              Home Health: None Equipment/Devices: None  Discharge Condition: Stable CODE STATUS: Full code Diet recommendation: Cardiac  Due to brief hospitalization, I have copied HPI and ED course from admitting hospitalist H&P. HPI: Luis [name] is a 83 y.o. male with medical history significant of colon cancer, sarcoidosis, COPD, hypertension, hyperlipidemia, CAD, with chest pain and shortness of breath.   He has been experiencing sharp chest pain for three days, described as more acute than previous episodes. The pain spreads across both sides of the chest, and is exacerbated by deep breathing and exertion. He attempted to alleviate the pain with nitroglycerin tablets, taking three in total, but found no relief. He visited the emergency room in [place], [place], where an EKG, CT scan, and  chest x-ray were performed, ruling out a myocardial infarction. He was advised to return to his hometown for further evaluation.   He experiences shortness of breath, particularly when inhaling, which worsens the chest pain. Since receiving treatment at the emergency room, including steroids, he feels about ninety percent better. The chest pain now occurs only with deep breaths or exertion and is nearly absent at rest. No wheezing is reported, but he has had some cough, primarily in the morning, which he attributes to postnasal drip. The cough has improved since the initial presentation.   He was diagnosed with pneumonia at the emergency room after a chest scan. Symptoms include cough, shortness of breath, and chest pain, with improvement noted after administration of steroids and nebulizer treatments. No wheezing is reported, and the cough is productive, especially in the morning.   He has a history of COPD and uses Breztri in the morning and evening, along with a rescue inhaler and a nebulizer containing albuterol. He notes that the nebulizer treatment has been helpful. No recent exacerbations of COPD have been reported, and current respiratory symptoms are attributed to pneumonia rather than a COPD exacerbation.   He has a past medical history of liver cancer, for which he underwent partial resection and ablation three years ago. The cancer was in remission for a year but was detected again on a scan, leading to recent radiation treatments at [facility].   He mentions a history of  leg swelling a couple of months ago, for which he took Lasix. The swelling has since resolved, and he has not taken Lasix for a couple of weeks.   In the emergency department patient was noted to be afebrile with respirations 16-25, blood pressures maintained, and O2 saturations reported to be as low as 86% on room air with improvement on 2 to 3 L of nasal cannula oxygen.  Labs from 3/14 noted WBC 8.8, hemoglobin 12, MCV of 100.8,  sodium 131, calcium 8.5, D-dimer 2.91, BNP 179.7, and high-sensitivity troponins negative x 2.  Initial chest x-ray noted no active disease with chronic cardiomegaly and chronic markings from previous surgery of the right lower lobe.  Influenza, COVID-19, and RSV screening were negative.  CT angiogram of the chest with contrast have been obtained which revealed no signs of pulmonary embolism, diffuse bronchial wall thickening and intralobar septal thickening concerning for possible atypical infection or edema, small right pleural effusion with right lower lobe volume loss/atelectasis, enlarged right diaphragmatic lymph nodes concerning for metastasis, subpleural consolidation in the left lower lobe measuring 2.9 x 1.6 cm favored to be round atelectasis, and new vague hypoattenuating lesion in the posterior right hepatic lobe surrounding small volume free fluid concerning for metastatic disease, and small pericardial effusion.  Blood cultures had been obtained.  Patient was given several DuoNeb breathing treatments, Solu-Medrol 125 mg IV, Rocephin, and azithromycin.   Subjective: Seen and examined this morning, he said that his shortness of breath has improved.  He further added that he still has shortness of breath but this is his usual due to his known lung metastasis and lymphadenopathy as well as COPD but much better than yesterday and yesterday it was unusual.  He had no other complaint.  Brief/Interim Summary: Patient was briefly hospitalized for the following.  Acute respiratory failure with hypoxia secondary to possible community-acquired pneumonia  and possibly COPD exacerbation: O2 saturations noted to be as low as 86% on room air.  Patient had been placed on 2 L of nasal cannula oxygen with improvement greater than 90%.  Patient was noted to have elevated D-dimer of 2.91 which CTA of the chest was obtained.  CTA of the chest noted diffuse bronchial wall thickening and intralobar septal thickening  concerning for possible atypical infection or edema,  small right pleural effusion with right lower lobe volume loss/atelectasis, and no signs of a pulmonary embolism.   Influenza, COVID-19, and RSV screening were negative.  Patient was started on Rocephin and Zithromax, respiratory viral panel and all the cultures were negative and procalcitonin was elevated at 1.21.  Started on bronchodilators.  Patient improved significantly, he is back with his usual mild shortness of breath due to COPD but weaned off of oxygen.  Felt very good and comfortable about going home.  Being discharged on 4 more days of oral prednisone and 5 days of Augmentin.   Acute on chronic heart failure with preserved ejection fraction/mild pericardial effusion Clinically he appeared euvolemic however his BNP was only slightly elevated at 179.  He was given 1 dose of Lasix 40 mg yesterday and a dose of Lasix 20 mg IV today.  Due to concern of pericardial effusion, echo was completed which was unremarkable.   History of CAD presented with chest pain, POA: Patient presents with 3-day history of chest pain.  High-sensitivity troponins were negative x 2 and when checked a couple days ago were also negative..  EKG revealed bifascicular block seen on previous tracings.  History  of DES to diagonal vessel 2016. CT of PCI with overlapping DES x2 to ramus 2016. Successful PCI of LAD diagonal bifurcation, balloon angioplasty first diagonal lesion, patent stents 12/2018.  Last cardiac cath 04/25/2022 noted widely patent stents.Lower suspicion for cardiac cause of symptoms at this time.  Continue Plavix and statin.  With normal troponins, I doubt we are going to see any wall motion abnormality.   Hyponatremia Mild and stable, presented with 131, now 132.   Adenocarcinoma of the colon Lymphadenopathy and mets to liver and lungs: Patient has known metastatic adenocarcinoma of the cecum initially diagnosed in 2019.  Patient has ventral with  chemotherapy, resection, and SBRT.  CTA of the chest noted concern for enlarged right diaphragmatic lymph nodes concerning for metastasis, subpleural consolidation in the left lower lobe measuring 2.9 x 1.6 cm favored to be round atelectasis, and new vague hypoattenuating lesion in the posterior right hepatic lobe surrounding small volume free fluid concerning for metastatic disease.  Records note patient has been undergoing monitoring due to history of colon cancer.  Records note CEA levels have been stable.  Patient states that he is aware of the liver and lung metastasis and he has seen his oncologist 2 months ago and he is coming up appointment is on 09/06/2023 and they are going to repeat CAT scan.   Essential hypertension Blood pressures noted to be 104/68-145/80. -Continue metoprolol   Carotid artery disease: Known occlusion of right CCA and ICA, left ICA 1 to 39% stenosis on carotid duplex 05/2021.   Hyperlipidemia -Continue Crestor and Zetia   Hypothyroidism Last available TSH noted to be 6.77 when checked 02/16/2023.  This time it was within normal limits.  Resume Synthroid.  Discharge plan was discussed with patient and/or family member and they verbalized understanding and agreed with it.  Discharge Diagnoses:  Principal Problem:   Acute respiratory failure with hypoxia [facility]) Active Problems:   Heart failure with reduced ejection fraction (HCC)   CAD (coronary artery disease)   Chest pain   Adenocarcinoma of cecum [facility])   Essential hypertension   Carotid stenosis   Hyperlipidemia   Hypothyroidism    Discharge Instructions   Allergies as of 08/26/2023   No Known Allergies      Medication List     TAKE these medications    acetaminophen 500 MG tablet Commonly known as: TYLENOL Take 500-1,000 mg by mouth every 6 (six) hours as needed for moderate pain.   albuterol 108 (90 Base) MCG/ACT inhaler Commonly known as: VENTOLIN HFA INHALE 1-2 PUFFS BY MOUTH EVERY 6  HOURS AS NEEDED FOR WHEEZE OR SHORTNESS OF BREATH   albuterol (2.5 MG/3ML) 0.083% nebulizer solution Commonly known as: PROVENTIL INHALE 3 ML BY NEBULIZATION EVERY 6 HOURS AS NEEDED FOR WHEEZING OR SHORTNESS OF BREATH   amoxicillin-clavulanate 875-125 MG tablet Commonly known as: AUGMENTIN Take 1 tablet by mouth 2 (two) times daily for 5 days.   Breztri Aerosphere 160-9-4.8 MCG/ACT Aero Generic drug: budeson-glycopyrrolate-formoterol INHALE 2 PUFFS INTO THE LUNGS IN THE MORNING AND AT BEDTIME. RINSE MOUTH AFTER USE.   clopidogrel 75 MG tablet Commonly known as: PLAVIX Take 1 tablet (75 mg total) by mouth daily.   ezetimibe 10 MG tablet Commonly known as: ZETIA TAKE 1 TABLET BY MOUTH EVERY DAY   fluticasone 50 MCG/ACT nasal spray Commonly known as: FLONASE SPRAY 2 SPRAYS INTO EACH NOSTRIL EVERY DAY   furosemide 20 MG tablet Commonly known as: LASIX Take 20 mg by mouth daily as needed  for fluid or edema.   gabapentin 300 MG capsule Commonly known as: NEURONTIN Take 300 mg by mouth daily as needed (neuropathy).   gabapentin 100 MG capsule Commonly known as: NEURONTIN Take 100 mg by mouth daily as needed (for pain).   ibuprofen 200 MG tablet Commonly known as: ADVIL Take 200-400 mg by mouth every 6 (six) hours as needed for moderate pain or mild pain.   levothyroxine 50 MCG tablet Commonly known as: SYNTHROID Take 50 mcg by mouth daily.   MAGNESIUM PO Take 1 tablet by mouth daily.   metoprolol succinate 50 MG 24 hr tablet Commonly known as: TOPROL-XL TAKE 1 TABLET (50 MG TOTAL) BY MOUTH EVERY EVENING. TAKE WITH OR IMMEDIATELY FOLLOWING A MEAL.   mupirocin ointment 2 % Commonly known as: BACTROBAN Apply 1 Application topically 2 (two) times daily.   naproxen sodium 220 MG tablet Commonly known as: ALEVE Take 440 mg by mouth 2 (two) times daily as needed (pain).   nitroGLYCERIN 0.4 MG SL tablet Commonly known as: NITROSTAT PLACE 1 TABLET UNDER THE TONGUE EVERY  5 MINUTES AS NEEDED FOR CHEST PAIN FOR 3 DOSES   predniSONE 50 MG tablet Commonly known as: DELTASONE Take 1 tablet (50 mg total) by mouth daily with breakfast for 4 days.   ranolazine 500 MG 12 hr tablet Commonly known as: Ranexa Take 1 tablet (500 mg total) by mouth 2 (two) times daily.   rosuvastatin 20 MG tablet Commonly known as: CRESTOR Take 1 tablet (20 mg total) by mouth daily.   traMADol 50 MG tablet Commonly known as: ULTRAM Take 2 tablets (100 mg total) by mouth every 6 (six) hours as needed. What changed: reasons to take this   Vitamin D3 50 MCG (2000 UT) capsule Take 2,000 Units by mouth daily.   Vitrum Senior Tabs Take 1 tablet by mouth daily.        Follow-up Information     Hamrick, Maura L, MD Follow up in 1 week(s).   Specialty: Family Medicine Contact information: [address] [name] [address] [phone]                No Known Allergies  Consultations: None   Procedures/Studies: CT Angio Chest PE W and/or Wo Contrast Result Date: 08/24/2023 CLINICAL DATA:  Chest pain since last night.  Pain with deep breath. EXAM: CT ANGIOGRAPHY CHEST WITH CONTRAST TECHNIQUE: Multidetector CT imaging of the chest was performed using the standard protocol during bolus administration of intravenous contrast. Multiplanar CT image reconstructions and MIPs were obtained to evaluate the vascular anatomy. RADIATION DOSE REDUCTION: This exam was performed according to the departmental dose-optimization program which includes automated exposure control, adjustment of the mA and/or kV according to patient size and/or use of iterative reconstruction technique. CONTRAST:  75mL OMNIPAQUE IOHEXOL 350 MG/ML SOLN COMPARISON:  Same day chest radiograph and CT 05/28/2021 FINDINGS: Cardiovascular: Small pericardial effusion. Aortic and coronary artery atherosclerotic calcification. Negative for pulmonary embolism. Right chest wall Port-A-Cath. Mediastinum/Nodes:  Calcified mediastinal and hilar lymph nodes. Increased size of right diaphragmatic lymph nodes measuring up to 1.0 cm (series 5/image 234 and 5/246). Trachea and esophagus are unremarkable. Lungs/Pleura: Small right pleural effusion. Postoperative change of right basilar wedge resection diffuse bronchial wall thickening. Interlobular septal thickening in both lungs greatest in the right lower lobe. Upper Abdomen: Ill-defined hypoattenuating lesions in the posterior right hepatic lobe with surrounding small volume free fluid (series 4/image 168). This is new compared to 05/28/2021. There is a second  hypoattenuating lesion in the right hepatic lobe which may correspond ablation changes as seen on 05/28/2021 (circa series 4/image 154). Right lower lobe volume loss/atelectasis. Subpleural consolidation in the left lower lobe measuring 2.9 x 1.6 cm (6/125) favored to represent round atelectasis. No pneumothorax. Musculoskeletal: No acute fracture or destructive osseous lesion. Review of the MIP images confirms the above findings. IMPRESSION: 1. Negative for pulmonary embolism. 2. Diffuse bronchial wall thickening and interlobular septal thickening may be due to atypical infection or edema. 3. Small right pleural effusion with right lower lobe volume loss/atelectasis. 4. Enlarged right diaphragmatic lymph nodes concerning for metastasis. 5. Subpleural consolidation in the left lower lobe measuring 2.9 x 1.6 cm favored to represent round atelectasis. Follow-up CT in 3 months or PET/CT is recommended for further evaluation. 6. New vague hypoattenuating lesion in the posterior right hepatic lobe with surrounding small volume free fluid concerning for metastatic disease. Further evaluation by dedicated CT abdomen pelvis with IV contrast is recommended. 7. Small pericardial effusion. 8. Aortic Atherosclerosis (ICD10-I70.0). Electronically Signed   By: Minerva Fester M.D.   On: 08/24/2023 23:06   DG Chest Port 1 View Result  Date: 08/24/2023 CLINICAL DATA:  Chest pain beginning last night EXAM: PORTABLE CHEST 1 VIEW COMPARISON:  02/07/2023 FINDINGS: Power port on the right with the tip in the SVC above the right atrium. Chronic cardiomegaly. Chronic hilar and mediastinal nodal calcification. Chronic lung markings with previous surgery in the right lower lobe. Chronic pleural blunting on the right. IMPRESSION: No active disease. Chronic cardiomegaly. Chronic lung markings with previous surgery in the right lower lobe. Chronic pleural blunting on the right. Electronically Signed   By: Paulina Fusi M.D.   On: 08/24/2023 19:36     Discharge Exam: Vitals:   08/26/23 1303 08/26/23 1459  BP: (!) 113/58 (!) 112/58  Pulse: 73 72  Resp: 18 18  Temp: 97.9 F (36.6 C) 97.9 F (36.6 C)  SpO2: 96% 98%   Vitals:   08/26/23 0516 08/26/23 0846 08/26/23 1303 08/26/23 1459  BP: 113/82 130/60 (!) 113/58 (!) 112/58  Pulse:   73 72  Resp: 18 20 18 18   Temp: 97.6 F (36.4 C) (!) 97.5 F (36.4 C) 97.9 F (36.6 C) 97.9 F (36.6 C)  TempSrc: Oral Oral Oral Oral  SpO2:  100% 96% 98%  Weight: 103.4 kg     Height:        General: Pt is alert, awake, not in acute distress Cardiovascular: RRR, S1/S2 +, no rubs, no gallops Respiratory: CTA bilaterally, no wheezing, no rhonchi Abdominal: Soft, NT, ND, bowel sounds + Extremities: no edema, no cyanosis    The results of significant diagnostics from this hospitalization (including imaging, microbiology, ancillary and laboratory) are listed below for reference.     Microbiology: Recent Results (from the past 240 hours)  Resp panel by RT-PCR (RSV, Flu A&B, Covid) Anterior Nasal Swab     Status: None   Collection Time: 08/24/23  8:06 PM   Specimen: Anterior Nasal Swab  Result Value Ref Range Status   SARS Coronavirus 2 by RT PCR NEGATIVE NEGATIVE Final    Comment: (NOTE) SARS-CoV-2 target nucleic acids are NOT DETECTED.  The SARS-CoV-2 RNA is generally detectable in upper  respiratory specimens during the acute phase of infection. The lowest concentration of SARS-CoV-2 viral copies this assay can detect is 138 copies/mL. A negative result does not preclude SARS-Cov-2 infection and should not be used as the sole basis for treatment or other  patient management decisions. A negative result may occur with  improper specimen collection/handling, submission of specimen other than nasopharyngeal swab, presence of viral mutation(s) within the areas targeted by this assay, and inadequate number of viral copies(<138 copies/mL). A negative result must be combined with clinical observations, patient history, and epidemiological information. The expected result is Negative.  Fact Sheet for Patients:  BloggerCourse.com  Fact Sheet for Healthcare Providers:  SeriousBroker.it  This test is no t yet approved or cleared by the [place] FDA and  has been authorized for detection and/or diagnosis of SARS-CoV-2 by FDA under an Emergency Use Authorization (EUA). This EUA will remain  in effect (meaning this test can be used) for the duration of the COVID-19 declaration under Section 564(b)(1) of the Act, 21 U.S.C.section 360bbb-3(b)(1), unless the authorization is terminated  or revoked sooner.       Influenza A by PCR NEGATIVE NEGATIVE Final   Influenza B by PCR NEGATIVE NEGATIVE Final    Comment: (NOTE) The Xpert Xpress SARS-CoV-2/FLU/RSV plus assay is intended as an aid in the diagnosis of influenza from Nasopharyngeal swab specimens and should not be used as a sole basis for treatment. Nasal washings and aspirates are unacceptable for Xpert Xpress SARS-CoV-2/FLU/RSV testing.  Fact Sheet for Patients: BloggerCourse.com  Fact Sheet for Healthcare Providers: SeriousBroker.it  This test is not yet approved or cleared by the [place] FDA and has been  authorized for detection and/or diagnosis of SARS-CoV-2 by FDA under an Emergency Use Authorization (EUA). This EUA will remain in effect (meaning this test can be used) for the duration of the COVID-19 declaration under Section 564(b)(1) of the Act, 21 U.S.C. section 360bbb-3(b)(1), unless the authorization is terminated or revoked.     Resp Syncytial Virus by PCR NEGATIVE NEGATIVE Final    Comment: (NOTE) Fact Sheet for Patients: BloggerCourse.com  Fact Sheet for Healthcare Providers: SeriousBroker.it  This test is not yet approved or cleared by the [place] FDA and has been authorized for detection and/or diagnosis of SARS-CoV-2 by FDA under an Emergency Use Authorization (EUA). This EUA will remain in effect (meaning this test can be used) for the duration of the COVID-19 declaration under Section 564(b)(1) of the Act, 21 U.S.C. section 360bbb-3(b)(1), unless the authorization is terminated or revoked.  Performed at [facility], [address] [place], [name] [address]   Blood culture (routine x 2)     Status: None (Preliminary result)   Collection Time: 08/24/23 11:37 PM   Specimen: BLOOD  Result Value Ref Range Status   Specimen Description   Final    BLOOD RIGHT CHEST Performed at [facility], [address] [place], [name] [address]    Special Requests   Final    BOTTLES DRAWN AEROBIC AND ANAEROBIC Blood Culture adequate volume Performed at [facility], [address], [place], [name] [address]    Culture   Final    NO GROWTH < 12 HOURS Performed at [facility], [address] [place], [name] [address]    Report Status PENDING  Incomplete  Blood culture (routine x 2)     Status: None (Preliminary result)   Collection Time: 08/24/23 11:42 PM   Specimen: BLOOD  Result Value Ref Range Status   Specimen Description   Final    BLOOD  LEFT ANTECUBITAL Performed at [facility], [address] [place], [name] [address]    Special Requests   Final    BOTTLES DRAWN  AEROBIC AND ANAEROBIC Blood Culture adequate volume Performed at [facility], [address], [place], [name] [address]    Culture   Final    NO GROWTH < 12 HOURS Performed at [facility], [address], [place], [name] [address]    Report Status PENDING  Incomplete  Respiratory (~20 pathogens) panel by PCR     Status: None   Collection Time: 08/25/23 12:27 PM   Specimen: Nasopharyngeal Swab; Respiratory  Result Value Ref Range Status   Adenovirus NOT DETECTED NOT DETECTED Final   Coronavirus 229E NOT DETECTED NOT DETECTED Final    Comment: (NOTE) The Coronavirus on the Respiratory Panel, DOES NOT test for the novel  Coronavirus (2019 nCoV)    Coronavirus HKU1 NOT DETECTED NOT DETECTED Final   Coronavirus NL63 NOT DETECTED NOT DETECTED Final   Coronavirus OC43 NOT DETECTED NOT DETECTED Final   Metapneumovirus NOT DETECTED NOT DETECTED Final   Rhinovirus / Enterovirus NOT DETECTED NOT DETECTED Final   Influenza A NOT DETECTED NOT DETECTED Final   Influenza B NOT DETECTED NOT DETECTED Final   Parainfluenza Virus 1 NOT DETECTED NOT DETECTED Final   Parainfluenza Virus 2 NOT DETECTED NOT DETECTED Final   Parainfluenza Virus 3 NOT DETECTED NOT DETECTED Final   Parainfluenza Virus 4 NOT DETECTED NOT DETECTED Final   Respiratory Syncytial Virus NOT DETECTED NOT DETECTED Final   Bordetella pertussis NOT DETECTED NOT DETECTED Final   Bordetella Parapertussis NOT DETECTED NOT DETECTED Final   Chlamydophila pneumoniae NOT DETECTED NOT DETECTED Final   Mycoplasma pneumoniae NOT DETECTED NOT DETECTED Final    Comment: Performed at [facility], [address] [place], [name] [address]  Expectorated Sputum Assessment w Gram Stain, Rflx to Resp Cult     Status: None   Collection Time: 08/26/23  5:49 AM    Specimen: Sputum  Result Value Ref Range Status   Specimen Description SPUTUM  Final   Special Requests NONE  Final   Sputum evaluation   Final    THIS SPECIMEN IS ACCEPTABLE FOR SPUTUM CULTURE Performed at [facility], [address] [place], [name] [address]    Report Status 08/26/2023 FINAL  Final  Culture, Respiratory w Gram Stain     Status: None (Preliminary result)   Collection Time: 08/26/23  5:49 AM   Specimen: SPU  Result Value Ref Range Status   Specimen Description SPUTUM  Final   Special Requests NONE Reflexed from [id number]  Final   Gram Stain   Final    RARE SQUAMOUS EPITHELIAL CELLS PRESENT RARE WBC PRESENT, PREDOMINANTLY MONONUCLEAR FEW GRAM POSITIVE COCCI IN CLUSTERS RARE GRAM NEGATIVE RODS Performed at [facility], [address], [place], [name] [address]    Culture PENDING  Incomplete   Report Status PENDING  Incomplete     Labs: BNP (last 3 results) Recent Labs    08/24/23 1818  BNP 179.7*   Basic Metabolic Panel: Recent Labs  Lab 08/24/23 1818 08/25/23 1227 08/26/23 0500  NA 131* 129* 132*  K 4.6 4.1 4.5  CL 99 96* 99  CO2 23 22 23   GLUCOSE 116* 331* 243*  BUN 20 22 32*  CREATININE 0.98 1.44* 1.28*  CALCIUM 8.5* 8.7* 8.7*  MG  --  2.3 2.4   Liver Function Tests: Recent Labs  Lab 08/25/23 1227  AST 47*  ALT 32  ALKPHOS 235*  BILITOT 1.0  PROT 6.4*  ALBUMIN 3.0*   No  results for input(s): "LIPASE", "AMYLASE" in the last 168 hours. No results for input(s): "AMMONIA" in the last 168 hours. CBC: Recent Labs  Lab 08/24/23 1818 08/25/23 1227 08/26/23 0500  WBC 8.8 10.4 11.9*  NEUTROABS  --  9.7*  --   HGB 12.0* 12.0* 11.8*  HCT 36.2* 37.4* 35.3*  MCV 100.8* 101.6* 97.8  PLT 142* 147* 173   Cardiac Enzymes: No results for input(s): "CKTOTAL", "CKMB", "CKMBINDEX", "TROPONINI" in the last 168 hours. BNP: Invalid input(s): "POCBNP" CBG: No results for input(s): "GLUCAP" in the last 168 hours. D-Dimer Recent  Labs    08/24/23 1818  DDIMER 2.91*   Hgb A1c No results for input(s): "HGBA1C" in the last 72 hours. Lipid Profile No results for input(s): "CHOL", "HDL", "LDLCALC", "TRIG", "CHOLHDL", "LDLDIRECT" in the last 72 hours. Thyroid function studies Recent Labs    08/25/23 1227  TSH 1.278   Anemia work up No results for input(s): "VITAMINB12", "FOLATE", "FERRITIN", "TIBC", "IRON", "RETICCTPCT" in the last 72 hours. Urinalysis    Component Value Date/Time   COLORURINE AMBER (A) 07/28/2020 0549   APPEARANCEUR CLOUDY (A) 07/28/2020 0549   LABSPEC 1.016 07/28/2020 0549   PHURINE 7.0 07/28/2020 0549   GLUCOSEU NEGATIVE 07/28/2020 0549   HGBUR LARGE (A) 07/28/2020 0549   BILIRUBINUR NEGATIVE 07/28/2020 0549   KETONESUR NEGATIVE 07/28/2020 0549   PROTEINUR 100 (A) 07/28/2020 0549   NITRITE NEGATIVE 07/28/2020 0549   LEUKOCYTESUR NEGATIVE 07/28/2020 0549   Sepsis Labs Recent Labs  Lab 08/24/23 1818 08/25/23 1227 08/26/23 0500  WBC 8.8 10.4 11.9*   Microbiology Recent Results (from the past 240 hours)  Resp panel by RT-PCR (RSV, Flu A&B, Covid) Anterior Nasal Swab     Status: None   Collection Time: 08/24/23  8:06 PM   Specimen: Anterior Nasal Swab  Result Value Ref Range Status   SARS Coronavirus 2 by RT PCR NEGATIVE NEGATIVE Final    Comment: (NOTE) SARS-CoV-2 target nucleic acids are NOT DETECTED.  The SARS-CoV-2 RNA is generally detectable in upper respiratory specimens during the acute phase of infection. The lowest concentration of SARS-CoV-2 viral copies this assay can detect is 138 copies/mL. A negative result does not preclude SARS-Cov-2 infection and should not be used as the sole basis for treatment or other patient management decisions. A negative result may occur with  improper specimen collection/handling, submission of specimen other than nasopharyngeal swab, presence of viral mutation(s) within the areas targeted by this assay, and inadequate number of  viral copies(<138 copies/mL). A negative result must be combined with clinical observations, patient history, and epidemiological information. The expected result is Negative.  Fact Sheet for Patients:  BloggerCourse.com  Fact Sheet for Healthcare Providers:  SeriousBroker.it  This test is no t yet approved or cleared by the [place] FDA and  has been authorized for detection and/or diagnosis of SARS-CoV-2 by FDA under an Emergency Use Authorization (EUA). This EUA will remain  in effect (meaning this test can be used) for the duration of the COVID-19 declaration under Section 564(b)(1) of the Act, 21 U.S.C.section 360bbb-3(b)(1), unless the authorization is terminated  or revoked sooner.       Influenza A by PCR NEGATIVE NEGATIVE Final   Influenza B by PCR NEGATIVE NEGATIVE Final    Comment: (NOTE) The Xpert Xpress SARS-CoV-2/FLU/RSV plus assay is intended as an aid in the diagnosis of influenza from Nasopharyngeal swab specimens and should not be used as a sole basis for treatment. Nasal washings and aspirates are  unacceptable for Xpert Xpress SARS-CoV-2/FLU/RSV testing.  Fact Sheet for Patients: BloggerCourse.com  Fact Sheet for Healthcare Providers: SeriousBroker.it  This test is not yet approved or cleared by the [place] FDA and has been authorized for detection and/or diagnosis of SARS-CoV-2 by FDA under an Emergency Use Authorization (EUA). This EUA will remain in effect (meaning this test can be used) for the duration of the COVID-19 declaration under Section 564(b)(1) of the Act, 21 U.S.C. section 360bbb-3(b)(1), unless the authorization is terminated or revoked.     Resp Syncytial Virus by PCR NEGATIVE NEGATIVE Final    Comment: (NOTE) Fact Sheet for Patients: BloggerCourse.com  Fact Sheet for Healthcare  Providers: SeriousBroker.it  This test is not yet approved or cleared by the [place] FDA and has been authorized for detection and/or diagnosis of SARS-CoV-2 by FDA under an Emergency Use Authorization (EUA). This EUA will remain in effect (meaning this test can be used) for the duration of the COVID-19 declaration under Section 564(b)(1) of the Act, 21 U.S.C. section 360bbb-3(b)(1), unless the authorization is terminated or revoked.  Performed at [facility], [address] [place], [name] [address]   Blood culture (routine x 2)     Status: None (Preliminary result)   Collection Time: 08/24/23 11:37 PM   Specimen: BLOOD  Result Value Ref Range Status   Specimen Description   Final    BLOOD RIGHT CHEST Performed at [facility], [address] [place], [name] [address]    Special Requests   Final    BOTTLES DRAWN AEROBIC AND ANAEROBIC Blood Culture adequate volume Performed at [facility], [address] [place], [name] [address]    Culture   Final    NO GROWTH < 12 HOURS Performed at [facility], [address] [place], [name] [address]    Report Status PENDING  Incomplete  Blood culture (routine x 2)     Status: None (Preliminary result)   Collection Time: 08/24/23 11:42 PM   Specimen: BLOOD  Result Value Ref Range Status   Specimen Description   Final    BLOOD LEFT ANTECUBITAL Performed at [facility], [address] [place], [name] [address]    Special Requests   Final    BOTTLES DRAWN AEROBIC AND ANAEROBIC Blood Culture adequate volume Performed at [facility], [address], [place], [name] [address]    Culture   Final    NO GROWTH < 12 HOURS Performed at [facility], [address], [place], [name] [address]    Report Status PENDING  Incomplete  Respiratory (~20 pathogens) panel by PCR     Status: None    Collection Time: 08/25/23 12:27 PM   Specimen: Nasopharyngeal Swab; Respiratory  Result Value Ref Range Status   Adenovirus NOT DETECTED NOT DETECTED Final   Coronavirus 229E NOT DETECTED NOT DETECTED Final    Comment: (NOTE) The Coronavirus on the Respiratory Panel, DOES NOT test for the novel  Coronavirus (2019 nCoV)    Coronavirus HKU1 NOT DETECTED NOT DETECTED Final   Coronavirus NL63 NOT DETECTED NOT DETECTED Final   Coronavirus OC43 NOT DETECTED NOT DETECTED Final   Metapneumovirus NOT DETECTED NOT DETECTED Final   Rhinovirus / Enterovirus NOT DETECTED NOT DETECTED Final   Influenza A NOT DETECTED NOT DETECTED Final   Influenza B NOT DETECTED NOT DETECTED Final   Parainfluenza Virus 1 NOT DETECTED NOT DETECTED Final   Parainfluenza Virus 2 NOT DETECTED NOT  DETECTED Final   Parainfluenza Virus 3 NOT DETECTED NOT DETECTED Final   Parainfluenza Virus 4 NOT DETECTED NOT DETECTED Final   Respiratory Syncytial Virus NOT DETECTED NOT DETECTED Final   Bordetella pertussis NOT DETECTED NOT DETECTED Final   Bordetella Parapertussis NOT DETECTED NOT DETECTED Final   Chlamydophila pneumoniae NOT DETECTED NOT DETECTED Final   Mycoplasma pneumoniae NOT DETECTED NOT DETECTED Final    Comment: Performed at [facility], [address], [place], [name] [address]  Expectorated Sputum Assessment w Gram Stain, Rflx to Resp Cult     Status: None   Collection Time: 08/26/23  5:49 AM   Specimen: Sputum  Result Value Ref Range Status   Specimen Description SPUTUM  Final   Special Requests NONE  Final   Sputum evaluation   Final    THIS SPECIMEN IS ACCEPTABLE FOR SPUTUM CULTURE Performed at [facility], [address], [place], [name] [address]    Report Status 08/26/2023 FINAL  Final  Culture, Respiratory w Gram Stain     Status: None (Preliminary result)   Collection Time: 08/26/23  5:49 AM   Specimen: SPU  Result Value Ref Range Status   Specimen Description SPUTUM  Final    Special Requests NONE Reflexed from [id number]  Final   Gram Stain   Final    RARE SQUAMOUS EPITHELIAL CELLS PRESENT RARE WBC PRESENT, PREDOMINANTLY MONONUCLEAR FEW GRAM POSITIVE COCCI IN CLUSTERS RARE GRAM NEGATIVE RODS Performed at [facility], [address] [place], [name] [address]    Culture PENDING  Incomplete   Report Status PENDING  Incomplete    FURTHER DISCHARGE INSTRUCTIONS:   Get Medicines reviewed and adjusted: Please take all your medications with you for your next visit with your Primary MD   Laboratory/radiological data: Please request your Primary MD to go over all hospital tests and procedure/radiological results at the follow up, please ask your Primary MD to get all Hospital records sent to his/her office.   In some cases, they will be blood work, cultures and biopsy results pending at the time of your discharge. Please request that your primary care M.D. goes through all the records of your hospital data and follows up on these results.   Also Note the following: If you experience worsening of your admission symptoms, develop shortness of breath, life threatening emergency, suicidal or homicidal thoughts you must seek medical attention immediately by calling 911 or calling your MD immediately  if symptoms less severe.   You must read complete instructions/literature along with all the possible adverse reactions/side effects for all the Medicines you take and that have been prescribed to you. Take any new Medicines after you have completely understood and accpet all the possible adverse reactions/side effects.    Do not drive when taking Pain medications or sleeping medications (Benzodaizepines)   Do not take more than prescribed Pain, Sleep and Anxiety Medications. It is not advisable to combine anxiety,sleep and pain medications without talking with your primary care practitioner   Special Instructions: If you have smoked or chewed Tobacco  in the last 2  yrs please stop smoking, stop any regular Alcohol  and or any Recreational drug use.   Wear Seat belts while driving.   Please note: You were cared for by a hospitalist during your hospital stay. Once you are discharged, your primary care physician will handle any further medical issues. Please note that NO REFILLS for any discharge medications will be authorized  once you are discharged, as it is imperative that you return to your primary care physician (or establish a relationship with a primary care physician if you do not have one) for your post hospital discharge needs so that they can reassess your need for medications and monitor your lab values  Time coordinating discharge: Over 30 minutes  SIGNED:   Hughie Closs, MD  [facility] 08/26/2023, 3:52 PM *Please note that this is a verbal dictation therefore any spelling or grammatical errors are due to the "Dragon Medical One" system interpretation. If 7PM-7AM, please contact night-coverage [email or web address]

## 2023-08-27 ENCOUNTER — Ambulatory Visit: Payer: Medicare PPO | Admitting: Oncology

## 2023-08-27 NOTE — Telephone Encounter (Signed)
 Dr Judeth Horn, this pt sent msg via mychart to let you know that he had recent hospital admission Current f/u in May 2025  here

## 2023-08-28 LAB — CULTURE, RESPIRATORY W GRAM STAIN

## 2023-08-29 LAB — LEGIONELLA PNEUMOPHILA SEROGP 1 UR AG: L. pneumophila Serogp 1 Ur Ag: NEGATIVE

## 2023-08-30 DIAGNOSIS — J449 Chronic obstructive pulmonary disease, unspecified: Secondary | ICD-10-CM | POA: Diagnosis not present

## 2023-08-30 LAB — CULTURE, BLOOD (ROUTINE X 2)
Culture: NO GROWTH
Culture: NO GROWTH
Special Requests: ADEQUATE
Special Requests: ADEQUATE

## 2023-08-31 ENCOUNTER — Inpatient Hospital Stay: Payer: Medicare PPO | Attending: Oncology | Admitting: Oncology

## 2023-09-04 DIAGNOSIS — I2510 Atherosclerotic heart disease of native coronary artery without angina pectoris: Secondary | ICD-10-CM | POA: Diagnosis not present

## 2023-09-04 DIAGNOSIS — R7303 Prediabetes: Secondary | ICD-10-CM | POA: Diagnosis not present

## 2023-09-04 DIAGNOSIS — Z79899 Other long term (current) drug therapy: Secondary | ICD-10-CM | POA: Diagnosis not present

## 2023-09-04 DIAGNOSIS — I771 Stricture of artery: Secondary | ICD-10-CM | POA: Diagnosis not present

## 2023-09-04 DIAGNOSIS — E785 Hyperlipidemia, unspecified: Secondary | ICD-10-CM | POA: Diagnosis not present

## 2023-09-04 DIAGNOSIS — J441 Chronic obstructive pulmonary disease with (acute) exacerbation: Secondary | ICD-10-CM | POA: Diagnosis not present

## 2023-09-04 DIAGNOSIS — I10 Essential (primary) hypertension: Secondary | ICD-10-CM | POA: Diagnosis not present

## 2023-09-04 DIAGNOSIS — J8410 Pulmonary fibrosis, unspecified: Secondary | ICD-10-CM | POA: Diagnosis not present

## 2023-09-04 DIAGNOSIS — I5032 Chronic diastolic (congestive) heart failure: Secondary | ICD-10-CM | POA: Diagnosis not present

## 2023-09-05 DIAGNOSIS — R918 Other nonspecific abnormal finding of lung field: Secondary | ICD-10-CM | POA: Diagnosis not present

## 2023-09-05 DIAGNOSIS — C180 Malignant neoplasm of cecum: Secondary | ICD-10-CM | POA: Diagnosis not present

## 2023-09-05 DIAGNOSIS — C189 Malignant neoplasm of colon, unspecified: Secondary | ICD-10-CM | POA: Diagnosis not present

## 2023-09-05 DIAGNOSIS — C787 Secondary malignant neoplasm of liver and intrahepatic bile duct: Secondary | ICD-10-CM | POA: Diagnosis not present

## 2023-09-06 DIAGNOSIS — E039 Hypothyroidism, unspecified: Secondary | ICD-10-CM | POA: Diagnosis not present

## 2023-09-06 DIAGNOSIS — K219 Gastro-esophageal reflux disease without esophagitis: Secondary | ICD-10-CM | POA: Diagnosis not present

## 2023-09-06 DIAGNOSIS — C787 Secondary malignant neoplasm of liver and intrahepatic bile duct: Secondary | ICD-10-CM | POA: Diagnosis not present

## 2023-09-06 DIAGNOSIS — Z79890 Hormone replacement therapy (postmenopausal): Secondary | ICD-10-CM | POA: Diagnosis not present

## 2023-09-06 DIAGNOSIS — C189 Malignant neoplasm of colon, unspecified: Secondary | ICD-10-CM | POA: Diagnosis not present

## 2023-09-06 DIAGNOSIS — R197 Diarrhea, unspecified: Secondary | ICD-10-CM | POA: Diagnosis not present

## 2023-09-06 DIAGNOSIS — J90 Pleural effusion, not elsewhere classified: Secondary | ICD-10-CM | POA: Diagnosis not present

## 2023-09-06 DIAGNOSIS — C180 Malignant neoplasm of cecum: Secondary | ICD-10-CM | POA: Diagnosis not present

## 2023-09-06 DIAGNOSIS — Z79899 Other long term (current) drug therapy: Secondary | ICD-10-CM | POA: Diagnosis not present

## 2023-09-13 ENCOUNTER — Ambulatory Visit: Admitting: Internal Medicine

## 2023-09-13 ENCOUNTER — Encounter: Payer: Self-pay | Admitting: Internal Medicine

## 2023-09-13 ENCOUNTER — Telehealth: Payer: Self-pay | Admitting: Oncology

## 2023-09-13 VITALS — BP 130/68 | HR 47 | Ht 71.0 in | Wt 234.0 lb

## 2023-09-13 DIAGNOSIS — J4550 Severe persistent asthma, uncomplicated: Secondary | ICD-10-CM | POA: Diagnosis not present

## 2023-09-13 DIAGNOSIS — J90 Pleural effusion, not elsewhere classified: Secondary | ICD-10-CM

## 2023-09-13 DIAGNOSIS — D860 Sarcoidosis of lung: Secondary | ICD-10-CM | POA: Diagnosis not present

## 2023-09-13 DIAGNOSIS — Z7952 Long term (current) use of systemic steroids: Secondary | ICD-10-CM | POA: Diagnosis not present

## 2023-09-13 DIAGNOSIS — J441 Chronic obstructive pulmonary disease with (acute) exacerbation: Secondary | ICD-10-CM

## 2023-09-13 DIAGNOSIS — J309 Allergic rhinitis, unspecified: Secondary | ICD-10-CM | POA: Diagnosis not present

## 2023-09-13 MED ORDER — CETIRIZINE HCL 10 MG PO TABS: 10.0000 mg | ORAL_TABLET | Freq: Every day | ORAL | 11 refills | Status: DC

## 2023-09-13 MED ORDER — MONTELUKAST SODIUM 10 MG PO TABS: 10.0000 mg | ORAL_TABLET | Freq: Every day | ORAL | 11 refills | Status: DC

## 2023-09-13 NOTE — Patient Instructions (Addendum)
 It was a pleasure to see you today!  Please keep your appointment with Dr. Judeth Horn.  Please call [phone] if you haven't heard from [place] and always call us sooner if issues or concerns arise. You can also send [place] a message through MyChart, but but aware that this is not to be used for urgent issues and it may take up to 5-7 days to receive a reply. Please be aware that you will likely be able to view your results before I have a chance to respond to them. Please give [place] 5 business days to respond to any non-urgent results.    Sorry to hear you have been feeling well.  I think your current nagging cough is likely secondary to chronic drainage.  Please resume your Flonase 1 spray in each nare twice a day.  I think we can hold off on further steroids today given you just had 2 courses.  Start montelukast 1 pill once a day to help with allergies as well as cetirizine 1 pill once a day.  You can take 1 in the morning and 1 at night.  This will help with the breathing and the drainage.  You have needed prednisone many times over the last 12 months.  I believe that you are starting to have side effects from the prednisone.  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  Dupixent would be the one I would consider.  I will check with the pharmacy team to make sure there is no concerns about starting this while you are getting your upcoming chemotherapy.  Continue the Breztri 2 puffs twice a day, with albuterol inhaler or nebulizer treatment up to 6 times a day as needed.

## 2023-09-13 NOTE — Progress Notes (Signed)
 Luis [name]    [id number]    [birth year]  Primary Care Physician:Hamrick, Durward Fortes, MD Date of Appointment: 09/13/2023 Established Patient Visit  Chief complaint:   Chief Complaint  Patient presents with   Follow-up    History of pneumonia march 17, pt states breathing is getting worse.     HPI: Luis [name] is a 83 y.o. is a 83 y.o. with history of tobacco use disorder quit 2016, recurrent right pleural effusion, asthma, pulmonary sarcoidosis. 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 Records reviewed from [facility] oncology - plan is for SBRT for progression on most recent CT Chest.   Interval Updates: Seen primarily with Dr. Judeth Horn. Here for an acute visit. Has high symptom burden and frequent exacerbations requiring predisone and antibiotics.    Recent pneumonia hospitalization treated with augmentin and steroids. Has completed the course but still not feeling well. Felt well while on steroids. The shortness of breath returned as soon as the steroids finished. His cough is nagging, not productive. Was given another course of the steroids from PCP Dr Nathanial Rancher (taper) with improvement.   On April 14th will be starting minocycline for his colon cancer.    Total number of prednisone course in the last year -   Absolute eosinophil count - 800 in Dec 2021.   Current therapy - breztri 2 puffs twice daily, albuterol nebulizer.   He does have uncontrolled rhinitis - not on any   Pulmonary sarcoidosis - diagnosed by bronchoscopy in the 1990s. Treated with prednisone. Has been clinically quiescent.  CT Chest in July 2024 at [facility] 1.  Unchanged fibrotic lung disease, consistent with known sarcoidosis. No  suspicious pulmonary nodule.  2.  Slightly decreased small right pleural effusion.  3.  Mid to distal esophageal wall thickening, which can be seen in  esophagitis.  4. Liver is not well evaluated on noncontrast images, better evaluated  on  10/09/2022 MRI abdomen.    I have reviewed the patient's family social and past medical history and updated as appropriate.   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 08/2014 Cath/PCI: LM nl, LAD 30p, D1 95 (2.25x12 Resolute Integrity DES), LCX small, RI 100 (attempted PCI) - branches fill via L->L collats, RCA dominant, nl, RPDA/PLA nl, EF 60^. b. 02/24/2015 PCI CTO of Ramus DES x2.   Chronic bronchitis (HCC)    hx   Diverticulosis 05/2005   Elevated lipase    Fuchs' corneal dystrophy    History of adenomatous polyp of colon 05/2005   8 mm adenoma   History of blood transfusion    "related to some of my surgeries"   Hyperlipidemia    Hypertension    Iron deficiency anemia due to chronic blood loss 06/21/2017   Pneumonia    Prostate cancer (HCC) 2011   S/P seed implant   Sarcoidosis    Thrombocytopenia (HCC)    a. Noted on prior labs, unclear of what w/u done.   Trifascicular block     Past Surgical History:  Procedure Laterality Date   BASAL CELL CARCINOMA EXCISION Left    shoulder   CARDIAC CATHETERIZATION N/A 02/24/2015  Procedure: Coronary/Bypass Graft CTO Intervention;  Surgeon: Corky Crafts, MD;  Location: [facility] INVASIVE CV LAB;  Service: Cardiovascular;  Laterality: N/A;   CARDIAC CATHETERIZATION  02/24/2015   Procedure: Coronary/Graft Atherectomy;  Surgeon: Corky Crafts, MD;  Location: [facility] INVASIVE CV LAB;  Service: Cardiovascular;;   CATARACT EXTRACTION W/ INTRAOCULAR LENS  IMPLANT, BILATERAL Bilateral ~ 2011-2012   COLONOSCOPY W/ POLYPECTOMY  06/08/2005 and 10/18/2010   8 mm adenoma 2006, none 2012. Diverticulosis and internal hemorrhoids.   CORNEAL TRANSPLANT Bilateral ~ 2011-2012   "@  same time as cataract OR"   CORONARY ANGIOPLASTY WITH STENT PLACEMENT  08/2014; 02/24/2015   "1 stent + 1 stent"   CORONARY STENT INTERVENTION N/A 01/02/2019   Procedure: CORONARY STENT INTERVENTION;  Surgeon: Corky Crafts, MD;  Location: [facility] INVASIVE CV LAB;  Service: Cardiovascular;  Laterality: N/A;   EYE SURGERY     FINGER SURGERY Right 2014   "reattached middle finger"   INSERTION PROSTATE RADIATION SEED  ~ 2011   IR IMAGING GUIDED PORT INSERTION  03/04/2019   IR RADIOLOGIST EVAL & MGMT  05/26/2020   IR US GUIDE BX ASP/DRAIN  03/04/2019   LAPAROSCOPIC CHOLECYSTECTOMY  2015   LAPAROSCOPIC PARTIAL COLECTOMY N/A 07/26/2017   Procedure: LAPAROSCOPIC PARTIAL COLECTOMY, EXCISION SCROTAL CYST;  Surgeon: Gaynelle Adu, MD;  Location: [facility] ORS;  Service: General;  Laterality: N/A;   LEFT HEART CATH AND CORONARY ANGIOGRAPHY N/A 01/02/2019   Procedure: LEFT HEART CATH AND CORONARY ANGIOGRAPHY;  Surgeon: Corky Crafts, MD;  Location: [facility] INVASIVE CV LAB;  Service: Cardiovascular;  Laterality: N/A;   LEFT HEART CATHETERIZATION WITH CORONARY ANGIOGRAM N/A 08/12/2014   Procedure: LEFT HEART CATHETERIZATION WITH CORONARY ANGIOGRAM;  Surgeon: Micheline Chapman, MD;  Location: [facility] CATH LAB;  Service: Cardiovascular;  Laterality: N/A;   LYMPH NODE BIOPSY     mid chest   MOHS SURGERY Right ~ 2011   "eyelid; for basal cell"   RADIOLOGY WITH ANESTHESIA N/A 06/30/2020   Procedure: IR WITH ANESTHESIA MICROWAVE ABLATION;  Surgeon: Gilmer Mor, DO;  Location: [facility] ORS;  Service: Anesthesiology;  Laterality: N/A;   RIGHT/LEFT HEART CATH AND CORONARY ANGIOGRAPHY N/A 04/25/2022   Procedure: RIGHT/LEFT HEART CATH AND CORONARY ANGIOGRAPHY;  Surgeon: Corky Crafts, MD;  Location: [facility] INVASIVE CV LAB;  Service: Cardiovascular;  Laterality: N/A;   THORACENTESIS N/A 02/11/2021   Procedure: Alanson Puls;  Surgeon: Karren Burly, MD;  Location: [facility] ENDOSCOPY;  Service: Pulmonary;  Laterality: N/A;    THORACENTESIS Right 04/04/2021   Procedure: THORACENTESIS;  Surgeon: Lorin Glass, MD;  Location: [facility] ENDOSCOPY;  Service: Pulmonary;  Laterality: Right;   THORACENTESIS Right 08/16/2021   Procedure: THORACENTESIS;  Surgeon: Cheri Fowler, MD;  Location: [facility] ENDOSCOPY;  Service: Pulmonary;  Laterality: Right;   THORACENTESIS Right 11/29/2021   Procedure: THORACENTESIS;  Surgeon: Martina Sinner, MD;  Location: [facility] ENDOSCOPY;  Service: Pulmonary;  Laterality: Right;   THORACENTESIS N/A 03/17/2022   Procedure: Alanson Puls;  Surgeon: Karren Burly, MD;  Location: [facility] ENDOSCOPY;  Service: Pulmonary;  Laterality: N/A;   THORACENTESIS N/A 07/14/2022   Procedure: Alanson Puls;  Surgeon: Josephine Igo, DO;  Location: [facility] ENDOSCOPY;  Service: Pulmonary;  Laterality: N/A;   THORACENTESIS N/A 10/11/2022   Procedure: Alanson Puls;  Surgeon: Cheri Fowler, MD;  Location: [facility] ENDOSCOPY;  Service: Pulmonary;  Laterality: N/A;   THOROCOTOMY WITH LOBECTOMY Right ~ 1991   partial removal right lung   TONSILLECTOMY  ~ 1950    Family History  Problem Relation Age of Onset   Heart disease Father 69       Died from "hardening of the arteries" age 80   Coronary artery disease Sister 30   Heart attack Sister    Colon cancer Neg Hx    Throat cancer Neg Hx    Pancreatic cancer Neg Hx    Prostate cancer Neg Hx    Stroke Neg Hx    Hypertension Neg Hx     Social History   Occupational History   Occupation: Unemployed  Tobacco Use   Smoking status: Light Smoker    Current packs/day: 0.00    Types: Cigars, Cigarettes    Last attempt to quit: 02/24/1956    Years since quitting: 67.5   Smokeless tobacco: Never   Tobacco comments:    02/24/2015 "quit cigarettes ~ 2000 ago but smokes cigars about once every two months. Updated 01/16/2023 am  Vaping Use   Vaping status: Never Used  Substance and Sexual Activity   Alcohol use: Yes    Alcohol/week: 2.0 standard drinks of alcohol    Types: 1 Glasses of  wine, 1 Standard drinks or equivalent per week    Comment: Drinks maybe 1-2 drinks a week or less   Drug use: No   Sexual activity: Not on file     Physical Exam: Blood pressure 130/68, pulse (!) 47, height 5\' 11"  (1.803 m), weight 234 lb (106.1 kg), SpO2 95%.  Gen:      Elderly, chronically ill-appearing, no respiratory distress ENT: No thrush, nares patent, mild cobblestoning in oropharynx Lungs:   Diminished, no wheeze CV:         Regular rate and rhythm, no peripheral edema   Data Reviewed: Imaging: I have personally reviewed the CT chest performed March 2025 which shows chronic right lower lobe atelectasis and some architectural distortion.  There is a left lower lobe nodular consolidation likely rounded atelectasis.  Otherwise diffuse bronchial wall thickening.  PFTs:     Latest Ref Rng & Units 03/10/2020    3:36 PM 08/30/2017    3:59 PM  PFT Results  FVC-Pre L 2.94  P 3.14   FVC-Predicted Pre % 68  P 72   FVC-Post L 2.90  P 3.18   FVC-Predicted Post % 67  P 73   Pre FEV1/FVC % % 66  P 70   Post FEV1/FCV % % 68  P 73   FEV1-Pre L 1.93  P 2.19   FEV1-Predicted Pre % 62  P 69   FEV1-Post L 1.96  P 2.31   DLCO uncorrected ml/min/mmHg 19.10  P 17.73   DLCO UNC% % 75  P 52   DLCO corrected ml/min/mmHg 21.40  P 20.68   DLCO COR %Predicted % 84  P 61   DLVA Predicted % 118  P 98   TLC L 5.02  P 4.99   TLC % Predicted % 69  P 68   RV % Predicted % 76  P 71     P Preliminary result   I have personally reviewed the patient's PFTs and moderate airflow limitation FEV1 63% of predicted with mixed restriction to ventilation.   Labs: Elevated absolute eosinophil count.  Lab Results  Component Value Date   WBC 11.9 (H) 08/26/2023   HGB 11.8 (L) 08/26/2023   HCT 35.3 (L) 08/26/2023   MCV 97.8 08/26/2023   PLT 173 08/26/2023   Lab Results  Component Value Date   NA 132 (L) 08/26/2023  K 4.5 08/26/2023   CL 99 08/26/2023   CO2 23 08/26/2023     Immunization  status: Immunization History  Administered Date(s) Administered   DTaP / IPV 09/25/2012   Fluad Quad(high Dose 65+) 02/19/2019, 03/14/2022   Influenza Split 03/27/2014   Influenza, High Dose Seasonal PF 03/27/2014, 01/27/2017, 02/22/2018, 03/12/2021   Influenza-Unspecified 02/24/2017, 03/10/2020   PFIZER Comirnaty(Gray Top)Covid-19 Tri-Sucrose Vaccine 07/21/2019   PFIZER(Purple Top)SARS-COV-2 Vaccination 07/11/2019, 07/21/2019, 01/26/2020, 10/26/2020   Pneumococcal Conjugate-13 03/27/2014   Pneumococcal Polysaccharide-23 11/10/2009   Pneumococcal-Unspecified 03/12/2014   Tdap 03/24/2011, 09/25/2012, 05/28/2021   Zoster Recombinant(Shingrix) 09/01/2018, 02/19/2019   Zoster, Live 03/21/2010    External Records Personally Reviewed: [facility], pulmonary  Assessment:  Severe persistent asthma COPD overlap syndrome, not well controlled with frequent Oral steroid use Recurrent right pleural effusion Pulmonary Sarcoidosis Peripheral eosinophilia Chronic rhinitis not well controlled Adenocarcinoma of the colon with hepatic metastases - currently on SBRT  Plan/Recommendations:   Sorry to hear you have been feeling well.  I think your current nagging cough is likely secondary to chronic drainage.  Please resume your Flonase 1 spray in each nare twice a day.  I think we can hold off on further steroids today given you just had 2 courses.  Start montelukast 1 pill once a day to help with allergies as well as cetirizine 1 pill once a day.  You can take 1 in the morning and 1 at night.  This will help with the breathing and the drainage.  You have needed prednisone many times over the last 12 months.  I believe that you are starting to have side effects from the prednisone.  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  Dupixent would be the one I would consider.  I will check with the pharmacy team to make sure there is no concerns about  starting this while you are getting your upcoming chemotherapy.  Continue the Breztri 2 puffs twice a day, with albuterol inhaler or nebulizer treatment up to 6 times a day as needed.   Return to Care: With Dr. Judeth Horn next month   Durel Salts, MD Pulmonary and Critical Care Medicine [facility] [phone]

## 2023-09-13 NOTE — Telephone Encounter (Signed)
 Called to see if pt wished to r/s their missed appt with Dr Truett Perna. LVM to return call for scheduling.

## 2023-09-13 NOTE — Progress Notes (Unsigned)
 Patient filled out an enrollment form for Dupixent. This form was signed by Dr Celine Mans and placed up front in the pharmacy folder 09/13/2023.

## 2023-09-14 DIAGNOSIS — C189 Malignant neoplasm of colon, unspecified: Secondary | ICD-10-CM | POA: Diagnosis not present

## 2023-09-14 DIAGNOSIS — C787 Secondary malignant neoplasm of liver and intrahepatic bile duct: Secondary | ICD-10-CM | POA: Diagnosis not present

## 2023-09-18 ENCOUNTER — Other Ambulatory Visit: Payer: Self-pay | Admitting: Internal Medicine

## 2023-09-19 ENCOUNTER — Telehealth: Payer: Self-pay | Admitting: Pharmacist

## 2023-09-19 NOTE — Telephone Encounter (Addendum)
 I discussed with both Dr. Judeth Horn and Dr. Celine Mans this afternoon. Dupixent is not immunosuppressive and we can get patient started on Dupixent. Should not affect efficacy of any chemotherapy. I talked to patient regarding this and he is in agreement. He will notify his oncologist (he is changing chemotherapy regimen)  Will start Dupixent benefits investigation for asthma-COPD overlap  Chesley Mires, PharmD, MPH, BCPS, CPP Clinical Pharmacist (Rheumatology and Pulmonology)   ----- Message from Charlott Holler sent at 09/13/2023  9:56 AM EDT ----- He needs dupixent. Matilynn Dacey any concerns with dupixent while starting chemotherapy for colon cancer.  Had him fill out paperwork today.

## 2023-09-20 ENCOUNTER — Encounter: Payer: Self-pay | Admitting: Internal Medicine

## 2023-09-20 DIAGNOSIS — E785 Hyperlipidemia, unspecified: Secondary | ICD-10-CM | POA: Diagnosis not present

## 2023-09-20 DIAGNOSIS — Z79899 Other long term (current) drug therapy: Secondary | ICD-10-CM | POA: Diagnosis not present

## 2023-09-21 ENCOUNTER — Telehealth: Payer: Self-pay | Admitting: Oncology

## 2023-09-21 ENCOUNTER — Inpatient Hospital Stay: Attending: Oncology | Admitting: Oncology

## 2023-09-21 VITALS — BP 167/66 | HR 64 | Temp 97.8°F | Resp 18 | Ht 71.0 in | Wt 233.0 lb

## 2023-09-21 DIAGNOSIS — C787 Secondary malignant neoplasm of liver and intrahepatic bile duct: Secondary | ICD-10-CM | POA: Diagnosis not present

## 2023-09-21 DIAGNOSIS — C180 Malignant neoplasm of cecum: Secondary | ICD-10-CM | POA: Insufficient documentation

## 2023-09-21 DIAGNOSIS — R970 Elevated carcinoembryonic antigen [CEA]: Secondary | ICD-10-CM | POA: Insufficient documentation

## 2023-09-21 DIAGNOSIS — C7800 Secondary malignant neoplasm of unspecified lung: Secondary | ICD-10-CM | POA: Insufficient documentation

## 2023-09-21 NOTE — Telephone Encounter (Signed)
 Patient has been scheduled for follow-up visit per 09/21/23 LOS.  LVM notifying pt of appt details, provided my direct number to pt if appt changes need to be made.     Follow-up disposition: Return in about 6 months (around 03/22/2024) for office.  Check out comments: Office 6 months

## 2023-09-21 NOTE — Progress Notes (Signed)
 [facility] OFFICE PROGRESS NOTE   Diagnosis: Colon cancer  INTERVAL HISTORY:   Mr. Ruggieri returns as scheduled.  He continues close follow-up with Dr. Maryruth Hancock for management of metastatic colon cancer.  Since I saw him in October of last year he has completed treatment with FOLFIRI/panitumumab and SBRT to a right lung lesion.  Restaging CTs 09/05/2023 revealed evidence of disease progression in the liver and possible lymphatic tumor spread in the right lung.  He is scheduled to resume treatment with FOLFOX/panitumumab on 09/24/2023.  He has chronic exertional dyspnea.  He has chronic back pain, improved at present.  Good appetite.  No other complaint.  Objective:  Vital signs in last 24 hours:  Blood pressure (!) 167/66, pulse 64, temperature 97.8 F (36.6 C), temperature source Temporal, resp. rate 18, height 5\' 11"  (1.803 m), weight 233 lb (105.7 kg), SpO2 98%.     Resp: Lungs clear bilaterally Cardio: Regular rate and rhythm GI: Left abdomen infusion pump, no hepatosplenomegaly Vascular: No leg edema   Portacath/PICC-without erythema  Lab Results:  Lab Results  Component Value Date   WBC 11.9 (H) 08/26/2023   HGB 11.8 (L) 08/26/2023   HCT 35.3 (L) 08/26/2023   MCV 97.8 08/26/2023   PLT 173 08/26/2023   NEUTROABS 9.7 (H) 08/25/2023    CMP  Lab Results  Component Value Date   NA 132 (L) 08/26/2023   K 4.5 08/26/2023   CL 99 08/26/2023   CO2 23 08/26/2023   GLUCOSE 243 (H) 08/26/2023   BUN 32 (H) 08/26/2023   CREATININE 1.28 (H) 08/26/2023   CALCIUM 8.7 (L) 08/26/2023   PROT 6.4 (L) 08/25/2023   ALBUMIN 3.0 (L) 08/25/2023   AST 47 (H) 08/25/2023   ALT 32 08/25/2023   ALKPHOS 235 (H) 08/25/2023   BILITOT 1.0 08/25/2023   GFRNONAA 56 (L) 08/26/2023   GFRAA >60 03/10/2020    Lab Results  Component Value Date   CEA1 2.10 04/14/2020   CEA 1.3 06/20/2017    Medications: I have reviewed the patient's current  medications.   Assessment/Plan: Adenocarcinoma of the cecum, stage IIA (T3N0), status post a right colectomy 07/26/2017 0/16 lymph nodes positive, no lymphovascular invasion, perineural invasion present MSI-stable, no loss of mismatch repair protein expression Surveillance colonoscopy 07/26/2018-no polyps found Elevated CEA June 2020, persistently elevated August 2020 CTs 02/07/2019- bilateral liver metastases, no extrahepatic metastatic disease, changes of sarcoidosis in the chest Biopsy liver lesion 03/04/2019-metastatic adenocarcinoma, with morphology consistent with metastatic colonic adenocarcinoma, RAS WT Cycle 1 FOLFOX 03/12/2019 Cycle 2 FOLFOX 03/26/2019, oxaliplatin dose reduced secondary to thrombocytopenia Cycle 3 FOLFOX 04/09/2019, oxaliplatin further dose reduced and aspirin held secondary to thrombocytopenia Cycle 4 FOLFOX 04/23/2019 Cycle 5 FOLFOX 05/07/2019 CTs 05/23/2019-decrease in liver lesions.  No new or progressive findings. Cycle 6 FOLFOX 05/27/2019 Cycle 7 FOLFOX 06/14/2019 Cycle 8 FOLFOX 07/02/2019-Udenyca held Cycle 9 FOLFOX 07/16/2019-Udenyca held Cycle 10 FOLFOX 07/30/2019-Udenyca held CT abdomen/pelvis 08/13/2019-mild improvement in previously noted liver lesions MRI abdomen 08/13/2019-3 liver lesions  Cycle 1 FOLFIRI/Avastin 09/17/2019 Cycle 2 FOLFIRI/Avastin 10/01/2019 Cycle 3 FOLFIRI/Avastin 10/15/2019 Cycle 4 FOLFIRI/Avastin 11/05/2019 Cycle 5 FOLFIRI/Avastin 11/19/2019 CTs 11/27/2019-stable liver lesions; no new liver lesions.  No other evidence of metastatic disease in the abdomen or pelvis. Cycle 1 FOLFIRI/Panitumumab 12/03/2019 Cycle 2 FOLFIRI/Panitumumab 12/17/2019 (Panitumumab dose reduced due to rash) Cycle 3 FOLFIRI/Panitumumab 01/01/2020 Cycle 4 FOLFIRI/Panitumumab 01/14/2020 Cycle 5 FOLFIRI/Panitumumab 01/28/2020 CT abdomen/pelvis 02/06/2020-decreased size of right hepatic lesions, subtle lesion in the lateral left hepatic lobe-not clearly  identified, no evidence of  disease progression Cycle 6 FOLFIRI/Panitumumab 02/11/2020 Cycle 7 FOLFIRI/Panitumumab 03/10/2020 Cycle 8 FOLFIRI/Panitumumab 03/26/2020 Cycle 9 FOLFIRI/Panitumumab 04/14/2020 CT abdomen/pelvis 04/26/2020-mild enlargement of 2 liver lesions on initial report, repeat review with radiology consistent with stable disease Cycle 10 FOLFIRI/Panitumumab 04/29/2020 MRI liver 05/19/2020-right hepatic lobe lesions diminished in size when compared to the study of March 2021.  Lesion in hepatic subsegment VI with very limited assessment as well due to adjacent bowel. 05/20/2020 referred to interventional radiology to consider ablation of liver lesions CT abdomen 06/30/2020 prior to planned ablation procedure, enlargement of segment 7 liver lesion and segment 6 liver lesion.  Interval recurrence of segment 2 lesion measuring 4.5 cm, ablation aborted 07/23/2020-diagnostic laparoscopy, robotic assisted insertion of hepatic infusion pump, portal lymphadenectomy-granulomatous inflammation compatible with sarcoidosis, core tumor biopsy segment 6, infiltrating adenocarcinoma 08/02/2020-cycle 1 FUDR at 50% dose 08/16/2020-FOLFIRI/panitumumab CTs 10/25/2020-significantly decreased size of hepatic metastases 10/25/2020 cycle 6 FOLFIRI/Panitumumab, FUDR # 4 11/09/2020-cycle 7 FOLFIRI/panitumumab 01/06/2021-diagnostic laparoscopy, partial hepatectomy-segment 2, segment 6, microwave ablation segment 7-3 tumors identified, segment 2 and 6 tumors resected-adenocarcinoma with negative margins, segment 2 lesion with less than 1 mm hepatic parenchymal margin 03/28/2021-CTs with slight increase in right pleural effusion, CEA 1.5 05/16/2021-increasing ill-defined lesion in segment 7, MRI-posttreatment change of the liver with heterogenous hypoenhancement adjacent to hepatic segment 7 ablation site suspicious for local recurrence January 2023-SBRT to segment 7 ablation cavity 08/08/2021-stable posttreatment changes in liver, segment 7 ablation  cavity unchanged, no new metastatic disease, increasing pleural effusion 10/17/2021, CT-interval increase in left hepatic lobe segment 3 liver lesion measuring up to 2 cm 11/17/2021-completed 60 Gray in 8 fractions to segment 3 liver lesion 01/02/2022-CTs-stable 03/17/2022-thoracentesis right pleural effusion 03/27/2022-CTs-increased size of hypoattenuating lesion at the deep margin of the previously treated lesion right hepatic lobe 04/05/2019-MRI with increased size of a heterogenous lesion at the deep margin of a previously treated right hepatic lobe lesion, small right pleural effusion 04/17/2022-5-FU and FUDR and panitumumab 05/07/2022 FUDR No. 1 at 100% plus 5-FU 07/10/2022 FUDR No. 4 at 100% 07/24/2022 glycerin flush plus 5-FU plus panitumumab 08/21/2022 CTs-slight decrease conspicuity of enhancing portion at the deep margin of the right hepatic lobe treatment cavity, new subcentimeter hypodensity in the medial left hepatic lobe, decreased subcentimeter cystic lesion in the left hepatic lobe, stable right pleural effusion CEA elevated 01/01/2023 01/08/2023-CTs-increased hypoenhancing area at the deep margin of right hepatic lobe resection cavity SBRT to right liver lesion 01/29/2023 - 02/02/2023, 4500 cGy 03/19/2023: CTs-slight decrease in size of right hepatic lesion, increased right lower lobe pulmonary nodule 04/12/2023 PET-CT-intense hypermetabolic activity in the posterior right hepatic lobe, small focus of activity in the left hepatic lobe, intense hypermetabolic activity involving an enlarging right lower lobe nodule, other pulmonary nodules have less intense avidity and are stable 05/07/2023 - 06/15/2023 FOLFIRI, panitumumab, FUDR 07/08/2023 CTs: Decrease in right lower lobe nodule, stable hepatic lesions small left effusion, CEA 2.7, pleural thickening and interlobular septal thickening-possible lymphatic spread, treatment placed on hold February 2025 SBRT to lung metastasis 09/05/2023 increase in  size of hepatic lesions, stable right upper lobe nodule, interlobular septal thickening-question lymphatic spread within the right lung 09/24/2023 FOLFOX/panitumumab  History of colon polyps Microcytic anemia January 2019 Sarcoidosis Coronary artery disease Port-A-Cath placement, interventional radiology, 03/04/2019 Thrombocytopenia, mild thrombocytopenia predating chemotherapy Oxaliplatin neuropathy Dyspnea on exertion-CT chest 02/25/2020 negative for PE, infection, pneumonitis; scheduled for PFTs 03/10/2020, followed by pulmonary    Disposition: Mr. Bouillon has metastatic colon cancer.  Recent imaging confirms evidence  of disease progression.  The CEA is elevated.  He is scheduled to resume treatment with FOLFOX/panitumumab on 09/24/2023.  He is scheduled for treatment with Dr. Maryruth Hancock at [facility].  He plans to continue follow-up at [facility].  I am available to see him as needed.  He will return for an office visit in 6 months.  Thornton Papas, MD  09/21/2023  8:21 AM

## 2023-09-24 DIAGNOSIS — Z5111 Encounter for antineoplastic chemotherapy: Secondary | ICD-10-CM | POA: Diagnosis not present

## 2023-09-24 DIAGNOSIS — Z5112 Encounter for antineoplastic immunotherapy: Secondary | ICD-10-CM | POA: Diagnosis not present

## 2023-09-24 DIAGNOSIS — C180 Malignant neoplasm of cecum: Secondary | ICD-10-CM | POA: Diagnosis not present

## 2023-09-24 DIAGNOSIS — R197 Diarrhea, unspecified: Secondary | ICD-10-CM | POA: Diagnosis not present

## 2023-09-24 DIAGNOSIS — C189 Malignant neoplasm of colon, unspecified: Secondary | ICD-10-CM | POA: Diagnosis not present

## 2023-09-24 DIAGNOSIS — C7800 Secondary malignant neoplasm of unspecified lung: Secondary | ICD-10-CM | POA: Diagnosis not present

## 2023-09-24 DIAGNOSIS — K219 Gastro-esophageal reflux disease without esophagitis: Secondary | ICD-10-CM | POA: Diagnosis not present

## 2023-09-24 DIAGNOSIS — C787 Secondary malignant neoplasm of liver and intrahepatic bile duct: Secondary | ICD-10-CM | POA: Diagnosis not present

## 2023-09-24 DIAGNOSIS — J90 Pleural effusion, not elsewhere classified: Secondary | ICD-10-CM | POA: Diagnosis not present

## 2023-09-24 DIAGNOSIS — E039 Hypothyroidism, unspecified: Secondary | ICD-10-CM | POA: Diagnosis not present

## 2023-09-24 NOTE — Telephone Encounter (Signed)
 Submitted a Prior Authorization request to [employer] for DUPIXENT via CoverMyMeds. Will update once we receive a response.  Key: Luis [name] has been received and placed in PAP Pending folder.

## 2023-09-25 ENCOUNTER — Other Ambulatory Visit (HOSPITAL_COMMUNITY): Payer: Self-pay

## 2023-09-25 ENCOUNTER — Encounter: Payer: Self-pay | Admitting: Oncology

## 2023-09-25 MED ORDER — FLUTICASONE PROPIONATE 50 MCG/ACT NA SUSP: 2.0000 | Freq: Every day | NASAL | 3 refills | Status: DC

## 2023-09-25 NOTE — Telephone Encounter (Signed)
 Returned call to patient from CRM - he wanted clarification on dosing  Lorraina Spring, PharmD, MPH, BCPS, CPP Clinical Pharmacist (Rheumatology and Pulmonology)

## 2023-09-25 NOTE — Telephone Encounter (Signed)
 Received notification from [employer] regarding a prior authorization for DUPIXENT. Authorization has been APPROVED from 09/24/2023 to 06/11/2024. Approval letter sent to scan center.  Per test claim, copay for 28 days supply is $100  Patient can fill through [facility]: [phone]   He states he would like to get started on it and also apply for PAP if possible.  Scheduled for Dupixent new start on 10/10/2023. Will plan to use sample  Nalleli Largent, PharmD, MPH, BCPS, CPP Clinical Pharmacist (Rheumatology and Pulmonology)

## 2023-09-28 ENCOUNTER — Telehealth: Payer: Self-pay | Admitting: Internal Medicine

## 2023-09-28 NOTE — Telephone Encounter (Signed)
 Patient returned staff call and stated can leave voice message and he will call right back.

## 2023-09-28 NOTE — Telephone Encounter (Signed)
 Pt aware of lab results ./cy   Luis [name] Your LDL cholesterol is above goal. Goal should be at least less than 70. However, I would not recommend adjusting your current medications any further. Continue current medications. Marlyse Single, PA-C   09/24/2023 10:49 AM

## 2023-10-03 ENCOUNTER — Other Ambulatory Visit: Payer: Self-pay | Admitting: Interventional Cardiology

## 2023-10-05 ENCOUNTER — Telehealth: Payer: Self-pay

## 2023-10-05 ENCOUNTER — Other Ambulatory Visit (HOSPITAL_COMMUNITY): Payer: Self-pay

## 2023-10-05 NOTE — Telephone Encounter (Signed)
Submitted Patient Assistance Application to [employer] for DUPIXENT along with provider portion, patient portion, PA, medication list, insurance card copy and income documents. Will update patient when we receive a response.  Phone #: [phone] Fax #: [phone]

## 2023-10-08 DIAGNOSIS — R21 Rash and other nonspecific skin eruption: Secondary | ICD-10-CM | POA: Diagnosis not present

## 2023-10-08 DIAGNOSIS — E039 Hypothyroidism, unspecified: Secondary | ICD-10-CM | POA: Diagnosis not present

## 2023-10-08 DIAGNOSIS — C19 Malignant neoplasm of rectosigmoid junction: Secondary | ICD-10-CM | POA: Diagnosis not present

## 2023-10-08 DIAGNOSIS — C7801 Secondary malignant neoplasm of right lung: Secondary | ICD-10-CM | POA: Diagnosis not present

## 2023-10-08 DIAGNOSIS — C180 Malignant neoplasm of cecum: Secondary | ICD-10-CM | POA: Diagnosis not present

## 2023-10-08 DIAGNOSIS — K219 Gastro-esophageal reflux disease without esophagitis: Secondary | ICD-10-CM | POA: Diagnosis not present

## 2023-10-08 DIAGNOSIS — Z5111 Encounter for antineoplastic chemotherapy: Secondary | ICD-10-CM | POA: Diagnosis not present

## 2023-10-08 DIAGNOSIS — C787 Secondary malignant neoplasm of liver and intrahepatic bile duct: Secondary | ICD-10-CM | POA: Diagnosis not present

## 2023-10-08 DIAGNOSIS — C189 Malignant neoplasm of colon, unspecified: Secondary | ICD-10-CM | POA: Diagnosis not present

## 2023-10-08 DIAGNOSIS — J90 Pleural effusion, not elsewhere classified: Secondary | ICD-10-CM | POA: Diagnosis not present

## 2023-10-09 NOTE — Progress Notes (Addendum)
 HPI Patient presents today to [facility] to see pharmacy team for Dupixent  new start. Patient has asthma-COPD overlap. Past medical history includes HFrEF, CAD, HTN, malignant neoplasm to liver, colon cancer, adenocarcinoma of cecum, sarcoidosis. Receiving chemotherapy with recent change in treatment regimen,.  He noticed increased swelling and fluid in legs and started furosemide  20mg  daily. Creatinine from 10/08/23 wnl.   Patient has developed dermatologic reaction to new chemotherapeutic regimen - red rash on chest and top of head. Reports itching. Oncology prescribing minocycline and topical metronidazole to manage per patient  Respiratory Medications Current regimen: Breztri  160-9-4.33mcg (2 puffs twice daily) and montelukast  10mg  nightly Patient reports no known adherence challenges  OBJECTIVE No Known Allergies  Outpatient Encounter Medications as of 10/10/2023  Medication Sig   acetaminophen  (TYLENOL ) 500 MG tablet Take 500-1,000 mg by mouth every 6 (six) hours as needed for moderate pain.   albuterol  (PROVENTIL ) (2.5 MG/3ML) 0.083% nebulizer solution INHALE 3 ML BY NEBULIZATION EVERY 6 HOURS AS NEEDED FOR WHEEZING OR SHORTNESS OF BREATH   albuterol  (VENTOLIN  HFA) 108 (90 Base) MCG/ACT inhaler INHALE 1-2 PUFFS BY MOUTH EVERY 6 HOURS AS NEEDED FOR WHEEZE OR SHORTNESS OF BREATH   BREZTRI  AEROSPHERE 160-9-4.8 MCG/ACT AERO INHALE 2 PUFFS INTO THE LUNGS IN THE MORNING AND AT BEDTIME. RINSE MOUTH AFTER USE.   cetirizine  (ZYRTEC ) 10 MG tablet Take 1 tablet (10 mg total) by mouth daily.   Cholecalciferol  (VITAMIN D3) 2000 units capsule Take 2,000 Units by mouth daily.   clopidogrel  (PLAVIX ) 75 MG tablet Take 1 tablet (75 mg total) by mouth daily.   ezetimibe  (ZETIA ) 10 MG tablet TAKE 1 TABLET BY MOUTH EVERY DAY   fluticasone  (FLONASE ) 50 MCG/ACT nasal spray Place 2 sprays into both nostrils daily.   furosemide  (LASIX ) 20 MG tablet Take 20 mg by mouth daily as needed for fluid or  edema.   gabapentin  (NEURONTIN ) 100 MG capsule Take 100 mg by mouth daily as needed (for pain).   ibuprofen (ADVIL) 200 MG tablet Take 200-400 mg by mouth every 6 (six) hours as needed for moderate pain or mild pain.   levothyroxine  (SYNTHROID ) 50 MCG tablet Take 50 mcg by mouth daily.   MAGNESIUM  PO Take 1 tablet by mouth daily.   metoprolol  succinate (TOPROL -XL) 50 MG 24 hr tablet TAKE 1 TABLET (50 MG TOTAL) BY MOUTH EVERY EVENING. TAKE WITH OR IMMEDIATELY FOLLOWING A MEAL.   montelukast  (SINGULAIR ) 10 MG tablet Take 1 tablet (10 mg total) by mouth at bedtime.   Multiple Vitamins-Minerals (VITRUM SENIOR) TABS Take 1 tablet by mouth daily.   mupirocin ointment (BACTROBAN) 2 % Apply 1 Application topically 2 (two) times daily.   naproxen  sodium (ALEVE ) 220 MG tablet Take 440 mg by mouth 2 (two) times daily as needed (pain).   nitroGLYCERIN  (NITROSTAT ) 0.4 MG SL tablet PLACE 1 TABLET UNDER THE TONGUE EVERY 5 MINUTES AS NEEDED FOR CHEST PAIN FOR 3 DOSES   ranolazine  (RANEXA ) 500 MG 12 hr tablet TAKE 1 TABLET BY MOUTH TWICE A DAY   rosuvastatin  (CRESTOR ) 20 MG tablet Take 1 tablet (20 mg total) by mouth daily.   traMADol  (ULTRAM ) 50 MG tablet Take 2 tablets (100 mg total) by mouth every 6 (six) hours as needed. (Patient taking differently: Take 100 mg by mouth every 6 (six) hours as needed for moderate pain (pain score 4-6).)   No facility-administered encounter medications on file as of 10/10/2023.     Immunization History  Administered Date(s) Administered   DTaP /  IPV 09/25/2012   Fluad Quad(high Dose 65+) 02/19/2019, 03/14/2022   Influenza Split 03/27/2014   Influenza, High Dose Seasonal PF 03/27/2014, 01/27/2017, 02/22/2018, 03/12/2021   Influenza-Unspecified 02/24/2017, 03/10/2020   PFIZER Comirnaty(Gray Top)Covid-19 Tri-Sucrose Vaccine 07/21/2019   PFIZER(Purple Top)SARS-COV-2 Vaccination 07/11/2019, 07/21/2019, 01/26/2020, 10/26/2020   Pneumococcal Conjugate-13 03/27/2014    Pneumococcal Polysaccharide-23 11/10/2009   Pneumococcal-Unspecified 03/12/2014   Tdap 03/24/2011, 09/25/2012, 05/28/2021   Zoster Recombinant(Shingrix) 09/01/2018, 02/19/2019   Zoster, Live 03/21/2010     PFTs    Latest Ref Rng & Units 03/10/2020    3:36 PM 08/30/2017    3:59 PM  PFT Results  FVC-Pre L 2.94  P 3.14   FVC-Predicted Pre % 68  P 72   FVC-Post L 2.90  P 3.18   FVC-Predicted Post % 67  P 73   Pre FEV1/FVC % % 66  P 70   Post FEV1/FCV % % 68  P 73   FEV1-Pre L 1.93  P 2.19   FEV1-Predicted Pre % 62  P 69   FEV1-Post L 1.96  P 2.31   DLCO uncorrected ml/min/mmHg 19.10  P 17.73   DLCO UNC% % 75  P 52   DLCO corrected ml/min/mmHg 21.40  P 20.68   DLCO COR %Predicted % 84  P 61   DLVA Predicted % 118  P 98   TLC L 5.02  P 4.99   TLC % Predicted % 69  P 68   RV % Predicted % 76  P 71     P Preliminary result    Eosinophils Most recent blood eosinophil count was 900 cells/microL taken on 10/08/2023.   Assessment   Biologics training for dupilumab  (Dupixent )  Goals of therapy: Mechanism: human monoclonal IgG4 antibody that inhibits interleukin-4 and interleukin-13 cytokine-induced responses, including release of proinflammatory cytokines, chemokines, and IgE Reviewed that Dupixent  is add-on medication and patient must continue maintenance inhaler regimen. Response to therapy: may take 4 months to determine efficacy. Discussed that patients generally feel improvement sooner than 4 months.  Side effects: injection site reaction (6-18%), antibody development (5-16%), ophthalmic conjunctivitis (2-16%), transient blood eosinophilia (1-2%)  Dose: 600mg  at Week 0 (administered today in clinic) followed by 300mg  every 14 days thereafter  Administration/Storage:  Reviewed administration sites of thigh or abdomen (at least 2-3 inches away from abdomen). Reviewed the upper arm is only appropriate if caregiver is administering injection  Do not shake pen/syringe as this could  lead to product foaming or precipitation. Do not use if solution is discolored or contains particulate matter or if window on prefilled pen is yellow (indicates pen has been used).  Reviewed storage of medication in refrigerator. Reviewed that Dupixent  can be stored at room temperature in unopened carton for up to 14 days.  Access: Approval of Dupixent  through: insurance Patient assistance is pending. Patient provided with phone number to call [employer] to express financial hardship  Patient self-administered Dupixent  300mg /11ml x 2 (total dose 600mg ) in right lower abdomen and left lower abdomen using sample Dupixent  300mg /65mL autoinjector pen NDC: [phone] Lot: [id number] Expiration: 06/11/2025  Patient monitored for 30 minutes for adverse reaction.  Patient tolerated well.  Injection site noted. Patient denies itchiness and irritation at injection., No swelling or redness noted., and Reviewed injection site reaction management with patient verbally and printed information for review in AVS  Medication Reconciliation  A drug regimen assessment was performed, including review of allergies, interactions, disease-state management, dosing and immunization history. Medications were reviewed with the patient,  including name, instructions, indication, goals of therapy, potential side effects, importance of adherence, and safe use.  Drug interaction(s): none noted  PLAN Continue Dupixent  300mg  every 14 days.  Next dose is due 10/24/2023 and every 14 days thereafter. Rx sent to: [facility]: [phone] .  Patient will call [employer] PAP tomorrow to express financial hardship with affording Dupixent  for pt assistance screening. If denied patient assistance, he is comfortable with paying for Dupixent  copay through insurance ($100 for 28 day supply) Continue maintenance inhaler regimen of: Breztri  160-9-4.8mcg (2 puffs twice daily) and montelukast  10mg  nightly Increase furosemide  40mg  daily.  Creatinine from 10/08/23 wnl. D/w Dr. Annella today with agreement in plan. Pt has f/u with Dr. Annella scheduled for next week 10/17/2023. Consider BMP repeat to assess renal function Reviewed oncology notes from 10/08/23 - He is receiving FOLFOX+panitumumab . Due to rash and fatigue will hold oxaliplatin  and PMAb today. Will resume with dose reduction in 2 weeks  All questions encouraged and answered.  Instructed patient to reach out with any further questions or concerns.  Thank you for allowing pharmacy to participate in this patient's care.  This appointment required 45 minutes of patient care (this includes precharting, chart review, review of results, face-to-face care, etc.).

## 2023-10-09 NOTE — Patient Instructions (Incomplete)
 Your next Dupixent dose is due on 10/24/2023, 11/07/2023, and every 14 days thereafter  CONTINUE Breztri  160-9-4.62mcg (2 puffs twice daily) and montelukast  10mg  nightly  Your prescription will be shipped from [facility]. Their phone number is [phone] Demaris Fillers will call to schedule the first shipment and confirm address. They will mail your medication to your home.  You will need to be seen by your provider in 3 to 4 months to assess how Dupixent is working for you. Please ensure you have a follow-up appointment scheduled in August or September 2025. Call our clinic if you need to make this appointment.  Stay up to date on all routine vaccines: influenza, pneumonia, COVID19, Shingles  How to manage an injection site reaction: Remember the 5 C's: COUNTER - leave on the counter at least 30 minutes but up to overnight to bring medication to room temperature. This may help prevent stinging COLD - place something cold (like an ice gel pack or cold water bottle) on the injection site just before cleansing with alcohol. This may help reduce pain CLARITIN  - use Claritin  (generic name is loratadine ) for the first two weeks of treatment or the day of, the day before, and the day after injecting. This will help to minimize injection site reactions CORTISONE CREAM - apply if injection site is irritated and itching CALL ME - if injection site reaction is bigger than the size of your fist, looks infected, blisters, or if you develop hives

## 2023-10-10 ENCOUNTER — Ambulatory Visit: Admitting: Pharmacist

## 2023-10-10 DIAGNOSIS — J4489 Other specified chronic obstructive pulmonary disease: Secondary | ICD-10-CM

## 2023-10-10 DIAGNOSIS — F17210 Nicotine dependence, cigarettes, uncomplicated: Secondary | ICD-10-CM

## 2023-10-10 DIAGNOSIS — Z7189 Other specified counseling: Secondary | ICD-10-CM | POA: Diagnosis not present

## 2023-10-10 MED ORDER — DUPIXENT 300 MG/2ML ~~LOC~~ SOAJ
300.0000 mg | SUBCUTANEOUS | 5 refills | Status: DC
  Filled 2023-10-22 (×2): qty 4, 28d supply, fill #0
  Filled 2023-11-07: qty 4, 28d supply, fill #1
  Filled 2023-12-18: qty 4, 28d supply, fill #2
  Filled 2024-01-10: qty 4, 28d supply, fill #3
  Filled 2024-01-31 – 2024-02-04 (×2): qty 4, 28d supply, fill #4
  Filled 2024-02-28: qty 4, 28d supply, fill #5

## 2023-10-10 NOTE — Telephone Encounter (Signed)
 Patient provided with [employer] phone number today to call and express financial hardship (started Dupixent in office today and is more than likely willing to pay for Dupixent with $100 copay through insurance if needed)

## 2023-10-11 ENCOUNTER — Other Ambulatory Visit: Payer: Self-pay | Admitting: Pulmonary Disease

## 2023-10-11 DIAGNOSIS — J441 Chronic obstructive pulmonary disease with (acute) exacerbation: Secondary | ICD-10-CM

## 2023-10-15 ENCOUNTER — Other Ambulatory Visit: Payer: Self-pay | Admitting: Pharmacist

## 2023-10-15 NOTE — Progress Notes (Signed)
 Patient started Dupixent in office on 10/10/2023 for asthma-COPD overlap. No issues   Past medical history includes HFrEF, CAD, HTN, malignant neoplasm to liver, colon cancer, adenocarcinoma of cecum, sarcoidosis. Receiving chemotherapy with recent change in treatment regimen,.   He noticed increased swelling and fluid in legs and started furosemide  20mg  daily. Creatinine from 10/08/23 wnl.  Patient has developed dermatologic reaction to new chemotherapeutic regimen - red rash on chest and top of head. Reports itching. Oncology prescribing minocycline and topical metronidazole to manage per patient  Plan:  Continue Dupixent 300mg  every 14 days.  Next dose is due 10/24/2023 and every 14 days thereafter. Patient was denied patient assistance and is comfortable paying copay through his insurance Continue maintenance inhaler regimen of: Breztri  160-9-4.8mcg (2 puffs twice daily) and montelukast  10mg  nightly Increase furosemide  40mg  daily. Creatinine from 10/08/23 wnl. D/w Dr. Marygrace Snellen today with agreement in plan. Pt has f/u with Dr. Marygrace Snellen scheduled for next week 10/17/2023. Consider BMP repeat to assess renal function Reviewed oncology notes from 10/08/23 - He is receiving FOLFOX+panitumumab . Due to rash and fatigue will hold oxaliplatin  and PMAb today. Will resume with dose reduction in 2 weeks  Geraldene Kleine, PharmD, MPH, BCPS, CPP Clinical Pharmacist (Rheumatology and Pulmonology)

## 2023-10-15 NOTE — Telephone Encounter (Addendum)
 Received fax from [employer] for DUPIXENT patient assistance, patient's application has been DENIED due to not meeting Part D financial criteria.    Phone #: [phone] Fax #: [phone]  Spoke with patient about denial. He will move forward with paying for Dupixent through insurance. He has been advised that Demaris Fillers will reach out to set up shipment to home  Geraldene Kleine, PharmD, MPH, BCPS, CPP Clinical Pharmacist (Rheumatology and Pulmonology)

## 2023-10-16 ENCOUNTER — Other Ambulatory Visit: Payer: Self-pay | Admitting: Nurse Practitioner

## 2023-10-17 ENCOUNTER — Encounter: Payer: Self-pay | Admitting: Pulmonary Disease

## 2023-10-17 ENCOUNTER — Ambulatory Visit: Payer: Medicare PPO | Admitting: Pulmonary Disease

## 2023-10-17 VITALS — BP 124/64 | HR 50 | Temp 97.7°F | Ht 71.0 in | Wt 224.6 lb

## 2023-10-17 DIAGNOSIS — J90 Pleural effusion, not elsewhere classified: Secondary | ICD-10-CM | POA: Diagnosis not present

## 2023-10-17 DIAGNOSIS — J4550 Severe persistent asthma, uncomplicated: Secondary | ICD-10-CM | POA: Diagnosis not present

## 2023-10-17 DIAGNOSIS — H6091 Unspecified otitis externa, right ear: Secondary | ICD-10-CM | POA: Diagnosis not present

## 2023-10-17 MED ORDER — FUROSEMIDE 20 MG PO TABS: 20.0000 mg | ORAL_TABLET | Freq: Every day | ORAL | 6 refills | Status: DC | PRN

## 2023-10-17 NOTE — Patient Instructions (Signed)
 Lasix  refilled  Can increase flonase  to 2 sprays once a day, or try 1 spray twice a day and can use up to 2 sprays twice a day  No other changes to medications  Return to clinic in 3 months or sooner as needed

## 2023-10-17 NOTE — Progress Notes (Signed)
 @Patient  ID: [name], male    DOB: 1941-05-31, 83 y.o.   MRN: [id number]  Chief Complaint  Patient presents with   Follow-up    Doing well.  Dupixent helping sx a lot.  Started new chemo med at [facility].  Does not remember name.    Referring provider: Annette Barters, MD  HPI:   83 y.o. man whom we are seeing in follow-up of multiple pulmonary issues including burnt out sarcoid, asthma, ongoing chronic of right pleural effusion appears to be decreasing over time.  Most recent oncology note from [facility] reviewed.  Doing okay.  Got a couple additional courses of steroids in the interim.  Seen by Dr. Dione Franks in interim.  Sounded clear.  Dupixent started in the interim.  He thinks it helped.  First injection just a week or 2 ago.  Started new chemotherapy regimen middle of last month.  Got terrible rash from it.  Rough start.  Nodule in CT scan of the lung stable.  Small pleural effusion noted on CT scan, overall improved persistently over the last several months.  He is taking Lasix  as needed.  Controlling his pleural effusion it seems like with this.  He notices more shortness of breath will take it for a few days and things improved.  HPI at initial visit: Overall, disorders are largely unchanged.  Formally followed with Dr. McQuaid for sarcoidosis.  This has been quiescent for some time.  Most recent CT scan reviewed which reveals on my interpretation stable bilateral fibrosis, scattered calcified granulomas in the lung as well as significant calcified hilar and mediastinal lymphadenopathy and sequela of prior lung surgery.  He has been seen in clinic recently by Josephus Nida.  Primary complaint was dyspnea on exertion as well as cough.  These persist.  Chest imaging obtained as above.  PFTs obtained last week which reveal suggestion of mild to moderate restriction on spirometry without significant bronchodilator response.  Moderate restriction confirmed with total lung capacity 69% of  predicted.  DLCO corrects to within normal limits with adjustment for hemoglobin.  Cough and shortness of breath coincide with worsening nasal congestion.  Postnasal drip.  Not really using meds to address this this.  He is tolerating chemotherapy well.  Review of labs indicate stable hemoglobin without concern for symptomatic anemia at this time.   Questionaires / Pulmonary Flowsheets:   ACT:  Asthma Control Test ACT Total Score  09/13/2023  9:29 AM 7    MMRC: mMRC Dyspnea Scale mMRC Score  08/09/2020  3:42 PM 0    Epworth:      No data to display          Tests:   FENO:  No results found for: "NITRICOXIDE"  PFT:    Latest Ref Rng & Units 03/10/2020    3:36 PM 08/30/2017    3:59 PM  PFT Results  FVC-Pre L 2.94  P 3.14   FVC-Predicted Pre % 68  P 72   FVC-Post L 2.90  P 3.18   FVC-Predicted Post % 67  P 73   Pre FEV1/FVC % % 66  P 70   Post FEV1/FCV % % 68  P 73   FEV1-Pre L 1.93  P 2.19   FEV1-Predicted Pre % 62  P 69   FEV1-Post L 1.96  P 2.31   DLCO uncorrected ml/min/mmHg 19.10  P 17.73   DLCO UNC% % 75  P 52   DLCO corrected ml/min/mmHg 21.40  P 20.68  DLCO COR %Predicted % 84  P 61   DLVA Predicted % 118  P 98   TLC L 5.02  P 4.99   TLC % Predicted % 69  P 68   RV % Predicted % 76  P 71     P Preliminary result  Personally reviewed and interpreted as normal spirometry, reduced DLCO, moderate reduced TLC  WALK:     08/28/2022    3:17 PM 08/28/2017   12:00 PM  SIX MIN WALK  Supplimental Oxygen during Test? (L/min)  No  Tech Comments: Patient was able to finish walk with sats in the 90's. pt walked a fast pace, tolerated walk well.     Imaging: Personally reviewed and as per EMR and discussion in this note  No results found.  Lab Results: Personally reviewed, eosinophils routinely 200-300 CBC    Component Value Date/Time   WBC 11.9 (H) 08/26/2023 0500   RBC 3.61 (L) 08/26/2023 0500   HGB 11.8 (L) 08/26/2023 0500   HGB 13.4 02/16/2023 0837    HCT 35.3 (L) 08/26/2023 0500   HCT 39.9 02/16/2023 0837   PLT 173 08/26/2023 0500   PLT 178 02/16/2023 0837   MCV 97.8 08/26/2023 0500   MCV 91 02/16/2023 0837   MCH 32.7 08/26/2023 0500   MCHC 33.4 08/26/2023 0500   RDW 16.0 (H) 08/26/2023 0500   RDW 16.1 (H) 02/16/2023 0837   LYMPHSABS 0.1 (L) 08/25/2023 1227   MONOABS 0.6 08/25/2023 1227   EOSABS 0.0 08/25/2023 1227   BASOSABS 0.0 08/25/2023 1227    BMET    Component Value Date/Time   NA 132 (L) 08/26/2023 0500   NA 136 02/16/2023 0837   K 4.5 08/26/2023 0500   CL 99 08/26/2023 0500   CO2 23 08/26/2023 0500   GLUCOSE 243 (H) 08/26/2023 0500   BUN 32 (H) 08/26/2023 0500   BUN 12 02/16/2023 0837   CREATININE 1.28 (H) 08/26/2023 0500   CREATININE 0.83 06/07/2020 1119   CREATININE 1.02 05/16/2016 0937   CALCIUM  8.7 (L) 08/26/2023 0500   GFRNONAA 56 (L) 08/26/2023 0500   GFRNONAA >60 06/07/2020 1119   GFRAA >60 03/10/2020 0855    BNP    Component Value Date/Time   BNP 179.7 (H) 08/24/2023 1818   BNP 43.4 05/16/2016 0937    ProBNP    Component Value Date/Time   PROBNP 69 08/01/2021 0857   PROBNP 60.0 08/10/2014 1052    Specialty Problems       Pulmonary Problems   Pulmonary fibrosis (HCC)   DOE (dyspnea on exertion)   Pleural effusion   Acute exacerbation of COPD with asthma (HCC)   Acute hypoxemic respiratory failure (HCC)   Acute respiratory failure with hypoxia (HCC)    No Known Allergies  Immunization History  Administered Date(s) Administered   DTaP / IPV 09/25/2012   Fluad Quad(high Dose 65+) 02/19/2019, 03/14/2022   Influenza Split 03/27/2014   Influenza, High Dose Seasonal PF 03/27/2014, 01/27/2017, 02/22/2018, 03/12/2021   Influenza-Unspecified 02/24/2017, 03/10/2020   PFIZER Comirnaty(Gray Top)Covid-19 Tri-Sucrose Vaccine 07/21/2019   PFIZER(Purple Top)SARS-COV-2 Vaccination 07/11/2019, 07/21/2019, 01/26/2020, 10/26/2020   Pneumococcal Conjugate-13 03/27/2014   Pneumococcal  Polysaccharide-23 11/10/2009   Pneumococcal-Unspecified 03/12/2014   Tdap 03/24/2011, 09/25/2012, 05/28/2021   Zoster Recombinant(Shingrix) 09/01/2018, 02/19/2019   Zoster, Live 03/21/2010    Past Medical History:  Diagnosis Date   Adenocarcinoma of cecum (HCC) 06/21/2017   Arthritis    "mid back; hands; knees" (02/24/2015)   Asthmatic bronchitis with acute  exacerbation 06/27/2021   Basal cell carcinoma    left shoulder; mid chest; right eyelid (02/24/2015)   CAD (coronary artery disease)    a. 08/2014 Cath/PCI: LM nl, LAD 30p, D1 95 (2.25x12 Resolute Integrity DES), LCX small, RI 100 (attempted PCI) - branches fill via L->L collats, RCA dominant, nl, RPDA/PLA nl, EF 60^. b. 02/24/2015 PCI CTO of Ramus DES x2.   Chronic bronchitis (HCC)    hx   Diverticulosis 05/2005   Elevated lipase    Fuchs' corneal dystrophy    History of adenomatous polyp of colon 05/2005   8 mm adenoma   History of blood transfusion    "related to some of my surgeries"   Hyperlipidemia    Hypertension    Iron deficiency anemia due to chronic blood loss 06/21/2017   Pneumonia    Prostate cancer (HCC) 2011   S/P seed implant   Sarcoidosis    Thrombocytopenia (HCC)    a. Noted on prior labs, unclear of what w/u done.   Trifascicular block     Tobacco History: Social History   Tobacco Use  Smoking Status Light Smoker   Types: Cigars  Smokeless Tobacco Never  Tobacco Comments   02/24/2015 "quit cigarettes ~ 2000 ago but smokes cigars about once every two months. Updated 01/16/2023 am   Ready to quit: Not Answered Counseling given: Not Answered Tobacco comments: 02/24/2015 "quit cigarettes ~ 2000 ago but smokes cigars about once every two months. Updated 01/16/2023 am   Continue to not smoke  Outpatient Encounter Medications as of 10/17/2023  Medication Sig   acetaminophen  (TYLENOL ) 500 MG tablet Take 500-1,000 mg by mouth every 6 (six) hours as needed for moderate pain.   albuterol  (PROVENTIL ) (2.5  MG/3ML) 0.083% nebulizer solution INHALE 3 ML BY NEBULIZATION EVERY 6 HOURS AS NEEDED FOR WHEEZING OR SHORTNESS OF BREATH   albuterol  (VENTOLIN  HFA) 108 (90 Base) MCG/ACT inhaler INHALE 1-2 PUFFS BY MOUTH EVERY 6 HOURS AS NEEDED FOR WHEEZE OR SHORTNESS OF BREATH   BREZTRI  AEROSPHERE 160-9-4.8 MCG/ACT AERO inhaler INHALE 2 PUFFS INTO THE LUNGS IN THE MORNING AND AT BEDTIME. RINSE MOUTH AFTER USE.   cetirizine  (ZYRTEC ) 10 MG tablet Take 1 tablet (10 mg total) by mouth daily.   Cholecalciferol  (VITAMIN D3) 2000 units capsule Take 2,000 Units by mouth daily.   clopidogrel  (PLAVIX ) 75 MG tablet Take 1 tablet (75 mg total) by mouth daily.   Dupilumab (DUPIXENT) 300 MG/2ML SOAJ Inject 300 mg into the skin every 14 (fourteen) days. **loading dose received in clinic on 10/10/23**   ezetimibe  (ZETIA ) 10 MG tablet TAKE 1 TABLET BY MOUTH EVERY DAY   fluticasone  (FLONASE ) 50 MCG/ACT nasal spray Place 2 sprays into both nostrils daily.   gabapentin  (NEURONTIN ) 100 MG capsule Take 100 mg by mouth daily as needed (for pain).   ibuprofen (ADVIL) 200 MG tablet Take 200-400 mg by mouth every 6 (six) hours as needed for moderate pain or mild pain.   levothyroxine  (SYNTHROID ) 50 MCG tablet Take 50 mcg by mouth daily.   MAGNESIUM  PO Take 1 tablet by mouth daily.   metoprolol  succinate (TOPROL -XL) 50 MG 24 hr tablet TAKE 1 TABLET (50 MG TOTAL) BY MOUTH EVERY EVENING. TAKE WITH OR IMMEDIATELY FOLLOWING A MEAL.   montelukast  (SINGULAIR ) 10 MG tablet Take 1 tablet (10 mg total) by mouth at bedtime.   Multiple Vitamins-Minerals (VITRUM SENIOR) TABS Take 1 tablet by mouth daily.   mupirocin ointment (BACTROBAN) 2 % Apply 1 Application topically 2 (  two) times daily.   naproxen  sodium (ALEVE ) 220 MG tablet Take 440 mg by mouth 2 (two) times daily as needed (pain).   nitroGLYCERIN  (NITROSTAT ) 0.4 MG SL tablet PLACE 1 TABLET UNDER THE TONGUE EVERY 5 MINUTES AS NEEDED FOR CHEST PAIN FOR 3 DOSES   ranolazine  (RANEXA ) 500 MG 12 hr  tablet TAKE 1 TABLET BY MOUTH TWICE A DAY   rosuvastatin  (CRESTOR ) 20 MG tablet Take 1 tablet (20 mg total) by mouth daily.   traMADol  (ULTRAM ) 50 MG tablet Take 2 tablets (100 mg total) by mouth every 6 (six) hours as needed. (Patient taking differently: Take 100 mg by mouth every 6 (six) hours as needed for moderate pain (pain score 4-6).)   [DISCONTINUED] furosemide  (LASIX ) 20 MG tablet Take 20 mg by mouth daily as needed for fluid or edema.   furosemide  (LASIX ) 20 MG tablet Take 1 tablet (20 mg total) by mouth daily as needed for fluid or edema.   No facility-administered encounter medications on file as of 10/17/2023.     Review of Systems  N/a  Physical Exam  BP 124/64 (BP Location: Right Arm, Patient Position: Sitting, Cuff Size: Normal)   Pulse (!) 50   Temp 97.7 F (36.5 C) (Oral)   Ht 5\' 11"  (1.803 m)   Wt 224 lb 9.6 oz (101.9 kg)   SpO2 95%   BMI 31.33 kg/m   Wt Readings from Last 5 Encounters:  10/17/23 224 lb 9.6 oz (101.9 kg)  09/21/23 233 lb (105.7 kg)  09/13/23 234 lb (106.1 kg)  08/26/23 227 lb 14.4 oz (103.4 kg)  08/15/23 235 lb 6.4 oz (106.8 kg)    BMI Readings from Last 5 Encounters:  10/17/23 31.33 kg/m  09/21/23 32.50 kg/m  09/13/23 32.64 kg/m  08/26/23 32.24 kg/m  08/15/23 33.78 kg/m     Physical Exam General: Well-appearing, no acute distress Eyes: EOMI, no icterus Neck: No JVD supple Respiratory: Clear and even, symmetric air entry bilaterally Cardiovascular: Regular rhythm, no murmurs Abdomen: Nondistended, bowel sounds present Psych: Normal mood, full affect   Assessment & Plan:   Recurrent right pleural effusion: Suspect related to local inflammation due to metastasis to liver, liver resection.  Cytology negative.  Improvement in dyspnea with drainage.  Serial thoracentesis as needed, will need to hold Plavix  for 5 days in advance which he is well aware of and help coordinate.  Last thoracentesis 10/2022 with most recent scans  at  [facility] serial scans revealing small pleural effusion.  Overall seems improved.  Asthma, severe persistent: Day-to-day variation in symptoms.  Historically, Breztri  seems to control things okay.  Unfortunately worse over the last several months with recurrent courses of steroids.  Eosinophils elevated in the past.  Started on Dupixent in interim.  He thinks things have helped albeit has not been on that long.  Continue inhalers and Dupixent moving forward.  Sarcoidosis: Burnt out, no evidence of active disease on recent imaging.  Return in 3 months (on 01/17/2024) for f/u Dr. Marygrace Snellen.   Guerry Leek, MD 10/17/2023

## 2023-10-19 ENCOUNTER — Encounter: Payer: Self-pay | Admitting: Oncology

## 2023-10-19 ENCOUNTER — Other Ambulatory Visit (HOSPITAL_COMMUNITY): Payer: Self-pay

## 2023-10-22 ENCOUNTER — Other Ambulatory Visit: Payer: Self-pay

## 2023-10-22 ENCOUNTER — Other Ambulatory Visit (HOSPITAL_COMMUNITY): Payer: Self-pay

## 2023-10-22 DIAGNOSIS — C189 Malignant neoplasm of colon, unspecified: Secondary | ICD-10-CM | POA: Diagnosis not present

## 2023-10-22 DIAGNOSIS — Z5112 Encounter for antineoplastic immunotherapy: Secondary | ICD-10-CM | POA: Diagnosis not present

## 2023-10-22 DIAGNOSIS — Z5111 Encounter for antineoplastic chemotherapy: Secondary | ICD-10-CM | POA: Diagnosis not present

## 2023-10-22 DIAGNOSIS — L271 Localized skin eruption due to drugs and medicaments taken internally: Secondary | ICD-10-CM | POA: Diagnosis not present

## 2023-10-22 DIAGNOSIS — C7801 Secondary malignant neoplasm of right lung: Secondary | ICD-10-CM | POA: Diagnosis not present

## 2023-10-22 DIAGNOSIS — C180 Malignant neoplasm of cecum: Secondary | ICD-10-CM | POA: Diagnosis not present

## 2023-10-22 DIAGNOSIS — C787 Secondary malignant neoplasm of liver and intrahepatic bile duct: Secondary | ICD-10-CM | POA: Diagnosis not present

## 2023-10-22 DIAGNOSIS — T451X5A Adverse effect of antineoplastic and immunosuppressive drugs, initial encounter: Secondary | ICD-10-CM | POA: Diagnosis not present

## 2023-10-22 DIAGNOSIS — K219 Gastro-esophageal reflux disease without esophagitis: Secondary | ICD-10-CM | POA: Diagnosis not present

## 2023-10-22 DIAGNOSIS — J90 Pleural effusion, not elsewhere classified: Secondary | ICD-10-CM | POA: Diagnosis not present

## 2023-10-22 NOTE — Progress Notes (Signed)
 Specialty Pharmacy Initial Fill Coordination Note  Luis [name] is a 83 y.o. male contacted today regarding initial fill of specialty medication(s) Dupilumab (Dupixent)   Patient requested Delivery   Delivery date: 10/24/23   Verified address: [address]   [name] [address]   Medication will be filled on 10/23/23.   Patient is aware of $100 copayment. Payment info collected and forwarded to Tamra at [facility].

## 2023-10-24 ENCOUNTER — Other Ambulatory Visit (HOSPITAL_COMMUNITY): Payer: Self-pay

## 2023-10-24 ENCOUNTER — Other Ambulatory Visit: Payer: Self-pay

## 2023-10-29 ENCOUNTER — Encounter: Payer: Self-pay | Admitting: Pulmonary Disease

## 2023-11-06 ENCOUNTER — Ambulatory Visit: Admitting: Primary Care

## 2023-11-06 VITALS — BP 155/74 | HR 65 | Temp 97.6°F | Ht 71.0 in | Wt 227.0 lb

## 2023-11-06 DIAGNOSIS — D860 Sarcoidosis of lung: Secondary | ICD-10-CM

## 2023-11-06 DIAGNOSIS — F17210 Nicotine dependence, cigarettes, uncomplicated: Secondary | ICD-10-CM

## 2023-11-06 DIAGNOSIS — J441 Chronic obstructive pulmonary disease with (acute) exacerbation: Secondary | ICD-10-CM | POA: Diagnosis not present

## 2023-11-06 MED ORDER — METHYLPREDNISOLONE ACETATE 80 MG/ML IJ SUSP
80.0000 mg | Freq: Once | INTRAMUSCULAR | Status: AC
  Administered 2023-11-06: 80 mg via INTRAMUSCULAR

## 2023-11-06 MED ORDER — AZITHROMYCIN 250 MG PO TABS
ORAL_TABLET | ORAL | 0 refills | Status: DC
Start: 2023-11-06 — End: 2024-01-04

## 2023-11-06 MED ORDER — PREDNISONE 10 MG PO TABS
ORAL_TABLET | ORAL | 0 refills | Status: DC
Start: 2023-11-06 — End: 2024-01-02

## 2023-11-06 NOTE — Patient Instructions (Signed)
-  COPD WITH ASTHMA: COPD with asthma overlap means you have both chronic obstructive pulmonary disease and asthma, which can cause breathing difficulties. To manage your recent exacerbation, you received a Depo steroid shot today and will start a prednisone  taper tomorrow. You should use your nebulizer 2-3 times daily and continue taking Breztri , Dupixent , albuterol , and montelukast .  -BRONCHITIS: Bacterial bronchitis is an infection of the airways that causes a productive cough with mucus. You have been prescribed a Z-Pak (azithromycin ) to treat the infection and should continue taking Robitussin DM every 4-6 hours to manage your cough.  -SARCOIDOSIS: Sarcoidosis is an inflammatory disease that can affect various organs, including the lungs. Due to your recent symptoms and past history, we will order a CT scan to evaluate the left lower lobe area for potential recurrence. We discussed the possibility of sarcoidosis recurrence based on your past experiences and recent imaging findings.  -SPINAL STENOSIS: Spinal stenosis is a narrowing of the spaces within your spine, which can cause pain. You have not taken pain medications for the past month. While on prednisone , you should avoid NSAIDs like ibuprofen and naproxen  due to potential interactions.  INSTRUCTIONS:  Please follow up with the CT scan as ordered to evaluate for potential sarcoidosis recurrence. Continue using your nebulizer 2-3 times daily and take your medications as prescribed. Avoid NSAIDs while on prednisone . If your symptoms worsen or you have any concerns, please contact our office.  Orders: Depo medrol  80mg  IM x 1 CT chest without contrast in 1-2 weeks   Follow-up August with Dr. Marygrace Snellen

## 2023-11-06 NOTE — Progress Notes (Signed)
 @Patient  ID: Luis [name], male    DOB: 30-May-1941, 83 y.o.   MRN: [id number]  No chief complaint on file.   Referring provider: Annette Barters, MD  HPI: 83 year old male, light smoker. PMH significant for HTN, CAD, carotid stenosis, COPD with asthma, sarcoidosis, pleural effusion, pulmonary fibrosis, adenocarcinoma cecum.   11/06/2023 Discussed the use of AI scribe software for clinical note transcription with the patient, who gave verbal consent to proceed.  History of Present Illness   Luis [name] "Luis [name]" is an 83 year old male with COPD and sarcoidosis who presents with worsening respiratory symptoms.  He has been experiencing worsening respiratory symptoms over the past week, characterized by a productive cough with light green mucus. The cough began last Monday and drinking water sometimes helps to loosen the mucus, providing temporary relief for a couple of hours.  He has increased the use of his rescue inhaler and nebulizer to about four to five times a day, but this has not provided significant relief. He last used prednisone  in April, prior to his last follow-up visit. He is concerned about the possibility of sarcoidosis recurrence, as he experienced similar symptoms at the time of his initial diagnosis. After his diagnosis, he was on a long-term prednisone  regimen, which was tapered over several years.  His current medications for COPD and asthma include Breztri , Dupixent , albuterol  (both nebulizer and inhaler), and Singulair  (montelukast ). He has a history of smoking, which contributes to his COPD diagnosis.      No Known Allergies  Immunization History  Administered Date(s) Administered   DTaP / IPV 09/25/2012   Fluad Quad(high Dose 65+) 02/19/2019, 03/14/2022   Influenza Split 03/27/2014   Influenza, High Dose Seasonal PF 03/27/2014, 01/27/2017, 02/22/2018, 03/12/2021   Influenza-Unspecified 02/24/2017, 03/10/2020   PFIZER Comirnaty(Gray Top)Covid-19  Tri-Sucrose Vaccine 07/21/2019   PFIZER(Purple Top)SARS-COV-2 Vaccination 07/11/2019, 07/21/2019, 01/26/2020, 10/26/2020   Pneumococcal Conjugate-13 03/27/2014   Pneumococcal Polysaccharide-23 11/10/2009   Pneumococcal-Unspecified 03/12/2014   Tdap 03/24/2011, 09/25/2012, 05/28/2021   Zoster Recombinant(Shingrix) 09/01/2018, 02/19/2019   Zoster, Live 03/21/2010    Past Medical History:  Diagnosis Date   Adenocarcinoma of cecum (HCC) 06/21/2017   Arthritis    "mid back; hands; knees" (02/24/2015)   Asthmatic bronchitis with acute exacerbation 06/27/2021   Basal cell carcinoma    left shoulder; mid chest; right eyelid (02/24/2015)   CAD (coronary artery disease)    a. 08/2014 Cath/PCI: LM nl, LAD 30p, D1 95 (2.25x12 Resolute Integrity DES), LCX small, RI 100 (attempted PCI) - branches fill via L->L collats, RCA dominant, nl, RPDA/PLA nl, EF 60^. b. 02/24/2015 PCI CTO of Ramus DES x2.   Chronic bronchitis (HCC)    hx   Diverticulosis 05/2005   Elevated lipase    Fuchs' corneal dystrophy    History of adenomatous polyp of colon 05/2005   8 mm adenoma   History of blood transfusion    "related to some of my surgeries"   Hyperlipidemia    Hypertension    Iron deficiency anemia due to chronic blood loss 06/21/2017   Pneumonia    Prostate cancer (HCC) 2011   S/P seed implant   Sarcoidosis    Thrombocytopenia (HCC)    a. Noted on prior labs, unclear of what w/u done.   Trifascicular block     Tobacco History: Social History   Tobacco Use  Smoking Status Light Smoker   Types: Cigars  Smokeless Tobacco Never  Tobacco Comments   02/24/2015 "quit cigarettes ~  2000 ago but smokes cigars about once every two months. Updated 01/16/2023 am   Ready to quit: Not Answered Counseling given: Not Answered Tobacco comments: 02/24/2015 "quit cigarettes ~ 2000 ago but smokes cigars about once every two months. Updated 01/16/2023 am   Outpatient Medications Prior to Visit  Medication Sig Dispense  Refill   acetaminophen  (TYLENOL ) 500 MG tablet Take 500-1,000 mg by mouth every 6 (six) hours as needed for moderate pain.     albuterol  (PROVENTIL ) (2.5 MG/3ML) 0.083% nebulizer solution INHALE 3 ML BY NEBULIZATION EVERY 6 HOURS AS NEEDED FOR WHEEZING OR SHORTNESS OF BREATH 75 mL 5   albuterol  (VENTOLIN  HFA) 108 (90 Base) MCG/ACT inhaler INHALE 1-2 PUFFS BY MOUTH EVERY 6 HOURS AS NEEDED FOR WHEEZE OR SHORTNESS OF BREATH 18 each 3   BREZTRI  AEROSPHERE 160-9-4.8 MCG/ACT AERO inhaler INHALE 2 PUFFS INTO THE LUNGS IN THE MORNING AND AT BEDTIME. RINSE MOUTH AFTER USE. 10.7 each 5   cetirizine  (ZYRTEC ) 10 MG tablet Take 1 tablet (10 mg total) by mouth daily. 90 tablet 11   Cholecalciferol  (VITAMIN D3) 2000 units capsule Take 2,000 Units by mouth daily.     clopidogrel  (PLAVIX ) 75 MG tablet Take 1 tablet (75 mg total) by mouth daily. 90 tablet 2   Dupilumab  (DUPIXENT ) 300 MG/2ML SOAJ Inject 300 mg into the skin every 14 (fourteen) days. **loading dose received in clinic on 10/10/23** 4 mL 5   ezetimibe  (ZETIA ) 10 MG tablet TAKE 1 TABLET BY MOUTH EVERY DAY 90 tablet 3   fluticasone  (FLONASE ) 50 MCG/ACT nasal spray Place 2 sprays into both nostrils daily. 48 mL 3   furosemide  (LASIX ) 20 MG tablet Take 1 tablet (20 mg total) by mouth daily as needed for fluid or edema. 30 tablet 6   gabapentin  (NEURONTIN ) 100 MG capsule Take 100 mg by mouth daily as needed (for pain).     ibuprofen (ADVIL) 200 MG tablet Take 200-400 mg by mouth every 6 (six) hours as needed for moderate pain or mild pain.     levothyroxine  (SYNTHROID ) 50 MCG tablet Take 50 mcg by mouth daily.     MAGNESIUM  PO Take 1 tablet by mouth daily.     metoprolol  succinate (TOPROL -XL) 50 MG 24 hr tablet TAKE 1 TABLET (50 MG TOTAL) BY MOUTH EVERY EVENING. TAKE WITH OR IMMEDIATELY FOLLOWING A MEAL. 90 tablet 3   montelukast  (SINGULAIR ) 10 MG tablet Take 1 tablet (10 mg total) by mouth at bedtime. 30 tablet 11   Multiple Vitamins-Minerals (VITRUM SENIOR)  TABS Take 1 tablet by mouth daily.     mupirocin ointment (BACTROBAN) 2 % Apply 1 Application topically 2 (two) times daily.     naproxen  sodium (ALEVE ) 220 MG tablet Take 440 mg by mouth 2 (two) times daily as needed (pain).     nitroGLYCERIN  (NITROSTAT ) 0.4 MG SL tablet PLACE 1 TABLET UNDER THE TONGUE EVERY 5 MINUTES AS NEEDED FOR CHEST PAIN FOR 3 DOSES 25 tablet 11   ranolazine  (RANEXA ) 500 MG 12 hr tablet TAKE 1 TABLET BY MOUTH TWICE A DAY 180 tablet 3   rosuvastatin  (CRESTOR ) 20 MG tablet Take 1 tablet (20 mg total) by mouth daily. 90 tablet 2   traMADol  (ULTRAM ) 50 MG tablet Take 2 tablets (100 mg total) by mouth every 6 (six) hours as needed. (Patient taking differently: Take 100 mg by mouth every 6 (six) hours as needed for moderate pain (pain score 4-6).) 10 tablet 0   No facility-administered medications prior to visit.  Review of Systems  Review of Systems  Constitutional: Negative.   HENT:  Positive for congestion.   Respiratory:  Positive for cough and wheezing.   Cardiovascular: Negative.    Physical Exam  There were no vitals taken for this visit. Physical Exam Constitutional:      Appearance: He is well-developed.  HENT:     Head: Normocephalic and atraumatic.  Cardiovascular:     Rate and Rhythm: Normal rate and regular rhythm.  Pulmonary:     Effort: Pulmonary effort is normal. No tachypnea, accessory muscle usage or respiratory distress.     Breath sounds: No stridor. Wheezing present.  Skin:    General: Skin is warm and dry.  Neurological:     General: No focal deficit present.     Mental Status: He is alert and oriented to person, place, and time.  Psychiatric:        Mood and Affect: Mood normal.        Behavior: Behavior normal.      Lab Results:  CBC    Component Value Date/Time   WBC 11.9 (H) 08/26/2023 0500   RBC 3.61 (L) 08/26/2023 0500   HGB 11.8 (L) 08/26/2023 0500   HGB 13.4 02/16/2023 0837   HCT 35.3 (L) 08/26/2023 0500   HCT 39.9  02/16/2023 0837   PLT 173 08/26/2023 0500   PLT 178 02/16/2023 0837   MCV 97.8 08/26/2023 0500   MCV 91 02/16/2023 0837   MCH 32.7 08/26/2023 0500   MCHC 33.4 08/26/2023 0500   RDW 16.0 (H) 08/26/2023 0500   RDW 16.1 (H) 02/16/2023 0837   LYMPHSABS 0.1 (L) 08/25/2023 1227   MONOABS 0.6 08/25/2023 1227   EOSABS 0.0 08/25/2023 1227   BASOSABS 0.0 08/25/2023 1227    BMET    Component Value Date/Time   NA 132 (L) 08/26/2023 0500   NA 136 02/16/2023 0837   K 4.5 08/26/2023 0500   CL 99 08/26/2023 0500   CO2 23 08/26/2023 0500   GLUCOSE 243 (H) 08/26/2023 0500   BUN 32 (H) 08/26/2023 0500   BUN 12 02/16/2023 0837   CREATININE 1.28 (H) 08/26/2023 0500   CREATININE 0.83 06/07/2020 1119   CREATININE 1.02 05/16/2016 0937   CALCIUM  8.7 (L) 08/26/2023 0500   GFRNONAA 56 (L) 08/26/2023 0500   GFRNONAA >60 06/07/2020 1119   GFRAA >60 03/10/2020 0855    BNP    Component Value Date/Time   BNP 179.7 (H) 08/24/2023 1818   BNP 43.4 05/16/2016 0937    ProBNP    Component Value Date/Time   PROBNP 69 08/01/2021 0857   PROBNP 60.0 08/10/2014 1052    Imaging: No results found.   Assessment & Plan:   1. Pulmonary sarcoidosis (HCC) (Primary) - CT Chest Wo Contrast; Future  2. Acute exacerbation of COPD with asthma (HCC)  Assessment and Plan    COPD with asthma COPD with asthma overlap, primarily managed with Breztri , Dupixent , albuterol , and montelukast . Recent exacerbation with increased wheezing and productive cough, suggestive of asthma features. Increased use of rescue inhaler and nebulizer without significant relief. Informed about risks of long-term prednisone  use, including immunosuppression, drug-induced diabetes, and osteoporosis. Decision made to administer a Depo steroid shot and start a prednisone  taper to manage symptoms. - Administer Depo-medrol  80mg  - Prescribe prednisone  taper starting tomorrow. - Instruct to use nebulizer 2-3 times daily. - Continue Breztri ,  Dupixent , albuterol , and montelukast .  Bacterial bronchitis Acute bacterial bronchitis suspected due to productive cough with light green  sputum and increased mucus production.  - Prescribe Z-Pak (azithromycin ) for suspected bacterial bronchitis. - Continue Robitussin DM every 4-6 hours for cough management.  Sarcoidosis Sarcoidosis with previous lung involvement. CT chest in March showed left lower lobe consolidation measuring 2.9 x 1.6cm, favored to refect atelectasis  follow-up in 3 months recommended  - Order CT scan to evaluate left lower lobe area for potential sarcoidosis recurrence vs malignancy   Spinal stenosis Spinal stenosis managed with pain medications. Currently not taking pain medications for the past month. Advised to avoid NSAIDs while on prednisone  due to potential interactions. - Advise to avoid ibuprofen and naproxen  while on prednisone .  Colon cancer - Diagnosed in February 2019 with recurrent/metastatic disease in September 2020.  Following with Dr. Cyrena Drilling with [facility], last seen in April 2025.   Plan C2 5FU alone Oxali to be reduced in future cycles PMAb will be reduced to 4.5 mg/kg in future cycles  Pleural effusion Thoracentesis PRN  Pump flush - Glycerin Q79 days. Next due 6/14.    Antonio Baumgarten, NP 11/06/2023

## 2023-11-06 NOTE — Addendum Note (Signed)
 Addended by: Ferdie Bakken T on: 11/06/2023 10:40 AM   Modules accepted: Orders

## 2023-11-07 ENCOUNTER — Other Ambulatory Visit: Payer: Self-pay | Admitting: Pharmacy Technician

## 2023-11-07 ENCOUNTER — Other Ambulatory Visit: Payer: Self-pay

## 2023-11-07 DIAGNOSIS — C180 Malignant neoplasm of cecum: Secondary | ICD-10-CM | POA: Diagnosis not present

## 2023-11-07 DIAGNOSIS — E039 Hypothyroidism, unspecified: Secondary | ICD-10-CM | POA: Diagnosis not present

## 2023-11-07 DIAGNOSIS — C787 Secondary malignant neoplasm of liver and intrahepatic bile duct: Secondary | ICD-10-CM | POA: Diagnosis not present

## 2023-11-07 DIAGNOSIS — R21 Rash and other nonspecific skin eruption: Secondary | ICD-10-CM | POA: Diagnosis not present

## 2023-11-07 DIAGNOSIS — K219 Gastro-esophageal reflux disease without esophagitis: Secondary | ICD-10-CM | POA: Diagnosis not present

## 2023-11-07 DIAGNOSIS — Z5112 Encounter for antineoplastic immunotherapy: Secondary | ICD-10-CM | POA: Diagnosis not present

## 2023-11-07 DIAGNOSIS — J90 Pleural effusion, not elsewhere classified: Secondary | ICD-10-CM | POA: Diagnosis not present

## 2023-11-07 DIAGNOSIS — C189 Malignant neoplasm of colon, unspecified: Secondary | ICD-10-CM | POA: Diagnosis not present

## 2023-11-07 DIAGNOSIS — C7800 Secondary malignant neoplasm of unspecified lung: Secondary | ICD-10-CM | POA: Diagnosis not present

## 2023-11-07 DIAGNOSIS — Z79890 Hormone replacement therapy (postmenopausal): Secondary | ICD-10-CM | POA: Diagnosis not present

## 2023-11-07 DIAGNOSIS — Z5111 Encounter for antineoplastic chemotherapy: Secondary | ICD-10-CM | POA: Diagnosis not present

## 2023-11-07 NOTE — Progress Notes (Signed)
 Specialty Pharmacy Refill Coordination Note  Luis [name] is a 83 y.o. male contacted today regarding refills of specialty medication(s) Dupilumab  (Dupixent )   Patient requested Delivery   Delivery date: 11/20/23   Verified address: [address], [place] [address]   Medication will be filled on 11/19/23.

## 2023-11-08 ENCOUNTER — Other Ambulatory Visit: Payer: Self-pay

## 2023-11-08 ENCOUNTER — Other Ambulatory Visit

## 2023-11-08 NOTE — Progress Notes (Signed)
 Clinical Intervention Note  Clinical Intervention Notes: Patient reported starting prednisone  and azithromycin  for bronchitis. Checked for drug interactions and no DDIs identified with Dupixent .   Clinical Intervention Outcomes: Prevention of an adverse drug event   Advertising account planner

## 2023-11-10 ENCOUNTER — Other Ambulatory Visit: Payer: Self-pay | Admitting: Nurse Practitioner

## 2023-11-10 DIAGNOSIS — J441 Chronic obstructive pulmonary disease with (acute) exacerbation: Secondary | ICD-10-CM

## 2023-11-12 ENCOUNTER — Telehealth: Payer: Self-pay

## 2023-11-12 NOTE — Telephone Encounter (Signed)
 Copied from CRM [id number]. Topic: Clinical - Medication Question >> Nov 09, 2023  8:08 AM Margarette Shawl wrote: Reason for CRM:   Pt is contacting to clinic concerning the prednisone  he was prescribed. He was advised by Dr. Sueanne Emerald not to take Tylenol  while he is taking prednisone .  He is wondering what he can take for pain relief. He has ibuprofen, Aleve , and Advil available.  Requests call back  # [phone]   Please advise Beth which medication you prefer pt to take for pain.

## 2023-11-12 NOTE — Telephone Encounter (Signed)
 He can take tylenol , told him to avoid Nsaids (ibuprofen, aleve  or advil) while on prednisone  if possible

## 2023-11-13 NOTE — Telephone Encounter (Signed)
 I called the pt and there was no answer- left detailed msg per [facility] on his cell ok per DPR. Nothing further needed.

## 2023-11-19 ENCOUNTER — Other Ambulatory Visit: Payer: Self-pay

## 2023-11-21 ENCOUNTER — Ambulatory Visit
Admission: RE | Admit: 2023-11-21 | Discharge: 2023-11-21 | Disposition: A | Discharge status: Home / Self Care | Source: Ambulatory Visit | Attending: Primary Care | Admitting: Primary Care

## 2023-11-21 DIAGNOSIS — Z947 Corneal transplant status: Secondary | ICD-10-CM | POA: Diagnosis not present

## 2023-11-21 DIAGNOSIS — R911 Solitary pulmonary nodule: Secondary | ICD-10-CM | POA: Diagnosis not present

## 2023-11-21 DIAGNOSIS — H52203 Unspecified astigmatism, bilateral: Secondary | ICD-10-CM | POA: Diagnosis not present

## 2023-11-21 DIAGNOSIS — Z961 Presence of intraocular lens: Secondary | ICD-10-CM | POA: Diagnosis not present

## 2023-11-21 DIAGNOSIS — H04123 Dry eye syndrome of bilateral lacrimal glands: Secondary | ICD-10-CM | POA: Diagnosis not present

## 2023-11-21 DIAGNOSIS — D860 Sarcoidosis of lung: Secondary | ICD-10-CM

## 2023-11-21 DIAGNOSIS — H524 Presbyopia: Secondary | ICD-10-CM | POA: Diagnosis not present

## 2023-11-22 DIAGNOSIS — E039 Hypothyroidism, unspecified: Secondary | ICD-10-CM | POA: Diagnosis not present

## 2023-11-22 DIAGNOSIS — R197 Diarrhea, unspecified: Secondary | ICD-10-CM | POA: Diagnosis not present

## 2023-11-22 DIAGNOSIS — C180 Malignant neoplasm of cecum: Secondary | ICD-10-CM | POA: Diagnosis not present

## 2023-11-22 DIAGNOSIS — K219 Gastro-esophageal reflux disease without esophagitis: Secondary | ICD-10-CM | POA: Diagnosis not present

## 2023-11-22 DIAGNOSIS — J90 Pleural effusion, not elsewhere classified: Secondary | ICD-10-CM | POA: Diagnosis not present

## 2023-11-22 DIAGNOSIS — Z5112 Encounter for antineoplastic immunotherapy: Secondary | ICD-10-CM | POA: Diagnosis not present

## 2023-11-22 DIAGNOSIS — C787 Secondary malignant neoplasm of liver and intrahepatic bile duct: Secondary | ICD-10-CM | POA: Diagnosis not present

## 2023-11-22 DIAGNOSIS — Z5111 Encounter for antineoplastic chemotherapy: Secondary | ICD-10-CM | POA: Diagnosis not present

## 2023-11-22 DIAGNOSIS — C7801 Secondary malignant neoplasm of right lung: Secondary | ICD-10-CM | POA: Diagnosis not present

## 2023-11-22 DIAGNOSIS — C189 Malignant neoplasm of colon, unspecified: Secondary | ICD-10-CM | POA: Diagnosis not present

## 2023-12-05 ENCOUNTER — Ambulatory Visit: Payer: Self-pay | Admitting: Primary Care

## 2023-12-05 DIAGNOSIS — C787 Secondary malignant neoplasm of liver and intrahepatic bile duct: Secondary | ICD-10-CM | POA: Diagnosis not present

## 2023-12-05 DIAGNOSIS — C189 Malignant neoplasm of colon, unspecified: Secondary | ICD-10-CM | POA: Diagnosis not present

## 2023-12-05 DIAGNOSIS — R918 Other nonspecific abnormal finding of lung field: Secondary | ICD-10-CM | POA: Diagnosis not present

## 2023-12-05 NOTE — Progress Notes (Signed)
 Please let patient know CT chest imaging showed resolution of left lower lobe pneumonia. Stable chronic changes related to the patient's underlying sarcoidosis.  Incidental findings extensive multi-vessel coronary artery calcification. Enlargement of the central pulmonary arteries in keeping with changes of pulmonary arterial hypertension. Dilation of the ascending aorta measuring 4.2 cm in greatest diameter, stable since remote prior examination of 05/28/2021. Recommend annual imaging followup by CTA or MRA. Recommend following up with PCP or cardiology regarding these non-urgent findings.  Liver lesions which may be related to patient hx colon cancer, may want to discuss additional imaging with oncologist if indicated   If patient wants to discuss findings in more detail please schedule virtual visit

## 2023-12-06 DIAGNOSIS — C189 Malignant neoplasm of colon, unspecified: Secondary | ICD-10-CM | POA: Diagnosis not present

## 2023-12-06 DIAGNOSIS — J90 Pleural effusion, not elsewhere classified: Secondary | ICD-10-CM | POA: Diagnosis not present

## 2023-12-06 DIAGNOSIS — C787 Secondary malignant neoplasm of liver and intrahepatic bile duct: Secondary | ICD-10-CM | POA: Diagnosis not present

## 2023-12-06 DIAGNOSIS — Z5111 Encounter for antineoplastic chemotherapy: Secondary | ICD-10-CM | POA: Diagnosis not present

## 2023-12-06 DIAGNOSIS — Z5112 Encounter for antineoplastic immunotherapy: Secondary | ICD-10-CM | POA: Diagnosis not present

## 2023-12-07 NOTE — Progress Notes (Signed)
 I called and spoke to pt. Pt informed of Beth's note and verbalized understanding. Pt requested an in person visit which has been scheduled for January 02, 2024 with Landry Ferrari, NP. NFN

## 2023-12-13 ENCOUNTER — Other Ambulatory Visit (HOSPITAL_COMMUNITY): Payer: Self-pay

## 2023-12-17 DIAGNOSIS — S91102A Unspecified open wound of left great toe without damage to nail, initial encounter: Secondary | ICD-10-CM | POA: Diagnosis not present

## 2023-12-18 ENCOUNTER — Other Ambulatory Visit: Payer: Self-pay

## 2023-12-18 NOTE — Progress Notes (Signed)
 Specialty Pharmacy Refill Coordination Note  Luis [name] is a 83 y.o. male contacted today regarding refills of specialty medication(s) Dupilumab  (Dupixent )   Patient requested Delivery   Delivery date: 12/19/23   Verified address: [address], [place] [address]   Medication will be filled on 12/18/23.

## 2023-12-20 DIAGNOSIS — R197 Diarrhea, unspecified: Secondary | ICD-10-CM | POA: Diagnosis not present

## 2023-12-20 DIAGNOSIS — J90 Pleural effusion, not elsewhere classified: Secondary | ICD-10-CM | POA: Diagnosis not present

## 2023-12-20 DIAGNOSIS — C787 Secondary malignant neoplasm of liver and intrahepatic bile duct: Secondary | ICD-10-CM | POA: Diagnosis not present

## 2023-12-20 DIAGNOSIS — E039 Hypothyroidism, unspecified: Secondary | ICD-10-CM | POA: Diagnosis not present

## 2023-12-20 DIAGNOSIS — Z5112 Encounter for antineoplastic immunotherapy: Secondary | ICD-10-CM | POA: Diagnosis not present

## 2023-12-20 DIAGNOSIS — C7801 Secondary malignant neoplasm of right lung: Secondary | ICD-10-CM | POA: Diagnosis not present

## 2023-12-20 DIAGNOSIS — C189 Malignant neoplasm of colon, unspecified: Secondary | ICD-10-CM | POA: Diagnosis not present

## 2023-12-20 DIAGNOSIS — C19 Malignant neoplasm of rectosigmoid junction: Secondary | ICD-10-CM | POA: Diagnosis not present

## 2023-12-20 DIAGNOSIS — Z5111 Encounter for antineoplastic chemotherapy: Secondary | ICD-10-CM | POA: Diagnosis not present

## 2023-12-20 DIAGNOSIS — K219 Gastro-esophageal reflux disease without esophagitis: Secondary | ICD-10-CM | POA: Diagnosis not present

## 2023-12-31 ENCOUNTER — Other Ambulatory Visit: Payer: Self-pay | Admitting: Nurse Practitioner

## 2024-01-02 ENCOUNTER — Encounter: Payer: Self-pay | Admitting: Primary Care

## 2024-01-02 ENCOUNTER — Telehealth: Payer: Self-pay | Admitting: Primary Care

## 2024-01-02 ENCOUNTER — Ambulatory Visit: Admitting: Primary Care

## 2024-01-02 ENCOUNTER — Other Ambulatory Visit (INDEPENDENT_AMBULATORY_CARE_PROVIDER_SITE_OTHER)

## 2024-01-02 VITALS — BP 114/66 | HR 101 | Temp 98.0°F | Ht 70.5 in | Wt 222.4 lb

## 2024-01-02 DIAGNOSIS — M7989 Other specified soft tissue disorders: Secondary | ICD-10-CM | POA: Diagnosis not present

## 2024-01-02 DIAGNOSIS — J4550 Severe persistent asthma, uncomplicated: Secondary | ICD-10-CM

## 2024-01-02 DIAGNOSIS — R0602 Shortness of breath: Secondary | ICD-10-CM

## 2024-01-02 LAB — BASIC METABOLIC PANEL WITH GFR
BUN: 9 mg/dL (ref 6–23)
CO2: 30 meq/L (ref 19–32)
Calcium: 8 mg/dL — ABNORMAL LOW (ref 8.4–10.5)
Chloride: 100 meq/L (ref 96–112)
Creatinine, Ser: 0.69 mg/dL (ref 0.40–1.50)
GFR: 86.03 mL/min (ref >60.00)
Glucose, Bld: 111 mg/dL — ABNORMAL HIGH (ref 70–99)
Potassium: 3.8 meq/L (ref 3.5–5.1)
Sodium: 140 meq/L (ref 135–145)

## 2024-01-02 LAB — POCT EXHALED NITRIC OXIDE: FeNO level (ppb): 13

## 2024-01-02 LAB — BRAIN NATRIURETIC PEPTIDE: Pro B Natriuretic peptide (BNP): 221 pg/mL — ABNORMAL HIGH (ref 0.0–100.0)

## 2024-01-02 MED ORDER — PREDNISONE 10 MG PO TABS: ORAL_TABLET | ORAL | 0 refills | Status: DC

## 2024-01-02 NOTE — Addendum Note (Signed)
 Addended by: DAYNE SHERRY RAMAN on: 01/02/2024 09:54 AM   Modules accepted: Level of Service

## 2024-01-02 NOTE — Patient Instructions (Addendum)
  VISIT SUMMARY: Today, we discussed your ongoing respiratory issues, including difficulty breathing, wheezing, and coughing, as well as your leg swelling. We reviewed your current medications and treatment plans, and made adjustments to better manage your symptoms. We also discussed your ongoing chemotherapy for liver lesions and the need for follow-up imaging for your aortic aneurysm.  YOUR PLAN: -CHRONIC OBSTRUCTIVE PULMONARY DISEASE (COPD): COPD is a chronic lung disease that makes it hard to breathe. We will start you on prednisone  20 mg daily for two weeks, then taper to 10 mg daily, to help manage your symptoms. We will also perform an Aviane test to assess airway inflammation. Continue using your Breztri  inhaler and Dupixent  injections. We will send a message to Dr. Annella for approval of this prednisone  plan.  -SARCOIDOSIS: Sarcoidosis is a condition where inflammatory cells grow in different parts of your body, usually the lungs. Your condition is stable with no new pulmonary infiltrates, and your previous pneumonia has resolved.  -SMALL RIGHT PLEURAL EFFUSION: A pleural effusion is a buildup of fluid between the tissues that line the lungs and the chest. Your small right pleural effusion is stable and not causing new symptoms.  -CORONARY ARTERY CALCIFICATION: This is a buildup of calcium  in the walls of the heart's arteries, which can affect blood flow. You are aware of this condition and are taking Crestor  20 mg. You will follow up with a new cardiologist since your previous one retired.  -AORTIC ANEURYSM: An aortic aneurysm is an enlargement of the aorta, the main blood vessel that supplies blood to the body. Your aneurysm is stable at 4.2 cm. We will order annual imaging (CTA or MRA) to monitor it.  -LIVER LESIONS UNDER TREATMENT FOR CANCER: You are currently undergoing chemotherapy for liver lesions related to cancer. You are under the care of Dr. Brenna at [facility] and have  two more chemotherapy sessions remaining.  -LEG SWELLING: You have chronic swelling in your legs, more pronounced in the right leg. We will order a Doppler ultrasound of your right lower extremity to check for any blood flow issues. We will also check your kidney function, potassium, and BNP levels to understand the cause of the swelling.  INSTRUCTIONS: 1. Start prednisone  20 mg daily for two weeks, then taper to 10 mg daily.  2. Continue using your Breztri  inhaler and Dupixent  injections.  3. Perform an Aviane test to assess airway inflammation.  4. Follow up with a new cardiologist for your coronary artery calcification.  5. Undergo annual imaging (CTA or MRA) to monitor your aortic aneurysm.  6. Complete your remaining two chemotherapy sessions for liver lesions.  7. Get a Doppler ultrasound of your right lower extremity.  8. Check kidney function, potassium, and BNP levels.  Follow-up 3 months with Dr. Annella or Landry NP /  Need to go to [facility] ([address] for labs today in the basement)

## 2024-01-02 NOTE — Telephone Encounter (Signed)
 Patient would like to know if he still needs his August appointment after he just had an appointment for today 01/02/24. Please call and advise.

## 2024-01-02 NOTE — Progress Notes (Signed)
 @Patient  ID: Luis [name], male    DOB: 1940-09-01, 83 y.o.   MRN: [id number]  No chief complaint on file.   Referring provider: [name], MD  HPI: 83 year old male, light smoker. PMH significant for HTN, CAD, carotid stenosis, COPD with asthma, sarcoidosis, pleural effusion, pulmonary fibrosis, adenocarcinoma cecum.   Previous [facility] pulmonary encounter:  11/06/2023 Discussed the use of AI scribe software for clinical note transcription with the patient, who gave verbal consent to proceed.  History of Present Illness   Luis [name] is an 83 year old male with COPD and sarcoidosis who presents with worsening respiratory symptoms.  He has been experiencing worsening respiratory symptoms over the past week, characterized by a productive cough with light green mucus. The cough began last Monday and drinking water sometimes helps to loosen the mucus, providing temporary relief for a couple of hours.  He has increased the use of his rescue inhaler and nebulizer to about four to five times a day, but this has not provided significant relief. He last used prednisone  in April, prior to his last follow-up visit. He is concerned about the possibility of sarcoidosis recurrence, as he experienced similar symptoms at the time of his initial diagnosis. After his diagnosis, he was on a long-term prednisone  regimen, which was tapered over several years.  His current medications for COPD and asthma include Breztri , Dupixent , albuterol  (both nebulizer and inhaler), and Singulair  (montelukast ). He has a history of smoking, which contributes to his COPD diagnosis.     1. Pulmonary sarcoidosis (HCC) (Primary) - CT Chest Wo Contrast; Future  2. Acute exacerbation of COPD with asthma (HCC)  Assessment and Plan    COPD with asthma COPD with asthma overlap, primarily managed with Breztri , Dupixent , albuterol , and montelukast . Recent exacerbation with increased wheezing and productive  cough, suggestive of asthma features. Increased use of rescue inhaler and nebulizer without significant relief. Informed about risks of long-term prednisone  use, including immunosuppression, drug-induced diabetes, and osteoporosis. Decision made to administer a Depo steroid shot and start a prednisone  taper to manage symptoms. - Administer Depo-medrol  80mg  - Prescribe prednisone  taper starting tomorrow. - Instruct to use nebulizer 2-3 times daily. - Continue Breztri , Dupixent , albuterol , and montelukast .  Bacterial bronchitis Acute bacterial bronchitis suspected due to productive cough with light green sputum and increased mucus production.  - Prescribe Z-Pak (azithromycin ) for suspected bacterial bronchitis. - Continue Robitussin DM every 4-6 hours for cough management.  Sarcoidosis Sarcoidosis with previous lung involvement. CT chest in March showed left lower lobe consolidation measuring 2.9 x 1.6cm, favored to refect atelectasis  follow-up in 3 months recommended  - Order CT scan to evaluate left lower lobe area for potential sarcoidosis recurrence vs malignancy   Spinal stenosis Spinal stenosis managed with pain medications. Currently not taking pain medications for the past month. Advised to avoid NSAIDs while on prednisone  due to potential interactions. - Advise to avoid ibuprofen and naproxen  while on prednisone .  Colon cancer - Diagnosed in February 2019 with recurrent/metastatic disease in September 2020.  Following with Dr. Brenna with [facility], last seen in April 2025.    01/02/2024- Interim hx  Discussed the use of AI scribe software for clinical note transcription with the patient, who gave verbal consent to proceed.  History of Present Illness   Luis [name] is an 83 year old male with sarcoidosis and COPD-asthma overlap who presents with respiratory issues and medication management.  He experienced an asthma flare-up  in May, which was treated with a Depo  Medrol  steroid shot, a prednisone  taper, and a Z-Pak. Currently, he has difficulty walking short distances, approximately 100 feet, without becoming breathless and describes an inability to take deep breaths when out of breath. He also reports intermittent wheezing, particularly at night, requiring him to sit up in a chair to sleep. Coughing fits occur every two to three days, often producing green sputum, which improves after expectoration.  He has a history of long-term prednisone  use for sarcoidosis, which he discontinued around 2001-2002. During that period, he managed his symptoms effectively with a tapering dose of prednisone  and wants to explore a similar regimen to manage his current symptoms. He is currently on Breztri  inhaler and Dupixent  injections for his respiratory conditions, but notes that Dupixent  has not made a significant difference in his symptoms.  He reports leg swelling, particularly in the right leg, which has been more swollen than the left. He has been taking Lasix , increasing the dose to 60 mg daily for about a week, with minimal improvement. He has a history of pleural effusion and prn thoracentesis.  He is undergoing chemotherapy for liver lesions related to colon cancer, with treatments every two weeks and has two more sessions remaining. He is under the care of Dr. Brenna at [facility] and last saw him two weeks ago.      No Known Allergies  Immunization History  Administered Date(s) Administered   DTaP / IPV 09/25/2012   Fluad Quad(high Dose 65+) 02/19/2019, 03/14/2022   Influenza Split 03/27/2014   Influenza, High Dose Seasonal PF 03/27/2014, 01/27/2017, 02/22/2018, 03/12/2021   Influenza-Unspecified 02/24/2017, 03/10/2020   PFIZER Comirnaty(Gray Top)Covid-19 Tri-Sucrose Vaccine 07/21/2019   PFIZER(Purple Top)SARS-COV-2 Vaccination 07/11/2019, 07/21/2019, 01/26/2020, 10/26/2020   Pneumococcal Conjugate-13 03/27/2014   Pneumococcal Polysaccharide-23  11/10/2009   Pneumococcal-Unspecified 03/12/2014   Tdap 03/24/2011, 09/25/2012, 05/28/2021   Zoster Recombinant(Shingrix) 09/01/2018, 02/19/2019   Zoster, Live 03/21/2010    Past Medical History:  Diagnosis Date   Adenocarcinoma of cecum (HCC) 06/21/2017   Arthritis    mid back; hands; knees (02/24/2015)   Asthmatic bronchitis with acute exacerbation 06/27/2021   Basal cell carcinoma    left shoulder; mid chest; right eyelid (02/24/2015)   CAD (coronary artery disease)    a. 08/2014 Cath/PCI: LM nl, LAD 30p, D1 95 (2.25x12 Resolute Integrity DES), LCX small, RI 100 (attempted PCI) - branches fill via L->L collats, RCA dominant, nl, RPDA/PLA nl, EF 60^. b. 02/24/2015 PCI CTO of Ramus DES x2.   Chronic bronchitis (HCC)    hx   Diverticulosis 05/2005   Elevated lipase    Fuchs' corneal dystrophy    History of adenomatous polyp of colon 05/2005   8 mm adenoma   History of blood transfusion    related to some of my surgeries   Hyperlipidemia    Hypertension    Iron deficiency anemia due to chronic blood loss 06/21/2017   Pneumonia    Prostate cancer (HCC) 2011   S/P seed implant   Sarcoidosis    Thrombocytopenia (HCC)    a. Noted on prior labs, unclear of what w/u done.   Trifascicular block     Tobacco History: Social History   Tobacco Use  Smoking Status Light Smoker   Types: Cigars  Smokeless Tobacco Never  Tobacco Comments   02/24/2015 quit cigarettes ~ 2000 ago but smokes cigars about once every two months. Updated 01/16/2023 am   Ready to quit: Not Answered Counseling given: Not  Answered Tobacco comments: 02/24/2015 quit cigarettes ~ 2000 ago but smokes cigars about once every two months. Updated 01/16/2023 am   Outpatient Medications Prior to Visit  Medication Sig Dispense Refill   acetaminophen  (TYLENOL ) 500 MG tablet Take 500-1,000 mg by mouth every 6 (six) hours as needed for moderate pain.     albuterol  (PROVENTIL ) (2.5 MG/3ML) 0.083% nebulizer solution  INHALE 3 ML BY NEBULIZATION EVERY 6 HOURS AS NEEDED FOR WHEEZING OR SHORTNESS OF BREATH 75 mL 5   albuterol  (VENTOLIN  HFA) 108 (90 Base) MCG/ACT inhaler INHALE 1-2 PUFFS BY MOUTH EVERY 6 HOURS AS NEEDED FOR WHEEZE OR SHORTNESS OF BREATH 18 each 3   azithromycin  (ZITHROMAX  Z-PAK) 250 MG tablet Per Zpack instructions 6 tablet 0   BREZTRI  AEROSPHERE 160-9-4.8 MCG/ACT AERO inhaler INHALE 2 PUFFS INTO THE LUNGS IN THE MORNING AND AT BEDTIME. RINSE MOUTH AFTER USE. 10.7 each 5   cetirizine  (ZYRTEC ) 10 MG tablet Take 1 tablet (10 mg total) by mouth daily. 90 tablet 11   Cholecalciferol  (VITAMIN D3) 2000 units capsule Take 2,000 Units by mouth daily.     clopidogrel  (PLAVIX ) 75 MG tablet Take 1 tablet (75 mg total) by mouth daily. 90 tablet 2   Dupilumab  (DUPIXENT ) 300 MG/2ML SOAJ Inject 300 mg into the skin every 14 (fourteen) days. **loading dose received in clinic on 10/10/23** 4 mL 5   ezetimibe  (ZETIA ) 10 MG tablet TAKE 1 TABLET BY MOUTH EVERY DAY 90 tablet 3   fluticasone  (FLONASE ) 50 MCG/ACT nasal spray Place 2 sprays into both nostrils daily. 48 mL 3   furosemide  (LASIX ) 20 MG tablet Take 1 tablet (20 mg total) by mouth daily as needed for fluid or edema. 30 tablet 6   gabapentin  (NEURONTIN ) 100 MG capsule Take 100 mg by mouth daily as needed (for pain).     ibuprofen (ADVIL) 200 MG tablet Take 200-400 mg by mouth every 6 (six) hours as needed for moderate pain or mild pain.     levothyroxine  (SYNTHROID ) 50 MCG tablet Take 50 mcg by mouth daily.     MAGNESIUM  PO Take 1 tablet by mouth daily.     metoprolol  succinate (TOPROL -XL) 50 MG 24 hr tablet TAKE 1 TABLET (50 MG TOTAL) BY MOUTH EVERY EVENING. TAKE WITH OR IMMEDIATELY FOLLOWING A MEAL. 90 tablet 3   montelukast  (SINGULAIR ) 10 MG tablet Take 1 tablet (10 mg total) by mouth at bedtime. 30 tablet 11   Multiple Vitamins-Minerals (VITRUM SENIOR) TABS Take 1 tablet by mouth daily.     mupirocin ointment (BACTROBAN) 2 % Apply 1 Application topically 2  (two) times daily.     naproxen  sodium (ALEVE ) 220 MG tablet Take 440 mg by mouth 2 (two) times daily as needed (pain).     nitroGLYCERIN  (NITROSTAT ) 0.4 MG SL tablet PLACE 1 TABLET UNDER THE TONGUE EVERY 5 MINUTES AS NEEDED FOR CHEST PAIN FOR 3 DOSES 25 tablet 11   predniSONE  (DELTASONE ) 10 MG tablet 4 tabs for 3 days, then 3 tabs for 3 days, 2 tabs for 3 days, then 1 tab for 3 days, then stop 30 tablet 0   ranolazine  (RANEXA ) 500 MG 12 hr tablet TAKE 1 TABLET BY MOUTH TWICE A DAY 180 tablet 3   rosuvastatin  (CRESTOR ) 20 MG tablet Take 1 tablet (20 mg total) by mouth daily. 90 tablet 2   traMADol  (ULTRAM ) 50 MG tablet Take 2 tablets (100 mg total) by mouth every 6 (six) hours as needed. (Patient taking differently: Take 100 mg  by mouth every 6 (six) hours as needed for moderate pain (pain score 4-6).) 10 tablet 0   No facility-administered medications prior to visit.   Review of Systems  Review of Systems  Constitutional: Negative.   HENT: Negative.    Respiratory:  Positive for cough, shortness of breath and wheezing.   Cardiovascular:  Positive for leg swelling.     Physical Exam  There were no vitals taken for this visit. Physical Exam Constitutional:      Appearance: Normal appearance. He is obese.  HENT:     Head: Normocephalic and atraumatic.  Cardiovascular:     Rate and Rhythm: Normal rate and regular rhythm.  Pulmonary:     Effort: Pulmonary effort is normal.     Breath sounds: Normal breath sounds. No wheezing or rhonchi.  Musculoskeletal:        General: Normal range of motion.     Right lower leg: Edema present.  Skin:    General: Skin is warm and dry.  Neurological:     General: No focal deficit present.     Mental Status: He is alert and oriented to person, place, and time. Mental status is at baseline.  Psychiatric:        Mood and Affect: Mood normal.        Behavior: Behavior normal.        Thought Content: Thought content normal.        Judgment:  Judgment normal.      Lab Results:  CBC    Component Value Date/Time   WBC 11.9 (H) 08/26/2023 0500   RBC 3.61 (L) 08/26/2023 0500   HGB 11.8 (L) 08/26/2023 0500   HGB 13.4 02/16/2023 0837   HCT 35.3 (L) 08/26/2023 0500   HCT 39.9 02/16/2023 0837   PLT 173 08/26/2023 0500   PLT 178 02/16/2023 0837   MCV 97.8 08/26/2023 0500   MCV 91 02/16/2023 0837   MCH 32.7 08/26/2023 0500   MCHC 33.4 08/26/2023 0500   RDW 16.0 (H) 08/26/2023 0500   RDW 16.1 (H) 02/16/2023 0837   LYMPHSABS 0.1 (L) 08/25/2023 1227   MONOABS 0.6 08/25/2023 1227   EOSABS 0.0 08/25/2023 1227   BASOSABS 0.0 08/25/2023 1227    BMET    Component Value Date/Time   NA 132 (L) 08/26/2023 0500   NA 136 02/16/2023 0837   K 4.5 08/26/2023 0500   CL 99 08/26/2023 0500   CO2 23 08/26/2023 0500   GLUCOSE 243 (H) 08/26/2023 0500   BUN 32 (H) 08/26/2023 0500   BUN 12 02/16/2023 0837   CREATININE 1.28 (H) 08/26/2023 0500   CREATININE 0.83 06/07/2020 1119   CREATININE 1.02 05/16/2016 0937   CALCIUM  8.7 (L) 08/26/2023 0500   GFRNONAA 56 (L) 08/26/2023 0500   GFRNONAA >60 06/07/2020 1119   GFRAA >60 03/10/2020 0855    BNP    Component Value Date/Time   BNP 179.7 (H) 08/24/2023 1818   BNP 43.4 05/16/2016 0937    ProBNP    Component Value Date/Time   PROBNP 69 08/01/2021 0857   PROBNP 60.0 08/10/2014 1052    Imaging: No results found.   Assessment & Plan:   1. Shortness of breath (Primary) - Brain natriuretic peptide; Future - Basic metabolic panel with GFR; Future  2. Severe persistent asthma without complication - Brain natriuretic peptide; Future - Basic metabolic panel with GFR; Future - POCT EXHALED NITRIC OXIDE   3. Swelling of right lower extremity - VAS US  LOWER EXTREMITY  VENOUS (DVT); Future   Assessment and Plan    COPD-Asthma overlap with recurrent exacerbations  Recent exacerbation in May treated with Depo Medrol , prednisone  taper, and Z-Pak. Currently experiencing dyspnea  after walking 100 feet, nocturnal wheezing, and intermittent productive cough with green sputum. Limited improvement with Dupixent . Long-term prednisone  use has provided good symptom control previously. Risks of chronic prednisone  use, including osteoporosis and drug-induced diabetes, were discussed and accepted for symptom control. - Start prednisone  20 mg daily for two weeks, then taper to 10 mg daily. - Perform FENO test to assess airway inflammation. - Continue Breztri  inhaler two puffs twice daily - Continue Dupixent  injections q 14 days  Sarcoidosis Chronic changes related to sarcoidosis are well-managed. No new pulmonary infiltrates. Previous pneumonia in the left lower lobe has resolved.  Small right pleural effusion Small right pleural effusion noted on CT scan, stable and residual. No new symptoms suggestive of worsening effusion.  - Thoracentesis as needed    Coronary artery calcification Coronary artery calcifications present in several vessels. He is on Crestor  20 mg.  -Follow up with cardiologist   Aortic aneurysm Aortic aneurysm measured at 4.2 cm, stable since December 2022.  -Annual imaging required to monitor the aneurysm.  Liver lesions under treatment for colon cancer Under care of Dr. Brenna at [facility], undergoing chemotherapy every two weeks with two sessions remaining.  Leg swelling Chronic leg swelling, more pronounced in the right leg. Limited effect from Lasix  increased to 60 mg daily. No history of knee, hip, or vascular surgery on the leg. No redness or heat noted. Previous small pleural effusion was stable on CT scan. - Order Doppler ultrasound of the right lower extremity. - Check kidney function, potassium, and BNP levels.  Recording duration: 21 minutes     Almarie LELON Ferrari, NP 01/02/2024

## 2024-01-03 ENCOUNTER — Telehealth: Payer: Self-pay

## 2024-01-03 ENCOUNTER — Ambulatory Visit: Payer: Self-pay | Admitting: Primary Care

## 2024-01-03 DIAGNOSIS — C189 Malignant neoplasm of colon, unspecified: Secondary | ICD-10-CM | POA: Diagnosis not present

## 2024-01-03 DIAGNOSIS — C7800 Secondary malignant neoplasm of unspecified lung: Secondary | ICD-10-CM | POA: Diagnosis not present

## 2024-01-03 DIAGNOSIS — J449 Chronic obstructive pulmonary disease, unspecified: Secondary | ICD-10-CM | POA: Diagnosis not present

## 2024-01-03 DIAGNOSIS — C787 Secondary malignant neoplasm of liver and intrahepatic bile duct: Secondary | ICD-10-CM | POA: Diagnosis not present

## 2024-01-03 DIAGNOSIS — K219 Gastro-esophageal reflux disease without esophagitis: Secondary | ICD-10-CM | POA: Diagnosis not present

## 2024-01-03 DIAGNOSIS — Z5111 Encounter for antineoplastic chemotherapy: Secondary | ICD-10-CM | POA: Diagnosis not present

## 2024-01-03 DIAGNOSIS — J90 Pleural effusion, not elsewhere classified: Secondary | ICD-10-CM | POA: Diagnosis not present

## 2024-01-03 DIAGNOSIS — Z5112 Encounter for antineoplastic immunotherapy: Secondary | ICD-10-CM | POA: Diagnosis not present

## 2024-01-03 DIAGNOSIS — C180 Malignant neoplasm of cecum: Secondary | ICD-10-CM | POA: Diagnosis not present

## 2024-01-03 DIAGNOSIS — E039 Hypothyroidism, unspecified: Secondary | ICD-10-CM | POA: Diagnosis not present

## 2024-01-03 DIAGNOSIS — D696 Thrombocytopenia, unspecified: Secondary | ICD-10-CM | POA: Diagnosis not present

## 2024-01-03 NOTE — Telephone Encounter (Signed)
 No, but he needs to keep the October appt.

## 2024-01-03 NOTE — Telephone Encounter (Unsigned)
 Copied from CRM [id number]. Topic: Clinical - Prescription Issue >> Jan 01, 2024  4:50 PM Rozanna G wrote: Reason for CRM: PT CALLING STATED HE TRIED TO GET A REFILL ON HIS albuterol  (PROVENTIL ) (2.5 MG/3ML) 0.083% nebulizer solution BUT THE PHARMACY TOLD HIM IT WAS TO SOON. THE DX IN ON THE PRESCRIPTION AND WHAT THE PROVIDER TOLD HIM THERE IS A CONFLICT. PT SEES PROVIDER TOMORROW CALLING NT FOR ADVISE.

## 2024-01-03 NOTE — Telephone Encounter (Signed)
 Copied from CRM #[id number]. Topic: Clinical - Prescription Issue >> Jan 01, 2024  5:09 PM Rilla B wrote: Patient states there is an issue with the way the script is written. Patient states script is written for every 6 hours as needed which causes him to run out of medication because he was told to take it three times a day. Patient thinks he needs a new prescription and he will discuss with Ms Hope at his appt tomorrow (7/23) at 1:30p.  ------------------------------------------------------------------- From previous Reason for Contact - Other: Reason for CRM: Pat     Patient was seen in office on 7/23

## 2024-01-03 NOTE — Telephone Encounter (Signed)
 Called patient, no one answered so I left a voicemail.

## 2024-01-04 ENCOUNTER — Telehealth: Payer: Self-pay | Admitting: Primary Care

## 2024-01-04 ENCOUNTER — Ambulatory Visit: Admitting: Pharmacist

## 2024-01-04 ENCOUNTER — Other Ambulatory Visit (HOSPITAL_COMMUNITY): Payer: Self-pay

## 2024-01-04 ENCOUNTER — Telehealth: Payer: Self-pay | Admitting: *Deleted

## 2024-01-04 ENCOUNTER — Telehealth: Payer: Self-pay

## 2024-01-04 ENCOUNTER — Ambulatory Visit (HOSPITAL_COMMUNITY)
Admission: RE | Admit: 2024-01-04 | Discharge: 2024-01-04 | Disposition: A | Discharge status: Home / Self Care | Source: Ambulatory Visit | Attending: Primary Care | Admitting: Primary Care

## 2024-01-04 VITALS — BP 147/63 | HR 53 | Wt 226.0 lb

## 2024-01-04 DIAGNOSIS — I2510 Atherosclerotic heart disease of native coronary artery without angina pectoris: Secondary | ICD-10-CM

## 2024-01-04 DIAGNOSIS — I82431 Acute embolism and thrombosis of right popliteal vein: Secondary | ICD-10-CM | POA: Insufficient documentation

## 2024-01-04 DIAGNOSIS — I824Y9 Acute embolism and thrombosis of unspecified deep veins of unspecified proximal lower extremity: Secondary | ICD-10-CM

## 2024-01-04 DIAGNOSIS — M7989 Other specified soft tissue disorders: Secondary | ICD-10-CM | POA: Insufficient documentation

## 2024-01-04 MED ORDER — ENOXAPARIN SODIUM 100 MG/ML IJ SOSY
100.0000 mg | PREFILLED_SYRINGE | Freq: Two times a day (BID) | INTRAMUSCULAR | 0 refills | Status: DC
  Filled 2024-01-04 (×2): qty 30, 15d supply, fill #0

## 2024-01-04 NOTE — Progress Notes (Signed)
 DVT Clinic Note  Name: Luis [name]     MRN: [id number]     DOB: 14-Mar-1941     Sex: male  PCP: [name], MD  Today's Visit: Visit Information: Initial Visit  Referred to DVT Clinic by: Pulmonology - Almarie Ferrari, NP Referred to CPP by: Dr. Pearline Reason for referral:  Chief Complaint  Patient presents with   DVT   HISTORY OF PRESENT ILLNESS: Luis [name] is a 83 y.o. male with PMH metastatic colon cancer, CAD, HFrEF, COPD, carotid stenosis who presents after diagnosis of DVT for medication management. Patient reports he noticed increased swelling and pain a couple weeks ago. He has had LEE in the past related to his HF so he initially thought this was the cause and increased the dose of his Lasix . However, his swelling did not improve and he began to have pain and redness. He was seen by pulmonology on 01/02/24 who ordered an ultrasound which showed age indeterminate DVT involving the right popliteal vein, and right posterior tibial veins and he was referred to DVT Clinic to start treatment. He denies SOB, chest pain, and s/sx of bleeding. He has a history of internal hemorrhoids which sometimes cause blood in his stool, but this is not bothering him currently. Patient does note that he bruises very easily. Reports his right leg swelling is improving with compression and elevation and pain has nearly resolved. He is completing his normal daily activities, enjoys woodworking and being out in the yard. Of note, he takes Aleve  or Advil as needed for back pain, usually takes one of these at least once a week.   Positive Thrombotic Risk Factors: Active cancer, Older Age, Obesity Bleeding Risk Factors: Age >65 years, Cancer, Antiplatelet therapy, Anticoagulant therapy, Anemia  Negative Thrombotic Risk Factors: Previous VTE, Recent surgery (within 3 months), Recent trauma (within 3 months), Recent admission to hospital with acute illness (within 3 months), Paralysis, paresis, or  recent plaster cast immobilization of lower extremity, Central venous catheterization, Bed rest >72 hours within 3 months, Sedentary journey lasting >8 hours within 4 weeks, Pregnancy, Within 6 weeks postpartum, Recent cesarean section (within 3 months), Estrogen therapy, Testosterone therapy, Erythropoiesis-stimulating agent, Recent COVID diagnosis (within 3 months), Known thrombophilic condition, Smoking, Non-malignant, chronic inflammatory condition  Rx Insurance Coverage: Medicare Rx Affordability: Lovenox  is $0 for 30 day supply as he has met his out of pocket maximum Rx Assistance Provided: N/A Preferred Pharmacy: Lovenox  filled at [facility]  Past Medical History:  Diagnosis Date   Adenocarcinoma of cecum (HCC) 06/21/2017   Arthritis    mid back; hands; knees (02/24/2015)   Asthmatic bronchitis with acute exacerbation 06/27/2021   Basal cell carcinoma    left shoulder; mid chest; right eyelid (02/24/2015)   CAD (coronary artery disease)    a. 08/2014 Cath/PCI: LM nl, LAD 30p, D1 95 (2.25x12 Resolute Integrity DES), LCX small, RI 100 (attempted PCI) - branches fill via L->L collats, RCA dominant, nl, RPDA/PLA nl, EF 60^. b. 02/24/2015 PCI CTO of Ramus DES x2.   Chronic bronchitis (HCC)    hx   Diverticulosis 05/2005   Elevated lipase    Fuchs' corneal dystrophy    History of adenomatous polyp of colon 05/2005   8 mm adenoma   History of blood transfusion    related to some of my surgeries   Hyperlipidemia    Hypertension    Iron deficiency anemia due to chronic blood loss 06/21/2017  Pneumonia    Prostate cancer (HCC) 2011   S/P seed implant   Sarcoidosis    Thrombocytopenia (HCC)    a. Noted on prior labs, unclear of what w/u done.   Trifascicular block     Past Surgical History:  Procedure Laterality Date   BASAL CELL CARCINOMA EXCISION Left    shoulder   CARDIAC CATHETERIZATION N/A 02/24/2015   Procedure: Coronary/Bypass Graft CTO  Intervention;  Surgeon: Candyce GORMAN Reek, MD;  Location: [facility] INVASIVE CV LAB;  Service: Cardiovascular;  Laterality: N/A;   CARDIAC CATHETERIZATION  02/24/2015   Procedure: Coronary/Graft Atherectomy;  Surgeon: Candyce GORMAN Reek, MD;  Location: [facility] INVASIVE CV LAB;  Service: Cardiovascular;;   CATARACT EXTRACTION W/ INTRAOCULAR LENS  IMPLANT, BILATERAL Bilateral ~ 2011-2012   COLONOSCOPY W/ POLYPECTOMY  06/08/2005 and 10/18/2010   8 mm adenoma 2006, none 2012. Diverticulosis and internal hemorrhoids.   CORNEAL TRANSPLANT Bilateral ~ 2011-2012   @ same time as cataract OR   CORONARY ANGIOPLASTY WITH STENT PLACEMENT  08/2014; 02/24/2015   1 stent + 1 stent   CORONARY STENT INTERVENTION N/A 01/02/2019   Procedure: CORONARY STENT INTERVENTION;  Surgeon: Reek Candyce GORMAN, MD;  Location: [facility] INVASIVE CV LAB;  Service: Cardiovascular;  Laterality: N/A;   EYE SURGERY     FINGER SURGERY Right 2014   reattached middle finger   INSERTION PROSTATE RADIATION SEED  ~ 2011   IR IMAGING GUIDED PORT INSERTION  03/04/2019   IR RADIOLOGIST EVAL & MGMT  05/26/2020   IR US  GUIDE BX ASP/DRAIN  03/04/2019   LAPAROSCOPIC CHOLECYSTECTOMY  2015   LAPAROSCOPIC PARTIAL COLECTOMY N/A 07/26/2017   Procedure: LAPAROSCOPIC PARTIAL COLECTOMY, EXCISION SCROTAL CYST;  Surgeon: Tanda Locus, MD;  Location: [facility] ORS;  Service: General;  Laterality: N/A;   LEFT HEART CATH AND CORONARY ANGIOGRAPHY N/A 01/02/2019   Procedure: LEFT HEART CATH AND CORONARY ANGIOGRAPHY;  Surgeon: Reek Candyce GORMAN, MD;  Location: [facility] INVASIVE CV LAB;  Service: Cardiovascular;  Laterality: N/A;   LEFT HEART CATHETERIZATION WITH CORONARY ANGIOGRAM N/A 08/12/2014   Procedure: LEFT HEART CATHETERIZATION WITH CORONARY ANGIOGRAM;  Surgeon: Ozell JONETTA Fell, MD;  Location: [facility] CATH LAB;  Service: Cardiovascular;  Laterality: N/A;   LYMPH NODE BIOPSY     mid chest   MOHS SURGERY Right ~ 2011   eyelid; for basal cell   RADIOLOGY WITH ANESTHESIA N/A  06/30/2020   Procedure: IR WITH ANESTHESIA MICROWAVE ABLATION;  Surgeon: Alona Corners, DO;  Location: [facility] ORS;  Service: Anesthesiology;  Laterality: N/A;   RIGHT/LEFT HEART CATH AND CORONARY ANGIOGRAPHY N/A 04/25/2022   Procedure: RIGHT/LEFT HEART CATH AND CORONARY ANGIOGRAPHY;  Surgeon: Reek Candyce GORMAN, MD;  Location: [facility] INVASIVE CV LAB;  Service: Cardiovascular;  Laterality: N/A;   THORACENTESIS N/A 02/11/2021   Procedure: MILANA;  Surgeon: Annella Donnice SAUNDERS, MD;  Location: [facility] ENDOSCOPY;  Service: Pulmonary;  Laterality: N/A;   THORACENTESIS Right 04/04/2021   Procedure: THORACENTESIS;  Surgeon: Claudene Toribio BROCKS, MD;  Location: [facility] ENDOSCOPY;  Service: Pulmonary;  Laterality: Right;   THORACENTESIS Right 08/16/2021   Procedure: THORACENTESIS;  Surgeon: Harold Scholz, MD;  Location: [facility] ENDOSCOPY;  Service: Pulmonary;  Laterality: Right;   THORACENTESIS Right 11/29/2021   Procedure: THORACENTESIS;  Surgeon: Kara Dorn NOVAK, MD;  Location: [facility] ENDOSCOPY;  Service: Pulmonary;  Laterality: Right;   THORACENTESIS N/A 03/17/2022   Procedure: MILANA;  Surgeon: Annella Donnice SAUNDERS, MD;  Location: [facility] ENDOSCOPY;  Service: Pulmonary;  Laterality: N/A;   THORACENTESIS N/A 07/14/2022  Procedure: THORACENTESIS;  Surgeon: Brenna Adine CROME, DO;  Location: [facility] ENDOSCOPY;  Service: Pulmonary;  Laterality: N/A;   THORACENTESIS N/A 10/11/2022   Procedure: MILANA;  Surgeon: Harold Scholz, MD;  Location: [facility] ENDOSCOPY;  Service: Pulmonary;  Laterality: N/A;   THOROCOTOMY WITH LOBECTOMY Right ~ 1991   partial removal right lung   TONSILLECTOMY  ~ 1950    Social History   Socioeconomic History   Marital status: Married    Spouse name: Not on file   Number of children: 2   Years of education: Not on file   Highest education level: Not on file  Occupational History   Occupation: Unemployed  Tobacco Use   Smoking status: Former    Types: Cigars   Smokeless tobacco: Never   Tobacco  comments:    Has stopped smoking and is no longer smoking cigars as of 2024.   Vaping Use   Vaping status: Never Used  Substance and Sexual Activity   Alcohol use: Yes    Alcohol/week: 2.0 standard drinks of alcohol    Types: 1 Glasses of wine, 1 Standard drinks or equivalent per week    Comment: Drinks maybe 1-2 drinks a week or less   Drug use: No   Sexual activity: Not on file  Other Topics Concern   Not on file  Social History Narrative   Lives at home wife.     Social Drivers of Corporate investment banker Strain: Not on file  Food Insecurity: No Food Insecurity (08/25/2023)   Hunger Vital Sign    Worried About Running Out of Food in the Last Year: Never true    Ran Out of Food in the Last Year: Never true  Transportation Needs: No Transportation Needs (08/25/2023)   PRAPARE - Administrator, Civil Service (Medical): No    Lack of Transportation (Non-Medical): No  Physical Activity: Not on file  Stress: Not on file  Social Connections: Socially Integrated (08/25/2023)   Social Connection and Isolation Panel    Frequency of Communication with Friends and Family: More than three times a week    Frequency of Social Gatherings with Friends and Family: Once a week    Attends Religious Services: More than 4 times per year    Active Member of [employer] or Organizations: Yes    Attends Engineer, structural: More than 4 times per year    Marital Status: Married  Catering manager Violence: Not At Risk (08/25/2023)   Humiliation, Afraid, Rape, and Kick questionnaire    Fear of Current or Ex-Partner: No    Emotionally Abused: No    Physically Abused: No    Sexually Abused: No    Family History  Problem Relation Age of Onset   Heart disease Father 40       Died from hardening of the arteries age 55   Coronary artery disease Sister 26   Heart attack Sister    Colon cancer Neg Hx    Throat cancer Neg Hx    Pancreatic cancer Neg Hx    Prostate cancer Neg Hx     Stroke Neg Hx    Hypertension Neg Hx     Allergies as of 01/04/2024   (No Known Allergies)    Current Outpatient Medications on File Prior to Visit  Medication Sig Dispense Refill   albuterol  (VENTOLIN  HFA) 108 (90 Base) MCG/ACT inhaler INHALE 1-2 PUFFS BY MOUTH EVERY 6 HOURS AS NEEDED FOR WHEEZE OR SHORTNESS OF BREATH  18 each 3   BREZTRI  AEROSPHERE 160-9-4.8 MCG/ACT AERO inhaler INHALE 2 PUFFS INTO THE LUNGS IN THE MORNING AND AT BEDTIME. RINSE MOUTH AFTER USE. 10.7 each 5   cetirizine  (ZYRTEC ) 10 MG tablet Take 1 tablet (10 mg total) by mouth daily. 90 tablet 11   Cholecalciferol  (VITAMIN D3) 2000 units capsule Take 2,000 Units by mouth daily.     clopidogrel  (PLAVIX ) 75 MG tablet Take 1 tablet (75 mg total) by mouth daily. 90 tablet 2   Dupilumab  (DUPIXENT ) 300 MG/2ML SOAJ Inject 300 mg into the skin every 14 (fourteen) days. **loading dose received in clinic on 10/10/23** 4 mL 5   ezetimibe  (ZETIA ) 10 MG tablet TAKE 1 TABLET BY MOUTH EVERY DAY 90 tablet 3   fluticasone  (FLONASE ) 50 MCG/ACT nasal spray Place 2 sprays into both nostrils daily. 48 mL 3   furosemide  (LASIX ) 20 MG tablet Take 1 tablet (20 mg total) by mouth daily as needed for fluid or edema. 30 tablet 6   gabapentin  (NEURONTIN ) 100 MG capsule Take 100 mg by mouth daily as needed (for pain).     ibuprofen (ADVIL) 200 MG tablet Take 200-400 mg by mouth every 6 (six) hours as needed for moderate pain or mild pain.     levothyroxine  (SYNTHROID ) 50 MCG tablet Take 50 mcg by mouth daily.     MAGNESIUM  PO Take 1 tablet by mouth daily.     metoprolol  succinate (TOPROL -XL) 50 MG 24 hr tablet TAKE 1 TABLET (50 MG TOTAL) BY MOUTH EVERY EVENING. TAKE WITH OR IMMEDIATELY FOLLOWING A MEAL. 90 tablet 3   metroNIDAZOLE (METROCREAM) 0.75 % cream Apply 1 Application topically 2 (two) times daily.     minocycline (MINOCIN) 100 MG capsule Take 100 mg by mouth daily.     montelukast  (SINGULAIR ) 10 MG tablet Take 1 tablet (10 mg total) by mouth at  bedtime. 30 tablet 11   Multiple Vitamins-Minerals (VITRUM SENIOR) TABS Take 1 tablet by mouth daily.     naproxen  sodium (ALEVE ) 220 MG tablet Take 440 mg by mouth 2 (two) times daily as needed (pain).     predniSONE  (DELTASONE ) 10 MG tablet Take 2 tablets daily x 2 weeks; then 1 tablet daily 45 tablet 0   ranolazine  (RANEXA ) 500 MG 12 hr tablet TAKE 1 TABLET BY MOUTH TWICE A DAY 180 tablet 3   rosuvastatin  (CRESTOR ) 20 MG tablet Take 1 tablet (20 mg total) by mouth daily. 90 tablet 2   traMADol  (ULTRAM ) 50 MG tablet Take 2 tablets (100 mg total) by mouth every 6 (six) hours as needed. 10 tablet 0   acetaminophen  (TYLENOL ) 500 MG tablet Take 500-1,000 mg by mouth every 6 (six) hours as needed for moderate pain. (Patient not taking: Reported on 01/04/2024)     albuterol  (PROVENTIL ) (2.5 MG/3ML) 0.083% nebulizer solution INHALE 3 ML BY NEBULIZATION EVERY 6 HOURS AS NEEDED FOR WHEEZING OR SHORTNESS OF BREATH (Patient not taking: Reported on 01/04/2024) 75 mL 5   mupirocin ointment (BACTROBAN) 2 % Apply 1 Application topically 2 (two) times daily. (Patient not taking: Reported on 01/04/2024)     nitroGLYCERIN  (NITROSTAT ) 0.4 MG SL tablet PLACE 1 TABLET UNDER THE TONGUE EVERY 5 MINUTES AS NEEDED FOR CHEST PAIN FOR 3 DOSES 25 tablet 11   No current facility-administered medications on file prior to visit.   REVIEW OF SYSTEMS:  Review of Systems  Respiratory:  Negative for shortness of breath.   Cardiovascular:  Positive for leg swelling (right). Negative for  chest pain.  Musculoskeletal:  Negative for myalgias.  Neurological:  Negative for tingling.   PHYSICAL EXAMINATION:  Vitals:   01/04/24 1054  BP: (!) 147/63  Pulse: (!) 53  SpO2: 96%  Weight: 226 lb (102.5 kg)    Body mass index is 31.97 kg/m.  Physical Exam Cardiovascular:     Rate and Rhythm: Normal rate.  Pulmonary:     Effort: Pulmonary effort is normal.  Musculoskeletal:        General: No tenderness.     Right lower leg:  Edema (2+ pitting edema) present.  Neurological:     Mental Status: He is alert.  Psychiatric:        Mood and Affect: Mood normal.    Villalta Score for Post-Thrombotic Syndrome: Pain: Absent Cramps: Absent Heaviness: Absent Paresthesia: Absent Pruritus: Absent Pretibial Edema: Moderate Skin Induration: Absent Hyperpigmentation: Absent Redness: Mild Venous Ectasia: Absent Pain on calf compression: Absent Villalta Preliminary Score: 3 Is venous ulcer present?: No If venous ulcer is present and score is <15, then 15 points total are assigned: Absent Villalta Total Score: 3  LABS:  CBC     Component Value Date/Time   WBC 11.9 (H) 08/26/2023 0500   RBC 3.61 (L) 08/26/2023 0500   HGB 11.8 (L) 08/26/2023 0500   HGB 13.4 02/16/2023 0837   HCT 35.3 (L) 08/26/2023 0500   HCT 39.9 02/16/2023 0837   PLT 173 08/26/2023 0500   PLT 178 02/16/2023 0837   MCV 97.8 08/26/2023 0500   MCV 91 02/16/2023 0837   MCH 32.7 08/26/2023 0500   MCHC 33.4 08/26/2023 0500   RDW 16.0 (H) 08/26/2023 0500   RDW 16.1 (H) 02/16/2023 0837   LYMPHSABS 0.1 (L) 08/25/2023 1227   MONOABS 0.6 08/25/2023 1227   EOSABS 0.0 08/25/2023 1227   BASOSABS 0.0 08/25/2023 1227    Hepatic Function      Component Value Date/Time   PROT 6.4 (L) 08/25/2023 1227   PROT 5.9 (L) 02/16/2023 0837   ALBUMIN 3.0 (L) 08/25/2023 1227   ALBUMIN 3.7 02/16/2023 0837   AST 47 (H) 08/25/2023 1227   AST 43 (H) 06/07/2020 1119   ALT 32 08/25/2023 1227   ALT 38 06/07/2020 1119   ALKPHOS 235 (H) 08/25/2023 1227   BILITOT 1.0 08/25/2023 1227   BILITOT 0.5 02/16/2023 0837   BILITOT 0.5 06/07/2020 1119   BILIDIR 0.27 09/15/2022 0858   IBILI 0.4 10/13/2015 1005    Renal Function   Lab Results  Component Value Date   CREATININE 0.69 01/02/2024   CREATININE 1.28 (H) 08/26/2023   CREATININE 1.44 (H) 08/25/2023    Estimated Creatinine Clearance: 86.1 mL/min (by C-G formula based on SCr of 0.69 mg/dL).   [facility] Vascular Lab  Studies:  01/04/24 VAS US  LOWER EXTREMITY VENOUS (DVT): Summary:  RIGHT:   - Findings consistent with age indeterminate deep vein thrombosis  involving the right popliteal vein, and right posterior tibial veins.   ASSESSMENT: Location of DVT: Right popliteal vein, Right distal vein Cause of DVT: provoked by a persistent risk factor  Patient without a prior history of DVT diagnosed with age indeterminate DVT in the right popliteal and posterior tibial veins. His RLE pain and swelling are improving with compression and elevation. Pulmonology NP recommended CTA to assess for PE given positive DVT result. Low concern for PE given patient reported his breathing is at baseline with his COPD, he denies SOB, chest pain, and HR and O2 sat is at his baseline, but  can follow up with pulmonology to discuss further. Primary risk factor for DVT is metastatic colon cancer. CBC and CMET completed yesterday showed platelets 83 and CrCl 103 mL/min. He is at high bleeding risk given active cancer, thrombocytopenia, older age, and antiplatelet and NSAID use. DOACs have also been associated with an increased risk of bleeding in patients with GI cancer. Discussed patient with Dr. Pearline and per his recommendation, plan to start anticoagulation with Lovenox  1 mg/kg (100 mg) twice daily. Prefer Lovenox  as this has a shorter half life than DOACs and prefer twice daily Lovenox  dosing in order to minimize peaks in drug concentration and risk of bleeding. Extensively counseled on s/sx of bleeding and appropriate use of Lovenox . All of patient and his wife's questions were answered. Counseled patient to call cardiology today to discuss if he should continue taking Plavix  or change to an alternative medication while on anticoagulation in order to minimize his bleeding risk and he said he would reach out to them today. Counseled him to avoid all NSAIDs as this further increases risk of bleeding. Encouraged him to take Tylenol  as  needed instead which he was in agreement with. Prescribed a one month supply of Lovenox . No medication access issues identified since he has met his out of pocket maximum. Will defer to oncology for future refills and anticoagulation management and recommendations. He has an appointment scheduled with oncology and lab on 01/17/2024 and said he would go ahead and reach out to them to inform them of the DVT and that he has started treatment.  PLAN: -Start enoxaparin  (Lovenox ) 1 mg/kg (100 mg) every 12 hours. -Expected duration of therapy: per oncology. Therapy started on 01/04/24. -Patient educated on purpose, proper use and potential adverse effects of enoxaparin  (Lovenox ). -Discussed importance of taking medication around the same time every day. -Advised patient of medications to avoid (NSAIDs, aspirin  doses >100 mg daily). -Educated that Tylenol  (acetaminophen ) is the preferred analgesic to lower the risk of bleeding. -Advised patient to alert all providers of anticoagulation therapy prior to starting a new medication or having a procedure. -Emphasized importance of monitoring for signs and symptoms of bleeding (abnormal bruising, prolonged bleeding, nose bleeds, bleeding from gums, discolored urine, black tarry stools). -Educated patient to present to the ED if emergent signs and symptoms of new thrombosis occur. -Counseled patient to wear compression stockings daily, removing at night.  Follow up: DVT Clinic as needed, patient has an appointment with oncology in ~2 weeks  Izetta Henry, PharmD Deep [facility] Thrombosis [facility] Clinical Pharmacist

## 2024-01-04 NOTE — Telephone Encounter (Signed)
 Correction to result note- Patient had CTA in March at was negative for PE, we do not need repeat chest imaging. Referred to DVT clinic and patient was seen today, he was started on Lovenox 

## 2024-01-04 NOTE — Telephone Encounter (Signed)
 MC from patient advising he had been diagnosed with dvt  in right popliteal and posterior tibial veins. He was seen in vein and vascular clinic today and was prescribed lovenox . Patient was advised to contact our office to determine whether he needs to continue plavix .   Spoke with DOD Dr. Santo who reviewed vein and vascular notes from today. Due to risks of GIB in context of provoked DVT he recommends patient hold plavix . Patient advised of this recommendation over MyChart.   Patient is former patient of Dr. Dann and last saw Glendia Ferrier, PA-C. Referral to gen cards placed for closer monitoring of complicated anticoagulation/bleeding risks.

## 2024-01-04 NOTE — Telephone Encounter (Signed)
 We have addressed this already, leslie and the tech sent me a secure message. Referring to DVT clinic and I ordered CTA to assess for acute PE/heart strain. Thank you

## 2024-01-04 NOTE — Patient Instructions (Addendum)
-   Start enoxaparin  (Lovenox ) - 100 mg every 12 hours. - Call your cardiologist to let them know you are starting Lovenox  to see if they would like for you to stop taking Plavix  or change treatment as this further increases risk of bleeding.  - We will defer to oncology for further anticoagulation management and duration of treatment. Please discuss with them at your upcoming appointment as we only gave you a 30 day supply of medication today.  - Avoid NSAIDs like ibuprofen (Advil, Motrin) and naproxen  (Aleve ) as well as aspirin  doses over 100 mg daily. You can take Tylenol  (acetaminophen ) instead if needed. Tylenol  (acetaminophen ) is the preferred over the counter pain medication to lower the risk of bleeding. -It is important to take your medication around the same time every day.  -Be sure to alert all of your health care providers that you are taking an anticoagulant prior to starting a new medication or having a procedure. -Monitor for signs and symptoms of bleeding (abnormal bruising, prolonged bleeding, nose bleeds, bleeding from gums, discolored urine, black tarry stools). If you have fallen and hit your head OR if your bleeding is severe or not stopping, seek emergency care.  -Go to the emergency room if emergent signs and symptoms of new clot occur (new or worse swelling and pain in an arm or leg, shortness of breath, chest pain, fast or irregular heartbeats, lightheadedness, dizziness, fainting, coughing up blood) or if you experience a significant color change (pale or blue) in the extremity that has the DVT.  -We recommend you wear compression stockings (20-30 mmHg) as long as you are having swelling or pain. Be sure to purchase the correct size and take them off at night.   If you have any questions or need to reschedule an appointment, please call [phone]. If you are having an emergency, call 911 or present to the nearest emergency room.   What is a DVT?  -Deep vein thrombosis (DVT)  is a condition in which a blood clot forms in a vein of the deep venous system which can occur in the lower leg, thigh, pelvis, arm, or neck. This condition is serious and can be life-threatening if the clot travels to the arteries of the lungs and causing a blockage (pulmonary embolism, PE). A DVT can also damage veins in the leg, which can lead to long-term venous disease, leg pain, swelling, discoloration, and ulcers or sores (post-thrombotic syndrome).  -Treatment may include taking an anticoagulant medication to prevent more clots from forming and the current clot from growing, wearing compression stockings, and/or surgical procedures to remove or dissolve the clot.

## 2024-01-04 NOTE — Telephone Encounter (Signed)
**Note De-identified  [place] Obfuscation** Please advise 

## 2024-01-04 NOTE — Telephone Encounter (Signed)
 Copied from CRM [id number]. Topic: Clinical - Medical Advice >> Jan 04, 2024  8:19 AM Joesph PARAS wrote: Reason for CRM: Per preliminary report from [facility], patient does have DVT and they would like to know if he needs to be sent to the DVT clinic there for treatment. Please c/b decision at [phone]. >> Jan 04, 2024  9:28 AM Celestine FALCON wrote: Geni from [facility] is calling back again regarding the pt being positive for DVT. Geni is requesting to speak with Almarie Ferrari or someone adjacent regarding if the pt needs to be sent to the DVT clinic. I was able to call Almeda via CAL and he reached out to the clinical team in hopes of WT. Almeda stated he would get her a call back today.   Landry has been notified of results and pt referred to DVT clinic.

## 2024-01-07 NOTE — Telephone Encounter (Signed)
 I called and spoke to pt. Pt informed of Beth's note and verbalized understanding. NFN

## 2024-01-08 ENCOUNTER — Ambulatory Visit: Attending: Cardiology | Admitting: Cardiology

## 2024-01-08 ENCOUNTER — Other Ambulatory Visit (HOSPITAL_COMMUNITY): Payer: Self-pay

## 2024-01-08 ENCOUNTER — Other Ambulatory Visit: Payer: Self-pay | Admitting: *Deleted

## 2024-01-08 ENCOUNTER — Encounter: Payer: Self-pay | Admitting: Cardiology

## 2024-01-08 VITALS — BP 132/72 | HR 84 | Resp 16 | Ht 70.0 in | Wt 221.6 lb

## 2024-01-08 DIAGNOSIS — I2510 Atherosclerotic heart disease of native coronary artery without angina pectoris: Secondary | ICD-10-CM

## 2024-01-08 DIAGNOSIS — I484 Atypical atrial flutter: Secondary | ICD-10-CM

## 2024-01-08 DIAGNOSIS — I82431 Acute embolism and thrombosis of right popliteal vein: Secondary | ICD-10-CM

## 2024-01-08 DIAGNOSIS — I10 Essential (primary) hypertension: Secondary | ICD-10-CM | POA: Diagnosis not present

## 2024-01-08 DIAGNOSIS — E7800 Pure hypercholesterolemia, unspecified: Secondary | ICD-10-CM

## 2024-01-08 DIAGNOSIS — I6523 Occlusion and stenosis of bilateral carotid arteries: Secondary | ICD-10-CM | POA: Diagnosis not present

## 2024-01-08 MED ORDER — LOSARTAN POTASSIUM 25 MG PO TABS
25.0000 mg | ORAL_TABLET | Freq: Every day | ORAL | 3 refills | Status: DC
  Filled 2024-01-08: qty 90, 90d supply, fill #0

## 2024-01-08 MED ORDER — FUROSEMIDE 20 MG PO TABS
20.0000 mg | ORAL_TABLET | Freq: Every day | ORAL | 3 refills | Status: DC | PRN
  Filled 2024-01-08: qty 90, 90d supply, fill #0

## 2024-01-08 NOTE — Progress Notes (Signed)
 Cardiology Office Note:  .   Date:  01/08/2024  ID:  Luis [name], DOB 11/03/1940, MRN [id number] PCP: [name], MD  [facility] Providers Cardiologist:  [name], MD Cardiology APP:  Lelon Glendia DASEN, PA-C   History of Present Illness: .   Luis [name] is a 83 y.o. Caucasian male patient with metastatic colon cancer diagnosed in 2019 SP colectomy, presently on chemotherapy and is being closely followed by [facility], coronary artery disease with history of angioplasty and stenting to his coronary arteries, last stent to proximal LAD in July 2020.  He has had a repeat cardiac catheterization 04/25/2022 revealing patent stents and apical LAD stenosis treated medically.  He is asymptomatic chronically occluded right ICA and mild 1 to 39% stenosis on the left ICA.  Past medical history significant for hypertension, hypercholesterolemia and pulmonary sarcoidosis with reactive airway disease.  Due to right lower extremity edema that is new after a recent travel to [name]  for 6 hours in the car, patient underwent lower extremity venous duplex on 01/04/2024 revealing new age-indeterminate DVT for which she is presently on Lovenox  as per vascular surgery and oncology due to history of GI cancer to reduce risk of bleed.  He now presents to establish cardiac care, previously followed Dr. Dann.   Discussed the use of AI scribe software for clinical note transcription with the patient, who gave verbal consent to proceed.  History of Present Illness Luis [name] is an 83 year old male with coronary artery disease who presents for evaluation of leg swelling and a recent aortic aneurysm finding. He has coronary artery disease with multiple stents placed in 2016 and 2020. A heart catheterization on April 25, 2022, showed all stents were patent with mild disease in a small branch treated with a balloon. He has not used nitroglycerin  for several  years.  An aortic aneurysm measuring 4.2 cm was recently identified on a scan by a pulmonologist. This finding is new and was not mentioned in previous reports. He experiences leg swelling, managed with Lasix . Two weeks ago, the swelling increased significantly, leading him to increase his Lasix  dose from 40 mg to 60 mg daily. Despite this, the swelling persisted, leading to a scan. He is currently taking 40 mg of Lasix  daily.  He developed a blood clot in his leg and is on Lovenox  injections twice daily. He attributes the clot to a recent six-hour drive to [name] . He has not traveled long distances otherwise and has not seen his [facility] doctors since the clot developed.  He experiences occasional weakness in his legs, particularly when walking on inclines, but denies cramping. He wears compression socks and quit smoking cigarettes 22 years ago, though he occasionally smokes cigars.  His medications include metoprolol , rosuvastatin , and gabapentin . He previously took Plavix  but discontinued it due to Lovenox  use. He takes 200 mg of gabapentin  in the evening for neuropathy.  Labs   Lab Results  Component Value Date   CHOL 169 09/15/2022   HDL 76 09/15/2022   LDLCALC 78 09/15/2022   LDLDIRECT 79 02/16/2023   TRIG 81 09/15/2022   CHOLHDL 2.2 09/15/2022   No results found for: LIPOA  Lab Results  Component Value Date   NA 140 01/02/2024   K 3.8 01/02/2024   CO2 30 01/02/2024   GLUCOSE 111 (H) 01/02/2024   BUN 9 01/02/2024   CREATININE 0.69 01/02/2024   CALCIUM  8.0 (L) 01/02/2024   GFR 86.03 01/02/2024  EGFR 88 02/16/2023   GFRNONAA 56 (L) 08/26/2023      Latest Ref Rng & Units 01/02/2024    2:45 PM 08/26/2023    5:00 AM 08/25/2023   12:27 PM  BMP  Glucose 70 - 99 mg/dL 888  756  668   BUN 6 - 23 mg/dL 9  32  22   Creatinine 0.40 - 1.50 mg/dL 9.30  8.71  8.55   Sodium 135 - 145 mEq/L 140  132  129   Potassium 3.5 - 5.1 mEq/L 3.8  4.5  4.1   Chloride 96 - 112 mEq/L 100  99  96    CO2 19 - 32 mEq/L 30  23  22    Calcium  8.4 - 10.5 mg/dL 8.0  8.7  8.7       Latest Ref Rng & Units 08/26/2023    5:00 AM 08/25/2023   12:27 PM 08/24/2023    6:18 PM  CBC  WBC 4.0 - 10.5 K/uL 11.9  10.4  8.8   Hemoglobin 13.0 - 17.0 g/dL 88.1  87.9  87.9   Hematocrit 39.0 - 52.0 % 35.3  37.4  36.2   Platelets 150 - 400 K/uL 173  147  142    Lab Results  Component Value Date   HGBA1C 5.2 07/23/2017    Lab Results  Component Value Date   TSH 1.278 08/25/2023    ROS  Review of Systems  Cardiovascular:  Positive for leg swelling (bilateralankle, R>L). Negative for chest pain, claudication and dyspnea on exertion.   Physical Exam:   VS:  BP 132/72 (BP Location: Left Arm, Patient Position: Sitting, Cuff Size: Large)   Pulse 84   Resp 16   Ht 5' 10 (1.778 m)   Wt 221 lb 9.6 oz (100.5 kg)   SpO2 96%   BMI 31.80 kg/m    Wt Readings from Last 3 Encounters:  01/08/24 221 lb 9.6 oz (100.5 kg)  01/04/24 226 lb (102.5 kg)  01/02/24 222 lb 6.4 oz (100.9 kg)    Physical Exam Neck:     Vascular: No carotid bruit or JVD.  Cardiovascular:     Rate and Rhythm: Normal rate and regular rhythm.     Pulses: Intact distal pulses.     Heart sounds: Normal heart sounds. No murmur heard.    No gallop.  Pulmonary:     Effort: Pulmonary effort is normal.     Breath sounds: Normal breath sounds.  Abdominal:     General: Bowel sounds are normal.     Palpations: Abdomen is soft.  Musculoskeletal:     Right lower leg: Edema (2+ pitting below knee) present.     Left lower leg: Edema (1-2+ ankle) present.    Studies Reviewed: SABRA    Coronary artery disease  S/p 2.25 x 12 mm DES to the D1 in 08/2014 S/p CTO PCI of the RI with DES x 2 in 02/2015 S/p 3 x 24 DES to pLAD, POBA to D1 in 12/2018 Myoview  02/10/2022: No ischemia or infarction, EF 63  LHC 04/25/2022: LAD stent patent, diagonal stent patent, diagonal angioplasty site patent, apical LAD 70 (too small for PCI); RI stent  patent   MYOCARDIAL PERFUSION IMAGING 02/10/2022    The study is normal. The study is low risk.   No ST deviation was noted.   LV perfusion is normal. There is no evidence of ischemia. There is no evidence of infarction.   Left ventricular function is normal. Nuclear  stress EF: 63 %. The left ventricular ejection fraction is normal (55-65%). End diastolic cavity size is normal. End systolic cavity size is normal.   Prior study available for comparison from 01/02/2018.  ECHOCARDIOGRAM COMPLETE 08/26/2023  1. Left ventricular ejection fraction, by estimation, is 60 to 65%. The left ventricle has normal function. The left ventricle has no regional wall motion abnormalities. There is mild concentric left ventricular hypertrophy. Left ventricular diastolic parameters are consistent with Grade I diastolic dysfunction (impaired relaxation). 2.  No significant valvular abnormality.  Carotid artery duplex 05/25/2023:   Right Carotid: Evidence consistent with a total occlusion of the right ICA. The  CCA appears occluded.  Left Carotid: Velocities in the left ICA are consistent with a 1-39% stenosis.  Vertebrals: Left vertebral artery demonstrates antegrade flow. Right  vertebral artery demonstrates no discernable flow.  Subclavians: Normal flow hemodynamics were seen in bilateral subclavian arteries.  Suggest follow up study in No significant change since prior studies  including 04/22/2018. F/U studies if clinically indicated.   Lower Extremity Venous Duplex (R) 01/04/2024: - Findings consistent with age indeterminate deep vein thrombosis  involving the right popliteal vein, and right posterior tibial veins.   EKG:    EKG Interpretation Date/Time:  Tuesday January 08 2024 14:43:53 EDT Ventricular Rate:  82 PR Interval:    QRS Duration:  128 QT Interval:  412 QTC Calculation: 481 R Axis:   -63  Text Interpretation: EKG 01/08/2024: Atrial flutter with 3: 1 conduction at rate of 82 bpm, left  anterior fascicular block.  Right bundle branch block.  Compared to 08/24/2023, sinus with first-degree AV block has been replaced.  Otherwise no change. Confirmed by Daylon Lafavor, [name] ([id number]) on 01/08/2024 6:40:57 PM    Medications ordered    Meds ordered this encounter  Medications   losartan  (COZAAR ) 25 MG tablet    Sig: Take 1 tablet (25 mg total) by mouth daily.    Dispense:  90 tablet    Refill:  3   furosemide  (LASIX ) 20 MG tablet    Sig: Take 1 tablet (20 mg total) by mouth daily as needed for fluid or edema.    Dispense:  90 tablet    Refill:  3     ASSESSMENT AND PLAN: .      ICD-10-CM   1. Coronary artery disease involving native coronary artery of native heart without angina pectoris  I25.10 EKG 12-Lead    losartan  (COZAAR ) 25 MG tablet    Basic Metabolic Panel (BMET)    2. Atypical atrial flutter (HCC)  I48.4     3. Essential hypertension  I10 losartan  (COZAAR ) 25 MG tablet    Basic Metabolic Panel (BMET)    4. Pure hypercholesterolemia  E78.00     5. Bilateral carotid artery stenosis  I65.23     6. Acute deep vein thrombosis (DVT) of popliteal vein of right lower extremity (HCC) 01/04/24  I82.431 furosemide  (LASIX ) 20 MG tablet    VAS US  LOWER EXTREMITY VENOUS (DVT)     Assessment & Plan Coronary artery disease with prior stents Coronary artery disease with multiple stents placed in 2016 and 2020. Last stress test in September 2023 showed no new blockages and normal heart function. No recent angina or heart failure symptoms. Discontinued clopidogrel  while on Lovenox  due to risk of bleeding. - Continue metoprolol  for blood pressure management - Continue rosuvastatin  for cholesterol management - Start losartan  for kidney protection and heart disease management - Now off of Plavix  since being  on Lovenox  for recent DVT right lower extremity. - Lipids under excellent control. - In the absence of angina pectoris, could consider discontinuing Ranexa  on his next office  visit.  New onset atrial flutter Patient has atypical atrial flutter with 3: 1 conduction noted today on the EKG.  He will need long-term anticoagulation.  Presently on Lovenox  in view of history of GI cancers, will discuss with oncology to see whether if low risk for bleeding we could switch him to Eliquis or Xarelto. - Will schedule for EKG to be done in 3 to 4 weeks, if still in persistent atrial flutter, we will schedule for cardioversion. - Schedule for Direct current cardioversion. I have discussed regarding risks benefits rate control vs rhythm control with the patient. Patient understands cardiac arrest and need for CPR, aspiration pneumonia, but not limited to these. Patient is willing.   Primary hypertension - Needs ACE inhibitor's or ARB in view of CAD and mild chronic stage IIIa kidney disease.  Start losartan  25 mg daily, BMP in 2 to 3 weeks.  Lower extremity edema Intermittent lower extremity edema, previously managed with furosemide . Recent increase in swelling managed by increasing furosemide  dose to 60 mg, but now well-managed. Swelling likely related to venous thromboembolism. - Change furosemide  40 mg as needed instead of taking it on a daily basis for edema  Venous thromboembolism on anticoagulation Recent venous thromboembolism, likely related to recent travel and possibly less likely cancer-related hypercoagulability. Currently on Lovenox  for anticoagulation and request oncology to review and recommend regarding transitioning him to oral anticoagulants in view of high CHA2DS2-VASc risk score and need for long-term anticoagulation in view of new onset atrial flutter.. - Schedule venous duplex scan in 2-3 months to reassess clot - Discuss anticoagulation plan with oncologist at [facility] - Metastatic colon cancer initially diagnosed in 2019 with liver involvement. Hepatic pump therapy has been effective in reducing tumor size. Currently managing residual disease. -  Continue current cancer management plan with oncologist - Discuss blood clot management with oncologist at [facility] if he can go on oral anticoagulants  Aortic aneurysm, size and significance under evaluation Aortic aneurysm noted at 4.2 cm by CT scan of the chest being performed for evaluation of lung nodule and metastatic colon cancer surveillance on 11/30/2023 mentions ascending aortic aneurysm not mentioned in any of the prior reports including report from [facility]. Previous imaging did not report aneurysm, suggesting possible over-read or new finding. Requires monitoring as he gets routine CT scans of his chest for cancer screening.  I have discussed with the patient regarding all his above complex and extensive cardiac and noncardiac history, total time spent on evaluation of his medical history, updating his charts, making complex addition was 65 minutes.   Signed,  [name], MD, [facility] 01/08/2024, 6:43 PM [facility] [address] [place], [name] [address] Phone: [phone]. Fax:  [phone]

## 2024-01-08 NOTE — Patient Instructions (Signed)
 Medication Instructions:  Your physician has recommended you make the following change in your medication:  Start losartan  25 mg by mouth daily   *If you need a refill on your cardiac medications before your next appointment, please call your pharmacy*  Lab Work: Have lab work (BMP) done in 2 weeks  Can be done at any [facility] location.  This is not fasting If you have labs (blood work) drawn today and your tests are completely normal, you will receive your results only by: MyChart Message (if you have MyChart) OR A paper copy in the mail If you have any lab test that is abnormal or we need to change your treatment, we will call you to review the results.  Testing/Procedures: Your physician has requested that you have a lower extremity venous duplex in 3 months. . This test is an ultrasound of the veins in the legs. It looks at venous blood flow that carries blood from the heart to the legs   Allow one hour for a Lower Venous exam. There are no restrictions or special instructions.  Please note: We ask at that you not bring children with you during ultrasound (echo/ vascular) testing. Due to room size and safety concerns, children are not allowed in the ultrasound rooms during exams. Our front office staff cannot provide observation of children in our lobby area while testing is being conducted. An adult accompanying a patient to their appointment will only be allowed in the ultrasound room at the discretion of the ultrasound technician under special circumstances. We apologize for any inconvenience.   Follow-Up: At [facility], you and your health needs are our priority.  As part of our continuing mission to provide you with exceptional heart care, our providers are all part of one team.  This team includes your primary Cardiologist (physician) and Advanced Practice Providers or APPs (Physician Assistants and Nurse Practitioners) who all work together to provide you with the care you  need, when you need it.  Your next appointment:   3 month(s)  Provider:   Dr Ladona   We recommend signing up for the patient portal called MyChart.  Sign up information is provided on this After Visit Summary.  MyChart is used to connect with patients for Virtual Visits (Telemedicine).  Patients are able to view lab/test results, encounter notes, upcoming appointments, etc.  Non-urgent messages can be sent to your provider as well.   To learn more about what you can do with MyChart, go to [email or web address].   Other Instructions

## 2024-01-09 DIAGNOSIS — Z947 Corneal transplant status: Secondary | ICD-10-CM | POA: Diagnosis not present

## 2024-01-09 DIAGNOSIS — Z961 Presence of intraocular lens: Secondary | ICD-10-CM | POA: Diagnosis not present

## 2024-01-09 NOTE — Progress Notes (Signed)
 I called and spoke to pt. Pt states he saw his cardiologist yesterday and the cardiologist also prescribes the Lasix  to him. I informed pt to just let our office know if he needs anything. Pt verbalized understanding. NFN

## 2024-01-09 NOTE — Progress Notes (Signed)
 I called and spoke to pt. Pt states this was already handled. NFN

## 2024-01-10 ENCOUNTER — Encounter (INDEPENDENT_AMBULATORY_CARE_PROVIDER_SITE_OTHER): Payer: Self-pay

## 2024-01-10 ENCOUNTER — Encounter: Payer: Self-pay | Admitting: Cardiology

## 2024-01-10 ENCOUNTER — Other Ambulatory Visit: Payer: Self-pay

## 2024-01-10 ENCOUNTER — Telehealth: Payer: Self-pay | Admitting: *Deleted

## 2024-01-10 DIAGNOSIS — R55 Syncope and collapse: Secondary | ICD-10-CM

## 2024-01-10 DIAGNOSIS — I4891 Unspecified atrial fibrillation: Secondary | ICD-10-CM

## 2024-01-10 NOTE — Telephone Encounter (Signed)
 Patient returned RN's call.

## 2024-01-10 NOTE — Telephone Encounter (Signed)
 Cardioversion can be done anytime as he has been on anticoagulation with Lovenox  long-term.  Can you please find out the details of his vision loss, if this is TIA-like symptom, we may have to do TEE cardioversion.

## 2024-01-10 NOTE — Telephone Encounter (Signed)
 I spoke with patient regarding cardioversion.  Patient reports he is currently writing a my chart message to Dr Ladona to see if cardioversion can be done sooner.  Patient reports yesterday he lost his vision and almost collapsed. Describes as seeing stars.  This has happened a couple times over the last few months.  Today everything is better.   Patient will finish my chart message and send to Dr Ladona

## 2024-01-10 NOTE — Telephone Encounter (Signed)
 Dr Ladona would like patient to have cardioversion in 3-4 weeks.  Will need EKG prior to this.  Continue Lovenox .  I placed call to patient to schedule procedure and EKG appointment.   Left message to call office

## 2024-01-10 NOTE — Progress Notes (Signed)
 Specialty Pharmacy Refill Coordination Note  Luis [name] is a 83 y.o. male contacted today regarding refills of specialty medication(s) Dupilumab  (Dupixent )   Patient requested Delivery   Delivery date: 01/11/24   Verified address: [address], [place] [address]   Medication will be filled on 01/10/24.

## 2024-01-10 NOTE — Telephone Encounter (Signed)
 After discussions, patient has starts in his eyes but no vision loss.  He felt near syncopal.  Hence sick sinus syndrome, high degree AV block given his advanced age is also potential.  Patient has been on Lovenox  only for a few days starting 01/04/2024 with recent DVT hence not a candidate for urgent cardioversion.  I would recommend that he wear a monitor, nonlive for heart block/syncope, with parameters not to advise us  for atrial fibrillation as he is in A-fib.  We could also cancel 3-week office visit for EKG as we will be performing event monitor for 2 weeks.

## 2024-01-10 NOTE — Progress Notes (Signed)
 Clinical Intervention Note  Clinical Intervention Notes: Patient reported starting losartan  potassium, no DDIs were identified with his Dupixent .   Clinical Intervention Outcomes: Prevention of an adverse drug event   Silvano LOISE Blair Karel Santa

## 2024-01-11 ENCOUNTER — Encounter: Payer: Self-pay | Admitting: Cardiology

## 2024-01-11 ENCOUNTER — Telehealth: Payer: Self-pay

## 2024-01-11 ENCOUNTER — Telehealth: Payer: Self-pay | Admitting: Cardiology

## 2024-01-11 ENCOUNTER — Other Ambulatory Visit (HOSPITAL_COMMUNITY): Payer: Self-pay

## 2024-01-11 ENCOUNTER — Other Ambulatory Visit: Payer: Self-pay | Admitting: *Deleted

## 2024-01-11 ENCOUNTER — Encounter: Payer: Self-pay | Admitting: *Deleted

## 2024-01-11 ENCOUNTER — Ambulatory Visit: Attending: Cardiology

## 2024-01-11 DIAGNOSIS — I4891 Unspecified atrial fibrillation: Secondary | ICD-10-CM

## 2024-01-11 DIAGNOSIS — R55 Syncope and collapse: Secondary | ICD-10-CM

## 2024-01-11 NOTE — Progress Notes (Unsigned)
 ZIO AT serial # [id number] from office inventory applied to patient.  Enrollment did not drop to [employer] via EMR.  Serial # showing up as an Unregistered monitor.  [employer] representative , Redell Newton, [phone], called and notified.  Email sent to [facility] listing serial #, patient name, dob, mrn, Dr. , Dx.  Request to edit enrollment vendor note French MATSU. Melvenia Browning).

## 2024-01-11 NOTE — Telephone Encounter (Signed)
 Cardioversion scheduled for August 28 at 9:30 with Dr Raford

## 2024-01-11 NOTE — Telephone Encounter (Signed)
 Error incorrect chart

## 2024-01-11 NOTE — Telephone Encounter (Signed)
 error

## 2024-01-11 NOTE — Telephone Encounter (Signed)
 Patient came into office today to have monitor applied. Cardioversion instructions reviewed with patient.

## 2024-01-11 NOTE — Telephone Encounter (Signed)
 [employer] calling with critical results

## 2024-01-11 NOTE — Telephone Encounter (Signed)
 Received call from [employer] Rep.     Cardiac Monitor Alert  Date of alert:  01/11/2024   Patient Name: Luis [name]  DOB: 1940-08-22  MRN: [id number]   [name] Cardiologist: [name], MD  [name] EP:  None    Monitor Information: Long Term Monitor-Live Telemetry [ZioAT]  Reason:  Syncope and Collapse Ordering provider:  Ganji   Alert Atrial Fibrillation/Flutter This is the 1st alert for this rhythm.  The patient has a hx of Atrial Fibrillation/Flutter.  The patient is not currently on anticoagulation Pt is on Lovenox  Anticoagulation medication as of 01/11/2024           enoxaparin  (LOVENOX ) 100 MG/ML injection Inject 1 mL (100 mg total) into the skin every 12 (twelve) hours. Refills per oncology.       Next Cardiology Appointment   Date:  03/07/24  Provider:  [name]    He is symptomatic.  He reports the following symptoms:  The patient was contacted today by [employer] Rep who reported some lightheadedness that did resolve.. Arrhythmia, symptoms and history reviewed with Dr [name].  Plan:  Continue monitoring - Pt with known Afib/flutter     Aldona FORBES Marina, RN  01/11/2024 5:24 PM

## 2024-01-11 NOTE — Telephone Encounter (Signed)
 Reviewed with Dr Ladona and 2 week zio should be live with parameters not to advise us  for atrial fibrillation as he is in A-fib. Cardioversion to be scheduled for 3-4 weeks from now.   Patient made aware.  He will come in to office to have monitor applied today.  I told patient I would schedule cardioversion and contact him with instructions.   Patient reports when he saw stars it blocked his vision.  Felt lightheaded and like he would pass out when it happened the time he was standing

## 2024-01-12 DIAGNOSIS — I4891 Unspecified atrial fibrillation: Secondary | ICD-10-CM | POA: Diagnosis not present

## 2024-01-12 DIAGNOSIS — R55 Syncope and collapse: Secondary | ICD-10-CM | POA: Diagnosis not present

## 2024-01-14 ENCOUNTER — Other Ambulatory Visit

## 2024-01-15 ENCOUNTER — Encounter: Payer: Self-pay | Admitting: Cardiology

## 2024-01-17 DIAGNOSIS — Z5112 Encounter for antineoplastic immunotherapy: Secondary | ICD-10-CM | POA: Diagnosis not present

## 2024-01-17 DIAGNOSIS — D696 Thrombocytopenia, unspecified: Secondary | ICD-10-CM | POA: Diagnosis not present

## 2024-01-17 DIAGNOSIS — C7800 Secondary malignant neoplasm of unspecified lung: Secondary | ICD-10-CM | POA: Diagnosis not present

## 2024-01-17 DIAGNOSIS — J90 Pleural effusion, not elsewhere classified: Secondary | ICD-10-CM | POA: Diagnosis not present

## 2024-01-17 DIAGNOSIS — E876 Hypokalemia: Secondary | ICD-10-CM | POA: Diagnosis not present

## 2024-01-17 DIAGNOSIS — E039 Hypothyroidism, unspecified: Secondary | ICD-10-CM | POA: Diagnosis not present

## 2024-01-17 DIAGNOSIS — Z5111 Encounter for antineoplastic chemotherapy: Secondary | ICD-10-CM | POA: Diagnosis not present

## 2024-01-17 DIAGNOSIS — J449 Chronic obstructive pulmonary disease, unspecified: Secondary | ICD-10-CM | POA: Diagnosis not present

## 2024-01-17 DIAGNOSIS — C189 Malignant neoplasm of colon, unspecified: Secondary | ICD-10-CM | POA: Diagnosis not present

## 2024-01-17 DIAGNOSIS — C19 Malignant neoplasm of rectosigmoid junction: Secondary | ICD-10-CM | POA: Diagnosis not present

## 2024-01-17 DIAGNOSIS — C787 Secondary malignant neoplasm of liver and intrahepatic bile duct: Secondary | ICD-10-CM | POA: Diagnosis not present

## 2024-01-21 ENCOUNTER — Other Ambulatory Visit (HOSPITAL_COMMUNITY): Payer: Self-pay

## 2024-01-21 DIAGNOSIS — I4891 Unspecified atrial fibrillation: Secondary | ICD-10-CM | POA: Diagnosis not present

## 2024-01-21 DIAGNOSIS — R55 Syncope and collapse: Secondary | ICD-10-CM | POA: Diagnosis not present

## 2024-01-22 ENCOUNTER — Ambulatory Visit: Payer: Self-pay | Admitting: Cardiology

## 2024-01-22 LAB — BASIC METABOLIC PANEL WITH GFR
BUN/Creatinine Ratio: 17 (ref 10–24)
BUN: 12 mg/dL (ref 8–27)
CO2: 23 mmol/L (ref 20–29)
Calcium: 8.5 mg/dL — ABNORMAL LOW (ref 8.6–10.2)
Chloride: 103 mmol/L (ref 96–106)
Creatinine, Ser: 0.7 mg/dL — ABNORMAL LOW (ref 0.76–1.27)
Glucose: 224 mg/dL — ABNORMAL HIGH (ref 70–99)
Potassium: 5.6 mmol/L — ABNORMAL HIGH (ref 3.5–5.2)
Sodium: 138 mmol/L (ref 134–144)
eGFR: 92 mL/min/1.73 (ref >59)

## 2024-01-22 LAB — CBC
Hematocrit: 33.6 % — ABNORMAL LOW (ref 37.5–51.0)
Hemoglobin: 10.7 g/dL — ABNORMAL LOW (ref 13.0–17.7)
MCH: 33.3 pg — ABNORMAL HIGH (ref 26.6–33.0)
MCHC: 31.8 g/dL (ref 31.5–35.7)
MCV: 105 fL — ABNORMAL HIGH (ref 79–97)
Platelets: 96 x10E3/uL — CL (ref 150–450)
RBC: 3.21 x10E6/uL — ABNORMAL LOW (ref 4.14–5.80)
RDW: 18.5 % — ABNORMAL HIGH (ref 11.6–15.4)
WBC: 3.3 x10E3/uL — ABNORMAL LOW (ref 3.4–10.8)

## 2024-01-22 NOTE — Progress Notes (Signed)
 Potassium level is up, renal function stable, will discontinue losartan , suspect elevated potassium is related to his underlying metastatic colon cancer and chemotherapy/immunotherapy leading to anemia and immature cells noted on the CBC.  I reviewed his platelet count from [facility] blood draw, very stable.  Hence overall labs are stable to proceed with cardioversion.

## 2024-01-25 ENCOUNTER — Other Ambulatory Visit: Payer: Self-pay | Admitting: Medical Genetics

## 2024-01-28 ENCOUNTER — Other Ambulatory Visit: Payer: Self-pay

## 2024-01-28 ENCOUNTER — Ambulatory Visit: Admitting: Pulmonary Disease

## 2024-01-29 NOTE — Telephone Encounter (Signed)
**Note De-identified  [place] Obfuscation** Please advise 

## 2024-01-29 NOTE — Telephone Encounter (Signed)
 He should stay on 10mg  daily until follow up, please send in RX with 1 refill

## 2024-01-30 ENCOUNTER — Other Ambulatory Visit: Payer: Self-pay | Admitting: Primary Care

## 2024-01-31 ENCOUNTER — Other Ambulatory Visit: Payer: Self-pay

## 2024-01-31 DIAGNOSIS — Z5111 Encounter for antineoplastic chemotherapy: Secondary | ICD-10-CM | POA: Diagnosis not present

## 2024-01-31 DIAGNOSIS — C787 Secondary malignant neoplasm of liver and intrahepatic bile duct: Secondary | ICD-10-CM | POA: Diagnosis not present

## 2024-01-31 DIAGNOSIS — C182 Malignant neoplasm of ascending colon: Secondary | ICD-10-CM | POA: Diagnosis not present

## 2024-01-31 DIAGNOSIS — R918 Other nonspecific abnormal finding of lung field: Secondary | ICD-10-CM | POA: Diagnosis not present

## 2024-01-31 DIAGNOSIS — E039 Hypothyroidism, unspecified: Secondary | ICD-10-CM | POA: Diagnosis not present

## 2024-01-31 DIAGNOSIS — J90 Pleural effusion, not elsewhere classified: Secondary | ICD-10-CM | POA: Diagnosis not present

## 2024-01-31 DIAGNOSIS — R21 Rash and other nonspecific skin eruption: Secondary | ICD-10-CM | POA: Diagnosis not present

## 2024-01-31 DIAGNOSIS — C7801 Secondary malignant neoplasm of right lung: Secondary | ICD-10-CM | POA: Diagnosis not present

## 2024-01-31 DIAGNOSIS — C189 Malignant neoplasm of colon, unspecified: Secondary | ICD-10-CM | POA: Diagnosis not present

## 2024-01-31 DIAGNOSIS — Z5112 Encounter for antineoplastic immunotherapy: Secondary | ICD-10-CM | POA: Diagnosis not present

## 2024-01-31 DIAGNOSIS — J449 Chronic obstructive pulmonary disease, unspecified: Secondary | ICD-10-CM | POA: Diagnosis not present

## 2024-02-01 ENCOUNTER — Telehealth: Payer: Self-pay

## 2024-02-01 NOTE — Telephone Encounter (Signed)
 Copied from CRM [id number]. Topic: Clinical - Prescription Issue >> Jan 30, 2024  2:22 PM Rozanna MATSU wrote: Reason for CRM: PT CALLING ABOUT PRESCRIPTION FOR predniSONE  (DELTASONE ) 10 MG tablet ADVISED IT WAS SENT TO PHARMACY, STATED HE WOULD CHECK WITH THEM AND GIVE US  A CALL BACK IF NEEDED. >> Jan 31, 2024 10:19 AM Chantha C wrote: Patient states called yesterday on predniSONE  (DELTASONE ) 10 MG tablet  and [facility], medication was not sent to the pharmacy. Patient is requesting for refill now, the pharmacy gave patient 6 pills in the meantime. Patient had to disconnect from the call abrupting, having a CT scan done. Please advise and call back.   [id number] [place], [name] - [address] AT [facility] [address] [name] [address] Phone: [phone]  Fax: [phone]    Pt states Rx is at pharmacy ready to be picked up   -NFN

## 2024-02-04 ENCOUNTER — Other Ambulatory Visit: Payer: Self-pay

## 2024-02-04 NOTE — Progress Notes (Signed)
 Specialty Pharmacy Refill Coordination Note  KEM HENSEN is a 83 y.o. male contacted today regarding refills of specialty medication(s) Dupilumab  (Dupixent )   Patient requested Delivery   Delivery date: 02/06/24   Verified address: [address]   [place] [address]   Medication will be filled on 02/05/24.

## 2024-02-05 ENCOUNTER — Other Ambulatory Visit

## 2024-02-05 DIAGNOSIS — Z006 Encounter for examination for normal comparison and control in clinical research program: Secondary | ICD-10-CM

## 2024-02-06 ENCOUNTER — Other Ambulatory Visit: Payer: Self-pay | Admitting: Physician Assistant

## 2024-02-06 DIAGNOSIS — I4891 Unspecified atrial fibrillation: Secondary | ICD-10-CM

## 2024-02-06 DIAGNOSIS — R55 Syncope and collapse: Secondary | ICD-10-CM

## 2024-02-06 NOTE — Progress Notes (Signed)
 His presentation was with near syncope and visual disturbance suggesting sinus node dysfunction and potential for high degree AV block.  He has nearly a 5-second pause although no symptoms reported, may need a pacemaker but for now he is presently on metoprolol  succinate 50 mg daily which needs to be discontinued.   He was also scheduled for cardioversion and his anticoagulation with Lovenox  was changed over to Eliquis and needs a follow-up either in the A-fib clinic or with me to discuss these options and to schedule for direct-current cardioversion.  Would recommend cardioversion first.

## 2024-02-06 NOTE — H&P (View-Only) (Signed)
 His presentation was with near syncope and visual disturbance suggesting sinus node dysfunction and potential for high degree AV block.  He has nearly a 5-second pause although no symptoms reported, may need a pacemaker but for now he is presently on metoprolol  succinate 50 mg daily which needs to be discontinued.   He was also scheduled for cardioversion and his anticoagulation with Lovenox  was changed over to Eliquis and needs a follow-up either in the A-fib clinic or with me to discuss these options and to schedule for direct-current cardioversion.  Would recommend cardioversion first.

## 2024-02-07 ENCOUNTER — Other Ambulatory Visit: Payer: Self-pay

## 2024-02-07 ENCOUNTER — Ambulatory Visit (HOSPITAL_COMMUNITY)

## 2024-02-07 ENCOUNTER — Encounter (HOSPITAL_COMMUNITY)
Admission: RE | Disposition: A | Discharge status: Home / Self Care | Payer: Self-pay | Source: Home / Self Care | Attending: Cardiovascular Disease

## 2024-02-07 ENCOUNTER — Ambulatory Visit (HOSPITAL_COMMUNITY)
Admission: RE | Admit: 2024-02-07 | Discharge: 2024-02-07 | Disposition: A | Discharge status: Home / Self Care | Attending: Cardiovascular Disease | Admitting: Cardiovascular Disease

## 2024-02-07 ENCOUNTER — Encounter (HOSPITAL_COMMUNITY): Payer: Self-pay | Admitting: Cardiovascular Disease

## 2024-02-07 DIAGNOSIS — Z87891 Personal history of nicotine dependence: Secondary | ICD-10-CM

## 2024-02-07 DIAGNOSIS — I82431 Acute embolism and thrombosis of right popliteal vein: Secondary | ICD-10-CM | POA: Diagnosis not present

## 2024-02-07 DIAGNOSIS — I4892 Unspecified atrial flutter: Secondary | ICD-10-CM

## 2024-02-07 DIAGNOSIS — Z79899 Other long term (current) drug therapy: Secondary | ICD-10-CM | POA: Diagnosis not present

## 2024-02-07 DIAGNOSIS — I483 Typical atrial flutter: Secondary | ICD-10-CM | POA: Diagnosis not present

## 2024-02-07 DIAGNOSIS — Z7901 Long term (current) use of anticoagulants: Secondary | ICD-10-CM | POA: Diagnosis not present

## 2024-02-07 DIAGNOSIS — I714 Abdominal aortic aneurysm, without rupture, unspecified: Secondary | ICD-10-CM | POA: Diagnosis not present

## 2024-02-07 DIAGNOSIS — R600 Localized edema: Secondary | ICD-10-CM | POA: Diagnosis not present

## 2024-02-07 DIAGNOSIS — I4891 Unspecified atrial fibrillation: Secondary | ICD-10-CM | POA: Diagnosis not present

## 2024-02-07 DIAGNOSIS — E7800 Pure hypercholesterolemia, unspecified: Secondary | ICD-10-CM | POA: Insufficient documentation

## 2024-02-07 DIAGNOSIS — I2510 Atherosclerotic heart disease of native coronary artery without angina pectoris: Secondary | ICD-10-CM

## 2024-02-07 DIAGNOSIS — I6523 Occlusion and stenosis of bilateral carotid arteries: Secondary | ICD-10-CM | POA: Insufficient documentation

## 2024-02-07 DIAGNOSIS — I10 Essential (primary) hypertension: Secondary | ICD-10-CM | POA: Diagnosis not present

## 2024-02-07 DIAGNOSIS — J449 Chronic obstructive pulmonary disease, unspecified: Secondary | ICD-10-CM | POA: Diagnosis not present

## 2024-02-07 DIAGNOSIS — I484 Atypical atrial flutter: Secondary | ICD-10-CM | POA: Insufficient documentation

## 2024-02-07 DIAGNOSIS — Z955 Presence of coronary angioplasty implant and graft: Secondary | ICD-10-CM | POA: Diagnosis not present

## 2024-02-07 HISTORY — PX: CARDIOVERSION: EP1203

## 2024-02-07 SURGERY — CARDIOVERSION (CATH LAB)
Anesthesia: General

## 2024-02-07 MED ORDER — LIDOCAINE 2% (20 MG/ML) 5 ML SYRINGE
INTRAMUSCULAR | Status: DC | PRN
  Administered 2024-02-07: 60 mg via INTRAVENOUS

## 2024-02-07 MED ORDER — PROPOFOL 10 MG/ML IV BOLUS
INTRAVENOUS | Status: DC | PRN
  Administered 2024-02-07: 50 mg via INTRAVENOUS

## 2024-02-07 MED ORDER — SODIUM CHLORIDE 0.9% FLUSH
INTRAVENOUS | Status: DC | PRN
  Administered 2024-02-07: 3 mL via INTRAVENOUS

## 2024-02-07 SURGICAL SUPPLY — 1 items: PAD DEFIB RADIO PHYSIO CONN (PAD) ×1 IMPLANT

## 2024-02-07 NOTE — CV Procedure (Signed)
 Electrical Cardioversion Procedure Note Luis [name] [id number] June 19, 1940  Procedure: Electrical Cardioversion Indications:  Atrial Flutter  Procedure Details Consent: Risks of procedure as well as the alternatives and risks of each were explained to the (patient/caregiver).  Consent for procedure obtained. Time Out: Verified patient identification, verified procedure, site/side was marked, verified correct patient position, special equipment/implants available, medications/allergies/relevent history reviewed, required imaging and test results available.  Performed  Patient placed on cardiac monitor, pulse oximetry, supplemental oxygen as necessary.  Sedation given: propofol  Pacer pads placed anterior and posterior chest.  Cardioverted 1 time(s).  Cardioverted at 200J.  Evaluation Findings: Post procedure EKG shows: NSR Complications: None Patient did tolerate procedure well.   Annabella Scarce, MD 02/07/2024, 8:30 AM

## 2024-02-07 NOTE — Transfer of Care (Signed)
 Immediate Anesthesia Transfer of Care Note  Patient: Luis [name]  Procedure(s) Performed: CARDIOVERSION  Patient Location: Cath Lab  Anesthesia Type:General  Level of Consciousness: sedated, drowsy, and patient cooperative  Airway & Oxygen Therapy: Patient Spontanous Breathing and Patient connected to nasal cannula oxygen  Post-op Assessment: Report given to RN and Post -op Vital signs reviewed and stable  Post vital signs: Reviewed and stable  Last Vitals:  Vitals Value Taken Time  BP 123/74 08:32  Temp    Pulse 51   Resp 16   SpO2 98     Last Pain:  Vitals:   02/07/24 0815  TempSrc:   PainSc: 0-No pain         Complications: No notable events documented.

## 2024-02-07 NOTE — Discharge Instructions (Signed)

## 2024-02-07 NOTE — Anesthesia Postprocedure Evaluation (Signed)
 Anesthesia Post Note  Patient: Luis [name]  Procedure(s) Performed: CARDIOVERSION     Patient location during evaluation: Cath Lab Anesthesia Type: General Level of consciousness: awake and alert Pain management: pain level controlled Vital Signs Assessment: post-procedure vital signs reviewed and stable Respiratory status: spontaneous breathing, nonlabored ventilation and respiratory function stable Cardiovascular status: blood pressure returned to baseline and stable Postop Assessment: no apparent nausea or vomiting Anesthetic complications: no   No notable events documented.  Last Vitals:  Vitals:   02/07/24 0842 02/07/24 0852  BP: 116/79 123/61  Pulse: (!) 52 (!) 55  Resp: 16 18  Temp:    SpO2: 100% 96%    Last Pain:  Vitals:   02/07/24 0852  TempSrc:   PainSc: 0-No pain                 Garnette FORBES Skillern

## 2024-02-07 NOTE — Anesthesia Preprocedure Evaluation (Addendum)
 Anesthesia Evaluation  Patient identified by MRN, date of birth, ID band Patient awake    Reviewed: Allergy & Precautions, NPO status , Patient's Chart, lab work & pertinent test results  Airway Mallampati: II  TM Distance: >3 FB Neck ROM: Full    Dental  (+) Dental Advisory Given, Edentulous Upper   Pulmonary asthma , COPD, former smoker   Pulmonary exam normal breath sounds clear to auscultation       Cardiovascular hypertension, + CAD and + Cardiac Stents  Normal cardiovascular exam+ dysrhythmias Atrial Fibrillation  Rhythm:Regular Rate:Normal     Neuro/Psych negative neurological ROS  negative psych ROS   GI/Hepatic negative GI ROS, Neg liver ROS,,,  Endo/Other  Hypothyroidism  Obesity   Renal/GU negative Renal ROS     Musculoskeletal  (+) Arthritis ,    Abdominal   Peds  Hematology  (+) Blood dyscrasia (Eliquis)   Anesthesia Other Findings Day of surgery medications reviewed with the patient.  Reproductive/Obstetrics                              Anesthesia Physical Anesthesia Plan  ASA: 4  Anesthesia Plan: General   Post-op Pain Management:    Induction: Intravenous  PONV Risk Score and Plan: 2 and TIVA  Airway Management Planned: Mask  Additional Equipment:   Intra-op Plan:   Post-operative Plan:   Informed Consent: I have reviewed the patients History and Physical, chart, labs and discussed the procedure including the risks, benefits and alternatives for the proposed anesthesia with the patient or authorized representative who has indicated his/her understanding and acceptance.     Dental advisory given  Plan Discussed with: CRNA  Anesthesia Plan Comments:          Anesthesia Quick Evaluation

## 2024-02-07 NOTE — Interval H&P Note (Signed)
 History and Physical Interval Note:  02/07/2024 8:12 AM  Luis [name]  has presented today for surgery, with the diagnosis of AFIB.  The various methods of treatment have been discussed with the patient and family. After consideration of risks, benefits and other options for treatment, the patient has consented to  Procedure(s): CARDIOVERSION (N/A) as a surgical intervention.  The patient's history has been reviewed, patient examined, no change in status, stable for surgery.  I have reviewed the patient's chart and labs.  Questions were answered to the patient's satisfaction.     Annabella Scarce, MD

## 2024-02-08 ENCOUNTER — Telehealth: Payer: Self-pay | Admitting: Cardiology

## 2024-02-08 NOTE — Telephone Encounter (Signed)
 Made patient an appointment with Dr. Ladona on Tuesday.

## 2024-02-08 NOTE — Telephone Encounter (Signed)
 STAT if HR is under 50 or over 120  (normal HR is 60-100 beats per minute)  What is your heart rate? 93   Do you have a log of your heart rate readings (document readings)? 85-119, going up and down   Do you have any other symptoms?  Feeling dizzy when he walks around after cardioversion yesterday   STAT if patient feels like he/she is going to faint   Are you dizzy, lightheaded, or faint now? No because he is sitting down currently   Have you passed out? No IF YES MOVE TO .SYNCOPECVD  Do you have any other symptoms? No   Have you checked your HR and BP (record if available)? See above

## 2024-02-08 NOTE — Telephone Encounter (Signed)
 Called patient back about message. Patient complaining of being tired, dizzy, and he has a headache at the back of his head. Patient had a cardioversion yesterday and was fine after that, but woke up dizzy today. Current BP 127/67 and HR 88. Will send message to DOD, Dr. [place] for advisement. Patient is taking his Ranexa  500 mg BID and eliquis 5 mg BID. Patient stated he was feeling fine yesterday. Patient was on metoprolol  but it was discontinued on 02/06/24 after his monitor results came back.

## 2024-02-12 ENCOUNTER — Other Ambulatory Visit (HOSPITAL_COMMUNITY): Payer: Self-pay

## 2024-02-12 ENCOUNTER — Encounter: Payer: Self-pay | Admitting: Cardiology

## 2024-02-12 ENCOUNTER — Ambulatory Visit: Attending: Cardiology | Admitting: Cardiology

## 2024-02-12 ENCOUNTER — Telehealth: Payer: Self-pay | Admitting: Pharmacy Technician

## 2024-02-12 ENCOUNTER — Encounter: Payer: Self-pay | Admitting: Oncology

## 2024-02-12 VITALS — BP 140/70 | HR 85 | Resp 16 | Ht 70.0 in | Wt 221.8 lb

## 2024-02-12 DIAGNOSIS — I480 Paroxysmal atrial fibrillation: Secondary | ICD-10-CM

## 2024-02-12 DIAGNOSIS — I2510 Atherosclerotic heart disease of native coronary artery without angina pectoris: Secondary | ICD-10-CM | POA: Diagnosis not present

## 2024-02-12 DIAGNOSIS — I453 Trifascicular block: Secondary | ICD-10-CM

## 2024-02-12 MED ORDER — DRONEDARONE HCL 400 MG PO TABS
400.0000 mg | ORAL_TABLET | Freq: Two times a day (BID) | ORAL | 1 refills | Status: DC
  Filled 2024-02-12: qty 120, 60d supply, fill #0

## 2024-02-12 NOTE — Progress Notes (Signed)
 Cardiology Office Note:  .   Date:  02/12/2024  ID:  Luis [name], DOB May 16, 1941, MRN [id number] PCP: [name], MD  [facility] Providers Cardiologist:  [name], MD Cardiology APP:  Lelon Glendia DASEN, PA-C   History of Present Illness: .   Luis [name] is a 83 y.o. Caucasian male patient with metastatic colon cancer diagnosed in 2019 SP colectomy, presently on chemotherapy and is being closely followed by [facility], coronary artery disease with history of angioplasty and stenting to his coronary arteries, last stent to proximal LAD in July 2020.    He has had a repeat cardiac catheterization 04/25/2022 revealing patent stents and apical LAD stenosis treated medically.    He is asymptomatic chronically occluded right ICA and mild 1 to 39% stenosis on the left ICA.  Past medical history significant for hypertension which is now resolved since being on chemotherapy for colon cancer, hypercholesterolemia and pulmonary sarcoidosis with reactive airway disease.    Patient has been on anticoagulation due to history of colon cancer and patient developed DVT of indeterminate age on 01/04/2024 after a travel to [name]  and sitting in the car for 6 hours.  I had seen him in January 08, 2024 where he was found to be in atrial flutter with 3: 1 conduction and schedule him for cardioversion.   Patient underwent direct-current cardioversion with 200 J x 1 and converted to sinus rhythm on 02/07/2024, felt well but the following day did not feel well, called our office and made an appointment.  States that he has noticed significant improvement in his dyspnea since cardioversion.  He was having dizziness but this is now resolved and today he is feeling good and is accompanied by his wife.  Cardiac Studies relevent.    ECHOCARDIOGRAM COMPLETE 08/26/2023  1. Left ventricular ejection fraction, by estimation, is 60 to 65%. The left ventricle has normal function. The left  ventricle has no regional wall motion abnormalities.  There is mild concentric left ventricular hypertrophy. Left ventricular diastolic parameters are consistent with Grade I diastolic dysfunction (impaired relaxation). 2.  No significant valvular abnormality.  Carotid artery duplex 05/25/2023:   Right Carotid: Evidence consistent with a total occlusion of the right ICA. The  CCA appears occluded.  Left Carotid: Velocities in the left ICA are consistent with a 1-39% stenosis.   Coronary artery disease  S/p 2.25 x 12 mm DES to the D1 in 08/2014 S/p CTO PCI of the RI with DES x 2 in 02/2015 S/p 3 x 24 DES to pLAD, POBA to D1 in 12/2018   LHC 04/25/2022:  LAD stent patent, diagonal stent patent, diagonal angioplasty site patent, apical LAD 70%  (small for PCI); RI stent patent    Labs   Lab Results  Component Value Date   CHOL 169 09/15/2022   HDL 76 09/15/2022   LDLCALC 78 09/15/2022   LDLDIRECT 79 02/16/2023   TRIG 81 09/15/2022   CHOLHDL 2.2 09/15/2022   No results found for: LIPOA  Recent Labs    08/24/23 1818 08/25/23 1227 08/26/23 0500 01/02/24 1445 01/21/24 1316  NA 131* 129* 132* 140 138  K 4.6 4.1 4.5 3.8 5.6*  CL 99 96* 99 100 103  CO2 23 22 23 30 23   GLUCOSE 116* 331* 243* 111* 224*  BUN 20 22 32* 9 12  CREATININE 0.98 1.44* 1.28* 0.69 0.70*  CALCIUM  8.5* 8.7* 8.7* 8.0* 8.5*  GFRNONAA >60 49* 56*  --   --  Lab Results  Component Value Date   ALT 32 08/25/2023   AST 47 (H) 08/25/2023   ALKPHOS 235 (H) 08/25/2023   BILITOT 1.0 08/25/2023      Latest Ref Rng & Units 01/21/2024    1:16 PM 08/26/2023    5:00 AM 08/25/2023   12:27 PM  CBC  WBC 3.4 - 10.8 x10E3/uL 3.3  11.9  10.4   Hemoglobin 13.0 - 17.7 g/dL 89.2  88.1  87.9   Hematocrit 37.5 - 51.0 % 33.6  35.3  37.4   Platelets 150 - 450 x10E3/uL 96  173  147    Lab Results  Component Value Date   HGBA1C 5.2 07/23/2017    Lab Results  Component Value Date   TSH 1.278 08/25/2023    Care  everywhere lab 02/07/2024: Sodium 135 - 145 mmol/L 135  Potassium 3.5 - 5.0 mmol/L 4.8  Chloride 98 - 108 mmol/L 103  Carbon Dioxide (CO2) 21 - 30 mmol/L 24  Urea Nitrogen (BUN) 7 - 20 mg/dL 11  Creatinine 0.6 - 1.3 mg/dL 0.7    ROS  Review of Systems  Cardiovascular:  Positive for dyspnea on exertion (chronic). Negative for chest pain and leg swelling.   Physical Exam:   VS:  BP (!) 140/70 (BP Location: Left Arm, Patient Position: Sitting, Cuff Size: Large)   Pulse 85   Resp 16   Ht 5' 10 (1.778 m)   Wt 221 lb 12.8 oz (100.6 kg)   SpO2 97%   BMI 31.82 kg/m    Wt Readings from Last 3 Encounters:  02/12/24 221 lb 12.8 oz (100.6 kg)  01/08/24 221 lb 9.6 oz (100.5 kg)  01/04/24 226 lb (102.5 kg)    BP Readings from Last 3 Encounters:  02/12/24 (!) 140/70  02/07/24 123/61  01/08/24 132/72   Physical Exam Neck:     Vascular: No JVD.  Cardiovascular:     Rate and Rhythm: Normal rate and regular rhythm.     Pulses: Intact distal pulses.          Dorsalis pedis pulses are 0 on the right side and 0 on the left side.       Posterior tibial pulses are 0 on the right side and 0 on the left side.     Heart sounds: S1 normal and S2 normal. Murmur heard.     Early systolic murmur is present with a grade of 2/6 at the upper right sternal border.     No gallop.  Pulmonary:     Effort: Pulmonary effort is normal.     Breath sounds: Normal breath sounds.  Abdominal:     General: Bowel sounds are normal.     Palpations: Abdomen is soft.  Musculoskeletal:     Right lower leg: Edema (2+ ankle edema) present.     Left lower leg: Edema (2+ ankle edema) present.    EKG:    EKG Interpretation Date/Time:  Tuesday February 12 2024 14:15:50 EDT Ventricular Rate:  85 PR Interval:  228 QRS Duration:  130 QT Interval:  414 QTC Calculation: 492 R Axis:   -73  Text Interpretation: EKG 02/12/2024: Sinus rhythm with first-degree AV block at the rate of 85 bpm, left anterior  fascicular block, right bundle branch block.  Trifascicular block.  Occasional PACs.  Compared to 02/07/2024, heart rate has increased from 52 bpm but otherwise no change.  Compared to 01/08/2024, atrial flutter not present. Reconfirmed by [name], [name] ([id number]) on 02/12/2024 3:05:09  PM    ASSESSMENT AND PLAN: .      ICD-10-CM   1. Paroxysmal atrial fibrillation (HCC)  I48.0 dronedarone  (MULTAQ ) 400 MG tablet    2. Trifascicular block  I45.3     3. Coronary artery disease involving native coronary artery of native heart without angina pectoris  I25.10 EKG 12-Lead      1. Paroxysmal atrial fibrillation (HCC) (Primary) Patient is presently maintaining sinus rhythm.  However I am concerned that in view of his underlying COPD, advanced age, underlying CAD, he may revert back into atrial fibrillation.  He is symptomatically better with improved dyspnea since cardioversion.  Will start him on antiarrhythmic therapy with Multaq . Plan to see him back in 3 months with repeat EKG.  Presently on Eliquis, continue the same. - dronedarone  (MULTAQ ) 400 MG tablet; Take 1 tablet (400 mg total) by mouth 2 (two) times daily with a meal.  Dispense: 180 tablet; Refill: 1  2. Trifascicular block Discussed with the patient regarding trifascicular block.  This makes management of atrial fibrillation rhythm control strategy difficult.  I will try mild tach as dictated above, could consider Tikosyn however again in view of his ongoing cancer treatment, changes in his blood pressure which has been soft since therapy for GI cancer, probably Multaq  would be best option.  3. Coronary artery disease involving native coronary artery of native heart without angina pectoris With regard to coronary artery disease, he has remained stable without angina pectoris.  He would like to discontinue Ranexa .  Advised him to do so and if he has recurrence of angina, to restart Ranexa . - EKG 12-Lead  I reviewed his labs from Care  Everywhere, after initiation of losartan  for CAD, his potassium level had gone up to 5.3-5.4 which is now back to baseline at 4.8.  Hence ACE inhibitors and ARB's are contraindicated.  Patient also does not like to add more medications to his present medical regimen especially if you have to use therapy for hypercalcemia.  Follow up: 3 months for follow-up of PAF, high degree AV block making his management extremely complex.  His blood pressure was slightly elevated today however patient states that his blood pressure has been very well-controlled and in fact has been low when he goes for chemotherapy at [facility].  Apart from complexity of his underlying medical issues, time spent in review of his records and coordination of care 39 minutes.  Signed,  [name], MD, [facility] 02/12/2024, 9:01 PM [facility] [address] [place], [name] [address] Phone: [phone]. Fax:  [phone]

## 2024-02-12 NOTE — Telephone Encounter (Signed)
 Pharmacy Patient Advocate Encounter  Received notification from [employer] that Prior Authorization for Multaq  has been APPROVED from 06/13/23 to 06/11/24   PA #/Case ID/Reference #: [id number]  Pharmacy notified

## 2024-02-12 NOTE — Telephone Encounter (Signed)
 Pharmacy Patient Advocate Encounter   Received notification from Patient Pharmacy that prior authorization for Multaq  is required/requested.   Insurance verification completed.   The patient is insured through [employer] .   Per test claim: PA required; PA submitted to above mentioned insurance via Latent Key/confirmation #/EOC [id number] Status is pending

## 2024-02-12 NOTE — Patient Instructions (Signed)
 Medication Instructions:  START Multaq  400 mg twice daily *If you need a refill on your cardiac medications before your next appointment, please call your pharmacy*  Lab Work: NONE If you have labs (blood work) drawn today and your tests are completely normal, you will receive your results only by: MyChart Message (if you have MyChart) OR A paper copy in the mail If you have any lab test that is abnormal or we need to change your treatment, we will call you to review the results.  Testing/Procedures: NONE  Follow-Up: At [facility], you and your health needs are our priority.  As part of our continuing mission to provide you with exceptional heart care, our providers are all part of one team.  This team includes your primary Cardiologist (physician) and Advanced Practice Providers or APPs (Physician Assistants and Nurse Practitioners) who all work together to provide you with the care you need, when you need it.  Your next appointment:   3 months   Provider:   [name], MD    We recommend signing up for the patient portal called MyChart.  Sign up information is provided on this After Visit Summary.  MyChart is used to connect with patients for Virtual Visits (Telemedicine).  Patients are able to view lab/test results, encounter notes, upcoming appointments, etc.  Non-urgent messages can be sent to your provider as well.   To learn more about what you can do with MyChart, go to [email or web address].

## 2024-02-13 ENCOUNTER — Other Ambulatory Visit (HOSPITAL_COMMUNITY): Payer: Self-pay

## 2024-02-13 ENCOUNTER — Ambulatory Visit: Admitting: Nurse Practitioner

## 2024-02-14 DIAGNOSIS — C189 Malignant neoplasm of colon, unspecified: Secondary | ICD-10-CM | POA: Diagnosis not present

## 2024-02-14 DIAGNOSIS — Z5111 Encounter for antineoplastic chemotherapy: Secondary | ICD-10-CM | POA: Diagnosis not present

## 2024-02-14 DIAGNOSIS — C787 Secondary malignant neoplasm of liver and intrahepatic bile duct: Secondary | ICD-10-CM | POA: Diagnosis not present

## 2024-02-15 DIAGNOSIS — C189 Malignant neoplasm of colon, unspecified: Secondary | ICD-10-CM | POA: Diagnosis not present

## 2024-02-15 DIAGNOSIS — Z5111 Encounter for antineoplastic chemotherapy: Secondary | ICD-10-CM | POA: Diagnosis not present

## 2024-02-15 DIAGNOSIS — Z5112 Encounter for antineoplastic immunotherapy: Secondary | ICD-10-CM | POA: Diagnosis not present

## 2024-02-15 DIAGNOSIS — C787 Secondary malignant neoplasm of liver and intrahepatic bile duct: Secondary | ICD-10-CM | POA: Diagnosis not present

## 2024-02-15 LAB — GENECONNECT MOLECULAR SCREEN: Genetic Analysis Overall Interpretation: NEGATIVE

## 2024-02-25 DIAGNOSIS — I4891 Unspecified atrial fibrillation: Secondary | ICD-10-CM | POA: Diagnosis not present

## 2024-02-25 DIAGNOSIS — M5416 Radiculopathy, lumbar region: Secondary | ICD-10-CM | POA: Diagnosis not present

## 2024-02-25 DIAGNOSIS — M48061 Spinal stenosis, lumbar region without neurogenic claudication: Secondary | ICD-10-CM | POA: Diagnosis not present

## 2024-02-25 DIAGNOSIS — R29898 Other symptoms and signs involving the musculoskeletal system: Secondary | ICD-10-CM | POA: Diagnosis not present

## 2024-02-27 DIAGNOSIS — M545 Low back pain, unspecified: Secondary | ICD-10-CM | POA: Diagnosis not present

## 2024-02-27 DIAGNOSIS — M48062 Spinal stenosis, lumbar region with neurogenic claudication: Secondary | ICD-10-CM | POA: Diagnosis not present

## 2024-02-28 ENCOUNTER — Other Ambulatory Visit: Payer: Self-pay

## 2024-02-28 ENCOUNTER — Other Ambulatory Visit (HOSPITAL_COMMUNITY): Payer: Self-pay

## 2024-02-28 DIAGNOSIS — E039 Hypothyroidism, unspecified: Secondary | ICD-10-CM | POA: Diagnosis not present

## 2024-02-28 DIAGNOSIS — Z5112 Encounter for antineoplastic immunotherapy: Secondary | ICD-10-CM | POA: Diagnosis not present

## 2024-02-28 DIAGNOSIS — Z5111 Encounter for antineoplastic chemotherapy: Secondary | ICD-10-CM | POA: Diagnosis not present

## 2024-02-28 DIAGNOSIS — C787 Secondary malignant neoplasm of liver and intrahepatic bile duct: Secondary | ICD-10-CM | POA: Diagnosis not present

## 2024-02-28 DIAGNOSIS — C180 Malignant neoplasm of cecum: Secondary | ICD-10-CM | POA: Diagnosis not present

## 2024-02-28 DIAGNOSIS — K219 Gastro-esophageal reflux disease without esophagitis: Secondary | ICD-10-CM | POA: Diagnosis not present

## 2024-02-28 DIAGNOSIS — J449 Chronic obstructive pulmonary disease, unspecified: Secondary | ICD-10-CM | POA: Diagnosis not present

## 2024-02-28 DIAGNOSIS — C182 Malignant neoplasm of ascending colon: Secondary | ICD-10-CM | POA: Diagnosis not present

## 2024-02-28 DIAGNOSIS — E876 Hypokalemia: Secondary | ICD-10-CM | POA: Diagnosis not present

## 2024-02-28 NOTE — Progress Notes (Signed)
 Specialty Pharmacy Refill Coordination Note  Luis [name] is a 83 y.o. male contacted today regarding refills of specialty medication(s) Dupilumab  (Dupixent )   Patient requested Delivery   Delivery date: 03/11/24   Verified address: [address]   [place] [address]   Medication will be filled on 03/10/24.

## 2024-02-29 ENCOUNTER — Other Ambulatory Visit: Payer: Self-pay

## 2024-02-29 NOTE — Progress Notes (Signed)
 Patient called regarding dupixent  storage. Per patient he left injection out for two days at room temp. Dupixent  is safe for use up to 14 days at room temp.

## 2024-03-05 ENCOUNTER — Ambulatory Visit: Payer: Self-pay | Admitting: Primary Care

## 2024-03-05 MED ORDER — PREDNISONE 10 MG PO TABS: ORAL_TABLET | ORAL | 0 refills | Status: DC

## 2024-03-05 NOTE — Telephone Encounter (Signed)
 Prednisone  20 mg for 7 days then stay on prednisone  10 mg daily thereafter.

## 2024-03-05 NOTE — Telephone Encounter (Signed)
 Make sure he is using Breztri  inhaler two puffs twice daily along with Dupixent  injections q 14 days   I can send in prednisone  20mg  x 5 days, then stay on 10mg  daily until follow-up  He needs a visit with Dr. Annella first available

## 2024-03-05 NOTE — Telephone Encounter (Signed)
**Note De-identified  [place] Obfuscation** Please advise 

## 2024-03-05 NOTE — Telephone Encounter (Signed)
 Dr. Annella The patient is refusing to go to the ER and is requesting prednisone .  I called and spoke with patient, offered appointment today and he declined.  He states that he has discussed with Almarie Ferrari NP, he was on prednisone  for 2 weeks to see if it helped with the sob and wheezing.  He said the prednisone  helped with the sob and wheezing while he was on the prednisone .  As soon as he comes off of the prednisone  the wheezing/sob comes back.  He sent her a message and let her know, the prescription was refilled.  He just finished that prescription.  He discussed the concerns she had with him being on long term prednisone  and he understands the concerns and is willing to take the risk. Given the trial with the prednisone , what are your thoughts on him continuing prednisone  going forward?  He verbalized understanding.

## 2024-03-05 NOTE — Telephone Encounter (Signed)
Pt is aware. Nothing further needed 

## 2024-03-05 NOTE — Telephone Encounter (Signed)
 FYI Only or Action Required?: Action required by provider: requesting prednisone  prescription.  Patient is followed in Pulmonology for COPD, sarcoidosis, last seen on 01/02/2024 by Hope Almarie ORN, NP.  Called Nurse Triage reporting Shortness of Breath.  Symptoms began 03/01/2024.  Interventions attempted: Rescue inhaler, Maintenance inhaler, and Nebulizer treatments.  Symptoms are: gradually worsening.  Triage Disposition: Go to ED Now (Notify PCP)  Patient/caregiver understands and will follow disposition?: No, refuses disposition  Copied from CRM [id number]. Topic: Clinical - Red Word Triage >> Mar 05, 2024 10:56 AM Rilla B wrote: [facility] that prompted transfer to Nurse Triage: Shortness of Breath Reason for Disposition  [1] MODERATE difficulty breathing (e.g., speaks in phrases, SOB even at rest, pulse 100-120) AND [2] NEW-onset or WORSE than normal  Answer Assessment - Initial Assessment Questions Patient with hx of sarcoidosis calling about wanting to get a prescription for prednisone . Patient has been messaging with office about prescription. Patient is short of breathe with speaking to nurse triage, at times having to take breaths in between words. Patient and wife both states that he severely short of breath with movement. Patient states shortness of breath started on 9/20-last took prednisone  on 9/17. Patient states nebulizer and inhalers do not help. Patient refusing ED recommendation. Patient is wanting a prednisone  prescription  1. RESPIRATORY STATUS: Describe your breathing? (e.g., wheezing, shortness of breath, unable to speak, severe coughing)      Shortness of breath, wheezing  2. ONSET: When did this breathing problem begin?      9/20-took last prednisone  on 9/17 3. PATTERN Does the difficult breathing come and go, or has it been constant since it started?      constant 4. SEVERITY: How bad is your breathing? (e.g., mild, moderate, severe)      Moderate to  severe with activity.  5. RECURRENT SYMPTOM: Have you had difficulty breathing before? If Yes, ask: When was the last time? and What happened that time?      yes 6. CARDIAC HISTORY: Do you have any history of heart disease? (e.g., heart attack, angina, bypass surgery, angioplasty)      yes 7. LUNG HISTORY: Do you have any history of lung disease?  (e.g., pulmonary embolus, asthma, emphysema)     yes 8. CAUSE: What do you think is causing the breathing problem?      Been off steroids since 9/17 9. OTHER SYMPTOMS: Do you have any other symptoms? (e.g., chest pain, cough, dizziness, fever, runny nose)     Cough, runny nose 10. O2 SATURATION MONITOR:  Do you use an oxygen saturation monitor (pulse oximeter) at home? If Yes, ask: What is your reading (oxygen level) today? What is your usual oxygen saturation reading? (e.g., 95%)       94% 12. TRAVEL: Have you traveled out of the country in the last month? (e.g., travel history, exposures)       no  Protocols used: Breathing Difficulty-A-AH

## 2024-03-07 ENCOUNTER — Ambulatory Visit: Admitting: Cardiology

## 2024-03-07 NOTE — Telephone Encounter (Signed)
 Duplicate encounter

## 2024-03-10 ENCOUNTER — Other Ambulatory Visit: Payer: Self-pay

## 2024-03-13 DIAGNOSIS — C787 Secondary malignant neoplasm of liver and intrahepatic bile duct: Secondary | ICD-10-CM | POA: Diagnosis not present

## 2024-03-13 DIAGNOSIS — Z5112 Encounter for antineoplastic immunotherapy: Secondary | ICD-10-CM | POA: Diagnosis not present

## 2024-03-13 DIAGNOSIS — C19 Malignant neoplasm of rectosigmoid junction: Secondary | ICD-10-CM | POA: Diagnosis not present

## 2024-03-13 DIAGNOSIS — D869 Sarcoidosis, unspecified: Secondary | ICD-10-CM | POA: Diagnosis not present

## 2024-03-13 DIAGNOSIS — C7800 Secondary malignant neoplasm of unspecified lung: Secondary | ICD-10-CM | POA: Diagnosis not present

## 2024-03-13 DIAGNOSIS — J449 Chronic obstructive pulmonary disease, unspecified: Secondary | ICD-10-CM | POA: Diagnosis not present

## 2024-03-13 DIAGNOSIS — C189 Malignant neoplasm of colon, unspecified: Secondary | ICD-10-CM | POA: Diagnosis not present

## 2024-03-13 DIAGNOSIS — E039 Hypothyroidism, unspecified: Secondary | ICD-10-CM | POA: Diagnosis not present

## 2024-03-13 DIAGNOSIS — Z5111 Encounter for antineoplastic chemotherapy: Secondary | ICD-10-CM | POA: Diagnosis not present

## 2024-03-17 DIAGNOSIS — M48061 Spinal stenosis, lumbar region without neurogenic claudication: Secondary | ICD-10-CM | POA: Diagnosis not present

## 2024-03-17 DIAGNOSIS — M6281 Muscle weakness (generalized): Secondary | ICD-10-CM | POA: Diagnosis not present

## 2024-03-18 DIAGNOSIS — M48061 Spinal stenosis, lumbar region without neurogenic claudication: Secondary | ICD-10-CM | POA: Diagnosis not present

## 2024-03-18 DIAGNOSIS — M6281 Muscle weakness (generalized): Secondary | ICD-10-CM | POA: Diagnosis not present

## 2024-03-21 ENCOUNTER — Inpatient Hospital Stay: Attending: Oncology | Admitting: Oncology

## 2024-03-21 ENCOUNTER — Encounter: Payer: Self-pay | Admitting: Oncology

## 2024-03-21 VITALS — BP 165/83 | HR 75 | Temp 97.9°F | Resp 18 | Ht 70.0 in | Wt 224.9 lb

## 2024-03-21 DIAGNOSIS — C787 Secondary malignant neoplasm of liver and intrahepatic bile duct: Secondary | ICD-10-CM | POA: Insufficient documentation

## 2024-03-21 DIAGNOSIS — C7800 Secondary malignant neoplasm of unspecified lung: Secondary | ICD-10-CM | POA: Diagnosis not present

## 2024-03-21 DIAGNOSIS — C180 Malignant neoplasm of cecum: Secondary | ICD-10-CM | POA: Insufficient documentation

## 2024-03-21 NOTE — Progress Notes (Signed)
 [facility] OFFICE PROGRESS NOTE   Diagnosis: Colon cancer  INTERVAL HISTORY:   [name] returns for a scheduled visit.  He continues systemic therapy under the direction of Dr. Brenna.  He is currently being treated with 5-FU/panitumumab .  He reports improvement in the skin rash since he was placed on Dupixent  for COPD.  He reports intentional weight loss.  Objective:  Vital signs in last 24 hours:  Blood pressure (!) 165/83, pulse 75, temperature 97.9 F (36.6 C), temperature source Temporal, resp. rate 18, height 5' 10 (1.778 m), weight 224 lb 14.4 oz (102 kg), SpO2 93%.    HEENT: No thrush or ulcers Lymphatics: No cervical, supraclavicular, or axillary nodes Resp: Decreased breath sounds with end inspiratory rhonchi at the right posterior base, no respiratory distress Cardio: Regular rate and rhythm GI: No hepatosplenomegaly, left abdomen infusion pump Vascular: 1+ pitting edema at the right greater than left lower leg  Skin: Diffuse dryness, acne type rash over the chest  Portacath/PICC-without erythema  Lab Results:  Lab Results  Component Value Date   WBC 3.3 (L) 01/21/2024   HGB 10.7 (L) 01/21/2024   HCT 33.6 (L) 01/21/2024   MCV 105 (H) 01/21/2024   PLT 96 (LL) 01/21/2024   NEUTROABS 9.7 (H) 08/25/2023    CMP  Lab Results  Component Value Date   NA 138 01/21/2024   K 5.6 (H) 01/21/2024   CL 103 01/21/2024   CO2 23 01/21/2024   GLUCOSE 224 (H) 01/21/2024   BUN 12 01/21/2024   CREATININE 0.70 (L) 01/21/2024   CALCIUM  8.5 (L) 01/21/2024   PROT 6.4 (L) 08/25/2023   ALBUMIN 3.0 (L) 08/25/2023   AST 47 (H) 08/25/2023   ALT 32 08/25/2023   ALKPHOS 235 (H) 08/25/2023   BILITOT 1.0 08/25/2023   GFRNONAA 56 (L) 08/26/2023   GFRAA >60 03/10/2020     Medications: I have reviewed the patient's current medications.   Assessment/Plan:  Adenocarcinoma of the cecum, stage IIA (T3N0), status post a right colectomy 07/26/2017 0/16 lymph  nodes positive, no lymphovascular invasion, perineural invasion present MSI-stable, no loss of mismatch repair protein expression Surveillance colonoscopy 07/26/2018-no polyps found Elevated CEA June 2020, persistently elevated August 2020 CTs 02/07/2019- bilateral liver metastases, no extrahepatic metastatic disease, changes of sarcoidosis in the chest Biopsy liver lesion 03/04/2019-metastatic adenocarcinoma, with morphology consistent with metastatic colonic adenocarcinoma, RAS WT Cycle 1 FOLFOX 03/12/2019 Cycle 2 FOLFOX 03/26/2019, oxaliplatin  dose reduced secondary to thrombocytopenia Cycle 3 FOLFOX 04/09/2019, oxaliplatin  further dose reduced and aspirin  held secondary to thrombocytopenia Cycle 4 FOLFOX 04/23/2019 Cycle 5 FOLFOX 05/07/2019 CTs 05/23/2019-decrease in liver lesions.  No new or progressive findings. Cycle 6 FOLFOX 05/27/2019 Cycle 7 FOLFOX 06/14/2019 Cycle 8 FOLFOX 07/02/2019-Udenyca  held Cycle 9 FOLFOX 07/16/2019-Udenyca  held Cycle 10 FOLFOX 07/30/2019-Udenyca  held CT abdomen/pelvis 08/13/2019-mild improvement in previously noted liver lesions MRI abdomen 08/13/2019-3 liver lesions  Cycle 1 FOLFIRI/Avastin  09/17/2019 Cycle 2 FOLFIRI/Avastin  10/01/2019 Cycle 3 FOLFIRI/Avastin  10/15/2019 Cycle 4 FOLFIRI/Avastin  11/05/2019 Cycle 5 FOLFIRI/Avastin  11/19/2019 CTs 11/27/2019-stable liver lesions; no new liver lesions.  No other evidence of metastatic disease in the abdomen or pelvis. Cycle 1 FOLFIRI/Panitumumab  12/03/2019 Cycle 2 FOLFIRI/Panitumumab  12/17/2019 (Panitumumab  dose reduced due to rash) Cycle 3 FOLFIRI/Panitumumab  01/01/2020 Cycle 4 FOLFIRI/Panitumumab  01/14/2020 Cycle 5 FOLFIRI/Panitumumab  01/28/2020 CT abdomen/pelvis 02/06/2020-decreased size of right hepatic lesions, subtle lesion in the lateral left hepatic lobe-not clearly identified, no evidence of disease progression Cycle 6 FOLFIRI/Panitumumab  02/11/2020 Cycle 7 FOLFIRI/Panitumumab  03/10/2020 Cycle 8 FOLFIRI/Panitumumab   03/26/2020 Cycle 9 FOLFIRI/Panitumumab   04/14/2020 CT abdomen/pelvis 04/26/2020-mild enlargement of 2 liver lesions on initial report, repeat review with radiology consistent with stable disease Cycle 10 FOLFIRI/Panitumumab  04/29/2020 MRI liver 05/19/2020-right hepatic lobe lesions diminished in size when compared to the study of March 2021.  Lesion in hepatic subsegment VI with very limited assessment as well due to adjacent bowel. 05/20/2020 referred to interventional radiology to consider ablation of liver lesions CT abdomen 06/30/2020 prior to planned ablation procedure, enlargement of segment 7 liver lesion and segment 6 liver lesion.  Interval recurrence of segment 2 lesion measuring 4.5 cm, ablation aborted 07/23/2020-diagnostic laparoscopy, robotic assisted insertion of hepatic infusion pump, portal lymphadenectomy-granulomatous inflammation compatible with sarcoidosis, core tumor biopsy segment 6, infiltrating adenocarcinoma 08/02/2020-cycle 1 FUDR  at 50% dose 08/16/2020-FOLFIRI/panitumumab  CTs 10/25/2020-significantly decreased size of hepatic metastases 10/25/2020 cycle 6 FOLFIRI/Panitumumab , FUDR  # 4 11/09/2020-cycle 7 FOLFIRI/panitumumab  01/06/2021-diagnostic laparoscopy, partial hepatectomy-segment 2, segment 6, microwave ablation segment 7-3 tumors identified, segment 2 and 6 tumors resected-adenocarcinoma with negative margins, segment 2 lesion with less than 1 mm hepatic parenchymal margin 03/28/2021-CTs with slight increase in right pleural effusion, CEA 1.5 05/16/2021-increasing ill-defined lesion in segment 7, MRI-posttreatment change of the liver with heterogenous hypoenhancement adjacent to hepatic segment 7 ablation site suspicious for local recurrence January 2023-SBRT to segment 7 ablation cavity 08/08/2021-stable posttreatment changes in liver, segment 7 ablation cavity unchanged, no new metastatic disease, increasing pleural effusion 10/17/2021, CT-interval increase in left hepatic lobe  segment 3 liver lesion measuring up to 2 cm 11/17/2021-completed 60 Gray in 8 fractions to segment 3 liver lesion 01/02/2022-CTs-stable 03/17/2022-thoracentesis right pleural effusion 03/27/2022-CTs-increased size of hypoattenuating lesion at the deep margin of the previously treated lesion right hepatic lobe 04/05/2019-MRI with increased size of a heterogenous lesion at the deep margin of a previously treated right hepatic lobe lesion, small right pleural effusion 04/17/2022-5-FU and FUDR  and panitumumab  05/07/2022 FUDR  No. 1 at 100% plus 5-FU 07/10/2022 FUDR  No. 4 at 100% 07/24/2022 glycerin flush plus 5-FU plus panitumumab  08/21/2022 CTs-slight decrease conspicuity of enhancing portion at the deep margin of the right hepatic lobe treatment cavity, new subcentimeter hypodensity in the medial left hepatic lobe, decreased subcentimeter cystic lesion in the left hepatic lobe, stable right pleural effusion CEA elevated 01/01/2023 01/08/2023-CTs-increased hypoenhancing area at the deep margin of right hepatic lobe resection cavity SBRT to right liver lesion 01/29/2023 - 02/02/2023, 4500 cGy 03/19/2023: CTs-slight decrease in size of right hepatic lesion, increased right lower lobe pulmonary nodule 04/12/2023 PET-CT-intense hypermetabolic activity in the posterior right hepatic lobe, small focus of activity in the left hepatic lobe, intense hypermetabolic activity involving an enlarging right lower lobe nodule, other pulmonary nodules have less intense avidity and are stable 05/07/2023 - 06/15/2023 FOLFIRI, panitumumab , FUDR  07/08/2023 CTs: Decrease in right lower lobe nodule, stable hepatic lesions small left effusion, CEA 2.7, pleural thickening and interlobular septal thickening-possible lymphatic spread, treatment placed on hold February 2025 SBRT to lung metastasis 09/05/2023 increase in size of hepatic lesions, stable right upper lobe nodule, interlobular septal thickening-question lymphatic spread within the  right lung 09/24/2023 FOLFOX/panitumumab  10/08/2023 5-FU alone due to rash/fatigue 10/22/2023 FOLFOX/PMab with dose reduction 11/30/2023 CTs: Decrease in hepatic metastases, treatment changed to 5-FU plus PMAB 01/31/2024 CTs: Stable right pulmonary nodule and hepatic disease, increased nonspecific right lower lobe opacity 01/31/2024-5-FU/PMab continued   History of colon polyps Microcytic anemia January 2019 Sarcoidosis Coronary artery disease Port-A-Cath placement, interventional radiology, 03/04/2019 Thrombocytopenia, mild thrombocytopenia predating chemotherapy Oxaliplatin  neuropathy Dyspnea on exertion-CT chest 02/25/2020 negative for PE, infection, pneumonitis; scheduled for PFTs  03/10/2020, followed by pulmonary    Disposition: [name] has metastatic colon cancer.  He continue systemic therapy with 5-FU/panitumumab .  He had stable disease on a restaging evaluation at [facility] in August.  The CEA is stable.  He is scheduled for a restaging evaluation in November.  He is followed closely by Dr. Brenna.  I am available to see him as needed.  He will return for an office visit in 6 months.  Arley Hof, MD  03/21/2024  8:19 AM

## 2024-03-24 DIAGNOSIS — M6281 Muscle weakness (generalized): Secondary | ICD-10-CM | POA: Diagnosis not present

## 2024-03-24 DIAGNOSIS — M48061 Spinal stenosis, lumbar region without neurogenic claudication: Secondary | ICD-10-CM | POA: Diagnosis not present

## 2024-03-26 DIAGNOSIS — M6281 Muscle weakness (generalized): Secondary | ICD-10-CM | POA: Diagnosis not present

## 2024-03-26 DIAGNOSIS — M48061 Spinal stenosis, lumbar region without neurogenic claudication: Secondary | ICD-10-CM | POA: Diagnosis not present

## 2024-03-27 DIAGNOSIS — Z5111 Encounter for antineoplastic chemotherapy: Secondary | ICD-10-CM | POA: Diagnosis not present

## 2024-03-27 DIAGNOSIS — E039 Hypothyroidism, unspecified: Secondary | ICD-10-CM | POA: Diagnosis not present

## 2024-03-27 DIAGNOSIS — Z79890 Hormone replacement therapy (postmenopausal): Secondary | ICD-10-CM | POA: Diagnosis not present

## 2024-03-27 DIAGNOSIS — K219 Gastro-esophageal reflux disease without esophagitis: Secondary | ICD-10-CM | POA: Diagnosis not present

## 2024-03-27 DIAGNOSIS — C787 Secondary malignant neoplasm of liver and intrahepatic bile duct: Secondary | ICD-10-CM | POA: Diagnosis not present

## 2024-03-27 DIAGNOSIS — Z5112 Encounter for antineoplastic immunotherapy: Secondary | ICD-10-CM | POA: Diagnosis not present

## 2024-03-27 DIAGNOSIS — J90 Pleural effusion, not elsewhere classified: Secondary | ICD-10-CM | POA: Diagnosis not present

## 2024-03-27 DIAGNOSIS — J449 Chronic obstructive pulmonary disease, unspecified: Secondary | ICD-10-CM | POA: Diagnosis not present

## 2024-03-27 DIAGNOSIS — C180 Malignant neoplasm of cecum: Secondary | ICD-10-CM | POA: Diagnosis not present

## 2024-03-27 DIAGNOSIS — C189 Malignant neoplasm of colon, unspecified: Secondary | ICD-10-CM | POA: Diagnosis not present

## 2024-03-31 ENCOUNTER — Other Ambulatory Visit: Payer: Self-pay

## 2024-03-31 ENCOUNTER — Encounter (INDEPENDENT_AMBULATORY_CARE_PROVIDER_SITE_OTHER): Payer: Self-pay

## 2024-03-31 ENCOUNTER — Other Ambulatory Visit: Payer: Self-pay | Admitting: Pulmonary Disease

## 2024-03-31 ENCOUNTER — Other Ambulatory Visit (HOSPITAL_COMMUNITY): Payer: Self-pay

## 2024-03-31 DIAGNOSIS — J4489 Other specified chronic obstructive pulmonary disease: Secondary | ICD-10-CM

## 2024-03-31 MED ORDER — DUPIXENT 300 MG/2ML ~~LOC~~ SOAJ
300.0000 mg | SUBCUTANEOUS | 5 refills | Status: DC
  Filled 2024-03-31 (×2): qty 4, 28d supply, fill #0

## 2024-03-31 NOTE — Progress Notes (Signed)
 Specialty Pharmacy Refill Coordination Note  Luis [name] is a 83 y.o. male contacted today regarding refills of specialty medication(s) Dupilumab  (Dupixent )   Patient requested Delivery   Delivery date: 04/02/24   Verified address: [address], [place] [address]   Medication will be filled on 10.21.25.

## 2024-03-31 NOTE — Telephone Encounter (Signed)
 Refill sent for DUPIXENT  to [facility]: [phone]   Dose: 300mg   every 14 days   Last OV: 01/02/24 Provider: Dr. Annella  Next OV: 04/03/24  Aleck Puls, PharmD, BCPS Clinical Pharmacist  [facility]

## 2024-04-01 ENCOUNTER — Other Ambulatory Visit: Payer: Self-pay

## 2024-04-03 ENCOUNTER — Ambulatory Visit: Admitting: Primary Care

## 2024-04-03 ENCOUNTER — Encounter: Payer: Self-pay | Admitting: Primary Care

## 2024-04-03 VITALS — BP 147/66 | HR 73 | Temp 97.9°F | Ht 71.0 in | Wt 219.6 lb

## 2024-04-03 DIAGNOSIS — D860 Sarcoidosis of lung: Secondary | ICD-10-CM

## 2024-04-03 DIAGNOSIS — Z87891 Personal history of nicotine dependence: Secondary | ICD-10-CM

## 2024-04-03 DIAGNOSIS — J22 Unspecified acute lower respiratory infection: Secondary | ICD-10-CM | POA: Diagnosis not present

## 2024-04-03 DIAGNOSIS — J441 Chronic obstructive pulmonary disease with (acute) exacerbation: Secondary | ICD-10-CM

## 2024-04-03 DIAGNOSIS — J4489 Other specified chronic obstructive pulmonary disease: Secondary | ICD-10-CM

## 2024-04-03 DIAGNOSIS — D869 Sarcoidosis, unspecified: Secondary | ICD-10-CM

## 2024-04-03 MED ORDER — AMOXICILLIN-POT CLAVULANATE 875-125 MG PO TABS: 1.0000 | ORAL_TABLET | Freq: Two times a day (BID) | ORAL | 0 refills | Status: DC

## 2024-04-03 MED ORDER — PREDNISONE 5 MG PO TABS: ORAL_TABLET | ORAL | 1 refills | Status: DC

## 2024-04-03 MED ORDER — BREZTRI AEROSPHERE 160-9-4.8 MCG/ACT IN AERO: 2.0000 | INHALATION_SPRAY | Freq: Two times a day (BID) | RESPIRATORY_TRACT | 5 refills | Status: DC

## 2024-04-03 NOTE — Patient Instructions (Addendum)
  VISIT SUMMARY: You visited today due to a respiratory infection that has worsened your breathing difficulties. We discussed your history of COPD asthma overlap, sarcoidosis, and recent exacerbations. We reviewed your current medications and made adjustments to help manage your symptoms.  YOUR PLAN: -COPD AND ASTHMA OVERLAP SYNDROME: This condition involves chronic inflammation and narrowing of the airways, leading to breathing difficulties. We will taper your prednisone  to 15 mg daily after your current regimen of 30 mg, then 20 mg, then 15 mg. Continue your Dupixent  injections every two weeks and refill your Breztri  inhaler. Please discuss your prednisone  regimen with Dr. Annella.  -ACUTE LOWER RESPIRATORY INFECTION (BRONCHITIS): Bronchitis is an infection of the lower respiratory tract that causes coughing and mucus production. We have prescribed Augmentin  to treat the bronchitis. Please contact your oncologist to discuss holding minocycline while you are on Augmentin .  -PULMONARY SARCOIDOSIS: Sarcoidosis is a condition where inflammatory cells grow in different parts of your body, including your lungs. We will continue to monitor your condition with annual CT scans or MRIs to check for any changes.  INSTRUCTIONS: Please follow up with your cardiologist as scheduled next week. Discuss your prednisone  regimen with Dr. Annella. Contact your oncologist to discuss holding minocycline while you are on Augmentin .  Follow-up: 2-3 months with Dr. Annella

## 2024-04-03 NOTE — Progress Notes (Signed)
 @Patient  ID: Luis [name], male    DOB: Oct 04, 1940, 83 y.o.   MRN: [id number]  Chief Complaint  Patient presents with   Asthma    Referring provider: [name], MD  HPI: 83 year old male, former smoker. PMH significant for COPD/asthma overlap, sarcoidosis, HTN, CAD, heart failure, colon cancer, obesity.   04/03/2024 Discussed the use of AI scribe software for clinical note transcription with the patient, who gave verbal consent to proceed.  History of Present Illness Luis [name] is an 83 year old male with COPD asthma overlap and sarcoidosis who presents with a respiratory infection exacerbating his breathing.  He developed a respiratory infection last week, initially affecting the upper respiratory tract before progressing to his chest. This infection, described as a cold, has worsened his breathing difficulties.  He has a history of COPD asthma overlap with recurrent exacerbations, notably in May and September. He typically manages these by adjusting his prednisone  dosage. Recently, he increased his prednisone  from one tablet a day to four tablets a day (40 mg) over a few days to manage symptoms, including cough and shortness of breath. The cough was initially productive but has become less so, with mucus now almost clear.  He has been on Dupixent  for the past two to three months, administered every two weeks, and missed a dose recently, which he believes may have contributed to his current respiratory issues. He also takes minocycline for a rash caused by chemotherapy, which he will continue as long as he is on chemotherapy.  He has a history of sarcoidosis, which previously required high doses of prednisone , and experienced similar respiratory symptoms when tapering prednisone  in the past. A sore throat often preceded congestion during past exacerbations.  He also has a history of atrial fibrillation, for which he underwent cardioversion, noting significant  improvement in shortness of breath post-procedure. He is scheduled to see his cardiologist in about a week.   Allergies  Allergen Reactions   Losartan  Potassium Other (See Comments)    Hyperkalemia    Immunization History  Administered Date(s) Administered   DTaP / IPV 09/25/2012   Fluad Quad(high Dose 65+) 02/19/2019, 03/14/2022, 03/04/2024   INFLUENZA, HIGH DOSE SEASONAL PF 03/27/2014, 01/27/2017, 02/22/2018, 03/12/2021   Influenza Split 03/27/2014   Influenza-Unspecified 02/24/2017, 03/10/2020   PFIZER Comirnaty(Gray Top)Covid-19 Tri-Sucrose Vaccine 07/21/2019   PFIZER(Purple Top)SARS-COV-2 Vaccination 07/11/2019, 07/21/2019, 01/26/2020, 10/26/2020   Pneumococcal Conjugate-13 03/27/2014   Pneumococcal Polysaccharide-23 11/10/2009   Pneumococcal-Unspecified 03/12/2014   Tdap 03/24/2011, 09/25/2012, 05/28/2021   Zoster Recombinant(Shingrix) 09/01/2018, 02/19/2019   Zoster, Live 03/21/2010    Past Medical History:  Diagnosis Date   Adenocarcinoma of cecum (HCC) 06/21/2017   Arthritis    mid back; hands; knees (02/24/2015)   Asthmatic bronchitis with acute exacerbation 06/27/2021   Basal cell carcinoma    left shoulder; mid chest; right eyelid (02/24/2015)   CAD (coronary artery disease)    a. 08/2014 Cath/PCI: LM nl, LAD 30p, D1 95 (2.25x12 Resolute Integrity DES), LCX small, RI 100 (attempted PCI) - branches fill via L->L collats, RCA dominant, nl, RPDA/PLA nl, EF 60^. b. 02/24/2015 PCI CTO of Ramus DES x2.   Chronic bronchitis (HCC)    hx   Diverticulosis 05/2005   Elevated lipase    Fuchs' corneal dystrophy    History of adenomatous polyp of colon 05/2005   8 mm adenoma   History of blood transfusion    related to some of my surgeries   Hyperlipidemia  Hypertension    Iron deficiency anemia due to chronic blood loss 06/21/2017   Pneumonia    Prostate cancer (HCC) 2011   S/P seed implant   Sarcoidosis    Thrombocytopenia    a. Noted on prior labs, unclear of  what w/u done.   Trifascicular block     Tobacco History: Social History   Tobacco Use  Smoking Status Former   Types: Cigars  Smokeless Tobacco Never  Tobacco Comments   Has stopped smoking and is no longer smoking cigars as of 2024.    Counseling given: Not Answered Tobacco comments: Has stopped smoking and is no longer smoking cigars as of 2024.    Outpatient Medications Prior to Visit  Medication Sig Dispense Refill   albuterol  (PROVENTIL ) (2.5 MG/3ML) 0.083% nebulizer solution INHALE 3 ML BY NEBULIZATION EVERY 6 HOURS AS NEEDED FOR WHEEZING OR SHORTNESS OF BREATH 75 mL 5   albuterol  (VENTOLIN  HFA) 108 (90 Base) MCG/ACT inhaler INHALE 1-2 PUFFS BY MOUTH EVERY 6 HOURS AS NEEDED FOR WHEEZE OR SHORTNESS OF BREATH 18 each 3   apixaban (ELIQUIS) 5 MG TABS tablet Take 5 mg by mouth 2 (two) times daily.     BREZTRI  AEROSPHERE 160-9-4.8 MCG/ACT AERO inhaler INHALE 2 PUFFS INTO THE LUNGS IN THE MORNING AND AT BEDTIME. RINSE MOUTH AFTER USE. 10.7 each 5   cetirizine  (ZYRTEC ) 10 MG tablet Take 1 tablet (10 mg total) by mouth daily. (Patient taking differently: Take 10 mg by mouth daily as needed for allergies.) 90 tablet 11   Cholecalciferol  (VITAMIN D3) 2000 units capsule Take 2,000 Units by mouth daily.     dronedarone  (MULTAQ ) 400 MG tablet Take 1 tablet (400 mg total) by mouth 2 (two) times daily with a meal. 180 tablet 1   Dupilumab  (DUPIXENT ) 300 MG/2ML SOAJ Inject 300 mg into the skin every 14 (fourteen) days. 4 mL 5   ezetimibe  (ZETIA ) 10 MG tablet TAKE 1 TABLET BY MOUTH EVERY DAY 90 tablet 3   fluticasone  (FLONASE ) 50 MCG/ACT nasal spray Place 2 sprays into both nostrils daily. 48 mL 3   furosemide  (LASIX ) 20 MG tablet Take 1 tablet (20 mg total) by mouth daily as needed for fluid or edema. (Patient taking differently: Take 20 mg by mouth daily.) 90 tablet 3   gabapentin  (NEURONTIN ) 100 MG capsule Take 200 mg by mouth at bedtime.     guaiFENesin -dextromethorphan  (ROBITUSSIN DM)  100-10 MG/5ML syrup Take 10 mLs by mouth every 6 (six) hours as needed for cough.     ibuprofen (ADVIL) 200 MG tablet Take 400 mg by mouth every 6 (six) hours as needed for moderate pain (pain score 4-6) or mild pain (pain score 1-3).     levothyroxine  (SYNTHROID ) 50 MCG tablet Take 50 mcg by mouth daily before breakfast.     Magnesium  400 MG TABS Take 400 mg by mouth in the morning and at bedtime.     metroNIDAZOLE (METROCREAM) 0.75 % cream Apply 1 Application topically 2 (two) times daily as needed (rash).     minocycline (MINOCIN) 100 MG capsule Take 100 mg by mouth daily.     montelukast  (SINGULAIR ) 10 MG tablet Take 1 tablet (10 mg total) by mouth at bedtime. 30 tablet 11   Multiple Vitamins-Minerals (VITRUM SENIOR) TABS Take 1 tablet by mouth daily.     Naphazoline-Pheniramine ([employer] EYE ALLERGY RELIEF) 0.027-0.315 % SOLN Place 1 drop into both eyes 2 (two) times daily.     naproxen  sodium (ALEVE ) 220 MG  tablet Take 440 mg by mouth 2 (two) times daily as needed (pain).     nitroGLYCERIN  (NITROSTAT ) 0.4 MG SL tablet PLACE 1 TABLET UNDER THE TONGUE EVERY 5 MINUTES AS NEEDED FOR CHEST PAIN FOR 3 DOSES 25 tablet 11   predniSONE  (DELTASONE ) 10 MG tablet Take 2 tablets (20 mg total) by mouth daily with breakfast for 7 days, THEN 1 tablet (10 mg total) daily with breakfast. 104 tablet 0   ranolazine  (RANEXA ) 500 MG 12 hr tablet TAKE 1 TABLET BY MOUTH TWICE A DAY 180 tablet 3   rosuvastatin  (CRESTOR ) 20 MG tablet TAKE 1 TABLET BY MOUTH EVERY DAY 90 tablet 3   traMADol  (ULTRAM ) 50 MG tablet Take 2 tablets (100 mg total) by mouth every 6 (six) hours as needed. 10 tablet 0   No facility-administered medications prior to visit.   Review of Systems  Review of Systems  Respiratory:  Positive for cough.     Physical Exam  BP (!) 147/66   Pulse 73   Temp 97.9 F (36.6 C)   Ht 5' 11 (1.803 m)   Wt 219 lb 9.6 oz (99.6 kg)   SpO2 92% Comment: ra  BMI 30.63 kg/m  Physical Exam Constitutional:       Appearance: Normal appearance. He is well-developed.  HENT:     Head: Normocephalic and atraumatic.     Mouth/Throat:     Mouth: Mucous membranes are moist.     Pharynx: Oropharynx is clear.  Cardiovascular:     Rate and Rhythm: Normal rate and regular rhythm.     Heart sounds: Normal heart sounds.  Pulmonary:     Effort: Pulmonary effort is normal. No respiratory distress.     Breath sounds: Normal breath sounds. No wheezing or rhonchi.  Musculoskeletal:        General: Normal range of motion.     Cervical back: Normal range of motion and neck supple.  Skin:    General: Skin is warm and dry.     Findings: No erythema or rash.  Neurological:     General: No focal deficit present.     Mental Status: He is alert and oriented to person, place, and time. Mental status is at baseline.  Psychiatric:        Mood and Affect: Mood normal.        Behavior: Behavior normal.        Thought Content: Thought content normal.        Judgment: Judgment normal.     Lab Results:  CBC    Component Value Date/Time   WBC 3.3 (L) 01/21/2024 1316   WBC 11.9 (H) 08/26/2023 0500   RBC 3.21 (L) 01/21/2024 1316   RBC 3.61 (L) 08/26/2023 0500   HGB 10.7 (L) 01/21/2024 1316   HCT 33.6 (L) 01/21/2024 1316   PLT 96 (LL) 01/21/2024 1316   MCV 105 (H) 01/21/2024 1316   MCH 33.3 (H) 01/21/2024 1316   MCH 32.7 08/26/2023 0500   MCHC 31.8 01/21/2024 1316   MCHC 33.4 08/26/2023 0500   RDW 18.5 (H) 01/21/2024 1316   LYMPHSABS 0.1 (L) 08/25/2023 1227   MONOABS 0.6 08/25/2023 1227   EOSABS 0.0 08/25/2023 1227   BASOSABS 0.0 08/25/2023 1227    BMET    Component Value Date/Time   NA 138 01/21/2024 1316   K 5.6 (H) 01/21/2024 1316   CL 103 01/21/2024 1316   CO2 23 01/21/2024 1316   GLUCOSE 224 (H) 01/21/2024 1316  GLUCOSE 111 (H) 01/02/2024 1445   BUN 12 01/21/2024 1316   CREATININE 0.70 (L) 01/21/2024 1316   CREATININE 0.83 06/07/2020 1119   CREATININE 1.02 05/16/2016 0937   CALCIUM  8.5  (L) 01/21/2024 1316   GFRNONAA 56 (L) 08/26/2023 0500   GFRNONAA >60 06/07/2020 1119   GFRAA >60 03/10/2020 0855    BNP    Component Value Date/Time   BNP 179.7 (H) 08/24/2023 1818   BNP 43.4 05/16/2016 0937    ProBNP    Component Value Date/Time   PROBNP 221.0 (H) 01/02/2024 1445    Imaging: No results found.   Assessment & Plan:    Assessment & Plan COPD and asthma overlap syndrome with recurrent exacerbations Recurrent exacerbations following a respiratory infection. Difficulty tapering prednisone  from 20 mg to 10 mg due to worsening symptoms including cough and shortness of breath. Current regimen includes prednisone  and Dupixent . Risks of long-term prednisone  use include osteoporosis, drug-induced diabetes, and decreased immune function. - Taper prednisone  to 15 mg daily after current regimen - Continue Dupixent  injections every two weeks. - Refill Breztri  inhaler.  Acute lower respiratory infection (bronchitis) Acute lower respiratory infection following a recent cold. Symptoms include cough, initially productive with almost clear mucus, now non-productive. No current antibiotic use for the infection, but on minocycline for a rash related to chemotherapy. - Prescribe Augmentin  1 tablet daily x 7 days - Contact oncologist to discuss holding minocycline while on Augmentin .  Pulmonary sarcoidosis Pulmonary sarcoidosis with well-managed chronic changes. Previous CT scan in June showed resolution of left lower lobe pneumonia and sarcoidosis-related changes. Multivessel coronary artery calcifications and pulmonary hypertension noted. - Continue monitoring  Luis LELON Ferrari, NP 04/03/2024

## 2024-04-07 ENCOUNTER — Emergency Department (HOSPITAL_COMMUNITY)

## 2024-04-07 ENCOUNTER — Ambulatory Visit (HOSPITAL_COMMUNITY): Admission: RE | Admit: 2024-04-07 | Source: Ambulatory Visit

## 2024-04-07 ENCOUNTER — Encounter (HOSPITAL_COMMUNITY): Payer: Self-pay

## 2024-04-07 ENCOUNTER — Telehealth: Payer: Self-pay | Admitting: Cardiology

## 2024-04-07 ENCOUNTER — Inpatient Hospital Stay (HOSPITAL_COMMUNITY)

## 2024-04-07 ENCOUNTER — Other Ambulatory Visit: Payer: Self-pay

## 2024-04-07 ENCOUNTER — Inpatient Hospital Stay (HOSPITAL_COMMUNITY)
Admission: EM | Admit: 2024-04-07 | Discharge: 2024-05-12 | DRG: 193 | Disposition: E | Attending: Family Medicine | Admitting: Family Medicine

## 2024-04-07 DIAGNOSIS — D649 Anemia, unspecified: Secondary | ICD-10-CM | POA: Diagnosis not present

## 2024-04-07 DIAGNOSIS — J441 Chronic obstructive pulmonary disease with (acute) exacerbation: Secondary | ICD-10-CM | POA: Diagnosis present

## 2024-04-07 DIAGNOSIS — Z79890 Hormone replacement therapy (postmenopausal): Secondary | ICD-10-CM

## 2024-04-07 DIAGNOSIS — Z1152 Encounter for screening for COVID-19: Secondary | ICD-10-CM | POA: Diagnosis not present

## 2024-04-07 DIAGNOSIS — E872 Acidosis, unspecified: Secondary | ICD-10-CM | POA: Diagnosis present

## 2024-04-07 DIAGNOSIS — Z515 Encounter for palliative care: Secondary | ICD-10-CM

## 2024-04-07 DIAGNOSIS — Z6826 Body mass index (BMI) 26.0-26.9, adult: Secondary | ICD-10-CM

## 2024-04-07 DIAGNOSIS — J9621 Acute and chronic respiratory failure with hypoxia: Secondary | ICD-10-CM | POA: Diagnosis present

## 2024-04-07 DIAGNOSIS — E66811 Obesity, class 1: Secondary | ICD-10-CM | POA: Diagnosis present

## 2024-04-07 DIAGNOSIS — J188 Other pneumonia, unspecified organism: Secondary | ICD-10-CM | POA: Diagnosis not present

## 2024-04-07 DIAGNOSIS — K649 Unspecified hemorrhoids: Secondary | ICD-10-CM | POA: Diagnosis not present

## 2024-04-07 DIAGNOSIS — J982 Interstitial emphysema: Secondary | ICD-10-CM | POA: Diagnosis not present

## 2024-04-07 DIAGNOSIS — E869 Volume depletion, unspecified: Secondary | ICD-10-CM | POA: Diagnosis present

## 2024-04-07 DIAGNOSIS — T797XXA Traumatic subcutaneous emphysema, initial encounter: Secondary | ICD-10-CM | POA: Diagnosis not present

## 2024-04-07 DIAGNOSIS — R0602 Shortness of breath: Secondary | ICD-10-CM | POA: Diagnosis not present

## 2024-04-07 DIAGNOSIS — E1165 Type 2 diabetes mellitus with hyperglycemia: Secondary | ICD-10-CM | POA: Diagnosis present

## 2024-04-07 DIAGNOSIS — Z87891 Personal history of nicotine dependence: Secondary | ICD-10-CM

## 2024-04-07 DIAGNOSIS — I5033 Acute on chronic diastolic (congestive) heart failure: Secondary | ICD-10-CM | POA: Diagnosis present

## 2024-04-07 DIAGNOSIS — R0609 Other forms of dyspnea: Secondary | ICD-10-CM | POA: Diagnosis not present

## 2024-04-07 DIAGNOSIS — E8770 Fluid overload, unspecified: Secondary | ICD-10-CM | POA: Diagnosis not present

## 2024-04-07 DIAGNOSIS — Z9049 Acquired absence of other specified parts of digestive tract: Secondary | ICD-10-CM

## 2024-04-07 DIAGNOSIS — D849 Immunodeficiency, unspecified: Secondary | ICD-10-CM | POA: Diagnosis present

## 2024-04-07 DIAGNOSIS — I5031 Acute diastolic (congestive) heart failure: Secondary | ICD-10-CM

## 2024-04-07 DIAGNOSIS — K5730 Diverticulosis of large intestine without perforation or abscess without bleeding: Secondary | ICD-10-CM | POA: Diagnosis not present

## 2024-04-07 DIAGNOSIS — Z7901 Long term (current) use of anticoagulants: Secondary | ICD-10-CM

## 2024-04-07 DIAGNOSIS — I4892 Unspecified atrial flutter: Secondary | ICD-10-CM | POA: Diagnosis not present

## 2024-04-07 DIAGNOSIS — J948 Other specified pleural conditions: Secondary | ICD-10-CM | POA: Diagnosis present

## 2024-04-07 DIAGNOSIS — I700 Atherosclerosis of aorta: Secondary | ICD-10-CM | POA: Diagnosis not present

## 2024-04-07 DIAGNOSIS — I480 Paroxysmal atrial fibrillation: Secondary | ICD-10-CM | POA: Diagnosis present

## 2024-04-07 DIAGNOSIS — K921 Melena: Secondary | ICD-10-CM | POA: Diagnosis not present

## 2024-04-07 DIAGNOSIS — J984 Other disorders of lung: Secondary | ICD-10-CM | POA: Diagnosis present

## 2024-04-07 DIAGNOSIS — Z85038 Personal history of other malignant neoplasm of large intestine: Secondary | ICD-10-CM | POA: Diagnosis not present

## 2024-04-07 DIAGNOSIS — I2510 Atherosclerotic heart disease of native coronary artery without angina pectoris: Secondary | ICD-10-CM | POA: Diagnosis not present

## 2024-04-07 DIAGNOSIS — Z9221 Personal history of antineoplastic chemotherapy: Secondary | ICD-10-CM

## 2024-04-07 DIAGNOSIS — I110 Hypertensive heart disease with heart failure: Secondary | ICD-10-CM | POA: Diagnosis present

## 2024-04-07 DIAGNOSIS — L719 Rosacea, unspecified: Secondary | ICD-10-CM | POA: Diagnosis present

## 2024-04-07 DIAGNOSIS — D1771 Benign lipomatous neoplasm of kidney: Secondary | ICD-10-CM | POA: Diagnosis not present

## 2024-04-07 DIAGNOSIS — J9601 Acute respiratory failure with hypoxia: Secondary | ICD-10-CM

## 2024-04-07 DIAGNOSIS — R54 Age-related physical debility: Secondary | ICD-10-CM | POA: Diagnosis present

## 2024-04-07 DIAGNOSIS — Z86718 Personal history of other venous thrombosis and embolism: Secondary | ICD-10-CM

## 2024-04-07 DIAGNOSIS — I82431 Acute embolism and thrombosis of right popliteal vein: Secondary | ICD-10-CM | POA: Diagnosis present

## 2024-04-07 DIAGNOSIS — I2489 Other forms of acute ischemic heart disease: Secondary | ICD-10-CM | POA: Diagnosis present

## 2024-04-07 DIAGNOSIS — G8929 Other chronic pain: Secondary | ICD-10-CM | POA: Diagnosis present

## 2024-04-07 DIAGNOSIS — E876 Hypokalemia: Secondary | ICD-10-CM | POA: Diagnosis not present

## 2024-04-07 DIAGNOSIS — E8809 Other disorders of plasma-protein metabolism, not elsewhere classified: Secondary | ICD-10-CM | POA: Diagnosis not present

## 2024-04-07 DIAGNOSIS — I440 Atrioventricular block, first degree: Secondary | ICD-10-CM | POA: Diagnosis present

## 2024-04-07 DIAGNOSIS — J939 Pneumothorax, unspecified: Secondary | ICD-10-CM | POA: Diagnosis present

## 2024-04-07 DIAGNOSIS — I452 Bifascicular block: Secondary | ICD-10-CM | POA: Diagnosis present

## 2024-04-07 DIAGNOSIS — R627 Adult failure to thrive: Secondary | ICD-10-CM | POA: Diagnosis present

## 2024-04-07 DIAGNOSIS — C7800 Secondary malignant neoplasm of unspecified lung: Secondary | ICD-10-CM | POA: Diagnosis present

## 2024-04-07 DIAGNOSIS — I2582 Chronic total occlusion of coronary artery: Secondary | ICD-10-CM | POA: Diagnosis present

## 2024-04-07 DIAGNOSIS — C189 Malignant neoplasm of colon, unspecified: Secondary | ICD-10-CM | POA: Diagnosis present

## 2024-04-07 DIAGNOSIS — J90 Pleural effusion, not elsewhere classified: Secondary | ICD-10-CM | POA: Diagnosis present

## 2024-04-07 DIAGNOSIS — H5702 Anisocoria: Secondary | ICD-10-CM | POA: Diagnosis present

## 2024-04-07 DIAGNOSIS — I517 Cardiomegaly: Secondary | ICD-10-CM | POA: Diagnosis not present

## 2024-04-07 DIAGNOSIS — Z955 Presence of coronary angioplasty implant and graft: Secondary | ICD-10-CM

## 2024-04-07 DIAGNOSIS — Z8249 Family history of ischemic heart disease and other diseases of the circulatory system: Secondary | ICD-10-CM

## 2024-04-07 DIAGNOSIS — E7800 Pure hypercholesterolemia, unspecified: Secondary | ICD-10-CM | POA: Diagnosis not present

## 2024-04-07 DIAGNOSIS — I10 Essential (primary) hypertension: Secondary | ICD-10-CM | POA: Diagnosis not present

## 2024-04-07 DIAGNOSIS — I82441 Acute embolism and thrombosis of right tibial vein: Secondary | ICD-10-CM | POA: Diagnosis present

## 2024-04-07 DIAGNOSIS — Z7189 Other specified counseling: Secondary | ICD-10-CM | POA: Diagnosis not present

## 2024-04-07 DIAGNOSIS — Z452 Encounter for adjustment and management of vascular access device: Secondary | ICD-10-CM | POA: Diagnosis not present

## 2024-04-07 DIAGNOSIS — R531 Weakness: Secondary | ICD-10-CM | POA: Diagnosis not present

## 2024-04-07 DIAGNOSIS — N179 Acute kidney failure, unspecified: Secondary | ICD-10-CM | POA: Diagnosis not present

## 2024-04-07 DIAGNOSIS — J9691 Respiratory failure, unspecified with hypoxia: Secondary | ICD-10-CM | POA: Diagnosis not present

## 2024-04-07 DIAGNOSIS — I951 Orthostatic hypotension: Secondary | ICD-10-CM | POA: Diagnosis not present

## 2024-04-07 DIAGNOSIS — R918 Other nonspecific abnormal finding of lung field: Secondary | ICD-10-CM | POA: Diagnosis not present

## 2024-04-07 DIAGNOSIS — R609 Edema, unspecified: Secondary | ICD-10-CM | POA: Diagnosis not present

## 2024-04-07 DIAGNOSIS — Z66 Do not resuscitate: Secondary | ICD-10-CM | POA: Diagnosis not present

## 2024-04-07 DIAGNOSIS — I2583 Coronary atherosclerosis due to lipid rich plaque: Secondary | ICD-10-CM | POA: Diagnosis not present

## 2024-04-07 DIAGNOSIS — G9341 Metabolic encephalopathy: Secondary | ICD-10-CM | POA: Diagnosis present

## 2024-04-07 DIAGNOSIS — R1310 Dysphagia, unspecified: Secondary | ICD-10-CM | POA: Diagnosis present

## 2024-04-07 DIAGNOSIS — K625 Hemorrhage of anus and rectum: Secondary | ICD-10-CM | POA: Diagnosis not present

## 2024-04-07 DIAGNOSIS — Z85828 Personal history of other malignant neoplasm of skin: Secondary | ICD-10-CM

## 2024-04-07 DIAGNOSIS — R7401 Elevation of levels of liver transaminase levels: Secondary | ICD-10-CM | POA: Diagnosis not present

## 2024-04-07 DIAGNOSIS — E1169 Type 2 diabetes mellitus with other specified complication: Secondary | ICD-10-CM | POA: Diagnosis not present

## 2024-04-07 DIAGNOSIS — D869 Sarcoidosis, unspecified: Secondary | ICD-10-CM | POA: Diagnosis not present

## 2024-04-07 DIAGNOSIS — C787 Secondary malignant neoplasm of liver and intrahepatic bile duct: Secondary | ICD-10-CM | POA: Diagnosis present

## 2024-04-07 DIAGNOSIS — E039 Hypothyroidism, unspecified: Secondary | ICD-10-CM | POA: Diagnosis present

## 2024-04-07 DIAGNOSIS — J849 Interstitial pulmonary disease, unspecified: Secondary | ICD-10-CM | POA: Diagnosis not present

## 2024-04-07 DIAGNOSIS — D696 Thrombocytopenia, unspecified: Secondary | ICD-10-CM | POA: Diagnosis present

## 2024-04-07 DIAGNOSIS — K7460 Unspecified cirrhosis of liver: Secondary | ICD-10-CM | POA: Diagnosis present

## 2024-04-07 DIAGNOSIS — Z860101 Personal history of adenomatous and serrated colon polyps: Secondary | ICD-10-CM

## 2024-04-07 DIAGNOSIS — Z947 Corneal transplant status: Secondary | ICD-10-CM

## 2024-04-07 DIAGNOSIS — I5A Non-ischemic myocardial injury (non-traumatic): Secondary | ICD-10-CM | POA: Diagnosis present

## 2024-04-07 DIAGNOSIS — J189 Pneumonia, unspecified organism: Principal | ICD-10-CM | POA: Diagnosis present

## 2024-04-07 DIAGNOSIS — E7849 Other hyperlipidemia: Secondary | ICD-10-CM | POA: Diagnosis present

## 2024-04-07 DIAGNOSIS — Z923 Personal history of irradiation: Secondary | ICD-10-CM

## 2024-04-07 DIAGNOSIS — M7989 Other specified soft tissue disorders: Secondary | ICD-10-CM | POA: Diagnosis not present

## 2024-04-07 DIAGNOSIS — R0603 Acute respiratory distress: Secondary | ICD-10-CM | POA: Diagnosis not present

## 2024-04-07 DIAGNOSIS — R4182 Altered mental status, unspecified: Secondary | ICD-10-CM | POA: Diagnosis not present

## 2024-04-07 DIAGNOSIS — K648 Other hemorrhoids: Secondary | ICD-10-CM | POA: Diagnosis not present

## 2024-04-07 DIAGNOSIS — J440 Chronic obstructive pulmonary disease with acute lower respiratory infection: Secondary | ICD-10-CM | POA: Diagnosis present

## 2024-04-07 DIAGNOSIS — R846 Abnormal cytological findings in specimens from respiratory organs and thorax: Secondary | ICD-10-CM | POA: Diagnosis not present

## 2024-04-07 DIAGNOSIS — Z4682 Encounter for fitting and adjustment of non-vascular catheter: Secondary | ICD-10-CM | POA: Diagnosis not present

## 2024-04-07 DIAGNOSIS — Z7952 Long term (current) use of systemic steroids: Secondary | ICD-10-CM

## 2024-04-07 DIAGNOSIS — I5030 Unspecified diastolic (congestive) heart failure: Secondary | ICD-10-CM | POA: Diagnosis not present

## 2024-04-07 DIAGNOSIS — Z79899 Other long term (current) drug therapy: Secondary | ICD-10-CM

## 2024-04-07 DIAGNOSIS — J45909 Unspecified asthma, uncomplicated: Secondary | ICD-10-CM | POA: Diagnosis not present

## 2024-04-07 DIAGNOSIS — I509 Heart failure, unspecified: Secondary | ICD-10-CM | POA: Diagnosis not present

## 2024-04-07 DIAGNOSIS — E875 Hyperkalemia: Secondary | ICD-10-CM | POA: Diagnosis present

## 2024-04-07 DIAGNOSIS — R0902 Hypoxemia: Secondary | ICD-10-CM | POA: Diagnosis not present

## 2024-04-07 DIAGNOSIS — Z8546 Personal history of malignant neoplasm of prostate: Secondary | ICD-10-CM

## 2024-04-07 DIAGNOSIS — J45901 Unspecified asthma with (acute) exacerbation: Secondary | ICD-10-CM | POA: Diagnosis present

## 2024-04-07 DIAGNOSIS — K769 Liver disease, unspecified: Secondary | ICD-10-CM | POA: Diagnosis not present

## 2024-04-07 DIAGNOSIS — D860 Sarcoidosis of lung: Secondary | ICD-10-CM | POA: Diagnosis present

## 2024-04-07 LAB — BASIC METABOLIC PANEL WITH GFR
Anion gap: 18 — ABNORMAL HIGH (ref 5–15)
Anion gap: 14 (ref 5–15)
BUN: 14 mg/dL (ref 8–23)
BUN: 13 mg/dL (ref 8–23)
CO2: 19 mmol/L — ABNORMAL LOW (ref 22–32)
CO2: 20 mmol/L — ABNORMAL LOW (ref 22–32)
Calcium: 8.5 mg/dL — ABNORMAL LOW (ref 8.9–10.3)
Calcium: 8.1 mg/dL — ABNORMAL LOW (ref 8.9–10.3)
Chloride: 98 mmol/L (ref 98–111)
Chloride: 99 mmol/L (ref 98–111)
Creatinine, Ser: 0.89 mg/dL (ref 0.61–1.24)
Creatinine, Ser: 0.79 mg/dL (ref 0.61–1.24)
GFR, Estimated: >60 mL/min (ref >60)
GFR, Estimated: >60 mL/min (ref >60)
Glucose, Bld: 262 mg/dL — ABNORMAL HIGH (ref 70–99)
Glucose, Bld: 291 mg/dL — ABNORMAL HIGH (ref 70–99)
Potassium: 5.5 mmol/L — ABNORMAL HIGH (ref 3.5–5.1)
Potassium: 4.2 mmol/L (ref 3.5–5.1)
Sodium: 135 mmol/L (ref 135–145)
Sodium: 133 mmol/L — ABNORMAL LOW (ref 135–145)

## 2024-04-07 LAB — CBC
HCT: 35.3 % — ABNORMAL LOW (ref 39.0–52.0)
HCT: 38.1 % — ABNORMAL LOW (ref 39.0–52.0)
Hemoglobin: 11.1 g/dL — ABNORMAL LOW (ref 13.0–17.0)
Hemoglobin: 11.9 g/dL — ABNORMAL LOW (ref 13.0–17.0)
MCH: 31.2 pg (ref 26.0–34.0)
MCH: 32 pg (ref 26.0–34.0)
MCHC: 31.2 g/dL (ref 30.0–36.0)
MCHC: 31.4 g/dL (ref 30.0–36.0)
MCV: 102.4 fL — ABNORMAL HIGH (ref 80.0–100.0)
MCV: 99.2 fL (ref 80.0–100.0)
Platelets: 125 K/uL — ABNORMAL LOW (ref 150–400)
Platelets: 144 K/uL — ABNORMAL LOW (ref 150–400)
RBC: 3.56 MIL/uL — ABNORMAL LOW (ref 4.22–5.81)
RBC: 3.72 MIL/uL — ABNORMAL LOW (ref 4.22–5.81)
RDW: 17.4 % — ABNORMAL HIGH (ref 11.5–15.5)
RDW: 17.6 % — ABNORMAL HIGH (ref 11.5–15.5)
WBC: 14.5 K/uL — ABNORMAL HIGH (ref 4.0–10.5)
WBC: 14.5 K/uL — ABNORMAL HIGH (ref 4.0–10.5)
nRBC: 0 % (ref 0.0–0.2)
nRBC: 0.1 % (ref 0.0–0.2)

## 2024-04-07 LAB — CBG MONITORING, ED
Glucose-Capillary: 337 mg/dL — ABNORMAL HIGH (ref 70–99)
Glucose-Capillary: 396 mg/dL — ABNORMAL HIGH (ref 70–99)

## 2024-04-07 LAB — BRAIN NATRIURETIC PEPTIDE: B Natriuretic Peptide: 249.3 pg/mL — ABNORMAL HIGH (ref 0.0–100.0)

## 2024-04-07 LAB — RESPIRATORY PANEL BY PCR

## 2024-04-07 LAB — RESP PANEL BY RT-PCR (RSV, FLU A&B, COVID)  RVPGX2
Influenza A by PCR: NEGATIVE
Influenza B by PCR: NEGATIVE
Resp Syncytial Virus by PCR: NEGATIVE
SARS Coronavirus 2 by RT PCR: NEGATIVE

## 2024-04-07 LAB — GLUCOSE, CAPILLARY
Glucose-Capillary: 302 mg/dL — ABNORMAL HIGH (ref 70–99)
Glucose-Capillary: 197 mg/dL — ABNORMAL HIGH (ref 70–99)

## 2024-04-07 LAB — BLOOD GAS, VENOUS
Acid-Base Excess: 0.5 mmol/L (ref 0.0–2.0)
Bicarbonate: 24.6 mmol/L (ref 20.0–28.0)
O2 Saturation: 86.2 %
Patient temperature: 37
pCO2, Ven: 37 mmHg — ABNORMAL LOW (ref 44–60)
pH, Ven: 7.43 (ref 7.25–7.43)
pO2, Ven: 54 mmHg — ABNORMAL HIGH (ref 32–45)

## 2024-04-07 LAB — HEMOGLOBIN A1C
Hgb A1c MFr Bld: 7.1 % — ABNORMAL HIGH (ref 4.8–5.6)
Mean Plasma Glucose: 157.07 mg/dL

## 2024-04-07 LAB — PROTIME-INR
INR: 1.3 — ABNORMAL HIGH (ref 0.8–1.2)
Prothrombin Time: 17.3 s — ABNORMAL HIGH (ref 11.4–15.2)

## 2024-04-07 LAB — LACTIC ACID, PLASMA
Lactic Acid, Venous: 3.3 mmol/L (ref 0.5–1.9)
Lactic Acid, Venous: 2.5 mmol/L (ref 0.5–1.9)

## 2024-04-07 LAB — TROPONIN I (HIGH SENSITIVITY)
Troponin I (High Sensitivity): 29 ng/L — ABNORMAL HIGH (ref <18)
Troponin I (High Sensitivity): 33 ng/L — ABNORMAL HIGH (ref <18)

## 2024-04-07 LAB — PHOSPHORUS: Phosphorus: 2.7 mg/dL (ref 2.5–4.6)

## 2024-04-07 LAB — MAGNESIUM: Magnesium: 1.5 mg/dL — ABNORMAL LOW (ref 1.7–2.4)

## 2024-04-07 LAB — FOLATE: Folate: 15.2 ng/mL (ref >5.9)

## 2024-04-07 LAB — PROCALCITONIN: Procalcitonin: 1.5 ng/mL

## 2024-04-07 LAB — VITAMIN B12: Vitamin B-12: 445 pg/mL (ref 180–914)

## 2024-04-07 MED ORDER — SODIUM ZIRCONIUM CYCLOSILICATE 10 G PO PACK
10.0000 g | PACK | Freq: Once | ORAL | Status: AC
  Administered 2024-04-07: 10 g via ORAL
  Filled 2024-04-07: qty 1

## 2024-04-07 MED ORDER — INSULIN ASPART 100 UNIT/ML IJ SOLN
0.0000 [IU] | Freq: Three times a day (TID) | INTRAMUSCULAR | Status: DC
  Administered 2024-04-07: 4 [IU] via SUBCUTANEOUS

## 2024-04-07 MED ORDER — BUDESON-GLYCOPYRROL-FORMOTEROL 160-9-4.8 MCG/ACT IN AERO
2.0000 | INHALATION_SPRAY | Freq: Two times a day (BID) | RESPIRATORY_TRACT | Status: DC
  Administered 2024-04-07 – 2024-05-09 (×66): 2 via RESPIRATORY_TRACT
  Filled 2024-04-07 (×5): qty 5.9

## 2024-04-07 MED ORDER — METHYLPREDNISOLONE SODIUM SUCC 125 MG IJ SOLR
125.0000 mg | Freq: Once | INTRAMUSCULAR | Status: AC
  Administered 2024-04-07: 125 mg via INTRAVENOUS
  Filled 2024-04-07: qty 2

## 2024-04-07 MED ORDER — APIXABAN 5 MG PO TABS
5.0000 mg | ORAL_TABLET | Freq: Two times a day (BID) | ORAL | Status: DC
  Administered 2024-04-07 – 2024-04-18 (×24): 5 mg via ORAL
  Filled 2024-04-07 (×7): qty 1
  Filled 2024-04-07: qty 2
  Filled 2024-04-07 (×11): qty 1
  Filled 2024-04-07: qty 2
  Filled 2024-04-07 (×4): qty 1

## 2024-04-07 MED ORDER — PREDNISONE 20 MG PO TABS: 40.0000 mg | ORAL_TABLET | Freq: Every day | ORAL | Status: DC

## 2024-04-07 MED ORDER — ALBUTEROL SULFATE (2.5 MG/3ML) 0.083% IN NEBU
2.5000 mg | INHALATION_SOLUTION | RESPIRATORY_TRACT | Status: DC | PRN
  Administered 2024-04-10 – 2024-04-24 (×13): 2.5 mg via RESPIRATORY_TRACT
  Filled 2024-04-07 (×16): qty 3

## 2024-04-07 MED ORDER — ONDANSETRON HCL 4 MG/2ML IJ SOLN
4.0000 mg | Freq: Four times a day (QID) | INTRAMUSCULAR | Status: DC | PRN
Start: 2024-04-07 — End: 2024-05-10
  Administered 2024-04-11: 4 mg via INTRAVENOUS

## 2024-04-07 MED ORDER — SODIUM CHLORIDE 0.9 % IV SOLN
1.0000 g | INTRAVENOUS | Status: AC
  Administered 2024-04-08 – 2024-04-10 (×4): 1 g via INTRAVENOUS
  Filled 2024-04-07 (×4): qty 10

## 2024-04-07 MED ORDER — IPRATROPIUM-ALBUTEROL 0.5-2.5 (3) MG/3ML IN SOLN
3.0000 mL | Freq: Four times a day (QID) | RESPIRATORY_TRACT | Status: DC
  Administered 2024-04-07 – 2024-04-09 (×10): 3 mL via RESPIRATORY_TRACT
  Filled 2024-04-07 (×10): qty 3

## 2024-04-07 MED ORDER — GABAPENTIN 100 MG PO CAPS
200.0000 mg | ORAL_CAPSULE | Freq: Every day | ORAL | Status: DC
  Administered 2024-04-07 – 2024-05-09 (×33): 200 mg via ORAL
  Filled 2024-04-07 (×33): qty 2

## 2024-04-07 MED ORDER — DRONEDARONE HCL 400 MG PO TABS
400.0000 mg | ORAL_TABLET | Freq: Two times a day (BID) | ORAL | Status: DC
  Administered 2024-04-07 – 2024-04-25 (×35): 400 mg via ORAL
  Filled 2024-04-07 (×39): qty 1

## 2024-04-07 MED ORDER — PREDNISONE 20 MG PO TABS
40.0000 mg | ORAL_TABLET | Freq: Every day | ORAL | Status: AC
  Administered 2024-04-07 – 2024-04-10 (×4): 40 mg via ORAL
  Filled 2024-04-07 (×4): qty 2

## 2024-04-07 MED ORDER — SODIUM CHLORIDE 0.9 % IV SOLN
2.0000 g | Freq: Once | INTRAVENOUS | Status: AC
  Administered 2024-04-07: 2 g via INTRAVENOUS
  Filled 2024-04-07: qty 20

## 2024-04-07 MED ORDER — MELATONIN 3 MG PO TABS
6.0000 mg | ORAL_TABLET | Freq: Every evening | ORAL | Status: DC | PRN
  Administered 2024-04-22 – 2024-05-03 (×9): 6 mg via ORAL
  Filled 2024-04-07 (×9): qty 2

## 2024-04-07 MED ORDER — ROSUVASTATIN CALCIUM 20 MG PO TABS
20.0000 mg | ORAL_TABLET | Freq: Every day | ORAL | Status: DC
  Administered 2024-04-07 – 2024-04-20 (×14): 20 mg via ORAL
  Filled 2024-04-07 (×14): qty 1

## 2024-04-07 MED ORDER — MINOCYCLINE HCL 50 MG PO CAPS
100.0000 mg | ORAL_CAPSULE | Freq: Every day | ORAL | Status: DC
  Administered 2024-04-07 – 2024-04-15 (×9): 100 mg via ORAL
  Filled 2024-04-07 (×10): qty 2

## 2024-04-07 MED ORDER — EZETIMIBE 10 MG PO TABS
10.0000 mg | ORAL_TABLET | Freq: Every day | ORAL | Status: DC
  Administered 2024-04-07 – 2024-04-20 (×14): 10 mg via ORAL
  Filled 2024-04-07 (×14): qty 1

## 2024-04-07 MED ORDER — POLYETHYLENE GLYCOL 3350 17 G PO PACK: 17.0000 g | PACK | Freq: Every day | ORAL | Status: DC | PRN

## 2024-04-07 MED ORDER — SODIUM CHLORIDE 0.9 % IV SOLN
500.0000 mg | Freq: Once | INTRAVENOUS | Status: AC
  Administered 2024-04-07: 500 mg via INTRAVENOUS
  Filled 2024-04-07: qty 5

## 2024-04-07 MED ORDER — ACETAMINOPHEN 500 MG PO TABS
1000.0000 mg | ORAL_TABLET | Freq: Four times a day (QID) | ORAL | Status: DC | PRN
  Administered 2024-04-18 – 2024-04-30 (×13): 1000 mg via ORAL
  Filled 2024-04-07 (×15): qty 2

## 2024-04-07 MED ORDER — MAGNESIUM SULFATE 2 GM/50ML IV SOLN
2.0000 g | Freq: Once | INTRAVENOUS | Status: AC
  Administered 2024-04-07: 2 g via INTRAVENOUS
  Filled 2024-04-07: qty 50

## 2024-04-07 MED ORDER — IPRATROPIUM-ALBUTEROL 0.5-2.5 (3) MG/3ML IN SOLN
3.0000 mL | RESPIRATORY_TRACT | Status: AC
  Administered 2024-04-07 (×2): 3 mL via RESPIRATORY_TRACT
  Filled 2024-04-07: qty 6

## 2024-04-07 MED ORDER — FUROSEMIDE 20 MG PO TABS: 20.0000 mg | ORAL_TABLET | Freq: Every day | ORAL | Status: DC

## 2024-04-07 MED ORDER — RANOLAZINE ER 500 MG PO TB12
500.0000 mg | ORAL_TABLET | Freq: Two times a day (BID) | ORAL | Status: DC
  Administered 2024-04-07: 500 mg via ORAL
  Filled 2024-04-07 (×15): qty 1

## 2024-04-07 MED ORDER — MONTELUKAST SODIUM 10 MG PO TABS
10.0000 mg | ORAL_TABLET | Freq: Every day | ORAL | Status: DC
  Administered 2024-04-07 – 2024-05-08 (×32): 10 mg via ORAL
  Filled 2024-04-07 (×32): qty 1

## 2024-04-07 MED ORDER — INSULIN ASPART 100 UNIT/ML IJ SOLN
0.0000 [IU] | INTRAMUSCULAR | Status: DC
  Administered 2024-04-07: 3 [IU] via SUBCUTANEOUS
  Administered 2024-04-07: 15 [IU] via SUBCUTANEOUS
  Administered 2024-04-07: 11 [IU] via SUBCUTANEOUS
  Administered 2024-04-08 (×2): 2 [IU] via SUBCUTANEOUS

## 2024-04-07 MED ORDER — AZITHROMYCIN 250 MG PO TABS
500.0000 mg | ORAL_TABLET | Freq: Every day | ORAL | Status: AC
  Administered 2024-04-08 – 2024-04-09 (×2): 500 mg via ORAL
  Filled 2024-04-07 (×2): qty 2

## 2024-04-07 MED ORDER — LACTATED RINGERS IV BOLUS
500.0000 mL | Freq: Once | INTRAVENOUS | Status: AC
  Administered 2024-04-07: 500 mL via INTRAVENOUS

## 2024-04-07 MED ORDER — GUAIFENESIN ER 600 MG PO TB12
600.0000 mg | ORAL_TABLET | Freq: Two times a day (BID) | ORAL | Status: DC
  Administered 2024-04-07 – 2024-05-09 (×67): 600 mg via ORAL
  Filled 2024-04-07 (×67): qty 1

## 2024-04-07 MED ORDER — LEVOTHYROXINE SODIUM 50 MCG PO TABS
50.0000 ug | ORAL_TABLET | Freq: Every day | ORAL | Status: DC
  Administered 2024-04-07 – 2024-05-10 (×34): 50 ug via ORAL
  Filled 2024-04-07 (×6): qty 1
  Filled 2024-04-07: qty 2
  Filled 2024-04-07 (×27): qty 1

## 2024-04-07 MED ORDER — INSULIN GLARGINE-YFGN 100 UNIT/ML ~~LOC~~ SOLN
7.0000 [IU] | Freq: Every day | SUBCUTANEOUS | Status: DC
  Administered 2024-04-07: 7 [IU] via SUBCUTANEOUS
  Filled 2024-04-07 (×2): qty 0.07

## 2024-04-07 NOTE — H&P (Addendum)
 History and Physical    Luis [name] FMW:[id number] DOB: [birth year] DOA: 04/07/2024  PCP: [name], MD   Patient coming from: Home   Chief Complaint:  Chief Complaint  Patient presents with   Respiratory Distress    HPI:  Luis [name] is a 83 y.o. male with hx of metastatic colon cancer, mets to lungs, liver, LN's, sarcoidosis, COPD, CAD hx PCI, HFpEF, hypertension, hyperlipidemia, hypothyroidism, who presented with progressive shortness of breath.  Reports that symptoms began about 1 week ago with exertional dyspnea, chest congestion, minimally productive cough with clear and scant blood-tinged sputum.  He was seen by pulmonology and was started on Augmentin  on Tuesday.  By Thursday his symptoms started worsening and progressively worsened over the weekend.  He ended up checking his pulse oximeter at home which was reading in the high 60s so he sought care.   Review of Systems:  ROS complete and negative except as marked above   Allergies  Allergen Reactions   Losartan  Potassium Other (See Comments)    Hyperkalemia    Prior to Admission medications   Medication Sig Start Date End Date Taking? Authorizing Provider  albuterol  (PROVENTIL ) (2.5 MG/3ML) 0.083% nebulizer solution INHALE 3 ML BY NEBULIZATION EVERY 6 HOURS AS NEEDED FOR WHEEZING OR SHORTNESS OF BREATH 11/12/23   Cobb, Comer GAILS, NP  albuterol  (VENTOLIN  HFA) 108 (90 Base) MCG/ACT inhaler INHALE 1-2 PUFFS BY MOUTH EVERY 6 HOURS AS NEEDED FOR WHEEZE OR SHORTNESS OF BREATH 07/03/22   Hunsucker, Donnice SAUNDERS, MD  amoxicillin -clavulanate (AUGMENTIN ) 875-125 MG tablet Take 1 tablet by mouth 2 (two) times daily. 04/03/24   Hope Almarie ORN, NP  apixaban (ELIQUIS) 5 MG TABS tablet Take 5 mg by mouth 2 (two) times daily. 01/17/24   [provider]  budesonide -glycopyrrolate-formoterol (BREZTRI  AEROSPHERE) 160-9-4.8 MCG/ACT AERO inhaler Inhale 2 puffs into the lungs in the morning and at bedtime. 04/03/24    Hope Almarie ORN, NP  cetirizine  (ZYRTEC ) 10 MG tablet Take 1 tablet (10 mg total) by mouth daily. Patient taking differently: Take 10 mg by mouth daily as needed for allergies. 09/13/23   Desai, Nikita S, MD  Cholecalciferol  (VITAMIN D3) 2000 units capsule Take 2,000 Units by mouth daily.    [provider]  dronedarone  (MULTAQ ) 400 MG tablet Take 1 tablet (400 mg total) by mouth 2 (two) times daily with a meal. 02/12/24   Ladona Heinz, MD  Dupilumab  (DUPIXENT ) 300 MG/2ML SOAJ Inject 300 mg into the skin every 14 (fourteen) days. 03/31/24   Hunsucker, Donnice SAUNDERS, MD  ezetimibe  (ZETIA ) 10 MG tablet TAKE 1 TABLET BY MOUTH EVERY DAY 01/01/24   Swinyer, Rosaline HERO, NP  fluticasone  (FLONASE ) 50 MCG/ACT nasal spray Place 2 sprays into both nostrils daily. 09/25/23   Hunsucker, Donnice SAUNDERS, MD  furosemide  (LASIX ) 20 MG tablet Take 1 tablet (20 mg total) by mouth daily as needed for fluid or edema. Patient taking differently: Take 20 mg by mouth daily. 01/08/24   Ladona Heinz, MD  gabapentin  (NEURONTIN ) 100 MG capsule Take 200 mg by mouth at bedtime.    [provider]  guaiFENesin -dextromethorphan  (ROBITUSSIN DM) 100-10 MG/5ML syrup Take 10 mLs by mouth every 6 (six) hours as needed for cough.    [provider]  ibuprofen (ADVIL) 200 MG tablet Take 400 mg by mouth every 6 (six) hours as needed for moderate pain (pain score 4-6) or mild pain (pain score 1-3).    [provider]  levothyroxine  (SYNTHROID ) 50  MCG tablet Take 50 mcg by mouth daily before breakfast. 02/20/23   [provider]  Magnesium  400 MG TABS Take 400 mg by mouth in the morning and at bedtime.    [provider]  metroNIDAZOLE (METROCREAM) 0.75 % cream Apply 1 Application topically 2 (two) times daily as needed (rash). 09/21/23   [provider]  minocycline (MINOCIN) 100 MG capsule Take 100 mg by mouth daily. 12/09/23   [provider]  montelukast  (SINGULAIR ) 10 MG tablet Take 1  tablet (10 mg total) by mouth at bedtime. 09/13/23   Desai, Nikita S, MD  Multiple Vitamins-Minerals (VITRUM SENIOR) TABS Take 1 tablet by mouth daily.    [provider]  Naphazoline-Pheniramine ([employer] EYE ALLERGY RELIEF) 0.027-0.315 % SOLN Place 1 drop into both eyes 2 (two) times daily.    [provider]  naproxen  sodium (ALEVE ) 220 MG tablet Take 440 mg by mouth 2 (two) times daily as needed (pain).    [provider]  nitroGLYCERIN  (NITROSTAT ) 0.4 MG SL tablet PLACE 1 TABLET UNDER THE TONGUE EVERY 5 MINUTES AS NEEDED FOR CHEST PAIN FOR 3 DOSES 08/15/23   Lelon Hamilton T, PA-C  predniSONE  (DELTASONE ) 5 MG tablet Take 3 tablets (15mg ) daily 04/03/24   Hope Almarie ORN, NP  ranolazine  (RANEXA ) 500 MG 12 hr tablet TAKE 1 TABLET BY MOUTH TWICE A DAY 10/04/23   Weaver, Scott T, PA-C  rosuvastatin  (CRESTOR ) 20 MG tablet TAKE 1 TABLET BY MOUTH EVERY DAY 02/07/24   Ladona Heinz, MD  traMADol  (ULTRAM ) 50 MG tablet Take 2 tablets (100 mg total) by mouth every 6 (six) hours as needed. 05/29/21   Ebbie Cough, MD    Past Medical History:  Diagnosis Date   Adenocarcinoma of cecum (HCC) 06/21/2017   Arthritis    mid back; hands; knees (02/24/2015)   Asthmatic bronchitis with acute exacerbation 06/27/2021   Basal cell carcinoma    left shoulder; mid chest; right eyelid (02/24/2015)   CAD (coronary artery disease)    a. 08/2014 Cath/PCI: LM nl, LAD 30p, D1 95 (2.25x12 Resolute Integrity DES), LCX small, RI 100 (attempted PCI) - branches fill via L->L collats, RCA dominant, nl, RPDA/PLA nl, EF 60^. b. 02/24/2015 PCI CTO of Ramus DES x2.   Chronic bronchitis (HCC)    hx   Diverticulosis 05/2005   Elevated lipase    Fuchs' corneal dystrophy    History of adenomatous polyp of colon 05/2005   8 mm adenoma   History of blood transfusion    related to some of my surgeries   Hyperlipidemia    Hypertension    Iron deficiency anemia due to chronic blood loss 06/21/2017    Pneumonia    Prostate cancer (HCC) 2011   S/P seed implant   Sarcoidosis    Thrombocytopenia    a. Noted on prior labs, unclear of what w/u done.   Trifascicular block     Past Surgical History:  Procedure Laterality Date   BASAL CELL CARCINOMA EXCISION Left    shoulder   CARDIAC CATHETERIZATION N/A 02/24/2015   Procedure: Coronary/Bypass Graft CTO Intervention;  Surgeon: Candyce GORMAN Reek, MD;  Location: [facility] INVASIVE CV LAB;  Service: Cardiovascular;  Laterality: N/A;   CARDIAC CATHETERIZATION  02/24/2015   Procedure: Coronary/Graft Atherectomy;  Surgeon: Candyce GORMAN Reek, MD;  Location: [facility] INVASIVE CV LAB;  Service: Cardiovascular;;   CARDIOVERSION N/A 02/07/2024   Procedure: CARDIOVERSION;  Surgeon: Raford Riggs, MD;  Location: [facility] INVASIVE CV LAB;  Service:  Cardiovascular;  Laterality: N/A;   CATARACT EXTRACTION W/ INTRAOCULAR LENS  IMPLANT, BILATERAL Bilateral ~ 2011-2012   COLONOSCOPY W/ POLYPECTOMY  06/08/2005 and 10/18/2010   8 mm adenoma 2006, none 2012. Diverticulosis and internal hemorrhoids.   CORNEAL TRANSPLANT Bilateral ~ 2011-2012   @ same time as cataract OR   CORONARY ANGIOPLASTY WITH STENT PLACEMENT  08/2014; 02/24/2015   1 stent + 1 stent   CORONARY STENT INTERVENTION N/A 01/02/2019   Procedure: CORONARY STENT INTERVENTION;  Surgeon: Dann Candyce RAMAN, MD;  Location: [facility] INVASIVE CV LAB;  Service: Cardiovascular;  Laterality: N/A;   EYE SURGERY     FINGER SURGERY Right 2014   reattached middle finger   INSERTION PROSTATE RADIATION SEED  ~ 2011   IR IMAGING GUIDED PORT INSERTION  03/04/2019   IR RADIOLOGIST EVAL & MGMT  05/26/2020   IR US  GUIDE BX ASP/DRAIN  03/04/2019   LAPAROSCOPIC CHOLECYSTECTOMY  2015   LAPAROSCOPIC PARTIAL COLECTOMY N/A 07/26/2017   Procedure: LAPAROSCOPIC PARTIAL COLECTOMY, EXCISION SCROTAL CYST;  Surgeon: Tanda Locus, MD;  Location: [facility] ORS;  Service: General;  Laterality: N/A;   LEFT HEART CATH AND CORONARY ANGIOGRAPHY N/A  01/02/2019   Procedure: LEFT HEART CATH AND CORONARY ANGIOGRAPHY;  Surgeon: Dann Candyce RAMAN, MD;  Location: [facility] INVASIVE CV LAB;  Service: Cardiovascular;  Laterality: N/A;   LEFT HEART CATHETERIZATION WITH CORONARY ANGIOGRAM N/A 08/12/2014   Procedure: LEFT HEART CATHETERIZATION WITH CORONARY ANGIOGRAM;  Surgeon: Ozell JONETTA Fell, MD;  Location: [facility] CATH LAB;  Service: Cardiovascular;  Laterality: N/A;   LYMPH NODE BIOPSY     mid chest   MOHS SURGERY Right ~ 2011   eyelid; for basal cell   RADIOLOGY WITH ANESTHESIA N/A 06/30/2020   Procedure: IR WITH ANESTHESIA MICROWAVE ABLATION;  Surgeon: Alona Corners, DO;  Location: [facility] ORS;  Service: Anesthesiology;  Laterality: N/A;   RIGHT/LEFT HEART CATH AND CORONARY ANGIOGRAPHY N/A 04/25/2022   Procedure: RIGHT/LEFT HEART CATH AND CORONARY ANGIOGRAPHY;  Surgeon: Dann Candyce RAMAN, MD;  Location: [facility] INVASIVE CV LAB;  Service: Cardiovascular;  Laterality: N/A;   THORACENTESIS N/A 02/11/2021   Procedure: MILANA;  Surgeon: Annella Donnice SAUNDERS, MD;  Location: [facility] ENDOSCOPY;  Service: Pulmonary;  Laterality: N/A;   THORACENTESIS Right 04/04/2021   Procedure: THORACENTESIS;  Surgeon: Claudene Toribio BROCKS, MD;  Location: [facility] ENDOSCOPY;  Service: Pulmonary;  Laterality: Right;   THORACENTESIS Right 08/16/2021   Procedure: THORACENTESIS;  Surgeon: Harold Scholz, MD;  Location: [facility] ENDOSCOPY;  Service: Pulmonary;  Laterality: Right;   THORACENTESIS Right 11/29/2021   Procedure: THORACENTESIS;  Surgeon: Kara Dorn NOVAK, MD;  Location: [facility] ENDOSCOPY;  Service: Pulmonary;  Laterality: Right;   THORACENTESIS N/A 03/17/2022   Procedure: MILANA;  Surgeon: Annella Donnice SAUNDERS, MD;  Location: [facility] ENDOSCOPY;  Service: Pulmonary;  Laterality: N/A;   THORACENTESIS N/A 07/14/2022   Procedure: MILANA;  Surgeon: Brenna Adine CROME, DO;  Location: [facility] ENDOSCOPY;  Service: Pulmonary;  Laterality: N/A;   THORACENTESIS N/A 10/11/2022   Procedure: MILANA;  Surgeon:  Harold Scholz, MD;  Location: [facility] ENDOSCOPY;  Service: Pulmonary;  Laterality: N/A;   THOROCOTOMY WITH LOBECTOMY Right ~ 1991   partial removal right lung   TONSILLECTOMY  ~ 1950     reports that he has quit smoking. His smoking use included cigars. He has never used smokeless tobacco. He reports current alcohol use of about 2.0 standard drinks of alcohol per week. He reports that he does not use drugs.  Family History  Problem Relation Age  of Onset   Heart disease Father 72       Died from hardening of the arteries age 65   Coronary artery disease Sister 39   Heart attack Sister    Colon cancer Neg Hx    Throat cancer Neg Hx    Pancreatic cancer Neg Hx    Prostate cancer Neg Hx    Stroke Neg Hx    Hypertension Neg Hx      Physical Exam: Vitals:   04/07/24 0023 04/07/24 0047 04/07/24 0048  BP: (!) 161/75    Pulse: 86    Resp: 16    Temp: 98.1 F (36.7 C)    TempSrc: Oral    SpO2: (!) 74% (!) 74%   Weight:   99.6 kg  Height:   5' 11 (1.803 m)    Gen: Awake, alert, chronically ill appearing.   CV: Regular, normal S1, S2, no murmurs  Resp: Normal WOB, on [place], tachypneic esp when he talks becomes winded and starts speaking in short sentences. There is slightly diminished air movement without significant wheezing, there are fine rales in the R base and L posterior mid lung fields. Abd: Round, normoactive, nontender MSK: Symmetric, 1+ edema  Skin: No rashes or lesions to exposed skin  Neuro: Alert and interactive  Psych: euthymic, appropriate    Data review:   Labs reviewed, notable for:   K5.5 Bicarb 19, AG 18 Creatinine 0.89 Blood glucose 260 BNP 249 High-sensitivity Trop 29 WBC 14 Hemoglobin 11, macrocytic  Platelet 144   Micro:  Results for orders placed or performed during the hospital encounter of 08/24/23  Resp panel by RT-PCR (RSV, Flu A&B, Covid) Anterior Nasal Swab     Status: None   Collection Time: 08/24/23  8:06 PM   Specimen: Anterior Nasal  Swab  Result Value Ref Range Status   SARS Coronavirus 2 by RT PCR NEGATIVE NEGATIVE Final    Comment: (NOTE) SARS-CoV-2 target nucleic acids are NOT DETECTED.  The SARS-CoV-2 RNA is generally detectable in upper respiratory specimens during the acute phase of infection. The lowest concentration of SARS-CoV-2 viral copies this assay can detect is 138 copies/mL. A negative result does not preclude SARS-Cov-2 infection and should not be used as the sole basis for treatment or other patient management decisions. A negative result may occur with  improper specimen collection/handling, submission of specimen other than nasopharyngeal swab, presence of viral mutation(s) within the areas targeted by this assay, and inadequate number of viral copies(<138 copies/mL). A negative result must be combined with clinical observations, patient history, and epidemiological information. The expected result is Negative.  Fact Sheet for Patients:  bloggercourse.com  Fact Sheet for Healthcare Providers:  seriousbroker.it  This test is no t yet approved or cleared by the [place]  FDA and  has been authorized for detection and/or diagnosis of SARS-CoV-2 by FDA under an Emergency Use Authorization (EUA). This EUA will remain  in effect (meaning this test can be used) for the duration of the COVID-19 declaration under Section 564(b)(1) of the Act, 21 U.S.C.section 360bbb-3(b)(1), unless the authorization is terminated  or revoked sooner.       Influenza A by PCR NEGATIVE NEGATIVE Final   Influenza B by PCR NEGATIVE NEGATIVE Final    Comment: (NOTE) The Xpert Xpress SARS-CoV-2/FLU/RSV plus assay is intended as an aid in the diagnosis of influenza from Nasopharyngeal swab specimens and should not be used as a sole basis for treatment. Nasal washings and aspirates are unacceptable  for Xpert Xpress SARS-CoV-2/FLU/RSV testing.  Fact Sheet for  Patients: bloggercourse.com  Fact Sheet for Healthcare Providers: seriousbroker.it  This test is not yet approved or cleared by the [place]  FDA and has been authorized for detection and/or diagnosis of SARS-CoV-2 by FDA under an Emergency Use Authorization (EUA). This EUA will remain in effect (meaning this test can be used) for the duration of the COVID-19 declaration under Section 564(b)(1) of the Act, 21 U.S.C. section 360bbb-3(b)(1), unless the authorization is terminated or revoked.     Resp Syncytial Virus by PCR NEGATIVE NEGATIVE Final    Comment: (NOTE) Fact Sheet for Patients: bloggercourse.com  Fact Sheet for Healthcare Providers: seriousbroker.it  This test is not yet approved or cleared by the [place]  FDA and has been authorized for detection and/or diagnosis of SARS-CoV-2 by FDA under an Emergency Use Authorization (EUA). This EUA will remain in effect (meaning this test can be used) for the duration of the COVID-19 declaration under Section 564(b)(1) of the Act, 21 U.S.C. section 360bbb-3(b)(1), unless the authorization is terminated or revoked.  Performed at [facility], [address] [place], [name] [address]   Blood culture (routine x 2)     Status: None   Collection Time: 08/24/23 11:37 PM   Specimen: BLOOD  Result Value Ref Range Status   Specimen Description   Final    BLOOD RIGHT CHEST Performed at [facility], [address] [place], [name] [address]    Special Requests   Final    BOTTLES DRAWN AEROBIC AND ANAEROBIC Blood Culture adequate volume Performed at [facility], [address], [place], [name] [address]    Culture   Final    NO GROWTH 5 DAYS Performed at [facility], [address], [place], [name] [address]    Report Status 08/30/2023 FINAL  Final   Blood culture (routine x 2)     Status: None   Collection Time: 08/24/23 11:42 PM   Specimen: BLOOD  Result Value Ref Range Status   Specimen Description   Final    BLOOD LEFT ANTECUBITAL Performed at [facility], [address], [place], [name] [address]    Special Requests   Final    BOTTLES DRAWN AEROBIC AND ANAEROBIC Blood Culture adequate volume Performed at [facility], [address] [place], [name] [address]    Culture   Final    NO GROWTH 5 DAYS Performed at [facility], [address] [place], [name] [address]    Report Status 08/30/2023 FINAL  Final  Respiratory (~20 pathogens) panel by PCR     Status: None   Collection Time: 08/25/23 12:27 PM   Specimen: Nasopharyngeal Swab; Respiratory  Result Value Ref Range Status   Adenovirus NOT DETECTED NOT DETECTED Final   Coronavirus 229E NOT DETECTED NOT DETECTED Final    Comment: (NOTE) The Coronavirus on the Respiratory Panel, DOES NOT test for the novel  Coronavirus (2019 nCoV)    Coronavirus HKU1 NOT DETECTED NOT DETECTED Final   Coronavirus NL63 NOT DETECTED NOT DETECTED Final   Coronavirus OC43 NOT DETECTED NOT DETECTED Final   Metapneumovirus NOT DETECTED NOT DETECTED Final   Rhinovirus / Enterovirus NOT DETECTED NOT DETECTED Final   Influenza A NOT DETECTED NOT DETECTED Final   Influenza B NOT DETECTED NOT DETECTED Final   Parainfluenza Virus 1 NOT DETECTED NOT DETECTED Final   Parainfluenza Virus 2 NOT DETECTED NOT DETECTED Final   Parainfluenza  Virus 3 NOT DETECTED NOT DETECTED Final   Parainfluenza Virus 4 NOT DETECTED NOT DETECTED Final   Respiratory Syncytial Virus NOT DETECTED NOT DETECTED Final   Bordetella pertussis NOT DETECTED NOT DETECTED Final   Bordetella Parapertussis NOT DETECTED NOT DETECTED Final   Chlamydophila pneumoniae NOT DETECTED NOT DETECTED Final   Mycoplasma pneumoniae NOT DETECTED NOT DETECTED Final    Comment: Performed at [facility], [address], [place], [name] [address]  Expectorated Sputum Assessment w Gram Stain, Rflx to Resp Cult     Status: None   Collection Time: 08/26/23  5:49 AM   Specimen: Sputum  Result Value Ref Range Status   Specimen Description SPUTUM  Final   Special Requests NONE  Final   Sputum evaluation   Final    THIS SPECIMEN IS ACCEPTABLE FOR SPUTUM CULTURE Performed at [facility], [address] [place], [name] [address]    Report Status 08/26/2023 FINAL  Final  Culture, Respiratory w Gram Stain     Status: None   Collection Time: 08/26/23  5:49 AM   Specimen: SPU  Result Value Ref Range Status   Specimen Description SPUTUM  Final   Special Requests NONE Reflexed from [id number]  Final   Gram Stain   Final    RARE SQUAMOUS EPITHELIAL CELLS PRESENT RARE WBC PRESENT, PREDOMINANTLY MONONUCLEAR FEW GRAM POSITIVE COCCI IN CLUSTERS RARE GRAM NEGATIVE RODS    Culture   Final    FEW Normal respiratory flora-no Staph aureus or Pseudomonas seen Performed at [facility], [address] [place], [name] [address]    Report Status 08/28/2023 FINAL  Final   *Note: Due to a large number of results and/or encounters for the requested time period, some results have not been displayed. A complete set of results can be found in Results Review.    Imaging reviewed:  DG Chest Port 1 View Result Date: 04/07/2024 EXAM: 1 VIEW(S) XRAY OF THE CHEST 04/07/2024 01:10:35 AM COMPARISON: Chest x-ray 08/24/1998 to 25. CLINICAL HISTORY: Shortness of breath. FINDINGS: LINES, TUBES AND DEVICES: Right chest port catheter tip ends in the SVC. LUNGS AND PLEURA: Patient is rotated. There is increased density in the right paramediastinal region. Patchy airspace opacities in the right lung base have increased. There are new left infrahilar airspace opacities. There is a small right pleural effusion. There are calcified mediastinal and bilateral hilar lymph nodes. No pulmonary edema. No  pneumothorax. HEART AND MEDIASTINUM: The cardiomediastinal silhouette appears enlarged and unchanged. There are calcified mediastinal and bilateral hilar lymph nodes. BONES AND SOFT TISSUES: No acute osseous abnormality. IMPRESSION: 1. Increased patchy airspace opacities in the right lung base and new left infrahilar airspace opacities concerning for multifocal pneumonia . 2. Small right pleural effusion. 3. Enlarged and unchanged cardiomediastinal silhouette. Electronically signed by: Greig Pique MD 04/07/2024 01:16 AM EDT RP Workstation: [id number]    EKG:  Personally reviewed sinus rhythm, LAD, LVH, RBBB, QTc 512 not corrected for BBB, no overt ischemic change  ED Course:  On ED arrival sat 74% on room air he is placed on 4 L O2 with recovery.  Ordered for nebs, ceftriaxone , azithromycin , Solu-Medrol    Assessment/Plan:  83 y.o. male with hx metastatic colon cancer, R hemicolectomy, mets to lungs, liver, LN's, history of wedge resection, sarcoidosis, COPD, PHTN suggested by imaging, CAD hx PCI, HFpEF, aortic ectasia, hypertension, hyperlipidemia, hypothyroidism, who presented with progressive shortness of breath, found to have acute hypoxic respiratory failure with  multifocal pneumonia  Acute hypoxic respiratory failure ? Multifocal pneumonia v possible changes of sarcoid, lung mets  Presenting with ~ 1 week DOE, cough. Progressive symptoms despite starting on Augmentin  OP. On arrival sat 74% on room air requiring 4 L O2 currently, not on home O2.  Chest x-ray demonstrating patchy airspace opacities right base (chronic) and new left infrahilar opacity concerning for multifocal pneumonia.  While he has history of structural lung disease no prior history of MRSA/Pseudomonas. - CT chest without contrast to further evaluate acute pulmonary process considering his history of sarcoid, prior lung mets - Okay to continue on ceftriaxone , azithromycin  for now, low threshold to broaden if worsening  respiratory status. - Continue on steroids with prednisone  40 mg daily for possible component sarcoid flare - Follow-up flu/COVID/RSV, if negative would send RVP, check sputum culture, procalcitonin - Continue home controllers breztri , Scheduled DuoNebs, albuterol  as needed, I-S, guaifenesin , out of bed to chair as able -- Home O2 screen once he is doing better from respiratory standpoint   Acute myocardial injury No history of chest pain.  High-sensitivity Trop 29 -> 33.  EKG without overt ischemic changes.  Suspect this is demand in the setting of hypoxia and respiratory failure. - Management directed at respiratory failure for above.  Hyperkalemia, mild ?  Shifted in the setting of underlying acidosis - Check lactate, VBG, trend potassium - Dose of Lokelma - If remains acidotic can start on bicarb tabs  Hyperglycemia No known history of diabetes - Check A1c, start on SSI for very sensitive, may need escalation in the setting of steroids  Goals of care  -- Did not go into this during my initial interview, but note that he currently wishes to be full code. With his hx metastatic colon CA, and advanced lung disease I think his outcomes would be poor in event of resuscitation/intubation. Will need additional discussions around his code status and goals.   Chronic medical problems: -> pending pharm hx, completed based on fill hx mainly  Metastatic colon cancer, R hemicolectomy, mets to lungs, liver, LN's: Follows with [facility], has known RUL nodule and opacities in the R base ? Lymphangitic spread v. Inflammation/infection (last in 6/'25). He is currently on maintenance 5FU and Panitumumab   Sarcoidosis: Follows with pulmonology, appears that he is currently on chronic steroids with prednisone  at a dose of 15 mg daily  COPD with eosinophilia: On Dupixent , Breztri , montelukast , see above treating with steroid.  PHTN: Suggested on imaging, noted  CAD hx PCI: On DOAC, continue home  rosuvastatin , zetia ,  Ranolazine   Aflutter: Continue home Apixaban, Dronedarone  - although note with his significant lung disease, question if this would be the best medication. Close f/u with cardiology re: potential alternatives  HFpEF: Continue home lasix   Aortic ectasia: OP surveillance  HTN: ? If still on antihypertensive  HLD: See CAD  Hypothyroidism: Continue home levothyroxine   Chronic anemia: Hemoglobin near baseline but has worsening macrocytosis recently.  Check B12 and folate. ?Chronic pain: Continue home Gabapentin   ?rosacea: continue home Minocycline   Body mass index is 30.62 kg/m. Obesity class I    DVT prophylaxis:  Eliquis Code Status:  Full Code Diet:  Diet Orders (From admission, onward)    None      Family Communication:  None   Consults:  None   Admission status:   Inpatient, Telemetry bed  Severity of Illness: The appropriate patient status for this patient is INPATIENT. Inpatient status is judged to be reasonable and necessary in  order to provide the required intensity of service to ensure the patient's safety. The patient's presenting symptoms, physical exam findings, and initial radiographic and laboratory data in the context of their chronic comorbidities is felt to place them at high risk for further clinical deterioration. Furthermore, it is not anticipated that the patient will be medically stable for discharge from the hospital within 2 midnights of admission.   * I certify that at the point of admission it is my clinical judgment that the patient will require inpatient hospital care spanning beyond 2 midnights from the point of admission due to high intensity of service, high risk for further deterioration and high frequency of surveillance required.*   Dorn Dawson, MD [facility]  How to contact the [facility] Attending or Consulting provider 7A - 7P or covering provider during after hours 7P -7A, for this patient.  Check the care team in [facility]  and look for a) attending/consulting [facility] provider listed and b) the [facility] team listed Log into [email or web address] and use [place]'s universal password to access. If you do not have the password, please contact the hospital operator. Locate the [facility] provider you are looking for under [facility] and page to a number that you can be directly reached. If you still have difficulty reaching the provider, please page the [facility] (Director on Call) for the Hospitalists listed on amion for assistance.  04/07/2024, 1:32 AM

## 2024-04-07 NOTE — ED Notes (Signed)
 Patient transported to CT

## 2024-04-07 NOTE — Progress Notes (Signed)
 Patient seen and examined personally, I reviewed the chart, history and physical and admission note, done by admitting physician this morning and agree with the same with following addendum.  Please refer to the morning admission note for more detailed plan of care.  Briefly,  Luis [name] is a 83 y.o. male with PMH of  metastatic colon cancer, mets to lungs, liver, LN's, sarcoidosis, COPD, CAD hx PCI, HFpEF, hypertension, hyperlipidemia, hypothyroidism, who presented with progressive shortness of breath onset x 1 wk  with  exertional dyspnea, chest congestion, minimally productive cough with clear and scant blood-tinged sputum, seen by pulmonology and was started on Augmentin  on Tuesday but by Thursday his symptoms started worsening and progressively worsened over the weekend.  He ended up checking his pulse oximeter at home which was reading in the high 60s so he presented to the ED  In the ED tachypneic, hypoxic,labs-mag low 1.5 hyperglycemic 291 lactic acidosis 3.3 Pro-Cal 1.5 leukocytosis 14.5, A1c 7.1, high sensitive troponin 29 proBNP 249 EKG sinus rhythm LAD LVH prolonged Qtc, situational up to 75% room air given 4 L nasal cannula Chest x-ray>> right lung base increased patchy airspace opacities and new left infrahilar airspace opacities concerning for multifocal pneumonia small right pleural effusion CT chest>> Postsurgical and radiation changes in the right lower lobe. Multifocal ground glass opacity with mosaic attenuation in the left lung, favoring mild multifocal infection/pneumonia, although pneumonitis related to immunotherapy is also possible.  Stable underlying sequela of sarcoidosis.  Patient admitted for acute hypoxic respiratory failure with concern for multifocal pneumonia in the setting of metastatic colon cancer, on immunotherapy He went to the bathroom this morning and was hypoxic although not somewhat short of breath.  He is alert awake oriented communicative interactive  currently feels better  Assessment and plan:  Acute hypoxic respiratory failure Possible multifocal pneumonia versus pneumonitis due to immunotherapy versus metastatic colon cancer related History of sarcoidosis: CT chest reviewed continue on ceftriaxone  azithromycin ,prednisone  (reports for last 2 weeks she is on 20 of prednisone  ) Continue bronchodilators I-S OOB. On Dupixent , Breztri , montelukast  PTA Continue supplemental oxygen.  He will likely need home oxygen upon discharge   Elevated troponin-suspect demand ischemia with flat troponin Hypomagnesemia: Replaced.  Recheck in the morning Lactic acidosis: Given IV fluids on admit, repeat lactic pending and came with  bili improving 2.5.  This is likely in the setting of patient's hyperglycemia as well  Type 2 diabetes with uncontrolled hyperglycemia: A1c at 7.0.  Keep on sliding scale insulin every 4 hour add Lantus 7 I, DM coordinator consulted Recent Labs  Lab 04/07/24 0442 04/07/24 0800 04/07/24 1244  GLUCAP  --  337* 396*  HGBA1C 7.1*  --   --      Metastatic colon cancer, R hemicolectomy, mets to lungs, liver, LN's: Follows with [facility], has known RUL nodule and opacities in the R base ? Lymphangitic spread v. Inflammation/infection (last in 6/'25). He is currently on maintenance 5FU and Panitumumab  every 2 weeks.  Sarcoidosis: Follows with pulmonology, appears that he is currently on chronic steroids with prednisone  at a dose of 15 mg daily   PHTN: Suggested on imaging, noted   HTN HLD CAD hx PCI: On DOAC, continue home rosuvastatin , zetia ,  Ranolazine    Aflutter: Continue home Apixaban, Dronedarone  - although note with his significant lung disease, question if this would be the best medication. Close f/u with cardiology re: potential alternatives   HFpEF Chronic leg edema: Continue home lasix .  Reports he has  a chronic leg edema  Aortic ectasia: OP surveillance   Hypothyroidism: Continue home levothyroxine     Chronic anemia:  Stable at baseline has worsening macrocytosis recently.  Check B12 and folate.  ?Chronic pain: Continue home Gabapentin    ?rosacea: continue home Minocycline.   Class  I Obesity w/ Body mass index is 30.62 kg/m.: will benefit with PCP follow-up, weight loss,healthy lifestyle and outpatient sleep eval if not done.  DVT prophylaxis:  Code Status:   Code Status: Prior Family Communication: plan of care discussed with patient at bedside. Patient status is: Remains hospitalized because of severity of illness Level of care: Telemetry Medical   Dispo: The patient is from: home            Anticipated disposition: TBD Objective: Vitals last 24 hrs: Vitals:   04/07/24 0503 04/07/24 0700 04/07/24 0915 04/07/24 1011  BP:  131/78  (!) 130/53  Pulse:  64  77  Resp:  (!) 22  18  Temp: 98.2 F (36.8 C)  (!) 97.5 F (36.4 C) 97.6 F (36.4 C)  TempSrc: Oral  Oral Oral  SpO2:  96%  90%  Weight:      Height:        Physical Examination: General exam: alert awake, oriented, older than stated age HEENT:Oral mucosa moist, Ear/Nose WNL grossly Respiratory system: Bilaterally clear BS,no use of accessory muscle Cardiovascular system: S1 & S2 +, No JVD. Gastrointestinal system: Abdomen soft,NT,ND, BS+ Nervous System: Alert, awake, moving all extremities,and following commands. Extremities: extremities warm, leg edema  ++ Skin: Warm, no rashes MSK: Normal muscle bulk,tone, power   Medications reviewed:  Scheduled Meds:  apixaban  5 mg Oral BID   [START ON 04/08/2024] azithromycin   500 mg Oral Daily   budesonide -glycopyrrolate-formoterol  2 puff Inhalation BID   dronedarone   400 mg Oral BID WC   ezetimibe   10 mg Oral Daily   gabapentin   200 mg Oral QHS   guaiFENesin   600 mg Oral BID   insulin aspart  0-15 Units Subcutaneous Q4H   insulin glargine-yfgn  7 Units Subcutaneous Daily   ipratropium-albuterol   3 mL Nebulization Q6H   levothyroxine   50 mcg Oral Q0600    minocycline  100 mg Oral Daily   montelukast   10 mg Oral QHS   predniSONE   40 mg Oral Q breakfast   ranolazine   500 mg Oral BID   rosuvastatin   20 mg Oral Daily   Continuous Infusions:  [START ON 04/08/2024] cefTRIAXone  (ROCEPHIN )  IV     Diet: Diet Order             Diet heart healthy/carb modified Room service appropriate? Yes; Fluid consistency: Thin  Diet effective now

## 2024-04-07 NOTE — ED Triage Notes (Signed)
 Pt arrived from home via POV in resp distress. Pt states that the difficulty breathing started ;ast week and is pregressively getting worse. Pt was 76% on room air '

## 2024-04-07 NOTE — ED Notes (Signed)
 A heart healthy /carb modified breakfast tray has been ordered for the patient.  ETA: within the hour.

## 2024-04-07 NOTE — Plan of Care (Signed)
  Problem: Education: Goal: Ability to describe self-care measures that may prevent or decrease complications (Diabetes Survival Skills Education) will improve Outcome: Progressing   Problem: Skin Integrity: Goal: Risk for impaired skin integrity will decrease Outcome: Progressing   Problem: Tissue Perfusion: Goal: Adequacy of tissue perfusion will improve Outcome: Progressing   Problem: Activity: Goal: Risk for activity intolerance will decrease Outcome: Progressing   Problem: Nutrition: Goal: Adequate nutrition will be maintained Outcome: Progressing   Problem: Coping: Goal: Level of anxiety will decrease Outcome: Progressing

## 2024-04-07 NOTE — ED Provider Notes (Signed)
 [place] EMERGENCY DEPARTMENT AT [facility] Provider Note   CSN: [id number] Arrival date & time: 04/07/24  9980     Patient presents with: Respiratory Distress   Luis [name] is a 83 y.o. male.   83 yo M with a chief complaints of difficulty breathing.  Going over the past week but worsening over the past couple days.  He has a history of sarcoidosis and has had recurrent pneumonias.  Has been coughing no obvious fevers at home.  He also has been diagnosed with reactive airway disease.  He tried some albuterol  at home with some improvement.  Found to be hypoxic in triage.  Not on oxygen at home.        Prior to Admission medications   Medication Sig Start Date End Date Taking? Authorizing Provider  albuterol  (PROVENTIL ) (2.5 MG/3ML) 0.083% nebulizer solution INHALE 3 ML BY NEBULIZATION EVERY 6 HOURS AS NEEDED FOR WHEEZING OR SHORTNESS OF BREATH 11/12/23   Cobb, Comer GAILS, NP  albuterol  (VENTOLIN  HFA) 108 (90 Base) MCG/ACT inhaler INHALE 1-2 PUFFS BY MOUTH EVERY 6 HOURS AS NEEDED FOR WHEEZE OR SHORTNESS OF BREATH 07/03/22   Hunsucker, Donnice SAUNDERS, MD  amoxicillin -clavulanate (AUGMENTIN ) 875-125 MG tablet Take 1 tablet by mouth 2 (two) times daily. 04/03/24   Hope Almarie ORN, NP  apixaban (ELIQUIS) 5 MG TABS tablet Take 5 mg by mouth 2 (two) times daily. 01/17/24   [provider]  budesonide -glycopyrrolate-formoterol (BREZTRI  AEROSPHERE) 160-9-4.8 MCG/ACT AERO inhaler Inhale 2 puffs into the lungs in the morning and at bedtime. 04/03/24   Hope Almarie ORN, NP  cetirizine  (ZYRTEC ) 10 MG tablet Take 1 tablet (10 mg total) by mouth daily. Patient taking differently: Take 10 mg by mouth daily as needed for allergies. 09/13/23   Meade Verdon RAMAN, MD  Cholecalciferol  (VITAMIN D3) 2000 units capsule Take 2,000 Units by mouth daily.    [provider]  dronedarone  (MULTAQ ) 400 MG tablet Take 1 tablet (400 mg total) by mouth 2 (two) times daily with a meal. 02/12/24    Ladona Heinz, MD  Dupilumab  (DUPIXENT ) 300 MG/2ML SOAJ Inject 300 mg into the skin every 14 (fourteen) days. 03/31/24   Hunsucker, Donnice SAUNDERS, MD  ezetimibe  (ZETIA ) 10 MG tablet TAKE 1 TABLET BY MOUTH EVERY DAY 01/01/24   Swinyer, Rosaline HERO, NP  fluticasone  (FLONASE ) 50 MCG/ACT nasal spray Place 2 sprays into both nostrils daily. 09/25/23   Hunsucker, Donnice SAUNDERS, MD  furosemide  (LASIX ) 20 MG tablet Take 1 tablet (20 mg total) by mouth daily as needed for fluid or edema. Patient taking differently: Take 20 mg by mouth daily. 01/08/24   Ladona Heinz, MD  gabapentin  (NEURONTIN ) 100 MG capsule Take 200 mg by mouth at bedtime.    [provider]  guaiFENesin -dextromethorphan  (ROBITUSSIN DM) 100-10 MG/5ML syrup Take 10 mLs by mouth every 6 (six) hours as needed for cough.    [provider]  ibuprofen (ADVIL) 200 MG tablet Take 400 mg by mouth every 6 (six) hours as needed for moderate pain (pain score 4-6) or mild pain (pain score 1-3).    [provider]  levothyroxine  (SYNTHROID ) 50 MCG tablet Take 50 mcg by mouth daily before breakfast. 02/20/23   [provider]  Magnesium  400 MG TABS Take 400 mg by mouth in the morning and at bedtime.    [provider]  metroNIDAZOLE (METROCREAM) 0.75 % cream Apply 1 Application topically 2 (two) times daily as needed (rash). 09/21/23   [provider]  minocycline (MINOCIN) 100 MG capsule Take 100 mg by mouth daily. 12/09/23   [provider]  montelukast  (SINGULAIR ) 10 MG tablet Take 1 tablet (10 mg total) by mouth at bedtime. 09/13/23   Desai, Nikita S, MD  Multiple Vitamins-Minerals (VITRUM SENIOR) TABS Take 1 tablet by mouth daily.    [provider]  Naphazoline-Pheniramine ([employer] EYE ALLERGY RELIEF) 0.027-0.315 % SOLN Place 1 drop into both eyes 2 (two) times daily.    [provider]  naproxen  sodium (ALEVE ) 220 MG tablet Take 440 mg by mouth 2 (two) times daily as needed (pain).    [provider]  nitroGLYCERIN  (NITROSTAT ) 0.4 MG SL tablet PLACE 1 TABLET UNDER THE TONGUE EVERY 5 MINUTES AS NEEDED FOR CHEST PAIN FOR 3 DOSES 08/15/23   Lelon Hamilton T, PA-C  predniSONE  (DELTASONE ) 5 MG tablet Take 3 tablets (15mg ) daily 04/03/24   Hope Almarie ORN, NP  ranolazine  (RANEXA ) 500 MG 12 hr tablet TAKE 1 TABLET BY MOUTH TWICE A DAY 10/04/23   Weaver, Scott T, PA-C  rosuvastatin  (CRESTOR ) 20 MG tablet TAKE 1 TABLET BY MOUTH EVERY DAY 02/07/24   Ladona Heinz, MD  traMADol  (ULTRAM ) 50 MG tablet Take 2 tablets (100 mg total) by mouth every 6 (six) hours as needed. 05/29/21   Ebbie Cough, MD    Allergies: Losartan  potassium    Review of Systems  Updated Vital Signs BP (!) 161/75 (BP Location: Left Arm)   Pulse 86   Temp 98.1 F (36.7 C) (Oral)   Resp 16   Ht 5' 11 (1.803 m)   Wt 99.6 kg   SpO2 (!) 74%   BMI 30.62 kg/m   Physical Exam Vitals and nursing note reviewed.  Constitutional:      Appearance: He is well-developed.  HENT:     Head: Normocephalic and atraumatic.  Eyes:     Pupils: Pupils are equal, round, and reactive to light.  Neck:     Vascular: No JVD.  Cardiovascular:     Rate and Rhythm: Normal rate and regular rhythm.     Heart sounds: No murmur heard.    No friction rub. No gallop.  Pulmonary:     Effort: No respiratory distress.     Breath sounds: No wheezing.     Comments: Transmitted upper airway noises.  No obvious wheezes. Abdominal:     General: There is no distension.     Tenderness: There is no abdominal tenderness. There is no guarding or rebound.  Musculoskeletal:        General: Normal range of motion.     Cervical back: Normal range of motion and neck supple.  Skin:    Coloration: Skin is not pale.     Findings: No rash.  Neurological:     Mental Status: He is alert and oriented to person, place, and time.  Psychiatric:        Behavior: Behavior normal.     (all labs ordered are listed, but only abnormal results are  displayed) Labs Reviewed  BASIC METABOLIC PANEL WITH GFR - Abnormal; Notable for the following components:      Result Value   Potassium 5.5 (*)    CO2 19 (*)    Glucose, Bld 262 (*)    Calcium  8.5 (*)    Anion gap 18 (*)    All other components within normal limits  CBC - Abnormal; Notable for the following components:   WBC 14.5 (*)  RBC 3.72 (*)    Hemoglobin 11.9 (*)    HCT 38.1 (*)    MCV 102.4 (*)    RDW 17.6 (*)    Platelets 144 (*)    All other components within normal limits  BRAIN NATRIURETIC PEPTIDE - Abnormal; Notable for the following components:   B Natriuretic Peptide 249.3 (*)    All other components within normal limits  PROTIME-INR - Abnormal; Notable for the following components:   Prothrombin Time 17.3 (*)    INR 1.3 (*)    All other components within normal limits  TROPONIN I (HIGH SENSITIVITY) - Abnormal; Notable for the following components:   Troponin I (High Sensitivity) 29 (*)    All other components within normal limits  RESP PANEL BY RT-PCR (RSV, FLU A&B, COVID)  RVPGX2  CULTURE, BLOOD (ROUTINE X 2)  CULTURE, BLOOD (ROUTINE X 2)    EKG: EKG Interpretation Date/Time:  Monday April 07 2024 00:50:13 EDT Ventricular Rate:  78 PR Interval:  181 QRS Duration:  124 QT Interval:  449 QTC Calculation: 512 R Axis:   -22  Text Interpretation: Sinus rhythm Supraventricular bigeminy Right bundle branch block No significant change since last tracing Confirmed by Emil Share [phone]) on 04/07/2024 1:22:42 AM  Radiology: [facility] Chest Port 1 View Result Date: 04/07/2024 EXAM: 1 VIEW(S) XRAY OF THE CHEST 04/07/2024 01:10:35 AM COMPARISON: Chest x-ray 08/24/1998 to 25. CLINICAL HISTORY: Shortness of breath. FINDINGS: LINES, TUBES AND DEVICES: Right chest port catheter tip ends in the SVC. LUNGS AND PLEURA: Patient is rotated. There is increased density in the right paramediastinal region. Patchy airspace opacities in the right lung base have increased. There  are new left infrahilar airspace opacities. There is a small right pleural effusion. There are calcified mediastinal and bilateral hilar lymph nodes. No pulmonary edema. No pneumothorax. HEART AND MEDIASTINUM: The cardiomediastinal silhouette appears enlarged and unchanged. There are calcified mediastinal and bilateral hilar lymph nodes. BONES AND SOFT TISSUES: No acute osseous abnormality. IMPRESSION: 1. Increased patchy airspace opacities in the right lung base and new left infrahilar airspace opacities concerning for multifocal pneumonia . 2. Small right pleural effusion. 3. Enlarged and unchanged cardiomediastinal silhouette. Electronically signed by: Greig Pique MD 04/07/2024 01:16 AM EDT RP Workstation: [id number]     .Critical Care  Performed by: Emil Share, DO Authorized by: Emil Share, DO   Critical care provider statement:    Critical care time (minutes):  35   Critical care time was exclusive of:  Separately billable procedures and treating other patients   Critical care was time spent personally by me on the following activities:  Development of treatment plan with patient or surrogate, discussions with consultants, evaluation of patient's response to treatment, examination of patient, ordering and review of laboratory studies, ordering and review of radiographic studies, ordering and performing treatments and interventions, pulse oximetry, re-evaluation of patient's condition and review of old charts   Care discussed with: admitting provider      Medications Ordered in the ED  ipratropium-albuterol  (DUONEB) 0.5-2.5 (3) MG/3ML nebulizer solution 3 mL (has no administration in time range)  methylPREDNISolone  sodium succinate (SOLU-MEDROL ) 125 mg/2 mL injection 125 mg (has no administration in time range)  cefTRIAXone  (ROCEPHIN ) 2 g in sodium chloride  0.9 % 100 mL IVPB (has no administration in time range)  azithromycin  (ZITHROMAX ) 500 mg in sodium chloride  0.9 % 250 mL IVPB (has no  administration in time range)  Medical Decision Making Amount and/or Complexity of Data Reviewed Labs: ordered. Radiology: ordered.  Risk Prescription drug management.   83 yo M with a chief complaints of cough difficulty breathing.  Going on for the past week and worsening over the past couple days.  Patient has a history of sarcoidosis and tells me that he has had recurrent pneumonia.  Thinks this feels somewhat similar.  Chest x-ray with concern for diffuse infiltrates on my independent interpretation.  Radiology read as likely multifocal pneumonia. Will start on IV antibiotics.  Will discuss with medicine for admission.  The patients results and plan were reviewed and discussed.   Any x-rays performed were independently reviewed by myself.   Differential diagnosis were considered with the presenting HPI.  Medications  ipratropium-albuterol  (DUONEB) 0.5-2.5 (3) MG/3ML nebulizer solution 3 mL (has no administration in time range)  methylPREDNISolone  sodium succinate (SOLU-MEDROL ) 125 mg/2 mL injection 125 mg (has no administration in time range)  cefTRIAXone  (ROCEPHIN ) 2 g in sodium chloride  0.9 % 100 mL IVPB (has no administration in time range)  azithromycin  (ZITHROMAX ) 500 mg in sodium chloride  0.9 % 250 mL IVPB (has no administration in time range)    Vitals:   04/07/24 0023 04/07/24 0047 04/07/24 0048  BP: (!) 161/75    Pulse: 86    Resp: 16    Temp: 98.1 F (36.7 C)    TempSrc: Oral    SpO2: (!) 74% (!) 74%   Weight:   99.6 kg  Height:   5' 11 (1.803 m)    Final diagnoses:  Multifocal pneumonia  Acute respiratory failure with hypoxia (HCC)    Admission/ observation were discussed with the admitting physician, patient and/or family and they are comfortable with the plan.        Final diagnoses:  Multifocal pneumonia  Acute respiratory failure with hypoxia [facility])    ED Discharge Orders     None           Emil Share, DO 04/07/24 0125

## 2024-04-07 NOTE — Telephone Encounter (Signed)
 Pt calling to state he cannot make his CT imaging appt today because he is hospitalized.

## 2024-04-07 NOTE — ED Notes (Signed)
 RN pulled labs.

## 2024-04-07 NOTE — Hospital Course (Addendum)
 Luis [name] who presented with COPD exacerbation and decompensated heart failure.   83 y.o. male with PMH of metastatic colon cancer, mets to lungs, liver, LN's, sarcoidosis, COPD, coronary artery disease, heart failure, hypertension, hyperlipidemia, hypothyroidism, who presented with progressive shortness of breath onset x 1 wk with exertional dyspnea, chest congestion, minimally productive cough with clear and scant blood-tinged sputum,   S/P bronchoscopy BAL 10/31 - BAL culture-no growth to date AFB fungus PJP DFA- pending.  Cytology-atypical cells. Continue with prednisone  taper.  11/13: Mildly elevated blood pressure, still significant transaminitis-message sent to Dr. Clancy cardiologist regarding Multaq . Had 1 episode of bleeding while having bowel movement-history of hemorrhoid.  11/14: Hemodynamically stable, hemoglobin with some decreased 11.5, improving creatinine.  Becoming little more short of breath and hypoxic when stood for standing weight.  Giving 1 dose of IV Lasix  at 20 mg and restarting home Lasix . Pending formal cardiology consult regarding the use of Multaq .  11/15: Blood pressure started trending up so holding midodrine and placed parameters.  Multaq  was discontinued by cardiology and if he goes back in a flutter, he will need DCCV. 11/17 patient very weak and deconditioned  11/18 continue to have dyspnea.

## 2024-04-07 NOTE — Inpatient Diabetes Management (Signed)
 Inpatient Diabetes Program Recommendations  AACE/ADA: New Consensus Statement on Inpatient Glycemic Control (2015)  Target Ranges:  Prepandial:   less than 140 mg/dL      Peak postprandial:   less than 180 mg/dL (1-2 hours)      Critically ill patients:  140 - 180 mg/dL   Lab Results  Component Value Date   GLUCAP 396 (H) 04/07/2024   HGBA1C 7.1 (H) 04/07/2024    Review of Glycemic Control  Diabetes history: DM2?? Outpatient Diabetes medications: none listed Current orders for Inpatient glycemic control: Semglee 7 units daily, Novolog 0-15 units correction scale every 4 hours.  Inpatient Diabetes Program Recommendations:   Received diabetes coordinator consult for uncontrolled hyperglycemia. Agree with current insulin orders. Blood sugars probably elevated due to steroids; will continue to follow blood sugars while in the hospital. Noted that HgbA1C is 7.1%.   Marjorie Lunger RN BSN CDE Diabetes Coordinator Pager: [phone]  8am-5pm

## 2024-04-08 ENCOUNTER — Inpatient Hospital Stay (HOSPITAL_COMMUNITY)

## 2024-04-08 DIAGNOSIS — J9601 Acute respiratory failure with hypoxia: Secondary | ICD-10-CM | POA: Diagnosis not present

## 2024-04-08 DIAGNOSIS — I509 Heart failure, unspecified: Secondary | ICD-10-CM | POA: Diagnosis not present

## 2024-04-08 LAB — CBC
HCT: 34.6 % — ABNORMAL LOW (ref 39.0–52.0)
Hemoglobin: 11.1 g/dL — ABNORMAL LOW (ref 13.0–17.0)
MCH: 31.8 pg (ref 26.0–34.0)
MCHC: 32.1 g/dL (ref 30.0–36.0)
MCV: 99.1 fL (ref 80.0–100.0)
Platelets: 108 K/uL — ABNORMAL LOW (ref 150–400)
RBC: 3.49 MIL/uL — ABNORMAL LOW (ref 4.22–5.81)
RDW: 17.3 % — ABNORMAL HIGH (ref 11.5–15.5)
WBC: 14.5 K/uL — ABNORMAL HIGH (ref 4.0–10.5)
nRBC: 0 % (ref 0.0–0.2)

## 2024-04-08 LAB — BASIC METABOLIC PANEL WITH GFR
Anion gap: 12 (ref 5–15)
BUN: 19 mg/dL (ref 8–23)
CO2: 23 mmol/L (ref 22–32)
Calcium: 8.4 mg/dL — ABNORMAL LOW (ref 8.9–10.3)
Chloride: 98 mmol/L (ref 98–111)
Creatinine, Ser: 0.79 mg/dL (ref 0.61–1.24)
GFR, Estimated: >60 mL/min (ref >60)
Glucose, Bld: 113 mg/dL — ABNORMAL HIGH (ref 70–99)
Potassium: 4.1 mmol/L (ref 3.5–5.1)
Sodium: 133 mmol/L — ABNORMAL LOW (ref 135–145)

## 2024-04-08 LAB — GLUCOSE, CAPILLARY
Glucose-Capillary: 105 mg/dL — ABNORMAL HIGH (ref 70–99)
Glucose-Capillary: 125 mg/dL — ABNORMAL HIGH (ref 70–99)
Glucose-Capillary: 126 mg/dL — ABNORMAL HIGH (ref 70–99)
Glucose-Capillary: 153 mg/dL — ABNORMAL HIGH (ref 70–99)
Glucose-Capillary: 156 mg/dL — ABNORMAL HIGH (ref 70–99)
Glucose-Capillary: 191 mg/dL — ABNORMAL HIGH (ref 70–99)
Glucose-Capillary: 253 mg/dL — ABNORMAL HIGH (ref 70–99)

## 2024-04-08 LAB — ECHOCARDIOGRAM COMPLETE
AR max vel: 1.61 cm2
AV Area VTI: 1.34 cm2
AV Area mean vel: 1.53 cm2
AV Mean grad: 4 mmHg
AV Peak grad: 8.5 mmHg
Ao pk vel: 1.46 m/s
Area-P 1/2: 2.58 cm2
Calc EF: 72.2 %
Height: 71 in
S' Lateral: 3 cm
Single Plane A2C EF: 74.9 %
Single Plane A4C EF: 69.5 %
Weight: 3513.25 [oz_av]

## 2024-04-08 LAB — MAGNESIUM: Magnesium: 1.8 mg/dL (ref 1.7–2.4)

## 2024-04-08 MED ORDER — GLIPIZIDE 5 MG PO TABS
2.5000 mg | ORAL_TABLET | Freq: Every day | ORAL | Status: DC
  Administered 2024-04-09 – 2024-04-19 (×10): 2.5 mg via ORAL
  Filled 2024-04-08 (×12): qty 0.5

## 2024-04-08 MED ORDER — METFORMIN HCL 500 MG PO TABS
500.0000 mg | ORAL_TABLET | Freq: Two times a day (BID) | ORAL | Status: DC
  Administered 2024-04-08 – 2024-04-17 (×18): 500 mg via ORAL
  Filled 2024-04-08 (×19): qty 1

## 2024-04-08 MED ORDER — PHENOL 1.4 % MT LIQD
1.0000 | OROMUCOSAL | Status: DC | PRN
  Administered 2024-04-08 – 2024-04-30 (×3): 1 via OROMUCOSAL
  Filled 2024-04-08: qty 177

## 2024-04-08 MED ORDER — INSULIN ASPART 100 UNIT/ML IJ SOLN
0.0000 [IU] | Freq: Three times a day (TID) | INTRAMUSCULAR | Status: DC
  Administered 2024-04-08: 3 [IU] via SUBCUTANEOUS
  Administered 2024-04-08: 8 [IU] via SUBCUTANEOUS
  Administered 2024-04-09: 2 [IU] via SUBCUTANEOUS
  Administered 2024-04-09: 8 [IU] via SUBCUTANEOUS
  Administered 2024-04-09 – 2024-04-10 (×2): 3 [IU] via SUBCUTANEOUS
  Administered 2024-04-10: 11 [IU] via SUBCUTANEOUS
  Administered 2024-04-11: 8 [IU] via SUBCUTANEOUS
  Administered 2024-04-11: 3 [IU] via SUBCUTANEOUS
  Administered 2024-04-12: 5 [IU] via SUBCUTANEOUS
  Administered 2024-04-12 – 2024-04-13 (×2): 3 [IU] via SUBCUTANEOUS
  Administered 2024-04-13: 8 [IU] via SUBCUTANEOUS
  Administered 2024-04-14: 2 [IU] via SUBCUTANEOUS
  Administered 2024-04-14: 5 [IU] via SUBCUTANEOUS
  Administered 2024-04-14: 3 [IU] via SUBCUTANEOUS
  Administered 2024-04-15 – 2024-04-16 (×3): 2 [IU] via SUBCUTANEOUS
  Administered 2024-04-16 – 2024-04-17 (×2): 3 [IU] via SUBCUTANEOUS
  Administered 2024-04-18: 2 [IU] via SUBCUTANEOUS
  Administered 2024-04-18: 3 [IU] via SUBCUTANEOUS
  Administered 2024-04-19: 8 [IU] via SUBCUTANEOUS
  Administered 2024-04-20 – 2024-04-21 (×3): 5 [IU] via SUBCUTANEOUS
  Administered 2024-04-22: 8 [IU] via SUBCUTANEOUS
  Administered 2024-04-22: 5 [IU] via SUBCUTANEOUS
  Administered 2024-04-23 – 2024-04-24 (×3): 3 [IU] via SUBCUTANEOUS
  Administered 2024-04-24: 5 [IU] via SUBCUTANEOUS
  Administered 2024-04-25 – 2024-04-26 (×3): 3 [IU] via SUBCUTANEOUS
  Administered 2024-04-26: 5 [IU] via SUBCUTANEOUS
  Administered 2024-04-27 – 2024-04-29 (×5): 3 [IU] via SUBCUTANEOUS
  Administered 2024-04-29: 5 [IU] via SUBCUTANEOUS
  Administered 2024-04-30: 3 [IU] via SUBCUTANEOUS
  Administered 2024-04-30: 5 [IU] via SUBCUTANEOUS
  Administered 2024-04-30 – 2024-05-01 (×2): 3 [IU] via SUBCUTANEOUS
  Administered 2024-05-01: 2 [IU] via SUBCUTANEOUS
  Administered 2024-05-01: 3 [IU] via SUBCUTANEOUS
  Administered 2024-05-02: 11 [IU] via SUBCUTANEOUS
  Administered 2024-05-02: 3 [IU] via SUBCUTANEOUS
  Administered 2024-05-03 (×2): 5 [IU] via SUBCUTANEOUS
  Administered 2024-05-04 (×2): 8 [IU] via SUBCUTANEOUS
  Administered 2024-05-04: 2 [IU] via SUBCUTANEOUS
  Administered 2024-05-05: 8 [IU] via SUBCUTANEOUS
  Administered 2024-05-05 – 2024-05-06 (×2): 3 [IU] via SUBCUTANEOUS
  Administered 2024-05-06: 11 [IU] via SUBCUTANEOUS
  Administered 2024-05-06: 2 [IU] via SUBCUTANEOUS
  Administered 2024-05-07 (×2): 3 [IU] via SUBCUTANEOUS
  Administered 2024-05-07: 5 [IU] via SUBCUTANEOUS
  Administered 2024-05-08 (×3): 2 [IU] via SUBCUTANEOUS
  Administered 2024-05-09 – 2024-05-10 (×2): 3 [IU] via SUBCUTANEOUS
  Filled 2024-04-08 (×2): qty 3
  Filled 2024-04-08: qty 11
  Filled 2024-04-08: qty 5
  Filled 2024-04-08 (×2): qty 3
  Filled 2024-04-08: qty 5
  Filled 2024-04-08: qty 2
  Filled 2024-04-08 (×2): qty 5
  Filled 2024-04-08: qty 3
  Filled 2024-04-08: qty 2
  Filled 2024-04-08: qty 5
  Filled 2024-04-08 (×2): qty 3
  Filled 2024-04-08: qty 8
  Filled 2024-04-08: qty 2
  Filled 2024-04-08 (×2): qty 3
  Filled 2024-04-08: qty 8
  Filled 2024-04-08 (×2): qty 3
  Filled 2024-04-08: qty 2
  Filled 2024-04-08: qty 5
  Filled 2024-04-08: qty 6
  Filled 2024-04-08: qty 2
  Filled 2024-04-08 (×2): qty 3
  Filled 2024-04-08: qty 5
  Filled 2024-04-08: qty 8
  Filled 2024-04-08: qty 4
  Filled 2024-04-08: qty 5
  Filled 2024-04-08: qty 3
  Filled 2024-04-08: qty 5
  Filled 2024-04-08: qty 8
  Filled 2024-04-08: qty 3
  Filled 2024-04-08 (×2): qty 5
  Filled 2024-04-08: qty 3
  Filled 2024-04-08: qty 11
  Filled 2024-04-08: qty 3
  Filled 2024-04-08 (×2): qty 2
  Filled 2024-04-08: qty 3
  Filled 2024-04-08: qty 8
  Filled 2024-04-08 (×7): qty 3
  Filled 2024-04-08: qty 2

## 2024-04-08 MED ORDER — FUROSEMIDE 20 MG PO TABS
20.0000 mg | ORAL_TABLET | Freq: Every day | ORAL | Status: DC
  Administered 2024-04-08 – 2024-04-09 (×2): 20 mg via ORAL
  Filled 2024-04-08 (×2): qty 1

## 2024-04-08 NOTE — Progress Notes (Signed)
  Echocardiogram 2D Echocardiogram has been performed.  Norleen ORN Darcell Yacoub 04/08/2024, 2:29 PM

## 2024-04-08 NOTE — Progress Notes (Signed)
 PROGRESS NOTE Luis [name]  FMW:[id number] DOB: 07-21-1940 DOA: 04/07/2024 PCP: [name], MD  Brief Narrative/Hospital Course: Luis [name] is a 83 y.o. male with PMH of  metastatic colon cancer, mets to lungs, liver, LN's, sarcoidosis, COPD, CAD hx PCI, HFpEF, hypertension, hyperlipidemia, hypothyroidism, who presented with progressive shortness of breath onset x 1 wk  with  exertional dyspnea, chest congestion, minimally productive cough with clear and scant blood-tinged sputum, seen by pulmonology and was started on Augmentin  on Tuesday but by Thursday his symptoms started worsening and progressively worsened over the weekend.  He ended up checking his pulse oximeter at home which was reading in the high 60s so he presented to the ED  In the ED tachypneic, hypoxic,labs-mag low 1.5 hyperglycemic 291 lactic acidosis 3.3 Pro-Cal 1.5 leukocytosis 14.5, A1c 7.1, high sensitive troponin 29 proBNP 249 EKG sinus rhythm LAD LVH prolonged Qtc, situational up to 75% room air given 4 L nasal cannula Chest x-ray>> right lung base increased patchy airspace opacities and new left infrahilar airspace opacities concerning for multifocal pneumonia small right pleural effusion CT chest>> Postsurgical and radiation changes in the right lower lobe. Multifocal ground glass opacity with mosaic attenuation in the left lung, favoring mild multifocal infection/pneumonia, although pneumonitis related to immunotherapy is also possible.  Stable underlying sequela of sarcoidosis.  Patient admitted for acute hypoxic respiratory failure with concern for multifocal pneumonia in the setting of metastatic colon cancer, on immunotherapy  Subjective: Seen and examined On 4 L nasal cannula and cut back to 3-but oxygen saturation dropped to 87% back on 4 L Overnight afebrile BP stable on 3 L nasal cannula Labs reviewed blood sugar much improved to 120s WBC still elevated 14K stable renal function Coughing more overall  feels somewhat better  Assessment and plan:  Acute hypoxic respiratory failure Possible multifocal pneumonia versus pneumonitis  Vs metastatic colon cancer related History of sarcoidosis: CT chest_ Multifocal ground glass opacity with mosaic attenuation in the left lung, favoring mild multifocal infection/pneumonia, although pneumonitis related to immunotherapy is also possible Blood culture from admission NGTD. Reports being on steroid for last 2 weeks 20 mgof prednisone  Overall slow to improve still hypoxic needing 4 L nasal cannula. Continue supplemental oxygen and wean as tolerated.He will likely need home oxygen upon discharge. Continue ceftriaxone  azithromycin ,bronchodilators I-S OOB. On Dupixent , Breztri , montelukast  PTA Of note patient is already on anticoagulant Eliquis and has not chest pain. Obtain echocardiogram to evaluate further   Elevated troponin: suspect demand ischemia with flat troponin  Hypomagnesemia: Resolved.    Lactic acidosis: Downtrended following IV fluid hydration.  Likely multifactorial-volume depletion hyperglycemia  New onset type 2 diabetes with uncontrolled hyperglycemia: A1c at 7.1.  Blood sugar up to 400-diet changed to carb controlled diet, will start glipizide and metformin keep on SSI q. ACHS and stop Lantus. DM coordinator consulted Recent Labs  Lab 04/07/24 0442 04/07/24 0800 04/07/24 1701 04/07/24 2205 04/08/24 0035 04/08/24 0453 04/08/24 0809  GLUCAP  --    < > 302* 197* 125* 126* 105*  HGBA1C 7.1*  --   --   --   --   --   --    < > = values in this interval not displayed.    Metastatic colon cancer, R hemicolectomy, mets to lungs, liver, LN's: Followed by [facility], has known RUL nodule and opacities in the R base ? Lymphangitic spread v. Inflammation/infection (last in 6/'25). He is currently on maintenance 5FU and Panitumumab  every 2 weeks.  Sarcoidosis: Follows with pulmonology, appears that he is currently on chronic  steroids with prednisone  at a dose of 15 mg daily   PHTN: Suggested on imaging, noted   HTN HLD CAD hx PCI: On DOAC, continue home rosuvastatin , zetia ,  Ranolazine    Aflutter: Rate controlled continue Apixaban, Dronedarone    HFpEF Chronic leg edema: Continue home lasix .  Reports he has a chronic leg edema  Aortic ectasia: OP surveillance   Hypothyroidism: Continue home levothyroxine    Chronic anemia:  Stable at baseline has worsening macrocytosis recently.  Normal B12 and folate.  Thrombocytopenia: Monitor platelet count, question if from chemo  ?Chronic pain: Continue home Gabapentin    ?rosacea: continue home Minocycline.   Class  I Obesity w/ Body mass index is 30.62 kg/m.: will benefit with PCP follow-up, weight loss,healthy lifestyle and outpatient sleep eval if not done.  Mobility: PT OT requested PT Orders: Active  PT Follow up Rec:     DVT prophylaxis:  Code Status:   Code Status: Prior Family Communication: plan of care discussed with patient at bedside. Patient status is: Remains hospitalized because of severity of illness Level of care: Telemetry Medical   Dispo: The patient is from: home            Anticipated disposition: TBD Objective: Vitals last 24 hrs: Vitals:   04/07/24 2214 04/08/24 0132 04/08/24 0504 04/08/24 0834  BP: (!) 126/94  (!) 161/74   Pulse: 65  74 72  Resp: 19  18 19   Temp: 97.7 F (36.5 C)  97.6 F (36.4 C)   TempSrc: Oral  Oral   SpO2: 92% 92% 93% (!) 87%  Weight:      Height:        Physical Examination: General exam: alert awake HEENT:Oral mucosa moist, Ear/Nose WNL grossly Respiratory system: Bilaterally clear BS-diminished at the bases,no use of accessory muscle Cardiovascular system: S1 & S2 +, No JVD. Gastrointestinal system: Abdomen soft,NT,ND, BS+ Nervous System: Alert, awake, moving all extremities,and following commands. Extremities: extremities warm, leg edema  ++ (chronic per him) Skin: Warm, no  rashes MSK: Normal muscle bulk,tone, power   Medications reviewed:  Scheduled Meds:  apixaban  5 mg Oral BID   azithromycin   500 mg Oral Daily   budesonide -glycopyrrolate-formoterol  2 puff Inhalation BID   dronedarone   400 mg Oral BID WC   ezetimibe   10 mg Oral Daily   furosemide   20 mg Oral Daily   gabapentin   200 mg Oral QHS   [START ON 04/09/2024] glipiZIDE  2.5 mg Oral QAC breakfast   guaiFENesin   600 mg Oral BID   insulin aspart  0-15 Units Subcutaneous TID WC   ipratropium-albuterol   3 mL Nebulization Q6H   levothyroxine   50 mcg Oral Q0600   metFORMIN  500 mg Oral BID WC   minocycline  100 mg Oral Daily   montelukast   10 mg Oral QHS   predniSONE   40 mg Oral Q breakfast   ranolazine   500 mg Oral BID   rosuvastatin   20 mg Oral Daily   Continuous Infusions:  cefTRIAXone  (ROCEPHIN )  IV 1 g (04/08/24 0010)   Diet: Diet Order             Diet Carb Modified Fluid consistency: Thin; Room service appropriate? Yes  Diet effective now                    Data Reviewed: I have personally reviewed following labs and imaging studies ( see epic result tab)  CBC: Recent Labs  Lab 04/07/24 0036 04/07/24 0442 04/08/24 0233  WBC 14.5* 14.5* 14.5*  HGB 11.9* 11.1* 11.1*  HCT 38.1* 35.3* 34.6*  MCV 102.4* 99.2 99.1  PLT 144* 125* 108*   CMP: Recent Labs  Lab 04/07/24 0036 04/07/24 0442 04/08/24 0233  NA 135 133* 133*  K 5.5* 4.2 4.1  CL 98 99 98  CO2 19* 20* 23  GLUCOSE 262* 291* 113*  BUN 14 13 19   CREATININE 0.89 0.79 0.79  CALCIUM  8.5* 8.1* 8.4*  MG  --  1.5* 1.8  PHOS  --  2.7  --    GFR: Estimated Creatinine Clearance: 84.1 mL/min (by C-G formula based on SCr of 0.79 mg/dL). No results for input(s): AST, ALT, ALKPHOS, BILITOT, PROT, ALBUMIN in the last 168 hours. No results for input(s): LIPASE, AMYLASE in the last 168 hours. No results for input(s): AMMONIA in the last 168 hours. Coagulation Profile:  Recent Labs  Lab 04/07/24 0036   INR 1.3*   Unresulted Labs (From admission, onward)     Start     Ordered   04/08/24 0500  Basic metabolic panel with GFR  Daily,   R      04/07/24 0737   04/08/24 0500  CBC  Daily,   R      04/07/24 0737   04/07/24 0238  Expectorated Sputum Assessment w Gram Stain, Rflx to Resp Cult  Once,   R        04/07/24 0238           Antimicrobials/Microbiology: Anti-infectives (From admission, onward)    Start     Dose/Rate Route Frequency Ordered Stop   04/08/24 1000  azithromycin  (ZITHROMAX ) tablet 500 mg        500 mg Oral Daily 04/07/24 0238 04/10/24 0959   04/08/24 0000  cefTRIAXone  (ROCEPHIN ) 1 g in sodium chloride  0.9 % 100 mL IVPB        1 g 200 mL/hr over 30 Minutes Intravenous Every 24 hours 04/07/24 0238 04/11/24 2359   04/07/24 1000  minocycline (MINOCIN) capsule 100 mg        100 mg Oral Daily 04/07/24 0241     04/07/24 0130  cefTRIAXone  (ROCEPHIN ) 2 g in sodium chloride  0.9 % 100 mL IVPB        2 g 200 mL/hr over 30 Minutes Intravenous  Once 04/07/24 0124 04/07/24 0255   04/07/24 0130  azithromycin  (ZITHROMAX ) 500 mg in sodium chloride  0.9 % 250 mL IVPB        500 mg 250 mL/hr over 60 Minutes Intravenous  Once 04/07/24 0124 04/07/24 0337         Component Value Date/Time   SDES BLOOD SITE NOT SPECIFIED 04/07/2024 0153   SPECREQUEST  04/07/2024 0153    BOTTLES DRAWN AEROBIC AND ANAEROBIC Blood Culture adequate volume   CULT  04/07/2024 0153    NO GROWTH 1 DAY Performed at [facility], [address], [place], [name] [address]    REPTSTATUS PENDING 04/07/2024 9846    Procedures:    Mennie LAMY, MD [facility] 04/08/2024, 10:32 AM

## 2024-04-08 NOTE — Plan of Care (Signed)
  Problem: Education: Goal: Ability to describe self-care measures that may prevent or decrease complications (Diabetes Survival Skills Education) will improve Outcome: Progressing   Problem: Coping: Goal: Ability to adjust to condition or change in health will improve Outcome: Progressing

## 2024-04-08 NOTE — TOC Initial Note (Signed)
 Transition of Care (TOC) - Initial/Assessment Note   Spoke to patient and wife at bedside. Patient from home. Has a walker , but was not needing to use it prior to admission.   PCP Maura Hamrick  Patient currently on oxygen, does not have oxygen at home. If needed will need order and ambulation oxygen saturation . If needed portal oxygen will be delivered to bedside prior to discharge and home concentrator will be delivered to home. Patient and wife voiced understanding. Inpatient Care Management Team will continue to follow.   Patient Details  Name: Luis [name] MRN: [id number] Date of Birth: 1941/04/04  Transition of Care [facility]) CM/SW Contact:    Stephane Powell Jansky, RN Phone Number: 04/08/2024, 12:35 PM  Clinical Narrative:                   Expected Discharge Plan: Home/Self Care Barriers to Discharge: Continued Medical Work up   Patient Goals and CMS Choice Patient states their goals for this hospitalization and ongoing recovery are:: to return to home          Expected Discharge Plan and Services   Discharge Planning Services: CM Consult Post Acute Care Choice: NA Living arrangements for the past 2 months: Single Family Home                 DME Arranged:  (see note)         HH Arranged: NA          Prior Living Arrangements/Services Living arrangements for the past 2 months: Single Family Home Lives with:: Spouse Patient language and need for interpreter reviewed:: Yes Do you feel safe going back to the place where you live?: Yes      Need for Family Participation in Patient Care: Yes (Comment) Care giver support system in place?: Yes (comment) Current home services: DME Criminal Activity/Legal Involvement Pertinent to Current Situation/Hospitalization: No - Comment as needed  Activities of Daily Living   ADL Screening (condition at time of admission) Independently performs ADLs?: Yes (appropriate for developmental age) Is the patient deaf or have  difficulty hearing?: No Does the patient have difficulty seeing, even when wearing glasses/contacts?: No Does the patient have difficulty concentrating, remembering, or making decisions?: No  Permission Sought/Granted   Permission granted to share information with : Yes, Verbal Permission Granted  Share Information with NAME: wife Josie Sink           Emotional Assessment Appearance:: Appears stated age Attitude/Demeanor/Rapport: Engaged Affect (typically observed): Appropriate Orientation: : Oriented to Self, Oriented to Place, Oriented to  Time, Oriented to Situation Alcohol / Substance Use: Not Applicable Psych Involvement: No (comment)  Admission diagnosis:  Acute respiratory failure with hypoxia (HCC) [J96.01] Multifocal pneumonia [J18.8] Acute hypoxic respiratory failure (HCC) [J96.01] Patient Active Problem List   Diagnosis Date Noted   Acute hypoxic respiratory failure (HCC) 04/07/2024   Typical atrial flutter (HCC) 02/07/2024   Acute hypoxemic respiratory failure (HCC) 08/25/2023   Acute respiratory failure with hypoxia (HCC) 08/25/2023   Heart failure with reduced ejection fraction (HCC) 08/25/2023   Colon cancer (HCC) 08/25/2023   Hypothyroidism 08/25/2023   Acute exacerbation of COPD with asthma (HCC) 08/28/2022   Pleural effusion 11/28/2021   Fall 05/28/2021   S/P thoracentesis    Hematuria 07/28/2020   Adenocarcinoma (HCC)    Smoker 02/04/2020   Malignant neoplasm metastatic to liver (HCC) 08/26/2019   Port-A-Cath in place 03/26/2019   Goals of care, counseling/discussion 02/25/2019  Carotid stenosis 01/07/2019   Cecal cancer (HCC) 07/26/2017   Adenocarcinoma of cecum (HCC) 06/21/2017   Iron deficiency anemia due to chronic blood loss 06/21/2017   DOE (dyspnea on exertion) 03/30/2015   Obesity (BMI 30-39.9) 03/30/2015   Trifascicular block    Chest pain 02/24/2015   CAD (coronary artery disease) 08/13/2014   Essential hypertension 08/13/2014    Hyperlipidemia 08/13/2014   Pulmonary fibrosis (HCC) 08/13/2014   Sarcoidosis    Syncope 01/29/2012   PCP:  [name], MD Pharmacy:   [facility] [phone] [place], [place] - [address] AT [facility] [address] [name] [address] Phone: [phone] Fax: [phone]  [facility] - [facility] [address] [name] [address] Phone: [phone] Fax: [phone]  [place] [facility] [address], [address] [name] [address] Phone: [phone] Fax: [phone]     Social Drivers of Health (SDOH) Social History: SDOH Screenings   Food Insecurity: No Food Insecurity (04/07/2024)  Housing: Low Risk  (04/07/2024)  Transportation Needs: No Transportation Needs (04/07/2024)  Utilities: Not At Risk (04/07/2024)  Depression (PHQ2-9): Low Risk  (03/21/2024)  Social Connections: Socially Integrated (04/07/2024)  Tobacco Use: Medium Risk (04/07/2024)   SDOH Interventions:     Readmission Risk Interventions     No data to display

## 2024-04-08 NOTE — Evaluation (Signed)
 Physical Therapy Evaluation Patient Details Name: Luis [name] MRN: [id number] DOB: 1941/01/18 Today's Date: 04/08/2024  History of Present Illness  83 y.o. male presents to [facility] hospital on 04/07/2024 with progressive SOB. PMH includes metastatic colon CA with mets to lungs, liver, and lymph nodes, sarcoidosis, COPD, CAD, HFpEF, HTN, HLD, hypothyroidism.  Clinical Impression  Pt presents to PT with deficits in pulmonary function, endurance, gait, strength. Pt is able to ambulate for short household distances, requiring frequent standing breaks due to dyspnea. PT is unable to obtain a consistently reliable pulse ox reading during session on portable pulse ox and dynamap. Pt does take multiple minutes for respiratory rate to recover to baseline after ambulation and PT notes intermittent dyspnea with conversation when resting. Pt will benefit from frequent mobilization in an effort to improve activity tolerance. Mobility specialist team has been contacted to add this patient to their caseload. Outpatient pulmonary rehab would be beneficial if this pt qualifies.      If plan is discharge home, recommend the following: Assistance with cooking/housework;Assist for transportation;Help with stairs or ramp for entrance   Can travel by private vehicle        Equipment Recommendations None recommended by PT  Recommendations for Other Services       Functional Status Assessment Patient has had a recent decline in their functional status and demonstrates the ability to make significant improvements in function in a reasonable and predictable amount of time.     Precautions / Restrictions Precautions Precautions: Fall Recall of Precautions/Restrictions: Intact Precaution/Restrictions Comments: monitor SpO2, will need sticker pleth as portable pulse ox and dynamap both would not record a consistently accurate reading Restrictions Weight Bearing Restrictions Per Provider Order: No       Mobility  Bed Mobility                    Transfers Overall transfer level: Needs assistance Equipment used: None Transfers: Sit to/from Stand Sit to Stand: Supervision                Ambulation/Gait Ambulation/Gait assistance: Supervision Gait Distance (Feet): 140 Feet (3 brief standing rest breaks during ambulation bout) Assistive device:  (pt pushing dynamap) Gait Pattern/deviations: Step-through pattern, Wide base of support Gait velocity: reduced Gait velocity interpretation: <1.8 ft/sec, indicate of risk for recurrent falls   General Gait Details: pt with slowed step-through gait  Stairs            Wheelchair Mobility     Tilt Bed    Modified Rankin (Stroke Patients Only)       Balance Overall balance assessment: Needs assistance Sitting-balance support: No upper extremity supported, Feet supported Sitting balance-Leahy Scale: Good     Standing balance support: No upper extremity supported, During functional activity Standing balance-Leahy Scale: Fair                               Pertinent Vitals/Pain Pain Assessment Pain Assessment: No/denies pain    Home Living Family/patient expects to be discharged to:: Private residence Living Arrangements: Spouse/significant other Available Help at Discharge: Family;Available 24 hours/day Type of Home: House Home Access: Stairs to enter Entrance Stairs-Rails: Right Entrance Stairs-Number of Steps: 5   Home Layout: One level Home Equipment: Rollator (4 wheels)      Prior Function Prior Level of Function : Independent/Modified Independent             Mobility Comments: ambulatory  without DME in the home, PRN use of rollator in the community and yard       Extremity/Trunk Assessment   Upper Extremity Assessment Upper Extremity Assessment: Overall WFL for tasks assessed    Lower Extremity Assessment Lower Extremity Assessment: Generalized weakness    Cervical  / Trunk Assessment Cervical / Trunk Assessment: Normal  Communication   Communication Communication: No apparent difficulties    Cognition Arousal: Alert Behavior During Therapy: WFL for tasks assessed/performed   PT - Cognitive impairments: No apparent impairments                         Following commands: Intact       Cueing Cueing Techniques: Verbal cues     General Comments General comments (skin integrity, edema, etc.): pt on 3L [place] upon PT arrival, SpO2 appears to be in mid 80s although readings from portable pulse ox and dynamap both are inconsistently reliable. Pt ambulates on 4L [place] with sats randing from 84%-90%. Pt takes multiple minutes to recover once seated. Pt does appear to become dyspneic and has frequent coughing bouts with conversation throughout session    Exercises     Assessment/Plan    PT Assessment Patient needs continued PT services  PT Problem List Decreased activity tolerance;Decreased strength;Decreased balance;Decreased mobility;Cardiopulmonary status limiting activity       PT Treatment Interventions DME instruction;Gait training;Stair training;Functional mobility training;Therapeutic activities;Therapeutic exercise;Balance training;Neuromuscular re-education;Patient/family education    PT Goals (Current goals can be found in the Care Plan section)  Acute Rehab PT Goals Patient Stated Goal: to improve activity tolerance PT Goal Formulation: With patient/family Time For Goal Achievement: 04/22/24 Potential to Achieve Goals: Fair Additional Goals Additional Goal #1: Pt will score >19/24 on the DGI to indicate a reduced risk for falls Additional Goal #2: Pt will report 1/4 DOE or less when ambulating for >125' to improved activity tolerance    Frequency Min 2X/week     Co-evaluation               AM-PAC PT 6 Clicks Mobility  Outcome Measure Help needed turning from your back to your side while in a flat bed without using  bedrails?: A Little Help needed moving from lying on your back to sitting on the side of a flat bed without using bedrails?: A Little Help needed moving to and from a bed to a chair (including a wheelchair)?: A Little Help needed standing up from a chair using your arms (e.g., wheelchair or bedside chair)?: A Little Help needed to walk in hospital room?: A Little Help needed climbing 3-5 steps with a railing? : A Little 6 Click Score: 18    End of Session Equipment Utilized During Treatment: Oxygen Activity Tolerance: Patient limited by fatigue (DOE) Patient left: in bed;with call bell/phone within reach Nurse Communication: Mobility status PT Visit Diagnosis: Other abnormalities of gait and mobility (R26.89)    Time: 1052-1130 PT Time Calculation (min) (ACUTE ONLY): 38 min   Charges:   PT Evaluation $PT Eval Low Complexity: 1 Low   PT General Charges $$ ACUTE PT VISIT: 1 Visit         Bernardino JINNY Ruth, PT, DPT Acute Rehabilitation Office [phone]   Bernardino JINNY Ruth 04/08/2024, 12:45 PM

## 2024-04-09 DIAGNOSIS — J9601 Acute respiratory failure with hypoxia: Secondary | ICD-10-CM | POA: Diagnosis not present

## 2024-04-09 LAB — BASIC METABOLIC PANEL WITH GFR
Anion gap: 15 (ref 5–15)
BUN: 18 mg/dL (ref 8–23)
CO2: 18 mmol/L — ABNORMAL LOW (ref 22–32)
Calcium: 8.3 mg/dL — ABNORMAL LOW (ref 8.9–10.3)
Chloride: 101 mmol/L (ref 98–111)
Creatinine, Ser: 0.79 mg/dL (ref 0.61–1.24)
GFR, Estimated: >60 mL/min (ref >60)
Glucose, Bld: 148 mg/dL — ABNORMAL HIGH (ref 70–99)
Potassium: 4.1 mmol/L (ref 3.5–5.1)
Sodium: 134 mmol/L — ABNORMAL LOW (ref 135–145)

## 2024-04-09 LAB — GLUCOSE, CAPILLARY
Glucose-Capillary: 145 mg/dL — ABNORMAL HIGH (ref 70–99)
Glucose-Capillary: 187 mg/dL — ABNORMAL HIGH (ref 70–99)
Glucose-Capillary: 270 mg/dL — ABNORMAL HIGH (ref 70–99)
Glucose-Capillary: 111 mg/dL — ABNORMAL HIGH (ref 70–99)

## 2024-04-09 LAB — CBC
HCT: 37.8 % — ABNORMAL LOW (ref 39.0–52.0)
Hemoglobin: 12.1 g/dL — ABNORMAL LOW (ref 13.0–17.0)
MCH: 31.6 pg (ref 26.0–34.0)
MCHC: 32 g/dL (ref 30.0–36.0)
MCV: 98.7 fL (ref 80.0–100.0)
Platelets: 149 K/uL — ABNORMAL LOW (ref 150–400)
RBC: 3.83 MIL/uL — ABNORMAL LOW (ref 4.22–5.81)
RDW: 17.4 % — ABNORMAL HIGH (ref 11.5–15.5)
WBC: 15.1 K/uL — ABNORMAL HIGH (ref 4.0–10.5)
nRBC: 0.2 % (ref 0.0–0.2)

## 2024-04-09 LAB — BRAIN NATRIURETIC PEPTIDE: B Natriuretic Peptide: 237.9 pg/mL — ABNORMAL HIGH (ref 0.0–100.0)

## 2024-04-09 MED ORDER — FUROSEMIDE 10 MG/ML IJ SOLN
40.0000 mg | Freq: Two times a day (BID) | INTRAMUSCULAR | Status: DC
  Administered 2024-04-09 – 2024-04-14 (×11): 40 mg via INTRAVENOUS
  Filled 2024-04-09 (×11): qty 4

## 2024-04-09 NOTE — Evaluation (Signed)
 Occupational Therapy Evaluation Patient Details Name: Luis [name] MRN: [id number] DOB: Jan 10, 1941 Today's Date: 04/09/2024   History of Present Illness   83 y.o. male presents to [facility] hospital on 04/07/2024 with progressive SOB. PMH includes metastatic colon CA with mets to lungs, liver, and lymph nodes, sarcoidosis, COPD, CAD, HFpEF, HTN, HLD, hypothyroidism.     Clinical Impressions Patient reports that he lives with his wife in a 1 level home with 5 STE is Independent for ADLs and yard work with use for taking rollator with him so he can sit and rest as needed and wife assists with other IADLs. Patient completed sit to stand from Eob with Supervision and requires CGA for transfer to [facility] for spongebathing task.  Patient's O2 rangling from 85%92 ()taken from toe) on 02.  Patient would benefit from additional OT intervention to address functional deficits of ADLs, activity tolerance, safety, fall prevention, EC/WS techniques and UE strength.  OT will continue to follow acutely and will recommend HHOT to address any remaining deficits and to ensure a safe transition back home.     If plan is discharge home, recommend the following:   A little help with walking and/or transfers;Direct supervision/assist for medications management;Assist for transportation;Help with stairs or ramp for entrance;Assistance with cooking/housework;A little help with bathing/dressing/bathroom     Functional Status Assessment   Patient has had a recent decline in their functional status and demonstrates the ability to make significant improvements in function in a reasonable and predictable amount of time.     Equipment Recommendations   BSC/3in1;Tub/shower seat     Recommendations for Other Services         Precautions/Restrictions   Precautions Precautions: Fall Recall of Precautions/Restrictions: Intact Precaution/Restrictions Comments: monitor SpO2, will need sticker pleth as portable  pulse ox and dynamap both would not record a consistently accurate reading Restrictions Weight Bearing Restrictions Per Provider Order: No     Mobility Bed Mobility Overal bed mobility: Needs Assistance (patient sitting EOB upon arrival into room)                  Transfers Overall transfer level: Needs assistance Equipment used: None Transfers: Sit to/from Stand Sit to Stand: Supervision                  Balance Overall balance assessment: Needs assistance Sitting-balance support: No upper extremity supported, Feet supported Sitting balance-Leahy Scale: Good     Standing balance support: No upper extremity supported, During functional activity                               ADL either performed or assessed with clinical judgement   ADL Overall ADL's : Needs assistance/impaired Eating/Feeding: Independent;Sitting   Grooming: Wash/dry hands;Wash/dry face;Sitting;Set up   Upper Body Bathing: Set up;Sitting   Lower Body Bathing: Moderate assistance   Upper Body Dressing : Set up;Sitting   Lower Body Dressing: Moderate assistance   Toilet Transfer: Contact guard assist   Toileting- Clothing Manipulation and Hygiene: Contact guard assist       Functional mobility during ADLs: Contact guard assist       Vision Baseline Vision/History: 1 Wears glasses Ability to See in Adequate Light: 0 Adequate Patient Visual Report: No change from baseline Vision Assessment?: No apparent visual deficits     Perception Perception: Within Functional Limits       Praxis Praxis: [facility]  Pertinent Vitals/Pain Pain Assessment Pain Assessment: No/denies pain     Extremity/Trunk Assessment Upper Extremity Assessment Upper Extremity Assessment: Generalized weakness   Lower Extremity Assessment Lower Extremity Assessment: Defer to PT evaluation   Cervical / Trunk Assessment Cervical / Trunk Assessment: Normal   Communication  Communication Communication: No apparent difficulties   Cognition Arousal: Alert Behavior During Therapy: WFL for tasks assessed/performed Cognition: No apparent impairments                               Following commands: Intact       Cueing  General Comments   Cueing Techniques: Verbal cues      Exercises     Shoulder Instructions      Home Living Family/patient expects to be discharged to:: Private residence Living Arrangements: Spouse/significant other Available Help at Discharge: Family;Available 24 hours/day Type of Home: House Home Access: Stairs to enter [employer] of Steps: 5 Entrance Stairs-Rails: Right Home Layout: One level     Bathroom Shower/Tub: Producer, Television/film/video: Standard     Home Equipment: Rollator (4 wheels)          Prior Functioning/Environment Prior Level of Function : Independent/Modified Independent             Mobility Comments: ambulatory without DME in the home, PRN use of rollator in the community and yard      OT Problem List: Decreased strength;Cardiopulmonary status limiting activity   OT Treatment/Interventions: Self-care/ADL training;Therapeutic exercise;Neuromuscular education;Patient/family education;Therapeutic activities      OT Goals(Current goals can be found in the care plan section)   Acute Rehab OT Goals OT Goal Formulation: With patient Time For Goal Achievement: 04/23/24 Potential to Achieve Goals: Good ADL Goals Pt Will Perform Lower Body Bathing: with adaptive equipment;with set-up Pt Will Perform Lower Body Dressing: with min assist;with adaptive equipment Pt Will Transfer to Toilet: with supervision Pt Will Perform Toileting - Clothing Manipulation and hygiene: with supervision Additional ADL Goal #1: recall 2 EC/WS techniques with Supervision   OT Frequency:  Min 2X/week    Co-evaluation              AM-PAC OT 6 Clicks Daily Activity      Outcome Measure Help from another person eating meals?: None Help from another person taking care of personal grooming?: A Little Help from another person toileting, which includes using toliet, bedpan, or urinal?: A Little Help from another person bathing (including washing, rinsing, drying)?: A Lot Help from another person to put on and taking off regular upper body clothing?: A Little Help from another person to put on and taking off regular lower body clothing?: A Lot 6 Click Score: 17   End of Session Nurse Communication: Mobility status  Activity Tolerance: Patient limited by fatigue (2/2 SOB) Patient left: in bed;with call bell/phone within reach  OT Visit Diagnosis: Muscle weakness (generalized) (M62.81)                Time: 1430-1500 OT Time Calculation (min): 30 min Charges:  OT General Charges $OT Visit: 1 Visit OT Evaluation $OT Eval Moderate Complexity: 1 Mod OT Treatments $Self Care/Home Management : 8-22 mins  Lamarr Pouch OT/L  Lamarr JONETTA Pouch 04/09/2024, 5:00 PM

## 2024-04-09 NOTE — Plan of Care (Signed)
?  Problem: Clinical Measurements: ?Goal: Respiratory complications will improve ?Outcome: Progressing ?  ?Problem: Activity: ?Goal: Risk for activity intolerance will decrease ?Outcome: Progressing ?  ?Problem: Nutrition: ?Goal: Adequate nutrition will be maintained ?Outcome: Progressing ?  ?Problem: Coping: ?Goal: Level of anxiety will decrease ?Outcome: Progressing ?  ?

## 2024-04-09 NOTE — Inpatient Diabetes Management (Signed)
 Inpatient Diabetes Program Recommendations  AACE/ADA: New Consensus Statement on Inpatient Glycemic Control (2015)  Target Ranges:  Prepandial:   less than 140 mg/dL      Peak postprandial:   less than 180 mg/dL (1-2 hours)      Critically ill patients:  140 - 180 mg/dL   Lab Results  Component Value Date   GLUCAP 187 (H) 04/09/2024   HGBA1C 7.1 (H) 04/07/2024    Review of Glycemic Control  Latest Reference Range & Units 04/08/24 08:09 04/08/24 11:24 04/08/24 11:49 04/08/24 16:30 04/08/24 21:56 04/09/24 08:05 04/09/24 11:46  Glucose-Capillary 70 - 99 mg/dL 894 (H) 846 (H) 843 (H) 253 (H) 191 (H) 145 (H) 187 (H)  (H): Data is abnormally high  Diabetes history: DM2 Outpatient Diabetes medications: None Current orders for Inpatient glycemic control: Novolog 0-15 units TID, Glipizide 2.5 mg QAM, Metformin 500 mg BID, Prednisone  40 mg QAM  Received referral for uncontrolled hyperglycemia.  Agree with current regimen while inpatient.  Glucose trending well.  Will follow.  Thank you, Wyvonna Pinal, MSN, CDCES Diabetes Coordinator Inpatient Diabetes Program [phone] (team pager from 8a-5p)

## 2024-04-09 NOTE — Progress Notes (Signed)
 Mobility Specialist Progress Note:    04/09/24 1535  Mobility  Activity Ambulated with assistance (In hallway)  Level of Assistance Standby assist, set-up cues, supervision of patient - no hands on  Assistive Device None  Distance Ambulated (ft) 140 ft  Activity Response Tolerated well  Mobility Referral Yes  Mobility visit 1 Mobility  Mobility Specialist Start Time (ACUTE ONLY) 1350  Mobility Specialist Stop Time (ACUTE ONLY) 1409  Mobility Specialist Time Calculation (min) (ACUTE ONLY) 19 min   Received pt EOB and agreeable to mobility. Found pt on 6 L/min. Pt c/o SOB; unable to obtain a reliable pulse ox reading during session on portable pulse ox. Pt had x2 seated rest breaks d/t SOB. Returned to room without fault. Left pt EOB. Pt had a reliable pleth; SPO2 @ 91% on 6 L/min. Personal belongings and call light within reach. All needs met.  Lavanda Pollack Mobility Specialist  Please contact via [employer] or  Rehab Office [phone]

## 2024-04-09 NOTE — Progress Notes (Signed)
 PROGRESS NOTE Luis [name]  FMW:[id number] DOB: [birth year] DOA: 04/07/2024 PCP: [name], MD  Brief Narrative/Hospital Course: Luis [name] is a 83 y.o. male with PMH of  metastatic colon cancer, mets to lungs, liver, LN's, sarcoidosis, COPD, CAD hx PCI, HFpEF, hypertension, hyperlipidemia, hypothyroidism, who presented with progressive shortness of breath onset x 1 wk  with  exertional dyspnea, chest congestion, minimally productive cough with clear and scant blood-tinged sputum, seen by pulmonology and was started on Augmentin  on Tuesday but by Thursday his symptoms started worsening and progressively worsened over the weekend.  He ended up checking his pulse oximeter at home which was reading in the high 60s so he presented to the ED  In the ED tachypneic, hypoxic,labs-mag low 1.5 hyperglycemic 291 lactic acidosis 3.3 Pro-Cal 1.5 leukocytosis 14.5, A1c 7.1, high sensitive troponin 29 proBNP 249 EKG sinus rhythm LAD LVH prolonged Qtc, situational up to 75% room air given 4 L nasal cannula Chest x-ray>> right lung base increased patchy airspace opacities and new left infrahilar airspace opacities concerning for multifocal pneumonia small right pleural effusion CT chest>> Postsurgical and radiation changes in the right lower lobe. Multifocal ground glass opacity with mosaic attenuation in the left lung, favoring mild multifocal infection/pneumonia, although pneumonitis related to immunotherapy is also possible.  Stable underlying sequela of sarcoidosis.  Patient admitted for acute hypoxic respiratory failure with concern for multifocal pneumonia in the setting of metastatic colon cancer, on immunotherapy  Subjective: Seen and examined Overnight afebrile BP stable, he is on 5l [place] States he felt better with  ambulation w/ PT yesterday Wfe at bedside Legs still swollen, overall no significant worsening Reports his cough is loosening up  Assessment and plan:  Acute hypoxic  respiratory failure Possible multifocal pneumonia versus pneumonitis  Vs metastatic colon cancer related History of sarcoidosis: CT chest:Multifocal ground glass opacity with mosaic attenuation in the left lung, favoring mild multifocal infection/pneumonia, although pneumonitis related to immunotherapy is also possible Blood culture from admission NGTD. Reports being on steroid for last 2 weeks 20 mg of prednisone  Needing 4 L [place] Continue ceftriaxone  azithromycin ,bronchodilators I-S OOB.  On Dupixent , Breztri , montelukast  PTA Of note patient is already on anticoagulant Eliquis and less likelihood of PE. Echo obtained for completeness and has normal EF.  70 to 75% G1 DD RV SF is normal RV size is normal Does have edematous legs I will do IV Lasix  trial check BNP If does not improve despite Lasix  will request his pulmonary team.  He is known to Dr. Annella. Patient is agreeable with the plan of care  Acute on chronic HFpEF Chronic leg edema: Leg is still edematous given his hypoxia will change to IV Lasix  monitor daily weight.He reports his weight a week ago was 216 pound.  He has been intentionally losing weight before that he was 240. Cont to monitor daily I/O,weight, electrolytes and net balance as below.Keep on  salt/fluid restricted diet and monitor in tele. Net IO Since Admission: -80 mL [04/09/24 1127]  Filed Weights   04/07/24 0048  Weight: 99.6 kg    Recent Labs  Lab 04/07/24 0036 04/07/24 0442 04/08/24 0233 04/09/24 0510  BNP 249.3*  --   --   --   BUN 14 13 19 18   CREATININE 0.89 0.79 0.79 0.79  K 5.5* 4.2 4.1 4.1  MG  --  1.5* 1.8  --    Elevated troponin: suspect demand ischemia with flat troponin. TTE is unremarkable  Hypomagnesemia: Resolved.  Lactic acidosis: Likely in the setting of respiratory failure-volume depletion hyperglycemia  New onset type 2 diabetes with uncontrolled hyperglycemia: A1c at 7.1.  Blood sugar up to 400-diet changed to carb  controlled diet,started on glipizide and metformin ssi Blood sugar fairly controlled.  Suspect in the setting of his steroid use as well Recent Labs  Lab 04/07/24 0442 04/07/24 0800 04/08/24 1124 04/08/24 1149 04/08/24 1630 04/08/24 2156 04/09/24 0805  GLUCAP  --    < > 153* 156* 253* 191* 145*  HGBA1C 7.1*  --   --   --   --   --   --    < > = values in this interval not displayed.    Metastatic colon cancer, R hemicolectomy, mets to lungs, liver, LN's: Followed by [facility], has known RUL nodule and opacities in the R base ? Lymphangitic spread v. Inflammation/infection (last in 6/'25). He is currently on maintenance 5FU and Panitumumab  every 2 weeks.  Sarcoidosis: Follows with pulmonology, appears that he is currently on chronic steroids with prednisone  at a dose of 15 mg daily   PHTN: Suggested on imaging, noted   HTN HLD CAD hx PCI: On DOAC, continue home rosuvastatin , zetia ,  Ranolazine    Aflutter: Rate controlled continue Apixaban, Dronedarone    Aortic ectasia: OP surveillance   Hypothyroidism: Continue home levothyroxine    Chronic anemia:  Stable at baseline has worsening macrocytosis recently.  Normal B12 and folate.  Thrombocytopenia: Monitor platelet count, question if from chemo  ?Chronic pain: Continue home Gabapentin    ?rosacea: continue home Minocycline.   Class  I Obesity w/ Body mass index is 30.62 kg/m.: will benefit with PCP follow-up, weight loss,healthy lifestyle and outpatient sleep eval if not done.  Mobility: PT OT requested PT Orders: Active  PT Follow up Rec: Outpatient Pt (Outpatient Pulmonary Rehab)04/08/2024 1130    DVT prophylaxis:  Code Status:   Code Status: Prior Family Communication: plan of care discussed with patient at bedside. Patient status is: Remains hospitalized because of severity of illness Level of care: Telemetry Medical   Dispo: The patient is from: home            Anticipated disposition:  TBD Objective: Vitals last 24 hrs: Vitals:   04/08/24 2110 04/08/24 2200 04/09/24 0500 04/09/24 0949  BP:  (!) 107/59 (!) 152/77 (!) 146/87  Pulse:  (!) 51 60   Resp:  17 18 17   Temp:  97.7 F (36.5 C) 97.9 F (36.6 C) 97.9 F (36.6 C)  TempSrc:  Oral Oral   SpO2: 95% 93%  (!) 88%  Weight:      Height:        Physical Examination: General exam: alert awake, pleasant, on 5l [place],  obese HEENT:Oral mucosa moist, Ear/Nose WNL grossly Respiratory system: Bilaterally clear BS,no use of accessory muscle Cardiovascular system: S1 & S2 +, No JVD. Gastrointestinal system: Abdomen soft,NT,ND, BS+ Nervous System: Alert, awake, moving all extremities,and following commands. Extremities: extremities warm, leg edema  ++  Skin: Warm, no rashes MSK: Normal muscle bulk,tone, power   Medications reviewed:  Scheduled Meds:  apixaban  5 mg Oral BID   budesonide -glycopyrrolate-formoterol  2 puff Inhalation BID   dronedarone   400 mg Oral BID WC   ezetimibe   10 mg Oral Daily   furosemide   40 mg Intravenous BID   gabapentin   200 mg Oral QHS   glipiZIDE  2.5 mg Oral QAC breakfast   guaiFENesin   600 mg Oral BID   insulin aspart  0-15  Units Subcutaneous TID WC   levothyroxine   50 mcg Oral Q0600   metFORMIN  500 mg Oral BID WC   minocycline  100 mg Oral Daily   montelukast   10 mg Oral QHS   predniSONE   40 mg Oral Q breakfast   ranolazine   500 mg Oral BID   rosuvastatin   20 mg Oral Daily   Continuous Infusions:  cefTRIAXone  (ROCEPHIN )  IV 1 g (04/09/24 0007)   Diet: Diet Order             Diet Carb Modified Fluid consistency: Thin; Room service appropriate? Yes  Diet effective now                    Data Reviewed: I have personally reviewed following labs and imaging studies ( see epic result tab) CBC: Recent Labs  Lab 04/07/24 0036 04/07/24 0442 04/08/24 0233 04/09/24 0816  WBC 14.5* 14.5* 14.5* 15.1*  HGB 11.9* 11.1* 11.1* 12.1*  HCT 38.1* 35.3* 34.6* 37.8*  MCV 102.4*  99.2 99.1 98.7  PLT 144* 125* 108* 149*   CMP: Recent Labs  Lab 04/07/24 0036 04/07/24 0442 04/08/24 0233 04/09/24 0510  NA 135 133* 133* 134*  K 5.5* 4.2 4.1 4.1  CL 98 99 98 101  CO2 19* 20* 23 18*  GLUCOSE 262* 291* 113* 148*  BUN 14 13 19 18   CREATININE 0.89 0.79 0.79 0.79  CALCIUM  8.5* 8.1* 8.4* 8.3*  MG  --  1.5* 1.8  --   PHOS  --  2.7  --   --    GFR: Estimated Creatinine Clearance: 84.1 mL/min (by C-G formula based on SCr of 0.79 mg/dL). No results for input(s): AST, ALT, ALKPHOS, BILITOT, PROT, ALBUMIN in the last 168 hours. No results for input(s): LIPASE, AMYLASE in the last 168 hours. No results for input(s): AMMONIA in the last 168 hours. Coagulation Profile:  Recent Labs  Lab 04/07/24 0036  INR 1.3*   Unresulted Labs (From admission, onward)     Start     Ordered   04/09/24 1126  Brain natriuretic peptide  Add-on,   AD        04/09/24 1125   04/08/24 0500  Basic metabolic panel with GFR  Daily,   R      04/07/24 0737   04/08/24 0500  CBC  Daily,   R      04/07/24 0737   04/07/24 0238  Expectorated Sputum Assessment w Gram Stain, Rflx to Resp Cult  Once,   R        04/07/24 0238           Antimicrobials/Microbiology: Anti-infectives (From admission, onward)    Start     Dose/Rate Route Frequency Ordered Stop   04/08/24 1000  azithromycin  (ZITHROMAX ) tablet 500 mg        500 mg Oral Daily 04/07/24 0238 04/09/24 0852   04/08/24 0000  cefTRIAXone  (ROCEPHIN ) 1 g in sodium chloride  0.9 % 100 mL IVPB        1 g 200 mL/hr over 30 Minutes Intravenous Every 24 hours 04/07/24 0238 04/11/24 2359   04/07/24 1000  minocycline (MINOCIN) capsule 100 mg        100 mg Oral Daily 04/07/24 0241     04/07/24 0130  cefTRIAXone  (ROCEPHIN ) 2 g in sodium chloride  0.9 % 100 mL IVPB        2 g 200 mL/hr over 30 Minutes Intravenous  Once 04/07/24 0124 04/07/24 0255  04/07/24 0130  azithromycin  (ZITHROMAX ) 500 mg in sodium chloride  0.9 % 250 mL IVPB         500 mg 250 mL/hr over 60 Minutes Intravenous  Once 04/07/24 0124 04/07/24 0337         Component Value Date/Time   SDES BLOOD SITE NOT SPECIFIED 04/07/2024 0153   SPECREQUEST  04/07/2024 0153    BOTTLES DRAWN AEROBIC AND ANAEROBIC Blood Culture adequate volume   CULT  04/07/2024 0153    NO GROWTH 2 DAYS Performed at [facility], [address] [place], [name] [address]    REPTSTATUS PENDING 04/07/2024 9846    Procedures:    Mennie LAMY, MD [facility] 04/09/2024, 11:27 AM

## 2024-04-10 DIAGNOSIS — J9601 Acute respiratory failure with hypoxia: Secondary | ICD-10-CM | POA: Diagnosis not present

## 2024-04-10 DIAGNOSIS — R918 Other nonspecific abnormal finding of lung field: Secondary | ICD-10-CM | POA: Diagnosis not present

## 2024-04-10 LAB — GLUCOSE, CAPILLARY
Glucose-Capillary: 106 mg/dL — ABNORMAL HIGH (ref 70–99)
Glucose-Capillary: 195 mg/dL — ABNORMAL HIGH (ref 70–99)
Glucose-Capillary: 341 mg/dL — ABNORMAL HIGH (ref 70–99)
Glucose-Capillary: 224 mg/dL — ABNORMAL HIGH (ref 70–99)

## 2024-04-10 LAB — EXPECTORATED SPUTUM ASSESSMENT W GRAM STAIN, RFLX TO RESP C

## 2024-04-10 LAB — BASIC METABOLIC PANEL WITH GFR
Anion gap: 14 (ref 5–15)
BUN: 19 mg/dL (ref 8–23)
CO2: 27 mmol/L (ref 22–32)
Calcium: 8.3 mg/dL — ABNORMAL LOW (ref 8.9–10.3)
Chloride: 96 mmol/L — ABNORMAL LOW (ref 98–111)
Creatinine, Ser: 0.87 mg/dL (ref 0.61–1.24)
GFR, Estimated: >60 mL/min (ref >60)
Glucose, Bld: 99 mg/dL (ref 70–99)
Potassium: 3.5 mmol/L (ref 3.5–5.1)
Sodium: 137 mmol/L (ref 135–145)

## 2024-04-10 LAB — CBC
HCT: 38.4 % — ABNORMAL LOW (ref 39.0–52.0)
Hemoglobin: 12.2 g/dL — ABNORMAL LOW (ref 13.0–17.0)
MCH: 31.1 pg (ref 26.0–34.0)
MCHC: 31.8 g/dL (ref 30.0–36.0)
MCV: 98 fL (ref 80.0–100.0)
Platelets: 131 K/uL — ABNORMAL LOW (ref 150–400)
RBC: 3.92 MIL/uL — ABNORMAL LOW (ref 4.22–5.81)
RDW: 17.6 % — ABNORMAL HIGH (ref 11.5–15.5)
WBC: 12.9 K/uL — ABNORMAL HIGH (ref 4.0–10.5)
nRBC: 0 % (ref 0.0–0.2)

## 2024-04-10 MED ORDER — PREDNISONE 20 MG PO TABS
40.0000 mg | ORAL_TABLET | Freq: Every day | ORAL | Status: AC
Start: 2024-04-11 — End: 2024-04-14
  Administered 2024-04-11 – 2024-04-14 (×4): 40 mg via ORAL
  Filled 2024-04-10 (×4): qty 2

## 2024-04-10 MED ORDER — ORAL CARE MOUTH RINSE
15.0000 mL | OROMUCOSAL | Status: DC | PRN
Start: 2024-04-10 — End: 2024-05-10

## 2024-04-10 NOTE — Progress Notes (Signed)
 Physical Therapy Treatment Patient Details Name: Luis [name] MRN: [id number] DOB: 1941/02/12 Today's Date: 04/10/2024   History of Present Illness 83 y.o. male presents to [facility] hospital on 04/07/2024 with progressive SOB. PMH includes metastatic colon CA with mets to lungs, liver, and lymph nodes, sarcoidosis, COPD, CAD, HFpEF, HTN, HLD, hypothyroidism.    PT Comments  Pt greeted seated in recliner chair, pleasant and agreeable to PT session. Introduced RW and educated pt on proper and safe use of AD. He demonstrated improved stability. Plan to trial a rollator next session to aid in energy conservation and allow pt to take a seated rest break during recovery. He continues to be limited by impaired cardiopulmonary endurance experiencing SOB and DOE 2/4 while on 4L O2 via [place]. Cued pt on PLB technique. PT was unable to obtain a consistently reliable pleth reading despite hand-held portable pulse oximeter with monitor attempted on pt's ear lobe and finger. RN notified and aware, reporting inconsistency is likely d/t poor perfusion. Provided pt with incentive spirometer, educating him on how to use device and instructing him to complete 10 reps every hour. Encouraged OOB<>chair 3x/day and frequent mobilization with staff assistance. Will continue to follow acutely and advance appropriately.      If plan is discharge home, recommend the following: Assistance with cooking/housework;Assist for transportation;Help with stairs or ramp for entrance   Can travel by private vehicle        Equipment Recommendations  None recommended by PT (Pt already has DME including RW and rollator)    Recommendations for Other Services       Precautions / Restrictions Precautions Precautions: Fall Recall of Precautions/Restrictions: Intact Precaution/Restrictions Comments: Monitor SpO2; unable to get a consistently accurate reading Restrictions Weight Bearing Restrictions Per Provider Order: No      Mobility  Bed Mobility               General bed mobility comments: Not assessed. Pt greeted in recliner chair and returned there at end of session.    Transfers Overall transfer level: Needs assistance Equipment used: Rolling walker (2 wheels) Transfers: Sit to/from Stand Sit to Stand: Supervision           General transfer comment: Pt stood from recliner chair. Cued proper hand placement using RW. Powered up without physical assist. Good eccentric control.    Ambulation/Gait Ambulation/Gait assistance: Contact guard assist Gait Distance (Feet): 60 Feet (x2, one standing rest break) Assistive device: Rolling walker (2 wheels) Gait Pattern/deviations: Step-through pattern, Wide base of support, Trunk flexed Gait velocity: reduced Gait velocity interpretation: <1.8 ft/sec, indicate of risk for recurrent falls   General Gait Details: Pt ambulated with slow steps. He maintain good proximity to RW. Even weight shift and good foot clearence. Pt was limited by SOB and DOE 2/4. Cued PLB technique. Pt took a standing rest break and opted to rest forearms on RW. Instructed pt to regain upright posture to aid in opening up the chest and facilitated return to pt's room.   Stairs             Wheelchair Mobility     Tilt Bed    Modified Rankin (Stroke Patients Only)       Balance Overall balance assessment: Needs assistance Sitting-balance support: No upper extremity supported, Feet supported Sitting balance-Leahy Scale: Good     Standing balance support: Bilateral upper extremity supported, During functional activity, Reliant on assistive device for balance Standing balance-Leahy Scale: Poor Standing balance comment: Pt dependent on  RW                            Communication Communication Communication: No apparent difficulties  Cognition Arousal: Alert Behavior During Therapy: WFL for tasks assessed/performed   PT - Cognitive impairments: No  apparent impairments                         Following commands: Intact      Cueing Cueing Techniques: Verbal cues  Exercises Other Exercises Other Exercises: Incentive Spirometer: educated pt on proper technique. He completed 10 reps reaching 1,059mL most consistently. Set the goal for 1,273mL.    General Comments General comments (skin integrity, edema, etc.): Pt greeted on 4L O2 via [place]. RN assisted PT in gathering a handheld portable pulse oximeter with line that connects to box and applied sticky around pt's finger vs. earlob. SpO2 appeared to be in the low 80s. During ambulation it dropped into the 60/70s, but would significantly fluctuate. Pt takes multiple minutes to recover with PLB technique. He had an intermittent productive cough. Upon returning to room pt took inhaler in RN's presence. SpO2 89% on 4L. (Dynamap and small finger-sized portable pulse ox utilized previously with incosistently reliable readings.)      Pertinent Vitals/Pain Pain Assessment Pain Assessment: Faces Faces Pain Scale: Hurts a little bit Pain Location: Chest Pain Descriptors / Indicators: Pressure, Tightness Pain Intervention(s): Monitored during session, Limited activity within patient's tolerance, Repositioned    Home Living                          Prior Function            PT Goals (current goals can now be found in the care plan section) Acute Rehab PT Goals Patient Stated Goal: Improve activity tolerance and not need oxygen PT Goal Formulation: With patient/family Time For Goal Achievement: 04/22/24 Potential to Achieve Goals: Fair Progress towards PT goals: Progressing toward goals    Frequency    Min 2X/week      PT Plan      Co-evaluation              AM-PAC PT 6 Clicks Mobility   Outcome Measure  Help needed turning from your back to your side while in a flat bed without using bedrails?: A Little Help needed moving from lying on your back to  sitting on the side of a flat bed without using bedrails?: A Little Help needed moving to and from a bed to a chair (including a wheelchair)?: A Little Help needed standing up from a chair using your arms (e.g., wheelchair or bedside chair)?: A Little Help needed to walk in hospital room?: A Little Help needed climbing 3-5 steps with a railing? : A Little 6 Click Score: 18    End of Session Equipment Utilized During Treatment: Gait belt;Oxygen Activity Tolerance: Patient limited by fatigue Patient left: in chair;with call bell/phone within reach Nurse Communication: Mobility status PT Visit Diagnosis: Other abnormalities of gait and mobility (R26.89)     Time: 9199-9167 PT Time Calculation (min) (ACUTE ONLY): 32 min  Charges:    $Gait Training: 8-22 mins $Therapeutic Activity: 8-22 mins PT General Charges $$ ACUTE PT VISIT: 1 Visit                     Randall SAUNDERS, PT, DPT Acute Rehabilitation Services Office:  [phone] Secure Chat Preferred  Luis [name] 04/10/2024, 9:29 AM

## 2024-04-10 NOTE — H&P (View-Only) (Signed)
 NAME:  Luis [name], MRN:  [id number], DOB:  03-25-1941, LOS: 3 ADMISSION DATE:  04/07/2024, CONSULTATION DATE:  10/30 REFERRING MD:  KC-TRH, CHIEF COMPLAINT:  acute resp failure   History of Present Illness:  Luis [name] is an 83 y/o gentleman with a history of pulmonary sarcoidosis on chronic steriods, COPD-asthma overlap on triple inhaled therapy & Dupixent , stage IV cecal adenocarcinoma being treated at [facility] since 2019 who presented to the hospital with progressive shortness of breath and dry cough over 4 days on 04/07/2024.  He had been seen in pulmonary clinic today following his symptoms starting on 7/23.  He had an exacerbation of his respiratory disease in March and September of this year.  Since his exacerbation in September he had gone back on prednisone , tapering from 30 mg to 20 mg to 10 mg daily.  When he got back down to 10 mg daily dose he felt his symptoms flared up again, similar to tapers done in the 1990s when he was on chronic steroids for sarcoidosis.  He has only been on intermittent steroids since 1990s; he has not been on steroid sparing DMARD therapy due to quiescent sarcoidosis over the years.  He has not had sick contacts recently.  No new symptoms other than some leg swelling consistent with hospital.  No fevers.  He has had green sputum production in the hospital, but did not have sputum at home.  He is not on home oxygen.  He last had chemotherapy 2 weeks ago-pembrolizumab and 5-fluorouracil .  He felt well after this for a few days before starting to feel run down.  His meds are not to take primary support.  He is on Eliquis for DVT diagnosed in late July 2025 & Afib.  Patient hospital because required 4 to 6 L nasal cannula.  He has been maintained on azithromycin , ceftriaxone , prednisone  40mg  daily. Overall he felt a little better yesterday but is more dyspneic again today.   Pertinent  Medical History  Afib, recent cardioversion Metastatic adenocarcinoma of  cecum Pulmonary sarcoidosis diagnosed by open lung biopsy in 1990s COPD asthma overlap Coronary artery disease HTN HLD  Significant Hospital Events: Including procedures, antibiotic start and stop dates in addition to other pertinent events   10/27 admitted to the hospital, started on ceftriaxone  and azithromycin , prednisone  40 mg daily  Interim History / Subjective:    Objective    Blood pressure 138/74, pulse (!) 110, temperature (!) 97.5 F (36.4 C), temperature source Oral, resp. rate 18, height 5' 11 (1.803 m), weight 96.3 kg, SpO2 92%.    FiO2 (%):  [40 %] 40 %   Intake/Output Summary (Last 24 hours) at 04/10/2024 1331 Last data filed at 04/10/2024 0800 Gross per 24 hour  Intake 820 ml  Output 800 ml  Net 20 ml   Filed Weights   04/07/24 0048 04/09/24 1113 04/10/24 0500  Weight: 99.6 kg 95.8 kg 96.3 kg    Examination: General: Elderly man sitting up at the edge of bed in no acute distress HENT: [place]/AT, eyes anicteric Lungs: Wheezing on the left, faint rales on the left, clear on the right.  Mild conversational dyspnea. Cardiovascular: S1-S2, irregularly, regular rate Abdomen: Obese, soft, nontender Extremities: Mild symmetric bilateral lower extremity edema Neuro: Awake, alert, answering questions appropriately  Ct chest personally reviewed>  Aortic aneurysm, vascular calcifications. Patulous esophagus. Subpleural nodules throughout.  Diffuse patchy GGO throughout left lung, RLL confluent dense opacity with traction bronchiectasis. Many calcified mediastinal LN.  Compared to  June 2025, GGO in left lung are new. Nodules are similar, RLL dense opacity is similar. Post-surgical changes in RLL.  RVP negative COVID-negative  WBC 12.9, down from 14.5 at admission   Resolved problem list   Assessment and Plan   Acute respiratory failure with hypoxia with new left lung patchy ground glass opacities concerning for acute inflammation or infection.  Most likely cause  would be something infectious with unilateral presentation.  Although pembrolizumab associated lung toxicity can present with ground glass opacity that would be more common to see this bilaterally.  About 5 weeks ago, increasing his risk for opportunistic infection.  -Sputum culture - Recommend bronchoscopy with BAL given persistence of symptoms and oxygen requirement with immunocompromised host.  Since he is on apixaban that likely should not be stopped due to recent DVT, there will only be an option for bronchoscopy with bronchoalveolar lavage.  He and his wife understand low but potential bleeding risks with bronchoscopy and agree to bronchoscopy. NPO past midnight.  -con't current antibiotics -supplemental O2 as required to maintain SpO2 >90%    Luis SHAUNNA Gaskins, Luis [name] 04/10/24 3:45 PM [facility]  For contact information, see Amion. If no response to pager, please call PCCM consult pager. After hours, 7PM- 7AM, please call Elink.    Labs   CBC: Recent Labs  Lab 04/07/24 0036 04/07/24 0442 04/08/24 0233 04/09/24 0816 04/10/24 0419  WBC 14.5* 14.5* 14.5* 15.1* 12.9*  HGB 11.9* 11.1* 11.1* 12.1* 12.2*  HCT 38.1* 35.3* 34.6* 37.8* 38.4*  MCV 102.4* 99.2 99.1 98.7 98.0  PLT 144* 125* 108* 149* 131*    Basic Metabolic Panel: Recent Labs  Lab 04/07/24 0036 04/07/24 0442 04/08/24 0233 04/09/24 0510 04/10/24 0419  NA 135 133* 133* 134* 137  K 5.5* 4.2 4.1 4.1 3.5  CL 98 99 98 101 96*  CO2 19* 20* 23 18* 27  GLUCOSE 262* 291* 113* 148* 99  BUN 14 13 19 18 19   CREATININE 0.89 0.79 0.79 0.79 0.87  CALCIUM  8.5* 8.1* 8.4* 8.3* 8.3*  MG  --  1.5* 1.8  --   --   PHOS  --  2.7  --   --   --    GFR: Estimated Creatinine Clearance: 76.2 mL/min (by C-G formula based on SCr of 0.87 mg/dL). Recent Labs  Lab 04/07/24 0442 04/07/24 0453 04/07/24 0800 04/08/24 0233 04/09/24 0816 04/10/24 0419  PROCALCITON 1.50  --   --   --   --   --   WBC 14.5*  --   --   14.5* 15.1* 12.9*  LATICACIDVEN  --  3.3* 2.5*  --   --   --     Liver Function Tests: No results for input(s): AST, ALT, ALKPHOS, BILITOT, PROT, ALBUMIN in the last 168 hours. No results for input(s): LIPASE, AMYLASE in the last 168 hours. No results for input(s): AMMONIA in the last 168 hours.  ABG    Component Value Date/Time   PHART 7.385 04/25/2022 0756   PCO2ART 42.1 04/25/2022 0756   PO2ART 73 (L) 04/25/2022 0756   HCO3 24.6 04/07/2024 0520   TCO2 28 04/25/2022 0802   TCO2 28 04/25/2022 0802   O2SAT 86.2 04/07/2024 0520     Coagulation Profile: Recent Labs  Lab 04/07/24 0036  INR 1.3*    Cardiac Enzymes: No results for input(s): CKTOTAL, CKMB, CKMBINDEX, TROPONINI in the last 168 hours.  HbA1C: Hgb A1c MFr Bld  Date/Time Value Ref Range Status  04/07/2024 04:42 AM 7.1 (H) 4.8 - 5.6 % Final    Comment:    (NOTE) Diagnosis of Diabetes The following HbA1c ranges recommended by the American Diabetes Association (ADA) may be used as an aid in the diagnosis of diabetes mellitus.  Hemoglobin             Suggested A1C NGSP%              Diagnosis  <5.7                   Non Diabetic  5.7-6.4                Pre-Diabetic  >6.4                   Diabetic  <7.0                   Glycemic control for                       adults with diabetes.    07/23/2017 11:50 AM 5.2 4.8 - 5.6 % Final    Comment:    (NOTE) Pre diabetes:          5.7%-6.4% Diabetes:              >6.4% Glycemic control for   <7.0% adults with diabetes     CBG: Recent Labs  Lab 04/09/24 1146 04/09/24 1740 04/09/24 2328 04/10/24 0842 04/10/24 1226  GLUCAP 187* 270* 111* 106* 195*    Review of Systems:   Review of Systems  Constitutional:  Negative for chills and fever.  HENT:  Negative for congestion.   Respiratory:  Positive for cough, sputum production and shortness of breath.   Cardiovascular:  Positive for leg swelling. Negative for chest pain.   Gastrointestinal: Negative.   Musculoskeletal: Negative.      Past Medical History:  He,  has a past medical history of Adenocarcinoma of cecum (HCC) (06/21/2017), Arthritis, Asthmatic bronchitis with acute exacerbation (06/27/2021), Basal cell carcinoma, CAD (coronary artery disease), Chronic bronchitis (HCC), Diverticulosis (05/2005), Elevated lipase, Fuchs' corneal dystrophy, History of adenomatous polyp of colon (05/2005), History of blood transfusion, Hyperlipidemia, Hypertension, Iron deficiency anemia due to chronic blood loss (06/21/2017), Pneumonia, Prostate cancer (HCC) (2011), Sarcoidosis, Thrombocytopenia, and Trifascicular block.   Surgical History:   Past Surgical History:  Procedure Laterality Date   BASAL CELL CARCINOMA EXCISION Left    shoulder   CARDIAC CATHETERIZATION N/A 02/24/2015   Procedure: Coronary/Bypass Graft CTO Intervention;  Surgeon: Candyce GORMAN Reek, MD;  Location: [facility] INVASIVE CV LAB;  Service: Cardiovascular;  Laterality: N/A;   CARDIAC CATHETERIZATION  02/24/2015   Procedure: Coronary/Graft Atherectomy;  Surgeon: Candyce GORMAN Reek, MD;  Location: [facility] INVASIVE CV LAB;  Service: Cardiovascular;;   CARDIOVERSION N/A 02/07/2024   Procedure: CARDIOVERSION;  Surgeon: Raford Riggs, MD;  Location: [facility] INVASIVE CV LAB;  Service: Cardiovascular;  Laterality: N/A;   CATARACT EXTRACTION W/ INTRAOCULAR LENS  IMPLANT, BILATERAL Bilateral ~ 2011-2012   COLONOSCOPY W/ POLYPECTOMY  06/08/2005 and 10/18/2010   8 mm adenoma 2006, none 2012. Diverticulosis and internal hemorrhoids.   CORNEAL TRANSPLANT Bilateral ~ 2011-2012   @ same time as cataract OR   CORONARY ANGIOPLASTY WITH STENT PLACEMENT  08/2014; 02/24/2015   1 stent + 1 stent   CORONARY STENT INTERVENTION N/A 01/02/2019   Procedure: CORONARY STENT INTERVENTION;  Surgeon: Reek Candyce GORMAN, MD;  Location: [facility] INVASIVE CV LAB;  Service: Cardiovascular;  Laterality: N/A;   EYE SURGERY     FINGER SURGERY Right  2014   reattached middle finger   INSERTION PROSTATE RADIATION SEED  ~ 2011   IR IMAGING GUIDED PORT INSERTION  03/04/2019   IR RADIOLOGIST EVAL & MGMT  05/26/2020   IR US  GUIDE BX ASP/DRAIN  03/04/2019   LAPAROSCOPIC CHOLECYSTECTOMY  2015   LAPAROSCOPIC PARTIAL COLECTOMY N/A 07/26/2017   Procedure: LAPAROSCOPIC PARTIAL COLECTOMY, EXCISION SCROTAL CYST;  Surgeon: Tanda Locus, MD;  Location: [facility] ORS;  Service: General;  Laterality: N/A;   LEFT HEART CATH AND CORONARY ANGIOGRAPHY N/A 01/02/2019   Procedure: LEFT HEART CATH AND CORONARY ANGIOGRAPHY;  Surgeon: Dann Candyce RAMAN, MD;  Location: [facility] INVASIVE CV LAB;  Service: Cardiovascular;  Laterality: N/A;   LEFT HEART CATHETERIZATION WITH CORONARY ANGIOGRAM N/A 08/12/2014   Procedure: LEFT HEART CATHETERIZATION WITH CORONARY ANGIOGRAM;  Surgeon: Ozell JONETTA Fell, MD;  Location: [facility] CATH LAB;  Service: Cardiovascular;  Laterality: N/A;   LYMPH NODE BIOPSY     mid chest   MOHS SURGERY Right ~ 2011   eyelid; for basal cell   RADIOLOGY WITH ANESTHESIA N/A 06/30/2020   Procedure: IR WITH ANESTHESIA MICROWAVE ABLATION;  Surgeon: Alona Corners, Luis [name];  Location: [facility] ORS;  Service: Anesthesiology;  Laterality: N/A;   RIGHT/LEFT HEART CATH AND CORONARY ANGIOGRAPHY N/A 04/25/2022   Procedure: RIGHT/LEFT HEART CATH AND CORONARY ANGIOGRAPHY;  Surgeon: Dann Candyce RAMAN, MD;  Location: [facility] INVASIVE CV LAB;  Service: Cardiovascular;  Laterality: N/A;   THORACENTESIS N/A 02/11/2021   Procedure: MILANA;  Surgeon: Annella Donnice SAUNDERS, MD;  Location: [facility] ENDOSCOPY;  Service: Pulmonary;  Laterality: N/A;   THORACENTESIS Right 04/04/2021   Procedure: THORACENTESIS;  Surgeon: Claudene Toribio BROCKS, MD;  Location: [facility] ENDOSCOPY;  Service: Pulmonary;  Laterality: Right;   THORACENTESIS Right 08/16/2021   Procedure: THORACENTESIS;  Surgeon: Harold Scholz, MD;  Location: [facility] ENDOSCOPY;  Service: Pulmonary;  Laterality: Right;   THORACENTESIS Right 11/29/2021   Procedure:  THORACENTESIS;  Surgeon: Kara Dorn NOVAK, MD;  Location: [facility] ENDOSCOPY;  Service: Pulmonary;  Laterality: Right;   THORACENTESIS N/A 03/17/2022   Procedure: MILANA;  Surgeon: Annella Donnice SAUNDERS, MD;  Location: [facility] ENDOSCOPY;  Service: Pulmonary;  Laterality: N/A;   THORACENTESIS N/A 07/14/2022   Procedure: MILANA;  Surgeon: Brenna Adine CROME, Luis [name];  Location: [facility] ENDOSCOPY;  Service: Pulmonary;  Laterality: N/A;   THORACENTESIS N/A 10/11/2022   Procedure: MILANA;  Surgeon: Harold Scholz, MD;  Location: [facility] ENDOSCOPY;  Service: Pulmonary;  Laterality: N/A;   THOROCOTOMY WITH LOBECTOMY Right ~ 1991   partial removal right lung   TONSILLECTOMY  ~ 1950     Social History:   reports that he has quit smoking. His smoking use included cigars. He has never used smokeless tobacco. He reports current alcohol use of about 2.0 standard drinks of alcohol per week. He reports that he does not use drugs.   Family History:  His family history includes Coronary artery disease (age of onset: 26) in his sister; Heart attack in his sister; Heart disease (age of onset: 84) in his father. There is no history of Colon cancer, Throat cancer, Pancreatic cancer, Prostate cancer, Stroke, or Hypertension.   Allergies Allergies  Allergen Reactions   Losartan  Potassium Other (See Comments)    Hyperkalemia     Home Medications  Prior to Admission medications   Medication Sig Start Date End Date Taking? Authorizing Provider  albuterol  (PROVENTIL ) (2.5 MG/3ML) 0.083% nebulizer solution INHALE 3  ML BY NEBULIZATION EVERY 6 HOURS AS NEEDED FOR WHEEZING OR SHORTNESS OF BREATH 11/12/23  Yes Cobb, Comer GAILS, NP  albuterol  (VENTOLIN  HFA) 108 (90 Base) MCG/ACT inhaler INHALE 1-2 PUFFS BY MOUTH EVERY 6 HOURS AS NEEDED FOR WHEEZE OR SHORTNESS OF BREATH 07/03/22  Yes Hunsucker, Donnice SAUNDERS, MD  amoxicillin -clavulanate (AUGMENTIN ) 875-125 MG tablet Take 1 tablet by mouth 2 (two) times daily. 04/03/24  Yes Hope Almarie ORN, NP  apixaban (ELIQUIS) 5 MG TABS tablet Take 5 mg by mouth 2 (two) times daily. 01/17/24  Yes [provider]  budesonide -glycopyrrolate-formoterol (BREZTRI  AEROSPHERE) 160-9-4.8 MCG/ACT AERO inhaler Inhale 2 puffs into the lungs in the morning and at bedtime. 04/03/24  Yes Hope Almarie ORN, NP  cetirizine  (ZYRTEC ) 10 MG tablet Take 1 tablet (10 mg total) by mouth daily. Patient taking differently: Take 10 mg by mouth daily as needed for allergies. 09/13/23  Yes Meade Verdon RAMAN, MD  Cholecalciferol  (VITAMIN D3) 2000 units capsule Take 2,000 Units by mouth daily.   Yes [provider]  dronedarone  (MULTAQ ) 400 MG tablet Take 1 tablet (400 mg total) by mouth 2 (two) times daily with a meal. 02/12/24  Yes Ladona Heinz, MD  Dupilumab  (DUPIXENT ) 300 MG/2ML SOAJ Inject 300 mg into the skin every 14 (fourteen) days. 03/31/24  Yes Hunsucker, Donnice SAUNDERS, MD  ezetimibe  (ZETIA ) 10 MG tablet TAKE 1 TABLET BY MOUTH EVERY DAY 01/01/24  Yes Swinyer, Rosaline HERO, NP  fluticasone  (FLONASE ) 50 MCG/ACT nasal spray Place 2 sprays into both nostrils daily. 09/25/23  Yes Hunsucker, Donnice SAUNDERS, MD  furosemide  (LASIX ) 20 MG tablet Take 1 tablet (20 mg total) by mouth daily as needed for fluid or edema. Patient taking differently: Take 20 mg by mouth daily. 01/08/24  Yes Ladona Heinz, MD  gabapentin  (NEURONTIN ) 100 MG capsule Take 200 mg by mouth at bedtime.   Yes [provider]  guaiFENesin -dextromethorphan  (ROBITUSSIN DM) 100-10 MG/5ML syrup Take 10 mLs by mouth every 6 (six) hours as needed for cough.   Yes [provider]  ibuprofen (ADVIL) 200 MG tablet Take 400 mg by mouth every 6 (six) hours as needed for moderate pain (pain score 4-6) or mild pain (pain score 1-3).   Yes [provider]  levothyroxine  (SYNTHROID ) 50 MCG tablet Take 50 mcg by mouth daily before breakfast. 02/20/23  Yes [provider]  magnesium  oxide (MAG-OX) 400 MG tablet Take 1 tablet by mouth 2  (two) times daily.   Yes [provider]  metroNIDAZOLE (METROCREAM) 0.75 % cream Apply 1 Application topically 2 (two) times daily as needed (rash). 09/21/23  Yes [provider]  minocycline (MINOCIN) 100 MG capsule Take 100 mg by mouth daily. 12/09/23  Yes [provider]  montelukast  (SINGULAIR ) 10 MG tablet Take 1 tablet (10 mg total) by mouth at bedtime. 09/13/23  Yes Desai, Nikita S, MD  Multiple Vitamins-Minerals (VITRUM SENIOR) TABS Take 1 tablet by mouth daily.   Yes [provider]  Naphazoline-Pheniramine ([employer] EYE ALLERGY RELIEF) 0.027-0.315 % SOLN Place 1 drop into both eyes 2 (two) times daily.   Yes [provider]  naproxen  sodium (ALEVE ) 220 MG tablet Take 440 mg by mouth 2 (two) times daily as needed (pain).   Yes [provider]  potassium chloride  (KLOR-CON ) 10 MEQ tablet Take 10 mEq by mouth daily. 02/17/24 02/16/25 Yes [provider]  predniSONE  (DELTASONE ) 5 MG tablet Take 3 tablets (15mg ) daily 04/03/24  Yes Hope Almarie ORN, NP  rosuvastatin  (  CRESTOR ) 20 MG tablet TAKE 1 TABLET BY MOUTH EVERY DAY 02/07/24  Yes Ladona Heinz, MD  traMADol  (ULTRAM ) 50 MG tablet Take 2 tablets (100 mg total) by mouth every 6 (six) hours as needed. 05/29/21  Yes Ebbie Cough, MD  nitroGLYCERIN  (NITROSTAT ) 0.4 MG SL tablet PLACE 1 TABLET UNDER THE TONGUE EVERY 5 MINUTES AS NEEDED FOR CHEST PAIN FOR 3 DOSES Patient not taking: Reported on 04/07/2024 08/15/23   Lelon Hamilton T, PA-C  ranolazine  (RANEXA ) 500 MG 12 hr tablet TAKE 1 TABLET BY MOUTH TWICE A DAY Patient not taking: Reported on 04/07/2024 10/04/23   Lelon Hamilton DASEN, PA-C     Critical care time:       Luis SHAUNNA Gaskins, Luis [name] 04/10/24 1:31 PM [facility]  For contact information, see Amion. If no response to pager, please call PCCM consult pager. After hours, 7PM- 7AM, please call Elink.

## 2024-04-10 NOTE — Anesthesia Preprocedure Evaluation (Addendum)
 Anesthesia Evaluation  Patient identified by MRN, date of birth, ID band Patient awake    Reviewed: Allergy & Precautions, NPO status , Patient's Chart, lab work & pertinent test results  History of Anesthesia Complications Negative for: history of anesthetic complications  Airway Mallampati: II  TM Distance: >3 FB Neck ROM: Full    Dental no notable dental hx. (+) Upper Dentures, Dental Advisory Given   Pulmonary asthma , COPD, former smoker Sarcoidosis   Pulmonary exam normal breath sounds clear to auscultation       Cardiovascular hypertension, + CAD and +CHF (HFpEF)  Normal cardiovascular exam+ dysrhythmias Atrial Fibrillation  Rhythm:Regular Rate:Normal   04/09/2024 TTe 1. Left ventricular ejection fraction, by estimation, is 70 to 75%. The  left ventricle has hyperdynamic function. The left ventricle has no  regional wall motion abnormalities. Left ventricular diastolic parameters  are consistent with Grade I diastolic  dysfunction (impaired relaxation).   2. Right ventricular systolic function is normal. The right ventricular  size is normal.   3. The mitral valve is degenerative. No evidence of mitral valve  regurgitation.   4. Difficult assessment for aortic stenosis, poor LVOT Doppler  evaluation. Consider future dedicated aortic stenosis study as outpatient.  The aortic valve was not well visualized. Aortic valve regurgitation is  not visualized. Aortic valve sclerosis is  present, with no evidence of aortic valve stenosis.   5. The inferior vena cava is normal in size with greater than 50%  respiratory variability, suggesting right atrial pressure of 3 mmHg.     Neuro/Psych  negative psych ROS   GI/Hepatic Neg liver ROS,,,  Endo/Other  Hypothyroidism    Renal/GU Lab Results      Component                Value               Date                                   K                        3.5                  04/10/2024                CO2                      27                  04/10/2024                BUN                      19                  04/10/2024                CREATININE               0.87                04/10/2024                GFRNONAA                 >60  04/10/2024                CALCIUM                   8.3 (L)             04/10/2024                     GLUCOSE                  99                  04/10/2024                Musculoskeletal  (+) Arthritis ,    Abdominal   Peds  Hematology Lab Results      Component                Value               Date                      WBC                      12.9 (H)            04/10/2024                HGB                      12.2 (L)            04/10/2024                HCT                      38.4 (L)            04/10/2024                MCV                      98.0                04/10/2024                PLT                      131 (L)             04/10/2024              Anesthesia Other Findings All: losartan   Reproductive/Obstetrics                              Anesthesia Physical Anesthesia Plan  ASA: 3  Anesthesia Plan: General   Post-op Pain Management: Minimal or no pain anticipated and Precedex   Induction: Intravenous  PONV Risk Score and Plan: 3 and Treatment may vary due to age or medical condition, Ondansetron , Propofol  infusion, TIVA and Dexamethasone   Airway Management Planned: Oral ETT  Additional Equipment: None  Intra-op Plan:   Post-operative Plan: Extubation in OR  Informed Consent: I have reviewed the patients History and Physical, chart, labs and discussed the procedure including the risks, benefits and alternatives for the proposed anesthesia with the patient or authorized representative who has indicated his/her understanding and acceptance.  Dental advisory given  Plan Discussed with: CRNA and Surgeon  Anesthesia Plan Comments:  (Video Bronchospy for: immunocompromised, sarcoidosis, acute respiratory failure with hypoxia, left lung pneumonia)         Anesthesia Quick Evaluation

## 2024-04-10 NOTE — Progress Notes (Signed)
 PROGRESS NOTE Luis [name]  FMW:[id number] DOB: July 12, 1940 DOA: 04/07/2024 PCP: [name], MD  Brief Narrative/Hospital Course: Luis [name] is a 83 y.o. male with PMH of  metastatic colon cancer, mets to lungs, liver, LN's, sarcoidosis, COPD, CAD hx PCI, HFpEF, hypertension, hyperlipidemia, hypothyroidism, who presented with progressive shortness of breath onset x 1 wk  with  exertional dyspnea, chest congestion, minimally productive cough with clear and scant blood-tinged sputum, seen by pulmonology and was started on Augmentin  on Tuesday but by Thursday his symptoms started worsening and progressively worsened over the weekend.  He ended up checking his pulse oximeter at home which was reading in the high 60s so he presented to the ED  In the ED tachypneic, hypoxic,labs-mag low 1.5 hyperglycemic 291 lactic acidosis 3.3 Pro-Cal 1.5 leukocytosis 14.5, A1c 7.1, high sensitive troponin 29 proBNP 249 EKG sinus rhythm LAD LVH prolonged Qtc, situational up to 75% room air given 4 L nasal cannula Chest x-ray>> right lung base increased patchy airspace opacities and new left infrahilar airspace opacities concerning for multifocal pneumonia small right pleural effusion CT chest>> Postsurgical and radiation changes in the right lower lobe. Multifocal ground glass opacity with mosaic attenuation in the left lung, favoring mild multifocal infection/pneumonia, although pneumonitis related to immunotherapy is also possible.  Stable underlying sequela of sarcoidosis. Patient admitted for acute hypoxic respiratory failure with concern for multifocal pneumonia in the setting of metastatic colon cancer  Home Eliquis continued, Lasix  trial given along with increased dose of prednisone  without significant improvement  10/3o Pulm consulted  Subjective: Seen and examined Report she is feeling overall better with diuresis, now on 3 L nasal cannula, but legs are still edematous He reported being too short  of breath on ambulation Overnight afebrile BP stable Labs reviewed stable BMP CBC with improving WBC count hemoglobin stable Urine output 550 cc charted weight at 212 pounds on admission to 219  Assessment and plan:  Acute hypoxic respiratory failure Possible multifocal pneumonia versus pneumonitis  Vs metastatic colon cancer related History of sarcoidosis: CT chest:Multifocal ground glass opacity with mosaic attenuation in the left lung, favoring mild multifocal infection/pneumonia, although pneumonitis related to immunotherapy is also possible Blood culture from admission NGTD. Reports being on steroid for last 2 weeks 20 mg of prednisone  here needing 4 L Crisp Managing with ceftriaxone  azithromycin ,bronchodilators I-S OOB.  On Dupixent , Breztri , montelukast  PTA Of note patient is already on anticoagulant Eliquis and less likelihood of PE. Echo obtained for completeness and has normal EF.  70 to 75% G1 DD RV SF is normal RV size is normal Does have edematous legs ans changed po to  IV Lasix  10/29, bnp was up 237 Overall improving slowly but he now feels worse in therms of breathing on ambulation- he is known to Dr Annella- [facility] to evaluate  Acute on chronic HFpEF Chronic leg edema: Leg is still edematous given his hypoxia will change to IV Lasix  monitor daily weight.He reports his weight a week ago was 216 pound He has been intentionally losing weight before that he was 240.  Weight at 219 pound on admission now 212. Cont to monitor daily I/O,weight, electrolytes and net balance as below.Keep on  salt/fluid restricted diet and monitor in tele. Net IO Since Admission: -60 mL [04/10/24 1256]  Filed Weights   04/07/24 0048 04/09/24 1113 04/10/24 0500  Weight: 99.6 kg 95.8 kg 96.3 kg    Recent Labs  Lab 04/07/24 0036 04/07/24 0442 04/08/24 0233 04/09/24 0510  04/09/24 0816 04/10/24 0419  BNP 249.3*  --   --   --  237.9*  --   BUN 14 13 19 18   --  19  CREATININE 0.89 0.79  0.79 0.79  --  0.87  K 5.5* 4.2 4.1 4.1  --  3.5  MG  --  1.5* 1.8  --   --   --    Elevated troponin: suspect demand ischemia with flat troponin. TTE is unremarkable  Hypomagnesemia: Resolved.    Lactic acidosis: Likely in the setting of respiratory failure-volume depletion hyperglycemia  New onset type 2 diabetes with uncontrolled hyperglycemia: A1c at 7.1.  Blood sugar up to 400-diet changed to carb controlled diet,started on glipizide and metformin ssi Blood sugar fairly controlled.  Suspect in the setting of his steroid use as well Recent Labs  Lab 04/07/24 0442 04/07/24 0800 04/09/24 1146 04/09/24 1740 04/09/24 2328 04/10/24 0842 04/10/24 1226  GLUCAP  --    < > 187* 270* 111* 106* 195*  HGBA1C 7.1*  --   --   --   --   --   --    < > = values in this interval not displayed.    Metastatic colon cancer, R hemicolectomy, mets to lungs, liver, LN's: Followed by [facility], has known RUL nodule and opacities in the R base ? Lymphangitic spread v. Inflammation/infection (last in 6/'25). He is currently on maintenance 5FU and Panitumumab  every 2 weeks.  Sarcoidosis: Follows with pulmonology, appears that he is currently on chronic steroids with prednisone  at a dose of 15 mg daily   PHTN: Suggested on imaging, noted   HTN HLD CAD hx PCI: Bp stable, no chest pain. Cont his DOAC, rosuvastatin , zetia ,  Ranolazine    Aflutter: Rate controlled. Cont home Apixaban, Dronedarone    Aortic ectasia: OP surveillance   Hypothyroidism: Continue home levothyroxine    Chronic anemia:  Stable at baseline.B12 and folate normal.  Thrombocytopenia: Monitor platelet count, question if from chemo  ?Chronic pain: Continue home Gabapentin    ?rosacea: continue home Minocycline.   Class  I Obesity w/ Body mass index is 29.61 kg/m.: will benefit with PCP follow-up, weight loss,healthy lifestyle and outpatient sleep eval if not done.  Mobility: PT OT requested PT Orders:  Active  PT Follow up Rec: Outpatient Pt (Pulmonary Rehab)04/10/2024 0800    DVT prophylaxis:  Code Status:   Code Status: Prior Family Communication: plan of care discussed with patient at bedside. Patient status is: Remains hospitalized because of severity of illness Level of care: Telemetry Medical   Dispo: The patient is from: home            Anticipated disposition: TBD Objective: Vitals last 24 hrs: Vitals:   04/10/24 0622 04/10/24 0835 04/10/24 0940 04/10/24 1013  BP: (!) 150/77  138/74   Pulse: 63 98 (!) 110   Resp: 18 18 18    Temp: 98.7 F (37.1 C)  (!) 97.5 F (36.4 C)   TempSrc: Oral  Oral   SpO2: 96%  (!) 87% 92%  Weight:      Height:        Physical Examination: General exam: alert awake oriented, pleasant HEENT:Oral mucosa moist, Ear/Nose WNL grossly Respiratory system: Bilaterally clear BS,no use of accessory muscle Cardiovascular system: S1 & S2 +, No JVD. Gastrointestinal system: Abdomen soft,NT,ND, BS+ Nervous System: Alert, awake, moving all extremities,and following commands. Extremities: extremities warm, leg edema  2+ Skin: Warm, no rashes MSK: Normal muscle bulk,tone, power   Medications reviewed:  Scheduled Meds:  apixaban  5 mg Oral BID   budesonide -glycopyrrolate-formoterol  2 puff Inhalation BID   dronedarone   400 mg Oral BID WC   ezetimibe   10 mg Oral Daily   furosemide   40 mg Intravenous BID   gabapentin   200 mg Oral QHS   glipiZIDE  2.5 mg Oral QAC breakfast   guaiFENesin   600 mg Oral BID   insulin aspart  0-15 Units Subcutaneous TID WC   levothyroxine   50 mcg Oral Q0600   metFORMIN  500 mg Oral BID WC   minocycline  100 mg Oral Daily   montelukast   10 mg Oral QHS   [START ON 04/11/2024] predniSONE   40 mg Oral Q breakfast   ranolazine   500 mg Oral BID   rosuvastatin   20 mg Oral Daily   Continuous Infusions:  cefTRIAXone  (ROCEPHIN )  IV 1 g (04/09/24 2332)   Diet: Diet Order             Diet Carb Modified Fluid consistency:  Thin; Room service appropriate? Yes  Diet effective now                    Data Reviewed: I have personally reviewed following labs and imaging studies ( see epic result tab) CBC: Recent Labs  Lab 04/07/24 0036 04/07/24 0442 04/08/24 0233 04/09/24 0816 04/10/24 0419  WBC 14.5* 14.5* 14.5* 15.1* 12.9*  HGB 11.9* 11.1* 11.1* 12.1* 12.2*  HCT 38.1* 35.3* 34.6* 37.8* 38.4*  MCV 102.4* 99.2 99.1 98.7 98.0  PLT 144* 125* 108* 149* 131*   CMP: Recent Labs  Lab 04/07/24 0036 04/07/24 0442 04/08/24 0233 04/09/24 0510 04/10/24 0419  NA 135 133* 133* 134* 137  K 5.5* 4.2 4.1 4.1 3.5  CL 98 99 98 101 96*  CO2 19* 20* 23 18* 27  GLUCOSE 262* 291* 113* 148* 99  BUN 14 13 19 18 19   CREATININE 0.89 0.79 0.79 0.79 0.87  CALCIUM  8.5* 8.1* 8.4* 8.3* 8.3*  MG  --  1.5* 1.8  --   --   PHOS  --  2.7  --   --   --    GFR: Estimated Creatinine Clearance: 76.2 mL/min (by C-G formula based on SCr of 0.87 mg/dL). No results for input(s): AST, ALT, ALKPHOS, BILITOT, PROT, ALBUMIN in the last 168 hours. No results for input(s): LIPASE, AMYLASE in the last 168 hours. No results for input(s): AMMONIA in the last 168 hours. Coagulation Profile:  Recent Labs  Lab 04/07/24 0036  INR 1.3*   Unresulted Labs (From admission, onward)     Start     Ordered   04/08/24 0500  Basic metabolic panel with GFR  Daily,   R      04/07/24 0737   04/08/24 0500  CBC  Daily,   R      04/07/24 0737   04/07/24 0238  Expectorated Sputum Assessment w Gram Stain, Rflx to Resp Cult  Once,   R        04/07/24 0238           Antimicrobials/Microbiology: Anti-infectives (From admission, onward)    Start     Dose/Rate Route Frequency Ordered Stop   04/08/24 1000  azithromycin  (ZITHROMAX ) tablet 500 mg        500 mg Oral Daily 04/07/24 0238 04/09/24 0852   04/08/24 0000  cefTRIAXone  (ROCEPHIN ) 1 g in sodium chloride  0.9 % 100 mL IVPB        1 g 200  mL/hr over 30 Minutes Intravenous Every  24 hours 04/07/24 0238 04/11/24 2359   04/07/24 1000  minocycline (MINOCIN) capsule 100 mg        100 mg Oral Daily 04/07/24 0241     04/07/24 0130  cefTRIAXone  (ROCEPHIN ) 2 g in sodium chloride  0.9 % 100 mL IVPB        2 g 200 mL/hr over 30 Minutes Intravenous  Once 04/07/24 0124 04/07/24 0255   04/07/24 0130  azithromycin  (ZITHROMAX ) 500 mg in sodium chloride  0.9 % 250 mL IVPB        500 mg 250 mL/hr over 60 Minutes Intravenous  Once 04/07/24 0124 04/07/24 0337         Component Value Date/Time   SDES BLOOD SITE NOT SPECIFIED 04/07/2024 0153   SPECREQUEST  04/07/2024 0153    BOTTLES DRAWN AEROBIC AND ANAEROBIC Blood Culture adequate volume   CULT  04/07/2024 0153    NO GROWTH 3 DAYS Performed at [facility], [address], [place], [name] [address]    REPTSTATUS PENDING 04/07/2024 9846    Procedures:  Mennie LAMY, MD [facility] 04/10/2024, 12:56 PM

## 2024-04-10 NOTE — Consult Note (Signed)
 NAME:  Luis [name], MRN:  [id number], DOB:  03-25-1941, LOS: 3 ADMISSION DATE:  04/07/2024, CONSULTATION DATE:  10/30 REFERRING MD:  KC-TRH, CHIEF COMPLAINT:  acute resp failure   History of Present Illness:  Luis [name] is an 83 y/o gentleman with a history of pulmonary sarcoidosis on chronic steriods, COPD-asthma overlap on triple inhaled therapy & Dupixent , stage IV cecal adenocarcinoma being treated at [facility] since 2019 who presented to the hospital with progressive shortness of breath and dry cough over 4 days on 04/07/2024.  He had been seen in pulmonary clinic today following his symptoms starting on 7/23.  He had an exacerbation of his respiratory disease in March and September of this year.  Since his exacerbation in September he had gone back on prednisone , tapering from 30 mg to 20 mg to 10 mg daily.  When he got back down to 10 mg daily dose he felt his symptoms flared up again, similar to tapers done in the 1990s when he was on chronic steroids for sarcoidosis.  He has only been on intermittent steroids since 1990s; he has not been on steroid sparing DMARD therapy due to quiescent sarcoidosis over the years.  He has not had sick contacts recently.  No new symptoms other than some leg swelling consistent with hospital.  No fevers.  He has had green sputum production in the hospital, but did not have sputum at home.  He is not on home oxygen.  He last had chemotherapy 2 weeks ago-pembrolizumab and 5-fluorouracil .  He felt well after this for a few days before starting to feel run down.  His meds are not to take primary support.  He is on Eliquis for DVT diagnosed in late July 2025 & Afib.  Patient hospital because required 4 to 6 L nasal cannula.  He has been maintained on azithromycin , ceftriaxone , prednisone  40mg  daily. Overall he felt a little better yesterday but is more dyspneic again today.   Pertinent  Medical History  Afib, recent cardioversion Metastatic adenocarcinoma of  cecum Pulmonary sarcoidosis diagnosed by open lung biopsy in 1990s COPD asthma overlap Coronary artery disease HTN HLD  Significant Hospital Events: Including procedures, antibiotic start and stop dates in addition to other pertinent events   10/27 admitted to the hospital, started on ceftriaxone  and azithromycin , prednisone  40 mg daily  Interim History / Subjective:    Objective    Blood pressure 138/74, pulse (!) 110, temperature (!) 97.5 F (36.4 C), temperature source Oral, resp. rate 18, height 5' 11 (1.803 m), weight 96.3 kg, SpO2 92%.    FiO2 (%):  [40 %] 40 %   Intake/Output Summary (Last 24 hours) at 04/10/2024 1331 Last data filed at 04/10/2024 0800 Gross per 24 hour  Intake 820 ml  Output 800 ml  Net 20 ml   Filed Weights   04/07/24 0048 04/09/24 1113 04/10/24 0500  Weight: 99.6 kg 95.8 kg 96.3 kg    Examination: General: Elderly man sitting up at the edge of bed in no acute distress HENT: [place]/AT, eyes anicteric Lungs: Wheezing on the left, faint rales on the left, clear on the right.  Mild conversational dyspnea. Cardiovascular: S1-S2, irregularly, regular rate Abdomen: Obese, soft, nontender Extremities: Mild symmetric bilateral lower extremity edema Neuro: Awake, alert, answering questions appropriately  Ct chest personally reviewed>  Aortic aneurysm, vascular calcifications. Patulous esophagus. Subpleural nodules throughout.  Diffuse patchy GGO throughout left lung, RLL confluent dense opacity with traction bronchiectasis. Many calcified mediastinal LN.  Compared to  June 2025, GGO in left lung are new. Nodules are similar, RLL dense opacity is similar. Post-surgical changes in RLL.  RVP negative COVID-negative  WBC 12.9, down from 14.5 at admission   Resolved problem list   Assessment and Plan   Acute respiratory failure with hypoxia with new left lung patchy ground glass opacities concerning for acute inflammation or infection.  Most likely cause  would be something infectious with unilateral presentation.  Although pembrolizumab associated lung toxicity can present with ground glass opacity that would be more common to see this bilaterally.  About 5 weeks ago, increasing his risk for opportunistic infection.  -Sputum culture - Recommend bronchoscopy with BAL given persistence of symptoms and oxygen requirement with immunocompromised host.  Since he is on apixaban that likely should not be stopped due to recent DVT, there will only be an option for bronchoscopy with bronchoalveolar lavage.  He and his wife understand low but potential bleeding risks with bronchoscopy and agree to bronchoscopy. NPO past midnight.  -con't current antibiotics -supplemental O2 as required to maintain SpO2 >90%    Leita SHAUNNA Gaskins, DO 04/10/24 3:45 PM [facility]  For contact information, see Amion. If no response to pager, please call PCCM consult pager. After hours, 7PM- 7AM, please call Elink.    Labs   CBC: Recent Labs  Lab 04/07/24 0036 04/07/24 0442 04/08/24 0233 04/09/24 0816 04/10/24 0419  WBC 14.5* 14.5* 14.5* 15.1* 12.9*  HGB 11.9* 11.1* 11.1* 12.1* 12.2*  HCT 38.1* 35.3* 34.6* 37.8* 38.4*  MCV 102.4* 99.2 99.1 98.7 98.0  PLT 144* 125* 108* 149* 131*    Basic Metabolic Panel: Recent Labs  Lab 04/07/24 0036 04/07/24 0442 04/08/24 0233 04/09/24 0510 04/10/24 0419  NA 135 133* 133* 134* 137  K 5.5* 4.2 4.1 4.1 3.5  CL 98 99 98 101 96*  CO2 19* 20* 23 18* 27  GLUCOSE 262* 291* 113* 148* 99  BUN 14 13 19 18 19   CREATININE 0.89 0.79 0.79 0.79 0.87  CALCIUM  8.5* 8.1* 8.4* 8.3* 8.3*  MG  --  1.5* 1.8  --   --   PHOS  --  2.7  --   --   --    GFR: Estimated Creatinine Clearance: 76.2 mL/min (by C-G formula based on SCr of 0.87 mg/dL). Recent Labs  Lab 04/07/24 0442 04/07/24 0453 04/07/24 0800 04/08/24 0233 04/09/24 0816 04/10/24 0419  PROCALCITON 1.50  --   --   --   --   --   WBC 14.5*  --   --   14.5* 15.1* 12.9*  LATICACIDVEN  --  3.3* 2.5*  --   --   --     Liver Function Tests: No results for input(s): AST, ALT, ALKPHOS, BILITOT, PROT, ALBUMIN in the last 168 hours. No results for input(s): LIPASE, AMYLASE in the last 168 hours. No results for input(s): AMMONIA in the last 168 hours.  ABG    Component Value Date/Time   PHART 7.385 04/25/2022 0756   PCO2ART 42.1 04/25/2022 0756   PO2ART 73 (L) 04/25/2022 0756   HCO3 24.6 04/07/2024 0520   TCO2 28 04/25/2022 0802   TCO2 28 04/25/2022 0802   O2SAT 86.2 04/07/2024 0520     Coagulation Profile: Recent Labs  Lab 04/07/24 0036  INR 1.3*    Cardiac Enzymes: No results for input(s): CKTOTAL, CKMB, CKMBINDEX, TROPONINI in the last 168 hours.  HbA1C: Hgb A1c MFr Bld  Date/Time Value Ref Range Status  04/07/2024 04:42 AM 7.1 (H) 4.8 - 5.6 % Final    Comment:    (NOTE) Diagnosis of Diabetes The following HbA1c ranges recommended by the American Diabetes Association (ADA) may be used as an aid in the diagnosis of diabetes mellitus.  Hemoglobin             Suggested A1C NGSP%              Diagnosis  <5.7                   Non Diabetic  5.7-6.4                Pre-Diabetic  >6.4                   Diabetic  <7.0                   Glycemic control for                       adults with diabetes.    07/23/2017 11:50 AM 5.2 4.8 - 5.6 % Final    Comment:    (NOTE) Pre diabetes:          5.7%-6.4% Diabetes:              >6.4% Glycemic control for   <7.0% adults with diabetes     CBG: Recent Labs  Lab 04/09/24 1146 04/09/24 1740 04/09/24 2328 04/10/24 0842 04/10/24 1226  GLUCAP 187* 270* 111* 106* 195*    Review of Systems:   Review of Systems  Constitutional:  Negative for chills and fever.  HENT:  Negative for congestion.   Respiratory:  Positive for cough, sputum production and shortness of breath.   Cardiovascular:  Positive for leg swelling. Negative for chest pain.   Gastrointestinal: Negative.   Musculoskeletal: Negative.      Past Medical History:  He,  has a past medical history of Adenocarcinoma of cecum (HCC) (06/21/2017), Arthritis, Asthmatic bronchitis with acute exacerbation (06/27/2021), Basal cell carcinoma, CAD (coronary artery disease), Chronic bronchitis (HCC), Diverticulosis (05/2005), Elevated lipase, Fuchs' corneal dystrophy, History of adenomatous polyp of colon (05/2005), History of blood transfusion, Hyperlipidemia, Hypertension, Iron deficiency anemia due to chronic blood loss (06/21/2017), Pneumonia, Prostate cancer (HCC) (2011), Sarcoidosis, Thrombocytopenia, and Trifascicular block.   Surgical History:   Past Surgical History:  Procedure Laterality Date   BASAL CELL CARCINOMA EXCISION Left    shoulder   CARDIAC CATHETERIZATION N/A 02/24/2015   Procedure: Coronary/Bypass Graft CTO Intervention;  Surgeon: Candyce GORMAN Reek, MD;  Location: [facility] INVASIVE CV LAB;  Service: Cardiovascular;  Laterality: N/A;   CARDIAC CATHETERIZATION  02/24/2015   Procedure: Coronary/Graft Atherectomy;  Surgeon: Candyce GORMAN Reek, MD;  Location: [facility] INVASIVE CV LAB;  Service: Cardiovascular;;   CARDIOVERSION N/A 02/07/2024   Procedure: CARDIOVERSION;  Surgeon: Raford Riggs, MD;  Location: [facility] INVASIVE CV LAB;  Service: Cardiovascular;  Laterality: N/A;   CATARACT EXTRACTION W/ INTRAOCULAR LENS  IMPLANT, BILATERAL Bilateral ~ 2011-2012   COLONOSCOPY W/ POLYPECTOMY  06/08/2005 and 10/18/2010   8 mm adenoma 2006, none 2012. Diverticulosis and internal hemorrhoids.   CORNEAL TRANSPLANT Bilateral ~ 2011-2012   @ same time as cataract OR   CORONARY ANGIOPLASTY WITH STENT PLACEMENT  08/2014; 02/24/2015   1 stent + 1 stent   CORONARY STENT INTERVENTION N/A 01/02/2019   Procedure: CORONARY STENT INTERVENTION;  Surgeon: Reek Candyce GORMAN, MD;  Location: [facility] INVASIVE CV LAB;  Service: Cardiovascular;  Laterality: N/A;   EYE SURGERY     FINGER SURGERY Right  2014   reattached middle finger   INSERTION PROSTATE RADIATION SEED  ~ 2011   IR IMAGING GUIDED PORT INSERTION  03/04/2019   IR RADIOLOGIST EVAL & MGMT  05/26/2020   IR US  GUIDE BX ASP/DRAIN  03/04/2019   LAPAROSCOPIC CHOLECYSTECTOMY  2015   LAPAROSCOPIC PARTIAL COLECTOMY N/A 07/26/2017   Procedure: LAPAROSCOPIC PARTIAL COLECTOMY, EXCISION SCROTAL CYST;  Surgeon: Tanda Locus, MD;  Location: [facility] ORS;  Service: General;  Laterality: N/A;   LEFT HEART CATH AND CORONARY ANGIOGRAPHY N/A 01/02/2019   Procedure: LEFT HEART CATH AND CORONARY ANGIOGRAPHY;  Surgeon: Dann Candyce RAMAN, MD;  Location: [facility] INVASIVE CV LAB;  Service: Cardiovascular;  Laterality: N/A;   LEFT HEART CATHETERIZATION WITH CORONARY ANGIOGRAM N/A 08/12/2014   Procedure: LEFT HEART CATHETERIZATION WITH CORONARY ANGIOGRAM;  Surgeon: Ozell JONETTA Fell, MD;  Location: [facility] CATH LAB;  Service: Cardiovascular;  Laterality: N/A;   LYMPH NODE BIOPSY     mid chest   MOHS SURGERY Right ~ 2011   eyelid; for basal cell   RADIOLOGY WITH ANESTHESIA N/A 06/30/2020   Procedure: IR WITH ANESTHESIA MICROWAVE ABLATION;  Surgeon: Alona Corners, DO;  Location: [facility] ORS;  Service: Anesthesiology;  Laterality: N/A;   RIGHT/LEFT HEART CATH AND CORONARY ANGIOGRAPHY N/A 04/25/2022   Procedure: RIGHT/LEFT HEART CATH AND CORONARY ANGIOGRAPHY;  Surgeon: Dann Candyce RAMAN, MD;  Location: [facility] INVASIVE CV LAB;  Service: Cardiovascular;  Laterality: N/A;   THORACENTESIS N/A 02/11/2021   Procedure: MILANA;  Surgeon: Annella Donnice SAUNDERS, MD;  Location: [facility] ENDOSCOPY;  Service: Pulmonary;  Laterality: N/A;   THORACENTESIS Right 04/04/2021   Procedure: THORACENTESIS;  Surgeon: Claudene Toribio BROCKS, MD;  Location: [facility] ENDOSCOPY;  Service: Pulmonary;  Laterality: Right;   THORACENTESIS Right 08/16/2021   Procedure: THORACENTESIS;  Surgeon: Harold Scholz, MD;  Location: [facility] ENDOSCOPY;  Service: Pulmonary;  Laterality: Right;   THORACENTESIS Right 11/29/2021   Procedure:  THORACENTESIS;  Surgeon: Kara Dorn NOVAK, MD;  Location: [facility] ENDOSCOPY;  Service: Pulmonary;  Laterality: Right;   THORACENTESIS N/A 03/17/2022   Procedure: MILANA;  Surgeon: Annella Donnice SAUNDERS, MD;  Location: [facility] ENDOSCOPY;  Service: Pulmonary;  Laterality: N/A;   THORACENTESIS N/A 07/14/2022   Procedure: MILANA;  Surgeon: Brenna Adine CROME, DO;  Location: [facility] ENDOSCOPY;  Service: Pulmonary;  Laterality: N/A;   THORACENTESIS N/A 10/11/2022   Procedure: MILANA;  Surgeon: Harold Scholz, MD;  Location: [facility] ENDOSCOPY;  Service: Pulmonary;  Laterality: N/A;   THOROCOTOMY WITH LOBECTOMY Right ~ 1991   partial removal right lung   TONSILLECTOMY  ~ 1950     Social History:   reports that he has quit smoking. His smoking use included cigars. He has never used smokeless tobacco. He reports current alcohol use of about 2.0 standard drinks of alcohol per week. He reports that he does not use drugs.   Family History:  His family history includes Coronary artery disease (age of onset: 26) in his sister; Heart attack in his sister; Heart disease (age of onset: 84) in his father. There is no history of Colon cancer, Throat cancer, Pancreatic cancer, Prostate cancer, Stroke, or Hypertension.   Allergies Allergies  Allergen Reactions   Losartan  Potassium Other (See Comments)    Hyperkalemia     Home Medications  Prior to Admission medications   Medication Sig Start Date End Date Taking? Authorizing Provider  albuterol  (PROVENTIL ) (2.5 MG/3ML) 0.083% nebulizer solution INHALE 3  ML BY NEBULIZATION EVERY 6 HOURS AS NEEDED FOR WHEEZING OR SHORTNESS OF BREATH 11/12/23  Yes Cobb, Comer GAILS, NP  albuterol  (VENTOLIN  HFA) 108 (90 Base) MCG/ACT inhaler INHALE 1-2 PUFFS BY MOUTH EVERY 6 HOURS AS NEEDED FOR WHEEZE OR SHORTNESS OF BREATH 07/03/22  Yes Hunsucker, Donnice SAUNDERS, MD  amoxicillin -clavulanate (AUGMENTIN ) 875-125 MG tablet Take 1 tablet by mouth 2 (two) times daily. 04/03/24  Yes Hope Almarie ORN, NP  apixaban (ELIQUIS) 5 MG TABS tablet Take 5 mg by mouth 2 (two) times daily. 01/17/24  Yes [provider]  budesonide -glycopyrrolate-formoterol (BREZTRI  AEROSPHERE) 160-9-4.8 MCG/ACT AERO inhaler Inhale 2 puffs into the lungs in the morning and at bedtime. 04/03/24  Yes Hope Almarie ORN, NP  cetirizine  (ZYRTEC ) 10 MG tablet Take 1 tablet (10 mg total) by mouth daily. Patient taking differently: Take 10 mg by mouth daily as needed for allergies. 09/13/23  Yes Meade Verdon RAMAN, MD  Cholecalciferol  (VITAMIN D3) 2000 units capsule Take 2,000 Units by mouth daily.   Yes [provider]  dronedarone  (MULTAQ ) 400 MG tablet Take 1 tablet (400 mg total) by mouth 2 (two) times daily with a meal. 02/12/24  Yes Ladona Heinz, MD  Dupilumab  (DUPIXENT ) 300 MG/2ML SOAJ Inject 300 mg into the skin every 14 (fourteen) days. 03/31/24  Yes Hunsucker, Donnice SAUNDERS, MD  ezetimibe  (ZETIA ) 10 MG tablet TAKE 1 TABLET BY MOUTH EVERY DAY 01/01/24  Yes Swinyer, Rosaline HERO, NP  fluticasone  (FLONASE ) 50 MCG/ACT nasal spray Place 2 sprays into both nostrils daily. 09/25/23  Yes Hunsucker, Donnice SAUNDERS, MD  furosemide  (LASIX ) 20 MG tablet Take 1 tablet (20 mg total) by mouth daily as needed for fluid or edema. Patient taking differently: Take 20 mg by mouth daily. 01/08/24  Yes Ladona Heinz, MD  gabapentin  (NEURONTIN ) 100 MG capsule Take 200 mg by mouth at bedtime.   Yes [provider]  guaiFENesin -dextromethorphan  (ROBITUSSIN DM) 100-10 MG/5ML syrup Take 10 mLs by mouth every 6 (six) hours as needed for cough.   Yes [provider]  ibuprofen (ADVIL) 200 MG tablet Take 400 mg by mouth every 6 (six) hours as needed for moderate pain (pain score 4-6) or mild pain (pain score 1-3).   Yes [provider]  levothyroxine  (SYNTHROID ) 50 MCG tablet Take 50 mcg by mouth daily before breakfast. 02/20/23  Yes [provider]  magnesium  oxide (MAG-OX) 400 MG tablet Take 1 tablet by mouth 2  (two) times daily.   Yes [provider]  metroNIDAZOLE (METROCREAM) 0.75 % cream Apply 1 Application topically 2 (two) times daily as needed (rash). 09/21/23  Yes [provider]  minocycline (MINOCIN) 100 MG capsule Take 100 mg by mouth daily. 12/09/23  Yes [provider]  montelukast  (SINGULAIR ) 10 MG tablet Take 1 tablet (10 mg total) by mouth at bedtime. 09/13/23  Yes Desai, Nikita S, MD  Multiple Vitamins-Minerals (VITRUM SENIOR) TABS Take 1 tablet by mouth daily.   Yes [provider]  Naphazoline-Pheniramine ([employer] EYE ALLERGY RELIEF) 0.027-0.315 % SOLN Place 1 drop into both eyes 2 (two) times daily.   Yes [provider]  naproxen  sodium (ALEVE ) 220 MG tablet Take 440 mg by mouth 2 (two) times daily as needed (pain).   Yes [provider]  potassium chloride  (KLOR-CON ) 10 MEQ tablet Take 10 mEq by mouth daily. 02/17/24 02/16/25 Yes [provider]  predniSONE  (DELTASONE ) 5 MG tablet Take 3 tablets (15mg ) daily 04/03/24  Yes Hope Almarie ORN, NP  rosuvastatin  (  CRESTOR ) 20 MG tablet TAKE 1 TABLET BY MOUTH EVERY DAY 02/07/24  Yes Ladona Heinz, MD  traMADol  (ULTRAM ) 50 MG tablet Take 2 tablets (100 mg total) by mouth every 6 (six) hours as needed. 05/29/21  Yes Ebbie Cough, MD  nitroGLYCERIN  (NITROSTAT ) 0.4 MG SL tablet PLACE 1 TABLET UNDER THE TONGUE EVERY 5 MINUTES AS NEEDED FOR CHEST PAIN FOR 3 DOSES Patient not taking: Reported on 04/07/2024 08/15/23   Lelon Hamilton T, PA-C  ranolazine  (RANEXA ) 500 MG 12 hr tablet TAKE 1 TABLET BY MOUTH TWICE A DAY Patient not taking: Reported on 04/07/2024 10/04/23   Lelon Hamilton DASEN, PA-C     Critical care time:       Leita SHAUNNA Gaskins, DO 04/10/24 1:31 PM [facility]  For contact information, see Amion. If no response to pager, please call PCCM consult pager. After hours, 7PM- 7AM, please call Elink.

## 2024-04-11 ENCOUNTER — Encounter (HOSPITAL_COMMUNITY): Admission: EM | Disposition: E | Payer: Self-pay | Source: Home / Self Care | Attending: Internal Medicine

## 2024-04-11 ENCOUNTER — Inpatient Hospital Stay (HOSPITAL_COMMUNITY): Payer: Self-pay | Admitting: Registered Nurse

## 2024-04-11 ENCOUNTER — Encounter (HOSPITAL_COMMUNITY): Payer: Self-pay | Admitting: Registered Nurse

## 2024-04-11 DIAGNOSIS — J189 Pneumonia, unspecified organism: Secondary | ICD-10-CM | POA: Diagnosis not present

## 2024-04-11 DIAGNOSIS — I2510 Atherosclerotic heart disease of native coronary artery without angina pectoris: Secondary | ICD-10-CM | POA: Diagnosis not present

## 2024-04-11 DIAGNOSIS — D849 Immunodeficiency, unspecified: Secondary | ICD-10-CM | POA: Insufficient documentation

## 2024-04-11 DIAGNOSIS — Z87891 Personal history of nicotine dependence: Secondary | ICD-10-CM | POA: Diagnosis not present

## 2024-04-11 DIAGNOSIS — J9601 Acute respiratory failure with hypoxia: Secondary | ICD-10-CM | POA: Diagnosis not present

## 2024-04-11 HISTORY — PX: VIDEO BRONCHOSCOPY: SHX5072

## 2024-04-11 LAB — BODY FLUID CELL COUNT WITH DIFFERENTIAL
Eos, Fluid: 0 %
Lymphs, Fluid: 34 %
Monocyte-Macrophage-Serous Fluid: 16 % — ABNORMAL LOW (ref 50–90)
Neutrophil Count, Fluid: 50 % — ABNORMAL HIGH (ref 0–25)
Total Nucleated Cell Count, Fluid: 44 uL (ref 0–1000)

## 2024-04-11 LAB — BASIC METABOLIC PANEL WITH GFR
Anion gap: 13 (ref 5–15)
BUN: 25 mg/dL — ABNORMAL HIGH (ref 8–23)
CO2: 25 mmol/L (ref 22–32)
Calcium: 8 mg/dL — ABNORMAL LOW (ref 8.9–10.3)
Chloride: 96 mmol/L — ABNORMAL LOW (ref 98–111)
Creatinine, Ser: 0.94 mg/dL (ref 0.61–1.24)
GFR, Estimated: >60 mL/min (ref >60)
Glucose, Bld: 127 mg/dL — ABNORMAL HIGH (ref 70–99)
Potassium: 3.2 mmol/L — ABNORMAL LOW (ref 3.5–5.1)
Sodium: 134 mmol/L — ABNORMAL LOW (ref 135–145)

## 2024-04-11 LAB — GLUCOSE, CAPILLARY
Glucose-Capillary: 114 mg/dL — ABNORMAL HIGH (ref 70–99)
Glucose-Capillary: 168 mg/dL — ABNORMAL HIGH (ref 70–99)
Glucose-Capillary: 270 mg/dL — ABNORMAL HIGH (ref 70–99)
Glucose-Capillary: 175 mg/dL — ABNORMAL HIGH (ref 70–99)

## 2024-04-11 LAB — CBC
HCT: 37.9 % — ABNORMAL LOW (ref 39.0–52.0)
Hemoglobin: 12.1 g/dL — ABNORMAL LOW (ref 13.0–17.0)
MCH: 30.9 pg (ref 26.0–34.0)
MCHC: 31.9 g/dL (ref 30.0–36.0)
MCV: 96.7 fL (ref 80.0–100.0)
Platelets: 127 K/uL — ABNORMAL LOW (ref 150–400)
RBC: 3.92 MIL/uL — ABNORMAL LOW (ref 4.22–5.81)
RDW: 17.4 % — ABNORMAL HIGH (ref 11.5–15.5)
WBC: 11.7 K/uL — ABNORMAL HIGH (ref 4.0–10.5)
nRBC: 0 % (ref 0.0–0.2)

## 2024-04-11 SURGERY — VIDEO BRONCHOSCOPY WITHOUT FLUORO
Anesthesia: General

## 2024-04-11 MED ORDER — SUCCINYLCHOLINE CHLORIDE 200 MG/10ML IV SOSY
PREFILLED_SYRINGE | INTRAVENOUS | Status: DC | PRN
  Administered 2024-04-11: 150 mg via INTRAVENOUS

## 2024-04-11 MED ORDER — ALBUTEROL SULFATE (2.5 MG/3ML) 0.083% IN NEBU
2.5000 mg | INHALATION_SOLUTION | Freq: Once | RESPIRATORY_TRACT | Status: AC
  Administered 2024-04-11: 2.5 mg via RESPIRATORY_TRACT

## 2024-04-11 MED ORDER — PROPOFOL 10 MG/ML IV BOLUS
INTRAVENOUS | Status: DC | PRN
  Administered 2024-04-11: 120 mg via INTRAVENOUS

## 2024-04-11 MED ORDER — ALBUTEROL SULFATE (2.5 MG/3ML) 0.083% IN NEBU
INHALATION_SOLUTION | RESPIRATORY_TRACT | Status: AC
  Filled 2024-04-11: qty 3

## 2024-04-11 MED ORDER — FENTANYL CITRATE (PF) 100 MCG/2ML IJ SOLN
INTRAMUSCULAR | Status: AC
  Filled 2024-04-11: qty 2

## 2024-04-11 MED ORDER — POTASSIUM CHLORIDE CRYS ER 20 MEQ PO TBCR
40.0000 meq | EXTENDED_RELEASE_TABLET | Freq: Once | ORAL | Status: AC
  Administered 2024-04-11: 40 meq via ORAL
  Filled 2024-04-11: qty 2

## 2024-04-11 MED ORDER — PHENYLEPHRINE 80 MCG/ML (10ML) SYRINGE FOR IV PUSH (FOR BLOOD PRESSURE SUPPORT)
PREFILLED_SYRINGE | INTRAVENOUS | Status: DC | PRN
  Administered 2024-04-11 (×2): 160 ug via INTRAVENOUS

## 2024-04-11 MED ORDER — SALINE SPRAY 0.65 % NA SOLN
1.0000 | NASAL | Status: DC | PRN
  Administered 2024-04-11: 1 via NASAL
  Filled 2024-04-11: qty 44

## 2024-04-11 MED ORDER — LACTATED RINGERS IV SOLN: INTRAVENOUS | Status: DC | PRN

## 2024-04-11 NOTE — Progress Notes (Signed)
 PT Cancellation Note  Patient Details Name: Luis [name] MRN: [id number] DOB: May 05, 1941   Cancelled Treatment:    Reason Eval/Treat Not Completed: Patient at procedure or test/unavailable (Pt off floor for bronchoscopy. Will follow-up for PT treatment as schedule permits.)  Randall SAUNDERS, PT, DPT Acute Rehabilitation Services Office: [phone] Secure Chat Preferred  Luis [name] 04/11/2024, 12:24 PM

## 2024-04-11 NOTE — Progress Notes (Signed)
 Physical Therapy Treatment Patient Details Name: Luis [name] MRN: [id number] DOB: November 18, 1940 Today's Date: 04/11/2024   History of Present Illness 83 y.o. male presents to [facility] hospital on 04/07/2024 with progressive SOB. Pt underwent bronchoscopy with biopsies 10/31. PMH includes metastatic colon CA with mets to lungs, liver, and lymph nodes, sarcoidosis, COPD, CAD, HFpEF, HTN, HLD, hypothyroidism.    PT Comments  Pt is making steady progress towards his acute PT goals demonstrated by advanced functional mobility with decreased physical assistance. Introduced occupational hygienist and educate pt on proper and safe use of AD. He required minimal cues for locking/unlocking breaks and sequencing. Pt increased gait distance and took one seated rest break and one standing rest break. He c/o feeling woozy during ambulation, assessed vitals with BP 129/45 (70), HR 66 seated and BP 137/49 (72), HR 99 standing. Maintained pt on 6L O2 via [place]. PT continues to have difficulty obtaining a consistently reliable pulse ox reading during session on portable pulse ox and dynamap. Pt will benefit from acute skilled PT to increase his independence and safety with mobility to allow discharge.       If plan is discharge home, recommend the following: Assistance with cooking/housework;Assist for transportation;Help with stairs or ramp for entrance   Can travel by private vehicle        Equipment Recommendations  None recommended by PT    Recommendations for Other Services       Precautions / Restrictions Precautions Precautions: Fall Recall of Precautions/Restrictions: Intact Precaution/Restrictions Comments: Monitor SpO2; unable to get a consistently accurate reading despite multiple methods including portable pulse ox and dynamap Restrictions Weight Bearing Restrictions Per Provider Order: No     Mobility  Bed Mobility               General bed mobility comments: Not assessed. Pt greeted seated in  recliner chair and returned there at end of session.    Transfers Overall transfer level: Needs assistance Equipment used: Rolling walker (2 wheels) Transfers: Sit to/from Stand Sit to Stand: Supervision           General transfer comment: Pt stood from recliner chair. He scooted fwd to edge of surface and increaed B knee flex. Cued pt to lock rollator breaks. He powered up without physical assist. Good eccentric control.    Ambulation/Gait Ambulation/Gait assistance: Contact guard assist, +2 safety/equipment Gait Distance (Feet): 80 Feet (1x80, seated rest break, 1x80, standing rest break, 1x60) Assistive device: Rollator (4 wheels) Gait Pattern/deviations: Step-through pattern, Decreased stride length Gait velocity: reduced Gait velocity interpretation: <1.8 ft/sec, indicate of risk for recurrent falls   General Gait Details: Pt ambulated with a reciprocal gait pattern, even weight shift, reduced step length, and adequate foot clearence. He navigated room/hallway well. Pt self-selected when he needed to take rest breaks. He utilized PLB technique throughout mobility.   Stairs             Wheelchair Mobility     Tilt Bed    Modified Rankin (Stroke Patients Only)       Balance Overall balance assessment: Needs assistance Sitting-balance support: No upper extremity supported, Feet supported Sitting balance-Leahy Scale: Good     Standing balance support: Bilateral upper extremity supported, During functional activity, Reliant on assistive device for balance Standing balance-Leahy Scale: Poor Standing balance comment: Pt dependent on AD  Communication Communication Communication: No apparent difficulties  Cognition Arousal: Alert Behavior During Therapy: WFL for tasks assessed/performed   PT - Cognitive impairments: No apparent impairments                         Following commands: Intact      Cueing  Cueing Techniques: Verbal cues  Exercises Other Exercises Other Exercises: Incentive Spirometer x10 reps with pt reaching 1,069mL consistently.    General Comments General comments (skin integrity, edema, etc.): Pt greeted on 6L O2 via [place], SpO2 in the mid-80s. Utilized portable pulse ox and dynamap throughout session, both incosistently reliable. Pt reports a finger nail fungus on R hand, which could potentially have impact on pleth as well as cold hands. Pt ambulates on 6L O2 via [place] with sats ranging from 79-89%. Pt utilized PLB technique throughout. He c/o feeling a little woozy, assessed vitals during seated rest break, BP 129/45 (70), HR 60. Pt denied dizziness/lightheadedness. Assessed again in standing with BP 137/49 (72) ansd HR 99.      Pertinent Vitals/Pain Pain Assessment Pain Assessment: No/denies pain    Home Living                          Prior Function            PT Goals (current goals can now be found in the care plan section) Acute Rehab PT Goals Patient Stated Goal: Improve activity tolerance and not need oxygen PT Goal Formulation: With patient/family Time For Goal Achievement: 04/22/24 Potential to Achieve Goals: Fair Progress towards PT goals: Progressing toward goals    Frequency    Min 2X/week      PT Plan      Co-evaluation              AM-PAC PT 6 Clicks Mobility   Outcome Measure  Help needed turning from your back to your side while in a flat bed without using bedrails?: A Little Help needed moving from lying on your back to sitting on the side of a flat bed without using bedrails?: A Little Help needed moving to and from a bed to a chair (including a wheelchair)?: A Little Help needed standing up from a chair using your arms (e.g., wheelchair or bedside chair)?: A Little Help needed to walk in hospital room?: A Little Help needed climbing 3-5 steps with a railing? : A Little 6 Click Score: 18    End of Session Equipment  Utilized During Treatment: Gait belt;Oxygen Activity Tolerance: Patient tolerated treatment well;Patient limited by fatigue Patient left: in chair;with call bell/phone within reach Nurse Communication: Mobility status PT Visit Diagnosis: Other abnormalities of gait and mobility (R26.89)     Time: 1347-1410 PT Time Calculation (min) (ACUTE ONLY): 23 min  Charges:    $Gait Training: 23-37 mins PT General Charges $$ ACUTE PT VISIT: 1 Visit                     Randall SAUNDERS, PT, DPT Acute Rehabilitation Services Office: [phone] Secure Chat Preferred  Luis [name] 04/11/2024, 5:06 PM

## 2024-04-11 NOTE — Interval H&P Note (Signed)
 History and Physical Interval Note:  04/11/2024 12:42 PM  Carlin KANDICE Fireman  has presented today for surgery, with the diagnosis of immunocompromised, sarcoidosis, acute respiratory failure with hypoxia, left lung pneumonia.  The various methods of treatment have been discussed with the patient and family. After consideration of risks, benefits and other options for treatment, the patient has consented to  Procedure(s): VIDEO BRONCHOSCOPY WITHOUT FLUORO (N/A) as a surgical intervention.  The patient's history has been reviewed, patient examined, no change in status, stable for surgery.  I have reviewed the patient's chart and labs.  Questions were answered to the patient's satisfaction.    Planning for bronch with BAL of left lung for cell count with diff, cultures, PJP DFA. He agrees and signed consent.   BP 128/69   Pulse 88   Temp 98 F (36.7 C) (Oral)   Resp (!) 25   Ht 5' 11 (1.803 m)   Wt 97.7 kg   SpO2 (!) 9%   BMI 30.04 kg/m  Awake, alert Breathing comfortably on 4L , faint left sided rhales Answering questions appropriately.   Leita SHAUNNA Gaskins

## 2024-04-11 NOTE — Anesthesia Procedure Notes (Signed)
 Procedure Name: Intubation Date/Time: 04/11/2024 12:58 PM  Performed by: Christopher Comings, CRNAPre-anesthesia Checklist: Patient identified, Emergency Drugs available, Suction available and Patient being monitored Patient Re-evaluated:Patient Re-evaluated prior to induction Oxygen Delivery Method: Circle system utilized Preoxygenation: Pre-oxygenation with 100% oxygen Induction Type: IV induction Ventilation: Mask ventilation without difficulty Laryngoscope Size: Mac and 4 Grade View: Grade I Tube type: Oral Tube size: 8.0 mm Number of attempts: 1 Airway Equipment and Method: Stylet and Oral airway Placement Confirmation: ETT inserted through vocal cords under direct vision, positive ETCO2 and breath sounds checked- equal and bilateral Secured at: 23 cm Tube secured with: Tape Dental Injury: Teeth and Oropharynx as per pre-operative assessment

## 2024-04-11 NOTE — Progress Notes (Signed)
 PROGRESS NOTE TUFF CLABO  FMW:[id number] DOB: 1940/08/07 DOA: 04/07/2024 PCP: [name], MD  Brief Narrative/Hospital Course: Luis [name] is a 83 y.o. male with PMH of  metastatic colon cancer, mets to lungs, liver, LN's, sarcoidosis, COPD, CAD hx PCI, HFpEF, hypertension, hyperlipidemia, hypothyroidism, who presented with progressive shortness of breath onset x 1 wk  with  exertional dyspnea, chest congestion, minimally productive cough with clear and scant blood-tinged sputum, seen by pulmonology and was started on Augmentin  on Tuesday but by Thursday his symptoms started worsening and progressively worsened over the weekend.  He ended up checking his pulse oximeter at home which was reading in the high 60s so he presented to the ED  In the ED tachypneic, hypoxic,labs-mag low 1.5 hyperglycemic 291 lactic acidosis 3.3 Pro-Cal 1.5 leukocytosis 14.5, A1c 7.1, high sensitive troponin 29 proBNP 249 EKG sinus rhythm LAD LVH prolonged Qtc, situational up to 75% room air given 4 L nasal cannula Chest x-ray>> right lung base increased patchy airspace opacities and new left infrahilar airspace opacities concerning for multifocal pneumonia small right pleural effusion CT chest>> Postsurgical and radiation changes in the right lower lobe. Multifocal ground glass opacity with mosaic attenuation in the left lung, favoring mild multifocal infection/pneumonia, although pneumonitis related to immunotherapy is also possible.  Stable underlying sequela of sarcoidosis. Patient admitted for acute hypoxic respiratory failure with concern for multifocal pneumonia in the setting of metastatic colon cancer Home Eliquis continued, Lasix  trial given along with increased dose of prednisone  without significant improvement 10/3o Pulm consulted as patient dyspnea worsened on 10/30 10/31 planning for bronchoscopy  Subjective: Seen and examined Complains of dyspnea with minimal activity at rest is doing  fine Overnight remains afebrile BP stable still on 4 L nasal cannula Labs shows stable renal function potassium 3.2 CBC with hemoglobin stable 12 and WBC 11 plt 127 Urine output 1250 cc  wt on admission 219>212>215lb  Assessment and plan:  Acute respiratory failure with hypoxia  New left lung patchy GGO concerning for acute inflammation or infection-multifocal pneumonia versus pneumonitis Immunocompromise status in the setting of chemotherapy History of sarcoidosis on long-term steroid: CT chest:Multifocal ground glass opacity with mosaic attenuation in the left lung, favoring mild multifocal infection/pneumonia, although pneumonitis related to immunotherapy is also possible Blood culture from admission NGTD. Reports being on steroid for last 2 weeks 20 mg of prednisone  here needing 4 L [place] ON Eliquis PTA-less likelihood of PE. Echo showed normal EF.  70 to 75% G1 DD RV SF is normal RV size is normal Patient continued on IV antibiotics ceftriaxone /Zithromycin along with steroid. Does have edematous legs and started on IV diuresis 10/29 from p.o. bnp was up 237 Initially felt some better again had worsening dyspnea 10/30 still needing 4 L nasal cannula, at baseline on room air at home Continue prednisone  40 mg, IV Lasix  breast daily and bronchodilators, antitussuves PCCM consulted plan for bronchoscopy BAL 10/31   Acute on chronic HFpEF Chronic leg edema: Oral Lasix  has been changed to IV continue same weight a week ago was 216 pound He has been intentionally losing weight before that he was 240.Weight at 219 pound on admission now 212>215. Cont to monitor daily I/O,weight, electrolytes and net balance as below.Keep on  salt/fluid restricted diet and monitor in tele. Net IO Since Admission: -820 mL [04/11/24 1032]  Filed Weights   04/09/24 1113 04/10/24 0500 04/11/24 0622  Weight: 95.8 kg 96.3 kg 97.7 kg    Recent Labs  Lab 04/07/24  9963 04/07/24 9557 04/08/24 0233 04/09/24 0510  04/09/24 0816 04/10/24 0419 04/11/24 0407  BNP 249.3*  --   --   --  237.9*  --   --   BUN 14 13 19 18   --  19 25*  CREATININE 0.89 0.79 0.79 0.79  --  0.87 0.94  K 5.5* 4.2 4.1 4.1  --  3.5 3.2*  MG  --  1.5* 1.8  --   --   --   --    Elevated troponin: suspect demand ischemia with flat troponin. TTE is unremarkable  Hypokalemia Hypomagnesemia: Replace potassium mag stable  Lactic acidosis: Likely in the setting of respiratory failure-volume depletion hyperglycemia  New onset type 2 diabetes with uncontrolled hyperglycemia: A1c at 7.1.  Blood sugar up to 400-diet changed to carb controlled diet,started on glipizide and metformin ssi Blood sugar fairly controlled.  Suspect in the setting of his steroid use as well Recent Labs  Lab 04/07/24 0442 04/07/24 0800 04/10/24 0842 04/10/24 1226 04/10/24 1714 04/10/24 2106 04/11/24 0802  GLUCAP  --    < > 106* 195* 341* 224* 114*  HGBA1C 7.1*  --   --   --   --   --   --    < > = values in this interval not displayed.    Metastatic colon cancer, R hemicolectomy, mets to lungs, liver, LN's: Followed by [facility], has known RUL nodule and opacities in the R base ? Lymphangitic spread v. Inflammation/infection (last in 6/'25). He is currently on maintenance 5FU and Panitumumab  every 2 weeks.  Sarcoidosis: Follows with pulmonology, appears that he is currently on chronic steroids   PHTN: Suggested on imaging, noted   HTN HLD CAD hx PCI: Bp stable, no chest pain. Cont his DOAC, rosuvastatin , zetia ,  Ranolazine    Aflutter: Rate controlled. Cont home Apixaban, Dronedarone    Aortic ectasia: OP surveillance   Hypothyroidism: Continue home levothyroxine    Chronic anemia:  Stable at baseline.B12 and folate normal.  Thrombocytopenia: Monitor platelet count, question if from chemo  ?Chronic pain: Continue home Gabapentin    ?rosacea: continue home Minocycline.   Class  I Obesity w/ Body mass index is 30.04  kg/m.: will benefit with PCP follow-up, weight loss,healthy lifestyle and outpatient sleep eval if not done.  Mobility: PT OT requested PT Orders: Active  PT Follow up Rec: Outpatient Pt (Pulmonary Rehab)04/10/2024 0800    DVT prophylaxis:  Code Status:   Code Status: Prior Family Communication: plan of care discussed with patient at bedside. Patient status is: Remains hospitalized because of severity of illness Level of care: Telemetry Medical   Dispo: The patient is from: home            Anticipated disposition: TBD Objective: Vitals last 24 hrs: Vitals:   04/10/24 2212 04/11/24 0622 04/11/24 0750 04/11/24 0941  BP: 130/71 (!) 145/82  (!) 148/72  Pulse: 74 (!) 55 (!) 58 83  Resp:  18  16  Temp: 98.2 F (36.8 C) (!) 97.5 F (36.4 C)  98 F (36.7 C)  TempSrc: Oral Oral  Oral  SpO2: 94% 96% 95% 92%  Weight:  97.7 kg    Height:       Physical Examination: General exam: aaox3, opese HEENT:Oral mucosa moist, Ear/Nose WNL grossly Respiratory system: Bilaterally diminished at base, clear upper airways, some use of accessory muscle on activity Cardiovascular system: S1 & S2 +, No JVD. Gastrointestinal system: Abdomen soft,NT,ND, BS+ Nervous System: Alert, awake, moving all extremities,and following commands.  Extremities: extremities warm, leg edema  ++ Skin: Warm, no rashes MSK: Normal muscle bulk,tone, power   Medications reviewed:  Scheduled Meds:  apixaban  5 mg Oral BID   budesonide -glycopyrrolate-formoterol  2 puff Inhalation BID   dronedarone   400 mg Oral BID WC   ezetimibe   10 mg Oral Daily   furosemide   40 mg Intravenous BID   gabapentin   200 mg Oral QHS   glipiZIDE  2.5 mg Oral QAC breakfast   guaiFENesin   600 mg Oral BID   insulin aspart  0-15 Units Subcutaneous TID WC   levothyroxine   50 mcg Oral Q0600   metFORMIN  500 mg Oral BID WC   minocycline  100 mg Oral Daily   montelukast   10 mg Oral QHS   potassium chloride   40 mEq Oral Once   predniSONE   40 mg  Oral Q breakfast   ranolazine   500 mg Oral BID   rosuvastatin   20 mg Oral Daily   Continuous Infusions:   Diet: Diet Order             Diet NPO time specified  Diet effective midnight                    Data Reviewed: I have personally reviewed following labs and imaging studies ( see epic result tab) CBC: Recent Labs  Lab 04/07/24 0442 04/08/24 0233 04/09/24 0816 04/10/24 0419 04/11/24 0407  WBC 14.5* 14.5* 15.1* 12.9* 11.7*  HGB 11.1* 11.1* 12.1* 12.2* 12.1*  HCT 35.3* 34.6* 37.8* 38.4* 37.9*  MCV 99.2 99.1 98.7 98.0 96.7  PLT 125* 108* 149* 131* 127*   CMP: Recent Labs  Lab 04/07/24 0442 04/08/24 0233 04/09/24 0510 04/10/24 0419 04/11/24 0407  NA 133* 133* 134* 137 134*  K 4.2 4.1 4.1 3.5 3.2*  CL 99 98 101 96* 96*  CO2 20* 23 18* 27 25  GLUCOSE 291* 113* 148* 99 127*  BUN 13 19 18 19  25*  CREATININE 0.79 0.79 0.79 0.87 0.94  CALCIUM  8.1* 8.4* 8.3* 8.3* 8.0*  MG 1.5* 1.8  --   --   --   PHOS 2.7  --   --   --   --    GFR: Estimated Creatinine Clearance: 71 mL/min (by C-G formula based on SCr of 0.94 mg/dL). No results for input(s): AST, ALT, ALKPHOS, BILITOT, PROT, ALBUMIN in the last 168 hours. No results for input(s): LIPASE, AMYLASE in the last 168 hours. No results for input(s): AMMONIA in the last 168 hours. Coagulation Profile:  Recent Labs  Lab 04/07/24 0036  INR 1.3*   Unresulted Labs (From admission, onward)     Start     Ordered   04/08/24 0500  Basic metabolic panel with GFR  Daily,   R      04/07/24 0737   04/08/24 0500  CBC  Daily,   R      04/07/24 0737           Antimicrobials/Microbiology: Anti-infectives (From admission, onward)    Start     Dose/Rate Route Frequency Ordered Stop   04/08/24 1000  azithromycin  (ZITHROMAX ) tablet 500 mg        500 mg Oral Daily 04/07/24 0238 04/09/24 0852   04/08/24 0000  cefTRIAXone  (ROCEPHIN ) 1 g in sodium chloride  0.9 % 100 mL IVPB        1 g 200 mL/hr over 30  Minutes Intravenous Every 24 hours 04/07/24 0238 04/11/24 0815   04/07/24 1000  minocycline (MINOCIN) capsule 100 mg        100 mg Oral Daily 04/07/24 0241     04/07/24 0130  cefTRIAXone  (ROCEPHIN ) 2 g in sodium chloride  0.9 % 100 mL IVPB        2 g 200 mL/hr over 30 Minutes Intravenous  Once 04/07/24 0124 04/07/24 0255   04/07/24 0130  azithromycin  (ZITHROMAX ) 500 mg in sodium chloride  0.9 % 250 mL IVPB        500 mg 250 mL/hr over 60 Minutes Intravenous  Once 04/07/24 0124 04/07/24 0337         Component Value Date/Time   SDES EXPECTORATED SPUTUM 04/10/2024 1446   SPECREQUEST NONE 04/10/2024 1446   CULT  04/07/2024 0153    NO GROWTH 4 DAYS Performed at [facility], [address], [place], [name] [address]    REPTSTATUS 04/10/2024 FINAL 04/10/2024 1446    Procedures: Procedure(s) (LRB): VIDEO BRONCHOSCOPY WITHOUT FLUORO (N/A) Mennie LAMY, MD [facility] 04/11/2024, 10:32 AM

## 2024-04-11 NOTE — Transfer of Care (Signed)
 Immediate Anesthesia Transfer of Care Note  Patient: Luis [name]  Procedure(s) Performed: VIDEO BRONCHOSCOPY WITHOUT FLUORO  Patient Location: PACU and Endoscopy Unit  Anesthesia Type:General  Level of Consciousness: drowsy  Airway & Oxygen Therapy: Patient connected to face mask oxygen  Post-op Assessment: Report given to RN and Post -op Vital signs reviewed and stable  Post vital signs: Reviewed and stable  Last Vitals:  Vitals Value Taken Time  BP 116/61 04/11/24 13:21  Temp 36.2 C 04/11/24 13:19  Pulse 79 04/11/24 13:19  Resp 18 04/11/24 13:23  SpO2 99 % 04/11/24 13:19  Vitals shown include unfiled device data.  Last Pain:  Vitals:   04/11/24 1319  TempSrc: Temporal  PainSc:          Complications: There were no known notable events for this encounter.

## 2024-04-11 NOTE — Op Note (Signed)
 Video Bronchoscopy Procedure Note  Date of Operation: 04/11/2024  Pre-op Diagnosis: immunocompromised, sarcoidosis, acute respiratory failure  Post-op Diagnosis: Same  Surgeon: Leita SHAUNNA Gaskins  Assistants: none  Anesthesia: per anesthesia record  Meds Given: per [facility]  Operation: Flexible video fiberoptic bronchoscopy and biopsies.  Estimated Blood Loss: <5cc  Complications: none noted  Indications and History: Luis [name] is 83 y.o. with history of pulmonary sarcoidosis in remission, acute respiratory failure, cough, DOE.   Recommendation was to perform video fiberoptic bronchoscopy with biopsies. The risks, benefits, complications, treatment options and expected outcomes were discussed with the patient.  The possibilities of pneumothorax, pneumonia, reaction to medication, pulmonary aspiration, perforation of a viscus, bleeding, failure to diagnose a condition and creating a complication requiring transfusion or operation were discussed with the patient who freely signed the consent.    Description of Procedure: The patient was seen in the Preoperative Area, was examined and was deemed appropriate to proceed.  The patient was taken to endoscopy, identified as Luis [name] and the procedure verified as Flexible Video Fiberoptic Bronchoscopy.  A Time Out was held and the above information confirmed.   General anesthesia was initiated as indicated above. The video fiberoptic bronchoscope was introduced via the ETT and a general inspection was performed which showed normal distal trachea, normal main carina. The R sided airways were inspected and showed normal RUL, BI, RML and RLL. The L side was then inspected. The LLL, Lingular and LUL airways were normal.   BAL LUL anterior segment was performed with return of cloudy, light pink fluid. The patient tolerated the procedure well. Minimal mucosal bleeding from suction trauma, not active bleeding at the termination of the case. The  bronchoscope was removed. There were no obvious complications.   Samples: 1.  Bronchoalveolar lavage from LUL-- sent for cell count with diff, PJP DFA, cultures (fungal, AFB, routine), cytology.  Plans:  We will review the cytology, pathology and microbiology results with the patient when they become available.  Outpatient followup will be with Dr Annella.    Leita SHAUNNA Gaskins, DO 04/11/24 1:16 PM [facility]  For contact information, see Amion. If no response to pager, please call PCCM consult pager. After hours, 7PM- 7AM, please call Elink.

## 2024-04-12 DIAGNOSIS — J9601 Acute respiratory failure with hypoxia: Secondary | ICD-10-CM | POA: Diagnosis not present

## 2024-04-12 DIAGNOSIS — R918 Other nonspecific abnormal finding of lung field: Secondary | ICD-10-CM | POA: Diagnosis not present

## 2024-04-12 DIAGNOSIS — J188 Other pneumonia, unspecified organism: Principal | ICD-10-CM | POA: Insufficient documentation

## 2024-04-12 LAB — CBC
HCT: 39.4 % (ref 39.0–52.0)
HCT: 38.6 % — ABNORMAL LOW (ref 39.0–52.0)
Hemoglobin: 12.5 g/dL — ABNORMAL LOW (ref 13.0–17.0)
Hemoglobin: 12.1 g/dL — ABNORMAL LOW (ref 13.0–17.0)
MCH: 31 pg (ref 26.0–34.0)
MCH: 30.4 pg (ref 26.0–34.0)
MCHC: 31.7 g/dL (ref 30.0–36.0)
MCHC: 31.3 g/dL (ref 30.0–36.0)
MCV: 97.8 fL (ref 80.0–100.0)
MCV: 97 fL (ref 80.0–100.0)
Platelets: 161 K/uL (ref 150–400)
Platelets: 162 K/uL (ref 150–400)
RBC: 4.03 MIL/uL — ABNORMAL LOW (ref 4.22–5.81)
RBC: 3.98 MIL/uL — ABNORMAL LOW (ref 4.22–5.81)
RDW: 17.3 % — ABNORMAL HIGH (ref 11.5–15.5)
RDW: 17.2 % — ABNORMAL HIGH (ref 11.5–15.5)
WBC: 12.1 K/uL — ABNORMAL HIGH (ref 4.0–10.5)
WBC: 14.5 K/uL — ABNORMAL HIGH (ref 4.0–10.5)
nRBC: 0 % (ref 0.0–0.2)
nRBC: 0 % (ref 0.0–0.2)

## 2024-04-12 LAB — CULTURE, BLOOD (ROUTINE X 2)
Culture: NO GROWTH
Culture: NO GROWTH
Special Requests: ADEQUATE

## 2024-04-12 LAB — GLUCOSE, CAPILLARY
Glucose-Capillary: 117 mg/dL — ABNORMAL HIGH (ref 70–99)
Glucose-Capillary: 219 mg/dL — ABNORMAL HIGH (ref 70–99)
Glucose-Capillary: 162 mg/dL — ABNORMAL HIGH (ref 70–99)
Glucose-Capillary: 216 mg/dL — ABNORMAL HIGH (ref 70–99)

## 2024-04-12 LAB — BASIC METABOLIC PANEL WITH GFR
Anion gap: 15 (ref 5–15)
BUN: 28 mg/dL — ABNORMAL HIGH (ref 8–23)
CO2: 26 mmol/L (ref 22–32)
Calcium: 8.2 mg/dL — ABNORMAL LOW (ref 8.9–10.3)
Chloride: 94 mmol/L — ABNORMAL LOW (ref 98–111)
Creatinine, Ser: 0.99 mg/dL (ref 0.61–1.24)
GFR, Estimated: >60 mL/min (ref >60)
Glucose, Bld: 169 mg/dL — ABNORMAL HIGH (ref 70–99)
Potassium: 3.7 mmol/L (ref 3.5–5.1)
Sodium: 135 mmol/L (ref 135–145)

## 2024-04-12 MED ORDER — SODIUM CHLORIDE 0.9 % IV SOLN
2.0000 g | INTRAVENOUS | Status: AC
  Administered 2024-04-12 – 2024-04-20 (×9): 2 g via INTRAVENOUS
  Filled 2024-04-12 (×9): qty 20

## 2024-04-12 MED ORDER — SODIUM CHLORIDE 0.9 % IV SOLN
100.0000 mg | Freq: Two times a day (BID) | INTRAVENOUS | Status: DC
  Administered 2024-04-12 – 2024-04-13 (×2): 100 mg via INTRAVENOUS
  Filled 2024-04-12 (×3): qty 100

## 2024-04-12 NOTE — Progress Notes (Signed)
 NAME:  Luis [name], MRN:  [id number], DOB:  August 31, 1940, LOS: 5 ADMISSION DATE:  04/07/2024, CONSULTATION DATE:  10/30 REFERRING MD:  [facility]-TR[facility], CHIEF COMPLAINT:  acute resp failure   History of Present Illness:  Luis [name] is an 83 y/o gentleman with a history of pulmonary sarcoidosis on chronic steriods, COPD-asthma overlap on triple inhaled therapy & Dupixent , stage IV cecal adenocarcinoma being treated at [facility] since 2019 who presented to the hospital with progressive shortness of breath and dry cough over 4 days on 04/07/2024.  He had been seen in pulmonary clinic today following his symptoms starting on 7/23.  He had an exacerbation of his respiratory disease in March and September of this year.  Since his exacerbation in September he had gone back on prednisone , tapering from 30 mg to 20 mg to 10 mg daily.  When he got back down to 10 mg daily dose he felt his symptoms flared up again, similar to tapers done in the 1990s when he was on chronic steroids for sarcoidosis.  He has only been on intermittent steroids since 1990s; he has not been on steroid sparing DMARD therapy due to quiescent sarcoidosis over the years.  He has not had sick contacts recently.  No new symptoms other than some leg swelling consistent with hospital.  No fevers.  He has had green sputum production in the hospital, but did not have sputum at home.  He is not on home oxygen.  He last had chemotherapy 2 weeks ago-pembrolizumab and 5-fluorouracil .  He felt well after this for a few days before starting to feel run down.  His meds are not to take primary support.  He is on Eliquis for DVT diagnosed in late July 2025 & Afib.  Patient hospital because required 4 to 6 L nasal cannula.  He has been maintained on azithromycin , ceftriaxone , prednisone  40mg  daily. Overall he felt a little better yesterday but is more dyspneic again today.   Pertinent  Medical History  Afib, recent cardioversion Metastatic adenocarcinoma of  cecum Pulmonary sarcoidosis diagnosed by open lung biopsy in 1990s COPD asthma overlap Coronary artery disease HTN HLD  Significant Hospital Events: Including procedures, antibiotic start and stop dates in addition to other pertinent events   10/26 admitted to the hospital, started on ceftriaxone  and azithromycin , prednisone  40 mg daily 10/29 completed azithromycin  10/30 stopped ceftriaxone   Interim History / Subjective:  Feels better today than yesterday. Coughing less.   Objective    Blood pressure (!) 146/65, pulse 96, temperature 97.8 F (36.6 C), temperature source Oral, resp. rate 18, height 5' 11 (1.803 m), weight 97.7 kg, SpO2 93%.        Intake/Output Summary (Last 24 hours) at 04/12/2024 1428 Last data filed at 04/12/2024 0815 Gross per 24 hour  Intake 720 ml  Output 1600 ml  Net -880 ml   Filed Weights   04/10/24 0500 04/11/24 0622 04/11/24 1221  Weight: 96.3 kg 97.7 kg 97.7 kg    Examination: General: chronically ill appearing man sitting up in the recliner in NAD HENT: [place]/AT, eyes anicteric Lungs: rhales on the left, CTA on the R. No conversational dyspnea.  Breathing comfortably on [place].  Cardiovascular: S1S2, RRR Neuro: awake, alert, moving extremities, answering questions appropriately.  RVP negative COVID-negative  WBC 14.5 BAL cell count 44 cells, 50% PMNs Resp culture:   BAL culture: few PMNs, no organisms seen Blood cultures NGTD  KOH prep, fungal culture pending AFB pending PJP DFA pending  Resolved problem  list   Assessment and Plan   Acute respiratory failure with hypoxia with new left lung patchy ground glass opacities concerning for acute inflammation or infection.  Most likely cause would be something infectious with unilateral presentation; with mild neurophilia on BAL suspect he is resolving a bacterial lung infection. BAL was unfortunately done after 5 days of antibiotics.   Although pembrolizumab associated lung toxicity can  present with ground glass opacity, it would be more common to see this bilaterally.  Being on steroids for about 5 weeks does increase his risk for opportunistic infections.  -Bronch cultures reviewed > restart antibiotics. With steroids it may take him longer to clear an infection. -Follow cultures- routine, fungal, AFB, and PJP DFA pending   Wife updated at bedside during rounds with patient. We will follow up again on Monday.   Labs   CBC: Recent Labs  Lab 04/09/24 0816 04/10/24 0419 04/11/24 0407 04/12/24 0626 04/12/24 1056  WBC 15.1* 12.9* 11.7* 12.1* 14.5*  HGB 12.1* 12.2* 12.1* 12.5* 12.1*  HCT 37.8* 38.4* 37.9* 39.4 38.6*  MCV 98.7 98.0 96.7 97.8 97.0  PLT 149* 131* 127* 161 162    Basic Metabolic Panel: Recent Labs  Lab 04/07/24 0442 04/08/24 0233 04/09/24 0510 04/10/24 0419 04/11/24 0407 04/12/24 0840  NA 133* 133* 134* 137 134* 135  K 4.2 4.1 4.1 3.5 3.2* 3.7  CL 99 98 101 96* 96* 94*  CO2 20* 23 18* 27 25 26   GLUCOSE 291* 113* 148* 99 127* 169*  BUN 13 19 18 19  25* 28*  CREATININE 0.79 0.79 0.79 0.87 0.94 0.99  CALCIUM  8.1* 8.4* 8.3* 8.3* 8.0* 8.2*  MG 1.5* 1.8  --   --   --   --   PHOS 2.7  --   --   --   --   --       Critical care time:       Leita SHAUNNA Gaskins, DO 04/12/24 5:55 PM [facility]  For contact information, see Amion. If no response to pager, please call PCCM consult pager. After hours, 7PM- 7AM, please call Elink.

## 2024-04-12 NOTE — Plan of Care (Signed)
  Problem: Education: Goal: Ability to describe self-care measures that may prevent or decrease complications (Diabetes Survival Skills Education) will improve Outcome: Progressing   Problem: Skin Integrity: Goal: Risk for impaired skin integrity will decrease Outcome: Progressing   Problem: Clinical Measurements: Goal: Respiratory complications will improve Outcome: Progressing   Problem: Activity: Goal: Risk for activity intolerance will decrease Outcome: Progressing   Problem: Nutrition: Goal: Adequate nutrition will be maintained Outcome: Progressing

## 2024-04-12 NOTE — Progress Notes (Signed)
 PROGRESS NOTE Luis [name]  FMW:[id number] DOB: May 07, 1941 DOA: 04/07/2024 PCP: [name], MD  Brief Narrative/Hospital Course: Luis [name] is a 83 y.o. male with PMH of  metastatic colon cancer, mets to lungs, liver, LN's, sarcoidosis, COPD, CAD hx PCI, HFpEF, hypertension, hyperlipidemia, hypothyroidism, who presented with progressive shortness of breath onset x 1 wk  with  exertional dyspnea, chest congestion, minimally productive cough with clear and scant blood-tinged sputum, seen by pulmonology and was started on Augmentin  on Tuesday but by Thursday his symptoms started worsening and progressively worsened over the weekend.  He ended up checking his pulse oximeter at home which was reading in the high 60s so he presented to the ED  In the ED tachypneic, hypoxic,labs-mag low 1.5 hyperglycemic 291 lactic acidosis 3.3 Pro-Cal 1.5 leukocytosis 14.5, A1c 7.1, high sensitive troponin 29 proBNP 249 EKG sinus rhythm LAD LVH prolonged Qtc, situational up to 75% room air given 4 L nasal cannula Chest x-ray>> right lung base increased patchy airspace opacities and new left infrahilar airspace opacities concerning for multifocal pneumonia small right pleural effusion CT chest>> Postsurgical and radiation changes in the right lower lobe. Multifocal ground glass opacity with mosaic attenuation in the left lung, favoring mild multifocal infection/pneumonia, although pneumonitis related to immunotherapy is also possible.  Stable underlying sequela of sarcoidosis. Patient admitted for acute hypoxic respiratory failure with concern for multifocal pneumonia in the setting of metastatic colon cancer Home Eliquis continued, Lasix  trial given along with increased dose of prednisone  without significant improvement 10/3o Pulm consulted as patient dyspnea worsened on 10/30 10/31 s/p bronchoscopy,BAL sent for studies.culture  Subjective: Seen and examined Reports he feels better today compared to  yesterday washed himself and somewhat short of breath but quickly recovered Overnight afebrile, tolerated nasal cannula 3L, BP stable Labs stable renal function CBG 169 Urine output  UP 2100 CC Wt on admission 219>212>215lb  Assessment and plan:  Acute respiratory failure with hypoxia  New left lung patchy GGO concerning for acute inflammation or infection-multifocal pneumonia versus pneumonitis Immunocompromise status in the setting of chemotherapy History of sarcoidosis on long-term steroid: CT chest:Multifocal ground glass opacity with mosaic attenuation in the left lung, favoring mild multifocal infection/pneumonia, although pneumonitis related to immunotherapy is also possible Patient continued on IV ceftriaxone  azithromycin  prednisone  40 mg Blood culture from admission NGTD. Reports being on steroid for last 2 weeks 20 mg of prednisone  here needing 4 L [place] ON Eliquis PTA-less likelihood of PE. Echo showed normal EF.  70 to 75% G1 DD RV SF is normal RV size is normal Patient continued on IV antibiotics ceftriaxone /Zithromycin along with steroid. Does have edematous legs and started on IV diuresis 10/29 from p.o. bnp was up 237-having good urine output Initially felt some better again had worsening dyspnea 10/30 still needing 4 L nasal cannula, PCCM consulted s/p bronchoscopy BAL 10/31 -follow-up BAL culture data/cytology continue current plan of care PCCM input appreciated-keeping on current plan for now  Acute on chronic HFpEF Chronic leg edema: Oral Lasix  has been changed to IV.  weight a week ago was 216 pound He has been intentionally losing weight before that he was 240.Weight at 219 pound on admission now 212>215. Cont to monitor daily I/O,weight, electrolytes and net balance as below.Keep on  salt/fluid restricted diet and monitor in tele. Net IO Since Admission: -2,250 mL [04/12/24 1037]  Filed Weights   04/10/24 0500 04/11/24 0622 04/11/24 1221  Weight: 96.3 kg 97.7 kg  97.7 kg  Recent Labs  Lab 04/07/24 0036 04/07/24 0442 04/08/24 0233 04/09/24 0510 04/09/24 0816 04/10/24 0419 04/11/24 0407 04/12/24 0840  BNP 249.3*  --   --   --  237.9*  --   --   --   BUN 14 13 19 18   --  19 25* 28*  CREATININE 0.89 0.79 0.79 0.79  --  0.87 0.94 0.99  K 5.5* 4.2 4.1 4.1  --  3.5 3.2* 3.7  MG  --  1.5* 1.8  --   --   --   --   --    Elevated troponin: suspect demand ischemia with flat troponin. TTE is unremarkable  Hypokalemia Hypomagnesemia: Resolved  Lactic acidosis: Likely in the setting of respiratory failure-volume depletion hyperglycemia  New onset type 2 diabetes with uncontrolled hyperglycemia: A1c at 7.1.  Blood sugar up to 400-diet changed to carb controlled diet,started on glipizide and metformin ssi Blood sugar fairly controlled.  Suspect in the setting of his steroid use as well Recent Labs  Lab 04/07/24 0442 04/07/24 0800 04/11/24 0802 04/11/24 1140 04/11/24 1625 04/11/24 2053 04/12/24 0741  GLUCAP  --    < > 114* 168* 270* 175* 117*  HGBA1C 7.1*  --   --   --   --   --   --    < > = values in this interval not displayed.    Metastatic colon cancer, R hemicolectomy, mets to lungs, liver, LN's: Followed by [facility], has known RUL nodule and opacities in the R base ? Lymphangitic spread v. Inflammation/infection (last in 6/'25). He is currently on maintenance 5FU and Panitumumab  every 2 weeks.  Sarcoidosis: Follows with pulmonology, appears that he is currently on chronic steroids   pHTN: Suggested on imaging, noted   HTN HLD CAD hx PCI: Bp stable, no chest pain. Cont his DOAC, rosuvastatin , zetia ,  Ranolazine    Aflutter: Rate controlled. Cont home Apixaban, Dronedarone    Aortic ectasia: OP surveillance   Hypothyroidism: Continue home levothyroxine    Chronic anemia:  Stable at baseline.B12 and folate normal.  Thrombocytopenia: Monitor platelet count, question if from chemo  ?Chronic pain: Continue home  Gabapentin    ?rosacea: continue home Minocycline.   Class  I Obesity w/ Body mass index is 30.04 kg/m.: will benefit with PCP follow-up, weight loss,healthy lifestyle and outpatient sleep eval if not done.  Mobility: PT OT requested PT Orders: Active  PT Follow up Rec: Outpatient Pt (Pulmonary Rehab)04/11/2024 1600    DVT prophylaxis:  Code Status:   Code Status: Prior Family Communication: plan of care discussed with patient at bedside. Patient status is: Remains hospitalized because of severity of illness Level of care: Telemetry Medical   Dispo: The patient is from: home            Anticipated disposition: TBD Objective: Vitals last 24 hrs: Vitals:   04/11/24 2108 04/12/24 0532 04/12/24 0752 04/12/24 1015  BP:  (!) 154/83  (!) 146/65  Pulse:  80 76 96  Resp:   (!) 21 18  Temp:  97.9 F (36.6 C)  97.8 F (36.6 C)  TempSrc:  Oral  Oral  SpO2: 91% 93% 96% 93%  Weight:      Height:       Physical Examination: General exam:AAOX3, pleasant HEENT:Oral mucosa moist, Ear/Nose WNL grossly Respiratory system: b/l diminished at bases.  Clear upper airways,no use of accessory muscle on activity Cardiovascular system: S1 & S2 +, No JVD. Gastrointestinal system: Abdomen soft,NT,ND, BS+ Nervous System: Alert, awake,  moving all extremities,and following commands. Extremities: extremities warm, leg edema  ++ Skin: Warm, no rashes MSK: Normal muscle bulk,tone, power   Medications reviewed:  Scheduled Meds:  apixaban  5 mg Oral BID   budesonide -glycopyrrolate-formoterol  2 puff Inhalation BID   dronedarone   400 mg Oral BID WC   ezetimibe   10 mg Oral Daily   furosemide   40 mg Intravenous BID   gabapentin   200 mg Oral QHS   glipiZIDE  2.5 mg Oral QAC breakfast   guaiFENesin   600 mg Oral BID   insulin aspart  0-15 Units Subcutaneous TID WC   levothyroxine   50 mcg Oral Q0600   metFORMIN  500 mg Oral BID WC   minocycline  100 mg Oral Daily   montelukast   10 mg Oral QHS    predniSONE   40 mg Oral Q breakfast   ranolazine   500 mg Oral BID   rosuvastatin   20 mg Oral Daily   Continuous Infusions:   Diet: Diet Order             Diet Carb Modified Fluid consistency: Thin; Room service appropriate? Yes  Diet effective now                    Data Reviewed: I have personally reviewed following labs and imaging studies ( see epic result tab) CBC: Recent Labs  Lab 04/08/24 0233 04/09/24 0816 04/10/24 0419 04/11/24 0407 04/12/24 0626  WBC 14.5* 15.1* 12.9* 11.7* 12.1*  HGB 11.1* 12.1* 12.2* 12.1* 12.5*  HCT 34.6* 37.8* 38.4* 37.9* 39.4  MCV 99.1 98.7 98.0 96.7 97.8  PLT 108* 149* 131* 127* 161   CMP: Recent Labs  Lab 04/07/24 0442 04/08/24 0233 04/09/24 0510 04/10/24 0419 04/11/24 0407 04/12/24 0840  NA 133* 133* 134* 137 134* 135  K 4.2 4.1 4.1 3.5 3.2* 3.7  CL 99 98 101 96* 96* 94*  CO2 20* 23 18* 27 25 26   GLUCOSE 291* 113* 148* 99 127* 169*  BUN 13 19 18 19  25* 28*  CREATININE 0.79 0.79 0.79 0.87 0.94 0.99  CALCIUM  8.1* 8.4* 8.3* 8.3* 8.0* 8.2*  MG 1.5* 1.8  --   --   --   --   PHOS 2.7  --   --   --   --   --    GFR: Estimated Creatinine Clearance: 67.4 mL/min (by C-G formula based on SCr of 0.99 mg/dL). No results for input(s): AST, ALT, ALKPHOS, BILITOT, PROT, ALBUMIN in the last 168 hours. No results for input(s): LIPASE, AMYLASE in the last 168 hours. No results for input(s): AMMONIA in the last 168 hours. Coagulation Profile:  Recent Labs  Lab 04/07/24 0036  INR 1.3*   Unresulted Labs (From admission, onward)     Start     Ordered   04/11/24 1312  KOH prep  RELEASE UPON ORDERING,   TIMED       Comments: Specimen A: Specimen Description LUL BAL Phone [phone] Immunocompromised?  No  Antibiotic Treatment:  none Is the patient on airborne/droplet precautions? No Clinical History:  N/A Special Instructions:  none Specimen Disposition:  Microbiology     04/11/24 1312   04/11/24 1312  Pneumocystis  smear by DFA  RELEASE UPON ORDERING,   TIMED       Comments: Specimen A: Specimen Description LUL BAL Phone [phone] Immunocompromised?  No  Antibiotic Treatment:  none Is the patient on airborne/droplet precautions? No Clinical History:  N/A Special Instructions:  none Specimen Disposition:  Microbiology     04/11/24 1312   04/11/24 1312  Fungus Culture With Stain  RELEASE UPON ORDERING,   TIMED       Comments: Specimen A: Specimen Description LUL BAL Phone [phone] Immunocompromised?  No  Antibiotic Treatment:  none Is the patient on airborne/droplet precautions? No Clinical History:  N/A Special Instructions:  none Specimen Disposition:  Microbiology     04/11/24 1312   04/11/24 1312  Acid Fast Culture with reflexed sensitivities  RELEASE UPON ORDERING,   TIMED       Comments: Specimen A: Specimen Description LUL BAL Phone [phone] Immunocompromised?  No  Antibiotic Treatment:  none Is the patient on airborne/droplet precautions? No Clinical History:  N/A Special Instructions:  none Specimen Disposition:  Microbiology     04/11/24 1312   04/11/24 1312  Acid Fast Smear (AFB)  RELEASE UPON ORDERING,   TIMED       Comments: Specimen A: Specimen Description LUL BAL Phone [phone] Immunocompromised?  No  Antibiotic Treatment:  none Is the patient on airborne/droplet precautions? No Clinical History:  N/A Special Instructions:  none Specimen Disposition:  Microbiology     04/11/24 1312           Antimicrobials/Microbiology: Anti-infectives (From admission, onward)    Start     Dose/Rate Route Frequency Ordered Stop   04/08/24 1000  azithromycin  (ZITHROMAX ) tablet 500 mg        500 mg Oral Daily 04/07/24 0238 04/09/24 0852   04/08/24 0000  cefTRIAXone  (ROCEPHIN ) 1 g in sodium chloride  0.9 % 100 mL IVPB        1 g 200 mL/hr over 30 Minutes Intravenous Every 24 hours 04/07/24 0238 04/11/24 0815   04/07/24 1000  minocycline (MINOCIN) capsule 100 mg         100 mg Oral Daily 04/07/24 0241     04/07/24 0130  cefTRIAXone  (ROCEPHIN ) 2 g in sodium chloride  0.9 % 100 mL IVPB        2 g 200 mL/hr over 30 Minutes Intravenous  Once 04/07/24 0124 04/07/24 0255   04/07/24 0130  azithromycin  (ZITHROMAX ) 500 mg in sodium chloride  0.9 % 250 mL IVPB        500 mg 250 mL/hr over 60 Minutes Intravenous  Once 04/07/24 0124 04/07/24 0337         Component Value Date/Time   SDES BRONCHIAL ALVEOLAR LAVAGE 04/11/2024 1307   SPECREQUEST LUL 04/11/2024 1307   CULT PENDING 04/11/2024 1307   REPTSTATUS PENDING 04/11/2024 1307    Procedures: Procedure(s) (LRB): VIDEO BRONCHOSCOPY WITHOUT FLUORO (N/A) Mennie LAMY, MD [facility] 04/12/2024, 10:37 AM

## 2024-04-12 NOTE — Progress Notes (Signed)
 Mobility Specialist Progress Note;   04/12/24 1213  Mobility  Activity Ambulated with assistance  Level of Assistance Standby assist, set-up cues, supervision of patient - no hands on  Assistive Device Front wheel walker  Distance Ambulated (ft) 150 ft  Activity Response Tolerated well  Mobility Referral Yes  Mobility visit 1 Mobility  Mobility Specialist Start Time (ACUTE ONLY) 1213  Mobility Specialist Stop Time (ACUTE ONLY) 1230  Mobility Specialist Time Calculation (min) (ACUTE ONLY) 17 min   Pt in chair upon arrival, eager for mobility. Required no physical assistance during ambulation, SV for safety. Ambulated on 6LO2 to maintain SPO2. Desat to 85% on 6L, requiring a prolonged seated rest break to recover within ~3-4 minutes. PLB encouraged throughout. Able to ambulate back to room w/o fault. Pt left in chair with all needs met. Wife present.   Lauraine Erm Mobility Specialist Please contact via SecureChat or [employer] [phone]

## 2024-04-12 DEATH — deceased

## 2024-04-13 ENCOUNTER — Encounter (HOSPITAL_COMMUNITY): Payer: Self-pay | Admitting: Critical Care Medicine

## 2024-04-13 DIAGNOSIS — J9601 Acute respiratory failure with hypoxia: Secondary | ICD-10-CM | POA: Diagnosis not present

## 2024-04-13 LAB — CULTURE, BAL-QUANTITATIVE W GRAM STAIN: Culture: NO GROWTH

## 2024-04-13 LAB — BASIC METABOLIC PANEL WITH GFR
Anion gap: 14 (ref 5–15)
BUN: 27 mg/dL — ABNORMAL HIGH (ref 8–23)
CO2: 27 mmol/L (ref 22–32)
Calcium: 8.2 mg/dL — ABNORMAL LOW (ref 8.9–10.3)
Chloride: 93 mmol/L — ABNORMAL LOW (ref 98–111)
Creatinine, Ser: 0.97 mg/dL (ref 0.61–1.24)
GFR, Estimated: >60 mL/min (ref >60)
Glucose, Bld: 114 mg/dL — ABNORMAL HIGH (ref 70–99)
Potassium: 3.8 mmol/L (ref 3.5–5.1)
Sodium: 134 mmol/L — ABNORMAL LOW (ref 135–145)

## 2024-04-13 LAB — GLUCOSE, CAPILLARY
Glucose-Capillary: 110 mg/dL — ABNORMAL HIGH (ref 70–99)
Glucose-Capillary: 176 mg/dL — ABNORMAL HIGH (ref 70–99)
Glucose-Capillary: 259 mg/dL — ABNORMAL HIGH (ref 70–99)
Glucose-Capillary: 175 mg/dL — ABNORMAL HIGH (ref 70–99)

## 2024-04-13 LAB — ACID FAST SMEAR (AFB, MYCOBACTERIA): Acid Fast Smear: NEGATIVE

## 2024-04-13 MED ORDER — DOXYCYCLINE HYCLATE 100 MG PO TABS
100.0000 mg | ORAL_TABLET | Freq: Two times a day (BID) | ORAL | Status: AC
  Administered 2024-04-13 – 2024-04-20 (×15): 100 mg via ORAL
  Filled 2024-04-13 (×15): qty 1

## 2024-04-13 NOTE — Progress Notes (Signed)
 Mobility Specialist Progress Note;   04/13/24 1210  Mobility  Activity Ambulated with assistance  Level of Assistance Standby assist, set-up cues, supervision of patient - no hands on  Assistive Device Other (Comment) (IV pole)  Distance Ambulated (ft) 200 ft  Activity Response Tolerated well  Mobility Referral Yes  Mobility visit 1 Mobility  Mobility Specialist Start Time (ACUTE ONLY) 1210  Mobility Specialist Stop Time (ACUTE ONLY) 1252  Mobility Specialist Time Calculation (min) (ACUTE ONLY) 42 min   Pt pleasant and agreeable to mobility. On 4LO2 upon arrival. Still required 6LO2 during ambulation to maintain SPO2, however pt displaying and sounding much better this session. Took 3x brief seated rest breaks to recover when needed. No c/o when asked. Pt returned back to chair and left with all needs met. Wife present.   Lauraine Erm Mobility Specialist Please contact via SecureChat or [employer] [phone]

## 2024-04-13 NOTE — Progress Notes (Signed)
 Occupational Therapy Treatment Patient Details Name: Luis [name] MRN: [id number] DOB: [birth year] Today's Date: 04/13/2024   History of present illness 83 y.o. male presents to [facility] on 04/07/2024 with progressive SOB. Pt underwent bronchoscopy with biopsies 10/31. PMH includes metastatic colon CA with mets to lungs, liver, and lymph nodes, sarcoidosis, COPD, CAD, HFpEF, HTN, HLD, hypothyroidism.   OT comments  Pt. Seen for skilled OT treatment session with spouse present.  Tx session focused on education and est. Energy conservation strategies.  Provided and reviewed 5Ps.  Pt. With good return demo of PLB.  Pt. And spouse receptive to all education and recommendations.  Pt. Able to state some strategies already in place.  Reviewed A/E options if needed for LB ADLs. Pt. Currently able to achieve figure 4 for LB dressing and reports having A/E device to don compression socks.  Cont. With acute OT POC.       If plan is discharge home, recommend the following:  A little help with walking and/or transfers;Direct supervision/assist for medications management;Assist for transportation;Help with stairs or ramp for entrance;Assistance with cooking/housework;A little help with bathing/dressing/bathroom   Equipment Recommendations  BSC/3in1;Tub/shower seat    Recommendations for Other Services      Precautions / Restrictions Precautions Precautions: Fall Recall of Precautions/Restrictions: Intact Precaution/Restrictions Comments: Monitor SpO2; unable to get a consistently accurate reading despite multiple methods including portable pulse ox and dynamap       Mobility Bed Mobility                    Transfers                         Balance                                           ADL either performed or assessed with clinical judgement   ADL Overall ADL's : Needs assistance/impaired                     Lower Body Dressing:  Sitting/lateral leans;Set up Lower Body Dressing Details (indicate cue type and reason): pt. able to figure 4 to reach BLEs for LB dressing.  pt. reports he has a device that helps him don compression socks.  reviewed other a/e options pt. reports he does not need them at this time               General ADL Comments: provided 5Ps hand out and reviewed each item for education on energy conservation strategies.  pt. and spouse receptive but pt. also admits difficulty slowing pace and also using DME as he sees it as a sign of weakness.  provide emotional support with his feelings but also reviewed it allows him to be present and complete the tasks he wants to. he agreed and say she does use dme for yardwork and did use it to go to a grandsons sporting event recently    Extremity/Trunk Assessment              Vision       Perception     Praxis     Communication Communication Communication: No apparent difficulties   Cognition Arousal: Alert Behavior During Therapy: WFL for tasks assessed/performed Cognition: No apparent impairments  Following commands: Intact        Cueing   Cueing Techniques: Verbal cues  Exercises      Shoulder Instructions       General Comments      Pertinent Vitals/ Pain       Pain Assessment Pain Assessment: No/denies pain  Home Living                                          Prior Functioning/Environment              Frequency  Min 2X/week        Progress Toward Goals  OT Goals(current goals can now be found in the care plan section)  Progress towards OT goals: Progressing toward goals     Plan      Co-evaluation                 AM-PAC OT 6 Clicks Daily Activity     Outcome Measure   Help from another person eating meals?: None Help from another person taking care of personal grooming?: A Little Help from another person toileting, which  includes using toliet, bedpan, or urinal?: A Little Help from another person bathing (including washing, rinsing, drying)?: A Lot Help from another person to put on and taking off regular upper body clothing?: A Little Help from another person to put on and taking off regular lower body clothing?: A Lot 6 Click Score: 17    End of Session    OT Visit Diagnosis: Muscle weakness (generalized) (M62.81)   Activity Tolerance Patient tolerated treatment well   Patient Left in chair;with call bell/phone within reach;with family/visitor present   Nurse Communication Other (comment) (rn states ok to work with pt. and reviewed pt. on lower liters of o2 now (3.5) with nasal canula)        Time: 8872-8850 OT Time Calculation (min): 22 min  Charges: OT General Charges $OT Visit: 1 Visit OT Treatments $Self Care/Home Management : 8-22 mins  Randall, COTA/L Acute Rehabilitation [phone]   Luis [name]  04/13/2024, 1:24 PM

## 2024-04-13 NOTE — Progress Notes (Signed)
 PROGRESS NOTE Luis [name]  FMW:[id number] DOB: 24-Sep-1940 DOA: 04/07/2024 PCP: [name], MD  Brief Narrative/Hospital Course: Luis [name] is a 83 y.o. male with PMH of  metastatic colon cancer, mets to lungs, liver, LN's, sarcoidosis, COPD, CAD hx PCI, HFpEF, hypertension, hyperlipidemia, hypothyroidism, who presented with progressive shortness of breath onset x 1 wk  with  exertional dyspnea, chest congestion, minimally productive cough with clear and scant blood-tinged sputum, seen by pulmonology and was started on Augmentin  on Tuesday but by Thursday his symptoms started worsening and progressively worsened over the weekend.  He ended up checking his pulse oximeter at home which was reading in the high 60s so he presented to the ED  In the ED tachypneic, hypoxic,labs-mag low 1.5 hyperglycemic 291 lactic acidosis 3.3 Pro-Cal 1.5 leukocytosis 14.5, A1c 7.1, high sensitive troponin 29 proBNP 249 EKG sinus rhythm LAD LVH prolonged Qtc, situational up to 75% room air given 4 L nasal cannula Chest x-ray>> right lung base increased patchy airspace opacities and new left infrahilar airspace opacities concerning for multifocal pneumonia small right pleural effusion CT chest>> Postsurgical and radiation changes in the right lower lobe. Multifocal ground glass opacity with mosaic attenuation in the left lung, favoring mild multifocal infection/pneumonia, although pneumonitis related to immunotherapy is also possible.  Stable underlying sequela of sarcoidosis. Patient admitted for acute hypoxic respiratory failure with concern for multifocal pneumonia in the setting of metastatic colon cancer Home Eliquis continued, Lasix  trial given along with increased dose of prednisone  without significant improvement 10/3o Pulm consulted as patient dyspnea worsened on 10/30 10/31 s/p bronchoscopy,BAL sent for studies.culture  Subjective: Seen and examined Resting comfortably in the bedside chair, his wife  is at the bedside Reports he feels overall better today. Reports  cough is loosening. Overnight remains afebrile BP stable on 3 L nasal cannula Labs with stable electrolytes Wt on admission 219>212>215lb same  Assessment and plan:  Acute respiratory failure with hypoxia  New left lung patchy GGO concerning for acute inflammation or infection-multifocal pneumonia versus pneumonitis Immunocompromise status in the setting of chemotherapy History of sarcoidosis on long-term steroid: CT chest:Multifocal ground glass opacity with mosaic attenuation in the left lung, favoring mild multifocal infection/pneumonia, although pneumonitis related to immunotherapy is also possible Reports being on steroid for last 2 weeks 20 mg of prednisone  here needing 4 L [place] Eliquis PTA-less likelihood of PE. Echo showed normal EF.  70 to 75% G1 DD RV SF is normal RV size is normal Blood culture from admission NGTD. Patient continued on IV antibiotics ceftriaxone /Zithromycin along with steroid. Does have edematous legs and started on IV diuresis 10/29 from p.o. bnp was up 237-having good urine output Initially felt some better again had worsening dyspnea 10/30 still needing 4 L nasal cannula, PCCM consulted s/p bronchoscopy BAL 10/31 -follow-up BAL culture data/cytology -- routine, fungal, AFB, and PJP DFA pending  Plans to continue IV antibiotics now on ceftriaxone  and doxycycline  Suspecting bacterial/atypical infection  Acute on chronic HFpEF Chronic leg edema: Oral Lasix  has been changed to IV.  weight a week ago was 216 pound He has been intentionally losing weight before that he was 240.Weight at 219 pound on admission now 212>215 remains the same.Cont to monitor daily I/O,weight, electrolytes and net balance as below.Keep on  salt/fluid restricted diet and monitor in tele. Net IO Since Admission: -3,360 mL [04/13/24 1153]  Filed Weights   04/10/24 0500 04/11/24 0622 04/11/24 1221  Weight: 96.3 kg 97.7 kg  97.7 kg  Recent Labs  Lab 04/07/24 0036 04/07/24 0442 04/08/24 0233 04/09/24 0510 04/09/24 0816 04/10/24 0419 04/11/24 0407 04/12/24 0840 04/13/24 0647  BNP 249.3*  --   --   --  237.9*  --   --   --   --   BUN 14 13 19 18   --  19 25* 28* 27*  CREATININE 0.89 0.79 0.79 0.79  --  0.87 0.94 0.99 0.97  K 5.5* 4.2 4.1 4.1  --  3.5 3.2* 3.7 3.8  MG  --  1.5* 1.8  --   --   --   --   --   --    Elevated troponin: suspect demand ischemia with flat troponin. TTE is unremarkable  Hypokalemia Hypomagnesemia: Resolved  Lactic acidosis: Likely in the setting of respiratory failure-volume depletion hyperglycemia.   New onset type 2 diabetes with uncontrolled hyperglycemia: A1c at 7.1.  Blood sugar up to 400-diet changed to carb controlled diet,started on glipizide and metformin ssi Blood sugar fairly controlled.  Suspect in the setting of his steroid use as well Recent Labs  Lab 04/07/24 0442 04/07/24 0800 04/12/24 0741 04/12/24 1240 04/12/24 1701 04/12/24 2035 04/13/24 0841  GLUCAP  --    < > 117* 219* 162* 216* 110*  HGBA1C 7.1*  --   --   --   --   --   --    < > = values in this interval not displayed.    Metastatic colon cancer, R hemicolectomy, mets to lungs, liver, LN's: Followed by [facility], has known RUL nodule and opacities in the R base ? Lymphangitic spread v. Inflammation/infection (last in 6/'25).He is currently on maintenance 5FU and Panitumumab  every 2 weeks.  Sarcoidosis: Follows with pulmonology, appears that he is currently on chronic steroids   pHTN: Suggested on imaging, noted   HTN HLD CAD hx PCI: Bp stable, no chest pain. Cont his DOAC, rosuvastatin , zetia ,  Ranolazine    Aflutter: Rate controlled. Cont home Apixaban, Dronedarone    Aortic ectasia: OP surveillance   Hypothyroidism: Continue home levothyroxine    Chronic anemia:  Stable at baseline.B12 and folate normal.  Thrombocytopenia: Monitor platelet count, question if from  chemo  ?Chronic pain: Continue home Gabapentin    ?Rosacea: continue home Minocycline.   Class  I Obesity w/ Body mass index is 30.04 kg/m.: will benefit with PCP follow-up, weight loss,healthy lifestyle and outpatient sleep eval if not done.  PT Orders: Active PT Follow up Rec: Outpatient Pt (Pulmonary Rehab)04/11/2024 1600    DVT prophylaxis: Eliquis Code Status:   Code Status: Prior Family Communication: plan of care discussed with patient at bedside. Patient status is: Remains hospitalized because of severity of illness Level of care: Telemetry Medical   Dispo: The patient is from: home.            Anticipated disposition: TBD. Objective: Vitals last 24 hrs: Vitals:   04/12/24 2034 04/12/24 2106 04/13/24 0440 04/13/24 1009  BP: (!) 144/67  129/69 123/80  Pulse: 77 90 78 81  Resp:  16 16 17   Temp: 97.6 F (36.4 C)  97.6 F (36.4 C) 97.8 F (36.6 C)  TempSrc: Oral  Oral Oral  SpO2: 93%  90% 90%  Weight:      Height:       Physical Examination: General exam:AAOX3, pleasant HEENT:Oral mucosa moist, Ear/Nose WNL grossly Respiratory system:  CTA upper lungs and diminished at base-overall mostly clear, no use of accessory muscle Cardiovascular system: S1 & S2 +, No JVD. Gastrointestinal  system: Abdomen soft,NT,ND, BS+ Nervous System: Alert, awake, moving all extremities,and following commands. Extremities: extremities warm, leg edema  ++ Skin: Warm, no rashes MSK: Normal muscle bulk,tone, power.   Medications reviewed:  Scheduled Meds:  apixaban  5 mg Oral BID   budesonide -glycopyrrolate-formoterol  2 puff Inhalation BID   dronedarone   400 mg Oral BID WC   ezetimibe   10 mg Oral Daily   furosemide   40 mg Intravenous BID   gabapentin   200 mg Oral QHS   glipiZIDE  2.5 mg Oral QAC breakfast   guaiFENesin   600 mg Oral BID   insulin aspart  0-15 Units Subcutaneous TID WC   levothyroxine   50 mcg Oral Q0600   metFORMIN  500 mg Oral BID WC   minocycline  100 mg Oral  Daily   montelukast   10 mg Oral QHS   predniSONE   40 mg Oral Q breakfast   ranolazine   500 mg Oral BID   rosuvastatin   20 mg Oral Daily   Continuous Infusions:  cefTRIAXone  (ROCEPHIN )  IV 2 g (04/12/24 1748)   doxycycline  (VIBRAMYCIN ) IV 100 mg (04/13/24 0723)    Diet: Diet Order             Diet Carb Modified Fluid consistency: Thin; Room service appropriate? Yes  Diet effective now                    Data Reviewed: I have personally reviewed following labs and imaging studies ( see epic result tab) CBC: Recent Labs  Lab 04/09/24 0816 04/10/24 0419 04/11/24 0407 04/12/24 0626 04/12/24 1056  WBC 15.1* 12.9* 11.7* 12.1* 14.5*  HGB 12.1* 12.2* 12.1* 12.5* 12.1*  HCT 37.8* 38.4* 37.9* 39.4 38.6*  MCV 98.7 98.0 96.7 97.8 97.0  PLT 149* 131* 127* 161 162   CMP: Recent Labs  Lab 04/07/24 0442 04/08/24 0233 04/09/24 0510 04/10/24 0419 04/11/24 0407 04/12/24 0840 04/13/24 0647  NA 133* 133* 134* 137 134* 135 134*  K 4.2 4.1 4.1 3.5 3.2* 3.7 3.8  CL 99 98 101 96* 96* 94* 93*  CO2 20* 23 18* 27 25 26 27   GLUCOSE 291* 113* 148* 99 127* 169* 114*  BUN 13 19 18 19  25* 28* 27*  CREATININE 0.79 0.79 0.79 0.87 0.94 0.99 0.97  CALCIUM  8.1* 8.4* 8.3* 8.3* 8.0* 8.2* 8.2*  MG 1.5* 1.8  --   --   --   --   --   PHOS 2.7  --   --   --   --   --   --    GFR: Estimated Creatinine Clearance: 68.8 mL/min (by C-G formula based on SCr of 0.97 mg/dL). No results for input(s): AST, ALT, ALKPHOS, BILITOT, PROT, ALBUMIN in the last 168 hours. No results for input(s): LIPASE, AMYLASE in the last 168 hours. No results for input(s): AMMONIA in the last 168 hours. Coagulation Profile:  Recent Labs  Lab 04/07/24 0036  INR 1.3*   Unresulted Labs (From admission, onward)     Start     Ordered   04/13/24 0500  Basic metabolic panel with GFR  Daily,   R      04/12/24 1038   04/12/24 1039  CBC  Every 48 hours,   R      04/12/24 1038   04/11/24 1312  KOH prep  RELEASE  UPON ORDERING,   TIMED       Comments: Specimen A: Specimen Description LUL BAL Phone [phone] Immunocompromised?  No  Antibiotic Treatment:  none Is the patient on airborne/droplet precautions? No Clinical History:  N/A Special Instructions:  none Specimen Disposition:  Microbiology     04/11/24 1312   04/11/24 1312  Pneumocystis smear by DFA  RELEASE UPON ORDERING,   TIMED       Comments: Specimen A: Specimen Description LUL BAL Phone [phone] Immunocompromised?  No  Antibiotic Treatment:  none Is the patient on airborne/droplet precautions? No Clinical History:  N/A Special Instructions:  none Specimen Disposition:  Microbiology     04/11/24 1312   04/11/24 1312  Fungus Culture With Stain  RELEASE UPON ORDERING,   TIMED       Comments: Specimen A: Specimen Description LUL BAL Phone [phone] Immunocompromised?  No  Antibiotic Treatment:  none Is the patient on airborne/droplet precautions? No Clinical History:  N/A Special Instructions:  none Specimen Disposition:  Microbiology     04/11/24 1312   04/11/24 1312  Acid Fast Culture with reflexed sensitivities  RELEASE UPON ORDERING,   TIMED       Comments: Specimen A: Specimen Description LUL BAL Phone [phone] Immunocompromised?  No  Antibiotic Treatment:  none Is the patient on airborne/droplet precautions? No Clinical History:  N/A Special Instructions:  none Specimen Disposition:  Microbiology     04/11/24 1312   04/11/24 1312  Acid Fast Smear (AFB)  RELEASE UPON ORDERING,   TIMED       Comments: Specimen A: Specimen Description LUL BAL Phone [phone] Immunocompromised?  No  Antibiotic Treatment:  none Is the patient on airborne/droplet precautions? No Clinical History:  N/A Special Instructions:  none Specimen Disposition:  Microbiology     04/11/24 1312           Antimicrobials/Microbiology: Anti-infectives (From admission, onward)    Start     Dose/Rate Route Frequency Ordered Stop    04/12/24 1800  cefTRIAXone  (ROCEPHIN ) 2 g in sodium chloride  0.9 % 100 mL IVPB        2 g 200 mL/hr over 30 Minutes Intravenous Every 24 hours 04/12/24 1655     04/12/24 1800  doxycycline  (VIBRAMYCIN ) 100 mg in sodium chloride  0.9 % 250 mL IVPB        100 mg 125 mL/hr over 120 Minutes Intravenous Every 12 hours 04/12/24 1655     04/08/24 1000  azithromycin  (ZITHROMAX ) tablet 500 mg        500 mg Oral Daily 04/07/24 0238 04/09/24 0852   04/08/24 0000  cefTRIAXone  (ROCEPHIN ) 1 g in sodium chloride  0.9 % 100 mL IVPB        1 g 200 mL/hr over 30 Minutes Intravenous Every 24 hours 04/07/24 0238 04/11/24 0815   04/07/24 1000  minocycline (MINOCIN) capsule 100 mg        100 mg Oral Daily 04/07/24 0241     04/07/24 0130  cefTRIAXone  (ROCEPHIN ) 2 g in sodium chloride  0.9 % 100 mL IVPB        2 g 200 mL/hr over 30 Minutes Intravenous  Once 04/07/24 0124 04/07/24 0255   04/07/24 0130  azithromycin  (ZITHROMAX ) 500 mg in sodium chloride  0.9 % 250 mL IVPB        500 mg 250 mL/hr over 60 Minutes Intravenous  Once 04/07/24 0124 04/07/24 0337         Component Value Date/Time   SDES BRONCHIAL ALVEOLAR LAVAGE 04/11/2024 1307   SPECREQUEST LUL 04/11/2024 1307   CULT  04/11/2024 1307    NO GROWTH Performed at [facility]  Lab, [address] [place], [name] [address]    REPTSTATUS 04/13/2024 FINAL 04/11/2024 1307    Procedures: Procedure(s) (LRB): VIDEO BRONCHOSCOPY WITHOUT FLUORO (N/A) Mennie LAMY, MD [facility] 04/13/2024, 11:54 AM

## 2024-04-13 NOTE — Plan of Care (Signed)
  Problem: Education: Goal: Ability to describe self-care measures that may prevent or decrease complications (Diabetes Survival Skills Education) will improve Outcome: Progressing   Problem: Coping: Goal: Ability to adjust to condition or change in health will improve Outcome: Progressing   Problem: Metabolic: Goal: Ability to maintain appropriate glucose levels will improve Outcome: Progressing   Problem: Nutritional: Goal: Maintenance of adequate nutrition will improve Outcome: Progressing

## 2024-04-14 DIAGNOSIS — J189 Pneumonia, unspecified organism: Secondary | ICD-10-CM

## 2024-04-14 DIAGNOSIS — M7989 Other specified soft tissue disorders: Secondary | ICD-10-CM

## 2024-04-14 DIAGNOSIS — J9601 Acute respiratory failure with hypoxia: Secondary | ICD-10-CM | POA: Diagnosis not present

## 2024-04-14 LAB — BASIC METABOLIC PANEL WITH GFR
Anion gap: 11 (ref 5–15)
BUN: 27 mg/dL — ABNORMAL HIGH (ref 8–23)
CO2: 30 mmol/L (ref 22–32)
Calcium: 8.4 mg/dL — ABNORMAL LOW (ref 8.9–10.3)
Chloride: 95 mmol/L — ABNORMAL LOW (ref 98–111)
Creatinine, Ser: 1.01 mg/dL (ref 0.61–1.24)
GFR, Estimated: >60 mL/min (ref >60)
Glucose, Bld: 104 mg/dL — ABNORMAL HIGH (ref 70–99)
Potassium: 3.9 mmol/L (ref 3.5–5.1)
Sodium: 136 mmol/L (ref 135–145)

## 2024-04-14 LAB — GLUCOSE, CAPILLARY
Glucose-Capillary: 127 mg/dL — ABNORMAL HIGH (ref 70–99)
Glucose-Capillary: 188 mg/dL — ABNORMAL HIGH (ref 70–99)
Glucose-Capillary: 205 mg/dL — ABNORMAL HIGH (ref 70–99)
Glucose-Capillary: 156 mg/dL — ABNORMAL HIGH (ref 70–99)

## 2024-04-14 LAB — CBC
HCT: 39.9 % (ref 39.0–52.0)
Hemoglobin: 12.7 g/dL — ABNORMAL LOW (ref 13.0–17.0)
MCH: 30.5 pg (ref 26.0–34.0)
MCHC: 31.8 g/dL (ref 30.0–36.0)
MCV: 95.7 fL (ref 80.0–100.0)
Platelets: 167 K/uL (ref 150–400)
RBC: 4.17 MIL/uL — ABNORMAL LOW (ref 4.22–5.81)
RDW: 17.1 % — ABNORMAL HIGH (ref 11.5–15.5)
WBC: 15.5 K/uL — ABNORMAL HIGH (ref 4.0–10.5)
nRBC: 0 % (ref 0.0–0.2)

## 2024-04-14 MED ORDER — FUROSEMIDE 10 MG/ML IJ SOLN
80.0000 mg | Freq: Four times a day (QID) | INTRAMUSCULAR | Status: AC
  Administered 2024-04-15 (×2): 80 mg via INTRAVENOUS
  Filled 2024-04-14 (×2): qty 8

## 2024-04-14 NOTE — Progress Notes (Signed)
 NAME:  Luis [name], MRN:  [id number], DOB:  14-Jun-1940, LOS: 7 ADMISSION DATE:  04/07/2024, CONSULTATION DATE:  10/30 REFERRING MD:  [facility], CHIEF COMPLAINT:  acute resp failure   History of Present Illness:  [name] is an 83 y/o gentleman with a history of pulmonary sarcoidosis on chronic steriods, COPD-asthma overlap on triple inhaled therapy & Dupixent , stage IV cecal adenocarcinoma being treated at [facility] since 2019 who presented to the hospital with progressive shortness of breath and dry cough over 4 days on 04/07/2024.  He had been seen in pulmonary clinic today following his symptoms starting on 7/23.  He had an exacerbation of his respiratory disease in March and September of this year.  Since his exacerbation in September he had gone back on prednisone , tapering from 30 mg to 20 mg to 10 mg daily.  When he got back down to 10 mg daily dose he felt his symptoms flared up again, similar to tapers done in the 1990s when he was on chronic steroids for sarcoidosis.  He has only been on intermittent steroids since 1990s; he has not been on steroid sparing DMARD therapy due to quiescent sarcoidosis over the years.  He has not had sick contacts recently.  No new symptoms other than some leg swelling consistent with hospital.  No fevers.  He has had green sputum production in the hospital, but did not have sputum at home.  He is not on home oxygen.  He last had chemotherapy 2 weeks ago-pembrolizumab and 5-fluorouracil .  He felt well after this for a few days before starting to feel run down.  His meds are not to take primary support.  He is on Eliquis for DVT diagnosed in late July 2025 & Afib.  Patient hospital because required 4 to 6 L nasal cannula.  He has been maintained on azithromycin , ceftriaxone , prednisone  40mg  daily. Overall he felt a little better yesterday but is more dyspneic again today.   Pertinent  Medical History  Afib, recent cardioversion Metastatic adenocarcinoma of  cecum Pulmonary sarcoidosis diagnosed by open lung biopsy in 1990s COPD asthma overlap Coronary artery disease HTN HLD  Significant Hospital Events: Including procedures, antibiotic start and stop dates in addition to other pertinent events   10/26 admitted to the hospital, started on ceftriaxone  and azithromycin , prednisone  40 mg daily 10/29 completed azithromycin  10/30 stopped ceftriaxone   Interim History / Subjective:  No acute events overnight.  Weaning oxygen.  Continues to feel improved compared to admission.  Objective    Blood pressure (!) 132/56, pulse 86, temperature (!) 97.5 F (36.4 C), temperature source Oral, resp. rate 18, height 5' 11 (1.803 m), weight 93.2 kg, SpO2 90%.        Intake/Output Summary (Last 24 hours) at 04/14/2024 1717 Last data filed at 04/14/2024 1204 Gross per 24 hour  Intake 460 ml  Output 800 ml  Net -340 ml   Filed Weights   04/11/24 0622 04/11/24 1221 04/14/24 0628  Weight: 97.7 kg 97.7 kg 93.2 kg    Examination: General: Acute on chronically ill, sitting up in recliner HENT: No icterus Lungs: Normal work of breathing on 2 L nasal cannula Cardiovascular: Warm, significant edema of lower extremities noted Neuro: awake, alert, oriented  RVP negative COVID-negative  BAL notable for neutrophil predominance, otherwise no significant results, cultures negative  Resolved problem list   Assessment and Plan   Community-acquired pneumonia: Patchy infiltrates ground glass on the left, chronic on the right.  BAL with greater than  50% neutrophils.  Concerning for suppurative, bacterial infection.  Cultures are negative, likely due to preceding antibiotics - Continue current antibiotic regimen, plan 14 days total, can complete oral antibiotics if ready for discharge prior to completing regimen  Lower extremity swelling: Likely due to fluid administration this hospitalization.  Possibly prolonging hypoxemia. -- Continue IV Lasix , will  increase dose 11/4 given significant edema   Labs   CBC: Recent Labs  Lab 04/10/24 0419 04/11/24 0407 04/12/24 0626 04/12/24 1056 04/14/24 1039  WBC 12.9* 11.7* 12.1* 14.5* 15.5*  HGB 12.2* 12.1* 12.5* 12.1* 12.7*  HCT 38.4* 37.9* 39.4 38.6* 39.9  MCV 98.0 96.7 97.8 97.0 95.7  PLT 131* 127* 161 162 167    Basic Metabolic Panel: Recent Labs  Lab 04/08/24 0233 04/09/24 0510 04/10/24 0419 04/11/24 0407 04/12/24 0840 04/13/24 0647 04/14/24 0409  NA 133*   < > 137 134* 135 134* 136  K 4.1   < > 3.5 3.2* 3.7 3.8 3.9  CL 98   < > 96* 96* 94* 93* 95*  CO2 23   < > 27 25 26 27 30   GLUCOSE 113*   < > 99 127* 169* 114* 104*  BUN 19   < > 19 25* 28* 27* 27*  CREATININE 0.79   < > 0.87 0.94 0.99 0.97 1.01  CALCIUM  8.4*   < > 8.3* 8.0* 8.2* 8.2* 8.4*  MG 1.8  --   --   --   --   --   --    < > = values in this interval not displayed.      Critical care time: n/a      Donnice JONELLE Beals, MD 04/14/24 5:17 PM [facility]  For contact information, see Amion. If no response to pager, please call PCCM consult pager. After hours, 7PM- 7AM, please call Elink.

## 2024-04-14 NOTE — Progress Notes (Signed)
 Occupational Therapy Treatment Patient Details Name: Luis [name] MRN: [id number] DOB: January 09, 1941 Today's Date: 04/14/2024   History of present illness 83 y.o. male presents to [facility] hospital on 04/07/2024 with progressive SOB. Pt underwent bronchoscopy with biopsies 10/31. PMH includes metastatic colon CA with mets to lungs, liver, and lymph nodes, sarcoidosis, COPD, CAD, HFpEF, HTN, HLD, hypothyroidism.   OT comments  Pt progressing towards OT established goals. Goals updated to reflect progress. Focus of session on energy conservation strategies and strategies for safety at home for safe ADL engagement. Pt will benefit from continued education and practice of EC implementation, as well as education on DME. Pt expressed limitations regarding toileting and dressing this session. OT to continue per POC to facilitate progress towards functional goals. OT to recommend OP pulmonary therapy at d/c.       If plan is discharge home, recommend the following:  A little help with walking and/or transfers;Direct supervision/assist for medications management;Assist for transportation;Help with stairs or ramp for entrance;Assistance with cooking/housework;A little help with bathing/dressing/bathroom   Equipment Recommendations  BSC/3in1;Tub/shower seat    Recommendations for Other Services      Precautions / Restrictions Precautions Precautions: Fall Recall of Precautions/Restrictions: Intact Precaution/Restrictions Comments: Monitor SpO2 Restrictions Weight Bearing Restrictions Per Provider Order: No       Mobility Bed Mobility               General bed mobility comments: Not assessed, Pt greeted in recliner and requested to remain seated d/t fatigue.    Transfers                   General transfer comment: No out of recliner transfer this date     Balance Overall balance assessment: Needs assistance Sitting-balance support: Single extremity supported, Feet  supported Sitting balance-Leahy Scale: Good Sitting balance - Comments: Pt with one UE propping while seated in recliner. Required posterior support                                   ADL either performed or assessed with clinical judgement   ADL Overall ADL's : Needs assistance/impaired                                       General ADL Comments: Pt declined engagement in ADL tasks out of recliner this date. Pt able to verbalize energy conservation strategies to be implemented into ADL routine. Pt endorses difficulty with urinary toileting and endurance for dressing tasks. Pt educated on compensatory strategie for toileting and dressing. Pt states that he cannot sit for urination on toilet. Pt educated on DME for home safety. Pt apprehensive to DME and then said he is game.    Extremity/Trunk Assessment Upper Extremity Assessment Upper Extremity Assessment: Generalized weakness            Vision       Perception     Praxis     Communication Communication Communication: No apparent difficulties   Cognition Arousal: Alert Behavior During Therapy: WFL for tasks assessed/performed Cognition: No apparent impairments                               Following commands: Intact        Cueing   Cueing Techniques: Verbal  cues  Exercises      Shoulder Instructions       General Comments Pt greeted on O2. Pt demonstrates awareness into limitations but appears to have poor safety insight regarding safe DME use and compensatory strategies.    Pertinent Vitals/ Pain       Pain Assessment Pain Assessment: No/denies pain Pain Intervention(s): Monitored during session  Home Living                                          Prior Functioning/Environment              Frequency  Min 2X/week        Progress Toward Goals  OT Goals(current goals can now be found in the care plan section)  Progress towards OT  goals: Progressing toward goals  Acute Rehab OT Goals OT Goal Formulation: With patient Time For Goal Achievement: 04/23/24 Potential to Achieve Goals: Good ADL Goals Pt Will Perform Lower Body Bathing: with adaptive equipment;with set-up Pt Will Perform Lower Body Dressing: with min assist;with adaptive equipment Pt Will Transfer to Toilet: with supervision Pt Will Perform Toileting - Clothing Manipulation and hygiene: with supervision Additional ADL Goal #1: Pt able to return demonstration of EC strategies during ADL routine with supervision.  Plan      Co-evaluation                 AM-PAC OT 6 Clicks Daily Activity     Outcome Measure   Help from another person eating meals?: None Help from another person taking care of personal grooming?: A Little Help from another person toileting, which includes using toliet, bedpan, or urinal?: A Little Help from another person bathing (including washing, rinsing, drying)?: A Lot Help from another person to put on and taking off regular upper body clothing?: A Little Help from another person to put on and taking off regular lower body clothing?: A Lot 6 Click Score: 17    End of Session    OT Visit Diagnosis: Muscle weakness (generalized) (M62.81)   Activity Tolerance Patient tolerated treatment well   Patient Left in chair;with call bell/phone within reach;Other (comment) (MD in room)   Nurse Communication          Time: 8367-8355 OT Time Calculation (min): 12 min  Charges: OT General Charges $OT Visit: 1 Visit OT Treatments $Self Care/Home Management : 8-22 mins  Maurilio CROME, OTR/L.  [facility] Acute Rehabilitation  Office: [phone]   Maurilio PARAS Hadia Minier 04/14/2024, 5:22 PM

## 2024-04-14 NOTE — Progress Notes (Signed)
 PROGRESS NOTE Luis [name]  FMW:[id number] DOB: 1940-10-02 DOA: 04/07/2024 PCP: [name], MD  Brief Narrative/Hospital Course: Luis [name] is a 83 y.o. male with PMH of  metastatic colon cancer, mets to lungs, liver, LN's, sarcoidosis, COPD, CAD hx PCI, HFpEF, hypertension, hyperlipidemia, hypothyroidism, who presented with progressive shortness of breath onset x 1 wk  with  exertional dyspnea, chest congestion, minimally productive cough with clear and scant blood-tinged sputum, seen by pulmonology and was started on Augmentin  on Tuesday but by Thursday his symptoms started worsening and progressively worsened over the weekend.  He ended up checking his pulse oximeter at home which was reading in the high 60s so he presented to the ED  In the ED tachypneic, hypoxic,labs-mag low 1.5 hyperglycemic 291 lactic acidosis 3.3 Pro-Cal 1.5 leukocytosis 14.5, A1c 7.1, high sensitive troponin 29 proBNP 249 EKG sinus rhythm LAD LVH prolonged Qtc, situational up to 75% room air given 4 L nasal cannula Chest x-ray>> right lung base increased patchy airspace opacities and new left infrahilar airspace opacities concerning for multifocal pneumonia small right pleural effusion CT chest>> Postsurgical and radiation changes in the right lower lobe. Multifocal ground glass opacity with mosaic attenuation in the left lung, favoring mild multifocal infection/pneumonia, although pneumonitis related to immunotherapy is also possible.  Stable underlying sequela of sarcoidosis. Patient admitted for acute hypoxic respiratory failure with concern for multifocal pneumonia in the setting of metastatic colon cancer Home Eliquis continued, Lasix  trial given along with increased dose of prednisone  without significant improvement 10/3o Pulm consulted as patient dyspnea worsened on 10/30 10/31 s/p bronchoscopy,BAL sent for studies.culture  Subjective:  Seen and examined Reports he is doing well on rest but on  ambulation short of breath Vss on 4l [place], labs stable creatinine wt on admission 219>212>215> 205 lb  Inform nursing staff to wean down oxygen and ambulate  Assessment and plan:  Acute respiratory failure with hypoxia  New left lung patchy GGO concerning for acute inflammation or infection-multifocal pneumonia versus pneumonitis Immunocompromise status in the setting of chemotherapy History of sarcoidosis on long-term steroid: CT chest:Multifocal ground glass opacity with mosaic attenuation in the left lung, favoring mild multifocal infection/pneumonia, although pneumonitis related to immunotherapy is also possible Reports being on steroid for last 2 weeks 20 mg of prednisone  here needing 4 L [place] ON Eliquis PTA-less likelihood of PE. Echo showed normal EF.  70 to 75% G1 DD RV SF is normal RV size is normal Blood culture from admission NGTD. Cont Ceftriaxone , doxy and steroid. Does have edematous legs and started on IV diuresis 10/29 from p.o. bnp was up 237-having good urine output Initially felt some better again had worsening dyspnea 10/30 still needing 4 L nasal cannula, PCCM consulted s/p bronchoscopy BAL 10/31 -follow-up BAL culture data/cytology -- routine, fungal, AFB, and PJP DFA pending  Plans to continue IV antibiotics now on ceftriaxone  and doxycycline  Suspecting bacterial/atypical infection  Acute on chronic HFpEF Chronic leg edema: Oral Lasix  has been changed to IV.  weight a week ago was 216 pound He has been intentionally losing weight before that he was 240.Weight at 219 pound on admission now 212>215 remains the same.Cont to monitor daily I/O,weight, electrolytes and net balance as below.Keep on  salt/fluid restricted diet and monitor in tele. Net IO Since Admission: -4,150 mL [04/14/24 0847]  Filed Weights   04/11/24 0622 04/11/24 1221 04/14/24 0628  Weight: 97.7 kg 97.7 kg 93.2 kg    Recent Labs  Lab 04/08/24 0233 04/09/24 0510  04/09/24 0816 04/10/24 0419  04/11/24 0407 04/12/24 0840 04/13/24 0647 04/14/24 0409  BNP  --   --  237.9*  --   --   --   --   --   BUN 19   < >  --  19 25* 28* 27* 27*  CREATININE 0.79   < >  --  0.87 0.94 0.99 0.97 1.01  K 4.1   < >  --  3.5 3.2* 3.7 3.8 3.9  MG 1.8  --   --   --   --   --   --   --    < > = values in this interval not displayed.   Elevated troponin: suspect demand ischemia with flat troponin. TTE is unremarkable  Hypokalemia Hypomagnesemia: Resolved  Lactic acidosis: Likely in the setting of respiratory failure-volume depletion hyperglycemia.   New onset type 2 diabetes with uncontrolled hyperglycemia: A1c at 7.1.  Blood sugar up to 400-diet changed to carb controlled diet,started on glipizide and metformin ssi Blood sugar fairly controlled.  Suspect in the setting of his steroid use as well Recent Labs  Lab 04/12/24 2035 04/13/24 0841 04/13/24 1211 04/13/24 1639 04/13/24 2039  GLUCAP 216* 110* 176* 259* 175*    Metastatic colon cancer, R hemicolectomy, mets to lungs, liver, LN's: Followed by [facility], has known RUL nodule and opacities in the R base ? Lymphangitic spread v. Inflammation/infection (last in 6/'25).He is currently on maintenance 5FU and Panitumumab  every 2 weeks.  Sarcoidosis: Follows with pulmonology, appears that he is currently on chronic steroids   pHTN: Suggested on imaging, noted   HTN HLD CAD hx PCI: Bp stable, no chest pain. Cont his DOAC, rosuvastatin , zetia ,  Ranolazine    Aflutter: Rate controlled. Cont home Apixaban, Dronedarone    Aortic ectasia: OP surveillance   Hypothyroidism: Continue home levothyroxine    Chronic anemia:  Stable at baseline.B12 and folate normal.  Thrombocytopenia: Monitor platelet count, question if from chemo  ?Chronic pain: Continue home Gabapentin    ?Rosacea: continue home Minocycline.   Class  I Obesity w/ Body mass index is 28.66 kg/m.: will benefit with PCP follow-up, weight loss,healthy  lifestyle and outpatient sleep eval if not done.  PT Orders: Active PT Follow up Rec: Outpatient Pt (Pulmonary Rehab)04/11/2024 1600    DVT prophylaxis: Eliquis Code Status:   Code Status: Prior Family Communication: plan of care discussed with patient at bedside. Patient status is: Remains hospitalized because of severity of illness Level of care: Telemetry Medical   Dispo: The patient is from: home.            Anticipated disposition: TBD. Objective: Vitals last 24 hrs: Vitals:   04/13/24 1009 04/13/24 1710 04/13/24 2100 04/14/24 0628  BP: 123/80 138/73  (!) 146/81  Pulse: 81 73 76 80  Resp: 17 18  18   Temp: 97.8 F (36.6 C) 97.7 F (36.5 C)  97.8 F (36.6 C)  TempSrc: Oral Oral  Oral  SpO2: 90% (!) 79% (!) 87% 98%  Weight:    93.2 kg  Height:       Physical Examination: General exam:AAOX3, pleasant HEENT:Oral mucosa moist, Ear/Nose WNL grossly Respiratory system:  CTA upper lungs and diminished at base-overall mostly clear, no use of accessory muscle Cardiovascular system: S1 & S2 +, No JVD. Gastrointestinal system: Abdomen soft,NT,ND, BS+ Nervous System: Alert, awake, moving all extremities,and following commands. Extremities: extremities warm, leg edema  ++ Skin: Warm, no rashes MSK: Normal muscle bulk,tone, power.   Medications reviewed:  Scheduled Meds:  apixaban  5 mg Oral BID   budesonide -glycopyrrolate-formoterol  2 puff Inhalation BID   doxycycline   100 mg Oral Q12H   dronedarone   400 mg Oral BID WC   ezetimibe   10 mg Oral Daily   furosemide   40 mg Intravenous BID   gabapentin   200 mg Oral QHS   glipiZIDE  2.5 mg Oral QAC breakfast   guaiFENesin   600 mg Oral BID   insulin aspart  0-15 Units Subcutaneous TID WC   levothyroxine   50 mcg Oral Q0600   metFORMIN  500 mg Oral BID WC   minocycline  100 mg Oral Daily   montelukast   10 mg Oral QHS   predniSONE   40 mg Oral Q breakfast   rosuvastatin   20 mg Oral Daily   Continuous Infusions:  cefTRIAXone   (ROCEPHIN )  IV 2 g (04/13/24 1813)    Diet: Diet Order             Diet Carb Modified Fluid consistency: Thin; Room service appropriate? Yes  Diet effective now                    Data Reviewed: I have personally reviewed following labs and imaging studies ( see epic result tab) CBC: Recent Labs  Lab 04/10/24 0419 04/11/24 0407 04/12/24 0626 04/12/24 1056 04/14/24 1039  WBC 12.9* 11.7* 12.1* 14.5* 15.5*  HGB 12.2* 12.1* 12.5* 12.1* 12.7*  HCT 38.4* 37.9* 39.4 38.6* 39.9  MCV 98.0 96.7 97.8 97.0 95.7  PLT 131* 127* 161 162 167   CMP: Recent Labs  Lab 04/08/24 0233 04/09/24 0510 04/10/24 0419 04/11/24 0407 04/12/24 0840 04/13/24 0647 04/14/24 0409  NA 133*   < > 137 134* 135 134* 136  K 4.1   < > 3.5 3.2* 3.7 3.8 3.9  CL 98   < > 96* 96* 94* 93* 95*  CO2 23   < > 27 25 26 27 30   GLUCOSE 113*   < > 99 127* 169* 114* 104*  BUN 19   < > 19 25* 28* 27* 27*  CREATININE 0.79   < > 0.87 0.94 0.99 0.97 1.01  CALCIUM  8.4*   < > 8.3* 8.0* 8.2* 8.2* 8.4*  MG 1.8  --   --   --   --   --   --    < > = values in this interval not displayed.   GFR: Estimated Creatinine Clearance: 64.7 mL/min (by C-G formula based on SCr of 1.01 mg/dL). No results for input(s): AST, ALT, ALKPHOS, BILITOT, PROT, ALBUMIN in the last 168 hours. No results for input(s): LIPASE, AMYLASE in the last 168 hours. No results for input(s): AMMONIA in the last 168 hours. Coagulation Profile:  No results for input(s): INR, PROTIME in the last 168 hours.  Unresulted Labs (From admission, onward)     Start     Ordered   04/13/24 0500  Basic metabolic panel with GFR  Daily,   R      04/12/24 1038   04/12/24 1039  CBC  Every 48 hours,   R      04/12/24 1038   04/11/24 1312  KOH prep  RELEASE UPON ORDERING,   TIMED       Comments: Specimen A: Specimen Description LUL BAL Phone [phone] Immunocompromised?  No  Antibiotic Treatment:  none Is the patient on airborne/droplet  precautions? No Clinical History:  N/A Special Instructions:  none Specimen Disposition:  Microbiology  04/11/24 1312   04/11/24 1312  Pneumocystis smear by DFA  RELEASE UPON ORDERING,   TIMED       Comments: Specimen A: Specimen Description LUL BAL Phone [phone] Immunocompromised?  No  Antibiotic Treatment:  none Is the patient on airborne/droplet precautions? No Clinical History:  N/A Special Instructions:  none Specimen Disposition:  Microbiology     04/11/24 1312   04/11/24 1312  Fungus Culture With Stain  RELEASE UPON ORDERING,   TIMED       Comments: Specimen A: Specimen Description LUL BAL Phone [phone] Immunocompromised?  No  Antibiotic Treatment:  none Is the patient on airborne/droplet precautions? No Clinical History:  N/A Special Instructions:  none Specimen Disposition:  Microbiology     04/11/24 1312   04/11/24 1312  Acid Fast Culture with reflexed sensitivities  RELEASE UPON ORDERING,   TIMED       Comments: Specimen A: Specimen Description LUL BAL Phone [phone] Immunocompromised?  No  Antibiotic Treatment:  none Is the patient on airborne/droplet precautions? No Clinical History:  N/A Special Instructions:  none Specimen Disposition:  Microbiology     04/11/24 1312           Antimicrobials/Microbiology: Anti-infectives (From admission, onward)    Start     Dose/Rate Route Frequency Ordered Stop   04/13/24 2200  doxycycline  (VIBRA -TABS) tablet 100 mg        100 mg Oral Every 12 hours 04/13/24 1241     04/12/24 1800  cefTRIAXone  (ROCEPHIN ) 2 g in sodium chloride  0.9 % 100 mL IVPB        2 g 200 mL/hr over 30 Minutes Intravenous Every 24 hours 04/12/24 1655     04/12/24 1800  doxycycline  (VIBRAMYCIN ) 100 mg in sodium chloride  0.9 % 250 mL IVPB  Status:  Discontinued        100 mg 125 mL/hr over 120 Minutes Intravenous Every 12 hours 04/12/24 1655 04/13/24 1240   04/08/24 1000  azithromycin  (ZITHROMAX ) tablet 500 mg        500 mg Oral  Daily 04/07/24 0238 04/09/24 0852   04/08/24 0000  cefTRIAXone  (ROCEPHIN ) 1 g in sodium chloride  0.9 % 100 mL IVPB        1 g 200 mL/hr over 30 Minutes Intravenous Every 24 hours 04/07/24 0238 04/11/24 0815   04/07/24 1000  minocycline (MINOCIN) capsule 100 mg        100 mg Oral Daily 04/07/24 0241     04/07/24 0130  cefTRIAXone  (ROCEPHIN ) 2 g in sodium chloride  0.9 % 100 mL IVPB        2 g 200 mL/hr over 30 Minutes Intravenous  Once 04/07/24 0124 04/07/24 0255   04/07/24 0130  azithromycin  (ZITHROMAX ) 500 mg in sodium chloride  0.9 % 250 mL IVPB        500 mg 250 mL/hr over 60 Minutes Intravenous  Once 04/07/24 0124 04/07/24 0337         Component Value Date/Time   SDES BRONCHIAL ALVEOLAR LAVAGE 04/11/2024 1307   SPECREQUEST LUL 04/11/2024 1307   CULT  04/11/2024 1307    NO GROWTH Performed at [facility], [address], [place], [name] [address]    REPTSTATUS 04/13/2024 FINAL 04/11/2024 1307    Procedures: Procedure(s) (LRB): VIDEO BRONCHOSCOPY WITHOUT FLUORO (N/A) Mennie LAMY, MD [facility] 04/14/2024, 1:22 PM

## 2024-04-14 NOTE — Plan of Care (Signed)
  Problem: Education: Goal: Ability to describe self-care measures that may prevent or decrease complications (Diabetes Survival Skills Education) will improve Outcome: Progressing   Problem: Coping: Goal: Ability to adjust to condition or change in health will improve Outcome: Progressing   Problem: Metabolic: Goal: Ability to maintain appropriate glucose levels will improve Outcome: Progressing   Problem: Nutritional: Goal: Maintenance of adequate nutrition will improve Outcome: Progressing   Problem: Activity: Goal: Risk for activity intolerance will decrease Outcome: Progressing   Problem: Safety: Goal: Ability to remain free from injury will improve Outcome: Progressing

## 2024-04-14 NOTE — Progress Notes (Signed)
 Mobility Specialist Progress Note:    04/14/24 1105  Mobility  Activity Ambulated with assistance (In hallway)  Level of Assistance Standby assist, set-up cues, supervision of patient - no hands on  Assistive Device Other (Comment) (IV Pole)  Distance Ambulated (ft) 200 ft  Activity Response Tolerated well  Mobility Referral Yes  Mobility visit 1 Mobility  Mobility Specialist Start Time (ACUTE ONLY) 1106  Mobility Specialist Stop Time (ACUTE ONLY) 1130  Mobility Specialist Time Calculation (min) (ACUTE ONLY) 24 min   Received pt in chair and agreeable to mobility. Pt on 4 L/min upon arrival. Pt required 6 L/min O2 during ambulation to maintain SPO2, however pt displaying and sounding much better this session. Took 3x brief seated rest breaks d/t fatigue. No c/o. Pt returned back to EOB with personal belongings and call light within reach. All needs met.  Lavanda Pollack Mobility Specialist  Please contact via [employer] or  Rehab Office [phone]

## 2024-04-15 ENCOUNTER — Telehealth: Payer: Self-pay | Admitting: Pulmonary Disease

## 2024-04-15 DIAGNOSIS — J45909 Unspecified asthma, uncomplicated: Secondary | ICD-10-CM

## 2024-04-15 DIAGNOSIS — J9601 Acute respiratory failure with hypoxia: Secondary | ICD-10-CM | POA: Diagnosis not present

## 2024-04-15 DIAGNOSIS — M7989 Other specified soft tissue disorders: Secondary | ICD-10-CM | POA: Diagnosis not present

## 2024-04-15 DIAGNOSIS — J189 Pneumonia, unspecified organism: Secondary | ICD-10-CM | POA: Diagnosis not present

## 2024-04-15 LAB — BASIC METABOLIC PANEL WITH GFR
Anion gap: 11 (ref 5–15)
BUN: 23 mg/dL (ref 8–23)
CO2: 31 mmol/L (ref 22–32)
Calcium: 8.5 mg/dL — ABNORMAL LOW (ref 8.9–10.3)
Chloride: 95 mmol/L — ABNORMAL LOW (ref 98–111)
Creatinine, Ser: 0.95 mg/dL (ref 0.61–1.24)
GFR, Estimated: >60 mL/min (ref >60)
Glucose, Bld: 90 mg/dL (ref 70–99)
Potassium: 3.8 mmol/L (ref 3.5–5.1)
Sodium: 137 mmol/L (ref 135–145)

## 2024-04-15 LAB — GLUCOSE, CAPILLARY
Glucose-Capillary: 115 mg/dL — ABNORMAL HIGH (ref 70–99)
Glucose-Capillary: 134 mg/dL — ABNORMAL HIGH (ref 70–99)
Glucose-Capillary: 142 mg/dL — ABNORMAL HIGH (ref 70–99)
Glucose-Capillary: 187 mg/dL — ABNORMAL HIGH (ref 70–99)

## 2024-04-15 MED ORDER — PREDNISONE 20 MG PO TABS
30.0000 mg | ORAL_TABLET | Freq: Every day | ORAL | Status: DC
  Administered 2024-04-15 – 2024-04-19 (×5): 30 mg via ORAL
  Filled 2024-04-15 (×5): qty 1

## 2024-04-15 MED ORDER — MINOCYCLINE HCL 50 MG PO CAPS
100.0000 mg | ORAL_CAPSULE | Freq: Every day | ORAL | Status: DC
  Administered 2024-04-21 – 2024-05-09 (×18): 100 mg via ORAL
  Filled 2024-04-15 (×20): qty 2

## 2024-04-15 NOTE — Telephone Encounter (Signed)
 Patient scheduled in 2 weeks with Dr.Hunsucker

## 2024-04-15 NOTE — Plan of Care (Signed)

## 2024-04-15 NOTE — Plan of Care (Signed)
 Patient was sitting on a chair, on 2lpm via [place]. Pt verbalized that he feels more congested today and more dyspneic. Tried to wean him off to O2 but wasn't able to as patient was having more SOB. VS taken and recorded. Call bell within reach. Needs attended. Health education rendered. No complaints of pain. Problem: Education: Goal: Ability to describe self-care measures that may prevent or decrease complications (Diabetes Survival Skills Education) will improve Outcome: Progressing Goal: Individualized Educational Video(s) Outcome: Progressing   Problem: Coping: Goal: Ability to adjust to condition or change in health will improve Outcome: Progressing   Problem: Fluid Volume: Goal: Ability to maintain a balanced intake and output will improve Outcome: Progressing   Problem: Health Behavior/Discharge Planning: Goal: Ability to identify and utilize available resources and services will improve Outcome: Progressing Goal: Ability to manage health-related needs will improve Outcome: Progressing   Problem: Metabolic: Goal: Ability to maintain appropriate glucose levels will improve Outcome: Progressing   Problem: Nutritional: Goal: Maintenance of adequate nutrition will improve Outcome: Progressing Goal: Progress toward achieving an optimal weight will improve Outcome: Progressing   Problem: Skin Integrity: Goal: Risk for impaired skin integrity will decrease Outcome: Progressing   Problem: Tissue Perfusion: Goal: Adequacy of tissue perfusion will improve Outcome: Progressing   Problem: Education: Goal: Knowledge of General Education information will improve Description: Including pain rating scale, medication(s)/side effects and non-pharmacologic comfort measures Outcome: Progressing   Problem: Health Behavior/Discharge Planning: Goal: Ability to manage health-related needs will improve Outcome: Progressing   Problem: Clinical Measurements: Goal: Ability to maintain  clinical measurements within normal limits will improve Outcome: Progressing Goal: Will remain free from infection Outcome: Progressing Goal: Diagnostic test results will improve Outcome: Progressing Goal: Respiratory complications will improve Outcome: Progressing Goal: Cardiovascular complication will be avoided Outcome: Progressing   Problem: Activity: Goal: Risk for activity intolerance will decrease Outcome: Progressing   Problem: Nutrition: Goal: Adequate nutrition will be maintained Outcome: Progressing   Problem: Coping: Goal: Level of anxiety will decrease Outcome: Progressing   Problem: Elimination: Goal: Will not experience complications related to bowel motility Outcome: Progressing Goal: Will not experience complications related to urinary retention Outcome: Progressing   Problem: Pain Managment: Goal: General experience of comfort will improve and/or be controlled Outcome: Progressing   Problem: Safety: Goal: Ability to remain free from injury will improve Outcome: Progressing   Problem: Skin Integrity: Goal: Risk for impaired skin integrity will decrease Outcome: Progressing

## 2024-04-15 NOTE — Progress Notes (Signed)
 Physical Therapy Treatment Patient Details Name: Luis [name] MRN: [id number] DOB: 1940-10-22 Today's Date: 04/15/2024   History of Present Illness 83 y.o. male presents to [facility] hospital on 04/07/2024 with progressive SOB. Pt underwent bronchoscopy with biopsies 10/31. PMH includes metastatic colon CA with mets to lungs, liver, and lymph nodes, sarcoidosis, COPD, CAD, HFpEF, HTN, HLD, hypothyroidism.    PT Comments  Pt up in chair on arrival, agreeable to session in room as pt with urinary frequency throughout day and pt declining ambulation. Pt able to come to stand to RW with supervision x4 throughout session and perform marching in place for increased activity tolerance and cardiopulmonary endurance ~45-60 seconds each stand.  Pt with drop in SpO2 during activity on 4L to 82-85% requiring seated rest, cues for pursed lip breathing and increased time to rebound to >90% on 4L after first, second, and fourth stand. Pt requiring increase to 6L after 3rd stand to rebound to >90%. Pt up in chair at end of session and verbalizing understanding of importance of frequent mobility to maximize functional gains. Pt continues to benefit from skilled PT services to progress toward functional mobility goals.     If plan is discharge home, recommend the following: Assistance with cooking/housework;Assist for transportation;Help with stairs or ramp for entrance   Can travel by private vehicle        Equipment Recommendations  None recommended by PT    Recommendations for Other Services       Precautions / Restrictions Precautions Precautions: Fall Recall of Precautions/Restrictions: Intact Precaution/Restrictions Comments: Monitor SpO2 Restrictions Weight Bearing Restrictions Per Provider Order: No     Mobility  Bed Mobility Overal bed mobility: Needs Assistance (patient sitting EOB upon arrival into room)             General bed mobility comments: Not assessed, Pt greeted in recliner     Transfers Overall transfer level: Needs assistance Equipment used: Rolling walker (2 wheels) Transfers: Sit to/from Stand Sit to Stand: Supervision           General transfer comment: supervision for safety, standing x4 during session, light cues to back all the way up to chair prior to sitting    Ambulation/Gait               General Gait Details: pt declining ambulation this session due to urinary frequency with new Lasix    Stairs             Wheelchair Mobility     Tilt Bed    Modified Rankin (Stroke Patients Only)       Balance Overall balance assessment: Needs assistance Sitting-balance support: Single extremity supported, Feet supported Sitting balance-Leahy Scale: Good Sitting balance - Comments: Pt with one UE propping while seated in recliner. Required posterior support   Standing balance support: Bilateral upper extremity supported, During functional activity, Reliant on assistive device for balance Standing balance-Leahy Scale: Poor Standing balance comment: Pt dependent on AD                            Communication Communication Communication: No apparent difficulties  Cognition Arousal: Alert Behavior During Therapy: WFL for tasks assessed/performed   PT - Cognitive impairments: No apparent impairments                         Following commands: Intact      Cueing Cueing Techniques: Verbal cues  Exercises  Other Exercises Other Exercises: standing marching in place x4 rounds ~45-60 seconds ea round    General Comments General comments (skin integrity, edema, etc.): pt greeted on 4L O2 with Spo2 95%, SpO2 dropping to 82-85% on 4L with standing activity, able to rebound to >90% after inital 2 stands and final stand, unable after 3rd stand with pt requiring 6L to rebound to >90% with pt then able to maintain >90% at rest titrated back to 4L. pt on 4L at end of session with SpO2 92%      Pertinent Vitals/Pain  Pain Assessment Pain Assessment: Faces Faces Pain Scale: Hurts a little bit Pain Location: Chest Pain Descriptors / Indicators: Pressure, Tightness Pain Intervention(s): Monitored during session, Limited activity within patient's tolerance    Home Living                          Prior Function            PT Goals (current goals can now be found in the care plan section) Acute Rehab PT Goals Patient Stated Goal: Improve activity tolerance and not need oxygen PT Goal Formulation: With patient/family Time For Goal Achievement: 04/22/24 Progress towards PT goals: Progressing toward goals    Frequency    Min 2X/week      PT Plan      Co-evaluation              AM-PAC PT 6 Clicks Mobility   Outcome Measure  Help needed turning from your back to your side while in a flat bed without using bedrails?: A Little Help needed moving from lying on your back to sitting on the side of a flat bed without using bedrails?: A Little Help needed moving to and from a bed to a chair (including a wheelchair)?: A Little Help needed standing up from a chair using your arms (e.g., wheelchair or bedside chair)?: A Little Help needed to walk in hospital room?: A Little Help needed climbing 3-5 steps with a railing? : A Little 6 Click Score: 18    End of Session Equipment Utilized During Treatment: Oxygen Activity Tolerance: Patient tolerated treatment well;Patient limited by fatigue Patient left: in chair;with call bell/phone within reach Nurse Communication: Mobility status PT Visit Diagnosis: Other abnormalities of gait and mobility (R26.89)     Time: 8558-8492 PT Time Calculation (min) (ACUTE ONLY): 26 min  Charges:    $Therapeutic Exercise: 23-37 mins PT General Charges $$ ACUTE PT VISIT: 1 Visit                     Therisa R. PTA Acute Rehabilitation Services Office: [phone]   Therisa CHRISTELLA Boor 04/15/2024, 4:39 PM

## 2024-04-15 NOTE — Progress Notes (Signed)
 PROGRESS NOTE Luis [name]  FMW:[id number] DOB: Mar 27, 1941 DOA: 04/07/2024 PCP: [name], MD  Brief Narrative/Hospital Course: Luis [name] is a 83 y.o. male with PMH of  metastatic colon cancer, mets to lungs, liver, LN's, sarcoidosis, COPD, CAD hx PCI, HFpEF, hypertension, hyperlipidemia, hypothyroidism, who presented with progressive shortness of breath onset x 1 wk  with  exertional dyspnea, chest congestion, minimally productive cough with clear and scant blood-tinged sputum, seen by pulmonology and was started on Augmentin  on Tuesday but by Thursday his symptoms started worsening and progressively worsened over the weekend.  He ended up checking his pulse oximeter at home which was reading in the high 60s so he presented to the ED  In the ED tachypneic, hypoxic,labs-mag low 1.5 hyperglycemic 291 lactic acidosis 3.3 Pro-Cal 1.5 leukocytosis 14.5, A1c 7.1, high sensitive troponin 29 proBNP 249 EKG sinus rhythm LAD LVH prolonged Qtc, situational up to 75% room air given 4 L nasal cannula Chest x-ray>> right lung base increased patchy airspace opacities and new left infrahilar airspace opacities concerning for multifocal pneumonia small right pleural effusion CT chest>> Postsurgical and radiation changes in the right lower lobe. Multifocal ground glass opacity with mosaic attenuation in the left lung, favoring mild multifocal infection/pneumonia, although pneumonitis related to immunotherapy is also possible.  Stable underlying sequela of sarcoidosis. Patient admitted for acute hypoxic respiratory failure with concern for multifocal pneumonia in the setting of metastatic colon cancer Home Eliquis continued, Lasix  trial given along with increased dose of prednisone  without significant improvement 10/3o Pulm consulted as patient dyspnea worsened on 10/30 10/31 s/p bronchoscopy,BAL sent for studies.culture  Subjective:  Seen and examined Today he reports more short of breath with  ambulation, legs are less swollen than yesterday after increasing Lasix  Denies nausea vomiting chest pain Overnight remains afebrile BP stable wt on admission 219>212>215> 205>216 lb   Assessment and plan:  Acute respiratory failure with hypoxia  New left lung patchy GGO concerning for acute inflammation or infection-multifocal pneumonia versus pneumonitis Immunocompromise status in the setting of chemotherapy History of sarcoidosis on long-term steroid: CT chest:Multifocal GGO/mosaic attenuation in the left lung, favoring mild multifocal infection/pneumonia-? pneumonitis related to immunotherapy Reports being on steroid for last 2 weeks 20 mg of prednisone  here needing 4 L [place] Eliquis PTA-less likelihood of PE. Echo showed normal EF.  70 to 75% G1 DD RV SF is normal RV size is normal Blood culture from admission NGTD  Started on IV diuresis 10/29 from p.o. bnp was up 237 Initially felt some better again had worsening dyspnea 10/30 still needing 4 L nasal cannula, PCCM consulted s/p bronchoscopy BAL 10/31 - BAL culture-no growth to date AFB fungus PJP DFA pending  Wean oxygen as tolerated hopefully we can discharge him soon on oral antibiotics pending pulmonary clearance-for 14 days of antibiotics Lasix  increased to 80 mg Q6 on 11/3 per PCCM-swelling somewhat better but continues to have dyspnea with ambulation Appreciate pulmonary input is on board.  Cont steroid 30mg , lasix  antibiotics  Acute on chronic HFpEF Chronic leg edema: Oral Lasix  has been changed to IV.  weight a week ago was 216 pound He has been intentionally losing weight before that he was 240.Weight at 219 pound on admission now 212>215> 205> 216, with ongoing leg edema Lasix  increased 11/3 Cont to monitor daily I/O,weight, electrolytes and net balance as below.Keep on  salt/fluid restricted diet and monitor in tele. Net IO Since Admission: -4,480 mL [04/15/24 1138]  Filed Weights   04/11/24 1221  04/14/24 0628 04/15/24  0231  Weight: 97.7 kg 93.2 kg 98 kg    Recent Labs  Lab 04/09/24 0816 04/10/24 0419 04/11/24 0407 04/12/24 0840 04/13/24 0647 04/14/24 0409 04/15/24 0538  BNP 237.9*  --   --   --   --   --   --   BUN  --    < > 25* 28* 27* 27* 23  CREATININE  --    < > 0.94 0.99 0.97 1.01 0.95  K  --    < > 3.2* 3.7 3.8 3.9 3.8   < > = values in this interval not displayed.   Elevated troponin: suspect demand ischemia with flat troponin. TTE is unremarkable  Hypokalemia Hypomagnesemia: Resolved  Lactic acidosis: Likely in the setting of respiratory failure-volume depletion hyperglycemia.   New onset type 2 diabetes with uncontrolled hyperglycemia: A1c at 7.1.  Blood sugar up to 400-diet changed to carb controlled diet,started on glipizide and metformin ssi Blood sugar fairly controlled.  Suspect in the setting of his steroid use as well Recent Labs  Lab 04/14/24 0850 04/14/24 1202 04/14/24 1753 04/14/24 2147 04/15/24 0811  GLUCAP 127* 188* 205* 156* 115*    Metastatic colon cancer, R hemicolectomy, mets to lungs, liver, LN's: Followed by [facility], has known RUL nodule and opacities in the R base ? Lymphangitic spread v. Inflammation/infection (last in 6/'25).He is currently on maintenance 5FU and Panitumumab  every 2 weeks.  Sarcoidosis: Follows with pulmonology, appears that he is currently on chronic steroids   pHTN: Suggested on imaging, noted   HTN HLD CAD hx PCI: Bp stable, no chest pain. Cont his DOAC, rosuvastatin , zetia ,  Ranolazine    Aflutter: Rate controlled. Cont home Apixaban, Dronedarone    Aortic ectasia: OP surveillance   Hypothyroidism: Continue home levothyroxine    Chronic anemia:  Stable at baseline.B12 and folate normal.  Thrombocytopenia: Monitor platelet count, question if from chemo  ?Chronic pain: Continue home Gabapentin    ?Rosacea: continue home Minocycline.   Class  I Obesity w/ Body mass index is 30.13 kg/m.: will benefit with  PCP follow-up, weight loss,healthy lifestyle and outpatient sleep eval if not done.  PT Orders: Active PT Follow up Rec: Outpatient Pt (Pulmonary Rehab)04/11/2024 1600    DVT prophylaxis: Eliquis Code Status:   Code Status: Prior Family Communication: plan of care discussed with patient at bedside. Patient status is: Remains hospitalized because of severity of illness Level of care: Telemetry Medical   Dispo: The patient is from: home.            Anticipated disposition: TBD. Objective: Vitals last 24 hrs: Vitals:   04/15/24 0231 04/15/24 0548 04/15/24 0816 04/15/24 1015  BP:  (!) 160/86 (!) 146/58   Pulse:  80 81   Resp:   18   Temp:  97.7 F (36.5 C)    TempSrc:  Oral    SpO2:  94% 97% 92%  Weight: 98 kg     Height:       Physical Examination: General exam:AAOX3 pleasant, obese, NAD HEENT:Oral mucosa moist, Ear/Nose WNL grossly Respiratory system: Bilateral diminished at base, clear upper airways, no use of accessory muscle Cardiovascular system: S1 & S2 +, No JVD. Gastrointestinal system: Abdomen soft,NT,ND, BS+ Nervous System: Alert, awake, moving all extremities,and following commands. Extremities: extremities warm, leg edema  ++ Skin: Warm, no rashes MSK: Normal muscle bulk,tone, power.   Medications reviewed:  Scheduled Meds:  apixaban  5 mg Oral BID   budesonide -glycopyrrolate-formoterol  2 puff Inhalation BID  doxycycline   100 mg Oral Q12H   dronedarone   400 mg Oral BID WC   ezetimibe   10 mg Oral Daily   furosemide   80 mg Intravenous Q6H   gabapentin   200 mg Oral QHS   glipiZIDE  2.5 mg Oral QAC breakfast   guaiFENesin   600 mg Oral BID   insulin aspart  0-15 Units Subcutaneous TID WC   levothyroxine   50 mcg Oral Q0600   metFORMIN  500 mg Oral BID WC   minocycline  100 mg Oral Daily   montelukast   10 mg Oral QHS   predniSONE   30 mg Oral Q breakfast   rosuvastatin   20 mg Oral Daily   Continuous Infusions:  cefTRIAXone  (ROCEPHIN )  IV 2 g (04/14/24  1750)    Diet: Diet Order             Diet Carb Modified Fluid consistency: Thin; Room service appropriate? Yes  Diet effective now                    Data Reviewed: I have personally reviewed following labs and imaging studies ( see epic result tab) CBC: Recent Labs  Lab 04/10/24 0419 04/11/24 0407 04/12/24 0626 04/12/24 1056 04/14/24 1039  WBC 12.9* 11.7* 12.1* 14.5* 15.5*  HGB 12.2* 12.1* 12.5* 12.1* 12.7*  HCT 38.4* 37.9* 39.4 38.6* 39.9  MCV 98.0 96.7 97.8 97.0 95.7  PLT 131* 127* 161 162 167   CMP: Recent Labs  Lab 04/11/24 0407 04/12/24 0840 04/13/24 0647 04/14/24 0409 04/15/24 0538  NA 134* 135 134* 136 137  K 3.2* 3.7 3.8 3.9 3.8  CL 96* 94* 93* 95* 95*  CO2 25 26 27 30 31   GLUCOSE 127* 169* 114* 104* 90  BUN 25* 28* 27* 27* 23  CREATININE 0.94 0.99 0.97 1.01 0.95  CALCIUM  8.0* 8.2* 8.2* 8.4* 8.5*   GFR: Estimated Creatinine Clearance: 70.3 mL/min (by C-G formula based on SCr of 0.95 mg/dL). No results for input(s): AST, ALT, ALKPHOS, BILITOT, PROT, ALBUMIN in the last 168 hours. No results for input(s): LIPASE, AMYLASE in the last 168 hours. No results for input(s): AMMONIA in the last 168 hours. Coagulation Profile:  No results for input(s): INR, PROTIME in the last 168 hours.  Unresulted Labs (From admission, onward)     Start     Ordered   04/13/24 0500  Basic metabolic panel with GFR  Daily,   R      04/12/24 1038   04/12/24 1039  CBC  Every 48 hours,   R      04/12/24 1038   04/11/24 1312  KOH prep  RELEASE UPON ORDERING,   TIMED       Comments: Specimen A: Specimen Description LUL BAL Phone [phone] Immunocompromised?  No  Antibiotic Treatment:  none Is the patient on airborne/droplet precautions? No Clinical History:  N/A Special Instructions:  none Specimen Disposition:  Microbiology     04/11/24 1312   04/11/24 1312  Pneumocystis smear by DFA  RELEASE UPON ORDERING,   TIMED       Comments: Specimen A:  Specimen Description LUL BAL Phone [phone] Immunocompromised?  No  Antibiotic Treatment:  none Is the patient on airborne/droplet precautions? No Clinical History:  N/A Special Instructions:  none Specimen Disposition:  Microbiology     04/11/24 1312   04/11/24 1312  Fungus Culture With Stain  RELEASE UPON ORDERING,   TIMED       Comments: Specimen A: Specimen Description LUL  BAL Phone [phone] Immunocompromised?  No  Antibiotic Treatment:  none Is the patient on airborne/droplet precautions? No Clinical History:  N/A Special Instructions:  none Specimen Disposition:  Microbiology     04/11/24 1312   04/11/24 1312  Acid Fast Culture with reflexed sensitivities  RELEASE UPON ORDERING,   TIMED       Comments: Specimen A: Specimen Description LUL BAL Phone [phone] Immunocompromised?  No  Antibiotic Treatment:  none Is the patient on airborne/droplet precautions? No Clinical History:  N/A Special Instructions:  none Specimen Disposition:  Microbiology     04/11/24 1312           Antimicrobials/Microbiology: Anti-infectives (From admission, onward)    Start     Dose/Rate Route Frequency Ordered Stop   04/13/24 2200  doxycycline  (VIBRA -TABS) tablet 100 mg        100 mg Oral Every 12 hours 04/13/24 1241     04/12/24 1800  cefTRIAXone  (ROCEPHIN ) 2 g in sodium chloride  0.9 % 100 mL IVPB        2 g 200 mL/hr over 30 Minutes Intravenous Every 24 hours 04/12/24 1655     04/12/24 1800  doxycycline  (VIBRAMYCIN ) 100 mg in sodium chloride  0.9 % 250 mL IVPB  Status:  Discontinued        100 mg 125 mL/hr over 120 Minutes Intravenous Every 12 hours 04/12/24 1655 04/13/24 1240   04/08/24 1000  azithromycin  (ZITHROMAX ) tablet 500 mg        500 mg Oral Daily 04/07/24 0238 04/09/24 0852   04/08/24 0000  cefTRIAXone  (ROCEPHIN ) 1 g in sodium chloride  0.9 % 100 mL IVPB        1 g 200 mL/hr over 30 Minutes Intravenous Every 24 hours 04/07/24 0238 04/11/24 0815   04/07/24 1000   minocycline (MINOCIN) capsule 100 mg        100 mg Oral Daily 04/07/24 0241     04/07/24 0130  cefTRIAXone  (ROCEPHIN ) 2 g in sodium chloride  0.9 % 100 mL IVPB        2 g 200 mL/hr over 30 Minutes Intravenous  Once 04/07/24 0124 04/07/24 0255   04/07/24 0130  azithromycin  (ZITHROMAX ) 500 mg in sodium chloride  0.9 % 250 mL IVPB        500 mg 250 mL/hr over 60 Minutes Intravenous  Once 04/07/24 0124 04/07/24 0337         Component Value Date/Time   SDES BRONCHIAL ALVEOLAR LAVAGE 04/11/2024 1307   SPECREQUEST LUL 04/11/2024 1307   CULT  04/11/2024 1307    NO GROWTH Performed at [facility], [address] [place], [name] [address]    REPTSTATUS 04/13/2024 FINAL 04/11/2024 1307    Procedures: Procedure(s) (LRB): VIDEO BRONCHOSCOPY WITHOUT FLUORO (N/A) Mennie LAMY, MD [facility] 04/15/2024, 11:38 AM

## 2024-04-15 NOTE — Telephone Encounter (Signed)
 Please schedule hospital follow up visit with Dr. Annella or Almarie Ferrari, NP in 2-3 weeks. Thank you!

## 2024-04-15 NOTE — Progress Notes (Addendum)
 NAME:  Luis [name], MRN:  [id number], DOB:  1940/07/22, LOS: 8 ADMISSION DATE:  04/07/2024, CONSULTATION DATE:  10/30 REFERRING MD:  [facility], CHIEF COMPLAINT:  acute resp failure   History of Present Illness:  Luis [name] is an 83 y/o gentleman with a history of pulmonary sarcoidosis on chronic steriods, COPD-asthma overlap on triple inhaled therapy & Dupixent , stage IV cecal adenocarcinoma being treated at [facility] since 2019 who presented to the hospital with progressive shortness of breath and dry cough over 4 days on 04/07/2024.  He had been seen in pulmonary clinic today following his symptoms starting on 7/23.  He had an exacerbation of his respiratory disease in March and September of this year.  Since his exacerbation in September he had gone back on prednisone , tapering from 30 mg to 20 mg to 10 mg daily.  When he got back down to 10 mg daily dose he felt his symptoms flared up again, similar to tapers done in the 1990s when he was on chronic steroids for sarcoidosis.  He has only been on intermittent steroids since 1990s; he has not been on steroid sparing DMARD therapy due to quiescent sarcoidosis over the years.  He has not had sick contacts recently.  No new symptoms other than some leg swelling consistent with hospital.  No fevers.  He has had green sputum production in the hospital, but did not have sputum at home.  He is not on home oxygen.  He last had chemotherapy 2 weeks ago-pembrolizumab and 5-fluorouracil .  He felt well after this for a few days before starting to feel run down.  His meds are not to take primary support.  He is on Eliquis for DVT diagnosed in late July 2025 & Afib.  Patient hospital because required 4 to 6 L nasal cannula.  He has been maintained on azithromycin , ceftriaxone , prednisone  40mg  daily. Overall he felt a little better yesterday but is more dyspneic again today.   Pertinent  Medical History  Afib, recent cardioversion Metastatic adenocarcinoma of  cecum Pulmonary sarcoidosis diagnosed by open lung biopsy in 1990s COPD asthma overlap Coronary artery disease HTN HLD  Significant Hospital Events: Including procedures, antibiotic start and stop dates in addition to other pertinent events   10/26 admitted to the hospital, started on ceftriaxone  and azithromycin , prednisone  40 mg daily 10/29 completed azithromycin  10/30 stopped ceftriaxone   Interim History / Subjective:  O2 stable 2L remains dyspneic, LE edema is significant.  Objective    Blood pressure (!) 146/58, pulse 81, temperature 97.7 F (36.5 C), temperature source Oral, resp. rate 18, height 5' 11 (1.803 m), weight 98 kg, SpO2 96%.        Intake/Output Summary (Last 24 hours) at 04/15/2024 1312 Last data filed at 04/15/2024 1138 Gross per 24 hour  Intake 480 ml  Output 1450 ml  Net -970 ml   Filed Weights   04/11/24 1221 04/14/24 0628 04/15/24 0231  Weight: 97.7 kg 93.2 kg 98 kg    Examination: General: NAD sitting up in recliner HENT: No icterus Lungs: Normal work of breathing on 2 L nasal cannula Cardiovascular: Warm, 3+ pitting edema LE b/l with weeping of feet Neuro: awake, alert, oriented  RVP negative COVID-negative  BAL notable for neutrophil predominance, otherwise no significant results, cultures negative  Resolved problem list   Assessment and Plan   Community-acquired pneumonia: Patchy infiltrates ground glass on the left, chronic on the right.  BAL with greater than 50% neutrophils.  Concerning for suppurative, bacterial  infection.  Cultures are negative, likely due to preceding antibiotics - Continue current antibiotic regimen, plan 14 days total, can complete oral antibiotics if ready for discharge prior to completing regimen  Acute Hypoxemic respiratory failure:  Related to Pna and fluid overload. --Treatment as above and below  Lower extremity swelling: Likely due to fluid administration this hospitalization.  Possibly prolonging  hypoxemia. -- Continue IV Lasix  80 mg IV twice daily as BP and kidney function allows until euvolemic  Asthma:  --resume home inhalers on discharge --Prednisone  taper to start on discharge: Prednisone  30 mg daily for 7 days, Prednisone  20 mg daily for 7 days, Prednisone  15 mg daily for 7 days, Prednisone  10 mg daily for 7 days, Prednisone  5 mg daily for 7 days, then stop  PCCM will sign off. Ideally will be weaned from O2 prior to d/c. If unable to wean, will need prescription for O2 on discharge and will need assessment of ambulatory O2 needs prior to discharge. Will request OP pulmonary f/u.  Labs   CBC: Recent Labs  Lab 04/10/24 0419 04/11/24 0407 04/12/24 0626 04/12/24 1056 04/14/24 1039  WBC 12.9* 11.7* 12.1* 14.5* 15.5*  HGB 12.2* 12.1* 12.5* 12.1* 12.7*  HCT 38.4* 37.9* 39.4 38.6* 39.9  MCV 98.0 96.7 97.8 97.0 95.7  PLT 131* 127* 161 162 167    Basic Metabolic Panel: Recent Labs  Lab 04/11/24 0407 04/12/24 0840 04/13/24 0647 04/14/24 0409 04/15/24 0538  NA 134* 135 134* 136 137  K 3.2* 3.7 3.8 3.9 3.8  CL 96* 94* 93* 95* 95*  CO2 25 26 27 30 31   GLUCOSE 127* 169* 114* 104* 90  BUN 25* 28* 27* 27* 23  CREATININE 0.94 0.99 0.97 1.01 0.95  CALCIUM  8.0* 8.2* 8.2* 8.4* 8.5*      Critical care time: n/a      Luis JONELLE Beals, MD 04/15/24 1:12 PM [facility]  For contact information, see Amion. If no response to pager, please call PCCM consult pager. After hours, 7PM- 7AM, please call Elink.

## 2024-04-16 ENCOUNTER — Inpatient Hospital Stay (HOSPITAL_COMMUNITY)

## 2024-04-16 DIAGNOSIS — R918 Other nonspecific abnormal finding of lung field: Secondary | ICD-10-CM | POA: Diagnosis not present

## 2024-04-16 DIAGNOSIS — R0602 Shortness of breath: Secondary | ICD-10-CM | POA: Diagnosis not present

## 2024-04-16 DIAGNOSIS — J45909 Unspecified asthma, uncomplicated: Secondary | ICD-10-CM | POA: Diagnosis not present

## 2024-04-16 DIAGNOSIS — J9601 Acute respiratory failure with hypoxia: Secondary | ICD-10-CM | POA: Diagnosis not present

## 2024-04-16 DIAGNOSIS — J189 Pneumonia, unspecified organism: Secondary | ICD-10-CM | POA: Diagnosis not present

## 2024-04-16 LAB — GLUCOSE, CAPILLARY
Glucose-Capillary: 108 mg/dL — ABNORMAL HIGH (ref 70–99)
Glucose-Capillary: 203 mg/dL — ABNORMAL HIGH (ref 70–99)
Glucose-Capillary: 149 mg/dL — ABNORMAL HIGH (ref 70–99)
Glucose-Capillary: 127 mg/dL — ABNORMAL HIGH (ref 70–99)

## 2024-04-16 LAB — BASIC METABOLIC PANEL WITH GFR
Anion gap: 15 (ref 5–15)
BUN: 28 mg/dL — ABNORMAL HIGH (ref 8–23)
CO2: 29 mmol/L (ref 22–32)
Calcium: 8.2 mg/dL — ABNORMAL LOW (ref 8.9–10.3)
Chloride: 94 mmol/L — ABNORMAL LOW (ref 98–111)
Creatinine, Ser: 1.1 mg/dL (ref 0.61–1.24)
GFR, Estimated: >60 mL/min (ref >60)
Glucose, Bld: 110 mg/dL — ABNORMAL HIGH (ref 70–99)
Potassium: 3.8 mmol/L (ref 3.5–5.1)
Sodium: 138 mmol/L (ref 135–145)

## 2024-04-16 LAB — BLOOD GAS, VENOUS
Acid-Base Excess: 7.2 mmol/L — ABNORMAL HIGH (ref 0.0–2.0)
Bicarbonate: 33.1 mmol/L — ABNORMAL HIGH (ref 20.0–28.0)
Drawn by: 64724
O2 Saturation: 30.9 %
Patient temperature: 36.5
pCO2, Ven: 50 mmHg (ref 44–60)
pH, Ven: 7.43 (ref 7.25–7.43)
pO2, Ven: <31 mmHg — CL (ref 32–45)

## 2024-04-16 LAB — CBC
HCT: 38.6 % — ABNORMAL LOW (ref 39.0–52.0)
Hemoglobin: 12.4 g/dL — ABNORMAL LOW (ref 13.0–17.0)
MCH: 30.1 pg (ref 26.0–34.0)
MCHC: 32.1 g/dL (ref 30.0–36.0)
MCV: 93.7 fL (ref 80.0–100.0)
Platelets: 149 K/uL — ABNORMAL LOW (ref 150–400)
RBC: 4.12 MIL/uL — ABNORMAL LOW (ref 4.22–5.81)
RDW: 16.9 % — ABNORMAL HIGH (ref 11.5–15.5)
WBC: 17.9 K/uL — ABNORMAL HIGH (ref 4.0–10.5)
nRBC: 0 % (ref 0.0–0.2)

## 2024-04-16 LAB — BRAIN NATRIURETIC PEPTIDE: B Natriuretic Peptide: 198.8 pg/mL — ABNORMAL HIGH (ref 0.0–100.0)

## 2024-04-16 LAB — CYTOLOGY - NON PAP

## 2024-04-16 LAB — LACTIC ACID, PLASMA: Lactic Acid, Venous: 2.2 mmol/L (ref 0.5–1.9)

## 2024-04-16 MED ORDER — DOCUSATE SODIUM 100 MG PO CAPS
100.0000 mg | ORAL_CAPSULE | Freq: Two times a day (BID) | ORAL | Status: DC
  Administered 2024-04-16 – 2024-05-09 (×35): 100 mg via ORAL
  Filled 2024-04-16 (×42): qty 1

## 2024-04-16 MED ORDER — FUROSEMIDE 10 MG/ML IJ SOLN
80.0000 mg | Freq: Two times a day (BID) | INTRAMUSCULAR | Status: DC
  Administered 2024-04-16 – 2024-04-18 (×5): 80 mg via INTRAVENOUS
  Filled 2024-04-16 (×5): qty 8

## 2024-04-16 NOTE — Plan of Care (Signed)
 After some interventions, patient is on 6L HFNC satting at 95-100%, still dysneic at rest. PRN nebulization given x1. No complaints of pain. Elevated bilateral legs with pillow. Health education reinforced especially on his fluid intake. Wife was at bedside. Needs attended. Call bell within reached. Problem: Education: Goal: Ability to describe self-care measures that may prevent or decrease complications (Diabetes Survival Skills Education) will improve Outcome: Progressing Goal: Individualized Educational Video(s) Outcome: Progressing   Problem: Coping: Goal: Ability to adjust to condition or change in health will improve Outcome: Progressing   Problem: Fluid Volume: Goal: Ability to maintain a balanced intake and output will improve Outcome: Progressing   Problem: Health Behavior/Discharge Planning: Goal: Ability to identify and utilize available resources and services will improve Outcome: Progressing Goal: Ability to manage health-related needs will improve Outcome: Progressing   Problem: Metabolic: Goal: Ability to maintain appropriate glucose levels will improve Outcome: Progressing   Problem: Nutritional: Goal: Maintenance of adequate nutrition will improve Outcome: Progressing Goal: Progress toward achieving an optimal weight will improve Outcome: Progressing   Problem: Skin Integrity: Goal: Risk for impaired skin integrity will decrease Outcome: Progressing   Problem: Tissue Perfusion: Goal: Adequacy of tissue perfusion will improve Outcome: Progressing   Problem: Education: Goal: Knowledge of General Education information will improve Description: Including pain rating scale, medication(s)/side effects and non-pharmacologic comfort measures Outcome: Progressing   Problem: Health Behavior/Discharge Planning: Goal: Ability to manage health-related needs will improve Outcome: Progressing   Problem: Clinical Measurements: Goal: Ability to maintain clinical  measurements within normal limits will improve Outcome: Progressing Goal: Will remain free from infection Outcome: Progressing Goal: Diagnostic test results will improve Outcome: Progressing Goal: Respiratory complications will improve Outcome: Progressing Goal: Cardiovascular complication will be avoided Outcome: Progressing   Problem: Activity: Goal: Risk for activity intolerance will decrease Outcome: Progressing   Problem: Nutrition: Goal: Adequate nutrition will be maintained Outcome: Progressing   Problem: Coping: Goal: Level of anxiety will decrease Outcome: Progressing   Problem: Elimination: Goal: Will not experience complications related to bowel motility Outcome: Progressing Goal: Will not experience complications related to urinary retention Outcome: Progressing   Problem: Pain Managment: Goal: General experience of comfort will improve and/or be controlled Outcome: Progressing   Problem: Safety: Goal: Ability to remain free from injury will improve Outcome: Progressing   Problem: Skin Integrity: Goal: Risk for impaired skin integrity will decrease Outcome: Progressing

## 2024-04-16 NOTE — Plan of Care (Signed)

## 2024-04-16 NOTE — Progress Notes (Signed)
 NAME:  Luis [name], MRN:  [id number], DOB:  Apr 14, 1941, LOS: 9 ADMISSION DATE:  04/07/2024, CONSULTATION DATE:  10/30 REFERRING MD:  [facility], CHIEF COMPLAINT:  acute resp failure   History of Present Illness:  Luis [name] is an 83 y/o gentleman with a history of pulmonary sarcoidosis on chronic steriods, COPD-asthma overlap on triple inhaled therapy & Dupixent , stage IV cecal adenocarcinoma being treated at [facility] since 2019 who presented to the hospital with progressive shortness of breath and dry cough over 4 days on 04/07/2024.  He had been seen in pulmonary clinic today following his symptoms starting on 7/23.  He had an exacerbation of his respiratory disease in March and September of this year.  Since his exacerbation in September he had gone back on prednisone , tapering from 30 mg to 20 mg to 10 mg daily.  When he got back down to 10 mg daily dose he felt his symptoms flared up again, similar to tapers done in the 1990s when he was on chronic steroids for sarcoidosis.  He has only been on intermittent steroids since 1990s; he has not been on steroid sparing DMARD therapy due to quiescent sarcoidosis over the years.  He has not had sick contacts recently.  No new symptoms other than some leg swelling consistent with hospital.  No fevers.  He has had green sputum production in the hospital, but did not have sputum at home.  He is not on home oxygen.  He last had chemotherapy 2 weeks ago-pembrolizumab and 5-fluorouracil .  He felt well after this for a few days before starting to feel run down.  His meds are not to take primary support.  He is on Eliquis for DVT diagnosed in late July 2025 & Afib.  Patient hospital because required 4 to 6 L nasal cannula.  He has been maintained on azithromycin , ceftriaxone , prednisone  40mg  daily. Overall he felt a little better yesterday but is more dyspneic again today.   Pertinent  Medical History  Afib, recent cardioversion Metastatic adenocarcinoma of  cecum Pulmonary sarcoidosis diagnosed by open lung biopsy in 1990s COPD asthma overlap Coronary artery disease HTN HLD  Significant Hospital Events: Including procedures, antibiotic start and stop dates in addition to other pertinent events   10/26 admitted to the hospital, started on ceftriaxone  and azithromycin , prednisone  40 mg daily 10/29 completed azithromycin  10/30 stopped ceftriaxone  11/5 desaturation at 0400 overnight, transition to 4 L/min.  Then up to 10 L/min when he was moved to chair.  Now weaned to 6 L/min  Interim History / Subjective:  11/5 desaturation at 0400 overnight, transition to 4 L/min.  Then up to 10 L/min when he was moved to chair.  Now weaned to 6 L/min    Objective    Blood pressure (!) 142/67, pulse 88, temperature 97.7 F (36.5 C), temperature source Oral, resp. rate (!) 21, height 5' 11 (1.803 m), weight 97.1 kg, SpO2 95%.        Intake/Output Summary (Last 24 hours) at 04/16/2024 1057 Last data filed at 04/16/2024 0536 Gross per 24 hour  Intake 460 ml  Output 3475 ml  Net -3015 ml   Filed Weights   04/14/24 0628 04/15/24 0231 04/16/24 0410  Weight: 93.2 kg 98 kg 97.1 kg    Examination: General: Chronically ill-appearing man sitting up in bed HENT: Pupils equal, oropharynx clear, some hoarse voice Lungs: Normal work of breathing, 5 L/min nasal cannula, no wheeze Cardiovascular: 2-3+ pitting edema bilateral lower extremities (he reports improved) Neuro:  Awake and alert, well-oriented, follows commands, good strength  RVP negative COVID-negative  BAL notable for neutrophil predominance, otherwise no significant results, cultures negative  Resolved problem list   Assessment and Plan   Community-acquired pneumonia: Patchy infiltrates ground glass on the left, chronic on the right.  BAL with greater than 50% neutrophils.  Concerning for suppurative, bacterial infection.  Cultures are negative, likely due to preceding antibiotics -Agree  with plan for 14 days of ceftriaxone  -Push pulmonary hygiene  Multifactorial hypoxemic respiratory failure due to right lower lobe CAP, sarcoidosis with associated asthma, component of volume overload -Increased oxygen need overnight and with exertion this morning.  Suspect that this reflects only partial treatment of his pneumonia.  CXR 11/5 with increased left-sided opacity, suspect atelectasis versus edema - Apparent VBG from this morning > 7.43/50/31/31% - Could consider PE although he is on chronic AC for his history of A-fib/flutter.  If he fails to improve his oxygen needs then could consider a CT-PA to see if he is an apixaban failure - Pulmonary hygiene as above - Antibiotics as above - Continue Breztri  - Prolonged prednisone  taper, hopefully will be able to wean to off completely although he has required intermittent prednisone  dosing even at baseline - Suspect he will need oxygen for discharge at least temporarily as he recovers from this pneumonia  Lower extremity swelling: Likely due to fluid administration this hospitalization.  Possibly prolonging hypoxemia. -Tolerating diuresis, will continue until serum creatinine bumps, clinically euvolemic.  Lasix  80 mg IV twice daily for now, back to his usual home dose at the time of discharge  Asthma:  -Home regimen of Dupixent , Singulair ,, Breztri , albuterol  -Goal prednisone  taper off as an outpatient: Prednisone  taper to start on discharge: Prednisone  30 mg daily for 7 days, Prednisone  20 mg daily for 7 days, Prednisone  15 mg daily for 7 days, Prednisone  10 mg daily for 7 days, Prednisone  5 mg daily for 7 days, then stop   Labs   CBC: Recent Labs  Lab 04/11/24 0407 04/12/24 0626 04/12/24 1056 04/14/24 1039 04/16/24 1013  WBC 11.7* 12.1* 14.5* 15.5* 17.9*  HGB 12.1* 12.5* 12.1* 12.7* 12.4*  HCT 37.9* 39.4 38.6* 39.9 38.6*  MCV 96.7 97.8 97.0 95.7 93.7  PLT 127* 161 162 167 149*    Basic Metabolic Panel: Recent Labs  Lab  04/12/24 0840 04/13/24 0647 04/14/24 0409 04/15/24 0538 04/16/24 0504  NA 135 134* 136 137 138  K 3.7 3.8 3.9 3.8 3.8  CL 94* 93* 95* 95* 94*  CO2 26 27 30 31 29   GLUCOSE 169* 114* 104* 90 110*  BUN 28* 27* 27* 23 28*  CREATININE 0.99 0.97 1.01 0.95 1.10  CALCIUM  8.2* 8.2* 8.4* 8.5* 8.2*      Critical care time: n/a    Lamar Chris, MD, PhD 04/16/2024, 10:57 AM [facility] [phone] or if no answer before 7:00PM call [phone] For any issues after 7:00PM please call eLink [phone]

## 2024-04-16 NOTE — Care Plan (Signed)
 Patient is consistently satting to 80's upon entering the room, dyspneic at rest. On 5lpm via [place]. Called RT and hooked to Nonrebreather mask. CN made aware. Notified provider with STAT orders. Patient is currently on 96% @ 15lpm.

## 2024-04-16 NOTE — Progress Notes (Signed)
 Pt also expressed increased feeling of not being well, heaviness in his lungs and fatigue.

## 2024-04-16 NOTE — Progress Notes (Signed)
 Pt SATS were in the low 80's when I arrived to the room to administer meds. Gave a breathing treatment and assessed lungs. Bumped O2 up to 4L, at that time Pt still wasn't maintaining so he was increased to 5L and respiratory was called to bedside to evaluate. Jayleca, RN was present and aware of the current status.

## 2024-04-16 NOTE — Progress Notes (Signed)
 OT Cancellation Note  Patient Details Name: ANCHOR DWAN MRN: [id number] DOB: 12/10/1940   Cancelled Treatment:    Reason Eval/Treat Not Completed: Other (comment) Pt politely declining session this date, requesting OT to return tomorrow (11/6). OT to continue to follow Pt as appropriate and schedule allows.   Maurilio CROME, OTR/LSABRA  [facility]  Office: [phone]   Maurilio PARAS Twania Bujak 04/16/2024, 4:53 PM

## 2024-04-16 NOTE — Progress Notes (Signed)
 PROGRESS NOTE Luis [name]  FMW:[id number] DOB: [birth year] DOA: 04/07/2024 PCP: Luis Charlene CROME, MD  Brief Narrative/Hospital Course: Luis [name] is a 83 y.o. male with PMH of  metastatic colon cancer, mets to lungs, liver, LN's, sarcoidosis, COPD, CAD hx PCI, HFpEF, hypertension, hyperlipidemia, hypothyroidism, who presented with progressive shortness of breath onset x 1 wk  with  exertional dyspnea, chest congestion, minimally productive cough with clear and scant blood-tinged sputum, seen by pulmonology and was started on Augmentin  on Tuesday but by Thursday his symptoms started worsening and progressively worsened over the weekend.  He ended up checking his pulse oximeter at home which was reading in the high 60s so he presented to the ED  In the ED tachypneic, hypoxic,labs-mag low 1.5 hyperglycemic 291 lactic acidosis 3.3 Pro-Cal 1.5 leukocytosis 14.5, A1c 7.1, high sensitive troponin 29 proBNP 249 EKG sinus rhythm LAD LVH prolonged Qtc, situational up to 75% room air given 4 L nasal cannula Chest x-ray>> right lung base increased patchy airspace opacities and new left infrahilar airspace opacities concerning for multifocal pneumonia small right pleural effusion CT chest>> Postsurgical and radiation changes in the right lower lobe. Multifocal ground glass opacity with mosaic attenuation in the left lung, favoring mild multifocal infection/pneumonia, although pneumonitis related to immunotherapy is also possible.  Stable underlying sequela of sarcoidosis. Patient admitted for acute hypoxic respiratory failure with concern for multifocal pneumonia in the setting of metastatic colon cancer Home Eliquis continued, Lasix  trial given along with increased dose of prednisone  without significant improvement 10/3o Pulm consulted as patient dyspnea worsened on 10/30 10/31 s/p bronchoscopy,BAL sent for studies.culture-AFB smear negative culture no growth-PJP/fungal culture pending Patient continued on  IV diuretics pulmonary following  Subjective: Seen and examined Very short of breath after getting up to void this morning>hypoxic in 80s on 5 L needing nonrebreather Pulmonary notified repeat chest x-ray Subsequently weaned down to HFNC at 6 L, resting well on bed denies nausea vomiting chest pain currently. He feels leg edema has much improved although they are still 3+ pitting edema Has been drinking 5-6 Of 10 oz cup daily  added fluid restriction Overnight afebrile BP stable, labs shows stable renal function wt on admission 219>212>215> 205>216?214l lb overall improving some.  Assessment and plan:  Acute respiratory failure with hypoxia  New left lung patchy GGO concerning for acute inflammation or infection-multifocal pneumonia versus pneumonitis Immunocompromise status in the setting of chemotherapy History of sarcoidosis on long-term steroid: CT chest:Multifocal GGO/mosaic attenuation  LL- suspect multifocal infection/pneumonia-? pneumonitis related to immunotherapy Reports being on steroid for last 2 weeks 20 mg of prednisone  here needing 4 L [place] ON Eliquis PTA-less likelihood of PE. TTE done- EF 70 to 75% G1 DD RV SF is normal RV size is normal. Blood culture NGTD Patient will continue on IV antibiotics aggressive bronchodilators Noticed to have ongoing leg edema, BNP 237-Home p.o. diuretics changed to IV Initially felt some better again had worsening dyspnea 10/30 still needing 4 L nasal cannula>PCCM consulted  S/P bronchoscopy BAL 10/31 - BAL culture-no growth to date AFB fungus PJP DFA pending Lasix  increased to 80 mg Q6 on 11/3> now at q12hr per PCCM until euvolemic. Continue ceftriaxone , doxycycline  x 14 days, continue prednisone  40 mg x 7 days then 20 x 7 days, 15 x 7 days, 10 x 7 days More hypoxic needing increased oxygen requirement this morning vBG chest x-ray ordered pulmonary notified -PCCM will follow today  chest x-ray>Increased left lung opacities, concerning for  worsening pneumonia or  asymmetric edema. with somewhat worsening await pulmonary recommendation currently doing well on HFNC at 6 L..  Mild lactic acidosis due to respiratory distress this morning Vital stable respiratory status improved  Acute on chronic HFpEF Chronic leg edema: Cont iv lasix  aggressively.  weight a week ago was 216 pound He has been intentionally losing weight before that he was 240.Weight at 219 pound on admission now 212>215> 205> 216, with ongoing leg edema Lasix  increased 11/3 Cont to monitor daily I/O,weight, electrolytes and net balance as below.Keep on  salt/fluid restricted diet and monitor in tele. Net IO Since Admission: -7,375 mL [04/16/24 1048]  Filed Weights   04/14/24 0628 04/15/24 0231 04/16/24 0410  Weight: 93.2 kg 98 kg 97.1 kg    Recent Labs  Lab 04/12/24 0840 04/13/24 0647 04/14/24 0409 04/15/24 0538 04/16/24 0504 04/16/24 0857  BNP  --   --   --   --   --  198.8*  BUN 28* 27* 27* 23 28*  --   CREATININE 0.99 0.97 1.01 0.95 1.10  --   K 3.7 3.8 3.9 3.8 3.8  --    Elevated troponin: suspect demand ischemia with flat troponin. TTE is unremarkable  Hypokalemia Hypomagnesemia: Resolved  Lactic acidosis: Likely in the setting of respiratory failure-volume depletion hyperglycemia.   New onset type 2 diabetes with uncontrolled hyperglycemia: A1c at 7.1.  Blood sugar up to 400-diet changed to carb controlled diet,started on glipizide and metformin ssi Blood sugar fairly controlled.  Suspect in the setting of his steroid use as well Recent Labs  Lab 04/15/24 0811 04/15/24 1152 04/15/24 1659 04/15/24 2118 04/16/24 0831  GLUCAP 115* 134* 142* 187* 108*    Metastatic colon cancer, R hemicolectomy, mets to lungs, liver, LN's: Followed by [facility], has known RUL nodule and opacities in the R base ? Lymphangitic spread v. Inflammation/infection (last in 6/'25).He is currently on maintenance 5FU and Panitumumab  every 2  weeks.  Sarcoidosis: Follows with pulmonology, appears that he is currently on chronic steroids   pHTN: Suggested on imaging, noted   HTN HLD CAD hx PCI: Bp stable, no chest pain. Cont his DOAC, rosuvastatin , zetia , Ranolazine    Aflutter: Rate controlled. Cont home Apixaban, Dronedarone    Aortic ectasia: OP surveillance   Hypothyroidism: Continue home levothyroxine    Chronic anemia:  Stable at baseline.B12 and folate normal.  Thrombocytopenia: Monitor platelet count, question if from chemo  ?Chronic pain: Continue home Gabapentin    ?Rosacea: continue home Minocycline.   Class  I Obesity w/ Body mass index is 29.86 kg/m.: will benefit with PCP follow-up, weight loss,healthy lifestyle and outpatient sleep eval if not done.  PT Orders: Active PT Follow up Rec: Outpatient Pt (Pulmonary Rehab)04/15/2024 1600    DVT prophylaxis: Eliquis Code Status:   Code Status: Prior Family Communication: plan of care discussed with patient at bedside. Patient status is: Remains hospitalized because of severity of illness Level of care: Telemetry Medical   Dispo: The patient is from: home.            Anticipated disposition: TBD. Objective: Vitals last 24 hrs: Vitals:   04/16/24 0536 04/16/24 0830 04/16/24 0905 04/16/24 0915  BP: 128/60 (!) 142/67    Pulse: 96 88    Resp:  16  (!) 21  Temp: 98 F (36.7 C)  97.7 F (36.5 C)   TempSrc: Oral  Oral   SpO2: 96% 94%  95%  Weight:      Height:       Physical Examination:  General exam:AAOX3 pleasant, NAD HEENT:Oral mucosa moist, Ear/Nose WNL grossly Respiratory system: B/L diminished use of accessory muscle Cardiovascular system: S1 & S2 +, No JVD. Gastrointestinal system: Abdomen soft,NT,ND, BS+ Nervous System: Alert, awake, moving all extremities,and following commands. Extremities: extremities warm, leg edema  presentSkin: Warm, no rashes MSK: Normal muscle bulk,tone, power.   Medications reviewed:  Scheduled Meds:   apixaban  5 mg Oral BID   budesonide -glycopyrrolate-formoterol  2 puff Inhalation BID   doxycycline   100 mg Oral Q12H   dronedarone   400 mg Oral BID WC   ezetimibe   10 mg Oral Daily   furosemide   80 mg Intravenous BID   gabapentin   200 mg Oral QHS   glipiZIDE  2.5 mg Oral QAC breakfast   guaiFENesin   600 mg Oral BID   insulin aspart  0-15 Units Subcutaneous TID WC   levothyroxine   50 mcg Oral Q0600   metFORMIN  500 mg Oral BID WC   [START ON 04/21/2024] minocycline  100 mg Oral Daily   montelukast   10 mg Oral QHS   predniSONE   30 mg Oral Q breakfast   rosuvastatin   20 mg Oral Daily   Continuous Infusions:  cefTRIAXone  (ROCEPHIN )  IV 2 g (04/15/24 1713)    Diet: Diet Order             Diet Carb Modified Fluid consistency: Thin; Room service appropriate? Yes; Fluid restriction: 1200 mL Fluid  Diet effective now                    Data Reviewed: I have personally reviewed following labs and imaging studies ( see epic result tab) CBC: Recent Labs  Lab 04/11/24 0407 04/12/24 0626 04/12/24 1056 04/14/24 1039 04/16/24 1013  WBC 11.7* 12.1* 14.5* 15.5* 17.9*  HGB 12.1* 12.5* 12.1* 12.7* 12.4*  HCT 37.9* 39.4 38.6* 39.9 38.6*  MCV 96.7 97.8 97.0 95.7 93.7  PLT 127* 161 162 167 149*   CMP: Recent Labs  Lab 04/12/24 0840 04/13/24 0647 04/14/24 0409 04/15/24 0538 04/16/24 0504  NA 135 134* 136 137 138  K 3.7 3.8 3.9 3.8 3.8  CL 94* 93* 95* 95* 94*  CO2 26 27 30 31 29   GLUCOSE 169* 114* 104* 90 110*  BUN 28* 27* 27* 23 28*  CREATININE 0.99 0.97 1.01 0.95 1.10  CALCIUM  8.2* 8.2* 8.4* 8.5* 8.2*   GFR: Estimated Creatinine Clearance: 60.5 mL/min (by C-G formula based on SCr of 1.1 mg/dL). No results for input(s): AST, ALT, ALKPHOS, BILITOT, PROT, ALBUMIN in the last 168 hours. No results for input(s): LIPASE, AMYLASE in the last 168 hours. No results for input(s): AMMONIA in the last 168 hours. Coagulation Profile:  No results for input(s): INR,  PROTIME in the last 168 hours.  Unresulted Labs (From admission, onward)     Start     Ordered   04/13/24 0500  Basic metabolic panel with GFR  Daily,   R      04/12/24 1038   04/12/24 1039  CBC  Every 48 hours,   R      04/12/24 1038   04/11/24 1312  KOH prep  RELEASE UPON ORDERING,   TIMED       Comments: Specimen A: Specimen Description LUL BAL Phone [phone] Immunocompromised?  No  Antibiotic Treatment:  none Is the patient on airborne/droplet precautions? No Clinical History:  N/A Special Instructions:  none Specimen Disposition:  Microbiology     04/11/24 1312   04/11/24 1312  Pneumocystis smear by DFA  RELEASE UPON ORDERING,   TIMED       Comments: Specimen A: Specimen Description LUL BAL Phone [phone] Immunocompromised?  No  Antibiotic Treatment:  none Is the patient on airborne/droplet precautions? No Clinical History:  N/A Special Instructions:  none Specimen Disposition:  Microbiology     04/11/24 1312   04/11/24 1312  Acid Fast Culture with reflexed sensitivities  RELEASE UPON ORDERING,   TIMED       Comments: Specimen A: Specimen Description LUL BAL Phone [phone] Immunocompromised?  No  Antibiotic Treatment:  none Is the patient on airborne/droplet precautions? No Clinical History:  N/A Special Instructions:  none Specimen Disposition:  Microbiology     04/11/24 1312           Antimicrobials/Microbiology: Anti-infectives (From admission, onward)    Start     Dose/Rate Route Frequency Ordered Stop   04/21/24 1000  minocycline (MINOCIN) capsule 100 mg        100 mg Oral Daily 04/15/24 1245     04/13/24 2200  doxycycline  (VIBRA -TABS) tablet 100 mg        100 mg Oral Every 12 hours 04/13/24 1241 04/20/24 2359   04/12/24 1800  cefTRIAXone  (ROCEPHIN ) 2 g in sodium chloride  0.9 % 100 mL IVPB        2 g 200 mL/hr over 30 Minutes Intravenous Every 24 hours 04/12/24 1655 04/20/24 2359   04/12/24 1800  doxycycline  (VIBRAMYCIN ) 100 mg in sodium  chloride 0.9 % 250 mL IVPB  Status:  Discontinued        100 mg 125 mL/hr over 120 Minutes Intravenous Every 12 hours 04/12/24 1655 04/13/24 1240   04/08/24 1000  azithromycin  (ZITHROMAX ) tablet 500 mg        500 mg Oral Daily 04/07/24 0238 04/09/24 0852   04/08/24 0000  cefTRIAXone  (ROCEPHIN ) 1 g in sodium chloride  0.9 % 100 mL IVPB        1 g 200 mL/hr over 30 Minutes Intravenous Every 24 hours 04/07/24 0238 04/11/24 0815   04/07/24 1000  minocycline (MINOCIN) capsule 100 mg  Status:  Discontinued        100 mg Oral Daily 04/07/24 0241 04/15/24 1204   04/07/24 0130  cefTRIAXone  (ROCEPHIN ) 2 g in sodium chloride  0.9 % 100 mL IVPB        2 g 200 mL/hr over 30 Minutes Intravenous  Once 04/07/24 0124 04/07/24 0255   04/07/24 0130  azithromycin  (ZITHROMAX ) 500 mg in sodium chloride  0.9 % 250 mL IVPB        500 mg 250 mL/hr over 60 Minutes Intravenous  Once 04/07/24 0124 04/07/24 0337         Component Value Date/Time   SDES BRONCHIAL ALVEOLAR LAVAGE 04/11/2024 1307   SPECREQUEST LUL 04/11/2024 1307   CULT  04/11/2024 1307    NO GROWTH Performed at [facility], [address], [place], [name] [address]    REPTSTATUS 04/13/2024 FINAL 04/11/2024 1307    Procedures: Procedure(s) (LRB): VIDEO BRONCHOSCOPY WITHOUT FLUORO (N/A) Mennie LAMY, MD [facility] 04/16/2024, 10:48 AM

## 2024-04-17 ENCOUNTER — Inpatient Hospital Stay (HOSPITAL_COMMUNITY)

## 2024-04-17 DIAGNOSIS — J9601 Acute respiratory failure with hypoxia: Secondary | ICD-10-CM | POA: Diagnosis not present

## 2024-04-17 DIAGNOSIS — J45909 Unspecified asthma, uncomplicated: Secondary | ICD-10-CM | POA: Diagnosis not present

## 2024-04-17 DIAGNOSIS — J189 Pneumonia, unspecified organism: Secondary | ICD-10-CM | POA: Diagnosis not present

## 2024-04-17 LAB — BASIC METABOLIC PANEL WITH GFR
Anion gap: 14 (ref 5–15)
BUN: 27 mg/dL — ABNORMAL HIGH (ref 8–23)
CO2: 32 mmol/L (ref 22–32)
Calcium: 7.9 mg/dL — ABNORMAL LOW (ref 8.9–10.3)
Chloride: 93 mmol/L — ABNORMAL LOW (ref 98–111)
Creatinine, Ser: 1.23 mg/dL (ref 0.61–1.24)
GFR, Estimated: 58 mL/min — ABNORMAL LOW (ref >60)
Glucose, Bld: 86 mg/dL (ref 70–99)
Potassium: 3.1 mmol/L — ABNORMAL LOW (ref 3.5–5.1)
Sodium: 139 mmol/L (ref 135–145)

## 2024-04-17 LAB — GLUCOSE, CAPILLARY
Glucose-Capillary: 81 mg/dL (ref 70–99)
Glucose-Capillary: 190 mg/dL — ABNORMAL HIGH (ref 70–99)
Glucose-Capillary: 105 mg/dL — ABNORMAL HIGH (ref 70–99)
Glucose-Capillary: 186 mg/dL — ABNORMAL HIGH (ref 70–99)

## 2024-04-17 LAB — LACTIC ACID, PLASMA: Lactic Acid, Venous: 3 mmol/L (ref 0.5–1.9)

## 2024-04-17 MED ORDER — IOHEXOL 350 MG/ML SOLN
75.0000 mL | Freq: Once | INTRAVENOUS | Status: AC | PRN
  Administered 2024-04-17: 75 mL via INTRAVENOUS

## 2024-04-17 MED ORDER — SODIUM CHLORIDE 0.9% FLUSH
10.0000 mL | INTRAVENOUS | Status: DC | PRN
  Administered 2024-05-09: 10 mL

## 2024-04-17 MED ORDER — POTASSIUM CHLORIDE CRYS ER 20 MEQ PO TBCR
40.0000 meq | EXTENDED_RELEASE_TABLET | ORAL | Status: AC
  Administered 2024-04-17 (×2): 40 meq via ORAL
  Filled 2024-04-17 (×2): qty 2

## 2024-04-17 MED ORDER — SODIUM CHLORIDE 0.9% FLUSH
10.0000 mL | Freq: Two times a day (BID) | INTRAVENOUS | Status: DC
  Administered 2024-04-18 – 2024-04-24 (×13): 10 mL
  Administered 2024-04-25: 20 mL
  Administered 2024-04-25 – 2024-05-02 (×13): 10 mL
  Administered 2024-05-02: 20 mL
  Administered 2024-05-03 – 2024-05-06 (×8): 10 mL
  Administered 2024-05-07: 20 mL
  Administered 2024-05-07 – 2024-05-10 (×5): 10 mL

## 2024-04-17 MED ORDER — CHLORHEXIDINE GLUCONATE CLOTH 2 % EX PADS
6.0000 | MEDICATED_PAD | Freq: Every day | CUTANEOUS | Status: DC
  Administered 2024-04-18 – 2024-05-10 (×22): 6 via TOPICAL

## 2024-04-17 NOTE — Progress Notes (Signed)
 Patient's HFNC weaned down to 4L with O2 sats remaining in mid and upper 90's.

## 2024-04-17 NOTE — Anesthesia Postprocedure Evaluation (Signed)
 Anesthesia Post Note  Patient: Luis [name]  Procedure(s) Performed: VIDEO BRONCHOSCOPY WITHOUT FLUORO     Patient location during evaluation: PACU Anesthesia Type: General Level of consciousness: awake and alert Pain management: pain level controlled Vital Signs Assessment: post-procedure vital signs reviewed and stable Respiratory status: spontaneous breathing, nonlabored ventilation and respiratory function stable Cardiovascular status: blood pressure returned to baseline and stable Postop Assessment: no apparent nausea or vomiting Anesthetic complications: no   There were no known notable events for this encounter.               Mathew Storck

## 2024-04-17 NOTE — Progress Notes (Signed)
 Physical Therapy Treatment Patient Details Name: Luis [name] MRN: [id number] DOB: [birth year] Today's Date: 04/17/2024   History of Present Illness 83 y.o. male presents to [facility] hospital on 04/07/2024 with progressive SOB. Pt underwent bronchoscopy with biopsies 10/31. PMH includes metastatic colon CA with mets to lungs, liver, and lymph nodes, sarcoidosis, COPD, CAD, HFpEF, HTN, HLD, hypothyroidism.    PT Comments  Pt seated up EOB on arrival, agreeable to session and demonstrating great motivation for progress. Pt continues to be limited by decreased cardiopulmonary endurance with increased supplemental O2 demands with pt needing increased time and seated rest to maintain SpO2 >90%. Pt demonstrating transfers and in room ambulation with rollator support with grossly supervision for safety. Pt greeted on 9L O2 with SpO2 88-93% at rest, pt requesting to attempt ambulation on 8L with SpO2 decreaseing to 79%, rebounding to 92% with seated rest and increse to 10L (tank without option for 9L). SpO2 then dopping into low 80s with ambulation on 10L, rebounding with cues for pursed lip breathing and seated rest to >92%, replaced 9L at end of session with SpO2 94% at rest. Pt demonstrating proper IS use with, pulling up to , encouraged use x10/hr. Pt continues to benefit from skilled PT services to progress toward functional mobility goals.     If plan is discharge home, recommend the following: Assistance with cooking/housework;Assist for transportation;Help with stairs or ramp for entrance   Can travel by private vehicle        Equipment Recommendations  None recommended by PT    Recommendations for Other Services       Precautions / Restrictions Precautions Precautions: Fall Recall of Precautions/Restrictions: Intact Precaution/Restrictions Comments: Monitor SpO2 Restrictions Weight Bearing Restrictions Per Provider Order: No     Mobility  Bed Mobility Overal bed mobility: Needs  Assistance (patient sitting EOB upon arrival into room)             General bed mobility comments: pt sitting up EOB    Transfers Overall transfer level: Needs assistance Equipment used: Rolling walker (2 wheels) Transfers: Sit to/from Stand Sit to Stand: Supervision           General transfer comment: supervision for safety, standing from EOB x1 and rollator seat x2    Ambulation/Gait Ambulation/Gait assistance: Supervision Gait Distance (Feet): 15 Feet (+ 50') Assistive device: Rollator (4 wheels) Gait Pattern/deviations: Step-through pattern, Decreased stride length Gait velocity: reduced     General Gait Details: pt ambulating throughout room with rollator support without LOB, limited by DOE and SpO2 decreasing to low 80s on 10L with activity, rebounding with seated rest to>92%   Stairs             Wheelchair Mobility     Tilt Bed    Modified Rankin (Stroke Patients Only)       Balance Overall balance assessment: Needs assistance Sitting-balance support: Single extremity supported, Feet supported Sitting balance-Leahy Scale: Good Sitting balance - Comments: Pt with one UE propping while seated in recliner. Required posterior support   Standing balance support: Bilateral upper extremity supported, During functional activity, Reliant on assistive device for balance Standing balance-Leahy Scale: Poor Standing balance comment: Pt dependent on AD                            Communication Communication Communication: No apparent difficulties  Cognition Arousal: Alert Behavior During Therapy: WFL for tasks assessed/performed   PT - Cognitive impairments: No apparent  impairments                         Following commands: Intact      Cueing Cueing Techniques: Verbal cues  Exercises Other Exercises Other Exercises: pt demonstrating IS use x8, pulling up to    General Comments General comments (skin integrity,  edema, etc.): pt greeted on 9L O2 with SpO2 88-93% at rest, pt requesting to attempt ambulation at 8L with SpO2 decreaseing to 79%, rebounding to 92% with increse to 10L (tank without option for 9L). SpO2 then dopping into low 80s with ambualtion, rebounding with cues for pursed lip breathing and seated rest to >92%, replaced 9L at end of session with SpO2 94% at rest      Pertinent Vitals/Pain Pain Assessment Pain Assessment: No/denies pain Pain Intervention(s): Monitored during session    Home Living                          Prior Function            PT Goals (current goals can now be found in the care plan section) Acute Rehab PT Goals Patient Stated Goal: Improve activity tolerance and not need oxygen PT Goal Formulation: With patient/family Time For Goal Achievement: 04/22/24 Progress towards PT goals: Progressing toward goals    Frequency    Min 2X/week      PT Plan      Co-evaluation              AM-PAC PT 6 Clicks Mobility   Outcome Measure  Help needed turning from your back to your side while in a flat bed without using bedrails?: A Little Help needed moving from lying on your back to sitting on the side of a flat bed without using bedrails?: A Little Help needed moving to and from a bed to a chair (including a wheelchair)?: A Little Help needed standing up from a chair using your arms (e.g., wheelchair or bedside chair)?: A Little Help needed to walk in hospital room?: A Little Help needed climbing 3-5 steps with a railing? : A Little 6 Click Score: 18    End of Session Equipment Utilized During Treatment: Oxygen Activity Tolerance: Patient tolerated treatment well;Patient limited by fatigue Patient left: with call bell/phone within reach;in bed;with family/visitor present (seated up EOB) Nurse Communication: Mobility status PT Visit Diagnosis: Other abnormalities of gait and mobility (R26.89)     Time: 9042-8977 PT Time Calculation  (min) (ACUTE ONLY): 25 min  Charges:    $Therapeutic Activity: 23-37 mins PT General Charges $$ ACUTE PT VISIT: 1 Visit                     Therisa R. PTA Acute Rehabilitation Services Office: [phone]   Therisa CHRISTELLA Boor 04/17/2024, 10:36 AM

## 2024-04-17 NOTE — Progress Notes (Signed)
 Patient arrived from 6N to 4E13 A&O x4 and INAD.  Patient noted to be on 9L HFNC; O2 sat 99-100%.  Telemetry monitor applied and CCMD noted.  CHG bath and skin assessment completed.  Patient oriented to unit and room to include call light and phone.  O2 weaned down to 7L with sats remaining 100%.

## 2024-04-17 NOTE — Progress Notes (Signed)
 Patient had BRBPR with formed BM in [facility] with blood clots noted.  Patient states he has internal hemorrhoids and occasionally has blood with his BM's that is self-limiting.

## 2024-04-17 NOTE — Progress Notes (Signed)
 HFNC weaned down to 5L with sats remaining 99-100%.

## 2024-04-17 NOTE — Progress Notes (Signed)
   NAME:  Luis [name], MRN:  [id number], DOB:  10-Nov-1940, LOS: 10 ADMISSION DATE:  04/07/2024, CONSULTATION DATE:  10/30 REFERRING MD:  KC-TR[facility], CHIEF COMPLAINT:  acute resp failure   Attending Note, please refer also to daily progress note from P. Rosan, NP:  I have examined patient, reviewed labs, studies and notes.   Interval events: - Continues to be dyspneic, probably slightly more dyspneic than 11/5 - Chest x-ray from 11/5 with slight increase in left-sided interstitial opacity - I/O - 8.3 L total -Lactic acid 3.0, unclear significance - Potassium 3.1  Vitals:   04/17/24 0900 04/17/24 0923 04/17/24 1257 04/17/24 1258  BP:    115/68  Pulse:    93  Resp: (!) 22  20 20   Temp:    97.6 F (36.4 C)  TempSrc:    Oral  SpO2: 93% 93%  98%  Weight:      Height:      Sitting up in bedside.  Comfortable at rest but does have some dyspnea and some relative desaturations with conversation.  Coarse bilateral breath sounds, distant.  Heart regular, distant without a murmur.  Abdomen benign.  He has 2+ lower extremity pitting edema   - Right lower lobe and probably left upper lobe CAP, BAL culture data negative but cell count consistent with bacterial process. - Acute exacerbation of diastolic CHF, diuresing well - Sarcoidosis with parenchymal lung disease and associated asthma - Multifactorial hypoxemic respiratory failure due to all of the above with an increasing oxygen requirement.  Left-sided infiltrates appear more prominent on chest x-ray from 11/5.    Plans: -Feel that we should characterize with a repeat CT scan of the chest.  Also at risk for pulmonary embolism although he is on anticoagulation, could be an [facility] failure given his history of malignancy.  We will order CT-PA. - Continue antibiotics - Continue corticosteroids and bronchodilators - He is due for his Dupixent , unclear whether he can get this as an inpatient but may be beneficial discussed with pharmacy - Diuresis  as he can tolerate - Wean oxygen for SpO2 90%   Lamar Chris, MD, PhD 04/17/2024, 1:35 PM [facility] [phone] or if no answer [phone]

## 2024-04-17 NOTE — Progress Notes (Signed)
 NAME:  Luis [name], MRN:  [id number], DOB:  12/11/1940, LOS: 10 ADMISSION DATE:  04/07/2024, CONSULTATION DATE:  10/30 REFERRING MD:  KC-TRH, CHIEF COMPLAINT:  acute resp failure   History of Present Illness:  Luis [name] is an 83 y/o gentleman with a history of pulmonary sarcoidosis on chronic steriods, COPD-asthma overlap on triple inhaled therapy & Dupixent , stage IV cecal adenocarcinoma being treated at [facility] since 2019 who presented to the hospital with progressive shortness of breath and dry cough over 4 days on 04/07/2024.  He had been seen in pulmonary clinic today following his symptoms starting on 7/23.  He had an exacerbation of his respiratory disease in March and September of this year.  Since his exacerbation in September he had gone back on prednisone , tapering from 30 mg to 20 mg to 10 mg daily.  When he got back down to 10 mg daily dose he felt his symptoms flared up again, similar to tapers done in the 1990s when he was on chronic steroids for sarcoidosis.  He has only been on intermittent steroids since 1990s; he has not been on steroid sparing DMARD therapy due to quiescent sarcoidosis over the years.  He has not had sick contacts recently.  No new symptoms other than some leg swelling consistent with hospital.  No fevers.  He has had green sputum production in the hospital, but did not have sputum at home.  He is not on home oxygen.  He last had chemotherapy 2 weeks ago-pembrolizumab and 5-fluorouracil .  He felt well after this for a few days before starting to feel run down.  His meds are not to take primary support.  He is on Eliquis for DVT diagnosed in late July 2025 & Afib.  Patient hospital because required 4 to 6 L nasal cannula.  He has been maintained on azithromycin , ceftriaxone , prednisone  40mg  daily. Overall he felt a little better yesterday but is more dyspneic again today.   Pertinent  Medical History  Afib, recent cardioversion Metastatic adenocarcinoma of  cecum Pulmonary sarcoidosis diagnosed by open lung biopsy in 1990s COPD asthma overlap Coronary artery disease HTN HLD  Significant Hospital Events: Including procedures, antibiotic start and stop dates in addition to other pertinent events   10/26 admitted to the hospital, started on ceftriaxone  and azithromycin , prednisone  40 mg daily 10/29 completed azithromycin  10/30 stopped ceftriaxone  11/5 desaturation at 0400 overnight, transition to 4 L/min.  Then up to 10 L/min when he was moved to chair.  Now weaned to 6 L/min  Interim History / Subjective:  More SOB this morning.  Required 10L [place] for ambulation. Has been weaned to 9L now at rest. Feels like he has mucous he is unable to clear and cannot take a deep breath like he usually can.  Satting 100%, but does desat to 90-92% when talking.   Objective    Blood pressure (!) 94/59, pulse 87, temperature (!) 97.4 F (36.3 C), temperature source Oral, resp. rate (!) 22, height 5' 11 (1.803 m), weight 97.1 kg, SpO2 93%.        Intake/Output Summary (Last 24 hours) at 04/17/2024 1234 Last data filed at 04/17/2024 0833 Gross per 24 hour  Intake 600 ml  Output 1200 ml  Net -600 ml   Filed Weights   04/14/24 0628 04/15/24 0231 04/16/24 0410  Weight: 93.2 kg 98 kg 97.1 kg    Examination: General: chronically ill appearing male resting comfortably in bed.  HENT: PERRL, hoarse voice, no JVD Lungs:  Unlabored. 9L [place]. Desats when talking. Cardiovascular: 2+ pitting edema bilateral lower extremities (he reports improved) Neuro: Alert, oriented, non-focal, good strength.   RVP negative COVID-negative  BAL notable for neutrophil predominance, otherwise no significant results, cultures negative Fungus cx pending.  Resolved problem list   Assessment and Plan   Community-acquired pneumonia: Patchy infiltrates ground glass on the left, chronic on the right.  BAL with greater than 50% neutrophils.  Concerning for suppurative,  bacterial infection.  Cultures are negative, likely due to preceding antibiotics -Agree with plan for 14 days of ceftriaxone  -Encouraged mobility and IS use -Will provide flutter valve  Multifactorial hypoxemic respiratory failure due to right lower lobe CAP, sarcoidosis with associated asthma, component of volume overload - Increased oxygen requirement this morning.  - CT is over one week old and interval Xrays looking worse, particularly on the left. Will get CTA chest. - Antibiotics as above - Continue Breztri  - Prolonged prednisone  taper, hopefully will be able to wean to off completely although he has required intermittent prednisone  dosing even at baseline. See below.  - Suspect he will need oxygen for discharge at least temporarily as he recovers from this pneumonia  Lower extremity swelling: Likely due to fluid administration this hospitalization.  Possibly prolonging hypoxemia. - Continued diuresis. Lasix  80mg  BID. Monitor renal function closely.   Asthma:  -Home regimen of Dupixent , Singulair , Breztri , albuterol  -Goal prednisone  taper off as an outpatient: Prednisone  taper to start on discharge: Prednisone  30 mg daily for 7 days, Prednisone  20 mg daily for 7 days, Prednisone  15 mg daily for 7 days, Prednisone  10 mg daily for 7 days, Prednisone  5 mg daily for 7 days, then stop   Labs   CBC: Recent Labs  Lab 04/11/24 0407 04/12/24 0626 04/12/24 1056 04/14/24 1039 04/16/24 1013  WBC 11.7* 12.1* 14.5* 15.5* 17.9*  HGB 12.1* 12.5* 12.1* 12.7* 12.4*  HCT 37.9* 39.4 38.6* 39.9 38.6*  MCV 96.7 97.8 97.0 95.7 93.7  PLT 127* 161 162 167 149*    Basic Metabolic Panel: Recent Labs  Lab 04/13/24 0647 04/14/24 0409 04/15/24 0538 04/16/24 0504 04/17/24 0430  NA 134* 136 137 138 139  K 3.8 3.9 3.8 3.8 3.1*  CL 93* 95* 95* 94* 93*  CO2 27 30 31 29  32  GLUCOSE 114* 104* 90 110* 86  BUN 27* 27* 23 28* 27*  CREATININE 0.97 1.01 0.95 1.10 1.23  CALCIUM  8.2* 8.4* 8.5* 8.2*  7.9*      Critical care time: n/a     Deward [place], AGACNP-BC [facility]  See Amion for personal pager PCCM on call pager [phone] until 7pm. Please call Elink 7p-7a. [phone]  04/17/2024 12:48 PM

## 2024-04-17 NOTE — Progress Notes (Signed)
 PROGRESS NOTE Luis [name]  FMW:[id number] DOB: 06/09/1941 DOA: 04/07/2024 PCP: [name], MD  Brief Narrative/Hospital Course: Luis [name] is a 83 y.o. male with PMH of  metastatic colon cancer, mets to lungs, liver, LN's, sarcoidosis, COPD, CAD hx PCI, HFpEF, hypertension, hyperlipidemia, hypothyroidism, who presented with progressive shortness of breath onset x 1 wk  with  exertional dyspnea, chest congestion, minimally productive cough with clear and scant blood-tinged sputum, seen by pulmonology and was started on Augmentin  on Tuesday but by Thursday his symptoms started worsening and progressively worsened over the weekend.  He ended up checking his pulse oximeter at home which was reading in the high 60s so he presented to the ED  In the ED tachypneic, hypoxic,labs-mag low 1.5 hyperglycemic 291 lactic acidosis 3.3 Pro-Cal 1.5 leukocytosis 14.5, A1c 7.1, high sensitive troponin 29 proBNP 249 EKG sinus rhythm LAD LVH prolonged Qtc, situational up to 75% room air given 4 L nasal cannula Chest x-ray>> right lung base increased patchy airspace opacities and new left infrahilar airspace opacities concerning for multifocal pneumonia small right pleural effusion CT chest>> Postsurgical and radiation changes in the right lower lobe. Multifocal ground glass opacity with mosaic attenuation in the left lung, favoring mild multifocal infection/pneumonia, although pneumonitis related to immunotherapy is also possible.  Stable underlying sequela of sarcoidosis. Patient admitted for acute hypoxic respiratory failure with concern for multifocal pneumonia in the setting of metastatic colon cancer Home Eliquis continued, Lasix  trial given along with increased dose of prednisone  without significant improvement 10/3o Pulm consulted as patient dyspnea worsened on 10/30 10/31 s/p bronchoscopy,BAL sent for studies.culture-AFB smear negative culture no growth-PJP/fungal culture pending Patient continued on  IV diuretics pulmonary following 11/5: Brief episodes of hypoxia with need for NRB subsequently HFNC PCCM following back again  Subjective: Seen and examined Reports dyspnea with minimal activity Legs remain swollen Overnight afebrile BP soft this morning and mildly tachypneic, patient on HFNC at 9 L up from 6 L Labs with stable renal function hypokalemia 3.1 wt on admission 219>212>215> 205>216>214l> pending  Assessment and plan:  Acute respiratory failure with hypoxia  New left lung patchy GGO concerning for acute inflammation or infection-multifocal pneumonia versus pneumonitis Immunocompromise status in the setting of chemotherapy History of sarcoidosis on long-term steroid: CT chest:Multifocal GGO/mosaic attenuation  LL- suspect multifocal infection/pneumonia-? pneumonitis related to immunotherapy Reports being on steroid for last 2 weeks 20 mg of prednisone  here needing 4 L [place] ON Eliquis PTA-less likelihood of PE. TTE done- EF 70 to 75% G1 DD RV SF is normal RV size is normal. Blood culture NGTD Patient will continue on IV antibiotics aggressive bronchodilators Noticed to have ongoing leg edema, BNP 237-Home p.o. diuretics changed to IV Initially felt some better again had worsening dyspnea 10/30 still needing 4 L nasal cannula>PCCM consulted  S/P bronchoscopy BAL 10/31 - BAL culture-no growth to date AFB fungus PJP DFA- pending.  Cytology-atypical cells. Lasix  increased to 80 mg Q6 on 11/3> and now on bid dosing , has ongoing leg edema/ hypervolemia Continue ceftriaxone , doxycycline  x 14 days Continue prednisone  30 mg x 7 days then 20 x 7 days, 15 x 7 days, 10 x 7 days Patient has more oxygen requirement and gets short of breath on minimal activity-VBG 11/5 with pCO2 50 pH 7.4, CXR11/5: Increased LL opacities concerning for worsening pneumonia or asymmetric edema stable right basilar opacity Pulmonary continues to follow- seems worsenign overall- plan for CTA today.  Mild lactic  acidosis due to respiratory distress  Monitor la-recheck today.  Acute on chronic HFpEF Chronic leg edema Hypokalemia: weight a week ago was 216 pound He has been intentionally losing weight before that he was 240.Weight at 219 pound on admission now 212>215> 205> 216>214 and has ongoing leg edema  Lasix  increased 11/3> now continue to Lasix  twice daily as tolerated by renal function and blood pressure  Cont to monitor daily I/O,weight, electrolytes and net balance as below.Keep on  salt/fluid restricted diet and monitor in tele. Net IO Since Admission: -8,775 mL [04/17/24 1151]  Filed Weights   04/14/24 0628 04/15/24 0231 04/16/24 0410  Weight: 93.2 kg 98 kg 97.1 kg    Recent Labs  Lab 04/13/24 0647 04/14/24 0409 04/15/24 0538 04/16/24 0504 04/16/24 0857 04/17/24 0430  BNP  --   --   --   --  198.8*  --   BUN 27* 27* 23 28*  --  27*  CREATININE 0.97 1.01 0.95 1.10  --  1.23  K 3.8 3.9 3.8 3.8  --  3.1*   Elevated troponin: suspect demand ischemia with flat troponin. TTE is unremarkable  Hypokalemia Hypomagnesemia: Resolved  New onset type 2 diabetes with uncontrolled hyperglycemia: A1c at 7.1.  Initially cbg ~ 400-diet changed to carb controlled,started on glipizide and metformin ( will hold for now) cont ssi., Blood sugar fairly controlled.  Suspect in the setting of his steroid use as well Recent Labs  Lab 04/16/24 0831 04/16/24 1210 04/16/24 1632 04/16/24 2132 04/17/24 0827  GLUCAP 108* 203* 149* 127* 81    Metastatic colon cancer, R hemicolectomy, mets to lungs, liver, LN's: Followed by [facility], has known RUL nodule and opacities in the R base ? Lymphangitic spread v. Inflammation/infection (last in 6/'25). He is currently on maintenance 5FU and Panitumumab  every 2 weeks. Concern for immunotherapy mediated pneumonitis, patient on a steroid already as per pulmonary see above  Sarcoidosis: Follows with pulmonology, appears that he is currently on chronic  steroids   pHTN: Suggested on imaging, noted   HTN HLD CAD hx PCI: Bp soft today. No chest pain. Cont his DOAC, rosuvastatin , zetia . Recently off Ranolazine    Aflutter: Rate controlled. Cont home Apixaban, Dronedarone    Aortic ectasia: OP surveillance   Hypothyroidism: Continue home levothyroxine    Chronic anemia:  Stable at baseline.B12 and folate normal.  Thrombocytopenia: Monitor platelet count, question if from chemo  ?Chronic pain: ?Rosacea: continue home Minocycline-after completing antibiotics, continue gabapentin .   Class  I Obesity w/ Body mass index is 29.86 kg/m.: will benefit with PCP follow-up, weight loss,healthy lifestyle and outpatient sleep eval if not done.  PT Orders: Active PT Follow up Rec: Outpatient Pt (Pulmonary Rehab)04/17/2024 1000    DVT prophylaxis: Eliquis Code Status:   Code Status: Prior Family Communication: plan of care discussed with patient at bedside.  wife updated at the bedside multiple occasions Patient status is: Remains hospitalized because of severity of illness Level of care: Telemetry Medical   Dispo: The patient is from: home.            Anticipated disposition: TBD. Objective: Vitals last 24 hrs: Vitals:   04/17/24 0556 04/17/24 0830 04/17/24 0900 04/17/24 0923  BP: 131/77 (!) 94/59    Pulse: 87     Resp: 18 19 (!) 22   Temp: 98 F (36.7 C) (!) 97.4 F (36.3 C)    TempSrc: Oral Oral    SpO2: 93% (!) 86% 93% 93%  Weight:      Height:  Physical Examination: General exam:AAOX3 dyspneic with activity HEENT:Oral mucosa moist, Ear/Nose WNL grossly Respiratory system: B/L air no wheezing , some use of accessory muscle Cardiovascular system: S1 & S2 +, No JVD. Gastrointestinal system: Abdomen soft,NT,ND, BS+ Nervous System: Alert, awake, moving all extremities,and following commands. Extremities: extremities warm, leg edema +++ Skin: Warm, no rashes MSK: Normal muscle bulk,tone, power.   Medications  reviewed:  Scheduled Meds:  apixaban  5 mg Oral BID   budesonide -glycopyrrolate-formoterol  2 puff Inhalation BID   docusate sodium   100 mg Oral BID   doxycycline   100 mg Oral Q12H   dronedarone   400 mg Oral BID WC   ezetimibe   10 mg Oral Daily   furosemide   80 mg Intravenous BID   gabapentin   200 mg Oral QHS   glipiZIDE  2.5 mg Oral QAC breakfast   guaiFENesin   600 mg Oral BID   insulin aspart  0-15 Units Subcutaneous TID WC   levothyroxine   50 mcg Oral Q0600   [START ON 04/21/2024] minocycline  100 mg Oral Daily   montelukast   10 mg Oral QHS   potassium chloride   40 mEq Oral Q3H   predniSONE   30 mg Oral Q breakfast   rosuvastatin   20 mg Oral Daily   Continuous Infusions:  cefTRIAXone  (ROCEPHIN )  IV 2 g (04/16/24 1708)    Diet: Diet Order             Diet Carb Modified Fluid consistency: Thin; Room service appropriate? Yes; Fluid restriction: 1200 mL Fluid  Diet effective now                    Data Reviewed: I have personally reviewed following labs and imaging studies ( see epic result tab) CBC: Recent Labs  Lab 04/11/24 0407 04/12/24 0626 04/12/24 1056 04/14/24 1039 04/16/24 1013  WBC 11.7* 12.1* 14.5* 15.5* 17.9*  HGB 12.1* 12.5* 12.1* 12.7* 12.4*  HCT 37.9* 39.4 38.6* 39.9 38.6*  MCV 96.7 97.8 97.0 95.7 93.7  PLT 127* 161 162 167 149*   CMP: Recent Labs  Lab 04/13/24 0647 04/14/24 0409 04/15/24 0538 04/16/24 0504 04/17/24 0430  NA 134* 136 137 138 139  K 3.8 3.9 3.8 3.8 3.1*  CL 93* 95* 95* 94* 93*  CO2 27 30 31 29  32  GLUCOSE 114* 104* 90 110* 86  BUN 27* 27* 23 28* 27*  CREATININE 0.97 1.01 0.95 1.10 1.23  CALCIUM  8.2* 8.4* 8.5* 8.2* 7.9*   GFR: Estimated Creatinine Clearance: 54.1 mL/min (by C-G formula based on SCr of 1.23 mg/dL). No results for input(s): AST, ALT, ALKPHOS, BILITOT, PROT, ALBUMIN in the last 168 hours. No results for input(s): LIPASE, AMYLASE in the last 168 hours. No results for input(s): AMMONIA in the  last 168 hours. Coagulation Profile:  No results for input(s): INR, PROTIME in the last 168 hours.  Unresulted Labs (From admission, onward)     Start     Ordered   04/18/24 0500  Basic metabolic panel with GFR  Daily,   R      04/17/24 0941   04/18/24 0500  CBC  Daily,   R      04/17/24 0941   04/11/24 1312  KOH prep  RELEASE UPON ORDERING,   TIMED       Comments: Specimen A: Specimen Description LUL BAL Phone [phone] Immunocompromised?  No  Antibiotic Treatment:  none Is the patient on airborne/droplet precautions? No Clinical History:  N/A Special Instructions:  none Specimen  Disposition:  Microbiology     04/11/24 1312   04/11/24 1312  Pneumocystis smear by DFA  RELEASE UPON ORDERING,   TIMED       Comments: Specimen A: Specimen Description LUL BAL Phone [phone] Immunocompromised?  No  Antibiotic Treatment:  none Is the patient on airborne/droplet precautions? No Clinical History:  N/A Special Instructions:  none Specimen Disposition:  Microbiology     04/11/24 1312   04/11/24 1312  Acid Fast Culture with reflexed sensitivities  RELEASE UPON ORDERING,   TIMED       Comments: Specimen A: Specimen Description LUL BAL Phone [phone] Immunocompromised?  No  Antibiotic Treatment:  none Is the patient on airborne/droplet precautions? No Clinical History:  N/A Special Instructions:  none Specimen Disposition:  Microbiology     04/11/24 1312           Antimicrobials/Microbiology: Anti-infectives (From admission, onward)    Start     Dose/Rate Route Frequency Ordered Stop   04/21/24 1000  minocycline (MINOCIN) capsule 100 mg        100 mg Oral Daily 04/15/24 1245     04/13/24 2200  doxycycline  (VIBRA -TABS) tablet 100 mg        100 mg Oral Every 12 hours 04/13/24 1241 04/20/24 2359   04/12/24 1800  cefTRIAXone  (ROCEPHIN ) 2 g in sodium chloride  0.9 % 100 mL IVPB        2 g 200 mL/hr over 30 Minutes Intravenous Every 24 hours 04/12/24 1655 04/20/24 2359    04/12/24 1800  doxycycline  (VIBRAMYCIN ) 100 mg in sodium chloride  0.9 % 250 mL IVPB  Status:  Discontinued        100 mg 125 mL/hr over 120 Minutes Intravenous Every 12 hours 04/12/24 1655 04/13/24 1240   04/08/24 1000  azithromycin  (ZITHROMAX ) tablet 500 mg        500 mg Oral Daily 04/07/24 0238 04/09/24 0852   04/08/24 0000  cefTRIAXone  (ROCEPHIN ) 1 g in sodium chloride  0.9 % 100 mL IVPB        1 g 200 mL/hr over 30 Minutes Intravenous Every 24 hours 04/07/24 0238 04/11/24 0815   04/07/24 1000  minocycline (MINOCIN) capsule 100 mg  Status:  Discontinued        100 mg Oral Daily 04/07/24 0241 04/15/24 1204   04/07/24 0130  cefTRIAXone  (ROCEPHIN ) 2 g in sodium chloride  0.9 % 100 mL IVPB        2 g 200 mL/hr over 30 Minutes Intravenous  Once 04/07/24 0124 04/07/24 0255   04/07/24 0130  azithromycin  (ZITHROMAX ) 500 mg in sodium chloride  0.9 % 250 mL IVPB        500 mg 250 mL/hr over 60 Minutes Intravenous  Once 04/07/24 0124 04/07/24 0337         Component Value Date/Time   SDES BRONCHIAL ALVEOLAR LAVAGE 04/11/2024 1307   SPECREQUEST LUL 04/11/2024 1307   CULT  04/11/2024 1307    NO GROWTH Performed at [facility], [address] [place], [name] [address]    REPTSTATUS 04/13/2024 FINAL 04/11/2024 1307    Procedures: Procedure(s) (LRB): VIDEO BRONCHOSCOPY WITHOUT FLUORO (N/A) Mennie LAMY, MD [facility] 04/17/2024, 12:55 PM

## 2024-04-17 NOTE — Progress Notes (Signed)
 Mobility Specialist Progress Note:    04/17/24 1350  Mobility  Activity  (Bed mob)  Range of Motion/Exercises Active;Right leg;Left leg  Activity Response Tolerated well  Mobility Referral Yes  Mobility visit 1 Mobility  Mobility Specialist Start Time (ACUTE ONLY) 1340  Mobility Specialist Stop Time (ACUTE ONLY) 1350  Mobility Specialist Time Calculation (min) (ACUTE ONLY) 10 min   Received pt in bed and refused OOB mobility d/t not feeling well. Pt agreeable to bed mobility. Pt completed x10 BLE ankle pumps and x10 leg lifts. Pt returned supine in bed. Personal belongings and call light within reach. All needs met.  Lavanda Pollack Mobility Specialist  Please contact via [employer] or  Rehab Office [phone]

## 2024-04-18 ENCOUNTER — Inpatient Hospital Stay (HOSPITAL_COMMUNITY)

## 2024-04-18 DIAGNOSIS — J189 Pneumonia, unspecified organism: Secondary | ICD-10-CM | POA: Diagnosis not present

## 2024-04-18 DIAGNOSIS — J90 Pleural effusion, not elsewhere classified: Secondary | ICD-10-CM | POA: Diagnosis not present

## 2024-04-18 DIAGNOSIS — R918 Other nonspecific abnormal finding of lung field: Secondary | ICD-10-CM | POA: Diagnosis not present

## 2024-04-18 DIAGNOSIS — J45909 Unspecified asthma, uncomplicated: Secondary | ICD-10-CM | POA: Diagnosis not present

## 2024-04-18 DIAGNOSIS — J982 Interstitial emphysema: Secondary | ICD-10-CM | POA: Diagnosis not present

## 2024-04-18 DIAGNOSIS — J9601 Acute respiratory failure with hypoxia: Secondary | ICD-10-CM | POA: Diagnosis not present

## 2024-04-18 LAB — CBC
HCT: 37 % — ABNORMAL LOW (ref 39.0–52.0)
Hemoglobin: 11.9 g/dL — ABNORMAL LOW (ref 13.0–17.0)
MCH: 30.2 pg (ref 26.0–34.0)
MCHC: 32.2 g/dL (ref 30.0–36.0)
MCV: 93.9 fL (ref 80.0–100.0)
Platelets: 143 K/uL — ABNORMAL LOW (ref 150–400)
RBC: 3.94 MIL/uL — ABNORMAL LOW (ref 4.22–5.81)
RDW: 16.9 % — ABNORMAL HIGH (ref 11.5–15.5)
WBC: 13 K/uL — ABNORMAL HIGH (ref 4.0–10.5)
nRBC: 0 % (ref 0.0–0.2)

## 2024-04-18 LAB — GLUCOSE, CAPILLARY
Glucose-Capillary: 70 mg/dL (ref 70–99)
Glucose-Capillary: 201 mg/dL — ABNORMAL HIGH (ref 70–99)
Glucose-Capillary: 131 mg/dL — ABNORMAL HIGH (ref 70–99)
Glucose-Capillary: 78 mg/dL (ref 70–99)

## 2024-04-18 LAB — BASIC METABOLIC PANEL WITH GFR
Anion gap: 12 (ref 5–15)
BUN: 26 mg/dL — ABNORMAL HIGH (ref 8–23)
CO2: 30 mmol/L (ref 22–32)
Calcium: 7.7 mg/dL — ABNORMAL LOW (ref 8.9–10.3)
Chloride: 92 mmol/L — ABNORMAL LOW (ref 98–111)
Creatinine, Ser: 1.35 mg/dL — ABNORMAL HIGH (ref 0.61–1.24)
GFR, Estimated: 52 mL/min — ABNORMAL LOW (ref >60)
Glucose, Bld: 70 mg/dL (ref 70–99)
Potassium: 3.6 mmol/L (ref 3.5–5.1)
Sodium: 134 mmol/L — ABNORMAL LOW (ref 135–145)

## 2024-04-18 LAB — HEMOGLOBIN AND HEMATOCRIT, BLOOD
HCT: 34.9 % — ABNORMAL LOW (ref 39.0–52.0)
Hemoglobin: 11.1 g/dL — ABNORMAL LOW (ref 13.0–17.0)

## 2024-04-18 MED ORDER — FUROSEMIDE 10 MG/ML IJ SOLN: 40.0000 mg | Freq: Every day | INTRAMUSCULAR | Status: DC

## 2024-04-18 MED ORDER — PANTOPRAZOLE SODIUM 40 MG IV SOLR
40.0000 mg | Freq: Two times a day (BID) | INTRAVENOUS | Status: DC
  Administered 2024-04-18 – 2024-04-21 (×6): 40 mg via INTRAVENOUS
  Filled 2024-04-18 (×6): qty 10

## 2024-04-18 NOTE — Progress Notes (Signed)
 NAME:  Luis [name], MRN:  [id number], DOB:  Jun 03, 1941, LOS: 11 ADMISSION DATE:  04/07/2024, CONSULTATION DATE:  10/30 REFERRING MD:  [facility], CHIEF COMPLAINT:  acute resp failure   History of Present Illness:  [name] is an 83 y/o gentleman with a history of pulmonary sarcoidosis on chronic steriods, COPD-asthma overlap on triple inhaled therapy & Dupixent , stage IV cecal adenocarcinoma being treated at [facility] since 2019 who presented to the hospital with progressive shortness of breath and dry cough over 4 days on 04/07/2024.  He had been seen in pulmonary clinic today following his symptoms starting on 7/23.  He had an exacerbation of his respiratory disease in March and September of this year.  Since his exacerbation in September he had gone back on prednisone , tapering from 30 mg to 20 mg to 10 mg daily.  When he got back down to 10 mg daily dose he felt his symptoms flared up again, similar to tapers done in the 1990s when he was on chronic steroids for sarcoidosis.  He has only been on intermittent steroids since 1990s; he has not been on steroid sparing DMARD therapy due to quiescent sarcoidosis over the years.  He has not had sick contacts recently.  No new symptoms other than some leg swelling consistent with hospital.  No fevers.  He has had green sputum production in the hospital, but did not have sputum at home.  He is not on home oxygen.  He last had chemotherapy 2 weeks ago-pembrolizumab and 5-fluorouracil .  He felt well after this for a few days before starting to feel run down.  His meds are not to take primary support.  He is on Eliquis for DVT diagnosed in late July 2025 & Afib.  Patient hospital because required 4 to 6 L nasal cannula.  He has been maintained on azithromycin , ceftriaxone , prednisone  40mg  daily. Overall he felt a little better yesterday but is more dyspneic again today.   Pertinent  Medical History  Afib, recent cardioversion Metastatic adenocarcinoma of  cecum Pulmonary sarcoidosis diagnosed by open lung biopsy in 1990s COPD asthma overlap Coronary artery disease HTN HLD  Significant Hospital Events: Including procedures, antibiotic start and stop dates in addition to other pertinent events   10/26 admitted to the hospital, started on ceftriaxone  and azithromycin , prednisone  40 mg daily 10/29 completed azithromycin  10/30 stopped ceftriaxone  11/5 desaturation at 0400 overnight, transition to 4 L/min.  Then up to 10 L/min when he was moved to chair.  Now weaned to 6 L/min 11/6 CT-PA chest >> no PE, calcified hilar and mediastinal nodes, new mediastinal air without any evidence for pneumothorax.  Right lung base infiltrate unchanged, some ground glass on the left unchanged  Interim History / Subjective:  He is overall feeling better but does have some left-sided chest discomfort that is new Oxygen has been weaned to 3 L/min and SpO2 1.00.  Question whether we were getting accurate data CT-PA with evidence for pneumomediastinum as above, otherwise unchanged  Objective    Blood pressure 102/68, pulse 79, temperature (!) 97.3 F (36.3 C), temperature source Oral, resp. rate 20, height 5' 11 (1.803 m), weight 97.1 kg, SpO2 95%.        Intake/Output Summary (Last 24 hours) at 04/18/2024 0941 Last data filed at 04/18/2024 0607 Gross per 24 hour  Intake 400 ml  Output 700 ml  Net -300 ml   Filed Weights   04/14/24 0628 04/15/24 0231 04/16/24 0410  Weight: 93.2 kg 98 kg  97.1 kg    Examination: General: Chronically ill man, comfortable in bed on O2 HENT: Somewhat hoarse voice, no secretions, oropharynx clear Lungs: Comfortable, scattered crackles and wheezes Cardiovascular: 1+ bilateral lower extremity pitting edema (improved).  Heart distant without a murmur Neuro: Awake and alert, appropriate, nonfocal   Resolved problem list   Assessment and Plan   Community-acquired pneumonia: Patchy infiltrates ground glass on the left,  right basilar consolidation superimposed on some old scar consistent with CAP.  BAL with greater than 50% neutrophils.  Concerning for suppurative, bacterial infection.  Cultures are negative, likely due to preceding antibiotics -Complete 14-day course ceftriaxone  - I-S, pulmonary hygiene  Pneumomediastinum, new - Follow serial chest x-rays.  Likely no intervention needs to be made as long as stable.  He is at risk for evolving pneumothorax so discussed with him the importance of notifying caregivers for any change in breathing, chest pain, pleuritic pain, etc.  Multifactorial hypoxemic respiratory failure due to right lower lobe CAP, sarcoidosis with associated asthma, component of volume overload -Weaning oxygen effectively, question whether we were getting accurate SpO2 - Complete antibiotics as above - Continue Breztri  - Agree with prolonged prednisone  taper.  Hopefully he will wean off completely as an outpatient although he has required intermittent prednisone  dosing even at baseline - Suspect he will need oxygen at time of discharge  HFpEF, lower extremity swelling: Likely due to fluid administration this hospitalization.  Possibly prolonging hypoxemia. -Has tolerated diuresis, serum creatinine beginning to bump.  Will decrease his Lasix  to 40 mg IV daily today, can likely transition back to his home regimen soon -Follow BMP  Asthma:  -Home regimen of Dupixent , Singulair , Breztri , albuterol .  Would be reasonable for him to have his Dupixent  (he is due).  He could bring in his home med for administration here -Plan for prednisone  taper off as an outpatient: Prednisone  taper to start on discharge: Prednisone  30 mg daily for 7 days, Prednisone  20 mg daily for 7 days, Prednisone  15 mg daily for 7 days, Prednisone  10 mg daily for 7 days, Prednisone  5 mg daily for 7 days, then stop   Labs   CBC: Recent Labs  Lab 04/12/24 0626 04/12/24 1056 04/14/24 1039 04/16/24 1013 04/18/24 0312   WBC 12.1* 14.5* 15.5* 17.9* 13.0*  HGB 12.5* 12.1* 12.7* 12.4* 11.9*  HCT 39.4 38.6* 39.9 38.6* 37.0*  MCV 97.8 97.0 95.7 93.7 93.9  PLT 161 162 167 149* 143*    Basic Metabolic Panel: Recent Labs  Lab 04/14/24 0409 04/15/24 0538 04/16/24 0504 04/17/24 0430 04/18/24 0312  NA 136 137 138 139 134*  K 3.9 3.8 3.8 3.1* 3.6  CL 95* 95* 94* 93* 92*  CO2 30 31 29  32 30  GLUCOSE 104* 90 110* 86 70  BUN 27* 23 28* 27* 26*  CREATININE 1.01 0.95 1.10 1.23 1.35*  CALCIUM  8.4* 8.5* 8.2* 7.9* 7.7*      Critical care time: n/a    Lamar Chris, MD, PhD 04/18/2024, 9:41 AM [facility] [phone] or if no answer before 7:00PM call [phone] For any issues after 7:00PM please call eLink [phone]

## 2024-04-18 NOTE — Progress Notes (Signed)
 SATURATION QUALIFICATIONS: (This note is used to comply with regulatory documentation for home oxygen)  Patient Saturations on Room Air at Rest (sitting EOB) = 86%  Patient Saturations on Room Air while Ambulating = N/A  Patient Saturations on 4 Liters of oxygen while Ambulating = 90%  Please briefly explain why patient needs home oxygen: Pt hypoxic on RA at rest, needed increase to 4L/min to maintain SpO2 90% and above. Pt desat to 84% on 2-3L/min O2 [place] with minimal standing exertion.

## 2024-04-18 NOTE — Progress Notes (Signed)
 Occupational Therapy Treatment Patient Details Name: Luis [name] MRN: [id number] DOB: [birth year] Today's Date: 04/18/2024   History of present illness 83 y.o. male presents to [facility] hospital on 04/07/2024 with progressive SOB. Pt underwent bronchoscopy with biopsies 10/31. PMH includes metastatic colon CA with mets to lungs, liver, and lymph nodes, sarcoidosis, COPD, CAD, HFpEF, HTN, HLD, hypothyroidism.   OT comments  Pt. Seen for skilled OT treatment session.  Cont. Focus and reiterating of EC strategies during mobility and ADL completion.  Pt. Initiating rest breaks w/o cues.  Reports difficulty with PLB secondary to it triggering coughing on the exhale.  Bed mobility S in/out of bed with heavy reliance on hob elevation and use of lower bed rails.  Stand pivot CGA with cues for tubing management during pivot.  Next session cont. with increasing activity tolerance during ADL completion with utilization of energy conservation strategies.        If plan is discharge home, recommend the following:  A little help with walking and/or transfers;Direct supervision/assist for medications management;Assist for transportation;Help with stairs or ramp for entrance;Assistance with cooking/housework;A little help with bathing/dressing/bathroom   Equipment Recommendations  BSC/3in1;Tub/shower seat    Recommendations for Other Services      Precautions / Restrictions Precautions Precautions: Fall Precaution/Restrictions Comments: Monitor SpO2       Mobility Bed Mobility Overal bed mobility: Needs Assistance Bed Mobility: Supine to Sit     Supine to sit: Supervision     General bed mobility comments: hob elevated to just below 90 degrees, pt. with  heavy reliance on lower bed rails to lean forward and scoot hips back to get feet out of the foot board    Transfers Overall transfer level: Needs assistance Equipment used: None Transfers: Sit to/from Stand, Bed to chair/wheelchair/BSC    Stand pivot transfers: Contact guard assist         General transfer comment: cues and assistance for tube/line management during pivot     Balance                                           ADL either performed or assessed with clinical judgement   ADL Overall ADL's : Needs assistance/impaired                         Toilet Transfer: Contact guard assist;BSC/3in1;Cueing for sequencing;Stand-pivot Toilet Transfer Details (indicate cue type and reason): cues for o2 tubing management and male pure wik tubing                Extremity/Trunk Assessment              Vision       Perception     Praxis     Communication Communication Communication: No apparent difficulties   Cognition Arousal: Alert Behavior During Therapy: WFL for tasks assessed/performed Cognition: No apparent impairments                               Following commands: Intact        Cueing   Cueing Techniques: Verbal cues  Exercises      Shoulder Instructions       General Comments  Reviewed monitoring socks for moving up/down to allow prevention of skin indention/issues, paired with elevation as able.  Pertinent Vitals/ Pain       Pain Assessment Pain Assessment: No/denies pain  Home Living                                          Prior Functioning/Environment              Frequency  Min 2X/week        Progress Toward Goals  OT Goals(current goals can now be found in the care plan section)  Progress towards OT goals: Progressing toward goals     Plan      Co-evaluation                 AM-PAC OT 6 Clicks Daily Activity     Outcome Measure   Help from another person eating meals?: None Help from another person taking care of personal grooming?: A Little Help from another person toileting, which includes using toliet, bedpan, or urinal?: A Little Help from another person bathing  (including washing, rinsing, drying)?: A Lot Help from another person to put on and taking off regular upper body clothing?: A Little Help from another person to put on and taking off regular lower body clothing?: A Lot 6 Click Score: 17    End of Session    OT Visit Diagnosis: Muscle weakness (generalized) (M62.81)   Activity Tolerance Patient tolerated treatment well   Patient Left in bed;with call bell/phone within reach   Nurse Communication Other (comment);Mobility status (rn states ok to work with pt. and also states pt. allowed to transfer to/from bsc without staff assistance so no bed alarm on.  moved bsc closer to bed and reviewed importance of tube management with pt. during transfers)        Time: 9097-9084 OT Time Calculation (min): 13 min  Charges: OT General Charges $OT Visit: 1 Visit OT Treatments $Self Care/Home Management : 8-22 mins  Randall, COTA/L Acute Rehabilitation [phone]   CHRISTELLA Nest Lorraine-COTA/L  04/18/2024, 10:01 AM

## 2024-04-18 NOTE — Progress Notes (Signed)
 Physical Therapy Treatment Patient Details Name: Luis [name] MRN: [id number] DOB: [birth year] Today's Date: 04/18/2024   History of Present Illness 83 y.o. male presents to [facility] hospital on 04/07/2024 with progressive SOB. Pt underwent bronchoscopy with biopsies 10/31. Symptomatic orthostatic hypotension during PT session 11/7. PMH includes metastatic colon CA with mets to lungs, liver, and lymph nodes, sarcoidosis, COPD, CAD, HFpEF, HTN, HLD, hypothyroidism.    PT Comments  Pt received in supine, agreeable to therapy session, with good participation as able. Attempted to wean O2 to 2L, however once standing pt with increased WOB and needing increase to 4L/min to maintain SpO2 >90%. Pt quick to fatigue and more winded this session, so PTA checked orthostatic BP and pt found to have symptomatic orthostatic hypotension, RN/MD notified. He may benefit from trial of BLE compression socks for next PT session to see if this improves hemodynamic stability with postural changes. Pt continues to benefit from PT services to progress toward functional mobility goals.  Patient Position (if appropriate) Orthostatic Vitals  Orthostatic Lying   BP- Lying 115/74  Pulse- Lying 78  Orthostatic Sitting  BP- Sitting 109/70  Pulse- Sitting 100  Orthostatic Standing at 0 minutes  BP- Standing at 0 minutes (!) 81/53  Pulse- Standing at 0 minutes 105   Orthostatic Lying   BP- Lying 109/75 (after return to supine)  Pulse- Lying 112 (Afib per tele)     If plan is discharge home, recommend the following: Assistance with cooking/housework;Assist for transportation;Help with stairs or ramp for entrance   Can travel by private vehicle        Equipment Recommendations  None recommended by PT;Other (comment) (likely to have supplemental O2 needs)    Recommendations for Other Services       Precautions / Restrictions Precautions Precautions: Fall Recall of Precautions/Restrictions:  Intact Precaution/Restrictions Comments: orthostatic hypotension 11/7; DOE 3/4 Restrictions Weight Bearing Restrictions Per Provider Order: No     Mobility  Bed Mobility Overal bed mobility: Needs Assistance Bed Mobility: Supine to Sit, Sit to Supine     Supine to sit: Modified independent (Device/Increase time), HOB elevated, Used rails Sit to supine: Supervision, Used rails   General bed mobility comments: pt using hospital bed features    Transfers Overall transfer level: Needs assistance Equipment used: Rollator (4 wheels), None Transfers: Sit to/from Stand Sit to Stand: Supervision, Contact guard assist           General transfer comment: cues for safe UE placement with stand>sit, pt ignoring cues when fatigued and does not reach back to seated surface    Ambulation/Gait Ambulation/Gait assistance: Contact guard assist Gait Distance (Feet): 20 Feet Assistive device: Rollator (4 wheels) Gait Pattern/deviations: Step-through pattern, Decreased stride length, Trunk flexed Gait velocity: reduced Gait velocity interpretation: <1.31 ft/sec, indicative of household ambulator   General Gait Details: SpO2 desat to 84% on 2L/min O2 , when increased to 3L/min, SpO2 improved to 86%, he needed 4L/min to maintain SpO2 90% and above. Significant standing trunk flexion and DOE during standing break, cues for posture and pursed-lip breathing. Pt c/o fatigue so checked orthostatics on next standing trial, pt with OH so defer additional gait trials for pt safety.   Stairs             Wheelchair Mobility     Tilt Bed    Modified Rankin (Stroke Patients Only)       Balance Overall balance assessment: Needs assistance Sitting-balance support: Single extremity supported, Feet supported Sitting balance-Leahy Scale:  Good     Standing balance support: Bilateral upper extremity supported, During functional activity, Reliant on assistive device for balance Standing  balance-Leahy Scale: Poor Standing balance comment: Pt dependent on AD                            Communication Communication Communication: No apparent difficulties  Cognition Arousal: Alert Behavior During Therapy: WFL for tasks assessed/performed   PT - Cognitive impairments: Safety/Judgement, Memory, Attention                       PT - Cognition Comments: Internally distracted due to increased WOB with functional tasks. Pt with slow processing while standing, so PTA decided to check orthostatic BP readings, pt found to be orthostatic, RN/MD notified. Following commands: Intact      Cueing Cueing Techniques: Verbal cues  Exercises      General Comments General comments (skin integrity, edema, etc.): SpO2 WFL on 4L/min O2 [place], desat on <4L; DOE 3-4/4; see BP in comments above      Pertinent Vitals/Pain Pain Assessment Pain Assessment: No/denies pain Pain Intervention(s): Monitored during session, Repositioned    Home Living                          Prior Function            PT Goals (current goals can now be found in the care plan section) Acute Rehab PT Goals PT Goal Formulation: With patient/family Time For Goal Achievement: 04/22/24 Progress towards PT goals: Progressing toward goals    Frequency    Min 2X/week      PT Plan      Co-evaluation              AM-PAC PT 6 Clicks Mobility   Outcome Measure  Help needed turning from your back to your side while in a flat bed without using bedrails?: A Little Help needed moving from lying on your back to sitting on the side of a flat bed without using bedrails?: A Little Help needed moving to and from a bed to a chair (including a wheelchair)?: A Little Help needed standing up from a chair using your arms (e.g., wheelchair or bedside chair)?: A Little Help needed to walk in hospital room?: A Little Help needed climbing 3-5 steps with a railing? : Total 6 Click Score:  16    End of Session Equipment Utilized During Treatment: Oxygen Activity Tolerance: Treatment limited secondary to medical complications (Comment);Other (comment);Patient limited by fatigue (symptomatic orthostatic hypotension) Patient left: in bed;with call bell/phone within reach;with bed alarm set;Other (comment) (HOB >40 deg per pt request) Nurse Communication: Mobility status;Other (comment);Precautions (BP readings in flowsheet; needs to trial BLE compression socks) PT Visit Diagnosis: Other abnormalities of gait and mobility (R26.89)     Time: 8446-8377 PT Time Calculation (min) (ACUTE ONLY): 29 min  Charges:    $Gait Training: 8-22 mins $Therapeutic Activity: 8-22 mins PT General Charges $$ ACUTE PT VISIT: 1 Visit                     Wenonah Milo P., PTA Acute Rehabilitation Services Secure Chat Preferred 9a-5:30pm Office: [phone]    Connell HERO [facility] 04/18/2024, 4:56 PM

## 2024-04-18 NOTE — Progress Notes (Addendum)
 PROGRESS NOTE    Luis [name]  FMW:[id number] DOB: 1941-02-02 DOA: 04/07/2024 PCP: [name], MD   Brief Narrative:   Luis [name] is a 83 y.o. male with PMH of  metastatic colon cancer, mets to lungs, liver, LN's, sarcoidosis, COPD, CAD hx PCI, HFpEF, hypertension, hyperlipidemia, hypothyroidism, who presented with progressive shortness of breath onset x 1 wk  with  exertional dyspnea, chest congestion, minimally productive cough with clear and scant blood-tinged sputum, seen by pulmonology and was started on Augmentin  on Tuesday but by Thursday his symptoms started worsening and progressively worsened over the weekend.  He ended up checking his pulse oximeter at home which was reading in the high 60s so he presented to the ED  In the ED tachypneic, hypoxic,labs-mag low 1.5 hyperglycemic 291 lactic acidosis 3.3 Pro-Cal 1.5 leukocytosis 14.5, A1c 7.1, high sensitive troponin 29 proBNP 249 EKG sinus rhythm LAD LVH prolonged Qtc, situational up to 75% room air given 4 L nasal cannula Chest x-ray>> right lung base increased patchy airspace opacities and new left infrahilar airspace opacities concerning for multifocal pneumonia small right pleural effusion CT chest>> Postsurgical and radiation changes in the right lower lobe. Multifocal ground glass opacity with mosaic attenuation in the left lung, favoring mild multifocal infection/pneumonia, although pneumonitis related to immunotherapy is also possible.  Stable underlying sequela of sarcoidosis. Patient admitted for acute hypoxic respiratory failure with concern for multifocal pneumonia in the setting of metastatic colon cancer Home Eliquis continued, Lasix  trial given along with increased dose of prednisone  without significant improvement 10/3o Pulm consulted as patient dyspnea worsened on 10/30 10/31 s/p bronchoscopy,BAL sent for studies.culture-AFB smear negative culture no growth-PJP/fungal culture pending Patient continued on IV  diuretics pulmonary following 11/5: Brief episodes of hypoxia with need for NRB subsequently HFNC PCCM following back again 11/7: Repeat CTA negative for PE.  Pneumomediastinum noted.  Hypoxia is improving, patient subjectively feels better  Subjective: Seen and examined Reports improvement in symptoms in the past 24 to 48 hours.  He is now weaned down to 4 L/min of supplemental oxygen. Bilateral lower extremity edema is improving.  Patient denies chest pain.  Shortness of breath with activity persist.    Assessment & Plan:   Principal Problem:   Acute hypoxic respiratory failure (HCC) Active Problems:   Immunocompromised   Multifocal pneumonia  Acute respiratory failure with hypoxia  New left lung patchy GGO concerning for acute inflammation or infection-multifocal pneumonia versus pneumonitis Immunocompromise status in the setting of chemotherapy History of sarcoidosis on long-term steroid: CT chest:Multifocal GGO/mosaic attenuation  LL- suspect multifocal infection/pneumonia-? pneumonitis related to immunotherapy Reports being on steroid for last 2 weeks 20 mg of prednisone  here needing 4 L [place] ON Eliquis PTA-less likelihood of PE. TTE done- EF 70 to 75% G1 DD RV SF is normal RV size is normal. Blood culture NGTD Patient will continue on IV antibiotics aggressive bronchodilators Noticed to have ongoing leg edema, BNP 237-Home p.o. diuretics changed to IV Initially felt some better again had worsening dyspnea 10/30 still needing 4 L nasal cannula>PCCM consulted  S/P bronchoscopy BAL 10/31 - BAL culture-no growth to date AFB fungus PJP DFA- pending.  Cytology-atypical cells. Lasix  increased to 80 mg Q6 on 11/3> and now on bid dosing , has ongoing leg edema/ hypervolemia Continue ceftriaxone , doxycycline  x 14 days Continue prednisone  30 mg x 7 days then 20 x 7 days, 15 x 7 days, 10 x 7 days Patient has more oxygen requirement and gets short of  breath on minimal activity-VBG 11/5 with  pCO2 50 pH 7.4, CXR11/5: Increased LL opacities concerning for worsening pneumonia or asymmetric edema stable right basilar opacity Pulmonary continues to follow-patient clinically improving in the past 24 hours  Pneumomediastinum: Will monitor closely.   Mild lactic acidosis due to respiratory distress   Acute on chronic HFpEF Chronic leg edema Hypokalemia: weight a week ago was 216 pound He has been intentionally losing weight before that he was 240.Weight at 219 pound on admission now 212>215> 205> 216>214 and has ongoing leg edema  Lasix  decreased to 40 mg IV daily today.   Cont to monitor daily I/O,weight, electrolytes and net balance as below.Keep on  salt/fluid restricted diet and monitor in tele.  Replace electrolytes as needed.   Weight: 93.2 kg 98 kg 97.1 kg      Elevated troponin: suspect demand ischemia with flat troponin. TTE is unremarkable   Hypokalemia Hypomagnesemia: Resolved   New onset type 2 diabetes with uncontrolled hyperglycemia: A1c at 7.1.  Initially cbg ~ 400-diet changed to carb controlled,started on glipizide and metformin ( will hold for now) cont ssi., Blood sugar fairly controlled.  Suspect in the setting of his steroid use as well  Metastatic colon cancer, R hemicolectomy, mets to lungs, liver, LN's: Followed by [facility], has known RUL nodule and opacities in the R base ? Lymphangitic spread v. Inflammation/infection (last in 6/'25). He is currently on maintenance 5FU and Panitumumab  every 2 weeks. Concern for immunotherapy mediated pneumonitis, patient on a steroid already as per pulmonary see above   Sarcoidosis: Follows with pulmonology, appears that he is currently on chronic steroids    pHTN: Suggested on imaging, noted    HTN HLD CAD hx PCI: Bp soft today. No chest pain. Cont his DOAC, rosuvastatin , zetia . Recently off Ranolazine     Aflutter: Rate controlled. Cont home Apixaban, Dronedarone     Aortic ectasia: OP surveillance     Hypothyroidism: Continue home levothyroxine     Chronic anemia:  Stable at baseline.B12 and folate normal.   Thrombocytopenia: Monitor platelet count, question if from chemo   ?Chronic pain: ?Rosacea: continue home Minocycline-after completing antibiotics, continue gabapentin .    Class  I Obesity w/ Body mass index is 29.86 kg/m.: will benefit with PCP follow-up, weight loss,healthy lifestyle and outpatient sleep eval if not done.   PT Orders: Active PT Follow up Rec: Outpatient Pt (Pulmonary Rehab)04/17/2024 1000     DVT prophylaxis: Eliquis Code Status:   Code Status: Prior Family Communication: plan of care discussed with patient at bedside.  wife updated at the bedside multiple occasions Patient status is: Remains hospitalized because of severity of illness Level of care: Telemetry Medical    Dispo: The patient is from: home.            Anticipated disposition: TBD.   Objective: Vitals:   04/17/24 2329 04/18/24 0427 04/18/24 0740 04/18/24 0852  BP: (!) 144/81  102/68   Pulse:   79   Resp:   20   Temp: 97.6 F (36.4 C) 97.7 F (36.5 C) (!) 97.3 F (36.3 C)   TempSrc: Oral Oral Oral   SpO2:   93% 95%  Weight:      Height:        Intake/Output Summary (Last 24 hours) at 04/18/2024 1441 Last data filed at 04/18/2024 0607 Gross per 24 hour  Intake 0 ml  Output 700 ml  Net -700 ml   Filed Weights   04/14/24 0628 04/15/24 0231 04/16/24 0410  Weight: 93.2 kg 98 kg 97.1 kg    Examination:  General: Alert and oriented not in any acute distress HEENT: Moist oral mucosa Chest: Bilateral crackles, no wheezing CVS: S1, S2, no murmur, regular rhythm Abdomen: Soft, nontender Extremities: Bilateral lower extremity edema   Data Reviewed: I have personally reviewed following labs and imaging studies  CBC: Recent Labs  Lab 04/12/24 0626 04/12/24 1056 04/14/24 1039 04/16/24 1013 04/18/24 0312  WBC 12.1* 14.5* 15.5* 17.9* 13.0*  HGB 12.5* 12.1* 12.7*  12.4* 11.9*  HCT 39.4 38.6* 39.9 38.6* 37.0*  MCV 97.8 97.0 95.7 93.7 93.9  PLT 161 162 167 149* 143*   Basic Metabolic Panel: Recent Labs  Lab 04/14/24 0409 04/15/24 0538 04/16/24 0504 04/17/24 0430 04/18/24 0312  NA 136 137 138 139 134*  K 3.9 3.8 3.8 3.1* 3.6  CL 95* 95* 94* 93* 92*  CO2 30 31 29  32 30  GLUCOSE 104* 90 110* 86 70  BUN 27* 23 28* 27* 26*  CREATININE 1.01 0.95 1.10 1.23 1.35*  CALCIUM  8.4* 8.5* 8.2* 7.9* 7.7*   GFR: Estimated Creatinine Clearance: 49.3 mL/min (A) (by C-G formula based on SCr of 1.35 mg/dL (H)). Liver Function Tests: No results for input(s): AST, ALT, ALKPHOS, BILITOT, PROT, ALBUMIN in the last 168 hours. No results for input(s): LIPASE, AMYLASE in the last 168 hours. No results for input(s): AMMONIA in the last 168 hours. Coagulation Profile: No results for input(s): INR, PROTIME in the last 168 hours. Cardiac Enzymes: No results for input(s): CKTOTAL, CKMB, CKMBINDEX, TROPONINI in the last 168 hours. BNP (last 3 results) Recent Labs    01/02/24 1445  PROBNP 221.0*   HbA1C: No results for input(s): HGBA1C in the last 72 hours. CBG: Recent Labs  Lab 04/17/24 1239 04/17/24 1645 04/17/24 2209 04/18/24 0604 04/18/24 1254  GLUCAP 190* 105* 186* 70 201*   Lipid Profile: No results for input(s): CHOL, HDL, LDLCALC, TRIG, CHOLHDL, LDLDIRECT in the last 72 hours. Thyroid Function Tests: No results for input(s): TSH, T4TOTAL, FREET4, T3FREE, THYROIDAB in the last 72 hours. Anemia Panel: No results for input(s): VITAMINB12, FOLATE, FERRITIN, TIBC, IRON, RETICCTPCT in the last 72 hours. Sepsis Labs: Recent Labs  Lab 04/16/24 0857 04/17/24 1108  LATICACIDVEN 2.2* 3.0*    Recent Results (from the past 240 hours)  Expectorated Sputum Assessment w Gram Stain, Rflx to Resp Cult     Status: None   Collection Time: 04/10/24  2:46 PM   Specimen: Expectorated Sputum   Result Value Ref Range Status   Specimen Description EXPECTORATED SPUTUM  Final   Special Requests NONE  Final   Sputum evaluation   Final    Sputum specimen not acceptable for testing.  Please recollect.   Gram Stain Report Called to,Read Back By and Verified With: RN MYRTIS GLEASON [phone] @ [phone] FH Performed at [facility], [address] [place], [name] [address]    Report Status 04/10/2024 FINAL  Final  Fungus Culture With Stain     Status: None (Preliminary result)   Collection Time: 04/11/24  1:07 PM   Specimen: Bronchial Alveolar Lavage; Respiratory  Result Value Ref Range Status   Fungus Stain Final report  Final    Comment: (NOTE) Performed At: [facility] [address] [place], [name] [id number] Jennette Shorter MD Ey:[phone]    Fungus (Mycology) Culture PENDING  Incomplete   Fungal Source BRONCHIAL ALVEOLAR LAVAGE  Final    Comment: Performed at [facility], [address] [place], [name] [address]  Culture, BAL-quantitative w Gram Stain     Status: None   Collection Time: 04/11/24  1:07 PM   Specimen: Bronchial Alveolar Lavage; Respiratory  Result Value Ref Range Status   Specimen Description BRONCHIAL ALVEOLAR LAVAGE  Final   Special Requests LUL  Final   Gram Stain   Final    FEW WBC PRESENT, PREDOMINANTLY PMN NO ORGANISMS SEEN    Culture   Final    NO GROWTH Performed at [facility], [address], [place], [name] [address]    Report Status 04/13/2024 FINAL  Final  Acid Fast Smear (AFB)     Status: None   Collection Time: 04/11/24  1:07 PM   Specimen: Bronchial Alveolar Lavage; Respiratory  Result Value Ref Range Status   AFB Specimen Processing Concentration  Final   Acid Fast Smear Negative  Final    Comment: (NOTE) Performed At: [facility] [address] [place], [name] [id number] Jennette Shorter MD Ey:[phone]    Source (AFB) BRONCHIAL ALVEOLAR LAVAGE  Final    Comment: Performed at [facility], [address], [place], [name] [address]  Fungus Culture Result     Status: None   Collection Time: 04/11/24  1:07 PM  Result Value Ref Range Status   Result 1 Comment  Final    Comment: (NOTE) KOH/Calcofluor preparation:  no fungus observed. Performed At: [facility] [address] [place], [name] [id number] Jennette Shorter MD Ey:[phone]          Radiology Studies: DG Chest Port 1 View Result Date: 04/18/2024 EXAM: 1 VIEW(S) XRAY OF THE CHEST 04/18/2024 09:54:00 AM COMPARISON: 2 days ago. CLINICAL HISTORY: Pneumomediastinum (HCC). FINDINGS: LUNGS AND PLEURA: Stable bilateral lung opacities, left greater than right, concerning for pneumonia. Stable small right pleural effusion. No pulmonary edema. No pneumothorax. HEART AND MEDIASTINUM: No acute abnormality of the cardiac and mediastinal silhouettes. BONES AND SOFT TISSUES: No acute osseous abnormality. IMPRESSION: 1. Stable bilateral pulmonary opacities, left greater than right, suspicious for pneumonia. 2. Stable small right pleural effusion. Electronically signed by: Lynwood Seip MD 04/18/2024 10:21 AM EST RP Workstation: [id number]   CT Angio Chest Pulmonary Embolism (PE) W or WO Contrast Result Date: 04/17/2024 CLINICAL DATA:  Hypoxia.  Concern for pulmonary. EXAM: CT ANGIOGRAPHY CHEST WITH CONTRAST TECHNIQUE: Multidetector CT imaging of the chest was performed using the standard protocol during bolus administration of intravenous contrast. Multiplanar CT image reconstructions and MIPs were obtained to evaluate the vascular anatomy. RADIATION DOSE REDUCTION: This exam was performed according to the departmental dose-optimization program which includes automated exposure control, adjustment of the mA and/or kV according to patient size and/or use of iterative reconstruction technique. CONTRAST:  75mL OMNIPAQUE  IOHEXOL  350 MG/ML SOLN COMPARISON:  Chest CT dated 04/07/2024. FINDINGS: Cardiovascular: Mild cardiomegaly. No  pericardial effusion. There is mild atherosclerotic calcification of the thoracic aorta. No intimal dilatation or dissection. No pulmonary artery embolus identified. Mediastinum/Nodes: Calcified hilar and mediastinal granuloma. The esophagus is grossly unremarkable. No mediastinal fluid collection. There is pneumomediastinum of indeterminate etiology. Right-sided Port-A-Cath with tip in the right atrium. Lungs/Pleura: Right lung base post treatment changes/scarring similar to prior CT. There is a small loculated right pleural effusion. Diffuse ground-glass density throughout the left lung similar to prior CT. Several scattered calcified granuloma noted. No pneumothorax. The central airways are patent. Upper Abdomen: No acute abnormality. Musculoskeletal: Osteopenia with degenerative changes of the spine. No acute osseous pathology. Review of  the MIP images confirms the above findings. IMPRESSION: 1. No CT evidence of pulmonary artery embolus. 2. Pneumomediastinum of indeterminate etiology. 3. No significant interval change in the appearance of the lungs since the prior CT. 4.  Aortic Atherosclerosis (ICD10-I70.0). These results will be called to the ordering clinician or representative by the Radiologist Assistant, and communication documented in the PACS or [employer]. Electronically Signed   By: Vanetta Chou M.D.   On: 04/17/2024 16:27        Scheduled Meds:  apixaban  5 mg Oral BID   budesonide -glycopyrrolate-formoterol  2 puff Inhalation BID   Chlorhexidine  Gluconate Cloth  6 each Topical Daily   docusate sodium   100 mg Oral BID   doxycycline   100 mg Oral Q12H   dronedarone   400 mg Oral BID WC   ezetimibe   10 mg Oral Daily   furosemide   40 mg Intravenous Daily   gabapentin   200 mg Oral QHS   glipiZIDE  2.5 mg Oral QAC breakfast   guaiFENesin   600 mg Oral BID   insulin aspart  0-15 Units Subcutaneous TID WC   levothyroxine   50 mcg Oral Q0600   [START ON 04/21/2024] minocycline  100  mg Oral Daily   montelukast   10 mg Oral QHS   predniSONE   30 mg Oral Q breakfast   rosuvastatin   20 mg Oral Daily   sodium chloride  flush  10-40 mL Intracatheter Q12H   Continuous Infusions:  cefTRIAXone  (ROCEPHIN )  IV 2 g (04/17/24 1736)          Derryl Duval, MD [facility] 04/18/2024, 2:41 PM

## 2024-04-19 DIAGNOSIS — J9601 Acute respiratory failure with hypoxia: Secondary | ICD-10-CM | POA: Diagnosis not present

## 2024-04-19 LAB — BASIC METABOLIC PANEL WITH GFR
Anion gap: 11 (ref 5–15)
BUN: 27 mg/dL — ABNORMAL HIGH (ref 8–23)
CO2: 32 mmol/L (ref 22–32)
Calcium: 7.6 mg/dL — ABNORMAL LOW (ref 8.9–10.3)
Chloride: 93 mmol/L — ABNORMAL LOW (ref 98–111)
Creatinine, Ser: 1.18 mg/dL (ref 0.61–1.24)
GFR, Estimated: >60 mL/min (ref >60)
Glucose, Bld: 78 mg/dL (ref 70–99)
Potassium: 3 mmol/L — ABNORMAL LOW (ref 3.5–5.1)
Sodium: 136 mmol/L (ref 135–145)

## 2024-04-19 LAB — GLUCOSE, CAPILLARY
Glucose-Capillary: 70 mg/dL (ref 70–99)
Glucose-Capillary: 77 mg/dL (ref 70–99)
Glucose-Capillary: 103 mg/dL — ABNORMAL HIGH (ref 70–99)
Glucose-Capillary: 252 mg/dL — ABNORMAL HIGH (ref 70–99)
Glucose-Capillary: 140 mg/dL — ABNORMAL HIGH (ref 70–99)

## 2024-04-19 LAB — CBC WITH DIFFERENTIAL/PLATELET
Abs Immature Granulocytes: 0.09 K/uL — ABNORMAL HIGH (ref 0.00–0.07)
Basophils Absolute: 0 K/uL (ref 0.0–0.1)
Basophils Relative: 0 %
Eosinophils Absolute: 0.1 K/uL (ref 0.0–0.5)
Eosinophils Relative: 0 %
HCT: 32.5 % — ABNORMAL LOW (ref 39.0–52.0)
Hemoglobin: 10.4 g/dL — ABNORMAL LOW (ref 13.0–17.0)
Immature Granulocytes: 1 %
Lymphocytes Relative: 1 %
Lymphs Abs: 0.2 K/uL — ABNORMAL LOW (ref 0.7–4.0)
MCH: 30 pg (ref 26.0–34.0)
MCHC: 32 g/dL (ref 30.0–36.0)
MCV: 93.7 fL (ref 80.0–100.0)
Monocytes Absolute: 0.9 K/uL (ref 0.1–1.0)
Monocytes Relative: 8 %
Neutro Abs: 10.6 K/uL — ABNORMAL HIGH (ref 1.7–7.7)
Neutrophils Relative %: 90 %
Platelets: 112 K/uL — ABNORMAL LOW (ref 150–400)
RBC: 3.47 MIL/uL — ABNORMAL LOW (ref 4.22–5.81)
RDW: 17 % — ABNORMAL HIGH (ref 11.5–15.5)
WBC: 11.8 K/uL — ABNORMAL HIGH (ref 4.0–10.5)
nRBC: 0 % (ref 0.0–0.2)

## 2024-04-19 MED ORDER — POTASSIUM CHLORIDE CRYS ER 20 MEQ PO TBCR
40.0000 meq | EXTENDED_RELEASE_TABLET | ORAL | Status: AC
  Administered 2024-04-19 (×3): 40 meq via ORAL
  Filled 2024-04-19 (×3): qty 2

## 2024-04-19 MED ORDER — PREDNISONE 5 MG PO TABS
15.0000 mg | ORAL_TABLET | Freq: Every day | ORAL | Status: DC
  Administered 2024-04-29: 15 mg via ORAL
  Filled 2024-04-19: qty 1

## 2024-04-19 MED ORDER — SODIUM CHLORIDE 0.9 % IV BOLUS
1000.0000 mL | Freq: Once | INTRAVENOUS | Status: AC
  Administered 2024-04-19: 1000 mL via INTRAVENOUS

## 2024-04-19 MED ORDER — PREDNISONE 20 MG PO TABS
30.0000 mg | ORAL_TABLET | Freq: Every day | ORAL | Status: AC
  Administered 2024-04-20 – 2024-04-21 (×2): 30 mg via ORAL
  Filled 2024-04-19 (×2): qty 1

## 2024-04-19 MED ORDER — PREDNISONE 20 MG PO TABS
20.0000 mg | ORAL_TABLET | Freq: Every day | ORAL | Status: AC
  Administered 2024-04-22 – 2024-04-28 (×7): 20 mg via ORAL
  Filled 2024-04-19 (×7): qty 1

## 2024-04-19 NOTE — Plan of Care (Signed)
   Problem: Education: Goal: Ability to describe self-care measures that may prevent or decrease complications (Diabetes Survival Skills Education) will improve Outcome: Progressing   Problem: Coping: Goal: Ability to adjust to condition or change in health will improve Outcome: Progressing   Problem: Fluid Volume: Goal: Ability to maintain a balanced intake and output will improve Outcome: Progressing

## 2024-04-19 NOTE — Progress Notes (Signed)
 04/19/2024 Notes/imaging/labs reviewed. Check AM CXR. Agree with break from lasix . Will try to check on tomorrow, let me know if any issues.  Rolan Sharps MD PCCM

## 2024-04-19 NOTE — Progress Notes (Signed)
 PROGRESS NOTE    Luis [name]  FMW:[id number] DOB: Feb 27, 1941 DOA: 04/07/2024 PCP: [name], MD   Brief Narrative:   Luis [name] is a 83 y.o. male with PMH of  metastatic colon cancer, mets to lungs, liver, LN's, sarcoidosis, COPD, CAD hx PCI, HFpEF, hypertension, hyperlipidemia, hypothyroidism, who presented with progressive shortness of breath onset x 1 wk  with  exertional dyspnea, chest congestion, minimally productive cough with clear and scant blood-tinged sputum, seen by pulmonology and was started on Augmentin  on Tuesday but by Thursday his symptoms started worsening and progressively worsened over the weekend.  He ended up checking his pulse oximeter at home which was reading in the high 60s so he presented to the ED  In the ED tachypneic, hypoxic,labs-mag low 1.5 hyperglycemic 291 lactic acidosis 3.3 Pro-Cal 1.5 leukocytosis 14.5, A1c 7.1, high sensitive troponin 29 proBNP 249 EKG sinus rhythm LAD LVH prolonged Qtc, situational up to 75% room air given 4 L nasal cannula Chest x-ray>> right lung base increased patchy airspace opacities and new left infrahilar airspace opacities concerning for multifocal pneumonia small right pleural effusion CT chest>> Postsurgical and radiation changes in the right lower lobe. Multifocal ground glass opacity with mosaic attenuation in the left lung, favoring mild multifocal infection/pneumonia, although pneumonitis related to immunotherapy is also possible.  Stable underlying sequela of sarcoidosis. Patient admitted for acute hypoxic respiratory failure with concern for multifocal pneumonia in the setting of metastatic colon cancer Home Eliquis continued, Lasix  trial given along with increased dose of prednisone  without significant improvement 10/3o Pulm consulted as patient dyspnea worsened on 10/30 10/31 s/p bronchoscopy,BAL sent for studies.culture-AFB smear negative culture no growth-PJP/fungal culture pending Patient continued on IV  diuretics pulmonary following 11/5: Brief episodes of hypoxia with need for NRB subsequently HFNC PCCM following back again 11/7: Repeat CTA negative for PE.  Pneumomediastinum noted.  Hypoxia is improving, unfortunately patient having bloody mucousy stools.  Was orthostatic in the evening.  Eliquis held.  GI consult placed 11/8  Subjective:  Patient seen and examined at the bedside.  He is sitting in his of the bed and reported not feeling well.  Unfortunately his oxygen cord was not found to the wall.  He was satting in the 80s.  He is also noted to have soft blood pressures.  He continues to report of blood in his stool.  Hemoglobin is slowly drifting down.  Remains stable from breathing standpoint.  Assessment & Plan:   Principal Problem:   Acute hypoxic respiratory failure (HCC) Active Problems:   Immunocompromised   Multifocal pneumonia  Acute respiratory failure with hypoxia  New left lung patchy GGO concerning for acute inflammation or infection-multifocal pneumonia versus pneumonitis Immunocompromise status in the setting of chemotherapy History of sarcoidosis on long-term steroid: CT chest:Multifocal GGO/mosaic attenuation  LL- suspect multifocal infection/pneumonia-? pneumonitis related to immunotherapy Reports being on steroid for last 2 weeks 20 mg of prednisone  here needing 4 L [place] Eliquis PTA-less likelihood of PE. TTE done- EF 70 to 75% G1 DD RV SF is normal RV size is normal. Blood culture NGTD Patient will continue on IV antibiotics aggressive bronchodilators Noticed to have ongoing leg edema, BNP 237-Home p.o. diuretics changed to IV Initially felt some better again had worsening dyspnea 10/30 still needing 4 L nasal cannula>PCCM consulted  S/P bronchoscopy BAL 10/31 - BAL culture-no growth to date AFB fungus PJP DFA- pending.  Cytology-atypical cells. Lasix  increased to 80 mg Q6 on 11/3> and now on bid  dosing , has ongoing leg edema/ hypervolemia Continue  ceftriaxone , doxycycline  x 14 days Continue prednisone  30 mg x 7 days then 20 x 7 days, 15 x 7 days, 10 x 7 days Patient has more oxygen requirement and gets short of breath on minimal activity-VBG 11/5 with pCO2 50 pH 7.4, CXR11/5: Increased LL opacities concerning for worsening pneumonia or asymmetric edema stable right basilar opacity Pulmonary continues to follow-chest CTA 11/7 negative for PE, similar pulmonary infiltrates.  Pneumomediastinum noted.  Lasix  cut back to daily.  Pneumomediastinum: Will monitor closely.  Chest x-ray 11/7 shows no pneumothorax  GI bleeding: Patient with history of colon cancer, recurrent intermittent GI bleeding issues in the past.  Will consult GI.  Slow drifting of hemoglobin noted.  Eliquis will be held.  CT abdomen pelvis with contrast ordered.  BP 90s noted today.  Will order 1 L fluid bolus, hold Lasix   Mild lactic acidosis due to respiratory distress   Acute on chronic HFpEF Chronic leg edema Hypokalemia: Lasix  cut back to 40 mg IV daily however this will be discontinued today due to orthostasis and GI bleeding. Continue salt restrictions, daily weight intake and output monitoring   Elevated troponin: suspect demand ischemia with flat troponin. TTE is unremarkable   Hypokalemia Hypomagnesemia: Resolved   New onset type 2 diabetes with uncontrolled hyperglycemia: A1c at 7.1.  Initially cbg ~ 400-diet changed to carb controlled,started on glipizide and metformin ( will hold for now) cont ssi., Blood sugar fairly controlled.  Suspect in the setting of his steroid use as well  Metastatic colon cancer, R hemicolectomy, mets to lungs, liver, LN's: Followed by [facility], has known RUL nodule and opacities in the R base ? Lymphangitic spread v. Inflammation/infection (last in 6/'25). He is currently on maintenance 5FU and Panitumumab  every 2 weeks. Concern for immunotherapy mediated pneumonitis, patient on a steroid already as per pulmonary see  above   Sarcoidosis: Follows with pulmonology, appears that he is currently on chronic steroids    pHTN: Suggested on imaging, noted    HTN HLD CAD hx PCI: Bp soft today. No chest pain. Cont his DOAC, rosuvastatin , zetia . Recently off Ranolazine     Aflutter: Rate controlled. Cont home Dronedarone  .  Holding Eliquis due to GI bleeding   Aortic ectasia: OP surveillance    Hypothyroidism: Continue home levothyroxine     Chronic anemia:  Stable at baseline.B12 and folate normal.   Thrombocytopenia: Monitor platelet count, question if from chemo   ?Chronic pain: ?Rosacea: continue home Minocycline-after completing antibiotics, continue gabapentin .    Class  I Obesity w/ Body mass index is 29.86 kg/m.: will benefit with PCP follow-up, weight loss,healthy lifestyle and outpatient sleep eval if not done.   PT Orders: Active PT Follow up Rec: Outpatient Pt (Pulmonary Rehab)04/17/2024 1000     DVT prophylaxis: Eliquis Code Status:   Code Status: Prior Family Communication: plan of care discussed with patient at bedside.    Patient status is: Remains hospitalized because of severity of illness Level of care: Telemetry Medical    Dispo: The patient is from: home.            Anticipated disposition: TBD.   Objective: Vitals:   04/19/24 0747 04/19/24 0817 04/19/24 0859 04/19/24 1132  BP: (!) 94/58 (!) 94/48  99/65  Pulse:  71  89  Resp:  18  18  Temp:    97.8 F (36.6 C)  TempSrc:    Oral  SpO2:  92% 92% 92%  Weight:  Height:        Intake/Output Summary (Last 24 hours) at 04/19/2024 1214 Last data filed at 04/19/2024 0550 Gross per 24 hour  Intake 120 ml  Output 875 ml  Net -755 ml   Filed Weights   04/15/24 0231 04/16/24 0410 04/19/24 0442  Weight: 98 kg 97.1 kg 91 kg    Examination:  General: Alert and oriented not in any acute distress HEENT: Moist oral mucosa Chest: Bilateral crackles, no wheezing CVS: S1, S2, no murmur, regular rhythm Abdomen:  Soft, nontender Extremities: Bilateral lower extremity edema   Data Reviewed: I have personally reviewed following labs and imaging studies  CBC: Recent Labs  Lab 04/14/24 1039 04/16/24 1013 04/18/24 0312 04/18/24 2057 04/19/24 0325  WBC 15.5* 17.9* 13.0*  --  11.8*  NEUTROABS  --   --   --   --  10.6*  HGB 12.7* 12.4* 11.9* 11.1* 10.4*  HCT 39.9 38.6* 37.0* 34.9* 32.5*  MCV 95.7 93.7 93.9  --  93.7  PLT 167 149* 143*  --  112*   Basic Metabolic Panel: Recent Labs  Lab 04/15/24 0538 04/16/24 0504 04/17/24 0430 04/18/24 0312 04/19/24 0325  NA 137 138 139 134* 136  K 3.8 3.8 3.1* 3.6 3.0*  CL 95* 94* 93* 92* 93*  CO2 31 29 32 30 32  GLUCOSE 90 110* 86 70 78  BUN 23 28* 27* 26* 27*  CREATININE 0.95 1.10 1.23 1.35* 1.18  CALCIUM  8.5* 8.2* 7.9* 7.7* 7.6*   GFR: Estimated Creatinine Clearance: 54.7 mL/min (by C-G formula based on SCr of 1.18 mg/dL). Liver Function Tests: No results for input(s): AST, ALT, ALKPHOS, BILITOT, PROT, ALBUMIN in the last 168 hours. No results for input(s): LIPASE, AMYLASE in the last 168 hours. No results for input(s): AMMONIA in the last 168 hours. Coagulation Profile: No results for input(s): INR, PROTIME in the last 168 hours. Cardiac Enzymes: No results for input(s): CKTOTAL, CKMB, CKMBINDEX, TROPONINI in the last 168 hours. BNP (last 3 results) Recent Labs    01/02/24 1445  PROBNP 221.0*   HbA1C: No results for input(s): HGBA1C in the last 72 hours. CBG: Recent Labs  Lab 04/18/24 1756 04/18/24 2205 04/19/24 0658 04/19/24 0742 04/19/24 1133  GLUCAP 131* 78 70 77 103*   Lipid Profile: No results for input(s): CHOL, HDL, LDLCALC, TRIG, CHOLHDL, LDLDIRECT in the last 72 hours. Thyroid Function Tests: No results for input(s): TSH, T4TOTAL, FREET4, T3FREE, THYROIDAB in the last 72 hours. Anemia Panel: No results for input(s): VITAMINB12, FOLATE, FERRITIN, TIBC,  IRON, RETICCTPCT in the last 72 hours. Sepsis Labs: Recent Labs  Lab 04/16/24 0857 04/17/24 1108  LATICACIDVEN 2.2* 3.0*    Recent Results (from the past 240 hours)  Expectorated Sputum Assessment w Gram Stain, Rflx to Resp Cult     Status: None   Collection Time: 04/10/24  2:46 PM   Specimen: Expectorated Sputum  Result Value Ref Range Status   Specimen Description EXPECTORATED SPUTUM  Final   Special Requests NONE  Final   Sputum evaluation   Final    Sputum specimen not acceptable for testing.  Please recollect.   Gram Stain Report Called to,Read Back By and Verified With: RN MYRTIS GLEASON [phone] @ [phone] FH Performed at [facility], [address], [place], [name] [address]    Report Status 04/10/2024 FINAL  Final  Fungus Culture With Stain     Status: None (Preliminary result)   Collection Time: 04/11/24  1:07 PM  Specimen: Bronchial Alveolar Lavage; Respiratory  Result Value Ref Range Status   Fungus Stain Final report  Final    Comment: (NOTE) Performed At: [facility] [address] [place], [name] [id number] Jennette Shorter MD Ey:[phone]    Fungus (Mycology) Culture PENDING  Incomplete   Fungal Source BRONCHIAL ALVEOLAR LAVAGE  Final    Comment: Performed at [facility], [address], [place], [name] [address]  Culture, BAL-quantitative w Gram Stain     Status: None   Collection Time: 04/11/24  1:07 PM   Specimen: Bronchial Alveolar Lavage; Respiratory  Result Value Ref Range Status   Specimen Description BRONCHIAL ALVEOLAR LAVAGE  Final   Special Requests LUL  Final   Gram Stain   Final    FEW WBC PRESENT, PREDOMINANTLY PMN NO ORGANISMS SEEN    Culture   Final    NO GROWTH Performed at [facility], [address], [place], [name] [address]    Report Status 04/13/2024 FINAL  Final  Acid Fast Smear (AFB)     Status: None   Collection Time: 04/11/24  1:07 PM   Specimen: Bronchial Alveolar Lavage; Respiratory  Result  Value Ref Range Status   AFB Specimen Processing Concentration  Final   Acid Fast Smear Negative  Final    Comment: (NOTE) Performed At: [facility] [address] [place], [name] [id number] Jennette Shorter MD Ey:[phone]    Source (AFB) BRONCHIAL ALVEOLAR LAVAGE  Final    Comment: Performed at [facility], [address], [place], [name] [address]  Fungus Culture Result     Status: None   Collection Time: 04/11/24  1:07 PM  Result Value Ref Range Status   Result 1 Comment  Final    Comment: (NOTE) KOH/Calcofluor preparation:  no fungus observed. Performed At: [facility] [address] [place], [name] [id number] Jennette Shorter MD Ey:[phone]          Radiology Studies: DG Chest Port 1 View Result Date: 04/18/2024 EXAM: 1 VIEW(S) XRAY OF THE CHEST 04/18/2024 09:54:00 AM COMPARISON: 2 days ago. CLINICAL HISTORY: Pneumomediastinum (HCC). FINDINGS: LUNGS AND PLEURA: Stable bilateral lung opacities, left greater than right, concerning for pneumonia. Stable small right pleural effusion. No pulmonary edema. No pneumothorax. HEART AND MEDIASTINUM: No acute abnormality of the cardiac and mediastinal silhouettes. BONES AND SOFT TISSUES: No acute osseous abnormality. IMPRESSION: 1. Stable bilateral pulmonary opacities, left greater than right, suspicious for pneumonia. 2. Stable small right pleural effusion. Electronically signed by: Lynwood Seip MD 04/18/2024 10:21 AM EST RP Workstation: [id number]   CT Angio Chest Pulmonary Embolism (PE) W or WO Contrast Result Date: 04/17/2024 CLINICAL DATA:  Hypoxia.  Concern for pulmonary. EXAM: CT ANGIOGRAPHY CHEST WITH CONTRAST TECHNIQUE: Multidetector CT imaging of the chest was performed using the standard protocol during bolus administration of intravenous contrast. Multiplanar CT image reconstructions and MIPs were obtained to evaluate the vascular anatomy. RADIATION DOSE REDUCTION: This exam was performed according to  the departmental dose-optimization program which includes automated exposure control, adjustment of the mA and/or kV according to patient size and/or use of iterative reconstruction technique. CONTRAST:  75mL OMNIPAQUE  IOHEXOL  350 MG/ML SOLN COMPARISON:  Chest CT dated 04/07/2024. FINDINGS: Cardiovascular: Mild cardiomegaly. No pericardial effusion. There is mild atherosclerotic calcification of the thoracic aorta. No intimal dilatation or dissection. No pulmonary artery embolus identified. Mediastinum/Nodes: Calcified hilar and mediastinal granuloma. The esophagus is grossly unremarkable. No mediastinal fluid collection. There is pneumomediastinum of indeterminate etiology. Right-sided  Port-A-Cath with tip in the right atrium. Lungs/Pleura: Right lung base post treatment changes/scarring similar to prior CT. There is a small loculated right pleural effusion. Diffuse ground-glass density throughout the left lung similar to prior CT. Several scattered calcified granuloma noted. No pneumothorax. The central airways are patent. Upper Abdomen: No acute abnormality. Musculoskeletal: Osteopenia with degenerative changes of the spine. No acute osseous pathology. Review of the MIP images confirms the above findings. IMPRESSION: 1. No CT evidence of pulmonary artery embolus. 2. Pneumomediastinum of indeterminate etiology. 3. No significant interval change in the appearance of the lungs since the prior CT. 4.  Aortic Atherosclerosis (ICD10-I70.0). These results will be called to the ordering clinician or representative by the Radiologist Assistant, and communication documented in the PACS or [employer]. Electronically Signed   By: Vanetta Chou M.D.   On: 04/17/2024 16:27        Scheduled Meds:  budesonide -glycopyrrolate-formoterol  2 puff Inhalation BID   Chlorhexidine  Gluconate Cloth  6 each Topical Daily   docusate sodium   100 mg Oral BID   doxycycline   100 mg Oral Q12H   dronedarone   400 mg Oral BID  WC   ezetimibe   10 mg Oral Daily   gabapentin   200 mg Oral QHS   glipiZIDE  2.5 mg Oral QAC breakfast   guaiFENesin   600 mg Oral BID   insulin aspart  0-15 Units Subcutaneous TID WC   levothyroxine   50 mcg Oral Q0600   [START ON 04/21/2024] minocycline  100 mg Oral Daily   montelukast   10 mg Oral QHS   pantoprazole (PROTONIX) IV  40 mg Intravenous Q12H   potassium chloride   40 mEq Oral Q4H   [START ON 04/20/2024] predniSONE   30 mg Oral Q breakfast   Followed by   NOREEN ON 04/22/2024] predniSONE   20 mg Oral Q breakfast   Followed by   NOREEN ON 04/29/2024] predniSONE   15 mg Oral Q breakfast   rosuvastatin   20 mg Oral Daily   sodium chloride  flush  10-40 mL Intracatheter Q12H   Continuous Infusions:  cefTRIAXone  (ROCEPHIN )  IV 2 g (04/18/24 1732)          Arvell Pulsifer, MD [facility] 04/19/2024, 12:14 PM

## 2024-04-20 ENCOUNTER — Inpatient Hospital Stay (HOSPITAL_COMMUNITY)

## 2024-04-20 DIAGNOSIS — J188 Other pneumonia, unspecified organism: Secondary | ICD-10-CM

## 2024-04-20 DIAGNOSIS — I5030 Unspecified diastolic (congestive) heart failure: Secondary | ICD-10-CM

## 2024-04-20 DIAGNOSIS — K649 Unspecified hemorrhoids: Secondary | ICD-10-CM

## 2024-04-20 DIAGNOSIS — J45909 Unspecified asthma, uncomplicated: Secondary | ICD-10-CM | POA: Diagnosis not present

## 2024-04-20 DIAGNOSIS — C189 Malignant neoplasm of colon, unspecified: Secondary | ICD-10-CM | POA: Diagnosis not present

## 2024-04-20 DIAGNOSIS — K769 Liver disease, unspecified: Secondary | ICD-10-CM | POA: Diagnosis not present

## 2024-04-20 DIAGNOSIS — J9601 Acute respiratory failure with hypoxia: Secondary | ICD-10-CM | POA: Diagnosis not present

## 2024-04-20 DIAGNOSIS — J9691 Respiratory failure, unspecified with hypoxia: Secondary | ICD-10-CM

## 2024-04-20 DIAGNOSIS — D869 Sarcoidosis, unspecified: Secondary | ICD-10-CM

## 2024-04-20 DIAGNOSIS — K625 Hemorrhage of anus and rectum: Secondary | ICD-10-CM | POA: Diagnosis not present

## 2024-04-20 DIAGNOSIS — J189 Pneumonia, unspecified organism: Secondary | ICD-10-CM | POA: Diagnosis not present

## 2024-04-20 DIAGNOSIS — D1771 Benign lipomatous neoplasm of kidney: Secondary | ICD-10-CM | POA: Diagnosis not present

## 2024-04-20 DIAGNOSIS — K5730 Diverticulosis of large intestine without perforation or abscess without bleeding: Secondary | ICD-10-CM | POA: Diagnosis not present

## 2024-04-20 DIAGNOSIS — K648 Other hemorrhoids: Secondary | ICD-10-CM

## 2024-04-20 DIAGNOSIS — R918 Other nonspecific abnormal finding of lung field: Secondary | ICD-10-CM | POA: Diagnosis not present

## 2024-04-20 DIAGNOSIS — C787 Secondary malignant neoplasm of liver and intrahepatic bile duct: Secondary | ICD-10-CM

## 2024-04-20 DIAGNOSIS — J982 Interstitial emphysema: Secondary | ICD-10-CM

## 2024-04-20 LAB — CBC WITH DIFFERENTIAL/PLATELET
Abs Immature Granulocytes: 0.07 K/uL (ref 0.00–0.07)
Basophils Absolute: 0 K/uL (ref 0.0–0.1)
Basophils Relative: 0 %
Eosinophils Absolute: 0 K/uL (ref 0.0–0.5)
Eosinophils Relative: 0 %
HCT: 33 % — ABNORMAL LOW (ref 39.0–52.0)
Hemoglobin: 10.6 g/dL — ABNORMAL LOW (ref 13.0–17.0)
Immature Granulocytes: 1 %
Lymphocytes Relative: 2 %
Lymphs Abs: 0.2 K/uL — ABNORMAL LOW (ref 0.7–4.0)
MCH: 30.2 pg (ref 26.0–34.0)
MCHC: 32.1 g/dL (ref 30.0–36.0)
MCV: 94 fL (ref 80.0–100.0)
Monocytes Absolute: 0.8 K/uL (ref 0.1–1.0)
Monocytes Relative: 7 %
Neutro Abs: 10.8 K/uL — ABNORMAL HIGH (ref 1.7–7.7)
Neutrophils Relative %: 90 %
Platelets: 107 K/uL — ABNORMAL LOW (ref 150–400)
RBC: 3.51 MIL/uL — ABNORMAL LOW (ref 4.22–5.81)
RDW: 17.1 % — ABNORMAL HIGH (ref 11.5–15.5)
WBC: 11.9 K/uL — ABNORMAL HIGH (ref 4.0–10.5)
nRBC: 0 % (ref 0.0–0.2)

## 2024-04-20 LAB — CBC
HCT: 34.9 % — ABNORMAL LOW (ref 39.0–52.0)
Hemoglobin: 11 g/dL — ABNORMAL LOW (ref 13.0–17.0)
MCH: 30.7 pg (ref 26.0–34.0)
MCHC: 31.5 g/dL (ref 30.0–36.0)
MCV: 97.5 fL (ref 80.0–100.0)
Platelets: 123 K/uL — ABNORMAL LOW (ref 150–400)
RBC: 3.58 MIL/uL — ABNORMAL LOW (ref 4.22–5.81)
RDW: 17 % — ABNORMAL HIGH (ref 11.5–15.5)
WBC: 16.1 K/uL — ABNORMAL HIGH (ref 4.0–10.5)
nRBC: 0 % (ref 0.0–0.2)

## 2024-04-20 LAB — COMPREHENSIVE METABOLIC PANEL WITH GFR
ALT: 121 U/L — ABNORMAL HIGH (ref 0–44)
AST: 151 U/L — ABNORMAL HIGH (ref 15–41)
Albumin: 2.1 g/dL — ABNORMAL LOW (ref 3.5–5.0)
Alkaline Phosphatase: 648 U/L — ABNORMAL HIGH (ref 38–126)
Anion gap: 12 (ref 5–15)
BUN: 22 mg/dL (ref 8–23)
CO2: 23 mmol/L (ref 22–32)
Calcium: 7.6 mg/dL — ABNORMAL LOW (ref 8.9–10.3)
Chloride: 98 mmol/L (ref 98–111)
Creatinine, Ser: 1.13 mg/dL (ref 0.61–1.24)
GFR, Estimated: >60 mL/min (ref >60)
Glucose, Bld: 244 mg/dL — ABNORMAL HIGH (ref 70–99)
Potassium: 5.5 mmol/L — ABNORMAL HIGH (ref 3.5–5.1)
Sodium: 133 mmol/L — ABNORMAL LOW (ref 135–145)
Total Bilirubin: 1.3 mg/dL — ABNORMAL HIGH (ref 0.0–1.2)
Total Protein: 4.5 g/dL — ABNORMAL LOW (ref 6.5–8.1)

## 2024-04-20 LAB — IRON AND TIBC
Iron: 41 ug/dL — ABNORMAL LOW (ref 45–182)
Saturation Ratios: 18 % (ref 17.9–39.5)
TIBC: 230 ug/dL — ABNORMAL LOW (ref 250–450)
UIBC: 189 ug/dL

## 2024-04-20 LAB — TECHNOLOGIST SMEAR REVIEW: Plt Morphology: DECREASED

## 2024-04-20 LAB — GLUCOSE, CAPILLARY
Glucose-Capillary: 64 mg/dL — ABNORMAL LOW (ref 70–99)
Glucose-Capillary: 79 mg/dL (ref 70–99)
Glucose-Capillary: 120 mg/dL — ABNORMAL HIGH (ref 70–99)
Glucose-Capillary: 167 mg/dL — ABNORMAL HIGH (ref 70–99)
Glucose-Capillary: 232 mg/dL — ABNORMAL HIGH (ref 70–99)
Glucose-Capillary: 204 mg/dL — ABNORMAL HIGH (ref 70–99)

## 2024-04-20 LAB — TYPE AND SCREEN
ABO/RH(D): O POS
Antibody Screen: NEGATIVE

## 2024-04-20 LAB — BASIC METABOLIC PANEL WITH GFR
Anion gap: 11 (ref 5–15)
BUN: 23 mg/dL (ref 8–23)
CO2: 28 mmol/L (ref 22–32)
Calcium: 8 mg/dL — ABNORMAL LOW (ref 8.9–10.3)
Chloride: 96 mmol/L — ABNORMAL LOW (ref 98–111)
Creatinine, Ser: 1.05 mg/dL (ref 0.61–1.24)
GFR, Estimated: >60 mL/min (ref >60)
Glucose, Bld: 76 mg/dL (ref 70–99)
Potassium: 4.3 mmol/L (ref 3.5–5.1)
Sodium: 135 mmol/L (ref 135–145)

## 2024-04-20 LAB — DIFFERENTIAL
Abs Immature Granulocytes: 0.09 K/uL — ABNORMAL HIGH (ref 0.00–0.07)
Basophils Absolute: 0 K/uL (ref 0.0–0.1)
Basophils Relative: 0 %
Eosinophils Absolute: 0 K/uL (ref 0.0–0.5)
Eosinophils Relative: 0 %
Immature Granulocytes: 1 %
Lymphocytes Relative: 1 %
Lymphs Abs: 0.1 K/uL — ABNORMAL LOW (ref 0.7–4.0)
Monocytes Absolute: 0.4 K/uL (ref 0.1–1.0)
Monocytes Relative: 2 %
Neutro Abs: 15.4 K/uL — ABNORMAL HIGH (ref 1.7–7.7)
Neutrophils Relative %: 96 %

## 2024-04-20 LAB — SEDIMENTATION RATE: Sed Rate: 23 mm/h — ABNORMAL HIGH (ref 0–16)

## 2024-04-20 LAB — LACTIC ACID, PLASMA
Lactic Acid, Venous: 2 mmol/L (ref 0.5–1.9)
Lactic Acid, Venous: 1.8 mmol/L (ref 0.5–1.9)

## 2024-04-20 LAB — RETICULOCYTES
Immature Retic Fract: 23.8 % — ABNORMAL HIGH (ref 2.3–15.9)
RBC.: 3.68 MIL/uL — ABNORMAL LOW (ref 4.22–5.81)
Retic Count, Absolute: 66.2 K/uL (ref 19.0–186.0)
Retic Ct Pct: 1.8 % (ref 0.4–3.1)

## 2024-04-20 MED ORDER — APIXABAN 5 MG PO TABS: 5.0000 mg | ORAL_TABLET | Freq: Two times a day (BID) | ORAL | Status: DC

## 2024-04-20 MED ORDER — IOHEXOL 350 MG/ML SOLN
75.0000 mL | Freq: Once | INTRAVENOUS | Status: AC | PRN
Start: 2024-04-20 — End: 2024-04-20
  Administered 2024-04-20: 75 mL via INTRAVENOUS

## 2024-04-20 MED ORDER — MIDODRINE HCL 5 MG PO TABS
10.0000 mg | ORAL_TABLET | Freq: Three times a day (TID) | ORAL | Status: DC
  Administered 2024-04-20 – 2024-05-01 (×28): 10 mg via ORAL
  Filled 2024-04-20 (×31): qty 2

## 2024-04-20 MED ORDER — SUCRALFATE 1 GM/10ML PO SUSP
1.0000 g | Freq: Two times a day (BID) | ORAL | Status: DC
  Administered 2024-04-20 – 2024-05-09 (×39): 1 g via ORAL
  Filled 2024-04-20 (×40): qty 10

## 2024-04-20 MED ORDER — APIXABAN 5 MG PO TABS
5.0000 mg | ORAL_TABLET | Freq: Two times a day (BID) | ORAL | Status: DC
  Administered 2024-04-21 – 2024-05-09 (×37): 5 mg via ORAL
  Filled 2024-04-20 (×2): qty 2
  Filled 2024-04-20 (×2): qty 1
  Filled 2024-04-20 (×2): qty 2
  Filled 2024-04-20: qty 1
  Filled 2024-04-20 (×4): qty 2
  Filled 2024-04-20: qty 1
  Filled 2024-04-20: qty 2
  Filled 2024-04-20: qty 1
  Filled 2024-04-20 (×5): qty 2
  Filled 2024-04-20: qty 1
  Filled 2024-04-20 (×7): qty 2
  Filled 2024-04-20: qty 1
  Filled 2024-04-20: qty 2
  Filled 2024-04-20 (×2): qty 1
  Filled 2024-04-20 (×3): qty 2
  Filled 2024-04-20 (×2): qty 1
  Filled 2024-04-20 (×2): qty 2

## 2024-04-20 NOTE — Progress Notes (Signed)
 NAME:  Luis [name], MRN:  [id number], DOB:  July 27, 1940, LOS: 13 ADMISSION DATE:  04/07/2024, CONSULTATION DATE:  10/30 REFERRING MD:  [facility], CHIEF COMPLAINT:  acute resp failure   History of Present Illness:  Luis [name] is an 83 y/o gentleman with a history of pulmonary sarcoidosis on chronic steriods, COPD-asthma overlap on triple inhaled therapy & Dupixent , stage IV cecal adenocarcinoma being treated at [facility] since 2019 who presented to the hospital with progressive shortness of breath and dry cough over 4 days on 04/07/2024.  He had been seen in pulmonary clinic today following his symptoms starting on 7/23.  He had an exacerbation of his respiratory disease in March and September of this year.  Since his exacerbation in September he had gone back on prednisone , tapering from 30 mg to 20 mg to 10 mg daily.  When he got back down to 10 mg daily dose he felt his symptoms flared up again, similar to tapers done in the 1990s when he was on chronic steroids for sarcoidosis.  He has only been on intermittent steroids since 1990s; he has not been on steroid sparing DMARD therapy due to quiescent sarcoidosis over the years.  He has not had sick contacts recently.  No new symptoms other than some leg swelling consistent with hospital.  No fevers.  He has had green sputum production in the hospital, but did not have sputum at home.  He is not on home oxygen.  He last had chemotherapy 2 weeks ago-pembrolizumab and 5-fluorouracil .  He felt well after this for a few days before starting to feel run down.  His meds are not to take primary support.  He is on Eliquis for DVT diagnosed in late July 2025 & Afib.  Patient hospital because required 4 to 6 L nasal cannula.  He has been maintained on azithromycin , ceftriaxone , prednisone  40mg  daily. Overall he felt a little better yesterday but is more dyspneic again today.   Pertinent  Medical History  Afib, recent cardioversion Metastatic adenocarcinoma of  cecum Pulmonary sarcoidosis diagnosed by open lung biopsy in 1990s COPD asthma overlap Coronary artery disease HTN HLD  Significant Hospital Events: Including procedures, antibiotic start and stop dates in addition to other pertinent events   10/26 admitted to the hospital, started on ceftriaxone  and azithromycin , prednisone  40 mg daily 10/29 completed azithromycin  10/30 stopped ceftriaxone  11/5 desaturation at 0400 overnight, transition to 4 L/min.  Then up to 10 L/min when he was moved to chair.  Now weaned to 6 L/min 11/6 CT-PA chest >> no PE, calcified hilar and mediastinal nodes, new mediastinal air without any evidence for pneumothorax.  Right lung base infiltrate unchanged, some ground glass on the left unchanged  Interim History / Subjective:  Overall thinks he feels a little better. Had orthostasis with walking, has not tried again. Coughing up plugs and clear mucus similar to prior.  Objective    Blood pressure (!) 103/46, pulse 84, temperature 97.9 F (36.6 C), temperature source Oral, resp. rate 18, height 5' 11 (1.803 m), weight 93.4 kg, SpO2 96%.        Intake/Output Summary (Last 24 hours) at 04/20/2024 1500 Last data filed at 04/20/2024 0350 Gross per 24 hour  Intake 120 ml  Output 500 ml  Net -380 ml   Filed Weights   04/16/24 0410 04/19/24 0442 04/20/24 0228  Weight: 97.1 kg 91 kg 93.4 kg    Examination: No distress, chronically ill Crackles, mild tachypnea but no accessory muscle use  A little weak Abd soft  Labs imaging reviewed  Resolved problem list   Assessment and Plan   Community-acquired pneumonia: Patchy infiltrates ground glass on the left, right basilar consolidation superimposed on some old scar consistent with CAP.  BAL with greater than 50% neutrophils.  Concerning for suppurative, bacterial infection.  Cultures are negative, likely due to preceding antibiotics -Complete 14-day course ceftriaxone  - I-S, pulmonary  hygiene  Pneumomediastinum, new Stable on imaging  Multifactorial hypoxemic respiratory failure due to right lower lobe CAP, sarcoidosis with associated asthma, component of volume overload Chronic effusion stable Has some dysphagia so will check a esophagram esp in context of pneumomediastinum although suspect it was related to bronch  HFpEF, lower extremity swelling: Likely due to fluid administration this hospitalization.  Possibly prolonging hypoxemia. -Has tolerated diuresis, serum creatinine beginning to bump.  Will decrease his Lasix  to 40 mg IV daily today, can likely transition back to his home regimen soon -Follow BMP  Asthma:  -Home regimen of Dupixent , Singulair , Breztri , albuterol .  Would be reasonable for him to have his Dupixent  (he is due).  He could bring in his home med for administration here -Plan for prednisone  taper off as an outpatient: Prednisone  taper to start on discharge: Prednisone  30 mg daily for 7 days, Prednisone  20 mg daily for 7 days, Prednisone  15 mg daily for 7 days, Prednisone  10 mg daily for 7 days, Prednisone  5 mg daily for 7 days, then stop- ordered  Hemorrhoidal bleed- per GI  Will touch base with primary tomorrow, query if we are getting close to DC if can tolerate walking.  Rolan Sharps MD PCCM

## 2024-04-20 NOTE — Plan of Care (Signed)
  Problem: Education: Goal: Ability to describe self-care measures that may prevent or decrease complications (Diabetes Survival Skills Education) will improve 04/20/2024 1939 by Ann Nena HERO, RN Outcome: Progressing 04/20/2024 1938 by Ann Nena HERO, RN Outcome: Progressing   Problem: Coping: Goal: Ability to adjust to condition or change in health will improve 04/20/2024 1939 by Ann Nena HERO, RN Outcome: Progressing 04/20/2024 1938 by Ann Nena HERO, RN Outcome: Progressing   Problem: Fluid Volume: Goal: Ability to maintain a balanced intake and output will improve 04/20/2024 1939 by Ann Nena HERO, RN Outcome: Progressing 04/20/2024 1938 by Ann Nena HERO, RN Outcome: Progressing

## 2024-04-20 NOTE — Plan of Care (Deleted)
   Problem: Education: Goal: Ability to describe self-care measures that may prevent or decrease complications (Diabetes Survival Skills Education) will improve Outcome: Progressing   Problem: Coping: Goal: Ability to adjust to condition or change in health will improve Outcome: Progressing   Problem: Fluid Volume: Goal: Ability to maintain a balanced intake and output will improve Outcome: Progressing

## 2024-04-20 NOTE — Progress Notes (Signed)
 [facility] Progress Note Patient: Luis [name] FMW:[id number] DOB: 08/29/1940  DOA: 04/07/2024 DOS: the patient was seen and examined on 04/20/2024  Brief Hospital Course: ZYAN COBY is a 83 y.o. male with PMH of metastatic colon cancer, mets to lungs, liver, LN's, sarcoidosis, COPD, CAD hx PCI, HFpEF, hypertension, hyperlipidemia, hypothyroidism, who presented with progressive shortness of breath onset x 1 wk with exertional dyspnea, chest congestion, minimally productive cough with clear and scant blood-tinged sputum,  Currently being treated for pneumonia versus pneumonitis with acute hypoxic respiratory failure and acute on chronic HFpEF.  Assessment and Plan: Acute respiratory failure with hypoxia  New left lung patchy GGO concerning for acute inflammation or infection-multifocal pneumonia versus pneumonitis Immunocompromise status in the setting of chemotherapy History of sarcoidosis on long-term steroid: CT chest:Multifocal GGO/mosaic attenuation  LL- suspect multifocal infection/pneumonia-? pneumonitis related to immunotherapy Reports being on steroid for last 2 weeks 20 mg of prednisone  here needing 4 L [place] ON Eliquis PTA-less likelihood of PE. TTE done- EF 70 to 75% G1 DD RV SF is normal RV size is normal. Blood culture NGTD Currently on IV ceftriaxone  and oral doxycycline . Also on bronchodilators and steroids. S/P bronchoscopy BAL 10/31 - BAL culture-no growth to date AFB fungus PJP DFA- pending.  Cytology-atypical cells. Was on diuresis but due to hypotension currently on hold. Started on prednisone  taper.   Pneumomediastinum:  Will monitor closely.   Mild lactic acidosis due to respiratory distress   Acute on chronic HFpEF Chronic leg edema Hypokalemia: weight a week ago was 216 pound He has been intentionally losing weight before that he was 240.Weight at 219 pound on admission now 212>215> 205> 216>214 and has ongoing leg edema  Was on Lasix .  Now on hold  due to hypotension.   Elevated troponin: suspect demand ischemia with flat troponin. TTE is unremarkable   Hypokalemia Hypomagnesemia: Resolved   New onset type 2 diabetes with uncontrolled hyperglycemia: A1c at 7.1.  Initially cbg ~ 400-diet changed to carb controlled,started on glipizide and metformin ( will hold for now) cont ssi., Blood sugar fairly controlled.  Suspect in the setting of his steroid use as well   Metastatic colon cancer, R hemicolectomy, mets to lungs, liver, LN's: Followed by [facility], has known RUL nodule and opacities in the R base ? Lymphangitic spread v. Inflammation/infection (last in 6/'25). He is currently on maintenance 5FU and Panitumumab  every 2 weeks. Concern for immunotherapy mediated pneumonitis, patient on a steroid already as per pulmonary see above   Sarcoidosis: Follows with pulmonology, appears that he is currently on chronic steroids    pHTN: Suggested on imaging, noted    HTN HLD CAD hx PCI: Now with hypotension A-flutter. LFT elevation. Started on midodrine due to hypotension. Holding statin due to elevated LFT. If no improvement will have to stop dronedarone    Aflutter: Rate controlled. Cont home Apixaban, Dronedarone     Aortic ectasia: OP surveillance    Hypothyroidism: Continue home levothyroxine     Chronic anemia:  Stable at baseline.B12 and folate normal.   Thrombocytopenia: Monitor platelet count, question if from chemo   ?Chronic pain: ?Rosacea: continue home Minocycline-after completing antibiotics, continue gabapentin .    Class  I Obesity w/ Body mass index is 29.86 kg/m.: will benefit with PCP follow-up, weight loss,healthy lifestyle and outpatient sleep eval if not done  Concern for GI bleed. Internal hemorrhoids. Has some blood in the stool. GI consulted. No endoscopic evaluation. Okay to resume DOAC's. CT angiography negative. H&H relatively stable.  Patient provided consent for blood  transfusion.  Subjective: Ongoing shortness of breath.  Improving.  No nausea no vomiting no fever no chills.  No chest pain.  Physical Exam: Basilar crackles. S1-S2 present present bowel sound present Trace edema lower extremity.  Data Reviewed: I have Reviewed nursing notes, Vitals, and Lab results. Since last encounter, pertinent lab results CBC and BMP   . I have ordered test including CBC and BMP  .   Disposition: Status is: Inpatient Remains inpatient appropriate because: Monitor for improvement in volume status and blood pressure  Place TED hose Start: 04/19/24 0741   Family Communication: Family at his bedside Level of care: Progressive   Vitals:   04/20/24 0350 04/20/24 0749 04/20/24 1152 04/20/24 1604  BP: 130/67 134/72 (!) 103/46 134/72  Pulse: 69 84 84 69  Resp: 20 18 18 18   Temp: 98.3 F (36.8 C) 98 F (36.7 C) 97.9 F (36.6 C) 98.1 F (36.7 C)  TempSrc: Oral Oral Oral Oral  SpO2: 95% 100% 96% 100%  Weight:      Height:         Author: Yetta Blanch, MD 04/20/2024 5:20 PM  Please look on [email or web address] to find out who is on call.

## 2024-04-20 NOTE — Consult Note (Signed)
 Consultation  Referring Provider: TRHSigdel Primary Care Physician:  [name], MD Primary Gastroenterologist:  Dr. Tito  Reason for Consultation: Mild drift in hemoglobin, rectal bleeding  HPI: Luis [name] is a 83 y.o. male who was admitted 2 weeks ago with progressive shortness of breath, and hypoxemic respiratory failure.  Patient has history of sarcoidosis on chronic steroids, COPD/asthma, on Dupixent  and stage IV colon cancer with metastatic disease to the liver and lungs, also with coronary artery disease status post previous drug-eluting stents and remote prostate cancer with radiation seed implants.  He has been managed at [facility] since 2019 and has been on continued chemotherapy. At this point felt to have multifactorial hypoxemic respiratory failure due to right lower lobe pneumonia community-acquired, sarcoidosis, asthma and a component of volume overload.  He continues to require oxygen at 4 L.  Has also been found to have a new pneumomediastinum of unclear etiology, and felt to be at increased risk for evolving pneumothorax.  He also has remote history of prostate cancer and radiation seed implants in 2011. He is on chronic Eliquis after he had a DVT in July 2025 Patient has had a mild anemia and has had  drift in hemoglobin since admission.  His hemoglobin on 04/12/2024 was 12.5, yesterday down to 10.4-today stable at 10.6. He did mention that he had noticed mucousy blood in his stool yesterday and therefore GI was consulted.  On further history patient says that he has had long-term issues with intermittent rectal bleeding which he attributes to internal hemorrhoids.  He says he will see blood on his stool intermittently and when these episodes occur they may last for a couple of days then generally resolve.  He does not have any pain or discomfort from hemorrhoids.  He feels that recently these episodes may last a bit longer but again generally resolves.  He has  no current complaint of abdominal pain or discomfort, surprisingly says his appetite is good is not having any problems with nausea or vomiting.  We had suggested CT of the abdomen and pelvis yesterday which was done as CT angio This did not show any active extravasation to suggest active bleeding, he has moderate stenosis of the SMA and a decreased volume of the previously noted pneumomediastinum, there is progressive airspace consolidation involving the right lower lobe and progressive ground glass attenuation and interstitial thickening throughout the right middle lobe lingula and left lower lobe.  He is status post partial hepatectomy, there is a liver lesion in the periphery of the right lobe measuring 3.8 x 2.3 cm, gallbladder absent, prior right hemicolectomy with enterocolonic anastomosis there are some mild small bowel wall thickening within the right hemiabdomen nonspecific possible enteritis and seed implants within the prostate  Last colonoscopy-Dr. Avram small hemorrhoids present, multiple diverticuli in the sigmoid colon, prior end-to-side ileocolonic anastomosis healthy, no polyps  Past Medical History:  Diagnosis Date   Adenocarcinoma of cecum (HCC) 06/21/2017   Arthritis    mid back; hands; knees (02/24/2015)   Asthmatic bronchitis with acute exacerbation 06/27/2021   Basal cell carcinoma    left shoulder; mid chest; right eyelid (02/24/2015)   CAD (coronary artery disease)    a. 08/2014 Cath/PCI: LM nl, LAD 30p, D1 95 (2.25x12 Resolute Integrity DES), LCX small, RI 100 (attempted PCI) - branches fill via L->L collats, RCA dominant, nl, RPDA/PLA nl, EF 60^. b. 02/24/2015 PCI CTO of Ramus DES x2.   Chronic bronchitis (HCC)    hx  Diverticulosis 05/2005   Elevated lipase    Fuchs' corneal dystrophy    History of adenomatous polyp of colon 05/2005   8 mm adenoma   History of blood transfusion    related to some of my surgeries   Hyperlipidemia    Hypertension    Iron  deficiency anemia due to chronic blood loss 06/21/2017   Pneumonia    Prostate cancer (HCC) 2011   S/P seed implant   Sarcoidosis    Thrombocytopenia    a. Noted on prior labs, unclear of what w/u done.   Trifascicular block     Past Surgical History:  Procedure Laterality Date   BASAL CELL CARCINOMA EXCISION Left    shoulder   CARDIAC CATHETERIZATION N/A 02/24/2015   Procedure: Coronary/Bypass Graft CTO Intervention;  Surgeon: Candyce GORMAN Reek, MD;  Location: [facility] INVASIVE CV LAB;  Service: Cardiovascular;  Laterality: N/A;   CARDIAC CATHETERIZATION  02/24/2015   Procedure: Coronary/Graft Atherectomy;  Surgeon: Candyce GORMAN Reek, MD;  Location: [facility] INVASIVE CV LAB;  Service: Cardiovascular;;   CARDIOVERSION N/A 02/07/2024   Procedure: CARDIOVERSION;  Surgeon: Raford Riggs, MD;  Location: [facility] INVASIVE CV LAB;  Service: Cardiovascular;  Laterality: N/A;   CATARACT EXTRACTION W/ INTRAOCULAR LENS  IMPLANT, BILATERAL Bilateral ~ 2011-2012   COLONOSCOPY W/ POLYPECTOMY  06/08/2005 and 10/18/2010   8 mm adenoma 2006, none 2012. Diverticulosis and internal hemorrhoids.   CORNEAL TRANSPLANT Bilateral ~ 2011-2012   @ same time as cataract OR   CORONARY ANGIOPLASTY WITH STENT PLACEMENT  08/2014; 02/24/2015   1 stent + 1 stent   CORONARY STENT INTERVENTION N/A 01/02/2019   Procedure: CORONARY STENT INTERVENTION;  Surgeon: Reek Candyce GORMAN, MD;  Location: [facility] INVASIVE CV LAB;  Service: Cardiovascular;  Laterality: N/A;   EYE SURGERY     FINGER SURGERY Right 2014   reattached middle finger   INSERTION PROSTATE RADIATION SEED  ~ 2011   IR IMAGING GUIDED PORT INSERTION  03/04/2019   IR RADIOLOGIST EVAL & MGMT  05/26/2020   IR US  GUIDE BX ASP/DRAIN  03/04/2019   LAPAROSCOPIC CHOLECYSTECTOMY  2015   LAPAROSCOPIC PARTIAL COLECTOMY N/A 07/26/2017   Procedure: LAPAROSCOPIC PARTIAL COLECTOMY, EXCISION SCROTAL CYST;  Surgeon: Tanda Locus, MD;  Location: [facility] ORS;  Service: General;  Laterality:  N/A;   LEFT HEART CATH AND CORONARY ANGIOGRAPHY N/A 01/02/2019   Procedure: LEFT HEART CATH AND CORONARY ANGIOGRAPHY;  Surgeon: Reek Candyce GORMAN, MD;  Location: [facility] INVASIVE CV LAB;  Service: Cardiovascular;  Laterality: N/A;   LEFT HEART CATHETERIZATION WITH CORONARY ANGIOGRAM N/A 08/12/2014   Procedure: LEFT HEART CATHETERIZATION WITH CORONARY ANGIOGRAM;  Surgeon: Ozell JONETTA Fell, MD;  Location: [facility] CATH LAB;  Service: Cardiovascular;  Laterality: N/A;   LYMPH NODE BIOPSY     mid chest   MOHS SURGERY Right ~ 2011   eyelid; for basal cell   RADIOLOGY WITH ANESTHESIA N/A 06/30/2020   Procedure: IR WITH ANESTHESIA MICROWAVE ABLATION;  Surgeon: Alona Corners, DO;  Location: [facility] ORS;  Service: Anesthesiology;  Laterality: N/A;   RIGHT/LEFT HEART CATH AND CORONARY ANGIOGRAPHY N/A 04/25/2022   Procedure: RIGHT/LEFT HEART CATH AND CORONARY ANGIOGRAPHY;  Surgeon: Reek Candyce GORMAN, MD;  Location: [facility] INVASIVE CV LAB;  Service: Cardiovascular;  Laterality: N/A;   THORACENTESIS N/A 02/11/2021   Procedure: MILANA;  Surgeon: Annella Donnice SAUNDERS, MD;  Location: [facility] ENDOSCOPY;  Service: Pulmonary;  Laterality: N/A;   THORACENTESIS Right 04/04/2021   Procedure: THORACENTESIS;  Surgeon: Claudene Toribio BROCKS, MD;  Location:  [facility] ENDOSCOPY;  Service: Pulmonary;  Laterality: Right;   THORACENTESIS Right 08/16/2021   Procedure: THORACENTESIS;  Surgeon: Harold Scholz, MD;  Location: [facility] ENDOSCOPY;  Service: Pulmonary;  Laterality: Right;   THORACENTESIS Right 11/29/2021   Procedure: THORACENTESIS;  Surgeon: Kara Dorn NOVAK, MD;  Location: [facility] ENDOSCOPY;  Service: Pulmonary;  Laterality: Right;   THORACENTESIS N/A 03/17/2022   Procedure: MILANA;  Surgeon: Annella Donnice SAUNDERS, MD;  Location: [facility] ENDOSCOPY;  Service: Pulmonary;  Laterality: N/A;   THORACENTESIS N/A 07/14/2022   Procedure: MILANA;  Surgeon: Brenna Adine CROME, DO;  Location: [facility] ENDOSCOPY;  Service: Pulmonary;  Laterality: N/A;    THORACENTESIS N/A 10/11/2022   Procedure: MILANA;  Surgeon: Harold Scholz, MD;  Location: [facility] ENDOSCOPY;  Service: Pulmonary;  Laterality: N/A;   THOROCOTOMY WITH LOBECTOMY Right ~ 1991   partial removal right lung   TONSILLECTOMY  ~ 1950   VIDEO BRONCHOSCOPY N/A 04/11/2024   Procedure: VIDEO BRONCHOSCOPY WITHOUT FLUORO;  Surgeon: Gretta Leita SQUIBB, DO;  Location: [facility] ENDOSCOPY;  Service: Endoscopy;  Laterality: N/A;    Prior to Admission medications   Medication Sig Start Date End Date Taking? Authorizing Provider  albuterol  (PROVENTIL ) (2.5 MG/3ML) 0.083% nebulizer solution INHALE 3 ML BY NEBULIZATION EVERY 6 HOURS AS NEEDED FOR WHEEZING OR SHORTNESS OF BREATH 11/12/23  Yes Cobb, Comer GAILS, NP  albuterol  (VENTOLIN  HFA) 108 (90 Base) MCG/ACT inhaler INHALE 1-2 PUFFS BY MOUTH EVERY 6 HOURS AS NEEDED FOR WHEEZE OR SHORTNESS OF BREATH 07/03/22  Yes Hunsucker, Donnice SAUNDERS, MD  amoxicillin -clavulanate (AUGMENTIN ) 875-125 MG tablet Take 1 tablet by mouth 2 (two) times daily. 04/03/24  Yes Hope Almarie ORN, NP  apixaban (ELIQUIS) 5 MG TABS tablet Take 5 mg by mouth 2 (two) times daily. 01/17/24  Yes [provider]  budesonide -glycopyrrolate-formoterol (BREZTRI  AEROSPHERE) 160-9-4.8 MCG/ACT AERO inhaler Inhale 2 puffs into the lungs in the morning and at bedtime. 04/03/24  Yes Hope Almarie ORN, NP  cetirizine  (ZYRTEC ) 10 MG tablet Take 1 tablet (10 mg total) by mouth daily. Patient taking differently: Take 10 mg by mouth daily as needed for allergies. 09/13/23  Yes Meade Verdon RAMAN, MD  Cholecalciferol  (VITAMIN D3) 2000 units capsule Take 2,000 Units by mouth daily.   Yes [provider]  dronedarone  (MULTAQ ) 400 MG tablet Take 1 tablet (400 mg total) by mouth 2 (two) times daily with a meal. 02/12/24  Yes Ladona Heinz, MD  Dupilumab  (DUPIXENT ) 300 MG/2ML SOAJ Inject 300 mg into the skin every 14 (fourteen) days. 03/31/24  Yes Hunsucker, Donnice SAUNDERS, MD  ezetimibe  (ZETIA ) 10 MG tablet TAKE 1  TABLET BY MOUTH EVERY DAY 01/01/24  Yes Swinyer, Rosaline HERO, NP  fluticasone  (FLONASE ) 50 MCG/ACT nasal spray Place 2 sprays into both nostrils daily. 09/25/23  Yes Hunsucker, Donnice SAUNDERS, MD  furosemide  (LASIX ) 20 MG tablet Take 1 tablet (20 mg total) by mouth daily as needed for fluid or edema. Patient taking differently: Take 20 mg by mouth daily. 01/08/24  Yes Ladona Heinz, MD  gabapentin  (NEURONTIN ) 100 MG capsule Take 200 mg by mouth at bedtime.   Yes [provider]  guaiFENesin -dextromethorphan  (ROBITUSSIN DM) 100-10 MG/5ML syrup Take 10 mLs by mouth every 6 (six) hours as needed for cough.   Yes [provider]  ibuprofen (ADVIL) 200 MG tablet Take 400 mg by mouth every 6 (six) hours as needed for moderate pain (pain score 4-6) or mild pain (pain score 1-3).   Yes [provider]  levothyroxine  (SYNTHROID ) 50 MCG  tablet Take 50 mcg by mouth daily before breakfast. 02/20/23  Yes [provider]  magnesium  oxide (MAG-OX) 400 MG tablet Take 1 tablet by mouth 2 (two) times daily.   Yes [provider]  metroNIDAZOLE (METROCREAM) 0.75 % cream Apply 1 Application topically 2 (two) times daily as needed (rash). 09/21/23  Yes [provider]  minocycline (MINOCIN) 100 MG capsule Take 100 mg by mouth daily. 12/09/23  Yes [provider]  montelukast  (SINGULAIR ) 10 MG tablet Take 1 tablet (10 mg total) by mouth at bedtime. 09/13/23  Yes Desai, Nikita S, MD  Multiple Vitamins-Minerals (VITRUM SENIOR) TABS Take 1 tablet by mouth daily.   Yes [provider]  Naphazoline-Pheniramine ([employer] EYE ALLERGY RELIEF) 0.027-0.315 % SOLN Place 1 drop into both eyes 2 (two) times daily.   Yes [provider]  naproxen  sodium (ALEVE ) 220 MG tablet Take 440 mg by mouth 2 (two) times daily as needed (pain).   Yes [provider]  potassium chloride  (KLOR-CON ) 10 MEQ tablet Take 10 mEq by mouth daily. 02/17/24 02/16/25 Yes [provider]   predniSONE  (DELTASONE ) 5 MG tablet Take 3 tablets (15mg ) daily 04/03/24  Yes Hope Almarie ORN, NP  rosuvastatin  (CRESTOR ) 20 MG tablet TAKE 1 TABLET BY MOUTH EVERY DAY 02/07/24  Yes Ladona Heinz, MD  traMADol  (ULTRAM ) 50 MG tablet Take 2 tablets (100 mg total) by mouth every 6 (six) hours as needed. 05/29/21  Yes Ebbie Cough, MD  nitroGLYCERIN  (NITROSTAT ) 0.4 MG SL tablet PLACE 1 TABLET UNDER THE TONGUE EVERY 5 MINUTES AS NEEDED FOR CHEST PAIN FOR 3 DOSES Patient not taking: Reported on 04/07/2024 08/15/23   Lelon Hamilton T, PA-C  ranolazine  (RANEXA ) 500 MG 12 hr tablet TAKE 1 TABLET BY MOUTH TWICE A DAY Patient not taking: Reported on 04/07/2024 10/04/23   Lelon Hamilton T, PA-C    Current Facility-Administered Medications  Medication Dose Route Frequency Provider Last Rate Last Admin   acetaminophen  (TYLENOL ) tablet 1,000 mg  1,000 mg Oral Q6H PRN Segars, Jonathan, MD   1,000 mg at 04/20/24 1243   albuterol  (PROVENTIL ) (2.5 MG/3ML) 0.083% nebulizer solution 2.5 mg  2.5 mg Nebulization Q4H PRN Keturah Carrier, MD   2.5 mg at 04/20/24 0957   budesonide -glycopyrrolate-formoterol (BREZTRI ) 160-9-4.8 MCG/ACT inhaler 2 puff  2 puff Inhalation BID Segars, Jonathan, MD   2 puff at 04/20/24 0949   cefTRIAXone  (ROCEPHIN ) 2 g in sodium chloride  0.9 % 100 mL IVPB  2 g Intravenous Q24H Kc, Ramesh, MD 200 mL/hr at 04/19/24 1722 2 g at 04/19/24 1722   Chlorhexidine  Gluconate Cloth 2 % PADS 6 each  6 each Topical Daily Christobal Guadalajara, MD   6 each at 04/19/24 0851   docusate sodium  (COLACE) capsule 100 mg  100 mg Oral BID Kc, [place]adal[place], MD   100 mg at 04/19/24 2145   doxycycline  (VIBRA -TABS) tablet 100 mg  100 mg Oral Q12H Kc, Guadal[place]ara, MD   100 mg at 04/20/24 0935   dronedarone  (MULTAQ ) tablet 400 mg  400 mg Oral BID WC Segars, Jonathan, MD   400 mg at 04/20/24 0934   ezetimibe  (ZETIA ) tablet 10 mg  10 mg Oral Daily Segars, Jonathan, MD   10 mg at 04/20/24 0935   gabapentin  (NEURONTIN ) capsule 200 mg  200 mg Oral  QHS Segars, Jonathan, MD   200 mg at 04/19/24 2144   guaiFENesin  (MUCINEX ) 12 hr tablet 600 mg  600 mg Oral BID Segars, Jonathan, MD   600 mg at  04/20/24 0935   insulin aspart (novoLOG) injection 0-15 Units  0-15 Units Subcutaneous TID WC Reome, Earle J, RPH   8 Units at 04/19/24 1717   levothyroxine  (SYNTHROID ) tablet 50 mcg  50 mcg Oral Q0600 Segars, Jonathan, MD   50 mcg at 04/20/24 0519   melatonin tablet 6 mg  6 mg Oral QHS PRN Keturah Carrier, MD       NOREEN ON 04/21/2024] minocycline (MINOCIN) capsule 100 mg  100 mg Oral Daily Kc, Ramesh, MD       montelukast  (SINGULAIR ) tablet 10 mg  10 mg Oral QHS Segars, Jonathan, MD   10 mg at 04/19/24 2145   ondansetron  (ZOFRAN ) injection 4 mg  4 mg Intravenous Q6H PRN Keturah Carrier, MD   4 mg at 04/11/24 1304   Oral care mouth rinse  15 mL Mouth Rinse PRN Kc, Mennie, MD       pantoprazole (PROTONIX) injection 40 mg  40 mg Intravenous Q12H Sigdel, Derryl, MD   40 mg at 04/20/24 0937   phenol (CHLORASEPTIC) mouth spray 1 spray  1 spray Mouth/Throat PRN Daniels, James K, NP   1 spray at 04/08/24 2147   polyethylene glycol (MIRALAX / GLYCOLAX) packet 17 g  17 g Oral Daily PRN Segars, Jonathan, MD       predniSONE  (DELTASONE ) tablet 30 mg  30 mg Oral Q breakfast Sigdel, Santosh, MD   30 mg at 04/20/24 0935   Followed by   NOREEN ON 04/22/2024] predniSONE  (DELTASONE ) tablet 20 mg  20 mg Oral Q breakfast Sigdel, Santosh, MD       Followed by   NOREEN ON 04/29/2024] predniSONE  (DELTASONE ) tablet 15 mg  15 mg Oral Q breakfast Sigdel, Santosh, MD       rosuvastatin  (CRESTOR ) tablet 20 mg  20 mg Oral Daily Segars, Carrier, MD   20 mg at 04/20/24 0935   sodium chloride  (OCEAN) 0.65 % nasal spray 1 spray  1 spray Each Nare PRN Christobal Mennie, MD   1 spray at 04/11/24 1105   sodium chloride  flush (NS) 0.9 % injection 10-40 mL  10-40 mL Intracatheter Q12H Kc, Ramesh, MD   10 mL at 04/20/24 9061   sodium chloride  flush (NS) 0.9 % injection 10-40 mL  10-40 mL  Intracatheter PRN Christobal Mennie, MD        Allergies as of 04/07/2024 - Review Complete 04/07/2024  Allergen Reaction Noted   Losartan  potassium Other (See Comments) 02/12/2024    Family History  Problem Relation Age of Onset   Heart disease Father 4       Died from hardening of the arteries age 74   Coronary artery disease Sister 99   Heart attack Sister    Colon cancer Neg Hx    Throat cancer Neg Hx    Pancreatic cancer Neg Hx    Prostate cancer Neg Hx    Stroke Neg Hx    Hypertension Neg Hx     Social History   Socioeconomic History   Marital status: Married    Spouse name: Not on file   Number of children: 2   Years of education: Not on file   Highest education level: Not on file  Occupational History   Occupation: Unemployed  Tobacco Use   Smoking status: Former    Types: Cigars   Smokeless tobacco: Never   Tobacco comments:    Has stopped smoking and is no longer smoking cigars as of 2024.   Vaping Use  Vaping status: Never Used  Substance and Sexual Activity   Alcohol use: Yes    Alcohol/week: 2.0 standard drinks of alcohol    Types: 1 Glasses of wine, 1 Standard drinks or equivalent per week    Comment: Drinks maybe 1-2 drinks a week or less   Drug use: No   Sexual activity: Not on file  Other Topics Concern   Not on file  Social History Narrative   Lives at home wife.     Social Drivers of Corporate Investment Banker Strain: Not on file  Food Insecurity: No Food Insecurity (04/07/2024)   Hunger Vital Sign    Worried About Running Out of Food in the Last Year: Never true    Ran Out of Food in the Last Year: Never true  Transportation Needs: No Transportation Needs (04/07/2024)   PRAPARE - Administrator, Civil Service (Medical): No    Lack of Transportation (Non-Medical): No  Physical Activity: Not on file  Stress: Not on file  Social Connections: Socially Integrated (04/07/2024)   Social Connection and Isolation Panel     Frequency of Communication with Friends and Family: More than three times a week    Frequency of Social Gatherings with Friends and Family: Three times a week    Attends Religious Services: More than 4 times per year    Active Member of Clubs or Organizations: Yes    Attends Banker Meetings: More than 4 times per year    Marital Status: Married  Catering Manager Violence: Not At Risk (04/07/2024)   Humiliation, Afraid, Rape, and Kick questionnaire    Fear of Current or Ex-Partner: No    Emotionally Abused: No    Physically Abused: No    Sexually Abused: No    Review of Systems: Pertinent positive and negative review of systems were noted in the above HPI section.  All other review of systems was otherwise negative.  Physical Exam: Vital signs in last 24 hours: Temp:  [97.9 F (36.6 C)-98.3 F (36.8 C)] 97.9 F (36.6 C) (11/09 1152) Pulse Rate:  [68-84] 84 (11/09 1152) Resp:  [18-20] 18 (11/09 1152) BP: (103-134)/(46-72) 103/46 (11/09 1152) SpO2:  [92 %-100 %] 96 % (11/09 1152) Weight:  [93.4 kg] 93.4 kg (11/09 0228) Last BM Date : 04/19/24 General:   Alert,  Well-developed, chronically ill-appearing elderly white male pleasant and cooperative in NAD, on O2 at 4 L, some mild dyspnea with prolonged talking Head:  Normocephalic and atraumatic. Eyes:  Sclera clear, no icterus.   Conjunctiva pink. Ears:  Normal auditory acuity. Nose:  No deformity, discharge,  or lesions. Mouth:  No deformity or lesions.   Neck:  Supple; no masses or thyromegaly. Lungs: Mild increased work of breathing,  heart:  Regular rate and rhythm; no murmurs, clicks, rubs,  or gallops. Abdomen:  Soft,nontender, BS active, no definite mass or hepatosplenomegaly Rectal: Not done Msk:  Symmetrical without gross deformities. . Pulses:  Normal pulses noted. Extremities:  Without clubbing or edema. Neurologic:  Alert and  oriented x4;  grossly normal neurologically. Skin:  Intact without  significant lesions or rashes.. Psych:  Alert and cooperative. Normal mood and affect.  Intake/Output from previous day: 11/08 0701 - 11/09 0700 In: 1120 [P.O.:120; IV Piggyback:1000] Out: 1025 [Urine:1025] Intake/Output this shift: No intake/output data recorded.  Lab Results: Recent Labs    04/18/24 0312 04/18/24 2057 04/19/24 0325 04/20/24 0320  WBC 13.0*  --  11.8* 11.9*  HGB  11.9* 11.1* 10.4* 10.6*  HCT 37.0* 34.9* 32.5* 33.0*  PLT 143*  --  112* 107*   BMET Recent Labs    04/18/24 0312 04/19/24 0325 04/20/24 0320  NA 134* 136 135  K 3.6 3.0* 4.3  CL 92* 93* 96*  CO2 30 32 28  GLUCOSE 70 78 76  BUN 26* 27* 23  CREATININE 1.35* 1.18 1.05  CALCIUM  7.7* 7.6* 8.0*   LFT No results for input(s): PROT, ALBUMIN, AST, ALT, ALKPHOS, BILITOT, BILIDIR, IBILI in the last 72 hours. PT/INR No results for input(s): LABPROT, INR in the last 72 hours. Hepatitis Panel No results for input(s): HEPBSAG, HCVAB, HEPAIGM, HEPBIGM in the last 72 hours.    IMPRESSION:  #65 83 year old white male with history of stage IV cecal adenocarcinoma with metastatic disease to the lungs and liver, initial diagnosis 2019, he has been aggressively managed, is status post right hemicolectomy and partial hepatectomy and has been continued on maintenance chemotherapy  #2 small-volume rectal bleeding in setting of Eliquis-hemoglobin has drifted some since admission likely multifactoral, stable today  CT abdomen pelvis yesterday with no evidence of active bleeding, and no concerning findings in the colon.  He may have a mild enteritis which is nonspecific and asymptomatic.  Gives a good history for longstanding issues with rectal bleeding secondary to internal hemorrhoids.  This may have increased over the past several months because of the addition of Eliquis for the DVT.  In addition he did have remote radiation seed implants and did not have any evidence of radiation  proctitis on colonoscopy in 2020-but is still consideration  #3 multifactoral hypoxemic respiratory failure with persistent/somewhat progressive right pneumonia, underlying sarcoidosis, COPD and asthma as well as component of volume overload Continues to require oxygen.   PLAN: Okay to resume Eliquis Does not have any evidence of any active GI bleeding, this has been a chronic issue and very likely secondary to internal hemorrhoids. We can arrange for him to have outpatient follow-up in a couple of months may need to consider flexible sigmoidoscopy first, and then hemorrhoidal banding if no other rectal issues identified.  Patient did express interest in hemorrhoidal banding if this would help. Will ask our office to reach out to him in several weeks to offer office follow-up   Luis Lefebre PA-C 04/20/2024, 12:48 PM

## 2024-04-20 NOTE — Evaluation (Signed)
 Clinical/Bedside Swallow Evaluation Patient Details  Name: Luis [name] MRN: [id number] Date of Birth: 02-Apr-1941  Today's Date: 04/20/2024 Time: SLP Start Time (ACUTE ONLY): 1535 SLP Stop Time (ACUTE ONLY): 1548 SLP Time Calculation (min) (ACUTE ONLY): 13 min  Past Medical History:  Past Medical History:  Diagnosis Date   Adenocarcinoma of cecum (HCC) 06/21/2017   Arthritis    mid back; hands; knees (02/24/2015)   Asthmatic bronchitis with acute exacerbation 06/27/2021   Basal cell carcinoma    left shoulder; mid chest; right eyelid (02/24/2015)   CAD (coronary artery disease)    a. 08/2014 Cath/PCI: LM nl, LAD 30p, D1 95 (2.25x12 Resolute Integrity DES), LCX small, RI 100 (attempted PCI) - branches fill via L->L collats, RCA dominant, nl, RPDA/PLA nl, EF 60^. b. 02/24/2015 PCI CTO of Ramus DES x2.   Chronic bronchitis (HCC)    hx   Diverticulosis 05/2005   Elevated lipase    Fuchs' corneal dystrophy    History of adenomatous polyp of colon 05/2005   8 mm adenoma   History of blood transfusion    related to some of my surgeries   Hyperlipidemia    Hypertension    Iron deficiency anemia due to chronic blood loss 06/21/2017   Pneumonia    Prostate cancer (HCC) 2011   S/P seed implant   Sarcoidosis    Thrombocytopenia    a. Noted on prior labs, unclear of what w/u done.   Trifascicular block    Past Surgical History:  Past Surgical History:  Procedure Laterality Date   BASAL CELL CARCINOMA EXCISION Left    shoulder   CARDIAC CATHETERIZATION N/A 02/24/2015   Procedure: Coronary/Bypass Graft CTO Intervention;  Surgeon: Candyce GORMAN Reek, MD;  Location: [facility] INVASIVE CV LAB;  Service: Cardiovascular;  Laterality: N/A;   CARDIAC CATHETERIZATION  02/24/2015   Procedure: Coronary/Graft Atherectomy;  Surgeon: Candyce GORMAN Reek, MD;  Location: [facility] INVASIVE CV LAB;  Service: Cardiovascular;;   CARDIOVERSION N/A 02/07/2024   Procedure: CARDIOVERSION;  Surgeon: Raford Riggs,  MD;  Location: [facility] INVASIVE CV LAB;  Service: Cardiovascular;  Laterality: N/A;   CATARACT EXTRACTION W/ INTRAOCULAR LENS  IMPLANT, BILATERAL Bilateral ~ 2011-2012   COLONOSCOPY W/ POLYPECTOMY  06/08/2005 and 10/18/2010   8 mm adenoma 2006, none 2012. Diverticulosis and internal hemorrhoids.   CORNEAL TRANSPLANT Bilateral ~ 2011-2012   @ same time as cataract OR   CORONARY ANGIOPLASTY WITH STENT PLACEMENT  08/2014; 02/24/2015   1 stent + 1 stent   CORONARY STENT INTERVENTION N/A 01/02/2019   Procedure: CORONARY STENT INTERVENTION;  Surgeon: Reek Candyce GORMAN, MD;  Location: [facility] INVASIVE CV LAB;  Service: Cardiovascular;  Laterality: N/A;   EYE SURGERY     FINGER SURGERY Right 2014   reattached middle finger   INSERTION PROSTATE RADIATION SEED  ~ 2011   IR IMAGING GUIDED PORT INSERTION  03/04/2019   IR RADIOLOGIST EVAL & MGMT  05/26/2020   IR US  GUIDE BX ASP/DRAIN  03/04/2019   LAPAROSCOPIC CHOLECYSTECTOMY  2015   LAPAROSCOPIC PARTIAL COLECTOMY N/A 07/26/2017   Procedure: LAPAROSCOPIC PARTIAL COLECTOMY, EXCISION SCROTAL CYST;  Surgeon: Tanda Locus, MD;  Location: [facility] ORS;  Service: General;  Laterality: N/A;   LEFT HEART CATH AND CORONARY ANGIOGRAPHY N/A 01/02/2019   Procedure: LEFT HEART CATH AND CORONARY ANGIOGRAPHY;  Surgeon: Reek Candyce GORMAN, MD;  Location: [facility] INVASIVE CV LAB;  Service: Cardiovascular;  Laterality: N/A;   LEFT HEART CATHETERIZATION WITH CORONARY ANGIOGRAM N/A 08/12/2014   Procedure: LEFT  HEART CATHETERIZATION WITH CORONARY ANGIOGRAM;  Surgeon: Ozell JONETTA Fell, MD;  Location: [facility] CATH LAB;  Service: Cardiovascular;  Laterality: N/A;   LYMPH NODE BIOPSY     mid chest   MOHS SURGERY Right ~ 2011   eyelid; for basal cell   RADIOLOGY WITH ANESTHESIA N/A 06/30/2020   Procedure: IR WITH ANESTHESIA MICROWAVE ABLATION;  Surgeon: Alona Corners, DO;  Location: [facility] ORS;  Service: Anesthesiology;  Laterality: N/A;   RIGHT/LEFT HEART CATH AND CORONARY ANGIOGRAPHY N/A 04/25/2022    Procedure: RIGHT/LEFT HEART CATH AND CORONARY ANGIOGRAPHY;  Surgeon: Dann Candyce RAMAN, MD;  Location: [facility] INVASIVE CV LAB;  Service: Cardiovascular;  Laterality: N/A;   THORACENTESIS N/A 02/11/2021   Procedure: MILANA;  Surgeon: Annella Donnice SAUNDERS, MD;  Location: [facility] ENDOSCOPY;  Service: Pulmonary;  Laterality: N/A;   THORACENTESIS Right 04/04/2021   Procedure: THORACENTESIS;  Surgeon: Claudene Toribio BROCKS, MD;  Location: [facility] ENDOSCOPY;  Service: Pulmonary;  Laterality: Right;   THORACENTESIS Right 08/16/2021   Procedure: THORACENTESIS;  Surgeon: Harold Scholz, MD;  Location: [facility] ENDOSCOPY;  Service: Pulmonary;  Laterality: Right;   THORACENTESIS Right 11/29/2021   Procedure: THORACENTESIS;  Surgeon: Kara Dorn NOVAK, MD;  Location: [facility] ENDOSCOPY;  Service: Pulmonary;  Laterality: Right;   THORACENTESIS N/A 03/17/2022   Procedure: MILANA;  Surgeon: Annella Donnice SAUNDERS, MD;  Location: [facility] ENDOSCOPY;  Service: Pulmonary;  Laterality: N/A;   THORACENTESIS N/A 07/14/2022   Procedure: MILANA;  Surgeon: Brenna Adine CROME, DO;  Location: [facility] ENDOSCOPY;  Service: Pulmonary;  Laterality: N/A;   THORACENTESIS N/A 10/11/2022   Procedure: MILANA;  Surgeon: Harold Scholz, MD;  Location: [facility] ENDOSCOPY;  Service: Pulmonary;  Laterality: N/A;   THOROCOTOMY WITH LOBECTOMY Right ~ 1991   partial removal right lung   TONSILLECTOMY  ~ 1950   VIDEO BRONCHOSCOPY N/A 04/11/2024   Procedure: VIDEO BRONCHOSCOPY WITHOUT FLUORO;  Surgeon: Gretta Leita SQUIBB, DO;  Location: [facility] ENDOSCOPY;  Service: Endoscopy;  Laterality: N/A;   HPI:  Pt is an 83 year old male, immunocompromised status on ongoing chemotherapy, admitted with Multifactorial hypoxemic respiratory failure due to right lower lobe CAP, sarcoidosis with associated asthma, component of volume overload. Community-acquired pneumonia: Patchy infiltrates ground glass on the left, right basilar consolidation superimposed on some old scar consistent with CAP.   BAL with greater than 50% neutrophils.  Concerning for suppurative, bacterial infection per pulmonologist. Esophagram ordered.  Pt with a history of stage IV colon cancer with metastatic disease to the liver and lungs, also with coronary artery disease status post previous drug-eluting stents and remote prostate cancer with radiation seed implants.  He has been managed at [facility] since 2019 and has been on continued chemotherapy.    Assessment / Plan / Recommendation  Clinical Impression  Pt demonstrates no signs of aspiration and no difficulty with mastication. Pt does reports some esophageal symptoms including burning and a feeling his food is moving slowly. He attributes this mostly to position in bed while in the hospital. We discussed importance of oral hygiene to reduce oral bacterial load, compensatory strategies for esophageal dysphagia and how to position the bed more comfortably. Pt to have esophagram tomorrow per orders. No SLP f/u needed unless esophagram reveals occult oropharyngeal dysphagia. Will sign off.  SLP Visit Diagnosis: Dysphagia, unspecified (R13.10)    Aspiration Risk  Mild aspiration risk    Diet Recommendation Regular;Thin liquid    Liquid Administration via: Cup;Straw Medication Administration: Whole meds with liquid Supervision: Patient able to self feed Compensations: Follow  solids with liquid Postural Changes: Seated upright at 90 degrees;Remain upright for at least 30 minutes after po intake    Other  Recommendations Oral Care Recommendations: Oral care BID     Assistance Recommended at Discharge    Functional Status Assessment    Frequency and Duration            Prognosis        Swallow Study   General HPI: Pt is an 83 year old male, immunocompromised status on ongoing chemotherapy, admitted with Multifactorial hypoxemic respiratory failure due to right lower lobe CAP, sarcoidosis with associated asthma, component of volume overload. Community-acquired  pneumonia: Patchy infiltrates ground glass on the left, right basilar consolidation superimposed on some old scar consistent with CAP.  BAL with greater than 50% neutrophils.  Concerning for suppurative, bacterial infection per pulmonologist. Esophagram ordered.  Pt with a history of stage IV colon cancer with metastatic disease to the liver and lungs, also with coronary artery disease status post previous drug-eluting stents and remote prostate cancer with radiation seed implants.  He has been managed at [facility] since 2019 and has been on continued chemotherapy. Type of Study: Bedside Swallow Evaluation Previous Swallow Assessment: none Diet Prior to this Study: Regular;Thin liquids (Level 0) Temperature Spikes Noted: No Respiratory Status: Nasal cannula History of Recent Intubation: No Behavior/Cognition: Alert;Cooperative Self-Feeding Abilities: Able to feed self Patient Positioning: Upright in bed Baseline Vocal Quality: Normal Volitional Cough: Strong Volitional Swallow: Able to elicit    Oral/Motor/Sensory Function Overall Oral Motor/Sensory Function: Within functional limits   Ice Chips     Thin Liquid Thin Liquid: Within functional limits Presentation: Straw;Self Fed    Nectar Thick Nectar Thick Liquid: Not tested   Honey Thick Honey Thick Liquid: Not tested   Puree Puree: Not tested   Solid     Solid: Within functional limits      Delrico Minehart, Consuelo Fitch 04/20/2024,3:53 PM

## 2024-04-20 NOTE — Progress Notes (Incomplete)
 NAME:  Luis [name], MRN:  [id number], DOB:  [birth year], LOS: 13 ADMISSION DATE:  04/07/2024, CONSULTATION DATE:  10/30 REFERRING MD:  KC-TRH, CHIEF COMPLAINT:  acute resp failure   History of Present Illness:  Luis [name] is an 83 y/o gentleman with a history of pulmonary sarcoidosis on chronic steriods, COPD-asthma overlap on triple inhaled therapy & Dupixent , stage IV cecal adenocarcinoma being treated at [facility] since 2019 who presented to the hospital with progressive shortness of breath and dry cough over 4 days on 04/07/2024.  He had been seen in pulmonary clinic today following his symptoms starting on 7/23.  He had an exacerbation of his respiratory disease in March and September of this year.  Since his exacerbation in September he had gone back on prednisone , tapering from 30 mg to 20 mg to 10 mg daily.  When he got back down to 10 mg daily dose he felt his symptoms flared up again, similar to tapers done in the 1990s when he was on chronic steroids for sarcoidosis.  He has only been on intermittent steroids since 1990s; he has not been on steroid sparing DMARD therapy due to quiescent sarcoidosis over the years.  He has not had sick contacts recently.  No new symptoms other than some leg swelling consistent with hospital.  No fevers.  He has had green sputum production in the hospital, but did not have sputum at home.  He is not on home oxygen.  He last had chemotherapy 2 weeks ago-pembrolizumab and 5-fluorouracil .  He felt well after this for a few days before starting to feel run down.  His meds are not to take primary support.  He is on Eliquis for DVT diagnosed in late July 2025 & Afib.  Patient hospital because required 4 to 6 L nasal cannula.  He has been maintained on azithromycin , ceftriaxone , prednisone  40mg  daily. Overall he felt a little better yesterday but is more dyspneic again today.  Pertinent  Medical History  Afib, recent cardioversion Metastatic adenocarcinoma of  cecum Pulmonary sarcoidosis diagnosed by open lung biopsy in 1990s COPD asthma overlap Coronary artery disease HTN HLD  Significant Hospital Events: Including procedures, antibiotic start and stop dates in addition to other pertinent events   10/26 admitted to the hospital, started on ceftriaxone  and azithromycin , prednisone  40 mg daily 10/29 completed azithromycin  10/30 stopped ceftriaxone  11/5 desaturation at 0400 overnight, transition to 4 L/min.  Then up to 10 L/min when he was moved to chair.  Now weaned to 6 L/min 11/6 CT-PA chest >> no PE, calcified hilar and mediastinal nodes, new mediastinal air without any evidence for pneumothorax.  Right lung base infiltrate unchanged, some ground glass on the left unchanged 11/9 ***  Interim History / Subjective:  He is overall feeling better but does have some left-sided chest discomfort that is new Oxygen has been weaned to 3 L/min and SpO2 1.00.  Question whether we were getting accurate data CT-PA with evidence for pneumomediastinum as above, otherwise unchanged  Objective    Blood pressure 134/72, pulse 84, temperature 98 F (36.7 C), temperature source Oral, resp. rate 18, height 5' 11 (1.803 m), weight 93.4 kg, SpO2 100%.        Intake/Output Summary (Last 24 hours) at 04/20/2024 0957 Last data filed at 04/20/2024 0350 Gross per 24 hour  Intake 1120 ml  Output 1025 ml  Net 95 ml   Filed Weights   04/16/24 0410 04/19/24 0442 04/20/24 0228  Weight: 97.1 kg 91 kg  93.4 kg    Examination: General: Chronically ill man, comfortable in bed on O2 HENT: Somewhat hoarse voice, no secretions, oropharynx clear Lungs: Comfortable, scattered crackles and wheezes Cardiovascular: 1+ bilateral lower extremity pitting edema (improved).  Heart distant without a murmur Neuro: Awake and alert, appropriate, nonfocal   Resolved problem list   Assessment and Plan   Community-acquired pneumonia: Patchy infiltrates ground glass on the  left, right basilar consolidation superimposed on some old scar consistent with CAP.  BAL with greater than 50% neutrophils.  Concerning for suppurative, bacterial infection.  Cultures are negative, likely due to preceding antibiotics -Complete 14-day course ceftriaxone  - I-S, pulmonary hygiene  Pneumomediastinum, new - Follow serial chest x-rays.  Likely no intervention needs to be made as long as stable.  He is at risk for evolving pneumothorax so discussed with him the importance of notifying caregivers for any change in breathing, chest pain, pleuritic pain, etc.  Multifactorial hypoxemic respiratory failure due to right lower lobe CAP, sarcoidosis with associated asthma, component of volume overload -Weaning oxygen effectively, question whether we were getting accurate SpO2 - Complete antibiotics as above - Continue Breztri  - Agree with prolonged prednisone  taper.  Hopefully he will wean off completely as an outpatient although he has required intermittent prednisone  dosing even at baseline - Suspect he will need oxygen at time of discharge  HFpEF, lower extremity swelling: Likely due to fluid administration this hospitalization.  Possibly prolonging hypoxemia. -Has tolerated diuresis, serum creatinine beginning to bump.  Will decrease his Lasix  to 40 mg IV daily today, can likely transition back to his home regimen soon -Follow BMP  Asthma:  -Home regimen of Dupixent , Singulair , Breztri , albuterol .  Would be reasonable for him to have his Dupixent  (he is due).  He could bring in his home med for administration here -Plan for prednisone  taper off as an outpatient: Prednisone  taper to start on discharge: Prednisone  30 mg daily for 7 days, Prednisone  20 mg daily for 7 days, Prednisone  15 mg daily for 7 days, Prednisone  10 mg daily for 7 days, Prednisone  5 mg daily for 7 days, then stop   Labs   CBC: Recent Labs  Lab 04/14/24 1039 04/16/24 1013 04/18/24 0312 04/18/24 2057  04/19/24 0325 04/20/24 0320  WBC 15.5* 17.9* 13.0*  --  11.8* 11.9*  NEUTROABS  --   --   --   --  10.6* 10.8*  HGB 12.7* 12.4* 11.9* 11.1* 10.4* 10.6*  HCT 39.9 38.6* 37.0* 34.9* 32.5* 33.0*  MCV 95.7 93.7 93.9  --  93.7 94.0  PLT 167 149* 143*  --  112* 107*    Basic Metabolic Panel: Recent Labs  Lab 04/16/24 0504 04/17/24 0430 04/18/24 0312 04/19/24 0325 04/20/24 0320  NA 138 139 134* 136 135  K 3.8 3.1* 3.6 3.0* 4.3  CL 94* 93* 92* 93* 96*  CO2 29 32 30 32 28  GLUCOSE 110* 86 70 78 76  BUN 28* 27* 26* 27* 23  CREATININE 1.10 1.23 1.35* 1.18 1.05  CALCIUM  8.2* 7.9* 7.7* 7.6* 8.0*      Critical care time: n/a    Lamar Chris, MD, PhD 04/20/2024, 9:57 AM [facility] [phone] or if no answer before 7:00PM call [phone] For any issues after 7:00PM please call eLink [phone]

## 2024-04-21 ENCOUNTER — Inpatient Hospital Stay (HOSPITAL_COMMUNITY)

## 2024-04-21 DIAGNOSIS — E8770 Fluid overload, unspecified: Secondary | ICD-10-CM | POA: Diagnosis not present

## 2024-04-21 DIAGNOSIS — J9601 Acute respiratory failure with hypoxia: Secondary | ICD-10-CM | POA: Diagnosis not present

## 2024-04-21 DIAGNOSIS — J189 Pneumonia, unspecified organism: Secondary | ICD-10-CM | POA: Diagnosis not present

## 2024-04-21 DIAGNOSIS — J9691 Respiratory failure, unspecified with hypoxia: Secondary | ICD-10-CM | POA: Diagnosis not present

## 2024-04-21 LAB — CBC WITH DIFFERENTIAL/PLATELET
Abs Immature Granulocytes: 0.09 K/uL — ABNORMAL HIGH (ref 0.00–0.07)
Basophils Absolute: 0 K/uL (ref 0.0–0.1)
Basophils Relative: 0 %
Eosinophils Absolute: 0 K/uL (ref 0.0–0.5)
Eosinophils Relative: 0 %
HCT: 33.9 % — ABNORMAL LOW (ref 39.0–52.0)
Hemoglobin: 10.6 g/dL — ABNORMAL LOW (ref 13.0–17.0)
Immature Granulocytes: 1 %
Lymphocytes Relative: 1 %
Lymphs Abs: 0.2 K/uL — ABNORMAL LOW (ref 0.7–4.0)
MCH: 29.8 pg (ref 26.0–34.0)
MCHC: 31.3 g/dL (ref 30.0–36.0)
MCV: 95.2 fL (ref 80.0–100.0)
Monocytes Absolute: 0.7 K/uL (ref 0.1–1.0)
Monocytes Relative: 6 %
Neutro Abs: 10.4 K/uL — ABNORMAL HIGH (ref 1.7–7.7)
Neutrophils Relative %: 92 %
Platelets: 101 K/uL — ABNORMAL LOW (ref 150–400)
RBC: 3.56 MIL/uL — ABNORMAL LOW (ref 4.22–5.81)
RDW: 17 % — ABNORMAL HIGH (ref 11.5–15.5)
WBC: 11.3 K/uL — ABNORMAL HIGH (ref 4.0–10.5)
nRBC: 0 % (ref 0.0–0.2)

## 2024-04-21 LAB — COMPREHENSIVE METABOLIC PANEL WITH GFR
ALT: 119 U/L — ABNORMAL HIGH (ref 0–44)
AST: 130 U/L — ABNORMAL HIGH (ref 15–41)
Albumin: 1.9 g/dL — ABNORMAL LOW (ref 3.5–5.0)
Alkaline Phosphatase: 592 U/L — ABNORMAL HIGH (ref 38–126)
Anion gap: 5 (ref 5–15)
BUN: 20 mg/dL (ref 8–23)
CO2: 29 mmol/L (ref 22–32)
Calcium: 7.9 mg/dL — ABNORMAL LOW (ref 8.9–10.3)
Chloride: 99 mmol/L (ref 98–111)
Creatinine, Ser: 0.96 mg/dL (ref 0.61–1.24)
GFR, Estimated: >60 mL/min (ref >60)
Glucose, Bld: 120 mg/dL — ABNORMAL HIGH (ref 70–99)
Potassium: 4.1 mmol/L (ref 3.5–5.1)
Sodium: 133 mmol/L — ABNORMAL LOW (ref 135–145)
Total Bilirubin: 0.7 mg/dL (ref 0.0–1.2)
Total Protein: 4.4 g/dL — ABNORMAL LOW (ref 6.5–8.1)

## 2024-04-21 LAB — GLUCOSE, CAPILLARY
Glucose-Capillary: 97 mg/dL (ref 70–99)
Glucose-Capillary: 248 mg/dL — ABNORMAL HIGH (ref 70–99)
Glucose-Capillary: 205 mg/dL — ABNORMAL HIGH (ref 70–99)
Glucose-Capillary: 202 mg/dL — ABNORMAL HIGH (ref 70–99)

## 2024-04-21 LAB — MAGNESIUM: Magnesium: 1.4 mg/dL — ABNORMAL LOW (ref 1.7–2.4)

## 2024-04-21 MED ORDER — ALBUMIN HUMAN 25 % IV SOLN
12.5000 g | Freq: Once | INTRAVENOUS | Status: AC
  Administered 2024-04-21: 12.5 g via INTRAVENOUS
  Filled 2024-04-21: qty 50

## 2024-04-21 MED ORDER — FUROSEMIDE 10 MG/ML IJ SOLN
60.0000 mg | Freq: Once | INTRAMUSCULAR | Status: AC
  Administered 2024-04-21: 60 mg via INTRAVENOUS
  Filled 2024-04-21: qty 6

## 2024-04-21 MED ORDER — FUROSEMIDE 10 MG/ML IJ SOLN
40.0000 mg | Freq: Once | INTRAMUSCULAR | Status: AC
  Administered 2024-04-21: 40 mg via INTRAVENOUS
  Filled 2024-04-21: qty 4

## 2024-04-21 MED ORDER — PANTOPRAZOLE SODIUM 40 MG PO TBEC
40.0000 mg | DELAYED_RELEASE_TABLET | Freq: Two times a day (BID) | ORAL | Status: DC
  Administered 2024-04-21 – 2024-05-09 (×35): 40 mg via ORAL
  Filled 2024-04-21 (×36): qty 1

## 2024-04-21 MED ORDER — FUROSEMIDE 10 MG/ML IJ SOLN: 40.0000 mg | Freq: Two times a day (BID) | INTRAMUSCULAR | Status: DC

## 2024-04-21 MED ORDER — FUROSEMIDE 10 MG/ML IJ SOLN
40.0000 mg | Freq: Two times a day (BID) | INTRAMUSCULAR | Status: DC
  Administered 2024-04-22: 40 mg via INTRAVENOUS
  Filled 2024-04-21: qty 4

## 2024-04-21 NOTE — Progress Notes (Signed)
 Called to patient's room by bedside RN due to pt's desaturation.  Pt was having episodes of dsypnea and desaturation even with HFNC at 15 liters and a NRB mask in place.  Pt placed on HHFNC with fio2 .80 and flow 50 liters / minute.  O2 saturation is 96% and pt is breathing comfortably at this time, sitting on the side of the bed.  Will continue to monitor and titrate oxygen when possible.

## 2024-04-21 NOTE — Progress Notes (Signed)
 NAME:  Luis [name], MRN:  [id number], DOB:  [birth year], LOS: 14 ADMISSION DATE:  04/07/2024, CONSULTATION DATE:  10/30 REFERRING MD:  KC-TRH, CHIEF COMPLAINT:  acute resp failure   History of Present Illness:  [name] is an 83 y/o gentleman with a history of pulmonary sarcoidosis on chronic steriods, COPD-asthma overlap on triple inhaled therapy & Dupixent , stage IV cecal adenocarcinoma being treated at [facility] since 2019 who presented to the hospital with progressive shortness of breath and dry cough over 4 days on 04/07/2024.  He had been seen in pulmonary clinic today following his symptoms starting on 7/23.  He had an exacerbation of his respiratory disease in March and September of this year.  Since his exacerbation in September he had gone back on prednisone , tapering from 30 mg to 20 mg to 10 mg daily.  When he got back down to 10 mg daily dose he felt his symptoms flared up again, similar to tapers done in the 1990s when he was on chronic steroids for sarcoidosis.  He has only been on intermittent steroids since 1990s; he has not been on steroid sparing DMARD therapy due to quiescent sarcoidosis over the years.  He has not had sick contacts recently.  No new symptoms other than some leg swelling consistent with hospital.  No fevers.  He has had green sputum production in the hospital, but did not have sputum at home.  He is not on home oxygen.  He last had chemotherapy 2 weeks ago-pembrolizumab and 5-fluorouracil .  He felt well after this for a few days before starting to feel run down.  His meds are not to take primary support.  He is on Eliquis for DVT diagnosed in late July 2025 & Afib.  Patient hospital because required 4 to 6 L nasal cannula.  He has been maintained on azithromycin , ceftriaxone , prednisone  40mg  daily. Overall he felt a little better yesterday but is more dyspneic again today.   Pertinent  Medical History  Afib, recent cardioversion Metastatic adenocarcinoma of  cecum Pulmonary sarcoidosis diagnosed by open lung biopsy in 1990s COPD asthma overlap Coronary artery disease HTN HLD  Significant Hospital Events: Including procedures, antibiotic start and stop dates in addition to other pertinent events   10/26 admitted to the hospital, started on ceftriaxone  and azithromycin , prednisone  40 mg daily 10/29 completed azithromycin  10/30 stopped ceftriaxone  11/5 desaturation at 0400 overnight, transition to 4 L/min.  Then up to 10 L/min when he was moved to chair.  Now weaned to 6 L/min 11/6 CT-PA chest >> no PE, calcified hilar and mediastinal nodes, new mediastinal air without any evidence for pneumothorax.  Right lung base infiltrate unchanged, some ground glass on the left unchanged 11/9 lasix  held due to borderline hypotension 11/10 extra lasix   Interim History / Subjective:  Remains on 50L/min at 50%. Says he is breathing much better. Feels Good and thinks lasix  this AM helped.  Objective    Blood pressure (!) 125/55, pulse 74, temperature 98.4 F (36.9 C), temperature source Oral, resp. rate 20, height 5' 11 (1.803 m), weight 95.1 kg, SpO2 100%.    FiO2 (%):  [50 %-80 %] 50 %   Intake/Output Summary (Last 24 hours) at 04/21/2024 1725 Last data filed at 04/21/2024 9077 Gross per 24 hour  Intake 250 ml  Output 800 ml  Net -550 ml   Filed Weights   04/19/24 0442 04/20/24 0228 04/21/24 0458  Weight: 91 kg 93.4 kg 95.1 kg    Examination: General:  Chronically ill man, comfortable in bed on O2, talking in full sentences HENT: Somewhat hoarse voice, no secretions, oropharynx clear Lungs: Comfortable, scattered crackles and wheezes Cardiovascular: 2+ bilateral lower extremity pitting edema (improved).  Heart distant without a murmur Neuro: Awake and alert, appropriate, nonfocal   Resolved problem list   Assessment and Plan   Community-acquired pneumonia: Patchy infiltrates ground glass on the left, right basilar consolidation  superimposed on some old scar consistent with CAP.  BAL with greater than 50% neutrophils.  Concerning for suppurative, bacterial infection.  Cultures are negative, likely due to preceding antibiotics -Completed 14-day course ceftriaxone  11/9 - IS, pulmonary hygiene - Mobilize as able  Pneumomediastinum, new - Follow serial chest x-rays.  Likely no intervention needs to be made as long as stable.  He is at risk for evolving pneumothorax so discussed with him the importance of notifying caregivers for any change in breathing, chest pain, pleuritic pain, etc.  Multifactorial hypoxemic respiratory failure due to right lower lobe CAP, sarcoidosis with associated asthma, component of volume overload - Continue supplemental O2 as needed to maintain SpO2 > 90% - Continue weaning efforts - Resume diuresis today, 60mg  Lasix  now and then resume 40mg  BID starting tomorrow - Albumin 25% x 1 - Unna boots - Complete antibiotics as above - Continue Breztri  - Agree with prolonged prednisone  taper.  Hopefully he will wean off completely as an outpatient although he has required intermittent prednisone  dosing even at baseline - Suspect he will need oxygen at time of discharge  HFpEF, lower extremity swelling: Likely due to fluid administration this hospitalization.  Possibly prolonging hypoxemia. - Diuresis held 11/9 but resuming today given ongoing high oxygenation needs - Follow BMP  Asthma:  - Home regimen of Dupixent , Singulair , Breztri , albuterol .  Would be reasonable for him to have his Dupixent  (he is due).  He could bring in his home med for administration here - Plan for prednisone  taper off as an outpatient: Prednisone  taper to start on discharge: Prednisone  30 mg daily for 7 days, Prednisone  20 mg daily for 7 days, Prednisone  15 mg daily for 7 days, Prednisone  10 mg daily for 7 days, Prednisone  5 mg daily for 7 days, then stop   Jarelle Ates, PA - [facility] For pager details, please see AMION or use Epic chat  After 1900, please call ELINK for cross coverage needs 04/21/2024, 5:34 PM

## 2024-04-21 NOTE — Progress Notes (Signed)
 [facility] Progress Note Patient: Luis [name] FMW:[id number] DOB: 1941-02-15  DOA: 04/07/2024 DOS: the patient was seen and examined on 04/21/2024  Brief Hospital Course: Luis [name] is a 83 y.o. male with PMH of metastatic colon cancer, mets to lungs, liver, LN's, sarcoidosis, COPD, CAD hx PCI, HFpEF, hypertension, hyperlipidemia, hypothyroidism, who presented with progressive shortness of breath onset x 1 wk with exertional dyspnea, chest congestion, minimally productive cough with clear and scant blood-tinged sputum,  Currently being treated for pneumonia versus pneumonitis with acute hypoxic respiratory failure and acute on chronic HFpEF.   Assessment and Plan: Acute respiratory failure with hypoxia  Sarcoidosis flareup with long-term steroid use. Acute on chronic diastolic CHF. Presents with shortness of breath CT chest:Multifocal GGO/mosaic attenuation  LL- suspect multifocal infection/pneumonia-? pneumonitis related to immunotherapy Reports being on steroid for last 2 weeks 20 mg of prednisone  here needing 4 L [place] Eliquis PTA-less likelihood of PE. TTE done- EF 70 to 75% G1 DD RV SF is normal RV size is normal. Blood culture NGTD Currently on IV ceftriaxone  and oral doxycycline . Also on bronchodilators and steroids. S/P bronchoscopy BAL 10/31 - BAL culture-no growth to date AFB fungus PJP DFA- pending.  Cytology-atypical cells. Was on diuresis but due to hypotension currently on hold.  Now that the blood pressure is better and patient is more hypoxic will resume. Started on prednisone  taper.   Pneumomediastinum:  Will monitor closely.   Mild lactic acidosis due to respiratory distress   Acute on chronic HFpEF Chronic leg edema Hypokalemia: weight a week ago was 216 pound He has been intentionally losing weight before that he was 240.Weight at 219 pound on admission now 212>215> 205> 216>214 and has ongoing leg edema  Was on Lasix .  Now on hold due to  hypotension.   Elevated troponin: suspect demand ischemia with flat troponin. TTE is unremarkable   Hypokalemia Hypomagnesemia: Resolved   New onset type 2 diabetes with uncontrolled hyperglycemia: A1c at 7.1.  Initially cbg ~ 400-diet changed to carb controlled,started on glipizide and metformin ( will hold for now) cont ssi., Blood sugar fairly controlled.  Suspect in the setting of his steroid use as well   Metastatic colon cancer, R hemicolectomy, mets to lungs, liver, LN's: Followed by [facility], has known RUL nodule and opacities in the R base ? Lymphangitic spread v. Inflammation/infection (last in 6/'25). He is currently on maintenance 5FU and Panitumumab  every 2 weeks. Concern for immunotherapy mediated pneumonitis, patient on a steroid already as per pulmonary see above   Sarcoidosis: Follows with pulmonology, appears that he is currently on chronic steroids    pHTN: Suggested on imaging, noted    HTN HLD CAD hx PCI: Now with hypotension A-flutter. LFT elevation. Started on midodrine due to hypotension. Holding statin due to elevated LFT. If no improvement will have to stop dronedarone    Aflutter: Rate controlled. Cont home Apixaban, Dronedarone     Aortic ectasia: OP surveillance    Hypothyroidism: Continue home levothyroxine     Chronic anemia:  Stable at baseline.B12 and folate normal.   Thrombocytopenia: Monitor platelet count, improving.   Chronic pain: Rosacea: continue home Minocycline-after completing antibiotics, continue gabapentin .    Obesity Class 1 Body mass index is 29.24 kg/m.  Placing the pt at higher risk of poor outcomes.   Subjective: Ongoing shortness of breath.  Had significant respite distress this morning.  No chest pain or chest tightness.  Denies any choking episode.  Physical Exam: Bilateral basal crackles. Expiratory  wheezing. Bowel sound present Nontender. Bilateral lower extremity edema.  No calf tenderness.   Some redness of the right leg. Data Reviewed: I have Reviewed nursing notes, Vitals, and Lab results. Since last encounter, pertinent lab results CBC and BMP   . I have ordered test including CBC and BMP  . I have discussed pt's care plan and test results with pulmonary  .   Disposition: Status is: Inpatient Remains inpatient appropriate because: Requiring diuresis  Place TED hose Start: 04/19/24 0741 apixaban (ELIQUIS) tablet 5 mg   Family Communication: No one at bedside Level of care: Progressive   Vitals:   04/21/24 0841 04/21/24 1118 04/21/24 1234 04/21/24 1724  BP: 128/70  122/68 (!) 125/55  Pulse: 81  70 74  Resp: 20  (!) 24 20  Temp: (!) 97.5 F (36.4 C)   98.4 F (36.9 C)  TempSrc: Oral  Oral Oral  SpO2: 93% 100% 100% 100%  Weight:      Height:         Author: Yetta Blanch, MD 04/21/2024 6:13 PM  Please look on [email or web address] to find out who is on call.

## 2024-04-21 NOTE — Progress Notes (Signed)
 PT Cancellation Note  Patient Details Name: Luis [name] MRN: [id number] DOB: 09-Apr-1941   Cancelled Treatment:    Reason Eval/Treat Not Completed: (P) Medical issues which prohibited therapy (pt with decreased respiratory status and pending imaging, will need PT re-evaluation due to functional decline once pt medically appropriate.)   [name] 04/21/2024, 3:28 PM

## 2024-04-22 ENCOUNTER — Inpatient Hospital Stay (HOSPITAL_COMMUNITY)

## 2024-04-22 DIAGNOSIS — E8809 Other disorders of plasma-protein metabolism, not elsewhere classified: Secondary | ICD-10-CM | POA: Diagnosis not present

## 2024-04-22 DIAGNOSIS — J9601 Acute respiratory failure with hypoxia: Secondary | ICD-10-CM | POA: Diagnosis not present

## 2024-04-22 DIAGNOSIS — J45909 Unspecified asthma, uncomplicated: Secondary | ICD-10-CM | POA: Diagnosis not present

## 2024-04-22 DIAGNOSIS — D869 Sarcoidosis, unspecified: Secondary | ICD-10-CM | POA: Diagnosis not present

## 2024-04-22 LAB — CBC WITH DIFFERENTIAL/PLATELET
Abs Immature Granulocytes: 0.09 K/uL — ABNORMAL HIGH (ref 0.00–0.07)
Basophils Absolute: 0 K/uL (ref 0.0–0.1)
Basophils Relative: 0 %
Eosinophils Absolute: 0.1 K/uL (ref 0.0–0.5)
Eosinophils Relative: 1 %
HCT: 35.1 % — ABNORMAL LOW (ref 39.0–52.0)
Hemoglobin: 11.3 g/dL — ABNORMAL LOW (ref 13.0–17.0)
Immature Granulocytes: 1 %
Lymphocytes Relative: 3 %
Lymphs Abs: 0.3 K/uL — ABNORMAL LOW (ref 0.7–4.0)
MCH: 30.3 pg (ref 26.0–34.0)
MCHC: 32.2 g/dL (ref 30.0–36.0)
MCV: 94.1 fL (ref 80.0–100.0)
Monocytes Absolute: 0.9 K/uL (ref 0.1–1.0)
Monocytes Relative: 7 %
Neutro Abs: 11.3 K/uL — ABNORMAL HIGH (ref 1.7–7.7)
Neutrophils Relative %: 88 %
Platelets: 109 K/uL — ABNORMAL LOW (ref 150–400)
RBC: 3.73 MIL/uL — ABNORMAL LOW (ref 4.22–5.81)
RDW: 17.2 % — ABNORMAL HIGH (ref 11.5–15.5)
WBC: 12.7 K/uL — ABNORMAL HIGH (ref 4.0–10.5)
nRBC: 0 % (ref 0.0–0.2)

## 2024-04-22 LAB — GLUCOSE, CAPILLARY
Glucose-Capillary: 78 mg/dL (ref 70–99)
Glucose-Capillary: 221 mg/dL — ABNORMAL HIGH (ref 70–99)
Glucose-Capillary: 286 mg/dL — ABNORMAL HIGH (ref 70–99)
Glucose-Capillary: 142 mg/dL — ABNORMAL HIGH (ref 70–99)

## 2024-04-22 LAB — MAGNESIUM: Magnesium: 1.4 mg/dL — ABNORMAL LOW (ref 1.7–2.4)

## 2024-04-22 LAB — COMPREHENSIVE METABOLIC PANEL WITH GFR
ALT: 117 U/L — ABNORMAL HIGH (ref 0–44)
AST: 118 U/L — ABNORMAL HIGH (ref 15–41)
Albumin: 2.4 g/dL — ABNORMAL LOW (ref 3.5–5.0)
Alkaline Phosphatase: 713 U/L — ABNORMAL HIGH (ref 38–126)
Anion gap: 14 (ref 5–15)
BUN: 21 mg/dL (ref 8–23)
CO2: 28 mmol/L (ref 22–32)
Calcium: 8.1 mg/dL — ABNORMAL LOW (ref 8.9–10.3)
Chloride: 92 mmol/L — ABNORMAL LOW (ref 98–111)
Creatinine, Ser: 1.11 mg/dL (ref 0.61–1.24)
GFR, Estimated: >60 mL/min (ref >60)
Glucose, Bld: 83 mg/dL (ref 70–99)
Potassium: 3.4 mmol/L — ABNORMAL LOW (ref 3.5–5.1)
Sodium: 134 mmol/L — ABNORMAL LOW (ref 135–145)
Total Bilirubin: 1.1 mg/dL (ref 0.0–1.2)
Total Protein: 5 g/dL — ABNORMAL LOW (ref 6.5–8.1)

## 2024-04-22 MED ORDER — IPRATROPIUM-ALBUTEROL 0.5-2.5 (3) MG/3ML IN SOLN
RESPIRATORY_TRACT | Status: AC
  Administered 2024-04-22: 3 mL
  Filled 2024-04-22: qty 3

## 2024-04-22 MED ORDER — LORAZEPAM 0.5 MG PO TABS
0.5000 mg | ORAL_TABLET | Freq: Four times a day (QID) | ORAL | Status: DC | PRN
  Administered 2024-04-22 – 2024-04-28 (×8): 0.5 mg via ORAL
  Filled 2024-04-22 (×7): qty 1

## 2024-04-22 MED ORDER — BENZONATATE 100 MG PO CAPS
100.0000 mg | ORAL_CAPSULE | Freq: Three times a day (TID) | ORAL | Status: DC
  Administered 2024-04-22 – 2024-05-09 (×52): 100 mg via ORAL
  Filled 2024-04-22 (×53): qty 1

## 2024-04-22 MED ORDER — IPRATROPIUM-ALBUTEROL 0.5-2.5 (3) MG/3ML IN SOLN
3.0000 mL | Freq: Four times a day (QID) | RESPIRATORY_TRACT | Status: DC
  Administered 2024-04-22 – 2024-04-26 (×16): 3 mL via RESPIRATORY_TRACT
  Filled 2024-04-22 (×16): qty 3

## 2024-04-22 MED ORDER — DEXTROMETHORPHAN POLISTIREX ER 30 MG/5ML PO SUER
30.0000 mg | Freq: Two times a day (BID) | ORAL | Status: DC
  Administered 2024-04-22 – 2024-05-09 (×33): 30 mg via ORAL
  Filled 2024-04-22 (×39): qty 5

## 2024-04-22 MED ORDER — POTASSIUM CHLORIDE CRYS ER 20 MEQ PO TBCR
40.0000 meq | EXTENDED_RELEASE_TABLET | ORAL | Status: AC
  Administered 2024-04-22 (×2): 40 meq via ORAL
  Filled 2024-04-22 (×2): qty 2

## 2024-04-22 MED ORDER — MAGNESIUM SULFATE 4 GM/100ML IV SOLN
4.0000 g | Freq: Once | INTRAVENOUS | Status: AC
  Administered 2024-04-22: 4 g via INTRAVENOUS
  Filled 2024-04-22: qty 100

## 2024-04-22 MED ORDER — FUROSEMIDE 10 MG/ML IJ SOLN
40.0000 mg | Freq: Three times a day (TID) | INTRAMUSCULAR | Status: DC
  Administered 2024-04-22 – 2024-04-23 (×3): 40 mg via INTRAVENOUS
  Filled 2024-04-22 (×3): qty 4

## 2024-04-22 NOTE — Progress Notes (Signed)
 Occupational Therapy Treatment Patient Details Name: Luis [name] MRN: [id number] DOB: [birth year] Today's Date: 04/22/2024   History of present illness 83 y.o. male presents to [facility] hospital on 04/07/2024 with progressive SOB. Pt underwent bronchoscopy with biopsies 10/31. Symptomatic orthostatic hypotension during PT session 11/7. PMH includes metastatic colon CA with mets to lungs, liver, and lymph nodes, sarcoidosis, COPD, CAD, HFpEF, HTN, HLD, hypothyroidism.   OT comments  Pt at this time presented on [facility] and needed min assist with peri care following pt attempting to complete. Pt needed mod cues for positioning in session and required to go from 7L to 9L HFNC in session. (then respiratory entered) He agreed to step pivot to chair with close CGA and remain in chair for the next 30 min-hour as tolerated. At this time recommendation for OP vs HH depending on progress and requires to go up 5 steps to enter into the home.       If plan is discharge home, recommend the following:  A little help with walking and/or transfers;Direct supervision/assist for medications management;Assist for transportation;Help with stairs or ramp for entrance;Assistance with cooking/housework;A little help with bathing/dressing/bathroom   Equipment Recommendations  BSC/3in1;Tub/shower seat (WC vs transfer chair)    Recommendations for Other Services      Precautions / Restrictions Precautions Precautions: Fall Recall of Precautions/Restrictions: Intact Precaution/Restrictions Comments: orthostatic hypotension 11/7; DOE 3/4 Restrictions Weight Bearing Restrictions Per Provider Order: No       Mobility Bed Mobility               General bed mobility comments: presented on BSC    Transfers Overall transfer level: Needs assistance Equipment used: Rolling walker (2 wheels) Transfers: Sit to/from Stand Sit to Stand: Supervision                 Balance Overall balance assessment: Needs  assistance Sitting-balance support: Feet supported Sitting balance-Leahy Scale: Good     Standing balance support: Bilateral upper extremity supported, Single extremity supported Standing balance-Leahy Scale: Fair Standing balance comment: used rail on bed to pivot transfer to chair                           ADL either performed or assessed with clinical judgement   ADL Overall ADL's : Needs assistance/impaired Eating/Feeding: Independent;Sitting   Grooming: Wash/dry hands;Wash/dry face;Sitting;Set up   Upper Body Bathing: Set up;Sitting   Lower Body Bathing: Moderate assistance   Upper Body Dressing : Set up;Sitting   Lower Body Dressing: Sit to/from stand;Contact guard assist   Toilet Transfer: Contact guard assist;Stand-pivot;Cueing for sequencing;Cueing for safety   Toileting- Clothing Manipulation and Hygiene: Minimal assistance;Sit to/from stand       Functional mobility during ADLs: Contact guard assist;Cueing for sequencing;Cueing for safety;Rolling walker (2 wheels)      Extremity/Trunk Assessment Upper Extremity Assessment Upper Extremity Assessment: Generalized weakness   Lower Extremity Assessment Lower Extremity Assessment: Defer to PT evaluation        Vision       Perception Perception Perception: Within Functional Limits   Praxis Praxis Praxis: [facility]   Communication Communication Communication: No apparent difficulties   Cognition Arousal: Alert Behavior During Therapy: WFL for tasks assessed/performed Cognition: No apparent impairments                               Following commands: Intact  Cueing   Cueing Techniques: Verbal cues  Exercises      Shoulder Instructions       General Comments Pt on 7L HFNC and required 9L during session and then respitory therapy came in session    Pertinent Vitals/ Pain       Pain Assessment Pain Assessment: Faces Faces Pain Scale: Hurts a little bit Pain  Location: hemroids Pain Descriptors / Indicators: Discomfort Pain Intervention(s): Monitored during session  Home Living                                          Prior Functioning/Environment              Frequency  Min 2X/week        Progress Toward Goals  OT Goals(current goals can now be found in the care plan section)  Progress towards OT goals: Progressing toward goals  Acute Rehab OT Goals OT Goal Formulation: With patient Time For Goal Achievement: 05/06/24 Potential to Achieve Goals: Good ADL Goals Pt Will Perform Lower Body Bathing: with adaptive equipment;with set-up Pt Will Perform Lower Body Dressing: with min assist;with adaptive equipment Pt Will Transfer to Toilet: with supervision Pt Will Perform Toileting - Clothing Manipulation and hygiene: with supervision Additional ADL Goal #1: Pt able to return demonstration of EC strategies during ADL routine with supervision.  Plan      Co-evaluation                 AM-PAC OT 6 Clicks Daily Activity     Outcome Measure   Help from another person eating meals?: None Help from another person taking care of personal grooming?: A Little Help from another person toileting, which includes using toliet, bedpan, or urinal?: A Little Help from another person bathing (including washing, rinsing, drying)?: A Lot Help from another person to put on and taking off regular upper body clothing?: A Little Help from another person to put on and taking off regular lower body clothing?: A Lot 6 Click Score: 17    End of Session Equipment Utilized During Treatment: Oxygen  OT Visit Diagnosis: Muscle weakness (generalized) (M62.81)   Activity Tolerance Patient tolerated treatment well   Patient Left in chair;with call bell/phone within reach;with chair alarm set   Nurse Communication Mobility status        Time: 1118-1140 OT Time Calculation (min): 22 min  Charges: OT General  Charges $OT Visit: 1 Visit OT Treatments $Self Care/Home Management : 8-22 mins  Warrick POUR OTR/L  Acute Rehab Services  [phone] office number   Warrick Berber 04/22/2024, 11:48 AM

## 2024-04-22 NOTE — Progress Notes (Signed)
 [facility] Progress Note Patient: Luis [name] FMW:[id number] DOB: November 26, 1940  DOA: 04/07/2024 DOS: the patient was seen and examined on 04/22/2024  Brief Hospital Course: Luis [name] is a 83 y.o. male with PMH of metastatic colon cancer, mets to lungs, liver, LN's, sarcoidosis, COPD, CAD hx PCI, HFpEF, hypertension, hyperlipidemia, hypothyroidism, who presented with progressive shortness of breath onset x 1 wk with exertional dyspnea, chest congestion, minimally productive cough with clear and scant blood-tinged sputum,  Currently being treated for pneumonia versus pneumonitis with acute hypoxic respiratory failure and acute on chronic HFpEF.   Assessment and Plan: Acute respiratory failure with hypoxia  Sarcoidosis flareup with long-term steroid use. Acute on chronic diastolic CHF. Presents with shortness of breath CT chest:Multifocal GGO/mosaic attenuation  LL- suspect multifocal infection/pneumonia-? pneumonitis related to immunotherapy Reports being on steroid for last 2 weeks 20 mg of prednisone  here needing 4 L [place] Eliquis PTA-less likelihood of PE. TTE done- EF 70 to 75% G1 DD RV SF is normal RV size is normal. Blood culture NGTD Was on IV ceftriaxone  and oral doxycycline .  Now back on minocycline. Also on bronchodilators and steroids. S/P bronchoscopy BAL 10/31 - BAL culture-no growth to date AFB fungus PJP DFA- pending.  Cytology-atypical cells. Was on diuresis but due to hypotension currently on hold.  Now that the blood pressure is better and patient is more hypoxic will resume.  Continue aggressive diuresis.  Oxygenation improving. Started on prednisone  taper.   Pneumomediastinum:  Will monitor closely.   Mild lactic acidosis due to respiratory distress   Acute on chronic HFpEF Chronic leg edema Hypokalemia: weight a week ago was 216 pound He has been intentionally losing weight before that he was 240.Weight at 219 pound on admission now 212>215> 205>  216>214 and has ongoing leg edema  Continue Lasix .   Elevated troponin: suspect demand ischemia with flat troponin. TTE is unremarkable   Hypokalemia Hypomagnesemia: Resolved   New onset type 2 diabetes with uncontrolled hyperglycemia: A1c at 7.1.  Initially cbg ~ 400-diet changed to carb controlled,started on glipizide and metformin ( will hold for now) cont ssi., Blood sugar fairly controlled.  Suspect in the setting of his steroid use as well   Metastatic colon cancer, R hemicolectomy, mets to lungs, liver, LN's: Followed by [facility], has known RUL nodule and opacities in the R base ? Lymphangitic spread v. Inflammation/infection (last in 6/'25). He is currently on maintenance 5FU and Panitumumab  every 2 weeks. Concern for immunotherapy mediated pneumonitis, patient on a steroid already as per pulmonary see above   Sarcoidosis: Follows with pulmonology, appears that he is currently on chronic steroids    pHTN: Suggested on imaging, noted    HTN HLD CAD hx PCI: Now with hypotension A-flutter. LFT elevation. Started on midodrine due to hypotension. Holding statin due to elevated LFT. If no improvement will have to stop dronedarone    Aflutter: Rate controlled. Cont home Apixaban, Dronedarone     Aortic ectasia: OP surveillance    Hypothyroidism: Continue home levothyroxine     Chronic anemia:  Stable at baseline.B12 and folate normal.   Thrombocytopenia: Monitor platelet count, improving.   Chronic pain: Rosacea: continue home Minocycline-after completing antibiotics, continue gabapentin .    Obesity Class 1 Body mass index is 29.24 kg/m.  Placing the pt at higher risk of poor outcomes.   Subjective: Ongoing shortness of breath and cough.  No chest pain.  Physical Exam: Bilateral expiratory wheezing as well as crackles. Bowel sound present S1-S2 present Lower  extremity edema improving.  Data Reviewed: I have Reviewed nursing notes, Vitals, and Lab  results. Since last encounter, pertinent lab results CBC and BMP   . I have ordered test including CBC and BMP  . I have discussed pt's care plan and test results with pulmonary  .   Disposition: Status is: Inpatient Remains inpatient appropriate because: Monitor for improvement in volume status  Place TED hose Start: 04/19/24 0741 apixaban (ELIQUIS) tablet 5 mg   Family Communication: No one at bedside Level of care: Progressive   Vitals:   04/22/24 1140 04/22/24 1154 04/22/24 1440 04/22/24 1527  BP:  113/72  135/65  Pulse: 78 (!) 106  68  Resp: (!) 24 18 20  (!) 24  Temp:  98.1 F (36.7 C)  (!) 97.4 F (36.3 C)  TempSrc:  Oral  Oral  SpO2:  95%  97%  Weight:      Height:         Author: Yetta Blanch, MD 04/22/2024 7:01 PM  Please look on [email or web address] to find out who is on call.

## 2024-04-22 NOTE — Evaluation (Signed)
 Physical Therapy Re-Evaluation Patient Details Name: Luis [name] MRN: [id number] DOB: 07/31/1940 Today's Date: 04/22/2024  History of Present Illness  83 y.o. male presents to [facility] hospital on 04/07/2024 with progressive SOB. Pt underwent bronchoscopy with biopsies 10/31. Symptomatic orthostatic hypotension during PT session 11/7. Diuresis held 11/9 but resumed 11/10 due to ongoing high oxygenation needs. PMH includes metastatic colon CA with mets to lungs, liver, and lymph nodes, sarcoidosis, COPD, CAD, HFpEF, HTN, HLD, hypothyroidism.   Clinical Impression  Re-evaluated after decline in respiratory status and function. Patient able to transfer and pivot with CGA today but develops rapid onset dizziness, dyspnea, and drop in SpO2. Was unable to stand long enough to check BP, but upon sitting BP 101/64 (MAP 77). Standing SpO2 90% on 10L HFNC. HR in 70s. Fatigues rapidly, unable to take steps. Demonstrates good awareness of symptoms. Reviewed LE exercises encouraged OOB with nursing staff daily as tolerated.  Patient will continue to benefit from skilled physical therapy services to further improve independence with functional mobility. Goals updated as appropriate. Hopeful for a functional recovery prior to decline, mostly limited by hypotension and impaired respiratory status.      If plan is discharge home, recommend the following: Assistance with cooking/housework;Assist for transportation;Help with stairs or ramp for entrance;A little help with walking and/or transfers;A little help with bathing/dressing/bathroom   Can travel by private vehicle        Equipment Recommendations None recommended by PT;Other (comment) (likely to have supplemental O2 needs)  Recommendations for Other Services       Functional Status Assessment Patient has had a recent decline in their functional status and demonstrates the ability to make significant improvements in function in a reasonable and  predictable amount of time.     Precautions / Restrictions Precautions Precautions: Fall Recall of Precautions/Restrictions: Intact Precaution/Restrictions Comments: orthostatic hypotension 11/7; DOE 3/4 Restrictions Weight Bearing Restrictions Per Provider Order: No      Mobility  Bed Mobility               General bed mobility comments: in recliner    Transfers Overall transfer level: Needs assistance Equipment used: Rolling walker (2 wheels) Transfers: Sit to/from Stand, Bed to chair/wheelchair/BSC Sit to Stand: Contact guard assist   Step pivot transfers: Contact guard assist       General transfer comment: CGA for safety to stand and pivot with RW to steady. Fair control with descent back into chair. Reliant on RW for support    Ambulation/Gait               General Gait Details: Deferred due to dizziness, drop in sats, and rapid fatigue with dyspnea.  Stairs            Wheelchair Mobility     Tilt Bed    Modified Rankin (Stroke Patients Only)       Balance Overall balance assessment: Needs assistance Sitting-balance support: Feet supported Sitting balance-Leahy Scale: Good Sitting balance - Comments: sits edge of recliner without support   Standing balance support: Bilateral upper extremity supported, Single extremity supported Standing balance-Leahy Scale: Poor Standing balance comment: Bil UE support on RW, CGA for safety                             Pertinent Vitals/Pain Pain Assessment Pain Assessment: No/denies pain    Home Living Family/patient expects to be discharged to:: Private residence Living Arrangements: Spouse/significant other Available Help  at Discharge: Family;Available 24 hours/day Type of Home: House Home Access: Stairs to enter Entrance Stairs-Rails: Right Entrance Stairs-Number of Steps: 5   Home Layout: One level Home Equipment: Rollator (4 wheels)      Prior Function Prior Level of  Function : Independent/Modified Independent             Mobility Comments: ambulatory without DME in the home, PRN use of rollator in the community and yard       Extremity/Trunk Assessment   Upper Extremity Assessment Upper Extremity Assessment: Defer to OT evaluation    Lower Extremity Assessment Lower Extremity Assessment: Generalized weakness    Cervical / Trunk Assessment Cervical / Trunk Assessment: Normal  Communication   Communication Communication: No apparent difficulties    Cognition Arousal: Alert Behavior During Therapy: WFL for tasks assessed/performed   PT - Cognitive impairments: Safety/Judgement, Attention                         Following commands: Intact       Cueing Cueing Techniques: Verbal cues     General Comments General comments (skin integrity, edema, etc.): At rest SpO2 100% on 9L. With exertion (standing) SpO2 90% on 10L, 3/4 dyspnea. BP 101/64 after sitting back down - pt complains of dizziness in standing. Could not tolerate standing long enough to obtain standing BP. Pt reports TED hose were cut off of his legs last night due to edema and pain    Exercises General Exercises - Lower Extremity Ankle Circles/Pumps: AROM, Both, 10 reps, Seated Gluteal Sets: Strengthening, Both, 10 reps, Seated Long Arc Quad: Strengthening, Both, 10 reps, Seated Hip Flexion/Marching: Strengthening, 10 reps, Both, Seated   Assessment/Plan    PT Assessment Patient needs continued PT services  PT Problem List Decreased activity tolerance;Decreased strength;Decreased balance;Decreased mobility;Cardiopulmonary status limiting activity       PT Treatment Interventions DME instruction;Gait training;Stair training;Functional mobility training;Therapeutic activities;Therapeutic exercise;Balance training;Neuromuscular re-education;Patient/family education;Wheelchair mobility training    PT Goals (Current goals can be found in the Care Plan section)   Acute Rehab PT Goals Patient Stated Goal: Improve activity tolerance and not need oxygen PT Goal Formulation: With patient/family Time For Goal Achievement: 05/06/24 Potential to Achieve Goals: Fair    Frequency Min 2X/week     Co-evaluation               AM-PAC PT 6 Clicks Mobility  Outcome Measure Help needed turning from your back to your side while in a flat bed without using bedrails?: A Little Help needed moving from lying on your back to sitting on the side of a flat bed without using bedrails?: A Little Help needed moving to and from a bed to a chair (including a wheelchair)?: A Little Help needed standing up from a chair using your arms (e.g., wheelchair or bedside chair)?: A Little Help needed to walk in hospital room?: A Little Help needed climbing 3-5 steps with a railing? : Total 6 Click Score: 16    End of Session Equipment Utilized During Treatment: Oxygen Activity Tolerance: Treatment limited secondary to medical complications (Comment);Other (comment);Patient limited by fatigue (Dizziness, suspect othostatic but could not stand long enough to obtain.) Patient left: with call bell/phone within reach;in chair;with chair alarm set Nurse Communication: Mobility status PT Visit Diagnosis: Other abnormalities of gait and mobility (R26.89);Difficulty in walking, not elsewhere classified (R26.2);Muscle weakness (generalized) (M62.81)    Time: 8688-8668 PT Time Calculation (min) (ACUTE ONLY): 20 min  Charges:   PT Evaluation $PT Eval High Complexity: 1 High   PT General Charges $$ ACUTE PT VISIT: 1 Visit         Leontine Roads, PT, DPT [facility]  Rehabilitation Services Physical Therapist Office: [phone] Website: [email or web address]   Leontine GORMAN Roads 04/22/2024, 3:05 PM

## 2024-04-22 NOTE — Progress Notes (Signed)
 NAME:  Luis [name], MRN:  [id number], DOB:  22-Sep-1940, LOS: 15 ADMISSION DATE:  04/07/2024, CONSULTATION DATE:  10/30 REFERRING MD:  KC-TRH, CHIEF COMPLAINT:  acute resp failure   History of Present Illness:  Luis [name] is an 83 y/o gentleman with a history of pulmonary sarcoidosis on chronic steriods, COPD-asthma overlap on triple inhaled therapy & Dupixent , stage IV cecal adenocarcinoma being treated at [facility] since 2019 who presented to the hospital with progressive shortness of breath and dry cough over 4 days on 04/07/2024.  He had been seen in pulmonary clinic today following his symptoms starting on 7/23.  He had an exacerbation of his respiratory disease in March and September of this year.  Since his exacerbation in September he had gone back on prednisone , tapering from 30 mg to 20 mg to 10 mg daily.  When he got back down to 10 mg daily dose he felt his symptoms flared up again, similar to tapers done in the 1990s when he was on chronic steroids for sarcoidosis.  He has only been on intermittent steroids since 1990s; he has not been on steroid sparing DMARD therapy due to quiescent sarcoidosis over the years.  He has not had sick contacts recently.  No new symptoms other than some leg swelling consistent with hospital.  No fevers.  He has had green sputum production in the hospital, but did not have sputum at home.  He is not on home oxygen.  He last had chemotherapy 2 weeks ago-pembrolizumab and 5-fluorouracil .  He felt well after this for a few days before starting to feel run down.  His meds are not to take primary support.  He is on Eliquis for DVT diagnosed in late July 2025 & Afib.  Patient hospital because required 4 to 6 L nasal cannula.  He has been maintained on azithromycin , ceftriaxone , prednisone  40mg  daily. Overall he felt a little better yesterday but is more dyspneic again today.   Pertinent  Medical History  Afib, recent cardioversion Metastatic adenocarcinoma of  cecum Pulmonary sarcoidosis diagnosed by open lung biopsy in 1990s COPD asthma overlap Coronary artery disease HTN HLD  Significant Hospital Events: Including procedures, antibiotic start and stop dates in addition to other pertinent events   10/26 admitted to the hospital, started on ceftriaxone  and azithromycin , prednisone  40 mg daily 10/29 completed azithromycin  10/30 stopped ceftriaxone  11/5 desaturation at 0400 overnight, transition to 4 L/min.  Then up to 10 L/min when he was moved to chair.  Now weaned to 6 L/min 11/6 CT-PA chest >> no PE, calcified hilar and mediastinal nodes, new mediastinal air without any evidence for pneumothorax.  Right lung base infiltrate unchanged, some ground glass on the left unchanged 11/9 lasix  held due to borderline hypotension 11/10 extra lasix   Interim History / Subjective:  On 4L saturating 100%.  Patient is dyspneic.  On 4 L of oxygen.  He says he was not using any oxygen prior to coming to the hospital.  When he saw NP Hope mid-October his breathing was well and he was able to walk multiple blocks without any difficulty.  Objective    Blood pressure 135/65, pulse 68, temperature (!) 97.4 F (36.3 C), temperature source Oral, resp. rate (!) 24, height 5' 11 (1.803 m), weight 91.6 kg, SpO2 97%.    FiO2 (%):  [35 %-50 %] 35 %   Intake/Output Summary (Last 24 hours) at 04/22/2024 1618 Last data filed at 04/22/2024 1534 Gross per 24 hour  Intake 572.93 ml  Output 4200 ml  Net -3627.07 ml   Filed Weights   04/20/24 0228 04/21/24 0458 04/22/24 0334  Weight: 93.4 kg 95.1 kg 91.6 kg    Examination: General: Elderly male ill-appearing on oxygen and dyspneic. HENT: Moist dry mucous membrane Lungs: No wheezing heard today but had rales on posterior lung field auscultation. Cardiovascular: Pitting edema in legs as well as upper extremity.   Neuro: Awake and alert, appropriate, nonfocal   Resolved problem list   Assessment and Plan    Acute hypoxic respiratory failure: Ground glass opacities likely due to checkpoint inhibitor pneumonitis due to chronicity versus pulmonary edema: History of asthma on Dupixent : Stable sarcoidosis: Hypoalbuminemia: Multiple differentials for acute hypoxic respiratory failure as above.  Currently not wheezing.  Was started on steroid taper for checkpoint inhibitor pneumonitis. Echo 03/2024: EF 70-75% with grade 1 diastolic dysfunction. - Continue diuresis as possible.  - Trend BNP. -Dietitian to improve albumin. - Continue Breztri  - Continue with the prednisone  taper as outlined.  However if the patient has become euvolemic and still dyspneic we will need to consider trial of going up on steroids. [ Prednisone  30 mg daily for 7 days, Prednisone  20 mg daily for 7 days, Prednisone  15 mg daily for 7 days, Prednisone  10 mg daily for 7 days, Prednisone  5 mg daily for 7 days, then stop] - Suspect he will need oxygen at time of discharge - Outpatient Dupixent .  Community-acquired pneumonia: Patchy infiltrates ground glass on the left, right basilar consolidation superimposed on some old scar consistent with CAP.  BAL with greater than 50% neutrophils.  Concerning for suppurative, bacterial infection.  Cultures are negative, likely due to preceding antibiotics -Completed 14-day course ceftriaxone  11/9 - IS, pulmonary hygiene - Mobilize as able  Pneumomediastinum, new - Improving pneumomediastinum. - Continue monitor.  Pulmonary will follow.  I personally spent a total of 55 minutes in the care of the patient today including preparing to see the patient, getting/reviewing separately obtained history, performing a medically appropriate exam/evaluation, counseling and educating, referring and communicating with other health care professionals, documenting clinical information in the EHR, and independently interpreting results.   Sammi Fredericks, MD.

## 2024-04-22 NOTE — Plan of Care (Signed)

## 2024-04-23 ENCOUNTER — Inpatient Hospital Stay (HOSPITAL_COMMUNITY)

## 2024-04-23 DIAGNOSIS — J9601 Acute respiratory failure with hypoxia: Secondary | ICD-10-CM | POA: Diagnosis not present

## 2024-04-23 LAB — COMPREHENSIVE METABOLIC PANEL WITH GFR
ALT: 136 U/L — ABNORMAL HIGH (ref 0–44)
AST: 150 U/L — ABNORMAL HIGH (ref 15–41)
Albumin: 2.6 g/dL — ABNORMAL LOW (ref 3.5–5.0)
Alkaline Phosphatase: 879 U/L — ABNORMAL HIGH (ref 38–126)
Anion gap: 15 (ref 5–15)
BUN: 25 mg/dL — ABNORMAL HIGH (ref 8–23)
CO2: 29 mmol/L (ref 22–32)
Calcium: 8.2 mg/dL — ABNORMAL LOW (ref 8.9–10.3)
Chloride: 92 mmol/L — ABNORMAL LOW (ref 98–111)
Creatinine, Ser: 1.25 mg/dL — ABNORMAL HIGH (ref 0.61–1.24)
GFR, Estimated: 57 mL/min — ABNORMAL LOW (ref >60)
Glucose, Bld: 99 mg/dL (ref 70–99)
Potassium: 4 mmol/L (ref 3.5–5.1)
Sodium: 136 mmol/L (ref 135–145)
Total Bilirubin: 1.6 mg/dL — ABNORMAL HIGH (ref 0.0–1.2)
Total Protein: 5.4 g/dL — ABNORMAL LOW (ref 6.5–8.1)

## 2024-04-23 LAB — CBC WITH DIFFERENTIAL/PLATELET
Abs Immature Granulocytes: 0.17 K/uL — ABNORMAL HIGH (ref 0.00–0.07)
Basophils Absolute: 0 K/uL (ref 0.0–0.1)
Basophils Relative: 0 %
Eosinophils Absolute: 0.1 K/uL (ref 0.0–0.5)
Eosinophils Relative: 1 %
HCT: 38.9 % — ABNORMAL LOW (ref 39.0–52.0)
Hemoglobin: 12.5 g/dL — ABNORMAL LOW (ref 13.0–17.0)
Immature Granulocytes: 1 %
Lymphocytes Relative: 3 %
Lymphs Abs: 0.4 K/uL — ABNORMAL LOW (ref 0.7–4.0)
MCH: 30.2 pg (ref 26.0–34.0)
MCHC: 32.1 g/dL (ref 30.0–36.0)
MCV: 94 fL (ref 80.0–100.0)
Monocytes Absolute: 1.1 K/uL — ABNORMAL HIGH (ref 0.1–1.0)
Monocytes Relative: 7 %
Neutro Abs: 13.1 K/uL — ABNORMAL HIGH (ref 1.7–7.7)
Neutrophils Relative %: 88 %
Platelets: 123 K/uL — ABNORMAL LOW (ref 150–400)
RBC: 4.14 MIL/uL — ABNORMAL LOW (ref 4.22–5.81)
RDW: 17.6 % — ABNORMAL HIGH (ref 11.5–15.5)
WBC: 14.9 K/uL — ABNORMAL HIGH (ref 4.0–10.5)
nRBC: 0.1 % (ref 0.0–0.2)

## 2024-04-23 LAB — GLUCOSE, CAPILLARY
Glucose-Capillary: 102 mg/dL — ABNORMAL HIGH (ref 70–99)
Glucose-Capillary: 167 mg/dL — ABNORMAL HIGH (ref 70–99)
Glucose-Capillary: 152 mg/dL — ABNORMAL HIGH (ref 70–99)
Glucose-Capillary: 169 mg/dL — ABNORMAL HIGH (ref 70–99)

## 2024-04-23 LAB — MAGNESIUM: Magnesium: 2.1 mg/dL (ref 1.7–2.4)

## 2024-04-23 MED ORDER — FUROSEMIDE 10 MG/ML IJ SOLN
40.0000 mg | Freq: Every day | INTRAMUSCULAR | Status: DC
  Administered 2024-04-24: 40 mg via INTRAVENOUS
  Filled 2024-04-23: qty 4

## 2024-04-23 MED ORDER — ALBUMIN HUMAN 25 % IV SOLN
12.5000 g | Freq: Once | INTRAVENOUS | Status: AC
  Administered 2024-04-23: 12.5 g via INTRAVENOUS
  Filled 2024-04-23: qty 50

## 2024-04-23 NOTE — Plan of Care (Signed)

## 2024-04-23 NOTE — Progress Notes (Signed)
 Orthopedic Tech Progress Note Patient Details:  MIRON MARXEN 12/18/1940 [id number]  Ortho Devices Type of Ortho Device: Radio broadcast assistant Ortho Device/Splint Location: BLE Ortho Device/Splint Interventions: Ordered, Application, Adjustment   Post Interventions Patient Tolerated: Well  Adine MARLA Blush 04/23/2024, 11:28 AM

## 2024-04-23 NOTE — Progress Notes (Signed)
 [facility] Progress Note Patient: Luis [name] FMW:[id number] DOB: 17-Feb-1941  DOA: 04/07/2024 DOS: the patient was seen and examined on 04/23/2024  Brief Hospital Course: ALMALIK WEISSBERG is a 83 y.o. male with PMH of metastatic colon cancer, mets to lungs, liver, LN's, sarcoidosis, COPD, CAD hx PCI, HFpEF, hypertension, hyperlipidemia, hypothyroidism, who presented with progressive shortness of breath onset x 1 wk with exertional dyspnea, chest congestion, minimally productive cough with clear and scant blood-tinged sputum,  Currently being treated for pneumonia versus pneumonitis with acute hypoxic respiratory failure and acute on chronic HFpEF.   Assessment and Plan: Acute respiratory failure with hypoxia  Sarcoidosis flareup with long-term steroid use. Acute on chronic diastolic CHF. Presents with shortness of breath CT chest:Multifocal GGO/mosaic attenuation  LL- suspect multifocal infection/pneumonia-? pneumonitis related to immunotherapy Reports being on steroid for last 2 weeks 20 mg of prednisone  here needing 4 L [place] ON Eliquis PTA-less likelihood of PE. TTE done- EF 70 to 75% G1 DD RV SF is normal RV size is normal. Blood culture NGTD Was on IV ceftriaxone  and oral doxycycline .  Now back on minocycline. Also on bronchodilators and steroids. S/P bronchoscopy BAL 10/31 - BAL culture-no growth to date AFB fungus PJP DFA- pending.  Cytology-atypical cells. Currently on Lasix  and prednisone  taper.   Pneumomediastinum:  Will monitor closely.   Mild lactic acidosis due to respiratory distress   Acute on chronic HFpEF Chronic leg edema Hypokalemia: weight a week ago was 216 pound He has been intentionally losing weight before that he was 240.Weight at 219 pound on admission now 212>215> 205> 216>214 and has ongoing leg edema  Continue Lasix .   Elevated troponin: suspect demand ischemia with flat troponin. TTE is unremarkable   Hypokalemia Hypomagnesemia: Resolved    New onset type 2 diabetes with uncontrolled hyperglycemia: A1c at 7.1.  Initially cbg ~ 400-diet changed to carb controlled,started on glipizide and metformin ( will hold for now) cont ssi., Blood sugar fairly controlled.  Suspect in the setting of his steroid use as well   Metastatic colon cancer, R hemicolectomy, mets to lungs, liver, LN's: Followed by [facility], has known RUL nodule and opacities in the R base ? Lymphangitic spread v. Inflammation/infection (last in 6/'25). He is currently on maintenance 5FU and Panitumumab  every 2 weeks. Concern for immunotherapy mediated pneumonitis, patient on a steroid already as per pulmonary see above   Sarcoidosis: Follows with pulmonology, appears that he is currently on chronic steroids    pHTN: Suggested on imaging, noted    HTN HLD CAD hx PCI: Now with hypotension A-flutter. LFT elevation. Started on midodrine due to hypotension. Holding statin due to elevated LFT. If no improvement will have to stop dronedarone    Aflutter: Rate controlled. Cont home Apixaban, Dronedarone     Aortic ectasia: OP surveillance    Hypothyroidism: Continue home levothyroxine     Chronic anemia:  Stable at baseline.B12 and folate normal.   Thrombocytopenia: Monitor platelet count, improving.   Chronic pain: Rosacea: continue home Minocycline-after completing antibiotics, continue gabapentin .    Obesity Class 1 Body mass index is 29.24 kg/m.  Placing the pt at higher risk of poor outcomes.   Subjective: No nausea no vomiting.  Ongoing shortness of breath and cough.  Feeling better.  Physical Exam: Bilateral basal crackles. S1-S2 present Bowel sounds are normal Lower extremity edema seen.  Data Reviewed: I have Reviewed nursing notes, Vitals, and Lab results. Since last encounter, pertinent lab results CBC and BMP   . I have  ordered test including CBC and BMP  .   Disposition: Status is: Inpatient Remains inpatient appropriate  because: Monitor for improvement in volume status  Place TED hose Start: 04/19/24 0741 apixaban (ELIQUIS) tablet 5 mg    Family Communication: No one at bedside Level of care: Progressive   Vitals:   04/23/24 1113 04/23/24 1133 04/23/24 1503 04/23/24 1606  BP: (!) 144/77   (!) 170/84  Pulse: 81  80 80  Resp: (!) 21 18 20 20   Temp: (!) 97.5 F (36.4 C)   97.6 F (36.4 C)  TempSrc: Oral   Oral  SpO2: 98%   94%  Weight:      Height:         Author: Yetta Blanch, MD 04/23/2024 7:15 PM  Please look on [email or web address] to find out who is on call.

## 2024-04-24 ENCOUNTER — Inpatient Hospital Stay (HOSPITAL_COMMUNITY)

## 2024-04-24 DIAGNOSIS — D849 Immunodeficiency, unspecified: Secondary | ICD-10-CM

## 2024-04-24 DIAGNOSIS — Z85038 Personal history of other malignant neoplasm of large intestine: Secondary | ICD-10-CM

## 2024-04-24 DIAGNOSIS — J188 Other pneumonia, unspecified organism: Secondary | ICD-10-CM | POA: Diagnosis not present

## 2024-04-24 DIAGNOSIS — J9601 Acute respiratory failure with hypoxia: Secondary | ICD-10-CM | POA: Diagnosis not present

## 2024-04-24 DIAGNOSIS — R7401 Elevation of levels of liver transaminase levels: Secondary | ICD-10-CM

## 2024-04-24 LAB — CBC WITH DIFFERENTIAL/PLATELET
Abs Immature Granulocytes: 0.13 K/uL — ABNORMAL HIGH (ref 0.00–0.07)
Basophils Absolute: 0 K/uL (ref 0.0–0.1)
Basophils Relative: 0 %
Eosinophils Absolute: 0.1 K/uL (ref 0.0–0.5)
Eosinophils Relative: 1 %
HCT: 38.9 % — ABNORMAL LOW (ref 39.0–52.0)
Hemoglobin: 12.5 g/dL — ABNORMAL LOW (ref 13.0–17.0)
Immature Granulocytes: 1 %
Lymphocytes Relative: 5 %
Lymphs Abs: 0.7 K/uL (ref 0.7–4.0)
MCH: 30 pg (ref 26.0–34.0)
MCHC: 32.1 g/dL (ref 30.0–36.0)
MCV: 93.5 fL (ref 80.0–100.0)
Monocytes Absolute: 1.1 K/uL — ABNORMAL HIGH (ref 0.1–1.0)
Monocytes Relative: 7 %
Neutro Abs: 12.2 K/uL — ABNORMAL HIGH (ref 1.7–7.7)
Neutrophils Relative %: 86 %
Platelets: 144 K/uL — ABNORMAL LOW (ref 150–400)
RBC: 4.16 MIL/uL — ABNORMAL LOW (ref 4.22–5.81)
RDW: 17.6 % — ABNORMAL HIGH (ref 11.5–15.5)
WBC: 14.2 K/uL — ABNORMAL HIGH (ref 4.0–10.5)
nRBC: 0 % (ref 0.0–0.2)

## 2024-04-24 LAB — COMPREHENSIVE METABOLIC PANEL WITH GFR
ALT: 122 U/L — ABNORMAL HIGH (ref 0–44)
AST: 123 U/L — ABNORMAL HIGH (ref 15–41)
Albumin: 2.8 g/dL — ABNORMAL LOW (ref 3.5–5.0)
Alkaline Phosphatase: 887 U/L — ABNORMAL HIGH (ref 38–126)
Anion gap: 13 (ref 5–15)
BUN: 30 mg/dL — ABNORMAL HIGH (ref 8–23)
CO2: 28 mmol/L (ref 22–32)
Calcium: 8.6 mg/dL — ABNORMAL LOW (ref 8.9–10.3)
Chloride: 89 mmol/L — ABNORMAL LOW (ref 98–111)
Creatinine, Ser: 1.47 mg/dL — ABNORMAL HIGH (ref 0.61–1.24)
GFR, Estimated: 47 mL/min — ABNORMAL LOW (ref >60)
Glucose, Bld: 135 mg/dL — ABNORMAL HIGH (ref 70–99)
Potassium: 4.6 mmol/L (ref 3.5–5.1)
Sodium: 130 mmol/L — ABNORMAL LOW (ref 135–145)
Total Bilirubin: 1.8 mg/dL — ABNORMAL HIGH (ref 0.0–1.2)
Total Protein: 5.5 g/dL — ABNORMAL LOW (ref 6.5–8.1)

## 2024-04-24 LAB — GLUCOSE, CAPILLARY
Glucose-Capillary: 102 mg/dL — ABNORMAL HIGH (ref 70–99)
Glucose-Capillary: 222 mg/dL — ABNORMAL HIGH (ref 70–99)
Glucose-Capillary: 140 mg/dL — ABNORMAL HIGH (ref 70–99)
Glucose-Capillary: 163 mg/dL — ABNORMAL HIGH (ref 70–99)
Glucose-Capillary: 121 mg/dL — ABNORMAL HIGH (ref 70–99)

## 2024-04-24 LAB — MAGNESIUM: Magnesium: 1.9 mg/dL (ref 1.7–2.4)

## 2024-04-24 NOTE — Progress Notes (Signed)
 This AM, pt with large formed BM in BSC with frank red blood surrounding it. Pt endorsed he had internal hemorrhoids and that they sometimes bleed. Pt counciled on the benefits of taking stool softeners (has been refusing), specifically that they would help to make his stool softer and less prone to irritate his hemorrhoids. Pt endorsed understanding and agreed he would take the next scheduled doses. Will convey to Day RN and continue to monitor.

## 2024-04-24 NOTE — Progress Notes (Signed)
 Pt had a large BM and it was surrounded by a large amount of darker and frank red blood, MD Amin notified and will talk to GI.   Laneta JAYSON Rao, RN 04/24/2024 12:58 PM

## 2024-04-24 NOTE — Progress Notes (Signed)
 [facility] Progress Note Patient: Luis [name] FMW:[id number] DOB: 23-Oct-1940  DOA: 04/07/2024 DOS: the patient was seen and examined on 04/24/2024  Brief Hospital Course: Luis [name] is a 83 y.o. male with PMH of metastatic colon cancer, mets to lungs, liver, LN's, sarcoidosis, COPD, CAD hx PCI, HFpEF, hypertension, hyperlipidemia, hypothyroidism, who presented with progressive shortness of breath onset x 1 wk with exertional dyspnea, chest congestion, minimally productive cough with clear and scant blood-tinged sputum,  Currently being treated for pneumonia versus pneumonitis with acute hypoxic respiratory failure and acute on chronic HFpEF.  11/13: Mildly elevated blood pressure, still significant transaminitis-message sent to Dr. Clancy cardiologist regarding Multaq . Had 1 episode of bleeding while having bowel movement-history of hemorrhoid.   Assessment and Plan: Acute respiratory failure with hypoxia  Sarcoidosis flareup with long-term steroid use. Acute on chronic diastolic CHF. Presents with shortness of breath CT chest:Multifocal GGO/mosaic attenuation  LL- suspect multifocal infection/pneumonia-? pneumonitis related to immunotherapy Reports being on steroid for last 2 weeks 20 mg of prednisone  here needing 4 L [place] ON Eliquis PTA-less likelihood of PE. TTE done- EF 70 to 75% G1 DD RV SF is normal RV size is normal. Blood culture NGTD Was on IV ceftriaxone  and oral doxycycline .  Now back on minocycline. Also on bronchodilators and steroids. S/P bronchoscopy BAL 10/31 - BAL culture-no growth to date AFB fungus PJP DFA- pending.  Cytology-atypical cells. Continue with prednisone  taper. Holding IV Lasix  due to increasing creatinine   Pneumomediastinum:  Will monitor closely.   Mild lactic acidosis due to respiratory distress   Acute on chronic HFpEF Chronic leg edema Hypokalemia: weight a week ago was 216 pound He has been intentionally losing weight before  that he was 240.Weight at 219 pound on admission now 212>215> 205> 216>214 and has ongoing leg edema  Holding IV Lasix  due to increase in creatinine   Elevated troponin: suspect demand ischemia with flat troponin. TTE is unremarkable  Bleeding per rectum.  Patient had 1 episode of bleeding with bowel movement, history of internal hemorrhoids stating that he sometimes bleeds. GI was initially consulted on admission and later signed off stating that it is either likely internal hemorrhoid or maybe some element of radiation proctitis. - Monitor hemoglobin -We will reconsult GI if bleeding persist   Hypokalemia Hypomagnesemia: Resolved   New onset type 2 diabetes with uncontrolled hyperglycemia: A1c at 7.1.  Initially cbg ~ 400-diet changed to carb controlled,started on glipizide and metformin ( will hold for now) cont ssi., Blood sugar fairly controlled.  Suspect in the setting of his steroid use as well   Metastatic colon cancer, R hemicolectomy, mets to lungs, liver, LN's: Followed by [facility], has known RUL nodule and opacities in the R base ? Lymphangitic spread v. Inflammation/infection (last in 6/'25). He is currently on maintenance 5FU and Panitumumab  every 2 weeks. Concern for immunotherapy mediated pneumonitis, patient on a steroid already as per pulmonary see above   Sarcoidosis: Follows with pulmonology, appears that he is currently on chronic steroids    pHTN: Suggested on imaging, noted    HTN HLD CAD hx PCI: Now with hypotension A-flutter. LFT elevation. Started on midodrine due to hypotension. Holding statin due to elevated LFT. If no improvement will have to stop dronedarone -due to persistently elevated LFTs, message sent to his cardiologist regarding Multaq    Aflutter: Rate controlled. Cont home Apixaban, Dronedarone     Aortic ectasia: OP surveillance    Hypothyroidism: Continue home levothyroxine     Chronic anemia:  Stable at baseline.B12 and  folate normal.   Thrombocytopenia: Monitor platelet count, improving.   Chronic pain: Rosacea: continue home Minocycline-after completing antibiotics, continue gabapentin .    Obesity Class 1 Body mass index is 29.24 kg/m.  Placing the pt at higher risk of poor outcomes.   Subjective: Patient was seen and examined today.  Stating that he is not getting better, unable to explain any symptoms. Had some bleeding with BM today.  Physical Exam: General.  Frail elderly man, in no acute distress. Pulmonary.  Lungs clear bilaterally, normal respiratory effort. CV.  Regular rate and rhythm, no JVD, rub or murmur. Abdomen.  Soft, nontender, nondistended, BS positive. CNS.  Alert and oriented .  No focal neurologic deficit. Extremities.  1+ LE edema pulses intact and symmetrical.  Lower extremities wrapped Psychiatry.  Judgment and insight appears normal.   Data Reviewed: I have Reviewed nursing notes, Vitals, and Lab results. Since last encounter, pertinent lab results CBC and BMP   . I have ordered test including CBC and BMP  .   Disposition: Status is: Inpatient Remains inpatient appropriate because: Monitor for improvement in volume status  Place TED hose Start: 04/19/24 0741 apixaban (ELIQUIS) tablet 5 mg    Family Communication: No one at bedside Level of care: Progressive   Vitals:   04/24/24 0500 04/24/24 0751 04/24/24 0821 04/24/24 1251  BP:   (!) 146/94 (!) 165/83  Pulse:   76 95  Resp:   20 18  Temp:   97.8 F (36.6 C) (!) 97.4 F (36.3 C)  TempSrc:   Oral Oral  SpO2:  96% 93% 98%  Weight: 91.1 kg     Height:       This record has been created using Conservation officer, historic buildings. Errors have been sought and corrected,but may not always be located. Such creation errors do not reflect on the standard of care.   Author: Amaryllis Dare, MD 04/24/2024 3:11 PM  Please look on [email or web address] to find out who is on call.

## 2024-04-24 NOTE — Plan of Care (Signed)

## 2024-04-24 NOTE — Progress Notes (Signed)
 NAME:  Luis [name], MRN:  [id number], DOB:  1940-10-10, LOS: 17 ADMISSION DATE:  04/07/2024, CONSULTATION DATE:  10/30 REFERRING MD:  KC-TRH, CHIEF COMPLAINT:  acute resp failure   History of Present Illness:  [name] is an 83 y/o gentleman with a history of pulmonary sarcoidosis on chronic steriods, COPD-asthma overlap on triple inhaled therapy & Dupixent , stage IV cecal adenocarcinoma being treated at [facility] since 2019 who presented to the hospital with progressive shortness of breath and dry cough over 4 days on 04/07/2024.  He had been seen in pulmonary clinic today following his symptoms starting on 7/23.  He had an exacerbation of his respiratory disease in March and September of this year.  Since his exacerbation in September he had gone back on prednisone , tapering from 30 mg to 20 mg to 10 mg daily.  When he got back down to 10 mg daily dose he felt his symptoms flared up again, similar to tapers done in the 1990s when he was on chronic steroids for sarcoidosis.  He has only been on intermittent steroids since 1990s; he has not been on steroid sparing DMARD therapy due to quiescent sarcoidosis over the years.  He has not had sick contacts recently.  No new symptoms other than some leg swelling consistent with hospital.  No fevers.  He has had green sputum production in the hospital, but did not have sputum at home.  He is not on home oxygen.  He last had chemotherapy 2 weeks ago-pembrolizumab and 5-fluorouracil .  He felt well after this for a few days before starting to feel run down.  His meds are not to take primary support.  He is on Eliquis for DVT diagnosed in late July 2025 & Afib.  Patient hospital because required 4 to 6 L nasal cannula.  He has been maintained on azithromycin , ceftriaxone , prednisone  40mg  daily. Overall he felt a little better yesterday but is more dyspneic again today.   Pertinent  Medical History  Afib, recent cardioversion Metastatic adenocarcinoma of  cecum Pulmonary sarcoidosis diagnosed by open lung biopsy in 1990s COPD asthma overlap Coronary artery disease HTN HLD  Significant Hospital Events: Including procedures, antibiotic start and stop dates in addition to other pertinent events   10/26 admitted to the hospital, started on ceftriaxone  and azithromycin , prednisone  40 mg daily 10/29 completed azithromycin  10/30 stopped ceftriaxone  11/5 desaturation at 0400 overnight, transition to 4 L/min.  Then up to 10 L/min when he was moved to chair.  Now weaned to 6 L/min 11/6 CT-PA chest >> no PE, calcified hilar and mediastinal nodes, new mediastinal air without any evidence for pneumothorax.  Right lung base infiltrate unchanged, some ground glass on the left unchanged 11/9 lasix  held due to borderline hypotension 11/10 extra lasix   Interim History / Subjective:  Remains very dyspneic with exertion. Weaned to 2L O2.  Objective    Blood pressure (!) 165/83, pulse 95, temperature (!) 97.4 F (36.3 C), temperature source Oral, resp. rate 18, height 5' 11 (1.803 m), weight 91.1 kg, SpO2 98%.    FiO2 (%):  [28 %] 28 %   Intake/Output Summary (Last 24 hours) at 04/24/2024 1427 Last data filed at 04/24/2024 1254 Gross per 24 hour  Intake 730 ml  Output 2000 ml  Net -1270 ml   Filed Weights   04/22/24 0334 04/23/24 0340 04/24/24 0500  Weight: 91.6 kg 91.5 kg 91.1 kg    Examination: General: Elderly male ill-appearing on oxygen and dyspneic. HENT: Moist dry  mucous membrane Lungs: No wheezing, rales at bases Cardiovascular: rrr, legs wrapped Neuro: Awake and alert, appropriate, nonfocal   Resolved problem list   Assessment and Plan   Acute hypoxic respiratory failure: Ground glass opacities likely due to checkpoint inhibitor pneumonitis due to chronicity versus pulmonary edema: History of asthma on Dupixent : Stable sarcoidosis: Hypoalbuminemia: Multiple differentials for acute hypoxic respiratory failure as above.   Currently not wheezing.  Was started on steroid taper for checkpoint inhibitor pneumonitis. Echo 03/2024: EF 70-75% with grade 1 diastolic dysfunction. - hold diuresis given renal function rise - Trend BNP. - Continue Breztri  - Continue with the prednisone  taper as outlined.  [ Prednisone  30 mg daily for 7 days, Prednisone  20 mg daily for 7 days, Prednisone  15 mg daily for 7 days, Prednisone  10 mg daily for 7 days, Prednisone  5 mg daily for 7 days, then stop] - Suspect he will need oxygen at time of discharge - Outpatient Dupixent .  Community-acquired pneumonia: Patchy infiltrates ground glass on the left, right basilar consolidation superimposed on some old scar consistent with CAP.  BAL with greater than 50% neutrophils.  Concerning for suppurative, bacterial infection.  Cultures are negative, likely due to preceding antibiotics -Completed 14-day course ceftriaxone  11/9 - IS, pulmonary hygiene - Mobilize as able  Pneumomediastinum, new - Improving pneumomediastinum. - Continue monitor.  PCCM will sign off, please call if needed.   Dorn Chill, MD [facility] Office: [phone]   See Amion for personal pager PCCM on call pager [phone] until 7pm. Please call Elink 7p-7a. [phone]

## 2024-04-24 NOTE — Progress Notes (Signed)
 Physical Therapy Treatment Patient Details Name: Luis [name] MRN: [id number] DOB: Sep 20, 1940 Today's Date: 04/24/2024   History of Present Illness 83 y.o. male presents to [facility] hospital on 04/07/2024 with progressive SOB. Pt underwent bronchoscopy with biopsies 10/31. Symptomatic orthostatic hypotension during PT session 11/7. Diuresis held 11/9 but resumed 11/10 due to ongoing high oxygenation needs. PMH includes metastatic colon CA with mets to lungs, liver, and lymph nodes, sarcoidosis, COPD, CAD, HFpEF, HTN, HLD, hypothyroidism. (Simultaneous filing. User may not have seen previous data.)    PT Comments  Pt received in recliner, agreeable to therapy session with plan to work on improving standing tolerance and BP assessment. Pt with orthostatic hypotension and DOE 3/4 with functional tasks this date, quick to fatigue, needing to sit abruptly once standing BP completed. Pt not able to stand for second (3 minute) standing BP assessment after initial standing BP reading taken. Given functional decline and increased physical assist needed, disposition updated below, pending pt progress. May need to consider post-acute therapies depending on VS and standing activity tolerance in future sessions, disposition and DME updated below per discussion with supervising PT Logan B, pt requesting to think about post-acute PT before deciding. Orthostatic Lying   BP- Lying (!) 137/98 (112) (Reclined in chair)  Pulse- Lying 72  Orthostatic Sitting  BP- Sitting 115/78 (91)  Pulse- Sitting 77  Orthostatic Standing at 0 minutes  BP- Standing at 0 minutes 104/84 (91)  Pulse- Standing at 0 minutes 79   BP 111/80  BP Location Right Arm  BP Method Automatic  Patient Position (if appropriate) Sitting (After standing BP taken, with pt sitting EOB)     If plan is discharge home, recommend the following: Assistance with cooking/housework;Assist for transportation;Help with stairs or ramp for entrance;A little  help with walking and/or transfers;A little help with bathing/dressing/bathroom   Can travel by private vehicle        Equipment Recommendations  Other (comment);Wheelchair cushion (measurements PT);Wheelchair (measurements PT);BSC/3in1 (pending progress; pt has rollator at home, also likely to need supplemental O2)    Recommendations for Other Services       Precautions / Restrictions Precautions Precautions: Fall Recall of Precautions/Restrictions: Intact Precaution/Restrictions Comments: orthostatic 11/13 Restrictions Weight Bearing Restrictions Per Provider Order: No     Mobility  Bed Mobility Overal bed mobility: Needs Assistance Bed Mobility: Sit to Supine       Sit to supine: HOB elevated, Used rails, Contact guard assist   General bed mobility comments: Use of bed rail, CGA for safety    Transfers   Equipment used: Rolling walker (2 wheels) Transfers: Sit to/from Stand, Bed to chair/wheelchair/BSC Sit to Stand: Min assist   Step pivot transfers: Min assist, +2 safety/equipment       General transfer comment: minA for lift and lowering assist and cues for posture and breathing before pivoting to EOB.    Ambulation/Gait Ambulation/Gait assistance: Min Theme Park Manager Pattern/deviations: Step-to pattern, Trunk flexed, Shuffle       General Gait Details: PTA defer longer distance due to pt DOE 3/4 and c/o fatigue/weakness after standing BP checked at bedside; pivotal steps and sidesteps along EOB toward HOB.   Stairs Stairs:  (pt not able today)           Wheelchair Mobility     Tilt Bed    Modified Rankin (Stroke Patients Only)       Balance Overall balance assessment: Needs assistance Sitting-balance support: Feet supported Sitting balance-Leahy  Scale: Good Sitting balance - Comments: sits edge of recliner without support   Standing balance support: Reliant on assistive device for balance, During functional activity,  Bilateral upper extremity supported Standing balance-Leahy Scale: Poor Standing balance comment: Bil UE support on RW, CGA to minA for safety                            Communication Communication Communication: No apparent difficulties  Cognition Arousal: Alert Behavior During Therapy: WFL for tasks assessed/performed   PT - Cognitive impairments: Safety/Judgement, Attention                       PT - Cognition Comments: Internally distracted due to increased WOB with functional tasks. Pt with slow processing while standing, so PTA again checked orthostatic BP readings, pt found to be orthostatic, RN/MD notified. Following commands: Intact      Cueing Cueing Techniques: Verbal cues  Exercises Other Exercises Other Exercises: pt too winded/fatigued to perform this date    General Comments General comments (skin integrity, edema, etc.): 5L HF [place], SpO2 86% and above when good signal achieved, improved once pt returned to sitting EOB; see BP in comments above; unna boots in place with increased toe swelling and moist appearing skin observed when socks removed, RN notified of toe swelling/skin leaking fluid apparently      Pertinent Vitals/Pain Pain Assessment Pain Assessment: No/denies pain Pain Intervention(s): Monitored during session    Home Living                          Prior Function            PT Goals (current goals can now be found in the care plan section) Acute Rehab PT Goals Patient Stated Goal: Improve activity tolerance and not need oxygen PT Goal Formulation: With patient/family Time For Goal Achievement: 05/06/24 Potential to Achieve Goals: Fair Progress towards PT goals: Progressing toward goals (slowly)    Frequency    Min 2X/week      PT Plan      Co-evaluation              AM-PAC PT 6 Clicks Mobility   Outcome Measure  Help needed turning from your back to your side while in a flat bed without using  bedrails?: A Little Help needed moving from lying on your back to sitting on the side of a flat bed without using bedrails?: A Little Help needed moving to and from a bed to a chair (including a wheelchair)?: A Little Help needed standing up from a chair using your arms (e.g., wheelchair or bedside chair)?: A Little Help needed to walk in hospital room?: Total Help needed climbing 3-5 steps with a railing? : Total 6 Click Score: 14    End of Session Equipment Utilized During Treatment: Oxygen Activity Tolerance: Treatment limited secondary to medical complications (Comment);Patient limited by fatigue (Orthostatic hypotension) Patient left: in bed;with call bell/phone within reach;with bed alarm set (pt heels floated) Nurse Communication: Mobility status PT Visit Diagnosis: Other abnormalities of gait and mobility (R26.89);Difficulty in walking, not elsewhere classified (R26.2);Muscle weakness (generalized) (M62.81)     Time: 8346-8294 PT Time Calculation (min) (ACUTE ONLY): 12 min  Charges:    $Therapeutic Activity: 8-22 mins PT General Charges $$ ACUTE PT VISIT: 1 Visit  Connell SQUIBB., PTA Acute Rehabilitation Services Secure Chat Preferred 9a-5:30pm Office: [phone]    Connell HERO [facility] 04/24/2024, 5:57 PM

## 2024-04-25 DIAGNOSIS — J188 Other pneumonia, unspecified organism: Secondary | ICD-10-CM | POA: Diagnosis not present

## 2024-04-25 DIAGNOSIS — J9601 Acute respiratory failure with hypoxia: Secondary | ICD-10-CM | POA: Diagnosis not present

## 2024-04-25 DIAGNOSIS — D849 Immunodeficiency, unspecified: Secondary | ICD-10-CM | POA: Diagnosis not present

## 2024-04-25 DIAGNOSIS — Z85038 Personal history of other malignant neoplasm of large intestine: Secondary | ICD-10-CM | POA: Diagnosis not present

## 2024-04-25 LAB — COMPREHENSIVE METABOLIC PANEL WITH GFR
ALT: 101 U/L — ABNORMAL HIGH (ref 0–44)
AST: 93 U/L — ABNORMAL HIGH (ref 15–41)
Albumin: 2.4 g/dL — ABNORMAL LOW (ref 3.5–5.0)
Alkaline Phosphatase: 858 U/L — ABNORMAL HIGH (ref 38–126)
Anion gap: 15 (ref 5–15)
BUN: 28 mg/dL — ABNORMAL HIGH (ref 8–23)
CO2: 28 mmol/L (ref 22–32)
Calcium: 8.3 mg/dL — ABNORMAL LOW (ref 8.9–10.3)
Chloride: 89 mmol/L — ABNORMAL LOW (ref 98–111)
Creatinine, Ser: 1.24 mg/dL (ref 0.61–1.24)
GFR, Estimated: 58 mL/min — ABNORMAL LOW (ref >60)
Glucose, Bld: 97 mg/dL (ref 70–99)
Potassium: 3.8 mmol/L (ref 3.5–5.1)
Sodium: 132 mmol/L — ABNORMAL LOW (ref 135–145)
Total Bilirubin: 2.1 mg/dL — ABNORMAL HIGH (ref 0.0–1.2)
Total Protein: 5.1 g/dL — ABNORMAL LOW (ref 6.5–8.1)

## 2024-04-25 LAB — PROTIME-INR
INR: 1.5 — ABNORMAL HIGH (ref 0.8–1.2)
Prothrombin Time: 18.6 s — ABNORMAL HIGH (ref 11.4–15.2)

## 2024-04-25 LAB — CBC WITH DIFFERENTIAL/PLATELET
Abs Immature Granulocytes: 0.1 K/uL — ABNORMAL HIGH (ref 0.00–0.07)
Basophils Absolute: 0 K/uL (ref 0.0–0.1)
Basophils Relative: 0 %
Eosinophils Absolute: 0 K/uL (ref 0.0–0.5)
Eosinophils Relative: 0 %
HCT: 35.2 % — ABNORMAL LOW (ref 39.0–52.0)
Hemoglobin: 11.5 g/dL — ABNORMAL LOW (ref 13.0–17.0)
Immature Granulocytes: 1 %
Lymphocytes Relative: 2 %
Lymphs Abs: 0.2 K/uL — ABNORMAL LOW (ref 0.7–4.0)
MCH: 30.2 pg (ref 26.0–34.0)
MCHC: 32.7 g/dL (ref 30.0–36.0)
MCV: 92.4 fL (ref 80.0–100.0)
Monocytes Absolute: 0.8 K/uL (ref 0.1–1.0)
Monocytes Relative: 7 %
Neutro Abs: 10.7 K/uL — ABNORMAL HIGH (ref 1.7–7.7)
Neutrophils Relative %: 90 %
Platelets: 112 K/uL — ABNORMAL LOW (ref 150–400)
RBC: 3.81 MIL/uL — ABNORMAL LOW (ref 4.22–5.81)
RDW: 17.8 % — ABNORMAL HIGH (ref 11.5–15.5)
WBC: 11.8 K/uL — ABNORMAL HIGH (ref 4.0–10.5)
nRBC: 0 % (ref 0.0–0.2)

## 2024-04-25 LAB — GLUCOSE, CAPILLARY
Glucose-Capillary: 100 mg/dL — ABNORMAL HIGH (ref 70–99)
Glucose-Capillary: 161 mg/dL — ABNORMAL HIGH (ref 70–99)
Glucose-Capillary: 188 mg/dL — ABNORMAL HIGH (ref 70–99)
Glucose-Capillary: 187 mg/dL — ABNORMAL HIGH (ref 70–99)

## 2024-04-25 LAB — MAGNESIUM: Magnesium: 1.9 mg/dL (ref 1.7–2.4)

## 2024-04-25 MED ORDER — FUROSEMIDE 20 MG PO TABS
20.0000 mg | ORAL_TABLET | Freq: Every day | ORAL | Status: DC
  Administered 2024-04-25 – 2024-04-29 (×5): 20 mg via ORAL
  Filled 2024-04-25 (×5): qty 1

## 2024-04-25 MED ORDER — FUROSEMIDE 10 MG/ML IJ SOLN
20.0000 mg | Freq: Once | INTRAMUSCULAR | Status: AC
  Administered 2024-04-25: 20 mg via INTRAVENOUS
  Filled 2024-04-25: qty 2

## 2024-04-25 NOTE — Progress Notes (Signed)
 [facility] Progress Note Patient: Luis [name] FMW:[id number] DOB: March 22, 1941  DOA: 04/07/2024 DOS: the patient was seen and examined on 04/25/2024  Brief Hospital Course: Luis [name] is a 83 y.o. male with PMH of metastatic colon cancer, mets to lungs, liver, LN's, sarcoidosis, COPD, CAD hx PCI, HFpEF, hypertension, hyperlipidemia, hypothyroidism, who presented with progressive shortness of breath onset x 1 wk with exertional dyspnea, chest congestion, minimally productive cough with clear and scant blood-tinged sputum,  Currently being treated for pneumonia versus pneumonitis with acute hypoxic respiratory failure and acute on chronic HFpEF.  11/13: Mildly elevated blood pressure, still significant transaminitis-message sent to Dr. Clancy cardiologist regarding Multaq . Had 1 episode of bleeding while having bowel movement-history of hemorrhoid.  11/14: Hemodynamically stable, hemoglobin with some decreased 11.5, improving creatinine.  Becoming little more short of breath and hypoxic when stood for standing weight.  Giving 1 dose of IV Lasix  at 20 mg and restarting home Lasix . Pending formal cardiology consult regarding the use of Multaq .   Assessment and Plan: Acute respiratory failure with hypoxia  Sarcoidosis flareup with long-term steroid use. Acute on chronic diastolic CHF. Presents with shortness of breath CT chest:Multifocal GGO/mosaic attenuation  LL- suspect multifocal infection/pneumonia-? pneumonitis related to immunotherapy Reports being on steroid for last 2 weeks 20 mg of prednisone  here needing 4 L [place] ON Eliquis PTA-less likelihood of PE. TTE done- EF 70 to 75% G1 DD RV SF is normal RV size is normal. Blood culture NGTD Was on IV ceftriaxone  and oral doxycycline .  Now back on minocycline. Also on bronchodilators and steroids. S/P bronchoscopy BAL 10/31 - BAL culture-no growth to date AFB fungus PJP DFA- pending.  Cytology-atypical cells. Continue with  prednisone  taper. Ordered 1 dose of Lasix  20 mg and will restart home dose of Lasix  20 mg daily   Pneumomediastinum:  Will monitor closely.   Mild lactic acidosis due to respiratory distress   Acute on chronic HFpEF Chronic leg edema Hypokalemia: weight a week ago was 216 pound He has been intentionally losing weight before that he was 240.Weight at 219 pound on admission now 212>215> 205> 216>214>200>193 and has ongoing leg edema  -Restarting home Lasix  today   Elevated troponin: suspect demand ischemia with flat troponin. TTE is unremarkable  Bleeding per rectum.  Patient had 1 episode of bleeding with bowel movement yesterday, history of internal hemorrhoids stating that he sometimes bleeds. GI was initially consulted on admission and later signed off stating that it is either likely internal hemorrhoid or maybe some element of radiation proctitis. - Monitor hemoglobin -We will reconsult GI if bleeding persist   Hypokalemia Hypomagnesemia: Resolved   New onset type 2 diabetes with uncontrolled hyperglycemia: A1c at 7.1.  Initially cbg ~ 400-diet changed to carb controlled,started on glipizide and metformin ( will hold for now) cont ssi., Blood sugar fairly controlled.  Suspect in the setting of his steroid use as well   Metastatic colon cancer, R hemicolectomy, mets to lungs, liver, LN's: Followed by [facility], has known RUL nodule and opacities in the R base ? Lymphangitic spread v. Inflammation/infection (last in 6/'25). He is currently on maintenance 5FU and Panitumumab  every 2 weeks. Concern for immunotherapy mediated pneumonitis, patient on a steroid already as per pulmonary see above   Sarcoidosis: Follows with pulmonology, appears that he is currently on chronic steroids    pHTN: Suggested on imaging, noted    HTN HLD CAD hx PCI: Now with hypotension A-flutter. LFT elevation. Started on midodrine due  to hypotension. Holding statin due to elevated LFT. If  no improvement will have to stop dronedarone -due to persistently elevated LFTs, slight worsening of T. bili and small improvement in AST and ALT.INR elevated at 1.5 Pending formal cardiology consult regarding the use of Multaq .   Aflutter: Rate controlled. Cont home Apixaban, Dronedarone     Aortic ectasia: OP surveillance    Hypothyroidism: Continue home levothyroxine     Chronic anemia:  Stable at baseline.B12 and folate normal.   Thrombocytopenia: Monitor platelet count, today at 112   Chronic pain: Rosacea: continue home Minocycline-after completing antibiotics, continue gabapentin .    Obesity Class 1 Body mass index is 29.24 kg/m.  Placing the pt at higher risk of poor outcomes.   Subjective: Patient was complaining of worsening shortness of breath and had significant worsening of hypoxia with minor exertion.  Physical Exam: General.  Frail elderly man, in no acute distress. Pulmonary.  Basal crackles bilaterally, normal respiratory effort. CV.  Regular rate and rhythm, no JVD, rub or murmur. Abdomen.  Soft, nontender, nondistended, BS positive. CNS.  Alert and oriented .  No focal neurologic deficit. Extremities.  1+ LE edema,  pulses intact and symmetrical. Psychiatry.  Judgment and insight appears normal.   Data Reviewed: I have Reviewed nursing notes, Vitals, and Lab results. Since last encounter, pertinent lab results CBC and BMP   . I have ordered test including CBC and BMP  .   Disposition: Status is: Inpatient Remains inpatient appropriate because: Monitor for improvement in volume status  Place TED hose Start: 04/19/24 0741 apixaban (ELIQUIS) tablet 5 mg    Family Communication: Discussed with wife at bedside  Level of care: Progressive   Vitals:   04/25/24 0738 04/25/24 0742 04/25/24 1113 04/25/24 1131  BP:  (!) 141/76 133/69   Pulse:  70 79   Resp:  (!) 25 (!) 23   Temp:  (!) 97.4 F (36.3 C) 97.7 F (36.5 C)   TempSrc:  Oral Oral   SpO2: 95%  95% 97% 98%  Weight:      Height:       This record has been created using Conservation officer, historic buildings. Errors have been sought and corrected,but may not always be located. Such creation errors do not reflect on the standard of care.   Author: Amaryllis Dare, MD 04/25/2024 12:46 PM  Please look on [email or web address] to find out who is on call.

## 2024-04-25 NOTE — Progress Notes (Signed)
 OT Cancellation Note  Patient Details Name: Luis [name] MRN: [id number] DOB: 1940-11-30   Cancelled Treatment:    Reason Eval/Treat Not Completed: Medical issues which prohibited therapy.  Rn requests hold on skilled OT treatment today.  pt. With poor o2 reserve and is unable to maintain o2 levels at this time with any physical movement/positional changes.  Our team will check back next week and attempt skilled OT as pt. Able.    CHRISTELLA Nest Lorraine-COTA/L  04/25/2024, 8:58 AM

## 2024-04-25 NOTE — Progress Notes (Signed)
 NT had patient stand for standing weight at which time patient became very short of breath and O2 sats dropped to the 70's on 4 L HFNC.  Patient c/o dizziness. Patient returned to bed and continued with SOB for several minutes.  O2 bumped to 7L.  In about 5 minutes patient's O2 returned to upper 80's-low 90's and [place] was placed at 5L with patient maintaining O2 around 90.  Patient continued to appear mildly SOB at rest. Bed weight was taken as standing weight was not possible this am.

## 2024-04-25 NOTE — Progress Notes (Signed)
 PT Cancellation Note  Patient Details Name: Luis [name] MRN: [id number] DOB: 16-Feb-1941   Cancelled Treatment:    Reason Eval/Treat Not Completed: Medical issues which prohibited therapy (Rn requests hold on skilled OT treatment today.  pt. With poor o2 reserve and is unable to maintain o2 levels at this time with any physical movement/positional changes.)   Stephane JULIANNA Bevel 04/25/2024, 9:07 AM Joshalyn Ancheta M,PT Acute Rehab Services [phone]

## 2024-04-25 NOTE — Consult Note (Addendum)
 Cardiology Consultation   Patient ID: Luis [name] MRN: [id number]; DOB: [birth year]  Admit date: 04/07/2024 Date of Consult: 04/25/2024  PCP:  Luis Charlene CROME, MD   [facility] Providers Cardiologist:  Luis [name], MD  Cardiology APP:  Luis Glendia DASEN, PA-C       Patient Profile: Luis [name] is a 83 y.o. male with a hx of metastatic colon cancer with metasteses to lungs, liver, sarcoidosis, COPD, CAD s/p PCI, HFpEF, aflutter s/p cardioversion, hypertension, HLD, hypothyroidism that presented with progressive worsening of SOB for a week with chest congestion and blood tinged sputum production.  They are  being seen 04/25/2024 for the evaluation of rising LFTs in the setting of medication Multaq   at the request of Dr. Caleen.  History of Present Illness: Luis [name] is an 83 year old male with PMH of metastatic colon cancer with metasteses to lungs, liver, sarcoidosis, COPD, CAD s/p PCI, HFpEF, hypertension, HLD, hypothyroidism that presented with progressive worsening of SOB for a week with chest congestion and blood tinged sputum production. He is being seen today for evaluation of elevated LFTs possible due to medication Multaq .   Patient states that he had been having worsening shortness of breath for months but was getting worse acutely within the last week or so before she came in to the hospital. He states that he was also having some lightheadedness and dizziness he states while he has been standing. He otherwise denies any chest pain except some pleuritic pain when coughing, palpitations, PND or orthopnea. Patient states that he was not having any lower extremity edema until he came in here to the hospital and it has been worsening since. He said he would rarely take lasix  when he was at home as it is PRN. Patient states he is not on home oxygen at baseline. He is unsure if he has having any more hemoptysis but do see some blood tinged sputum in suction tubing.   Patient  denies any abdominal pain. He states that he is unsure of  any abnormal lab work that is to do with his liver. He states that in about 2022 he was getting treatment at [facility] with a chemotherapy pump that directly gave chemotherapy to his liver. Afterwards he had resection of his liver mass and states that things have been stable since then. He does get chemotherapy he states every 2 weeks and other than nausea or vomiting he has tolerated the chemotherapy well.   Of note patient is also s/p cardioversion at the end of August. He states that he has been on multaq  for about two to three months. He denies any palpitations. He does state that when he did have cardioversion he felt like he noticed a positive difference.     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 08/2014 Cath/PCI: LM nl, LAD 30p, D1 95 (2.25x12 Resolute Integrity DES), LCX small, RI 100 (attempted PCI) - branches fill via L->L collats, RCA dominant, nl, RPDA/PLA nl, EF 60^. b. 02/24/2015 PCI CTO of Ramus DES x2.   Chronic bronchitis (HCC)    hx   Diverticulosis 05/2005   Elevated lipase    Fuchs' corneal dystrophy    History of  adenomatous polyp of colon 05/2005   8 mm adenoma   History of blood transfusion    related to some of my surgeries   Hyperlipidemia    Hypertension    Iron deficiency anemia due to chronic blood loss 06/21/2017   Pneumonia    Prostate cancer (HCC) 2011   S/P seed implant   Sarcoidosis    Thrombocytopenia    a. Noted on prior labs, unclear of what w/u done.   Trifascicular block     Past Surgical History:  Procedure Laterality Date   BASAL CELL CARCINOMA EXCISION Left    shoulder   CARDIAC CATHETERIZATION N/A 02/24/2015    Procedure: Coronary/Bypass Graft CTO Intervention;  Surgeon: Candyce GORMAN Reek, MD;  Location: [facility] INVASIVE CV LAB;  Service: Cardiovascular;  Laterality: N/A;   CARDIAC CATHETERIZATION  02/24/2015   Procedure: Coronary/Graft Atherectomy;  Surgeon: Candyce GORMAN Reek, MD;  Location: [facility] INVASIVE CV LAB;  Service: Cardiovascular;;   CARDIOVERSION N/A 02/07/2024   Procedure: CARDIOVERSION;  Surgeon: Raford Riggs, MD;  Location: [facility] INVASIVE CV LAB;  Service: Cardiovascular;  Laterality: N/A;   CATARACT EXTRACTION W/ INTRAOCULAR LENS  IMPLANT, BILATERAL Bilateral ~ 2011-2012   COLONOSCOPY W/ POLYPECTOMY  06/08/2005 and 10/18/2010   8 mm adenoma 2006, none 2012. Diverticulosis and internal hemorrhoids.   CORNEAL TRANSPLANT Bilateral ~ 2011-2012   @ same time as cataract OR   CORONARY ANGIOPLASTY WITH STENT PLACEMENT  08/2014; 02/24/2015   1 stent + 1 stent   CORONARY STENT INTERVENTION N/A 01/02/2019   Procedure: CORONARY STENT INTERVENTION;  Surgeon: Reek Candyce GORMAN, MD;  Location: [facility] INVASIVE CV LAB;  Service: Cardiovascular;  Laterality: N/A;   EYE SURGERY     FINGER SURGERY Right 2014   reattached middle finger   INSERTION PROSTATE RADIATION SEED  ~ 2011   IR IMAGING GUIDED PORT INSERTION  03/04/2019   IR RADIOLOGIST EVAL & MGMT  05/26/2020   IR US  GUIDE BX ASP/DRAIN  03/04/2019   LAPAROSCOPIC CHOLECYSTECTOMY  2015   LAPAROSCOPIC PARTIAL COLECTOMY N/A 07/26/2017   Procedure: LAPAROSCOPIC PARTIAL COLECTOMY, EXCISION SCROTAL CYST;  Surgeon: Tanda Locus, MD;  Location: [facility] ORS;  Service: General;  Laterality: N/A;   LEFT HEART CATH AND CORONARY ANGIOGRAPHY N/A 01/02/2019   Procedure: LEFT HEART CATH AND CORONARY ANGIOGRAPHY;  Surgeon: Reek Candyce GORMAN, MD;  Location: [facility] INVASIVE CV LAB;  Service: Cardiovascular;  Laterality: N/A;   LEFT HEART CATHETERIZATION WITH CORONARY ANGIOGRAM N/A 08/12/2014   Procedure: LEFT HEART CATHETERIZATION WITH CORONARY ANGIOGRAM;  Surgeon: Ozell JONETTA Fell, MD;  Location: [facility] CATH LAB;  Service: Cardiovascular;  Laterality: N/A;   LYMPH NODE BIOPSY     mid chest   MOHS SURGERY Right ~ 2011   eyelid; for basal cell   RADIOLOGY WITH ANESTHESIA N/A 06/30/2020   Procedure: IR WITH ANESTHESIA MICROWAVE ABLATION;  Surgeon: Alona Corners, DO;  Location: [facility] ORS;  Service: Anesthesiology;  Laterality: N/A;   RIGHT/LEFT HEART CATH AND CORONARY ANGIOGRAPHY N/A 04/25/2022   Procedure: RIGHT/LEFT HEART CATH AND CORONARY ANGIOGRAPHY;  Surgeon: Reek Candyce GORMAN, MD;  Location: [facility] INVASIVE CV LAB;  Service: Cardiovascular;  Laterality: N/A;   THORACENTESIS N/A 02/11/2021   Procedure: MILANA;  Surgeon: Annella Donnice SAUNDERS, MD;  Location: [facility] ENDOSCOPY;  Service: Pulmonary;  Laterality: N/A;   THORACENTESIS Right 04/04/2021   Procedure: THORACENTESIS;  Surgeon: Claudene Toribio BROCKS, MD;  Location: [facility] ENDOSCOPY;  Service: Pulmonary;  Laterality: Right;   THORACENTESIS Right 08/16/2021   Procedure: THORACENTESIS;  Surgeon: Harold Scholz, MD;  Location: [facility] ENDOSCOPY;  Service: Pulmonary;  Laterality: Right;   THORACENTESIS Right 11/29/2021   Procedure: THORACENTESIS;  Surgeon: Kara Dorn NOVAK, MD;  Location: [facility] ENDOSCOPY;  Service: Pulmonary;  Laterality: Right;   THORACENTESIS N/A 03/17/2022   Procedure: MILANA;  Surgeon: Annella Donnice SAUNDERS, MD;  Location: [facility] ENDOSCOPY;  Service: Pulmonary;  Laterality: N/A;   THORACENTESIS N/A 07/14/2022   Procedure: MILANA;  Surgeon: Brenna Adine CROME, DO;  Location: [facility] ENDOSCOPY;  Service: Pulmonary;  Laterality: N/A;   THORACENTESIS N/A 10/11/2022   Procedure: MILANA;  Surgeon: Harold Scholz, MD;  Location: [facility] ENDOSCOPY;  Service: Pulmonary;  Laterality: N/A;   THOROCOTOMY WITH LOBECTOMY Right ~ 1991   partial removal right lung   TONSILLECTOMY  ~ 1950   VIDEO BRONCHOSCOPY N/A 04/11/2024   Procedure: VIDEO BRONCHOSCOPY WITHOUT FLUORO;  Surgeon: Gretta Leita SQUIBB, DO;  Location: [facility] ENDOSCOPY;   Service: Endoscopy;  Laterality: N/A;       Scheduled Meds:  apixaban  5 mg Oral BID   benzonatate  100 mg Oral TID   budesonide -glycopyrrolate-formoterol  2 puff Inhalation BID   Chlorhexidine  Gluconate Cloth  6 each Topical Daily   dextromethorphan   30 mg Oral BID   docusate sodium   100 mg Oral BID   dronedarone   400 mg Oral BID WC   furosemide   20 mg Oral Daily   gabapentin   200 mg Oral QHS   guaiFENesin   600 mg Oral BID   insulin aspart  0-15 Units Subcutaneous TID WC   ipratropium-albuterol   3 mL Nebulization QID   levothyroxine   50 mcg Oral Q0600   midodrine  10 mg Oral TID WC   minocycline  100 mg Oral Daily   montelukast   10 mg Oral QHS   pantoprazole  40 mg Oral BID   predniSONE   20 mg Oral Q breakfast   Followed by   NOREEN ON 04/29/2024] predniSONE   15 mg Oral Q breakfast   sodium chloride  flush  10-40 mL Intracatheter Q12H   sucralfate  1 g Oral BID   Continuous Infusions:  PRN Meds: acetaminophen , albuterol , LORazepam , melatonin, ondansetron  (ZOFRAN ) IV, mouth rinse, phenol, polyethylene glycol, sodium chloride , sodium chloride  flush  Allergies:    Allergies  Allergen Reactions   Losartan  Potassium Other (See Comments)    Hyperkalemia    Social History:   Social History   Socioeconomic History   Marital status: Married    Spouse name: Not on file   Number of children: 2   Years of education: Not on file   Highest education level: Not on file  Occupational History   Occupation: Unemployed  Tobacco Use   Smoking status: Former    Types: Cigars   Smokeless tobacco: Never   Tobacco comments:    Has stopped smoking and is no longer smoking cigars as of 2024.   Vaping Use   Vaping status: Never Used  Substance and Sexual Activity   Alcohol use: Yes    Alcohol/week: 2.0 standard drinks of alcohol    Types: 1 Glasses of wine, 1 Standard drinks or equivalent per week    Comment: Drinks maybe 1-2 drinks a week or less   Drug use: No   Sexual  activity: Not on file  Other Topics Concern   Not on file  Social History Narrative   Lives at home wife.     Social Drivers of Corporate Investment Banker Strain: Not on file  Food Insecurity: No  Food Insecurity (04/07/2024)   Hunger Vital Sign    Worried About Running Out of Food in the Last Year: Never true    Ran Out of Food in the Last Year: Never true  Transportation Needs: No Transportation Needs (04/07/2024)   PRAPARE - Administrator, Civil Service (Medical): No    Lack of Transportation (Non-Medical): No  Physical Activity: Not on file  Stress: Not on file  Social Connections: Socially Integrated (04/07/2024)   Social Connection and Isolation Panel    Frequency of Communication with Friends and Family: More than three times a week    Frequency of Social Gatherings with Friends and Family: Three times a week    Attends Religious Services: More than 4 times per year    Active Member of Clubs or Organizations: Yes    Attends Banker Meetings: More than 4 times per year    Marital Status: Married  Catering Manager Violence: Not At Risk (04/07/2024)   Humiliation, Afraid, Rape, and Kick questionnaire    Fear of Current or Ex-Partner: No    Emotionally Abused: No    Physically Abused: No    Sexually Abused: No    Family History:    Family History  Problem Relation Age of Onset   Heart disease Father 47       Died from hardening of the arteries age 41   Coronary artery disease Sister 1   Heart attack Sister    Colon cancer Neg Hx    Throat cancer Neg Hx    Pancreatic cancer Neg Hx    Prostate cancer Neg Hx    Stroke Neg Hx    Hypertension Neg Hx      ROS:  Please see the history of present illness.   All other ROS reviewed and negative.     Physical Exam/Data: Vitals:   04/25/24 0738 04/25/24 0742 04/25/24 1113 04/25/24 1131  BP:  (!) 141/76 133/69   Pulse:  70 79   Resp:  (!) 25 (!) 23   Temp:  (!) 97.4 F (36.3 C) 97.7 F  (36.5 C)   TempSrc:  Oral Oral   SpO2: 95% 95% 97% 98%  Weight:      Height:        Intake/Output Summary (Last 24 hours) at 04/25/2024 1201 Last data filed at 04/25/2024 0547 Gross per 24 hour  Intake --  Output 1450 ml  Net -1450 ml      04/25/2024    7:00 AM 04/24/2024    5:00 AM 04/23/2024    3:40 AM  Last 3 Weights  Weight (lbs) 193 lb 12.6 oz 200 lb 13.4 oz 201 lb 11.5 oz  Weight (kg) 87.9 kg 91.1 kg 91.5 kg     Body mass index is 27.03 kg/m.  General: chronically ill appearing male in no acute distress HEENT: normal Neck: JVD appreciated 1/3 above clavicle on the left side  Vascular: No carotid bruits; Distal pulses 2+ bilaterally Cardiac:  normal S1, S2; RRR; no murmur  Lungs:  no respiratory distress, bilateral crackles in all lung fields, on 4L of oxygen.  Abd: soft, nontender, no hepatomegaly  Ext: Unna boots on bilaterally, 1+ pitting edema below the knees bilaterally  Skin: warm and dry  Neuro:  CNs 2-12 intact, no focal abnormalities noted Psych:  Normal affect   EKG:  The EKG was personally reviewed and demonstrates:  sinus rhythm with premature atrial contractions.  Telemetry:  Telemetry was personally  reviewed and demonstrates:  sinus rhythm with first degree heart block   Relevant CV Studies: ECHO on 10/28 showed LV EF of 70 to 75 % with LV having hyperdynamic function, no regional wall motion abnormalities. Grade 1 diastolic dysfunction. IVC normal in size. Previous EF in march was 60 to 65% Carotid US  shows total occlusion of right ICA with left carotid showing velocities consistent with 1-39% stenosis.  Vascular Lower extremity US  showing age indeterminate deep vein thrombosis in the right popliteal vein, right posterior tibial veins  RHC/LHC in 2023 showed mean PCWP , cardiac output of 8.4 L/min. EF of 55-65%. Patent stents in the LAD and diagonal and widely patent prior diagonal angioplasty. Ramus stent widely patent. Apical LAD with 70%  lesion.   Laboratory Data: High Sensitivity Troponin:   Recent Labs  Lab 04/07/24 0036 04/07/24 0230  TROPONINIHS 29* 33*     Chemistry Recent Labs  Lab 04/23/24 0350 04/24/24 0420 04/25/24 0316  NA 136 130* 132*  K 4.0 4.6 3.8  CL 92* 89* 89*  CO2 29 28 28   GLUCOSE 99 135* 97  BUN 25* 30* 28*  CREATININE 1.25* 1.47* 1.24  CALCIUM  8.2* 8.6* 8.3*  MG 2.1 1.9 1.9  GFRNONAA 57* 47* 58*  ANIONGAP 15 13 15     Recent Labs  Lab 04/23/24 0350 04/24/24 0420 04/25/24 0316  PROT 5.4* 5.5* 5.1*  ALBUMIN 2.6* 2.8* 2.4*  AST 150* 123* 93*  ALT 136* 122* 101*  ALKPHOS 879* 887* 858*  BILITOT 1.6* 1.8* 2.1*   Lipids No results for input(s): CHOL, TRIG, HDL, LABVLDL, LDLCALC, CHOLHDL in the last 168 hours.  Hematology Recent Labs  Lab 04/23/24 0350 04/24/24 0420 04/25/24 0316  WBC 14.9* 14.2* 11.8*  RBC 4.14* 4.16* 3.81*  HGB 12.5* 12.5* 11.5*  HCT 38.9* 38.9* 35.2*  MCV 94.0 93.5 92.4  MCH 30.2 30.0 30.2  MCHC 32.1 32.1 32.7  RDW 17.6* 17.6* 17.8*  PLT 123* 144* 112*   Thyroid No results for input(s): TSH, FREET4 in the last 168 hours.  BNPNo results for input(s): BNP, PROBNP in the last 168 hours.  DDimer No results for input(s): DDIMER in the last 168 hours.  Radiology/Studies:  DG CHEST PORT 1 VIEW Result Date: 04/22/2024 CLINICAL DATA:  Shortness of breath. Acute hypoxic respiratory failure. EXAM: PORTABLE CHEST 1 VIEW COMPARISON:  Radiographs 04/21/2024 and 04/20/2024.  CT 04/20/2024. FINDINGS: 0026 hours. Right IJ Port-A-Cath projects over the mid SVC. The heart size and mediastinal contours are stable with cardiomegaly and calcified mediastinal lymph nodes. There is chronic lung disease with postsurgical changes and chronic parenchymal opacity at the right lung base. The superimposed left basilar airspace disease has not significantly changed from the recent prior studies. No pneumothorax or enlarging pleural effusion. Subcutaneous  emphysema in the lower left neck has improved in the interval. No acute osseous findings are seen. IMPRESSION: 1. No significant change in left basilar airspace disease superimposed on chronic lung disease. 2. Improved subcutaneous emphysema in the lower left neck. No pneumothorax. Electronically Signed   By: Elsie Perone M.D.   On: 04/22/2024 12:28     Assessment and Plan: Acute on Chronic Heart failure with preserved ejection fraction Patient has had increased leg swelling bilaterally since hospitalization has started. Was taking lasix  prn 20 mg but states that he was not taking this much before this hospitalization. Given 1 dose of IV lasix  20 today and restarted on home lasix  20 mg . Fluid build up could also  likely be due to low albumin but on physical exam does seem to be hypervolemic - can continue lasix  20 mg daily and continue to  assess volume status   2. Elevated LFTS For multaq  to cause liver injury, LFTs should be 3x upper limit of normal which is consistent with labs on 11/11-11/12. Home statin had been held due to elevated LFT. LFTs are trending down now at 93 and 101. Unsure if this is the cause of elevated LFTs but looking at rhythm, believe that we can discontinue multaq .  - continue to monitor LFTs and assess rhythm daily.  - If patient goes back into aflutter could consider cardioversion with him being on AC  3. Aflutter s/p cardioversion on [facility] Patient was cardioverted on 8/28 this year. Was started on multaq  about 3 months ago. LFTS at one point during this hospitalization were about 3x the limit of normal which could suggest that this was caused by multaq . Will discontinue multaq  at this time.  - d/c multaq  - Continue apixaban 5mg  BID, seems that GI thinks blood could be due internal hemorrhoids  - could cardiovert with patient being on [facility] if goes back into aflutter. Not great antiarrhythmic options with patient's medical history  - Will also get EKG to assess rhythm  better    3. DVT on AC  Noted in the right leg in the popliteal and posterior tibial veins.  Continue on AC  4. CAD s/p PCI HLD Statin being held due to elevated LFT. Management per primary       Risk Assessment/Risk Scores:       New [place]  Heart Association (NYHA) Functional Class NYHA Class I      For questions or updates, please contact [facility] Please consult [email or web address] for contact info under      Signed, Rosalyn D'Mello, DO  04/25/2024 12:01 PM   I have personally evaluated and examined the patient. The history, physical exam, and medical decision making documented below were performed independently and substantively by me. I have reviewed all relevant data, formulated the assessment and plan, and assumed responsibility for the management of this patient. My documentation reflects the substantive portion of the split/shared visit, in accordance with CMS and CPT guidelines. Reviewed care plan with Dr. Kem prior to completion of this note.  I have personally performed the substantive portion of the medical decision making, including interpretation of diagnostic data, formulation of the management plan, and assessment of risks. In summation:  Mr. Drummonds with heart failure with preserved ejection fraction and paroxysmal atrial fibrillation, presents with shortness of breath and evaluation of elevated liver function tests while on Multaq .  He has been experiencing progressively worsening shortness of breath over the past week, which led to his hospital visit. He has a history of heart failure with preserved ejection fraction (HFpEF) and reports an acute exacerbation of symptoms, including worsening lower extremity edema since his hospital admission. He is on Lasix  20 mg at home, which he rarely takes as it is prescribed on a PRN basis. Due to the worsening edema, he was started on IV Lasix  20 mg today and has resumed his home dose.  He has a history of  paroxysmal atrial fibrillation and underwent cardioversion on August 28th. Since then, no palpitations or awareness of arrhythmias. An EKG performed on October 27th showed sinus rhythm with a right bundle branch block and no ischemic changes.  He is currently on the antiarrhythmic medication Multaq , which he started 2-3 months ago,  and has mildly elevated liver function tests (LFTs). He is also on a statin. He has a history of metastatic disease with metastasis to the lungs. He previously had a liver resection and chemotherapy pump for liver metastasis.   Exam notable for  GENERAL: No acute distress. NECK: No jugular venous distention. CARDIAC: RRR, No murmur on my exam. ABDOMEN: No abdominal tenderness. EXTREMITIES: Lower extremity edema.  Personally reviewed data and interpretation:  LABS LFTs: mildly elevated Sodium: 132 Albumin: 2.4  RADIOLOGY CT scan: multivessel coronary artery disease, aortic valve calcification, lung metastasis, no liver metastasis  DIAGNOSTIC EKG: sinus rhythm, right bundle branch block, normal QTc, no ischemic changes, small septal infarct pattern (04/07/2024) Tele: SR with frequent PACs  In assessment and plan:    Paroxysmal atrial fibrillation, status post cardioversion, on antiarrhythmic therapy (dronedarone ) - Status post cardioversion on August 28th, currently in sinus rhythm. On dronededarone (Multaq ) for rhythm control. Concerns about potential medication-induced transaminitis. Risks of discontinuing Multaq  include potential return to atrial fibrillation or flutter. Benefits include potential resolution of liver enzyme elevation. - Will consider discontinuing Multaq  if in stable sinus rhythm to assess impact on liver enzymes - EKG ordered as above  Transaminitis (mildly elevated liver enzymes) possibly medication-induced Mildly elevated liver enzymes, possibly related to dronededarone. Liver metastasis present, but no liver metastasis on recent  imaging. Differential includes medication-induced liver injury versus liver metastasis. - Will consider discontinuing Multaq  to assess impact on liver enzymes  Heart failure with preserved ejection fraction (HFpEF) with acute exacerbation Acute exacerbation of HFpEF with worsening lower extremity edema. Unclear if due to volume retention from heart failure or metastatic lung cancer. - Continue IV Lasix    Metastatic cancer to liver and lungs, status post liver resection and chemotherapy Metastatic cancer with liver and lung involvement. Status post liver resection and chemotherapy. LFTs are always slightly elevated.   Stanly Leavens, MD FASE [facility] Cardiologist [facility]  [address] [place], [address] [place], [name] [address] [phone]  4:34 PM

## 2024-04-26 DIAGNOSIS — J9601 Acute respiratory failure with hypoxia: Secondary | ICD-10-CM | POA: Diagnosis not present

## 2024-04-26 DIAGNOSIS — Z85038 Personal history of other malignant neoplasm of large intestine: Secondary | ICD-10-CM | POA: Diagnosis not present

## 2024-04-26 DIAGNOSIS — J188 Other pneumonia, unspecified organism: Secondary | ICD-10-CM | POA: Diagnosis not present

## 2024-04-26 DIAGNOSIS — D849 Immunodeficiency, unspecified: Secondary | ICD-10-CM | POA: Diagnosis not present

## 2024-04-26 LAB — CBC
HCT: 34 % — ABNORMAL LOW (ref 39.0–52.0)
Hemoglobin: 11 g/dL — ABNORMAL LOW (ref 13.0–17.0)
MCH: 30.2 pg (ref 26.0–34.0)
MCHC: 32.4 g/dL (ref 30.0–36.0)
MCV: 93.4 fL (ref 80.0–100.0)
Platelets: 97 K/uL — ABNORMAL LOW (ref 150–400)
RBC: 3.64 MIL/uL — ABNORMAL LOW (ref 4.22–5.81)
RDW: 18 % — ABNORMAL HIGH (ref 11.5–15.5)
WBC: 12.3 K/uL — ABNORMAL HIGH (ref 4.0–10.5)
nRBC: 0 % (ref 0.0–0.2)

## 2024-04-26 LAB — COMPREHENSIVE METABOLIC PANEL WITH GFR
ALT: 91 U/L — ABNORMAL HIGH (ref 0–44)
AST: 93 U/L — ABNORMAL HIGH (ref 15–41)
Albumin: 2.3 g/dL — ABNORMAL LOW (ref 3.5–5.0)
Alkaline Phosphatase: 801 U/L — ABNORMAL HIGH (ref 38–126)
Anion gap: 13 (ref 5–15)
BUN: 20 mg/dL (ref 8–23)
CO2: 29 mmol/L (ref 22–32)
Calcium: 8.1 mg/dL — ABNORMAL LOW (ref 8.9–10.3)
Chloride: 90 mmol/L — ABNORMAL LOW (ref 98–111)
Creatinine, Ser: 1.13 mg/dL (ref 0.61–1.24)
GFR, Estimated: >60 mL/min (ref >60)
Glucose, Bld: 150 mg/dL — ABNORMAL HIGH (ref 70–99)
Potassium: 3.3 mmol/L — ABNORMAL LOW (ref 3.5–5.1)
Sodium: 132 mmol/L — ABNORMAL LOW (ref 135–145)
Total Bilirubin: 1.7 mg/dL — ABNORMAL HIGH (ref 0.0–1.2)
Total Protein: 4.8 g/dL — ABNORMAL LOW (ref 6.5–8.1)

## 2024-04-26 LAB — GLUCOSE, CAPILLARY
Glucose-Capillary: 118 mg/dL — ABNORMAL HIGH (ref 70–99)
Glucose-Capillary: 209 mg/dL — ABNORMAL HIGH (ref 70–99)
Glucose-Capillary: 188 mg/dL — ABNORMAL HIGH (ref 70–99)
Glucose-Capillary: 90 mg/dL (ref 70–99)

## 2024-04-26 MED ORDER — IPRATROPIUM-ALBUTEROL 0.5-2.5 (3) MG/3ML IN SOLN
3.0000 mL | Freq: Four times a day (QID) | RESPIRATORY_TRACT | Status: DC | PRN
  Administered 2024-04-27 – 2024-05-09 (×5): 3 mL via RESPIRATORY_TRACT
  Filled 2024-04-26 (×5): qty 3

## 2024-04-26 MED ORDER — POTASSIUM CHLORIDE CRYS ER 20 MEQ PO TBCR
40.0000 meq | EXTENDED_RELEASE_TABLET | Freq: Once | ORAL | Status: AC
  Administered 2024-04-26: 40 meq via ORAL
  Filled 2024-04-26: qty 2

## 2024-04-26 MED ORDER — ALBUTEROL SULFATE (2.5 MG/3ML) 0.083% IN NEBU
2.5000 mg | INHALATION_SOLUTION | RESPIRATORY_TRACT | Status: DC | PRN
Start: 2024-04-26 — End: 2024-04-26

## 2024-04-26 NOTE — Plan of Care (Signed)
  Problem: Education: Goal: Ability to describe self-care measures that may prevent or decrease complications (Diabetes Survival Skills Education) will improve Outcome: Progressing   Problem: Fluid Volume: Goal: Ability to maintain a balanced intake and output will improve Outcome: Progressing   Problem: Nutritional: Goal: Maintenance of adequate nutrition will improve Outcome: Progressing   Problem: Skin Integrity: Goal: Risk for impaired skin integrity will decrease Outcome: Progressing   Problem: Clinical Measurements: Goal: Respiratory complications will improve Outcome: Progressing Note: Pt noted decreased SOB with exertion when he got up to the [facility] today   Problem: Activity: Goal: Risk for activity intolerance will decrease Outcome: Progressing   Problem: Coping: Goal: Level of anxiety will decrease Outcome: Progressing

## 2024-04-26 NOTE — Progress Notes (Signed)
 [facility] Progress Note Patient: Luis [name] FMW:[id number] DOB: [birth year]  DOA: 04/07/2024 DOS: the patient was seen and examined on 04/26/2024  Brief Hospital Course: SLADE PIERPOINT is a 83 y.o. male with PMH of metastatic colon cancer, mets to lungs, liver, LN's, sarcoidosis, COPD, CAD hx PCI, HFpEF, hypertension, hyperlipidemia, hypothyroidism, who presented with progressive shortness of breath onset x 1 wk with exertional dyspnea, chest congestion, minimally productive cough with clear and scant blood-tinged sputum,  Currently being treated for pneumonia versus pneumonitis with acute hypoxic respiratory failure and acute on chronic HFpEF.  11/13: Mildly elevated blood pressure, still significant transaminitis-message sent to Dr. Clancy cardiologist regarding Multaq . Had 1 episode of bleeding while having bowel movement-history of hemorrhoid.  11/14: Hemodynamically stable, hemoglobin with some decreased 11.5, improving creatinine.  Becoming little more short of breath and hypoxic when stood for standing weight.  Giving 1 dose of IV Lasix  at 20 mg and restarting home Lasix . Pending formal cardiology consult regarding the use of Multaq .  11/15: Blood pressure started trending up so holding midodrine and placed parameters.  Multaq  was discontinued by cardiology and if he goes back in a flutter, he will need DCCV.   Assessment and Plan: Acute respiratory failure with hypoxia  Sarcoidosis flareup with long-term steroid use. Acute on chronic diastolic CHF. Presents with shortness of breath CT chest:Multifocal GGO/mosaic attenuation  LL- suspect multifocal infection/pneumonia-? pneumonitis related to immunotherapy Reports being on steroid for last 2 weeks 20 mg of prednisone  here needing 4 L [place] ON Eliquis PTA-less likelihood of PE. TTE done- EF 70 to 75% G1 DD RV SF is normal RV size is normal. Blood culture NGTD Was on IV ceftriaxone  and oral doxycycline .  Now back on  minocycline. Also on bronchodilators and steroids. S/P bronchoscopy BAL 10/31 - BAL culture-no growth to date AFB fungus PJP DFA- pending.  Cytology-atypical cells. Continue with prednisone  taper. Received IV Lasix  and now on p.o. Lasix  of 20 mg daily.   Pneumomediastinum:  Will monitor closely.   Mild lactic acidosis due to respiratory distress   Acute on chronic HFpEF Chronic leg edema Hypokalemia: weight a week ago was 216 pound He has been intentionally losing weight before that he was 240.Weight at 219 pound on admission now 212>215> 205> 216>214>200>193 and has ongoing leg edema  - Now on home Lasix  of 20 mg daily   Elevated troponin: suspect demand ischemia with flat troponin. TTE is unremarkable  Bleeding per rectum.  Patient had 1 episode of bleeding with bowel movement yesterday, history of internal hemorrhoids stating that he sometimes bleeds. GI was initially consulted on admission and later signed off stating that it is either likely internal hemorrhoid or maybe some element of radiation proctitis. - Monitor hemoglobin -We will reconsult GI if bleeding persist   Hypokalemia Hypomagnesemia: Resolved   New onset type 2 diabetes with uncontrolled hyperglycemia: A1c at 7.1.  Initially cbg ~ 400-diet changed to carb controlled,started on glipizide and metformin ( will hold for now) cont ssi., Blood sugar fairly controlled.  Suspect in the setting of his steroid use as well   Metastatic colon cancer, R hemicolectomy, mets to lungs, liver, LN's: Followed by [facility], has known RUL nodule and opacities in the R base ? Lymphangitic spread v. Inflammation/infection (last in 6/'25). He is currently on maintenance 5FU and Panitumumab  every 2 weeks. Concern for immunotherapy mediated pneumonitis, patient on a steroid already as per pulmonary see above   Sarcoidosis: Follows with pulmonology, appears that he  is currently on chronic steroids    pHTN: Suggested on imaging,  noted    HTN HLD CAD hx PCI: Now with hypotension A-flutter. LFT elevation. Started on midodrine due to hypotension, now blood pressure started trending up so midodrine was held and placing parameters Holding statin due to elevated LFT. Small improvement in transaminitis, Multaq  was discontinued by cardiology. - Continue to monitor  Aflutter: Rate controlled. Cont home Apixaban, Dronedarone  was discontinued -If goes back in A-flutter will need DCCV per cardiology   Aortic ectasia: OP surveillance    Hypothyroidism: Continue home levothyroxine     Chronic anemia:  Stable at baseline.B12 and folate normal.   Thrombocytopenia: Seems chronic Monitor platelet count, today at 97    Chronic pain: Rosacea: continue home Minocycline-after completing antibiotics, continue gabapentin .    Obesity Class 1 Body mass index is 29.24 kg/m.  Placing the pt at higher risk of poor outcomes.   Subjective: Patient was seen and examined today.  Stating that he is little better that he was able to move around in the bed today.  Physical Exam: General.  Frail elderly man, in no acute distress. Pulmonary.  Lungs clear bilaterally, normal respiratory effort. CV.  Regular rate and rhythm, no JVD, rub or murmur. Abdomen.  Soft, nontender, nondistended, BS positive. CNS.  Alert and oriented .  No focal neurologic deficit. Extremities.  Trace LE edema, no cyanosis, pulses intact and symmetrical. Psychiatry.  Judgment and insight appears normal.   Data Reviewed: I have Reviewed nursing notes, Vitals, and Lab results.  Disposition: Status is: Inpatient Remains inpatient appropriate because: Monitor for improvement in volume status  Place TED hose Start: 04/19/24 0741 apixaban (ELIQUIS) tablet 5 mg    Family Communication: Discussed with wife at bedside  Level of care: Progressive   Vitals:   04/26/24 0818 04/26/24 0834 04/26/24 1150 04/26/24 1610  BP:  (!) 126/57 (!) 154/77 127/69   Pulse: 78 (!) 49 83 69  Resp: 20 19 18 17   Temp:  (!) 97.4 F (36.3 C) (!) 97.5 F (36.4 C)   TempSrc:  Oral Oral   SpO2: 100% 91% 98% 100%  Weight:      Height:       This record has been created using Conservation officer, historic buildings. Errors have been sought and corrected,but may not always be located. Such creation errors do not reflect on the standard of care.   Author: Amaryllis Dare, MD 04/26/2024 4:10 PM  Please look on [email or web address] to find out who is on call.

## 2024-04-26 NOTE — Progress Notes (Signed)
 Progress Note  Patient Name: Luis [name] Date of Encounter: 04/26/2024  Primary Cardiologist: [name], MD   Subjective   Feels ok today. No new complaints  Inpatient Medications    Scheduled Meds:  apixaban  5 mg Oral BID   benzonatate  100 mg Oral TID   budesonide -glycopyrrolate-formoterol  2 puff Inhalation BID   Chlorhexidine  Gluconate Cloth  6 each Topical Daily   dextromethorphan   30 mg Oral BID   docusate sodium   100 mg Oral BID   furosemide   20 mg Oral Daily   gabapentin   200 mg Oral QHS   guaiFENesin   600 mg Oral BID   insulin aspart  0-15 Units Subcutaneous TID WC   levothyroxine   50 mcg Oral Q0600   midodrine  10 mg Oral TID WC   minocycline  100 mg Oral Daily   montelukast   10 mg Oral QHS   pantoprazole  40 mg Oral BID   potassium chloride   40 mEq Oral Once   predniSONE   20 mg Oral Q breakfast   Followed by   NOREEN ON 04/29/2024] predniSONE   15 mg Oral Q breakfast   sodium chloride  flush  10-40 mL Intracatheter Q12H   sucralfate  1 g Oral BID   Continuous Infusions:  PRN Meds: acetaminophen , ipratropium-albuterol , LORazepam , melatonin, ondansetron  (ZOFRAN ) IV, mouth rinse, phenol, polyethylene glycol, sodium chloride , sodium chloride  flush   Vital Signs    Vitals:   04/26/24 0500 04/26/24 0818 04/26/24 0834 04/26/24 1150  BP:   (!) 126/57 (!) 154/77  Pulse:  78 (!) 49 83  Resp:  20 19 18   Temp:   (!) 97.4 F (36.3 C) (!) 97.5 F (36.4 C)  TempSrc:   Oral Oral  SpO2:  100% 91% 98%  Weight: 87.3 kg     Height:        Intake/Output Summary (Last 24 hours) at 04/26/2024 1220 Last data filed at 04/26/2024 0923 Gross per 24 hour  Intake 490 ml  Output 1700 ml  Net -1210 ml   Filed Weights   04/24/24 0500 04/25/24 0700 04/26/24 0500  Weight: 91.1 kg 87.9 kg 87.3 kg    Telemetry    Sinus rhythm with frequent PACs - Personally Reviewed  ECG    SR, RBBB & LAFB, PACs - Personally Reviewed  Physical Exam   GEN: No acute  distress.   Neck: No JVD Cardiac: RRR, no murmurs, rubs, or gallops.  Respiratory: Clear to auscultation bilaterally. GI: Soft, nontender, non-distended  MS: No edema; No deformity. Neuro:  Nonfocal  Psych: Normal affect   Labs    Chemistry Recent Labs  Lab 04/24/24 0420 04/25/24 0316 04/26/24 1000  NA 130* 132* 132*  K 4.6 3.8 3.3*  CL 89* 89* 90*  CO2 28 28 29   GLUCOSE 135* 97 150*  BUN 30* 28* 20  CREATININE 1.47* 1.24 1.13  CALCIUM  8.6* 8.3* 8.1*  PROT 5.5* 5.1* 4.8*  ALBUMIN 2.8* 2.4* 2.3*  AST 123* 93* 93*  ALT 122* 101* 91*  ALKPHOS 887* 858* 801*  BILITOT 1.8* 2.1* 1.7*  GFRNONAA 47* 58* >60  ANIONGAP 13 15 13      Hematology Recent Labs  Lab 04/24/24 0420 04/25/24 0316 04/26/24 1000  WBC 14.2* 11.8* 12.3*  RBC 4.16* 3.81* 3.64*  HGB 12.5* 11.5* 11.0*  HCT 38.9* 35.2* 34.0*  MCV 93.5 92.4 93.4  MCH 30.0 30.2 30.2  MCHC 32.1 32.7 32.4  RDW 17.6* 17.8* 18.0*  PLT 144* 112* 97*  Cardiac EnzymesNo results for input(s): TROPONINI in the last 168 hours. No results for input(s): TROPIPOC in the last 168 hours.   BNPNo results for input(s): BNP, PROBNP in the last 168 hours.   DDimer No results for input(s): DDIMER in the last 168 hours.   Summary of Pertinent studies    TTE: LVEF 70 to 70%.  Grade 1 diastolic dysfunction.    Patient Profile     83 y.o. male with a hx of metastatic colon cancer with metasteses to lungs, liver, sarcoidosis, COPD, CAD s/p PCI, HFpEF, aflutter s/p cardioversion, hypertension, HLD, hypothyroidism that presented with progressive worsening of SOB for a week with chest congestion and blood tinged sputum production.  Initial consult was for e evaluation of increased LFTs in the setting of Multaq .  Assessment & Plan    Acute on chronic CHFpEF Continue IV Lasix   Elevated LFTs Dronedarone  held  Atrial flutter status post cardioversion Maintaining sinus rhythm Dronedarone  held Continue eliquis 5mg  PO  BID  DVT Right leg popliteal and posterior tibial veins Continue eliquis  CAD status post PCI Stable Continue eliquis  History of metastatic cancer to liver and lungs     For questions or updates, please contact [facility] Please consult [email or web address] for contact info under Cardiology/STEMI.      Signed, Eulas FORBES Furbish, MD 04/26/2024, 12:20 PM

## 2024-04-27 DIAGNOSIS — J9601 Acute respiratory failure with hypoxia: Secondary | ICD-10-CM | POA: Diagnosis not present

## 2024-04-27 LAB — COMPREHENSIVE METABOLIC PANEL WITH GFR
ALT: 76 U/L — ABNORMAL HIGH (ref 0–44)
AST: 84 U/L — ABNORMAL HIGH (ref 15–41)
Albumin: 2 g/dL — ABNORMAL LOW (ref 3.5–5.0)
Alkaline Phosphatase: 723 U/L — ABNORMAL HIGH (ref 38–126)
Anion gap: 10 (ref 5–15)
BUN: 19 mg/dL (ref 8–23)
CO2: 32 mmol/L (ref 22–32)
Calcium: 7.9 mg/dL — ABNORMAL LOW (ref 8.9–10.3)
Chloride: 93 mmol/L — ABNORMAL LOW (ref 98–111)
Creatinine, Ser: 0.87 mg/dL (ref 0.61–1.24)
GFR, Estimated: >60 mL/min (ref >60)
Glucose, Bld: 86 mg/dL (ref 70–99)
Potassium: 3.8 mmol/L (ref 3.5–5.1)
Sodium: 135 mmol/L (ref 135–145)
Total Bilirubin: 1.6 mg/dL — ABNORMAL HIGH (ref 0.0–1.2)
Total Protein: 4.4 g/dL — ABNORMAL LOW (ref 6.5–8.1)

## 2024-04-27 LAB — CBC
HCT: 31.8 % — ABNORMAL LOW (ref 39.0–52.0)
Hemoglobin: 10 g/dL — ABNORMAL LOW (ref 13.0–17.0)
MCH: 29.9 pg (ref 26.0–34.0)
MCHC: 31.4 g/dL (ref 30.0–36.0)
MCV: 94.9 fL (ref 80.0–100.0)
Platelets: 76 K/uL — ABNORMAL LOW (ref 150–400)
RBC: 3.35 MIL/uL — ABNORMAL LOW (ref 4.22–5.81)
RDW: 17.9 % — ABNORMAL HIGH (ref 11.5–15.5)
WBC: 9.2 K/uL (ref 4.0–10.5)
nRBC: 0 % (ref 0.0–0.2)

## 2024-04-27 LAB — GLUCOSE, CAPILLARY
Glucose-Capillary: 90 mg/dL (ref 70–99)
Glucose-Capillary: 174 mg/dL — ABNORMAL HIGH (ref 70–99)
Glucose-Capillary: 172 mg/dL — ABNORMAL HIGH (ref 70–99)
Glucose-Capillary: 95 mg/dL (ref 70–99)

## 2024-04-27 MED ORDER — OXYCODONE HCL 5 MG PO TABS
5.0000 mg | ORAL_TABLET | ORAL | Status: AC | PRN
  Administered 2024-04-27 – 2024-04-28 (×3): 5 mg via ORAL
  Filled 2024-04-27 (×4): qty 1

## 2024-04-27 MED ORDER — GUAIFENESIN-DM 100-10 MG/5ML PO SYRP
5.0000 mL | ORAL_SOLUTION | ORAL | Status: DC | PRN
  Administered 2024-04-27 – 2024-04-30 (×5): 5 mL via ORAL
  Filled 2024-04-27 (×5): qty 5

## 2024-04-27 NOTE — Progress Notes (Signed)
 Progress Note  Patient Name: Luis [name] Date of Encounter: 04/27/2024  Primary Cardiologist: [name], MD   Subjective   Feels ok today. No new complaints  Inpatient Medications    Scheduled Meds:  apixaban  5 mg Oral BID   benzonatate  100 mg Oral TID   budesonide -glycopyrrolate-formoterol  2 puff Inhalation BID   Chlorhexidine  Gluconate Cloth  6 each Topical Daily   dextromethorphan   30 mg Oral BID   docusate sodium   100 mg Oral BID   furosemide   20 mg Oral Daily   gabapentin   200 mg Oral QHS   guaiFENesin   600 mg Oral BID   insulin aspart  0-15 Units Subcutaneous TID WC   levothyroxine   50 mcg Oral Q0600   midodrine  10 mg Oral TID WC   minocycline  100 mg Oral Daily   montelukast   10 mg Oral QHS   pantoprazole  40 mg Oral BID   predniSONE   20 mg Oral Q breakfast   Followed by   NOREEN ON 04/29/2024] predniSONE   15 mg Oral Q breakfast   sodium chloride  flush  10-40 mL Intracatheter Q12H   sucralfate  1 g Oral BID   Continuous Infusions:  PRN Meds: acetaminophen , ipratropium-albuterol , LORazepam , melatonin, ondansetron  (ZOFRAN ) IV, mouth rinse, phenol, polyethylene glycol, sodium chloride , sodium chloride  flush   Vital Signs    Vitals:   04/26/24 2031 04/26/24 2058 04/26/24 2317 04/27/24 0420  BP: 121/71  (!) 146/79 (!) 143/83  Pulse: 65 71 68 73  Resp: 13 18 18 18   Temp: 97.6 F (36.4 C)  (!) 97.4 F (36.3 C) (!) 97.4 F (36.3 C)  TempSrc: Oral  Oral Oral  SpO2: 100% 100% 100% 100%  Weight:    88.9 kg  Height:        Intake/Output Summary (Last 24 hours) at 04/27/2024 0709 Last data filed at 04/26/2024 2033 Gross per 24 hour  Intake 250 ml  Output 800 ml  Net -550 ml   Filed Weights   04/25/24 0700 04/26/24 0500 04/27/24 0420  Weight: 87.9 kg 87.3 kg 88.9 kg    Telemetry    Sinus rhythm with frequent PACs - Personally Reviewed  ECG    SR, RBBB & LAFB, PACs - Personally Reviewed  Physical Exam   GEN: No acute distress.    Neck: No JVD Cardiac: RRR, no murmurs, rubs, or gallops.  Respiratory: Clear to auscultation bilaterally. GI: Soft, nontender, non-distended  MS: No edema; No deformity. Neuro:  Nonfocal  Psych: Normal affect   Labs    Chemistry Recent Labs  Lab 04/24/24 0420 04/25/24 0316 04/26/24 1000  NA 130* 132* 132*  K 4.6 3.8 3.3*  CL 89* 89* 90*  CO2 28 28 29   GLUCOSE 135* 97 150*  BUN 30* 28* 20  CREATININE 1.47* 1.24 1.13  CALCIUM  8.6* 8.3* 8.1*  PROT 5.5* 5.1* 4.8*  ALBUMIN 2.8* 2.4* 2.3*  AST 123* 93* 93*  ALT 122* 101* 91*  ALKPHOS 887* 858* 801*  BILITOT 1.8* 2.1* 1.7*  GFRNONAA 47* 58* >60  ANIONGAP 13 15 13      Hematology Recent Labs  Lab 04/24/24 0420 04/25/24 0316 04/26/24 1000  WBC 14.2* 11.8* 12.3*  RBC 4.16* 3.81* 3.64*  HGB 12.5* 11.5* 11.0*  HCT 38.9* 35.2* 34.0*  MCV 93.5 92.4 93.4  MCH 30.0 30.2 30.2  MCHC 32.1 32.7 32.4  RDW 17.6* 17.8* 18.0*  PLT 144* 112* 97*    Cardiac EnzymesNo results for  input(s): TROPONINI in the last 168 hours. No results for input(s): TROPIPOC in the last 168 hours.   BNPNo results for input(s): BNP, PROBNP in the last 168 hours.   DDimer No results for input(s): DDIMER in the last 168 hours.   Summary of Pertinent studies    TTE: LVEF 70 to 70%.  Grade 1 diastolic dysfunction. No LVH    Patient Profile     83 y.o. male with a hx of metastatic colon cancer with metasteses to lungs, liver, sarcoidosis, COPD, CAD s/p PCI, HFpEF, aflutter s/p cardioversion, hypertension, HLD, hypothyroidism that presented with progressive worsening of SOB for a week with chest congestion and blood tinged sputum production.  Initial consult was for e evaluation of increased LFTs in the setting of Multaq .  Assessment & Plan    Acute on chronic CHFpEF Continue IV Lasix   Elevated LFTs Dronedarone  held (last dose 11/14 AM).  LFTs pending this AM. The small improvement seen occurred before dronedarone  was held.   Atrial  flutter status post cardioversion Maintaining sinus rhythm Dronedarone  held  Continue eliquis 5mg  PO BID   DVT Right leg popliteal and posterior tibial veins Continue eliquis  CAD status post PCI Stable Continue eliquis  History of metastatic cancer to liver and lungs, sarcoid     For questions or updates, please contact [facility] Please consult [email or web address] for contact info under Cardiology/STEMI.      Signed, Eulas FORBES Furbish, MD 04/27/2024, 7:09 AM

## 2024-04-27 NOTE — Progress Notes (Signed)
 [facility] Progress Note Patient: Luis [name] FMW:[id number] DOB: [birth year]  DOA: 04/07/2024 DOS: the patient was seen and examined on 04/27/2024  Brief Hospital Course: Luis [name] is a 83 y.o. male with PMH of metastatic colon cancer, mets to lungs, liver, LN's, sarcoidosis, COPD, CAD hx PCI, HFpEF, hypertension, hyperlipidemia, hypothyroidism, who presented with progressive shortness of breath onset x 1 wk with exertional dyspnea, chest congestion, minimally productive cough with clear and scant blood-tinged sputum,  Currently being treated for pneumonia versus pneumonitis with acute hypoxic respiratory failure and acute on chronic HFpEF.  11/13: Mildly elevated blood pressure, still significant transaminitis-message sent to Dr. Clancy cardiologist regarding Multaq . Had 1 episode of bleeding while having bowel movement-history of hemorrhoid.  11/14: Hemodynamically stable, hemoglobin with some decreased 11.5, improving creatinine.  Becoming little more short of breath and hypoxic when stood for standing weight.  Giving 1 dose of IV Lasix  at 20 mg and restarting home Lasix . Pending formal cardiology consult regarding the use of Multaq .  11/15: Blood pressure started trending up so holding midodrine and placed parameters.  Multaq  was discontinued by cardiology and if he goes back in a flutter, he will need DCCV.  11/16 still very short of breath, having difficulty talking in full sentences.  Unable to ambulate due to orthostatic hypotension, feels dizzy while standing up.  Patient agreed for SNF placement.  Follow TOC.   Assessment and Plan: Acute respiratory failure with hypoxia  Sarcoidosis flareup with long-term steroid use. Acute on chronic diastolic CHF. Presents with shortness of breath CT chest:Multifocal GGO/mosaic attenuation  LL- suspect multifocal infection/pneumonia-? pneumonitis related to immunotherapy Reports being on steroid for last 2 weeks 20 mg of  prednisone  here needing 4 L [place] ON Eliquis PTA-less likelihood of PE. TTE done- EF 70 to 75% G1 DD RV SF is normal RV size is normal. Blood culture NGTD Was on IV ceftriaxone  and oral doxycycline .  Now back on minocycline. Also on bronchodilators and steroids. S/P bronchoscopy BAL 10/31 - BAL culture-no growth to date AFB fungus PJP DFA- pending.  Cytology-atypical cells. Continue with prednisone  taper, 5 mg taper every week Received IV Lasix  and now on p.o. Lasix  of 20 mg daily.   Pneumomediastinum:  Will monitor closely.   Mild lactic acidosis due to respiratory distress   Acute on chronic HFpEF Chronic leg edema Hypokalemia: weight a week ago was 216 pound He has been intentionally losing weight before that he was 240.Weight at 219 pound on admission now 212>215> 205> 216>214>200>193 and has ongoing leg edema  - Now on home Lasix  of 20 mg daily   Elevated troponin: suspect demand ischemia with flat troponin. TTE is unremarkable  Bleeding per rectum.  Patient had 1 episode of bleeding with bowel movement, history of internal hemorrhoids stating that he sometimes bleeds. GI was initially consulted on admission and later signed off stating that it is either likely internal hemorrhoid or maybe some element of radiation proctitis. - Monitor hemoglobin -We will reconsult GI if bleeding persist   Hypokalemia Hypomagnesemia: Resolved   New onset type 2 diabetes with uncontrolled hyperglycemia: A1c at 7.1.  Initially cbg ~ 400-diet changed to carb controlled,started on glipizide and metformin ( will hold for now) cont ssi., Blood sugar fairly controlled.  Suspect in the setting of his steroid use as well   Metastatic colon cancer, R hemicolectomy, mets to lungs, liver, LN's: Followed by [facility], has known RUL nodule and opacities in the R base ? Lymphangitic spread v.  Inflammation/infection (last in 6/'25). He is currently on maintenance 5FU and Panitumumab  every 2 weeks. Concern  for immunotherapy mediated pneumonitis, patient on a steroid already as per pulmonary see above   Sarcoidosis: Follows with pulmonology, appears that he is currently on chronic steroids    pHTN: Suggested on imaging, noted    HTN HLD CAD hx PCI: Now with hypotension A-flutter. LFT elevation. Started on midodrine due to hypotension, now blood pressure started trending up so midodrine was held and placing parameters Holding statin due to elevated LFT. Small improvement in transaminitis, Multaq  was discontinued by cardiology. - Continue to monitor  Aflutter: Rate controlled. Cont home Apixaban, Dronedarone  was discontinued -If goes back in A-flutter will need DCCV per cardiology   Aortic ectasia: OP surveillance    Hypothyroidism: Continue home levothyroxine     Chronic anemia:  Stable at baseline.B12 and folate normal.   Thrombocytopenia: Seems chronic Monitor platelet count, today at 97    Chronic pain: Rosacea: continue home Minocycline-after completing antibiotics, continue gabapentin .    Obesity Class 1 Body mass index is 29.24 kg/m.  Placing the pt at higher risk of poor outcomes.   Subjective: Patient was seen and examined today.  Patient denied any complaints, he is feeling fine but he cannot ambulate, feels dizzy while standing up because of his blood pressure drops. Patient agreed for SNF placement.   Physical Exam: General.  Frail elderly man, in no acute distress. Pulmonary.  Lungs clear bilaterally, normal respiratory effort. CV.  Irregular rhythm, S1 and S2 audible, no murmur Abdomen.  Soft, nontender, nondistended, BS positive. CNS.  Alert and oriented .  No focal neurologic deficit. Extremities.  2+ LE edema, ace wraps intact Psychiatry.  Judgment and insight appears normal.   Data Reviewed: I have Reviewed nursing notes, Vitals, and Lab results.  Disposition: Status is: Inpatient Remains inpatient appropriate because: Monitor for improvement  in volume status Follow PT and OT eval for possible SNF placement Follow TOC for DC plan  Place TED hose Start: 04/19/24 0741 apixaban (ELIQUIS) tablet 5 mg    Family Communication: Discussed with family at bedside  Level of care: Progressive   Vitals:   04/27/24 0420 04/27/24 0812 04/27/24 0842 04/27/24 1248  BP: (!) 143/83 118/73  115/76  Pulse: 73 77 78 82  Resp: 18 19 17 17   Temp: (!) 97.4 F (36.3 C) 97.8 F (36.6 C)  97.9 F (36.6 C)  TempSrc: Oral Oral  Oral  SpO2: 100% 100% 98% 98%  Weight: 88.9 kg     Height:       This record has been created using Conservation officer, historic buildings. Errors have been sought and corrected,but may not always be located. Such creation errors do not reflect on the standard of care.   Author: Elvan Sor, MD 04/27/2024 2:29 PM  Please look on [email or web address] to find out who is on call.

## 2024-04-27 NOTE — Plan of Care (Signed)
  Problem: Metabolic: Goal: Ability to maintain appropriate glucose levels will improve Outcome: Progressing   Problem: Nutritional: Goal: Maintenance of adequate nutrition will improve Outcome: Progressing Goal: Progress toward achieving an optimal weight will improve Outcome: Progressing   Problem: Skin Integrity: Goal: Risk for impaired skin integrity will decrease Outcome: Progressing   Problem: Education: Goal: Knowledge of General Education information will improve Description: Including pain rating scale, medication(s)/side effects and non-pharmacologic comfort measures Outcome: Progressing   Problem: Clinical Measurements: Goal: Ability to maintain clinical measurements within normal limits will improve Outcome: Progressing Goal: Will remain free from infection Outcome: Progressing Goal: Diagnostic test results will improve Outcome: Progressing Goal: Respiratory complications will improve Outcome: Progressing Goal: Cardiovascular complication will be avoided Outcome: Progressing

## 2024-04-28 ENCOUNTER — Encounter (HOSPITAL_COMMUNITY): Payer: Self-pay | Admitting: Internal Medicine

## 2024-04-28 ENCOUNTER — Other Ambulatory Visit: Payer: Self-pay

## 2024-04-28 DIAGNOSIS — I5033 Acute on chronic diastolic (congestive) heart failure: Secondary | ICD-10-CM | POA: Diagnosis not present

## 2024-04-28 DIAGNOSIS — I2510 Atherosclerotic heart disease of native coronary artery without angina pectoris: Secondary | ICD-10-CM | POA: Diagnosis not present

## 2024-04-28 DIAGNOSIS — I4892 Unspecified atrial flutter: Secondary | ICD-10-CM

## 2024-04-28 DIAGNOSIS — E785 Hyperlipidemia, unspecified: Secondary | ICD-10-CM

## 2024-04-28 DIAGNOSIS — D649 Anemia, unspecified: Secondary | ICD-10-CM

## 2024-04-28 DIAGNOSIS — J9601 Acute respiratory failure with hypoxia: Secondary | ICD-10-CM | POA: Diagnosis not present

## 2024-04-28 DIAGNOSIS — J441 Chronic obstructive pulmonary disease with (acute) exacerbation: Secondary | ICD-10-CM | POA: Diagnosis not present

## 2024-04-28 DIAGNOSIS — Z515 Encounter for palliative care: Secondary | ICD-10-CM | POA: Diagnosis not present

## 2024-04-28 DIAGNOSIS — E1169 Type 2 diabetes mellitus with other specified complication: Secondary | ICD-10-CM

## 2024-04-28 DIAGNOSIS — E876 Hypokalemia: Secondary | ICD-10-CM

## 2024-04-28 DIAGNOSIS — Z7189 Other specified counseling: Secondary | ICD-10-CM

## 2024-04-28 DIAGNOSIS — I10 Essential (primary) hypertension: Secondary | ICD-10-CM

## 2024-04-28 LAB — COMPREHENSIVE METABOLIC PANEL WITH GFR
ALT: 76 U/L — ABNORMAL HIGH (ref 0–44)
AST: 87 U/L — ABNORMAL HIGH (ref 15–41)
Albumin: 2.1 g/dL — ABNORMAL LOW (ref 3.5–5.0)
Alkaline Phosphatase: 786 U/L — ABNORMAL HIGH (ref 38–126)
Anion gap: 8 (ref 5–15)
BUN: 15 mg/dL (ref 8–23)
CO2: 32 mmol/L (ref 22–32)
Calcium: 8.1 mg/dL — ABNORMAL LOW (ref 8.9–10.3)
Chloride: 93 mmol/L — ABNORMAL LOW (ref 98–111)
Creatinine, Ser: 0.75 mg/dL (ref 0.61–1.24)
GFR, Estimated: >60 mL/min (ref >60)
Glucose, Bld: 85 mg/dL (ref 70–99)
Potassium: 3.7 mmol/L (ref 3.5–5.1)
Sodium: 133 mmol/L — ABNORMAL LOW (ref 135–145)
Total Bilirubin: 1.6 mg/dL — ABNORMAL HIGH (ref 0.0–1.2)
Total Protein: 4.6 g/dL — ABNORMAL LOW (ref 6.5–8.1)

## 2024-04-28 LAB — CBC
HCT: 34.2 % — ABNORMAL LOW (ref 39.0–52.0)
Hemoglobin: 10.6 g/dL — ABNORMAL LOW (ref 13.0–17.0)
MCH: 29.5 pg (ref 26.0–34.0)
MCHC: 31 g/dL (ref 30.0–36.0)
MCV: 95.3 fL (ref 80.0–100.0)
Platelets: 82 K/uL — ABNORMAL LOW (ref 150–400)
RBC: 3.59 MIL/uL — ABNORMAL LOW (ref 4.22–5.81)
RDW: 17.9 % — ABNORMAL HIGH (ref 11.5–15.5)
WBC: 9.9 K/uL (ref 4.0–10.5)
nRBC: 0 % (ref 0.0–0.2)

## 2024-04-28 LAB — MAGNESIUM: Magnesium: 1.6 mg/dL — ABNORMAL LOW (ref 1.7–2.4)

## 2024-04-28 LAB — GLUCOSE, CAPILLARY
Glucose-Capillary: 86 mg/dL (ref 70–99)
Glucose-Capillary: 198 mg/dL — ABNORMAL HIGH (ref 70–99)
Glucose-Capillary: 182 mg/dL — ABNORMAL HIGH (ref 70–99)
Glucose-Capillary: 112 mg/dL — ABNORMAL HIGH (ref 70–99)

## 2024-04-28 LAB — PHOSPHORUS: Phosphorus: 3 mg/dL (ref 2.5–4.6)

## 2024-04-28 MED ORDER — MAGNESIUM SULFATE 2 GM/50ML IV SOLN
2.0000 g | Freq: Once | INTRAVENOUS | Status: AC
  Administered 2024-04-28: 2 g via INTRAVENOUS
  Filled 2024-04-28: qty 50

## 2024-04-28 NOTE — Assessment & Plan Note (Signed)
 Continue levothyroxine 

## 2024-04-28 NOTE — Assessment & Plan Note (Signed)
 Continue rate control and anticoagulation Continue telemetry monitoring

## 2024-04-28 NOTE — Assessment & Plan Note (Signed)
 Follow up renal function and electrolytes with serum cr at 1,57 with K at 4,0 and serum bicarbonate at 27  Na 140  Follow up renal function as outpatient.

## 2024-04-28 NOTE — Assessment & Plan Note (Signed)
Last A1C 08/27/20 6.2%  Plan A1C  Sliding scale coverage  Farixga 10  mg daily 

## 2024-04-28 NOTE — Assessment & Plan Note (Addendum)
 No active chest pain, ruled out acute coronary syndrome.  High sensitive troponin elevation due to heart failure and respiratory failure.

## 2024-04-28 NOTE — Progress Notes (Signed)
 Progress Note   Patient: Luis [name] FMW:[id number] DOB: 08-03-1940 DOA: 04/07/2024     21 DOS: the patient was seen and examined on 04/28/2024   Brief hospital course: [name] who presented with COPD exacerbation and decompensated heart failure.   83 y.o. male with PMH of metastatic colon cancer, mets to lungs, liver, LN's, sarcoidosis, COPD, CAD hx PCI, HFpEF, hypertension, hyperlipidemia, hypothyroidism, who presented with progressive shortness of breath onset x 1 wk with exertional dyspnea, chest congestion, minimally productive cough with clear and scant blood-tinged sputum,   S/P bronchoscopy BAL 10/31 - BAL culture-no growth to date AFB fungus PJP DFA- pending.  Cytology-atypical cells. Continue with prednisone  taper.  11/13: Mildly elevated blood pressure, still significant transaminitis-message sent to Dr. Clancy cardiologist regarding Multaq . Had 1 episode of bleeding while having bowel movement-history of hemorrhoid.  11/14: Hemodynamically stable, hemoglobin with some decreased 11.5, improving creatinine.  Becoming little more short of breath and hypoxic when stood for standing weight.  Giving 1 dose of IV Lasix  at 20 mg and restarting home Lasix . Pending formal cardiology consult regarding the use of Multaq .  11/15: Blood pressure started trending up so holding midodrine and placed parameters.  Multaq  was discontinued by cardiology and if he goes back in a flutter, he will need DCCV. 11/17 patient very weak and deconditioned    Assessment and Plan: * Acute exacerbation of COPD with asthma (HCC) Pulmonary sarcoidosis.  Pneumomediastinum   Plan to continue bronchodilator therapy, inhaled and systemic corticosteroids.  Continue airway clearing techniques with flutter valve and incentive spirometer.  Out of bed to chair PT and Ot   Acute on chronic hypoxemic respiratory failure.  02 saturation today is 97% on 2 L/min per [place]   Acute on chronic diastolic CHF  (congestive heart failure) (HCC) Improved volume status.  Continue diuresis with furosemide  20 mg po daily.  Limited medical therapy due to risk of hypotension   CAD (coronary artery disease) No active chest pain, ruled out acute coronary syndrome.  High sensitive troponin elevation due to heart failure and respiratory failure.   Atrial flutter (HCC) Continue rate control and anticoagulation Continue telemetry monitoring   Essential hypertension Continue blood pressure monitoring  Blood pressure support with midodrine   Hypokalemia Continue close follow up renal function and electrolytes   Chronic anemia Follow up hgb stable Chronic thrombocytopenia   Type 2 diabetes mellitus with hyperlipidemia (HCC) Continue glucose cover and monitoring with insulin sliding scale   Hypothyroidism Continue levothyroxine   Colon cancer (HCC) Follow up as outpatient         Subjective: Patient with improvement in dyspnea, no chest pain, continue very weak and deconditioned   Physical Exam: Vitals:   04/28/24 0656 04/28/24 0727 04/28/24 0902 04/28/24 1105  BP:  127/83  132/76  Pulse: 78 72  78  Resp: 18 16  (!) 25  Temp:  (!) 97.3 F (36.3 C)  (!) 97.2 F (36.2 C)  TempSrc:  Oral  Oral  SpO2: 100% 100% 93% 97%  Weight:      Height:       Neurology awake and alert ENT with mild pallor Cardiovascular with S1 and S2 present and regular with no gallops or rubs, positive systolic murmur at the apex Respiratory with poor inspiratory effort with prolonged expiratory phase and scattered rhonchi, on anterior auscultation  Abdomen with no distention  Trace lower extremity edema   Data Reviewed:    Family Communication: I spoke with patient's wife at the  bedside, we talked in detail about patient's condition, plan of care and prognosis and all questions were addressed.   Disposition: Status is: Inpatient Remains inpatient appropriate because: recovering respiratory and heart  failure   Planned Discharge Destination: Skilled nursing facility  Author: Elidia Toribio Furnace, MD 04/28/2024 4:08 PM  For on call review [email or web address].

## 2024-04-28 NOTE — Progress Notes (Addendum)
 Physical Therapy Treatment Patient Details Name: Luis [name] MRN: [id number] DOB: April 23, 1941 Today's Date: 04/28/2024  PT Visit Information  Last PT Received On 04/28/24  Assistance Needed +2 (safety)  History of Present Illness 83 y.o. male presents to [facility] on 04/07/2024 with progressive SOB. Pt underwent bronchoscopy with biopsies 10/31. Symptomatic orthostatic hypotension during PT session 11/7. Diuresis held 11/9 but resumed 11/10 due to ongoing high oxygenation needs. PMH includes metastatic colon CA with mets to lungs, liver, and lymph nodes, sarcoidosis, COPD, CAD, HFpEF, HTN, HLD, hypothyroidism.     PTA Comments: Pt received in supine, alert and agreeable to therapy session, spouse present and encouraging, pt requesting to focus on standing to pivot from bed to chair. Pt able to perform sit<>stand and pivotal steps from bed to chair ~66ft away with support of rollator and up to +75modA. Pt attempted to stand x2 trials from EOB>rollator with +2 minA, but unable to stand until given modA. Pt needs assist to maintain proximity to AD and for weight shifting while stepping. Due to DOE 4/4 with postural changes, pt increased to 3L HF [place] for activity and SpO2 remains 89-93% on 3L/min O2 for remainder of session, RN notified. Pt also requesting abdominal binder or compression for ribcage area due to sensation of pulled muscle with pt pointing to L diaphragm area. Pt/spouse interested in discussing post-acute disposition plan; based on current level of function, pt not likely to tolerate 3 hours of therapy a day, spouse agrees to this, therefore currently recommend continued inpatient follow up therapy, <3 hours/day. PTA also discussed disposition with supervising PT Logan B and case mgmt. Spouse interested in seeing if pt would be eligible for LTACH setting vs SNF pending progress given variable O2 needs during sessions and difficulty with UE/LE edema and other comorbidities.      04/28/24 1214  Orthostatic Lying   BP- Lying (!) 142/100 (110)  Pulse- Lying 88  Orthostatic Sitting EOB  BP- Sitting 129/76 (91)  Pulse- Sitting 85   Orthostatic Sitting EOB  BP- Sitting 120/77 (after unsuccessful standing trial)  Pulse- Sitting 84   Orthostatic Sitting  BP- Sitting 117/75 (88)  (after pivot transfer from EOB to chair, pt with BLE buckling as he fatigues)  Pulse- Sitting 87     Subjective Data  Patient Stated Goal Improve activity tolerance and not need oxygen  Precautions  Precautions Fall  Recall of Precautions/Restrictions Intact  Precaution/Restrictions Comments orthostatic 11/13, DOE with minimal activity  Restrictions  Weight Bearing Restrictions Per Provider Order No  Pain Assessment  Pain Assessment 0-10  Pain Score 5  Faces Pain Scale 4  Pain Location L chest area due to coughing last night (pt indicating near diaphragm)  Pain Descriptors / Indicators Discomfort;Grimacing;Guarding;Sore  Pain Intervention(s) Limited activity within patient's tolerance;Monitored during session;Repositioned  Cognition  Arousal Alert  Behavior During Therapy [facility] for tasks assessed/performed;Impulsive  PT - Cognitive impairments Safety/Judgement;Attention  PT - Cognition Comments Internally distracted due to increased WOB with functional tasks. Pt with slow processing while standing, dyspnea limiting pt ability to report his symptoms. Decreased carryover of postural cues due to pt fatigue and pt reports unable to stop from flexing his trunk forward while winded.  Following Commands  Following commands Intact  Cueing  Cueing Techniques Verbal cues;Visual cues;Gestural cues  Communication  Communication No apparent difficulties  Bed Mobility  Overal bed mobility Needs Assistance  Bed Mobility Supine to Sit  Supine to sit Supervision;HOB elevated  General bed mobility comments  No bed rails to simulate home environment. From HOB 30 deg angle, to L EOB; increased time  to perform. Would benefit from supervision for safety.  Transfers  Overall transfer level Needs assistance  Equipment used Rollator (4 wheels)  Transfers Sit to/from Stand;Bed to chair/wheelchair/BSC  Sit to Stand Mod assist;+2 physical assistance;+2 safety/equipment  Bed to/from chair/wheelchair/BSC transfer type: Step pivot  Step pivot transfers Min assist;+2 safety/equipment progressing to modA +2 as he fatigues  General transfer comment modA +2 for lift and lowering assist. Pt requires cues for posture when transfering along with sequencing cues.  Ambulation/Gait  Ambulation/Gait assistance +2 physical assistance;+2 safety/equipment;Mod assist  Gait Distance (Feet) 5 Feet  Assistive device Rollator (4 wheels)  Gait Pattern/deviations Step-to pattern;Trunk flexed;Shuffle;Decreased stride length;Knees buckling  General Gait Details SPTA defer longer distance due to pt c/o weakness in LEs and SOB. Pt able to take pivotal steps towards chair with rollator and +2 A for safety and equipement mgmt.  Gait velocity reduced  Balance  Overall balance assessment Needs assistance  Sitting-balance support Feet supported  Sitting balance-Leahy Scale Good  Sitting balance - Comments sits EOB without support  Standing balance support Reliant on assistive device for balance;During functional activity;Bilateral upper extremity supported  Standing balance-Leahy Scale Poor  Standing balance comment Bil UE support on Rollator, CGA to minA for safety  General Comments  General comments (skin integrity, edema, etc.) Pt on 2L HF [place] upon arrival, increased to 3L HF [place] with exertion. Pt ambulated to chair on 3L [place] with sats around 90%. Pt requires increased time to recover once seated. DOE 3/4 after sit>stand  PT - End of Session  Equipment Utilized During Treatment Oxygen;Gait belt  Activity Tolerance Patient limited by fatigue (Orthostatic hypotension)  Patient left with call bell/phone within reach;in  chair;with chair alarm set;with family/visitor present (pt heels floated)  Nurse Communication Mobility status;Other (comment) (Keep watch on O2 sats)   PT - Assessment/Plan  PT Visit Diagnosis Other abnormalities of gait and mobility (R26.89);Difficulty in walking, not elsewhere classified (R26.2);Muscle weakness (generalized) (M62.81)  PT Frequency (ACUTE ONLY) Min 2X/week  Follow Up Recommendations Skilled nursing-short term rehab (<3 hours/day)  Can patient physically be transported by private vehicle No  Patient can return home with the following Assistance with cooking/housework;Assist for transportation;Help with stairs or ramp for entrance;A little help with walking and/or transfers;A little help with bathing/dressing/bathroom  PT equipment Other (comment);Wheelchair cushion (measurements PT);Wheelchair (measurements PT);BSC/3in1 (pending progress; pt has rollator at home, also likely to need supplemental O2)  AM-PAC PT 6 Clicks Mobility Outcome Measure (Version 2)  Help needed turning from your back to your side while in a flat bed without using bedrails? 3  Help needed moving from lying on your back to sitting on the side of a flat bed without using bedrails? 3  Help needed moving to and from a bed to a chair (including a wheelchair)? 2  Help needed standing up from a chair using your arms (e.g., wheelchair or bedside chair)? 2  Help needed to walk in hospital room? 1  Help needed climbing 3-5 steps with a railing?  1  6 Click Score 12  Consider Recommendation of Discharge To: CIR/SNF/LTACH  Progressive Mobility  What is the highest level of mobility based on the mobility assessment? Level 3 (Stands with assistance) - Balance while standing  and cannot march in place  Mobility Referral  (+2)  Activity Pivoted/transferred from bed to chair;Stood at bedside  PT Goal Progression  Progress  towards PT goals Progressing toward goals  Acute Rehab PT Goals  PT Goal Formulation With  patient/family  Time For Goal Achievement 05/06/24  PT Time Calculation  PT Start Time (ACUTE ONLY) 1158  PT Stop Time (ACUTE ONLY) 1232  PT Time Calculation (min) (ACUTE ONLY) 34 min  PT General Charges  $$ ACUTE PT VISIT 1 Visit  PT Treatments  $Therapeutic Activity 23-37 mins

## 2024-04-28 NOTE — Progress Notes (Addendum)
 Progress Note  Patient Name: Luis [name] Date of Encounter: 04/28/2024 [facility] Cardiologist: [name], MD   Interval Summary   Reported his shortness of breath is better since admission, though last night had a 2 hour coughing fit that has resulted in chest pain worse with inspiration. No CP prior to this episode this admission.  Did get up and move around today, shared it was difficult dur to weakness and his BP dropped  Vital Signs Vitals:   04/28/24 0656 04/28/24 0727 04/28/24 0902 04/28/24 1105  BP:  127/83  132/76  Pulse: 78 72  78  Resp: 18 16  (!) 25  Temp:  (!) 97.3 F (36.3 C)  (!) 97.2 F (36.2 C)  TempSrc:  Oral  Oral  SpO2: 100% 100% 93% 97%  Weight:      Height:        Intake/Output Summary (Last 24 hours) at 04/28/2024 1238 Last data filed at 04/28/2024 9375 Gross per 24 hour  Intake 720 ml  Output 1200 ml  Net -480 ml      04/28/2024    3:17 AM 04/27/2024    4:20 AM 04/26/2024    5:00 AM  Last 3 Weights  Weight (lbs) 199 lb 1.2 oz 195 lb 15.8 oz 192 lb 7.4 oz  Weight (kg) 90.3 kg 88.9 kg 87.3 kg      Telemetry/ECG  Sinus rhythm with 1st degree and occasional PAC Hr 65-70 - Personally Reviewed  Physical Exam  GEN: No acute distress.   Neck: No JVD Cardiac: RRR, 1/6 systolic murmur heard best at pulmonic area. Radial pulses 2+ bilaterally  Respiratory: Clear to auscultation bilaterally, though diminished GI: Soft, nontender, non-distended  MS: Trace pitting edema with unna boots in place  RHC/LHC in 04/2022 for DOE     Patent stents in the LAD and diagonal.  Prior diagonal angioplasty site also widely patent.   Previously placed Ramus stent of unknown type is  widely patent.   The left ventricular systolic function is normal.   LV end diastolic pressure is normal.   The left ventricular ejection fraction is 55-65% by visual estimate.   There is no aortic valve stenosis.   Apical LAD with 70% lesion.  This is to distal and  too small for PCI.  Not much myocardium supplied from this vessel.   Aortic saturation 94%, PA saturation 74%, PA pressure 40/14, mean PA pressure 23 mmHg, mean pulmonary capillary wedge pressure 15 mmHg, cardiac output 8.4 L/min, cardiac index 3.73.  Echocardiogram 03/2024 IMPRESSIONS     1. Left ventricular ejection fraction, by estimation, is 70 to 75%. The  left ventricle has hyperdynamic function. The left ventricle has no  regional wall motion abnormalities. Left ventricular diastolic parameters  are consistent with Grade I diastolic  dysfunction (impaired relaxation).   2. Right ventricular systolic function is normal. The right ventricular  size is normal.   3. The mitral valve is degenerative. No evidence of mitral valve  regurgitation.   4. Difficult assessment for aortic stenosis, poor LVOT Doppler  evaluation. Consider future dedicated aortic stenosis study as outpatient.  The aortic valve was not well visualized. Aortic valve regurgitation is  not visualized. Aortic valve sclerosis is  present, with no evidence of aortic valve stenosis.   5. The inferior vena cava is normal in size with greater than 50%  respiratory variability, suggesting right atrial pressure of 3 mmHg.    Assessment & Plan  83 y.o. male  with a hx of metastatic colon cancer with metasteses to lungs, liver, sarcoidosis, COPD, CAD s/p PCI, HFpEF, aflutter s/p cardioversion, hypertension, HLD, hypothyroidism that presented with progressive worsening of SOB for a week with chest congestion and blood tinged sputum production on 10/27.  Cardiology consulted on 11/14 for evaluation of increased LFTs in the setting of Multaq .  Acute on chronic HFpEF  Demand Ischemia [29 -> 33] Hypoalbuminemia [2.1] Reported that he has noticed improvement in symptoms since admission.  Weight 211-> 199 lbs Net IO Since Admission: -19,278.27 mL [04/28/24 1359] Still appears volume up, though could be confounded by  hypoalbuminemia.   Continue lasix  20 mg Further medication titration limited by hypotension   Atrial flutter s/p cardioversion Conduction disease [ RBBB, LAFB, and 1st Degree AV block ] Multaq  discontinued this admission 2/2 elevated LFTs  Currently in sinus rhythm  Avoid AV nodal blocking agents  Continue eliquis 5 mg  Keep K > 4 and Mag > 2. Magnesium  repleted today.   Hypotension Hx of Hypertension BP:132/76 Orthostatic vitals today positive BP: 142/100 lying, BP 117/75 sitting.  Continue midodrine 10 mg TID    Hyperlipidemia Statin and zetia  held 2/2 elevated LFTs, improving though would still continue to hold  Hx of DVT (?) US  12/2023 stated indeterminate right leg popliteal and posterior tibial veins Continue eliquis as above  CAD s/p PCI to Ramus, LAD, and Diag Chest pain is sounds pleuritic vs MSK in etiology, denied anginal chest pain Defer antiplatelet 2/2 chronic anticoagulation  Stopped ranexa  at last visit with Dr. [place], to resume if he develops anginal chest pain  [facility] Palliative Care consult pending.  Per primary Sarcoidosis exacerbation with  Acute respiratory failure with hypoxia Pneumomediastinum Metastatic colon cancer  Hypothyroidism COPD Hemorrhoids Electrolyte Disturbances  T2DM Thrombocytopenia Rosacea Chronic pain   For questions or updates, please contact [facility] Please consult [email or web address] for contact info under       Signed, Leontine LOISE Salen, PA-C    ________  Patient seen and examined, note reviewed with the signed Advanced Practice Provider. I personally reviewed laboratory data, imaging studies and relevant notes. I independently examined the patient and formulated the important aspects of the plan. I have personally discussed the plan with the patient and/or family. Comments or changes to the note/plan are indicated below.  HPI: 83 year old male with past medical history of metastatic colon cancer and mets to  lungs, liver, sarcoidosis, COPD, CAD s/p PCI, HFpEF, atrial flutter s/p cardioversion, hypertension, hyperlipidemia, hypothyroidism who presented with progressive worsening of shortness of breath x 1 week with chest congestion and blood-tinged sputum.  Cardiology consult 11/14 for evaluation of increased LFTs in the setting of Multaq .  Multaq  is since been discontinued.  My Exam:  Physical Exam Vitals and nursing note reviewed.  Constitutional:      Appearance: Normal appearance. He is ill-appearing.  HENT:     Head: Normocephalic and atraumatic.  Eyes:     Conjunctiva/sclera: Conjunctivae normal.  Cardiovascular:     Rate and Rhythm: Normal rate and regular rhythm.  Pulmonary:     Effort: Pulmonary effort is normal.     Breath sounds: Normal breath sounds.  Musculoskeletal:     Comments: Right upper extremity swelling  Skin:    Coloration: Skin is not jaundiced or pale.  Neurological:     Mental Status: He is alert.      Telemetry: Sinus rhythm- Personally reviewed EKG: Sinus rhythm with first-degree AV block, right bundle branch block and  left anterior fascicular block- Personally reviewed Echo: EF 70-75% with grade 1 diastolic dysfunction with poorly assessed aortic valve- Personally reviewed  Assessment & Plan:  Acute on chronic diastolic heart failure-remains volume overloaded but orthostatic vitals make it difficult for volume optimization can consider albumin and low-dose Lasix  drip Metastatic colon cancer agree with palliative care discussion.   Elevated troponin secondary to demand ischemia Atrial flutter s/p cardioversion-currently off Multaq  due to his elevated LFTs Bifascicular block CAD s/p PCI to ramus, LAD and diagonal Hyperlipidemia statin and Zetia  held for LFTs Orthostatic hypotension-continue midodrine  Time coordinating patient care: 33 minutes  Signed, Emeline Calender, DO [place]  [facility]  04/28/2024 3:30 PM

## 2024-04-28 NOTE — Assessment & Plan Note (Signed)
 Improved volume status.  Continue diuresis with furosemide  20 mg po daily.  Limited medical therapy due to risk of hypotension

## 2024-04-28 NOTE — Consult Note (Addendum)
 Consultation Note Date: 04/28/2024   Patient Name: Luis [name]  DOB: Feb 27, 1941  MRN: [id number]  Age / Sex: 83 y.o., male  PCP: Luis Charlene CROME, MD Referring Physician: Noralee Elidia Toribio [name]  Reason for Consultation: Establishing goals of care-- overall poor prognosis  HPI/Patient Profile: 83 y.o. male   admitted on 04/07/2024 PMH of metastatic colon cancer with metasteses to lungs/ liver, sarcoidosis, COPD, CAD s/p PCI, HFpEF, aflutter s/p cardioversion, hypertension, HLD, hypothyroidism that presented with progressive worsening of SOB for a week with chest congestion and blood tinged sputum production.   Admitted for treatment and stabilization. Projected hospital stay, day 21 of this hosptial stay.  Patient and family face treatment option decisions, advanced directive decisions and anticipatory care needs.    Clinical Assessment and Goals of Care:  This NP Luis [name] reviewed medical records, received report from team, assessed the patient and then meet at the patient's bedside along with his wife to discuss diagnosis, prognosis, GOC, EOL wishes disposition and options.   Concept of Palliative Care was introduced as specialized medical care for people and their families living with serious illness.  If focuses on providing relief from the symptoms and stress of a serious illness.  The goal is to improve quality of life for both the patient and the family.  Values and goals of care important to patient and family were attempted to be elicited.  Created space and opportunity for patient  and family to explore thoughts and feelings regarding current medical situation.   Patient details his oncologic journey starting in 2019, currently he is followed at [facility].  He is hopeful for ongoing cancer treatment.  He speaks optimistically about his ability to overcome his current situation.  I've done it  before  Education offered on the significance of his multiple comorbidities and its impact on ability to receive and tolerate cancer care.  We discussed his history of sarcoidosis and COPD,  heart failure and coronary artery disease, within the context of human mortality and failure to thrive.   Education offered on the importance of mobility within the context of overall health and wellness.    Education offered on self responsibility and active range of motion and requests for out of bed to chair and mobility.     A  discussion was had today regarding advanced directives.  Concepts specific to code status, artifical feeding and hydration, continued IV antibiotics and rehospitalization was had.    The difference between a aggressive medical intervention path  and a palliative comfort care path for this patient at this time was had.     For now patient is very clear; he is open to all offered and available medical interventions to prolong life, he is hopeful for improvement.  Patient is talkative and shares his love and appreciation for his wife/Luis [name].  Patient himself has 2 children, and Luis [name] has 2 children both from previous marriages this year multiple grandchildren.  Patient was born in [place]  served in the [employer] in [place] and  was an art gallery manager by trade.  He self-reports himself as  determined.      Questions and concerns addressed.  Patient/family  encouraged to call with questions or concerns.     PMT will continue to support holistically.         Patient and his wife tell me they have H POA and advance care planning documents.  She will bring them him for scanning, they tell me today,    wife is named H POA        SUMMARY OF RECOMMENDATIONS    Code Status/Advance Care Planning: Full code Educated offered to consider DNR/DNI status understanding evidenced based poor outcomes in similar hospitalized patient, as the cause of arrest is likely associated with advanced  chronic illness rather than an easily reversible acute cardio-pulmonary event.   Patient verbalizes understanding of this information however at this time he chooses to remain full code and let things play out as they well.   Symptom Management:  Per attending  Palliative Prophylaxis:  Bowel Regimen, Delirium Protocol, and Frequent Pain Assessment  Additional Recommendations (Limitations, Scope, Preferences): Full Scope Treatment Patient is open to all offered and available medical interventions to prolong life.  He is hopeful for improvement to regain the ability for outpatient rehabilitation.  Ultimately patient hopes to return home and to resume oncology care at [facility]  Psycho-social/Spiritual:  Desire for further Chaplaincy support:yes Additional Recommendations: Emotional support offered  Prognosis:  Unable to determine  Discharge Planning: To Be Determined      Primary Diagnoses: Present on Admission:  Acute hypoxic respiratory failure (HCC)   I have reviewed the medical record, interviewed the patient and family, and examined the patient. The following aspects are pertinent.  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 08/2014 Cath/PCI: LM nl, LAD 30p, D1 95 (2.25x12 Resolute Integrity DES), LCX small, RI 100 (attempted PCI) - branches fill via L->L collats, RCA dominant, nl, RPDA/PLA nl, EF 60^. b. 02/24/2015 PCI CTO of Ramus DES x2.   Chronic bronchitis (HCC)    hx   Diverticulosis 05/2005   Elevated lipase    Fuchs' corneal dystrophy    History of adenomatous polyp of colon 05/2005   8 mm adenoma   History of blood transfusion    related to some of my surgeries   Hyperlipidemia    Hypertension     Iron deficiency anemia due to chronic blood loss 06/21/2017   Pneumonia    Prostate cancer (HCC) 2011   S/P seed implant   Sarcoidosis    Thrombocytopenia    a. Noted on prior labs, unclear of what w/u done.   Trifascicular block    Social History   Socioeconomic History   Marital status: Married    Spouse name: Not on file   Number of children: 2   Years of education: Not on file   Highest education level: Not on file  Occupational History   Occupation: Unemployed  Tobacco Use   Smoking status: Former    Types: Cigars   Smokeless tobacco: Never   Tobacco comments:    Has stopped smoking and is no longer smoking cigars as of 2024.   Vaping Use   Vaping  status: Never Used  Substance and Sexual Activity   Alcohol use: Yes    Alcohol/week: 2.0 standard drinks of alcohol    Types: 1 Glasses of wine, 1 Standard drinks or equivalent per week    Comment: Drinks maybe 1-2 drinks a week or less   Drug use: No   Sexual activity: Not on file  Other Topics Concern   Not on file  Social History Narrative   Lives at home wife.     Social Drivers of Corporate Investment Banker Strain: Not on file  Food Insecurity: No Food Insecurity (04/07/2024)   Hunger Vital Sign    Worried About Running Out of Food in the Last Year: Never true    Ran Out of Food in the Last Year: Never true  Transportation Needs: No Transportation Needs (04/07/2024)   PRAPARE - Administrator, Civil Service (Medical): No    Lack of Transportation (Non-Medical): No  Physical Activity: Not on file  Stress: Not on file  Social Connections: Socially Integrated (04/07/2024)   Social Connection and Isolation Panel    Frequency of Communication with Friends and Family: More than three times a week    Frequency of Social Gatherings with Friends and Family: Three times a week    Attends Religious Services: More than 4 times per year    Active Member of Clubs or Organizations: Yes    Attends Museum/gallery Exhibitions Officer: More than 4 times per year    Marital Status: Married   Family History  Problem Relation Age of Onset   Heart disease Father 35       Died from hardening of the arteries age 65   Coronary artery disease Sister 56   Heart attack Sister    Colon cancer Neg Hx    Throat cancer Neg Hx    Pancreatic cancer Neg Hx    Prostate cancer Neg Hx    Stroke Neg Hx    Hypertension Neg Hx    Scheduled Meds:  apixaban   5 mg Oral BID   benzonatate   100 mg Oral TID   budesonide -glycopyrrolate -formoterol   2 puff Inhalation BID   Chlorhexidine  Gluconate Cloth  6 each Topical Daily   dextromethorphan   30 mg Oral BID   docusate sodium   100 mg Oral BID   furosemide   20 mg Oral Daily   gabapentin   200 mg Oral QHS   guaiFENesin   600 mg Oral BID   insulin  aspart  0-15 Units Subcutaneous TID WC   levothyroxine   50 mcg Oral Q0600   midodrine   10 mg Oral TID WC   minocycline   100 mg Oral Daily   montelukast   10 mg Oral QHS   pantoprazole   40 mg Oral BID   [START ON 04/29/2024] predniSONE   15 mg Oral Q breakfast   sodium chloride  flush  10-40 mL Intracatheter Q12H   sucralfate   1 g Oral BID   Continuous Infusions: PRN Meds:.acetaminophen , guaiFENesin -dextromethorphan , ipratropium-albuterol , LORazepam , melatonin, ondansetron  (ZOFRAN ) IV, mouth rinse, oxyCODONE , phenol, polyethylene glycol, sodium chloride , sodium chloride  flush Medications Prior to Admission:  Prior to Admission medications   Medication Sig Start Date End Date Taking? Authorizing Provider  albuterol  (PROVENTIL ) (2.5 MG/3ML) 0.083% nebulizer solution INHALE 3 ML BY NEBULIZATION EVERY 6 HOURS AS NEEDED FOR WHEEZING OR SHORTNESS OF BREATH 11/12/23  Yes Cobb, Comer GAILS, NP  albuterol  (VENTOLIN  HFA) 108 (90 Base) MCG/ACT inhaler INHALE 1-2 PUFFS BY MOUTH EVERY 6 HOURS AS NEEDED FOR WHEEZE  OR SHORTNESS OF BREATH 07/03/22  Yes Hunsucker, Donnice SAUNDERS, MD  amoxicillin -clavulanate (AUGMENTIN ) 875-125 MG tablet Take 1 tablet by  mouth 2 (two) times daily. 04/03/24  Yes Hope Almarie ORN, NP  apixaban  (ELIQUIS ) 5 MG TABS tablet Take 5 mg by mouth 2 (two) times daily. 01/17/24  Yes [provider]  budesonide -glycopyrrolate -formoterol  (BREZTRI  AEROSPHERE) 160-9-4.8 MCG/ACT AERO inhaler Inhale 2 puffs into the lungs in the morning and at bedtime. 04/03/24  Yes Hope Almarie ORN, NP  cetirizine  (ZYRTEC ) 10 MG tablet Take 1 tablet (10 mg total) by mouth daily. Patient taking differently: Take 10 mg by mouth daily as needed for allergies. 09/13/23  Yes Meade Verdon RAMAN, MD  Cholecalciferol  (VITAMIN D3) 2000 units capsule Take 2,000 Units by mouth daily.   Yes [provider]  dronedarone  (MULTAQ ) 400 MG tablet Take 1 tablet (400 mg total) by mouth 2 (two) times daily with a meal. 02/12/24  Yes Ladona Heinz, MD  Dupilumab  (DUPIXENT ) 300 MG/2ML SOAJ Inject 300 mg into the skin every 14 (fourteen) days. 03/31/24  Yes Hunsucker, Donnice SAUNDERS, MD  ezetimibe  (ZETIA ) 10 MG tablet TAKE 1 TABLET BY MOUTH EVERY DAY 01/01/24  Yes Swinyer, Rosaline HERO, NP  fluticasone  (FLONASE ) 50 MCG/ACT nasal spray Place 2 sprays into both nostrils daily. 09/25/23  Yes Hunsucker, Donnice SAUNDERS, MD  furosemide  (LASIX ) 20 MG tablet Take 1 tablet (20 mg total) by mouth daily as needed for fluid or edema. Patient taking differently: Take 20 mg by mouth daily. 01/08/24  Yes Ladona Heinz, MD  gabapentin  (NEURONTIN ) 100 MG capsule Take 200 mg by mouth at bedtime.   Yes [provider]  guaiFENesin -dextromethorphan  (ROBITUSSIN DM) 100-10 MG/5ML syrup Take 10 mLs by mouth every 6 (six) hours as needed for cough.   Yes [provider]  ibuprofen (ADVIL) 200 MG tablet Take 400 mg by mouth every 6 (six) hours as needed for moderate pain (pain score 4-6) or mild pain (pain score 1-3).   Yes [provider]  levothyroxine  (SYNTHROID ) 50 MCG tablet Take 50 mcg by mouth daily before breakfast. 02/20/23  Yes [provider]  magnesium  oxide  (MAG-OX) 400 MG tablet Take 1 tablet by mouth 2 (two) times daily.   Yes [provider]  metroNIDAZOLE (METROCREAM) 0.75 % cream Apply 1 Application topically 2 (two) times daily as needed (rash). 09/21/23  Yes [provider]  minocycline  (MINOCIN ) 100 MG capsule Take 100 mg by mouth daily. 12/09/23  Yes [provider]  montelukast  (SINGULAIR ) 10 MG tablet Take 1 tablet (10 mg total) by mouth at bedtime. 09/13/23  Yes Desai, Nikita S, MD  Multiple Vitamins-Minerals (VITRUM SENIOR) TABS Take 1 tablet by mouth daily.   Yes [provider]  Naphazoline-Pheniramine ([employer] EYE ALLERGY RELIEF) 0.027-0.315 % SOLN Place 1 drop into both eyes 2 (two) times daily.   Yes [provider]  naproxen  sodium (ALEVE ) 220 MG tablet Take 440 mg by mouth 2 (two) times daily as needed (pain).   Yes [provider]  potassium chloride  (KLOR-CON ) 10 MEQ tablet Take 10 mEq by mouth daily. 02/17/24 02/16/25 Yes [provider]  predniSONE  (DELTASONE ) 5 MG tablet Take 3 tablets (15mg ) daily 04/03/24  Yes Hope Almarie ORN, NP  rosuvastatin  (CRESTOR ) 20 MG tablet TAKE 1 TABLET BY MOUTH EVERY DAY 02/07/24  Yes Ladona Heinz, MD  traMADol  (ULTRAM ) 50 MG tablet Take 2 tablets (100 mg total) by mouth every 6 (six) hours as needed. 05/29/21  Yes Ebbie Donnice,  MD  nitroGLYCERIN  (NITROSTAT ) 0.4 MG SL tablet PLACE 1 TABLET UNDER THE TONGUE EVERY 5 MINUTES AS NEEDED FOR CHEST PAIN FOR 3 DOSES Patient not taking: Reported on 04/07/2024 08/15/23   Lelon Hamilton T, PA-C  ranolazine  (RANEXA ) 500 MG 12 hr tablet TAKE 1 TABLET BY MOUTH TWICE A DAY Patient not taking: Reported on 04/07/2024 10/04/23   Lelon Hamilton T, PA-C   Allergies  Allergen Reactions   Losartan  Potassium Other (See Comments)    Hyperkalemia   Review of Systems  Constitutional:  Positive for fatigue.  Respiratory:  Positive for cough.   Neurological:  Positive for weakness.    Physical Exam Cardiovascular:      Rate and Rhythm: Normal rate.  Pulmonary:     Effort: Respiratory distress present.  Skin:    General: Skin is warm and dry.  Neurological:     Mental Status: He is alert and oriented to person, place, and time.     Vital Signs: BP 127/83 (BP Location: Right Arm)   Pulse 72   Temp (!) 97.3 F (36.3 C) (Oral)   Resp 16   Ht 5' 11 (1.803 m)   Wt 90.3 kg   SpO2 100%   BMI 27.77 kg/m  Pain Scale: 0-10 POSS *See Group Information*: 1-Acceptable,Awake and alert Pain Score: 5    SpO2: SpO2: 100 % O2 Device:SpO2: 100 % O2 Flow Rate: .O2 Flow Rate (L/min): 2 L/min  IO: Intake/output summary:  Intake/Output Summary (Last 24 hours) at 04/28/2024 0904 Last data filed at 04/28/2024 9375 Gross per 24 hour  Intake 720 ml  Output 1850 ml  Net -1130 ml    LBM: Last BM Date : 04/24/24 Baseline Weight: Weight: 99.6 kg Most recent weight: Weight: 90.3 kg     Palliative Assessment/Data:  40 %      Time: 75 minutes   Signed by: Luis Plants, NP   Please contact Palliative Medicine Team phone at [phone] for questions and concerns.  For individual provider: See Tracey

## 2024-04-28 NOTE — Progress Notes (Signed)
   04/28/24 0429  Respiratory Severity Assessment  $ Protocol Assessment  Yes  Heart Rate 0  Breath Sounds 3  Respiratory Pattern 0  Cough 1  Chest X Ray 0  O2 Requirements/ Pulse Ox Sat(%) 1  Mental Status 0  Dyspnea 1  Score Total 6  Aerosolized Bronchodilators  Aerosolized bronchodilator indications Aerosolized bronchodilator indicated  Bronchial Hygiene  Bronchial Hygiene Plan of Care Acapella device  Oxygen Therapy  Oxygen therapy indications Oxygen therapy indicated  Oxygen Plan of care Nasal cannula  Respiratory Therapy Follow Up Assessment  Assessment follow up date 04/29/24  Follow-up assessment complete Yes   No recent CXR, no med changes, pt feels congested and has violent cough at times that makes him SOB, states he is having a hard time getting it up. Ordered flutter and educated patient on use. Pt is able to do it independently.

## 2024-04-28 NOTE — Assessment & Plan Note (Signed)
Continue blood pressure monitoring.  Blood pressure support with midodrine.

## 2024-04-28 NOTE — Assessment & Plan Note (Signed)
 Follow up as outpatient

## 2024-04-28 NOTE — Assessment & Plan Note (Addendum)
 Pulmonary sarcoidosis.  Pneumomediastinum   Plan to continue bronchodilator therapy, inhaled and systemic corticosteroids.  Continue airway clearing techniques with flutter valve and incentive spirometer.  Out of bed to chair PT and Ot  Will resume systemic steroids, will need PJP prophylaxis with bactrim   Acute on chronic hypoxemic respiratory failure. 11/18 follow up chest radiograph with bilateral basal and left upper lobe interstitial infiltrates, stable to improved compared to prior.   02 saturation today is 99% on 3 L/min per [place]

## 2024-04-28 NOTE — Assessment & Plan Note (Signed)
 Follow up hgb stable Chronic thrombocytopenia

## 2024-04-29 ENCOUNTER — Inpatient Hospital Stay (HOSPITAL_COMMUNITY)

## 2024-04-29 ENCOUNTER — Inpatient Hospital Stay: Admitting: Pulmonary Disease

## 2024-04-29 DIAGNOSIS — Z515 Encounter for palliative care: Secondary | ICD-10-CM | POA: Diagnosis not present

## 2024-04-29 DIAGNOSIS — J90 Pleural effusion, not elsewhere classified: Secondary | ICD-10-CM | POA: Diagnosis not present

## 2024-04-29 DIAGNOSIS — I700 Atherosclerosis of aorta: Secondary | ICD-10-CM | POA: Diagnosis not present

## 2024-04-29 DIAGNOSIS — I2510 Atherosclerotic heart disease of native coronary artery without angina pectoris: Secondary | ICD-10-CM | POA: Diagnosis not present

## 2024-04-29 DIAGNOSIS — R918 Other nonspecific abnormal finding of lung field: Secondary | ICD-10-CM | POA: Diagnosis not present

## 2024-04-29 DIAGNOSIS — I4892 Unspecified atrial flutter: Secondary | ICD-10-CM | POA: Diagnosis not present

## 2024-04-29 DIAGNOSIS — E039 Hypothyroidism, unspecified: Secondary | ICD-10-CM

## 2024-04-29 DIAGNOSIS — I5033 Acute on chronic diastolic (congestive) heart failure: Secondary | ICD-10-CM | POA: Diagnosis not present

## 2024-04-29 DIAGNOSIS — T797XXA Traumatic subcutaneous emphysema, initial encounter: Secondary | ICD-10-CM | POA: Diagnosis not present

## 2024-04-29 DIAGNOSIS — J188 Other pneumonia, unspecified organism: Secondary | ICD-10-CM | POA: Diagnosis not present

## 2024-04-29 DIAGNOSIS — J441 Chronic obstructive pulmonary disease with (acute) exacerbation: Secondary | ICD-10-CM | POA: Diagnosis not present

## 2024-04-29 LAB — CBC
HCT: 35.8 % — ABNORMAL LOW (ref 39.0–52.0)
Hemoglobin: 11.3 g/dL — ABNORMAL LOW (ref 13.0–17.0)
MCH: 30 pg (ref 26.0–34.0)
MCHC: 31.6 g/dL (ref 30.0–36.0)
MCV: 95 fL (ref 80.0–100.0)
Platelets: 91 K/uL — ABNORMAL LOW (ref 150–400)
RBC: 3.77 MIL/uL — ABNORMAL LOW (ref 4.22–5.81)
RDW: 18.1 % — ABNORMAL HIGH (ref 11.5–15.5)
WBC: 10.9 K/uL — ABNORMAL HIGH (ref 4.0–10.5)
nRBC: 0 % (ref 0.0–0.2)

## 2024-04-29 LAB — GLUCOSE, CAPILLARY
Glucose-Capillary: 89 mg/dL (ref 70–99)
Glucose-Capillary: 200 mg/dL — ABNORMAL HIGH (ref 70–99)
Glucose-Capillary: 224 mg/dL — ABNORMAL HIGH (ref 70–99)
Glucose-Capillary: 194 mg/dL — ABNORMAL HIGH (ref 70–99)

## 2024-04-29 LAB — COMPREHENSIVE METABOLIC PANEL WITH GFR
ALT: 70 U/L — ABNORMAL HIGH (ref 0–44)
AST: 74 U/L — ABNORMAL HIGH (ref 15–41)
Albumin: 2.1 g/dL — ABNORMAL LOW (ref 3.5–5.0)
Alkaline Phosphatase: 783 U/L — ABNORMAL HIGH (ref 38–126)
Anion gap: 17 — ABNORMAL HIGH (ref 5–15)
BUN: 13 mg/dL (ref 8–23)
CO2: 28 mmol/L (ref 22–32)
Calcium: 8.3 mg/dL — ABNORMAL LOW (ref 8.9–10.3)
Chloride: 90 mmol/L — ABNORMAL LOW (ref 98–111)
Creatinine, Ser: 0.73 mg/dL (ref 0.61–1.24)
GFR, Estimated: >60 mL/min (ref >60)
Glucose, Bld: 90 mg/dL (ref 70–99)
Potassium: 3.5 mmol/L (ref 3.5–5.1)
Sodium: 135 mmol/L (ref 135–145)
Total Bilirubin: 1.4 mg/dL — ABNORMAL HIGH (ref 0.0–1.2)
Total Protein: 3.5 g/dL — ABNORMAL LOW (ref 6.5–8.1)

## 2024-04-29 MED ORDER — FUROSEMIDE 10 MG/ML IJ SOLN
40.0000 mg | Freq: Two times a day (BID) | INTRAMUSCULAR | Status: DC
  Administered 2024-04-29: 40 mg via INTRAVENOUS
  Filled 2024-04-29 (×2): qty 4

## 2024-04-29 MED ORDER — SULFAMETHOXAZOLE-TRIMETHOPRIM 400-80 MG PO TABS
1.0000 | ORAL_TABLET | ORAL | Status: DC
  Administered 2024-04-30 – 2024-05-09 (×5): 1 via ORAL
  Filled 2024-04-29 (×5): qty 1

## 2024-04-29 MED ORDER — POTASSIUM CHLORIDE CRYS ER 20 MEQ PO TBCR
40.0000 meq | EXTENDED_RELEASE_TABLET | Freq: Once | ORAL | Status: AC
  Administered 2024-04-29: 40 meq via ORAL
  Filled 2024-04-29: qty 2

## 2024-04-29 MED ORDER — PREDNISONE 20 MG PO TABS
40.0000 mg | ORAL_TABLET | Freq: Two times a day (BID) | ORAL | Status: DC
  Administered 2024-04-29 – 2024-05-01 (×4): 40 mg via ORAL
  Filled 2024-04-29 (×4): qty 2

## 2024-04-29 NOTE — TOC Progression Note (Signed)
 Transition of Care [facility]) - Progression Note    Patient Details  Name: Luis [name] MRN: [id number] Date of Birth: Nov 17, 1940  [facility] CM/SW Contact  [name], [name] Phone Number: 04/29/2024, 4:07 PM  Clinical Narrative:     Pt does not meet criteria for LTACH [facility] cannot offer bed Still waiting for decision from [facility].   CSW met with pt and provided current SNF bed offers. Explained that decision from [employer] [employer] is still pending.    Expected Discharge Plan: Skilled Nursing Facility Barriers to Discharge: Continued Medical Work up               Expected Discharge Plan and Services   Discharge Planning Services: CM Consult Post Acute Care Choice: NA Living arrangements for the past 2 months: Single Family Home                 DME Arranged:  (see note)         HH Arranged: NA           Social Drivers of Health (SDOH) Interventions SDOH Screenings   Food Insecurity: No Food Insecurity (04/07/2024)  Housing: Low Risk  (04/07/2024)  Transportation Needs: No Transportation Needs (04/07/2024)  Utilities: Not At Risk (04/07/2024)  Depression (PHQ2-9): Low Risk  (03/21/2024)  Social Connections: Socially Integrated (04/07/2024)  Tobacco Use: Medium Risk (04/07/2024)    Readmission Risk Interventions     No data to display

## 2024-04-29 NOTE — Progress Notes (Addendum)
 Progress Note  Patient Name: Luis [name] Date of Encounter: 04/29/2024  Primary Cardiologist: [name], MD   Subjective   Having a hard time taking a deep breath due to left chest wall pain.  Feeling little better today and sitting upright in the chair.  Wife at bedside.  Inpatient Medications    Scheduled Meds:  apixaban  5 mg Oral BID   benzonatate  100 mg Oral TID   budesonide -glycopyrrolate-formoterol  2 puff Inhalation BID   Chlorhexidine  Gluconate Cloth  6 each Topical Daily   dextromethorphan   30 mg Oral BID   docusate sodium   100 mg Oral BID   furosemide   40 mg Intravenous BID   gabapentin   200 mg Oral QHS   guaiFENesin   600 mg Oral BID   insulin aspart  0-15 Units Subcutaneous TID WC   levothyroxine   50 mcg Oral Q0600   midodrine  10 mg Oral TID WC   minocycline  100 mg Oral Daily   montelukast   10 mg Oral QHS   pantoprazole  40 mg Oral BID   predniSONE   15 mg Oral Q breakfast   sodium chloride  flush  10-40 mL Intracatheter Q12H   sucralfate  1 g Oral BID   Continuous Infusions:  PRN Meds: acetaminophen , guaiFENesin -dextromethorphan , ipratropium-albuterol , melatonin, ondansetron  (ZOFRAN ) IV, mouth rinse, phenol, polyethylene glycol, sodium chloride , sodium chloride  flush   Vital Signs    Vitals:   04/29/24 0647 04/29/24 0742 04/29/24 0750 04/29/24 1258  BP: (!) 154/59 (!) 112/54  (!) 143/68  Pulse:  87  89  Resp:  18 (!) 22 19  Temp: (!) 97.4 F (36.3 C) 97.6 F (36.4 C)  (!) 97.5 F (36.4 C)  TempSrc: Oral Oral  Oral  SpO2:  99%    Weight:      Height:        Intake/Output Summary (Last 24 hours) at 04/29/2024 1347 Last data filed at 04/29/2024 9346 Gross per 24 hour  Intake 10 ml  Output 850 ml  Net -840 ml   Filed Weights   04/26/24 0500 04/27/24 0420 04/28/24 0317  Weight: 87.3 kg 88.9 kg 90.3 kg    Telemetry    Normal sinus rhythm- Personally Reviewed  ECG    Normal sinus rhythm with first-degree AV block, RBBB, LAFB and  PACs- Personally Reviewed  Physical Exam   Physical Exam Vitals and nursing note reviewed.  Constitutional:      Appearance: Normal appearance.  HENT:     Head: Normocephalic and atraumatic.  Eyes:     Conjunctiva/sclera: Conjunctivae normal.  Cardiovascular:     Rate and Rhythm: Normal rate and regular rhythm.  Pulmonary:     Effort: Pulmonary effort is normal.     Breath sounds: Rales present.  Musculoskeletal:     Comments: Bilateral upper extremity edema in lower extremities with wrapping in place  Skin:    Coloration: Skin is not jaundiced or pale.  Neurological:     Mental Status: He is alert.      Labs    Chemistry Recent Labs  Lab 04/27/24 0510 04/28/24 0345 04/29/24 0724  NA 135 133* 135  K 3.8 3.7 3.5  CL 93* 93* 90*  CO2 32 32 28  GLUCOSE 86 85 90  BUN 19 15 13   CREATININE 0.87 0.75 0.73  CALCIUM  7.9* 8.1* 8.3*  PROT 4.4* 4.6* 3.5*  ALBUMIN 2.0* 2.1* 2.1*  AST 84* 87* 74*  ALT 76* 76* 70*  ALKPHOS 723*  786* 783*  BILITOT 1.6* 1.6* 1.4*  GFRNONAA >60 >60 >60  ANIONGAP 10 8 17*     Hematology Recent Labs  Lab 04/27/24 0510 04/28/24 0345 04/29/24 0724  WBC 9.2 9.9 10.9*  RBC 3.35* 3.59* 3.77*  HGB 10.0* 10.6* 11.3*  HCT 31.8* 34.2* 35.8*  MCV 94.9 95.3 95.0  MCH 29.9 29.5 30.0  MCHC 31.4 31.0 31.6  RDW 17.9* 17.9* 18.1*  PLT 76* 82* 91*    Cardiac EnzymesNo results for input(s): TROPONINI in the last 168 hours. No results for input(s): TROPIPOC in the last 168 hours.   BNPNo results for input(s): BNP, PROBNP in the last 168 hours.   DDimer No results for input(s): DDIMER in the last 168 hours.   Radiology    No results found.  Cardiac Studies   RHC/LHC in 04/2022 for DOE     Patent stents in the LAD and diagonal.  Prior diagonal angioplasty site also widely patent.   Previously placed Ramus stent of unknown type is  widely patent.   The left ventricular systolic function is normal.   LV end diastolic pressure is  normal.   The left ventricular ejection fraction is 55-65% by visual estimate.   There is no aortic valve stenosis.   Apical LAD with 70% lesion.  This is to distal and too small for PCI.  Not much myocardium supplied from this vessel.   Aortic saturation 94%, PA saturation 74%, PA pressure 40/14, mean PA pressure 23 mmHg, mean pulmonary capillary wedge pressure 15 mmHg, cardiac output 8.4 L/min, cardiac index 3.73.   Echocardiogram 03/2024 IMPRESSIONS     1. Left ventricular ejection fraction, by estimation, is 70 to 75%. The  left ventricle has hyperdynamic function. The left ventricle has no  regional wall motion abnormalities. Left ventricular diastolic parameters  are consistent with Grade I diastolic  dysfunction (impaired relaxation).   2. Right ventricular systolic function is normal. The right ventricular  size is normal.   3. The mitral valve is degenerative. No evidence of mitral valve  regurgitation.   4. Difficult assessment for aortic stenosis, poor LVOT Doppler  evaluation. Consider future dedicated aortic stenosis study as outpatient.  The aortic valve was not well visualized. Aortic valve regurgitation is  not visualized. Aortic valve sclerosis is  present, with no evidence of aortic valve stenosis.   5. The inferior vena cava is normal in size with greater than 50%  respiratory variability, suggesting right atrial pressure of 3 mmHg.   Patient Profile     83 year old male with past medical history of metastatic colon cancer and mets to lungs, liver, sarcoidosis, COPD, CAD s/p PCI, HFpEF, atrial flutter s/p cardioversion, hypertension, hyperlipidemia, hypothyroidism who presented with progressive worsening of shortness of breath x 1 week with chest congestion and blood-tinged sputum.  Cardiology consult 11/14 for evaluation of increased LFTs in the setting of Multaq .  Multaq  is since been discontinued.    Assessment & Plan   Assessment & Plan:   Acute on chronic  diastolic heart failure-remains volume overloaded and weight is trending up but orthostatic vitals have made it difficult for volume optimization. Will give lasix  40 mg IV x 2 today.  Strict intake and output and daily weights.  Patient will need to be very careful getting up and will likely need assistance to avoid orthostatic issues Acute COPD exacerbation with asthma and pulmonary sarcoidosis encouraged incentive spirometer Metastatic colon cancer agree with palliative care discussion.   Elevated troponin secondary to  demand ischemia Left chest wall pain, musculoskeletal following coughing Atrial flutter s/p cardioversion-currently off Multaq  due to elevated LFTs.  On Eliquis 5 mg twice daily Bifascicular block CAD s/p PCI to ramus, LAD and diagonal Hyperlipidemia statin and Zetia  held for LFTs Orthostatic hypotension-continue midodrine  Time spent coordinating care: 38  minutes     For questions or updates, please contact [facility] Please consult [email or web address] for contact info under        Signed, Emeline Calender, DO 04/29/2024, 1:47 PM

## 2024-04-29 NOTE — Progress Notes (Signed)
 Occupational Therapy Treatment Patient Details Name: Luis [name] MRN: [id number] DOB: 1940-12-12 Today's Date: 04/29/2024   History of present illness 83 y.o. male [facility] to [facility] hospital on 04/07/2024 with progressive SOB. Pt underwent bronchoscopy with biopsies 10/31. Symptomatic orthostatic hypotension during PT session 11/7. Diuresis held 11/9 but resumed 11/10 due to ongoing high oxygenation needs. PMH includes metastatic colon CA with mets to lungs, liver, and lymph nodes, sarcoidosis, COPD, CAD, HFpEF, HTN, HLD, hypothyroidism.   OT comments  Pt was agreeable to session but needed increase in time and cues on pacing self. Pt throughout the session was educated about placing pillow/blanket for a splint against L side chest as per pt reporting 'they feel like they strain something when coughing. Pt's goal was to get OOB to chair and was able to complete bed mobility with CGA, sit to stand with step pivot to chair with min assist.  Pt worked on IS at the end session as pt reporting he was not able to use the day prior. At this time recommendation to look into [facility] with SNF vs SNF.   BP: Supine: 133/75 (91) Sitting: 102/61 (74) Sitting Repeat: 126/75 (92) Post transfer: 108/61 (75) Sitting 3 mins: 129/72 (84)      If plan is discharge home, recommend the following:  A little help with walking and/or transfers;A little help with bathing/dressing/bathroom;Assistance with cooking/housework;Assist for transportation   Equipment Recommendations  BSC/3in1;Tub/shower seat (wc vs transport chair)    Recommendations for Other Services      Precautions / Restrictions Precautions Precautions: Fall Recall of Precautions/Restrictions: Intact Precaution/Restrictions Comments: orthostatic 11/13, DOE with minimal activity Restrictions Weight Bearing Restrictions Per Provider Order: No       Mobility Bed Mobility Overal bed mobility: Needs Assistance Bed Mobility: Supine to Sit      Supine to sit: Contact guard, HOB elevated, Used rails     General bed mobility comments: needed increase in time to complete and asked about going to R side but wanted to go to the L side    Transfers Overall transfer level: Needs assistance Equipment used: 1 person hand held assist Transfers: Sit to/from Stand Sit to Stand: Contact guard assist, Min assist Stand pivot transfers: Contact guard assist, Min assist   Step pivot transfers: Contact guard assist, Min assist, From elevated surface     General transfer comment: slightly elevated needed max increase in time for breathing prior to attempt     Balance Overall balance assessment: Needs assistance Sitting-balance support: Feet supported Sitting balance-Leahy Scale: Good Sitting balance - Comments: often likes to lean forward with SOB but encourage to bring shoulders back   Standing balance support: Bilateral upper extremity supported Standing balance-Leahy Scale: Poor Standing balance comment: reached over to chair to step pivot                           ADL either performed or assessed with clinical judgement   ADL Overall ADL's : Needs assistance/impaired Eating/Feeding: Independent;Sitting   Grooming: Wash/dry hands;Wash/dry face;Sitting;Set up   Upper Body Bathing: Set up;Sitting;Contact guard assist       Upper Body Dressing : Contact guard assist;Sitting                   Functional mobility during ADLs: Contact guard assist;Minimal assistance;Cueing for safety;Cueing for sequencing      Extremity/Trunk Assessment Upper Extremity Assessment Upper Extremity Assessment: Overall WFL for tasks assessed (limited due to  pain in LUE at times)   Lower Extremity Assessment Lower Extremity Assessment: Defer to PT evaluation        Vision       Perception     Praxis     Communication Communication Communication: No apparent difficulties   Cognition Arousal: Alert Behavior During  Therapy: WFL for tasks assessed/performed, Impulsive Cognition: No apparent impairments                               Following commands: Intact        Cueing   Cueing Techniques: Verbal cues, Visual cues, Gestural cues  Exercises      Shoulder Instructions       General Comments Pt on 3L HFNC and went down to 86% while sitting at EOB but was able to recover with increase in time    Pertinent Vitals/ Pain       Pain Assessment Pain Assessment: Faces Faces Pain Scale: Hurts even more Pain Location: L chest area due to coughing Pain Descriptors / Indicators: Discomfort, Grimacing, Guarding, Sore Pain Intervention(s): Limited activity within patient's tolerance, Monitored during session, Repositioned, Premedicated before session  Home Living                                          Prior Functioning/Environment              Frequency  Min 2X/week        Progress Toward Goals  OT Goals(current goals can now be found in the care plan section)  Progress towards OT goals: Progressing toward goals  Acute Rehab OT Goals OT Goal Formulation: With patient Time For Goal Achievement: 05/06/24 Potential to Achieve Goals: Good ADL Goals Pt Will Perform Lower Body Bathing: with adaptive equipment;with set-up Pt Will Perform Lower Body Dressing: with min assist;with adaptive equipment Pt Will Transfer to Toilet: with supervision Pt Will Perform Toileting - Clothing Manipulation and hygiene: with supervision Additional ADL Goal #1: Pt able to return demonstration of EC strategies during ADL routine with supervision.  Plan      Co-evaluation                 AM-PAC OT 6 Clicks Daily Activity     Outcome Measure   Help from another person eating meals?: None Help from another person taking care of personal grooming?: A Little Help from another person toileting, which includes using toliet, bedpan, or urinal?: A Lot Help from  another person bathing (including washing, rinsing, drying)?: A Lot Help from another person to put on and taking off regular upper body clothing?: A Little Help from another person to put on and taking off regular lower body clothing?: A Lot 6 Click Score: 16    End of Session    OT Visit Diagnosis: Other abnormalities of gait and mobility (R26.89);Unsteadiness on feet (R26.81);Muscle weakness (generalized) (M62.81);Pain Pain - Right/Left: Left Pain - part of body:  (chest)   Activity Tolerance Patient tolerated treatment well   Patient Left in chair;with call bell/phone within reach;with chair alarm set;with family/visitor present   Nurse Communication Mobility status        Time: 0930-1002 OT Time Calculation (min): 32 min  Charges: OT General Charges $OT Visit: 1 Visit OT Treatments $Self Care/Home Management : 23-37 mins  Luis [name] K OTR/L  Acute Rehab Services  [phone] office number   Luis [name] 04/29/2024, 10:06 AM

## 2024-04-29 NOTE — NC FL2 (Signed)
 [place]  MEDICAID FL2 LEVEL OF CARE FORM     IDENTIFICATION  Patient Name: NEEV MCMAINS Birthdate: 30-Aug-1940 Sex: male Admission Date (Current Location): 04/07/2024  [facility] and [place] Number:  Producer, Television/film/video and Address:  [facility] [place], [address] [place], [name] [address]      Provider Number: [id number]  Attending Physician Name and Address:  Noralee Elidia Sieving,*  Relative Name and Phone Number:  Effie Linden (Spouse)  [phone] (Mobile)    Current Level of Care: Hospital Recommended Level of Care: Skilled Nursing Facility Prior Approval Number: [id number]  Date Approved/Denied:   PASRR Number:    Discharge Plan: SNF    Current Diagnoses: Patient Active Problem List   Diagnosis Date Noted   Acute on chronic diastolic CHF (congestive heart failure) (HCC) 04/28/2024   Hypokalemia 04/28/2024   Atrial flutter (HCC) 04/28/2024   Chronic anemia 04/28/2024   Type 2 diabetes mellitus with hyperlipidemia (HCC) 04/28/2024   Multifocal pneumonia 04/12/2024   Immunocompromised 04/11/2024   Acute hypoxic respiratory failure (HCC) 04/07/2024   Typical atrial flutter (HCC) 02/07/2024   Acute hypoxemic respiratory failure (HCC) 08/25/2023   Acute respiratory failure with hypoxia (HCC) 08/25/2023   Heart failure with reduced ejection fraction (HCC) 08/25/2023   Colon cancer (HCC) 08/25/2023   Hypothyroidism 08/25/2023   Acute exacerbation of COPD with asthma (HCC) 08/28/2022   Pleural effusion 11/28/2021   Fall 05/28/2021   S/P thoracentesis    Hematuria 07/28/2020   Adenocarcinoma (HCC)    Smoker 02/04/2020   Malignant neoplasm metastatic to liver (HCC) 08/26/2019   Port-A-Cath in place 03/26/2019   Goals of care, counseling/discussion 02/25/2019   Carotid stenosis 01/07/2019   Cecal cancer (HCC) 07/26/2017   Adenocarcinoma of cecum (HCC) 06/21/2017   Iron deficiency anemia due to chronic blood loss 06/21/2017   DOE  (dyspnea on exertion) 03/30/2015   Obesity (BMI 30-39.9) 03/30/2015   Trifascicular block    Chest pain 02/24/2015   CAD (coronary artery disease) 08/13/2014   Essential hypertension 08/13/2014   Hyperlipidemia 08/13/2014   Pulmonary fibrosis (HCC) 08/13/2014   Sarcoidosis    Syncope 01/29/2012    Orientation RESPIRATION BLADDER Height & Weight     Self, Time, Situation, Place  O2 (3L nasal cannula) Continent Weight: 199 lb 1.2 oz (90.3 kg) Height:  5' 11 (180.3 cm)  BEHAVIORAL SYMPTOMS/MOOD NEUROLOGICAL BOWEL NUTRITION STATUS      Continent Diet (see d/c summary)  AMBULATORY STATUS COMMUNICATION OF NEEDS Skin   Extensive Assist Verbally PU Stage and Appropriate Care (pressure injury head lower)                       Personal Care Assistance Level of Assistance  Bathing, Feeding, Dressing Bathing Assistance: Maximum assistance Feeding assistance: Independent Dressing Assistance: Maximum assistance     Functional Limitations Info  Sight, Hearing, Speech Sight Info: Impaired Hearing Info: Impaired Speech Info: Adequate    SPECIAL CARE FACTORS FREQUENCY  OT (By licensed OT), PT (By licensed PT)     PT Frequency: 5x/week OT Frequency: 5x/week            Contractures Contractures Info: Not present    Additional Factors Info  Code Status, Allergies Code Status Info: full code Allergies Info: losartan  potassium           Current Medications (04/29/2024):  This is the current hospital active medication list Current Facility-Administered Medications  Medication Dose Route Frequency Provider Last  Rate Last Admin   acetaminophen  (TYLENOL ) tablet 1,000 mg  1,000 mg Oral Q6H PRN Segars, Jonathan, MD   1,000 mg at 04/29/24 0833   apixaban (ELIQUIS) tablet 5 mg  5 mg Oral BID Patel, Pranav M, MD   5 mg at 04/29/24 0834   benzonatate (TESSALON) capsule 100 mg  100 mg Oral TID Patel, Pranav M, MD   100 mg at 04/29/24 9165   budesonide -glycopyrrolate-formoterol  (BREZTRI ) 160-9-4.8 MCG/ACT inhaler 2 puff  2 puff Inhalation BID Segars, Jonathan, MD   2 puff at 04/29/24 0739   Chlorhexidine  Gluconate Cloth 2 % PADS 6 each  6 each Topical Daily Kc, Mennie, MD   6 each at 04/27/24 1100   dextromethorphan  (DELSYM ) 30 MG/5ML liquid 30 mg  30 mg Oral BID Patel, Pranav M, MD   30 mg at 04/28/24 2249   docusate sodium  (COLACE) capsule 100 mg  100 mg Oral BID Christobal Mennie, MD   100 mg at 04/29/24 9163   furosemide  (LASIX ) tablet 20 mg  20 mg Oral Daily Amin, Sumayya, MD   20 mg at 04/29/24 9166   gabapentin  (NEURONTIN ) capsule 200 mg  200 mg Oral QHS Segars, Jonathan, MD   200 mg at 04/28/24 2212   guaiFENesin  (MUCINEX ) 12 hr tablet 600 mg  600 mg Oral BID Segars, Jonathan, MD   600 mg at 04/29/24 9166   guaiFENesin -dextromethorphan  (ROBITUSSIN DM) 100-10 MG/5ML syrup 5 mL  5 mL Oral Q4H PRN Von Bellis, MD   5 mL at 04/29/24 9372   insulin aspart (novoLOG) injection 0-15 Units  0-15 Units Subcutaneous TID WC Reome, Earle J, RPH   3 Units at 04/28/24 1834   ipratropium-albuterol  (DUONEB) 0.5-2.5 (3) MG/3ML nebulizer solution 3 mL  3 mL Nebulization Q6H PRN Amin, Sumayya, MD   3 mL at 04/28/24 0910   levothyroxine  (SYNTHROID ) tablet 50 mcg  50 mcg Oral Q0600 Segars, Jonathan, MD   50 mcg at 04/29/24 9372   melatonin tablet 6 mg  6 mg Oral QHS PRN Segars, Jonathan, MD   6 mg at 04/27/24 2002   midodrine (PROAMATINE) tablet 10 mg  10 mg Oral TID WC Amin, Sumayya, MD   10 mg at 04/29/24 9167   minocycline (MINOCIN) capsule 100 mg  100 mg Oral Daily Kc, Mennie, MD   100 mg at 04/29/24 0843   montelukast  (SINGULAIR ) tablet 10 mg  10 mg Oral QHS Segars, Jonathan, MD   10 mg at 04/28/24 2212   ondansetron  (ZOFRAN ) injection 4 mg  4 mg Intravenous Q6H PRN Keturah Carrier, MD   4 mg at 04/11/24 1304   Oral care mouth rinse  15 mL Mouth Rinse PRN Kc, Mennie, MD       pantoprazole (PROTONIX) EC tablet 40 mg  40 mg Oral BID Gretel Prentice BIRCH, RPH   40 mg at 04/29/24 0834   phenol  (CHLORASEPTIC) mouth spray 1 spray  1 spray Mouth/Throat PRN Daniels, James K, NP   1 spray at 04/29/24 0837   polyethylene glycol (MIRALAX / GLYCOLAX) packet 17 g  17 g Oral Daily PRN Segars, Jonathan, MD       predniSONE  (DELTASONE ) tablet 15 mg  15 mg Oral Q breakfast Sigdel, Santosh, MD   15 mg at 04/29/24 9166   sodium chloride  (OCEAN) 0.65 % nasal spray 1 spray  1 spray Each Nare PRN Christobal Mennie, MD   1 spray at 04/11/24 1105   sodium chloride  flush (NS) 0.9 % injection  10-40 mL  10-40 mL Intracatheter Q12H Kc, Ramesh, MD   10 mL at 04/28/24 2213   sodium chloride  flush (NS) 0.9 % injection 10-40 mL  10-40 mL Intracatheter PRN Kc, Mennie, MD       sucralfate (CARAFATE) 1 GM/10ML suspension 1 g  1 g Oral BID Patel, Pranav M, MD   1 g at 04/29/24 9167     Discharge Medications: Please see discharge summary for a list of discharge medications.  Relevant Imaging Results:  Relevant Lab Results:   Additional Information SSN [id number], [name]

## 2024-04-29 NOTE — TOC Progression Note (Signed)
 Transition of Care [facility]) - Progression Note    Patient Details  Name: Luis [name] MRN: [id number] Date of Birth: Apr 12, 1941  [facility] CM/SW Contact  [name], [name] Phone Number: 04/29/2024, 9:39 AM  Clinical Narrative:     CSW called pt's wife to discuss SNF w/u. Explained RNCM is having LTACH review if pt may be appropriate. She is agreeable to SNF if pt does not meet criteria for LTACH. She has preferences for [employer] or [employer]. Fl2 completed and bed requests sent in hub.   Expected Discharge Plan: Home/Self Care Barriers to Discharge: Continued Medical Work up               Expected Discharge Plan and Services   Discharge Planning Services: CM Consult Post Acute Care Choice: NA Living arrangements for the past 2 months: Single Family Home                 DME Arranged:  (see note)         HH Arranged: NA           Social Drivers of Health (SDOH) Interventions SDOH Screenings   Food Insecurity: No Food Insecurity (04/07/2024)  Housing: Low Risk  (04/07/2024)  Transportation Needs: No Transportation Needs (04/07/2024)  Utilities: Not At Risk (04/07/2024)  Depression (PHQ2-9): Low Risk  (03/21/2024)  Social Connections: Socially Integrated (04/07/2024)  Tobacco Use: Medium Risk (04/07/2024)    Readmission Risk Interventions     No data to display

## 2024-04-29 NOTE — Progress Notes (Signed)
 Patient ID: Luis [name], male   DOB: [birth year], 83 y.o.   MRN: [id number]    Progress Note from the Palliative Medicine Team at [facility]   Patient Name: Luis [name]        Date: 04/29/2024 DOB: September 15, 1940  Age: 83 y.o. MRN#: [id number] Attending Physician: Noralee Elidia Toribio DEWAINE Primary Care Physician: Luis Charlene CROME, MD Admit Date: 04/07/2024   Reason for Consultation/Follow-up   Establishing Goals of Care   HPI/ Brief Hospital Review  83 y.o. male   admitted on 04/07/2024 PMH of metastatic colon cancer with metasteses to lungs/ liver, sarcoidosis, COPD, CAD s/p PCI, HFpEF, aflutter s/p cardioversion, hypertension, HLD, hypothyroidism that presented with progressive worsening of SOB for a week with chest congestion and blood tinged sputum production.    Admitted for treatment and stabilization. Projected hospital stay, day 22 of this hosptial stay.  Patient and family working closely with TOC for next steps in transition of care.     Patient and family face treatment option decisions, advanced directive decisions and anticipatory care needs.   Subjective  Extensive chart review has been completed prior to meeting with patient/family  including labs, vital signs, imaging, progress/consult notes, orders, medications and available advance directive documents.   This NP assessed patient at the bedside as a follow up for palliative medicine needs an emotional support, wife at bedside.          Patient is dyspneic on minimal exertion. He reports periods of coughing through that night that wipe him out'.  Education offered on the utilization of low-dose opioids for cough and dyspnea  with underlying lung disease.  Patient does not like to use opioids as they make him foggy  Ongoing education regarding significance of his multiple comorbidities and high risk for decline over time.  Education offered today regarding  the importance of continued conversation with his  wife and the  medical providers regarding overall plan of care and treatment options,  ensuring decisions are within the context of the patients values and GOCs.  Questions and concerns addressed   Discussed with primary team and nursing staff   Time:  35 minutes  Detailed review of medical records ( labs, imaging, vital signs), medically appropriate exam ( MS, skin, cardiac,  resp)   discussed with treatment team, counseling and education to patient, family, staff, documenting clinical information, medication management, coordination of care    Ronal Plants NP  Palliative Medicine Team Team Phone # [phone] Pager [phone]

## 2024-04-29 NOTE — Progress Notes (Addendum)
 Progress Note   Patient: Luis [name] FMW:[id number] DOB: 29-Mar-1941 DOA: 04/07/2024     22 DOS: the patient was seen and examined on 04/29/2024   Brief hospital course: Luis [name] who presented with COPD exacerbation and decompensated heart failure.   83 y.o. male with PMH of metastatic colon cancer, mets to lungs, liver, LN's, sarcoidosis, COPD, coronary artery disease, heart failure, hypertension, hyperlipidemia, hypothyroidism, who presented with progressive shortness of breath onset x 1 wk with exertional dyspnea, chest congestion, minimally productive cough with clear and scant blood-tinged sputum,   S/P bronchoscopy BAL 10/31 - BAL culture-no growth to date AFB fungus PJP DFA- pending.  Cytology-atypical cells. Continue with prednisone  taper.  11/13: Mildly elevated blood pressure, still significant transaminitis-message sent to Dr. Clancy cardiologist regarding Multaq . Had 1 episode of bleeding while having bowel movement-history of hemorrhoid.  11/14: Hemodynamically stable, hemoglobin with some decreased 11.5, improving creatinine.  Becoming little more short of breath and hypoxic when stood for standing weight.  Giving 1 dose of IV Lasix  at 20 mg and restarting home Lasix . Pending formal cardiology consult regarding the use of Multaq .  11/15: Blood pressure started trending up so holding midodrine and placed parameters.  Multaq  was discontinued by cardiology and if he goes back in a flutter, he will need DCCV. 11/17 patient very weak and deconditioned  11/18 continue to have dyspnea.    Assessment and Plan: * Acute exacerbation of COPD with asthma (HCC) Pulmonary sarcoidosis.  Pneumomediastinum   Plan to continue bronchodilator therapy, inhaled and systemic corticosteroids.  Continue airway clearing techniques with flutter valve and incentive spirometer.  Out of bed to chair PT and Ot  Will resume systemic steroids, will need PJP prophylaxis with bactrim   Acute on  chronic hypoxemic respiratory failure. 11/18 follow up chest radiograph with bilateral basal and left upper lobe interstitial infiltrates, stable to improved compared to prior.   02 saturation today is 99% on 3 L/min per Dennard   Acute on chronic diastolic CHF (congestive heart failure) (HCC) Echocardiogram with preserved LV systolic function with EF 70 to 74%, grade 1 diastolic dysfunction with impaired relaxation. RV systolic function preserved, LA and RA with normal size, no significant valvular disease.   Continue diuresis with furosemide  40 mg IV bid.  Limited medical therapy due to risk of hypotension   LFT trending down, possible congestive hepatopathy.   CAD (coronary artery disease) No active chest pain, ruled out acute coronary syndrome.  High sensitive troponin elevation due to heart failure and respiratory failure.   Atrial flutter (HCC) Continue rate control and anticoagulation Continue telemetry monitoring   Essential hypertension Continue blood pressure monitoring  Blood pressure support with midodrine   Hypokalemia Renal function today with serum cr at 0,73 with k at 3,5 and serum bicarbonate at 28  Na 135   Plan to continue diuresis IV and will add po Kcl to prevent hypokalemia.  Follow up renal function and electrolytes in am.   Chronic anemia Follow up hgb stable Chronic thrombocytopenia   Type 2 diabetes mellitus with hyperlipidemia (HCC) Continue glucose cover and monitoring with insulin sliding scale   Hypothyroidism Continue levothyroxine   Colon cancer (HCC) Follow up as outpatient         Subjective: Patient continue to have dyspnea and generalized weakness, no chest pain, no PND or orthopnea   Physical Exam: Vitals:   04/29/24 0647 04/29/24 0742 04/29/24 0750 04/29/24 1258  BP: (!) 154/59 (!) 112/54  (!) 143/68  Pulse:  87  89  Resp:  18 (!) 22 19  Temp: (!) 97.4 F (36.3 C) 97.6 F (36.4 C)  (!) 97.5 F (36.4 C)  TempSrc: Oral Oral   Oral  SpO2:  99%    Weight:      Height:       Neurology awake and alert, deconditioned ENT with mild pallor Cardiovascular with S1 and S2 present and regular with no gallops or rubs, no murmurs Respiratory with prolonged expiratory phase and decreased ventilation, no wheezing or rhonchi  Abdomen with no distention  No lower extremity edema   Data Reviewed:    Family Communication: no family at the bedside   Disposition: Status is: Inpatient Remains inpatient appropriate because: respiratory failure   Planned Discharge Destination: Skilled nursing facility   Author: Elidia Toribio Furnace, MD 04/29/2024 4:24 PM  For on call review [email or web address].

## 2024-04-29 NOTE — TOC Progression Note (Signed)
 [facility]) - Progression Note    Patient Details  Name: Luis [name] MRN: [id number] Date of Birth: Apr 19, 1941  [facility] CM/SW Contact  [name], [name] Phone Number: 04/29/2024, 9:37 AM  Clinical Narrative:     CSW met with pt to discuss PT recs. Explained SNF w/u process and medicare coverage. States his spouse his hopeful for [facility] for facility though otherwise is agreeable to SNF w/u. Requests CSW to discuss with his wife regarding preferences.   Expected Discharge Plan: Home/Self Care Barriers to Discharge: Continued Medical Work up, need snf bed               Expected Discharge Plan and Services   Discharge Planning Services: CM Consult Post Acute Care Choice: NA Living arrangements for the past 2 months: Single Family Home                 DME Arranged:  (see note)         HH Arranged: NA           Social Drivers of Health (SDOH) Interventions SDOH Screenings   Food Insecurity: No Food Insecurity (04/07/2024)  Housing: Low Risk  (04/07/2024)  Transportation Needs: No Transportation Needs (04/07/2024)  Utilities: Not At Risk (04/07/2024)  Depression (PHQ2-9): Low Risk  (03/21/2024)  Social Connections: Socially Integrated (04/07/2024)  Tobacco Use: Medium Risk (04/07/2024)    Readmission Risk Interventions     No data to display

## 2024-04-30 DIAGNOSIS — I5033 Acute on chronic diastolic (congestive) heart failure: Secondary | ICD-10-CM | POA: Diagnosis not present

## 2024-04-30 DIAGNOSIS — R531 Weakness: Secondary | ICD-10-CM

## 2024-04-30 DIAGNOSIS — J441 Chronic obstructive pulmonary disease with (acute) exacerbation: Secondary | ICD-10-CM | POA: Diagnosis not present

## 2024-04-30 DIAGNOSIS — J188 Other pneumonia, unspecified organism: Secondary | ICD-10-CM | POA: Diagnosis not present

## 2024-04-30 DIAGNOSIS — Z515 Encounter for palliative care: Secondary | ICD-10-CM | POA: Diagnosis not present

## 2024-04-30 LAB — BASIC METABOLIC PANEL WITH GFR
Anion gap: 11 (ref 5–15)
BUN: 15 mg/dL (ref 8–23)
CO2: 32 mmol/L (ref 22–32)
Calcium: 8.2 mg/dL — ABNORMAL LOW (ref 8.9–10.3)
Chloride: 93 mmol/L — ABNORMAL LOW (ref 98–111)
Creatinine, Ser: 0.84 mg/dL (ref 0.61–1.24)
GFR, Estimated: >60 mL/min (ref >60)
Glucose, Bld: 146 mg/dL — ABNORMAL HIGH (ref 70–99)
Potassium: 4.4 mmol/L (ref 3.5–5.1)
Sodium: 136 mmol/L (ref 135–145)

## 2024-04-30 LAB — MAGNESIUM: Magnesium: 1.7 mg/dL (ref 1.7–2.4)

## 2024-04-30 LAB — GLUCOSE, CAPILLARY
Glucose-Capillary: 158 mg/dL — ABNORMAL HIGH (ref 70–99)
Glucose-Capillary: 186 mg/dL — ABNORMAL HIGH (ref 70–99)
Glucose-Capillary: 232 mg/dL — ABNORMAL HIGH (ref 70–99)
Glucose-Capillary: 162 mg/dL — ABNORMAL HIGH (ref 70–99)

## 2024-04-30 MED ORDER — FUROSEMIDE 10 MG/ML IJ SOLN
40.0000 mg | Freq: Two times a day (BID) | INTRAMUSCULAR | Status: DC
  Administered 2024-04-30 – 2024-05-01 (×3): 40 mg via INTRAVENOUS
  Filled 2024-04-30 (×2): qty 4

## 2024-04-30 NOTE — Plan of Care (Signed)

## 2024-04-30 NOTE — Progress Notes (Signed)
 Mobility Specialist Progress Note:    04/30/24 1038  Orthostatic Lying   BP- Lying 124/77 (89)  Orthostatic Sitting  BP- Sitting 106/72 (84)  Orthostatic Standing at 0 minutes  BP- Standing at 0 minutes (!) 76/61 (68)  Orthostatic Standing at 3 minutes  BP- Standing at 3 minutes 147/83 (103)  Mobility  Activity Stood at bedside;Dangled on edge of bed;Turned to back - supine;Mechanically lifted from bed to chair  Level of Assistance Minimal assist, patient does 75% or more  Assistive Device None  Distance Ambulated (ft) 3 ft  Activity Response Tolerated well  Mobility Referral Yes  Mobility visit 1 Mobility  Mobility Specialist Start Time (ACUTE ONLY) 1038  Mobility Specialist Stop Time (ACUTE ONLY) 1053  Mobility Specialist Time Calculation (min) (ACUTE ONLY) 15 min   Pt eager for mobility session. Eager to ambulate. Took BP and O2 vitals throughout session. See Below for O2 sats. When standing for chair transfer, pt reported dizziness. One episode of LOB when standing, Anterior leaning corrected with MinA+2. Pt sitting in chair, resolved dizziness. Final BP 147/83 (103). Left with all needs met. Wife in room. Eager for PT session later today.   Supine: SpO2 98% on 3L Dangle EOB: SpO2 90% on 3L Standing 1 min: SpO2 83% on 3L Standing 3 min: SpO2 90% on 8L Sitting in chair : SpO2 95% on 3L   Minh Jasper Mobility Specialist Please contact via Special Educational Needs Teacher or  Rehab office at [phone]

## 2024-04-30 NOTE — Progress Notes (Signed)
 Patient ID: CONSTANTINE RUDDICK, male   DOB: 1940-12-02, 83 y.o.   MRN: [id number]    Progress Note from the Palliative Medicine Team at [facility]   Patient Name: Luis [name]        Date: 04/30/2024 DOB: June 22, 1940  Age: 83 y.o. MRN#: [id number] Attending Physician: Danton Reyes DASEN, MD Primary Care Physician: [name], MD Admit Date: 04/07/2024   Reason for Consultation/Follow-up   Establishing Goals of Care   HPI/ Brief Hospital Review  83 y.o. male   admitted on 04/07/2024 PMH of metastatic colon cancer with metasteses to lungs/ liver, sarcoidosis, COPD, CAD s/p PCI, HFpEF, aflutter s/p cardioversion, hypertension, HLD, hypothyroidism that presented with progressive worsening of SOB for a week with chest congestion and blood tinged sputum production.    Admitted for treatment and stabilization.   Patient and family continue to work closely with TOC for next steps in transition of care.     Patient and family face treatment option decisions, advanced directive decisions and anticipatory care needs.   Subjective  Extensive chart review has been completed prior to meeting with patient/family  including labs, vital signs, imaging, progress/consult notes, orders, medications and available advance directive documents.   This NP assessed patient at the bedside as a follow up for palliative medicine needs an emotional support, and to meet as scheduled with wife for ongoing conversation regarding current medical situation, treatment option decisions, advanced directive decisions and anticipatory care needs.       Patient mains dyspneic on minimal exertion.  He tells me is trying hard to work with physical therapy and is motivated to do basic mobility exercises on his own from the bed.    He and his wife continue to work with [facility] for disposition to SNF for short-term rehab, recommend outpatient palliative medicine services at facility.  Education provided on importance of  advance care planning, particularly in light of his multiple comorbidities.  Discussion included realistic review of best case and worst-case scenarios to support informed decision making and future care aligns with his values and goals.  Ongoing education regarding significance of his multiple comorbidities and high risk for decline over time.    It is difficult for Mr. Nawaz to discuss concept specific to mortality and the possibility that medical interventions may not prolong the quality of life that he is looking for.   He tells me that he is not afraid of dying  Patient expresses strong optimism and a positive outlook, believing that his medical situation will improve into the near future.  He is open to all offered and available medical interventions to prolong life.  He is hopeful for SNF for short-term rehabilitation.  Ultimately patient hopes to return home with his wife   Patient is able to clearly express to his wife that in the event he becomes dependent on machines or aggressive medical interventions without meaningful quality of life, he would prefer to be allowed a more comfort focused approach.  Education offered today regarding  the importance of continued conversation with his wife and the  medical providers regarding overall plan of care and treatment options,  ensuring decisions are within the context of the patients values and GOCs.  Questions and concerns addressed   Discussed with primary team and nursing staff   Time:  50 minutes  Detailed review of medical records ( labs, imaging, vital signs), medically appropriate exam ( MS, skin, cardiac,  resp)   discussed with treatment team, counseling  and education to patient, family, staff, documenting clinical information, medication management, coordination of care    Ronal Plants NP  Palliative Medicine Team Team Phone # [phone] Pager [phone]

## 2024-04-30 NOTE — Progress Notes (Signed)
 Luis [name]  FMW:[id number] DOB: 11-Jun-1941 DOA: 04/07/2024 PCP: [name], MD    Brief Narrative:  83 year old with a history of metastatic colon cancer to the lungs liver and lymph nodes followed at [facility] since 2019, pulmonary sarcoidosis on chronic steroids, COPD, CAD, chronic diastolic CHF, HTN, HLD, and hypothyroidism who was admitted to the hospital 10/27 after presenting with progressive onset of worsening shortness of breath x 1 week.  Significant Events: 10/31 S/P bronchoscopy BAL culture-no growth to date AFB fungus PJP DFA- pending. Cytology-atypical cells. Continue with prednisone  taper. 11/13: Mildly elevated blood pressure, still significant transaminitis-message sent to Dr. Clancy cardiologist regarding Multaq . Had 1 episode of bleeding while having bowel movement-history of hemorrhoid. 11/14: Hemodynamically stable, hemoglobin with some decreased 11.5, improving creatinine.  Becoming little more short of breath and hypoxic when stood for standing weight.  Giving 1 dose of IV Lasix  at 20 mg and restarting home Lasix . Pending formal cardiology consult regarding the use of Multaq . 11/15: Blood pressure started trending up so holding midodrine and placed parameters.  Multaq  was discontinued by cardiology and if he goes back in a flutter, he will need DCCV. 11/17 patient very weak and deconditioned  11/18 continue to have dyspnea.   Goals of Care:   Code Status: Prior   DVT prophylaxis: Place TED hose Start: 04/19/24 0741 apixaban (ELIQUIS) tablet 5 mg   Interim Hx: Afebrile.  Vital signs stable.  Resting comfortably in a bedside chair at the time of my visit.  States that he is still short of breath compared to his baseline but better than admission.  Denies chest pain nausea vomiting or abdominal pain.  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 Continue systemic steroid tx with plan for slow prolonged prednisone  taper as per pulmonary [Prednisone  30 mg daily for 7 days, Prednisone  20 mg daily for 7 days, Prednisone  15 mg daily for 7 days, Prednisone  10 mg daily for 7 days, Prednisone  5 mg daily for 7 days, then stop]   Pneumomediastinum Spontaneous and minimal without need for acute intervention  Acute on chronic diastolic CHF exacerbation TTE noted EF 70-74% with grade 1 DD -continue twice daily Lasix  -limited medical therapy due to risk of hypotension  CAD Asymptomatic  Chronic atrial flutter Continue anticoagulation - Multaq  discontinued by cardiology - if RVR recurs will need DCCV  HTN Has required midodrine for blood pressure support during this admission  Hypokalemia Due to use of diuretic - supplement and follow  Chronic anemia Hemoglobin stable at present  DM2 Reasonably well-controlled at present  Hyperlipidemia Continue usual medical therapy  Hypothyroidism Continue usual levothyroxine  dose  Colon cancer Appears stable at present    Family Communication: No family present at time of visit Disposition:   Skilled Nursing-Short Term Rehab (<3 Hours/Day)04/28/2024 1200  Objective: Blood pressure (!) 146/82, pulse 93, temperature (!) 97.4 F (36.3 C), temperature source Oral, resp. rate (!) 22, height 5' 11 (1.803 m), weight 89.7 kg, SpO2 97%.  Intake/Output Summary (Last 24 hours) at 04/30/2024 0955 Last data filed at 04/30/2024 [id number] Gross  per 24 hour  Intake 10 ml   Output 1325 ml  Net -1315 ml   Filed Weights   04/27/24 0420 04/28/24 0317 04/30/24 0500  Weight: 88.9 kg 90.3 kg 89.7 kg    Examination: General: No acute respiratory distress Lungs: Clear to auscultation bilaterally without wheezes or crackles Cardiovascular: Regular rate and rhythm without murmur gallop or rub normal S1 and S2 Abdomen: Nontender, nondistended, soft, bowel sounds positive, no rebound, no ascites, no appreciable mass Extremities: No significant cyanosis, clubbing, or edema bilateral lower extremities  CBC: Recent Labs  Lab 04/24/24 0420 04/25/24 0316 04/26/24 1000 04/27/24 0510 04/28/24 0345 04/29/24 0724  WBC 14.2* 11.8*   < > 9.2 9.9 10.9*  NEUTROABS 12.2* 10.7*  --   --   --   --   HGB 12.5* 11.5*   < > 10.0* 10.6* 11.3*  HCT 38.9* 35.2*   < > 31.8* 34.2* 35.8*  MCV 93.5 92.4   < > 94.9 95.3 95.0  PLT 144* 112*   < > 76* 82* 91*   < > = values in this interval not displayed.   Basic Metabolic Panel: Recent Labs  Lab 04/25/24 0316 04/26/24 1000 04/28/24 0345 04/29/24 0724 04/30/24 0417  NA 132*   < > 133* 135 136  K 3.8   < > 3.7 3.5 4.4  CL 89*   < > 93* 90* 93*  CO2 28   < > 32 28 32  GLUCOSE 97   < > 85 90 146*  BUN 28*   < > 15 13 15   CREATININE 1.24   < > 0.75 0.73 0.84  CALCIUM  8.3*   < > 8.1* 8.3* 8.2*  MG 1.9  --  1.6*  --  1.7  PHOS  --   --  3.0  --   --    < > = values in this interval not displayed.   GFR: Estimated Creatinine Clearance: 71 mL/min (by C-G formula based on SCr of 0.84 mg/dL).   Scheduled Meds:  apixaban   5 mg Oral BID   benzonatate   100 mg Oral TID   budesonide -glycopyrrolate -formoterol   2 puff Inhalation BID   Chlorhexidine  Gluconate Cloth  6 each Topical Daily   dextromethorphan   30 mg Oral BID   docusate sodium   100 mg Oral BID   furosemide   40 mg Intravenous BID   gabapentin   200 mg Oral QHS   guaiFENesin   600 mg Oral BID   insulin  aspart  0-15 Units Subcutaneous TID WC   levothyroxine   50 mcg  Oral Q0600   midodrine   10 mg Oral TID WC   minocycline   100 mg Oral Daily   montelukast   10 mg Oral QHS   pantoprazole   40 mg Oral BID   predniSONE   40 mg Oral BID WC   sodium chloride  flush  10-40 mL Intracatheter Q12H   sucralfate   1 g Oral BID   sulfamethoxazole -trimethoprim   1 tablet Oral Once per day on Monday Wednesday Friday      LOS: 23 days   Reyes IVAR Moores, MD [facility] Office  [phone] Pager - Text Page per Tracey  If 7PM-7AM, please contact night-coverage per Amion 04/30/2024, 9:55 AM

## 2024-04-30 NOTE — Progress Notes (Addendum)
 Physical Therapy Treatment Patient Details Name: Luis [name] MRN: [id number] DOB: May 31, 1941 Today's Date: 04/30/2024   History of Present Illness 83 y.o. male presents to [facility] hospital on 04/07/2024 with progressive SOB. Pt underwent bronchoscopy with biopsies 10/31. Symptomatic orthostatic hypotension during PT session 11/7. Diuresis held 11/9 but resumed 11/10 due to ongoing high oxygenation needs. PMH includes metastatic colon CA with mets to lungs, liver, and lymph nodes, sarcoidosis, COPD, CAD, HFpEF, HTN, HLD, hypothyroidism.    PT Comments  Pt received in chair, alert and agreeable to therapy session, pt seen ~30 mins after receiving midodrine , with slightly improved BP stability compared with when he got up earlier in the day. Pt still with symptomatic orthostatic hypotension this session, recommend trial of abdominal binder when pt standing with therapies to see if it helps with BP stability, pt does not need to wear binder in chair at rest or in bed, RN/MD notified. Pt needing up to minA +2 for safety with short distance gait trial near chair, and needed increased supplemental O2 to 6L/min to maintain SpO2 ~88% and above. Pt remains on 4L O2 HF [place] in chair at end of session due to increase WOB with RR >20 rpm at rest. Patient will benefit from continued inpatient follow up therapy, <3 hours/day at [facility] or SNF setting. Orthostatic Lying   BP- Lying 134/76 ((93) recliner with HOB ~30 deg)  Pulse- Lying 70  Orthostatic Sitting  BP- Sitting 130/74 ((91))  Pulse- Sitting 84  Orthostatic Standing at 0 minutes  BP- Standing at 0 minutes 105/50  Pulse- Standing at 0 minutes 94     If plan is discharge home, recommend the following: Assistance with cooking/housework;Assist for transportation;Help with stairs or ramp for entrance;A little help with walking and/or transfers;A little help with bathing/dressing/bathroom   Can travel by private vehicle     No  Equipment Recommendations   Other (comment);Wheelchair cushion (measurements PT);Wheelchair (measurements PT);BSC/3in1 (pending progress; pt has rollator at home, also likely to need supplemental O2)    Recommendations for Other Services       Precautions / Restrictions Precautions Precautions: Fall Recall of Precautions/Restrictions: Intact Precaution/Restrictions Comments: orthostatic with standing despite LE compression, recommend abd binder donned (only don when standing up) Restrictions Weight Bearing Restrictions Per Provider Order: No     Mobility  Bed Mobility Overal bed mobility: Needs Assistance             General bed mobility comments: pt received in chair    Transfers Overall transfer level: Needs assistance Equipment used: Rolling walker (2 wheels) Transfers: Sit to/from Stand Sit to Stand: Contact guard assist, Min assist           General transfer comment: CGA to supervision for sit>stand, up to light minA for stand>sit as he fatigues and SpO2/BP drop    Ambulation/Gait Ambulation/Gait assistance: Min assist, +2 physical assistance, +2 safety/equipment Gait Distance (Feet): 15 Feet Assistive device: Rolling walker (2 wheels) Gait Pattern/deviations: Step-to pattern, Trunk flexed, Shuffle, Decreased stride length, Step-through pattern Gait velocity: reduced Gait velocity interpretation: <1.31 ft/sec, indicative of household ambulator   General Gait Details: SPTA defer longer distance due to pt c/o weakness in LEs and SOB. Forward/backward ~77ft x3 reps, pt needs assist for RW proximity/mgmt and close chair follow for safety. SpO2 drop to ~86% on 6L O2 [place] as he fatigues, pt tending to mouth breathe despite cues, but once seated, pt SpO2 rebounds to >92% within 90 seconds on 6L/min. pt weaned back to  4L/min wall O2 once sats recovered, but kept on 4L/min due to elevated RR at rest.   Stairs             Wheelchair Mobility     Tilt Bed    Modified Rankin (Stroke  Patients Only)       Balance Overall balance assessment: Needs assistance Sitting-balance support: Feet supported Sitting balance-Leahy Scale: Good Sitting balance - Comments: often likes to lean forward with SOB but encourage to bring shoulders back   Standing balance support: Bilateral upper extremity supported Standing balance-Leahy Scale: Poor Standing balance comment: needing +2 for safety for dynamic standing tasks at RW                            Communication Communication Communication: No apparent difficulties Factors Affecting Communication: Hearing impaired (better with hearing aids donned)  Cognition Arousal: Alert Behavior During Therapy: WFL for tasks assessed/performed   PT - Cognitive impairments: Sequencing, Safety/Judgement                       PT - Cognition Comments: Increased processing time needed while pt standing; he does better with advance discussion of session plan and increased cues while standing due to orthostatic symptoms. Following commands: Intact      Cueing Cueing Techniques: Verbal cues, Gestural cues  Exercises Other Exercises Other Exercises: discussed with pt and spouse 10-15 reps 3x/day seated and supine LE exercises per previous handout. Also encouraged IS x10 reps hourly while awake in bed/chair.    General Comments General comments (skin integrity, edema, etc.): SpO2 desat to 80% on 3L/min O2 HF [place] with standing, and does not improve to >88% until he is turned up to 6L/min. RN/MD notified. pt decreased to 4L/min O2 HF [place] at rest.      Pertinent Vitals/Pain Pain Assessment Pain Assessment: Faces Faces Pain Scale: Hurts a little bit Pain Location: coughing related muscle pain Pain Descriptors / Indicators: Discomfort, Guarding, Sore Pain Intervention(s): Limited activity within patient's tolerance, Monitored during session, Repositioned    Home Living                          Prior Function             PT Goals (current goals can now be found in the care plan section) Acute Rehab PT Goals Patient Stated Goal: Improve activity tolerance and be able to ambulate. PT Goal Formulation: With patient/family Time For Goal Achievement: 05/06/24 Progress towards PT goals: Progressing toward goals    Frequency    Min 2X/week      PT Plan      Co-evaluation              AM-PAC PT 6 Clicks Mobility   Outcome Measure  Help needed turning from your back to your side while in a flat bed without using bedrails?: A Little Help needed moving from lying on your back to sitting on the side of a flat bed without using bedrails?: A Little Help needed moving to and from a bed to a chair (including a wheelchair)?: A Lot (+2 to pivot due to BP) Help needed standing up from a chair using your arms (e.g., wheelchair or bedside chair)?: A Little Help needed to walk in hospital room?: Total (<20 ft) Help needed climbing 3-5 steps with a railing? : Total 6 Click Score: 13  End of Session Equipment Utilized During Treatment: Gait belt;Oxygen Activity Tolerance: Patient tolerated treatment well;Treatment limited secondary to medical complications (Comment);Other (comment) (DOE and hypoxia as well as orthostatic hypotension limiting standing tolerance) Patient left: in chair;with call bell/phone within reach;with chair alarm set;with family/visitor present;Other (comment) (pt provided with padded leg rest stool so he can partially elevate his legs, pt reports recliner setting is too high for comfort) Nurse Communication: Mobility status;Precautions;Other (comment) (BP, O2 needs with activity; pt needs abdominal binder to trial with standing for better BP stability) PT Visit Diagnosis: Other abnormalities of gait and mobility (R26.89);Difficulty in walking, not elsewhere classified (R26.2);Muscle weakness (generalized) (M62.81)     Time: 8682-8656 PT Time Calculation (min) (ACUTE ONLY):  26 min  Charges:    $Gait Training: 8-22 mins $Therapeutic Activity: 8-22 mins PT General Charges $$ ACUTE PT VISIT: 1 Visit                     Cyndie Woodbeck P., PTA Acute Rehabilitation Services Secure Chat Preferred 9a-5:30pm Office: [phone]    Connell HERO [facility] 04/30/2024, 3:05 PM

## 2024-04-30 NOTE — Progress Notes (Addendum)
 This chaplain responded to PMT [facility] consult for creating the Pt. Living Will. The chaplain understands the Pt. has designated his wife-Juanita as his healthcare agent. At the time of the visit, the Pt. is awake and accepting of the visit. Juanita is visiting at the bedside.  The chaplain completed AD: Living Will education with the Pt. and Juanita. The chaplain will return on Thursday before 12:30 pm for completion of the Living Will.  The chaplain updated the Pt. on his Code Status in Epic. The Pt. Code Status is Prior. The Pt. is interested in clarifying his code status. The chaplain updated the Charge RN-Bryan on the unit. The chaplain understands RN-Bryan will update Dr. Danton.  ** 1440 This chaplain scanned the Pt. HCPOA from home into the Pt. EMR.  This chaplain is available for F/U spiritual care as needed.  Chaplain Leeroy Hummer [phone]

## 2024-04-30 NOTE — Progress Notes (Signed)
 Progress Note  Patient Name: Luis [name] Date of Encounter: 04/30/2024  Primary Cardiologist: [name], MD   Subjective   Feels a bit better today after getting diuresed. Also less chest discomfort. Not ambulating much except to the chair and bed.   Inpatient Medications    Scheduled Meds:  apixaban  5 mg Oral BID   benzonatate  100 mg Oral TID   budesonide -glycopyrrolate-formoterol  2 puff Inhalation BID   Chlorhexidine  Gluconate Cloth  6 each Topical Daily   dextromethorphan   30 mg Oral BID   docusate sodium   100 mg Oral BID   furosemide   40 mg Intravenous BID   gabapentin   200 mg Oral QHS   guaiFENesin   600 mg Oral BID   insulin aspart  0-15 Units Subcutaneous TID WC   levothyroxine   50 mcg Oral Q0600   midodrine  10 mg Oral TID WC   minocycline  100 mg Oral Daily   montelukast   10 mg Oral QHS   pantoprazole  40 mg Oral BID   predniSONE   40 mg Oral BID WC   sodium chloride  flush  10-40 mL Intracatheter Q12H   sucralfate  1 g Oral BID   sulfamethoxazole-trimethoprim  1 tablet Oral Once per day on Monday Wednesday Friday   Continuous Infusions:  PRN Meds: acetaminophen , guaiFENesin -dextromethorphan , ipratropium-albuterol , melatonin, ondansetron  (ZOFRAN ) IV, mouth rinse, phenol, polyethylene glycol, sodium chloride , sodium chloride  flush   Vital Signs    Vitals:   04/30/24 0300 04/30/24 0325 04/30/24 0500 04/30/24 0728  BP:  (!) 151/73  (!) 146/82  Pulse: (!) 37 66  83  Resp: 17 17  (!) 24  Temp:  (!) 97.5 F (36.4 C)  (!) 97.4 F (36.3 C)  TempSrc:  Oral  Oral  SpO2: 100% 100%  100%  Weight:   89.7 kg   Height:        Intake/Output Summary (Last 24 hours) at 04/30/2024 0820 Last data filed at 04/30/2024 9364 Gross per 24 hour  Intake 10 ml  Output 1325 ml  Net -1315 ml   Filed Weights   04/27/24 0420 04/28/24 0317 04/30/24 0500  Weight: 88.9 kg 90.3 kg 89.7 kg    Telemetry    Normal sinus rhythm- Personally Reviewed  ECG    Normal  sinus rhythm with first-degree AV block, RBBB, LAFB and PACs- Personally Reviewed  Physical Exam   Physical Exam Vitals and nursing note reviewed.  Constitutional:      Appearance: Normal appearance.  HENT:     Head: Normocephalic and atraumatic.  Eyes:     Conjunctiva/sclera: Conjunctivae normal.  Cardiovascular:     Rate and Rhythm: Normal rate and regular rhythm.  Pulmonary:     Effort: Pulmonary effort is normal.     Breath sounds: Rales present.  Skin:    Coloration: Skin is not jaundiced or pale.  Neurological:     Mental Status: He is alert.      Labs    Chemistry Recent Labs  Lab 04/27/24 0510 04/28/24 0345 04/29/24 0724 04/30/24 0417  NA 135 133* 135 136  K 3.8 3.7 3.5 4.4  CL 93* 93* 90* 93*  CO2 32 32 28 32  GLUCOSE 86 85 90 146*  BUN 19 15 13 15   CREATININE 0.87 0.75 0.73 0.84  CALCIUM  7.9* 8.1* 8.3* 8.2*  PROT 4.4* 4.6* 3.5*  --   ALBUMIN 2.0* 2.1* 2.1*  --   AST 84* 87* 74*  --   ALT  76* 76* 70*  --   ALKPHOS 723* 786* 783*  --   BILITOT 1.6* 1.6* 1.4*  --   GFRNONAA >60 >60 >60 >60  ANIONGAP 10 8 17* 11     Hematology Recent Labs  Lab 04/27/24 0510 04/28/24 0345 04/29/24 0724  WBC 9.2 9.9 10.9*  RBC 3.35* 3.59* 3.77*  HGB 10.0* 10.6* 11.3*  HCT 31.8* 34.2* 35.8*  MCV 94.9 95.3 95.0  MCH 29.9 29.5 30.0  MCHC 31.4 31.0 31.6  RDW 17.9* 17.9* 18.1*  PLT 76* 82* 91*    Cardiac EnzymesNo results for input(s): TROPONINI in the last 168 hours. No results for input(s): TROPIPOC in the last 168 hours.   BNPNo results for input(s): BNP, PROBNP in the last 168 hours.   DDimer No results for input(s): DDIMER in the last 168 hours.   Radiology    DG Chest 1 View Result Date: 04/29/2024 EXAM: 1 VIEW(S) XRAY OF THE CHEST 04/29/2024 01:47:00 PM COMPARISON: 04/22/2024 CLINICAL HISTORY: Follow-up exam [id number] FINDINGS: LINES, TUBES AND DEVICES: Accessed right chest port in place. LUNGS AND PLEURA: Postsurgical changes of right lower  lobe. Bilateral calcified pulmonary nodules. Calcified mediastinal and hilar lymph nodes. Stable patchy left lung airspace opacities. Right bases airspace opacity. Small bilateral pleural effusions. No pneumothorax. HEART AND MEDIASTINUM: Aortic calcification present. Calcified mediastinal lymph nodes. No acute abnormality of the cardiac silhouette. BONES AND SOFT TISSUES: Decreased left supraclavicular soft tissue emphysema. No acute osseous abnormality. IMPRESSION: 1. Right basilar and left lung airspace opacities, unchanged from prior exam. 2. Small bilateral pleural effusions. Electronically signed by: Morgane Naveau MD 04/29/2024 05:34 PM EST RP Workstation: [id number]    Cardiac Studies   RHC/LHC in 04/2022 for DOE     Patent stents in the LAD and diagonal.  Prior diagonal angioplasty site also widely patent.   Previously placed Ramus stent of unknown type is  widely patent.   The left ventricular systolic function is normal.   LV end diastolic pressure is normal.   The left ventricular ejection fraction is 55-65% by visual estimate.   There is no aortic valve stenosis.   Apical LAD with 70% lesion.  This is to distal and too small for PCI.  Not much myocardium supplied from this vessel.   Aortic saturation 94%, PA saturation 74%, PA pressure 40/14, mean PA pressure 23 mmHg, mean pulmonary capillary wedge pressure 15 mmHg, cardiac output 8.4 L/min, cardiac index 3.73.   Echocardiogram 03/2024 IMPRESSIONS     1. Left ventricular ejection fraction, by estimation, is 70 to 75%. The  left ventricle has hyperdynamic function. The left ventricle has no  regional wall motion abnormalities. Left ventricular diastolic parameters  are consistent with Grade I diastolic  dysfunction (impaired relaxation).   2. Right ventricular systolic function is normal. The right ventricular  size is normal.   3. The mitral valve is degenerative. No evidence of mitral valve  regurgitation.   4. Difficult  assessment for aortic stenosis, poor LVOT Doppler  evaluation. Consider future dedicated aortic stenosis study as outpatient.  The aortic valve was not well visualized. Aortic valve regurgitation is  not visualized. Aortic valve sclerosis is  present, with no evidence of aortic valve stenosis.   5. The inferior vena cava is normal in size with greater than 50%  respiratory variability, suggesting right atrial pressure of 3 mmHg.   Patient Profile     83 year old male with past medical history of metastatic colon cancer and mets to lungs, liver,  sarcoidosis, COPD, CAD s/p PCI, HFpEF, atrial flutter s/p cardioversion, hypertension, hyperlipidemia, hypothyroidism who presented with progressive worsening of shortness of breath x 1 week with chest congestion and blood-tinged sputum.  Cardiology consult 11/14 for evaluation of increased LFTs in the setting of Multaq .  Multaq  is since been discontinued.    Assessment & Plan   Assessment & Plan:   Acute on chronic diastolic heart failure-remains volume overloaded and weight is trending up but orthostatic vitals have made it difficult for volume optimization.  Good output with IV Lasix .  Continue with lasix  40 mg IV twice daily. Strict intake and output and daily weights.  Patient will need to be very careful getting up and will likely need assistance to avoid orthostatic issues Acute COPD exacerbation with asthma and pulmonary sarcoidosis encouraged incentive spirometer Metastatic colon cancer agree with palliative care discussion.   Elevated troponin secondary to demand ischemia Left chest wall pain, musculoskeletal following coughing Atrial flutter s/p cardioversion-currently off Multaq  due to elevated LFTs.  On Eliquis 5 mg twice daily Bifascicular block CAD s/p PCI to ramus, LAD and diagonal Hyperlipidemia statin and Zetia  held for LFTs Orthostatic hypotension-continue midodrine  Time spent coordinating care: 35  minutes     For questions  or updates, please contact [facility] Please consult [email or web address] for contact info under        Signed, Emeline Calender, DO 04/30/2024, 8:20 AM

## 2024-05-01 ENCOUNTER — Other Ambulatory Visit: Payer: Self-pay

## 2024-05-01 ENCOUNTER — Inpatient Hospital Stay (HOSPITAL_COMMUNITY)

## 2024-05-01 DIAGNOSIS — J441 Chronic obstructive pulmonary disease with (acute) exacerbation: Secondary | ICD-10-CM | POA: Diagnosis not present

## 2024-05-01 DIAGNOSIS — R918 Other nonspecific abnormal finding of lung field: Secondary | ICD-10-CM | POA: Diagnosis not present

## 2024-05-01 DIAGNOSIS — I5033 Acute on chronic diastolic (congestive) heart failure: Secondary | ICD-10-CM | POA: Diagnosis not present

## 2024-05-01 DIAGNOSIS — R0602 Shortness of breath: Secondary | ICD-10-CM | POA: Diagnosis not present

## 2024-05-01 DIAGNOSIS — J90 Pleural effusion, not elsewhere classified: Secondary | ICD-10-CM | POA: Diagnosis not present

## 2024-05-01 DIAGNOSIS — Z4682 Encounter for fitting and adjustment of non-vascular catheter: Secondary | ICD-10-CM | POA: Diagnosis not present

## 2024-05-01 LAB — CBC
HCT: 35.9 % — ABNORMAL LOW (ref 39.0–52.0)
Hemoglobin: 11.3 g/dL — ABNORMAL LOW (ref 13.0–17.0)
MCH: 29.6 pg (ref 26.0–34.0)
MCHC: 31.5 g/dL (ref 30.0–36.0)
MCV: 94 fL (ref 80.0–100.0)
Platelets: 107 K/uL — ABNORMAL LOW (ref 150–400)
RBC: 3.82 MIL/uL — ABNORMAL LOW (ref 4.22–5.81)
RDW: 18.4 % — ABNORMAL HIGH (ref 11.5–15.5)
WBC: 10.3 K/uL (ref 4.0–10.5)
nRBC: 0 % (ref 0.0–0.2)

## 2024-05-01 LAB — COMPREHENSIVE METABOLIC PANEL WITH GFR
ALT: 70 U/L — ABNORMAL HIGH (ref 0–44)
AST: 76 U/L — ABNORMAL HIGH (ref 15–41)
Albumin: 2.2 g/dL — ABNORMAL LOW (ref 3.5–5.0)
Alkaline Phosphatase: 1027 U/L — ABNORMAL HIGH (ref 38–126)
Anion gap: 19 — ABNORMAL HIGH (ref 5–15)
BUN: 20 mg/dL (ref 8–23)
CO2: 28 mmol/L (ref 22–32)
Calcium: 8.6 mg/dL — ABNORMAL LOW (ref 8.9–10.3)
Chloride: 90 mmol/L — ABNORMAL LOW (ref 98–111)
Creatinine, Ser: 0.99 mg/dL (ref 0.61–1.24)
GFR, Estimated: >60 mL/min (ref >60)
Glucose, Bld: 135 mg/dL — ABNORMAL HIGH (ref 70–99)
Potassium: 4.2 mmol/L (ref 3.5–5.1)
Sodium: 137 mmol/L (ref 135–145)
Total Bilirubin: 1.3 mg/dL — ABNORMAL HIGH (ref 0.0–1.2)
Total Protein: 5 g/dL — ABNORMAL LOW (ref 6.5–8.1)

## 2024-05-01 LAB — GLUCOSE, CAPILLARY
Glucose-Capillary: 122 mg/dL — ABNORMAL HIGH (ref 70–99)
Glucose-Capillary: 157 mg/dL — ABNORMAL HIGH (ref 70–99)
Glucose-Capillary: 175 mg/dL — ABNORMAL HIGH (ref 70–99)
Glucose-Capillary: 159 mg/dL — ABNORMAL HIGH (ref 70–99)

## 2024-05-01 LAB — MAGNESIUM: Magnesium: 1.7 mg/dL (ref 1.7–2.4)

## 2024-05-01 MED ORDER — MIDODRINE HCL 5 MG PO TABS
5.0000 mg | ORAL_TABLET | Freq: Three times a day (TID) | ORAL | Status: DC
  Administered 2024-05-01 – 2024-05-03 (×5): 5 mg via ORAL
  Filled 2024-05-01 (×5): qty 1

## 2024-05-01 MED ORDER — FUROSEMIDE 10 MG/ML IJ SOLN
60.0000 mg | Freq: Two times a day (BID) | INTRAMUSCULAR | Status: DC
  Administered 2024-05-01 – 2024-05-06 (×10): 60 mg via INTRAVENOUS
  Filled 2024-05-01 (×10): qty 6

## 2024-05-01 MED ORDER — METOLAZONE 5 MG PO TABS
5.0000 mg | ORAL_TABLET | Freq: Once | ORAL | Status: AC
  Administered 2024-05-01: 5 mg via ORAL
  Filled 2024-05-01: qty 1

## 2024-05-01 MED ORDER — FUROSEMIDE 40 MG PO TABS: 40.0000 mg | ORAL_TABLET | Freq: Two times a day (BID) | ORAL | Status: DC

## 2024-05-01 MED ORDER — ACETAMINOPHEN 325 MG PO TABS
650.0000 mg | ORAL_TABLET | Freq: Four times a day (QID) | ORAL | Status: DC | PRN
  Administered 2024-05-02 – 2024-05-05 (×4): 650 mg via ORAL
  Filled 2024-05-01 (×4): qty 2

## 2024-05-01 MED ORDER — PREDNISONE 20 MG PO TABS
40.0000 mg | ORAL_TABLET | Freq: Every day | ORAL | Status: DC
  Administered 2024-05-02: 40 mg via ORAL
  Filled 2024-05-01: qty 2

## 2024-05-01 NOTE — Progress Notes (Signed)
 Rec'd order for heated HFNC d/t pt desaturating on NRB.  Pt placed on 35 lpm, 95% fio2 HHFNC.  Per pt this feels good, pt states he feels this is helping his breathing.  Currently pt is resting in bed, slight tachypnea but no resp distress noted currently.  Sat 100%.  BBSH w/ fine crackles in bases (left more than right).

## 2024-05-01 NOTE — Progress Notes (Signed)
 42- RN called d/t pt coughing and pt desaturating in mid 80's%, RN placed pt on NRB, sat now 94%.  Per pt- he states he feels a pill went down the wrong way earlier this morning.  Pt w/ intermittent coughing spells.  During my visit, pt used his flutter valve and did cough up a small plug which pt states this gave some relief.  RN in room during this eval and aware of above noted.    1055- BBSH clear diminished currently, pt was placed back on HFNC 15 lpm, sat 94%. RN in room and aware.  No distress noted, per pt he feels his breathing has improved from earlier episode.

## 2024-05-01 NOTE — Progress Notes (Signed)
 Occupational Therapy Treatment Patient Details Name: Luis [name] MRN: [id number] DOB: 1941-04-24 Today's Date: 05/01/2024   History of present illness 83 y.o. male presents to [facility] hospital on 04/07/2024 with progressive SOB. Pt underwent bronchoscopy with biopsies 10/31. Symptomatic orthostatic hypotension during PT session 11/7. Diuresis held 11/9 but resumed 11/10 due to ongoing high oxygenation needs. PMH includes metastatic colon CA with mets to lungs, liver, and lymph nodes, sarcoidosis, COPD, CAD, HFpEF, HTN, HLD, hypothyroidism.   OT comments  Patient up in recliner upon entry and agreeable to OT session. Patient performed grooming seated in recliner and required a rest break before completing. Patient was provided handout for energy conservation and reviewed with patient. BP check at sitting with 124/80 (92) and in standing 98/52 (67), no ab binder present at this time.  Patient will benefit from continued inpatient follow up therapy, <3 hours/day.  Acute OT to continue to follow to address established goals to facilitate DC to next venue of care.        If plan is discharge home, recommend the following:  A little help with walking and/or transfers;A little help with bathing/dressing/bathroom;Assistance with cooking/housework;Assist for transportation   Equipment Recommendations  BSC/3in1;Tub/shower seat (wc vs transport chair)    Recommendations for Other Services      Precautions / Restrictions Precautions Precautions: Fall Recall of Precautions/Restrictions: Intact Precaution/Restrictions Comments: orthostatic with standing despite LE compression, recommend abd binder donned (only don when standing up) Restrictions Weight Bearing Restrictions Per Provider Order: No       Mobility Bed Mobility Overal bed mobility: Needs Assistance             General bed mobility comments: OOB in recliner    Transfers Overall transfer level: Needs assistance Equipment used:  Rolling walker (2 wheels) Transfers: Sit to/from Stand Sit to Stand: Contact guard assist           General transfer comment: stood from recliner with CGA for sit to stand with limited standing tolerance     Balance Overall balance assessment: Needs assistance Sitting-balance support: Feet supported Sitting balance-Leahy Scale: Good Sitting balance - Comments: in recliner   Standing balance support: Bilateral upper extremity supported Standing balance-Leahy Scale: Poor Standing balance comment: BUE Support with RW                           ADL either performed or assessed with clinical judgement   ADL Overall ADL's : Needs assistance/impaired     Grooming: Wash/dry hands;Wash/dry face;Oral care;Set up;Sitting Grooming Details (indicate cue type and reason): required rest break before completing                               General ADL Comments: performed grooming and declined further self care due to fatigue and difficulty breathing    Extremity/Trunk Assessment              Vision       Perception     Praxis     Communication Communication Communication: No apparent difficulties Factors Affecting Communication: Hearing impaired (wears hearing aids)   Cognition Arousal: Alert Behavior During Therapy: WFL for tasks assessed/performed Cognition: No apparent impairments                               Following commands: Intact  Cueing   Cueing Techniques: Verbal cues, Gestural cues  Exercises      Shoulder Instructions       General Comments Energy conservation handout provided and reviewed with patient.  BP seated in recliner 124/80 (92) SpO2 97% on 4 liters, Standing BP 98/52 (67) SpO2 89%.  returned to recliner and asked for O2 to increase.  O2 increased to 6 liters with SpO2 at 92%    Pertinent Vitals/ Pain       Pain Assessment Pain Assessment: Faces Faces Pain Scale: Hurts a little bit Pain  Location: coughing related muscle pain Pain Descriptors / Indicators: Discomfort, Guarding, Sore Pain Intervention(s): Limited activity within patient's tolerance, Monitored during session, Repositioned  Home Living                                          Prior Functioning/Environment              Frequency  Min 2X/week        Progress Toward Goals  OT Goals(current goals can now be found in the care plan section)  Progress towards OT goals: Progressing toward goals  Acute Rehab OT Goals OT Goal Formulation: With patient Time For Goal Achievement: 05/06/24 Potential to Achieve Goals: Good ADL Goals Pt Will Perform Lower Body Bathing: with adaptive equipment;with set-up Pt Will Perform Lower Body Dressing: with min assist;with adaptive equipment Pt Will Transfer to Toilet: with supervision Pt Will Perform Toileting - Clothing Manipulation and hygiene: with supervision Additional ADL Goal #1: Pt able to return demonstration of EC strategies during ADL routine with supervision.  Plan      Co-evaluation                 AM-PAC OT 6 Clicks Daily Activity     Outcome Measure   Help from another person eating meals?: None Help from another person taking care of personal grooming?: A Little Help from another person toileting, which includes using toliet, bedpan, or urinal?: A Lot Help from another person bathing (including washing, rinsing, drying)?: A Lot Help from another person to put on and taking off regular upper body clothing?: A Little Help from another person to put on and taking off regular lower body clothing?: A Lot 6 Click Score: 16    End of Session Equipment Utilized During Treatment: Rolling walker (2 wheels);Oxygen  OT Visit Diagnosis: Other abnormalities of gait and mobility (R26.89);Unsteadiness on feet (R26.81);Muscle weakness (generalized) (M62.81);Pain   Activity Tolerance Patient tolerated treatment well;Patient limited  by fatigue   Patient Left in chair;with call bell/phone within reach;with chair alarm set;with family/visitor present   Nurse Communication Mobility status        Time: 9199-9169 OT Time Calculation (min): 30 min  Charges: OT General Charges $OT Visit: 1 Visit OT Treatments $Self Care/Home Management : 8-22 mins $Therapeutic Activity: 8-22 mins  Dick Laine, OTA Acute Rehabilitation Services  Office [phone]   Jeb LITTIE Laine 05/01/2024, 12:16 PM

## 2024-05-01 NOTE — Progress Notes (Signed)
 Pt called out endorsing his L UNNA boot was too tight and causing pain from his ankle to just below his knee. Pt kindly requested boot be removed. This RN took dressing down, and observed pt's shin with mild erythema. Upon removal pt endorsed almost complete relief of pain. Pt amenable to replacement of UNNA boot in AM. Both legs elevated.

## 2024-05-01 NOTE — Progress Notes (Signed)
 Patient is desatting with movement and transfers into the low 60's patient recovers within 30 mins but is symptomatic and has no reserve. MD and RT notified new orders  to transition to [facility] per RT recommendations

## 2024-05-01 NOTE — Progress Notes (Signed)
 This chaplain is present for F/U spiritual care with the Pt. and Pt. Wife-Juanita. Living Will  education was reviewed and the Pt. and Juanita are ready to proceed with the notarizing his Living Will.  This chaplain is present with the Pt., Juanita, notary, and witnesses for the notarizing of the Pt. Living Will. The Pt. previously completed HCPOA.  The Pt. gave the original Living Will to the Pt. along with one copy. The chaplain scanned the Pt. Living Will into the Pt. EMR.  This chaplain is available for F/U spiritual care as needed.  Chaplain Leeroy Hummer [phone]

## 2024-05-01 NOTE — TOC Progression Note (Addendum)
 Transition of Care [facility]) - Progression Note    Patient Details  Name: Luis [name] MRN: [id number] Date of Birth: [birth year]  Transition of Care [facility]) CM/SW Contact  Montie LOISE Louder, [name] Phone Number: 05/01/2024, 11:59 AM  Clinical Narrative:     TOC working on placement - per chart review patient was placed on HFNC 15 lpm - TOC following for medical readiness  SNFs referral pending: CSW sent message requesting SNFs to review for possible placement once stable:  [facility] , unable to offer  [facility] -can offer if no chemo tx and STR only  [facility]- waiting on response   [facility] unable to offer.    TOC will continue to follow and assist with discharge planning.  Montie Louder, MSW, LCSW Clinical Social Worker    Expected Discharge Plan: Skilled Nursing Facility Barriers to Discharge: Continued Medical Work up               Expected Discharge Plan and Services   Discharge Planning Services: CM Consult Post Acute Care Choice: NA Living arrangements for the past 2 months: Single Family Home                 DME Arranged:  (see note)         HH Arranged: NA           Social Drivers of Health (SDOH) Interventions SDOH Screenings   Food Insecurity: No Food Insecurity (04/07/2024)  Housing: Low Risk  (04/07/2024)  Transportation Needs: No Transportation Needs (04/07/2024)  Utilities: Not At Risk (04/07/2024)  Depression (PHQ2-9): Low Risk  (03/21/2024)  Social Connections: Socially Integrated (04/07/2024)  Tobacco Use: Medium Risk (04/07/2024)    Readmission Risk Interventions     No data to display

## 2024-05-01 NOTE — Progress Notes (Signed)
 Orthopedic Tech Progress Note Patient Details:  Luis [name] Mar 06, 1941 [id number]  Ortho Devices Type of Ortho Device: Radio broadcast assistant Ortho Device/Splint Location: Bilateral Ortho Device/Splint Interventions: Ordered, Application, Adjustment   Post Interventions Patient Tolerated: Fair Instructions Provided: Adjustment of device  Luis [name] [name] 05/01/2024, 6:53 PM

## 2024-05-01 NOTE — Progress Notes (Signed)
   05/01/24 1018  Mobility  Activity Respositioned in chair  Level of Assistance Standby assist, set-up cues, supervision of patient - no hands on  Assistive Device Front wheel walker  Activity Response Tolerated fair  Mobility Referral Yes  Mobility visit 1 Mobility  Mobility Specialist Start Time (ACUTE ONLY) 1018  Mobility Specialist Stop Time (ACUTE ONLY) 1032  Mobility Specialist Time Calculation (min) (ACUTE ONLY) 14 min   Mobility Specialist: Progress Note  Pre-Mobility:      HR 80s, SpO2 93% 15L, BP 147/88 (107) During Mobility:                SpO2 80-96% 15L Post-Mobility:    HR 100s, SpO2 93% 15L, BP 120/74 (86)  Pt agreeable to mobility session - received in chair. C/o difficult to catch breath, unable to stand d/t breathing. Returned to chair with all needs met - call bell within reach. Bed alarm on. Wife presents.   Additional comments: Pt stated her already stood up twice, once with PT. Initially agreeable to standing but unable to catch his breath.  Virgle Boards, BS Mobility Specialist Please contact via SecureChat or  Rehab office at [phone].

## 2024-05-01 NOTE — Progress Notes (Signed)
 Luis [name]  FMW:[id number] DOB: 1940-07-22 DOA: 04/07/2024 PCP: [name], MD    Brief Narrative:  83 year old with a history of metastatic colon cancer to the lungs liver and lymph nodes followed at [facility] since 2019, pulmonary sarcoidosis on chronic steroids, COPD, CAD, chronic diastolic CHF, HTN, HLD, and hypothyroidism who was admitted to the hospital 10/27 after presenting with progressive onset of worsening shortness of breath x 1 week.  Significant Events: 10/31 S/P bronchoscopy BAL culture no growth to date AFB fungus PJP DFA pending. Cytology atypical cells. Continue with prednisone  taper. 11/13: Mildly elevated blood pressure, still significant transaminitis-message sent to Dr. Clancy cardiologist regarding Multaq . Had 1 episode of bleeding while having bowel movement-history of hemorrhoid. 11/14: Hemodynamically stable, hemoglobin with some decreased 11.5, improving creatinine.  Becoming little more short of breath and hypoxic when stood for standing weight.  Giving 1 dose of IV Lasix  at 20 mg and restarting home Lasix . Pending formal cardiology consult regarding the use of Multaq . 11/15: Blood pressure started trending up so holding midodrine and placed parameters.  Multaq  was discontinued by cardiology and if he goes back in a flutter, he will need DCCV. 11/17 patient very weak and deconditioned  11/18 continue to have dyspnea.   Goals of Care:   Code Status: Full Code   DVT prophylaxis: Place TED hose Start: 04/19/24 0741 apixaban (ELIQUIS) tablet 5 mg   Interim Hx: The patient's R Unna boot had to be removed last night due to pain, which resolved immediately upon removal.  No other acute issues reported overnight.  Afebrile.  Vital signs stable.  He has suffered worsening of his respiratory symptoms today with severe desaturations with even minimal movement.  This has required resumption of HHFNC to which he has responded well.  At the time of my visit he is on  [facility] and appears comfortable with stable saturations at 95% or greater.  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 Continue systemic steroid tx with plan for slow prolonged prednisone  taper as per pulmonary [Prednisone  30 mg daily for 7 days, Prednisone  20 mg daily for 7 days, Prednisone  15 mg daily for 7 days, Prednisone  10 mg daily for 7 days, Prednisone  5 mg daily for 7 days, then stop]   Pneumomediastinum Spontaneous and minimal without need for acute intervention  Acute on chronic diastolic CHF exacerbation TTE noted EF 70-74% with grade 1 DD -continue twice daily Lasix  - limited medical therapy due to risk of hypotension/orthostasis - Cardiology following - net negative ~22L since admit - Cards adding metolazone today   Filed Weights   04/28/24 0317 04/30/24 0500 05/01/24 0500  Weight: 90.3 kg 89.7 kg 89.4 kg    CAD Asymptomatic  Chronic atrial flutter Continue anticoagulation - Multaq  discontinued by Cardiology - if RVR recurs will need DCCV  HTN Has required midodrine for blood pressure support during this admission - blood pressure presently normalized - attempt to wean off midodrine as BP tolerates   Hypokalemia Due to use of  diuretic - corrected with supplementation  Chronic anemia Hemoglobin stable at present  DM2 Reasonably well-controlled at present  Hyperlipidemia Continue usual medical  therapy  Hypothyroidism Continue usual levothyroxine  dose  Colon cancer Appears stable at present    Family Communication: No family present at time of visit Disposition:   Skilled Nursing-Short Term Rehab (<3 Hours/Day)04/30/2024 1400  Objective: Blood pressure 137/76, pulse (!) 101, temperature (!) 97.2 F (36.2 C), temperature source Axillary, resp. rate 20, height 5' 11 (1.803 m), weight 89.4 kg, SpO2 (!) 84%.  Intake/Output Summary (Last 24 hours) at 05/01/2024 0854 Last data filed at 05/01/2024 0841 Gross per 24 hour  Intake 490 ml  Output 950 ml  Net -460 ml   Filed Weights   04/28/24 0317 04/30/24 0500 05/01/24 0500  Weight: 90.3 kg 89.7 kg 89.4 kg    Examination: General: No acute respiratory distress Lungs: Clear to auscultation bilaterally without wheezes or crackles Cardiovascular: Regular rate and rhythm without murmur gallop or rub normal S1 and S2 Abdomen: Nontender, nondistended, soft, bowel sounds positive, no rebound, no ascites, no appreciable mass Extremities: No significant cyanosis, clubbing, or edema bilateral lower extremities  CBC: Recent Labs  Lab 04/25/24 0316 04/26/24 1000 04/28/24 0345 04/29/24 0724 05/01/24 0600  WBC 11.8*   < > 9.9 10.9* 10.3  NEUTROABS 10.7*  --   --   --   --   HGB 11.5*   < > 10.6* 11.3* 11.3*  HCT 35.2*   < > 34.2* 35.8* 35.9*  MCV 92.4   < > 95.3 95.0 94.0  PLT 112*   < > 82* 91* 107*   < > = values in this interval not displayed.   Basic Metabolic Panel: Recent Labs  Lab 04/28/24 0345 04/29/24 0724 04/30/24 0417 05/01/24 0600  NA 133* 135 136 137  K 3.7 3.5 4.4 4.2  CL 93* 90* 93* 90*  CO2 32 28 32 28  GLUCOSE 85 90 146* 135*  BUN 15 13 15 20   CREATININE 0.75 0.73 0.84 0.99  CALCIUM  8.1* 8.3* 8.2* 8.6*  MG 1.6*  --  1.7 1.7  PHOS 3.0  --   --   --    GFR: Estimated Creatinine Clearance: 60.2 mL/min (by C-G formula based on SCr of 0.99 mg/dL).   Scheduled Meds:  apixaban  5 mg Oral BID    benzonatate  100 mg Oral TID   budesonide -glycopyrrolate-formoterol  2 puff Inhalation BID   Chlorhexidine  Gluconate Cloth  6 each Topical Daily   dextromethorphan   30 mg Oral BID   docusate sodium   100 mg Oral BID   furosemide   40 mg Intravenous BID   gabapentin   200 mg Oral QHS   guaiFENesin   600 mg Oral BID   insulin aspart  0-15 Units Subcutaneous TID WC   levothyroxine   50 mcg Oral Q0600   midodrine  10 mg Oral TID WC   minocycline  100 mg Oral Daily   montelukast   10 mg Oral QHS   pantoprazole  40 mg Oral BID   predniSONE   40 mg Oral BID WC   sodium chloride  flush  10-40 mL Intracatheter Q12H   sucralfate  1 g Oral BID   sulfamethoxazole-trimethoprim  1 tablet Oral Once per day on Monday Wednesday Friday      LOS: 24 days   Reyes IVAR Moores, MD [facility] Office  [phone] Pager - Text Page per Tracey  If 7PM-7AM, please contact night-coverage per Amion 05/01/2024, 8:54 AM

## 2024-05-01 NOTE — TOC Progression Note (Signed)
 Transition of Care [facility]) - Progression Note    Patient Details  Name: Luis [name] MRN: [id number] Date of Birth: 1940/08/06  Transition of Care [facility]) CM/SW Contact  Whitfield Campanile, Student-Social Work Phone Number: 05/01/2024, 11:40 AM  Clinical Narrative:     11:35 AM: MSW Intern introduced self and role to patient and wife at bedside. Provided updated bed offers. Patient waiting to hear back from other SNFs, back up option is [facility].  CSW sent follow up to SNFs for decision.   TOC continuing to follow.  Whitfield Campanile, MSW Intern  Expected Discharge Plan: Skilled Nursing Facility Barriers to Discharge: Continued Medical Work up               Expected Discharge Plan and Services   Discharge Planning Services: CM Consult Post Acute Care Choice: NA Living arrangements for the past 2 months: Single Family Home                 DME Arranged:  (see note)         HH Arranged: NA           Social Drivers of Health (SDOH) Interventions SDOH Screenings   Food Insecurity: No Food Insecurity (04/07/2024)  Housing: Low Risk  (04/07/2024)  Transportation Needs: No Transportation Needs (04/07/2024)  Utilities: Not At Risk (04/07/2024)  Depression (PHQ2-9): Low Risk  (03/21/2024)  Social Connections: Socially Integrated (04/07/2024)  Tobacco Use: Medium Risk (04/07/2024)    Readmission Risk Interventions     No data to display

## 2024-05-01 NOTE — Progress Notes (Signed)
 Progress Note  Patient Name: Luis [name] Date of Encounter: 05/01/2024  Primary Cardiologist: [name], MD   Subjective   In respiratory distress today. Increased work of breathing and with a nonrebreather mask. Slept in the chair overnight.   Inpatient Medications    Scheduled Meds:  apixaban   5 mg Oral BID   benzonatate   100 mg Oral TID   budesonide -glycopyrrolate -formoterol   2 puff Inhalation BID   Chlorhexidine  Gluconate Cloth  6 each Topical Daily   dextromethorphan   30 mg Oral BID   docusate sodium   100 mg Oral BID   furosemide   60 mg Intravenous BID   gabapentin   200 mg Oral QHS   guaiFENesin   600 mg Oral BID   insulin  aspart  0-15 Units Subcutaneous TID WC   levothyroxine   50 mcg Oral Q0600   metolazone   5 mg Oral Once   midodrine   5 mg Oral TID WC   minocycline   100 mg Oral Daily   montelukast   10 mg Oral QHS   pantoprazole   40 mg Oral BID   [START ON 05/02/2024] predniSONE   40 mg Oral Q breakfast   sodium chloride  flush  10-40 mL Intracatheter Q12H   sucralfate   1 g Oral BID   sulfamethoxazole -trimethoprim   1 tablet Oral Once per day on Monday Wednesday Friday   Continuous Infusions:  PRN Meds: acetaminophen , guaiFENesin -dextromethorphan , ipratropium-albuterol , melatonin, ondansetron  (ZOFRAN ) IV, mouth rinse, phenol, polyethylene glycol, sodium chloride , sodium chloride  flush   Vital Signs    Vitals:   04/30/24 2305 05/01/24 0420 05/01/24 0500 05/01/24 0837  BP: 135/85 (!) 159/94  137/76  Pulse: 76 85  (!) 101  Resp: 20 20  20   Temp: 97.7 F (36.5 C) (!) 97.3 F (36.3 C)  (!) 97.2 F (36.2 C)  TempSrc: Oral Oral  Axillary  SpO2: 100% 100% 93% (!) 84%  Weight:   89.4 kg   Height:        Intake/Output Summary (Last 24 hours) at 05/01/2024 1014 Last data filed at 05/01/2024 0841 Gross per 24 hour  Intake 490 ml  Output 950 ml  Net -460 ml   Filed Weights   04/28/24 0317 04/30/24 0500 05/01/24 0500  Weight: 90.3 kg 89.7 kg 89.4 kg     Telemetry    Normal sinus rhythm- Personally Reviewed  ECG    Normal sinus rhythm with first-degree AV block, RBBB, LAFB and PACs- Personally Reviewed  Physical Exam   Physical Exam Vitals and nursing note reviewed.  Constitutional:      Appearance: He is ill-appearing.  HENT:     Head: Normocephalic and atraumatic.  Eyes:     Conjunctiva/sclera: Conjunctivae normal.  Cardiovascular:     Rate and Rhythm: Tachycardia present. Rhythm irregular.  Pulmonary:     Breath sounds: Rales present.     Comments: Increased work of breathing, non rebreather mask in place Diminished right breath sounds Bibasilar Rales Musculoskeletal:     Comments: 1+ bilateral lower extremity edema  Skin:    Coloration: Skin is not jaundiced or pale.  Neurological:     Mental Status: He is alert.      Labs    Chemistry Recent Labs  Lab 04/28/24 0345 04/29/24 0724 04/30/24 0417 05/01/24 0600  NA 133* 135 136 137  K 3.7 3.5 4.4 4.2  CL 93* 90* 93* 90*  CO2 32 28 32 28  GLUCOSE 85 90 146* 135*  BUN 15 13 15 20   CREATININE 0.75 0.73 0.84 0.99  CALCIUM  8.1* 8.3* 8.2* 8.6*  PROT 4.6* 3.5*  --  5.0*  ALBUMIN 2.1* 2.1*  --  2.2*  AST 87* 74*  --  76*  ALT 76* 70*  --  70*  ALKPHOS 786* 783*  --  1,027*  BILITOT 1.6* 1.4*  --  1.3*  GFRNONAA >60 >60 >60 >60  ANIONGAP 8 17* 11 19*     Hematology Recent Labs  Lab 04/28/24 0345 04/29/24 0724 05/01/24 0600  WBC 9.9 10.9* 10.3  RBC 3.59* 3.77* 3.82*  HGB 10.6* 11.3* 11.3*  HCT 34.2* 35.8* 35.9*  MCV 95.3 95.0 94.0  MCH 29.5 30.0 29.6  MCHC 31.0 31.6 31.5  RDW 17.9* 18.1* 18.4*  PLT 82* 91* 107*    Cardiac EnzymesNo results for input(s): TROPONINI in the last 168 hours. No results for input(s): TROPIPOC in the last 168 hours.   BNPNo results for input(s): BNP, PROBNP in the last 168 hours.   DDimer No results for input(s): DDIMER in the last 168 hours.   Radiology    DG Chest 1 View Result Date:  04/29/2024 EXAM: 1 VIEW(S) XRAY OF THE CHEST 04/29/2024 01:47:00 PM COMPARISON: 04/22/2024 CLINICAL HISTORY: Follow-up exam [id number] FINDINGS: LINES, TUBES AND DEVICES: Accessed right chest port in place. LUNGS AND PLEURA: Postsurgical changes of right lower lobe. Bilateral calcified pulmonary nodules. Calcified mediastinal and hilar lymph nodes. Stable patchy left lung airspace opacities. Right bases airspace opacity. Small bilateral pleural effusions. No pneumothorax. HEART AND MEDIASTINUM: Aortic calcification present. Calcified mediastinal lymph nodes. No acute abnormality of the cardiac silhouette. BONES AND SOFT TISSUES: Decreased left supraclavicular soft tissue emphysema. No acute osseous abnormality. IMPRESSION: 1. Right basilar and left lung airspace opacities, unchanged from prior exam. 2. Small bilateral pleural effusions. Electronically signed by: Morgane Naveau MD 04/29/2024 05:34 PM EST RP Workstation: [id number]    Cardiac Studies   RHC/LHC in 04/2022 for DOE     Patent stents in the LAD and diagonal.  Prior diagonal angioplasty site also widely patent.   Previously placed Ramus stent of unknown type is  widely patent.   The left ventricular systolic function is normal.   LV end diastolic pressure is normal.   The left ventricular ejection fraction is 55-65% by visual estimate.   There is no aortic valve stenosis.   Apical LAD with 70% lesion.  This is to distal and too small for PCI.  Not much myocardium supplied from this vessel.   Aortic saturation 94%, PA saturation 74%, PA pressure 40/14, mean PA pressure 23 mmHg, mean pulmonary capillary wedge pressure 15 mmHg, cardiac output 8.4 L/min, cardiac index 3.73.   Echocardiogram 03/2024 IMPRESSIONS     1. Left ventricular ejection fraction, by estimation, is 70 to 75%. The  left ventricle has hyperdynamic function. The left ventricle has no  regional wall motion abnormalities. Left ventricular diastolic parameters  are  consistent with Grade I diastolic  dysfunction (impaired relaxation).   2. Right ventricular systolic function is normal. The right ventricular  size is normal.   3. The mitral valve is degenerative. No evidence of mitral valve  regurgitation.   4. Difficult assessment for aortic stenosis, poor LVOT Doppler  evaluation. Consider future dedicated aortic stenosis study as outpatient.  The aortic valve was not well visualized. Aortic valve regurgitation is  not visualized. Aortic valve sclerosis is  present, with no evidence of aortic valve stenosis.   5. The inferior vena cava is normal in size with greater than 50%  respiratory  variability, suggesting right atrial pressure of 3 mmHg.   Patient Profile     83 year old male with past medical history of metastatic colon cancer and mets to lungs, liver, sarcoidosis, COPD, CAD s/p PCI, HFpEF, atrial flutter s/p cardioversion, hypertension, hyperlipidemia, hypothyroidism who presented with progressive worsening of shortness of breath x 1 week with chest congestion and blood-tinged sputum.  Cardiology consult 11/14 for evaluation of increased LFTs in the setting of Multaq .  Multaq  is since been discontinued.    Assessment & Plan   Assessment & Plan:   Acute hypoxic respiratory failure, multifactorial: acute bronchospastic COPD exacerbation, CAP, pulmonary sarcoidosis, pneumomediastinum and heart failure exacerbation worsened today. Right lung sounds diminished compared to left with rales. CXR. Acute on chronic diastolic heart failure-remains volume overloaded and weight is trending up but orthostatic vitals have made it difficult for volume optimization.  Poor output with IV lasix  overnight. Diuretics have been limited due to orthostatic hypotension but his volume status is worsened today and he is unable to get up from the chair. Change PO to 60 mg IV BID and give a dose of metolazone PO x 1. Strict intake and output and daily weights.  Patient will  need to be very careful getting up and will likely need assistance to avoid orthostatic issues Acute COPD exacerbation with asthma and pulmonary sarcoidosis encouraged incentive spirometer Metastatic colon cancer agree with palliative care discussion.   Elevated troponin secondary to demand ischemia Pneumomediastinum Left chest wall pain, musculoskeletal following coughing Atrial flutter s/p cardioversion- appears to be in AF on tele. Will check an ECG. currently off Multaq  due to elevated LFTs.  On Eliquis 5 mg twice daily. No cardioversion at this time as it will not likely hold and he will not likely tolerate the procedure due to respiratory issues.  Bifascicular block CAD s/p PCI to ramus, LAD and diagonal Hyperlipidemia statin and Zetia  held for LFTs Orthostatic hypotension-continue midodrine  Time spent coordinating care: 50 minutes     For questions or updates, please contact [facility] Please consult [email or web address] for contact info under        Signed, Emeline Calender, DO 05/01/2024, 10:14 AM

## 2024-05-02 DIAGNOSIS — J441 Chronic obstructive pulmonary disease with (acute) exacerbation: Secondary | ICD-10-CM | POA: Diagnosis not present

## 2024-05-02 DIAGNOSIS — I5033 Acute on chronic diastolic (congestive) heart failure: Secondary | ICD-10-CM | POA: Diagnosis not present

## 2024-05-02 LAB — GLUCOSE, CAPILLARY
Glucose-Capillary: 169 mg/dL — ABNORMAL HIGH (ref 70–99)
Glucose-Capillary: 103 mg/dL — ABNORMAL HIGH (ref 70–99)
Glucose-Capillary: 323 mg/dL — ABNORMAL HIGH (ref 70–99)
Glucose-Capillary: 220 mg/dL — ABNORMAL HIGH (ref 70–99)

## 2024-05-02 LAB — CBC
HCT: 33.7 % — ABNORMAL LOW (ref 39.0–52.0)
Hemoglobin: 10.9 g/dL — ABNORMAL LOW (ref 13.0–17.0)
MCH: 30.1 pg (ref 26.0–34.0)
MCHC: 32.3 g/dL (ref 30.0–36.0)
MCV: 93.1 fL (ref 80.0–100.0)
Platelets: 87 K/uL — ABNORMAL LOW (ref 150–400)
RBC: 3.62 MIL/uL — ABNORMAL LOW (ref 4.22–5.81)
RDW: 18.5 % — ABNORMAL HIGH (ref 11.5–15.5)
WBC: 10.5 K/uL (ref 4.0–10.5)
nRBC: 0 % (ref 0.0–0.2)

## 2024-05-02 LAB — BASIC METABOLIC PANEL WITH GFR
Anion gap: 11 (ref 5–15)
BUN: 22 mg/dL (ref 8–23)
CO2: 33 mmol/L — ABNORMAL HIGH (ref 22–32)
Calcium: 8.3 mg/dL — ABNORMAL LOW (ref 8.9–10.3)
Chloride: 87 mmol/L — ABNORMAL LOW (ref 98–111)
Creatinine, Ser: 0.78 mg/dL (ref 0.61–1.24)
GFR, Estimated: >60 mL/min (ref >60)
Glucose, Bld: 170 mg/dL — ABNORMAL HIGH (ref 70–99)
Potassium: 3.1 mmol/L — ABNORMAL LOW (ref 3.5–5.1)
Sodium: 131 mmol/L — ABNORMAL LOW (ref 135–145)

## 2024-05-02 LAB — PHOSPHORUS: Phosphorus: 4 mg/dL (ref 2.5–4.6)

## 2024-05-02 LAB — MAGNESIUM: Magnesium: 1.5 mg/dL — ABNORMAL LOW (ref 1.7–2.4)

## 2024-05-02 MED ORDER — PREDNISONE 20 MG PO TABS: 30.0000 mg | ORAL_TABLET | Freq: Every day | ORAL | Status: DC

## 2024-05-02 MED ORDER — MAGNESIUM SULFATE 4 GM/100ML IV SOLN
4.0000 g | Freq: Once | INTRAVENOUS | Status: AC
  Administered 2024-05-02: 4 g via INTRAVENOUS
  Filled 2024-05-02: qty 100

## 2024-05-02 MED ORDER — POTASSIUM CHLORIDE CRYS ER 20 MEQ PO TBCR
40.0000 meq | EXTENDED_RELEASE_TABLET | Freq: Two times a day (BID) | ORAL | Status: DC
  Administered 2024-05-02 – 2024-05-06 (×9): 40 meq via ORAL
  Filled 2024-05-02 (×9): qty 2

## 2024-05-02 MED ORDER — PREDNISONE 20 MG PO TABS
30.0000 mg | ORAL_TABLET | Freq: Every day | ORAL | Status: DC
Start: 2024-05-03 — End: 2024-05-10
  Administered 2024-05-03 – 2024-05-06 (×4): 30 mg via ORAL
  Filled 2024-05-02 (×4): qty 1

## 2024-05-02 NOTE — Progress Notes (Signed)
 Luis [name]  FMW:[id number] DOB: 03/27/1941 DOA: 04/07/2024 PCP: [name], MD    Brief Narrative:  83 year old with a history of metastatic colon cancer to the lungs liver and lymph nodes followed at [facility] since 2019, pulmonary sarcoidosis on chronic steroids, COPD, CAD, chronic diastolic CHF, HTN, HLD, and hypothyroidism who was admitted to the hospital 10/27 after presenting with progressive onset of worsening shortness of breath x 1 week.  Significant Events: 10/31 S/P bronchoscopy BAL culture no growth to date AFB fungus PJP DFA pending. Cytology atypical cells. Continue with prednisone  taper. 11/13: Mildly elevated blood pressure, still significant transaminitis-message sent to Dr. Clancy cardiologist regarding Multaq . Had 1 episode of bleeding while having bowel movement-history of hemorrhoid. 11/14: Hemodynamically stable, hemoglobin with some decreased 11.5, improving creatinine.  Becoming little more short of breath and hypoxic when stood for standing weight.  Giving 1 dose of IV Lasix  at 20 mg and restarting home Lasix . Pending formal cardiology consult regarding the use of Multaq . 11/15: Blood pressure started trending up so holding midodrine  and placed parameters.  Multaq  was discontinued by cardiology and if he goes back in a flutter, he will need DCCV. 11/17 patient very weak and deconditioned  11/18 continue to have dyspnea.   Goals of Care:   Code Status: Full Code   DVT prophylaxis: Place TED hose Start: 04/19/24 0741 apixaban  (ELIQUIS ) tablet 5 mg   Interim Hx: No acute events recorded overnight.  Afebrile.  Vital signs stable.  Continues to require heated high flow nasal cannula support.  Patient appears much more comfortable today resting in bed on heated high flow nasal cannula.  He has no new complaints.  He tells me he feels much better.  He is conversing without difficulty.  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 Continue systemic steroid tx with plan for slow prolonged prednisone  taper as per Pulmonary [Prednisone  30 mg daily for 7 days, Prednisone  20 mg daily for 7 days, Prednisone  15 mg daily for 7 days, Prednisone  10 mg daily for 7 days, Prednisone  5 mg daily for 7 days, then stop]   Pneumomediastinum Spontaneous and minimal without need for acute intervention  Acute on chronic diastolic CHF exacerbation TTE noted EF 70-74% with grade 1 DD -continue twice daily Lasix  - limited medical therapy due to risk of hypotension/orthostasis - Cardiology following - net negative ~23L since admit - Cards added metolazone  11/20   Filed Weights   04/30/24 0500 05/01/24 0500 05/02/24 0355  Weight: 89.7 kg 89.4 kg 86.4 kg    CAD Asymptomatic  Chronic atrial flutter Continue anticoagulation - Multaq  discontinued by Cardiology - if RVR recurs will need DCCV  HTN Has required midodrine  for blood pressure support during this admission - blood pressure presently normalized - attempt to wean off midodrine  as BP tolerates   Hypokalemia Due to use of diuretic - continue to supplement as needed  Hypomagnesemia Due to use of diuretic -supplement and follow  Chronic anemia Hemoglobin stable at present  DM2 Reasonably well-controlled at present  Hyperlipidemia Continue usual medical therapy  Hypothyroidism Continue usual  levothyroxine  dose  Colon cancer Appears stable at present    Family Communication: Spoke with wife at bedside Disposition:   Skilled Nursing-Short Term Rehab (<3 Hours/Day)04/30/2024 1400  Objective: Blood pressure 130/76, pulse 89, temperature (!) 97.5 F (36.4 C), temperature source Oral, resp. rate 20, height 5' 11 (1.803 m), weight 86.4 kg, SpO2 96%.  Intake/Output Summary (Last 24 hours) at 05/02/2024 0929 Last data filed at 05/02/2024 0840 Gross per 24 hour  Intake 260 ml  Output 1100 ml  Net -840 ml   Filed Weights   04/30/24 0500 05/01/24 0500 05/02/24 0355  Weight: 89.7 kg 89.4 kg 86.4 kg    Examination: General: No acute respiratory distress Lungs: Fine crackles throughout all fields with no wheezing with good air movement bilaterally on HHFNC Cardiovascular: Regular rate and rhythm without murmur  Abdomen: Nontender, nondistended, soft, bowel sounds positive, no rebound Extremities: No significant cyanosis, clubbing, or edema bilateral lower extremities  CBC: Recent Labs  Lab 04/29/24 0724 05/01/24 0600 05/02/24 0440  WBC 10.9* 10.3 10.5  HGB 11.3* 11.3* 10.9*  HCT 35.8* 35.9* 33.7*  MCV 95.0 94.0 93.1  PLT 91* 107* 87*   Basic Metabolic Panel: Recent Labs  Lab 04/28/24 0345 04/29/24 0724 04/30/24 0417 05/01/24 0600 05/02/24 0440  NA 133*   < > 136 137 131*  K 3.7   < > 4.4 4.2 3.1*  CL 93*   < > 93* 90* 87*  CO2 32   < > 32 28 33*  GLUCOSE 85   < > 146* 135* 170*  BUN 15   < > 15 20 22   CREATININE 0.75   < > 0.84 0.99 0.78  CALCIUM  8.1*   < > 8.2* 8.6* 8.3*  MG 1.6*  --  1.7 1.7 1.5*  PHOS 3.0  --   --   --  4.0   < > = values in this interval not displayed.   GFR: Estimated Creatinine Clearance: 74.5 mL/min (by C-G formula based on SCr of 0.78 mg/dL).   Scheduled Meds:  apixaban   5 mg Oral BID   benzonatate   100 mg Oral TID   budesonide -glycopyrrolate -formoterol   2 puff Inhalation BID   Chlorhexidine  Gluconate Cloth  6 each  Topical Daily   dextromethorphan   30 mg Oral BID   docusate sodium   100 mg Oral BID   furosemide   60 mg Intravenous BID   gabapentin   200 mg Oral QHS   guaiFENesin   600 mg Oral BID   insulin  aspart  0-15 Units Subcutaneous TID WC   levothyroxine   50 mcg Oral Q0600   midodrine   5 mg Oral TID WC   minocycline   100 mg Oral Daily   montelukast   10 mg Oral QHS   pantoprazole   40 mg Oral BID   predniSONE   40 mg Oral Q breakfast   sodium chloride  flush  10-40 mL Intracatheter Q12H   sucralfate   1 g Oral BID   sulfamethoxazole -trimethoprim   1 tablet Oral Once per day on Monday Wednesday Friday      LOS: 25 days   Reyes IVAR Moores, MD [facility] Office  [phone] Pager - Text Page per Tracey  If 7PM-7AM, please contact night-coverage per Amion 05/02/2024, 9:29 AM

## 2024-05-02 NOTE — Progress Notes (Signed)
 Progress Note  Patient Name: Luis [name] Date of Encounter: 05/02/2024  Primary Cardiologist: [name], MD   Subjective   Now on [facility]. Feels much better today. No complaints.   Inpatient Medications    Scheduled Meds:  apixaban   5 mg Oral BID   benzonatate   100 mg Oral TID   budesonide -glycopyrrolate -formoterol   2 puff Inhalation BID   Chlorhexidine  Gluconate Cloth  6 each Topical Daily   dextromethorphan   30 mg Oral BID   docusate sodium   100 mg Oral BID   furosemide   60 mg Intravenous BID   gabapentin   200 mg Oral QHS   guaiFENesin   600 mg Oral BID   insulin  aspart  0-15 Units Subcutaneous TID WC   levothyroxine   50 mcg Oral Q0600   midodrine   5 mg Oral TID WC   minocycline   100 mg Oral Daily   montelukast   10 mg Oral QHS   pantoprazole   40 mg Oral BID   potassium chloride   40 mEq Oral BID   [START ON 05/03/2024] predniSONE   30 mg Oral Q breakfast   sodium chloride  flush  10-40 mL Intracatheter Q12H   sucralfate   1 g Oral BID   sulfamethoxazole -trimethoprim   1 tablet Oral Once per day on Monday Wednesday Friday   Continuous Infusions:  magnesium  sulfate bolus IVPB 4 g (05/02/24 1127)   PRN Meds: acetaminophen , guaiFENesin -dextromethorphan , ipratropium-albuterol , melatonin, ondansetron  (ZOFRAN ) IV, mouth rinse, phenol, polyethylene glycol, sodium chloride , sodium chloride  flush   Vital Signs    Vitals:   05/02/24 0355 05/02/24 0827 05/02/24 0946 05/02/24 1106  BP: 130/76  134/70 119/76  Pulse: 89  89 92  Resp: 20  20 18   Temp: (!) 97.5 F (36.4 C)  97.8 F (36.6 C) 98 F (36.7 C)  TempSrc: Oral  Oral Oral  SpO2: 100% 96% 100% 100%  Weight: 86.4 kg     Height:        Intake/Output Summary (Last 24 hours) at 05/02/2024 1218 Last data filed at 05/02/2024 0948 Gross per 24 hour  Intake 260 ml  Output 1900 ml  Net -1640 ml   Filed Weights   04/30/24 0500 05/01/24 0500 05/02/24 0355  Weight: 89.7 kg 89.4 kg 86.4 kg    Telemetry    Normal  sinus rhythm- Personally Reviewed  ECG    Normal sinus rhythm with first-degree AV block, RBBB, LAFB and PACs- Personally Reviewed  Physical Exam   Physical Exam Vitals and nursing note reviewed.  Constitutional:      Appearance: Normal appearance.  HENT:     Head: Normocephalic and atraumatic.  Eyes:     Conjunctiva/sclera: Conjunctivae normal.  Cardiovascular:     Rate and Rhythm: Normal rate and regular rhythm.  Pulmonary:     Effort: Pulmonary effort is normal.     Breath sounds: Normal breath sounds. No rales.  Musculoskeletal:     Comments: Legs wrapped but still with 1+ edema at the knees  Skin:    Coloration: Skin is not jaundiced or pale.  Neurological:     Mental Status: He is alert.      Labs    Chemistry Recent Labs  Lab 04/28/24 0345 04/29/24 0724 04/30/24 0417 05/01/24 0600 05/02/24 0440  NA 133* 135 136 137 131*  K 3.7 3.5 4.4 4.2 3.1*  CL 93* 90* 93* 90* 87*  CO2 32 28 32 28 33*  GLUCOSE 85 90 146* 135* 170*  BUN 15 13 15 20 22   CREATININE  0.75 0.73 0.84 0.99 0.78  CALCIUM  8.1* 8.3* 8.2* 8.6* 8.3*  PROT 4.6* 3.5*  --  5.0*  --   ALBUMIN  2.1* 2.1*  --  2.2*  --   AST 87* 74*  --  76*  --   ALT 76* 70*  --  70*  --   ALKPHOS 786* 783*  --  1,027*  --   BILITOT 1.6* 1.4*  --  1.3*  --   GFRNONAA >60 >60 >60 >60 >60  ANIONGAP 8 17* 11 19* 11     Hematology Recent Labs  Lab 04/29/24 0724 05/01/24 0600 05/02/24 0440  WBC 10.9* 10.3 10.5  RBC 3.77* 3.82* 3.62*  HGB 11.3* 11.3* 10.9*  HCT 35.8* 35.9* 33.7*  MCV 95.0 94.0 93.1  MCH 30.0 29.6 30.1  MCHC 31.6 31.5 32.3  RDW 18.1* 18.4* 18.5*  PLT 91* 107* 87*    Cardiac EnzymesNo results for input(s): TROPONINI in the last 168 hours. No results for input(s): TROPIPOC in the last 168 hours.   BNPNo results for input(s): BNP, PROBNP in the last 168 hours.   DDimer No results for input(s): DDIMER in the last 168 hours.   Radiology    DG CHEST PORT 1 VIEW Result Date:  05/01/2024 EXAM: 1 VIEW(S) XRAY OF THE CHEST 05/01/2024 10:20:00 AM COMPARISON: 04/29/2024 CLINICAL HISTORY: SOB (shortness of breath) FINDINGS: LINES, TUBES AND DEVICES: Right chest port with catheter tip in stable position. LUNGS AND PLEURA: Surgical sutures over right lung base. Stable patchy airspace opacities in left lung and right lung base. Stable small bilateral pleural effusions. Calcified pulmonary nodules. No pneumothorax. HEART AND MEDIASTINUM: Calcified mediastinal and hilar lymph nodes. No acute abnormality of the cardiac and mediastinal silhouettes. BONES AND SOFT TISSUES: No acute osseous abnormality. IMPRESSION: 1. Persistent, bilateral patchy airspace opacities in the left lung and right lung base. 2. Unchanged small bilateral pleural effusions. Electronically signed by: Waddell Calk MD 05/01/2024 12:52 PM EST RP Workstation: [id number]    Cardiac Studies   RHC/LHC in 04/2022 for DOE     Patent stents in the LAD and diagonal.  Prior diagonal angioplasty site also widely patent.   Previously placed Ramus stent of unknown type is  widely patent.   The left ventricular systolic function is normal.   LV end diastolic pressure is normal.   The left ventricular ejection fraction is 55-65% by visual estimate.   There is no aortic valve stenosis.   Apical LAD with 70% lesion.  This is to distal and too small for PCI.  Not much myocardium supplied from this vessel.   Aortic saturation 94%, PA saturation 74%, PA pressure 40/14, mean PA pressure 23 mmHg, mean pulmonary capillary wedge pressure 15 mmHg, cardiac output 8.4 L/min, cardiac index 3.73.   Echocardiogram 03/2024 IMPRESSIONS     1. Left ventricular ejection fraction, by estimation, is 70 to 75%. The  left ventricle has hyperdynamic function. The left ventricle has no  regional wall motion abnormalities. Left ventricular diastolic parameters  are consistent with Grade I diastolic  dysfunction (impaired relaxation).   2.  Right ventricular systolic function is normal. The right ventricular  size is normal.   3. The mitral valve is degenerative. No evidence of mitral valve  regurgitation.   4. Difficult assessment for aortic stenosis, poor LVOT Doppler  evaluation. Consider future dedicated aortic stenosis study as outpatient.  The aortic valve was not well visualized. Aortic valve regurgitation is  not visualized. Aortic valve sclerosis is  present, with no evidence of aortic valve stenosis.   5. The inferior vena cava is normal in size with greater than 50%  respiratory variability, suggesting right atrial pressure of 3 mmHg.   Patient Profile     83 year old male with past medical history of metastatic colon cancer and mets to lungs, liver, sarcoidosis, COPD, CAD s/p PCI, HFpEF, atrial flutter s/p cardioversion, hypertension, hyperlipidemia, hypothyroidism who presented with progressive worsening of shortness of breath x 1 week with chest congestion and blood-tinged sputum.  Cardiology consult 11/14 for evaluation of increased LFTs in the setting of Multaq .  Multaq  is since been discontinued.  He has had difficulty initially with diuresis due to orthostasis but now has done well with Lasix  60 mg IV BID and a dose of metolazone .     Assessment & Plan   Assessment & Plan:   Acute hypoxic respiratory failure, multifactorial: acute bronchospastic COPD exacerbation, CAP, pulmonary sarcoidosis, pneumomediastinum and heart failure exacerbation now on HHFNC. Feeling much better and looks much more comfortable since getting diuresed and on HHFNC  Acute on chronic diastolic heart failure- good output with changing PO to IV lasix  and adding Metolazone . Orthostatic vitals have made it difficult for volume optimization earlier in the hospitalization.  Strict intake and output and daily weights.  Patient will need to be very careful getting up and will likely need assistance to avoid orthostatic issues. Continue with IV  lasix  today. Acute COPD exacerbation with asthma and pulmonary sarcoidosis encouraged incentive spirometer Metastatic colon cancer agree with palliative care discussion.   Elevated troponin secondary to demand ischemia Pneumomediastinum Left chest wall pain, musculoskeletal following coughing Atrial flutter s/p cardioversion- appears to be in AF on tele. Will check an ECG. currently off Multaq  due to elevated LFTs.  On Eliquis  5 mg twice daily. No cardioversion at this time as it will not likely hold and he will not likely tolerate the procedure due to respiratory issues.  Bifascicular block CAD s/p PCI to ramus, LAD and diagonal Hyperlipidemia statin and Zetia  held for LFTs Orthostatic hypotension-continue midodrine   Time spent coordinating care: 50 minutes     For questions or updates, please contact [facility] Please consult [email or web address] for contact info under        Signed, Emeline Calender, DO 05/02/2024, 12:18 PM

## 2024-05-02 NOTE — Progress Notes (Signed)
 Physical Therapy Treatment Patient Details Name: Luis [name] MRN: [id number] DOB: [birth year] Today's Date: 05/02/2024   History of Present Illness 83 y.o. male presents to [facility] hospital on 04/07/2024 with progressive SOB. Pt underwent bronchoscopy with biopsies 10/31. Symptomatic orthostatic hypotension during PT session 11/7. Diuresis held 11/9 but resumed 11/10 due to ongoing high oxygenation needs. PMH includes metastatic colon CA with mets to lungs, liver, and lymph nodes, sarcoidosis, COPD, CAD, HFpEF, HTN, HLD, hypothyroidism.    PT Comments  Pt received on BSC, agreeable for SPTA to assist in getting pt back to bed.. Pt on 35 lpm HHFNC, RN increased to 40 lpm for transfer back to bed after pt desat to 77% SpO2 during standing peri care. Pt required mod A +2 to stand, knees buckling, and poor posture. PT Ryan L notified that due to pts increase in O2 needs goals need to be re evaluated next session. Pt is feeling weaker today but still good effort, standing tolerance limited. Patient will benefit from continued inpatient follow up therapy, <3 hours/day. Vital Signs  Pulse Rate 97  Pulse Rate Source Monitor  BP 126/73  BP Location Right Arm  BP Method Automatic  Patient Position (if appropriate) Sitting (on BSC after standing for hygiene assist, pt sat to rest/catch his breath, BP taken at that time)  Oxygen Therapy  SpO2 92 %  O2 Device HHFNC  O2 Flow Rate (L/min) 40 L/min  FiO2 (%) 100 %  Patient Activity (if Appropriate) Other (Comment) (pt sitting on BSC after standing for hygiene assist, RN increased level briefly to 40L/min due to pt desat to 77% with good pleth with initial STS from commode)     Oxygen Therapy  O2 Flow Rate (L/min) 35 L/min (RN changed HHF back to 35L as pt returning to supine from EOB)   If plan is discharge home, recommend the following: A lot of help with walking and/or transfers;A lot of help with bathing/dressing/bathroom;Assistance with  cooking/housework;Assist for transportation;Help with stairs or ramp for entrance   Can travel by private vehicle     No  Equipment Recommendations  Other (comment);Wheelchair cushion (measurements PT);Wheelchair (measurements PT);BSC/3in1    Recommendations for Other Services       Precautions / Restrictions Precautions Precautions: Fall Recall of Precautions/Restrictions: Intact Precaution/Restrictions Comments: orthostatic with standing despite LE compression, recommend abd binder donned (only don when standing up), pt on [facility] 11/21 session Restrictions Weight Bearing Restrictions Per Provider Order: No     Mobility  Bed Mobility Overal bed mobility: Needs Assistance Bed Mobility: Sit to Supine       Sit to supine: HOB elevated, Used rails, Min assist, +2 for safety/equipment   General bed mobility comments: Pt needed assistance with LE and lines to get from EOB to supine    Transfers Overall transfer level: Needs assistance Equipment used: 1 person hand held assist Transfers: Sit to/from Stand, Bed to chair/wheelchair/BSC Sit to Stand: +2 safety/equipment, +2 physical assistance, Mod assist Stand pivot transfers: Mod assist         General transfer comment: Pt stood from [facility] for peri care, took a seated rest and then a standing pivot to EOB pt required mod A +2 for peri care, knee blocking, verbal cues for breathing technqiues, and power up. Pt RN increased HHFNC to 40lpm prior to standing pivot due to pt desatting to the high 70s after the first sit to stand.    Ambulation/Gait  Stairs             Wheelchair Mobility     Tilt Bed    Modified Rankin (Stroke Patients Only)       Balance Overall balance assessment: Needs assistance Sitting-balance support: Feet supported Sitting balance-Leahy Scale: Good Sitting balance - Comments: BSC   Standing balance support: Bilateral upper extremity supported Standing  balance-Leahy Scale: Poor Standing balance comment: one hand on SPTA other on bed rail                            Communication Communication Communication: Impaired Factors Affecting Communication: Other (comment) (Pt has difficulty speaking during/after exertion without SpO2 desatting.)  Cognition Arousal: Alert Behavior During Therapy: Anxious   PT - Cognitive impairments: Safety/Judgement, Sequencing, Problem solving                       PT - Cognition Comments: Increased processing time needed while pt standing; he does better with advance discussion of session plan and increased cues while standing due to orthostatic symptoms. Following commands: Intact      Cueing Cueing Techniques: Verbal cues, Gestural cues, Visual cues, Tactile cues  Exercises      General Comments General comments (skin integrity, edema, etc.): Pt on HHFNC 35 lmp, RN increased to 40 lpm prior to transfer back to bed due to pt desatting to high 70s on the first sit to stand. Pt desats a minute or 2 after exertion is over and takes increased time to recover. RN present in room for transfer to bed.      Pertinent Vitals/Pain Pain Assessment Pain Assessment: Faces Faces Pain Scale: Hurts a little bit Pain Location: generalized Pain Descriptors / Indicators: Discomfort Pain Intervention(s): Monitored during session, Limited activity within patient's tolerance, Repositioned    Home Living                          Prior Function            PT Goals (current goals can now be found in the care plan section) Acute Rehab PT Goals Patient Stated Goal: Improve activity tolerance and be able to ambulate. PT Goal Formulation: With patient/family Time For Goal Achievement: 05/06/24 Progress towards PT goals: Not progressing toward goals - comment;PT to reassess next treatment (PT Ryan L notified)    Frequency    Min 2X/week      PT Plan      Co-evaluation               AM-PAC PT 6 Clicks Mobility   Outcome Measure  Help needed turning from your back to your side while in a flat bed without using bedrails?: A Little Help needed moving from lying on your back to sitting on the side of a flat bed without using bedrails?: A Lot Help needed moving to and from a bed to a chair (including a wheelchair)?: A Lot Help needed standing up from a chair using your arms (e.g., wheelchair or bedside chair)?: A Lot Help needed to walk in hospital room?: Total Help needed climbing 3-5 steps with a railing? : Total 6 Click Score: 11    End of Session Equipment Utilized During Treatment: Oxygen Activity Tolerance: Patient limited by fatigue;Treatment limited secondary to medical complications (Comment) (Pt SpO2 desat to 76% on 35 lpm HHFNC) Patient left: in bed;with call bell/phone within reach;with bed  alarm set;with family/visitor present Nurse Communication: Mobility status;Other (comment) (Oxygen desat) PT Visit Diagnosis: Other abnormalities of gait and mobility (R26.89);Difficulty in walking, not elsewhere classified (R26.2);Muscle weakness (generalized) (M62.81)     Time: 1401-1430 PT Time Calculation (min) (ACUTE ONLY): 29 min  Charges:    $Therapeutic Activity: 23-37 mins PT General Charges $$ ACUTE PT VISIT: 1 Visit                     [name] Johnnie Jazzmine Kleiman 05/02/2024, 3:34 PM

## 2024-05-02 NOTE — Plan of Care (Signed)

## 2024-05-03 DIAGNOSIS — J441 Chronic obstructive pulmonary disease with (acute) exacerbation: Secondary | ICD-10-CM | POA: Diagnosis not present

## 2024-05-03 DIAGNOSIS — I4892 Unspecified atrial flutter: Secondary | ICD-10-CM | POA: Diagnosis not present

## 2024-05-03 LAB — BASIC METABOLIC PANEL WITH GFR
Anion gap: 11 (ref 5–15)
BUN: 21 mg/dL (ref 8–23)
CO2: 37 mmol/L — ABNORMAL HIGH (ref 22–32)
Calcium: 8.3 mg/dL — ABNORMAL LOW (ref 8.9–10.3)
Chloride: 87 mmol/L — ABNORMAL LOW (ref 98–111)
Creatinine, Ser: 1.02 mg/dL (ref 0.61–1.24)
GFR, Estimated: >60 mL/min (ref >60)
Glucose, Bld: 111 mg/dL — ABNORMAL HIGH (ref 70–99)
Potassium: 3.6 mmol/L (ref 3.5–5.1)
Sodium: 135 mmol/L (ref 135–145)

## 2024-05-03 LAB — GLUCOSE, CAPILLARY
Glucose-Capillary: 110 mg/dL — ABNORMAL HIGH (ref 70–99)
Glucose-Capillary: 216 mg/dL — ABNORMAL HIGH (ref 70–99)
Glucose-Capillary: 240 mg/dL — ABNORMAL HIGH (ref 70–99)
Glucose-Capillary: 312 mg/dL — ABNORMAL HIGH (ref 70–99)

## 2024-05-03 LAB — MAGNESIUM: Magnesium: 1.9 mg/dL (ref 1.7–2.4)

## 2024-05-03 MED ORDER — MIDODRINE HCL 5 MG PO TABS
10.0000 mg | ORAL_TABLET | Freq: Three times a day (TID) | ORAL | Status: DC
  Administered 2024-05-03 – 2024-05-08 (×14): 10 mg via ORAL
  Filled 2024-05-03 (×15): qty 2

## 2024-05-03 NOTE — TOC Progression Note (Addendum)
 Transition of Care [facility]) - Progression Note    Patient Details  Name: Luis [name] MRN: [id number] Date of Birth: 21-Sep-1940  Transition of Care [facility] CM/SW Contact  Isaiah Public, LCSWA Phone Number: 05/03/2024, 11:36 AM  Clinical Narrative:     TOC working on placement - per chart review patient was placed on HHFNC 35 liters - TOC following for medical readiness   Expected Discharge Plan: Skilled Nursing Facility Barriers to Discharge: Continued Medical Work up               Expected Discharge Plan and Services   Discharge Planning Services: CM Consult Post Acute Care Choice: NA Living arrangements for the past 2 months: Single Family Home                 DME Arranged:  (see note)         HH Arranged: NA           Social Drivers of Health (SDOH) Interventions SDOH Screenings   Food Insecurity: No Food Insecurity (04/07/2024)  Housing: Low Risk  (04/07/2024)  Transportation Needs: No Transportation Needs (04/07/2024)  Utilities: Not At Risk (04/07/2024)  Depression (PHQ2-9): Low Risk  (03/21/2024)  Social Connections: Socially Integrated (04/07/2024)  Tobacco Use: Medium Risk (04/07/2024)    Readmission Risk Interventions     No data to display

## 2024-05-03 NOTE — Progress Notes (Signed)
 RT note. Patient placed on HFNC/ salter 11 L sat 100% with no labored breathing noted. HHFNC left in patients room If needed again.    05/03/24 1343  Therapy Vitals  Pulse Rate 93  Resp (!) 26  MEWS Score/Color  MEWS Score 2  MEWS Score Color Yellow  Respiratory Assessment  Assessment Type Assess only  Respiratory Pattern Regular;Unlabored  Chest Assessment Chest expansion symmetrical  Cough Non-productive;Weak  Sputum Specimen Source Spontaneous cough  Bilateral Breath Sounds Rhonchi;Diminished  Oxygen Therapy/Pulse Ox  O2 Device (S)  HFNC  SpO2 99 %  O2 Therapy Oxygen humidified  O2 Flow Rate (L/min) (S)  11 L/min

## 2024-05-03 NOTE — Progress Notes (Signed)
 Luis [name]  FMW:[id number] DOB: [birth year] DOA: 04/07/2024 PCP: [name], MD    Brief Narrative:  83 year old with a history of metastatic colon cancer to the lungs liver and lymph nodes followed at [facility] since 2019, pulmonary sarcoidosis on chronic steroids, COPD, CAD, chronic diastolic CHF, HTN, HLD, and hypothyroidism who was admitted to the hospital 10/27 after presenting with progressive onset of worsening shortness of breath x 1 week.  Significant Events: 10/31 S/P bronchoscopy BAL culture no growth to date AFB fungus PJP DFA pending. Cytology atypical cells. Continue with prednisone  taper. 11/13: Mildly elevated blood pressure, still significant transaminitis-message sent to Dr. Clancy cardiologist regarding Multaq . Had 1 episode of bleeding while having bowel movement-history of hemorrhoid. 11/14: Hemodynamically stable, hemoglobin with some decreased 11.5, improving creatinine.  Becoming little more short of breath and hypoxic when stood for standing weight.  Giving 1 dose of IV Lasix  at 20 mg and restarting home Lasix . Pending formal cardiology consult regarding the use of Multaq . 11/15: Blood pressure started trending up so holding midodrine  and placed parameters.  Multaq  was discontinued by cardiology and if he goes back in a flutter, he will need DCCV. 11/17 patient very weak and deconditioned  11/18 continue to have dyspnea.   Goals of Care:   Code Status: Full Code   DVT prophylaxis: Place TED hose Start: 04/19/24 0741 apixaban  (ELIQUIS ) tablet 5 mg   Interim Hx: No acute issues reported overnight.  Afebrile.  Vital signs stable.  Resting comfortably in bed breathing comfortably on HHFNC.  No new complaints.  States he rested well last night.  Appears stable compared to yesterday.  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 Continue systemic steroid tx with plan for slow prolonged prednisone  taper as per Pulmonary [Prednisone  30 mg daily for 7 days, Prednisone  20 mg daily for 7 days, Prednisone  15 mg daily for 7 days, Prednisone  10 mg daily for 7 days, Prednisone  5 mg daily for 7 days, then stop] -has stabilized on HHFNC - hope to be able to wean over next few days  Pneumomediastinum Spontaneous and minimal without need for acute intervention  Acute on chronic diastolic CHF exacerbation TTE noted EF 70-74% with grade 1 DD -continue twice daily Lasix  - limited medical therapy due to risk of hypotension/orthostasis - Cardiology following - net negative ~24L since admit - Cards added metolazone  11/20   Filed Weights   05/01/24 0500 05/02/24 0355 05/03/24 0500  Weight: 89.4 kg 86.4 kg 86.7 kg    CAD Asymptomatic  Chronic atrial flutter Continue anticoagulation - Multaq  discontinued by Cardiology - if RVR recurs will need DCCV -heart rate controlled at present  HTN Has required midodrine  for blood pressure support during this admission - blood pressure presently normalized - attempt to wean off midodrine  as BP tolerates   Hypokalemia Due to use of diuretic - continue to supplement as needed  Hypomagnesemia Due to use of diuretic -has been corrected with supplementation  Chronic anemia Hemoglobin stable  at present  DM2 Reasonably well-controlled at present  Hyperlipidemia Continue usual medical therapy  Hypothyroidism Continue usual  levothyroxine  dose  Colon cancer Appears stable at present    Family Communication: No family present at time of exam today Disposition:   Skilled Nursing-Short Term Rehab (<3 Hours/Day)05/02/2024 1400  Objective: Blood pressure 98/62, pulse 96, temperature 97.9 F (36.6 C), temperature source Oral, resp. rate 20, height 5' 11 (1.803 m), weight 86.7 kg, SpO2 92%.  Intake/Output Summary (Last 24 hours) at 05/03/2024 0840 Last data filed at 05/02/2024 2215 Gross per 24 hour  Intake 106.34 ml  Output 1600 ml  Net -1493.66 ml   Filed Weights   05/01/24 0500 05/02/24 0355 05/03/24 0500  Weight: 89.4 kg 86.4 kg 86.7 kg    Examination: General: No acute respiratory distress at rest on HHFNC Lungs: Fine crackles throughout all fields with no wheezing with good air movement bilaterally on HHFNC Cardiovascular: Regular rate and rhythm without murmur  Abdomen: Nontender, nondistended, soft, bowel sounds positive, no rebound Extremities: No significant edema bilateral lower extremities  CBC: Recent Labs  Lab 04/29/24 0724 05/01/24 0600 05/02/24 0440  WBC 10.9* 10.3 10.5  HGB 11.3* 11.3* 10.9*  HCT 35.8* 35.9* 33.7*  MCV 95.0 94.0 93.1  PLT 91* 107* 87*   Basic Metabolic Panel: Recent Labs  Lab 04/28/24 0345 04/29/24 0724 05/01/24 0600 05/02/24 0440 05/03/24 0530  NA 133*   < > 137 131* 135  K 3.7   < > 4.2 3.1* 3.6  CL 93*   < > 90* 87* 87*  CO2 32   < > 28 33* 37*  GLUCOSE 85   < > 135* 170* 111*  BUN 15   < > 20 22 21   CREATININE 0.75   < > 0.99 0.78 1.02  CALCIUM  8.1*   < > 8.6* 8.3* 8.3*  MG 1.6*   < > 1.7 1.5* 1.9  PHOS 3.0  --   --  4.0  --    < > = values in this interval not displayed.   GFR: Estimated Creatinine Clearance: 58.4 mL/min (by C-G formula based on SCr of 1.02 mg/dL).   Scheduled Meds:  apixaban   5 mg Oral BID   benzonatate   100 mg Oral TID   budesonide -glycopyrrolate -formoterol   2 puff Inhalation BID   Chlorhexidine  Gluconate Cloth   6 each Topical Daily   dextromethorphan   30 mg Oral BID   docusate sodium   100 mg Oral BID   furosemide   60 mg Intravenous BID   gabapentin   200 mg Oral QHS   guaiFENesin   600 mg Oral BID   insulin  aspart  0-15 Units Subcutaneous TID WC   levothyroxine   50 mcg Oral Q0600   midodrine   5 mg Oral TID WC   minocycline   100 mg Oral Daily   montelukast   10 mg Oral QHS   pantoprazole   40 mg Oral BID   potassium chloride   40 mEq Oral BID   predniSONE   30 mg Oral Q breakfast   sodium chloride  flush  10-40 mL Intracatheter Q12H   sucralfate   1 g Oral BID   sulfamethoxazole -trimethoprim   1 tablet Oral Once per day on Monday Wednesday Friday      LOS: 26 days   Reyes IVAR Moores, MD [facility] Office  [phone] Pager - Text Page per Tracey  If 7PM-7AM, please contact night-coverage per Amion 05/03/2024, 8:40 AM

## 2024-05-04 DIAGNOSIS — I5033 Acute on chronic diastolic (congestive) heart failure: Secondary | ICD-10-CM | POA: Diagnosis not present

## 2024-05-04 DIAGNOSIS — J441 Chronic obstructive pulmonary disease with (acute) exacerbation: Secondary | ICD-10-CM | POA: Diagnosis not present

## 2024-05-04 LAB — BASIC METABOLIC PANEL WITH GFR
Anion gap: 6 (ref 5–15)
BUN: 19 mg/dL (ref 8–23)
CO2: 42 mmol/L — ABNORMAL HIGH (ref 22–32)
Calcium: 8.1 mg/dL — ABNORMAL LOW (ref 8.9–10.3)
Chloride: 87 mmol/L — ABNORMAL LOW (ref 98–111)
Creatinine, Ser: 0.78 mg/dL (ref 0.61–1.24)
GFR, Estimated: >60 mL/min (ref >60)
Glucose, Bld: 162 mg/dL — ABNORMAL HIGH (ref 70–99)
Potassium: 3.6 mmol/L (ref 3.5–5.1)
Sodium: 135 mmol/L (ref 135–145)

## 2024-05-04 LAB — GLUCOSE, CAPILLARY
Glucose-Capillary: 141 mg/dL — ABNORMAL HIGH (ref 70–99)
Glucose-Capillary: 295 mg/dL — ABNORMAL HIGH (ref 70–99)
Glucose-Capillary: 279 mg/dL — ABNORMAL HIGH (ref 70–99)
Glucose-Capillary: 152 mg/dL — ABNORMAL HIGH (ref 70–99)

## 2024-05-04 MED ORDER — KETOROLAC TROMETHAMINE 15 MG/ML IJ SOLN
15.0000 mg | Freq: Once | INTRAMUSCULAR | Status: AC
  Administered 2024-05-04: 15 mg via INTRAVENOUS
  Filled 2024-05-04: qty 1

## 2024-05-04 NOTE — Progress Notes (Signed)
 Luis [name]  FMW:[id number] DOB: [birth year] DOA: 04/07/2024 PCP: [name], MD    Brief Narrative:  83 year old with a history of metastatic colon cancer to the lungs liver and lymph nodes followed at [facility] since 2019, pulmonary sarcoidosis on chronic steroids, COPD, CAD, chronic diastolic CHF, HTN, HLD, and hypothyroidism who was admitted to the hospital 10/27 after presenting with progressive onset of worsening shortness of breath x 1 week.  Significant Events: 10/31 S/P bronchoscopy BAL culture no growth to date AFB fungus PJP DFA pending. Cytology atypical cells. Continue with prednisone  taper. 11/13: Mildly elevated blood pressure, still significant transaminitis-message sent to Dr. Clancy cardiologist regarding Multaq . Had 1 episode of bleeding while having bowel movement-history of hemorrhoid. 11/14: Hemodynamically stable, hemoglobin with some decreased 11.5, improving creatinine.  Becoming little more short of breath and hypoxic when stood for standing weight.  Giving 1 dose of IV Lasix  at 20 mg and restarting home Lasix . Pending formal cardiology consult regarding the use of Multaq . 11/15: Blood pressure started trending up so holding midodrine  and placed parameters.  Multaq  was discontinued by cardiology and if he goes back in a flutter, he will need DCCV. 11/17 patient very weak and deconditioned  11/18 continue to have dyspnea.   Goals of Care:   Code Status: Full Code   DVT prophylaxis: Place TED hose Start: 04/19/24 0741 apixaban  (ELIQUIS ) tablet 5 mg   Interim Hx: The patient was able to be weaned to salter high flow nasal cannula yesterday afternoon.  No acute events recorded overnight.  Afebrile.  Vital signs stable.  Appears comfortable at the time of my visit.  Is alert conversant and pleasant.  States he is feeling much better today.  Assessment & Plan:  Acute bronchospastic COPD exacerbation - acute on chronic hypoxic respiratory failure Continue  inhaled medications as well as systemic steroids - continue flutter valve and incentive spirometer -clearly making progress at present with weaning to salter high flow nasal cannula in last 24 hours  Community-acquired pneumonia Patchy infiltrates on the left with right basilar consolidation as well - BAL with greater than 50% neutrophils consistent with bacterial infection - cultures unrevealing due to preceding antibiotics - completed a 14-day course of antibiotic therapy - f/u CXR 11/20 w/o new findings   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 Continue systemic steroid tx with plan for slow prolonged prednisone  taper as per Pulmonary [Prednisone  30 mg daily for 7 days, Prednisone  20 mg daily for 7 days, Prednisone  15 mg daily for 7 days, Prednisone  10 mg daily for 7 days, Prednisone  5 mg daily for 7 days, then stop] -now making progress in weaning of oxygen support  Pneumomediastinum Spontaneous and minimal without need for acute intervention  Acute on chronic diastolic CHF exacerbation TTE noted EF 70-74% with grade 1 DD -continue twice daily Lasix  - limited medical therapy due to risk of hypotension/orthostasis - Cardiology following - net negative ~26L since admit - Cards added metolazone  11/20   Filed Weights   05/01/24 0500 05/02/24 0355 05/03/24 0500  Weight: 89.4 kg 86.4 kg 86.7 kg    CAD Asymptomatic  Chronic atrial flutter Continue anticoagulation - Multaq  discontinued by Cardiology - if RVR recurs will need DCCV - heart rate controlled at present  HTN Has required midodrine  for blood pressure support during this admission - blood pressure presently stable with midodrine  support  Hypokalemia Due to use of diuretic - continue to supplement as needed  Hypomagnesemia Due to use of diuretic -  has been corrected with supplementation  Chronic anemia Hemoglobin stable at present  DM2 Reasonably well-controlled at  present  Hyperlipidemia Continue usual medical therapy  Hypothyroidism Continue usual levothyroxine  dose  Colon cancer Appears stable at present    Family Communication: Spoke with spouse at bedside Disposition:   Skilled Nursing-Short Term Rehab (<3 Hours/Day)05/02/2024 1400  Objective: Blood pressure 106/72, pulse 90, temperature 98.1 F (36.7 C), temperature source Oral, resp. rate (!) 25, height 5' 11 (1.803 m), weight 86.7 kg, SpO2 100%.  Intake/Output Summary (Last 24 hours) at 05/04/2024 0918 Last data filed at 05/04/2024 0847 Gross per 24 hour  Intake 10 ml  Output 1600 ml  Net -1590 ml   Filed Weights   05/01/24 0500 05/02/24 0355 05/03/24 0500  Weight: 89.4 kg 86.4 kg 86.7 kg    Examination: General: No acute respiratory distress at rest on salter high flow oxygen support Lungs: Fine crackles throughout all fields with no wheezing with good air movement bilaterally  Cardiovascular: Regular rate and rhythm without murmur  Abdomen: Nontender, nondistended, soft, bowel sounds positive, no rebound Extremities: No significant edema B LE   CBC: Recent Labs  Lab 04/29/24 0724 05/01/24 0600 05/02/24 0440  WBC 10.9* 10.3 10.5  HGB 11.3* 11.3* 10.9*  HCT 35.8* 35.9* 33.7*  MCV 95.0 94.0 93.1  PLT 91* 107* 87*   Basic Metabolic Panel: Recent Labs  Lab 04/28/24 0345 04/29/24 0724 05/01/24 0600 05/02/24 0440 05/03/24 0530 05/04/24 0325  NA 133*   < > 137 131* 135 135  K 3.7   < > 4.2 3.1* 3.6 3.6  CL 93*   < > 90* 87* 87* 87*  CO2 32   < > 28 33* 37* 42*  GLUCOSE 85   < > 135* 170* 111* 162*  BUN 15   < > 20 22 21 19   CREATININE 0.75   < > 0.99 0.78 1.02 0.78  CALCIUM  8.1*   < > 8.6* 8.3* 8.3* 8.1*  MG 1.6*   < > 1.7 1.5* 1.9  --   PHOS 3.0  --   --  4.0  --   --    < > = values in this interval not displayed.   GFR: Estimated Creatinine Clearance: 74.5 mL/min (by C-G formula based on SCr of 0.78 mg/dL).   Scheduled Meds:  apixaban   5 mg  Oral BID   benzonatate   100 mg Oral TID   budesonide -glycopyrrolate -formoterol   2 puff Inhalation BID   Chlorhexidine  Gluconate Cloth  6 each Topical Daily   dextromethorphan   30 mg Oral BID   docusate sodium   100 mg Oral BID   furosemide   60 mg Intravenous BID   gabapentin   200 mg Oral QHS   guaiFENesin   600 mg Oral BID   insulin  aspart  0-15 Units Subcutaneous TID WC   levothyroxine   50 mcg Oral Q0600   midodrine   10 mg Oral TID WC   minocycline   100 mg Oral Daily   montelukast   10 mg Oral QHS   pantoprazole   40 mg Oral BID   potassium chloride   40 mEq Oral BID   predniSONE   30 mg Oral Q breakfast   sodium chloride  flush  10-40 mL Intracatheter Q12H   sucralfate   1 g Oral BID   sulfamethoxazole -trimethoprim   1 tablet Oral Once per day on Monday Wednesday Friday      LOS: 27 days   Reyes IVAR Moores, MD [facility] Office  [phone] Pager - Text Page  per Amion  If 7PM-7AM, please contact night-coverage per Amion 05/04/2024, 9:18 AM

## 2024-05-04 NOTE — Progress Notes (Signed)
 Progress Note  Patient Name: Luis [name] Date of Encounter: 05/04/2024  Primary Cardiologist: [name], MD   Subjective   In good spirits, no complaints. Per wife today is a good day  Inpatient Medications    Scheduled Meds:  apixaban   5 mg Oral BID   benzonatate   100 mg Oral TID   budesonide -glycopyrrolate -formoterol   2 puff Inhalation BID   Chlorhexidine  Gluconate Cloth  6 each Topical Daily   dextromethorphan   30 mg Oral BID   docusate sodium   100 mg Oral BID   furosemide   60 mg Intravenous BID   gabapentin   200 mg Oral QHS   guaiFENesin   600 mg Oral BID   insulin  aspart  0-15 Units Subcutaneous TID WC   levothyroxine   50 mcg Oral Q0600   midodrine   10 mg Oral TID WC   minocycline   100 mg Oral Daily   montelukast   10 mg Oral QHS   pantoprazole   40 mg Oral BID   potassium chloride   40 mEq Oral BID   predniSONE   30 mg Oral Q breakfast   sodium chloride  flush  10-40 mL Intracatheter Q12H   sucralfate   1 g Oral BID   sulfamethoxazole -trimethoprim   1 tablet Oral Once per day on Monday Wednesday Friday   Continuous Infusions:   PRN Meds: acetaminophen , guaiFENesin -dextromethorphan , ipratropium-albuterol , melatonin, ondansetron  (ZOFRAN ) IV, mouth rinse, phenol, polyethylene glycol, sodium chloride , sodium chloride  flush   Vital Signs    Vitals:   05/03/24 1343 05/03/24 1409 05/03/24 1558 05/03/24 2335  BP:   116/68 106/72  Pulse: 93 86 90   Resp: (!) 26 19 (!) 25   Temp:   98.1 F (36.7 C) 98.1 F (36.7 C)  TempSrc:   Oral Oral  SpO2: 99% 100% 100%   Weight:      Height:        Intake/Output Summary (Last 24 hours) at 05/04/2024 0625 Last data filed at 05/03/2024 2300 Gross per 24 hour  Intake --  Output 1600 ml  Net -1600 ml   Filed Weights   05/01/24 0500 05/02/24 0355 05/03/24 0500  Weight: 89.4 kg 86.4 kg 86.7 kg    Telemetry    sinus rhythm and PACs- Personally Reviewed  ECG    Normal sinus rhythm with first-degree AV block, RBBB,  LAFB and PACs- Personally Reviewed  Physical Exam   GEN: No acute distress.   Neck: No JVD Cardiac: RRR, no murmurs, rubs, or gallops.  Respiratory: Clear to auscultation bilaterally. GI: Soft, nontender, non-distended  MS: mild bilateral LE edema; No deformity. Neuro:  Nonfocal  Psych: Normal affect     Labs    Chemistry Recent Labs  Lab 04/28/24 0345 04/29/24 0724 04/30/24 0417 05/01/24 0600 05/02/24 0440 05/03/24 0530 05/04/24 0325  NA 133* 135   < > 137 131* 135 135  K 3.7 3.5   < > 4.2 3.1* 3.6 3.6  CL 93* 90*   < > 90* 87* 87* 87*  CO2 32 28   < > 28 33* 37* 42*  GLUCOSE 85 90   < > 135* 170* 111* 162*  BUN 15 13   < > 20 22 21 19   CREATININE 0.75 0.73   < > 0.99 0.78 1.02 0.78  CALCIUM  8.1* 8.3*   < > 8.6* 8.3* 8.3* 8.1*  PROT 4.6* 3.5*  --  5.0*  --   --   --   ALBUMIN  2.1* 2.1*  --  2.2*  --   --   --  AST 87* 74*  --  76*  --   --   --   ALT 76* 70*  --  70*  --   --   --   ALKPHOS 786* 783*  --  1,027*  --   --   --   BILITOT 1.6* 1.4*  --  1.3*  --   --   --   GFRNONAA >60 >60   < > >60 >60 >60 >60  ANIONGAP 8 17*   < > 19* 11 11 6    < > = values in this interval not displayed.     Hematology Recent Labs  Lab 04/29/24 0724 05/01/24 0600 05/02/24 0440  WBC 10.9* 10.3 10.5  RBC 3.77* 3.82* 3.62*  HGB 11.3* 11.3* 10.9*  HCT 35.8* 35.9* 33.7*  MCV 95.0 94.0 93.1  MCH 30.0 29.6 30.1  MCHC 31.6 31.5 32.3  RDW 18.1* 18.4* 18.5*  PLT 91* 107* 87*    Cardiac EnzymesNo results for input(s): TROPONINI in the last 168 hours. No results for input(s): TROPIPOC in the last 168 hours.   BNPNo results for input(s): BNP, PROBNP in the last 168 hours.   DDimer No results for input(s): DDIMER in the last 168 hours.   Radiology    No results found.   Cardiac Studies   RHC/LHC in 04/2022 for DOE     Patent stents in the LAD and diagonal.  Prior diagonal angioplasty site also widely patent.   Previously placed Ramus stent of unknown type  is  widely patent.   The left ventricular systolic function is normal.   LV end diastolic pressure is normal.   The left ventricular ejection fraction is 55-65% by visual estimate.   There is no aortic valve stenosis.   Apical LAD with 70% lesion.  This is to distal and too small for PCI.  Not much myocardium supplied from this vessel.   Aortic saturation 94%, PA saturation 74%, PA pressure 40/14, mean PA pressure 23 mmHg, mean pulmonary capillary wedge pressure 15 mmHg, cardiac output 8.4 L/min, cardiac index 3.73.   Echocardiogram 03/2024 IMPRESSIONS     1. Left ventricular ejection fraction, by estimation, is 70 to 75%. The  left ventricle has hyperdynamic function. The left ventricle has no  regional wall motion abnormalities. Left ventricular diastolic parameters  are consistent with Grade I diastolic  dysfunction (impaired relaxation).   2. Right ventricular systolic function is normal. The right ventricular  size is normal.   3. The mitral valve is degenerative. No evidence of mitral valve  regurgitation.   4. Difficult assessment for aortic stenosis, poor LVOT Doppler  evaluation. Consider future dedicated aortic stenosis study as outpatient.  The aortic valve was not well visualized. Aortic valve regurgitation is  not visualized. Aortic valve sclerosis is  present, with no evidence of aortic valve stenosis.   5. The inferior vena cava is normal in size with greater than 50%  respiratory variability, suggesting right atrial pressure of 3 mmHg.   Patient Profile     83 year old male with past medical history of metastatic colon cancer and mets to lungs, liver, sarcoidosis, COPD, CAD s/p PCI, HFpEF, atrial flutter s/p cardioversion, hypertension, hyperlipidemia, hypothyroidism who presented with progressive worsening of shortness of breath x 1 week with chest congestion and blood-tinged sputum.  Cardiology consult 11/14 for evaluation of increased LFTs in the setting of Multaq .   Multaq  is since been discontinued.  He has had difficulty initially with diuresis due to orthostasis  but now has done well with Lasix  60 mg IV BID and a dose of metolazone .     Assessment & Plan   Assessment & Plan:   Acute hypoxic respiratory failure, multifactorial: acute bronchospastic COPD exacerbation, CAP, pulmonary sarcoidosis, pneumomediastinum and heart failure exacerbation . Feeling much better and looks much more comfortable since getting diuresed. Acute on chronic diastolic heart failure- good output with changing PO to IV lasix  and adding Metolazone . Orthostatic vitals have made it difficult for volume optimization earlier in the hospitalization.  Strict intake and output and daily weights.  Patient will need to be very careful getting up and will likely need assistance to avoid orthostatic issues. Continue with IV lasix  today. Acute COPD exacerbation with asthma and pulmonary sarcoidosis encouraged incentive spirometer Metastatic colon cancer agree with palliative care discussion.   Elevated troponin secondary to demand ischemia Pneumomediastinum Left chest wall pain, musculoskeletal following coughing Atrial flutter s/p cardioversion- SR with frequent PACs and RBBB Bifascicular block CAD s/p PCI to ramus, LAD and diagonal Hyperlipidemia statin and Zetia  held for LFTs Orthostatic hypotension-continue midodrine        For questions or updates, please contact [facility] Please consult [email or web address] for contact info under        Gavyn Zoss A Doree Kuehne, MD

## 2024-05-04 NOTE — Plan of Care (Signed)

## 2024-05-04 NOTE — Progress Notes (Signed)
 Progress Note  Patient Name: Luis [name] Date of Encounter: 05/04/2024  Primary Cardiologist: [name], MD   Subjective   In good spirits, feels he is improving. Eating [employer] soup for lunch. Wife at bedside.   Inpatient Medications    Scheduled Meds:  apixaban   5 mg Oral BID   benzonatate   100 mg Oral TID   budesonide -glycopyrrolate -formoterol   2 puff Inhalation BID   Chlorhexidine  Gluconate Cloth  6 each Topical Daily   dextromethorphan   30 mg Oral BID   docusate sodium   100 mg Oral BID   furosemide   60 mg Intravenous BID   gabapentin   200 mg Oral QHS   guaiFENesin   600 mg Oral BID   insulin  aspart  0-15 Units Subcutaneous TID WC   levothyroxine   50 mcg Oral Q0600   midodrine   10 mg Oral TID WC   minocycline   100 mg Oral Daily   montelukast   10 mg Oral QHS   pantoprazole   40 mg Oral BID   potassium chloride   40 mEq Oral BID   predniSONE   30 mg Oral Q breakfast   sodium chloride  flush  10-40 mL Intracatheter Q12H   sucralfate   1 g Oral BID   sulfamethoxazole -trimethoprim   1 tablet Oral Once per day on Monday Wednesday Friday   Continuous Infusions:   PRN Meds: acetaminophen , guaiFENesin -dextromethorphan , ipratropium-albuterol , melatonin, ondansetron  (ZOFRAN ) IV, mouth rinse, phenol, polyethylene glycol, sodium chloride , sodium chloride  flush   Vital Signs    Vitals:   05/03/24 1343 05/03/24 1409 05/03/24 1558 05/03/24 2335  BP:   116/68 106/72  Pulse: 93 86 90   Resp: (!) 26 19 (!) 25   Temp:   98.1 F (36.7 C) 98.1 F (36.7 C)  TempSrc:   Oral Oral  SpO2: 99% 100% 100%   Weight:      Height:        Intake/Output Summary (Last 24 hours) at 05/04/2024 0715 Last data filed at 05/03/2024 2300 Gross per 24 hour  Intake --  Output 1600 ml  Net -1600 ml   Filed Weights   05/01/24 0500 05/02/24 0355 05/03/24 0500  Weight: 89.4 kg 86.4 kg 86.7 kg    Telemetry    sinus rhythm and PACs and blocked pacs- Personally Reviewed  ECG    Normal  sinus rhythm with first-degree AV block, RBBB, LAFB and PACs- Personally Reviewed  Physical Exam   GEN: No acute distress.   Neck: No JVD Cardiac: RRR, no murmurs, rubs, or gallops.  Respiratory: Clear to auscultation bilaterally. GI: Soft, nontender, non-distended  MS: mild bilateral LE edema; No deformity. Neuro:  Nonfocal  Psych: Normal affect     Labs    Chemistry Recent Labs  Lab 04/28/24 0345 04/29/24 0724 04/30/24 0417 05/01/24 0600 05/02/24 0440 05/03/24 0530 05/04/24 0325  NA 133* 135   < > 137 131* 135 135  K 3.7 3.5   < > 4.2 3.1* 3.6 3.6  CL 93* 90*   < > 90* 87* 87* 87*  CO2 32 28   < > 28 33* 37* 42*  GLUCOSE 85 90   < > 135* 170* 111* 162*  BUN 15 13   < > 20 22 21 19   CREATININE 0.75 0.73   < > 0.99 0.78 1.02 0.78  CALCIUM  8.1* 8.3*   < > 8.6* 8.3* 8.3* 8.1*  PROT 4.6* 3.5*  --  5.0*  --   --   --   ALBUMIN  2.1* 2.1*  --  2.2*  --   --   --   AST 87* 74*  --  76*  --   --   --   ALT 76* 70*  --  70*  --   --   --   ALKPHOS 786* 783*  --  1,027*  --   --   --   BILITOT 1.6* 1.4*  --  1.3*  --   --   --   GFRNONAA >60 >60   < > >60 >60 >60 >60  ANIONGAP 8 17*   < > 19* 11 11 6    < > = values in this interval not displayed.     Hematology Recent Labs  Lab 04/29/24 0724 05/01/24 0600 05/02/24 0440  WBC 10.9* 10.3 10.5  RBC 3.77* 3.82* 3.62*  HGB 11.3* 11.3* 10.9*  HCT 35.8* 35.9* 33.7*  MCV 95.0 94.0 93.1  MCH 30.0 29.6 30.1  MCHC 31.6 31.5 32.3  RDW 18.1* 18.4* 18.5*  PLT 91* 107* 87*    Cardiac EnzymesNo results for input(s): TROPONINI in the last 168 hours. No results for input(s): TROPIPOC in the last 168 hours.   BNPNo results for input(s): BNP, PROBNP in the last 168 hours.   DDimer No results for input(s): DDIMER in the last 168 hours.   Radiology    No results found.   Cardiac Studies   RHC/LHC in 04/2022 for DOE     Patent stents in the LAD and diagonal.  Prior diagonal angioplasty site also widely patent.    Previously placed Ramus stent of unknown type is  widely patent.   The left ventricular systolic function is normal.   LV end diastolic pressure is normal.   The left ventricular ejection fraction is 55-65% by visual estimate.   There is no aortic valve stenosis.   Apical LAD with 70% lesion.  This is to distal and too small for PCI.  Not much myocardium supplied from this vessel.   Aortic saturation 94%, PA saturation 74%, PA pressure 40/14, mean PA pressure 23 mmHg, mean pulmonary capillary wedge pressure 15 mmHg, cardiac output 8.4 L/min, cardiac index 3.73.   Echocardiogram 03/2024 IMPRESSIONS     1. Left ventricular ejection fraction, by estimation, is 70 to 75%. The  left ventricle has hyperdynamic function. The left ventricle has no  regional wall motion abnormalities. Left ventricular diastolic parameters  are consistent with Grade I diastolic  dysfunction (impaired relaxation).   2. Right ventricular systolic function is normal. The right ventricular  size is normal.   3. The mitral valve is degenerative. No evidence of mitral valve  regurgitation.   4. Difficult assessment for aortic stenosis, poor LVOT Doppler  evaluation. Consider future dedicated aortic stenosis study as outpatient.  The aortic valve was not well visualized. Aortic valve regurgitation is  not visualized. Aortic valve sclerosis is  present, with no evidence of aortic valve stenosis.   5. The inferior vena cava is normal in size with greater than 50%  respiratory variability, suggesting right atrial pressure of 3 mmHg.   Patient Profile     83 year old male with past medical history of metastatic colon cancer and mets to lungs, liver, sarcoidosis, COPD, CAD s/p PCI, HFpEF, atrial flutter s/p cardioversion, hypertension, hyperlipidemia, hypothyroidism who presented with progressive worsening of shortness of breath x 1 week with chest congestion and blood-tinged sputum.  Cardiology consult 11/14 for evaluation  of increased LFTs in the setting of Multaq .  Multaq  is since been discontinued.  He has had difficulty initially with diuresis due to orthostasis but now has done well with Lasix  60 mg IV BID and a dose of metolazone .     Assessment & Plan   Assessment & Plan:   Acute hypoxic respiratory failure, multifactorial: acute bronchospastic COPD exacerbation, CAP, pulmonary sarcoidosis, pneumomediastinum and heart failure exacerbation . On HFNC with decreasing O2 req. Improving.  Acute on chronic diastolic heart failure- good output with changing PO to IV lasix  and adding Metolazone . Orthostatic vitals have made it difficult for volume optimization earlier in the hospitalization.  Strict intake and output and daily weights.  Patient will need to be very careful getting up and will likely need assistance to avoid orthostatic issues. Continue with IV lasix  today. Acute COPD exacerbation with asthma and pulmonary sarcoidosis encouraged incentive spirometer Metastatic colon cancer agree with palliative care discussion.   Elevated troponin secondary to demand ischemia Pneumomediastinum Left chest wall pain, musculoskeletal following coughing Atrial flutter s/p cardioversion- SR with frequent PACs and RBBB Bifascicular block CAD s/p PCI to ramus, LAD and diagonal Hyperlipidemia statin and Zetia  held for LFTs Orthostatic hypotension-continue midodrine        For questions or updates, please contact [facility] Please consult [email or web address] for contact info under        Myndi Wamble A Axie Hayne, MD

## 2024-05-05 ENCOUNTER — Ambulatory Visit: Attending: Cardiology | Admitting: Cardiology

## 2024-05-05 DIAGNOSIS — I4892 Unspecified atrial flutter: Secondary | ICD-10-CM | POA: Diagnosis not present

## 2024-05-05 DIAGNOSIS — J441 Chronic obstructive pulmonary disease with (acute) exacerbation: Secondary | ICD-10-CM | POA: Diagnosis not present

## 2024-05-05 DIAGNOSIS — Z515 Encounter for palliative care: Secondary | ICD-10-CM | POA: Diagnosis not present

## 2024-05-05 DIAGNOSIS — R0602 Shortness of breath: Secondary | ICD-10-CM | POA: Diagnosis not present

## 2024-05-05 DIAGNOSIS — I5033 Acute on chronic diastolic (congestive) heart failure: Secondary | ICD-10-CM | POA: Diagnosis not present

## 2024-05-05 DIAGNOSIS — R531 Weakness: Secondary | ICD-10-CM | POA: Diagnosis not present

## 2024-05-05 LAB — CBC
HCT: 31.3 % — ABNORMAL LOW (ref 39.0–52.0)
Hemoglobin: 9.7 g/dL — ABNORMAL LOW (ref 13.0–17.0)
MCH: 29.5 pg (ref 26.0–34.0)
MCHC: 31 g/dL (ref 30.0–36.0)
MCV: 95.1 fL (ref 80.0–100.0)
Platelets: 94 K/uL — ABNORMAL LOW (ref 150–400)
RBC: 3.29 MIL/uL — ABNORMAL LOW (ref 4.22–5.81)
RDW: 18.6 % — ABNORMAL HIGH (ref 11.5–15.5)
WBC: 10 K/uL (ref 4.0–10.5)
nRBC: 0 % (ref 0.0–0.2)

## 2024-05-05 LAB — RENAL FUNCTION PANEL
Albumin: 1.8 g/dL — ABNORMAL LOW (ref 3.5–5.0)
Anion gap: 12 (ref 5–15)
BUN: 19 mg/dL (ref 8–23)
CO2: 38 mmol/L — ABNORMAL HIGH (ref 22–32)
Calcium: 8.1 mg/dL — ABNORMAL LOW (ref 8.9–10.3)
Chloride: 86 mmol/L — ABNORMAL LOW (ref 98–111)
Creatinine, Ser: 0.73 mg/dL (ref 0.61–1.24)
GFR, Estimated: >60 mL/min (ref >60)
Glucose, Bld: 151 mg/dL — ABNORMAL HIGH (ref 70–99)
Phosphorus: 3 mg/dL (ref 2.5–4.6)
Potassium: 3.8 mmol/L (ref 3.5–5.1)
Sodium: 136 mmol/L (ref 135–145)

## 2024-05-05 LAB — MAGNESIUM: Magnesium: 1.6 mg/dL — ABNORMAL LOW (ref 1.7–2.4)

## 2024-05-05 LAB — GLUCOSE, CAPILLARY
Glucose-Capillary: 277 mg/dL — ABNORMAL HIGH (ref 70–99)
Glucose-Capillary: 175 mg/dL — ABNORMAL HIGH (ref 70–99)
Glucose-Capillary: 188 mg/dL — ABNORMAL HIGH (ref 70–99)

## 2024-05-05 MED ORDER — MORPHINE SULFATE (CONCENTRATE) 10 MG /0.5 ML PO SOLN
5.0000 mg | ORAL | Status: DC | PRN
  Administered 2024-05-05: 5 mg via ORAL
  Filled 2024-05-05: qty 0.5

## 2024-05-05 MED ORDER — TRAMADOL HCL 50 MG PO TABS
50.0000 mg | ORAL_TABLET | Freq: Four times a day (QID) | ORAL | Status: DC | PRN
  Administered 2024-05-09: 50 mg via ORAL
  Filled 2024-05-05: qty 1

## 2024-05-05 MED ORDER — SALINE SPRAY 0.65 % NA SOLN
2.0000 | Freq: Three times a day (TID) | NASAL | Status: DC
  Administered 2024-05-05 – 2024-05-09 (×12): 2 via NASAL
  Filled 2024-05-05: qty 44

## 2024-05-05 MED ORDER — MORPHINE SULFATE (CONCENTRATE) 10 MG /0.5 ML PO SOLN
5.0000 mg | Freq: Four times a day (QID) | ORAL | Status: DC | PRN
  Administered 2024-05-06 – 2024-05-09 (×3): 5 mg via ORAL
  Filled 2024-05-05 (×6): qty 0.5

## 2024-05-05 MED ORDER — MAGNESIUM SULFATE 2 GM/50ML IV SOLN
2.0000 g | Freq: Once | INTRAVENOUS | Status: AC
  Administered 2024-05-05: 2 g via INTRAVENOUS
  Filled 2024-05-05: qty 50

## 2024-05-05 NOTE — Progress Notes (Signed)
 Patient oxygen saturation 80% on 4L. This RN placed 5L with oxygen saturation at 85%. Patient saturation at 89% on 6L.

## 2024-05-05 NOTE — Plan of Care (Signed)

## 2024-05-05 NOTE — Progress Notes (Signed)
  Progress Note  Patient Name: Luis [name] Date of Encounter: 05/05/2024 [facility] Cardiologist: [name], MD   Interval Summary   Actively with productive cough, suctioning out sputum  Placed on HHFNC at 40 L this AM per RT Reports improvement since being placed back on HHFNC Tells me his most improved was after he underwent bronchoscopy 10/31 Reports some muscle pain that was relieved with Toradol , primary to manage   Vital Signs Vitals:   05/05/24 0445 05/05/24 0756 05/05/24 0827 05/05/24 0958  BP: 138/85 102/67    Pulse: 81 91  (!) 106  Resp: 16 20  (!) 24  Temp: 98.2 F (36.8 C) 97.7 F (36.5 C)    TempSrc: Oral Oral    SpO2: 99% (!) 85% 92% 93%  Weight:      Height:        Intake/Output Summary (Last 24 hours) at 05/05/2024 1030 Last data filed at 05/05/2024 0450 Gross per 24 hour  Intake --  Output 1200 ml  Net -1200 ml      05/03/2024    5:00 AM 05/02/2024    3:55 AM 05/01/2024    5:00 AM  Last 3 Weights  Weight (lbs) 191 lb 2.2 oz 190 lb 7.6 oz 197 lb 1.6 oz  Weight (kg) 86.7 kg 86.4 kg 89.404 kg     Telemetry/ECG  Primarily sinus with PACs, tachycardic this morning - Personally Reviewed  Physical Exam  GEN: productive cough with sputum production, recently placed back on HHFNC at 40 L.   Neck: No JVD Cardiac: mildly tachycardic, no murmurs, rubs, or gallops.  Respiratory: actively coughing, producing sputum, breathing better with HHFNC, coarse breath sounds throughout. GI: Soft, nontender, non-distended  MS: 1+ LE edema on left   Assessment & Plan   Acute on chronic HFpEF Orthostatic hypotension  Presented with worsening shortness of breath Started on IV Lasix  and metolazone  Orthostatic hypotension limits GDMT and aggressive diuresis  Urine output 2.1 L yesterday, net -17.6 L this admission Renal function and electrolytes stable  Albumin  1.8 today Currently on IV Lasix  60 mg BID Currently requiring midodrine  10 mg TID    Paroxysmal atrial fibrillation  Bifascicular block Underwent DCCV 01/2024 Currently in sinus rhythm with HR 90-100s Currently on Eliquis  5 mg BID  CAD s/p PCI to ramus, LAD, diagonal  Hyperlipidemia  Denies any chest pain  Home statin and Zetia  held in setting of elevated LFTs  Acute hypoxic respiratory failure Acute bronchospastic COPD exacerbation Community acquired PNA Pulmonary sarcoidosis Pneumomediastinum Shortness of breath clearly multifactorial as above  Seen by respiratory therapy this morning Had to be placed on HHFNC at 40 L due to decreased SpO2  Continue with treatment of inhalers/steroids and antibiotics per primary  Metastatic colon cancer Noted to have metastatic cancer to liver, lungs S/p liver resection, chemotherapy  LFTs remain elevated Seen by palliative medicine this admission Remains full code Patient is hopeful to discharge to SNF and then return home    For questions or updates, please contact [facility] Please consult [email or web address] for contact info under   Signed, Waddell DELENA Donath, PA-C

## 2024-05-05 NOTE — Progress Notes (Signed)
 Orthopedic Tech Progress Note Patient Details:  Luis [name] [birth year] [id number]  Ortho Devices Type of Ortho Device: Abdominal binder Ortho Device/Splint Location: Lower Back Ortho Device/Splint Interventions: Ordered, Application   Post Interventions Patient Tolerated: Well Instructions Provided: Adjustment of device  [name] 05/05/2024, 1:07 PM

## 2024-05-05 NOTE — Progress Notes (Signed)
 Orthopedic Tech Progress Note Patient Details:  IVERSON SEES Aug 30, 1940 [id number]  Ortho Devices Type of Ortho Device: Ace wrap, Unna boot Ortho Device/Splint Location: BLE Ortho Device/Splint Interventions: Removal, Ordered, Application   Post Interventions Patient Tolerated: Well Instructions Provided: Care of device  Delanna LITTIE Pac 05/05/2024, 5:47 PM

## 2024-05-05 NOTE — Progress Notes (Signed)
 Luis [name]  FMW:[id number] DOB: 10-03-1940 DOA: 04/07/2024 PCP: [name], MD    Brief Narrative:  83 year old with a history of metastatic colon cancer to the lungs liver and lymph nodes followed at [facility] since 2019, pulmonary sarcoidosis on chronic steroids, COPD, CAD, chronic diastolic CHF, HTN, HLD, and hypothyroidism who was admitted to the hospital 10/27 after presenting with progressive onset of worsening shortness of breath x 1 week.  Significant Events: 10/31 S/P bronchoscopy BAL culture no growth to date AFB fungus PJP DFA pending. Cytology atypical cells. Continue with prednisone  taper. 11/13: Mildly elevated blood pressure, still significant transaminitis-message sent to Dr. Clancy cardiologist regarding Multaq . Had 1 episode of bleeding while having bowel movement-history of hemorrhoid. 11/14: Hemodynamically stable, hemoglobin with some decreased 11.5, improving creatinine.  Becoming little more short of breath and hypoxic when stood for standing weight.  Giving 1 dose of IV Lasix  at 20 mg and restarting home Lasix . Pending formal cardiology consult regarding the use of Multaq . 11/15: Blood pressure started trending up so holding midodrine  and placed parameters.  Multaq  was discontinued by cardiology and if he goes back in a flutter, he will need DCCV. 11/17 patient very weak and deconditioned  11/18 continue to have dyspnea.   Goals of Care:   Code Status: Full Code   DVT prophylaxis: Place TED hose Start: 04/19/24 0741 apixaban  (ELIQUIS ) tablet 5 mg   Interim Hx: No acute events recorded overnight.  Afebrile.  Vital signs stable.  This morning however the patient suffered a paroxysmal of shortness of breath, which rapidly improved with return to heated high flow nasal cannula.  At the time my visit he is resting comfortably on HHFNC with no new complaints reporting that his breathing has settled down again.  Assessment & Plan:  Acute bronchospastic COPD  exacerbation - acute on chronic hypoxic respiratory failure Continue inhaled medications as well as systemic steroids on prolonged slow taper - continue flutter valve and incentive spirometer -has a slight setback today, but overall still seems to be making progress at present with weaning to salter high flow nasal cannula -once patient can be weaned to conventional nasal cannula he will be a candidate for discharge to a rehab facility -at present his respiratory status continues to wax and wane  Community-acquired pneumonia Patchy infiltrates on the left with right basilar consolidation as well - BAL with greater than 50% neutrophils consistent with bacterial infection - cultures unrevealing due to preceding antibiotics - completed a 14-day course of antibiotic therapy - f/u CXR 11/20 w/o new findings   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 Continue systemic steroid tx with plan for slow prolonged prednisone  taper as per Pulmonary [Prednisone  30 mg daily for 7 days, Prednisone  20 mg daily for 7 days, Prednisone  15 mg daily for 7 days, Prednisone  10 mg daily for 7 days, Prednisone  5 mg daily for 7 days, then stop] -continues to make gradual progress  Pneumomediastinum Spontaneous and minimal without need for acute intervention  Acute on chronic diastolic CHF exacerbation TTE noted EF 70-74% with grade 1 DD -continue twice daily Lasix  - limited medical therapy due to risk of hypotension/orthostasis - Cardiology following - net negative ~28L since admit - Cards added metolazone  11/20 -ongoing diuresis appears to be significantly contributing to his respiratory improvement  Filed Weights   05/01/24 0500 05/02/24 0355 05/03/24 0500  Weight: 89.4 kg 86.4 kg 86.7 kg    CAD Asymptomatic  Chronic atrial flutter Continue anticoagulation - Multaq  discontinued  by Cardiology - if RVR recurs will need DCCV - heart rate controlled at present  HTN Has required midodrine   for blood pressure support during this admission - blood pressure presently stable with midodrine  support  Hypokalemia Due to use of diuretic -corrected with supplementation  Hypomagnesemia Due to use of diuretic -supplement further with goal of 2.0  Chronic anemia Hemoglobin stable at present  DM2 Reasonably well-controlled at present  Hyperlipidemia Continue usual medical therapy  Hypothyroidism Continue usual levothyroxine  dose  Colon cancer Appears stable at present    Family Communication: I spoke with his wife at bedside Disposition:   Skilled Nursing-Short Term Rehab (<3 Hours/Day)05/02/2024 1400  Objective: Blood pressure 102/67, pulse 91, temperature 97.7 F (36.5 C), temperature source Oral, resp. rate 20, height 5' 11 (1.803 m), weight 86.7 kg, SpO2 92%.  Intake/Output Summary (Last 24 hours) at 05/05/2024 0903 Last data filed at 05/05/2024 0450 Gross per 24 hour  Intake --  Output 1200 ml  Net -1200 ml   Filed Weights   05/01/24 0500 05/02/24 0355 05/03/24 0500  Weight: 89.4 kg 86.4 kg 86.7 kg    Examination: General: No acute respiratory distress at the time of my visit on HHFNC Lungs: Fine crackles throughout all fields - no wheezing with good air movement bilaterally  Cardiovascular: Regular rate and rhythm without murmur  Abdomen: Nontender, nondistended, soft, bowel sounds positive, no rebound Extremities: Trace edema B LE   CBC: Recent Labs  Lab 05/01/24 0600 05/02/24 0440 05/05/24 0500  WBC 10.3 10.5 10.0  HGB 11.3* 10.9* 9.7*  HCT 35.9* 33.7* 31.3*  MCV 94.0 93.1 95.1  PLT 107* 87* 94*   Basic Metabolic Panel: Recent Labs  Lab 05/02/24 0440 05/03/24 0530 05/04/24 0325 05/05/24 0500  NA 131* 135 135 136  K 3.1* 3.6 3.6 3.8  CL 87* 87* 87* 86*  CO2 33* 37* 42* 38*  GLUCOSE 170* 111* 162* 151*  BUN 22 21 19 19   CREATININE 0.78 1.02 0.78 0.73  CALCIUM  8.3* 8.3* 8.1* 8.1*  MG 1.5* 1.9  --  1.6*  PHOS 4.0  --   --  3.0    GFR: Estimated Creatinine Clearance: 74.5 mL/min (by C-G formula based on SCr of 0.73 mg/dL).   Scheduled Meds:  apixaban   5 mg Oral BID   benzonatate   100 mg Oral TID   budesonide -glycopyrrolate -formoterol   2 puff Inhalation BID   Chlorhexidine  Gluconate Cloth  6 each Topical Daily   dextromethorphan   30 mg Oral BID   docusate sodium   100 mg Oral BID   furosemide   60 mg Intravenous BID   gabapentin   200 mg Oral QHS   guaiFENesin   600 mg Oral BID   insulin  aspart  0-15 Units Subcutaneous TID WC   levothyroxine   50 mcg Oral Q0600   midodrine   10 mg Oral TID WC   minocycline   100 mg Oral Daily   montelukast   10 mg Oral QHS   pantoprazole   40 mg Oral BID   potassium chloride   40 mEq Oral BID   predniSONE   30 mg Oral Q breakfast   sodium chloride  flush  10-40 mL Intracatheter Q12H   sucralfate   1 g Oral BID   sulfamethoxazole -trimethoprim   1 tablet Oral Once per day on Monday Wednesday Friday      LOS: 28 days   Reyes IVAR Moores, MD [facility] Office  [phone] Pager - Text Page per Tracey  If 7PM-7AM, please contact night-coverage per Amion 05/05/2024, 9:03 AM

## 2024-05-05 NOTE — Progress Notes (Signed)
 Physical Therapy Treatment Patient Details Name: Luis [name] MRN: [id number] DOB: 07/28/1940 Today's Date: 05/05/2024   History of Present Illness 83 y.o. male adm 04/07/2024 with progressive SOB, PNA, multifactorial respiratory failure. 10/31 bronch. PMHx: metastatic colon CA, sarcoidosis, COPD, CAD, HFpEF, HTN, HLD, hypothyroidism.    PT Comments  Pt pleasant and eager to get OOB. Pt with primofit not fully capped on arrival, linens wet and pt unaware. Assist for linen chang and pericare. Pt able to transition to EOB but required in active recovery with cues for breathing before able to attempt standing and additional 8 min before able to attempt standing again. Pt with decreased pulmonary reserve requiring cue and assist for all activity and increased time. Pt with decreased LB strength needing assist to stand. Wife present and encouraging throughout. Goals downgraded to reflect pt function and medical decline. Pt on HHFNC at 100% /40L throughout session. Abdominal binder received while sitting EOB and applied for 2nd stand and transfer.  Initial transition to EOB 99/69 (76), HR 104 After 11 min EOB 115/71, HR 105 After transfer to chair with abdominal binder 88/62 (71)   If plan is discharge home, recommend the following: A lot of help with walking and/or transfers;A lot of help with bathing/dressing/bathroom;Assistance with cooking/housework;Assist for transportation;Help with stairs or ramp for entrance   Can travel by private vehicle     No  Equipment Recommendations  Wheelchair cushion (measurements PT);Wheelchair (measurements PT);BSC/3in1    Recommendations for Other Services       Precautions / Restrictions Precautions Precautions: Fall;Other (comment) Recall of Precautions/Restrictions: Intact Precaution/Restrictions Comments: orthostatic with standing despite binder, HHFNC     Mobility  Bed Mobility Overal bed mobility: Needs Assistance Bed Mobility: Supine  to Sit     Supine to sit: Supervision, HOB elevated, Used rails     General bed mobility comments: HOB 45 degrees, assist to manage lines, use of  rail with pt able to pivot to right side of bed without assist    Transfers Overall transfer level: Needs assistance   Transfers: Sit to/from Stand, Bed to chair/wheelchair/BSC Sit to Stand: Mod assist Stand pivot transfers: Mod assist         General transfer comment: mod assist with bed elevated and left knee blocked to power up to standing. initial trial with RW present and pt supporting self on RW for pericare, needing additional seated rest prior to standing again. 2nd stand with pt hooking arms with therapist and continued knee block for stand pivot to chair, desaturation to 83% with initial stand and 85% on 2nd    Ambulation/Gait               General Gait Details: unable this date due to respiratory status   Stairs             Wheelchair Mobility     Tilt Bed    Modified Rankin (Stroke Patients Only)       Balance Overall balance assessment: Needs assistance Sitting-balance support: Feet supported, No upper extremity supported Sitting balance-Leahy Scale: Good Sitting balance - Comments: EOB without support   Standing balance support: Single extremity supported, Bilateral upper extremity supported, Reliant on assistive device for balance, During functional activity Standing balance-Leahy Scale: Poor Standing balance comment: UB support in standing                            Communication Communication Communication: No apparent difficulties  Cognition Arousal: Alert Behavior During Therapy: Flat affect   PT - Cognitive impairments: Safety/Judgement, Sequencing, Problem solving                       PT - Cognition Comments: pt with linens soaked and unaware, increased cues to not talk when recovering from movement Following commands: Intact      Cueing Cueing Techniques:  Verbal cues, Gestural cues, Tactile cues  Exercises General Exercises - Lower Extremity Long Arc Quad: Strengthening, Both, 10 reps, Seated    General Comments        Pertinent Vitals/Pain Pain Assessment Pain Assessment: No/denies pain    Home Living                          Prior Function            PT Goals (current goals can now be found in the care plan section) Acute Rehab PT Goals Patient Stated Goal: Improve activity tolerance and be able to ambulate. PT Goal Formulation: With patient/family Time For Goal Achievement: 05/19/24 Potential to Achieve Goals: Fair Progress towards PT goals: Goals downgraded-see care plan    Frequency    Min 2X/week      PT Plan      Co-evaluation              AM-PAC PT 6 Clicks Mobility   Outcome Measure  Help needed turning from your back to your side while in a flat bed without using bedrails?: A Little Help needed moving from lying on your back to sitting on the side of a flat bed without using bedrails?: A Lot Help needed moving to and from a bed to a chair (including a wheelchair)?: A Lot Help needed standing up from a chair using your arms (e.g., wheelchair or bedside chair)?: A Lot Help needed to walk in hospital room?: Total Help needed climbing 3-5 steps with a railing? : Total 6 Click Score: 11    End of Session Equipment Utilized During Treatment: Oxygen Activity Tolerance: Patient limited by fatigue;Patient tolerated treatment well Patient left: in chair;with call bell/phone within reach;with chair alarm set;with family/visitor present Nurse Communication: Mobility status PT Visit Diagnosis: Other abnormalities of gait and mobility (R26.89);Difficulty in walking, not elsewhere classified (R26.2);Muscle weakness (generalized) (M62.81)     Time: 8773-8688 PT Time Calculation (min) (ACUTE ONLY): 45 min  Charges:    $Therapeutic Activity: 38-52 mins PT General Charges $$ ACUTE PT VISIT: 1  Visit                     Lenoard SQUIBB, PT Acute Rehabilitation Services Office: [phone]    Maysel Mccolm B Ameera Tigue 05/05/2024, 1:50 PM

## 2024-05-05 NOTE — Progress Notes (Signed)
 Morpine concentrate 0.25ml oral given per order. Morphine  concentrate 0.25 ml oral wasted with Lacey, RN.

## 2024-05-05 NOTE — Progress Notes (Signed)
 Orthopedic Tech Progress Note Patient Details:  Luis [name] 1941-05-03 [id number]  Removed UNNA BOOTS this morning, patient stated his legs were extremely sore and needed a break. I told him ill come back around this evening before 5/6pm  Patient ID: Luis [name], male   DOB: 1941/05/11, 83 y.o.   MRN: [id number]  Delanna LITTIE Pac 05/05/2024, 11:27 AM

## 2024-05-05 NOTE — Progress Notes (Signed)
 Patient ID: Luis [name], male   DOB: 1940/06/19, 83 y.o.   MRN: [id number]    Progress Note from the Palliative Medicine Team at [facility]   Patient Name: Luis [name]        Date: 05/05/2024 DOB: 01-17-1941  Age: 83 y.o. MRN#: [id number] Attending Physician: Danton Reyes DASEN, MD Primary Care Physician: [name], MD Admit Date: 04/07/2024   Reason for Consultation/Follow-up   Establishing Goals of Care   HPI/ Brief Hospital Review  83 y.o. male   admitted on 04/07/2024 PMH of metastatic colon cancer with metasteses to lungs/ liver, sarcoidosis, COPD, CAD s/p PCI, HFpEF, aflutter s/p cardioversion, hypertension, HLD, hypothyroidism that presented with progressive worsening of SOB for a week with chest congestion and blood tinged sputum production.    Admitted for treatment and stabilization.   Patient and family continue to work closely with TOC for next steps in transition of care, fortunately patient has not stabilized enough medically to realistically be able to tolerate SNF for rehab at this time   Patient and family face treatment option decisions, advanced directive decisions and anticipatory care needs.   Subjective  Extensive chart review has been completed prior to meeting with patient/family  including labs, vital signs, progress/consult notes, orders, medications and available advance directive documents.   This NP assessed patient at the bedside as a follow up for palliative medicine needs an emotional support, initially no family at bedside.   Patient appears weak, dyspneic at rest, constant cough.  Education and conversation regarding utilization of low-dose opioid for respiratory symptoms.  Patient is open I will try anything if it helps      - Roxanol 5 mg p.o./sublingual now and every 6 hours as needed for cough/dyspnea/air hunger (I followed up with patient after he received first dose and he reports great relief from respiratory  symptoms)   I returned later in the day to the bedside at wife's request.      Continue conversation regarding seriousness of the current medical situation.  She verbalizes concern that patient is not progressing as hoped.  What do we do?   Patient was able to verbalize he is doing everything he can do, and the medical team is offering all viable treatment options and providing full medical support appropriate for the patient's complex medical conditions.  Again offered education and contemplation on concept of failure to thrive and the limitations of medical interventions to prolong quality of life when the body does fail to thrive.  Education offered today regarding  the importance of continued conversation with his wife and the  medical providers regarding overall plan of care and treatment options,  ensuring decisions are within the context of the patients values and GOCs.  Questions and concerns addressed   Discussed with primary team and nursing staff  EMT will continue to support holistically   Time:  65 minutes  Detailed review of medical records ( labs, imaging, vital signs), medically appropriate exam ( MS, skin, cardiac,  resp)   discussed with treatment team, counseling and education to patient, family, staff, documenting clinical information, medication management, coordination of care    Ronal Plants NP  Palliative Medicine Team Team Phone # [phone] Pager [phone]

## 2024-05-06 DIAGNOSIS — I480 Paroxysmal atrial fibrillation: Secondary | ICD-10-CM | POA: Diagnosis not present

## 2024-05-06 DIAGNOSIS — R531 Weakness: Secondary | ICD-10-CM | POA: Diagnosis not present

## 2024-05-06 DIAGNOSIS — R0609 Other forms of dyspnea: Secondary | ICD-10-CM | POA: Diagnosis not present

## 2024-05-06 DIAGNOSIS — I5033 Acute on chronic diastolic (congestive) heart failure: Secondary | ICD-10-CM | POA: Diagnosis not present

## 2024-05-06 DIAGNOSIS — J441 Chronic obstructive pulmonary disease with (acute) exacerbation: Secondary | ICD-10-CM | POA: Diagnosis not present

## 2024-05-06 DIAGNOSIS — J188 Other pneumonia, unspecified organism: Secondary | ICD-10-CM | POA: Diagnosis not present

## 2024-05-06 LAB — GLUCOSE, CAPILLARY
Glucose-Capillary: 139 mg/dL — ABNORMAL HIGH (ref 70–99)
Glucose-Capillary: 198 mg/dL — ABNORMAL HIGH (ref 70–99)
Glucose-Capillary: 308 mg/dL — ABNORMAL HIGH (ref 70–99)

## 2024-05-06 LAB — RENAL FUNCTION PANEL
Albumin: 2 g/dL — ABNORMAL LOW (ref 3.5–5.0)
Anion gap: 9 (ref 5–15)
BUN: 22 mg/dL (ref 8–23)
CO2: 35 mmol/L — ABNORMAL HIGH (ref 22–32)
Calcium: 8.4 mg/dL — ABNORMAL LOW (ref 8.9–10.3)
Chloride: 87 mmol/L — ABNORMAL LOW (ref 98–111)
Creatinine, Ser: 0.97 mg/dL (ref 0.61–1.24)
GFR, Estimated: >60 mL/min (ref >60)
Glucose, Bld: 187 mg/dL — ABNORMAL HIGH (ref 70–99)
Phosphorus: 3.3 mg/dL (ref 2.5–4.6)
Potassium: 5 mmol/L (ref 3.5–5.1)
Sodium: 131 mmol/L — ABNORMAL LOW (ref 135–145)

## 2024-05-06 LAB — CBC
HCT: 35.6 % — ABNORMAL LOW (ref 39.0–52.0)
Hemoglobin: 11.1 g/dL — ABNORMAL LOW (ref 13.0–17.0)
MCH: 29.9 pg (ref 26.0–34.0)
MCHC: 31.2 g/dL (ref 30.0–36.0)
MCV: 96 fL (ref 80.0–100.0)
Platelets: 123 K/uL — ABNORMAL LOW (ref 150–400)
RBC: 3.71 MIL/uL — ABNORMAL LOW (ref 4.22–5.81)
RDW: 18.6 % — ABNORMAL HIGH (ref 11.5–15.5)
WBC: 15.6 K/uL — ABNORMAL HIGH (ref 4.0–10.5)
nRBC: 0 % (ref 0.0–0.2)

## 2024-05-06 LAB — MAGNESIUM: Magnesium: 1.7 mg/dL (ref 1.7–2.4)

## 2024-05-06 MED ORDER — PREDNISONE 20 MG PO TABS: 20.0000 mg | ORAL_TABLET | Freq: Every day | ORAL | Status: DC

## 2024-05-06 MED ORDER — PREDNISONE 20 MG PO TABS: 30.0000 mg | ORAL_TABLET | Freq: Every day | ORAL | Status: DC

## 2024-05-06 MED ORDER — PREDNISONE 10 MG PO TABS
10.0000 mg | ORAL_TABLET | Freq: Every day | ORAL | Status: DC
Start: 2024-05-24 — End: 2024-05-09

## 2024-05-06 MED ORDER — PREDNISONE 10 MG PO TABS: 10.0000 mg | ORAL_TABLET | Freq: Every day | ORAL | Status: DC

## 2024-05-06 MED ORDER — PREDNISONE 10 MG PO TABS: 30.0000 mg | ORAL_TABLET | Freq: Every day | ORAL | Status: DC

## 2024-05-06 MED ORDER — PREDNISONE 20 MG PO TABS
30.0000 mg | ORAL_TABLET | Freq: Every day | ORAL | Status: AC
  Administered 2024-05-07 – 2024-05-09 (×3): 30 mg via ORAL
  Filled 2024-05-06 (×3): qty 1

## 2024-05-06 MED ORDER — PREDNISONE 5 MG PO TABS
15.0000 mg | ORAL_TABLET | Freq: Every day | ORAL | Status: DC
Start: 2024-05-17 — End: 2024-05-09

## 2024-05-06 MED ORDER — PREDNISONE 5 MG PO TABS: 5.0000 mg | ORAL_TABLET | Freq: Every day | ORAL | Status: DC

## 2024-05-06 MED ORDER — PREDNISONE 5 MG PO TABS: 15.0000 mg | ORAL_TABLET | Freq: Every day | ORAL | Status: DC

## 2024-05-06 MED ORDER — PREDNISONE 20 MG PO TABS
20.0000 mg | ORAL_TABLET | Freq: Every day | ORAL | Status: DC
Start: 2024-05-10 — End: 2024-05-09
  Filled 2024-05-06: qty 1

## 2024-05-06 MED ORDER — FUROSEMIDE 10 MG/ML IJ SOLN
60.0000 mg | Freq: Every day | INTRAMUSCULAR | Status: DC
  Administered 2024-05-07 – 2024-05-10 (×4): 60 mg via INTRAVENOUS
  Filled 2024-05-06 (×4): qty 6

## 2024-05-06 NOTE — Progress Notes (Signed)
 Luis [name]  FMW:[id number] DOB: 01/06/1941 DOA: 04/07/2024 PCP: [name], MD    Brief Narrative:  83 year old with a history of metastatic colon cancer to the lungs liver and lymph nodes followed at [facility] since 2019, pulmonary sarcoidosis on chronic steroids, COPD, CAD, chronic diastolic CHF, HTN, HLD, and hypothyroidism who was admitted to the hospital 10/27 after presenting with progressive onset of worsening shortness of breath x 1 week.  Significant Events: 10/31 S/P bronchoscopy BAL culture no growth to date AFB fungus PJP DFA pending. Cytology atypical cells. Continue with prednisone  taper. 11/13: Mildly elevated blood pressure, still significant transaminitis-message sent to Dr. Clancy cardiologist regarding Multaq . Had 1 episode of bleeding while having bowel movement-history of hemorrhoid. 11/14: Hemodynamically stable, hemoglobin with some decreased 11.5, improving creatinine.  Becoming little more short of breath and hypoxic when stood for standing weight.  Giving 1 dose of IV Lasix  at 20 mg and restarting home Lasix . Pending formal cardiology consult regarding the use of Multaq . 11/15: Blood pressure started trending up so holding midodrine  and placed parameters.  Multaq  was discontinued by cardiology and if he goes back in a flutter, he will need DCCV. 11/17 patient very weak and deconditioned  11/18 continue to have dyspnea.   Goals of Care:   Code Status: Full Code   DVT prophylaxis: Place TED hose Start: 04/19/24 0741 apixaban  (ELIQUIS ) tablet 5 mg   Interim Hx: No acute events recorded overnight.  Afebrile.  Some mild tachycardia appreciated in the last 24 hours with heart rate 97-105.  Continues to require HHFNC with sats 89-95%.  Feeling somewhat better today.  We had a long discussion today and the patient told me he feels he has a good quality of life that he wishes to extend it as far as possible.  He feels that he still has a lot of fight left in him and  wishes to continue his active treatment.  He remains hopeful that he will be able to continue to slowly improve.  He denies chest pain nausea vomiting or abdominal pain today.  Assessment & Plan:  Acute bronchospastic COPD exacerbation - acute on chronic hypoxic respiratory failure Continue inhaled medications as well as systemic steroids on prolonged slow taper - continue flutter valve and incentive spirometer -experiencing repetitive pattern of slow gradual improvement with occasional days of acute worsening mixed in - once patient can be weaned to conventional nasal cannula he will be a candidate for discharge to a rehab facility -at present his respiratory status continues to wax and wane -patient makes it clear he continues to desire ongoing medical care   Community-acquired pneumonia Patchy infiltrates on the left with right basilar consolidation as well - BAL with greater than 50% neutrophils consistent with bacterial infection - cultures unrevealing due to preceding antibiotics - completed a 14-day course of antibiotic therapy - f/u CXR 11/20 w/o new findings   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 Continue systemic steroid tx with plan for slow prolonged prednisone  taper as per Pulmonary [Prednisone  30 mg daily for 7 days, Prednisone  20 mg daily for 7 days, Prednisone  15 mg daily for 7 days, Prednisone  10 mg daily for 7 days, Prednisone  5 mg daily for 7 days, then stop] - as noted above progress is marked by staggered moments of acute worsening   Pneumomediastinum Spontaneous and minimal without need for acute intervention  Acute on chronic diastolic CHF exacerbation TTE noted EF 70-74% with grade 1 DD -continue twice daily Lasix  -  limited medical therapy due to risk of hypotension/orthostasis - Cardiology following - net negative ~28L since admit - Cards added metolazone  11/20 - ongoing diuresis appears to be significantly contributing to his respiratory  improvement  Filed Weights   05/02/24 0355 05/03/24 0500 05/06/24 0607  Weight: 86.4 kg 86.7 kg 81.5 kg    CAD Asymptomatic  Chronic atrial flutter Continue anticoagulation - Multaq  discontinued by Cardiology - if RVR recurs will need DCCV - heart rate reasonably controlled at present  HTN Has required midodrine  for blood pressure support during this admission - blood pressure presently stable with midodrine  support  Hypokalemia Due to use of diuretic - corrected with supplementation  Hypomagnesemia Due to use of diuretic - supplement further with goal of 2.0  Chronic anemia Hemoglobin stable at present  DM2 Reasonably well-controlled at present  Hyperlipidemia Continue usual medical therapy  Hypothyroidism Continue usual levothyroxine  dose  Colon cancer Appears stable at present    Family Communication: Spoke with wife at bedside she is Disposition:   Skilled Nursing-Short Term Rehab (<3 Hours/Day)05/05/2024 1348  Objective: Blood pressure 135/77, pulse (!) 101, temperature (!) 97.5 F (36.4 C), temperature source Oral, resp. rate 17, height 5' 11 (1.803 m), weight 81.5 kg, SpO2 93%.  Intake/Output Summary (Last 24 hours) at 05/06/2024 1012 Last data filed at 05/05/2024 2311 Gross per 24 hour  Intake 60 ml  Output 600 ml  Net -540 ml   Filed Weights   05/02/24 0355 05/03/24 0500 05/06/24 0607  Weight: 86.4 kg 86.7 kg 81.5 kg    Examination: General: No acute respiratory distress at the time of my visit Lungs: Fine crackles throughout all fields - no wheezing with good air movement bilaterally on heated high flow nasal cannula Cardiovascular: Regular rate and rhythm without murmur  Abdomen: Nontender, nondistended, soft, bowel sounds positive, no rebound Extremities: Trace edema B LE with Unna boots in place bilaterally  CBC: Recent Labs  Lab 05/01/24 0600 05/02/24 0440 05/05/24 0500  WBC 10.3 10.5 10.0  HGB 11.3* 10.9* 9.7*  HCT 35.9* 33.7*  31.3*  MCV 94.0 93.1 95.1  PLT 107* 87* 94*   Basic Metabolic Panel: Recent Labs  Lab 05/02/24 0440 05/03/24 0530 05/04/24 0325 05/05/24 0500  NA 131* 135 135 136  K 3.1* 3.6 3.6 3.8  CL 87* 87* 87* 86*  CO2 33* 37* 42* 38*  GLUCOSE 170* 111* 162* 151*  BUN 22 21 19 19   CREATININE 0.78 1.02 0.78 0.73  CALCIUM  8.3* 8.3* 8.1* 8.1*  MG 1.5* 1.9  --  1.6*  PHOS 4.0  --   --  3.0   GFR: Estimated Creatinine Clearance: 74.5 mL/min (by C-G formula based on SCr of 0.73 mg/dL).   Scheduled Meds:  apixaban   5 mg Oral BID   benzonatate   100 mg Oral TID   budesonide -glycopyrrolate -formoterol   2 puff Inhalation BID   Chlorhexidine  Gluconate Cloth  6 each Topical Daily   dextromethorphan   30 mg Oral BID   docusate sodium   100 mg Oral BID   furosemide   60 mg Intravenous BID   gabapentin   200 mg Oral QHS   guaiFENesin   600 mg Oral BID   insulin  aspart  0-15 Units Subcutaneous TID WC   levothyroxine   50 mcg Oral Q0600   midodrine   10 mg Oral TID WC   minocycline   100 mg Oral Daily   montelukast   10 mg Oral QHS   pantoprazole   40 mg Oral BID   potassium chloride   40  mEq Oral BID   predniSONE   30 mg Oral Q breakfast   sodium chloride   2 spray Each Nare TID   sodium chloride  flush  10-40 mL Intracatheter Q12H   sucralfate   1 g Oral BID   sulfamethoxazole -trimethoprim   1 tablet Oral Once per day on Monday Wednesday Friday      LOS: 29 days   Reyes IVAR Moores, MD [facility] Office  [phone] Pager - Text Page per Tracey  If 7PM-7AM, please contact night-coverage per Amion 05/06/2024, 10:12 AM

## 2024-05-06 NOTE — Progress Notes (Signed)
 Patient ID: Luis [name], male   DOB: Jun 25, 1940, 83 y.o.   MRN: [id number]    Progress Note from the Palliative Medicine Team at [facility]   Patient Name: Luis [name]        Date: 05/06/2024 DOB: [birth year]  Age: 83 y.o. MRN#: [id number] Attending Physician: Danton Reyes DASEN, MD Primary Care Physician: [name], MD Admit Date: 04/07/2024   Reason for Consultation/Follow-up   Establishing Goals of Care   HPI/ Brief Hospital Review  83 y.o. male   admitted on 04/07/2024 PMH of metastatic colon cancer with metasteses to lungs/ liver, sarcoidosis, COPD, CAD s/p PCI, HFpEF, aflutter s/p cardioversion, hypertension, HLD, hypothyroidism that presented with progressive worsening of SOB for a week with chest congestion and blood tinged sputum production.    Admitted for treatment and stabilization.   Patient and family continue to work closely with TOC for next steps in transition of care, fortunately patient has not stabilized enough medically to realistically be able to tolerate SNF for rehab at this time   Patient and family face treatment option decisions, advanced directive decisions and anticipatory care needs.   Subjective  Extensive chart review has been completed prior to meeting with patient/family  including labs, vital signs, progress/consult notes, orders, medications and available advance directive documents.   This NP assessed patient at the bedside as a follow up for palliative medicine needs an emotional support, initially no family at bedside.    Patient appears weak, dyspneic at rest.  No family at bedside  Discussed effectiveness of low-dose/prn opioid/ Roxanol  use for dyspnea.   Patient reports it has helped a lot  Continued education with Mr. Monday regarding the seriousness of his current medical situation.    I again verbalized my concern for his long-term poor prognosis secondary to his multiple comorbidities and an unfortunate lack of  progress within the context of a full medical support treatment plan.  Education offered on the difference between a full medical support path attempting to prolong life and a palliative path focusing on comfort and dignity, allowing for natural death.  Patient verbalizes an understanding, he tells me what will be will be, for now he remains open to all offered and available medical interventions to prolong life and hopes for improvement.   Education offered today regarding  the importance of continued conversation with his wife and the  medical providers regarding overall plan of care and treatment options,  ensuring decisions are within the context of the patients values and GOCs.      -  Raised awareness to CODE STATUS     -   Educated patient to consider DNR/DNI status understanding evidenced based poor outcomes in similar hospitalized patient, as the cause of arrest is likely associated with advanced chronic illness rather than an easily reversible acute cardio-pulmonary event.  Questions and concerns addressed   Discussed with primary team and nursing staff  PMT will continue to support holistically   Time:  50 minutes  Detailed review of medical records ( labs, imaging, vital signs), medically appropriate exam ( MS, skin, cardiac,  resp)   discussed with treatment team, counseling and education to patient, family, staff, documenting clinical information, medication management, coordination of care    Ronal Plants NP  Palliative Medicine Team Team Phone # [phone] Pager [phone]

## 2024-05-06 NOTE — Progress Notes (Signed)
 OT Cancellation Note  Patient Details Name: Luis [name] MRN: [id number] DOB: 10-21-1940   Cancelled Treatment:    Reason Eval/Treat Not Completed: Fatigue/lethargy limiting ability to participate (Pt reported he has been up all morning and needs to rest. Will follow up as able.)  Warrick POUR OTR/L  Acute Rehab Services  [phone] office number   Warrick Berber 05/06/2024, 10:44 AM

## 2024-05-06 NOTE — Progress Notes (Signed)
 PRN Morphine  5 mg administered PO for SOB, Spo2 fluctuating from low 80s to low 90s, message left with respiratory to follow up regarding pt current status.

## 2024-05-06 NOTE — Progress Notes (Signed)
  Progress Note  Patient Name: Luis [name] Date of Encounter: 05/06/2024 [facility] Cardiologist: [name], MD   Interval Summary   Resting in the chair Reports feeling better with PRN morphine   Less UOP, weight down, pending AM labs Will reduce Lasix  to once daily but may reduce further pending renal function Patient was able to stand and support his weight today, improved from yesterday  Vital Signs Vitals:   05/06/24 0416 05/06/24 0607 05/06/24 0815 05/06/24 0822  BP:    135/77  Pulse: 97 (!) 105 100 (!) 101  Resp: (!) 24 (!) 22 (!) 24 17  Temp:    (!) 97.5 F (36.4 C)  TempSrc:    Oral  SpO2: (!) 86% 90% 93% 93%  Weight:  81.5 kg    Height:        Intake/Output Summary (Last 24 hours) at 05/06/2024 1020 Last data filed at 05/05/2024 2311 Gross per 24 hour  Intake 60 ml  Output 600 ml  Net -540 ml      05/06/2024    6:07 AM 05/03/2024    5:00 AM 05/02/2024    3:55 AM  Last 3 Weights  Weight (lbs) 179 lb 11.2 oz 191 lb 2.2 oz 190 lb 7.6 oz  Weight (kg) 81.511 kg 86.7 kg 86.4 kg     Physical Exam  GEN: No acute distress, presently on HHFNC.   Neck: No JVD Cardiac: mildly tachycardic.  Respiratory: diffuse coarse breath sounds, coughing. GI: Soft, nontender, non-distended  MS: LE wrapped   Assessment & Plan   Acute on chronic HFpEF Orthostatic hypotension  Presented with worsening shortness of breath Orthostatic hypotension limits GDMT and aggressive diuresis  Urine output 600 cc yesterday, net -16.2 L this admission Pending labs  Low albumin  on labs Weight 81.5 kg today, compared to 86.7 kg 11/22  Currently on IV Lasix  60 mg BID -- with weight and decreased UOP may need to adjust, pending AM labs Currently requiring midodrine  10 mg TID    Paroxysmal atrial fibrillation  Bifascicular block Underwent DCCV 01/2024 Rates maintaining 90-100s Currently on Eliquis  5 mg BID If he were to return to a persistent rapid rhythm, he would likely  require DCCV   CAD s/p PCI to ramus, LAD, diagonal  Hyperlipidemia  Denies any chest pain  Home statin and Zetia  held in setting of elevated LFTs   Acute hypoxic respiratory failure Acute bronchospastic COPD exacerbation Community acquired PNA Pulmonary sarcoidosis Pneumomediastinum Shortness of breath clearly multifactorial as above  Presently on 45 L via HHFNC Continue with treatment of inhalers/steroids and antibiotics per primary Started on morphine  for air hunger per palliative medicine    Metastatic colon cancer Noted to have metastatic cancer to liver, lungs S/p liver resection, chemotherapy  LFTs remain elevated Seen by palliative medicine this admission Remains full code Patient is hopeful to discharge to SNF and then return home Started on morphine  for air hunger yesterday    For questions or updates, please contact [facility] Please consult [email or web address] for contact info under        Signed, Waddell DELENA Donath, PA-C

## 2024-05-07 ENCOUNTER — Other Ambulatory Visit (HOSPITAL_COMMUNITY): Payer: Self-pay

## 2024-05-07 DIAGNOSIS — I480 Paroxysmal atrial fibrillation: Secondary | ICD-10-CM | POA: Diagnosis not present

## 2024-05-07 DIAGNOSIS — I5033 Acute on chronic diastolic (congestive) heart failure: Secondary | ICD-10-CM | POA: Diagnosis not present

## 2024-05-07 DIAGNOSIS — J441 Chronic obstructive pulmonary disease with (acute) exacerbation: Secondary | ICD-10-CM | POA: Diagnosis not present

## 2024-05-07 LAB — RENAL FUNCTION PANEL
Albumin: 2.1 g/dL — ABNORMAL LOW (ref 3.5–5.0)
Anion gap: 11 (ref 5–15)
BUN: 28 mg/dL — ABNORMAL HIGH (ref 8–23)
CO2: 35 mmol/L — ABNORMAL HIGH (ref 22–32)
Calcium: 8.9 mg/dL (ref 8.9–10.3)
Chloride: 87 mmol/L — ABNORMAL LOW (ref 98–111)
Creatinine, Ser: 0.96 mg/dL (ref 0.61–1.24)
GFR, Estimated: >60 mL/min (ref >60)
Glucose, Bld: 206 mg/dL — ABNORMAL HIGH (ref 70–99)
Phosphorus: 3.3 mg/dL (ref 2.5–4.6)
Potassium: 5 mmol/L (ref 3.5–5.1)
Sodium: 133 mmol/L — ABNORMAL LOW (ref 135–145)

## 2024-05-07 LAB — CBC
HCT: 36.4 % — ABNORMAL LOW (ref 39.0–52.0)
Hemoglobin: 11.1 g/dL — ABNORMAL LOW (ref 13.0–17.0)
MCH: 29.1 pg (ref 26.0–34.0)
MCHC: 30.5 g/dL (ref 30.0–36.0)
MCV: 95.3 fL (ref 80.0–100.0)
Platelets: 134 K/uL — ABNORMAL LOW (ref 150–400)
RBC: 3.82 MIL/uL — ABNORMAL LOW (ref 4.22–5.81)
RDW: 18.6 % — ABNORMAL HIGH (ref 11.5–15.5)
WBC: 14.1 K/uL — ABNORMAL HIGH (ref 4.0–10.5)
nRBC: 0 % (ref 0.0–0.2)

## 2024-05-07 LAB — GLUCOSE, CAPILLARY
Glucose-Capillary: 182 mg/dL — ABNORMAL HIGH (ref 70–99)
Glucose-Capillary: 186 mg/dL — ABNORMAL HIGH (ref 70–99)
Glucose-Capillary: 195 mg/dL — ABNORMAL HIGH (ref 70–99)
Glucose-Capillary: 197 mg/dL — ABNORMAL HIGH (ref 70–99)
Glucose-Capillary: 233 mg/dL — ABNORMAL HIGH (ref 70–99)
Glucose-Capillary: 267 mg/dL — ABNORMAL HIGH (ref 70–99)

## 2024-05-07 LAB — MAGNESIUM: Magnesium: 1.8 mg/dL (ref 1.7–2.4)

## 2024-05-07 MED ORDER — INSULIN ASPART 100 UNIT/ML IJ SOLN
2.0000 [IU] | Freq: Three times a day (TID) | INTRAMUSCULAR | Status: DC
  Administered 2024-05-08 – 2024-05-10 (×3): 2 [IU] via SUBCUTANEOUS
  Filled 2024-05-07 (×4): qty 2

## 2024-05-07 NOTE — Progress Notes (Signed)
 Physical Therapy Treatment Patient Details Name: Luis [name] MRN: [id number] DOB: 18-May-1941 Today's Date: 05/07/2024   History of Present Illness 83 y.o. male adm 04/07/2024 with progressive SOB, PNA, multifactorial respiratory failure. 10/31 bronch. PMHx: metastatic colon CA, sarcoidosis, COPD, CAD, HFpEF, HTN, HLD, hypothyroidism.    PT Comments  Pt received in supine needing encouragement for participation. Pts wife stated he's had 2 days of visitors and he is tired.  Pt agreeable to sit EOB, requires up to mod A for bed mobility. Pt desat to 77% on 25L HHFNC, increased to 30L and pt was satting 78-82%, RN notified and increase to 40L, when pt was stable RN and PTA total A got pt back in supine and decreased to 35L HHFNC. Pt requires increased time to complete task and becomes frustrated with redirection. Patient will benefit from continued inpatient follow up therapy, <3 hours/day.    If plan is discharge home, recommend the following: A lot of help with walking and/or transfers;A lot of help with bathing/dressing/bathroom;Assistance with cooking/housework;Assist for transportation;Help with stairs or ramp for entrance   Can travel by private vehicle     No  Equipment Recommendations  Wheelchair cushion (measurements PT);Wheelchair (measurements PT);BSC/3in1    Recommendations for Other Services       Precautions / Restrictions Precautions Precautions: Fall;Other (comment) Recall of Precautions/Restrictions: Intact Precaution/Restrictions Comments: Monitor SpO2, othrostatic with standing Restrictions Weight Bearing Restrictions Per Provider Order: No     Mobility  Bed Mobility Overal bed mobility: Needs Assistance Bed Mobility: Supine to Sit, Sit to Supine     Supine to sit: Mod assist, +2 for physical assistance, HOB elevated, Used rails Sit to supine: HOB elevated, +2 for physical assistance, Total assist (Avoiding further exertion or desatting of SpO2.)   General  bed mobility comments: Pt requires cues for sequencing and needs increased time, agitated when redirected. Due to pt desatting and fatigue +2 max to get back supine from EOB to reduce further exertion from pt.    Transfers                   General transfer comment: Defer due to oxygen levels    Ambulation/Gait               General Gait Details: Unable to attempt due to respiratory issues   Stairs             Wheelchair Mobility     Tilt Bed    Modified Rankin (Stroke Patients Only)       Balance Overall balance assessment: Needs assistance Sitting-balance support: Feet supported Sitting balance-Leahy Scale: Good Sitting balance - Comments: EOB without support                                    Communication Communication Communication: No apparent difficulties Factors Affecting Communication: Other (comment) (Pt has difficulty speaking during/after exertion without SpO2 desatting)  Cognition Arousal: Alert Behavior During Therapy: Agitated, Flat affect, Anxious   PT - Cognitive impairments: Problem solving, Safety/Judgement, Sequencing, Memory, Awareness                       PT - Cognition Comments: Pt stated he was up in recliner today but to our knowledge he has not been up, maybe thinking of previous day. Following commands: Impaired Following commands impaired: Follows one step commands with increased time  Pt responded better  to male RN with verbal cues and may benefit from a male therapist next session to see if this improves receptiveness to postural and body mechanic cues.   Cueing Cueing Techniques: Verbal cues, Gestural cues, Visual cues, Tactile cues  Exercises      General Comments General comments (skin integrity, edema, etc.): Pt on 25L HHFNC upon arrival, increased to 30L once EOB due to desat to 77-82%, RN notified and increeased to 40L sats increased to 88%. After stable pt was total A bed to supine to  prevent further exertion and prior to leaving room RN decreased pt to 35L HHFNC. Pt SpO2 94% when PTA/SPTA left room. BP 140/87 (102) HR 96. Max HR with exertion 104.      Pertinent Vitals/Pain Pain Assessment Pain Assessment: Faces Faces Pain Scale: Hurts even more Pain Location: Chest Pain Descriptors / Indicators: Discomfort Pain Intervention(s): Limited activity within patient's tolerance, Monitored during session, Repositioned    Home Living                          Prior Function            PT Goals (current goals can now be found in the care plan section) Acute Rehab PT Goals Patient Stated Goal: Improve activity tolerance and be able to ambulate. PT Goal Formulation: With patient/family Time For Goal Achievement: 05/19/24 Progress towards PT goals: Progressing toward goals    Frequency    Min 2X/week      PT Plan      Co-evaluation              AM-PAC PT 6 Clicks Mobility   Outcome Measure  Help needed turning from your back to your side while in a flat bed without using bedrails?: A Little Help needed moving from lying on your back to sitting on the side of a flat bed without using bedrails?: A Lot Help needed moving to and from a bed to a chair (including a wheelchair)?: A Lot Help needed standing up from a chair using your arms (e.g., wheelchair or bedside chair)?: A Lot Help needed to walk in hospital room?: Total Help needed climbing 3-5 steps with a railing? : Total 6 Click Score: 11    End of Session Equipment Utilized During Treatment: Oxygen Activity Tolerance: Treatment limited secondary to medical complications (Comment) (SpO2 77% increased to 40L HHFNC) Patient left: in bed;with bed alarm set;with family/visitor present;with call bell/phone within reach Nurse Communication: Mobility status;Other (comment) (oxygen desat) PT Visit Diagnosis: Other abnormalities of gait and mobility (R26.89);Difficulty in walking, not elsewhere  classified (R26.2);Muscle weakness (generalized) (M62.81)     Time: 8474-8447 PT Time Calculation (min) (ACUTE ONLY): 27 min  Charges:    $Therapeutic Activity: 23-37 mins PT General Charges $$ ACUTE PT VISIT: 1 Visit                     Luis [name] Luis [name] 05/07/2024, 4:39 PM

## 2024-05-07 NOTE — Progress Notes (Signed)
 PROGRESS NOTE    Luis [name]  FMW:[id number] DOB: [birth year] DOA: 04/07/2024 PCP: [name], MD  Chief Complaint  Patient presents with   Respiratory Distress    Brief Narrative:   83 year old with Luis [name] history of metastatic colon cancer to the lungs liver and lymph nodes followed at [facility] since 2019, pulmonary sarcoidosis on chronic steroids, COPD, CAD, chronic diastolic CHF, HTN, HLD, and hypothyroidism who was admitted to the hospital 10/27 after presenting with progressive onset of worsening shortness of breath x 1 week.   Significant Events: 10/31 S/P bronchoscopy BAL culture no growth to date AFB fungus PJP DFA pending. Cytology atypical cells. Continue with prednisone  taper. 11/13: Mildly elevated blood pressure, still significant transaminitis-message sent to Dr. Clancy cardiologist regarding Multaq . Had 1 episode of bleeding while having bowel movement-history of hemorrhoid. 11/14: Hemodynamically stable, hemoglobin with some decreased 11.5, improving creatinine.  Becoming little more short of breath and hypoxic when stood for standing weight.  Giving 1 dose of IV Lasix  at 20 mg and restarting home Lasix . Pending formal cardiology consult regarding the use of Multaq . 11/15: Blood pressure started trending up so holding midodrine  and placed parameters.  Multaq  was discontinued by cardiology and if he goes back in Luis [name] flutter, he will need DCCV. 11/17 patient very weak and deconditioned  11/18 continue to have dyspnea.   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 Community Acquired Pneumonia  Currently on HHFNC at 45 L Recent CXR with bilateral patchy airspace opacities in L lung and R lung base, small bilateral effusions Steroid taper per pulmonary (sign off note 11/13, this was before he was on [facility], will consider discussion again with them) Scheduled and prn nebs/inhalers Completed 14 day course of abx for pneumonia   Pneumomediastinum Spontaneous and minimal without need for acute intervention   Acute on chronic diastolic CHF exacerbation TTE noted EF 70-74% with grade 1 DD -continue twice daily Lasix  - limited medical therapy due to risk of hypotension/orthostasis - Cardiology following - diuresis per cardiology - net negative ~14L since admit   CAD Asymptomatic   Chronic atrial flutter Continue anticoagulation - Multaq  discontinued by Cardiology - if RVR recurs will need DCCV - heart rate reasonably controlled at present  Elevated LFT's Trend   HTN Has required midodrine  for blood pressure support during this admission - blood pressure presently stable with midodrine  support   Hypokalemia Due to use of diuretic - corrected with supplementation   Hypomagnesemia Due to use of diuretic - supplement further with goal of 2.0   Chronic anemia Hemoglobin stable at present   DM2 Reasonably well-controlled at present   Hyperlipidemia Continue usual medical therapy   Hypothyroidism Continue usual levothyroxine  dose   Colon cancer Appears stable at present - followed by [facility]    DVT prophylaxis: eliquis  Code Status: full Family Communication: none Disposition:   Status is: Inpatient Remains inpatient appropriate because: need for continued inpatient care, continued respiratory failure   Consultants:  Palliative care Cardiology Pulmonary   Procedures:  Echo IMPRESSIONS  1. Left ventricular ejection fraction, by estimation, is 70 to  75%. The  left ventricle has hyperdynamic function. The left ventricle has no  regional wall motion abnormalities. Left ventricular diastolic parameters  are consistent with Grade I diastolic  dysfunction (impaired relaxation).   2. Right ventricular systolic function is normal. The right ventricular  size is normal.   3. The mitral valve is degenerative. No evidence of mitral valve  regurgitation.   4. Difficult assessment for aortic stenosis, poor LVOT Doppler  evaluation. Consider future dedicated aortic stenosis study as outpatient.  The aortic valve was not well visualized. Aortic valve regurgitation is  not visualized. Aortic valve sclerosis is  present, with no evidence of aortic valve stenosis.   5. The inferior vena cava is normal in size with greater than 50%  respiratory variability, suggesting right atrial pressure of 3 mmHg.   Comparison(s): Prior images reviewed side by side. Function is more  dynamic.   Antimicrobials:  Anti-infectives (From admission, onward)    Start     Dose/Rate Route Frequency Ordered Stop   04/30/24 0900  sulfamethoxazole -trimethoprim  (BACTRIM ) 400-80 MG per tablet 1 tablet        1 tablet Oral Once per day on Monday Wednesday Friday 04/29/24 1649     04/21/24 1000  minocycline  (MINOCIN ) capsule 100 mg        100 mg Oral Daily 04/15/24 1245     04/13/24 2200  doxycycline  (VIBRA -TABS) tablet 100 mg        100 mg Oral Every 12 hours 04/13/24 1241 04/20/24 2227   04/12/24 1800  cefTRIAXone  (ROCEPHIN ) 2 g in sodium chloride  0.9 % 100 mL IVPB        2 g 200 mL/hr over 30 Minutes Intravenous Every 24 hours 04/12/24 1655 04/20/24 1739   04/12/24 1800  doxycycline  (VIBRAMYCIN ) 100 mg in sodium chloride  0.9 % 250 mL IVPB  Status:  Discontinued        100 mg 125 mL/hr over 120 Minutes Intravenous Every 12 hours 04/12/24 1655 04/13/24 1240   04/08/24 1000  azithromycin  (ZITHROMAX ) tablet 500 mg        500 mg Oral Daily 04/07/24 0238 04/09/24 0852    04/08/24 0000  cefTRIAXone  (ROCEPHIN ) 1 g in sodium chloride  0.9 % 100 mL IVPB        1 g 200 mL/hr over 30 Minutes Intravenous Every 24 hours 04/07/24 0238 04/11/24 0815   04/07/24 1000  minocycline  (MINOCIN ) capsule 100 mg  Status:  Discontinued        100 mg Oral Daily 04/07/24 0241 04/15/24 1204   04/07/24 0130  cefTRIAXone  (ROCEPHIN ) 2 g in sodium chloride  0.9 % 100 mL IVPB        2 g 200 mL/hr over 30 Minutes Intravenous  Once 04/07/24 0124 04/07/24 0255   04/07/24 0130  azithromycin  (ZITHROMAX ) 500 mg in sodium chloride  0.9 % 250 mL IVPB        500 mg 250 mL/hr over 60 Minutes Intravenous  Once 04/07/24 0124 04/07/24 0337       Subjective: No new complaints He notes he was just trying to rest  Objective: Vitals:   05/07/24 0831 05/07/24 1202 05/07/24 1333 05/07/24 1620  BP: 113/76 (!) 143/80  131/77  Pulse: 96 94 99 88  Resp: (!) 21 (!) 22 (!) 24 (!) 26  Temp: 97.8 F (36.6 C) 98.1 F (36.7 C)  98.1 F (36.7 C)  TempSrc: Oral Oral  Oral  SpO2: 92%  92% 92% 98%  Weight:      Height:        Intake/Output Summary (Last 24 hours) at 05/07/2024 1757 Last data filed at 05/07/2024 1621 Gross per 24 hour  Intake 10 ml  Output 750 ml  Net -740 ml   Filed Weights   05/02/24 0355 05/03/24 0500 05/06/24 0607  Weight: 86.4 kg 86.7 kg 81.5 kg    Examination:  General exam: stable, but ill appearing, tired Respiratory system: diminished anterior exam, no increased WOB Cardiovascular system: RRR Gastrointestinal system: Abdomen is nondistended, soft and nontender.  Central nervous system: Alert and oriented. No focal neurological deficits. Extremities: no LEE    Data Reviewed: I have personally reviewed following labs and imaging studies  CBC: Recent Labs  Lab 05/01/24 0600 05/02/24 0440 05/05/24 0500 05/06/24 1105 05/07/24 0530  WBC 10.3 10.5 10.0 15.6* 14.1*  HGB 11.3* 10.9* 9.7* 11.1* 11.1*  HCT 35.9* 33.7* 31.3* 35.6* 36.4*  MCV 94.0 93.1 95.1 96.0  95.3  PLT 107* 87* 94* 123* 134*    Basic Metabolic Panel: Recent Labs  Lab 05/02/24 0440 05/03/24 0530 05/04/24 0325 05/05/24 0500 05/06/24 1105 05/07/24 0530  NA 131* 135 135 136 131* 133*  K 3.1* 3.6 3.6 3.8 5.0 5.0  CL 87* 87* 87* 86* 87* 87*  CO2 33* 37* 42* 38* 35* 35*  GLUCOSE 170* 111* 162* 151* 187* 206*  BUN 22 21 19 19 22  28*  CREATININE 0.78 1.02 0.78 0.73 0.97 0.96  CALCIUM  8.3* 8.3* 8.1* 8.1* 8.4* 8.9  MG 1.5* 1.9  --  1.6* 1.7 1.8  PHOS 4.0  --   --  3.0 3.3 3.3    GFR: Estimated Creatinine Clearance: 62.1 mL/min (by C-G formula based on SCr of 0.96 mg/dL).  Liver Function Tests: Recent Labs  Lab 05/01/24 0600 05/05/24 0500 05/06/24 1105 05/07/24 0530  AST 76*  --   --   --   ALT 70*  --   --   --   ALKPHOS 1,027*  --   --   --   BILITOT 1.3*  --   --   --   PROT 5.0*  --   --   --   ALBUMIN  2.2* 1.8* 2.0* 2.1*    CBG: Recent Labs  Lab 05/07/24 0024 05/07/24 0622 05/07/24 0833 05/07/24 1216 05/07/24 1639  GLUCAP 267* 197* 186* 233* 195*     No results found for this or any previous visit (from the past 240 hours).       Radiology Studies: No results found.      Scheduled Meds:  apixaban   5 mg Oral BID   benzonatate   100 mg Oral TID   budesonide -glycopyrrolate -formoterol   2 puff Inhalation BID   Chlorhexidine  Gluconate Cloth  6 each Topical Daily   dextromethorphan   30 mg Oral BID   docusate sodium   100 mg Oral BID   furosemide   60 mg Intravenous Daily   gabapentin   200 mg Oral QHS   guaiFENesin   600 mg Oral BID   insulin  aspart  0-15 Units Subcutaneous TID WC   levothyroxine   50 mcg Oral Q0600   midodrine   10 mg Oral TID WC   minocycline   100 mg Oral Daily   montelukast   10 mg Oral QHS   pantoprazole   40 mg Oral BID   predniSONE   30 mg Oral Q breakfast   Followed by   NOREEN ON 05/10/2024] predniSONE   20 mg Oral Q breakfast  Followed by   NOREEN ON 05/17/2024] predniSONE   15 mg Oral Q breakfast   Followed by    NOREEN ON 05/24/2024] predniSONE   10 mg Oral Q breakfast   Followed by   NOREEN ON 05/31/2024] predniSONE   5 mg Oral Q breakfast   sodium chloride   2 spray Each Nare TID   sodium chloride  flush  10-40 mL Intracatheter Q12H   sucralfate   1 g Oral BID   sulfamethoxazole -trimethoprim   1 tablet Oral Once per day on Monday Wednesday Friday   Continuous Infusions:   LOS: 30 days    Time spent: over 30 min     Meliton Monte, MD [facility]   To contact the attending provider between 7A-7P or the covering provider during after hours 7P-7A, please log into the web site [email or web address] and access using universal George password for that web site. If you do not have the password, please call the hospital operator.  05/07/2024, 5:57 PM

## 2024-05-07 NOTE — Inpatient Diabetes Management (Addendum)
 Inpatient Diabetes Program Recommendations  AACE/ADA: New Consensus Statement on Inpatient Glycemic Control (2015)  Target Ranges:  Prepandial:   less than 140 mg/dL      Peak postprandial:   less than 180 mg/dL (1-2 hours)      Critically ill patients:  140 - 180 mg/dL   Lab Results  Component Value Date   GLUCAP 186 (H) 05/07/2024   HGBA1C 7.1 (H) 04/07/2024    Review of Glycemic Control  Latest Reference Range & Units 05/06/24 11:20 05/06/24 16:35 05/07/24 00:24 05/07/24 06:22 05/07/24 08:33  Glucose-Capillary 70 - 99 mg/dL 801 (H) 691 (H) 732 (H) 197 (H) 186 (H)   Diabetes history: DM Outpatient Diabetes medications:  None listed Current orders for Inpatient glycemic control:  Novolog  0-15 units tid with meals  Prednisone  taper-  Inpatient Diabetes Program Recommendations:    While on prednisone  taper, consider adding Novolog  meal coverage 2 units with meals (hold if patient eats less than 50% or NPO).   Thanks,  Randall Bullocks, RN, BC-ADM Inpatient Diabetes Coordinator Pager [phone]  (8a-5p)

## 2024-05-07 NOTE — Progress Notes (Signed)
  Progress Note  Patient Name: Luis [name] Date of Encounter: 05/07/2024 [facility] Cardiologist: [name], MD   Interval Summary   Sitting up in chair Oxygen at 25 L via [facility] Family present in room Reports feeling much better Discussed decreasing Lasix , patient okay with this plan Renal function stable. Incomplete I&O's  Vital Signs Vitals:   05/07/24 0158 05/07/24 0418 05/07/24 0734 05/07/24 0831  BP:  (!) 150/91  113/76  Pulse: 88 89 (!) 103 96  Resp: (!) 28 18 (!) 23 (!) 21  Temp:    97.8 F (36.6 C)  TempSrc:  Oral  Oral  SpO2:  96% 92% 92%  Weight:      Height:        Intake/Output Summary (Last 24 hours) at 05/07/2024 0957 Last data filed at 05/06/2024 2145 Gross per 24 hour  Intake 10 ml  Output --  Net 10 ml      05/06/2024    6:07 AM 05/03/2024    5:00 AM 05/02/2024    3:55 AM  Last 3 Weights  Weight (lbs) 179 lb 11.2 oz 191 lb 2.2 oz 190 lb 7.6 oz  Weight (kg) 81.511 kg 86.7 kg 86.4 kg     Physical Exam  GEN: No acute distress.   Neck: No JVD Cardiac: mildly tachycardic, no murmurs, rubs, or gallops.  Respiratory: diffuse coarse breath sounds, coughing . GI: Soft, nontender, non-distended  MS: LE wrapped bilaterally but still 2+ edema noted   Assessment & Plan   Acute on chronic HFpEF Orthostatic hypotension  Presented with worsening shortness of breath Orthostatic hypotension limits GDMT and aggressive diuresis  No I&O's or weights charted last 24h Low albumin  on labs Weight 81.5 kg yesterday, compared to 86.7 kg 11/22  Renal function stable Currently on IV Lasix  60 mg daily Currently requiring midodrine  10 mg TID  Continue with strict I&O's, daily weights, daily BMPs   Paroxysmal atrial fibrillation  Bifascicular block Underwent DCCV 01/2024 Rates maintaining 90-100s Currently on Eliquis  5 mg BID If he were to return to a persistent rapid rhythm, he would likely require DCCV   CAD s/p PCI to ramus, LAD, diagonal   Hyperlipidemia  Denies any chest pain  Home statin and Zetia  held in setting of elevated LFTs   Acute hypoxic respiratory failure Acute bronchospastic COPD exacerbation Community acquired PNA Pulmonary sarcoidosis Pneumomediastinum Shortness of breath clearly multifactorial as above  Presently on 25 L via HHFNC Continue with treatment of inhalers/steroids and antibiotics per primary Started on morphine  for air hunger per palliative medicine    Metastatic colon cancer Noted to have metastatic cancer to liver, lungs S/p liver resection, chemotherapy  LFTs remain elevated Seen by palliative medicine this admission Remains full code Patient is hopeful to discharge to SNF and then return home Started on morphine  for air hunger yesterday  For questions or updates, please contact  [facility] Please consult [email or web address] for contact info under   Signed, Waddell DELENA Donath, PA-C

## 2024-05-08 ENCOUNTER — Inpatient Hospital Stay (HOSPITAL_COMMUNITY)

## 2024-05-08 DIAGNOSIS — R609 Edema, unspecified: Secondary | ICD-10-CM | POA: Diagnosis not present

## 2024-05-08 DIAGNOSIS — J441 Chronic obstructive pulmonary disease with (acute) exacerbation: Secondary | ICD-10-CM | POA: Diagnosis not present

## 2024-05-08 LAB — BLOOD GAS, ARTERIAL
Acid-Base Excess: 13.3 mmol/L — ABNORMAL HIGH (ref 0.0–2.0)
Bicarbonate: 38.3 mmol/L — ABNORMAL HIGH (ref 20.0–28.0)
Drawn by: 53527
O2 Saturation: 88.8 %
Patient temperature: 37.1
pCO2 arterial: 48 mmHg (ref 32–48)
pH, Arterial: 7.51 — ABNORMAL HIGH (ref 7.35–7.45)
pO2, Arterial: 54 mmHg — ABNORMAL LOW (ref 83–108)

## 2024-05-08 LAB — RENAL FUNCTION PANEL
Albumin: 1.9 g/dL — ABNORMAL LOW (ref 3.5–5.0)
Anion gap: 11 (ref 5–15)
BUN: 24 mg/dL — ABNORMAL HIGH (ref 8–23)
CO2: 36 mmol/L — ABNORMAL HIGH (ref 22–32)
Calcium: 8.6 mg/dL — ABNORMAL LOW (ref 8.9–10.3)
Chloride: 86 mmol/L — ABNORMAL LOW (ref 98–111)
Creatinine, Ser: 0.81 mg/dL (ref 0.61–1.24)
GFR, Estimated: >60 mL/min (ref >60)
Glucose, Bld: 128 mg/dL — ABNORMAL HIGH (ref 70–99)
Phosphorus: 3.7 mg/dL (ref 2.5–4.6)
Potassium: 4.4 mmol/L (ref 3.5–5.1)
Sodium: 133 mmol/L — ABNORMAL LOW (ref 135–145)

## 2024-05-08 LAB — CBC
HCT: 32.2 % — ABNORMAL LOW (ref 39.0–52.0)
Hemoglobin: 9.9 g/dL — ABNORMAL LOW (ref 13.0–17.0)
MCH: 29.2 pg (ref 26.0–34.0)
MCHC: 30.7 g/dL (ref 30.0–36.0)
MCV: 95 fL (ref 80.0–100.0)
Platelets: 106 K/uL — ABNORMAL LOW (ref 150–400)
RBC: 3.39 MIL/uL — ABNORMAL LOW (ref 4.22–5.81)
RDW: 18.8 % — ABNORMAL HIGH (ref 11.5–15.5)
WBC: 13 K/uL — ABNORMAL HIGH (ref 4.0–10.5)
nRBC: 0 % (ref 0.0–0.2)

## 2024-05-08 LAB — GLUCOSE, CAPILLARY
Glucose-Capillary: 124 mg/dL — ABNORMAL HIGH (ref 70–99)
Glucose-Capillary: 112 mg/dL — ABNORMAL HIGH (ref 70–99)
Glucose-Capillary: 140 mg/dL — ABNORMAL HIGH (ref 70–99)
Glucose-Capillary: 149 mg/dL — ABNORMAL HIGH (ref 70–99)
Glucose-Capillary: 132 mg/dL — ABNORMAL HIGH (ref 70–99)

## 2024-05-08 LAB — BRAIN NATRIURETIC PEPTIDE: B Natriuretic Peptide: 150.1 pg/mL — ABNORMAL HIGH (ref 0.0–100.0)

## 2024-05-08 LAB — SEDIMENTATION RATE: Sed Rate: 72 mm/h — ABNORMAL HIGH (ref 0–16)

## 2024-05-08 LAB — PROCALCITONIN: Procalcitonin: 1.03 ng/mL

## 2024-05-08 LAB — HEPATIC FUNCTION PANEL
ALT: 61 U/L — ABNORMAL HIGH (ref 0–44)
AST: 73 U/L — ABNORMAL HIGH (ref 15–41)
Albumin: 1.9 g/dL — ABNORMAL LOW (ref 3.5–5.0)
Alkaline Phosphatase: 1246 U/L — ABNORMAL HIGH (ref 38–126)
Bilirubin, Direct: 0.7 mg/dL — ABNORMAL HIGH (ref 0.0–0.2)
Indirect Bilirubin: 0.9 mg/dL (ref 0.3–0.9)
Total Bilirubin: 1.6 mg/dL — ABNORMAL HIGH (ref 0.0–1.2)
Total Protein: 4.9 g/dL — ABNORMAL LOW (ref 6.5–8.1)

## 2024-05-08 NOTE — Progress Notes (Addendum)
 NAME:  Luis [name], MRN:  [id number], DOB:  02-Dec-1940, LOS: 31 ADMISSION DATE:  04/07/2024, CONSULTATION DATE:  10/30 REFERRING MD:  KC-TRH, CHIEF COMPLAINT:  acute resp failure   History of Present Illness:  Mr. Mcgurn is an 83 y/o gentleman with a history of pulmonary sarcoidosis on chronic steriods, COPD-asthma overlap on triple inhaled therapy & Dupixent , stage IV cecal adenocarcinoma being treated at [facility] since 2019 who presented to the hospital with progressive shortness of breath and dry cough over 4 days on 04/07/2024.  He had been seen in pulmonary clinic today following his symptoms starting on 7/23.  He had an exacerbation of his respiratory disease in March and September of this year.  Since his exacerbation in September he had gone back on prednisone , tapering from 30 mg to 20 mg to 10 mg daily.  When he got back down to 10 mg daily dose he felt his symptoms flared up again, similar to tapers done in the 1990s when he was on chronic steroids for sarcoidosis.  He has only been on intermittent steroids since 1990s; he has not been on steroid sparing DMARD therapy due to quiescent sarcoidosis over the years.  He has not had sick contacts recently.  No new symptoms other than some leg swelling consistent with hospital.  No fevers.  He has had green sputum production in the hospital, but did not have sputum at home.  He is not on home oxygen.  He last had chemotherapy 2 weeks ago-pembrolizumab and 5-fluorouracil .  He felt well after this for a few days before starting to feel run down.  His meds are not to take primary support.  He is on Eliquis  for DVT diagnosed in late July 2025 & Afib.  Patient hospital because required 4 to 6 L nasal cannula.  He has been maintained on azithromycin , ceftriaxone , prednisone  40mg  daily. Overall he felt a little better yesterday but is more dyspneic again today.   Pertinent  Medical History  Afib, recent cardioversion Metastatic adenocarcinoma of  cecum Pulmonary sarcoidosis diagnosed by open lung biopsy in 1990s COPD asthma overlap Coronary artery disease HTN HLD  Significant Hospital Events: Including procedures, antibiotic start and stop dates in addition to other pertinent events   10/26 admitted to the hospital, started on ceftriaxone  and azithromycin , prednisone  40 mg daily 10/29 completed azithromycin  10/30 stopped ceftriaxone  04/11/24 BRONCH BAL - culture negative 34% LYMPHS (in 2022 was pleural fluid had 93% lymphis) 11/5 desaturation at 0400 overnight, transition to 4 L/min.  Then up to 10 L/min when he was moved to chair.  Now weaned to 6 L/min 11/6 CT-PA chest >> no PE, calcified hilar and mediastinal nodes, new mediastinal air without any evidence for pneumothorax.  Right lung base infiltrate unchanged, some ground glass on the left unchanged 11/9 lasix  held due to borderline hypotension 11/10 extra lasix  11/13 - On 4L saturating 100%. . Patient is dyspneic.  On 4 L of oxygen.  He says he was not using any oxygen prior to coming to the hospital.  When he saw NP Hope mid-October his breathing was well and he was able to walk multiple blocks without any difficulty. 11/14: Hemodynamically stable, hemoglobin with some decreased 11.5, improving creatinine.  Becoming little more short of breath and hypoxic when stood for standing weight.  Giving 1 dose of IV Lasix  at 20 mg and restarting home Lasix . Pending formal cardiology consult regarding the use of Multaq . 11/15: Blood pressure started trending up so holding midodrine   and placed parameters.  Multaq  was discontinued by cardiology and if he goes back in a flutter, he will need DCCV. 11/17 patient very weak and deconditioned  11/18 continue to have dyspnea.  11/25 - pall care consult recommended DNR/DNI but remains full code.   Continues to require HHFNC with sats 89-95%.    SSEROIDS : Continue systemic steroid tx with plan for slow prolonged prednisone  taper as per  Pulmonary [Prednisone  30 mg daily for 7 days, Prednisone  20 mg daily for 7 days, Prednisone  15 mg daily for 7 days, Prednisone  10 mg daily for 7 days, Prednisone  5 mg daily for 7 days, then stop] - as noted above progress is marked by staggered moments of acute worsening   Interim History / Subjective:    11/27 - recalled by triad MD for consult. Per [facility] MD: on 11/13, he was on 6 L - on 11/19 he was increased to [facility]. Currentl on HHFNC 65% fipo2 and 40L flow rate.  AFebrile. Is on prednisone  taper. Is on eliquis  even prior to admit  Objective    Blood pressure 120/78, pulse 90, temperature 97.6 F (36.4 C), temperature source Oral, resp. rate (!) 26, height 5' 11 (1.803 m), weight 87.7 kg, SpO2 94%.    FiO2 (%):  [55 %-75 %] 65 %   Intake/Output Summary (Last 24 hours) at 05/08/2024 1036 Last data filed at 05/08/2024 0846 Gross per 24 hour  Intake 10 ml  Output 1500 ml  Net -1490 ml   Filed Weights   05/03/24 0500 05/06/24 0607 05/08/24 0603  Weight: 86.7 kg 81.5 kg 87.7 kg    Examination: General Appearance:  Looks cushingoid. Sitting in bed. Class 3-4 dyspnea talking Head:  Normocephalic, without obvious abnormality, atraumatic Eyes:  PERRL - yes, conjunctiva/corneas - muddy     Ears:  Normal external ear canals, both ears Nose:  G tube - no but has HHFN Throat:  ETT TUBE - no , OG tube - no Neck:  Supple,  No enlargement/tenderness/nodules Lungs: Junkly mildly labored respiration Heart:  S1 and S2 normal, no murmur, CVP - no.  Pressors - no Abdomen:  Soft, no masses, no organomegaly Genitalia / Rectal:  Not done Extremities:  Extremities- intact Skin:  ntact in exposed areas . Sacral area - not exmained Neurologic:  Sedation - none -> RASS - +1 . Moves all 4s - yes. CAM-ICU - neg . Orientation - x3+      Resolved problem list   Assessment and Plan   Acute hypoxic respiratory failure: Ground glass opacities likely due to checkpoint inhibitor pneumonitis due to  chronicity versus pulmonary edema: History of asthma on Dupixent : Stable sarcoidosis: Hypoalbuminemia: Multiple differentials for acute hypoxic respiratory failure as above.  Currently not wheezing.  Was started on steroid taper for checkpoint inhibitor pneumonitis. Echo 03/2024: EF 70-75% with grade 1 diastolic dysfunction.  11/27: Appears hypxemia worse sine last PCCM visit 04/24/24. THis is despite oral prednisone . BAL showed > 30% lymphis but culture negaive. Doubt PE (alread on eliquis ) Wosening is worrisome for permanent lung injury  PLAN  - get HRCT  -get ABG  - check procal, bnp -  Pneumomediastinum, new - Improving pneumomediastinum.as off 04/24/24  Plan  - reassess on CT chest 05/08/2024    CCM wil rounds Got to ICU if worse All deterioration of respiratory status is negative prognpostic sign    D/w [facility] Patient/family updated on need for CT  SIGNATURE    Dr. Dorethia Cave, M.D., F.C.C.P,  Pulmonary and Critical Care Medicine Staff Physician, [facility] Center Director - Interstitial Lung Disease  Program  Pulmonary Fibrosis [facility] at [facility] [place], [name], [address]   Pager: [phone], If no answer  -> Check AMION or Try [phone] Telephone (clinical office): [phone] Telephone (research): [phone]  11:26 AM 05/08/2024

## 2024-05-08 NOTE — Progress Notes (Signed)
 This nurse in room to remove unaboots upon removal the patient sustained two skin tears to bilateral shins pressure applied and steri strips placed to close skin tera unaboot to be replaced per ortho tech.

## 2024-05-08 NOTE — Progress Notes (Signed)
 RUE venous duplex has been completed.    Results can be found under chart review under CV PROC. 05/08/2024 4:59 PM Annett Boxwell RVT, RDMS

## 2024-05-08 NOTE — Progress Notes (Signed)
 Orthopedic Tech Progress Note Patient Details:  Luis [name] Sep 08, 1940 [id number]  Ortho Devices Type of Ortho Device: Radio broadcast assistant Ortho Device/Splint Location: BLE Ortho Device/Splint Interventions: Ordered, Application   Post Interventions Patient Tolerated: Well Instructions Provided: Care of device  [name] [name] 05/08/2024, 5:19 PM

## 2024-05-08 NOTE — Progress Notes (Addendum)
 PROGRESS NOTE    Luis [name]  FMW:[id number] DOB: 07-09-1940 DOA: 04/07/2024 PCP: [name], MD  Chief Complaint  Patient presents with   Respiratory Distress    Brief Narrative:   83 year old with Luis [name] history of metastatic colon cancer to the lungs liver and lymph nodes followed at [facility] since 2019, pulmonary sarcoidosis on chronic steroids, COPD, CAD, chronic diastolic CHF, HTN, HLD, and hypothyroidism who was admitted to the hospital 10/27 after presenting with progressive onset of worsening shortness of breath x 1 week.   Significant Events: 10/31 S/P bronchoscopy BAL culture no growth to date AFB fungus PJP DFA pending. Cytology atypical cells. Continue with prednisone  taper. 11/13: Mildly elevated blood pressure, still significant transaminitis-message sent to Dr. Clancy cardiologist regarding Multaq . Had 1 episode of bleeding while having bowel movement-history of hemorrhoid. 11/14: Hemodynamically stable, hemoglobin with some decreased 11.5, improving creatinine.  Becoming little more short of breath and hypoxic when stood for standing weight.  Giving 1 dose of IV Lasix  at 20 mg and restarting home Lasix . Pending formal cardiology consult regarding the use of Multaq . 11/15: Blood pressure started trending up so holding midodrine  and placed parameters.  Multaq  was discontinued by cardiology and if he goes back in [name] flutter, he will need DCCV. 11/17 patient very weak and deconditioned  11/18 continue to have dyspnea.  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 Community Acquired Pneumonia  Currently on HHFNC at 45 L (at time of pulm sign off, was on 2 L it looks like on 11/13 - since then, around 11/19-11/20, developed need for [facility]) Appreciate pulmonary reevaluation  CXR with findings concerning for multifocal pna CT high res pending Steroid taper per pulmonary  Scheduled and prn nebs/inhalers Completed course of abx for pneumonia   Pneumomediastinum Spontaneous and minimal without need for acute intervention   Acute on chronic diastolic CHF exacerbation TTE noted EF 70-74% with grade 1 DD  - Cardiology following - diuresis per cardiology - RUE US  for RUE swelling - net negative ~15.7L since admit   CAD Asymptomatic   Chronic atrial flutter Continue anticoagulation - Multaq  discontinued by Cardiology - if RVR recurs will need DCCV - heart rate reasonably controlled at present  Thrombocytopenia Noted, will continue to trend - fluctuating   Elevated LFT's Trend   HTN Has required midodrine  for blood pressure support during this admission - blood pressure presently stable with midodrine  support   Hypokalemia Follow, replace as needed   Hypomagnesemia Follow, replace as needed   Chronic anemia Hemoglobin stable at present   DM2 Reasonably well-controlled at present   Hyperlipidemia Continue usual medical therapy   Hypothyroidism Continue usual levothyroxine  dose   Colon cancer Appears stable at present - followed by [facility]    DVT prophylaxis: eliquis  Code Status: full Family Communication: none Disposition:   Status is: Inpatient Remains inpatient appropriate because: need for continued inpatient care, continued respiratory failure   Consultants:  Palliative care Cardiology Pulmonary   Procedures:  Echo IMPRESSIONS  1. Left ventricular ejection fraction, by estimation, is 70 to 75%. The   left ventricle has hyperdynamic function. The left ventricle has no  regional wall motion abnormalities. Left ventricular diastolic parameters  are consistent with Grade I diastolic  dysfunction (impaired relaxation).   2. Right ventricular systolic function is normal. The right ventricular  size is normal.   3. The mitral valve is degenerative. No evidence of mitral valve  regurgitation.   4. Difficult assessment for aortic stenosis, poor LVOT Doppler  evaluation. Consider future dedicated aortic stenosis study as outpatient.  The aortic valve was not well visualized. Aortic valve regurgitation is  not visualized. Aortic valve sclerosis is  present, with no evidence of aortic valve stenosis.   5. The inferior vena cava is normal in size with greater than 50%  respiratory variability, suggesting right atrial pressure of 3 mmHg.   Comparison(s): Prior images reviewed side by side. Function is more  dynamic.   Antimicrobials:  Anti-infectives (From admission, onward)    Start     Dose/Rate Route Frequency Ordered Stop   04/30/24 0900  sulfamethoxazole -trimethoprim  (BACTRIM ) 400-80 MG per tablet 1 tablet        1 tablet Oral Once per day on Monday Wednesday Friday 04/29/24 1649     04/21/24 1000  minocycline  (MINOCIN ) capsule 100 mg        100 mg Oral Daily 04/15/24 1245     04/13/24 2200  doxycycline  (VIBRA -TABS) tablet 100 mg        100 mg Oral Every 12 hours 04/13/24 1241 04/20/24 2227   04/12/24 1800  cefTRIAXone  (ROCEPHIN ) 2 g in sodium chloride  0.9 % 100 mL IVPB        2 g 200 mL/hr over 30 Minutes Intravenous Every 24 hours 04/12/24 1655 04/20/24 1739   04/12/24 1800  doxycycline  (VIBRAMYCIN ) 100 mg in sodium chloride  0.9 % 250 mL IVPB  Status:  Discontinued        100 mg 125 mL/hr over 120 Minutes Intravenous Every 12 hours 04/12/24 1655 04/13/24 1240   04/08/24 1000  azithromycin  (ZITHROMAX ) tablet 500 mg        500 mg Oral Daily 04/07/24 0238 04/09/24 0852   04/08/24  0000  cefTRIAXone  (ROCEPHIN ) 1 g in sodium chloride  0.9 % 100 mL IVPB        1 g 200 mL/hr over 30 Minutes Intravenous Every 24 hours 04/07/24 0238 04/11/24 0815   04/07/24 1000  minocycline  (MINOCIN ) capsule 100 mg  Status:  Discontinued        100 mg Oral Daily 04/07/24 0241 04/15/24 1204   04/07/24 0130  cefTRIAXone  (ROCEPHIN ) 2 g in sodium chloride  0.9 % 100 mL IVPB        2 g 200 mL/hr over 30 Minutes Intravenous  Once 04/07/24 0124 04/07/24 0255   04/07/24 0130  azithromycin  (ZITHROMAX ) 500 mg in sodium chloride  0.9 % 250 mL IVPB        500 mg 250 mL/hr over 60 Minutes Intravenous  Once 04/07/24 0124 04/07/24 0337       Subjective: Wife at bedside No new complaints other than RUE swelling  Objective: Vitals:   05/08/24 1146 05/08/24 1150 05/08/24 1159 05/08/24 1450  BP: 116/65 116/65    Pulse: (!) 110 (!) 108 (!) 106 94  Resp: (!) 26 (!) 40 (!) 34 (!) 30  Temp: 98.8 F (37.1 C)     TempSrc: Oral     SpO2: 90% 90% (!) 86% 94%  Weight:      Height:        Intake/Output Summary (Last 24 hours) at 05/08/2024 1539 Last data filed at 05/08/2024 1342 Gross per 24 hour  Intake 10 ml  Output 1450 ml  Net -1440 ml   Filed Weights   05/03/24 0500 05/06/24 0607 05/08/24 0603  Weight: 86.7 kg 81.5 kg 87.7 kg    Examination:  General: appears uncomfortable with respiratory effort Cardiovascular: RRR Lungs: diffuse crackles, increased WOB, tachypnea Neurological: Alert and oriented 3. Moves all extremities 4 with equal strength. Cranial nerves II through XII grossly intact. Extremities: bilateral LE unna boots - RUE swelling   Data Reviewed: I have personally reviewed following labs and imaging studies  CBC: Recent Labs  Lab 05/02/24 0440 05/05/24 0500 05/06/24 1105 05/07/24 0530 05/08/24 0410  WBC 10.5 10.0 15.6* 14.1* 13.0*  HGB 10.9* 9.7* 11.1* 11.1* 9.9*  HCT 33.7* 31.3* 35.6* 36.4* 32.2*  MCV 93.1 95.1 96.0 95.3 95.0  PLT 87* 94* 123* 134* 106*     Basic Metabolic Panel: Recent Labs  Lab 05/02/24 0440 05/03/24 0530 05/04/24 0325 05/05/24 0500 05/06/24 1105 05/07/24 0530 05/08/24 0410  NA 131* 135 135 136 131* 133* 133*  K 3.1* 3.6 3.6 3.8 5.0 5.0 4.4  CL 87* 87* 87* 86* 87* 87* 86*  CO2 33* 37* 42* 38* 35* 35* 36*  GLUCOSE 170* 111* 162* 151* 187* 206* 128*  BUN 22 21 19 19 22  28* 24*  CREATININE 0.78 1.02 0.78 0.73 0.97 0.96 0.81  CALCIUM  8.3* 8.3* 8.1* 8.1* 8.4* 8.9 8.6*  MG 1.5* 1.9  --  1.6* 1.7 1.8  --   PHOS 4.0  --   --  3.0 3.3 3.3 3.7    GFR: Estimated Creatinine Clearance: 73.6 mL/min (by C-G formula based on SCr of 0.81 mg/dL).  Liver Function Tests: Recent Labs  Lab 05/05/24 0500 05/06/24 1105 05/07/24 0530 05/08/24 0410  AST  --   --   --  73*  ALT  --   --   --  61*  ALKPHOS  --   --   --  1,246*  BILITOT  --   --   --  1.6*  PROT  --   --   --  4.9*  ALBUMIN  1.8* 2.0* 2.1* 1.9*  1.9*    CBG: Recent Labs  Lab 05/07/24 1639 05/07/24 2105 05/08/24 0602 05/08/24 0848 05/08/24 1212  GLUCAP 195* 182* 124* 112* 140*     No results found for this or any previous visit (from the past 240 hours).       Radiology Studies: DG CHEST PORT 1 VIEW Result Date: 05/08/2024 CLINICAL DATA:  Hypoxia. Acute on chronic respiratory failure. Follow-up exam. EXAM: PORTABLE CHEST 1 VIEW COMPARISON:  05/01/2024 and older studies.  CT, 04/20/2024. FINDINGS: Airspace lung opacities have increased throughout the left lung and at the right lung base compared to the most recent prior exam. There are underlying interstitial lung opacities at are without significant change. Cardiac silhouette is partly obscured by contiguous left lung opacity, but grossly unchanged. Right anterior chest wall internal jugular Port-Elio Haden-Cath is stable. Stable bilateral hilar and mediastinal calcifications and calcified lung granulomata. Probable small effusions. IMPRESSION: 1. Interval worsening of lung aeration since the most recent  prior study. Findings consistent with multifocal pneumonia in the proper clinical setting. Electronically Signed   By: Alm Parkins M.D.   On: 05/08/2024 11:42        Scheduled Meds:  apixaban   5 mg Oral BID   benzonatate   100 mg Oral TID   budesonide -glycopyrrolate -formoterol   2 puff Inhalation BID   Chlorhexidine  Gluconate Cloth  6 each Topical Daily   dextromethorphan   30 mg Oral BID   docusate sodium   100 mg Oral BID   furosemide   60 mg Intravenous Daily   gabapentin   200 mg Oral QHS   guaiFENesin   600 mg Oral BID   insulin  aspart  0-15 Units Subcutaneous TID WC   insulin  aspart  2 Units Subcutaneous TID WC   levothyroxine   50 mcg Oral Q0600   midodrine   10 mg Oral TID WC   minocycline   100 mg Oral Daily   montelukast   10 mg Oral QHS   pantoprazole   40 mg Oral BID   predniSONE   30 mg Oral Q breakfast   Followed by   NOREEN ON 05/10/2024] predniSONE   20 mg Oral Q breakfast   Followed by   NOREEN ON 05/17/2024] predniSONE   15 mg Oral Q breakfast   Followed by   NOREEN ON 05/24/2024] predniSONE   10 mg Oral Q breakfast   Followed by   NOREEN ON 05/31/2024] predniSONE   5 mg Oral Q breakfast   sodium chloride   2 spray Each Nare TID   sodium chloride  flush  10-40 mL Intracatheter Q12H   sucralfate   1 g Oral BID   sulfamethoxazole -trimethoprim   1 tablet Oral Once per day on Monday Wednesday Friday   Continuous Infusions:   LOS: 31 days    Time spent: over 30 min  35 min critical care time with acute hypoxic resp failure requiring heated high flow  Meliton Monte, MD [facility]   To contact the attending provider between 7A-7P or the covering provider during after hours 7P-7A, please log into the web site [email or web address] and access using universal [place] password for that web site. If you do not have the password, please call the hospital operator.  05/08/2024, 3:39 PM

## 2024-05-08 NOTE — Progress Notes (Signed)
 Progress Note  Patient Name: Luis [name] Date of Encounter: 05/08/2024 Primary Cardiologist: [name], MD   Subjective   Overnight O2 requirement has decreased. Patient notes profound weakness.  Can stand with one nurse.  Had symptomatic OH. No CP and no palpitations.  SOB and cough persists  Vital Signs    Vitals:   05/07/24 2304 05/08/24 0155 05/08/24 0343 05/08/24 0603  BP: 131/78     Pulse: 88 94  98  Resp: 20  20 (!) 25  Temp: 97.6 F (36.4 C)     TempSrc: Oral     SpO2: 96%   93%  Weight:    87.7 kg  Height:        Intake/Output Summary (Last 24 hours) at 05/08/2024 0707 Last data filed at 05/07/2024 2316 Gross per 24 hour  Intake 10 ml  Output 750 ml  Net -740 ml   Filed Weights   05/03/24 0500 05/06/24 0607 05/08/24 0603  Weight: 86.7 kg 81.5 kg 87.7 kg    Physical Exam   GEN: No acute distress.  Elderly male Cardiac: RRR, no murmurs, rubs, or gallops.  Respiratory: Bilateral wheezes bilaterally. GI: Soft, nontender, non-distended  MS: legs are well wrapped   Labs   Telemetry: SR with rare blocked PACs   Chemistry Recent Labs  Lab 05/06/24 1105 05/07/24 0530 05/08/24 0410  NA 131* 133* 133*  K 5.0 5.0 4.4  CL 87* 87* 86*  CO2 35* 35* 36*  GLUCOSE 187* 206* 128*  BUN 22 28* 24*  CREATININE 0.97 0.96 0.81  CALCIUM  8.4* 8.9 8.6*  PROT  --   --  4.9*  ALBUMIN  2.0* 2.1* 1.9*  1.9*  AST  --   --  73*  ALT  --   --  61*  ALKPHOS  --   --  1,246*  BILITOT  --   --  1.6*  GFRNONAA >60 >60 >60  ANIONGAP 9 11 11      Hematology Recent Labs  Lab 05/06/24 1105 05/07/24 0530 05/08/24 0410  WBC 15.6* 14.1* 13.0*  RBC 3.71* 3.82* 3.39*  HGB 11.1* 11.1* 9.9*  HCT 35.6* 36.4* 32.2*  MCV 96.0 95.3 95.0  MCH 29.9 29.1 29.2  MCHC 31.2 30.5 30.7  RDW 18.6* 18.6* 18.8*  PLT 123* 134* 106*    Cardiac Studies   Cardiac Studies & Procedures    ______________________________________________________________________________________________ CARDIAC CATHETERIZATION  CARDIAC CATHETERIZATION 04/25/2022  Conclusion   Patent stents in the LAD and diagonal.  Prior diagonal angioplasty site also widely patent.   Previously placed Ramus stent of unknown type is  widely patent.   The left ventricular systolic function is normal.   LV end diastolic pressure is normal.   The left ventricular ejection fraction is 55-65% by visual estimate.   There is no aortic valve stenosis.   Apical LAD with 70% lesion.  This is to distal and too small for PCI.  Not much myocardium supplied from this vessel.   Aortic saturation 94%, PA saturation 74%, PA pressure 40/14, mean PA pressure 23 mmHg, mean pulmonary capillary wedge pressure 15 mmHg, cardiac output 8.4 L/min, cardiac index 3.73.  Widely patent stents.  No targets for PCI.  Continue aggressive secondary prevention. Normal right heart pressures.  Consider noncardiac sources of shortness of breath.  Findings Coronary Findings Diagnostic  Dominance: Right  Left Anterior Descending Prox LAD lesion is 75% stenosed. Prox LAD lesion is 75% stenosed. Dist LAD lesion is 70% stenosed.  First  Diagonal Branch 1st Diag-1 lesion is 75% stenosed. Previously placed 1st Diag-2 stent (unknown type) is widely patent. 1st Diag-3 lesion is 50% stenosed.  Second Diagonal Branch Non-stenotic 2nd Diag lesion was previously treated.  Ramus Intermedius Previously placed Ramus drug eluting stent is widely patent. The lesion is type C, located at the major branch and chronically occluded with right-to-left and left-to-left collateral flow. The lesion is calcified. Previously placed Ramus drug eluting stent is widely patent. The lesion is type C, located at the major branch and chronically occluded with right-to-left and left-to-left collateral flow. The lesion is calcified. Previously placed Ramus stent of unknown  type is  widely patent.  Left Circumflex Vessel is small.  Right Coronary Artery The vessel exhibits minimal luminal irregularities.  Intervention  No interventions have been documented.   CARDIAC CATHETERIZATION  CARDIAC CATHETERIZATION 01/02/2019  Conclusion  Patent diagonal stent.  Previously placed Ramus drug eluting stent is widely patent.  Prox LAD lesion is 75% stenosed.  A drug-eluting stent was successfully placed using a STENT SYNERGY DES 3X24.  Post intervention, there is a 0% residual stenosis.  1st Diag-1 lesion is 75% stenosed. Balloon angioplasty was performed using a BALLOON SAPPHIRE 2.0X12.SABRA  Post intervention, there is a 40% residual stenosis.  The left ventricular systolic function is normal.  LV end diastolic pressure is mildly elevated. LVEDP 15 mm Hg.  The left ventricular ejection fraction is 55-65% by visual estimate.  There is no aortic valve stenosis.  Successful PCI of the LAD diagonal bifurcation.  Continue medical therapy as well.  Possible same day PCI candidate.  Findings Coronary Findings Diagnostic  Dominance: Right  Left Anterior Descending Prox LAD lesion is 75% stenosed.  First Diagonal Branch 1st Diag-1 lesion is 75% stenosed. Previously placed 1st Diag-2 stent (unknown type) is widely patent. 1st Diag-3 lesion is 50% stenosed.  Second Diagonal Branch Non-stenotic 2nd Diag lesion was previously treated.  Ramus Intermedius Previously placed Ramus drug eluting stent is widely patent. The lesion is type C, located at the major branch and chronically occluded with right-to-left and left-to-left collateral flow. The lesion is calcified.  Left Circumflex Vessel is small.  Right Coronary Artery The vessel exhibits minimal luminal irregularities.  Intervention  Prox LAD lesion Stent CATHETER LAUNCHER 6FREBU 3.5 guide catheter was inserted. Lesion crossed with guidewire using a WIRE ASAHI PROWATER 180CM. Pre-stent  angioplasty was performed using a BALLOON SAPPHIRE 2.5X15. A drug-eluting stent was successfully placed using a STENT SYNERGY DES 3X24. Stent strut is well apposed. Post-stent angioplasty was performed using a BALLOON SAPPHIRE [place] 3.5X15. Post-Intervention Lesion Assessment The intervention was successful. Pre-interventional TIMI flow is 3. Post-intervention TIMI flow is 3. No complications occurred at this lesion. There is a 0% residual stenosis post intervention.  1st Diag-1 lesion Angioplasty CATHETER LAUNCHER 6FREBU 3.5 guide catheter was inserted. WIRE HI TORQ BMW 190CM guidewire used to cross lesion. Balloon angioplasty was performed using a BALLOON SAPPHIRE 2.0X12. Final kissing balloon inflation was done with the 3.5 [place] in the LAD and the 2.0 in the diagonal vessel. Post-Intervention Lesion Assessment The intervention was successful. Pre-interventional TIMI flow is 3. Post-intervention TIMI flow is 3. No complications occurred at this lesion. There is a 40% residual stenosis post intervention.   STRESS TESTS  MYOCARDIAL PERFUSION IMAGING 02/10/2022  Interpretation Summary   The study is normal. The study is low risk.   No ST deviation was noted.   LV perfusion is normal. There is no evidence of ischemia. There is  no evidence of infarction.   Left ventricular function is normal. Nuclear stress EF: 63 %. The left ventricular ejection fraction is normal (55-65%). End diastolic cavity size is normal. End systolic cavity size is normal.   Prior study available for comparison from 01/02/2018.  Normal resting and stress perfusion. No ischemia or infarction EF 63%   ECHOCARDIOGRAM  ECHOCARDIOGRAM COMPLETE 04/08/2024  Narrative ECHOCARDIOGRAM REPORT    Patient Name:   JASANI DOLNEY Date of Exam: 04/08/2024 Medical Rec #:  [id number]       Height:       71.0 in Accession #:    [id number]      Weight:       219.6 lb Date of Birth:  1941-03-07      BSA:          2.194 m Patient Age:     83 years        BP:           161/74 mmHg Patient Gender: M               HR:           77 bpm. Exam Location:  Inpatient  Procedure: 2D Echo (Both Spectral and Color Flow Doppler were utilized during procedure).  Indications:    CHF  History:        Patient has prior history of Echocardiogram examinations. CHF.  Sonographer:    Norleen Amour Referring Phys: RAMESH KC  IMPRESSIONS   1. Left ventricular ejection fraction, by estimation, is 70 to 75%. The left ventricle has hyperdynamic function. The left ventricle has no regional wall motion abnormalities. Left ventricular diastolic parameters are consistent with Grade I diastolic dysfunction (impaired relaxation). 2. Right ventricular systolic function is normal. The right ventricular size is normal. 3. The mitral valve is degenerative. No evidence of mitral valve regurgitation. 4. Difficult assessment for aortic stenosis, poor LVOT Doppler evaluation. Consider future dedicated aortic stenosis study as outpatient. The aortic valve was not well visualized. Aortic valve regurgitation is not visualized. Aortic valve sclerosis is present, with no evidence of aortic valve stenosis. 5. The inferior vena cava is normal in size with greater than 50% respiratory variability, suggesting right atrial pressure of 3 mmHg.  Comparison(s): Prior images reviewed side by side. Function is more dynamic.  FINDINGS Left Ventricle: Left ventricular ejection fraction, by estimation, is 70 to 75%. The left ventricle has hyperdynamic function. The left ventricle has no regional wall motion abnormalities. The left ventricular internal cavity size was normal in size. There is no left ventricular hypertrophy. Left ventricular diastolic parameters are consistent with Grade I diastolic dysfunction (impaired relaxation).  Right Ventricle: The right ventricular size is normal. No increase in right ventricular wall thickness. Right ventricular systolic function is  normal.  Left Atrium: Left atrial size was normal in size.  Right Atrium: Right atrial size was normal in size.  Pericardium: There is no evidence of pericardial effusion.  Mitral Valve: The mitral valve is degenerative in appearance. No evidence of mitral valve regurgitation.  Tricuspid Valve: The tricuspid valve is normal in structure. Tricuspid valve regurgitation is not demonstrated. No evidence of tricuspid stenosis.  Aortic Valve: Difficult assessment for aortic stenosis, poor LVOT Doppler evaluation. Consider future dedicated aortic stenosis study as outpatient. The aortic valve was not well visualized. Aortic valve regurgitation is not visualized. Aortic valve sclerosis is present, with no evidence of aortic valve stenosis. Aortic valve mean gradient measures 4.0 mmHg. Aortic valve  peak gradient measures 8.5 mmHg. Aortic valve area, by VTI measures 1.34 cm.  Pulmonic Valve: The pulmonic valve was not well visualized. Pulmonic valve regurgitation is not visualized. No evidence of pulmonic stenosis.  Aorta: The aortic root and ascending aorta are structurally normal, with no evidence of dilitation.  Venous: The inferior vena cava is normal in size with greater than 50% respiratory variability, suggesting right atrial pressure of 3 mmHg.  IAS/Shunts: The atrial septum is grossly normal.   LEFT VENTRICLE PLAX 2D LVIDd:         4.70 cm     Diastology LVIDs:         3.00 cm     LV e' medial:    5.44 cm/s LV PW:         1.00 cm     LV E/e' medial:  11.7 LV IVS:        0.90 cm     LV e' lateral:   5.66 cm/s LVOT diam:     1.60 cm     LV E/e' lateral: 11.3 LV SV:         43 LV SV Index:   20 LVOT Area:     2.01 cm  LV Volumes (MOD) LV vol d, MOD A2C: 75.8 ml LV vol d, MOD A4C: 86.3 ml LV vol s, MOD A2C: 19.0 ml LV vol s, MOD A4C: 26.3 ml LV SV MOD A2C:     56.8 ml LV SV MOD A4C:     86.3 ml LV SV MOD BP:      58.6 ml  RIGHT VENTRICLE             IVC RV Basal diam:  3.60  cm     IVC diam: 1.10 cm RV S prime:     13.30 cm/s TAPSE (M-mode): 2.5 cm      PULMONARY VEINS Diastolic Velocity: 36.75 cm/s S/D Velocity:       1.30 Systolic Velocity:  47.30 cm/s  LEFT ATRIUM             Index        RIGHT ATRIUM           Index LA diam:        2.50 cm 1.14 cm/m   RA Area:     21.80 cm LA Vol (A2C):   44.4 ml 20.24 ml/m  RA Volume:   61.40 ml  27.99 ml/m LA Vol (A4C):   50.6 ml 23.06 ml/m LA Biplane Vol: 50.9 ml 23.20 ml/m AORTIC VALVE                     PULMONIC VALVE AV Area (Vmax):    1.61 cm      PV Vmax:       1.23 m/s AV Area (Vmean):   1.53 cm      PV Peak grad:  6.1 mmHg AV Area (VTI):     1.34 cm AV Vmax:           146.00 cm/s AV Vmean:          101.000 cm/s AV VTI:            0.322 m AV Peak Grad:      8.5 mmHg AV Mean Grad:      4.0 mmHg LVOT Vmax:         117.00 cm/s LVOT Vmean:        76.700 cm/s LVOT VTI:  0.215 m LVOT/AV VTI ratio: 0.67  AORTA Ao Root diam: 1.80 cm Ao Asc diam:  3.70 cm  MITRAL VALVE MV Area (PHT): 2.58 cm     SHUNTS MV Decel Time: 294 msec     Systemic VTI:  0.22 m MV E velocity: 63.90 cm/s   Systemic Diam: 1.60 cm MV A velocity: 135.00 cm/s MV E/A ratio:  0.47  Stanly Leavens MD Electronically signed by Stanly Leavens MD Signature Date/Time: 04/08/2024/3:31:49 PM    Final    MONITORS  CARDIAC EVENT MONITOR 01/11/2024       ______________________________________________________________________________________________        Assessment & Plan   CAD - stable, asymptomatic   Persistent AFL - eliquis  5 mg PO BID; dronedarone  has been stopped  Multi-component hypoxic respiratory failure - Acute on chronic HFpEF: continue IV diuretics- he is tolerating this well but given that IV diuretic did not lead to the step up in O2 needs I do not suspect this is driven by  - Hx of malignancy and pulmonary sarcoidosis with CAP, with acute on chronic COPD - he thinks re-peat bronch  would help.  I will defer to PCCM for their eval  Transaminitis in the setting of metastatic disease - trending  HTN with hx of OH - continue current therapies, I have ordered an abdominal binder  Hypokalemia and Hypomagnesemia - K goal of 4 and Mg goal of 2   For questions or updates, please contact [facility] Please consult [email or web address] for contact info under Cardiology/STEMI.      Stanly Leavens, MD FASE [facility] Cardiologist [facility]  [address] [place], [address] [place], [name] [address] [phone]  7:08 AM

## 2024-05-09 ENCOUNTER — Inpatient Hospital Stay (HOSPITAL_COMMUNITY)

## 2024-05-09 DIAGNOSIS — I5033 Acute on chronic diastolic (congestive) heart failure: Secondary | ICD-10-CM | POA: Diagnosis not present

## 2024-05-09 DIAGNOSIS — J441 Chronic obstructive pulmonary disease with (acute) exacerbation: Secondary | ICD-10-CM | POA: Diagnosis not present

## 2024-05-09 DIAGNOSIS — J9601 Acute respiratory failure with hypoxia: Secondary | ICD-10-CM | POA: Diagnosis not present

## 2024-05-09 DIAGNOSIS — J939 Pneumothorax, unspecified: Secondary | ICD-10-CM

## 2024-05-09 LAB — RENAL FUNCTION PANEL
Albumin: 1.8 g/dL — ABNORMAL LOW (ref 3.5–5.0)
Anion gap: 9 (ref 5–15)
BUN: 17 mg/dL (ref 8–23)
CO2: 37 mmol/L — ABNORMAL HIGH (ref 22–32)
Calcium: 8.3 mg/dL — ABNORMAL LOW (ref 8.9–10.3)
Chloride: 87 mmol/L — ABNORMAL LOW (ref 98–111)
Creatinine, Ser: 0.78 mg/dL (ref 0.61–1.24)
GFR, Estimated: >60 mL/min (ref >60)
Glucose, Bld: 115 mg/dL — ABNORMAL HIGH (ref 70–99)
Phosphorus: 3.8 mg/dL (ref 2.5–4.6)
Potassium: 4 mmol/L (ref 3.5–5.1)
Sodium: 133 mmol/L — ABNORMAL LOW (ref 135–145)

## 2024-05-09 LAB — GLUCOSE, CAPILLARY
Glucose-Capillary: 112 mg/dL — ABNORMAL HIGH (ref 70–99)
Glucose-Capillary: 155 mg/dL — ABNORMAL HIGH (ref 70–99)
Glucose-Capillary: 151 mg/dL — ABNORMAL HIGH (ref 70–99)

## 2024-05-09 LAB — CBC
HCT: 30.2 % — ABNORMAL LOW (ref 39.0–52.0)
Hemoglobin: 9.5 g/dL — ABNORMAL LOW (ref 13.0–17.0)
MCH: 29.3 pg (ref 26.0–34.0)
MCHC: 31.5 g/dL (ref 30.0–36.0)
MCV: 93.2 fL (ref 80.0–100.0)
Platelets: 101 K/uL — ABNORMAL LOW (ref 150–400)
RBC: 3.24 MIL/uL — ABNORMAL LOW (ref 4.22–5.81)
RDW: 18.7 % — ABNORMAL HIGH (ref 11.5–15.5)
WBC: 11.5 K/uL — ABNORMAL HIGH (ref 4.0–10.5)
nRBC: 0 % (ref 0.0–0.2)

## 2024-05-09 LAB — SEDIMENTATION RATE: Sed Rate: 91 mm/h — ABNORMAL HIGH (ref 0–16)

## 2024-05-09 LAB — BRAIN NATRIURETIC PEPTIDE: B Natriuretic Peptide: 224.1 pg/mL — ABNORMAL HIGH (ref 0.0–100.0)

## 2024-05-09 LAB — PROCALCITONIN: Procalcitonin: 1.1 ng/mL

## 2024-05-09 MED ORDER — SODIUM CHLORIDE 0.9% FLUSH
10.0000 mL | Freq: Three times a day (TID) | INTRAVENOUS | Status: DC
  Administered 2024-05-09 – 2024-05-10 (×4): 10 mL via INTRAPLEURAL

## 2024-05-09 MED ORDER — PIPERACILLIN-TAZOBACTAM 3.375 G IVPB
3.3750 g | Freq: Three times a day (TID) | INTRAVENOUS | Status: DC
  Administered 2024-05-09 – 2024-05-10 (×3): 3.375 g via INTRAVENOUS
  Filled 2024-05-09 (×3): qty 50

## 2024-05-09 MED ORDER — MORPHINE SULFATE (CONCENTRATE) 10 MG /0.5 ML PO SOLN: 4.0000 mg | Freq: Once | ORAL | Status: DC

## 2024-05-09 MED ORDER — PREDNISONE 10 MG PO TABS
10.0000 mg | ORAL_TABLET | Freq: Every day | ORAL | Status: DC
Start: 2024-05-26 — End: 2024-05-09

## 2024-05-09 MED ORDER — MIDODRINE HCL 5 MG PO TABS: 5.0000 mg | ORAL_TABLET | Freq: Three times a day (TID) | ORAL | Status: DC

## 2024-05-09 MED ORDER — PREDNISONE 5 MG PO TABS
5.0000 mg | ORAL_TABLET | Freq: Every day | ORAL | Status: DC
Start: 2024-06-02 — End: 2024-05-09

## 2024-05-09 MED ORDER — MIDAZOLAM HCL 2 MG/2ML IJ SOLN
INTRAMUSCULAR | Status: AC
Start: 2024-05-09 — End: 2024-05-09
  Administered 2024-05-09: 1 mg via INTRAVENOUS
  Filled 2024-05-09: qty 2

## 2024-05-09 MED ORDER — FENTANYL CITRATE (PF) 50 MCG/ML IJ SOSY
PREFILLED_SYRINGE | INTRAMUSCULAR | Status: AC
  Administered 2024-05-09: 25 ug via INTRAVENOUS
  Filled 2024-05-09: qty 1

## 2024-05-09 MED ORDER — LIDOCAINE 5 % EX PTCH
1.0000 | MEDICATED_PATCH | CUTANEOUS | Status: DC
  Administered 2024-05-09: 1 via TRANSDERMAL
  Filled 2024-05-09: qty 1

## 2024-05-09 MED ORDER — SODIUM CHLORIDE 0.9 % IV SOLN
1000.0000 mg | INTRAVENOUS | Status: DC
  Administered 2024-05-09 – 2024-05-10 (×2): 1000 mg via INTRAVENOUS
  Filled 2024-05-09: qty 16
  Filled 2024-05-09: qty 8

## 2024-05-09 MED ORDER — FENTANYL CITRATE (PF) 50 MCG/ML IJ SOSY: 50.0000 ug | PREFILLED_SYRINGE | Freq: Once | INTRAMUSCULAR | Status: AC

## 2024-05-09 MED ORDER — PREDNISONE 20 MG PO TABS
20.0000 mg | ORAL_TABLET | Freq: Every day | ORAL | Status: DC
Start: 2024-05-12 — End: 2024-05-09

## 2024-05-09 MED ORDER — MIDAZOLAM HCL (PF) 2 MG/2ML IJ SOLN: 2.0000 mg | INTRAMUSCULAR | Status: AC

## 2024-05-09 MED ORDER — PREDNISONE 5 MG PO TABS
15.0000 mg | ORAL_TABLET | Freq: Every day | ORAL | Status: DC
Start: 2024-05-19 — End: 2024-05-09

## 2024-05-09 NOTE — Progress Notes (Addendum)
 RT at bedside for sched inhaler. Pt was had Rhonchi, weak cough, and increased WOB. RT changed suction tubing and Yankauer  due to suction tubing being clogged. RT NTS pt and got out copious amounts of tan secretions. Pt states  I feel so much better. Pt tolerated well, vitals stable through out, SpO2 stable throughout.     05/09/24 0755  Therapy Vitals  Pulse Rate 94  Resp 18  MEWS Score/Color  MEWS Score 0  MEWS Score Color Green  Respiratory Assessment  Assessment Type Assess only  Respiratory Pattern Regular;Unlabored  Chest Assessment Chest expansion symmetrical  Cough Weak;Productive  Sputum Amount (S)  Copious  Sputum Color Tan  Sputum Consistency Thick;Tenacious  Sputum Specimen Source (S)  Nasal tracheal  Bilateral Breath Sounds Rhonchi;Diminished  Oxygen Therapy/Pulse Ox  O2 Device HHFNC  SpO2 90 %  O2 Therapy Oxygen humidified  O2 Flow Rate (L/min) 40 L/min  FiO2 (%) 70 %

## 2024-05-09 NOTE — Progress Notes (Signed)
  Progress Note  Patient Name: Luis [name] Date of Encounter: 05/09/2024 [facility] Cardiologist: [name], MD   Interval Summary   Patient reports feeling better after undergoing suctioning of his secretions this morning Overall, not much change in respiratory status High res CT pending Will follow recommendations from PCCM  Vital Signs Vitals:   05/09/24 0155 05/09/24 0407 05/09/24 0755 05/09/24 0838  BP:  135/79  104/68  Pulse:  87 94 94  Resp:  20 18 19   Temp:  (!) 97.1 F (36.2 C)  97.7 F (36.5 C)  TempSrc:  Axillary  Oral  SpO2: 100% 99% 90% 95%  Weight:  86.6 kg    Height:        Intake/Output Summary (Last 24 hours) at 05/09/2024 1001 Last data filed at 05/09/2024 0844 Gross per 24 hour  Intake --  Output 1250 ml  Net -1250 ml      05/09/2024    4:07 AM 05/08/2024    6:03 AM 05/06/2024    6:07 AM  Last 3 Weights  Weight (lbs) 190 lb 14.7 oz 193 lb 5.5 oz 179 lb 11.2 oz  Weight (kg) 86.6 kg 87.7 kg 81.511 kg     Telemetry/ECG  Sinus rhythm, HR 100s - Personally Reviewed  Physical Exam  GEN: No acute distress, remains on HHFNC.   Cardiac: mildly tachycardic.  Respiratory: coarse breath sounds, wheezing, coughing frequently. GI: Soft, nontender, non-distended  MS: No significant edema, bilateral wrappings  Assessment & Plan   Acute on chronic HFpEF Orthostatic hypotension  Presented with worsening shortness of breath Orthostatic hypotension limits GDMT and aggressive diuresis  Incomplete I&O's this admission but out 1.8 L yesterday  Low albumin  on labs Weight 179 lb ? 193 lb overnight, likely inaccurate   Renal function stable Currently on IV Lasix  60 mg daily Currently requiring midodrine  5 mg TID  Continue with strict I&O's, daily weights, daily BMPs   Paroxysmal atrial fibrillation  Bifascicular block Underwent DCCV 01/2024 Rates maintaining 90-100s Currently on Eliquis  5 mg BID If he were to return to a persistent rapid  rhythm, he would likely require DCCV   CAD s/p PCI to ramus, LAD, diagonal  Hyperlipidemia  Denies any chest pain  Home statin and Zetia  held in setting of elevated LFTs   Acute hypoxic respiratory failure Acute bronchospastic COPD exacerbation Community acquired PNA Pulmonary sarcoidosis Pneumomediastinum Shortness of breath clearly multifactorial as above  Presently on 25 L via HHFNC PCCM re-consulted on patient 11/27 High res chest CT ordered, pending read Continue to follow PCCM recommendations  Metastatic colon cancer Noted to have metastatic cancer to liver, lungs S/p liver resection, chemotherapy  LFTs remain elevated Seen by palliative medicine this admission Remains full code Patient is hopeful to discharge to SNF and then return home Started on morphine  for air hunger   For questions or updates, please contact [facility] Please consult [email or web address] for contact info under   Signed, Waddell DELENA Donath, PA-C

## 2024-05-09 NOTE — Progress Notes (Addendum)
 PT Cancellation Note  Patient Details Name: CRISPIN VOGEL MRN: [id number] DOB: Jan 14, 1941   Cancelled Treatment:    Reason Eval/Treat Not Completed: Medical issues which prohibited therapy. RN notified SPTA that pt was desatting and respiratory had been called. Defer seeing pt until medically stable. Will continue with PT POC as appropriate to pt respiratory status.   Winni Ehrhard 05/09/2024, 12:11 PM

## 2024-05-09 NOTE — Progress Notes (Signed)
 Patient complains of shortness of breath with oxygen saturations in the low 80s. Patient has very weak congested cough. Breathing treatment given and respiratory has been called for nasotracheal suctioning. Attending made aware via secure chat.

## 2024-05-09 NOTE — Progress Notes (Addendum)
 NAME:  Luis [name], MRN:  [id number], DOB:  [birth year], LOS: 32 ADMISSION DATE:  04/07/2024, CONSULTATION DATE:  10/30 REFERRING MD:  KC-TR[facility], CHIEF COMPLAINT:  acute resp failure   History of Present Illness:  Mr. [name] is an 83 y/o gentleman with a history of pulmonary sarcoidosis on chronic steriods, COPD-asthma overlap on triple inhaled therapy & Dupixent , stage IV cecal adenocarcinoma being treated at [facility] since 2019 who presented to the hospital with progressive shortness of breath and dry cough over 4 days on 04/07/2024.  He had been seen in pulmonary clinic today following his symptoms starting on 7/23.  He had an exacerbation of his respiratory disease in March and September of this year.  Since his exacerbation in September he had gone back on prednisone , tapering from 30 mg to 20 mg to 10 mg daily.  When he got back down to 10 mg daily dose he felt his symptoms flared up again, similar to tapers done in the 1990s when he was on chronic steroids for sarcoidosis.  He has only been on intermittent steroids since 1990s; he has not been on steroid sparing DMARD therapy due to quiescent sarcoidosis over the years.  He has not had sick contacts recently.  No new symptoms other than some leg swelling consistent with hospital.  No fevers.  He has had green sputum production in the hospital, but did not have sputum at home.  He is not on home oxygen.  He last had chemotherapy 2 weeks ago-pembrolizumab and 5-fluorouracil .  He felt well after this for a few days before starting to feel run down.  His meds are not to take primary support.  He is on Eliquis  for DVT diagnosed in late July 2025 & Afib.  Patient hospital because required 4 to 6 L nasal cannula.  He has been maintained on azithromycin , ceftriaxone , prednisone  40mg  daily. Overall he felt a little better yesterday but is more dyspneic again today.   Pertinent  Medical History  Afib, recent cardioversion Metastatic adenocarcinoma of  cecum Pulmonary sarcoidosis diagnosed by open lung biopsy in 1990s COPD asthma overlap Coronary artery disease HTN HLD  cOLON CANCER HX As of Oct 2025 -from Oct 2025 [facility] notes   Luis [name] is an 83 y.o. male with metastatic adenocarcinoma of the cecum, RAS/BRAF WT, MSS.  ONCOLOGY HISTORY   06/20/17 - Colonoscopy - digital rectal exam. Malignant tumor in the cecum. Biopsied. Diverticulosis in the entire examined colon. 06/22/17 - CT CAP - Large masslike appearance in the cecum approximately 6.4 by 5.0 cm. Several small pericecal lymph nodes are present. No pathologic retroperitoneal adenopathy or hepatic metastatic lesions are identified. No other distant metastatic lesions are observed. Extensive findings of old granulomatous disease in the chest including prominent calcified mediastinal and hilar/perihilar lymph nodes as well as scattered pulmonary granulomas and some chronic scattered scarring.  07/26/2017 Right colectomy. Path: adenocarcinoma of the cecum, 0/16 nodes involved, no LVI or PNI. MSI stable, no loss of mismatch repair protein expression. stage IIA (T3N0),  07/26/2018 Colonoscopy - to the ileocolonic anastomosis, unremarkable 01/2019 - CEA peaked at 6.8 02/07/2019 CT: Development of bilateral hepatic metastasis. Segment 7/8 5.4 x 5.2 cm lesion. Segment 6 mass measures 5.8 x 5.6 cm. Subtle segment 2 lesion measures 3.8 x 3.4 cm on 50/2.  03/04/2019 Liver biopsy: metastatic adenocarcinoma 03/12/2019 - 07/30/2019 FOLFOX x 10 cycles 05/23/19 - CT AP - Interval decrease of multiple hepatic metastases. Otherwise stable with no new or progressive findings in  the abdomen/pelvis to suggest new metastatic involvement.  08/13/19 - CT AP - Multifocal hepatic metastases, stable versus mildly improved. Status post right hemicolectomy. Brachytherapy seeds in the prostate.  08/21/19 - MRI liver - A total of 3 suspicious liver metastases are identified involving segment 2, segment 7, and segment 6.  When compared with the exam from 08/13/2019 lesions within segment 7 and segment 6 appear increased in size. Status post cholecystectomy.  09/17/2019-11/19/2019 FOLFIRI/Avastin  x 5 cycles --> Stable disease 11/27/19 - CT AP - 3 previously described index lesions in the liver are essentially stable with no generalized trend towards improvement or progression. No new liver lesion on today's study.  12/03/2019-04/29/2020 FOLFIRI/Panitumumab  x 10 cycles  02/06/20 - CT AP - Interval decreased in size of index lesions within the right hepatic lobe. The subtle lesion within the lateral segment of left hepatic lobe is not confidently identified on today's study.  04/14/20 - CEA 2.1 05/19/2020 MRI liver: Lesion in the posterior RIGHT hepatic lobe, lateral RIGHT hepatic lobe on precontrast imaging measuring approximately 2.9 x 2.2 cm as compared to 4.0 x 3.3 cm in March of 2021. 2.3 x 1.8 cm on post-contrast images this is at the boundary of hepatic subsegment VII and VIII. No appreciable internal enhancement. Study limited by artifact in the anterior liver particularly on the LEFT with stable appearance of a small area the anterior LEFT hepatic lobe, hepatic subsegment II. Area in hepatic subsegment VI not well evaluated better seen on recent abdomen and pelvis CT, grossly stable compared to imaging from November. 05/26/20 - CTA chest - Postoperative change on the right. Consolidation in a portion of the right upper lobe with associated cicatrization is stable and may represent chronic obstructive disease from sarcoidosis.  06/30/2020 Initial CT for the planned image guided ablation of right-sided liver lesions demonstrates significant interval enlargement of the 2 right-sided liver lesion targets, now estimated maximum diameter 3.8 cm (segment 7), and 5.0 cm (segment 6), as well as interval recurrence of the left sided segment 2 lesion, estimated 4.5 cm. Ablation was deferred given the findings. 07/23/2020 Diagnostic  laparoscopy, robot-assisted insertion of hepatic artery infusion pump (Intera 3000, 30 mL reservoir, flow rate 1.3 ml/day), portal lymphadenectomy, core tumor biopsy 08/02/20-10/2020 08/02/2020 initiated c1 FUDR  @ 50%. CEA 4.2 08/16/2020, start FOLFIRI/PMAb. 08/30/2020 CEA 2. FUDR  100% #2  09/27/20, FUDR  100% #3  10/10/2020 Signatera negative  10/25/20, CT c/a/p- Significantly decreased size of hepatic metastases. No new metastatic disease.  01/06/2021 Diagnostic laparoscopy, robot-assisted partial hepatectomy (segment 2, segment 6), microwave ablation (segment 7), intraoperative ultrasound  02/2021 Signatera negative  03/28/2021 CT C/A/P notes stable imaging within the liver, slight increase in right pleural effusion. CEA 1.5 05/16/21, CT c/a/p- Increasing ill defined low attenuation about the post-treatment changes in segment 7 concerning for recurrent disease. MRI A/P- Posttreatment changes of the liver with increased heterogeneous hypoenhancement adjacent to the hepatic segment 7 ablation cavity suspicious for local recurrence. 08/08/21, Stable post treatment changes in liver. Segment 7 ablation cavity appears unchanged. No new metastatic disease. Increasing pleural effusion.  10/17/2021, CT c/a/p- Interval increase in left hepatic lobe segment 3 liver lesion measuring up to 2 cm concerning for worsening metastatic disease. Signatera Positive  11/17/2021 completed 60 Gy in 8 fractions to segment 3 liver lesion 01/02/2022 CT C/A/P stable. CEA 2.0. Signatera + 9.56 02/27/2022 Signatera + 3.47 03/17/22 Thoracentesis of pleural effusion 03/27/2022 CT C/A/P: Increase in size of the hypoattenuating lesion along the deep margin of the previously treated  lesion in the right hepatic lobe. CEA 3.6 04/04/22, MRI A/P- Increased size of a heterogeneous lesion along the deep margin of a previously treated lesion in the right hepatic lobe, with appearance compatible with recurrent metastatic disease. Posttreatment changes in the  left hepatic lobe with wedge-shaped area of heterogeneous enhancement, favoring post procedural changes. Small right pleural effusion without significant change from prior study.  04/17/22-08/20/22 5FU + PMAb+ FUDR  --> clearance of ctDNA 04/17/22, FUDR  #1 @ 100%= 252mg  + 5FU Signatera up 47 05/01/22, PMAb added to FUDR  and 5FU 05/15/2022 5-FU with FUDR  #2 100%=252 mg 06/14/2022 C3 FUDR  100%= 252 mg 06/27/2022 CT C/A/P: Less conspicuous appearing heterogeneous focus along the deep margin of the previously treated segment 7 lesion. New small low attenuating indeterminate cystic lesion in the left hepatic lobe. 06/27/2022 Pmab decreased to 3mg /kg. CtDNA negative. 07/10/2022 FUDR  #4 @100 %= 252 mg 07/24/22, Glycerin flush + 5FU + PmAb  08/21/22, CT c/a/p- Slightly decreased conspicuity of enhancing portion along the deep margin of the right hepatic lobe treatment cavity. New subcentimeter hypodensity in the medial left hepatic lobe. Previously described subcentimeter cystic lesion left hepatic lobe has decreased in size. Stable small to moderate right pleural effusion.  10/09/22, MRI Abd- Heterogeneity of the left hepatic lobe without definite MRI correlate for the previously seen lesion within the left hepatic lobe. Recommend attention on follow-up. Postoperative changes within segment 6 with stable size of an hypoenhancing area along the deep margin of the resection cavity 01/01/23 CT c/a/p- Increased size of hypoenhancing portion along the deep margin of the right hepatic lobe treatment cavity, now measuring up to 3.8 cm, concerning for recurrent disease.  8/19-8/23/24 liver SBRT 4500cGy 03/19/2023 CT C/A/P: slightly decreased size of right hepatic lobe lesion; increased RLL pulmonary nodule now measuring 1.3cm. CEA 3.5 04/12/23, PET-CT Intense hypermetabolic activity in the posterior right hepatic lobe, concerning for viable metastatic disease. A smaller focus of marked activity in the left hepatic lobe is also  concerning. Intense hypermetabolic activity associated with the enlarging right lower lobe pulmonary nodule, concerning for metastatic disease. Other scattered pulmonary opacities have less intense avidity and are stable over multiple prior exams, suspect related to patient's history of sarcoidosis. 05/07/2023-06/25/23 FOLFIRI, Pmab, FUDR   07/09/2023 CT C/A/P: hepatic lesions, decrease in RLL nodule, small left pleural effusion. CEA 2.7. Hold tx  07/2023 SBRT to lung metastasis. 09/06/23, CT c/a/p- Increase in size of hepatic lesions. Stable right upper lobe nodular opacity. Intralobular septal thickening raising the question of lymphangitic spread 09/24/2023-01/30/24 FOLFOX/Pmab 10/08/23 5FU alone due to rash, fatigue 10/22/23 Resume FOLFOX PMAB with dose reductions for tolerability. 12/06/23 CT C/A/P decrease in hepatic metastatic disease. Transition to 5FU + PMAB 01/31/24 CT C/A/P stable R pulm nodule and hepatic disease. Increased opacity in the RLL is non-specific 01/31/24-current 5FU/PMAb  The patient's disease status is up-to-date as described in the oncology history above.    Significant Hospital Events: Including procedures, antibiotic start and stop dates in addition to other pertinent events   10/26 admitted to the hospital, started on ceftriaxone  and azithromycin , prednisone  40 mg daily 10/29 completed azithromycin  10/30 stopped ceftriaxone  04/11/24 BRONCH BAL - culture negative 34% LYMPHS (in 2022 was pleural fluid had 93% lymphis) 11/5 desaturation at 0400 overnight, transition to 4 L/min.  Then up to 10 L/min when he was moved to chair.  Now weaned to 6 L/min 11/6 CT-PA chest >> no PE, calcified hilar and mediastinal nodes, new mediastinal air without any evidence for pneumothorax.  Right lung  base infiltrate unchanged, some ground glass on the left unchanged 11/9 lasix  held due to borderline hypotension 11/10 extra lasix  11/13 - On 4L saturating 100%. . Patient is dyspneic.  On 4 L of  oxygen.  He says he was not using any oxygen prior to coming to the hospital.  When he saw NP Hope mid-October his breathing was well and he was able to walk multiple blocks without any difficulty. 11/14: Hemodynamically stable, hemoglobin with some decreased 11.5, improving creatinine.  Becoming little more short of breath and hypoxic when stood for standing weight.  Giving 1 dose of IV Lasix  at 20 mg and restarting home Lasix . Pending formal cardiology consult regarding the use of Multaq . 11/15: Blood pressure started trending up so holding midodrine  and placed parameters.  Multaq  was discontinued by cardiology and if he goes back in a flutter, he will need DCCV. 11/17 patient very weak and deconditioned  11/18 continue to have dyspnea.  11/25 - pall care consult recommended DNR/DNI but remains full code.   Continues to require HHFNC with sats 89-95%.    SSEROIDS : Continue systemic steroid tx with plan for slow prolonged prednisone  taper as per Pulmonary [Prednisone  30 mg daily for 7 days, Prednisone  20 mg daily for 7 days, Prednisone  15 mg daily for 7 days, Prednisone  10 mg daily for 7 days, Prednisone  5 mg daily for 7 days, then stop] - as noted above progress is marked by staggered moments of acute worsening   11/27 - recalled by [facility] MD for consult. Per [facility] MD: on 11/13, he was on 6 L - on 11/19 he was increased to [facility]. Currentl on HHFNC 65% fipo2 and 40L flow rate.  AFebrile. Is on prednisone  taper. Is on eliquis  even prior to admit  Interim History / Subjective:    11/28 - significant worseing of o2 needs. 100% fio2, 60LHHFNC + face mask. Reqwuiring frequent suctioniung. CT chest with worsening pneumonitis but no pneumomediastium. Procal is low at 1 but ESR is > 90. HE was on pred taper.  Chart review shows On Keytruda x 2 years   ADDENDUM 1:24 PM - HAS NEW LEFT PTX since  yesterday    Allergies  Allergen Reactions   Losartan  Potassium Other (See Comments)    Hyperkalemia      Objective    Blood pressure (!) 149/78, pulse (!) 110, temperature 97.6 F (36.4 C), temperature source Axillary, resp. rate 19, height 5' 11 (1.803 m), weight 86.6 kg, SpO2 94%.    FiO2 (%):  [70 %] 70 %   Intake/Output Summary (Last 24 hours) at 05/09/2024 1244 Last data filed at 05/09/2024 0844 Gross per 24 hour  Intake --  Output 1250 ml  Net -1250 ml   Filed Weights   05/06/24 9392 05/08/24 0603 05/09/24 0407  Weight: 81.5 kg 87.7 kg 86.6 kg    General Appearance:  Looks criticall ill Head:  Normocephalic, without obvious abnormality, atraumatic Eyes:  PERRL - yes, conjunctiva/corneas - muddy     Ears:  Normal external ear canals, both ears Nose:  G tube - no Throat:  ETT TUBE - no , OG tube - no. HHFNC and face mask ok. Pulse ox95% and class 4 dyspnea Neck:  Supple,  No enlargement/tenderness/nodules Lungs: Class 4 dyspnea. Face mask and HHFNC. [place] Heart:  S1 and S2 normal, no murmur, CVP - no.  Pressors - no Abdomen:  Soft, no masses, no organomegaly Genitalia / Rectal:  Not done Extremities:  Extremities- intact Skin:  ntact in exposed areas . Sacral area - x Neurologic:  Sedation - none -> RASS - +1 . Moves all 4s - yes. CAM-ICU - neg . Orientation - x3+    Anti-infectives (From admission, onward)    Start     Dose/Rate Route Frequency Ordered Stop   04/30/24 0900  sulfamethoxazole -trimethoprim  (BACTRIM ) 400-80 MG per tablet 1 tablet        1 tablet Oral Once per day on Monday Wednesday Friday 04/29/24 1649     04/21/24 1000  minocycline  (MINOCIN ) capsule 100 mg        100 mg Oral Daily 04/15/24 1245     04/13/24 2200  doxycycline  (VIBRA -TABS) tablet 100 mg        100 mg Oral Every 12 hours 04/13/24 1241 04/20/24 2227   04/12/24 1800  cefTRIAXone  (ROCEPHIN ) 2 g in sodium chloride  0.9 % 100 mL IVPB        2 g 200 mL/hr over 30 Minutes Intravenous Every 24 hours 04/12/24 1655 04/20/24 1739   04/12/24 1800  doxycycline  (VIBRAMYCIN ) 100 mg in sodium  chloride 0.9 % 250 mL IVPB  Status:  Discontinued        100 mg 125 mL/hr over 120 Minutes Intravenous Every 12 hours 04/12/24 1655 04/13/24 1240   04/08/24 1000  azithromycin  (ZITHROMAX ) tablet 500 mg        500 mg Oral Daily 04/07/24 0238 04/09/24 0852   04/08/24 0000  cefTRIAXone  (ROCEPHIN ) 1 g in sodium chloride  0.9 % 100 mL IVPB        1 g 200 mL/hr over 30 Minutes Intravenous Every 24 hours 04/07/24 0238 04/11/24 0815   04/07/24 1000  minocycline  (MINOCIN ) capsule 100 mg  Status:  Discontinued        100 mg Oral Daily 04/07/24 0241 04/15/24 1204   04/07/24 0130  cefTRIAXone  (ROCEPHIN ) 2 g in sodium chloride  0.9 % 100 mL IVPB        2 g 200 mL/hr over 30 Minutes Intravenous  Once 04/07/24 0124 04/07/24 0255   04/07/24 0130  azithromycin  (ZITHROMAX ) 500 mg in sodium chloride  0.9 % 250 mL IVPB        500 mg 250 mL/hr over 60 Minutes Intravenous  Once 04/07/24 0124 04/07/24 9662      Allergies  Allergen Reactions   Losartan  Potassium Other (See Comments)    Hyperkalemia       Resolved problem list  Pneumomediastinum, new - - Improving pneumomediastinum.as off 04/24/24. REslved on C 05/08/24 Assessment and Plan   Acute hypoxic respiratory failure: Ground glass opacities likely due to checkpoint inhibitor pneumonitis due to chronicity versus pulmonary edema: History of asthma on Dupixent : Stable sarcoidosis: Hypoalbuminemia:  11/27: Appears hypxemia worse sine last PCCM visit 04/24/24. THis is despite oral prednisone . BAL showed > 30% lymphis but culture negaive. Doubt PE (alread on eliquis ) Wosening is worrisome for permanent lung injury  11/28 -CT worse, ESR HIGH. 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 ? Worse with steroid taper versus . steroid resistant. Only feature is delayed onset . Now critically ill and severe resp failure and distress. DDx HCAP . At high risk for death x 24h  PLAN  - START STEROIDS pulse dose 1gm x 3 days  - abx as above ++ but  start empiric zosyn - Frequent suctioning -= Moprhine prn for dyspnea (give one dose now) - BiPAP PRN (provided no contraindication, current having secretions and that is a problem)  - O2 for pulse ox >  88% - DNR - no CPR, No intubation (See iPAL) but goal is to improve and get him home  - Concurrent palliation (d/w Ronal Plants and Dr Perri Raddle of [facility])  - Full comfort if worsens (See iPAL)   LEFT PTX - new  Plan  - chest tube NOW  - does not change other plan  Interdisciplinary Goals of Care Family Meeting   Date carried out:: 05/09/2024  Location of the meeting: Bedside  Member's involved: Physician, Bedside Registered Nurse, Family Member or next of kin, and Other: initially with patient with RN and rapid response RN Leonor repsent and later MD with wife and then back with patient with RN Leonor Rower Power of Attorney or acting medical decision maker: WIFE (patien has biological daughter who  is step daughter to wife)    Discussion: We discussed goals of care for [employer] .  Current situation and  underlying cancer. They recently signed a living will He said we are dying the day we are born. HE and she said that he is in a good place with the lord. She said he is suffering a lot. Explained the gravity of current illness and poor prognoso  Code status: No CPR. No intubation but full medical care including biPAP. Concurrnet palliation. If deteriorates, full palliation but goal is to see if steroids/abx can reverse situation and get him home  Disposition: Continue current acute care - no ned to move to ICU   Time spent for the meeting: 20          ATTESTATION & SIGNATURE   The patient EXZAVIER RUDERMAN is critically ill with multiple organ systems failure and requires high complexity decision making for assessment and support, frequent evaluation and titration of therapies, application of advanced monitoring technologies and extensive interpretation of multiple  databases and discussion with other appropriate health care personnel such as bedside nurses, social workers, case production designer, theatre/television/film, consultants, respiratory therapists, nutritionists, secretaries etc.,  Critical care time includes but is not restricted to just documentation time. Documentation can happen in parallel or sequential to care time depending on case mix urgency and priorities for the shift. So, overall critical Care Time devoted to patient care services described in this note is  40  Minutes.   This time reflects time of care of this signee Dr Dorethia Cave which includ does not reflect procedure time, or teaching time or supervisory time of PA/NP/Med student/Med Resident etc but could involve care discussion time     Dr. Dorethia Cave, M.D., [facility].C.P Pulmonary and Critical Care Medicine Staff Physician, [facility] [place] [facility] Pager: [phone], If no answer or between  15:00h - 7:00h: call [phone]  05/09/2024 1:14 PM     LABS    PULMONARY Recent Labs  Lab 05/08/24 1159  PHART 7.51*  PCO2ART 48  PO2ART 54*  HCO3 38.3*  O2SAT 88.8    CBC Recent Labs  Lab 05/07/24 0530 05/08/24 0410 05/09/24 0501  HGB 11.1* 9.9* 9.5*  HCT 36.4* 32.2* 30.2*  WBC 14.1* 13.0* 11.5*  PLT 134* 106* 101*    COAGULATION No results for input(s): INR in the last 168 hours.  CARDIAC  No results for input(s): TROPONINI in the last 168 hours. No results for input(s): PROBNP in the last 168 hours.   CHEMISTRY Recent Labs  Lab 05/03/24 0530 05/04/24 0325 05/05/24 0500 05/06/24 1105 05/07/24 0530 05/08/24 0410 05/09/24 0501  NA 135   < >  136 131* 133* 133* 133*  K 3.6   < > 3.8 5.0 5.0 4.4 4.0  CL 87*   < > 86* 87* 87* 86* 87*  CO2 37*   < > 38* 35* 35* 36* 37*  GLUCOSE 111*   < > 151* 187* 206* 128* 115*  BUN 21   < > 19 22 28* 24* 17  CREATININE 1.02   < > 0.73 0.97 0.96 0.81 0.78  CALCIUM  8.3*   < > 8.1* 8.4* 8.9 8.6* 8.3*   MG 1.9  --  1.6* 1.7 1.8  --   --   PHOS  --   --  3.0 3.3 3.3 3.7 3.8   < > = values in this interval not displayed.   Estimated Creatinine Clearance: 74.5 mL/min (by C-G formula based on SCr of 0.78 mg/dL).   LIVER Recent Labs  Lab 05/05/24 0500 05/06/24 1105 05/07/24 0530 05/08/24 0410 05/09/24 0501  AST  --   --   --  73*  --   ALT  --   --   --  61*  --   ALKPHOS  --   --   --  1,246*  --   BILITOT  --   --   --  1.6*  --   PROT  --   --   --  4.9*  --   ALBUMIN  1.8* 2.0* 2.1* 1.9*  1.9* 1.8*     INFECTIOUS Recent Labs  Lab 05/08/24 1533 05/09/24 0501  PROCALCITON 1.03 1.10     ENDOCRINE CBG (last 3)  Recent Labs    05/08/24 1656 05/08/24 2106 05/09/24 0617  GLUCAP 149* 132* 112*         IMAGING x48h  - image(s) personally visualized  -   highlighted in bold CT Chest High Resolution Result Date: 05/09/2024 CLINICAL DATA:  Interstitial lung disease. EXAM: CT CHEST WITHOUT CONTRAST TECHNIQUE: Multidetector CT imaging of the chest was performed following the standard protocol without intravenous contrast. High resolution imaging of the lungs, as well as inspiratory and expiratory imaging, was performed. RADIATION DOSE REDUCTION: This exam was performed according to the departmental dose-optimization program which includes automated exposure control, adjustment of the mA and/or kV according to patient size and/or use of iterative reconstruction technique. COMPARISON:  04/17/2024, 11/21/2023. FINDINGS: Cardiovascular: Right IJ Port-A-Cath terminates in the low SVC. Atherosclerotic calcification of the aorta, aortic valve and coronary arteries. Enlarged pulmonic trunk and heart. No pericardial effusion. Mediastinum/Nodes: Calcified mediastinal and hilar lymph nodes. No pathologically enlarged mediastinal or axillary lymph nodes. Hilar regions are difficult to definitively evaluate without IV contrast. Esophagus is grossly unremarkable. Lungs/Pleura: Image quality  is degraded by expiratory phase imaging and respiratory motion. Worsening airspace consolidation, peribronchovascular thickening and nodularity as well as ground-glass throughout the left lung. Similarly, worsening consolidation in the posterior segment right upper lobe and right lower lobe. Assessment for underlying interstitial lung disease is prohibitive in this setting. Small loculated right pleural effusion. Trace left pleural fluid. Debris in the airway. Upper Abdomen: Liver margin is irregular. Cholecystectomy. Visualized portions of the liver, gallbladder, adrenal glands, kidneys, spleen, pancreas, stomach and bowel are otherwise grossly unremarkable. No upper abdominal adenopathy. Musculoskeletal: Degenerative changes in the spine. IMPRESSION: 1. Worsening multi lobar pneumonia. 2. Assessment of underlying interstitial lung disease is challenging in this setting. 3. Small loculated right pleural effusion, stable. Trace left pleural fluid. 4. Liver appears cirrhotic. 5. Aortic atherosclerosis (ICD10-I70.0). Coronary artery calcification. 6. Enlarged pulmonic trunk, indicative  of pulmonary arterial hypertension. Electronically Signed   By: Newell Eke M.D.   On: 05/09/2024 10:08   VAS US  UPPER EXTREMITY VENOUS DUPLEX Result Date: 05/09/2024 UPPER VENOUS STUDY  Patient Name:  STEPHON WEATHERS  Date of Exam:   05/08/2024 Medical Rec #: [id number]        Accession #:    [id number] Date of Birth: [birth year]       Patient Gender: M Patient Age:   21 years Exam Location:  [facility] Procedure:      VAS US  UPPER EXTREMITY VENOUS DUPLEX Referring Phys: A POWELL JR --------------------------------------------------------------------------------  Indications: Edema Limitations: Subcutaneous edema, port placement and poor ultrasound/tissue interface. Comparison Study: No prior UEV on file Performing Technologist: Jody Hill RVT, RDMS  Examination Guidelines: A complete evaluation includes B-mode imaging,  spectral Doppler, color Doppler, and power Doppler as needed of all accessible portions of each vessel. Bilateral testing is considered an integral part of a complete examination. Limited examinations for reoccurring indications may be performed as noted.  Right Findings: +----------+------------+---------+-----------+----------+--------------------+ RIGHT     CompressiblePhasicitySpontaneousProperties      Summary        +----------+------------+---------+-----------+----------+--------------------+ IJV           Full       Yes       Yes                                   +----------+------------+---------+-----------+----------+--------------------+ Subclavian               Yes       Yes                   patent by                                                              color/doppler     +----------+------------+---------+-----------+----------+--------------------+ Axillary      Full       Yes       Yes                                   +----------+------------+---------+-----------+----------+--------------------+ Brachial      Full       Yes       Yes                                   +----------+------------+---------+-----------+----------+--------------------+ Radial        Full                                                       +----------+------------+---------+-----------+----------+--------------------+ Ulnar         Full                                  not well visualized  +----------+------------+---------+-----------+----------+--------------------+ Cephalic  Full                                                       +----------+------------+---------+-----------+----------+--------------------+ Basilic       Full       Yes       Yes                                   +----------+------------+---------+-----------+----------+--------------------+ Limited visualization of subclavian vein due to port placement. Limited  visualization of ulnar veins due to overlying subcutaneous edema.  Left Findings: +----------+------------+---------+-----------+----------+-------+ LEFT      CompressiblePhasicitySpontaneousPropertiesSummary +----------+------------+---------+-----------+----------+-------+ Subclavian    Full       Yes       Yes                      +----------+------------+---------+-----------+----------+-------+  Summary:  Right: No evidence of deep vein thrombosis in the upper extremity. No evidence of superficial vein thrombosis in the upper extremity.  Left: No evidence of thrombosis in the subclavian.  *See table(s) above for measurements and observations.  Diagnosing physician: Norman Serve Electronically signed by Norman Serve on 05/09/2024 at 7:14:14 AM.    Final    DG CHEST PORT 1 VIEW Result Date: 05/08/2024 CLINICAL DATA:  Hypoxia. Acute on chronic respiratory failure. Follow-up exam. EXAM: PORTABLE CHEST 1 VIEW COMPARISON:  05/01/2024 and older studies.  CT, 04/20/2024. FINDINGS: Airspace lung opacities have increased throughout the left lung and at the right lung base compared to the most recent prior exam. There are underlying interstitial lung opacities at are without significant change. Cardiac silhouette is partly obscured by contiguous left lung opacity, but grossly unchanged. Right anterior chest wall internal jugular Port-A-Cath is stable. Stable bilateral hilar and mediastinal calcifications and calcified lung granulomata. Probable small effusions. IMPRESSION: 1. Interval worsening of lung aeration since the most recent prior study. Findings consistent with multifocal pneumonia in the proper clinical setting. Electronically Signed   By: Alm Parkins M.D.   On: 05/08/2024 11:42

## 2024-05-09 NOTE — Progress Notes (Signed)
 Patient ID: Luis [name], male   DOB: 1941-06-08, 83 y.o.   MRN: [id number]    Progress Note from the Palliative Medicine Team at [facility]   Patient Name: Luis [name]        Date: 05/09/2024 DOB: [birth year]  Age: 83 y.o. MRN#: [id number] Attending Physician: Perri DELENA Meliton Mickey., * Primary Care Physician: [name], MD Admit Date: 04/07/2024   Reason for Consultation/Follow-up   Establishing Goals of Care   HPI/ Brief Hospital Review  83 y.o. male   admitted on 04/07/2024 PMH of metastatic colon cancer with metasteses to lungs/ liver, sarcoidosis, COPD, CAD s/p PCI, HFpEF, aflutter s/p cardioversion, hypertension, HLD, hypothyroidism that presented with progressive worsening of SOB for a week with chest congestion and blood tinged sputum production.    Admitted for treatment and stabilization.   Patient and family continue to work closely with TOC for next steps in transition of care, fortunately patient has not stabilized enough medically to realistically be able to tolerate SNF for rehab at this time   Patient and family face treatment option decisions, advanced directive decisions and anticipatory care needs.   Subjective  Extensive chart review has been completed prior to meeting with patient/family  including labs, vital signs, progress/consult notes, orders, medications and available advance directive documents.   This NP assessed patient at the bedside as a follow up for palliative medicine needs an emotional support, initially no family at bedside.    Patient appears weak, dyspneic at rest.  No family at bedside  Continued education with Mr. Upchurch regarding the seriousness of his current medical situation.      I again verbalized my concern for his long-term poor prognosis secondary to his multiple comorbidities and an unfortunate lack of progress within the context of a full medical support treatment plan.  Patient tells me   I need them to do a  bronchoscopy to get all this junk out    Education offered on the difference between a full medical support path attempting to prolong life and a palliative path focusing on comfort and dignity, allowing for natural death.  Patient continues to verbalize openness to all medical interventions to prolong life, he remains hopeful for improvement   Education offered today regarding  the importance of continued conversation with his wife and the  medical providers regarding overall plan of care and treatment options.        -  Raised awareness to CODE STATUS     -   Educated patient to consider DNR/DNI status understanding evidenced based poor outcomes in similar hospitalized patient, as the cause of arrest is likely associated with advanced chronic illness rather than an easily reversible acute cardio-pulmonary event.  Questions and concerns addressed   Discussed with primary team and nursing staff  PMT will continue to support holistically  Discussed with primary team and bedside RN   Time:  35 minutes  Detailed review of medical records ( labs, imaging, vital signs), medically appropriate exam ( MS, skin, cardiac,  resp)   discussed with treatment team, counseling and education to patient, family, staff, documenting clinical information, medication management, coordination of care    Ronal Plants NP  Palliative Medicine Team Team Phone # [phone] Pager [phone]

## 2024-05-09 NOTE — Plan of Care (Signed)
   Problem: Coping: Goal: Level of anxiety will decrease Outcome: Progressing

## 2024-05-09 NOTE — Plan of Care (Signed)

## 2024-05-09 NOTE — Progress Notes (Signed)
 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   Significant Events: 10/31 S/P bronchoscopy BAL culture no growth to date AFB fungus PJP DFA pending. Cytology atypical cells. Continue with prednisone  taper. 11/13: Mildly elevated blood pressure, still significant transaminitis-message sent to Dr. Clancy cardiologist regarding Multaq . Had 1 episode of bleeding while having bowel movement-history of hemorrhoid. 11/14: Hemodynamically stable, hemoglobin with some decreased 11.5, improving creatinine.  Becoming little more short of breath and hypoxic when stood for standing weight.  Giving 1 dose of IV Lasix  at 20 mg and restarting home Lasix . Pending formal cardiology consult regarding the use of Multaq . 11/15: Blood pressure started trending up so holding midodrine  and placed parameters.  Multaq  was discontinued by cardiology and if he goes back in [place] flutter, he will need DCCV. 11/17 patient very weak and deconditioned  11/18 continue to have dyspnea.  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 Community Acquired Pneumonia  Currently on HHFNC at 45 L (at time of pulm sign off, was on 2 L it looks like on 11/13 - since then, around 11/19-11/20, developed need for [facility]) Appreciate pulmonary reevaluation and additional recs, pending CXR with findings concerning for multifocal pna CT high res pending Steroid taper per pulmonary  Scheduled and prn nebs/inhalers Completed course of abx for pneumonia   Pneumomediastinum Spontaneous and minimal without need for acute intervention   Acute on chronic diastolic CHF exacerbation TTE noted EF 70-74% with grade 1 DD  - Cardiology following - diuresis per cardiology - RUE US  for RUE swelling - net negative ~14.8L since 04/25/24   CAD Asymptomatic   Chronic atrial flutter Continue anticoagulation - Multaq  discontinued by Cardiology - if RVR recurs will need DCCV - heart rate reasonably controlled at present  Thrombocytopenia Noted, will continue to trend - fluctuating   Elevated LFT's Trend   Hypotension Will try to wean midodrine  Lasix  for diuresis   Hypokalemia Follow, replace as needed   Hypomagnesemia Follow, replace as needed   Chronic anemia Fluctuating, trend   DM2 A1c 7.1 Reasonably well-controlled at present   Hyperlipidemia Continue usual medical therapy   Hypothyroidism Continue usual levothyroxine  dose   Colon cancer Appears stable at present - followed by [facility]    DVT prophylaxis: eliquis  Code Status: full Family Communication: none Disposition:   Status is: Inpatient Remains inpatient appropriate because: need for continued inpatient care, continued respiratory failure   Consultants:  Palliative care Cardiology Pulmonary   Procedures:  Echo IMPRESSIONS     1. Left ventricular  ejection fraction, by estimation, is 70 to 75%. The  left ventricle has hyperdynamic function. The  left ventricle has no  regional wall motion abnormalities. Left ventricular diastolic parameters  are consistent with Grade I diastolic  dysfunction (impaired relaxation).   2. Right ventricular systolic function is normal. The right ventricular  size is normal.   3. The mitral valve is degenerative. No evidence of mitral valve  regurgitation.   4. Difficult assessment for aortic stenosis, poor LVOT Doppler  evaluation. Consider future dedicated aortic stenosis study as outpatient.  The aortic valve was not well visualized. Aortic valve regurgitation is  not visualized. Aortic valve sclerosis is  present, with no evidence of aortic valve stenosis.   5. The inferior vena cava is normal in size with greater than 50%  respiratory variability, suggesting right atrial pressure of 3 mmHg.   Comparison(s): Prior images reviewed side by side. Function is more  dynamic.   Antimicrobials:  Anti-infectives (From admission, onward)    Start     Dose/Rate Route Frequency Ordered Stop   04/30/24 0900  sulfamethoxazole -trimethoprim  (BACTRIM ) 400-80 MG per tablet 1 tablet        1 tablet Oral Once per day on Monday Wednesday Friday 04/29/24 1649     04/21/24 1000  minocycline  (MINOCIN ) capsule 100 mg        100 mg Oral Daily 04/15/24 1245     04/13/24 2200  doxycycline  (VIBRA -TABS) tablet 100 mg        100 mg Oral Every 12 hours 04/13/24 1241 04/20/24 2227   04/12/24 1800  cefTRIAXone  (ROCEPHIN ) 2 g in sodium chloride  0.9 % 100 mL IVPB        2 g 200 mL/hr over 30 Minutes Intravenous Every 24 hours 04/12/24 1655 04/20/24 1739   04/12/24 1800  doxycycline  (VIBRAMYCIN ) 100 mg in sodium chloride  0.9 % 250 mL IVPB  Status:  Discontinued        100 mg 125 mL/hr over 120 Minutes Intravenous Every 12 hours 04/12/24 1655 04/13/24 1240   04/08/24 1000  azithromycin  (ZITHROMAX ) tablet 500 mg        500 mg Oral Daily 04/07/24 0238 04/09/24 0852   04/08/24 0000  cefTRIAXone  (ROCEPHIN ) 1 g in sodium chloride   0.9 % 100 mL IVPB        1 g 200 mL/hr over 30 Minutes Intravenous Every 24 hours 04/07/24 0238 04/11/24 0815   04/07/24 1000  minocycline  (MINOCIN ) capsule 100 mg  Status:  Discontinued        100 mg Oral Daily 04/07/24 0241 04/15/24 1204   04/07/24 0130  cefTRIAXone  (ROCEPHIN ) 2 g in sodium chloride  0.9 % 100 mL IVPB        2 g 200 mL/hr over 30 Minutes Intravenous  Once 04/07/24 0124 04/07/24 0255   04/07/24 0130  azithromycin  (ZITHROMAX ) 500 mg in sodium chloride  0.9 % 250 mL IVPB        500 mg 250 mL/hr over 60 Minutes Intravenous  Once 04/07/24 0124 04/07/24 0337       Subjective: No new complaints  Objective: Vitals:   05/09/24 0155 05/09/24 0407 05/09/24 0755 05/09/24 0838  BP:  135/79  104/68  Pulse:  87 94 94  Resp:  20 18 19   Temp:  (!) 97.1 F (36.2 C)  97.7 F (36.5 C)  TempSrc:  Axillary  Oral  SpO2: 100% 99% 90% 95%  Weight:  86.6 kg    Height:  Intake/Output Summary (Last 24 hours) at 05/09/2024 0910 Last data filed at 05/09/2024 0844 Gross per 24 hour  Intake --  Output 1250 ml  Net -1250 ml   Filed Weights   05/06/24 9392 05/08/24 0603 05/09/24 0407  Weight: 81.5 kg 87.7 kg 86.6 kg    Examination:  General: appears uncomfortable with respiratory effort Cardiovascular: RRR Lungs: increased wob - scattered rhonchi - he had lots of junky secretions he was trying to get up as I was auscultating Neurological: Alert and oriented 3. Moves all extremities 4 with equal strength. Cranial nerves II through XII grossly intact. Skin: Warm and dry. No rashes or lesions. Extremities: bilateral unna boots, mild RUE edema   Data Reviewed: I have personally reviewed following labs and imaging studies  CBC: Recent Labs  Lab 05/05/24 0500 05/06/24 1105 05/07/24 0530 05/08/24 0410 05/09/24 0501  WBC 10.0 15.6* 14.1* 13.0* 11.5*  HGB 9.7* 11.1* 11.1* 9.9* 9.5*  HCT 31.3* 35.6* 36.4* 32.2* 30.2*  MCV 95.1 96.0 95.3 95.0 93.2  PLT 94* 123* 134*  106* 101*    Basic Metabolic Panel: Recent Labs  Lab 05/03/24 0530 05/04/24 0325 05/05/24 0500 05/06/24 1105 05/07/24 0530 05/08/24 0410 05/09/24 0501  NA 135   < > 136 131* 133* 133* 133*  K 3.6   < > 3.8 5.0 5.0 4.4 4.0  CL 87*   < > 86* 87* 87* 86* 87*  CO2 37*   < > 38* 35* 35* 36* 37*  GLUCOSE 111*   < > 151* 187* 206* 128* 115*  BUN 21   < > 19 22 28* 24* 17  CREATININE 1.02   < > 0.73 0.97 0.96 0.81 0.78  CALCIUM  8.3*   < > 8.1* 8.4* 8.9 8.6* 8.3*  MG 1.9  --  1.6* 1.7 1.8  --   --   PHOS  --   --  3.0 3.3 3.3 3.7 3.8   < > = values in this interval not displayed.    GFR: Estimated Creatinine Clearance: 74.5 mL/min (by C-G formula based on SCr of 0.78 mg/dL).  Liver Function Tests: Recent Labs  Lab 05/05/24 0500 05/06/24 1105 05/07/24 0530 05/08/24 0410 05/09/24 0501  AST  --   --   --  73*  --   ALT  --   --   --  61*  --   ALKPHOS  --   --   --  1,246*  --   BILITOT  --   --   --  1.6*  --   PROT  --   --   --  4.9*  --   ALBUMIN  1.8* 2.0* 2.1* 1.9*  1.9* 1.8*    CBG: Recent Labs  Lab 05/08/24 0848 05/08/24 1212 05/08/24 1656 05/08/24 2106 05/09/24 0617  GLUCAP 112* 140* 149* 132* 112*     No results found for this or any previous visit (from the past 240 hours).       Radiology Studies: VAS US  UPPER EXTREMITY VENOUS DUPLEX Result Date: 05/09/2024 UPPER VENOUS STUDY  Patient Name:  RYLAN BERNARD  Date of Exam:   05/08/2024 Medical Rec #: [id number]        Accession #:    [id number] Date of Birth: [birth year]       Patient Gender: M Patient Age:   65 years Exam Location:  [facility] Procedure:      VAS US  UPPER EXTREMITY VENOUS DUPLEX Referring Phys: Cache Decoursey POWELL JR --------------------------------------------------------------------------------  Indications: Edema Limitations: Subcutaneous edema, port placement and poor ultrasound/tissue interface. Comparison Study: No prior UEV on file Performing Technologist: Jody Hill RVT, RDMS   Examination Guidelines: Jaryn Rosko complete evaluation includes B-mode imaging, spectral Doppler, color Doppler, and power Doppler as needed of all accessible portions of each vessel. Bilateral testing is considered an integral part of Zaia Carre complete examination. Limited examinations for reoccurring indications may be performed as noted.  Right Findings: +----------+------------+---------+-----------+----------+--------------------+ RIGHT     CompressiblePhasicitySpontaneousProperties      Summary        +----------+------------+---------+-----------+----------+--------------------+ IJV           Full       Yes       Yes                                   +----------+------------+---------+-----------+----------+--------------------+ Subclavian               Yes       Yes                   patent by                                                              color/doppler     +----------+------------+---------+-----------+----------+--------------------+ Axillary      Full       Yes       Yes                                   +----------+------------+---------+-----------+----------+--------------------+ Brachial      Full       Yes       Yes                                   +----------+------------+---------+-----------+----------+--------------------+ Radial        Full                                                       +----------+------------+---------+-----------+----------+--------------------+ Ulnar         Full                                  not well visualized  +----------+------------+---------+-----------+----------+--------------------+ Cephalic      Full                                                       +----------+------------+---------+-----------+----------+--------------------+ Basilic       Full       Yes       Yes                                   +----------+------------+---------+-----------+----------+--------------------+  Limited visualization of subclavian vein due to port placement. Limited visualization of ulnar veins due to overlying subcutaneous edema.  Left Findings: +----------+------------+---------+-----------+----------+-------+ LEFT      CompressiblePhasicitySpontaneousPropertiesSummary +----------+------------+---------+-----------+----------+-------+ Subclavian    Full       Yes       Yes                      +----------+------------+---------+-----------+----------+-------+  Summary:  Right: No evidence of deep vein thrombosis in the upper extremity. No evidence of superficial vein thrombosis in the upper extremity.  Left: No evidence of thrombosis in the subclavian.  *See table(s) above for measurements and observations.  Diagnosing physician: Norman Serve Electronically signed by Norman Serve on 05/09/2024 at 7:14:14 AM.    Final    DG CHEST PORT 1 VIEW Result Date: 05/08/2024 CLINICAL DATA:  Hypoxia. Acute on chronic respiratory failure. Follow-up exam. EXAM: PORTABLE CHEST 1 VIEW COMPARISON:  05/01/2024 and older studies.  CT, 04/20/2024. FINDINGS: Airspace lung opacities have increased throughout the left lung and at the right lung base compared to the most recent prior exam. There are underlying interstitial lung opacities at are without significant change. Cardiac silhouette is partly obscured by contiguous left lung opacity, but grossly unchanged. Right anterior chest wall internal jugular Port-Magaline Steinberg-Cath is stable. Stable bilateral hilar and mediastinal calcifications and calcified lung granulomata. Probable small effusions. IMPRESSION: 1. Interval worsening of lung aeration since the most recent prior study. Findings consistent with multifocal pneumonia in the proper clinical setting. Electronically Signed   By: Alm Parkins M.D.   On: 05/08/2024 11:42        Scheduled Meds:  apixaban   5 mg Oral BID   benzonatate   100 mg Oral TID   budesonide -glycopyrrolate -formoterol   2 puff  Inhalation BID   Chlorhexidine  Gluconate Cloth  6 each Topical Daily   dextromethorphan   30 mg Oral BID   docusate sodium   100 mg Oral BID   furosemide   60 mg Intravenous Daily   gabapentin   200 mg Oral QHS   guaiFENesin   600 mg Oral BID   insulin  aspart  0-15 Units Subcutaneous TID WC   insulin  aspart  2 Units Subcutaneous TID WC   levothyroxine   50 mcg Oral Q0600   lidocaine   1 patch Transdermal Q24H   midodrine   10 mg Oral TID WC   minocycline   100 mg Oral Daily   montelukast   10 mg Oral QHS   pantoprazole   40 mg Oral BID   predniSONE   30 mg Oral Q breakfast   Followed by   NOREEN ON 05/10/2024] predniSONE   20 mg Oral Q breakfast   Followed by   NOREEN ON 05/17/2024] predniSONE   15 mg Oral Q breakfast   Followed by   NOREEN ON 05/24/2024] predniSONE   10 mg Oral Q breakfast   Followed by   NOREEN ON 05/31/2024] predniSONE   5 mg Oral Q breakfast   sodium chloride   2 spray Each Nare TID   sodium chloride  flush  10-40 mL Intracatheter Q12H   sucralfate   1 g Oral BID   sulfamethoxazole -trimethoprim   1 tablet Oral Once per day on Monday Wednesday Friday   Continuous Infusions:   LOS: 32 days    Time spent: over 30 min   Meliton Monte, MD [facility]   To contact the attending provider between 7A-7P or the covering provider during after hours 7P-7A, please log into the web site [email or web address] and access using universal [place] password for that web  site. If you do not have the password, please call the hospital operator.  05/09/2024, 9:10 AM

## 2024-05-09 NOTE — Progress Notes (Addendum)
 OT Cancellation Note  Patient Details Name: Luis [name] MRN: [id number] DOB: September 25, 1940   Cancelled Treatment:    Reason Eval/Treat Not Completed: Medical issues which prohibited therapy. Per RN, pt desating, and respiratory has been called. Will hold therapy session, and follow up as able to.   Traeton Bordas C, OT  Acute Rehabilitation Services Office [phone] Secure chat preferred  Adrianne GORMAN Savers 05/09/2024, 12:06 PM

## 2024-05-09 NOTE — Procedures (Signed)
 Insertion of Chest Tube Procedure Note  Luis [name]  [id number]  Nov 26, 1940  Date:05/09/24  Time:2:13 PM    Provider Performing: Sammi Gore   Procedure: Chest Tube Insertion (32551)  Indication(s) Pneumothorax  Consent Risks of the procedure as well as the alternatives and risks of each were explained to the patient and/or caregiver.  Consent for the procedure was obtained and is signed in the bedside chart  Anesthesia Topical only with 1% lidocaine     Time Out Verified patient identification, verified procedure, site/side was marked, verified correct patient position, special equipment/implants available, medications/allergies/relevant history reviewed, required imaging and test results available.   Sterile Technique Maximal sterile technique including full sterile barrier drape, hand hygiene, sterile gown, sterile gloves, mask, hair covering, sterile ultrasound probe cover (if used).   Procedure Description Ultrasound not used to identify appropriate pleural anatomy for placement and overlying skin marked. Area of placement cleaned and draped in sterile fashion.  A 14 French pigtail pleural catheter was placed into the left pleural space using Seldinger technique. Appropriate return of air was obtained.  The tube was connected to atrium and placed on -20 cm H2O wall suction.   Complications/Tolerance None; patient tolerated the procedure well. Chest X-ray is ordered to verify placement.   EBL Minimal  Specimen(s) none   Sammi Gore, PA - [facility] For pager details, please see AMION or use Epic chat  After 1900, please call [facility] for cross coverage needs 05/09/2024, 2:14 PM

## 2024-05-10 ENCOUNTER — Inpatient Hospital Stay (HOSPITAL_COMMUNITY)

## 2024-05-10 DIAGNOSIS — I517 Cardiomegaly: Secondary | ICD-10-CM | POA: Diagnosis not present

## 2024-05-10 DIAGNOSIS — I2510 Atherosclerotic heart disease of native coronary artery without angina pectoris: Secondary | ICD-10-CM | POA: Diagnosis not present

## 2024-05-10 DIAGNOSIS — Z515 Encounter for palliative care: Secondary | ICD-10-CM | POA: Diagnosis not present

## 2024-05-10 DIAGNOSIS — J188 Other pneumonia, unspecified organism: Secondary | ICD-10-CM | POA: Diagnosis not present

## 2024-05-10 DIAGNOSIS — I5031 Acute diastolic (congestive) heart failure: Secondary | ICD-10-CM | POA: Diagnosis not present

## 2024-05-10 DIAGNOSIS — J90 Pleural effusion, not elsewhere classified: Secondary | ICD-10-CM | POA: Diagnosis not present

## 2024-05-10 DIAGNOSIS — J948 Other specified pleural conditions: Secondary | ICD-10-CM | POA: Diagnosis not present

## 2024-05-10 DIAGNOSIS — I2583 Coronary atherosclerosis due to lipid rich plaque: Secondary | ICD-10-CM

## 2024-05-10 DIAGNOSIS — E7800 Pure hypercholesterolemia, unspecified: Secondary | ICD-10-CM

## 2024-05-10 DIAGNOSIS — J939 Pneumothorax, unspecified: Secondary | ICD-10-CM | POA: Diagnosis not present

## 2024-05-10 DIAGNOSIS — R7401 Elevation of levels of liver transaminase levels: Secondary | ICD-10-CM

## 2024-05-10 DIAGNOSIS — I951 Orthostatic hypotension: Secondary | ICD-10-CM | POA: Diagnosis not present

## 2024-05-10 DIAGNOSIS — J9601 Acute respiratory failure with hypoxia: Secondary | ICD-10-CM | POA: Diagnosis not present

## 2024-05-10 DIAGNOSIS — I480 Paroxysmal atrial fibrillation: Secondary | ICD-10-CM | POA: Diagnosis not present

## 2024-05-10 DIAGNOSIS — J441 Chronic obstructive pulmonary disease with (acute) exacerbation: Secondary | ICD-10-CM | POA: Diagnosis not present

## 2024-05-10 LAB — RENAL FUNCTION PANEL
Albumin: 2 g/dL — ABNORMAL LOW (ref 3.5–5.0)
Anion gap: 11 (ref 5–15)
BUN: 25 mg/dL — ABNORMAL HIGH (ref 8–23)
CO2: 36 mmol/L — ABNORMAL HIGH (ref 22–32)
Calcium: 8.3 mg/dL — ABNORMAL LOW (ref 8.9–10.3)
Chloride: 87 mmol/L — ABNORMAL LOW (ref 98–111)
Creatinine, Ser: 1.08 mg/dL (ref 0.61–1.24)
GFR, Estimated: >60 mL/min (ref >60)
Glucose, Bld: 183 mg/dL — ABNORMAL HIGH (ref 70–99)
Phosphorus: 6.7 mg/dL — ABNORMAL HIGH (ref 2.5–4.6)
Potassium: 5.1 mmol/L (ref 3.5–5.1)
Sodium: 134 mmol/L — ABNORMAL LOW (ref 135–145)

## 2024-05-10 LAB — CBC
HCT: 34.8 % — ABNORMAL LOW (ref 39.0–52.0)
Hemoglobin: 10.3 g/dL — ABNORMAL LOW (ref 13.0–17.0)
MCH: 28.9 pg (ref 26.0–34.0)
MCHC: 29.6 g/dL — ABNORMAL LOW (ref 30.0–36.0)
MCV: 97.5 fL (ref 80.0–100.0)
Platelets: 114 K/uL — ABNORMAL LOW (ref 150–400)
RBC: 3.57 MIL/uL — ABNORMAL LOW (ref 4.22–5.81)
RDW: 18.3 % — ABNORMAL HIGH (ref 11.5–15.5)
WBC: 15 K/uL — ABNORMAL HIGH (ref 4.0–10.5)
nRBC: 0 % (ref 0.0–0.2)

## 2024-05-10 LAB — BLOOD GAS, VENOUS
Acid-Base Excess: 8.5 mmol/L — ABNORMAL HIGH (ref 0.0–2.0)
Bicarbonate: 37.5 mmol/L — ABNORMAL HIGH (ref 20.0–28.0)
O2 Saturation: 86 %
Patient temperature: 37
pCO2, Ven: 78 mmHg (ref 44–60)
pH, Ven: 7.29 (ref 7.25–7.43)
pO2, Ven: 55 mmHg — ABNORMAL HIGH (ref 32–45)

## 2024-05-10 LAB — GLUCOSE, CAPILLARY: Glucose-Capillary: 193 mg/dL — ABNORMAL HIGH (ref 70–99)

## 2024-05-10 LAB — MAGNESIUM: Magnesium: 1.8 mg/dL (ref 1.7–2.4)

## 2024-05-10 MED ORDER — HALOPERIDOL LACTATE 2 MG/ML PO CONC: 2.0000 mg | Freq: Four times a day (QID) | ORAL | Status: DC | PRN

## 2024-05-10 MED ORDER — ONDANSETRON 4 MG PO TBDP: 4.0000 mg | ORAL_TABLET | Freq: Four times a day (QID) | ORAL | Status: DC | PRN

## 2024-05-10 MED ORDER — ACETAMINOPHEN 325 MG PO TABS: 650.0000 mg | ORAL_TABLET | Freq: Four times a day (QID) | ORAL | Status: DC | PRN

## 2024-05-10 MED ORDER — LORAZEPAM 1 MG PO TABS: 1.0000 mg | ORAL_TABLET | ORAL | Status: DC | PRN

## 2024-05-10 MED ORDER — GLYCOPYRROLATE 0.2 MG/ML IJ SOLN: 0.2000 mg | INTRAMUSCULAR | Status: DC | PRN

## 2024-05-10 MED ORDER — ONDANSETRON HCL 4 MG/2ML IJ SOLN: 4.0000 mg | Freq: Four times a day (QID) | INTRAMUSCULAR | Status: DC | PRN

## 2024-05-10 MED ORDER — MORPHINE BOLUS VIA INFUSION
1.0000 mg | INTRAVENOUS | Status: DC | PRN
  Administered 2024-05-10: 3 mg via INTRAVENOUS
  Administered 2024-05-10: 2 mg via INTRAVENOUS

## 2024-05-10 MED ORDER — MORPHINE SULFATE (CONCENTRATE) 10 MG /0.5 ML PO SOLN: 5.0000 mg | ORAL | Status: DC | PRN

## 2024-05-10 MED ORDER — LORAZEPAM 2 MG/ML IJ SOLN: 1.0000 mg | INTRAMUSCULAR | Status: DC | PRN

## 2024-05-10 MED ORDER — ACETAMINOPHEN 650 MG RE SUPP: 650.0000 mg | Freq: Four times a day (QID) | RECTAL | Status: DC | PRN

## 2024-05-10 MED ORDER — HALOPERIDOL LACTATE 5 MG/ML IJ SOLN: 2.0000 mg | Freq: Four times a day (QID) | INTRAMUSCULAR | Status: DC | PRN

## 2024-05-10 MED ORDER — HALOPERIDOL 1 MG PO TABS: 2.0000 mg | ORAL_TABLET | Freq: Four times a day (QID) | ORAL | Status: DC | PRN

## 2024-05-10 MED ORDER — POLYVINYL ALCOHOL 1.4 % OP SOLN: 1.0000 [drp] | Freq: Four times a day (QID) | OPHTHALMIC | Status: DC | PRN

## 2024-05-10 MED ORDER — BIOTENE DRY MOUTH MT LIQD
15.0000 mL | Freq: Two times a day (BID) | OROMUCOSAL | Status: DC
  Administered 2024-05-10: 15 mL via TOPICAL

## 2024-05-10 MED ORDER — LORAZEPAM 2 MG/ML PO CONC: 1.0000 mg | ORAL | Status: DC | PRN

## 2024-05-10 MED ORDER — MORPHINE SULFATE (CONCENTRATE) 10 MG /0.5 ML PO SOLN
4.0000 mg | Freq: Once | ORAL | Status: AC
  Administered 2024-05-10: 4 mg via ORAL

## 2024-05-10 MED ORDER — DIPHENHYDRAMINE HCL 50 MG/ML IJ SOLN: 25.0000 mg | INTRAMUSCULAR | Status: DC | PRN

## 2024-05-10 MED ORDER — MORPHINE 100MG IN NS 100ML (1MG/ML) PREMIX INFUSION
1.0000 mg/h | INTRAVENOUS | Status: DC
  Administered 2024-05-10: 2 mg/h via INTRAVENOUS
  Filled 2024-05-10: qty 100

## 2024-05-10 MED ORDER — GLYCOPYRROLATE 1 MG PO TABS: 1.0000 mg | ORAL_TABLET | ORAL | Status: DC | PRN

## 2024-05-10 NOTE — Progress Notes (Signed)
 OT Cancellation Note and OT Sign Off  Patient Details Name: Luis [name] MRN: [id number] DOB: [birth year]   Cancelled Treatment:    Reason Eval/Treat Not Completed: Medical issues which prohibited therapy;Other (comment) (Per conversation with RN, pt has declined medically, is transitioning to comfort care, and with no further OT-related needs identified at this time. OT signing off per request of RN. Please reconsult if pt status or needs change. Thank you.)  Margarie Rockey HERO., OTR/L, MA Acute Rehab [phone]   Margarie FORBES Horns 05/10/2024, 1:12 PM

## 2024-05-10 NOTE — Progress Notes (Addendum)
 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   Significant Events: 10/31 S/P bronchoscopy BAL culture no growth to date AFB fungus PJP DFA pending. Cytology atypical cells. Continue with prednisone  taper. 11/13: Mildly elevated blood pressure, still significant transaminitis-message sent to Dr. Clancy cardiologist regarding Multaq . Had 1 episode of bleeding while having bowel movement-history of hemorrhoid. 11/14: Hemodynamically stable, hemoglobin with some decreased 11.5, improving creatinine.  Becoming little more short of breath and hypoxic when stood for standing weight.  Giving 1 dose of IV Lasix  at 20 mg and restarting home Lasix . Pending formal cardiology consult regarding the use of Multaq . 11/15: Blood pressure started trending up so holding midodrine  and placed parameters.  Multaq  was discontinued by cardiology and if he goes back in Luis [name] flutter, he will need DCCV. 11/17 patient very weak and deconditioned  11/18 continue to have dyspnea.  11/20 on [facility] 11/27 pulmonology reconsulted 11/28 high res CT with multifocal pna - abx restarted, high dose steroids per pulm.  Acute resp distress, repeat CXR showed L pneumothorax, s/p chest tube placement 11/29 AMS since chest tube placement, asymmetric pupils.  Head CT and neurology consult.    Assessment & Plan:   Principal Problem:   Acute exacerbation of COPD with asthma (HCC) Active Problems:   Acute on chronic diastolic heart failure (HCC)    CAD (coronary artery disease)   Atrial flutter (HCC)   Essential hypertension   Hypokalemia   Chronic anemia   Type 2 diabetes mellitus with hyperlipidemia (HCC)   Hypothyroidism   Colon cancer (HCC)   Pneumothorax  Goals of Care Declining from mental status standpoint today.  Discussed with his wife, she doesn't want extraordinary measures and wants him to be comfortable.  She's aware that he's declining and may not improve.  For now, will follow up his encephalopathy as planned below, but would be open to transitioning to Luis [name] focus on comfort if things were not improving/he continues to decline.   Addendum 11:10 AM his wife has decided to forego imaging and additional workup at this time.  She wants to focus on his comfort.  I think this is reasonable.  His family doctor is currently at the bedside and his son/daughter + his wife's daughter are on their way to see him (coming from [place] as [name]).  Will cancel the head CT order.  Per my discussion with his wife/RN, he's currently comfortable.  Discussed will d/c therapies not focused on comfort (steroids, abx, etc).  Will continue  HHFNC, chest tube.  After his family gets here, will probably end up needing to start Luis [name] morphine  gtt or similar given his significant O2 needs.  Palliative care, Luis [name], approached during our discussion and is planning for Luis [name] family meeting around 2 PM - will follow their final note/recs. Comfort measures ordered Abx, steroids d/c'd     Will hold on plan for morphine  gtt until family is here and after further discussion  Acute Metabolic Encephalopathy Focal Neurologic Deficit Since the procedure yesterday (thoracentesis),  he's been altered.  His wife notes it was around the time he got fentanyl /morphine  (which would've been around 2 PM).  Today, he's significantly altered compared to yesterday, also has anisocoria (L pupil > R).  Moving all extremities, but poor attention, difficult exam. 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 Community Acquired Pneumonia  Left Hydropneumothorax  Currently on HHFNC at 60 L, 90% FiO2 (at time of pulm sign off, was on 2 L it looks like on 11/13 - since then, around 11/19-11/20, developed need for [facility]) Appreciate pulmonary reevaluation and additional recs, pending CXR with findings concerning for multifocal pna CT high res with multilobar pneumonia, assessment of interstitial lung disease difficult in this setting, small loculated R effusion, trace L pleural fluid S/p chest tube 11/28 per pulmonary, management of chest tube per pulm  High dose steroids per pulm  Scheduled and prn nebs/inhalers Zosyn per pulmonary, appreciate recs   Pneumomediastinum Spontaneous and minimal without need for acute intervention   Acute on chronic diastolic CHF exacerbation TTE noted EF 70-74% with grade 1 DD  - Cardiology following - diuresis per cardiology - RUE US  for RUE swelling - negative for DVT - net negative ~14.3L since 04/25/24   CAD Asymptomatic   Chronic atrial flutter Holding eliquis  for now, awaiting head CT with AMS - Multaq  discontinued by Cardiology - if RVR recurs will need DCCV - heart rate reasonably controlled at present  Thrombocytopenia Noted, will continue to trend - fluctuating   Elevated LFT's Trend   Hypotension Will try to wean midodrine  Lasix  for diuresis   Hypokalemia Follow, replace as needed   Hypomagnesemia Follow, replace as needed   Chronic anemia Fluctuating, trend   DM2 A1c 7.1 Reasonably well-controlled at present   Hyperlipidemia Continue usual medical therapy   Hypothyroidism Continue usual levothyroxine  dose   Colon cancer Appears stable at present -  followed by [facility]    DVT prophylaxis: eliquis  Code Status: full Family Communication: none Disposition:   Status is: Inpatient Remains inpatient appropriate because: need for continued inpatient care, continued respiratory failure   Consultants:  Palliative care Cardiology Pulmonary   Procedures:  Echo IMPRESSIONS     1. Left ventricular ejection fraction, by estimation, is 70 to 75%. The  left ventricle has hyperdynamic function. The left ventricle has no  regional wall motion abnormalities. Left ventricular diastolic parameters  are consistent with Grade I diastolic  dysfunction (impaired relaxation).   2. Right ventricular systolic function is normal. The right ventricular  size is normal.   3. The mitral valve is degenerative. No evidence of mitral valve  regurgitation.   4. Difficult assessment for aortic stenosis, poor LVOT Doppler  evaluation. Consider future dedicated aortic stenosis study as outpatient.  The aortic valve was not well visualized. Aortic valve regurgitation is  not visualized. Aortic valve sclerosis is  present, with no evidence of aortic valve stenosis.   5. The inferior vena cava is normal in size with greater than 50%  respiratory variability, suggesting right atrial pressure of 3 mmHg.   Comparison(s): Prior images reviewed side by side. Function is more  dynamic.   Antimicrobials:  Anti-infectives (From admission, onward)    Start     Dose/Rate Route Frequency Ordered Stop   05/09/24 1415  piperacillin-tazobactam (ZOSYN)  IVPB 3.375 g        3.375 g 12.5 mL/hr over 240 Minutes Intravenous Every 8 hours 05/09/24 1317     04/30/24 0900  sulfamethoxazole -trimethoprim  (BACTRIM ) 400-80 MG per tablet 1 tablet        1 tablet Oral Once per day on Monday Wednesday Friday 04/29/24 1649     04/21/24 1000  minocycline  (MINOCIN ) capsule 100 mg        100 mg Oral Daily 04/15/24 1245     04/13/24 2200  doxycycline  (VIBRA -TABS) tablet 100 mg         100 mg Oral Every 12 hours 04/13/24 1241 04/20/24 2227   04/12/24 1800  cefTRIAXone  (ROCEPHIN ) 2 g in sodium chloride  0.9 % 100 mL IVPB        2 g 200 mL/hr over 30 Minutes Intravenous Every 24 hours 04/12/24 1655 04/20/24 1739   04/12/24 1800  doxycycline  (VIBRAMYCIN ) 100 mg in sodium chloride  0.9 % 250 mL IVPB  Status:  Discontinued        100 mg 125 mL/hr over 120 Minutes Intravenous Every 12 hours 04/12/24 1655 04/13/24 1240   04/08/24 1000  azithromycin  (ZITHROMAX ) tablet 500 mg        500 mg Oral Daily 04/07/24 0238 04/09/24 0852   04/08/24 0000  cefTRIAXone  (ROCEPHIN ) 1 g in sodium chloride  0.9 % 100 mL IVPB        1 g 200 mL/hr over 30 Minutes Intravenous Every 24 hours 04/07/24 0238 04/11/24 0815   04/07/24 1000  minocycline  (MINOCIN ) capsule 100 mg  Status:  Discontinued        100 mg Oral Daily 04/07/24 0241 04/15/24 1204   04/07/24 0130  cefTRIAXone  (ROCEPHIN ) 2 g in sodium chloride  0.9 % 100 mL IVPB        2 g 200 mL/hr over 30 Minutes Intravenous  Once 04/07/24 0124 04/07/24 0255   04/07/24 0130  azithromycin  (ZITHROMAX ) 500 mg in sodium chloride  0.9 % 250 mL IVPB        500 mg 250 mL/hr over 60 Minutes Intravenous  Once 04/07/24 0124 04/07/24 9662       Subjective: More confused today Wife is at bedside - notes confusion has been present since procedure done yesterday - doesn't want him to get fentanyl  or similar meds again.  He asks am I dying on 2 occasions, but not able to participate well in interview otherwise.    Objective: Vitals:   05/09/24 2355 05/10/24 0324 05/10/24 0749 05/10/24 0900  BP: 125/71 130/71  (!) 155/86  Pulse: 89 91  99  Resp: 14 13  14   Temp: 97.6 F (36.4 C) 98.1 F (36.7 C)  97.8 F (36.6 C)  TempSrc: Oral Oral  Oral  SpO2: 99% 99% 100% 99%  Weight:      Height:        Intake/Output Summary (Last 24 hours) at 05/10/2024 0926 Last data filed at 05/10/2024 9388 Gross per 24 hour  Intake 88.69 ml  Output 860 ml  Net -771.31 ml    Filed Weights   05/06/24 0607 05/08/24 0603 05/09/24 0407  Weight: 81.5 kg 87.7 kg 86.6 kg    Examination:  General: acutely ill appearing - staring to the right Cardiovascular: RRR Lungs: increased WOB on HHFNC Neurological: disoriented, poor attention - R gaze preference - R pupil 2 mm, L pupil 4 mm, minimally responsive.  Follows commands, but inconsistently, moving all extremities - difficult exam overall.   Skin: Warm and  dry. No rashes or lesions. Extremities: bilateral unna boots   Data Reviewed: I have personally reviewed following labs and imaging studies  CBC: Recent Labs  Lab 05/06/24 1105 05/07/24 0530 05/08/24 0410 05/09/24 0501 05/10/24 0515  WBC 15.6* 14.1* 13.0* 11.5* 15.0*  HGB 11.1* 11.1* 9.9* 9.5* 10.3*  HCT 35.6* 36.4* 32.2* 30.2* 34.8*  MCV 96.0 95.3 95.0 93.2 97.5  PLT 123* 134* 106* 101* 114*    Basic Metabolic Panel: Recent Labs  Lab 05/05/24 0500 05/06/24 1105 05/07/24 0530 05/08/24 0410 05/09/24 0501 05/10/24 0515  NA 136 131* 133* 133* 133* 134*  K 3.8 5.0 5.0 4.4 4.0 5.1  CL 86* 87* 87* 86* 87* 87*  CO2 38* 35* 35* 36* 37* 36*  GLUCOSE 151* 187* 206* 128* 115* 183*  BUN 19 22 28* 24* 17 25*  CREATININE 0.73 0.97 0.96 0.81 0.78 1.08  CALCIUM  8.1* 8.4* 8.9 8.6* 8.3* 8.3*  MG 1.6* 1.7 1.8  --   --  1.8  PHOS 3.0 3.3 3.3 3.7 3.8 6.7*    GFR: Estimated Creatinine Clearance: 55.2 mL/min (by C-G formula based on SCr of 1.08 mg/dL).  Liver Function Tests: Recent Labs  Lab 05/06/24 1105 05/07/24 0530 05/08/24 0410 05/09/24 0501 05/10/24 0515  AST  --   --  73*  --   --   ALT  --   --  61*  --   --   ALKPHOS  --   --  1,246*  --   --   BILITOT  --   --  1.6*  --   --   PROT  --   --  4.9*  --   --   ALBUMIN  2.0* 2.1* 1.9*  1.9* 1.8* 2.0*    CBG: Recent Labs  Lab 05/08/24 2106 05/09/24 0617 05/09/24 1501 05/09/24 1720 05/10/24 0654  GLUCAP 132* 112* 155* 151* 193*     No results found for this or any previous  visit (from the past 240 hours).       Radiology Studies: DG CHEST PORT 1 VIEW Result Date: 05/10/2024 EXAM: 1 VIEW(S) XRAY OF THE CHEST 05/10/2024 05:37:00 AM COMPARISON: Portable chest 05/09/2024 02:18 PM. CLINICAL HISTORY: Pneumothorax. FINDINGS: LINES, TUBES AND DEVICES: Right chest port catheter terminates in the distal SVC. Pigtail tube thoracostomy unchanged in positioning with the pigtail in the lateral upper left thorax. LUNGS AND PLEURA: Left hydropneumothorax with small hydro component, apical pneumo component estimated at 10% of the left chest volume, unchanged. Extensive airspace disease in the left lung. Patchy consolidation in the right base. Small right pleural effusion. The rest of the right lung is clear of infiltrate. Scattered calcified granulomas. Calcified hilar lymph nodes. Some underlying central edema may be present. HEART AND MEDIASTINUM: Cardiomegaly. Calcified mediastinal lymph nodes. Stable mediastinum. BONES AND SOFT TISSUES: No acute osseous abnormality. IMPRESSION: 1. Unchanged Left hydropneumothorax with small hydro component and apical pneumo component estimated at 10% of the left chest volume, unchanged. 2. Extensive airspace disease in the left lung and patchy consolidation in the right base. 3. Small right pleural effusion. 4. Cardiomegaly with possible underlying central edema. Electronically signed by: Francis Quam MD 05/10/2024 06:28 AM EST RP Workstation: [id number]   DG CHEST PORT 1 VIEW Result Date: 05/09/2024 CLINICAL DATA:  [id number] Encounter for chest tube placement [id number] EXAM: PORTABLE CHEST - 1 VIEW COMPARISON:  05/09/2024 12:42 FINDINGS: Similar interstitial opacities throughout the left lung with patchy opacities in both lung bases. Blunting of both costophrenic sulci is  also again unchanged. Interval placement of Pritika Alvarez percutaneous left pigtail thoracostomy tube with re-expansion of the lung and no significant pneumothorax visualized. Moderate cardiomegaly.  Right chest port terminating at the cavoatrial junction, unchanged. IMPRESSION: Interval placement of Dejanique Ruehl left-sided thoracostomy tube with re-expansion of the lung and no significant pneumothorax visualized. Otherwise, no significant interval change to the airspace disease and bilateral pleural effusions. Electronically Signed   By: Rogelia Myers M.D.   On: 05/09/2024 14:46   DG CHEST PORT 1 VIEW Result Date: 05/09/2024 CLINICAL DATA:  Shortness of breath EXAM: PORTABLE CHEST 1 VIEW COMPARISON:  Chest radiograph and CT chest dated 05/08/2024 FINDINGS: Lines/tubes: Right chest wall port tip projects over the SVC. Lungs: Interval improved left lung aeration. Decreased but persistent right lower and diffuse left lung opacities. Pleura: New small left pneumothorax. Similar small right pleural effusion. Decreased trace left pleural effusion. Heart/mediastinum: Similar enlarged cardiomediastinal silhouette. Bones: No acute osseous abnormality. IMPRESSION: 1. New small left pneumothorax. 2. Interval improved left lung aeration. Decreased but persistent right lower and diffuse left lung opacities. 3. Similar small right pleural effusion. Decreased trace left pleural effusion. Critical Value/emergent results were called by telephone at the time of interpretation on 05/09/2024 at 1:18 pm to Meliton Monte, MD, who verbally acknowledged these results. Electronically Signed   By: Limin  Xu M.D.   On: 05/09/2024 13:23   CT Chest High Resolution Result Date: 05/09/2024 CLINICAL DATA:  Interstitial lung disease. EXAM: CT CHEST WITHOUT CONTRAST TECHNIQUE: Multidetector CT imaging of the chest was performed following the standard protocol without intravenous contrast. High resolution imaging of the lungs, as well as inspiratory and expiratory imaging, was performed. RADIATION DOSE REDUCTION: This exam was performed according to the departmental dose-optimization program which includes automated exposure control, adjustment  of the mA and/or kV according to patient size and/or use of iterative reconstruction technique. COMPARISON:  04/17/2024, 11/21/2023. FINDINGS: Cardiovascular: Right IJ Port-Hall Birchard-Cath terminates in the low SVC. Atherosclerotic calcification of the aorta, aortic valve and coronary arteries. Enlarged pulmonic trunk and heart. No pericardial effusion. Mediastinum/Nodes: Calcified mediastinal and hilar lymph nodes. No pathologically enlarged mediastinal or axillary lymph nodes. Hilar regions are difficult to definitively evaluate without IV contrast. Esophagus is grossly unremarkable. Lungs/Pleura: Image quality is degraded by expiratory phase imaging and respiratory motion. Worsening airspace consolidation, peribronchovascular thickening and nodularity as well as ground-glass throughout the left lung. Similarly, worsening consolidation in the posterior segment right upper lobe and right lower lobe. Assessment for underlying interstitial lung disease is prohibitive in this setting. Small loculated right pleural effusion. Trace left pleural fluid. Debris in the airway. Upper Abdomen: Liver margin is irregular. Cholecystectomy. Visualized portions of the liver, gallbladder, adrenal glands, kidneys, spleen, pancreas, stomach and bowel are otherwise grossly unremarkable. No upper abdominal adenopathy. Musculoskeletal: Degenerative changes in the spine. IMPRESSION: 1. Worsening multi lobar pneumonia. 2. Assessment of underlying interstitial lung disease is challenging in this setting. 3. Small loculated right pleural effusion, stable. Trace left pleural fluid. 4. Liver appears cirrhotic. 5. Aortic atherosclerosis (ICD10-I70.0). Coronary artery calcification. 6. Enlarged pulmonic trunk, indicative of pulmonary arterial hypertension. Electronically Signed   By: Newell Eke M.D.   On: 05/09/2024 10:08   VAS US  UPPER EXTREMITY VENOUS DUPLEX Result Date: 05/09/2024 UPPER VENOUS STUDY  Patient Name:  ENIS RIECKE  Date of  Exam:   05/08/2024 Medical Rec #: [id number]        Accession #:    [id number] Date of Birth: 1941-01-13  Patient Gender: M Patient Age:   68 years Exam Location:  [facility] Procedure:      VAS US  UPPER EXTREMITY VENOUS DUPLEX Referring Phys: Mixtli Reno POWELL JR --------------------------------------------------------------------------------  Indications: Edema Limitations: Subcutaneous edema, port placement and poor ultrasound/tissue interface. Comparison Study: No prior UEV on file Performing Technologist: Jody Hill RVT, RDMS  Examination Guidelines: Amorah Sebring complete evaluation includes B-mode imaging, spectral Doppler, color Doppler, and power Doppler as needed of all accessible portions of each vessel. Bilateral testing is considered an integral part of Lavonia Eager complete examination. Limited examinations for reoccurring indications may be performed as noted.  Right Findings: +----------+------------+---------+-----------+----------+--------------------+ RIGHT     CompressiblePhasicitySpontaneousProperties      Summary        +----------+------------+---------+-----------+----------+--------------------+ IJV           Full       Yes       Yes                                   +----------+------------+---------+-----------+----------+--------------------+ Subclavian               Yes       Yes                   patent by                                                              color/doppler     +----------+------------+---------+-----------+----------+--------------------+ Axillary      Full       Yes       Yes                                   +----------+------------+---------+-----------+----------+--------------------+ Brachial      Full       Yes       Yes                                   +----------+------------+---------+-----------+----------+--------------------+ Radial        Full                                                        +----------+------------+---------+-----------+----------+--------------------+ Ulnar         Full                                  not well visualized  +----------+------------+---------+-----------+----------+--------------------+ Cephalic      Full                                                       +----------+------------+---------+-----------+----------+--------------------+ Basilic       Full  Yes       Yes                                   +----------+------------+---------+-----------+----------+--------------------+ Limited visualization of subclavian vein due to port placement. Limited visualization of ulnar veins due to overlying subcutaneous edema.  Left Findings: +----------+------------+---------+-----------+----------+-------+ LEFT      CompressiblePhasicitySpontaneousPropertiesSummary +----------+------------+---------+-----------+----------+-------+ Subclavian    Full       Yes       Yes                      +----------+------------+---------+-----------+----------+-------+  Summary:  Right: No evidence of deep vein thrombosis in the upper extremity. No evidence of superficial vein thrombosis in the upper extremity.  Left: No evidence of thrombosis in the subclavian.  *See table(s) above for measurements and observations.  Diagnosing physician: Norman Serve Electronically signed by Norman Serve on 05/09/2024 at 7:14:14 AM.    Final    DG CHEST PORT 1 VIEW Result Date: 05/08/2024 CLINICAL DATA:  Hypoxia. Acute on chronic respiratory failure. Follow-up exam. EXAM: PORTABLE CHEST 1 VIEW COMPARISON:  05/01/2024 and older studies.  CT, 04/20/2024. FINDINGS: Airspace lung opacities have increased throughout the left lung and at the right lung base compared to the most recent prior exam. There are underlying interstitial lung opacities at are without significant change. Cardiac silhouette is partly obscured by contiguous left lung opacity, but grossly  unchanged. Right anterior chest wall internal jugular Port-Dorthula Bier-Cath is stable. Stable bilateral hilar and mediastinal calcifications and calcified lung granulomata. Probable small effusions. IMPRESSION: 1. Interval worsening of lung aeration since the most recent prior study. Findings consistent with multifocal pneumonia in the proper clinical setting. Electronically Signed   By: Alm Parkins M.D.   On: 05/08/2024 11:42        Scheduled Meds:  benzonatate   100 mg Oral TID   budesonide -glycopyrrolate -formoterol   2 puff Inhalation BID   Chlorhexidine  Gluconate Cloth  6 each Topical Daily   furosemide   60 mg Intravenous Daily   gabapentin   200 mg Oral QHS   guaiFENesin   600 mg Oral BID   insulin  aspart  0-15 Units Subcutaneous TID WC   insulin  aspart  2 Units Subcutaneous TID WC   levothyroxine   50 mcg Oral Q0600   lidocaine   1 patch Transdermal Q24H   midodrine   5 mg Oral TID WC   minocycline   100 mg Oral Daily   montelukast   10 mg Oral QHS   morphine  CONCENTRATE  4 mg Oral Once   pantoprazole   40 mg Oral BID   sodium chloride   2 spray Each Nare TID   sodium chloride  flush  10 mL Intrapleural Q8H   sodium chloride  flush  10-40 mL Intracatheter Q12H   sucralfate   1 g Oral BID   sulfamethoxazole -trimethoprim   1 tablet Oral Once per day on Monday Wednesday Friday   Continuous Infusions:  methylPREDNISolone  (SOLU-MEDROL ) injection 1,000 mg (05/09/24 1433)   piperacillin-tazobactam (ZOSYN)  IV 3.375 g (05/10/24 0611)     LOS: 33 days    Time spent: 45 min critical care time with AHRF requiring HHFNC and new encephalopathy with focal neurologic deficits  Meliton Monte, MD [facility]   To contact the attending provider between 7A-7P or the covering provider during after hours 7P-7A, please log into the web site [email or web address] and access using universal [place] password for that web site. If you do  not have the password, please call the hospital operator.  05/10/2024,  9:26 AM

## 2024-05-10 NOTE — Progress Notes (Signed)
 NAME:  Luis [name], MRN:  [id number], DOB:  Jul 31, 1940, LOS: 33 ADMISSION DATE:  04/07/2024, CONSULTATION DATE:  10/30 REFERRING MD:  KC-TRH, CHIEF COMPLAINT:  acute resp failure   History of Present Illness:  Luis [name] is an 83 y/o gentleman with a history of pulmonary sarcoidosis on chronic steriods, COPD-asthma overlap on triple inhaled therapy & Dupixent , stage IV cecal adenocarcinoma being treated at [facility] since 2019 who presented to the hospital with progressive shortness of breath and dry cough over 4 days on 04/07/2024.  He had been seen in pulmonary clinic today following his symptoms starting on 7/23.  He had an exacerbation of his respiratory disease in March and September of this year.  Since his exacerbation in September he had gone back on prednisone , tapering from 30 mg to 20 mg to 10 mg daily.  When he got back down to 10 mg daily dose he felt his symptoms flared up again, similar to tapers done in the 1990s when he was on chronic steroids for sarcoidosis.  He has only been on intermittent steroids since 1990s; he has not been on steroid sparing DMARD therapy due to quiescent sarcoidosis over the years.  He has not had sick contacts recently.  No new symptoms other than some leg swelling consistent with hospital.  No fevers.  He has had green sputum production in the hospital, but did not have sputum at home.  He is not on home oxygen.  He last had chemotherapy 2 weeks ago-pembrolizumab and 5-fluorouracil .  He felt well after this for a few days before starting to feel run down.  His meds are not to take primary support.  He is on Eliquis  for DVT diagnosed in late July 2025 & Afib.  Patient hospital because required 4 to 6 L nasal cannula.  He has been maintained on azithromycin , ceftriaxone , prednisone  40mg  daily. Overall he felt a little better yesterday but is more dyspneic again today.   Pertinent  Medical History  Afib, recent cardioversion Metastatic adenocarcinoma of  cecum Pulmonary sarcoidosis diagnosed by open lung biopsy in 1990s COPD asthma overlap Coronary artery disease HTN HLD  cOLON CANCER HX As of Oct 2025 -from Oct 2025 [facility] notes   Luis [name] is an 83 y.o. male with metastatic adenocarcinoma of the cecum, RAS/BRAF WT, MSS.  ONCOLOGY HISTORY   06/20/17 - Colonoscopy - digital rectal exam. Malignant tumor in the cecum. Biopsied. Diverticulosis in the entire examined colon. 06/22/17 - CT CAP - Large masslike appearance in the cecum approximately 6.4 by 5.0 cm. Several small pericecal lymph nodes are present. No pathologic retroperitoneal adenopathy or hepatic metastatic lesions are identified. No other distant metastatic lesions are observed. Extensive findings of old granulomatous disease in the chest including prominent calcified mediastinal and hilar/perihilar lymph nodes as well as scattered pulmonary granulomas and some chronic scattered scarring.  07/26/2017 Right colectomy. Path: adenocarcinoma of the cecum, 0/16 nodes involved, no LVI or PNI. MSI stable, no loss of mismatch repair protein expression. stage IIA (T3N0),  07/26/2018 Colonoscopy - to the ileocolonic anastomosis, unremarkable 01/2019 - CEA peaked at 6.8 02/07/2019 CT: Development of bilateral hepatic metastasis. Segment 7/8 5.4 x 5.2 cm lesion. Segment 6 mass measures 5.8 x 5.6 cm. Subtle segment 2 lesion measures 3.8 x 3.4 cm on 50/2.  03/04/2019 Liver biopsy: metastatic adenocarcinoma 03/12/2019 - 07/30/2019 FOLFOX x 10 cycles 05/23/19 - CT AP - Interval decrease of multiple hepatic metastases. Otherwise stable with no new or progressive findings in  the abdomen/pelvis to suggest new metastatic involvement.  08/13/19 - CT AP - Multifocal hepatic metastases, stable versus mildly improved. Status post right hemicolectomy. Brachytherapy seeds in the prostate.  08/21/19 - MRI liver - A total of 3 suspicious liver metastases are identified involving segment 2, segment 7, and segment 6.  When compared with the exam from 08/13/2019 lesions within segment 7 and segment 6 appear increased in size. Status post cholecystectomy.  09/17/2019-11/19/2019 FOLFIRI/Avastin  x 5 cycles --> Stable disease 11/27/19 - CT AP - 3 previously described index lesions in the liver are essentially stable with no generalized trend towards improvement or progression. No new liver lesion on today's study.  12/03/2019-04/29/2020 FOLFIRI/Panitumumab  x 10 cycles  02/06/20 - CT AP - Interval decreased in size of index lesions within the right hepatic lobe. The subtle lesion within the lateral segment of left hepatic lobe is not confidently identified on today's study.  04/14/20 - CEA 2.1 05/19/2020 MRI liver: Lesion in the posterior RIGHT hepatic lobe, lateral RIGHT hepatic lobe on precontrast imaging measuring approximately 2.9 x 2.2 cm as compared to 4.0 x 3.3 cm in March of 2021. 2.3 x 1.8 cm on post-contrast images this is at the boundary of hepatic subsegment VII and VIII. No appreciable internal enhancement. Study limited by artifact in the anterior liver particularly on the LEFT with stable appearance of a small area the anterior LEFT hepatic lobe, hepatic subsegment II. Area in hepatic subsegment VI not well evaluated better seen on recent abdomen and pelvis CT, grossly stable compared to imaging from November. 05/26/20 - CTA chest - Postoperative change on the right. Consolidation in a portion of the right upper lobe with associated cicatrization is stable and may represent chronic obstructive disease from sarcoidosis.  06/30/2020 Initial CT for the planned image guided ablation of right-sided liver lesions demonstrates significant interval enlargement of the 2 right-sided liver lesion targets, now estimated maximum diameter 3.8 cm (segment 7), and 5.0 cm (segment 6), as well as interval recurrence of the left sided segment 2 lesion, estimated 4.5 cm. Ablation was deferred given the findings. 07/23/2020 Diagnostic  laparoscopy, robot-assisted insertion of hepatic artery infusion pump (Intera 3000, 30 mL reservoir, flow rate 1.3 ml/day), portal lymphadenectomy, core tumor biopsy 08/02/20-10/2020 08/02/2020 initiated c1 FUDR  @ 50%. CEA 4.2 08/16/2020, start FOLFIRI/PMAb. 08/30/2020 CEA 2. FUDR  100% #2  09/27/20, FUDR  100% #3  10/10/2020 Signatera negative  10/25/20, CT c/a/p- Significantly decreased size of hepatic metastases. No new metastatic disease.  01/06/2021 Diagnostic laparoscopy, robot-assisted partial hepatectomy (segment 2, segment 6), microwave ablation (segment 7), intraoperative ultrasound  02/2021 Signatera negative  03/28/2021 CT C/A/P notes stable imaging within the liver, slight increase in right pleural effusion. CEA 1.5 05/16/21, CT c/a/p- Increasing ill defined low attenuation about the post-treatment changes in segment 7 concerning for recurrent disease. MRI A/P- Posttreatment changes of the liver with increased heterogeneous hypoenhancement adjacent to the hepatic segment 7 ablation cavity suspicious for local recurrence. 08/08/21, Stable post treatment changes in liver. Segment 7 ablation cavity appears unchanged. No new metastatic disease. Increasing pleural effusion.  10/17/2021, CT c/a/p- Interval increase in left hepatic lobe segment 3 liver lesion measuring up to 2 cm concerning for worsening metastatic disease. Signatera Positive  11/17/2021 completed 60 Gy in 8 fractions to segment 3 liver lesion 01/02/2022 CT C/A/P stable. CEA 2.0. Signatera + 9.56 02/27/2022 Signatera + 3.47 03/17/22 Thoracentesis of pleural effusion 03/27/2022 CT C/A/P: Increase in size of the hypoattenuating lesion along the deep margin of the previously treated  lesion in the right hepatic lobe. CEA 3.6 04/04/22, MRI A/P- Increased size of a heterogeneous lesion along the deep margin of a previously treated lesion in the right hepatic lobe, with appearance compatible with recurrent metastatic disease. Posttreatment changes in the  left hepatic lobe with wedge-shaped area of heterogeneous enhancement, favoring post procedural changes. Small right pleural effusion without significant change from prior study.  04/17/22-08/20/22 5FU + PMAb+ FUDR  --> clearance of ctDNA 04/17/22, FUDR  #1 @ 100%= 252mg  + 5FU Signatera up 47 05/01/22, PMAb added to FUDR  and 5FU 05/15/2022 5-FU with FUDR  #2 100%=252 mg 06/14/2022 C3 FUDR  100%= 252 mg 06/27/2022 CT C/A/P: Less conspicuous appearing heterogeneous focus along the deep margin of the previously treated segment 7 lesion. New small low attenuating indeterminate cystic lesion in the left hepatic lobe. 06/27/2022 Pmab decreased to 3mg /kg. CtDNA negative. 07/10/2022 FUDR  #4 @100 %= 252 mg 07/24/22, Glycerin flush + 5FU + PmAb  08/21/22, CT c/a/p- Slightly decreased conspicuity of enhancing portion along the deep margin of the right hepatic lobe treatment cavity. New subcentimeter hypodensity in the medial left hepatic lobe. Previously described subcentimeter cystic lesion left hepatic lobe has decreased in size. Stable small to moderate right pleural effusion.  10/09/22, MRI Abd- Heterogeneity of the left hepatic lobe without definite MRI correlate for the previously seen lesion within the left hepatic lobe. Recommend attention on follow-up. Postoperative changes within segment 6 with stable size of an hypoenhancing area along the deep margin of the resection cavity 01/01/23 CT c/a/p- Increased size of hypoenhancing portion along the deep margin of the right hepatic lobe treatment cavity, now measuring up to 3.8 cm, concerning for recurrent disease.  8/19-8/23/24 liver SBRT 4500cGy 03/19/2023 CT C/A/P: slightly decreased size of right hepatic lobe lesion; increased RLL pulmonary nodule now measuring 1.3cm. CEA 3.5 04/12/23, PET-CT Intense hypermetabolic activity in the posterior right hepatic lobe, concerning for viable metastatic disease. A smaller focus of marked activity in the left hepatic lobe is also  concerning. Intense hypermetabolic activity associated with the enlarging right lower lobe pulmonary nodule, concerning for metastatic disease. Other scattered pulmonary opacities have less intense avidity and are stable over multiple prior exams, suspect related to patient's history of sarcoidosis. 05/07/2023-06/25/23 FOLFIRI, Pmab, FUDR   07/09/2023 CT C/A/P: hepatic lesions, decrease in RLL nodule, small left pleural effusion. CEA 2.7. Hold tx  07/2023 SBRT to lung metastasis. 09/06/23, CT c/a/p- Increase in size of hepatic lesions. Stable right upper lobe nodular opacity. Intralobular septal thickening raising the question of lymphangitic spread 09/24/2023-01/30/24 FOLFOX/Pmab 10/08/23 5FU alone due to rash, fatigue 10/22/23 Resume FOLFOX PMAB with dose reductions for tolerability. 12/06/23 CT C/A/P decrease in hepatic metastatic disease. Transition to 5FU + PMAB 01/31/24 CT C/A/P stable R pulm nodule and hepatic disease. Increased opacity in the RLL is non-specific 01/31/24-current 5FU/PMAb  The patient's disease status is up-to-date as described in the oncology history above.    Significant Hospital Events: Including procedures, antibiotic start and stop dates in addition to other pertinent events   10/26 admitted to the hospital, started on ceftriaxone  and azithromycin , prednisone  40 mg daily 10/29 completed azithromycin  10/30 stopped ceftriaxone  04/11/24 BRONCH BAL - culture negative 34% LYMPHS (in 2022 was pleural fluid had 93% lymphis) 11/5 desaturation at 0400 overnight, transition to 4 L/min.  Then up to 10 L/min when he was moved to chair.  Now weaned to 6 L/min 11/6 CT-PA chest >> no PE, calcified hilar and mediastinal nodes, new mediastinal air without any evidence for pneumothorax.  Right lung  base infiltrate unchanged, some ground glass on the left unchanged 11/9 lasix  held due to borderline hypotension 11/10 extra lasix  11/13 - On 4L saturating 100%. . Patient is dyspneic.  On 4 L of  oxygen.  He says he was not using any oxygen prior to coming to the hospital.  When he saw NP Hope mid-October his breathing was well and he was able to walk multiple blocks without any difficulty. 11/14: Hemodynamically stable, hemoglobin with some decreased 11.5, improving creatinine.  Becoming little more short of breath and hypoxic when stood for standing weight.  Giving 1 dose of IV Lasix  at 20 mg and restarting home Lasix . Pending formal cardiology consult regarding the use of Multaq . 11/15: Blood pressure started trending up so holding midodrine  and placed parameters.  Multaq  was discontinued by cardiology and if he goes back in a flutter, he will need DCCV. 11/17 patient very weak and deconditioned  11/18 continue to have dyspnea.  11/25 - pall care consult recommended DNR/DNI but remains full code.   Continues to require HHFNC with sats 89-95%.    SSEROIDS : Continue systemic steroid tx with plan for slow prolonged prednisone  taper as per Pulmonary [Prednisone  30 mg daily for 7 days, Prednisone  20 mg daily for 7 days, Prednisone  15 mg daily for 7 days, Prednisone  10 mg daily for 7 days, Prednisone  5 mg daily for 7 days, then stop] - as noted above progress is marked by staggered moments of acute worsening   11/27 - recalled by [facility] MD for consult. Per [facility] MD: on 11/13, he was on 6 L - on 11/19 he was increased to [facility]. Currentl on HHFNC 65% fipo2 and 40L flow rate.  AFebrile. Is on prednisone  taper. Is on eliquis  even prior to admit    11/28 - significant worseing of o2 needs. 100% fio2, 60LHHFNC + face mask. Reqwuiring frequent suctioniung. CT chest with worsening pneumonitis but no pneumomediastium. Procal is low at 1 but ESR is > 90. HE was on pred taper.  Chart review shows On Keytruda x 2 years   ADDENDUM 12:32 PM - HAS NEW LEFT PTX since  yesterday and s/p chest tube     SUBJECTIVE/OVERNIGHT/INTERVAL HX    11/29 - s/p solumerdol puls dose since yesterday. No improvement in  hypoemia. Confused and [facility] concerned about asymteric pupils. PFamily refused CT head. Indicated desire to ful comfort. Wife and dauther at [place]. Patient talking to his deceased parents . Wife feels he is oreitned. QUite labored. Wife reluctant about opioids (esp fent) given ability to sedatd. Shared discussion - she is ok for 1 dose of oral morphine  till pallitaive comes to bedside   Objective    Blood pressure (!) 154/87, pulse 100, temperature 97.8 F (36.6 C), temperature source Oral, resp. rate (!) 23, height 5' 11 (1.803 m), weight 86.6 kg, SpO2 99%.    FiO2 (%):  [90 %-100 %] 90 %   Intake/Output Summary (Last 24 hours) at 05/10/2024 1232 Last data filed at 05/10/2024 9388 Gross per 24 hour  Intake 88.69 ml  Output 860 ml  Net -771.31 ml   Filed Weights   05/06/24 0607 05/08/24 0603 05/09/24 0407  Weight: 81.5 kg 87.7 kg 86.6 kg    General Appearance:  Looks criticall ill  Head:  Normocephalic, without obvious abnormality, atraumatic Eyes:  PERRL - yes, conjunctiva/corneas - muddy     Ears:  Normal external ear canals, both ears Nose:  G tube - no but hHFNC Throat:  ETT  TUBE - no , OG tube - no Neck:  Supple,  No enlargement/tenderness/nodules Lungs: Clear to auscultation bilaterally, Clas 4 dysoena. Labored. PAradoxical. Left chest ube in place with suction - slightly blody drainge Heart:  S1 and S2 normal, no murmur, CVP - no.  Pressors - no Abdomen:  Soft, no masses, no organomegaly Genitalia / Rectal:  Not done Extremities:  Extremities- intact Skin:  ntact in exposed areas . Sacral area - not examine Neurologic:  Sedation - none -> Alert. Talking o his parent      Anti-infectives (From admission, onward)    Start     Dose/Rate Route Frequency Ordered Stop   05/09/24 1415  piperacillin-tazobactam (ZOSYN) IVPB 3.375 g  Status:  Discontinued        3.375 g 12.5 mL/hr over 240 Minutes Intravenous Every 8 hours 05/09/24 1317 05/10/24 1109   04/30/24 0900   sulfamethoxazole -trimethoprim  (BACTRIM ) 400-80 MG per tablet 1 tablet  Status:  Discontinued        1 tablet Oral Once per day on Monday Wednesday Friday 04/29/24 1649 05/10/24 1109   04/21/24 1000  minocycline  (MINOCIN ) capsule 100 mg  Status:  Discontinued        100 mg Oral Daily 04/15/24 1245 05/10/24 1109   04/13/24 2200  doxycycline  (VIBRA -TABS) tablet 100 mg        100 mg Oral Every 12 hours 04/13/24 1241 04/20/24 2227   04/12/24 1800  cefTRIAXone  (ROCEPHIN ) 2 g in sodium chloride  0.9 % 100 mL IVPB        2 g 200 mL/hr over 30 Minutes Intravenous Every 24 hours 04/12/24 1655 04/20/24 1739   04/12/24 1800  doxycycline  (VIBRAMYCIN ) 100 mg in sodium chloride  0.9 % 250 mL IVPB  Status:  Discontinued        100 mg 125 mL/hr over 120 Minutes Intravenous Every 12 hours 04/12/24 1655 04/13/24 1240   04/08/24 1000  azithromycin  (ZITHROMAX ) tablet 500 mg        500 mg Oral Daily 04/07/24 0238 04/09/24 0852   04/08/24 0000  cefTRIAXone  (ROCEPHIN ) 1 g in sodium chloride  0.9 % 100 mL IVPB        1 g 200 mL/hr over 30 Minutes Intravenous Every 24 hours 04/07/24 0238 04/11/24 0815   04/07/24 1000  minocycline  (MINOCIN ) capsule 100 mg  Status:  Discontinued        100 mg Oral Daily 04/07/24 0241 04/15/24 1204   04/07/24 0130  cefTRIAXone  (ROCEPHIN ) 2 g in sodium chloride  0.9 % 100 mL IVPB        2 g 200 mL/hr over 30 Minutes Intravenous  Once 04/07/24 0124 04/07/24 0255   04/07/24 0130  azithromycin  (ZITHROMAX ) 500 mg in sodium chloride  0.9 % 250 mL IVPB        500 mg 250 mL/hr over 60 Minutes Intravenous  Once 04/07/24 0124 04/07/24 9662      Allergies  Allergen Reactions   Losartan  Potassium Other (See Comments)    Hyperkalemia       Resolved problem list  Pneumomediastinum, new - - Improving pneumomediastinum.as off 04/24/24. REslved on C 05/08/24 Assessment and Plan   Acute hypoxic respiratory failure: Ground glass opacities likely due to checkpoint inhibitor pneumonitis due to  chronicity versus pulmonary edema: History of asthma on Dupixent : Stable sarcoidosis: Hypoalbuminemia:  11/27: Appears hypxemia worse sine last PCCM visit 04/24/24. THis is despite oral prednisone . BAL showed > 30% lymphis but culture negaive. Doubt PE (alread on eliquis ) Wosening is worrisome  for permanent lung injury  11/28 -CT worse, ESR HIGH. 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 ? Worse with steroid taper versus . steroid resistant. Only feature is delayed onset . Now critically ill and severe resp failure and distress. DDx HCAP . At high risk for death x 24h  2024-06-09- no improvement  PLAN  - continue soluemedriol S pulse dose 1gm x 3 days since 05/09/24 - oral morphine  4mg  x  1 dose for dyspnea releive - abx as above ++ but start empiric zosyn  - Frequent suctioning -= - O2 for pulse ox > 88% - DNR - no CPR, No intubation (See iPAL) but goal is to improve and get him home  - Concurrent palliation (d/w Ronal Plants and Dr Perri Raddle of [facility])  - Full comfort if worsens (See iPAL) - heading there 09-Jun-2024 - if so dc all meds    LEFT PTX - news/p chest tube 05/09/24  Plan  - chest tube to suction  - does not change other plan  Alereted mental status  - could be steroids v bleed v terminal decline  Plan  - per [facility]  CCM will sign off     SIGNATURE    Dr. Dorethia Cave, M.D., F.C.C.P,  Pulmonary and Critical Care Medicine Staff Physician, [facility] Center Director - Interstitial Lung Disease  Program  Pulmonary Fibrosis [facility] Network at [facility] [place], [name], [address]   Pager: [phone], If no answer  -> Check AMION or Try [phone] Telephone (clinical office): [phone] Telephone (research): [phone]  12:37 PM 2024-06-09    CBC Recent Labs  Lab 05/08/24 0410 05/09/24 0501 06/09/2024 0515  HGB 9.9* 9.5* 10.3*  HCT 32.2* 30.2* 34.8*  WBC 13.0* 11.5* 15.0*  PLT 106* 101* 114*     COAGULATION No results for input(s): INR in the last 168 hours.  CARDIAC  No results for input(s): TROPONINI in the last 168 hours. No results for input(s): PROBNP in the last 168 hours.   CHEMISTRY Recent Labs  Lab 05/05/24 0500 05/06/24 1105 05/07/24 0530 05/08/24 0410 05/09/24 0501 2024-06-09 0515  NA 136 131* 133* 133* 133* 134*  K 3.8 5.0 5.0 4.4 4.0 5.1  CL 86* 87* 87* 86* 87* 87*  CO2 38* 35* 35* 36* 37* 36*  GLUCOSE 151* 187* 206* 128* 115* 183*  BUN 19 22 28* 24* 17 25*  CREATININE 0.73 0.97 0.96 0.81 0.78 1.08  CALCIUM  8.1* 8.4* 8.9 8.6* 8.3* 8.3*  MG 1.6* 1.7 1.8  --   --  1.8  PHOS 3.0 3.3 3.3 3.7 3.8 6.7*   Estimated Creatinine Clearance: 55.2 mL/min (by C-G formula based on SCr of 1.08 mg/dL).   LIVER Recent Labs  Lab 05/06/24 1105 05/07/24 0530 05/08/24 0410 05/09/24 0501 06/09/2024 0515  AST  --   --  73*  --   --   ALT  --   --  61*  --   --   ALKPHOS  --   --  1,246*  --   --   BILITOT  --   --  1.6*  --   --   PROT  --   --  4.9*  --   --   ALBUMIN  2.0* 2.1* 1.9*  1.9* 1.8* 2.0*     INFECTIOUS Recent Labs  Lab 05/08/24 1533 05/09/24 0501  PROCALCITON 1.03 1.10     ENDOCRINE CBG (last 3)  Recent Labs  05/09/24 1501 05/09/24 1720 05/10/24 0654  GLUCAP 155* 151* 193*         IMAGING x48h  - image(s) personally visualized  -   highlighted in bold DG CHEST PORT 1 VIEW Result Date: 05/10/2024 EXAM: 1 VIEW(S) XRAY OF THE CHEST 05/10/2024 05:37:00 AM COMPARISON: Portable chest 05/09/2024 02:18 PM. CLINICAL HISTORY: Pneumothorax. FINDINGS: LINES, TUBES AND DEVICES: Right chest port catheter terminates in the distal SVC. Pigtail tube thoracostomy unchanged in positioning with the pigtail in the lateral upper left thorax. LUNGS AND PLEURA: Left hydropneumothorax with small hydro component, apical pneumo component estimated at 10% of the left chest volume, unchanged. Extensive airspace disease in the left lung. Patchy  consolidation in the right base. Small right pleural effusion. The rest of the right lung is clear of infiltrate. Scattered calcified granulomas. Calcified hilar lymph nodes. Some underlying central edema may be present. HEART AND MEDIASTINUM: Cardiomegaly. Calcified mediastinal lymph nodes. Stable mediastinum. BONES AND SOFT TISSUES: No acute osseous abnormality. IMPRESSION: 1. Unchanged Left hydropneumothorax with small hydro component and apical pneumo component estimated at 10% of the left chest volume, unchanged. 2. Extensive airspace disease in the left lung and patchy consolidation in the right base. 3. Small right pleural effusion. 4. Cardiomegaly with possible underlying central edema. Electronically signed by: Francis Quam MD 05/10/2024 06:28 AM EST RP Workstation: [id number]   DG CHEST PORT 1 VIEW Result Date: 05/09/2024 CLINICAL DATA:  [id number] Encounter for chest tube placement [id number] EXAM: PORTABLE CHEST - 1 VIEW COMPARISON:  05/09/2024 12:42 FINDINGS: Similar interstitial opacities throughout the left lung with patchy opacities in both lung bases. Blunting of both costophrenic sulci is also again unchanged. Interval placement of a percutaneous left pigtail thoracostomy tube with re-expansion of the lung and no significant pneumothorax visualized. Moderate cardiomegaly. Right chest port terminating at the cavoatrial junction, unchanged. IMPRESSION: Interval placement of a left-sided thoracostomy tube with re-expansion of the lung and no significant pneumothorax visualized. Otherwise, no significant interval change to the airspace disease and bilateral pleural effusions. Electronically Signed   By: Rogelia Myers M.D.   On: 05/09/2024 14:46   DG CHEST PORT 1 VIEW Result Date: 05/09/2024 CLINICAL DATA:  Shortness of breath EXAM: PORTABLE CHEST 1 VIEW COMPARISON:  Chest radiograph and CT chest dated 05/08/2024 FINDINGS: Lines/tubes: Right chest wall port tip projects over the SVC. Lungs: Interval  improved left lung aeration. Decreased but persistent right lower and diffuse left lung opacities. Pleura: New small left pneumothorax. Similar small right pleural effusion. Decreased trace left pleural effusion. Heart/mediastinum: Similar enlarged cardiomediastinal silhouette. Bones: No acute osseous abnormality. IMPRESSION: 1. New small left pneumothorax. 2. Interval improved left lung aeration. Decreased but persistent right lower and diffuse left lung opacities. 3. Similar small right pleural effusion. Decreased trace left pleural effusion. Critical Value/emergent results were called by telephone at the time of interpretation on 05/09/2024 at 1:18 pm to Meliton Monte, MD, who verbally acknowledged these results. Electronically Signed   By: Limin  Xu M.D.   On: 05/09/2024 13:23   CT Chest High Resolution Result Date: 05/09/2024 CLINICAL DATA:  Interstitial lung disease. EXAM: CT CHEST WITHOUT CONTRAST TECHNIQUE: Multidetector CT imaging of the chest was performed following the standard protocol without intravenous contrast. High resolution imaging of the lungs, as well as inspiratory and expiratory imaging, was performed. RADIATION DOSE REDUCTION: This exam was performed according to the departmental dose-optimization program which includes automated exposure control, adjustment of the mA and/or kV according to patient size and/or use of iterative  reconstruction technique. COMPARISON:  04/17/2024, 11/21/2023. FINDINGS: Cardiovascular: Right IJ Port-A-Cath terminates in the low SVC. Atherosclerotic calcification of the aorta, aortic valve and coronary arteries. Enlarged pulmonic trunk and heart. No pericardial effusion. Mediastinum/Nodes: Calcified mediastinal and hilar lymph nodes. No pathologically enlarged mediastinal or axillary lymph nodes. Hilar regions are difficult to definitively evaluate without IV contrast. Esophagus is grossly unremarkable. Lungs/Pleura: Image quality is degraded by expiratory  phase imaging and respiratory motion. Worsening airspace consolidation, peribronchovascular thickening and nodularity as well as ground-glass throughout the left lung. Similarly, worsening consolidation in the posterior segment right upper lobe and right lower lobe. Assessment for underlying interstitial lung disease is prohibitive in this setting. Small loculated right pleural effusion. Trace left pleural fluid. Debris in the airway. Upper Abdomen: Liver margin is irregular. Cholecystectomy. Visualized portions of the liver, gallbladder, adrenal glands, kidneys, spleen, pancreas, stomach and bowel are otherwise grossly unremarkable. No upper abdominal adenopathy. Musculoskeletal: Degenerative changes in the spine. IMPRESSION: 1. Worsening multi lobar pneumonia. 2. Assessment of underlying interstitial lung disease is challenging in this setting. 3. Small loculated right pleural effusion, stable. Trace left pleural fluid. 4. Liver appears cirrhotic. 5. Aortic atherosclerosis (ICD10-I70.0). Coronary artery calcification. 6. Enlarged pulmonic trunk, indicative of pulmonary arterial hypertension. Electronically Signed   By: Newell Eke M.D.   On: 05/09/2024 10:08   VAS US  UPPER EXTREMITY VENOUS DUPLEX Result Date: 05/09/2024 UPPER VENOUS STUDY  Patient Name:  KRYSTAL DELDUCA  Date of Exam:   05/08/2024 Medical Rec #: [id number]        Accession #:    [id number] Date of Birth: 08-28-1940       Patient Gender: M Patient Age:   65 years Exam Location:  [facility] Procedure:      VAS US  UPPER EXTREMITY VENOUS DUPLEX Referring Phys: A POWELL JR --------------------------------------------------------------------------------  Indications: Edema Limitations: Subcutaneous edema, port placement and poor ultrasound/tissue interface. Comparison Study: No prior UEV on file Performing Technologist: Jody Hill RVT, RDMS  Examination Guidelines: A complete evaluation includes B-mode imaging, spectral Doppler, color  Doppler, and power Doppler as needed of all accessible portions of each vessel. Bilateral testing is considered an integral part of a complete examination. Limited examinations for reoccurring indications may be performed as noted.  Right Findings: +----------+------------+---------+-----------+----------+--------------------+ RIGHT     CompressiblePhasicitySpontaneousProperties      Summary        +----------+------------+---------+-----------+----------+--------------------+ IJV           Full       Yes       Yes                                   +----------+------------+---------+-----------+----------+--------------------+ Subclavian               Yes       Yes                   patent by                                                              color/doppler     +----------+------------+---------+-----------+----------+--------------------+ Axillary      Full       Yes  Yes                                   +----------+------------+---------+-----------+----------+--------------------+ Brachial      Full       Yes       Yes                                   +----------+------------+---------+-----------+----------+--------------------+ Radial        Full                                                       +----------+------------+---------+-----------+----------+--------------------+ Ulnar         Full                                  not well visualized  +----------+------------+---------+-----------+----------+--------------------+ Cephalic      Full                                                       +----------+------------+---------+-----------+----------+--------------------+ Basilic       Full       Yes       Yes                                   +----------+------------+---------+-----------+----------+--------------------+ Limited visualization of subclavian vein due to port placement. Limited visualization of ulnar veins  due to overlying subcutaneous edema.  Left Findings: +----------+------------+---------+-----------+----------+-------+ LEFT      CompressiblePhasicitySpontaneousPropertiesSummary +----------+------------+---------+-----------+----------+-------+ Subclavian    Full       Yes       Yes                      +----------+------------+---------+-----------+----------+-------+  Summary:  Right: No evidence of deep vein thrombosis in the upper extremity. No evidence of superficial vein thrombosis in the upper extremity.  Left: No evidence of thrombosis in the subclavian.  *See table(s) above for measurements and observations.  Diagnosing physician: Norman Serve Electronically signed by Norman Serve on 05/09/2024 at 7:14:14 AM.    Final

## 2024-05-10 NOTE — Plan of Care (Addendum)
 Called to evaluate patient for altered mental status.  Upon arrival, patient's wife stated that she wished to transition patient to comfort care per his stated wishes.  Patient was sleeping, and wife stated that she did not wish for him to be woken up for an examination.  Patient's wife also stated that she would not like to have CT scan, as results would not change treatment plan.

## 2024-05-10 NOTE — Progress Notes (Signed)
 PT Cancellation Note  Patient Details Name: Luis [name] MRN: [id number] DOB: Feb 27, 1941   Cancelled Treatment:    Reason Eval/Treat Not Completed: (P) Medical issues which prohibited therapy (nursing reports decline in medical status and possible comfort care.  Will defer PT at this time.)   [name] [name] 05/10/2024, 10:24 AM  Toya HAMS , PTA Acute Rehabilitation Services Office [phone]

## 2024-05-10 NOTE — Progress Notes (Addendum)
 Progress Note  Patient Name: Luis [name] Date of Encounter: 05/10/2024 [facility] Cardiologist: [name], MD   Interval Summary   HRCT showed worsening multilobar pneumonia with small loculated right pleural effusion, cirrhotic liver, coronary artery calcifications  Yesterday had increased secretions with worsening respiratory status and increased O2 requirements.  O2 sats today 99% on high flow nasal cannula 60/min  Acute change in Mentation this am and now has right gaze preference and very confused.  Pupils asymmetric.  Patient told wife he thinks he is dying and wants her to let him go  Vital Signs Vitals:   05/09/24 2355 05/10/24 0324 05/10/24 0749 05/10/24 0900  BP: 125/71 130/71  (!) 155/86  Pulse: 89 91  99  Resp: 14 13  14   Temp: 97.6 F (36.4 C) 98.1 F (36.7 C)  97.8 F (36.6 C)  TempSrc: Oral Oral  Oral  SpO2: 99% 99% 100% 99%  Weight:      Height:        Intake/Output Summary (Last 24 hours) at 05/10/2024 0937 Last data filed at 05/10/2024 9388 Gross per 24 hour  Intake 88.69 ml  Output 860 ml  Net -771.31 ml      05/09/2024    4:07 AM 05/08/2024    6:03 AM 05/06/2024    6:07 AM  Last 3 Weights  Weight (lbs) 190 lb 14.7 oz 193 lb 5.5 oz 179 lb 11.2 oz  Weight (kg) 86.6 kg 87.7 kg 81.511 kg     Telemetry/ECG  Normal sinus rhythm- Personally Reviewed  Physical Exam  GEN: Well nourished, well developed  remains on HFNC HEENT: Normal NECK: No JVD; No carotid bruits LYMPHATICS: No lymphadenopathy CARDIAC:RRR, no murmurs, rubs, gallops RESPIRATORY:  Clear to auscultation without rales, wheezing or rhonchi  ABDOMEN: Soft, non-tender, non-distended MUSCULOSKELETAL:  No edema; No deformity  SKIN: Warm and dry NEUROLOGIC:  Alert and oriented x 3 PSYCHIATRIC:  Normal affect    Assessment & Plan   Acute on chronic HFpEF Orthostatic hypotension  Presented with worsening shortness of breath Orthostatic hypotension limits GDMT and  aggressive diuresis  Incomplete I&O's this admission but out 1 L yesterday and net -14.4 L since admission Low albumin  on labs (albumin  1.8-2) Weight 179 lb ? 193 lb --> 190 lbs today>>suspect that the 179lb weight may be erroneous Renal function stable (serum creatinine 1.08, K+ 5.1) No LE edema but given his PNA would recommend try to keep on the dry side Continue diuretics with Lasix  60 mg IV daily and consider addition of albumin  (significantly low albumin  likely contributing to third spacing) BP stable on midodrine  5 mg 3 times daily which we will continue Continue to follow strict I's and O's, daily weights and renal function while diuresing   Paroxysmal atrial fibrillation  Bifascicular block Underwent DCCV 01/2024 Maintaining normal sinus rhythm in the 90s Continue Eliquis  5 mg twice daily No beta-blocker or CCB due to orthostatic hypotension   CAD s/p PCI to ramus, LAD, diagonal  Hyperlipidemia  Transaminitis Denies chest pain Home statin and Zetia  held in setting of elevated LFTs Repeat LFTs in a.m. HRCT showed liver cirrhosis   Acute hypoxic respiratory failure Acute bronchospastic COPD exacerbation Community acquired PNA Pulmonary sarcoidosis Pneumomediastinum Shortness of breath clearly multifactorial as above  Now down to 16 L via HHFNC  PCCM re-consulted on patient 11/27 High res chest CT yesterday demonstrated worsening multilobar pneumonia with small loculated right pleural effusion Continue to follow PCCM recommendations  Metastatic colon cancer  Noted to have metastatic cancer to liver, lungs S/p liver resection, chemotherapy  LFTs remain elevated and chest CT demonstrates liver cirrhosis Seen by palliative medicine this admission Remains full code Now with acute mental status changes and asymmetric pupils with right sided gaze preference on anticoagulation>>TRH discussing with family if they want to proceed with comfort care vs. Get head imaging  Nothing  more for Cardiology to add at this time in a very ill patient with poor prognosis.  Will sign off.  Call with any questions.   I spent 35 minutes caring for this patient today face to face, ordering and reviewing labs, reviewing records from 2D echo 04/08/2024 and high-resolution chest CT 05/09/2024, seeing the patient, documenting in the record and discussing case with Dr. Perri  For questions or updates, please contact [facility] Please consult [email or web address] for contact info under   Signed, Wilbert Bihari, MD [facility] 05/10/2024 0:46AM

## 2024-05-10 NOTE — Progress Notes (Addendum)
 Palliative Medicine Inpatient Follow Up Note   HPI: 83 year old male admitted on 04/07/2024 with past medical history of metastatic colon cancer with mets to the lung/liver, sarcoidosis, COPD, CAD status post PCI, HFpEF, A-flutter status post cardioversion, hypertension, hyperlipidemia, hypothyroidism, presented with progressive worsening of shortness of breath for a week with chest congestion and blood-tinged sputum production.  Patient was admitted and treated for stabilization.  Patient and family face treatment option decisions, advanced directive decisions, and anticipatory care needs.  Patient and family continue to work closely with transition of care for next labs and to transition care, fortunately fortunately patient does not stabilize enough medically to realistically be able to tolerate SNF for rehab at this time.  Patient has been here in the hospital for 33 days now, admitted to the inpatient unit on 04/07/2024  Today's Discussion 05/10/2024   Chart reviewed inclusive of vital signs, progress notes, laboratory results, and diagnostic images.  Critical care progress notes of Dr. Geronimo from 05/09/2024 reviewed, detailing plan of care including newly developed left pneumothorax.  Left-sided chest tube placed 05/09/2024.  Patient is currently receiving steroid pulse dose 1 g x 3 days.  Reviewed palliative medicine note from 05/09/2024, noted that patient was educated for his long-term prognosis secondary to multiple comorbidities and lack of progress within the context of full medical support and treatment plan.  Also noted that patient continues to verbalize openness to all medical interventions to prolong life, he remains hopeful for improvement.  Vital signs reviewed from 05/10/2024 0 900: Blood pressure 155/86, pulse rate 99, respiration rate 14, patient is saturating at 99% on high flow nasal cannula.  Lab results, particularly CBC reviewed from 05/10/2024, WBC 15.0  increased from 13.0 on 05/08/2024, consistent with ongoing leukocytosis, currently receiving IV antibiotic every 8 hours.  Reviewed MAR over the last 24 hours, patient received as needed morphine  5 mg x 2 doses over the last 24 hours.  Also received as needed tramadol  50 mg on 05/09/2024 at 0635.  Over the past several days, the patient has experienced worsening respiratory status and a notable decline in mental status particularly since undergoing thoracentesis yesterday.  Despite ongoing medical interventions, including heated high flow nasal cannula at 60 L/min with 90% FiO2 and chest tube placement for pneumothorax there has been no significant improvement.  He continuously requires high dose of oxygen supplement requirements.  I visited the patient at bedside today. The patient appears chronically ill and is currently receiving high flow nasal cannula oxygen therapy at 60% FiO2.  He is confused, and unable to participate in a meaningful conversation.  Despite supplemental oxygen, he continues to exhibit signs of shortness of breath.   Wife present at bedside, attending physician Dr. Perri was also in attendance.  During our discussion, the patient's wife and Dr. Perri confirmed that the patient and his wife have chosen to pursue a comfort focused care pathway.  The wife expressed that the patient feels ready for this transition and is at peace with the decision, sharing that he has spoken about being ready to join departed loved ones.  She also noted that they had previously discussed his wishes and he had made it clear that he would not want to suffer or undergo aggressive interventions if his health were to decline.  The wife shared that their children and other family members will be coming to spend time with the patient.  I offered to arrange a family meeting later today to further discuss goals of care  and the next steps in the comfort care pathway.  Dr. Perri also acknowledged the patient's  significant decline and indicated that given the current trajectory this is likely to be hospital death.  Update: 2:30PM: This afternoon, I met in person with patient's wife Sari, son Rankin, and daughter Lucie to discuss goals of care and determine the most appropriate treatment pathway in light of patient's ongoing health decline.  We reviewed patient's current clinical status including his significant disease and symptom burden related to acute on chronic respiratory failure new left sided pneumohydrothorax, and worsening cognitive function.  The family demonstrated clear understanding that the patient's prognosis is extremely poor, both in the short and long-term, and that meaningful recovery is highly unlikely.  The wife again shared, with the presence of her children, that she and the patient had previously discussed their wishes should his health deteriorate.  They had agreed to pursue comfort focused care and to avoid extraordinary or heroic interventions.  She also noted that patient appears to be exhibiting end-of-life signs, such as communicating with loved ones who have already passed away.   Wife reflected that the patient has had a fulfilling life, and that one of his personal goals was to live twice as long as his father, who died at age of 81.  Note that patient is now 83 years old.  I provided education to the family about comfort focused care pathway, emphasizing the primary goal is to optimize comfort and quality of life while foregoing life-prolonging measures.  We discussed both pharmacologic and nonpharmacologic approaches to symptom management.  The family elected to transition patient to comfort focused care.  I recommended consideration of a morphine  drip to address his significant symptom burden and explained that given his current condition, hospital death is anticipated.  I provided education to the family regarding the philosophy of hospice care, including what hospice  entails and the various settings in which it can be provided.  I also explained that hospice is a federally covered benefit under Medicaid.  The family expressed appreciation for this information and understands that, given patient's significant symptom burden at this point that hospital death may be anticipated.    I provided education to the family regarding end-of-life process and what to expect during this time.  I emphasized the importance of cherish in each movement and encouraged family to take every opportunity to spend meaningful time with the patient.  At the conclusion of the meeting, family appeared at peace with their decision to transition to comfort focused care.  I assured them that I would communicate the updated plan of care with the rest of the medical team.  Created space and opportunity for patient to explore thoughts feelings and fears regarding current medical situation.  Questions and concerns addressed   Palliative Support Provided.   Objective Assessment: Vital Signs Vitals:   05/10/24 0749 05/10/24 0900  BP:  (!) 155/86  Pulse:  99  Resp:  14  Temp:  97.8 F (36.6 C)  SpO2: 100% 99%    Intake/Output Summary (Last 24 hours) at 05/10/2024 0926 Last data filed at 05/10/2024 9388 Gross per 24 hour  Intake 88.69 ml  Output 860 ml  Net -771.31 ml   Last Weight  Most recent update: 05/09/2024  4:15 AM    Weight  86.6 kg (190 lb 14.7 oz)             Gen:  Ill-appearing HEENT: moist mucous membranes CV: Regular rate, HR 99  PULM: Shortness of breath, On O2 supplement via HHFNC at 50% ABD: soft/nontender/nondistended EXT: No edema Neuro: Confused, unable to assess orientation.   SUMMARY OF RECOMMENDATIONS   Code Status: Transition to DNR-Comfort Anticipate hospital death Continue to provide psycho-social and emotional support to patient and family Palliative medicine team will continue to follow.  Comfort cart for family Unrestricted visitations  in the setting of EOL (per policy)  Symptom Management:  Morphine  PRN for pain/air hunger/comfort Morphine  infusion for pain/air hunger/comfort, consider titrating dose as needed Robinul  PRN for excessive secretions Ativan  PRN for agitation/anxiety Zofran  PRN for nausea Liquifilm tears PRN for dry eyes Haldol PRN for agitation/anxiety May have comfort feeding Oxygen for comforts, no escalation, eventual weaning off from [facility], administer benzo/opiate of air hunger.   I personally spent a total of 65 minutes in the care of the patient today including preparing to see the patient, getting/reviewing separately obtained history, performing a medically appropriate exam/evaluation, counseling and educating, placing orders, referring and communicating with other health care professionals, documenting clinical information in the EHR, and coordinating care.    ___________________________________________________________________ [name] Bolder NP-C [facility] Medicine Team Team Cell Phone: [phone] Please utilize secure chat with additional questions, if there is no response within 30 minutes please call the above phone number  Palliative Medicine Team providers are available by phone from 7am to 7pm daily and can be reached through the team cell phone.  Should this patient require assistance outside of these hours, please call the patient's attending physician.

## 2024-05-12 NOTE — Progress Notes (Signed)
 Patient expired at 0220 on 04/19/2024. Jacquline Cooter, RN verified death with Fletcher Dux, RN. Family and on call doctor Dr. Editha Ram was notified of patients death. [facility] was notified referral number is [id number]. Patients wife Sabrina Sink took home patients belongings home yesterday. Funeral home is undecided, patient was transported to morgue.

## 2024-05-12 NOTE — Death Summary Note (Signed)
 DEATH SUMMARY   Patient Details  Name: Luis [name] MRN: [id number] DOB: 1940-12-14 ERE:Yjfmprx, Luis CROME, MD Admission/Discharge Information   Admit Date:  04/25/24  Date of Death: Date of Death: 29-May-2024  Time of Death: Time of Death: 0220  Length of Stay: June 04, 2033   Principle Cause of death: acute hypoxic respiratory failure, checkpoint inhibitor pneumonitis, multifocal pneumonia, sarcoidosis   [name] Diagnoses: Principal Problem:   Acute exacerbation of COPD with asthma (HCC) Active Problems:   Acute on chronic diastolic heart failure (HCC)   CAD (coronary artery disease)   Atrial flutter (HCC)   Essential hypertension   Hypokalemia   Chronic anemia   Type 2 diabetes mellitus with hyperlipidemia (HCC)   Hypothyroidism   Colon cancer (HCC)   Pneumothorax   Acute heart failure with preserved ejection fraction (HFpEF) (HCC)   Orthostatic hypotension   PAF (paroxysmal atrial fibrillation) (HCC)   Transaminitis   [name] Course:  83 year old with [name] history of metastatic colon cancer to the lungs liver and lymph nodes followed at [name] since 06-04-18, pulmonary sarcoidosis on chronic steroids, COPD, CAD, chronic diastolic CHF, HTN, HLD, and hypothyroidism who was admitted to the [name] 25-Apr-2024 after presenting with progressive onset of worsening shortness of breath x 1 week.   Significant Events: 10/31 S/P bronchoscopy BAL culture no growth to date AFB fungus PJP DFA pending. Cytology atypical cells. Continue with prednisone  taper. 11/13: Mildly elevated blood pressure, still significant transaminitis-message sent to Dr. Clancy cardiologist regarding Multaq . Had 1 episode of bleeding while having bowel movement-history of hemorrhoid. 11/14: Hemodynamically stable, hemoglobin with some decreased 11.5, improving creatinine.  Becoming little more short of breath and hypoxic when stood for standing weight.  Giving 1 dose of IV Lasix  at 20 mg and restarting home Lasix . Pending  formal cardiology consult regarding the use of Multaq . 11/15: Blood pressure started trending up so holding midodrine  and placed parameters.  Multaq  was discontinued by cardiology and if he goes back in [name] flutter, he will need DCCV. 11/17 patient very weak and deconditioned  11/18 continue to have dyspnea.  11/20 on [name] 11/27 pulmonology reconsulted 11/28 high res CT with multifocal pna - abx restarted, high dose steroids per pulm.  Acute resp distress, repeat CXR showed L pneumothorax, s/p chest tube placement 28-May-2024 AMS since chest tube placement, asymmetric pupils, right gaze preference.  Initially planned for workup with head CT, CTA, neuro consult.  But his wife decided to transition to comfort measures.    Appreciate palliative assistance, he was transitioned to comfort measures May 28, 2024.  Passed away 2024-05-29 am.  Called May 30, 2024 to offer condolences to [name], offer to answer any questions she may have - she's appreciative of our care.  Assessment and Plan:  Goals of Care Decided on comfort measures 05/28/2024 based on his decline.  Appreciate palliative assistance.   Acute Metabolic Encephalopathy Focal Neurologic Deficit Since the procedure 11/28 (thoracentesis), he'd been altered.  His wife notes it was around the time he got fentanyl /morphine  (which would've been around 2 PM).  Noted anisocoria (L pupil > R), right gaze preference.  Moving all extremities, but poor attention, difficult exam. Further workup deferred with decision for comfort measures, concerning for neurologic event (hemorrhage on eliquis  vs ischemic stroke vs related to metastatic colon cancer?)   Acute bronchospastic COPD exacerbation  Acute on Chronic Hypoxic Respiratory Failure Pulmonary Sarcoidosis  Checkpoint inhibitor pneumonitis Community Acquired Pneumonia  Left Hydropneumothorax  Comfort measures started 05-28-24 as above, see prior notes for  details on care   Pneumomediastinum Spontaneous and minimal without  need for acute intervention   Acute on chronic diastolic CHF exacerbation TTE noted EF 70-74% with grade 1 DD  - Cardiology following - diuresis per cardiology - RUE US  for RUE swelling - negative for DVT - net negative ~14.3L since 04/25/24    CAD Chronic atrial flutter Thrombocytopenia Elevated LFT's Hypotension Hypokalemia Hypomagnesemia Chronic anemia DM2 Hyperlipidemia Hypothyroidism Colon cancer      Procedures: see previous notes  Consultations: see prior notes  The results of significant diagnostics from this hospitalization (including imaging, microbiology, ancillary and laboratory) are listed below for reference.   Significant Diagnostic Studies: DG CHEST PORT 1 VIEW Result Date: 05/10/2024 EXAM: 1 VIEW(S) XRAY OF THE CHEST 05/10/2024 05:37:00 AM COMPARISON: Portable chest 05/09/2024 02:18 PM. CLINICAL HISTORY: Pneumothorax. FINDINGS: LINES, TUBES AND DEVICES: Right chest port catheter terminates in the distal SVC. Pigtail tube thoracostomy unchanged in positioning with the pigtail in the lateral upper left thorax. LUNGS AND PLEURA: Left hydropneumothorax with small hydro component, apical pneumo component estimated at 10% of the left chest volume, unchanged. Extensive airspace disease in the left lung. Patchy consolidation in the right base. Small right pleural effusion. The rest of the right lung is clear of infiltrate. Scattered calcified granulomas. Calcified hilar lymph nodes. Some underlying central edema may be present. HEART AND MEDIASTINUM: Cardiomegaly. Calcified mediastinal lymph nodes. Stable mediastinum. BONES AND SOFT TISSUES: No acute osseous abnormality. IMPRESSION: 1. Unchanged Left hydropneumothorax with small hydro component and apical pneumo component estimated at 10% of the left chest volume, unchanged. 2. Extensive airspace disease in the left lung and patchy consolidation in the right base. 3. Small right pleural effusion. 4. Cardiomegaly with  possible underlying central edema. Electronically signed by: Francis Quam MD 05/10/2024 06:28 AM EST RP Workstation: [id number]   DG CHEST PORT 1 VIEW Result Date: 05/09/2024 CLINICAL DATA:  [id number] Encounter for chest tube placement [id number] EXAM: PORTABLE CHEST - 1 VIEW COMPARISON:  05/09/2024 12:42 FINDINGS: Similar interstitial opacities throughout the left lung with patchy opacities in both lung bases. Blunting of both costophrenic sulci is also again unchanged. Interval placement of Jerzee Jerome percutaneous left pigtail thoracostomy tube with re-expansion of the lung and no significant pneumothorax visualized. Moderate cardiomegaly. Right chest port terminating at the cavoatrial junction, unchanged. IMPRESSION: Interval placement of Bennet Kujawa left-sided thoracostomy tube with re-expansion of the lung and no significant pneumothorax visualized. Otherwise, no significant interval change to the airspace disease and bilateral pleural effusions. Electronically Signed   By: Rogelia Myers M.D.   On: 05/09/2024 14:46   DG CHEST PORT 1 VIEW Result Date: 05/09/2024 CLINICAL DATA:  Shortness of breath EXAM: PORTABLE CHEST 1 VIEW COMPARISON:  Chest radiograph and CT chest dated 05/08/2024 FINDINGS: Lines/tubes: Right chest wall port tip projects over the SVC. Lungs: Interval improved left lung aeration. Decreased but persistent right lower and diffuse left lung opacities. Pleura: New small left pneumothorax. Similar small right pleural effusion. Decreased trace left pleural effusion. Heart/mediastinum: Similar enlarged cardiomediastinal silhouette. Bones: No acute osseous abnormality. IMPRESSION: 1. New small left pneumothorax. 2. Interval improved left lung aeration. Decreased but persistent right lower and diffuse left lung opacities. 3. Similar small right pleural effusion. Decreased trace left pleural effusion. Critical Value/emergent results were called by telephone at the time of interpretation on 05/09/2024 at 1:18 pm to  Meliton Monte, MD, who verbally acknowledged these results. Electronically Signed   By: Limin  Xu M.D.   On: 05/09/2024 13:23  CT Chest High Resolution Result Date: 05/09/2024 CLINICAL DATA:  Interstitial lung disease. EXAM: CT CHEST WITHOUT CONTRAST TECHNIQUE: Multidetector CT imaging of the chest was performed following the standard protocol without intravenous contrast. High resolution imaging of the lungs, as well as inspiratory and expiratory imaging, was performed. RADIATION DOSE REDUCTION: This exam was performed according to the departmental dose-optimization program which includes automated exposure control, adjustment of the mA and/or kV according to patient size and/or use of iterative reconstruction technique. COMPARISON:  04/17/2024, 11/21/2023. FINDINGS: Cardiovascular: Right IJ Port-Rakiyah Esch-Cath terminates in the low SVC. Atherosclerotic calcification of the aorta, aortic valve and coronary arteries. Enlarged pulmonic trunk and heart. No pericardial effusion. Mediastinum/Nodes: Calcified mediastinal and hilar lymph nodes. No pathologically enlarged mediastinal or axillary lymph nodes. Hilar regions are difficult to definitively evaluate without IV contrast. Esophagus is grossly unremarkable. Lungs/Pleura: Image quality is degraded by expiratory phase imaging and respiratory motion. Worsening airspace consolidation, peribronchovascular thickening and nodularity as well as ground-glass throughout the left lung. Similarly, worsening consolidation in the posterior segment right upper lobe and right lower lobe. Assessment for underlying interstitial lung disease is prohibitive in this setting. Small loculated right pleural effusion. Trace left pleural fluid. Debris in the airway. Upper Abdomen: Liver margin is irregular. Cholecystectomy. Visualized portions of the liver, gallbladder, adrenal glands, kidneys, spleen, pancreas, stomach and bowel are otherwise grossly unremarkable. No upper abdominal  adenopathy. Musculoskeletal: Degenerative changes in the spine. IMPRESSION: 1. Worsening multi lobar pneumonia. 2. Assessment of underlying interstitial lung disease is challenging in this setting. 3. Small loculated right pleural effusion, stable. Trace left pleural fluid. 4. Liver appears cirrhotic. 5. Aortic atherosclerosis (ICD10-I70.0). Coronary artery calcification. 6. Enlarged pulmonic trunk, indicative of pulmonary arterial hypertension. Electronically Signed   By: Newell Eke M.D.   On: 05/09/2024 10:08   VAS US  UPPER EXTREMITY VENOUS DUPLEX Result Date: 05/09/2024 UPPER VENOUS STUDY  Patient Name:  MOMIN MISKO  Date of Exam:   05/08/2024 Medical Rec #: [id number]        Accession #:    [id number] Date of Birth: November 15, 1940       Patient Gender: M Patient Age:   41 years Exam Location:  [name] Procedure:      VAS US  UPPER EXTREMITY VENOUS DUPLEX Referring Phys: Jeane Cashatt POWELL JR --------------------------------------------------------------------------------  Indications: Edema Limitations: Subcutaneous edema, port placement and poor ultrasound/tissue interface. Comparison Study: No prior UEV on file Performing Technologist: Jody Hill RVT, RDMS  Examination Guidelines: Daaiyah Baumert complete evaluation includes B-mode imaging, spectral Doppler, color Doppler, and power Doppler as needed of all accessible portions of each vessel. Bilateral testing is considered an integral part of Flavio Lindroth complete examination. Limited examinations for reoccurring indications may be performed as noted.  Right Findings: +----------+------------+---------+-----------+----------+--------------------+ RIGHT     CompressiblePhasicitySpontaneousProperties      Summary        +----------+------------+---------+-----------+----------+--------------------+ IJV           Full       Yes       Yes                                   +----------+------------+---------+-----------+----------+--------------------+ Subclavian                Yes       Yes                   patent by  color/doppler     +----------+------------+---------+-----------+----------+--------------------+ Axillary      Full       Yes       Yes                                   +----------+------------+---------+-----------+----------+--------------------+ Brachial      Full       Yes       Yes                                   +----------+------------+---------+-----------+----------+--------------------+ Radial        Full                                                       +----------+------------+---------+-----------+----------+--------------------+ Ulnar         Full                                  not well visualized  +----------+------------+---------+-----------+----------+--------------------+ Cephalic      Full                                                       +----------+------------+---------+-----------+----------+--------------------+ Basilic       Full       Yes       Yes                                   +----------+------------+---------+-----------+----------+--------------------+ Limited visualization of subclavian vein due to port placement. Limited visualization of ulnar veins due to overlying subcutaneous edema.  Left Findings: +----------+------------+---------+-----------+----------+-------+ LEFT      CompressiblePhasicitySpontaneousPropertiesSummary +----------+------------+---------+-----------+----------+-------+ Subclavian    Full       Yes       Yes                      +----------+------------+---------+-----------+----------+-------+  Summary:  Right: No evidence of deep vein thrombosis in the upper extremity. No evidence of superficial vein thrombosis in the upper extremity.  Left: No evidence of thrombosis in the subclavian.  *See table(s) above for measurements and observations.  Diagnosing physician:  Norman Serve Electronically signed by Norman Serve on 05/09/2024 at 7:14:14 AM.    Final    DG CHEST PORT 1 VIEW Result Date: 05/08/2024 CLINICAL DATA:  Hypoxia. Acute on chronic respiratory failure. Follow-up exam. EXAM: PORTABLE CHEST 1 VIEW COMPARISON:  05/01/2024 and older studies.  CT, 04/20/2024. FINDINGS: Airspace lung opacities have increased throughout the left lung and at the right lung base compared to the most recent prior exam. There are underlying interstitial lung opacities at are without significant change. Cardiac silhouette is partly obscured by contiguous left lung opacity, but grossly unchanged. Right anterior chest wall internal jugular Port-Neytiri Asche-Cath is stable. Stable bilateral hilar and mediastinal calcifications and calcified lung granulomata. Probable small effusions. IMPRESSION: 1. Interval worsening of lung aeration since the most recent prior study.  Findings consistent with multifocal pneumonia in the proper clinical setting. Electronically Signed   By: Alm Parkins M.D.   On: 05/08/2024 11:42   DG CHEST PORT 1 VIEW Result Date: 05/01/2024 EXAM: 1 VIEW(S) XRAY OF THE CHEST 05/01/2024 10:20:00 AM COMPARISON: 04/29/2024 CLINICAL HISTORY: SOB (shortness of breath) FINDINGS: LINES, TUBES AND DEVICES: Right chest port with catheter tip in stable position. LUNGS AND PLEURA: Surgical sutures over right lung base. Stable patchy airspace opacities in left lung and right lung base. Stable small bilateral pleural effusions. Calcified pulmonary nodules. No pneumothorax. HEART AND MEDIASTINUM: Calcified mediastinal and hilar lymph nodes. No acute abnormality of the cardiac and mediastinal silhouettes. BONES AND SOFT TISSUES: No acute osseous abnormality. IMPRESSION: 1. Persistent, bilateral patchy airspace opacities in the left lung and right lung base. 2. Unchanged small bilateral pleural effusions. Electronically signed by: Waddell Calk MD 05/01/2024 12:52 PM EST RP Workstation: [id number]    DG Chest 1 View Result Date: 04/29/2024 EXAM: 1 VIEW(S) XRAY OF THE CHEST 04/29/2024 01:47:00 PM COMPARISON: 04/22/2024 CLINICAL HISTORY: Follow-up exam [id number] FINDINGS: LINES, TUBES AND DEVICES: Accessed right chest port in place. LUNGS AND PLEURA: Postsurgical changes of right lower lobe. Bilateral calcified pulmonary nodules. Calcified mediastinal and hilar lymph nodes. Stable patchy left lung airspace opacities. Right bases airspace opacity. Small bilateral pleural effusions. No pneumothorax. HEART AND MEDIASTINUM: Aortic calcification present. Calcified mediastinal lymph nodes. No acute abnormality of the cardiac silhouette. BONES AND SOFT TISSUES: Decreased left supraclavicular soft tissue emphysema. No acute osseous abnormality. IMPRESSION: 1. Right basilar and left lung airspace opacities, unchanged from prior exam. 2. Small bilateral pleural effusions. Electronically signed by: Morgane Naveau MD 04/29/2024 05:34 PM EST RP Workstation: [id number]   DG CHEST PORT 1 VIEW Result Date: 04/22/2024 CLINICAL DATA:  Shortness of breath. Acute hypoxic respiratory failure. EXAM: PORTABLE CHEST 1 VIEW COMPARISON:  Radiographs 04/21/2024 and 04/20/2024.  CT 04/20/2024. FINDINGS: 0026 hours. Right IJ Port-Green Quincy-Cath projects over the mid SVC. The heart size and mediastinal contours are stable with cardiomegaly and calcified mediastinal lymph nodes. There is chronic lung disease with postsurgical changes and chronic parenchymal opacity at the right lung base. The superimposed left basilar airspace disease has not significantly changed from the recent prior studies. No pneumothorax or enlarging pleural effusion. Subcutaneous emphysema in the lower left neck has improved in the interval. No acute osseous findings are seen. IMPRESSION: 1. No significant change in left basilar airspace disease superimposed on chronic lung disease. 2. Improved subcutaneous emphysema in the lower left neck. No pneumothorax. Electronically  Signed   By: Elsie Perone M.D.   On: 04/22/2024 12:28   DG CHEST PORT 1 VIEW Result Date: 04/21/2024 CLINICAL DATA:  Shortness of breath. EXAM: PORTABLE CHEST 1 VIEW COMPARISON:  04/20/2024 FINDINGS: Right jugular porta catheter tip in the superior vena cava. Stable mildly enlarged cardiac silhouette. Mildly progressive patchy density throughout the left mid and lower lung zones. Slight increase in size of Kwinton Maahs small to moderate-sized right pleural effusion with Mandalyn Pasqua mild increase in adjacent airspace opacity. Multiple bilateral calcified granulomata are again demonstrated as well as calcified hilar and mediastinal lymph nodes. Better visualized subcutaneous emphysema in the left supraclavicular region and lower neck with no pneumothorax or residual pneumomediastinum seen. Thoracic spine degenerative changes. IMPRESSION: 1. Mildly progressive patchy density throughout the left mid and lower lung zones, compatible with pneumonia. 2. Slight increase in size of Emie Sommerfeld small to moderate-sized right pleural effusion with Azelea Seguin mild increase in adjacent airspace opacity compatible with pneumonia  or patchy atelectasis. 3. Stable mild cardiomegaly. Electronically Signed   By: Elspeth Bathe M.D.   On: 04/21/2024 14:43   DG CHEST PORT 1 VIEW Result Date: 04/20/2024 CLINICAL DATA:  Pneumothorax. EXAM: PORTABLE CHEST 1 VIEW COMPARISON:  04/18/2024 and CT chest 04/17/2024. FINDINGS: Trachea is midline. Heart is enlarged. Right IJ Port-Khaleem Burchill-Cath tip is in the SVC. Somewhat coarsened haziness in the lungs, left greater than right, unchanged. Multiple calcified mediastinal, calcified mediastinal hilar lymph nodes and calcified granulomas. Small loculated right pleural effusion. IMPRESSION: 1. No pneumothorax or appreciable residual pneumomediastinum. 2. Hazy airspace opacification in the left upper and left lower lobes may be due to pneumonia or immunotherapy related pneumonitis. 3. Pleuroparenchymal scarring in the lower right hemithorax.  Electronically Signed   By: Newell Eke M.D.   On: 04/20/2024 15:44   CT ANGIO GI BLEED Result Date: 04/20/2024 EXAM: CTA ABDOMEN AND PELVIS WITH CONTRAST 04/20/2024 06:12:30 AM TECHNIQUE: CTA images of the abdomen and pelvis without and with intravenous contrast. Three-dimensional MIP/volume rendered formations were performed. Automated exposure control, iterative reconstruction, and/or weight based adjustment of the mA/kV was utilized to reduce the radiation dose to as low as reasonably achievable. 75 mL of iohexol  (OMNIPAQUE ) 350 MG/ML injection was administered. COMPARISON: 05/28/2021 CLINICAL HISTORY: GI bleeding. FINDINGS: GI BLEED: No signs of intraluminal IV contrast extravasation into the bowel loops to indicate the site of suspected GI bleed. AORTA: Aortic atherosclerotic calcifications. No acute finding. No abdominal aortic aneurysm. No dissection. CELIAC TRUNK: No acute finding. No occlusion or significant stenosis. SUPERIOR MESENTERIC ARTERY: Moderate stenosis identified at the origin of the superior mesenteric artery which appears non-rate limiting. INFERIOR MESENTERIC ARTERY: No acute finding. No occlusion or significant stenosis. RENAL ARTERIES: Normal size and morphology bilaterally. ILIAC ARTERIES: No acute finding. No occlusion or significant stenosis. ABDOMEN/PELVIS: LOWER CHEST: Decreased volume of previously noted pneumomediastinum. Similar volume of loculated right pleural fluid collection. Progressive airspace consolidation involving the right lower lobe with diffuse air bronchograms. Progressive ground-glass attenuation and interstitial thickening throughout the right middle lobe, lingula, and left lower lobe. Calcified mediastinal and hilar lymph nodes. LIVER: Postoperative changes from partial right hepatectomy. Liver lesion within the periphery of the right lobe of the liver measures 3.8 x 2.3 cm (image 55/17). Suspicious for residual/recurrent metastatic disease. GALLBLADDER AND  BILE DUCTS: Gallbladder is surgically absent. No bile duct dilatation. SPLEEN: The spleen is within normal limits in size and appearance. PANCREAS: Pancreas is normal in size and contour without focal lesion or ductal dilatation. ADRENAL GLANDS: Bilateral adrenal glands demonstrate no acute abnormality. KIDNEYS, URETERS AND BLADDER: Right kidney angiomyolipoma of the anterior cortex of the upper pole measures 8 mm (image 77/7). No follow-up imaging recommended. No stones in the kidneys or ureters. No hydronephrosis. No perinephric or periureteral stranding. Urinary bladder is unremarkable. GI AND BOWEL: Stomach appears normal. No pathologic dilatation of the large or small bowel loops. Signs of previous right hemicolectomy with enterocolonic anastomosis. There is mild small bowel wall thickening within the right hemiabdomen, which is nonspecific but possibly related to enteritis. Sigmoid colon demonstrates diverticulosis without evidence of diverticulitis. No bowel wall thickening, pericolonic stranding, abscess, or free air. REPRODUCTIVE: Seed and plants identified within the prostate gland. PERITONEUM AND RETROPERITONEUM: No ascites or free air. No signs of peritoneal nodularity. LYMPH NODES: No lymphadenopathy. BONES AND SOFT TISSUES: Small fat-containing bilateral inguinal hernias. Nerve root stimulator is identified with generator in the left lower quadrant ventral abdominal wall. The lead terminates within the porta hepatic  region. No acute abnormality of the bones. No acute soft tissue abnormality. IMPRESSION: 1. No active gastrointestinal bleeding identified. 2. Mild small bowel wall thickening in the right hemiabdomen, possibly related to enteritis. 3. Post partial hepatectomy with signs of residual or recurrent tumor in the right lobe of the liver. Comparison with any available outside imaging would be helpful to assess disease status. 4. Unchanged loculated right pleural effusion with progressive  consolidative change in the right lower lobe 5. Progressive ground-glass attenuation throughout the right middle lobe and left lower lobe with patchy areas of airspace opacity which may reflect an inflammatory or infectious process. Superimposed pulmonary edema not excluded. 6. Pneumomediastinum, decreased from prior exam. Electronically signed by: Waddell Calk MD 04/20/2024 10:23 AM EST RP Workstation: [id number]   DG Chest Port 1 View Result Date: 04/18/2024 EXAM: 1 VIEW(S) XRAY OF THE CHEST 04/18/2024 09:54:00 AM COMPARISON: 2 days ago. CLINICAL HISTORY: Pneumomediastinum (HCC). FINDINGS: LUNGS AND PLEURA: Stable bilateral lung opacities, left greater than right, concerning for pneumonia. Stable small right pleural effusion. No pulmonary edema. No pneumothorax. HEART AND MEDIASTINUM: No acute abnormality of the cardiac and mediastinal silhouettes. BONES AND SOFT TISSUES: No acute osseous abnormality. IMPRESSION: 1. Stable bilateral pulmonary opacities, left greater than right, suspicious for pneumonia. 2. Stable small right pleural effusion. Electronically signed by: Lynwood Seip MD 04/18/2024 10:21 AM EST RP Workstation: [id number]   CT Angio Chest Pulmonary Embolism (PE) W or WO Contrast Result Date: 04/17/2024 CLINICAL DATA:  Hypoxia.  Concern for pulmonary. EXAM: CT ANGIOGRAPHY CHEST WITH CONTRAST TECHNIQUE: Multidetector CT imaging of the chest was performed using the standard protocol during bolus administration of intravenous contrast. Multiplanar CT image reconstructions and MIPs were obtained to evaluate the vascular anatomy. RADIATION DOSE REDUCTION: This exam was performed according to the departmental dose-optimization program which includes automated exposure control, adjustment of the mA and/or kV according to patient size and/or use of iterative reconstruction technique. CONTRAST:  75mL OMNIPAQUE  IOHEXOL  350 MG/ML SOLN COMPARISON:  Chest CT dated 04/07/2024. FINDINGS: Cardiovascular: Mild  cardiomegaly. No pericardial effusion. There is mild atherosclerotic calcification of the thoracic aorta. No intimal dilatation or dissection. No pulmonary artery embolus identified. Mediastinum/Nodes: Calcified hilar and mediastinal granuloma. The esophagus is grossly unremarkable. No mediastinal fluid collection. There is pneumomediastinum of indeterminate etiology. Right-sided [place]-Cath with tip in the right atrium. Lungs/Pleura: Right lung base post treatment changes/scarring similar to prior CT. There is Gennell How small loculated right pleural effusion. Diffuse ground-glass density throughout the left lung similar to prior CT. Several scattered calcified granuloma noted. No pneumothorax. The central airways are patent. Upper Abdomen: No acute abnormality. Musculoskeletal: Osteopenia with degenerative changes of the spine. No acute osseous pathology. Review of the MIP images confirms the above findings. IMPRESSION: 1. No CT evidence of pulmonary artery embolus. 2. Pneumomediastinum of indeterminate etiology. 3. No significant interval change in the appearance of the lungs since the prior CT. 4.  Aortic Atherosclerosis (ICD10-I70.0). These results will be called to the ordering clinician or representative by the Radiologist Assistant, and communication documented in the PACS or [employer]. Electronically Signed   By: Vanetta Chou M.D.   On: 04/17/2024 16:27   DG Chest Port 1 View Result Date: 04/16/2024 EXAM: 1 VIEW(S) XRAY OF THE CHEST 04/16/2024 08:29:00 AM COMPARISON: Comparison 9 days ago (04/07/2024). CLINICAL HISTORY: Shortness of breath. FINDINGS: LINES, TUBES AND DEVICES: Foley catheter is present and likely complicates the cardiomediastinal silhouette. LUNGS AND PLEURA: Increased left lung opacities noted concerning for worsening pneumonia or  asymmetric edema. Stable right basilar opacity consistent scarring or post treatment change. Stable small loculated pleural effusion or full thickening in  the right lung base. No pneumothorax. HEART AND MEDIASTINUM: Stable cardiomediastinal silhouette likely complicated by foley catheter is unchanged. BONES AND SOFT TISSUES: No acute osseous abnormality. IMPRESSION: 1. Increased left lung opacities, concerning for worsening pneumonia or asymmetric edema. 2. Stable right basilar opacity, consistent with scarring or post-treatment change. 3. Stable small loculated right basilar pleural effusion or pleural thickening. Electronically signed by: Lynwood Seip MD 04/16/2024 08:35 AM EST RP Workstation: [id number]    Microbiology: No results found for this or any previous visit (from the past 240 hours).  Time spent: 20 minutes  Signed: Meliton Monte, MD 04/25/2024

## 2024-05-12 DEATH — deceased

## 2024-05-14 ENCOUNTER — Ambulatory Visit: Admitting: Cardiology

## 2024-05-14 LAB — FUNGUS CULTURE WITH STAIN

## 2024-05-14 LAB — FUNGAL ORGANISM REFLEX

## 2024-05-14 LAB — FUNGUS CULTURE RESULT

## 2024-05-26 LAB — ACID FAST CULTURE WITH REFLEXED SENSITIVITIES (MYCOBACTERIA): Acid Fast Culture: NEGATIVE

## 2024-06-30 ENCOUNTER — Ambulatory Visit: Admitting: Pulmonary Disease
